# Patient Record
Sex: Male | Born: 1945
Health system: Southern US, Community
[De-identification: ages and names within clinical notes are randomized; demographics above are authoritative.]

## PROBLEM LIST (undated history)

## (undated) DIAGNOSIS — I5023 Acute on chronic systolic (congestive) heart failure: Secondary | ICD-10-CM

## (undated) DIAGNOSIS — F1910 Other psychoactive substance abuse, uncomplicated: Secondary | ICD-10-CM

## (undated) DIAGNOSIS — I779 Disorder of arteries and arterioles, unspecified: Secondary | ICD-10-CM

## (undated) DIAGNOSIS — N289 Disorder of kidney and ureter, unspecified: Secondary | ICD-10-CM

## (undated) DIAGNOSIS — N186 End stage renal disease: Secondary | ICD-10-CM

## (undated) DIAGNOSIS — C181 Malignant neoplasm of appendix: Secondary | ICD-10-CM

## (undated) DIAGNOSIS — E119 Type 2 diabetes mellitus without complications: Secondary | ICD-10-CM

## (undated) DIAGNOSIS — G4733 Obstructive sleep apnea (adult) (pediatric): Secondary | ICD-10-CM

## (undated) DIAGNOSIS — T4145XA Adverse effect of unspecified anesthetic, initial encounter: Secondary | ICD-10-CM

## (undated) DIAGNOSIS — I639 Cerebral infarction, unspecified: Secondary | ICD-10-CM

## (undated) DIAGNOSIS — C4490 Unspecified malignant neoplasm of skin, unspecified: Secondary | ICD-10-CM

## (undated) DIAGNOSIS — I429 Cardiomyopathy, unspecified: Secondary | ICD-10-CM

## (undated) DIAGNOSIS — T8859XA Other complications of anesthesia, initial encounter: Secondary | ICD-10-CM

## (undated) DIAGNOSIS — E785 Hyperlipidemia, unspecified: Secondary | ICD-10-CM

## (undated) DIAGNOSIS — I10 Essential (primary) hypertension: Secondary | ICD-10-CM

## (undated) DIAGNOSIS — G709 Myoneural disorder, unspecified: Secondary | ICD-10-CM

## (undated) DIAGNOSIS — G459 Transient cerebral ischemic attack, unspecified: Secondary | ICD-10-CM

## (undated) DIAGNOSIS — I739 Peripheral vascular disease, unspecified: Secondary | ICD-10-CM

## (undated) HISTORY — DX: Obstructive sleep apnea (adult) (pediatric): G47.33

## (undated) HISTORY — DX: Hyperlipidemia, unspecified: E78.5

## (undated) HISTORY — PX: OTHER SURGICAL HISTORY: SHX169

## (undated) HISTORY — DX: Cerebral infarction, unspecified: I63.9

## (undated) HISTORY — DX: Malignant neoplasm of appendix: C18.1

## (undated) HISTORY — DX: Other psychoactive substance abuse, uncomplicated: F19.10

## (undated) HISTORY — PX: CARDIAC CATHETERIZATION: SHX172

## (undated) HISTORY — DX: Disorder of arteries and arterioles, unspecified: I77.9

## (undated) HISTORY — DX: Peripheral vascular disease, unspecified: I73.9

## (undated) HISTORY — DX: End stage renal disease: N18.6

## (undated) HISTORY — DX: Essential (primary) hypertension: I10

## (undated) HISTORY — DX: Cardiomyopathy, unspecified: I42.9

## (undated) HISTORY — DX: Acute on chronic systolic (congestive) heart failure: I50.23

## (undated) HISTORY — DX: Unspecified malignant neoplasm of skin, unspecified: C44.90

## (undated) HISTORY — DX: Transient cerebral ischemic attack, unspecified: G45.9

---
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
## 2002-05-24 HISTORY — PX: OTHER SURGICAL HISTORY: SHX169

## 2004-05-24 DIAGNOSIS — C181 Malignant neoplasm of appendix: Secondary | ICD-10-CM

## 2004-05-24 HISTORY — DX: Malignant neoplasm of appendix: C18.1

## 2004-05-24 HISTORY — PX: RENAL CRYOABLATION: SHX2322

## 2004-06-09 ENCOUNTER — Inpatient Hospital Stay: Payer: Self-pay | Admitting: Urology

## 2005-03-08 ENCOUNTER — Ambulatory Visit: Payer: Self-pay | Admitting: Urology

## 2006-02-09 ENCOUNTER — Ambulatory Visit: Payer: Self-pay | Admitting: Urology

## 2006-05-24 LAB — HM COLONOSCOPY: HM Colonoscopy: NORMAL

## 2006-08-17 ENCOUNTER — Ambulatory Visit: Payer: Self-pay | Admitting: Gastroenterology

## 2007-03-17 ENCOUNTER — Ambulatory Visit: Payer: Self-pay | Admitting: Urology

## 2007-06-25 DIAGNOSIS — I739 Peripheral vascular disease, unspecified: Secondary | ICD-10-CM

## 2007-06-25 HISTORY — DX: Peripheral vascular disease, unspecified: I73.9

## 2007-07-04 ENCOUNTER — Ambulatory Visit: Payer: Self-pay | Admitting: Cardiovascular Disease

## 2007-07-14 ENCOUNTER — Ambulatory Visit: Payer: Self-pay | Admitting: Cardiovascular Disease

## 2008-03-21 ENCOUNTER — Ambulatory Visit: Payer: Self-pay | Admitting: Urology

## 2008-10-07 ENCOUNTER — Ambulatory Visit: Payer: Self-pay | Admitting: Urology

## 2009-08-29 ENCOUNTER — Ambulatory Visit: Payer: Self-pay | Admitting: Urology

## 2009-10-08 ENCOUNTER — Ambulatory Visit: Payer: Self-pay | Admitting: Pain Medicine

## 2011-04-18 ENCOUNTER — Other Ambulatory Visit: Payer: Self-pay | Admitting: Internal Medicine

## 2011-04-19 NOTE — Telephone Encounter (Signed)
Refills authorized  On amlodipine and lisinopril  #30 with 6 refills each

## 2011-04-22 ENCOUNTER — Other Ambulatory Visit: Payer: Self-pay | Admitting: Internal Medicine

## 2011-04-22 MED ORDER — LISINOPRIL 40 MG PO TABS: 40.0000 mg | ORAL_TABLET | Freq: Every day | ORAL | Status: DC

## 2011-05-06 ENCOUNTER — Encounter: Payer: Self-pay | Admitting: Internal Medicine

## 2011-05-06 ENCOUNTER — Ambulatory Visit (INDEPENDENT_AMBULATORY_CARE_PROVIDER_SITE_OTHER): Payer: Medicare Other | Admitting: Internal Medicine

## 2011-05-06 VITALS — BP 168/72 | HR 83 | Temp 98.2°F | Resp 16 | Ht 67.0 in | Wt 171.5 lb

## 2011-05-06 DIAGNOSIS — Z85528 Personal history of other malignant neoplasm of kidney: Secondary | ICD-10-CM | POA: Insufficient documentation

## 2011-05-06 DIAGNOSIS — Z7189 Other specified counseling: Secondary | ICD-10-CM | POA: Insufficient documentation

## 2011-05-06 DIAGNOSIS — I701 Atherosclerosis of renal artery: Secondary | ICD-10-CM | POA: Insufficient documentation

## 2011-05-06 DIAGNOSIS — Z79899 Other long term (current) drug therapy: Secondary | ICD-10-CM

## 2011-05-06 DIAGNOSIS — I10 Essential (primary) hypertension: Secondary | ICD-10-CM

## 2011-05-06 DIAGNOSIS — C649 Malignant neoplasm of unspecified kidney, except renal pelvis: Secondary | ICD-10-CM

## 2011-05-06 DIAGNOSIS — E785 Hyperlipidemia, unspecified: Secondary | ICD-10-CM

## 2011-05-06 DIAGNOSIS — N182 Chronic kidney disease, stage 2 (mild): Secondary | ICD-10-CM

## 2011-05-06 DIAGNOSIS — Z716 Tobacco abuse counseling: Secondary | ICD-10-CM

## 2011-05-06 DIAGNOSIS — I129 Hypertensive chronic kidney disease with stage 1 through stage 4 chronic kidney disease, or unspecified chronic kidney disease: Secondary | ICD-10-CM | POA: Insufficient documentation

## 2011-05-06 DIAGNOSIS — Z23 Encounter for immunization: Secondary | ICD-10-CM

## 2011-05-06 LAB — BASIC METABOLIC PANEL
BUN: 16 mg/dL (ref 6–23)
CO2: 25 mEq/L (ref 19–32)
Calcium: 9.3 mg/dL (ref 8.4–10.5)
Chloride: 107 mEq/L (ref 96–112)
Creatinine, Ser: 1.4 mg/dL (ref 0.4–1.5)
GFR: 53.1 mL/min — ABNORMAL LOW (ref >60.00)
Glucose, Bld: 93 mg/dL (ref 70–99)
Potassium: 4.2 mEq/L (ref 3.5–5.1)
Sodium: 138 mEq/L (ref 135–145)

## 2011-05-06 LAB — MICROALBUMIN / CREATININE URINE RATIO
Creatinine,U: 43.8 mg/dL
Microalb Creat Ratio: 46.2 mg/g — ABNORMAL HIGH (ref 0.0–30.0)
Microalb, Ur: 20.2 mg/dL — ABNORMAL HIGH (ref 0.0–1.9)

## 2011-05-06 MED ORDER — NEBIVOLOL HCL 5 MG PO TABS: 5.0000 mg | ORAL_TABLET | Freq: Every day | ORAL | Status: DC

## 2011-05-06 MED ORDER — ZOSTER VACCINE LIVE 19400 UNT/0.65ML ~~LOC~~ SOLR: 0.6500 mL | Freq: Once | SUBCUTANEOUS | Status: AC

## 2011-05-06 NOTE — Patient Instructions (Addendum)
We are adding Bystolic 5 mg daily to your current medications .Marland Kitchen  Continue amlodipine and lisinopril  .    Goal is bp < 130/80   Return in January for fasting labs and repeat bp check   You need to quit smoking to decrease your risks of recurrent cancer,  And heart attacks/strokes

## 2011-05-06 NOTE — Assessment & Plan Note (Signed)
His last fasting lipid panel noted low HDL and elevated tirglycerides but he deferred treatment.  Will repeat in one month and recommend fenofibrate if indicated,

## 2011-05-06 NOTE — Progress Notes (Signed)
  Subjective:    Patient ID: [name], male    DOB: [birth year], 65 y.o.   MRN: [id number]  HPI  [name] is a 65 yo white male with uncontrolled htn, surgically treated OSA by [facility] via [place], unclear history of renal artery stenosis and nd ongoing tobacco abuse presents for 6 month follow up on HTN .  He was seen as a new patient in June with bp of 210/102 after running of out amlodipine 3 weeks prior.  Was started on bystolic 10 mg at that time and per patient his blood pressure improved for a while. However he presents today with no recent use of bystolic but has been taking amlodiipne and lisinopril 40 mg daily  And recent systolics are in the [id number]. He denies chest pain, dyspnea, LE pain.     Review of Systems  Constitutional: Negative for fever, chills, diaphoresis, activity change, appetite change, fatigue and unexpected weight change.  HENT: Negative for hearing loss, ear pain, nosebleeds, congestion, sore throat, facial swelling, rhinorrhea, sneezing, drooling, mouth sores, trouble swallowing, neck pain, neck stiffness, dental problem, voice change, postnasal drip, sinus pressure, tinnitus and ear discharge.   Eyes: Negative for photophobia, pain, discharge, redness, itching and visual disturbance.  Respiratory: Negative for apnea, cough, choking, chest tightness, shortness of breath, wheezing and stridor.   Cardiovascular: Negative for chest pain, palpitations and leg swelling.  Gastrointestinal: Negative for nausea, vomiting, abdominal pain, diarrhea, constipation, blood in stool, abdominal distention, anal bleeding and rectal pain.  Genitourinary: Negative for dysuria, urgency, frequency, hematuria, flank pain, decreased urine volume, scrotal swelling, difficulty urinating and testicular pain.  Musculoskeletal: Negative for myalgias, back pain, joint swelling, arthralgias and gait problem.  Skin: Negative for color change, rash and wound.  Neurological: Positive for  light-headedness. Negative for dizziness, tremors, seizures, syncope, speech difficulty, weakness, numbness and headaches.  Psychiatric/Behavioral: Negative for suicidal ideas, hallucinations, behavioral problems, confusion, sleep disturbance, dysphoric mood, decreased concentration and agitation. The patient is not nervous/anxious.        Objective:   Physical Exam  Constitutional: He is oriented to person, place, and time.  HENT:  Head: Normocephalic and atraumatic.  Mouth/Throat: Oropharynx is clear and moist.  Eyes: Conjunctivae and EOM are normal.  Neck: Normal range of motion. Neck supple. No JVD present. No thyromegaly present.  Cardiovascular: Normal rate, regular rhythm and normal heart sounds.   Pulmonary/Chest: Effort normal and breath sounds normal. He has no wheezes. He has no rales.  Abdominal: Soft. Bowel sounds are normal. He exhibits no mass. There is no tenderness. There is no rebound.  Musculoskeletal: Normal range of motion. He exhibits no edema.  Neurological: He is alert and oriented to person, place, and time.  Skin: Skin is warm and dry.  Psychiatric: He has a normal mood and affect.          Assessment & Plan:

## 2011-05-06 NOTE — Assessment & Plan Note (Signed)
His kideny funciton is normal today while on lisinopril suggesting that if he does have RAS it is certainly not bilateral .  Will continue lisinopril given proteinuria since on UA today and and resume bystolic 5 mg daily.

## 2011-05-06 NOTE — Assessment & Plan Note (Addendum)
S/p left heminephrectomy in 2004, recurrence on the right side with cryoablation done in 2006.  His nephrologist is Dr. Elin[facility].  We did not have time to discuss when his last followup was  today but when he returns in one month we will repeat his UA and discuss local followup.

## 2011-05-14 ENCOUNTER — Encounter: Payer: Self-pay | Admitting: *Deleted

## 2011-06-11 ENCOUNTER — Ambulatory Visit: Payer: Medicare Other

## 2011-06-30 ENCOUNTER — Other Ambulatory Visit (INDEPENDENT_AMBULATORY_CARE_PROVIDER_SITE_OTHER): Payer: Medicare Other | Admitting: *Deleted

## 2011-06-30 DIAGNOSIS — E785 Hyperlipidemia, unspecified: Secondary | ICD-10-CM | POA: Diagnosis not present

## 2011-06-30 DIAGNOSIS — C649 Malignant neoplasm of unspecified kidney, except renal pelvis: Secondary | ICD-10-CM

## 2011-06-30 LAB — HEPATIC FUNCTION PANEL
ALT: 15 U/L (ref 0–53)
AST: 15 U/L (ref 0–37)
Albumin: 3.9 g/dL (ref 3.5–5.2)
Alkaline Phosphatase: 139 U/L — ABNORMAL HIGH (ref 39–117)
Bilirubin, Direct: 0 mg/dL (ref 0.0–0.3)
Total Bilirubin: 0.7 mg/dL (ref 0.3–1.2)
Total Protein: 7.4 g/dL (ref 6.0–8.3)

## 2011-06-30 LAB — LIPID PANEL
Cholesterol: 197 mg/dL (ref 0–200)
HDL: 24.8 mg/dL — ABNORMAL LOW (ref >39.00)
Total CHOL/HDL Ratio: 8
Triglycerides: 215 mg/dL — ABNORMAL HIGH (ref 0.0–149.0)
VLDL: 43 mg/dL — ABNORMAL HIGH (ref 0.0–40.0)

## 2011-06-30 LAB — LDL CHOLESTEROL, DIRECT: Direct LDL: 129.8 mg/dL

## 2011-07-01 LAB — URINALYSIS
Bilirubin Urine: NEGATIVE
Glucose, UA: NEGATIVE mg/dL
Hgb urine dipstick: NEGATIVE
Ketones, ur: NEGATIVE mg/dL
Leukocytes, UA: NEGATIVE
Nitrite: NEGATIVE
Protein, ur: 30 mg/dL — AB
Specific Gravity, Urine: 1.01 (ref 1.005–1.030)
Urobilinogen, UA: 0.2 mg/dL (ref 0.0–1.0)
pH: 6 (ref 5.0–8.0)

## 2011-08-12 ENCOUNTER — Encounter: Payer: Self-pay | Admitting: Internal Medicine

## 2011-09-27 ENCOUNTER — Ambulatory Visit: Payer: Medicare Other | Admitting: Internal Medicine

## 2011-09-30 ENCOUNTER — Other Ambulatory Visit (INDEPENDENT_AMBULATORY_CARE_PROVIDER_SITE_OTHER): Payer: Medicare Other | Admitting: *Deleted

## 2011-09-30 DIAGNOSIS — E785 Hyperlipidemia, unspecified: Secondary | ICD-10-CM | POA: Diagnosis not present

## 2011-09-30 LAB — LIPID PANEL
Cholesterol: 176 mg/dL (ref 0–200)
HDL: 28 mg/dL — ABNORMAL LOW (ref >39.00)
LDL Cholesterol: 115 mg/dL — ABNORMAL HIGH (ref 0–99)
Total CHOL/HDL Ratio: 6
Triglycerides: 167 mg/dL — ABNORMAL HIGH (ref 0.0–149.0)
VLDL: 33.4 mg/dL (ref 0.0–40.0)

## 2011-09-30 LAB — HEPATIC FUNCTION PANEL
ALT: 14 U/L (ref 0–53)
AST: 13 U/L (ref 0–37)
Albumin: 3.5 g/dL (ref 3.5–5.2)
Alkaline Phosphatase: 118 U/L — ABNORMAL HIGH (ref 39–117)
Bilirubin, Direct: 0 mg/dL (ref 0.0–0.3)
Total Bilirubin: 0.8 mg/dL (ref 0.3–1.2)
Total Protein: 6.8 g/dL (ref 6.0–8.3)

## 2011-10-30 ENCOUNTER — Other Ambulatory Visit: Payer: Self-pay | Admitting: Internal Medicine

## 2011-12-14 ENCOUNTER — Other Ambulatory Visit: Payer: Self-pay | Admitting: Internal Medicine

## 2011-12-15 ENCOUNTER — Other Ambulatory Visit: Payer: Self-pay | Admitting: Internal Medicine

## 2011-12-15 MED ORDER — LISINOPRIL 40 MG PO TABS: 40.0000 mg | ORAL_TABLET | Freq: Every day | ORAL | Status: DC

## 2011-12-23 DIAGNOSIS — G459 Transient cerebral ischemic attack, unspecified: Secondary | ICD-10-CM

## 2011-12-23 HISTORY — DX: Transient cerebral ischemic attack, unspecified: G45.9

## 2012-01-03 ENCOUNTER — Observation Stay: Payer: Self-pay | Admitting: Internal Medicine

## 2012-01-03 DIAGNOSIS — I6529 Occlusion and stenosis of unspecified carotid artery: Secondary | ICD-10-CM | POA: Diagnosis not present

## 2012-01-03 DIAGNOSIS — G459 Transient cerebral ischemic attack, unspecified: Secondary | ICD-10-CM | POA: Diagnosis not present

## 2012-01-03 DIAGNOSIS — F17200 Nicotine dependence, unspecified, uncomplicated: Secondary | ICD-10-CM | POA: Diagnosis not present

## 2012-01-03 DIAGNOSIS — I498 Other specified cardiac arrhythmias: Secondary | ICD-10-CM | POA: Diagnosis not present

## 2012-01-03 DIAGNOSIS — R5383 Other fatigue: Secondary | ICD-10-CM | POA: Diagnosis not present

## 2012-01-03 DIAGNOSIS — Z823 Family history of stroke: Secondary | ICD-10-CM | POA: Diagnosis not present

## 2012-01-03 DIAGNOSIS — I10 Essential (primary) hypertension: Secondary | ICD-10-CM | POA: Diagnosis not present

## 2012-01-03 DIAGNOSIS — Z7982 Long term (current) use of aspirin: Secondary | ICD-10-CM | POA: Diagnosis not present

## 2012-01-03 DIAGNOSIS — I6350 Cerebral infarction due to unspecified occlusion or stenosis of unspecified cerebral artery: Secondary | ICD-10-CM | POA: Diagnosis not present

## 2012-01-03 DIAGNOSIS — Z23 Encounter for immunization: Secondary | ICD-10-CM | POA: Diagnosis not present

## 2012-01-03 DIAGNOSIS — N179 Acute kidney failure, unspecified: Secondary | ICD-10-CM | POA: Diagnosis not present

## 2012-01-03 DIAGNOSIS — R209 Unspecified disturbances of skin sensation: Secondary | ICD-10-CM | POA: Diagnosis not present

## 2012-01-03 DIAGNOSIS — R5381 Other malaise: Secondary | ICD-10-CM | POA: Diagnosis not present

## 2012-01-03 LAB — COMPREHENSIVE METABOLIC PANEL
Albumin: 3.7 g/dL (ref 3.4–5.0)
Alkaline Phosphatase: 179 U/L — ABNORMAL HIGH (ref 50–136)
Anion Gap: 4 — ABNORMAL LOW (ref 7–16)
BUN: 13 mg/dL (ref 7–18)
Bilirubin,Total: 0.5 mg/dL (ref 0.2–1.0)
Calcium, Total: 8.9 mg/dL (ref 8.5–10.1)
Chloride: 109 mmol/L — ABNORMAL HIGH (ref 98–107)
Co2: 28 mmol/L (ref 21–32)
Creatinine: 1.57 mg/dL — ABNORMAL HIGH (ref 0.60–1.30)
EGFR (African American): 52 — ABNORMAL LOW
EGFR (Non-African Amer.): 45 — ABNORMAL LOW
Glucose: 105 mg/dL — ABNORMAL HIGH (ref 65–99)
Osmolality: 282 (ref 275–301)
Potassium: 4.1 mmol/L (ref 3.5–5.1)
SGOT(AST): 18 U/L (ref 15–37)
SGPT (ALT): 19 U/L (ref 12–78)
Sodium: 141 mmol/L (ref 136–145)
Total Protein: 8 g/dL (ref 6.4–8.2)

## 2012-01-03 LAB — URINALYSIS, COMPLETE
Bacteria: NONE SEEN
Bilirubin,UR: NEGATIVE
Blood: NEGATIVE
Glucose,UR: NEGATIVE mg/dL (ref 0–75)
Ketone: NEGATIVE
Leukocyte Esterase: NEGATIVE
Nitrite: NEGATIVE
Ph: 6 (ref 4.5–8.0)
Protein: NEGATIVE
RBC,UR: NONE SEEN /HPF (ref 0–5)
Specific Gravity: 1.004 (ref 1.003–1.030)
Squamous Epithelial: NONE SEEN
WBC UR: NONE SEEN /HPF (ref 0–5)

## 2012-01-03 LAB — CBC WITH DIFFERENTIAL/PLATELET
Basophil #: 0 10*3/uL (ref 0.0–0.1)
Basophil %: 0.4 %
Eosinophil #: 0.3 10*3/uL (ref 0.0–0.7)
Eosinophil %: 2.3 %
HCT: 39.4 % — ABNORMAL LOW (ref 40.0–52.0)
HGB: 13.8 g/dL (ref 13.0–18.0)
Lymphocyte #: 1.6 10*3/uL (ref 1.0–3.6)
Lymphocyte %: 13.7 %
MCH: 29.6 pg (ref 26.0–34.0)
MCHC: 35.1 g/dL (ref 32.0–36.0)
MCV: 84 fL (ref 80–100)
Monocyte #: 0.5 x10 3/mm (ref 0.2–1.0)
Monocyte %: 4.6 %
Neutrophil #: 9.2 10*3/uL — ABNORMAL HIGH (ref 1.4–6.5)
Neutrophil %: 79 %
Platelet: 220 10*3/uL (ref 150–440)
RBC: 4.67 10*6/uL (ref 4.40–5.90)
RDW: 13.6 % (ref 11.5–14.5)
WBC: 11.7 10*3/uL — ABNORMAL HIGH (ref 3.8–10.6)

## 2012-01-03 LAB — PROTIME-INR
INR: 0.9
Prothrombin Time: 12.4 secs (ref 11.5–14.7)

## 2012-01-03 LAB — TROPONIN I: Troponin-I: <0.02 ng/mL

## 2012-01-04 ENCOUNTER — Telehealth: Payer: Self-pay | Admitting: Internal Medicine

## 2012-01-04 DIAGNOSIS — N179 Acute kidney failure, unspecified: Secondary | ICD-10-CM | POA: Diagnosis not present

## 2012-01-04 DIAGNOSIS — I517 Cardiomegaly: Secondary | ICD-10-CM

## 2012-01-04 DIAGNOSIS — G459 Transient cerebral ischemic attack, unspecified: Secondary | ICD-10-CM | POA: Diagnosis not present

## 2012-01-04 DIAGNOSIS — I10 Essential (primary) hypertension: Secondary | ICD-10-CM | POA: Diagnosis not present

## 2012-01-04 DIAGNOSIS — F17200 Nicotine dependence, unspecified, uncomplicated: Secondary | ICD-10-CM | POA: Diagnosis not present

## 2012-01-04 LAB — BASIC METABOLIC PANEL
Anion Gap: 9 (ref 7–16)
BUN: 11 mg/dL (ref 7–18)
Calcium, Total: 8.6 mg/dL (ref 8.5–10.1)
Chloride: 110 mmol/L — ABNORMAL HIGH (ref 98–107)
Co2: 24 mmol/L (ref 21–32)
Creatinine: 1.34 mg/dL — ABNORMAL HIGH (ref 0.60–1.30)
EGFR (African American): >60
EGFR (Non-African Amer.): 55 — ABNORMAL LOW
Glucose: 101 mg/dL — ABNORMAL HIGH (ref 65–99)
Osmolality: 285 (ref 275–301)
Potassium: 3.8 mmol/L (ref 3.5–5.1)
Sodium: 143 mmol/L (ref 136–145)

## 2012-01-04 LAB — LIPID PANEL
Cholesterol: 159 mg/dL (ref 0–200)
HDL Cholesterol: 17 mg/dL — ABNORMAL LOW (ref 40–60)
Ldl Cholesterol, Calc: 98 mg/dL (ref 0–100)
Triglycerides: 218 mg/dL — ABNORMAL HIGH (ref 0–200)
VLDL Cholesterol, Calc: 44 mg/dL — ABNORMAL HIGH (ref 5–40)

## 2012-01-04 NOTE — Telephone Encounter (Signed)
[facility] hospital discharge 01/04/12  Follow up appointment 01/14/12

## 2012-01-04 NOTE — Telephone Encounter (Signed)
Left message asking patient to return my call.

## 2012-01-05 NOTE — Telephone Encounter (Signed)
Patient called back, he stated he is feeling a little better everyday since being discharged.  He was advised to bring his medications and discharge summary from the hospital to the appt. And will call the office if he needs anything until then.

## 2012-01-14 ENCOUNTER — Ambulatory Visit (INDEPENDENT_AMBULATORY_CARE_PROVIDER_SITE_OTHER): Payer: Medicare Other | Admitting: Internal Medicine

## 2012-01-14 ENCOUNTER — Ambulatory Visit (INDEPENDENT_AMBULATORY_CARE_PROVIDER_SITE_OTHER)
Admission: RE | Admit: 2012-01-14 | Discharge: 2012-01-14 | Disposition: A | Discharge status: Home / Self Care | Payer: Medicare Other | Source: Ambulatory Visit | Attending: Internal Medicine | Admitting: Internal Medicine

## 2012-01-14 ENCOUNTER — Encounter: Payer: Self-pay | Admitting: Internal Medicine

## 2012-01-14 VITALS — BP 140/68 | HR 64 | Temp 97.9°F | Resp 14 | Wt 155.8 lb

## 2012-01-14 DIAGNOSIS — M549 Dorsalgia, unspecified: Secondary | ICD-10-CM

## 2012-01-14 DIAGNOSIS — M25552 Pain in left hip: Secondary | ICD-10-CM

## 2012-01-14 DIAGNOSIS — I10 Essential (primary) hypertension: Secondary | ICD-10-CM

## 2012-01-14 DIAGNOSIS — M545 Low back pain, unspecified: Secondary | ICD-10-CM | POA: Diagnosis not present

## 2012-01-14 DIAGNOSIS — G459 Transient cerebral ischemic attack, unspecified: Secondary | ICD-10-CM

## 2012-01-14 DIAGNOSIS — I739 Peripheral vascular disease, unspecified: Secondary | ICD-10-CM

## 2012-01-14 DIAGNOSIS — E785 Hyperlipidemia, unspecified: Secondary | ICD-10-CM

## 2012-01-14 DIAGNOSIS — Z79899 Other long term (current) drug therapy: Secondary | ICD-10-CM

## 2012-01-14 DIAGNOSIS — M169 Osteoarthritis of hip, unspecified: Secondary | ICD-10-CM | POA: Diagnosis not present

## 2012-01-14 DIAGNOSIS — M25559 Pain in unspecified hip: Secondary | ICD-10-CM | POA: Diagnosis not present

## 2012-01-14 DIAGNOSIS — R29898 Other symptoms and signs involving the musculoskeletal system: Secondary | ICD-10-CM

## 2012-01-14 NOTE — Progress Notes (Signed)
Patient ID: [name], male   DOB: 1945/12/13, 66 y.o.   MRN: [id number]  Patient Active Problem List  Diagnosis  . Hypertension goal BP (blood pressure) < 130/80  . Hyperlipidemia  . Renal cell carcinoma  . Tobacco abuse counseling  . Claudication of gluteal region  . TIA (transient ischemic attack)    Subjective:  CC:   Chief Complaint  Patient presents with  . Follow-up    hospital    HPI:   [name] a 66 y.o. male who presents for hospital followup discharged 8/13 from TIA admission . symptoms were left facial numbness,  No weakness in face and arm.  But left foot and leg became suddenly numb and weak.  MRI not done because of metal in head.  CT head was normal.  carotids and ECHO were noncontributory.  Feels poorly since then,  Had a repeat episodes of left leg weakness occurring the same way 3 days ago while mowing grass. Symptoms improve with rest.     Past Medical History  Diagnosis Date  . Migraine     cluster  . Obstructive sleep apnea   . Hyperlipidemia   . Hypertension   . Renal cell carcinoma 2004    left kidney heminephrectomy  . tobacco abuse     Past Surgical History  Procedure Date  . Heminephrectomy 2004    for renal cell CA         The following portions of the patient's history were reviewed and updated as appropriate: Allergies, current medications, and problem list.    Review of Systems:   12 Pt  review of systems was negative except those addressed in the HPI,     History   Social History  . Marital Status: Married    Spouse Name: N/A    Number of Children: N/A  . Years of Education: N/A   Occupational History  . Not on file.   Social History Main Topics  . Smoking status: Current Everyday Smoker -- 1.5 packs/day    Types: Cigarettes  . Smokeless tobacco: Never Used  . Alcohol Use: Yes  . Drug Use: No  . Sexually Active: Not on file   Other Topics Concern  . Not on file   Social History Narrative  .  No narrative on file    Objective:  BP 140/68  Pulse 64  Temp 97.9 F (36.6 C) (Oral)  Resp 14  Wt 155 lb 12 oz (70.648 kg)  SpO2 98%  General appearance: alert, cooperative and appears stated age Ears: normal TM's and external ear canals both ears Throat: lips, mucosa, and tongue normal; teeth and gums normal Neck: no adenopathy, no carotid bruit, supple, symmetrical, trachea midline and thyroid not enlarged, symmetric, no tenderness/mass/nodules Back: symmetric, no curvature. ROM normal. No CVA tenderness. Lungs: clear to auscultation bilaterally Heart: regular rate and rhythm, S1, S2 normal, no murmur, click, rub or gallop Abdomen: soft, non-tender; bowel sounds normal; no masses,  no organomegaly Pulses: 2+ and symmetric Skin: Skin color, texture, turgor normal. No rashes or lesions Lymph nodes: Cervical, supraclavicular, and axillary nodes normal.  Assessment and Plan:  Hypertension goal BP (blood pressure) < 130/80 Home bps have been 115 to 130 / 60.  NO changes today.   Claudication of gluteal region Suspected, given symptoms of leg weakness with exertion. Referral to [facility] for evaluation of lower extremity claudication symptoms.   Hyperlipidemia He was prescribed a statin during recent admission for TIA symptoms.  TIA (transient ischemic attack) Recent admission for left leg weakness with negative workup thus far. MRI not done due to history of metal in his eye.  Symptoms have been recurrent, brought on with exercise.  Continue aspirin and statin.    Updated Medication List Outpatient Encounter Prescriptions as of 01/14/2012  Medication Sig Dispense Refill  . amLODipine (NORVASC) 10 MG tablet TAKE 1 TABLET BY MOUTH DAILY  30 tablet  5  . aspirin 325 MG tablet Take 325 mg by mouth daily.      Marland Kitchen atorvastatin (LIPITOR) 10 MG tablet Take 10 mg by mouth daily.      . hydrochlorothiazide (HYDRODIURIL) 25 MG tablet Take 25 mg by mouth daily.      Marland Kitchen lisinopril  (PRINIVIL,ZESTRIL) 40 MG tablet Take 1 tablet (40 mg total) by mouth daily.  90 tablet  3  . DISCONTD: nebivolol (BYSTOLIC) 5 MG tablet Take 1 tablet (5 mg total) by mouth daily.  30 tablet  5     Orders Placed This Encounter  Procedures  . DG Hip Complete Left  . DG Lumbar Spine Complete  . Lipid panel  . Comprehensive metabolic panel  . Ambulatory referral to Vascular Surgery    Return in about 6 weeks (around 02/25/2012).

## 2012-01-14 NOTE — Assessment & Plan Note (Addendum)
Home bps have been 115 to 130 / 60.  NO changes today.

## 2012-01-14 NOTE — Patient Instructions (Addendum)
I am referring you for x rays of hip and spine ,  And a vascular evaluation of your  left leg   Contineu aspirn and atorvastaitn.   Return in 6 weeks for fasting labs. (water or black coffee ok)

## 2012-01-15 DIAGNOSIS — I739 Peripheral vascular disease, unspecified: Secondary | ICD-10-CM | POA: Insufficient documentation

## 2012-01-15 DIAGNOSIS — I6782 Cerebral ischemia: Secondary | ICD-10-CM | POA: Insufficient documentation

## 2012-01-15 DIAGNOSIS — G459 Transient cerebral ischemic attack, unspecified: Secondary | ICD-10-CM | POA: Insufficient documentation

## 2012-01-15 NOTE — Assessment & Plan Note (Signed)
Suspected, given symptoms of leg weakness with exertion. Referral to [facility] for evaluation of lower extremity claudication symptoms.

## 2012-01-15 NOTE — Assessment & Plan Note (Signed)
Recent admission for left leg weakness with negative workup thus far. MRI not done due to history of metal in his eye.  Symptoms have been recurrent, brought on with exercise.  Continue aspirin and statin.

## 2012-01-15 NOTE — Assessment & Plan Note (Signed)
He was prescribed a statin during recent admission for TIA symptoms.

## 2012-01-19 ENCOUNTER — Telehealth: Payer: Self-pay | Admitting: *Deleted

## 2012-01-19 NOTE — Telephone Encounter (Signed)
Correction: he has an orthopedic issue as well and may be efit from a steroid injection in the hip, but I would wait until we see about the circulation . And yes we can send the report to Margaretmary Eddy in advance.

## 2012-01-19 NOTE — Telephone Encounter (Signed)
Patients spouse called stating that patient has seen Dr. Margaretmary Eddy with [facility] and he would like to go back to him.  They will call to schedule their own appt if we can just fax a copy of the xray report to them.  Please advise.

## 2012-01-19 NOTE — Telephone Encounter (Signed)
I have referred him to vascular surgery,  Not orthopedic surgery to evaluate his circulation.

## 2012-01-25 DIAGNOSIS — M76899 Other specified enthesopathies of unspecified lower limb, excluding foot: Secondary | ICD-10-CM | POA: Diagnosis not present

## 2012-01-25 NOTE — Telephone Encounter (Signed)
Left message asking patient to return my call.

## 2012-01-26 NOTE — Telephone Encounter (Signed)
Patient was notified by message.

## 2012-02-04 DIAGNOSIS — M549 Dorsalgia, unspecified: Secondary | ICD-10-CM

## 2012-02-04 DIAGNOSIS — E785 Hyperlipidemia, unspecified: Secondary | ICD-10-CM

## 2012-02-04 DIAGNOSIS — R29898 Other symptoms and signs involving the musculoskeletal system: Secondary | ICD-10-CM

## 2012-02-04 DIAGNOSIS — I10 Essential (primary) hypertension: Secondary | ICD-10-CM

## 2012-02-04 DIAGNOSIS — G459 Transient cerebral ischemic attack, unspecified: Secondary | ICD-10-CM

## 2012-02-04 DIAGNOSIS — I739 Peripheral vascular disease, unspecified: Secondary | ICD-10-CM

## 2012-02-04 DIAGNOSIS — M25559 Pain in unspecified hip: Secondary | ICD-10-CM

## 2012-02-04 DIAGNOSIS — Z79899 Other long term (current) drug therapy: Secondary | ICD-10-CM

## 2012-02-10 DIAGNOSIS — I739 Peripheral vascular disease, unspecified: Secondary | ICD-10-CM | POA: Diagnosis not present

## 2012-02-10 DIAGNOSIS — M1990 Unspecified osteoarthritis, unspecified site: Secondary | ICD-10-CM | POA: Diagnosis not present

## 2012-02-10 DIAGNOSIS — G459 Transient cerebral ischemic attack, unspecified: Secondary | ICD-10-CM | POA: Diagnosis not present

## 2012-02-10 DIAGNOSIS — I70219 Atherosclerosis of native arteries of extremities with intermittent claudication, unspecified extremity: Secondary | ICD-10-CM | POA: Diagnosis not present

## 2012-02-10 DIAGNOSIS — M79609 Pain in unspecified limb: Secondary | ICD-10-CM | POA: Diagnosis not present

## 2012-02-16 ENCOUNTER — Ambulatory Visit: Payer: Self-pay | Admitting: Vascular Surgery

## 2012-02-16 DIAGNOSIS — I6529 Occlusion and stenosis of unspecified carotid artery: Secondary | ICD-10-CM | POA: Diagnosis not present

## 2012-02-16 DIAGNOSIS — G459 Transient cerebral ischemic attack, unspecified: Secondary | ICD-10-CM | POA: Diagnosis not present

## 2012-02-28 ENCOUNTER — Other Ambulatory Visit (INDEPENDENT_AMBULATORY_CARE_PROVIDER_SITE_OTHER): Payer: Medicare Other

## 2012-02-28 DIAGNOSIS — E785 Hyperlipidemia, unspecified: Secondary | ICD-10-CM | POA: Diagnosis not present

## 2012-02-28 DIAGNOSIS — Z79899 Other long term (current) drug therapy: Secondary | ICD-10-CM | POA: Diagnosis not present

## 2012-02-28 LAB — LIPID PANEL
Cholesterol: 183 mg/dL (ref 0–200)
HDL: 25 mg/dL — ABNORMAL LOW (ref >39.00)
Total CHOL/HDL Ratio: 7
Triglycerides: 239 mg/dL — ABNORMAL HIGH (ref 0.0–149.0)
VLDL: 47.8 mg/dL — ABNORMAL HIGH (ref 0.0–40.0)

## 2012-02-28 LAB — COMPREHENSIVE METABOLIC PANEL
ALT: 14 U/L (ref 0–53)
AST: 13 U/L (ref 0–37)
Albumin: 3.5 g/dL (ref 3.5–5.2)
Alkaline Phosphatase: 119 U/L — ABNORMAL HIGH (ref 39–117)
BUN: 19 mg/dL (ref 6–23)
CO2: 27 mEq/L (ref 19–32)
Calcium: 9.3 mg/dL (ref 8.4–10.5)
Chloride: 107 mEq/L (ref 96–112)
Creatinine, Ser: 1.7 mg/dL — ABNORMAL HIGH (ref 0.4–1.5)
GFR: 43.62 mL/min — ABNORMAL LOW (ref >60.00)
Glucose, Bld: 95 mg/dL (ref 70–99)
Potassium: 5.2 mEq/L — ABNORMAL HIGH (ref 3.5–5.1)
Sodium: 139 mEq/L (ref 135–145)
Total Bilirubin: 0.4 mg/dL (ref 0.3–1.2)
Total Protein: 7.2 g/dL (ref 6.0–8.3)

## 2012-02-28 LAB — LDL CHOLESTEROL, DIRECT: Direct LDL: 107.5 mg/dL

## 2012-02-29 ENCOUNTER — Other Ambulatory Visit: Payer: Self-pay | Admitting: Internal Medicine

## 2012-02-29 ENCOUNTER — Other Ambulatory Visit: Payer: Self-pay

## 2012-02-29 DIAGNOSIS — I6529 Occlusion and stenosis of unspecified carotid artery: Secondary | ICD-10-CM | POA: Diagnosis not present

## 2012-02-29 DIAGNOSIS — I70219 Atherosclerosis of native arteries of extremities with intermittent claudication, unspecified extremity: Secondary | ICD-10-CM | POA: Diagnosis not present

## 2012-02-29 MED ORDER — HYDROCHLOROTHIAZIDE 25 MG PO TABS: 25.0000 mg | ORAL_TABLET | Freq: Every day | ORAL | Status: DC

## 2012-02-29 MED ORDER — ATORVASTATIN CALCIUM 10 MG PO TABS: 10.0000 mg | ORAL_TABLET | Freq: Every day | ORAL | Status: DC

## 2012-02-29 NOTE — Telephone Encounter (Signed)
Refill request for Lipitor 10 mg and Hydrochlorothiazide 25 mg. Ok to refill?

## 2012-03-02 ENCOUNTER — Other Ambulatory Visit (INDEPENDENT_AMBULATORY_CARE_PROVIDER_SITE_OTHER): Payer: Medicare Other

## 2012-03-02 DIAGNOSIS — E785 Hyperlipidemia, unspecified: Secondary | ICD-10-CM | POA: Diagnosis not present

## 2012-03-02 LAB — BASIC METABOLIC PANEL
BUN: 17 mg/dL (ref 6–23)
CO2: 28 mEq/L (ref 19–32)
Calcium: 9 mg/dL (ref 8.4–10.5)
Chloride: 107 mEq/L (ref 96–112)
Creatinine, Ser: 1.6 mg/dL — ABNORMAL HIGH (ref 0.4–1.5)
GFR: 45.49 mL/min — ABNORMAL LOW (ref >60.00)
Glucose, Bld: 101 mg/dL — ABNORMAL HIGH (ref 70–99)
Potassium: 4.1 mEq/L (ref 3.5–5.1)
Sodium: 139 mEq/L (ref 135–145)

## 2012-03-15 ENCOUNTER — Telehealth: Payer: Self-pay | Admitting: Internal Medicine

## 2012-03-15 NOTE — Telephone Encounter (Signed)
The vascular studies on his legs were notable for atherosclerosis but no signs of significant blockages.  He

## 2012-03-20 ENCOUNTER — Encounter: Payer: Self-pay | Admitting: Internal Medicine

## 2012-04-19 ENCOUNTER — Encounter: Payer: Self-pay | Admitting: Internal Medicine

## 2012-04-19 ENCOUNTER — Ambulatory Visit (INDEPENDENT_AMBULATORY_CARE_PROVIDER_SITE_OTHER): Payer: Medicare Other | Admitting: Internal Medicine

## 2012-04-19 VITALS — BP 200/110 | HR 74 | Temp 97.7°F | Resp 12 | Ht 66.0 in | Wt 164.0 lb

## 2012-04-19 DIAGNOSIS — N289 Disorder of kidney and ureter, unspecified: Secondary | ICD-10-CM

## 2012-04-19 DIAGNOSIS — Z716 Tobacco abuse counseling: Secondary | ICD-10-CM

## 2012-04-19 DIAGNOSIS — N183 Chronic kidney disease, stage 3 unspecified: Secondary | ICD-10-CM

## 2012-04-19 DIAGNOSIS — Z7189 Other specified counseling: Secondary | ICD-10-CM

## 2012-04-19 DIAGNOSIS — M25559 Pain in unspecified hip: Secondary | ICD-10-CM

## 2012-04-19 DIAGNOSIS — F1910 Other psychoactive substance abuse, uncomplicated: Secondary | ICD-10-CM

## 2012-04-19 DIAGNOSIS — E785 Hyperlipidemia, unspecified: Secondary | ICD-10-CM

## 2012-04-19 DIAGNOSIS — I10 Essential (primary) hypertension: Secondary | ICD-10-CM

## 2012-04-19 DIAGNOSIS — F17200 Nicotine dependence, unspecified, uncomplicated: Secondary | ICD-10-CM

## 2012-04-19 DIAGNOSIS — G459 Transient cerebral ischemic attack, unspecified: Secondary | ICD-10-CM

## 2012-04-19 DIAGNOSIS — N529 Male erectile dysfunction, unspecified: Secondary | ICD-10-CM

## 2012-04-19 DIAGNOSIS — M25552 Pain in left hip: Secondary | ICD-10-CM

## 2012-04-19 LAB — BASIC METABOLIC PANEL
BUN: 17 mg/dL (ref 6–23)
CO2: 25 mEq/L (ref 19–32)
Calcium: 9.2 mg/dL (ref 8.4–10.5)
Chloride: 105 mEq/L (ref 96–112)
Creatinine, Ser: 1.5 mg/dL (ref 0.4–1.5)
GFR: 49.7 mL/min — ABNORMAL LOW (ref >60.00)
Glucose, Bld: 119 mg/dL — ABNORMAL HIGH (ref 70–99)
Potassium: 3.9 mEq/L (ref 3.5–5.1)
Sodium: 138 mEq/L (ref 135–145)

## 2012-04-19 MED ORDER — ATORVASTATIN CALCIUM 20 MG PO TABS: 20.0000 mg | ORAL_TABLET | Freq: Every day | ORAL | Status: DC

## 2012-04-19 MED ORDER — LOSARTAN POTASSIUM 50 MG PO TABS: 50.0000 mg | ORAL_TABLET | Freq: Every day | ORAL | Status: DC

## 2012-04-19 NOTE — Assessment & Plan Note (Addendum)
Asymptomatic. Uncontrolled today by my recheck on amlodipine and lisinopril since HCTZ was discontinued. I am  adding losartan 50 mg daily.  He will return in one week for repeat blood pressure check. Given his known peripheral vascular disease, We will consider rule out renal artery stenosis if blood pressure is not improving.

## 2012-04-19 NOTE — Progress Notes (Signed)
Patient ID: [name], male   DOB: [birth year], 66 y.o.   MRN: [id number]  Patient Active Problem List  Diagnosis  . Hypertension goal BP (blood pressure) < 130/80  . Hyperlipidemia  . Renal cell carcinoma  . Tobacco abuse counseling  . TIA (transient ischemic attack)  . tobacco abuse  . Left hip pain  . Kidney disease, chronic, stage III (GFR 30-59 ml/min)  . Erectile dysfunction    Subjective:  CC:   Chief Complaint  Patient presents with  . Follow-up    HPI:   [name] is a 66 y.o. male who presents for follow up on multiple issues including hypertension, PAD, CKD  and tobacco abuse.  His left leg weakness was presumed to be due to TIA vs CVA per recent [facility] admission.  Has had a vascular evaluation of carotids and LE circulation Carotid dopplers showed 50% stenosis,  MRI not done bc of metal in eye.  ABIs were normal per [facility]. But were followed (per [facility] noted) with an aortic duplex , results not available.   He underwent a steroid injection left hip by Margaretmary Eddy which improved his left hip pain by 75%. 2) His HCTZ was stopped several weesk ago due to worsening renal function and he returns today for BP check and renal function assessment.  3) Requesting a penile vacuum pump for management of ED.  He has history of intolerance to viagra due to severe headaches, and has tried cialis with not improvement in ED.  Does not want to see a urologist/.    Past Medical History  Diagnosis Date  . Migraine     cluster  . Obstructive sleep apnea   . Hyperlipidemia   . Hypertension   . Renal cell carcinoma 2004    left kidney heminephrectomy  . TIA (transient ischemic attack) Aug 2013  . tobacco abuse     Past Surgical History  Procedure Date  . Heminephrectomy 2004    for renal cell CA         The following portions of the patient's history were reviewed and updated as appropriate: Allergies, current medications, and problem list.    Review of Systems:   Patient denies headache, fevers, malaise, unintentional weight loss, skin rash, eye pain, sinus congestion and sinus pain, sore throat, dysphagia,  hemoptysis , cough, dyspnea, wheezing, chest pain, palpitations, orthopnea, edema, abdominal pain, nausea, melena, diarrhea, constipation, flank pain, dysuria, hematuria, urinary  Frequency, nocturia, numbness, tingling, seizures,  Focal weakness, Loss of consciousness,  Tremor, insomnia, depression, anxiety, and suicidal ideation.         History   Social History  . Marital Status: Married    Spouse Name: N/A    Number of Children: N/A  . Years of Education: N/A   Occupational History  . Not on file.   Social History Main Topics  . Smoking status: Current Every Day Smoker -- 1.5 packs/day    Types: Cigarettes  . Smokeless tobacco: Never Used  . Alcohol Use: Yes  . Drug Use: No  . Sexually Active: Not on file   Other Topics Concern  . Not on file   Social History Narrative  . No narrative on file    Objective:  BP 200/110  Pulse 74  Temp 97.7 F (36.5 C) (Oral)  Resp 12  Ht 5\' 6"  (1.676 m)  Wt 164 lb (74.39 kg)  BMI 26.47 kg/m2  SpO2 97%  General appearance: alert, cooperative and appears stated  age Ears: normal TM's and external ear canals both ears Throat: lips, mucosa, and tongue normal; teeth and gums normal Neck: no adenopathy, no carotid bruit, supple, symmetrical, trachea midline and thyroid not enlarged, symmetric, no tenderness/mass/nodules Back: symmetric, no curvature. ROM normal. No CVA tenderness. Lungs: clear to auscultation bilaterally Heart: regular rate and rhythm, S1, S2 normal, no murmur, click, rub or gallop Abdomen: soft, non-tender; bowel sounds normal; no masses,  no organomegaly Pulses: 2+ and symmetric Skin: Skin color, texture, turgor normal. No rashes or lesions Lymph nodes: Cervical, supraclavicular, and axillary nodes normal.  Assessment and Plan:  Hypertension goal BP (blood  pressure) < 130/80 Asymptomatic. Uncontrolled today by my recheck on amlodipine and lisinopril since HCTZ was discontinued. I am  adding losartan 50 mg daily.  He will return in one week for repeat blood pressure check. Given his known peripheral vascular disease, We will consider rule out renal artery stenosis if blood pressure is not improving.  Tobacco abuse counseling He has reduced his consumption since last visit but continues to smoke daily.  tobacco abuse Lifelong, with recent reduction due to counseling. However patient continues to smoke on a daily basis. Advice given again to quit.  TIA (transient ischemic attack) With recent admission for left-sided weakness. However MRI was not able to be obtained because he has a history of metal in his eye. CT was negative carotid Dopplers were negative. Repeat vascular valuation does show diffuse peripheral vascular disease but not severe enough to warrant intervention.  Hyperlipidemia With dyslipidemia including low HDL. LDL is not at goal given her known peripheral vascular disease. Will increase his dose to 20 mg daily and continue aspirin.  smoking cessation advised.  Left hip pain Symptoms were presumed due to claudication given his tobacco history and recent TIA. Plain films showed mild degenerative changes. Vascular evaluation showed an nonocclusive disease. Still waiting on the last aortic duplex report. He did have significant relief of pain with steroid injection done by orthopedics recently.  Kidney disease, chronic, stage III (GFR 30-59 ml/min) Slight improvement noted with cessation of hydrochlorothiazide. However his blood pressure is not well controlled. Adding losartan today. When patient returns next week for blood pressure check we will repeat his kidney function. Referral to Nephrology needed/   Erectile dysfunction He is requesting prescription for a penile pump without urologic evaluation. I'm not sure this is possible. I'm  also concerned that his erectile dysfunction since it did not respond to use of Viagra, may be due to arterial insufficiency.   Updated Medication List Outpatient Encounter Prescriptions as of 04/19/2012  Medication Sig Dispense Refill  . amLODipine (NORVASC) 10 MG tablet TAKE 1 TABLET BY MOUTH DAILY  30 tablet  5  . aspirin 325 MG tablet Take 325 mg by mouth daily.      Marland Kitchen atorvastatin (LIPITOR) 20 MG tablet Take 1 tablet (20 mg total) by mouth daily.  90 tablet  0  . lisinopril (PRINIVIL,ZESTRIL) 40 MG tablet Take 1 tablet (40 mg total) by mouth daily.  90 tablet  3  . [DISCONTINUED] atorvastatin (LIPITOR) 10 MG tablet Take 1 tablet (10 mg total) by mouth daily.  90 tablet  0  . losartan (COZAAR) 50 MG tablet Take 1 tablet (50 mg total) by mouth daily.  90 tablet  3  . [DISCONTINUED] hydrochlorothiazide (HYDRODIURIL) 25 MG tablet Take 1 tablet (25 mg total) by mouth daily.  90 tablet  3     Orders Placed This Encounter  Procedures  .  Basic metabolic panel    No Follow-up on file.

## 2012-04-19 NOTE — Patient Instructions (Addendum)
We are adding a blood pressure medication to your lisinopril and your amloidipine called losartanm.  Take it once daily  50 mg dose    Return in one week for repeat BP check  We are doubling the dose of atorvastatin for cholesterol,  To 20 mg at bedtime

## 2012-04-20 ENCOUNTER — Encounter: Payer: Self-pay | Admitting: Internal Medicine

## 2012-04-20 DIAGNOSIS — M25559 Pain in unspecified hip: Secondary | ICD-10-CM | POA: Insufficient documentation

## 2012-04-20 DIAGNOSIS — N529 Male erectile dysfunction, unspecified: Secondary | ICD-10-CM | POA: Insufficient documentation

## 2012-04-20 DIAGNOSIS — N183 Chronic kidney disease, stage 3 unspecified: Secondary | ICD-10-CM | POA: Insufficient documentation

## 2012-04-20 DIAGNOSIS — M25552 Pain in left hip: Secondary | ICD-10-CM | POA: Insufficient documentation

## 2012-04-20 DIAGNOSIS — F1910 Other psychoactive substance abuse, uncomplicated: Secondary | ICD-10-CM | POA: Insufficient documentation

## 2012-04-20 NOTE — Assessment & Plan Note (Signed)
Lifelong, with recent reduction due to counseling. However patient continues to smoke on a daily basis. Advice given again to quit.

## 2012-04-20 NOTE — Assessment & Plan Note (Signed)
Slight improvement noted with cessation of hydrochlorothiazide. However his blood pressure is not well controlled. Adding losartan today. When patient returns next week for blood pressure check we will repeat his kidney function. Referral to Nephrology needed/

## 2012-04-20 NOTE — Assessment & Plan Note (Signed)
He is requesting prescription for a penile pump without urologic evaluation. I'm not sure this is possible. I'm also concerned that his erectile dysfunction since it did not respond to use of Viagra, may be due to arterial insufficiency.

## 2012-04-20 NOTE — Assessment & Plan Note (Addendum)
With dyslipidemia including low HDL. LDL is not at goal given her known peripheral vascular disease. Will increase his dose to 20 mg daily and continue aspirin.  smoking cessation advised.

## 2012-04-20 NOTE — Assessment & Plan Note (Signed)
He has reduced his consumption since last visit but continues to smoke daily.

## 2012-04-20 NOTE — Assessment & Plan Note (Signed)
With recent admission for left-sided weakness. However MRI was not able to be obtained because he has a history of metal in his eye. CT was negative carotid Dopplers were negative. Repeat vascular valuation does show diffuse peripheral vascular disease but not severe enough to warrant intervention.

## 2012-04-20 NOTE — Assessment & Plan Note (Signed)
Symptoms were presumed due to claudication given his tobacco history and recent TIA. Plain films showed mild degenerative changes. Vascular evaluation showed an nonocclusive disease. Still waiting on the last aortic duplex report. He did have significant relief of pain with steroid injection done by orthopedics recently.

## 2012-04-22 ENCOUNTER — Other Ambulatory Visit: Payer: Self-pay | Admitting: Internal Medicine

## 2012-04-23 NOTE — Telephone Encounter (Signed)
Please let [name] know that I checked into the pump that he asked me to rder for him, and I cannot do it; it has to be done by a urologist because there are different kinds and they are not easy to figure out on their own.  There is a new urologist who would be happy to see him; his office is in [place] and he is very nice,  Dr. Juline Patch , [facility] .  thir office number is [phone].Marland KitchenOffer to refer him for Erectile dysfunction.   i will refill the cialis in the meantime

## 2012-04-24 NOTE — Telephone Encounter (Signed)
Spoke to patient gave him directions as instructed.

## 2012-04-28 ENCOUNTER — Ambulatory Visit (INDEPENDENT_AMBULATORY_CARE_PROVIDER_SITE_OTHER): Payer: Medicare Other | Admitting: Internal Medicine

## 2012-04-28 VITALS — BP 150/76 | HR 70

## 2012-04-28 DIAGNOSIS — I10 Essential (primary) hypertension: Secondary | ICD-10-CM

## 2012-04-30 NOTE — Assessment & Plan Note (Addendum)
Improved but not at goal.  He has proteinuria by dipstick UA ,  Is takign lisinopril, losartan and amlodipine.  Will increase the losartan to 100 mg daily at next visit if not < 130/80

## 2012-04-30 NOTE — Progress Notes (Signed)
Patient ID: [name], male   DOB: 29-Oct-1945, 66 y.o.   MRN: [id number] Patient is here for RN visit to recheck bp

## 2012-05-23 ENCOUNTER — Encounter: Payer: Self-pay | Admitting: Internal Medicine

## 2012-05-23 ENCOUNTER — Ambulatory Visit (INDEPENDENT_AMBULATORY_CARE_PROVIDER_SITE_OTHER): Payer: Medicare Other | Admitting: Internal Medicine

## 2012-05-23 VITALS — BP 157/84 | HR 62 | Temp 98.5°F | Resp 12 | Ht 67.0 in | Wt 166.0 lb

## 2012-05-23 DIAGNOSIS — Z79899 Other long term (current) drug therapy: Secondary | ICD-10-CM | POA: Diagnosis not present

## 2012-05-23 DIAGNOSIS — R748 Abnormal levels of other serum enzymes: Secondary | ICD-10-CM

## 2012-05-23 DIAGNOSIS — I10 Essential (primary) hypertension: Secondary | ICD-10-CM | POA: Diagnosis not present

## 2012-05-23 DIAGNOSIS — J069 Acute upper respiratory infection, unspecified: Secondary | ICD-10-CM

## 2012-05-23 DIAGNOSIS — E785 Hyperlipidemia, unspecified: Secondary | ICD-10-CM

## 2012-05-23 DIAGNOSIS — I739 Peripheral vascular disease, unspecified: Secondary | ICD-10-CM

## 2012-05-23 DIAGNOSIS — I129 Hypertensive chronic kidney disease with stage 1 through stage 4 chronic kidney disease, or unspecified chronic kidney disease: Secondary | ICD-10-CM

## 2012-05-23 DIAGNOSIS — N183 Chronic kidney disease, stage 3 unspecified: Secondary | ICD-10-CM

## 2012-05-23 DIAGNOSIS — M25552 Pain in left hip: Secondary | ICD-10-CM

## 2012-05-23 DIAGNOSIS — M25559 Pain in unspecified hip: Secondary | ICD-10-CM

## 2012-05-23 DIAGNOSIS — N181 Chronic kidney disease, stage 1: Secondary | ICD-10-CM

## 2012-05-23 DIAGNOSIS — C649 Malignant neoplasm of unspecified kidney, except renal pelvis: Secondary | ICD-10-CM

## 2012-05-23 LAB — BASIC METABOLIC PANEL
BUN: 14 mg/dL (ref 6–23)
CO2: 27 mEq/L (ref 19–32)
Calcium: 9.3 mg/dL (ref 8.4–10.5)
Chloride: 103 mEq/L (ref 96–112)
Creatinine, Ser: 1.6 mg/dL — ABNORMAL HIGH (ref 0.4–1.5)
GFR: 46.45 mL/min — ABNORMAL LOW (ref >60.00)
Glucose, Bld: 111 mg/dL — ABNORMAL HIGH (ref 70–99)
Potassium: 3.8 mEq/L (ref 3.5–5.1)
Sodium: 138 mEq/L (ref 135–145)

## 2012-05-23 LAB — HEPATIC FUNCTION PANEL
ALT: 16 U/L (ref 0–53)
AST: 19 U/L (ref 0–37)
Albumin: 4 g/dL (ref 3.5–5.2)
Alkaline Phosphatase: 148 U/L — ABNORMAL HIGH (ref 39–117)
Bilirubin, Direct: 0.1 mg/dL (ref 0.0–0.3)
Total Bilirubin: 0.8 mg/dL (ref 0.3–1.2)
Total Protein: 7.8 g/dL (ref 6.0–8.3)

## 2012-05-23 LAB — LDL CHOLESTEROL, DIRECT: Direct LDL: 112.7 mg/dL

## 2012-05-23 MED ORDER — LOSARTAN POTASSIUM 100 MG PO TABS: 100.0000 mg | ORAL_TABLET | Freq: Every day | ORAL | Status: DC

## 2012-05-23 MED ORDER — LEVOFLOXACIN 500 MG PO TABS: 500.0000 mg | ORAL_TABLET | Freq: Every day | ORAL | Status: DC

## 2012-05-23 NOTE — Patient Instructions (Addendum)
I am increasing your losartan to 100 mg daily   And will send new rx to pharmacy.  Just double  up on the old ones  Continue the lisinopril and amlodipine.   If your blood pressure is still  High on 3 medications ,  We will need to evaluate the blood flow to your kidneys (Dr. Lucky Cowboy can do this with an ultrasound)  Please come back for a bp check in one or two weeks  --------------------------------------------------------------------------------------------------- You have a viral  Syndrome .  The post nasal drip is causing your cough  Lavage your sinuses twice daily with Simply saline nasal spray.  Use benadryl 25 mg every 8 hours  But avoid any cold remedy that has pseudoephedrine or phenylephrine bc they will raise blood pressure   Use Afrin nasal spray twice daily for 5 days,  Then stop to avoid nasal addiction    Gargle with salt water often for the sore throat.  I am calling in Cheratussin cough syrup (has codeine) for the cough.    if you develop T > 100.4,  Green nasal discharge,  Or facial pain,  Start the levaquin       U

## 2012-05-23 NOTE — Progress Notes (Signed)
Patient ID: [name], male   DOB: January 06, 1946, 66 y.o.   MRN: [id number]   Patient Active Problem List  Diagnosis  . Benign hypertension with chronic kidney disease, stage I  . Hyperlipidemia  . Renal cell carcinoma  . Tobacco abuse counseling  . TIA (transient ischemic attack)  . tobacco abuse  . Left hip pain  . Kidney disease, chronic, stage III (GFR 30-59 ml/min)  . Erectile dysfunction  . PAD (peripheral artery disease)  . Acute URI    Subjective:  CC:   Chief Complaint  Patient presents with  . Follow-up    HPI:   [name] a 66 y.o. male who presents for follow up on htn. He has been up all night preparing a slaughtered hog for barbecue and took his blood pressure medications about 2 and half hours ago. He denies any headaches chest pain or lower extremity cramping. He is tolerating his medications well. 2) He has been having sore throat accompanied by nasal congestion for several days. His cough is nonproductive except for some mild drainage. He denies wheezing fevers and muscle aches.    Past Medical History  Diagnosis Date  . Migraine     cluster  . Obstructive sleep apnea   . Hyperlipidemia   . Hypertension   . TIA (transient ischemic attack) Aug 2013  . tobacco abuse   . Renal cell carcinoma 2004    left kidney heminephrectomy  . 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 PAD (peripheral artery disease) Feb 2009    nonobstructing, renal angiogram [name]    Past Surgical History  Procedure Date  . Heminephrectomy 2004    for renal cell CA  . Renal cryoablation Jan 2006    right kidney,  [name]         The following portions of the patient's history were reviewed and updated as appropriate: Allergies, current medications, and problem list.    Review of Systems:   Patient denies fevers, malaise, unintentional weight loss, skin rash, eye pain, , dysphagia,  hemoptysis ,  wheezing, chest pain,  palpitations, orthopnea, edema, abdominal pain, nausea, melena, diarrhea, constipation, flank pain, dysuria, hematuria, urinary  Frequency, nocturia, numbness, tingling, seizures,  Focal weakness, Loss of consciousness,  Tremor, insomnia, depression, anxiety, and suicidal ideation.        History   Social History  . Marital Status: Married    Spouse Name: N/A    Number of Children: N/A  . Years of Education: N/A   Occupational History  . Not on file.   Social History Main Topics  . Smoking status: Current Every Day Smoker -- 1.5 packs/day    Types: Cigarettes  . Smokeless tobacco: Never Used  . Alcohol Use: Yes  . Drug Use: No  . Sexually Active: Not on file   Other Topics Concern  . Not on file   Social History Narrative  . No narrative on file    Objective:  BP 157/84  Pulse 62  Temp 98.5 F (36.9 C) (Oral)  Resp 12  Ht 5\' 7"  (1.702 m)  Wt 166 lb (75.297 kg)  BMI 26.00 kg/m2  SpO2 98%  General appearance: alert, cooperative and appears stated age Ears: normal TM's and external ear canals both ears Throat: lips, mucosa, and tongue normal; teeth and gums normal Neck: no adenopathy, no carotid bruit, supple, symmetrical, trachea midline and thyroid not enlarged, symmetric, no tenderness/mass/nodules Back: symmetric, no curvature.  ROM normal. No CVA tenderness. Lungs: clear to auscultation bilaterally Heart: regular rate and rhythm, S1, S2 normal, no murmur, click, rub or gallop Abdomen: soft, non-tender; bowel sounds normal; no masses,  no organomegaly Pulses: 2+ and symmetric Skin: Skin color, texture, turgor normal. No rashes or lesions Lymph nodes: Cervical, supraclavicular, and axillary nodes normal.  Assessment and Plan: Benign hypertension with chronic kidney disease, stage I Blood pressure is still not controlled on current dose of losartan, lisinopril, and amlodipine. Renal function is stable and potassium level is normal since starting the ARB. He  does have a history of partial nephrectomy and possibly renal artery stenosis judging by his account of what Dr. Lovette Cliche found on a cardiac catheterization. I am increasing his losartan to 100 mg daily. If his repeat blood pressure in 2 weeks is not at goal we will send him to Dr. dew for renal artery ultrasound.  Kidney disease, chronic, stage III (GFR 30-59 ml/min) Secondary to hypertension and prior partial nephrectomy.  PAD (peripheral artery disease) Nonobstructive by renal angiogram for her 2009 showing left renal artery occluded with multiple accessories supplying remaining kidney with perfusion. Left renal artery was fine. He has carotid artery stenosis 40-45% on the left by CT angiography done September 2013 after admission for TIA with left-sided weakness which resolved.  Acute URI Symptoms of  URI are caused by viral infection currently given current symptoms.   I have explained that in viral URIS, an antibiotic will not help the symptoms and will increase the risk of developing diarrhea. Advised to use oral and nasal decongestants,  Ibuprofen 400 mg and tylenol 650 mq 8 hrs for aches and pains,  tessalon every 8 hours prn cough  Advised to start levaquin only if symptoms worsen to include fevers, facial pain, purulent sputum./drainage.   Left hip pain Improved with steroid injection done by Margaretmary Eddy after vascular evaluation rule out occlusive iliac atherosclerosis   Updated Medication List Outpatient Encounter Prescriptions as of 05/23/2012  Medication Sig Dispense Refill  . amLODipine (NORVASC) 10 MG tablet TAKE 1 TABLET BY MOUTH DAILY  30 tablet  5  . aspirin 325 MG tablet Take 325 mg by mouth daily.      Marland Kitchen atorvastatin (LIPITOR) 20 MG tablet Take 1 tablet (20 mg total) by mouth daily.  90 tablet  0  . CIALIS 20 MG tablet TAKE 1 TABLET BY MOUTH AS NEEDED  30 tablet  0  . lisinopril (PRINIVIL,ZESTRIL) 40 MG tablet Take 1 tablet (40 mg total) by mouth daily.  90 tablet  3  .  levofloxacin (LEVAQUIN) 500 MG tablet Take 1 tablet (500 mg total) by mouth daily.  7 tablet  0  . losartan (COZAAR) 100 MG tablet Take 1 tablet (100 mg total) by mouth daily.  90 tablet  3  . [DISCONTINUED] losartan (COZAAR) 50 MG tablet Take 1 tablet (50 mg total) by mouth daily.  90 tablet  3

## 2012-05-24 ENCOUNTER — Encounter: Payer: Self-pay | Admitting: Internal Medicine

## 2012-05-24 DIAGNOSIS — I739 Peripheral vascular disease, unspecified: Secondary | ICD-10-CM | POA: Insufficient documentation

## 2012-05-24 DIAGNOSIS — C181 Malignant neoplasm of appendix: Secondary | ICD-10-CM | POA: Insufficient documentation

## 2012-05-24 DIAGNOSIS — J069 Acute upper respiratory infection, unspecified: Secondary | ICD-10-CM | POA: Insufficient documentation

## 2012-05-24 NOTE — Assessment & Plan Note (Signed)
Symptoms of  URI are caused by viral infection currently given current symptoms.   I have explained that in viral URIS, an antibiotic will not help the symptoms and will increase the risk of developing diarrhea. Advised to use oral and nasal decongestants,  Ibuprofen 400 mg and tylenol 650 mq 8 hrs for aches and pains,  tessalon every 8 hours prn cough  Advised to start levaquin only if symptoms worsen to include fevers, facial pain, purulent sputum./drainage.

## 2012-05-24 NOTE — Assessment & Plan Note (Signed)
Improved with steroid injection done by Margaretmary Eddy after vascular evaluation rule out occlusive iliac atherosclerosis

## 2012-05-24 NOTE — Assessment & Plan Note (Signed)
Blood pressure is still not controlled on current dose of losartan, lisinopril, and amlodipine. Renal function is stable and potassium level is normal since starting the ARB. He does have a history of partial nephrectomy and possibly renal artery stenosis judging by his account of what Dr. Lovette Cliche found on a cardiac catheterization. I am increasing his losartan to 100 mg daily. If his repeat blood pressure in 2 weeks is not at goal we will send him to Dr. dew for renal artery ultrasound.

## 2012-05-24 NOTE — Assessment & Plan Note (Addendum)
Stable, Secondary to hypertension and prior partial nephrectomy. Recommended referral to local nephrology for followup.

## 2012-05-24 NOTE — Assessment & Plan Note (Signed)
Nonobstructive by renal angiogram for her 2009 showing left renal artery occluded with multiple accessories supplying remaining kidney with perfusion. Left renal artery was fine. He has carotid artery stenosis 40-45% on the left by CT angiography done September 2013 after admission for TIA with left-sided weakness which resolved.

## 2012-05-25 ENCOUNTER — Other Ambulatory Visit: Payer: Self-pay | Admitting: Internal Medicine

## 2012-05-25 ENCOUNTER — Telehealth: Payer: Self-pay | Admitting: Internal Medicine

## 2012-05-25 MED ORDER — GUAIFENESIN-CODEINE 100-10 MG/5ML PO SYRP: 5.0000 mL | ORAL_SOLUTION | Freq: Three times a day (TID) | ORAL | Status: DC | PRN

## 2012-05-25 NOTE — Telephone Encounter (Signed)
Cheratussin Rx sent now. In

## 2012-05-25 NOTE — Telephone Encounter (Signed)
ON AVS from 05/23/12 Dr. Derrel Nip was going to call in Cheratussin for patient, wife states she called [facility] in [place] and prescription hasn't came in yet.  Please advise.

## 2012-05-25 NOTE — Telephone Encounter (Signed)
Routing to Dr. Derrel Nip

## 2012-05-27 NOTE — Addendum Note (Signed)
Addended by: Crecencio Mc on: 05/27/2012 06:43 PM   Modules accepted: Orders

## 2012-05-29 LAB — ALDOSTERONE + RENIN ACTIVITY W/ RATIO
ALDO / PRA Ratio: 0.4 Ratio — ABNORMAL LOW (ref 0.9–28.9)
Aldosterone: <1 ng/dL
PRA LC/MS/MS: 2.78 ng/mL/h (ref 0.25–5.82)

## 2012-05-31 ENCOUNTER — Ambulatory Visit: Payer: Self-pay | Admitting: Internal Medicine

## 2012-05-31 ENCOUNTER — Telehealth: Payer: Self-pay | Admitting: Internal Medicine

## 2012-05-31 DIAGNOSIS — K824 Cholesterolosis of gallbladder: Secondary | ICD-10-CM | POA: Diagnosis not present

## 2012-05-31 DIAGNOSIS — N281 Cyst of kidney, acquired: Secondary | ICD-10-CM | POA: Diagnosis not present

## 2012-05-31 DIAGNOSIS — C649 Malignant neoplasm of unspecified kidney, except renal pelvis: Secondary | ICD-10-CM | POA: Diagnosis not present

## 2012-05-31 DIAGNOSIS — N183 Chronic kidney disease, stage 3 unspecified: Secondary | ICD-10-CM | POA: Diagnosis not present

## 2012-05-31 DIAGNOSIS — R935 Abnormal findings on diagnostic imaging of other abdominal regions, including retroperitoneum: Secondary | ICD-10-CM | POA: Diagnosis not present

## 2012-05-31 NOTE — Telephone Encounter (Signed)
Abdominal ultrasoudn showed no liver or pancreastic masses, a normal gallbladder, and multiple cystic appearing areas on both kidneys .  He has a nephrology feral in process,  Please send copy with notes to them.

## 2012-05-31 NOTE — Telephone Encounter (Signed)
What Nephrology office is pt scheduled with? Need to send these results. Thanks

## 2012-06-07 ENCOUNTER — Encounter: Payer: Self-pay | Admitting: Internal Medicine

## 2012-06-07 ENCOUNTER — Ambulatory Visit (INDEPENDENT_AMBULATORY_CARE_PROVIDER_SITE_OTHER): Payer: Medicare Other | Admitting: Internal Medicine

## 2012-06-07 VITALS — BP 148/80 | HR 65 | Temp 98.2°F | Resp 16 | Wt 166.0 lb

## 2012-06-07 DIAGNOSIS — I129 Hypertensive chronic kidney disease with stage 1 through stage 4 chronic kidney disease, or unspecified chronic kidney disease: Secondary | ICD-10-CM | POA: Diagnosis not present

## 2012-06-07 DIAGNOSIS — Z79899 Other long term (current) drug therapy: Secondary | ICD-10-CM | POA: Diagnosis not present

## 2012-06-07 DIAGNOSIS — E785 Hyperlipidemia, unspecified: Secondary | ICD-10-CM

## 2012-06-07 DIAGNOSIS — M25559 Pain in unspecified hip: Secondary | ICD-10-CM

## 2012-06-07 DIAGNOSIS — N181 Chronic kidney disease, stage 1: Secondary | ICD-10-CM

## 2012-06-07 DIAGNOSIS — M25552 Pain in left hip: Secondary | ICD-10-CM

## 2012-06-07 DIAGNOSIS — I739 Peripheral vascular disease, unspecified: Secondary | ICD-10-CM

## 2012-06-07 NOTE — Progress Notes (Signed)
Patient ID: [name], male   DOB: 1945/10/21, 67 y.o.   MRN: [id number]  Patient Active Problem List  Diagnosis  . Benign hypertension with chronic kidney disease, stage I  . Hyperlipidemia  . Renal cell carcinoma  . Tobacco abuse counseling  . TIA (transient ischemic attack)  . tobacco abuse  . Left hip pain  . Kidney disease, chronic, stage III (GFR 30-59 ml/min)  . Erectile dysfunction  . PAD (peripheral artery disease)  . Acute URI    Subjective:  CC:   Chief Complaint  Patient presents with  . Follow-up    BP check    HPI:   [name] a 67 y.o. male who presents for follow up on hypertension,  CKD,  PAD.   His blood pressure has improved and he has no symptoms of headache, chest pain or shortness of breath.   He has recurrent episodes of dizziness lasting 2 -3 second episodes brought on by tilting head back .  Had CTA recently 40-50% carotid stenosis noted.      Past Medical History  Diagnosis Date  . Migraine     cluster  . Obstructive sleep apnea   . Hyperlipidemia   . Hypertension   . TIA (transient ischemic attack) Aug 2013  . tobacco abuse   . Renal cell carcinoma 2004    left kidney heminephrectomy  . 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 PAD (peripheral artery disease) Feb 2009    nonobstructing, renal angiogram [name])    Past Surgical History  Procedure Date  . Heminephrectomy 2004    for renal cell CA  . Renal cryoablation Jan 2006    right kidney,  [name]    The following portions of the patient's history were reviewed and updated as appropriate: Allergies, current medications, and problem list.    Review of Systems:   Patient denies headache, fevers, malaise, unintentional weight loss, skin rash, eye pain, sinus congestion and sinus pain, sore throat, dysphagia,  hemoptysis , cough, dyspnea, wheezing, chest pain, palpitations, orthopnea, edema, abdominal pain, nausea, melena, diarrhea,  constipation, flank pain, dysuria, hematuria, urinary  Frequency, nocturia, numbness, tingling, seizures,  Focal weakness, Loss of consciousness,  Tremor, insomnia, depression, anxiety, and suicidal ideation.     History   Social History  . Marital Status: Married    Spouse Name: N/A    Number of Children: N/A  . Years of Education: N/A   Occupational History  . Not on file.   Social History Main Topics  . Smoking status: Current Every Day Smoker -- 1.5 packs/day    Types: Cigarettes  . Smokeless tobacco: Never Used  . Alcohol Use: Yes  . Drug Use: No  . Sexually Active: Not on file   Other Topics Concern  . Not on file   Social History Narrative  . No narrative on file    Objective:  BP 148/80  Pulse 65  Temp 98.2 F (36.8 C) (Oral)  Resp 16  Wt 166 lb (75.297 kg)  SpO2 98%  General appearance: alert, cooperative and appears stated age Ears: normal TM's and external ear canals both ears Throat: lips, mucosa, and tongue normal; teeth and gums normal Neck: no adenopathy, no carotid bruit, supple, symmetrical, trachea midline and thyroid not enlarged, symmetric, no tenderness/mass/nodules Back: symmetric, no curvature. ROM normal. No CVA tenderness. Lungs: clear to auscultation bilaterally Heart: regular rate and rhythm, S1, S2 normal, no murmur, click, rub  or gallop Abdomen: soft, non-tender; bowel sounds normal; no masses,  no organomegaly Pulses: 2+ and symmetric Skin: Skin color, texture, turgor normal. No rashes or lesions Lymph nodes: Cervical, supraclavicular, and axillary nodes normal.  Assessment and Plan:  Benign hypertension with chronic kidney disease, stage I Improved, bt not at goal . howevere his meds were changed last week.  No changes today as he has nephrologist referral next week.   Left hip pain Secondary to degenerative changes and osteoarthritis.  He has been instructed to avoid NSAIDs due t CKD> s.  PAD (peripheral artery disease) With  noncritical carotid stenosis and renal artery stenosis   Updated Medication List Outpatient Encounter Prescriptions as of 06/07/2012  Medication Sig Dispense Refill  . amLODipine (NORVASC) 10 MG tablet TAKE 1 TABLET BY MOUTH DAILY  30 tablet  5  . aspirin 325 MG tablet Take 325 mg by mouth daily.      Marland Kitchen atorvastatin (LIPITOR) 20 MG tablet Take 1 tablet (20 mg total) by mouth daily.  90 tablet  0  . CIALIS 20 MG tablet TAKE 1 TABLET BY MOUTH AS NEEDED  30 tablet  0  . levofloxacin (LEVAQUIN) 500 MG tablet Take 1 tablet (500 mg total) by mouth daily.  7 tablet  0  . lisinopril (PRINIVIL,ZESTRIL) 40 MG tablet Take 1 tablet (40 mg total) by mouth daily.  90 tablet  3  . losartan (COZAAR) 100 MG tablet Take 1 tablet (100 mg total) by mouth daily.  90 tablet  3  . [DISCONTINUED] guaiFENesin-codeine (ROBITUSSIN AC) 100-10 MG/5ML syrup Take 5 mLs by mouth 3 (three) times daily as needed for cough.  120 mL  0     Orders Placed This Encounter  Procedures  . Comprehensive metabolic panel  . Lipid panel    No Follow-up on file.

## 2012-06-07 NOTE — Patient Instructions (Addendum)
Your bp is improved but not quite at goal yet.  i will postpone adding any more medication until the nephrologist sees you.   Return for fasting labs some day soon so we recheck your cholesterol  Goal is LDL less than 70

## 2012-06-09 ENCOUNTER — Encounter: Payer: Self-pay | Admitting: Internal Medicine

## 2012-06-09 NOTE — Assessment & Plan Note (Signed)
Secondary to degenerative changes and osteoarthritis.  He has been instructed to avoid NSAIDs due t CKD> s.

## 2012-06-09 NOTE — Assessment & Plan Note (Addendum)
Improved, bt not at goal . howevere his meds were changed last week.  No changes today as he has nephrologist referral next week.

## 2012-06-09 NOTE — Assessment & Plan Note (Signed)
With noncritical carotid stenosis and renal artery stenosis

## 2012-06-12 DIAGNOSIS — D631 Anemia in chronic kidney disease: Secondary | ICD-10-CM | POA: Diagnosis not present

## 2012-06-12 DIAGNOSIS — N039 Chronic nephritic syndrome with unspecified morphologic changes: Secondary | ICD-10-CM | POA: Diagnosis not present

## 2012-06-12 DIAGNOSIS — R319 Hematuria, unspecified: Secondary | ICD-10-CM | POA: Diagnosis not present

## 2012-06-12 DIAGNOSIS — I10 Essential (primary) hypertension: Secondary | ICD-10-CM | POA: Diagnosis not present

## 2012-06-12 DIAGNOSIS — R3129 Other microscopic hematuria: Secondary | ICD-10-CM | POA: Diagnosis not present

## 2012-06-12 DIAGNOSIS — N183 Chronic kidney disease, stage 3 unspecified: Secondary | ICD-10-CM | POA: Diagnosis not present

## 2012-06-12 DIAGNOSIS — N2581 Secondary hyperparathyroidism of renal origin: Secondary | ICD-10-CM | POA: Diagnosis not present

## 2012-06-16 ENCOUNTER — Other Ambulatory Visit (INDEPENDENT_AMBULATORY_CARE_PROVIDER_SITE_OTHER): Payer: Medicare Other

## 2012-06-16 DIAGNOSIS — Z79899 Other long term (current) drug therapy: Secondary | ICD-10-CM | POA: Diagnosis not present

## 2012-06-16 DIAGNOSIS — E785 Hyperlipidemia, unspecified: Secondary | ICD-10-CM

## 2012-06-16 DIAGNOSIS — R739 Hyperglycemia, unspecified: Secondary | ICD-10-CM

## 2012-06-16 LAB — COMPREHENSIVE METABOLIC PANEL
ALT: 16 U/L (ref 0–53)
AST: 15 U/L (ref 0–37)
Albumin: 3.9 g/dL (ref 3.5–5.2)
Alkaline Phosphatase: 146 U/L — ABNORMAL HIGH (ref 39–117)
BUN: 22 mg/dL (ref 6–23)
CO2: 26 mEq/L (ref 19–32)
Calcium: 9.5 mg/dL (ref 8.4–10.5)
Chloride: 107 mEq/L (ref 96–112)
Creatinine, Ser: 1.6 mg/dL — ABNORMAL HIGH (ref 0.4–1.5)
GFR: 45.78 mL/min — ABNORMAL LOW (ref >60.00)
Glucose, Bld: 128 mg/dL — ABNORMAL HIGH (ref 70–99)
Potassium: 4.6 mEq/L (ref 3.5–5.1)
Sodium: 140 mEq/L (ref 135–145)
Total Bilirubin: 0.6 mg/dL (ref 0.3–1.2)
Total Protein: 7.5 g/dL (ref 6.0–8.3)

## 2012-06-16 LAB — LIPID PANEL
Cholesterol: 160 mg/dL (ref 0–200)
HDL: 27 mg/dL — ABNORMAL LOW (ref >39.00)
LDL Cholesterol: 106 mg/dL — ABNORMAL HIGH (ref 0–99)
Total CHOL/HDL Ratio: 6
Triglycerides: 136 mg/dL (ref 0.0–149.0)
VLDL: 27.2 mg/dL (ref 0.0–40.0)

## 2012-06-18 NOTE — Addendum Note (Signed)
Addended by: Crecencio Mc on: 06/18/2012 06:38 PM   Modules accepted: Orders

## 2012-06-20 ENCOUNTER — Telehealth: Payer: Self-pay | Admitting: *Deleted

## 2012-06-20 ENCOUNTER — Other Ambulatory Visit (INDEPENDENT_AMBULATORY_CARE_PROVIDER_SITE_OTHER): Payer: Medicare Other

## 2012-06-20 DIAGNOSIS — R7309 Other abnormal glucose: Secondary | ICD-10-CM

## 2012-06-20 LAB — HEMOGLOBIN A1C: Hgb A1c MFr Bld: 6.4 % (ref 4.6–6.5)

## 2012-06-20 NOTE — Telephone Encounter (Signed)
Pt is coming in for labs, are they here just for an a1c? Dx code? Thank you

## 2012-06-20 NOTE — Telephone Encounter (Signed)
Yes, just an A1c,  Hyperglycemia. Is the reason

## 2012-06-20 NOTE — Telephone Encounter (Signed)
Just an A1C  bc you told me it couldn't be added to last weeks labs.  Reason  hyperglycemia

## 2012-06-22 DIAGNOSIS — C44621 Squamous cell carcinoma of skin of unspecified upper limb, including shoulder: Secondary | ICD-10-CM | POA: Diagnosis not present

## 2012-06-22 DIAGNOSIS — L578 Other skin changes due to chronic exposure to nonionizing radiation: Secondary | ICD-10-CM | POA: Diagnosis not present

## 2012-06-22 DIAGNOSIS — D485 Neoplasm of uncertain behavior of skin: Secondary | ICD-10-CM | POA: Diagnosis not present

## 2012-07-04 ENCOUNTER — Ambulatory Visit: Payer: Self-pay | Admitting: Hematology and Oncology

## 2012-07-04 DIAGNOSIS — E785 Hyperlipidemia, unspecified: Secondary | ICD-10-CM | POA: Diagnosis not present

## 2012-07-04 DIAGNOSIS — N183 Chronic kidney disease, stage 3 unspecified: Secondary | ICD-10-CM | POA: Diagnosis not present

## 2012-07-04 DIAGNOSIS — I129 Hypertensive chronic kidney disease with stage 1 through stage 4 chronic kidney disease, or unspecified chronic kidney disease: Secondary | ICD-10-CM | POA: Diagnosis not present

## 2012-07-04 DIAGNOSIS — R799 Abnormal finding of blood chemistry, unspecified: Secondary | ICD-10-CM | POA: Diagnosis not present

## 2012-07-04 DIAGNOSIS — Z7982 Long term (current) use of aspirin: Secondary | ICD-10-CM | POA: Diagnosis not present

## 2012-07-04 DIAGNOSIS — Z8553 Personal history of malignant neoplasm of renal pelvis: Secondary | ICD-10-CM | POA: Diagnosis not present

## 2012-07-04 DIAGNOSIS — D472 Monoclonal gammopathy: Secondary | ICD-10-CM | POA: Diagnosis not present

## 2012-07-04 DIAGNOSIS — F17200 Nicotine dependence, unspecified, uncomplicated: Secondary | ICD-10-CM | POA: Diagnosis not present

## 2012-07-04 DIAGNOSIS — Z8673 Personal history of transient ischemic attack (TIA), and cerebral infarction without residual deficits: Secondary | ICD-10-CM | POA: Diagnosis not present

## 2012-07-04 DIAGNOSIS — C44621 Squamous cell carcinoma of skin of unspecified upper limb, including shoulder: Secondary | ICD-10-CM | POA: Diagnosis not present

## 2012-07-04 DIAGNOSIS — Z79899 Other long term (current) drug therapy: Secondary | ICD-10-CM | POA: Diagnosis not present

## 2012-07-04 DIAGNOSIS — Z905 Acquired absence of kidney: Secondary | ICD-10-CM | POA: Diagnosis not present

## 2012-07-04 LAB — CBC CANCER CENTER
Basophil #: 0.1 x10 3/mm (ref 0.0–0.1)
Basophil %: 0.9 %
Eosinophil #: 0.7 x10 3/mm (ref 0.0–0.7)
Eosinophil %: 6.3 %
HCT: 42.9 % (ref 40.0–52.0)
HGB: 14.9 g/dL (ref 13.0–18.0)
Lymphocyte #: 2.1 x10 3/mm (ref 1.0–3.6)
Lymphocyte %: 19.6 %
MCH: 29.4 pg (ref 26.0–34.0)
MCHC: 34.7 g/dL (ref 32.0–36.0)
MCV: 85 fL (ref 80–100)
Monocyte #: 0.6 x10 3/mm (ref 0.2–1.0)
Monocyte %: 5.2 %
Neutrophil #: 7.4 x10 3/mm — ABNORMAL HIGH (ref 1.4–6.5)
Neutrophil %: 68 %
Platelet: 228 x10 3/mm (ref 150–440)
RBC: 5.07 10*6/uL (ref 4.40–5.90)
RDW: 13.2 % (ref 11.5–14.5)
WBC: 10.8 x10 3/mm — ABNORMAL HIGH (ref 3.8–10.6)

## 2012-07-04 LAB — BASIC METABOLIC PANEL
Anion Gap: 8 (ref 7–16)
BUN: 15 mg/dL (ref 7–18)
Calcium, Total: 9.1 mg/dL (ref 8.5–10.1)
Chloride: 102 mmol/L (ref 98–107)
Co2: 29 mmol/L (ref 21–32)
Creatinine: 1.73 mg/dL — ABNORMAL HIGH (ref 0.60–1.30)
EGFR (African American): 47 — ABNORMAL LOW
EGFR (Non-African Amer.): 40 — ABNORMAL LOW
Glucose: 88 mg/dL (ref 65–99)
Osmolality: 278 (ref 275–301)
Potassium: 4.3 mmol/L (ref 3.5–5.1)
Sodium: 139 mmol/L (ref 136–145)

## 2012-07-07 ENCOUNTER — Ambulatory Visit: Payer: Medicare Other | Admitting: Internal Medicine

## 2012-07-08 ENCOUNTER — Other Ambulatory Visit: Payer: Self-pay | Admitting: Internal Medicine

## 2012-07-12 LAB — KAPPA/LAMBDA FREE LIGHT CHAINS (ARMC)

## 2012-07-12 LAB — PROT IMMUNOELECTROPHORES(ARMC)

## 2012-07-13 DIAGNOSIS — N2581 Secondary hyperparathyroidism of renal origin: Secondary | ICD-10-CM | POA: Diagnosis not present

## 2012-07-13 DIAGNOSIS — R809 Proteinuria, unspecified: Secondary | ICD-10-CM | POA: Diagnosis not present

## 2012-07-13 DIAGNOSIS — N183 Chronic kidney disease, stage 3 unspecified: Secondary | ICD-10-CM | POA: Diagnosis not present

## 2012-07-13 DIAGNOSIS — I10 Essential (primary) hypertension: Secondary | ICD-10-CM | POA: Diagnosis not present

## 2012-07-14 ENCOUNTER — Ambulatory Visit (INDEPENDENT_AMBULATORY_CARE_PROVIDER_SITE_OTHER): Payer: Medicare Other | Admitting: Internal Medicine

## 2012-07-14 ENCOUNTER — Encounter: Payer: Self-pay | Admitting: Internal Medicine

## 2012-07-14 VITALS — BP 150/68 | HR 70 | Temp 98.1°F | Resp 16 | Wt 167.0 lb

## 2012-07-14 DIAGNOSIS — E118 Type 2 diabetes mellitus with unspecified complications: Secondary | ICD-10-CM

## 2012-07-14 NOTE — Progress Notes (Signed)
Patient ID: [name], male   DOB: [birth year], 67 y.o.   MRN: [id number]  Patient Active Problem List  Diagnosis  . Hyperlipidemia  . Renal cell carcinoma  . Tobacco abuse counseling  . TIA (transient ischemic attack)  . tobacco abuse  . Left hip pain  . Kidney disease, chronic, stage III (GFR 30-59 ml/min)  . Erectile dysfunction  . PAD (peripheral artery disease)  . Controlled diabetes mellitus type 2 with complications    Subjective:  CC:   Chief Complaint  Patient presents with  . Follow-up    Discuss Diet    HPI:   [name] a 67 y.o. male who presents to dicuss new onset diabetes, diagnosed with fasting glucose of 128 and a hgba1c of 6.4  Father and aunt had type 2 dm.  Reviewed his diet.  He frequently skips breakfast ,  and has a caffeine free pepsi and a candy bar  Mid morning.  He usually has a cup of applesauce for dinner (lunch) .  Dinner with his wife is usually a balanced dinner.   Past Medical History  Diagnosis Date  . Migraine     cluster  . Obstructive sleep apnea   . Hyperlipidemia   . Hypertension   . TIA (transient ischemic attack) Aug 2013  . tobacco abuse   . Renal cell carcinoma 2004    left kidney heminephrectomy  . 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 PAD (peripheral artery disease) Feb 2009    nonobstructing, renal angiogram [name]    Past Surgical History  Procedure Laterality Date  . Heminephrectomy  2004    for renal cell CA  . Renal cryoablation  Jan 2006    right kidney,  [name]       The following portions of the patient's history were reviewed and updated as appropriate: Allergies, current medications, and problem list.    Review of Systems:   Patient denies headache, fevers, malaise, unintentional weight loss, skin rash, eye pain, sinus congestion and sinus pain, sore throat, dysphagia,  hemoptysis , cough, dyspnea, wheezing, chest pain, palpitations, orthopnea, edema,  abdominal pain, nausea, melena, diarrhea, constipation, flank pain, dysuria, hematuria, urinary  Frequency, nocturia, numbness, tingling, seizures,  Focal weakness, Loss of consciousness,  Tremor, insomnia, depression, anxiety, and suicidal ideation.     History   Social History  . Marital Status: Married    Spouse Name: N/A    Number of Children: N/A  . Years of Education: N/A   Occupational History  . Not on file.   Social History Main Topics  . Smoking status: Current Every Day Smoker -- 1.50 packs/day    Types: Cigarettes  . Smokeless tobacco: Never Used  . Alcohol Use: Yes  . Drug Use: No  . Sexually Active: Not on file   Other Topics Concern  . Not on file   Social History Narrative  . No narrative on file    Objective:  BP 150/68  Pulse 70  Temp(Src) 98.1 F (36.7 C) (Oral)  Resp 16  Wt 167 lb (75.751 kg)  BMI 26.15 kg/m2  SpO2 97%  General appearance: alert, cooperative and appears stated age Back: symmetric, no curvature. ROM normal. No CVA tenderness. Lungs: clear to auscultation bilaterally Heart: regular rate and rhythm, S1, S2 normal, no murmur, click, rub or gallop Abdomen: soft, non-tender; bowel sounds normal; no masses,  no organomegaly Pulses: 2+ and symmetric Skin: Skin color, texture,  turgor normal. No rashes or lesions Lymph nodes: Cervical, supraclavicular, and axillary nodes normal.  Assessment and Plan:  Controlled diabetes mellitus type 2 with complications New diagnosis , with renal disease and peripheral vascular disease,  spent 30 minutes discussing low glycemic index diet and effects of certain foods on blood sugars.  Advised to have diabetic eye exam done.    Updated Medication List Outpatient Encounter Prescriptions as of 07/14/2012  Medication Sig Dispense Refill  . amLODipine (NORVASC) 10 MG tablet TAKE 1 TABLET BY MOUTH DAILY  30 tablet  5  . aspirin 325 MG tablet Take 325 mg by mouth daily.      Marland Kitchen atorvastatin (LIPITOR) 20  MG tablet Take 1 tablet (20 mg total) by mouth daily.  90 tablet  0  . CIALIS 20 MG tablet TAKE 1 TABLET BY MOUTH AS NEEDED  30 tablet  0  . losartan (COZAAR) 100 MG tablet Take 1 tablet (100 mg total) by mouth daily.  90 tablet  3  . hydrALAZINE (APRESOLINE) 25 MG tablet Take 1 tablet by mouth 3 (three) times daily.      Marland Kitchen lisinopril (PRINIVIL,ZESTRIL) 40 MG tablet Take 1 tablet (40 mg total) by mouth daily.  90 tablet  3  . [DISCONTINUED] levofloxacin (LEVAQUIN) 500 MG tablet Take 1 tablet (500 mg total) by mouth daily.  7 tablet  0   No facility-administered encounter medications on file as of 07/14/2012.     No orders of the defined types were placed in this encounter.    No Follow-up on file.

## 2012-07-14 NOTE — Patient Instructions (Addendum)
You have new onset type 2 diabetes mellitus.    7 AM Breakfast:  Low carbohydrate Protein  Shakes (I recommend the EAS AdvantEdge "Carb Control" shakes  Or the low carb shakes by Atkins.   Both are available everywhere:  In  cases at [employer]  Or in 4 packs at grocery stores and pharmacies  2.5 carbs  (Alternative is  a toasted Arnold's Sandwhich Thin w/ peanut butter, a "Bagel Thin" with cream cheese and salmon) or  a scrambled egg burrito made with a low carb tortilla .  Avoid cereal and bananas, oatmeal too unless you are cooking the old fashioned kind that takes 30-40 minutes to prepare.  the rest is overly processed, has minimal fiber, and is loaded with carbohydrates!   10 AM: Protein bar by Atkins (the snack size, under 200 cal).  There are many varieties , available widely again or in bulk in limited varieties at [employer]Js)  Other so called "protein bars" tend to be loaded with carbohydrates.  Remember, in food advertising, the word "energy" is synonymous for " carbohydrate."  Lunch: sandwich of [place], (or any lunchmeat, grilled meat or canned tuna), fresh avocado, mayonnaise  and cheese on a lower carbohydrate pita bread, flatbread, or tortilla . Ok to use regular mayonnaise. The bread is the only source or carbohydrate that can be decreased (Joseph's makes a pita bread and a flat bread that are 50 cal and 4 net carbs ; Toufayan makes a low carb flatbread that's 100 cal and 9 net carbs  and  [place] makes a low carb whole wheat tortilla  That is 210 cal and 6 net carbs)  3 PM:  Mid day :  Another protein bar,  Or a  cheese stick (100 cal, 0 carbs),  Or 1 ounce of  almonds, walnuts, pistachios, pecans, peanuts,  Macadamia nuts. Or a Dannon light n Fit greek yogurt, 80 cal 8 net carbs . Avoid "granola"; the dried cranberries and raisins are loaded with carbohydrates. Mixed nuts ok if no raisins or cranberries or dried fruit.      6 PM  Dinner:  "mean and green:"  Meat/chicken/fish or a high protein  legume; , with a green salad, and a low GI  Veggie (broccoli, cauliflower, green beans, spinach, brussel sprouts. Lima beans) : Avoid "Low fat dressings, as well as [place]! They are loaded with sugar! Instead use ranch, vinagrette,  Blue cheese, etc.  There is a low carb pasta by Dreamfield's available at [employer] that is acceptable and tastes great. Try Michel Angel's chicken piccata over low carb pasta. The chicken dish is 0 carbs, and can be found in frozen section at [place]. Also try [employer]" (pulled pork, no sauce,  0 carbs) and his pot roast.   both are in the refrigerated section at [employer]Js   Dreamfield's makes a low carb pasta only 5 g/serving.  Available at all grocery stores,  And tastes like normal pasta  9 PM snack : Breyer's "low carb" fudgsicle or  ice cream bar (Carb Smart line), or  Weight Watcher's ice cream bar , or another "no sugar added" ice cream;a serving of fresh berries/cherries with whipped cream (Avoid bananas, pineapple, grapes  and watermelon on a regular basis because they are high in sugar)   Remember that snack Substitutions should be less than 10 carbs per serving and meals < 20 carbs. Remember to subtract fiber grams and sugar alcohols to get the "net  carbs."

## 2012-07-16 ENCOUNTER — Encounter: Payer: Self-pay | Admitting: Internal Medicine

## 2012-07-16 DIAGNOSIS — E118 Type 2 diabetes mellitus with unspecified complications: Secondary | ICD-10-CM | POA: Insufficient documentation

## 2012-07-16 NOTE — Assessment & Plan Note (Addendum)
New diagnosis , with renal disease and peripheral vascular disease,  spent 30 minutes discussing low glycemic index diet and effects of certain foods on blood sugars.  Advised to have diabetic eye exam done.

## 2012-07-18 DIAGNOSIS — E785 Hyperlipidemia, unspecified: Secondary | ICD-10-CM | POA: Diagnosis not present

## 2012-07-18 DIAGNOSIS — D472 Monoclonal gammopathy: Secondary | ICD-10-CM | POA: Diagnosis not present

## 2012-07-18 DIAGNOSIS — I129 Hypertensive chronic kidney disease with stage 1 through stage 4 chronic kidney disease, or unspecified chronic kidney disease: Secondary | ICD-10-CM | POA: Diagnosis not present

## 2012-07-18 DIAGNOSIS — R799 Abnormal finding of blood chemistry, unspecified: Secondary | ICD-10-CM | POA: Diagnosis not present

## 2012-07-18 DIAGNOSIS — N183 Chronic kidney disease, stage 3 unspecified: Secondary | ICD-10-CM | POA: Diagnosis not present

## 2012-07-18 DIAGNOSIS — C44621 Squamous cell carcinoma of skin of unspecified upper limb, including shoulder: Secondary | ICD-10-CM | POA: Diagnosis not present

## 2012-07-19 DIAGNOSIS — C44621 Squamous cell carcinoma of skin of unspecified upper limb, including shoulder: Secondary | ICD-10-CM | POA: Diagnosis not present

## 2012-07-22 ENCOUNTER — Ambulatory Visit: Payer: Self-pay | Admitting: Hematology and Oncology

## 2012-08-04 DIAGNOSIS — Q809 Congenital ichthyosis, unspecified: Secondary | ICD-10-CM | POA: Diagnosis not present

## 2012-08-04 DIAGNOSIS — L738 Other specified follicular disorders: Secondary | ICD-10-CM | POA: Diagnosis not present

## 2012-08-04 DIAGNOSIS — D239 Other benign neoplasm of skin, unspecified: Secondary | ICD-10-CM | POA: Diagnosis not present

## 2012-08-04 DIAGNOSIS — Z85828 Personal history of other malignant neoplasm of skin: Secondary | ICD-10-CM | POA: Diagnosis not present

## 2012-08-07 DIAGNOSIS — IMO0001 Reserved for inherently not codable concepts without codable children: Secondary | ICD-10-CM | POA: Diagnosis not present

## 2012-08-07 DIAGNOSIS — M5137 Other intervertebral disc degeneration, lumbosacral region: Secondary | ICD-10-CM | POA: Diagnosis not present

## 2012-08-07 DIAGNOSIS — M999 Biomechanical lesion, unspecified: Secondary | ICD-10-CM | POA: Diagnosis not present

## 2012-08-09 DIAGNOSIS — M5137 Other intervertebral disc degeneration, lumbosacral region: Secondary | ICD-10-CM | POA: Diagnosis not present

## 2012-08-09 DIAGNOSIS — IMO0001 Reserved for inherently not codable concepts without codable children: Secondary | ICD-10-CM | POA: Diagnosis not present

## 2012-08-09 DIAGNOSIS — M999 Biomechanical lesion, unspecified: Secondary | ICD-10-CM | POA: Diagnosis not present

## 2012-08-10 ENCOUNTER — Encounter: Payer: Self-pay | Admitting: Internal Medicine

## 2012-08-11 DIAGNOSIS — M5137 Other intervertebral disc degeneration, lumbosacral region: Secondary | ICD-10-CM | POA: Diagnosis not present

## 2012-08-11 DIAGNOSIS — M999 Biomechanical lesion, unspecified: Secondary | ICD-10-CM | POA: Diagnosis not present

## 2012-08-11 DIAGNOSIS — IMO0001 Reserved for inherently not codable concepts without codable children: Secondary | ICD-10-CM | POA: Diagnosis not present

## 2012-09-05 DIAGNOSIS — F17200 Nicotine dependence, unspecified, uncomplicated: Secondary | ICD-10-CM | POA: Diagnosis not present

## 2012-09-05 DIAGNOSIS — I6529 Occlusion and stenosis of unspecified carotid artery: Secondary | ICD-10-CM | POA: Diagnosis not present

## 2012-09-05 DIAGNOSIS — I739 Peripheral vascular disease, unspecified: Secondary | ICD-10-CM | POA: Diagnosis not present

## 2012-09-05 DIAGNOSIS — M79609 Pain in unspecified limb: Secondary | ICD-10-CM | POA: Diagnosis not present

## 2012-10-09 DIAGNOSIS — N289 Disorder of kidney and ureter, unspecified: Secondary | ICD-10-CM | POA: Diagnosis not present

## 2012-10-09 DIAGNOSIS — R319 Hematuria, unspecified: Secondary | ICD-10-CM | POA: Diagnosis not present

## 2012-10-09 DIAGNOSIS — N2581 Secondary hyperparathyroidism of renal origin: Secondary | ICD-10-CM | POA: Diagnosis not present

## 2012-10-09 DIAGNOSIS — R809 Proteinuria, unspecified: Secondary | ICD-10-CM | POA: Diagnosis not present

## 2012-10-09 DIAGNOSIS — I10 Essential (primary) hypertension: Secondary | ICD-10-CM | POA: Diagnosis not present

## 2012-10-09 DIAGNOSIS — N183 Chronic kidney disease, stage 3 unspecified: Secondary | ICD-10-CM | POA: Diagnosis not present

## 2012-10-09 DIAGNOSIS — D631 Anemia in chronic kidney disease: Secondary | ICD-10-CM | POA: Diagnosis not present

## 2012-10-18 ENCOUNTER — Ambulatory Visit (INDEPENDENT_AMBULATORY_CARE_PROVIDER_SITE_OTHER): Payer: Medicare Other | Admitting: Internal Medicine

## 2012-10-18 ENCOUNTER — Encounter: Payer: Self-pay | Admitting: Internal Medicine

## 2012-10-18 VITALS — BP 160/78 | HR 74 | Temp 98.2°F | Resp 16 | Wt 162.5 lb

## 2012-10-18 DIAGNOSIS — E1059 Type 1 diabetes mellitus with other circulatory complications: Secondary | ICD-10-CM

## 2012-10-18 DIAGNOSIS — I129 Hypertensive chronic kidney disease with stage 1 through stage 4 chronic kidney disease, or unspecified chronic kidney disease: Secondary | ICD-10-CM | POA: Insufficient documentation

## 2012-10-18 DIAGNOSIS — I10 Essential (primary) hypertension: Secondary | ICD-10-CM

## 2012-10-18 DIAGNOSIS — E118 Type 2 diabetes mellitus with unspecified complications: Secondary | ICD-10-CM | POA: Diagnosis not present

## 2012-10-18 LAB — HEMOGLOBIN A1C: Hgb A1c MFr Bld: 6 % (ref 4.6–6.5)

## 2012-10-18 LAB — HM DIABETES FOOT EXAM: HM Diabetic Foot Exam: NORMAL

## 2012-10-18 MED ORDER — HYDRALAZINE HCL 50 MG PO TABS: 50.0000 mg | ORAL_TABLET | Freq: Three times a day (TID) | ORAL | Status: DC

## 2012-10-18 NOTE — Assessment & Plan Note (Signed)
Uncontrolled.  Increasing hydralazine to 50 mg tid today.

## 2012-10-18 NOTE — Patient Instructions (Addendum)
You bllod pressure is still too high ,  Our goal is < 130/70  Increase your hydralazine  To 50 mg three times daily ,  I will print out  A new rx for the 50 mg dose   Don't forget to eat often low carb snacks  To stabilize blood sugars (use the atkins bars and shakes)

## 2012-10-18 NOTE — Progress Notes (Signed)
Patient ID: [name], male   DOB: 03/16/1946, 67 y.o.   MRN: [id number]   Patient Active Problem List   Diagnosis Date Noted  . Unspecified essential hypertension 10/18/2012  . Controlled diabetes mellitus type 2 with complications XX123456  . PAD (peripheral artery disease)   . Left hip pain 04/20/2012  . Kidney disease, chronic, stage III (GFR 30-59 ml/min) 04/20/2012  . Erectile dysfunction 04/20/2012  . tobacco abuse   . TIA (transient ischemic attack) 01/15/2012  . Tobacco abuse counseling 05/06/2011  . Hyperlipidemia   . Renal cell carcinoma     Subjective:  CC:   Chief Complaint  Patient presents with  . Follow-up    HPI:   [name] a 67 y.o. male who presents for follow up on Diabetes, hypertension, and  CKD follow up.  His GFR has been stable by recent Nephrology follow up.  Several cysts were noted on his remaining kidney and the diagnosis of PCKD is being ruled out by nephrology.   His home bps hve been averaging  150/50. He has no new issues today. Has lost 5 lbs following a lower glycemic index diet.    Past Medical History  Diagnosis Date  . Migraine     cluster  . Obstructive sleep apnea   . Hyperlipidemia   . Hypertension   . TIA (transient ischemic attack) Aug 2013  . tobacco abuse   . Renal cell carcinoma 2004    left kidney heminephrectomy  . 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 PAD (peripheral artery disease) Feb 2009    nonobstructing, renal angiogram [name]    Past Surgical History  Procedure Laterality Date  . Heminephrectomy  2004    for renal cell CA  . Renal cryoablation  Jan 2006    right kidney,  [name]       The following portions of the patient's history were reviewed and updated as appropriate: Allergies, current medications, and problem list.    Review of Systems:   Patient denies headache, fevers, malaise, unintentional weight loss, skin rash, eye pain, sinus  congestion and sinus pain, sore throat, dysphagia,  hemoptysis , cough, dyspnea, wheezing, chest pain, palpitations, orthopnea, edema, abdominal pain, nausea, melena, diarrhea, constipation, flank pain, dysuria, hematuria, urinary  Frequency, nocturia, numbness, tingling, seizures,  Focal weakness, Loss of consciousness,  Tremor, insomnia, depression, anxiety, and suicidal ideation.     History   Social History  . Marital Status: Married    Spouse Name: N/A    Number of Children: N/A  . Years of Education: N/A   Occupational History  . Not on file.   Social History Main Topics  . Smoking status: Current Every Day Smoker -- 1.50 packs/day    Types: Cigarettes  . Smokeless tobacco: Never Used  . Alcohol Use: Yes  . Drug Use: No  . Sexually Active: Not on file   Other Topics Concern  . Not on file   Social History Narrative  . No narrative on file    Objective:  BP 160/78  Pulse 74  Temp(Src) 98.2 F (36.8 C) (Oral)  Resp 16  Wt 162 lb 8 oz (73.71 kg)  BMI 25.45 kg/m2  SpO2 98%  General appearance: alert, cooperative and appears stated age Ears: normal TM's and external ear canals both ears Throat: lips, mucosa, and tongue normal; teeth and gums normal Neck: no adenopathy, no carotid bruit, supple, symmetrical, trachea midline and thyroid  not enlarged, symmetric, no tenderness/mass/nodules Back: symmetric, no curvature. ROM normal. No CVA tenderness. Lungs: clear to auscultation bilaterally Heart: regular rate and rhythm, S1, S2 normal, no murmur, click, rub or gallop Abdomen: soft, non-tender; bowel sounds normal; no masses,  no organomegaly Pulses: 2+ and symmetric Skin: Skin color, texture, turgor normal. No rashes or lesions Lymph nodes: Cervical, supraclavicular, and axillary nodes normal.  Assessment and Plan:  Controlled diabetes mellitus type 2 with complications With CKD and PAD. A1c now 6.0.  Renal function stable.  No changes today.   Reminders for  diabetic eye exam given   Unspecified essential hypertension Uncontrolled.  Increasing hydralazine to 50 mg tid today.    Updated Medication List Outpatient Encounter Prescriptions as of 10/18/2012  Medication Sig Dispense Refill  . amLODipine (NORVASC) 10 MG tablet TAKE 1 TABLET BY MOUTH DAILY  30 tablet  5  . aspirin 81 MG tablet Take 81 mg by mouth daily.      Marland Kitchen atorvastatin (LIPITOR) 20 MG tablet Take 1 tablet (20 mg total) by mouth daily.  90 tablet  0  . CIALIS 20 MG tablet TAKE 1 TABLET BY MOUTH AS NEEDED  30 tablet  0  . hydrALAZINE (APRESOLINE) 50 MG tablet Take 1 tablet (50 mg total) by mouth 3 (three) times daily.  90 tablet  6  . losartan (COZAAR) 100 MG tablet Take 1 tablet (100 mg total) by mouth daily.  90 tablet  3  . [DISCONTINUED] hydrALAZINE (APRESOLINE) 25 MG tablet Take 1 tablet by mouth 3 (three) times daily.      Marland Kitchen lisinopril (PRINIVIL,ZESTRIL) 40 MG tablet Take 1 tablet (40 mg total) by mouth daily.  90 tablet  3  . [DISCONTINUED] aspirin 325 MG tablet Take 325 mg by mouth daily.       No facility-administered encounter medications on file as of 10/18/2012.     Orders Placed This Encounter  Procedures  . Hemoglobin A1c  . HM DIABETES FOOT EXAM    Return in about 3 months (around 01/18/2013).

## 2012-10-18 NOTE — Assessment & Plan Note (Signed)
With CKD and PAD. A1c now 6.0.  Renal function stable.  No changes today.   Reminders for diabetic eye exam given

## 2012-11-29 ENCOUNTER — Encounter: Payer: Self-pay | Admitting: Internal Medicine

## 2012-12-04 DIAGNOSIS — N183 Chronic kidney disease, stage 3 unspecified: Secondary | ICD-10-CM | POA: Diagnosis not present

## 2012-12-04 DIAGNOSIS — R809 Proteinuria, unspecified: Secondary | ICD-10-CM | POA: Diagnosis not present

## 2012-12-04 DIAGNOSIS — N2581 Secondary hyperparathyroidism of renal origin: Secondary | ICD-10-CM | POA: Diagnosis not present

## 2012-12-04 DIAGNOSIS — I10 Essential (primary) hypertension: Secondary | ICD-10-CM | POA: Diagnosis not present

## 2012-12-04 DIAGNOSIS — D631 Anemia in chronic kidney disease: Secondary | ICD-10-CM | POA: Diagnosis not present

## 2012-12-04 DIAGNOSIS — N039 Chronic nephritic syndrome with unspecified morphologic changes: Secondary | ICD-10-CM | POA: Diagnosis not present

## 2012-12-07 ENCOUNTER — Encounter: Payer: Self-pay | Admitting: Nephrology

## 2012-12-11 ENCOUNTER — Ambulatory Visit: Payer: Self-pay | Admitting: Nephrology

## 2012-12-11 DIAGNOSIS — N289 Disorder of kidney and ureter, unspecified: Secondary | ICD-10-CM | POA: Diagnosis not present

## 2013-01-15 ENCOUNTER — Telehealth: Payer: Self-pay | Admitting: *Deleted

## 2013-01-15 DIAGNOSIS — E1059 Type 1 diabetes mellitus with other circulatory complications: Secondary | ICD-10-CM

## 2013-01-15 NOTE — Telephone Encounter (Signed)
Pt is coming in for labs tomorrow 08.26.2014 what labs and dx?

## 2013-01-15 NOTE — Telephone Encounter (Signed)
Please advise of labs needed.

## 2013-01-16 ENCOUNTER — Other Ambulatory Visit: Payer: Medicare Other

## 2013-01-19 ENCOUNTER — Ambulatory Visit: Payer: Medicare Other | Admitting: Internal Medicine

## 2013-01-23 ENCOUNTER — Encounter: Payer: Self-pay | Admitting: Internal Medicine

## 2013-01-23 ENCOUNTER — Ambulatory Visit (INDEPENDENT_AMBULATORY_CARE_PROVIDER_SITE_OTHER): Payer: Medicare Other | Admitting: Internal Medicine

## 2013-01-23 VITALS — BP 158/70 | HR 79 | Temp 97.8°F | Resp 14 | Ht 67.0 in | Wt 163.0 lb

## 2013-01-23 DIAGNOSIS — Z23 Encounter for immunization: Secondary | ICD-10-CM

## 2013-01-23 DIAGNOSIS — E785 Hyperlipidemia, unspecified: Secondary | ICD-10-CM

## 2013-01-23 DIAGNOSIS — E1059 Type 1 diabetes mellitus with other circulatory complications: Secondary | ICD-10-CM

## 2013-01-23 DIAGNOSIS — I10 Essential (primary) hypertension: Secondary | ICD-10-CM

## 2013-01-23 DIAGNOSIS — N183 Chronic kidney disease, stage 3 unspecified: Secondary | ICD-10-CM | POA: Diagnosis not present

## 2013-01-23 DIAGNOSIS — E118 Type 2 diabetes mellitus with unspecified complications: Secondary | ICD-10-CM

## 2013-01-23 LAB — COMPREHENSIVE METABOLIC PANEL
ALT: 11 U/L (ref 0–53)
AST: 15 U/L (ref 0–37)
Albumin: 3.9 g/dL (ref 3.5–5.2)
Alkaline Phosphatase: 148 U/L — ABNORMAL HIGH (ref 39–117)
BUN: 16 mg/dL (ref 6–23)
CO2: 25 mEq/L (ref 19–32)
Calcium: 9.3 mg/dL (ref 8.4–10.5)
Chloride: 106 mEq/L (ref 96–112)
Creatinine, Ser: 1.6 mg/dL — ABNORMAL HIGH (ref 0.4–1.5)
GFR: 45.37 mL/min — ABNORMAL LOW (ref >60.00)
Glucose, Bld: 117 mg/dL — ABNORMAL HIGH (ref 70–99)
Potassium: 4.6 mEq/L (ref 3.5–5.1)
Sodium: 136 mEq/L (ref 135–145)
Total Bilirubin: 0.9 mg/dL (ref 0.3–1.2)
Total Protein: 7.3 g/dL (ref 6.0–8.3)

## 2013-01-23 LAB — LIPID PANEL
Cholesterol: 199 mg/dL (ref 0–200)
HDL: 29.2 mg/dL — ABNORMAL LOW (ref >39.00)
LDL Cholesterol: 132 mg/dL — ABNORMAL HIGH (ref 0–99)
Total CHOL/HDL Ratio: 7
Triglycerides: 188 mg/dL — ABNORMAL HIGH (ref 0.0–149.0)
VLDL: 37.6 mg/dL (ref 0.0–40.0)

## 2013-01-23 LAB — MICROALBUMIN / CREATININE URINE RATIO
Creatinine,U: 36.7 mg/dL
Microalb Creat Ratio: 18.8 mg/g (ref 0.0–30.0)
Microalb, Ur: 6.9 mg/dL — ABNORMAL HIGH (ref 0.0–1.9)

## 2013-01-23 LAB — HM DIABETES EYE EXAM: HM Diabetic Eye Exam: NORMAL

## 2013-01-23 LAB — HEMOGLOBIN A1C: Hgb A1c MFr Bld: 5.9 % (ref 4.6–6.5)

## 2013-01-23 MED ORDER — ATORVASTATIN CALCIUM 40 MG PO TABS: 40.0000 mg | ORAL_TABLET | Freq: Every day | ORAL | Status: DC

## 2013-01-23 NOTE — Progress Notes (Signed)
Patient ID: [name], male   DOB: Oct 02, 1945, 67 y.o.   MRN: [id number]  Patient Active Problem List   Diagnosis Date Noted  . Unspecified essential hypertension 10/18/2012  . Controlled diabetes mellitus type 2 with complications XX123456  . PAD (peripheral artery disease)   . Left hip pain 04/20/2012  . Kidney disease, chronic, stage III (GFR 30-59 ml/min) 04/20/2012  . Erectile dysfunction 04/20/2012  . tobacco abuse   . TIA (transient ischemic attack) 01/15/2012  . Tobacco abuse counseling 05/06/2011  . Hyperlipidemia   . Renal cell carcinoma     Subjective:  CC:   Chief Complaint  Patient presents with  . Follow-up    3 month    HPI:   [name] a 67 y.o. male who presents 3 month follow up on chronic conditions including newly diagnosed diabetes mellitus Type 2., CKD, and PAF.   He is not checking his sugars regularly but is modifying his diet.    One month ago had an episode of extreme weakness that was so profound he left work early and went home. He measure his blood pressure while  at home and his sysoltic was initially 120, on second check 107.  Since then he has modified his anti hypertensive regimen by alternating between his 3 medications and taking only 2 daily .    Past Medical History  Diagnosis Date  . Migraine     cluster  . Obstructive sleep apnea   . Hyperlipidemia   . Hypertension   . TIA (transient ischemic attack) Aug 2013  . tobacco abuse   . Renal cell carcinoma 2004    left kidney heminephrectomy  . 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 PAD (peripheral artery disease) Feb 2009    nonobstructing, renal angiogram [name]    Past Surgical History  Procedure Laterality Date  . Heminephrectomy  2004    for renal cell CA  . Renal cryoablation  Jan 2006    right kidney,  [name]       The following portions of the patient's history were reviewed and updated as appropriate: Allergies,  current medications, and problem list.    Review of Systems:   12 Pt  review of systems was negative except those addressed in the HPI,     History   Social History  . Marital Status: Married    Spouse Name: N/A    Number of Children: N/A  . Years of Education: N/A   Occupational History  . Not on file.   Social History Main Topics  . Smoking status: Current Every Day Smoker -- 1.50 packs/day    Types: Cigarettes  . Smokeless tobacco: Never Used  . Alcohol Use: Yes  . Drug Use: No  . Sexual Activity: Not on file   Other Topics Concern  . Not on file   Social History Narrative  . No narrative on file    Objective:  Filed Vitals:   01/23/13 0910  BP: 158/70  Pulse: 79  Temp: 97.8 F (36.6 C)  Resp: 14     General appearance: alert, cooperative and appears stated age Ears: normal TM's and external ear canals both ears Throat: lips, mucosa, and tongue normal; teeth and gums normal Neck: no adenopathy, no carotid bruit, supple, symmetrical, trachea midline and thyroid not enlarged, symmetric, no tenderness/mass/nodules Back: symmetric, no curvature. ROM normal. No CVA tenderness. Lungs: clear to auscultation bilaterally Heart: regular rate and  rhythm, S1, S2 normal, no murmur, click, rub or gallop Abdomen: soft, non-tender; bowel sounds normal; no masses,  no organomegaly Pulses: 2+ and symmetric Skin: Skin color, texture, turgor normal. No rashes or lesions Lymph nodes: Cervical, supraclavicular, and axillary nodes normal. Foot exam:  Nails are well trimmed,  No callouses,  Sensation intact to microfilament  Assessment and Plan:  Kidney disease, chronic, stage III (GFR 30-59 ml/min) Stable with nephrology evaluation in progress.  Records not currently available for review regarding neprology's recent evaluation and rule out of PCK   Unspecified essential hypertension Complicated by RA on the left, demonstrate by prior cardiac cath.  He reports he feels  poorly when his bp is below 150.70.  Has been alternaitng and taking only 2 of his 3 meds daily for the past month .  Have recommended that he suspend the hydralazine and continue amlodipine and losartan.   Controlled diabetes mellitus type 2 with complications Well-controlled on current medications.  hemoglobin A1c has been consistently less than 7.0 . He is up-to-date on eye exams and his foot exam is norma. l we'll repeat his urine microalbumin to creatinine ratio at next visit. He is on the appropriate medications.  Hyperlipidemia LDL and triglycerides are above goal on  current medications. He has no side effects and liver enzymes are normal. Increase lipitor to 40 m g daily a    Updated Medication List Outpatient Encounter Prescriptions as of 01/23/2013  Medication Sig Dispense Refill  . amLODipine (NORVASC) 10 MG tablet TAKE 1 TABLET BY MOUTH DAILY  30 tablet  5  . aspirin 81 MG tablet Take 81 mg by mouth daily.      Marland Kitchen atorvastatin (LIPITOR) 40 MG tablet Take 1 tablet (40 mg total) by mouth daily.  90 tablet  0  . CIALIS 20 MG tablet TAKE 1 TABLET BY MOUTH AS NEEDED  30 tablet  0  . hydrALAZINE (APRESOLINE) 50 MG tablet Take 1 tablet (50 mg total) by mouth 3 (three) times daily.  90 tablet  6  . losartan (COZAAR) 100 MG tablet Take 1 tablet (100 mg total) by mouth daily.  90 tablet  3  . [DISCONTINUED] atorvastatin (LIPITOR) 20 MG tablet Take 1 tablet (20 mg total) by mouth daily.  90 tablet  0  . [DISCONTINUED] lisinopril (PRINIVIL,ZESTRIL) 40 MG tablet Take 1 tablet (40 mg total) by mouth daily.  90 tablet  3   No facility-administered encounter medications on file as of 01/23/2013.     Orders Placed This Encounter  Procedures  . Pneumococcal polysaccharide vaccine 23-valent greater than or equal to 2yo subcutaneous/IM    No Follow-up on file.

## 2013-01-23 NOTE — Assessment & Plan Note (Addendum)
LDL and triglycerides are above goal on  current medications. He has no side effects and liver enzymes are normal. Increase lipitor to 40 m g daily a

## 2013-01-23 NOTE — Assessment & Plan Note (Addendum)
Complicated by RA on the left, demonstrate by prior cardiac cath.  He reports he feels poorly when his bp is below 150.70.  Has been alternaitng and taking only 2 of his 3 meds daily for the past month .  Have recommended that he suspend the hydralazine and continue amlodipine and losartan.

## 2013-01-23 NOTE — Assessment & Plan Note (Signed)
Well-controlled on current medications.  hemoglobin A1c has been consistently less than 7.0 . He is up-to-date on eye exams and his foot exam is norma. l we'll repeat his urine microalbumin to creatinine ratio at next visit. He is on the appropriate medications. 

## 2013-01-23 NOTE — Patient Instructions (Addendum)
Stop the hydralazine for now.  Continue the losartan and amlodipine daily     Take the hydalazine only when you get home at night

## 2013-01-23 NOTE — Assessment & Plan Note (Addendum)
Stable with nephrology evaluation in progress.  Records not currently available for review regarding neprology's recent evaluation and rule out of [place]

## 2013-02-07 DIAGNOSIS — D239 Other benign neoplasm of skin, unspecified: Secondary | ICD-10-CM

## 2013-02-07 DIAGNOSIS — L820 Inflamed seborrheic keratosis: Secondary | ICD-10-CM | POA: Diagnosis not present

## 2013-02-07 DIAGNOSIS — Z85828 Personal history of other malignant neoplasm of skin: Secondary | ICD-10-CM | POA: Diagnosis not present

## 2013-02-07 DIAGNOSIS — D485 Neoplasm of uncertain behavior of skin: Secondary | ICD-10-CM | POA: Diagnosis not present

## 2013-02-07 DIAGNOSIS — Q809 Congenital ichthyosis, unspecified: Secondary | ICD-10-CM | POA: Diagnosis not present

## 2013-02-07 HISTORY — DX: Other benign neoplasm of skin, unspecified: D23.9

## 2013-02-12 ENCOUNTER — Encounter: Payer: Self-pay | Admitting: *Deleted

## 2013-02-15 ENCOUNTER — Encounter: Payer: Self-pay | Admitting: *Deleted

## 2013-03-06 ENCOUNTER — Other Ambulatory Visit: Payer: Self-pay | Admitting: Internal Medicine

## 2013-04-02 DIAGNOSIS — N183 Chronic kidney disease, stage 3 unspecified: Secondary | ICD-10-CM | POA: Diagnosis not present

## 2013-04-02 DIAGNOSIS — D631 Anemia in chronic kidney disease: Secondary | ICD-10-CM | POA: Diagnosis not present

## 2013-04-02 DIAGNOSIS — N2581 Secondary hyperparathyroidism of renal origin: Secondary | ICD-10-CM | POA: Diagnosis not present

## 2013-04-02 DIAGNOSIS — N289 Disorder of kidney and ureter, unspecified: Secondary | ICD-10-CM | POA: Diagnosis not present

## 2013-04-02 DIAGNOSIS — I10 Essential (primary) hypertension: Secondary | ICD-10-CM | POA: Diagnosis not present

## 2013-04-02 DIAGNOSIS — R809 Proteinuria, unspecified: Secondary | ICD-10-CM | POA: Diagnosis not present

## 2013-04-25 ENCOUNTER — Encounter (INDEPENDENT_AMBULATORY_CARE_PROVIDER_SITE_OTHER): Payer: Self-pay

## 2013-04-25 ENCOUNTER — Encounter: Payer: Self-pay | Admitting: Emergency Medicine

## 2013-04-25 ENCOUNTER — Ambulatory Visit (INDEPENDENT_AMBULATORY_CARE_PROVIDER_SITE_OTHER): Payer: Medicare Other | Admitting: Internal Medicine

## 2013-04-25 ENCOUNTER — Encounter: Payer: Self-pay | Admitting: Internal Medicine

## 2013-04-25 VITALS — BP 164/80 | HR 72 | Temp 98.2°F | Resp 12 | Ht 67.0 in | Wt 171.2 lb

## 2013-04-25 DIAGNOSIS — E785 Hyperlipidemia, unspecified: Secondary | ICD-10-CM | POA: Diagnosis not present

## 2013-04-25 DIAGNOSIS — E119 Type 2 diabetes mellitus without complications: Secondary | ICD-10-CM

## 2013-04-25 DIAGNOSIS — N183 Chronic kidney disease, stage 3 unspecified: Secondary | ICD-10-CM

## 2013-04-25 DIAGNOSIS — I10 Essential (primary) hypertension: Secondary | ICD-10-CM

## 2013-04-25 DIAGNOSIS — Z888 Allergy status to other drugs, medicaments and biological substances status: Secondary | ICD-10-CM

## 2013-04-25 DIAGNOSIS — E118 Type 2 diabetes mellitus with unspecified complications: Secondary | ICD-10-CM

## 2013-04-25 DIAGNOSIS — Z789 Other specified health status: Secondary | ICD-10-CM

## 2013-04-25 DIAGNOSIS — I739 Peripheral vascular disease, unspecified: Secondary | ICD-10-CM

## 2013-04-25 LAB — HM DIABETES FOOT EXAM: HM Diabetic Foot Exam: NORMAL

## 2013-04-25 MED ORDER — TRAMADOL HCL 50 MG PO TABS: 50.0000 mg | ORAL_TABLET | Freq: Three times a day (TID) | ORAL | Status: DC | PRN

## 2013-04-25 MED ORDER — FUROSEMIDE 20 MG PO TABS: 20.0000 mg | ORAL_TABLET | Freq: Every day | ORAL | Status: DC

## 2013-04-25 MED ORDER — ZOSTER VACCINE LIVE 19400 UNT/0.65ML ~~LOC~~ SOLR: 0.6500 mL | Freq: Once | SUBCUTANEOUS | Status: DC

## 2013-04-25 MED ORDER — ROSUVASTATIN CALCIUM 10 MG PO TABS: ORAL_TABLET | ORAL | Status: DC

## 2013-04-25 NOTE — Progress Notes (Signed)
Patient ID: [name], male   DOB: 04/25/1946, 67 y.o.   MRN: [id number]  Patient Active Problem List   Diagnosis Date Noted  . Unspecified essential hypertension 10/18/2012  . Controlled diabetes mellitus type 2 with complications XX123456  . PAD (peripheral artery disease)   . Left hip pain 04/20/2012  . Kidney disease, chronic, stage III (GFR 30-59 ml/min) 04/20/2012  . Erectile dysfunction 04/20/2012  . tobacco abuse   . TIA (transient ischemic attack) 01/15/2012  . Tobacco abuse counseling 05/06/2011  . Hyperlipidemia   . Renal cell carcinoma     Subjective:  CC:   Chief Complaint  Patient presents with  . Follow-up    3 month    HPI:   [name] a 67 y.o. male who presents for follow up on DM, Type 2  hypertension and CKD.   Has been using advil sparingly for right shoulder pain but not daily. He is taking his medications as directed but continues to smoke,  followig a low glycemic index diet most days except during the holidays,  Does not check sugars but has had no episodes sounding like low blood sugars.  Has declined the flu vaccine bc a friend "died after getting it"  3 month follow up on chronic conditions including newly diagnosed diabetes mellitus Type 2., CKD, and PAF.   He is not checking his sugars regularly but is modifying his diet.    One month ago had an episode of extreme weakness that was so profound he left work early and went home. He measure his blood pressure while  at home and his sysoltic was initially 120, on second check 107.  Since then he has modified his anti hypertensive regimen by alternating between his 3 medications and taking only 2 daily .    Past Medical History  Diagnosis Date  . Migraine     cluster  . Obstructive sleep apnea   . Hyperlipidemia   . Hypertension   . TIA (transient ischemic attack) Aug 2013  . tobacco abuse   . Renal cell carcinoma 2004    left kidney heminephrectomy  . Adenocarcinoma of appendix Jan  2006    right kidney, s/p cryoablation  . PAD (peripheral artery disease) Feb 2009    nonobstructing, renal angiogram [name]    Past Surgical History  Procedure Laterality Date  . Heminephrectomy  2004    for renal cell CA  . Renal cryoablation  Jan 2006    right kidney,  [name]       The following portions of the patient's history were reviewed and updated as appropriate: Allergies, current medications, and problem list.    Review of Systems:   12 Pt  review of systems was negative except those addressed in the HPI,     History   Social History  . Marital Status: Married    Spouse Name: N/A    Number of Children: N/A  . Years of Education: N/A   Occupational History  . Not on file.   Social History Main Topics  . Smoking status: Current Every Day Smoker -- 1.50 packs/day    Types: Cigarettes  . Smokeless tobacco: Never Used  . Alcohol Use: Yes  . Drug Use: No  . Sexual Activity: Not on file   Other Topics Concern  . Not on file   Social History Narrative  . No narrative on file    Objective:  Filed Vitals:   04/25/13 1101  BP: 164/80  Pulse: 72  Temp: 98.2 F (36.8 C)  Resp: 12     General appearance: alert, cooperative and appears stated age Ears: normal TM's and external ear canals both ears Throat: lips, mucosa, and tongue normal; teeth and gums normal Neck: no adenopathy, no carotid bruit, supple, symmetrical, trachea midline and thyroid not enlarged, symmetric, no tenderness/mass/nodules Back: symmetric, no curvature. ROM normal. No CVA tenderness. Lungs: clear to auscultation bilaterally Heart: regular rate and rhythm, S1, S2 normal, no murmur, click, rub or gallop Abdomen: soft, non-tender; bowel sounds normal; no masses,  no organomegaly Pulses: 2+ and symmetric Skin: Skin color, texture, turgor normal. No rashes or lesions Lymph nodes: Cervical, supraclavicular, and axillary nodes normal.  Assessment and  Plan:  Hyperlipidemia Risk of AMI in next 10 years is > 25% .  He is Not tolerating lipitor due to muscle aches.   will try crestor every other day   Unspecified essential hypertension Uncontrolled due to reduction in medications for recent hypotensive episode.  Adding furosemide.   PAD (peripheral artery disease) Followed by [facility].  No resting claudication symptoms. Strongly recommended tobacco cessation ,  ASA and use of crestor.   Kidney disease, chronic, stage III (GFR 30-59 ml/min) Stable,  Followed by nephrology.  urged to avoid Advil and all NSAIDs.   Controlled diabetes mellitus type 2 with complications Well-controlled on diet alone .  hemoglobin A1c has been consistently less than 7.0 . Patient is up-to-date on eye exams and foot exam was done today.  He has CKD and PAD .  Fasting lipids have been reviewed and statin therapy has been advised and updated according to new ACC guidelines based on patient's very elevated 10 year risk of CAD   A total of 40 minutes was spent with patient more than half of which was spent in counseling, reviewing records from other prviders and coordination of care.  Updated Medication List Outpatient Encounter Prescriptions as of 04/25/2013  Medication Sig  . amLODipine (NORVASC) 10 MG tablet TAKE 1 TABLET BY MOUTH EVERY DAY  . ammonium lactate (LAC-HYDRIN) 12 % lotion Apply 1 application topically daily.  Marland Kitchen aspirin 81 MG tablet Take 81 mg by mouth daily.  Marland Kitchen CIALIS 20 MG tablet TAKE 1 TABLET BY MOUTH AS NEEDED  . losartan (COZAAR) 100 MG tablet Take 1 tablet (100 mg total) by mouth daily.  . furosemide (LASIX) 20 MG tablet Take 1 tablet (20 mg total) by mouth daily.  . rosuvastatin (CRESTOR) 10 MG tablet One tablet Every other day at bedtime  . traMADol (ULTRAM) 50 MG tablet Take 1 tablet (50 mg total) by mouth every 8 (eight) hours as needed.  . zoster vaccine live, PF, (ZOSTAVAX) 09811 UNT/0.65ML injection Inject 19,400 Units into the skin once.   . [DISCONTINUED] atorvastatin (LIPITOR) 40 MG tablet Take 1 tablet (40 mg total) by mouth daily.  . [DISCONTINUED] hydrALAZINE (APRESOLINE) 50 MG tablet Take 1 tablet (50 mg total) by mouth 3 (three) times daily.     Orders Placed This Encounter  Procedures  . Ambulatory referral to Ophthalmology    Return in about 3 months (around 07/24/2013).

## 2013-04-25 NOTE — Patient Instructions (Addendum)
I am adding daily furosemide to your current medications for blood pressure  I am adding tramadol for pain control for your shoulder.  You can take up to 3 daily if you need to.\  Your have declined the flu vaccine today.  I do advise getting the shingles vaccine   Try taking the crestor samples every other day for your cholesterol..  We will refill if you tolerate it    Smoking Cessation Quitting smoking is important to your health and has many advantages. However, it is not always easy to quit since nicotine is a very addictive drug. Often times, people try 3 times or more before being able to quit. This document explains the best ways for you to prepare to quit smoking. Quitting takes hard work and a lot of effort, but you can do it. ADVANTAGES OF QUITTING SMOKING  You will live longer, feel better, and live better.  Your body will feel the impact of quitting smoking almost immediately.  Within 20 minutes, blood pressure decreases. Your pulse returns to its normal level.  After 8 hours, carbon monoxide levels in the blood return to normal. Your oxygen level increases.  After 24 hours, the chance of having a heart attack starts to decrease. Your breath, hair, and body stop smelling like smoke.  After 48 hours, damaged nerve endings begin to recover. Your sense of taste and smell improve.  After 72 hours, the body is virtually free of nicotine. Your bronchial tubes relax and breathing becomes easier.  After 2 to 12 weeks, lungs can hold more air. Exercise becomes easier and circulation improves.  The risk of having a heart attack, stroke, cancer, or lung disease is greatly reduced.  After 1 year, the risk of coronary heart disease is cut in half.  After 5 years, the risk of stroke falls to the same as a nonsmoker.  After 10 years, the risk of lung cancer is cut in half and the risk of other cancers decreases significantly.  After 15 years, the risk of coronary heart disease  drops, usually to the level of a nonsmoker.  If you are pregnant, quitting smoking will improve your chances of having a healthy baby.  The people you live with, especially any children, will be healthier.  You will have extra money to spend on things other than cigarettes. QUESTIONS TO THINK ABOUT BEFORE ATTEMPTING TO QUIT You may want to talk about your answers with your caregiver.  Why do you want to quit?  If you tried to quit in the past, what helped and what did not?  What will be the most difficult situations for you after you quit? How will you plan to handle them?  Who can help you through the tough times? Your family? Friends? A caregiver?  What pleasures do you get from smoking? What ways can you still get pleasure if you quit? Here are some questions to ask your caregiver:  How can you help me to be successful at quitting?  What medicine do you think would be best for me and how should I take it?  What should I do if I need more help?  What is smoking withdrawal like? How can I get information on withdrawal? GET READY  Set a quit date.  Change your environment by getting rid of all cigarettes, ashtrays, matches, and lighters in your home, car, or work. Do not let people smoke in your home.  Review your past attempts to quit. Think about what worked  and what did not. GET SUPPORT AND ENCOURAGEMENT You have a better chance of being successful if you have help. You can get support in many ways.  Tell your family, friends, and co-workers that you are going to quit and need their support. Ask them not to smoke around you.  Get individual, group, or telephone counseling and support. Programs are available at [employer] and health centers. Call your local health department for information about programs in your area.  Spiritual beliefs and practices may help some smokers quit.  Download a "quit meter" on your computer to keep track of quit statistics, such as how  long you have gone without smoking, cigarettes not smoked, and money saved.  Get a self-help book about quitting smoking and staying off of tobacco. [place] yourself from urges to smoke. Talk to someone, go for a walk, or occupy your time with a task.  Change your normal routine. Take a different route to work. Drink tea instead of coffee. Eat breakfast in a different place.  Reduce your stress. Take a hot bath, exercise, or read a book.  Plan something enjoyable to do every day. Reward yourself for not smoking.  Explore interactive web-based programs that specialize in helping you quit. GET MEDICINE AND USE IT CORRECTLY Medicines can help you stop smoking and decrease the urge to smoke. Combining medicine with the above behavioral methods and support can greatly increase your chances of successfully quitting smoking.  Nicotine replacement therapy helps deliver nicotine to your body without the negative effects and risks of smoking. Nicotine replacement therapy includes nicotine gum, lozenges, inhalers, nasal sprays, and skin patches. Some may be available over-the-counter and others require a prescription.  Antidepressant medicine helps people abstain from smoking, but how this works is unknown. This medicine is available by prescription.  Nicotinic receptor partial agonist medicine simulates the effect of nicotine in your brain. This medicine is available by prescription. Ask your caregiver for advice about which medicines to use and how to use them based on your health history. Your caregiver will tell you what side effects to look out for if you choose to be on a medicine or therapy. Carefully read the information on the package. Do not use any other product containing nicotine while using a nicotine replacement product.  RELAPSE OR DIFFICULT SITUATIONS Most relapses occur within the first 3 months after quitting. Do not be discouraged if you start smoking  again. Remember, most people try several times before finally quitting. You may have symptoms of withdrawal because your body is used to nicotine. You may crave cigarettes, be irritable, feel very hungry, cough often, get headaches, or have difficulty concentrating. The withdrawal symptoms are only temporary. They are strongest when you first quit, but they will go away within 10 14 days. To reduce the chances of relapse, try to:  Avoid drinking alcohol. Drinking lowers your chances of successfully quitting.  Reduce the amount of caffeine you consume. Once you quit smoking, the amount of caffeine in your body increases and can give you symptoms, such as a rapid heartbeat, sweating, and anxiety.  Avoid smokers because they can make you want to smoke.  Do not let weight gain distract you. Many smokers will gain weight when they quit, usually less than 10 pounds. Eat a healthy diet and stay active. You can always lose the weight gained after you quit.  Find ways to improve your mood other than smoking. FOR MORE INFORMATION  www.smokefree.gov  Document Released: 05/04/2001 Document Revised: 11/09/2011 Document Reviewed: 08/19/2011 [facility] Patient Information 2014 [place], [place].

## 2013-04-25 NOTE — Assessment & Plan Note (Addendum)
Risk of AMI in next 10 years is > 25% .  He is Not tolerating lipitor due to muscle aches.   will try crestor every other day

## 2013-04-26 DIAGNOSIS — Z789 Other specified health status: Secondary | ICD-10-CM | POA: Insufficient documentation

## 2013-04-26 LAB — CBC WITH DIFFERENTIAL/PLATELET
Basophils Absolute: 0 10*3/uL (ref 0.0–0.1)
Basophils Relative: 0.3 % (ref 0.0–3.0)
Eosinophils Absolute: 0.4 10*3/uL (ref 0.0–0.7)
Eosinophils Relative: 4 % (ref 0.0–5.0)
HCT: 40.8 % (ref 39.0–52.0)
Hemoglobin: 13.9 g/dL (ref 13.0–17.0)
Lymphocytes Relative: 28.7 % (ref 12.0–46.0)
Lymphs Abs: 2.6 10*3/uL (ref 0.7–4.0)
MCHC: 34.2 g/dL (ref 30.0–36.0)
MCV: 86 fl (ref 78.0–100.0)
Monocytes Absolute: 0.4 10*3/uL (ref 0.1–1.0)
Monocytes Relative: 4.2 % (ref 3.0–12.0)
Neutro Abs: 5.7 10*3/uL (ref 1.4–7.7)
Neutrophils Relative %: 62.8 % (ref 43.0–77.0)
Platelets: 246 10*3/uL (ref 150.0–400.0)
RBC: 4.75 Mil/uL (ref 4.22–5.81)
RDW: 12.8 % (ref 11.5–14.6)
WBC: 9.1 10*3/uL (ref 4.5–10.5)

## 2013-04-26 LAB — COMPREHENSIVE METABOLIC PANEL
ALT: 13 U/L (ref 0–53)
AST: 13 U/L (ref 0–37)
Albumin: 3.8 g/dL (ref 3.5–5.2)
Alkaline Phosphatase: 129 U/L — ABNORMAL HIGH (ref 39–117)
BUN: 16 mg/dL (ref 6–23)
CO2: 28 mEq/L (ref 19–32)
Calcium: 9.2 mg/dL (ref 8.4–10.5)
Chloride: 103 mEq/L (ref 96–112)
Creatinine, Ser: 1.6 mg/dL — ABNORMAL HIGH (ref 0.4–1.5)
GFR: 44.7 mL/min — ABNORMAL LOW (ref >60.00)
Glucose, Bld: 59 mg/dL — ABNORMAL LOW (ref 70–99)
Potassium: 4.8 mEq/L (ref 3.5–5.1)
Sodium: 137 mEq/L (ref 135–145)
Total Bilirubin: 0.7 mg/dL (ref 0.3–1.2)
Total Protein: 7.1 g/dL (ref 6.0–8.3)

## 2013-04-26 LAB — HEMOGLOBIN A1C: Hgb A1c MFr Bld: 6.3 % (ref 4.6–6.5)

## 2013-04-26 NOTE — Assessment & Plan Note (Signed)
Stable,  Followed by nephrology.  urged to avoid Advil and all NSAIDs.

## 2013-04-26 NOTE — Assessment & Plan Note (Signed)
Uncontrolled due to reduction in medications for recent hypotensive episode.  Adding furosemide.

## 2013-04-26 NOTE — Assessment & Plan Note (Signed)
Strongly recommended tgobacco cessation ,  ASA and use of crestor.

## 2013-04-26 NOTE — Assessment & Plan Note (Signed)
Well-controlled on diet alone .  hemoglobin A1c has been consistently less than 7.0 . Patient is up-to-date on eye exams and foot exam was done today.  He has CKD and PAD .  Fasting lipids have been reviewed and statin therapy has been advised and updated according to new ACC guidelines based on patient's very elevated 10 year risk of CAD

## 2013-04-27 ENCOUNTER — Encounter: Payer: Self-pay | Admitting: *Deleted

## 2013-05-15 DIAGNOSIS — H2510 Age-related nuclear cataract, unspecified eye: Secondary | ICD-10-CM | POA: Diagnosis not present

## 2013-06-20 ENCOUNTER — Other Ambulatory Visit: Payer: Self-pay | Admitting: Internal Medicine

## 2013-07-25 ENCOUNTER — Telehealth: Payer: Self-pay | Admitting: *Deleted

## 2013-07-25 DIAGNOSIS — R5381 Other malaise: Secondary | ICD-10-CM

## 2013-07-25 DIAGNOSIS — R5383 Other fatigue: Secondary | ICD-10-CM

## 2013-07-25 DIAGNOSIS — E1159 Type 2 diabetes mellitus with other circulatory complications: Secondary | ICD-10-CM

## 2013-07-25 DIAGNOSIS — Z125 Encounter for screening for malignant neoplasm of prostate: Secondary | ICD-10-CM

## 2013-07-25 NOTE — Telephone Encounter (Signed)
Pt is coming in tomorrow what labs and dx?  

## 2013-07-26 ENCOUNTER — Other Ambulatory Visit: Payer: Self-pay | Admitting: Internal Medicine

## 2013-07-26 ENCOUNTER — Other Ambulatory Visit (INDEPENDENT_AMBULATORY_CARE_PROVIDER_SITE_OTHER): Payer: Medicare Other

## 2013-07-26 DIAGNOSIS — R5381 Other malaise: Secondary | ICD-10-CM | POA: Diagnosis not present

## 2013-07-26 DIAGNOSIS — R5383 Other fatigue: Secondary | ICD-10-CM | POA: Diagnosis not present

## 2013-07-26 DIAGNOSIS — Z125 Encounter for screening for malignant neoplasm of prostate: Secondary | ICD-10-CM | POA: Diagnosis not present

## 2013-07-26 DIAGNOSIS — E1159 Type 2 diabetes mellitus with other circulatory complications: Secondary | ICD-10-CM | POA: Diagnosis not present

## 2013-07-26 LAB — COMPREHENSIVE METABOLIC PANEL
ALT: 17 U/L (ref 0–53)
AST: 12 U/L (ref 0–37)
Albumin: 3.5 g/dL (ref 3.5–5.2)
Alkaline Phosphatase: 137 U/L — ABNORMAL HIGH (ref 39–117)
BUN: 17 mg/dL (ref 6–23)
CO2: 25 mEq/L (ref 19–32)
Calcium: 9.4 mg/dL (ref 8.4–10.5)
Chloride: 110 mEq/L (ref 96–112)
Creatinine, Ser: 1.6 mg/dL — ABNORMAL HIGH (ref 0.4–1.5)
GFR: 45.95 mL/min — ABNORMAL LOW (ref >60.00)
Glucose, Bld: 120 mg/dL — ABNORMAL HIGH (ref 70–99)
Potassium: 4.3 mEq/L (ref 3.5–5.1)
Sodium: 140 mEq/L (ref 135–145)
Total Bilirubin: 1 mg/dL (ref 0.3–1.2)
Total Protein: 6.6 g/dL (ref 6.0–8.3)

## 2013-07-26 LAB — LIPID PANEL
Cholesterol: 158 mg/dL (ref 0–200)
HDL: 27.5 mg/dL — ABNORMAL LOW (ref >39.00)
LDL Cholesterol: 92 mg/dL (ref 0–99)
Total CHOL/HDL Ratio: 6
Triglycerides: 195 mg/dL — ABNORMAL HIGH (ref 0.0–149.0)
VLDL: 39 mg/dL (ref 0.0–40.0)

## 2013-07-26 LAB — TSH: TSH: 3.82 u[IU]/mL (ref 0.35–5.50)

## 2013-07-26 LAB — HEMOGLOBIN A1C: Hgb A1c MFr Bld: 6.5 % (ref 4.6–6.5)

## 2013-07-26 LAB — PSA, MEDICARE: PSA: 4.34 ng/ml — ABNORMAL HIGH (ref 0.10–4.00)

## 2013-07-27 LAB — VITAMIN D 25 HYDROXY (VIT D DEFICIENCY, FRACTURES): Vit D, 25-Hydroxy: 46 ng/mL (ref 30–89)

## 2013-07-30 ENCOUNTER — Encounter: Payer: Self-pay | Admitting: Internal Medicine

## 2013-07-30 ENCOUNTER — Telehealth: Payer: Self-pay | Admitting: Internal Medicine

## 2013-07-30 ENCOUNTER — Ambulatory Visit (INDEPENDENT_AMBULATORY_CARE_PROVIDER_SITE_OTHER): Payer: Medicare Other | Admitting: Internal Medicine

## 2013-07-30 VITALS — BP 164/90 | HR 69 | Temp 98.2°F | Resp 18 | Wt 170.2 lb

## 2013-07-30 DIAGNOSIS — R972 Elevated prostate specific antigen [PSA]: Secondary | ICD-10-CM

## 2013-07-30 DIAGNOSIS — R748 Abnormal levels of other serum enzymes: Secondary | ICD-10-CM

## 2013-07-30 DIAGNOSIS — I739 Peripheral vascular disease, unspecified: Secondary | ICD-10-CM | POA: Diagnosis not present

## 2013-07-30 DIAGNOSIS — C61 Malignant neoplasm of prostate: Secondary | ICD-10-CM | POA: Insufficient documentation

## 2013-07-30 DIAGNOSIS — N183 Chronic kidney disease, stage 3 unspecified: Secondary | ICD-10-CM

## 2013-07-30 DIAGNOSIS — E119 Type 2 diabetes mellitus without complications: Secondary | ICD-10-CM

## 2013-07-30 DIAGNOSIS — E118 Type 2 diabetes mellitus with unspecified complications: Secondary | ICD-10-CM

## 2013-07-30 LAB — POCT URINALYSIS DIPSTICK
Bilirubin, UA: NEGATIVE
Blood, UA: NEGATIVE
Glucose, UA: NEGATIVE
Ketones, UA: NEGATIVE
Leukocytes, UA: NEGATIVE
Nitrite, UA: NEGATIVE
Protein, UA: 100
Spec Grav, UA: 1.01
Urobilinogen, UA: 0.2
pH, UA: 7

## 2013-07-30 LAB — HM DIABETES FOOT EXAM: HM Diabetic Foot Exam: NORMAL

## 2013-07-30 LAB — ALKALINE PHOSPHATASE: Alkaline Phosphatase: 142 U/L — ABNORMAL HIGH (ref 39–117)

## 2013-07-30 LAB — MICROALBUMIN / CREATININE URINE RATIO
Creatinine,U: 35.5 mg/dL
Microalb Creat Ratio: 86.2 mg/g — ABNORMAL HIGH (ref 0.0–30.0)
Microalb, Ur: 30.6 mg/dL — ABNORMAL HIGH (ref 0.0–1.9)

## 2013-07-30 NOTE — Telephone Encounter (Signed)
Pt states dr Derrel Nip referred [facility] to the [facility].  They take labs every 6 months and he wanted to know if he could get those labs done here. He stated the lab tech there has a hard time getting his blood and it hurt.  He stated carolyn was great taking his labs.

## 2013-07-30 NOTE — Progress Notes (Signed)
Pre-visit discussion using our clinic review tool. No additional management support is needed unless otherwise documented below in the visit note.  

## 2013-07-30 NOTE — Assessment & Plan Note (Signed)
Stable for 1 year.  No changes today to BP med's given patient's history of intolerance

## 2013-07-30 NOTE — Assessment & Plan Note (Signed)
Persistnet, noted prir to use of statin . abd ultrasound 2014 was negative for gallstones. Check fractionated to look for bone enzyme.  If normal,  Will repeat ultrasound.

## 2013-07-30 NOTE — Progress Notes (Signed)
Patient ID: [name], male   DOB: [birth year], 68 y.o.   MRN: [id number]  Patient Active Problem List   Diagnosis Date Noted  . Elevated alkaline phosphatase measurement 07/30/2013  . Elevated PSA, less than 10 ng/ml 07/30/2013  . Statin intolerance 04/26/2013  . Unspecified essential hypertension 10/18/2012  . Controlled diabetes mellitus type 2 with complications [id number]  . PAD (peripheral artery disease)   . Left hip pain 04/20/2012  . Kidney disease, chronic, stage III (GFR 30-59 ml/min) 04/20/2012  . Erectile dysfunction 04/20/2012  . tobacco abuse   . TIA (transient ischemic attack) 01/15/2012  . Tobacco abuse counseling 05/06/2011  . Hyperlipidemia   . Renal cell carcinoma     Subjective:  CC:   Chief Complaint  Patient presents with  . Follow-up    3 month folow up    HPI:   [name] is a 68 y.o. male who presents for   Past Medical History  Diagnosis Date  . Migraine     cluster  . Obstructive sleep apnea   . Hyperlipidemia   . Hypertension   . TIA (transient ischemic attack) Aug 2013  . tobacco abuse   . Renal cell carcinoma 2004    left kidney heminephrectomy  . 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 PAD (peripheral artery disease) Feb 2009    nonobstructing, renal angiogram [name])    Past Surgical History  Procedure Laterality Date  . Heminephrectomy  2004    for renal cell CA  . Renal cryoablation  Jan 2006    right kidney,  [name]       The following portions of the patient's history were reviewed and updated as appropriate: Allergies, current medications, and problem list.    Review of Systems:   Patient denies headache, fevers, malaise, unintentional weight loss, skin rash, eye pain, sinus congestion and sinus pain, sore throat, dysphagia,  hemoptysis , cough, dyspnea, wheezing, chest pain, palpitations, orthopnea, edema, abdominal pain, nausea, melena, diarrhea, constipation, flank  pain, dysuria, hematuria, urinary  Frequency, nocturia, numbness, tingling, seizures,  Focal weakness, Loss of consciousness,  Tremor, insomnia, depression, anxiety, and suicidal ideation.     History   Social History  . Marital Status: Married    Spouse Name: N/A    Number of Children: N/A  . Years of Education: N/A   Occupational History  . Not on file.   Social History Main Topics  . Smoking status: Current Every Day Smoker -- 1.50 packs/day    Types: Cigarettes  . Smokeless tobacco: Never Used  . Alcohol Use: Yes  . Drug Use: No  . Sexual Activity: Not on file   Other Topics Concern  . Not on file   Social History Narrative  . No narrative on file    Objective:  Filed Vitals:   07/30/13 0926  BP: 164/90  Pulse:   Temp:   Resp:      General appearance: alert, cooperative and appears stated age Ears: normal TM's and external ear canals both ears Throat: lips, mucosa, and tongue normal; teeth and gums normal Neck: no adenopathy, no carotid bruit, supple, symmetrical, trachea midline and thyroid not enlarged, symmetric, no tenderness/mass/nodules Back: symmetric, no curvature. ROM normal. No CVA tenderness. Lungs: clear to auscultation bilaterally Heart: regular rate and rhythm, S1, S2 normal, no murmur, click, rub or gallop Abdomen: soft, non-tender; bowel sounds normal; no masses,  no organomegaly Pulses: 2+ and symmetric Skin:  Skin color, texture, turgor normal. No rashes or lesions Lymph nodes: Cervical, supraclavicular, and axillary nodes normal.  Assessment and Plan:  PAD (peripheral artery disease) No claudication symptoms  and DP pulses are strong. Continue aspirin and crestor  Kidney disease, chronic, stage III (GFR 30-59 ml/min) Stable for 1 year.  No changes today to BP med's given patient's history of intolerance  Controlled diabetes mellitus type 2 with complications Well-controlled on diet alone .  hemoglobin A1c has been consistently less  than 7.0 . Patient is up-to-date on eye exams and foot exam was done today.  He has CKD and PAD .  Fasting lipids have been reviewed and statin therapy has been advised and updated according to new ACC guidelines based on patient's very elevated 10 year risk of CAD     Elevated alkaline phosphatase measurement Persistnet, noted prir to use of statin . abd ultrasound 2014 was negative for gallstones. Check fractionated to look for bone enzyme.  If normal,  Will repeat ultrasound.   Elevated PSA, less than 10 ng/ml referring to [name] for evaluation if UA is normal today    A total of 40 minutes was spent with patient more than half of which was spent in counseling, reviewing records from other prviders and coordination of care.  Updated Medication List Outpatient Encounter Prescriptions as of 07/30/2013  Medication Sig  . amLODipine (NORVASC) 10 MG tablet TAKE 1 TABLET BY MOUTH EVERY DAY  . ammonium lactate (LAC-HYDRIN) 12 % lotion Apply 1 application topically daily.  Marland Kitchen aspirin 81 MG tablet Take 81 mg by mouth daily.  . furosemide (LASIX) 20 MG tablet Take 1 tablet (20 mg total) by mouth daily.  Marland Kitchen losartan (COZAAR) 100 MG tablet TAKE 1 TABLET BY MOUTH EVERY DAY  . rosuvastatin (CRESTOR) 10 MG tablet One tablet Every other day at bedtime  . traMADol (ULTRAM) 50 MG tablet Take 1 tablet (50 mg total) by mouth every 8 (eight) hours as needed.  Marland Kitchen CIALIS 20 MG tablet TAKE 1 TABLET BY MOUTH AS NEEDED  . zoster vaccine live, PF, (ZOSTAVAX) 60454 UNT/0.65ML injection Inject 19,400 Units into the skin once.     Orders Placed This Encounter  Procedures  . CULTURE, URINE COMPREHENSIVE  . Microalbumin / creatinine urine ratio  . Alkaline phosphatase  . POCT Urinalysis Dipstick  . HM DIABETES FOOT EXAM    No Follow-up on file.

## 2013-07-30 NOTE — Assessment & Plan Note (Signed)
No claudication symptoms  and DP pulses are strong. Continue aspirin and crestor

## 2013-07-30 NOTE — Patient Instructions (Addendum)
Referral to Dr cope to evaluate your elevated PSA   Diabetes and Exercise Exercising regularly is important. It is not just about losing weight. It has many health benefits, such as:  Improving your overall fitness, flexibility, and endurance.  Increasing your bone density.  Helping with weight control.  Decreasing your body fat.  Increasing your muscle strength.  Reducing stress and tension.  Improving your overall health. People with diabetes who exercise gain additional benefits because exercise:  Reduces appetite.  Improves the body's use of blood sugar (glucose).  Helps lower or control blood glucose.  Decreases blood pressure.  Helps control blood lipids (such as cholesterol and triglycerides).  Improves the body's use of the hormone insulin by:  Increasing the body's insulin sensitivity.  Reducing the body's insulin needs.  Decreases the risk for heart disease because exercising:  Lowers cholesterol and triglycerides levels.  Increases the levels of good cholesterol (such as high-density lipoproteins [HDL]) in the body.  Lowers blood glucose levels. YOUR ACTIVITY PLAN  Choose an activity that you enjoy and set realistic goals. Your health care provider or diabetes educator can help you make an activity plan that works for you. You can break activities into 2 or 3 sessions throughout the day. Doing so is as good as one long session. Exercise ideas include:  Taking the dog for a walk.  Taking the stairs instead of the elevator.  Dancing to your favorite song.  Doing your favorite exercise with a friend. RECOMMENDATIONS FOR EXERCISING WITH TYPE 1 OR TYPE 2 DIABETES   Check your blood glucose before exercising. If blood glucose levels are greater than 240 mg/dL, check for urine ketones. Do not exercise if ketones are present.  Avoid injecting insulin into areas of the body that are going to be exercised. For example, avoid injecting insulin into:  The  arms when playing tennis.  The legs when jogging.  Keep a record of:  Food intake before and after you exercise.  Expected peak times of insulin action.  Blood glucose levels before and after you exercise.  The type and amount of exercise you have done.  Review your records with your health care provider. Your health care provider will help you to develop guidelines for adjusting food intake and insulin amounts before and after exercising.  If you take insulin or oral hypoglycemic agents, watch for signs and symptoms of hypoglycemia. They include:  Dizziness.  Shaking.  Sweating.  Chills.  Confusion.  Drink plenty of water while you exercise to prevent dehydration or heat stroke. Body water is lost during exercise and must be replaced.  Talk to your health care provider before starting an exercise program to make sure it is safe for you. Remember, almost any type of activity is better than none. Document Released: 07/31/2003 Document Revised: 01/10/2013 Document Reviewed: 10/17/2012 [facility] Patient Information 2014 [place].

## 2013-07-30 NOTE — Assessment & Plan Note (Signed)
referring to Dr Jacqlyn Larsen for evaluation if UA is normal today

## 2013-07-30 NOTE — Assessment & Plan Note (Signed)
Well-controlled on diet alone .  hemoglobin A1c has been consistently less than 7.0 . Patient is up-to-date on eye exams and foot exam was done today.  He has CKD and PAD .  Fasting lipids have been reviewed and statin therapy has been advised and updated according to new ACC guidelines based on patient's very elevated 10 year risk of CAD

## 2013-07-31 ENCOUNTER — Telehealth: Payer: Self-pay | Admitting: Internal Medicine

## 2013-07-31 LAB — CULTURE, URINE COMPREHENSIVE
Colony Count: NO GROWTH
Organism ID, Bacteria: NO GROWTH

## 2013-07-31 NOTE — Telephone Encounter (Signed)
Yes, we just need to knw what labs they need drawn

## 2013-07-31 NOTE — Telephone Encounter (Signed)
Relevant patient education mailed to patient.  

## 2013-07-31 NOTE — Telephone Encounter (Signed)
Pt notified and will provide labs needed.

## 2013-07-31 NOTE — Telephone Encounter (Signed)
Please advise 

## 2013-08-01 ENCOUNTER — Other Ambulatory Visit: Payer: Self-pay | Admitting: *Deleted

## 2013-08-01 ENCOUNTER — Other Ambulatory Visit: Payer: Self-pay | Admitting: Internal Medicine

## 2013-08-01 ENCOUNTER — Telehealth: Payer: Self-pay | Admitting: *Deleted

## 2013-08-01 DIAGNOSIS — R748 Abnormal levels of other serum enzymes: Secondary | ICD-10-CM

## 2013-08-01 DIAGNOSIS — R972 Elevated prostate specific antigen [PSA]: Secondary | ICD-10-CM

## 2013-08-01 NOTE — Progress Notes (Signed)
What is the test called that you wanted?

## 2013-08-01 NOTE — Telephone Encounter (Signed)
Sorry about that, called [facility] for them to run the correct order (Alkaline Phosphatase Isoenzyme: order [id number])

## 2013-08-01 NOTE — Telephone Encounter (Signed)
Isoenzymes for alk phosphatase

## 2013-08-01 NOTE — Telephone Encounter (Signed)
What is the test called that you wanted?

## 2013-08-08 ENCOUNTER — Encounter: Payer: Self-pay | Admitting: *Deleted

## 2013-08-08 LAB — ALKALINE PHOSPHATASE ISOENZYMES
Alkaline Phonsphatase: 157 U/L — ABNORMAL HIGH (ref 40–115)
Bone Isoenzymes: 25 % — ABNORMAL LOW (ref 28–66)
Intestinal Isoenzymes: 2 % (ref 1–24)
Liver Isoenzymes: 73 % — ABNORMAL HIGH (ref 25–69)
Macrohepatic isoenzymes: 0 %

## 2013-08-09 DIAGNOSIS — Z85828 Personal history of other malignant neoplasm of skin: Secondary | ICD-10-CM | POA: Diagnosis not present

## 2013-08-09 DIAGNOSIS — D239 Other benign neoplasm of skin, unspecified: Secondary | ICD-10-CM | POA: Diagnosis not present

## 2013-08-09 DIAGNOSIS — L738 Other specified follicular disorders: Secondary | ICD-10-CM | POA: Diagnosis not present

## 2013-08-09 DIAGNOSIS — Q809 Congenital ichthyosis, unspecified: Secondary | ICD-10-CM | POA: Diagnosis not present

## 2013-08-16 ENCOUNTER — Encounter: Payer: Self-pay | Admitting: Emergency Medicine

## 2013-09-07 DIAGNOSIS — M79609 Pain in unspecified limb: Secondary | ICD-10-CM | POA: Diagnosis not present

## 2013-09-07 DIAGNOSIS — I10 Essential (primary) hypertension: Secondary | ICD-10-CM | POA: Diagnosis not present

## 2013-09-07 DIAGNOSIS — I739 Peripheral vascular disease, unspecified: Secondary | ICD-10-CM | POA: Diagnosis not present

## 2013-09-07 DIAGNOSIS — I6529 Occlusion and stenosis of unspecified carotid artery: Secondary | ICD-10-CM | POA: Diagnosis not present

## 2013-09-08 LAB — HM DIABETES EYE EXAM

## 2013-09-10 DIAGNOSIS — R972 Elevated prostate specific antigen [PSA]: Secondary | ICD-10-CM | POA: Diagnosis not present

## 2013-09-10 DIAGNOSIS — N529 Male erectile dysfunction, unspecified: Secondary | ICD-10-CM | POA: Diagnosis not present

## 2013-09-10 DIAGNOSIS — Z85528 Personal history of other malignant neoplasm of kidney: Secondary | ICD-10-CM | POA: Diagnosis not present

## 2013-10-01 DIAGNOSIS — N289 Disorder of kidney and ureter, unspecified: Secondary | ICD-10-CM | POA: Diagnosis not present

## 2013-10-01 DIAGNOSIS — D631 Anemia in chronic kidney disease: Secondary | ICD-10-CM | POA: Diagnosis not present

## 2013-10-01 DIAGNOSIS — N039 Chronic nephritic syndrome with unspecified morphologic changes: Secondary | ICD-10-CM | POA: Diagnosis not present

## 2013-10-01 DIAGNOSIS — N183 Chronic kidney disease, stage 3 unspecified: Secondary | ICD-10-CM | POA: Diagnosis not present

## 2013-10-01 DIAGNOSIS — N2581 Secondary hyperparathyroidism of renal origin: Secondary | ICD-10-CM | POA: Diagnosis not present

## 2013-10-01 DIAGNOSIS — I10 Essential (primary) hypertension: Secondary | ICD-10-CM | POA: Diagnosis not present

## 2013-10-09 ENCOUNTER — Other Ambulatory Visit: Payer: Self-pay | Admitting: Internal Medicine

## 2013-10-09 DIAGNOSIS — R972 Elevated prostate specific antigen [PSA]: Secondary | ICD-10-CM | POA: Diagnosis not present

## 2013-10-29 DIAGNOSIS — R972 Elevated prostate specific antigen [PSA]: Secondary | ICD-10-CM | POA: Diagnosis not present

## 2013-11-08 ENCOUNTER — Ambulatory Visit (INDEPENDENT_AMBULATORY_CARE_PROVIDER_SITE_OTHER): Payer: Medicare Other | Admitting: Internal Medicine

## 2013-11-08 ENCOUNTER — Encounter: Payer: Self-pay | Admitting: Internal Medicine

## 2013-11-08 VITALS — BP 150/78 | HR 62 | Temp 98.2°F | Resp 16 | Ht 67.0 in | Wt 168.2 lb

## 2013-11-08 DIAGNOSIS — N2889 Other specified disorders of kidney and ureter: Secondary | ICD-10-CM

## 2013-11-08 DIAGNOSIS — Z716 Tobacco abuse counseling: Secondary | ICD-10-CM

## 2013-11-08 DIAGNOSIS — E118 Type 2 diabetes mellitus with unspecified complications: Secondary | ICD-10-CM | POA: Diagnosis not present

## 2013-11-08 DIAGNOSIS — N183 Chronic kidney disease, stage 3 unspecified: Secondary | ICD-10-CM | POA: Diagnosis not present

## 2013-11-08 DIAGNOSIS — F17200 Nicotine dependence, unspecified, uncomplicated: Secondary | ICD-10-CM

## 2013-11-08 DIAGNOSIS — Z7189 Other specified counseling: Secondary | ICD-10-CM

## 2013-11-08 DIAGNOSIS — E875 Hyperkalemia: Secondary | ICD-10-CM

## 2013-11-08 DIAGNOSIS — G459 Transient cerebral ischemic attack, unspecified: Secondary | ICD-10-CM

## 2013-11-08 DIAGNOSIS — I10 Essential (primary) hypertension: Secondary | ICD-10-CM

## 2013-11-08 DIAGNOSIS — R5381 Other malaise: Secondary | ICD-10-CM | POA: Diagnosis not present

## 2013-11-08 DIAGNOSIS — I150 Renovascular hypertension: Secondary | ICD-10-CM

## 2013-11-08 DIAGNOSIS — I151 Hypertension secondary to other renal disorders: Secondary | ICD-10-CM

## 2013-11-08 DIAGNOSIS — R972 Elevated prostate specific antigen [PSA]: Secondary | ICD-10-CM

## 2013-11-08 DIAGNOSIS — R5383 Other fatigue: Secondary | ICD-10-CM

## 2013-11-08 LAB — COMPREHENSIVE METABOLIC PANEL
ALT: 12 U/L (ref 0–53)
AST: 14 U/L (ref 0–37)
Albumin: 4.1 g/dL (ref 3.5–5.2)
Alkaline Phosphatase: 131 U/L — ABNORMAL HIGH (ref 39–117)
BUN: 16 mg/dL (ref 6–23)
CO2: 26 mEq/L (ref 19–32)
Calcium: 9.6 mg/dL (ref 8.4–10.5)
Chloride: 107 mEq/L (ref 96–112)
Creatinine, Ser: 1.7 mg/dL — ABNORMAL HIGH (ref 0.4–1.5)
GFR: 42.24 mL/min — ABNORMAL LOW (ref >60.00)
Glucose, Bld: 114 mg/dL — ABNORMAL HIGH (ref 70–99)
Potassium: 5.2 mEq/L — ABNORMAL HIGH (ref 3.5–5.1)
Sodium: 139 mEq/L (ref 135–145)
Total Bilirubin: 0.8 mg/dL (ref 0.2–1.2)
Total Protein: 7.6 g/dL (ref 6.0–8.3)

## 2013-11-08 LAB — LIPID PANEL
Cholesterol: 206 mg/dL — ABNORMAL HIGH (ref 0–200)
Cholesterol: 209 mg/dL — ABNORMAL HIGH (ref 0–200)
HDL: 25.6 mg/dL — ABNORMAL LOW (ref >39.00)
HDL: 26.8 mg/dL — ABNORMAL LOW (ref >39.00)
LDL Cholesterol: 123 mg/dL — ABNORMAL HIGH (ref 0–99)
LDL Cholesterol: 125 mg/dL — ABNORMAL HIGH (ref 0–99)
NonHDL: 180.4
NonHDL: 182.2
Total CHOL/HDL Ratio: 8
Total CHOL/HDL Ratio: 8
Triglycerides: 286 mg/dL — ABNORMAL HIGH (ref 0.0–149.0)
Triglycerides: 284 mg/dL — ABNORMAL HIGH (ref 0.0–149.0)
VLDL: 57.2 mg/dL — ABNORMAL HIGH (ref 0.0–40.0)
VLDL: 56.8 mg/dL — ABNORMAL HIGH (ref 0.0–40.0)

## 2013-11-08 LAB — HEMOGLOBIN A1C: Hgb A1c MFr Bld: 6.3 % (ref 4.6–6.5)

## 2013-11-08 LAB — MAGNESIUM: Magnesium: 2.6 mg/dL — ABNORMAL HIGH (ref 1.5–2.5)

## 2013-11-08 LAB — TSH: TSH: 3.95 u[IU]/mL (ref 0.35–4.50)

## 2013-11-08 MED ORDER — TELMISARTAN-AMLODIPINE 80-10 MG PO TABS: 1.0000 | ORAL_TABLET | Freq: Every day | ORAL | Status: DC

## 2013-11-08 NOTE — Patient Instructions (Signed)
I am changing your bp medication to a combination pill called telmisartan/amlodipine .  It is taken once daily   You can stop the amlodipine and the losartan when you start it.   If it makes you feel bad,  Stop it,  Let me know, and resume your previous medications

## 2013-11-08 NOTE — Assessment & Plan Note (Signed)
He was counselled for the third time and remains contemplative despite acknowledging the risks of recurrent cancer, lung cancer,. Strokes and CAD.

## 2013-11-08 NOTE — Assessment & Plan Note (Signed)
Stoioff is recommend biopsy under general anethesia  He is hesitant due to recurrent blacking out during prior  biopsy

## 2013-11-08 NOTE — Progress Notes (Signed)
Pre-visit discussion using our clinic review tool. No additional management support is needed unless otherwise documented below in the visit note.  

## 2013-11-09 ENCOUNTER — Telehealth: Payer: Self-pay | Admitting: Internal Medicine

## 2013-11-09 NOTE — Telephone Encounter (Signed)
Relevant patient education assigned to patient using Emmi. ° °

## 2013-11-11 NOTE — Progress Notes (Signed)
Patient ID: [name], male   DOB: 1946-04-25, 68 y.o.   MRN: [id number] Patient Active Problem List   Diagnosis Date Noted  . Hyperkalemia 11/08/2013  . Elevated alkaline phosphatase measurement 07/30/2013  . Elevated PSA, less than 10 ng/ml 07/30/2013  . Statin intolerance 04/26/2013  . Unspecified essential hypertension 10/18/2012  . Controlled diabetes mellitus type 2 with complications XX123456  . PAD (peripheral artery disease)   . Left hip pain 04/20/2012  . Kidney disease, chronic, stage III (GFR 30-59 ml/min) 04/20/2012  . Erectile dysfunction 04/20/2012  . tobacco abuse   . TIA (transient ischemic attack) 01/15/2012  . Tobacco abuse counseling 05/06/2011  . Hyperlipidemia   . Renal cell carcinoma     Subjective:  CC:   Chief Complaint  Patient presents with  . Follow-up  . Diabetes    HPI:   [name] is a 68 y.o. male who presents for 3 month follow up on recent diagnosis of DM,  Uncontrolled HTN with CKD stage III.       Has chronic Urinary frequency  Without hesitaton /  PSA 4.34  has seen Cope in the past for kidney problems.  Wants to see him if needed.   bp 150 /80 at home   varies depending on the day. He recalls feeling  very  bad (no energy woozy) when his pressure is down to 130/70.  Doesn't want to change his medicaiton   DM:  Following a low carb diet,  Physically very active for 9 months/year.  No foot numbness or burning. Does not check sugars.    Past Medical History  Diagnosis Date  . Migraine     cluster  . Obstructive sleep apnea   . Hyperlipidemia   . Hypertension   . TIA (transient ischemic attack) Aug 2013  . tobacco abuse   . Renal cell carcinoma 2004    left kidney heminephrectomy  . 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 PAD (peripheral artery disease) Feb 2009    nonobstructing, renal angiogram [name]    Past Surgical History  Procedure Laterality Date  . Heminephrectomy   2004    for renal cell CA  . Renal cryoablation  Jan 2006    right kidney,  [name]       The following portions of the patient's history were reviewed and updated as appropriate: Allergies, current medications, and problem list.    Review of Systems:   Patient denies headache, fevers, malaise, unintentional weight loss, skin rash, eye pain, sinus congestion and sinus pain, sore throat, dysphagia,  hemoptysis , cough, dyspnea, wheezing, chest pain, palpitations, orthopnea, edema, abdominal pain, nausea, melena, diarrhea, constipation, flank pain, dysuria, hematuria, urinary  Frequency, nocturia, numbness, tingling, seizures,  Focal weakness, Loss of consciousness,  Tremor, insomnia, depression, anxiety, and suicidal ideation.     History   Social History  . Marital Status: Married    Spouse Name: N/A    Number of Children: N/A  . Years of Education: N/A   Occupational History  . Not on file.   Social History Main Topics  . Smoking status: Current Every Day Smoker -- 1.50 packs/day    Types: Cigarettes  . Smokeless tobacco: Never Used  . Alcohol Use: Yes  . Drug Use: No  . Sexual Activity: Not on file   Other Topics Concern  . Not on file   Social History Narrative  . No narrative on file  Objective:  Filed Vitals:   11/08/13 0805  BP: 150/78  Pulse: 62  Temp: 98.2 F (36.8 C)  Resp: 16     General appearance: alert, cooperative and appears stated age Ears: normal TM's and external ear canals both ears Throat: lips, mucosa, and tongue normal; teeth and gums normal Neck: no adenopathy, no carotid bruit, supple, symmetrical, trachea midline and thyroid not enlarged, symmetric, no tenderness/mass/nodules Back: symmetric, no curvature. ROM normal. No CVA tenderness. Lungs: clear to auscultation bilaterally Heart: regular rate and rhythm, S1, S2 normal, no murmur, click, rub or gallop Abdomen: soft, non-tender; bowel sounds normal; no masses,  no  organomegaly Pulses: 2+ and symmetric Skin: Skin color, texture, turgor normal. No rashes or lesions Lymph nodes: Cervical, supraclavicular, and axillary nodes normal.  Assessment and Plan:  Tobacco abuse counseling He was counselled for the third time and remains contemplative despite acknowledging the risks of recurrent cancer, lung cancer,. Strokes and CAD.    Elevated PSA, less than 10 ng/ml Stoioff is recommend biopsy under general anethesia  He is hesitant due to recurrent blacking out during prior  biopsy   Controlled diabetes mellitus type 2 with complications Well-controlled on diet alone .  hemoglobin A1c has been consistently less than 7.0 . Patient is up-to-date on eye exams and foot exam was done today.  He has CKD and PAD .  Fasting lipids have been reviewed and statin therapy has been advised and updated according to new ACC guidelines based on patient's very elevated 10 year risk of CAD       Renal artery stenosis Elevated, Complicated by RA on the left, demonstrate by prior cardiac cath.  He reports he feels poorly when his bp is below 150.70.  ontinue amlodipine and changing losartan to telmisartan/hctz.      Updated Medication List Outpatient Encounter Prescriptions as of 11/08/2013  Medication Sig  . amLODipine (NORVASC) 10 MG tablet TAKE 1 TABLET BY MOUTH EVERY DAY  . aspirin 81 MG tablet Take 81 mg by mouth daily.  Marland Kitchen losartan (COZAAR) 100 MG tablet TAKE 1 TABLET BY MOUTH EVERY DAY  . ammonium lactate (LAC-HYDRIN) 12 % lotion Apply 1 application topically daily.  Marland Kitchen CIALIS 20 MG tablet TAKE 1 TABLET BY MOUTH AS NEEDED  . furosemide (LASIX) 20 MG tablet Take 1 tablet (20 mg total) by mouth daily.  . rosuvastatin (CRESTOR) 10 MG tablet One tablet Every other day at bedtime  . Telmisartan-Amlodipine 80-10 MG TABS Take 1 tablet by mouth daily.  . traMADol (ULTRAM) 50 MG tablet Take 1 tablet (50 mg total) by mouth every 8 (eight) hours as needed.  . zoster vaccine  live, PF, (ZOSTAVAX) [id number] UNT/0.65ML injection Inject 19,400 Units into the skin once.     Orders Placed This Encounter  Procedures  . Comprehensive metabolic panel  . TSH  . Lipid panel  . Magnesium  . Hemoglobin A1c  . Lipid panel  . Basic metabolic panel  . HM DIABETES EYE EXAM    Return in about 3 months (around 02/08/2014) for follow up diabetes.

## 2013-11-11 NOTE — Assessment & Plan Note (Signed)
Well-controlled on diet alone .  hemoglobin A1c has been consistently less than 7.0 . Patient is up-to-date on eye exams and foot exam was done today.  He has CKD and PAD .  Fasting lipids have been reviewed and statin therapy has been advised and updated according to new ACC guidelines based on patient's very elevated 10 year risk of CAD

## 2013-11-11 NOTE — Assessment & Plan Note (Signed)
Elevated, Complicated by RA on the left, demonstrate by prior cardiac cath.  He reports he feels poorly when his bp is below 150.70.  ontinue amlodipine and changing losartan to telmisartan/hctz.

## 2013-11-12 ENCOUNTER — Other Ambulatory Visit (INDEPENDENT_AMBULATORY_CARE_PROVIDER_SITE_OTHER): Payer: Medicare Other

## 2013-11-12 DIAGNOSIS — E875 Hyperkalemia: Secondary | ICD-10-CM | POA: Diagnosis not present

## 2013-11-12 LAB — BASIC METABOLIC PANEL
BUN: 16 mg/dL (ref 6–23)
CO2: 25 mEq/L (ref 19–32)
Calcium: 9.1 mg/dL (ref 8.4–10.5)
Chloride: 108 mEq/L (ref 96–112)
Creatinine, Ser: 1.7 mg/dL — ABNORMAL HIGH (ref 0.4–1.5)
GFR: 43.4 mL/min — ABNORMAL LOW (ref >60.00)
Glucose, Bld: 96 mg/dL (ref 70–99)
Potassium: 4.1 mEq/L (ref 3.5–5.1)
Sodium: 140 mEq/L (ref 135–145)

## 2013-11-13 ENCOUNTER — Encounter: Payer: Self-pay | Admitting: *Deleted

## 2014-01-31 DIAGNOSIS — I10 Essential (primary) hypertension: Secondary | ICD-10-CM | POA: Diagnosis not present

## 2014-01-31 DIAGNOSIS — C61 Malignant neoplasm of prostate: Secondary | ICD-10-CM | POA: Diagnosis not present

## 2014-01-31 DIAGNOSIS — Z7982 Long term (current) use of aspirin: Secondary | ICD-10-CM | POA: Diagnosis not present

## 2014-01-31 DIAGNOSIS — F17200 Nicotine dependence, unspecified, uncomplicated: Secondary | ICD-10-CM | POA: Diagnosis not present

## 2014-01-31 DIAGNOSIS — Z9889 Other specified postprocedural states: Secondary | ICD-10-CM | POA: Diagnosis not present

## 2014-01-31 DIAGNOSIS — Z85528 Personal history of other malignant neoplasm of kidney: Secondary | ICD-10-CM | POA: Diagnosis not present

## 2014-01-31 DIAGNOSIS — Z79899 Other long term (current) drug therapy: Secondary | ICD-10-CM | POA: Diagnosis not present

## 2014-01-31 DIAGNOSIS — E785 Hyperlipidemia, unspecified: Secondary | ICD-10-CM | POA: Diagnosis not present

## 2014-02-04 ENCOUNTER — Encounter: Payer: Self-pay | Admitting: Internal Medicine

## 2014-02-13 DIAGNOSIS — L578 Other skin changes due to chronic exposure to nonionizing radiation: Secondary | ICD-10-CM | POA: Diagnosis not present

## 2014-02-13 DIAGNOSIS — L820 Inflamed seborrheic keratosis: Secondary | ICD-10-CM | POA: Diagnosis not present

## 2014-02-13 DIAGNOSIS — B079 Viral wart, unspecified: Secondary | ICD-10-CM | POA: Diagnosis not present

## 2014-02-13 DIAGNOSIS — L708 Other acne: Secondary | ICD-10-CM | POA: Diagnosis not present

## 2014-02-13 DIAGNOSIS — D239 Other benign neoplasm of skin, unspecified: Secondary | ICD-10-CM | POA: Diagnosis not present

## 2014-02-13 DIAGNOSIS — Z1283 Encounter for screening for malignant neoplasm of skin: Secondary | ICD-10-CM | POA: Diagnosis not present

## 2014-02-13 DIAGNOSIS — L738 Other specified follicular disorders: Secondary | ICD-10-CM | POA: Diagnosis not present

## 2014-02-13 DIAGNOSIS — Z85828 Personal history of other malignant neoplasm of skin: Secondary | ICD-10-CM | POA: Diagnosis not present

## 2014-02-15 ENCOUNTER — Ambulatory Visit (INDEPENDENT_AMBULATORY_CARE_PROVIDER_SITE_OTHER): Payer: Medicare Other | Admitting: Internal Medicine

## 2014-02-15 ENCOUNTER — Encounter: Payer: Self-pay | Admitting: Internal Medicine

## 2014-02-15 VITALS — BP 168/82 | HR 77 | Temp 98.4°F | Resp 14 | Ht 67.0 in | Wt 166.8 lb

## 2014-02-15 DIAGNOSIS — I701 Atherosclerosis of renal artery: Secondary | ICD-10-CM | POA: Diagnosis not present

## 2014-02-15 DIAGNOSIS — E118 Type 2 diabetes mellitus with unspecified complications: Secondary | ICD-10-CM

## 2014-02-15 DIAGNOSIS — E1129 Type 2 diabetes mellitus with other diabetic kidney complication: Secondary | ICD-10-CM

## 2014-02-15 DIAGNOSIS — I10 Essential (primary) hypertension: Secondary | ICD-10-CM

## 2014-02-15 DIAGNOSIS — I739 Peripheral vascular disease, unspecified: Secondary | ICD-10-CM | POA: Diagnosis not present

## 2014-02-15 DIAGNOSIS — N183 Chronic kidney disease, stage 3 unspecified: Secondary | ICD-10-CM

## 2014-02-15 DIAGNOSIS — Z23 Encounter for immunization: Secondary | ICD-10-CM | POA: Diagnosis not present

## 2014-02-15 LAB — COMPREHENSIVE METABOLIC PANEL
ALT: 15 U/L (ref 0–53)
AST: 16 U/L (ref 0–37)
Albumin: 3.9 g/dL (ref 3.5–5.2)
Alkaline Phosphatase: 139 U/L — ABNORMAL HIGH (ref 39–117)
BUN: 17 mg/dL (ref 6–23)
CO2: 24 mEq/L (ref 19–32)
Calcium: 9.3 mg/dL (ref 8.4–10.5)
Chloride: 105 mEq/L (ref 96–112)
Creatinine, Ser: 1.8 mg/dL — ABNORMAL HIGH (ref 0.4–1.5)
GFR: 39.04 mL/min — ABNORMAL LOW (ref >60.00)
Glucose, Bld: 120 mg/dL — ABNORMAL HIGH (ref 70–99)
Potassium: 3.9 mEq/L (ref 3.5–5.1)
Sodium: 136 mEq/L (ref 135–145)
Total Bilirubin: 0.9 mg/dL (ref 0.2–1.2)
Total Protein: 7.5 g/dL (ref 6.0–8.3)

## 2014-02-15 LAB — LIPID PANEL
Cholesterol: 216 mg/dL — ABNORMAL HIGH (ref 0–200)
HDL: 25.7 mg/dL — ABNORMAL LOW (ref >39.00)
NonHDL: 190.3
Total CHOL/HDL Ratio: 8
Triglycerides: 288 mg/dL — ABNORMAL HIGH (ref 0.0–149.0)
VLDL: 57.6 mg/dL — ABNORMAL HIGH (ref 0.0–40.0)

## 2014-02-15 LAB — HEMOGLOBIN A1C: Hgb A1c MFr Bld: 6.1 % (ref 4.6–6.5)

## 2014-02-15 LAB — LDL CHOLESTEROL, DIRECT: Direct LDL: 126.8 mg/dL

## 2014-02-15 MED ORDER — TELMISARTAN-AMLODIPINE 80-10 MG PO TABS: 1.0000 | ORAL_TABLET | Freq: Every day | ORAL | Status: DC

## 2014-02-15 NOTE — Progress Notes (Signed)
Pre-visit discussion using our clinic review tool. No additional management support is needed unless otherwise documented below in the visit note.  

## 2014-02-15 NOTE — Patient Instructions (Addendum)
Don't skip breakfast!  It's the most important meal for diabetics.  It will help regulate your sugars and your insulin secretion   Instant oatmeal has WAY TOO MUCH sugar in it ,  Try the Atkins or the EAS "carb control" protein shakes instead  ([facility] carries both in the aisle in front of the pharmacy in the diet food aisle )   Try the cafe caramel flavor and the chocolate flavors.  Just refrigerate and shake!  Try switching from  Pepsi to diet  Dr Malachi Bonds  To cut your sugar intake

## 2014-02-15 NOTE — Progress Notes (Signed)
Patient ID: [name], male   DOB: 1946/03/24, 68 y.o.   MRN: [id number]   Patient Active Problem List   Diagnosis Date Noted  . Hyperkalemia 11/08/2013  . Elevated alkaline phosphatase measurement 07/30/2013  . Prostate ca 07/30/2013  . Statin intolerance 04/26/2013  . Hypertensive renal sclerosis with hypertension 10/18/2012  . Controlled diabetes mellitus type 2 with complications XX123456  . PAD (peripheral artery disease)   . Left hip pain 04/20/2012  . Kidney disease, chronic, stage III (GFR 30-59 ml/min) 04/20/2012  . Erectile dysfunction 04/20/2012  . tobacco abuse   . TIA (transient ischemic attack) 01/15/2012  . Tobacco abuse counseling 05/06/2011  . Hyperlipidemia   . Renal cell carcinoma     Subjective:  CC:   Chief Complaint  Patient presents with  . Follow-up  . Diabetes  . Hypertension    HPI:   [name] is a 68 y.o. male who presents for Follow up on diet controlled DM and uncontrolled hypertension.   Diet reviewed.  He either skips breakfast or eats instant flavored oatmeal or a cinnamon "sticky bun"  Does note that occasionally feels shaky if he skips a meal, but has never checked his blood sugars.   Has not changed BP  Medications yet.  Using losartan and amlodipine until today    Past Medical History  Diagnosis Date  . Migraine     cluster  . Obstructive sleep apnea   . Hyperlipidemia   . Hypertension   . TIA (transient ischemic attack) Aug 2013  . tobacco abuse   . Renal cell carcinoma 2004    left kidney heminephrectomy  . 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 PAD (peripheral artery disease) Feb 2009    nonobstructing, renal angiogram [name]    Past Surgical History  Procedure Laterality Date  . Heminephrectomy  2004    for renal cell CA  . Renal cryoablation  Jan 2006    right kidney,  [name]       The following portions of the patient's history were reviewed and updated as  appropriate: Allergies, current medications, and problem list.    Review of Systems:   Patient denies headache, fevers, malaise, unintentional weight loss, skin rash, eye pain, sinus congestion and sinus pain, sore throat, dysphagia,  hemoptysis , cough, dyspnea, wheezing, chest pain, palpitations, orthopnea, edema, abdominal pain, nausea, melena, diarrhea, constipation, flank pain, dysuria, hematuria, urinary  Frequency, nocturia, numbness, tingling, seizures,  Focal weakness, Loss of consciousness,  Tremor, insomnia, depression, anxiety, and suicidal ideation.     History   Social History  . Marital Status: Married    Spouse Name: N/A    Number of Children: N/A  . Years of Education: N/A   Occupational History  . Not on file.   Social History Main Topics  . Smoking status: Current Every Day Smoker -- 1.50 packs/day    Types: Cigarettes  . Smokeless tobacco: Never Used  . Alcohol Use: Yes  . Drug Use: No  . Sexual Activity: Not on file   Other Topics Concern  . Not on file   Social History Narrative  . No narrative on file    Objective:  Filed Vitals:   02/15/14 0903  BP: 168/82  Pulse: 77  Temp: 98.4 F (36.9 C)  Resp: 14     General appearance: alert, cooperative and appears stated age Ears: normal TM's and external ear canals both ears Throat: lips,  mucosa, and tongue normal; teeth and gums normal Neck: no adenopathy, no carotid bruit, supple, symmetrical, trachea midline and thyroid not enlarged, symmetric, no tenderness/mass/nodules Back: symmetric, no curvature. ROM normal. No CVA tenderness. Lungs: clear to auscultation bilaterally Heart: regular rate and rhythm, S1, S2 normal, no murmur, click, rub or gallop Abdomen: soft, non-tender; bowel sounds normal; no masses,  no organomegaly Pulses: 2+ and symmetric Skin: Skin color, texture, turgor normal. No rashes or lesions Lymph nodes: Cervical, supraclavicular, and axillary nodes normal.  Assessment  and Plan:  Hypertensive renal sclerosis with hypertension Still uncontrolled  .  He is changing to telmisartan/amlodipine and will need renal artery dopplers repeated if not done by [facility] and the last year.  He feels poorly when BP dips below 150/90 which may be due to his diffuse PAD.   Controlled diabetes mellitus type 2 with complications Well-controlled on diet alone .  hemoglobin A1c has been consistently less than 7.0 . Patient is up-to-date on eye exams and foot exam was done today.  He has CKD and PAD .  Fasting lipids have been reviewed and statin therapy has been advised and updated according to new ACC guidelines based on patient's very elevated 10 year risk of CAD .  He has been instructed on glucometer use and advised to check sugars to rule out hypoglycemic events.  Lab Results  Component Value Date   HGBA1C 6.1 02/15/2014   Lab Results  Component Value Date   MICROALBUR 30.6* 07/30/2013   Lab Results  Component Value Date   CHOL 216* 02/15/2014   HDL 25.70* 02/15/2014   LDLCALC 123* 11/08/2013   LDLCALC 125* 11/08/2013   LDLDIRECT 126.8 02/15/2014   TRIG 288.0* 02/15/2014   CHOLHDL 8 02/15/2014           Kidney disease, chronic, stage III (GFR 30-59 ml/min) GFR is stable, but he continues to have uncontrolled hypertension , Complicated by RA on the left, demonstrated by prior cardiac cath.  He reports he feels poorly when his bp is below 150.70.  ontinue amlodipine and changing losartan to telmisartan/hctz.       PAD (peripheral artery disease) [facility] has been monitoring LE vascular disease and carotid artery, but it is unclear if his RAS has been reevaluated recetnly.  In light if his resistant hypertension, will ask [facility] to repeat renal artery dopplers.    Renal artery stenosis Elevated, Complicated by RA on the left, demonstrate by prior cardiac cath.         Updated Medication List Outpatient Encounter Prescriptions as of 02/15/2014  Medication Sig  .  amLODipine (NORVASC) 10 MG tablet TAKE 1 TABLET BY MOUTH EVERY DAY  . ammonium lactate (LAC-HYDRIN) 12 % lotion Apply 1 application topically daily.  Marland Kitchen aspirin 81 MG tablet Take 81 mg by mouth daily.  . rosuvastatin (CRESTOR) 10 MG tablet One tablet Every other day at bedtime  . Telmisartan-Amlodipine 80-10 MG TABS Take 1 tablet by mouth daily.  . traMADol (ULTRAM) 50 MG tablet Take 1 tablet (50 mg total) by mouth every 8 (eight) hours as needed.  . [DISCONTINUED] losartan (COZAAR) 100 MG tablet TAKE 1 TABLET BY MOUTH EVERY DAY  . [DISCONTINUED] Telmisartan-Amlodipine 80-10 MG TABS Take 1 tablet by mouth daily.  Marland Kitchen CIALIS 20 MG tablet TAKE 1 TABLET BY MOUTH AS NEEDED  . furosemide (LASIX) 20 MG tablet Take 1 tablet (20 mg total) by mouth daily.  Marland Kitchen zoster vaccine live, PF, (ZOSTAVAX) [id number] UNT/0.65ML injection Inject 19,400 Units  into the skin once.     Orders Placed This Encounter  Procedures  . US Renal Artery Stenosis  . Pneumococcal conjugate vaccine 13-valent  . Comprehensive metabolic panel  . Hemoglobin A1c  . Lipid panel  . LDL cholesterol, direct    Return in about 3 months (around 05/17/2014) for follow up diabetes.

## 2014-02-15 NOTE — Assessment & Plan Note (Addendum)
Still uncontrolled  .  He is changing to telmisartan/amlodipine and will need renal artery dopplers repeated if not done by [facility] and the last year.  He feels poorly when BP dips below 150/90 which may be due to his diffuse PAD.

## 2014-02-17 ENCOUNTER — Encounter: Payer: Self-pay | Admitting: Internal Medicine

## 2014-02-17 NOTE — Assessment & Plan Note (Addendum)
[facility] has been monitoring LE vascular disease and carotid artery, but it is unclear if his RAS has been reevaluated recetnly.  In light if his resistant hypertension, will ask [facility] to repeat renal artery dopplers.

## 2014-02-17 NOTE — Assessment & Plan Note (Signed)
Elevated, Complicated by RA on the left, demonstrate by prior cardiac cath.

## 2014-02-17 NOTE — Assessment & Plan Note (Signed)
GFR is stable, but he continues to have uncontrolled hypertension , Complicated by RA on the left, demonstrated by prior cardiac cath.  He reports he feels poorly when his bp is below 150.70.  ontinue amlodipine and changing losartan to telmisartan/hctz.

## 2014-02-17 NOTE — Assessment & Plan Note (Signed)
Well-controlled on diet alone .  hemoglobin A1c has been consistently less than 7.0 . Patient is up-to-date on eye exams and foot exam was done today.  He has CKD and PAD .  Fasting lipids have been reviewed and statin therapy has been advised and updated according to new ACC guidelines based on patient's very elevated 10 year risk of CAD .  He has been instructed on glucometer use and advised to check sugars to rule out hypoglycemic events.  Lab Results  Component Value Date   HGBA1C 6.1 02/15/2014   Lab Results  Component Value Date   MICROALBUR 30.6* 07/30/2013   Lab Results  Component Value Date   CHOL 216* 02/15/2014   HDL 25.70* 02/15/2014   LDLCALC 123* 11/08/2013   LDLCALC 125* 11/08/2013   LDLDIRECT 126.8 02/15/2014   TRIG 288.0* 02/15/2014   CHOLHDL 8 02/15/2014

## 2014-02-18 ENCOUNTER — Telehealth: Payer: Self-pay | Admitting: *Deleted

## 2014-02-18 ENCOUNTER — Encounter: Payer: Self-pay | Admitting: *Deleted

## 2014-02-18 NOTE — Telephone Encounter (Signed)
Fax from [place], [place] approved through 05/23/14. Pharmacy notified.

## 2014-02-18 NOTE — Telephone Encounter (Signed)
Fax from pharmacy, needing PA on Telmisartan-Amlodipine. Started online, pending approval

## 2014-02-26 DIAGNOSIS — I779 Disorder of arteries and arterioles, unspecified: Secondary | ICD-10-CM | POA: Insufficient documentation

## 2014-02-26 DIAGNOSIS — C649 Malignant neoplasm of unspecified kidney, except renal pelvis: Secondary | ICD-10-CM | POA: Insufficient documentation

## 2014-02-26 DIAGNOSIS — D291 Benign neoplasm of prostate: Secondary | ICD-10-CM | POA: Diagnosis not present

## 2014-02-26 DIAGNOSIS — C61 Malignant neoplasm of prostate: Secondary | ICD-10-CM | POA: Diagnosis not present

## 2014-05-16 ENCOUNTER — Encounter: Payer: Self-pay | Admitting: Internal Medicine

## 2014-05-16 ENCOUNTER — Ambulatory Visit (INDEPENDENT_AMBULATORY_CARE_PROVIDER_SITE_OTHER): Payer: Medicare Other | Admitting: Internal Medicine

## 2014-05-16 VITALS — BP 158/84 | HR 87 | Temp 98.3°F | Resp 14 | Ht 67.0 in | Wt 172.2 lb

## 2014-05-16 DIAGNOSIS — I701 Atherosclerosis of renal artery: Secondary | ICD-10-CM

## 2014-05-16 DIAGNOSIS — C649 Malignant neoplasm of unspecified kidney, except renal pelvis: Secondary | ICD-10-CM

## 2014-05-16 DIAGNOSIS — E118 Type 2 diabetes mellitus with unspecified complications: Secondary | ICD-10-CM

## 2014-05-16 DIAGNOSIS — I129 Hypertensive chronic kidney disease with stage 1 through stage 4 chronic kidney disease, or unspecified chronic kidney disease: Secondary | ICD-10-CM

## 2014-05-16 DIAGNOSIS — F1910 Other psychoactive substance abuse, uncomplicated: Secondary | ICD-10-CM

## 2014-05-16 DIAGNOSIS — C61 Malignant neoplasm of prostate: Secondary | ICD-10-CM

## 2014-05-16 MED ORDER — IRBESARTAN 300 MG PO TABS: 300.0000 mg | ORAL_TABLET | Freq: Every day | ORAL | Status: DC

## 2014-05-16 MED ORDER — ASPIRIN 81 MG PO TABS: 81.0000 mg | ORAL_TABLET | Freq: Every day | ORAL | Status: DC

## 2014-05-16 MED ORDER — AMLODIPINE BESYLATE 10 MG PO TABS: 10.0000 mg | ORAL_TABLET | Freq: Every day | ORAL | Status: DC

## 2014-05-16 NOTE — Patient Instructions (Addendum)
Return in January for fasting labs   I am changing your  blood pressure medication to individual medicines again: irbesartan and amlodipine

## 2014-05-16 NOTE — Progress Notes (Signed)
Patient ID: [name], male   DOB: Sep 18, 1945, 68 y.o.   MRN: [id number]   Patient Active Problem List   Diagnosis Date Noted  . Adenocarcinoma, renal cell 02/26/2014  . Elevated alkaline phosphatase measurement 07/30/2013  . Prostate ca 07/30/2013  . CA of prostate 07/30/2013  . Statin intolerance 04/26/2013  . Hypertensive renal sclerosis with hypertension 10/18/2012  . Controlled diabetes mellitus type 2 with complications XX123456  . PAD (peripheral artery disease)   . Left hip pain 04/20/2012  . Kidney disease, chronic, stage III (GFR 30-59 ml/min) 04/20/2012  . Erectile dysfunction 04/20/2012  . Chronic kidney disease (CKD), stage III (moderate) 04/20/2012  . tobacco abuse   . TIA (transient ischemic attack) 01/15/2012  . Tobacco abuse counseling 05/06/2011  . Hyperlipidemia   . Renal cell carcinoma     Subjective:  CC:   Chief Complaint  Patient presents with  . Follow-up    diabetes    HPI:   [name] is a 68 y.o. male who presents for  Follow up on DM type 2,well controlled,,  Hyperlipidemia, tobacco abuse  and hypertension.  He has been seeing Dr Laveda Abbe for chiropractic management of sciatica. His pain is episodic and aggravated by physicially demandig tasks,  He denies any falls, numbness or tingling of extremities.  He feels generally well, checking blood sugars rarely , only if he feels bad.    BS have been under 130 fasting and < 150 post prandially.  Denies any recent hypoglyemic events.  Taking his medications as directed. Following a carbohydrate modified diet 6 days per week. Denies numbness, burning and tingling of extremities. Appetite is good.      Past Medical History  Diagnosis Date  . Migraine     cluster  . Obstructive sleep apnea   . Hyperlipidemia   . Hypertension   . TIA (transient ischemic attack) Aug 2013  . tobacco abuse   . Renal cell carcinoma 2004    left kidney heminephrectomy  . 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 PAD (peripheral artery disease) Feb 2009    nonobstructing, renal angiogram [name]    Past Surgical History  Procedure Laterality Date  . Heminephrectomy  2004    for renal cell CA  . Renal cryoablation  Jan 2006    right kidney,  [name]       The following portions of the patient's history were reviewed and updated as appropriate: Allergies, current medications, and problem list.    Review of Systems:   Patient denies headache, fevers, malaise, unintentional weight loss, skin rash, eye pain, sinus congestion and sinus pain, sore throat, dysphagia,  hemoptysis , cough, dyspnea, wheezing, chest pain, palpitations, orthopnea, edema, abdominal pain, nausea, melena, diarrhea, constipation, flank pain, dysuria, hematuria, urinary  Frequency, nocturia, numbness, tingling, seizures,  Focal weakness, Loss of consciousness,  Tremor, insomnia, depression, anxiety, and suicidal ideation.     History   Social History  . Marital Status: Married    Spouse Name: N/A    Number of Children: N/A  . Years of Education: N/A   Occupational History  . Not on file.   Social History Main Topics  . Smoking status: Current Every Day Smoker -- 1.50 packs/day    Types: Cigarettes  . Smokeless tobacco: Never Used  . Alcohol Use: Yes  . Drug Use: No  . Sexual Activity: Not on file   Other Topics Concern  . Not on  file   Social History Narrative    Objective:  Filed Vitals:   05/16/14 0938  BP: 158/84  Pulse: 87  Temp: 98.3 F (36.8 C)  Resp: 14     General appearance: alert, cooperative and appears stated age Ears: normal TM's and external ear canals both ears Throat: lips, mucosa, and tongue normal; teeth and gums normal Neck: no adenopathy, no carotid bruit, supple, symmetrical, trachea midline and thyroid not enlarged, symmetric, no tenderness/mass/nodules Back: symmetric, no curvature. ROM normal. No CVA tenderness. Lungs: clear to  auscultation bilaterally Heart: regular rate and rhythm, S1, S2 normal, no murmur, click, rub or gallop Abdomen: soft, non-tender; bowel sounds normal; no masses,  no organomegaly Pulses: 2+ and symmetric Skin: Skin color, texture, turgor normal. No rashes or lesions Lymph nodes: Cervical, supraclavicular, and axillary nodes normal.  Assessment and Plan:  Hypertensive renal sclerosis with hypertension His blood pressure remains elevated, Complicated by RA on the left, demonstrated by prior cardiac cath.   He has general malaise when BP drops below 150/90, suggesting diffuse PAD. Changing medicaitons for cost savings to amlodipine and irbesartan.  Lab Results  Component Value Date   CREATININE 1.8* 02/15/2014  . Lab Results  Component Value Date   NA 136 02/15/2014   K 3.9 02/15/2014   CL 105 02/15/2014   CO2 24 02/15/2014        Prostate ca Asymptomatic, low risk with repeat biopsy in October done by [facility] with benign samples.   Controlled diabetes mellitus type 2 with complications Well-controlled on diet alone .  hemoglobin A1c has been consistently less than 7.0 . Patient is up-to-date on eye exams and foot exam was done today.  He has CKD and PAD .  Fasting lipids have been reviewed and statin therapy has been advised and updated according to new ACC guidelines based on patient's very elevated 10 year risk of CAD .  He has been instructed on glucometer use and advised to check sugars to rule out hypoglycemic events.  Lab Results  Component Value Date   HGBA1C 6.1 02/15/2014   Lab Results  Component Value Date   MICROALBUR 30.6* 07/30/2013   Lab Results  Component Value Date   CHOL 216* 02/15/2014   HDL 25.70* 02/15/2014   LDLCALC 123* 11/08/2013   LDLCALC 125* 11/08/2013   LDLDIRECT 126.8 02/15/2014   TRIG 288.0* 02/15/2014   CHOLHDL 8 02/15/2014             tobacco abuse The patient was counseled on the dangers of tobacco use, and was advised to  quit.  Reviewed strategies to maximize success, including substitution of other forms of reinforcement and support of family/friends.  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 Additional history per [facility] indicate cryoablation of a right renal mass in 2006 which was presumed to be adenocarcinoma. Both procedures were done by Dr. [name]. Now has annual surveillance managed by Dr Bernardo Heater     Updated Medication List Outpatient Encounter Prescriptions as of 05/16/2014  Medication Sig  . aspirin 81 MG tablet Take 1 tablet (81 mg total) by mouth daily.  . [DISCONTINUED] amLODipine (NORVASC) 10 MG tablet TAKE 1 TABLET BY MOUTH EVERY DAY  . [DISCONTINUED] ammonium lactate (LAC-HYDRIN) 12 % lotion Apply 1 application topically daily.  . [DISCONTINUED] aspirin 81 MG tablet Take 81 mg by mouth daily.  . [DISCONTINUED] CIALIS 20 MG tablet TAKE 1 TABLET  BY MOUTH AS NEEDED  . [DISCONTINUED] furosemide (LASIX) 20 MG tablet Take 1 tablet (20 mg total) by mouth daily.  . [DISCONTINUED] Telmisartan-Amlodipine 80-10 MG TABS Take 1 tablet by mouth daily.  . [DISCONTINUED] traMADol (ULTRAM) 50 MG tablet Take 1 tablet (50 mg total) by mouth every 8 (eight) hours as needed.  Marland Kitchen amLODipine (NORVASC) 10 MG tablet Take 1 tablet (10 mg total) by mouth daily.  . irbesartan (AVAPRO) 300 MG tablet Take 1 tablet (300 mg total) by mouth daily.  Marland Kitchen zoster vaccine live, PF, (ZOSTAVAX) [id number] UNT/0.65ML injection Inject 19,400 Units into the skin once. (Patient not taking: Reported on 05/16/2014)  . [DISCONTINUED] rosuvastatin (CRESTOR) 10 MG tablet One tablet Every other day at bedtime (Patient not taking: Reported on 05/16/2014)     No orders of the defined types were placed in this encounter.    No Follow-up on file.

## 2014-05-18 ENCOUNTER — Encounter: Payer: Self-pay | Admitting: Internal Medicine

## 2014-05-18 NOTE — Assessment & Plan Note (Signed)
Asymptomatic, low risk with repeat biopsy in October done by [facility] with benign samples.

## 2014-05-18 NOTE — Assessment & Plan Note (Signed)
Well-controlled on diet alone .  hemoglobin A1c has been consistently less than 7.0 . Patient is up-to-date on eye exams and foot exam was done today.  He has CKD and PAD .  Fasting lipids have been reviewed and statin therapy has been advised and updated according to new ACC guidelines based on patient's very elevated 10 year risk of CAD .  He has been instructed on glucometer use and advised to check sugars to rule out hypoglycemic events.  Lab Results  Component Value Date   HGBA1C 6.1 02/15/2014   Lab Results  Component Value Date   MICROALBUR 30.6* 07/30/2013   Lab Results  Component Value Date   CHOL 216* 02/15/2014   HDL 25.70* 02/15/2014   LDLCALC 123* 11/08/2013   LDLCALC 125* 11/08/2013   LDLDIRECT 126.8 02/15/2014   TRIG 288.0* 02/15/2014   CHOLHDL 8 02/15/2014

## 2014-05-18 NOTE — Assessment & Plan Note (Signed)
The patient was counseled on the dangers of tobacco use, and was advised to quit.  Reviewed strategies to maximize success, including substitution of other forms of reinforcement and support of family/friends.

## 2014-05-18 NOTE — Assessment & Plan Note (Addendum)
His blood pressure remains elevated, Complicated by RA on the left, demonstrated by prior cardiac cath.   He has general malaise when BP drops below 150/90, suggesting diffuse PAD. Changing medicaitons for cost savings to amlodipine and irbesartan.  Lab Results  Component Value Date   CREATININE 1.8* 02/15/2014  . Lab Results  Component Value Date   NA 136 02/15/2014   K 3.9 02/15/2014   CL 105 02/15/2014   CO2 24 02/15/2014

## 2014-05-18 NOTE — Assessment & Plan Note (Signed)
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 Additional history per review of [facility] indicate cryoablation of a right renal mass in 2006 which was presumed to be adenocarcinoma. Both procedures were done by Dr. [name]. Now has annual surveillance managed by Dr Bernardo Heater

## 2014-05-20 ENCOUNTER — Ambulatory Visit: Payer: Medicare Other | Admitting: Internal Medicine

## 2014-05-28 ENCOUNTER — Other Ambulatory Visit (INDEPENDENT_AMBULATORY_CARE_PROVIDER_SITE_OTHER): Payer: Medicare Other

## 2014-05-28 DIAGNOSIS — E118 Type 2 diabetes mellitus with unspecified complications: Secondary | ICD-10-CM | POA: Diagnosis not present

## 2014-05-28 DIAGNOSIS — I129 Hypertensive chronic kidney disease with stage 1 through stage 4 chronic kidney disease, or unspecified chronic kidney disease: Secondary | ICD-10-CM

## 2014-05-28 DIAGNOSIS — E785 Hyperlipidemia, unspecified: Secondary | ICD-10-CM | POA: Diagnosis not present

## 2014-05-28 DIAGNOSIS — C649 Malignant neoplasm of unspecified kidney, except renal pelvis: Secondary | ICD-10-CM | POA: Diagnosis not present

## 2014-05-28 DIAGNOSIS — N183 Chronic kidney disease, stage 3 (moderate): Secondary | ICD-10-CM

## 2014-05-28 LAB — LIPID PANEL
Cholesterol: 218 mg/dL — ABNORMAL HIGH (ref 0–200)
HDL: 24.9 mg/dL — ABNORMAL LOW (ref >39.00)
NonHDL: 193.1
Total CHOL/HDL Ratio: 9
Triglycerides: 250 mg/dL — ABNORMAL HIGH (ref 0.0–149.0)
VLDL: 50 mg/dL — ABNORMAL HIGH (ref 0.0–40.0)

## 2014-05-28 LAB — CBC WITH DIFFERENTIAL/PLATELET
Basophils Absolute: 0 10*3/uL (ref 0.0–0.1)
Basophils Relative: 0.4 % (ref 0.0–3.0)
Eosinophils Absolute: 0.5 10*3/uL (ref 0.0–0.7)
Eosinophils Relative: 4.3 % (ref 0.0–5.0)
HCT: 44.1 % (ref 39.0–52.0)
Hemoglobin: 14.9 g/dL (ref 13.0–17.0)
Lymphocytes Relative: 23.6 % (ref 12.0–46.0)
Lymphs Abs: 2.5 10*3/uL (ref 0.7–4.0)
MCHC: 33.9 g/dL (ref 30.0–36.0)
MCV: 86.4 fl (ref 78.0–100.0)
Monocytes Absolute: 0.5 10*3/uL (ref 0.1–1.0)
Monocytes Relative: 4.3 % (ref 3.0–12.0)
Neutro Abs: 7.2 10*3/uL (ref 1.4–7.7)
Neutrophils Relative %: 67.4 % (ref 43.0–77.0)
Platelets: 264 10*3/uL (ref 150.0–400.0)
RBC: 5.1 Mil/uL (ref 4.22–5.81)
RDW: 12.9 % (ref 11.5–15.5)
WBC: 10.8 10*3/uL — ABNORMAL HIGH (ref 4.0–10.5)

## 2014-05-28 LAB — COMPREHENSIVE METABOLIC PANEL
ALT: 12 U/L (ref 0–53)
AST: 13 U/L (ref 0–37)
Albumin: 3.7 g/dL (ref 3.5–5.2)
Alkaline Phosphatase: 142 U/L — ABNORMAL HIGH (ref 39–117)
BUN: 24 mg/dL — ABNORMAL HIGH (ref 6–23)
CO2: 26 mEq/L (ref 19–32)
Calcium: 9.3 mg/dL (ref 8.4–10.5)
Chloride: 108 mEq/L (ref 96–112)
Creatinine, Ser: 1.8 mg/dL — ABNORMAL HIGH (ref 0.4–1.5)
GFR: 40.53 mL/min — ABNORMAL LOW (ref >60.00)
Glucose, Bld: 132 mg/dL — ABNORMAL HIGH (ref 70–99)
Potassium: 4.6 mEq/L (ref 3.5–5.1)
Sodium: 139 mEq/L (ref 135–145)
Total Bilirubin: 1 mg/dL (ref 0.2–1.2)
Total Protein: 7.4 g/dL (ref 6.0–8.3)

## 2014-05-28 LAB — LDL CHOLESTEROL, DIRECT: Direct LDL: 135.6 mg/dL

## 2014-05-28 LAB — HEMOGLOBIN A1C: Hgb A1c MFr Bld: 6.6 % — ABNORMAL HIGH (ref 4.6–6.5)

## 2014-05-28 LAB — MICROALBUMIN / CREATININE URINE RATIO
Creatinine,U: 53.1 mg/dL
Microalb Creat Ratio: 114.6 mg/g — ABNORMAL HIGH (ref 0.0–30.0)
Microalb, Ur: 60.9 mg/dL — ABNORMAL HIGH (ref 0.0–1.9)

## 2014-05-29 ENCOUNTER — Encounter: Payer: Self-pay | Admitting: *Deleted

## 2014-08-22 ENCOUNTER — Ambulatory Visit (INDEPENDENT_AMBULATORY_CARE_PROVIDER_SITE_OTHER): Payer: Medicare Other | Admitting: Internal Medicine

## 2014-08-22 ENCOUNTER — Encounter: Payer: Self-pay | Admitting: Internal Medicine

## 2014-08-22 VITALS — BP 158/84 | HR 81 | Temp 97.9°F | Resp 16 | Ht 67.0 in | Wt 171.8 lb

## 2014-08-22 DIAGNOSIS — I129 Hypertensive chronic kidney disease with stage 1 through stage 4 chronic kidney disease, or unspecified chronic kidney disease: Secondary | ICD-10-CM

## 2014-08-22 DIAGNOSIS — E785 Hyperlipidemia, unspecified: Secondary | ICD-10-CM

## 2014-08-22 DIAGNOSIS — H9110 Presbycusis, unspecified ear: Secondary | ICD-10-CM | POA: Insufficient documentation

## 2014-08-22 DIAGNOSIS — N183 Chronic kidney disease, stage 3 unspecified: Secondary | ICD-10-CM

## 2014-08-22 DIAGNOSIS — Z9889 Other specified postprocedural states: Secondary | ICD-10-CM | POA: Diagnosis not present

## 2014-08-22 DIAGNOSIS — E118 Type 2 diabetes mellitus with unspecified complications: Secondary | ICD-10-CM

## 2014-08-22 LAB — COMPREHENSIVE METABOLIC PANEL
ALT: 13 U/L (ref 0–53)
AST: 12 U/L (ref 0–37)
Albumin: 3.8 g/dL (ref 3.5–5.2)
Alkaline Phosphatase: 158 U/L — ABNORMAL HIGH (ref 39–117)
BUN: 16 mg/dL (ref 6–23)
CO2: 27 mEq/L (ref 19–32)
Calcium: 9.7 mg/dL (ref 8.4–10.5)
Chloride: 105 mEq/L (ref 96–112)
Creatinine, Ser: 1.7 mg/dL — ABNORMAL HIGH (ref 0.40–1.50)
GFR: 42.71 mL/min — ABNORMAL LOW (ref >60.00)
Glucose, Bld: 140 mg/dL — ABNORMAL HIGH (ref 70–99)
Potassium: 4.5 mEq/L (ref 3.5–5.1)
Sodium: 137 mEq/L (ref 135–145)
Total Bilirubin: 0.8 mg/dL (ref 0.2–1.2)
Total Protein: 7.4 g/dL (ref 6.0–8.3)

## 2014-08-22 LAB — HM DIABETES FOOT EXAM: HM Diabetic Foot Exam: NORMAL

## 2014-08-22 LAB — LDL CHOLESTEROL, DIRECT: Direct LDL: 108 mg/dL

## 2014-08-22 NOTE — Progress Notes (Signed)
Patient ID: [name], male   DOB: 02-Mar-1946, 69 y.o.   MRN: [id number]  Patient Active Problem List   Diagnosis Date Noted  . S/P UVPP (uvulopalatopharyngoplasty) 08/22/2014  . Presbyacusis 08/22/2014  . Adenocarcinoma, renal cell 02/26/2014  . Elevated alkaline phosphatase measurement 07/30/2013  . Prostate ca 07/30/2013  . CA of prostate 07/30/2013  . Statin intolerance 04/26/2013  . Hypertensive renal sclerosis with hypertension 10/18/2012  . Controlled diabetes mellitus type 2 with complications 50/56/9794  . PAD (peripheral artery disease)   . Left hip pain 04/20/2012  . Kidney disease, chronic, stage III (GFR 30-59 ml/min) 04/20/2012  . Erectile dysfunction 04/20/2012  . Chronic kidney disease (CKD), stage III (moderate) 04/20/2012  . tobacco abuse   . TIA (transient ischemic attack) 01/15/2012  . Tobacco abuse counseling 05/06/2011  . Hyperlipidemia   . Renal cell carcinoma     Subjective:  CC:   Chief Complaint  Patient presents with  . Follow-up  . Hypertension  . Diabetes    HPI:   [name] is a 69 y.o. male who presents for   Follow up on diet controlled DM with renal disease CKD stage 3,   Atherosclerosis and hypertension.   He feels generally well, is not exercising regularly weekly and does not chec blood sugars.  Denies any recent hypoglyemic events.  Taking his medications as directed. Following a carbohydrate modified diet 6 days per week. Denies numbness, burning and tingling of extremities. Appetite is good.  Medication changes last time were tolerated,  Feels bad  If his BP falls below 150/70   Past Medical History  Diagnosis Date  . Migraine     cluster  . Obstructive sleep apnea   . Hyperlipidemia   . Hypertension   . TIA (transient ischemic attack) Aug 2013  . tobacco abuse   . Renal cell carcinoma 2004    left kidney heminephrectomy  . 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 PAD (peripheral  artery disease) Feb 2009    nonobstructing, renal angiogram [name])    Past Surgical History  Procedure Laterality Date  . Heminephrectomy  2004    for renal cell CA  . Renal cryoablation  Jan 2006    right kidney,  [name]       The following portions of the patient's history were reviewed and updated as appropriate: Allergies, current medications, and problem list.    Review of Systems:   Patient denies headache, fevers, malaise, unintentional weight loss, skin rash, eye pain, sinus congestion and sinus pain, sore throat, dysphagia,  hemoptysis , cough, dyspnea, wheezing, chest pain, palpitations, orthopnea, edema, abdominal pain, nausea, melena, diarrhea, constipation, flank pain, dysuria, hematuria, urinary  Frequency, nocturia, numbness, tingling, seizures,  Focal weakness, Loss of consciousness,  Tremor, insomnia, depression, anxiety, and suicidal ideation.     History   Social History  . Marital Status: Married    Spouse Name: N/A  . Number of Children: N/A  . Years of Education: N/A   Occupational History  . Not on file.   Social History Main Topics  . Smoking status: Current Every Day Smoker -- 1.50 packs/day    Types: Cigarettes  . Smokeless tobacco: Never Used  . Alcohol Use: Yes  . Drug Use: No  . Sexual Activity: Not on file   Other Topics Concern  . Not on file   Social History Narrative    Objective:  Filed Vitals:  08/22/14 0804  BP: 158/84  Pulse: 81  Temp: 97.9 F (36.6 C)  Resp: 16     General appearance: alert, cooperative and appears stated age Ears: normal TM's and external ear canals both ears Throat: lips, mucosa, and tongue normal; teeth and gums normal Neck: no adenopathy, no carotid bruit, supple, symmetrical, trachea midline and thyroid not enlarged, symmetric, no tenderness/mass/nodules Back: symmetric, no curvature. ROM normal. No CVA tenderness. Lungs: clear to auscultation bilaterally Heart: regular rate and rhythm,  S1, S2 normal, no murmur, click, rub or gallop Abdomen: soft, non-tender; bowel sounds normal; no masses,  no organomegaly Pulses: 2+ and symmetric Skin: Skin color, texture, turgor normal. No rashes or lesions Lymph nodes: Cervical, supraclavicular, and axillary nodes normal.  Assessment and Plan:  Presbyacusis From nerve damage b prior audiometry,  Not interested in referral,  Did not tolerate prior trial of hearing aids    Hypertensive renal sclerosis with hypertension Not optimally controlled due to patient intolerance of normotension .  No changes today.  Lab Results  Component Value Date   CREATININE 1.70* 08/22/2014   Lab Results  Component Value Date   NA 137 08/22/2014   K 4.5 08/22/2014   CL 105 08/22/2014   CO2 27 08/22/2014      Kidney disease, chronic, stage III (GFR 30-59 ml/min) Stable by repeat Cr. Hr is avoiding nephrotoxins ncluding NSAIDs  Lab Results  Component Value Date   CREATININE 1.70* 08/22/2014      Controlled diabetes mellitus type 2 with complications Well-controlled on diet alone .  hemoglobin A1c has been consistently less than 7.0 . Patient is up-to-date on eye exams and foot exam was done today.  He has CKD and PAD .  Fasting lipids have been reviewed and statin therapy has been advised and updated according to new ACC guidelines based on patient's very elevated 10 year risk of CAD .  He has been instructed on glucometer use and advised to check sugars to rule out hypoglycemic events.  Lab Results  Component Value Date   HGBA1C 6.6* 05/28/2014   Lab Results  Component Value Date   MICROALBUR 60.9* 05/28/2014   Lab Results  Component Value Date   CHOL 218* 05/28/2014   HDL 24.90* 05/28/2014   LDLCALC 123* 11/08/2013   LDLCALC 125* 11/08/2013   LDLDIRECT 108.0 08/22/2014   TRIG 250.0* 05/28/2014   CHOLHDL 9 05/28/2014                 Updated Medication List Outpatient Encounter Prescriptions as of 08/22/2014   Medication Sig  . amLODipine (NORVASC) 10 MG tablet Take 1 tablet (10 mg total) by mouth daily.  Marland Kitchen aspirin 81 MG tablet Take 1 tablet (81 mg total) by mouth daily.  . irbesartan (AVAPRO) 300 MG tablet Take 1 tablet (300 mg total) by mouth daily.  Marland Kitchen zoster vaccine live, PF, (ZOSTAVAX) 62703 UNT/0.65ML injection Inject 19,400 Units into the skin once.     Orders Placed This Encounter  Procedures  . Comp Met (CMET)  . LDL cholesterol, direct  . HM DIABETES FOOT EXAM    Return for follow up diabetes.

## 2014-08-22 NOTE — Patient Instructions (Addendum)
Your diabetes is still under good control with diet alone  but your a1c has risen slightly compared to last time. your other labs are normal. No medication changes are needed at this time, but please try to change to diet soda and iced tea  limit the number of complex carbohydrates (starches, including white potatoes, Cereals, Cookies and cake) in your diet to as few as possible and make sure you are getting some exercise regularly      Return in July for A1c and Diabetes follow up

## 2014-08-22 NOTE — Progress Notes (Signed)
Pre-visit discussion using our clinic review tool. No additional management support is needed unless otherwise documented below in the visit note.  

## 2014-08-22 NOTE — Assessment & Plan Note (Signed)
From nerve damage b prior audiometry,  Not interested in referral,  Did not tolerate prior trial of hearing aids

## 2014-08-24 ENCOUNTER — Encounter: Payer: Self-pay | Admitting: Internal Medicine

## 2014-08-24 NOTE — Assessment & Plan Note (Addendum)
Not optimally controlled due to patient intolerance of normotension .  No changes today.  Lab Results  Component Value Date   CREATININE 1.70* 08/22/2014   Lab Results  Component Value Date   NA 137 08/22/2014   K 4.5 08/22/2014   CL 105 08/22/2014   CO2 27 08/22/2014

## 2014-08-24 NOTE — Assessment & Plan Note (Signed)
Well-controlled on diet alone .  hemoglobin A1c has been consistently less than 7.0 . Patient is up-to-date on eye exams and foot exam was done today.  He has CKD and PAD .  Fasting lipids have been reviewed and statin therapy has been advised and updated according to new ACC guidelines based on patient's very elevated 10 year risk of CAD .  He has been instructed on glucometer use and advised to check sugars to rule out hypoglycemic events.  Lab Results  Component Value Date   HGBA1C 6.6* 05/28/2014   Lab Results  Component Value Date   MICROALBUR 60.9* 05/28/2014   Lab Results  Component Value Date   CHOL 218* 05/28/2014   HDL 24.90* 05/28/2014   LDLCALC 123* 11/08/2013   LDLCALC 125* 11/08/2013   LDLDIRECT 108.0 08/22/2014   TRIG 250.0* 05/28/2014   CHOLHDL 9 05/28/2014

## 2014-08-24 NOTE — Assessment & Plan Note (Signed)
Stable by repeat Cr. Hr is avoiding nephrotoxins ncluding NSAIDs  Lab Results  Component Value Date   CREATININE 1.70* 08/22/2014

## 2014-08-26 ENCOUNTER — Encounter: Payer: Self-pay | Admitting: *Deleted

## 2014-09-10 NOTE — Discharge Summary (Signed)
PATIENT NAME:  [name], [name] MR#:  [id number] DATE OF BIRTH:  1945-06-03  DATE OF ADMISSION:  01/03/2012 DATE OF DISCHARGE:  01/04/2012  PRIMARY CARE PHYSICIAN:  Deborra Medina, MD  FINAL DIAGNOSES:  1. Transient ischemic attack.  2. Hypertension and bradycardia.  3. Low HDL.  4. Acute renal failure, which resolved.  5. Tobacco abuse.   MEDICATIONS ON DISCHARGE:  1. Aspirin 325 mg daily.  2. Amlodipine 10 mg daily.  3. Atorvastatin 10 mg at bedtime.  4. Hydrochlorothiazide 25 mg daily.  5. Nicotine patch 14 mg to chest wall daily.  6. Lisinopril 40 mg daily.   DIET: Low sodium diet, regular consistency.  FOLLOW-UP: Follow-up with Dr. Derrel Nip in one week.   REASON FOR ADMISSION: The patient was admitted 01/03/2012 and discharged 01/04/2012. Came in with left leg numbness and weakness.   HISTORY OF PRESENT ILLNESS: The patient is a 69 year old man who presented to the Emergency Room with left-sided leg weakness and numbness. He has a history of hypertension and tobacco abuse. He was admitted for suspected transient ischemic attack. CT scan of the head was negative. An MRI of the brain was ordered but could not be done secondary to metal in the head. Ultrasound of the carotids was ordered and echocardiogram ordered. For his creatinine being elevated, acute renal failure, he was given IV fluid hydration.   LABORATORY, DIAGNOSTIC AND RADIOLOGICAL DATA: Troponin was negative. Glucose 105, BUN 13, creatinine 1.57, sodium 141, potassium 4.1, chloride 109, CO2 28, calcium 8.9. Liver function tests included alkaline phosphatase slightly elevated at 179. Other liver function tests normal. White blood cell count 11.7, hemoglobin and hematocrit 13.8 and 39.4, platelet count 220. INR normal. Chest x-ray showed no acute cardiopulmonary disease. CT scan of the head without contrast showed no acute intracranial abnormality. Urinalysis negative. Ultrasound of the carotids showed no evidence of stenosis. LDL  98, HDL 17, triglycerides 218. Echocardiogram showed no cardiac source of emboli. Left ventricular systolic function normal. Ejection fraction greater than 55%. Impaired left ventricular relaxation. Mild concentric left ventricular hypertrophy.   HOSPITAL COURSE PER PROBLEM LIST:  1. For the patient's transient ischemic attack, symptoms have totally resolved. A power of 5 out of 5 upper and lower extremities. The patient walked around with normal gait. Since the patient does not take aspirin on a daily basis, he was started on an aspirin. Echocardiogram and carotid ultrasound were negative. Unable to get a MRI of the brain secondary to metal in the head. Will treat as a TIA. A statin was started.  2. Hypertension and bradycardia. His Bystolic was stopped. Blood pressure was elevated. He was started on hydrochlorothiazide along with his lisinopril and Norvasc. Blood pressure varied during the hospital stay, as low as 150/70 and as high as 174/63. Close clinical monitoring and titration of medications more aggressively as outpatient. Again, hydrochlorothiazide was started here in the hospital.  3. For the patient's low HDL, quitting smoking will help, exercise will help, starting a statin will help.  4. Acute renal failure. This resolved with IV fluid hydration.  5. Tobacco abuse. Smoking cessation counseling done, three minutes by me. Nicotine patch prescribed upon discharge.   TIME SPENT:  Time spent on discharge was 35 minutes.   ____________________________ Tana Conch. Leslye Peer, MD [id number] D: 01/04/2012 15:41:30 ET T: 01/06/2012 10:03:50 ET JOB#: [id number]  cc: Tana Conch. Leslye Peer, MD, <Dictator> Deborra Medina, MD Marisue Brooklyn MD ELECTRONICALLY SIGNED 01/07/2012 15:30

## 2014-09-10 NOTE — H&P (Signed)
PATIENT NAME:  [name], [name] MR#:  [id number] DATE OF BIRTH:  [birth year]  DATE OF ADMISSION:  01/03/2012  PRIMARY CARE PHYSICIAN: Deborra Medina, MD   CHIEF COMPLAINT: Left leg numbness and weakness.   HISTORY OF PRESENT ILLNESS: This is a 69 year old male who presents to the Emergency Room due to left-sided leg weakness and numbness. The patient woke up in his usual state of health when this morning around 8:30 when he was working in his yard his left leg started to feel a little heavy and numb and then became fairly weak and he sort of went to the ground. He shortly then started developing some left-sided finger numbness along with some slight left-sided facial droop. He came to the ER for further evaluation. The patient's CT of the head here in the ER was essentially normal and his symptoms have now significantly improved and resolved. Hospitalist services were contacted for further treatment and evaluation.   REVIEW OF SYSTEMS: CONSTITUTIONAL: No documented fever, no weight gain, no weight loss. EYES: No blurry or double vision. ENT: No tinnitus, no postnasal drip, no redness of the oropharynx. RESPIRATORY: No cough, no wheeze, no hemoptysis, no dyspnea. CARDIOVASCULAR: No chest pain, no orthopnea, no palpitations, no syncope. GI: No nausea, no vomiting, no diarrhea, no abdominal pain, no melena, no hematochezia. GU: No dysuria, no hematuria. ENDOCRINE: No polyuria or nocturia. No heat or cold intolerance. HEME: No anemia, no bruising, no bleeding. INTEGUMENTARY: No rashes, no lesions. MUSCULOSKELETAL: No arthritis, no swelling, no gout. NEUROLOGIC: Positive numbness. Positive weakness. Positive TIA. No seizure-type activity. PSYCH: No anxiety, no insomnia, no ADD.   PAST MEDICAL HISTORY:  1. Hypertension.  2. Tobacco abuse.   ALLERGIES: No known drug allergies.   SOCIAL HISTORY: Still smokes about a pack to pack and a half per day. Has been smoking for the past 40+ years. No alcohol abuse. No  illicit drug abuse. Lives at home with his wife.   FAMILY HISTORY: Strong for CVA on both mother and father's side of the family. Mother also died from a brain aneurysm.   CURRENT MEDICATIONS:  1. Amlodipine 10 mg daily.  2. Aspirin 325 mg daily.  3. Bystolic 5 mg daily.  4. Lisinopril 40 mg daily.   PHYSICAL EXAMINATION ON ADMISSION:   VITAL SIGNS: Temperature 97.5, pulse 47, respirations 21, blood pressure 171/79, sats 98% on room air.   GENERAL: He is a pleasant appearing male in no apparent distress.   HEENT: Atraumatic, normocephalic. Extraocular muscles are intact. Pupils equal and reactive to light. Sclerae anicteric. No conjunctival injection. No pharyngeal erythema.   NECK: Supple. No jugular venous distention, no bruits, no lymphadenopathy, no thyromegaly.   HEART: Regular rate and rhythm. No murmurs, no rubs, no clicks.   LUNGS: Clear to auscultation bilaterally. No rales, no rhonchi, no wheezes.   ABDOMEN: Soft, flat, nontender, nondistended. Has good bowel sounds. No hepatosplenomegaly appreciated.   EXTREMITIES: No evidence of any cyanosis, clubbing, or peripheral edema. Has +2 pedal and radial pulses bilaterally.   NEUROLOGIC: The patient is alert, awake, and oriented x3 with no focal motor or sensory deficits appreciated bilaterally.   SKIN: Moist and warm with no rash appreciated.   LYMPHATIC: There is no cervical or axillary lymphadenopathy.   LABORATORY AND RADIOLOGICAL DATA: Serum glucose 105, BUN 13, creatinine 1.57, sodium 141, potassium 4.1, chloride 109, bicarb 28. LFTs are within normal limits. Troponin less than 0.02. White cell count 11.7, hemoglobin 13.8, hematocrit 39.4, platelet count 220. Prothrombin  time 12.4. INR 0.9.   The patient did have a CT of the head done without contrast showing no acute intracranial abnormality. He also had a chest x-ray done which showed no evidence of any acute cardiopulmonary disease.   ASSESSMENT AND PLAN: This is a  69 year old male with a history of tobacco abuse and hypertension who presents to the hospital with left-sided weakness and numbness and likely suspected TIA.  1. Left-sided weakness and numbness and suspected TIA. This is the likely diagnosis given the transient symptoms. He is currently asymptomatic. His initial CT of the head is negative. Will get neuro checks every four hours. Continue him on his aspirin as he has not been taking it regularly. Will check a lipid profile in the morning. Will get an MRI of his brain, a carotid duplex, and a two-dimensional echocardiogram.  2. Acute renal failure. The patient's baseline creatinine a few years ago was around 1.4 so there is a question if he has some underlying chronic kidney disease. For now will gently hydrate him with IV fluids and follow his BUN and creatinine in the morning.  3. Hypertension. Continue Norvasc, lisinopril, and p.r.n. hydralazine for now. Tolerate some element of hypertension given his possible acute CVA. I will hold his Bystolic as the patient is currently bradycardic.  4. Tobacco abuse. Will place him on a nicotine patch. I counseled him on quitting smoking anywhere between 1 to 3 minutes.   CODE STATUS: The patient is a FULL CODE.   TIME SPENT WITH THE ADMISSION: 50 minutes.   ____________________________ Belia Heman. Verdell Carmine, MD [id number] D: 01/03/2012 12:46:31 ET T: 01/03/2012 13:02:18 ET JOB#: [id number]  cc: Belia Heman. Verdell Carmine, MD, <Dictator> Deborra Medina, MD Henreitta Leber MD ELECTRONICALLY SIGNED 01/03/2012 13:52

## 2014-10-24 ENCOUNTER — Other Ambulatory Visit: Payer: Self-pay | Admitting: *Deleted

## 2014-10-24 MED ORDER — IRBESARTAN 300 MG PO TABS: 300.0000 mg | ORAL_TABLET | Freq: Every day | ORAL | Status: DC

## 2014-12-02 ENCOUNTER — Ambulatory Visit (INDEPENDENT_AMBULATORY_CARE_PROVIDER_SITE_OTHER): Payer: Medicare Other | Admitting: Internal Medicine

## 2014-12-02 ENCOUNTER — Encounter: Payer: Self-pay | Admitting: Internal Medicine

## 2014-12-02 VITALS — BP 132/90 | HR 74 | Temp 98.0°F | Resp 16 | Wt 164.8 lb

## 2014-12-02 DIAGNOSIS — F17210 Nicotine dependence, cigarettes, uncomplicated: Secondary | ICD-10-CM

## 2014-12-02 DIAGNOSIS — E785 Hyperlipidemia, unspecified: Secondary | ICD-10-CM | POA: Diagnosis not present

## 2014-12-02 DIAGNOSIS — N189 Chronic kidney disease, unspecified: Secondary | ICD-10-CM | POA: Diagnosis not present

## 2014-12-02 DIAGNOSIS — I129 Hypertensive chronic kidney disease with stage 1 through stage 4 chronic kidney disease, or unspecified chronic kidney disease: Secondary | ICD-10-CM

## 2014-12-02 DIAGNOSIS — Z716 Tobacco abuse counseling: Secondary | ICD-10-CM

## 2014-12-02 DIAGNOSIS — E1122 Type 2 diabetes mellitus with diabetic chronic kidney disease: Secondary | ICD-10-CM | POA: Diagnosis not present

## 2014-12-02 DIAGNOSIS — E1129 Type 2 diabetes mellitus with other diabetic kidney complication: Secondary | ICD-10-CM | POA: Insufficient documentation

## 2014-12-02 DIAGNOSIS — E118 Type 2 diabetes mellitus with unspecified complications: Secondary | ICD-10-CM

## 2014-12-02 DIAGNOSIS — E1169 Type 2 diabetes mellitus with other specified complication: Secondary | ICD-10-CM | POA: Insufficient documentation

## 2014-12-02 DIAGNOSIS — I739 Peripheral vascular disease, unspecified: Secondary | ICD-10-CM

## 2014-12-02 LAB — LIPID PANEL
Cholesterol: 176 mg/dL (ref 0–200)
HDL: 26.2 mg/dL — ABNORMAL LOW (ref >39.00)
NonHDL: 149.8
Total CHOL/HDL Ratio: 7
Triglycerides: 222 mg/dL — ABNORMAL HIGH (ref 0.0–149.0)
VLDL: 44.4 mg/dL — ABNORMAL HIGH (ref 0.0–40.0)

## 2014-12-02 LAB — COMPREHENSIVE METABOLIC PANEL
ALT: 14 U/L (ref 0–53)
AST: 16 U/L (ref 0–37)
Albumin: 3.9 g/dL (ref 3.5–5.2)
Alkaline Phosphatase: 154 U/L — ABNORMAL HIGH (ref 39–117)
BUN: 16 mg/dL (ref 6–23)
CO2: 25 mEq/L (ref 19–32)
Calcium: 9.8 mg/dL (ref 8.4–10.5)
Chloride: 106 mEq/L (ref 96–112)
Creatinine, Ser: 1.7 mg/dL — ABNORMAL HIGH (ref 0.40–1.50)
GFR: 42.68 mL/min — ABNORMAL LOW (ref >60.00)
Glucose, Bld: 113 mg/dL — ABNORMAL HIGH (ref 70–99)
Potassium: 4.6 mEq/L (ref 3.5–5.1)
Sodium: 140 mEq/L (ref 135–145)
Total Bilirubin: 0.6 mg/dL (ref 0.2–1.2)
Total Protein: 7.4 g/dL (ref 6.0–8.3)

## 2014-12-02 LAB — HEMOGLOBIN A1C: Hgb A1c MFr Bld: 6 % (ref 4.6–6.5)

## 2014-12-02 LAB — MICROALBUMIN / CREATININE URINE RATIO
Creatinine,U: 52.5 mg/dL
Microalb Creat Ratio: 65 mg/g — ABNORMAL HIGH (ref 0.0–30.0)
Microalb, Ur: 34.1 mg/dL — ABNORMAL HIGH (ref 0.0–1.9)

## 2014-12-02 LAB — LDL CHOLESTEROL, DIRECT: Direct LDL: 116 mg/dL

## 2014-12-02 NOTE — Progress Notes (Signed)
Subjective:  Patient ID: [name], male    DOB: Nov 20, 1945  Age: 69 y.o. MRN: [id number]  CC: The primary encounter diagnosis was Controlled diabetes mellitus type 2 with complications. Diagnoses of Type 2 diabetes mellitus with diabetic chronic kidney disease, Tobacco abuse counseling, Hypertensive renal sclerosis with hypertension, stage 1-4 or unspecified chronic kidney disease, and PAD (peripheral artery disease) were also pertinent to this visit.  HPI [name] presents for 6 month follow up on diet controlled DM ,  Hypertension,  CKD and hyperlipidemia. Feels generally well except for post nasal drip and some allergic rhinitis symptoms which are mild,  No fevers, facial pain or sinus pain.  Occasional nonproductive cough.  Does not check sugars or exercise regularly but has lost weight unintentionally due to summer heat and decreased appetite.  Over due for eye exam done last April.  Still Smoking 1.5 packs daily,  Foot exam normal    Outpatient Prescriptions Prior to Visit  Medication Sig Dispense Refill  . amLODipine (NORVASC) 10 MG tablet Take 1 tablet (10 mg total) by mouth daily. 90 tablet 3  . aspirin 81 MG tablet Take 1 tablet (81 mg total) by mouth daily. 30 tablet 11  . irbesartan (AVAPRO) 300 MG tablet Take 1 tablet (300 mg total) by mouth daily. 30 tablet 5  . zoster vaccine live, PF, (ZOSTAVAX) 29562 UNT/0.65ML injection Inject 19,400 Units into the skin once. 1 each 0   No facility-administered medications prior to visit.    Review of Systems;  Patient denies headache, fevers, malaise, unintentional weight loss, skin rash, eye pain, sinus congestion and sinus pain, sore throat, dysphagia,  hemoptysis , cough, dyspnea, wheezing, chest pain, palpitations, orthopnea, edema, abdominal pain, nausea, melena, diarrhea, constipation, flank pain, dysuria, hematuria, urinary  Frequency, nocturia, numbness, tingling, seizures,  Focal weakness, Loss of consciousness,   Tremor, insomnia, depression, anxiety, and suicidal ideation.      Objective:  BP 132/90 mmHg  Pulse 74  Temp(Src) 98 F (36.7 C) (Oral)  Resp 16  Wt 164 lb 12.8 oz (74.753 kg)  SpO2 96%  BP Readings from Last 3 Encounters:  12/02/14 132/90  08/22/14 158/84  05/16/14 158/84    Wt Readings from Last 3 Encounters:  12/02/14 164 lb 12.8 oz (74.753 kg)  08/22/14 171 lb 12 oz (77.905 kg)  05/16/14 172 lb 4 oz (78.132 kg)    General appearance: alert, cooperative and appears stated age Ears: normal TM's and external ear canals both ears Throat: lips, mucosa, and tongue normal; teeth and gums normal Neck: no adenopathy, no carotid bruit, supple, symmetrical, trachea midline and thyroid not enlarged, symmetric, no tenderness/mass/nodules Back: symmetric, no curvature. ROM normal. No CVA tenderness. Lungs: clear to auscultation bilaterally Heart: regular rate and rhythm, S1, S2 normal, no murmur, click, rub or gallop Abdomen: soft, non-tender; bowel sounds normal; no masses,  no organomegaly Pulses: 2+ and symmetric Skin: Skin color, texture, turgor normal. No rashes or lesions Lymph nodes: Cervical, supraclavicular, and axillary nodes normal.  Lab Results  Component Value Date   HGBA1C 6.0 12/02/2014   HGBA1C 6.6* 05/28/2014   HGBA1C 6.1 02/15/2014    Lab Results  Component Value Date   CREATININE 1.70* 12/02/2014   CREATININE 1.70* 08/22/2014   CREATININE 1.8* 05/28/2014    Lab Results  Component Value Date   WBC 10.8* 05/28/2014   HGB 14.9 05/28/2014   HCT 44.1 05/28/2014   PLT 264.0 05/28/2014   GLUCOSE 113* 12/02/2014  CHOL 176 12/02/2014   TRIG 222.0* 12/02/2014   HDL 26.20* 12/02/2014   LDLDIRECT 116.0 12/02/2014   LDLCALC 123* 11/08/2013   LDLCALC 125* 11/08/2013   ALT 14 12/02/2014   AST 16 12/02/2014   NA 140 12/02/2014   K 4.6 12/02/2014   CL 106 12/02/2014   CREATININE 1.70* 12/02/2014   BUN 16 12/02/2014   CO2 25 12/02/2014   TSH 3.95  11/08/2013   PSA 4.34* 07/26/2013   INR 0.9 01/03/2012   HGBA1C 6.0 12/02/2014   MICROALBUR 34.1* 12/02/2014    No results found.  Assessment & Plan:   Problem List Items Addressed This Visit      Unprioritized   Tobacco abuse counseling    He was counselled for the  Third time and remains contemplative despite acknowledging the risks of recurrent cancer, lung cancer,. Strokes and CAD.       PAD (peripheral artery disease)    Currently asymptomatic,  Good pedal pulses.       Hypertensive renal sclerosis with hypertension    Not optimally controlled due to patient intolerance of normotension , but improved BP today and renal function is stable.  No changes today.  Lab Results  Component Value Date   CREATININE 1.70* 12/02/2014   Lab Results  Component Value Date   NA 140 12/02/2014   K 4.6 12/02/2014   CL 106 12/02/2014   CO2 25 12/02/2014   Lab Results  Component Value Date   CREATININE 1.70* 12/02/2014          DM (diabetes mellitus), type 2 with renal complications    Well-controlled on diet alone .  hemoglobin A1c has been consistently less than 7.0 . Patient is NOT  up-to-date on eye exams and foot exam was done today.  He has CKD , proteinuria and PAD . He is taking an ARB.  Fasting lipids have been reviewed but he is intolerant of  statin therapy .   Lab Results  Component Value Date   HGBA1C 6.0 12/02/2014   Lab Results  Component Value Date   MICROALBUR 34.1* 12/02/2014   Lab Results  Component Value Date   CHOL 176 12/02/2014   HDL 26.20* 12/02/2014   LDLCALC 123* 11/08/2013   LDLCALC 125* 11/08/2013   LDLDIRECT 116.0 12/02/2014   TRIG 222.0* 12/02/2014   CHOLHDL 7 12/02/2014                     Relevant Orders   Comprehensive metabolic panel (Completed)   Hemoglobin A1c (Completed)   Lipid panel (Completed)   Microalbumin / creatinine urine ratio (Completed)   LDL cholesterol, direct (Completed)   Controlled diabetes  mellitus type 2 with complications - Primary      I am having [name] maintain his zoster vaccine live (PF), aspirin, amLODipine, and irbesartan.  No orders of the defined types were placed in this encounter.    There are no discontinued medications.  Follow-up: No Follow-up on file.   Crecencio Mc, MD

## 2014-12-02 NOTE — Progress Notes (Signed)
Pre visit review using our clinic review tool, if applicable. No additional management support is needed unless otherwise documented below in the visit note. 

## 2014-12-02 NOTE — Patient Instructions (Addendum)
Try taking 25 dipenhydramine (generic benadryl) at bedtime to help with the sinus drainage  Please consider cutting back on your cigarettes gradually    Diabetic Nephropathy Diabetic nephropathy is a complication of diabetes that leads to damaged kidneys. It develops slowly. The function of healthy kidneys is to filter and clean blood. Kidneys also get rid of body waste products and extra fluid. When the kidney filters are damaged, there is protein loss in the urine, a decline in kidney function, a buildup of kidney waste products and fluid, and high blood pressure. The damage progresses until the kidneys fail.  RISK FACTORS  High blood pressure (hypertension).  High blood sugar (hyperglycemia).  Family history.  Aging.  Obstruction problems affecting the kidneys, the tubes that drain the kidneys (ureters), or the bladder.  Taking certain drugs or medicines. SYMPTOMS  Symptoms may not be seen or felt for many years. You may not notice any signs of kidney failure until your kidneys have lost much of their ability to function. An early sign of damage is when small amounts of protein (albumin) leak into the urine. However, this can only be found through a urine test. Without physical symptoms, a urine test is often not performed. When the kidneys fail, you may feel one or more of the following:  Swelling of the hands and feet from the extra fluid in your body.  Constant upset stomach.  Constant fatigue. DIAGNOSIS When someone has diabetes, screening tests are done to look for any early signs of problems before symptoms develop and before damage has already been done. These tests may include:   Annual urine tests to screen for trace amounts of protein in the urine (microalbuminuria).  Urine collectionover 24 hours to measure kidney function.  Blood tests that measure kidney function. Your caregiver is aware that problems other than diabetes can damage kidneys. If screening tests show  early kidney damage, but it is thought that a different problem is causing the damage, other tests may be performed. Examples of these tests include:  An ultrasound of your kidney.  Taking a tissue sample (biopsy) from the kidney. TREATMENT The goal of treatment is to prevent or slow down damage to your kidneys. Controlling hypertension and hyperglycemia is critical. Your goal is to maintain a blood pressure below 120/80. If you have certain other medical problems, this goal may be different. Talk to your caregiver to make sure that your blood pressure goal is right for your needs. Regular testing of your blood glucose at home is important. Your goal is to have a normal blood glucose (110 or less when fasting) as often as possible.  In addition, maintaining your hemoglobin A1c level at less than 7% reduces your risk for complications, including kidney damage. Common treatments include:  Dieting by controlling what you eat as well as the portion sizes.  Exercising to control blood pressure and blood glucose.  Taking medicines.  Giving yourself insulin injections if your caregiver feels that it is necessary.  Getting early treatment for urinary tract infections.  Regularly following up with your caregiver. If your disease progresses to end-stage kidney failure, you will need dialysis or a transplant. Dialysis can be done in 1 of 2 ways:  Hemodialysis. Your blood flows from a tube in your arm through a machine. The machine filters waste and extra fluid. The clean blood flows back into your arm.  Peritoneal dialysis. Your abdomen is filled with a special fluid. The fluid collects waste products and extra fluid  from your blood. The fluid is then drained from your abdomen and discarded. SEEK MEDICAL CARE IF:   You are having problems keeping your blood glucose in the goal range.  You have swelling of the hands or feet.  You have weakness.  You have muscles spasms.  You have a constant  upset stomach.  You feel tired all the time and this is not normal for you. SEEK IMMEDIATE MEDICAL CARE IF:  You have unusual dizziness or weakness.  You have excessive sleepiness.  You have a seizure or convulsion.  You have severe, painful muscle spasms.  You have shortness of breath or trouble breathing.  You pass out or have a fainting episode.  You have chest pains. MAKE SURE YOU:  Understand these instructions.  Will watch your condition.  Will get help right away if you are not doing well or get worse. Document Released: 05/30/2007 Document Revised: 01/10/2013 Document Reviewed: 12/30/2010 [facility] Patient Information 2015 [place], [place]. This information is not intended to replace advice given to you by your health care provider. Make sure you discuss any questions you have with your health care provider.

## 2014-12-03 NOTE — Assessment & Plan Note (Signed)
Currently asymptomatic,  Good pedal pulses.

## 2014-12-03 NOTE — Assessment & Plan Note (Signed)
He was counselled for the  Third time and remains contemplative despite acknowledging the risks of recurrent cancer, lung cancer,. Strokes and CAD.

## 2014-12-03 NOTE — Assessment & Plan Note (Signed)
Not optimally controlled due to patient intolerance of normotension , but improved BP today and renal function is stable.  No changes today.  Lab Results  Component Value Date   CREATININE 1.70* 12/02/2014   Lab Results  Component Value Date   NA 140 12/02/2014   K 4.6 12/02/2014   CL 106 12/02/2014   CO2 25 12/02/2014   Lab Results  Component Value Date   CREATININE 1.70* 12/02/2014

## 2014-12-03 NOTE — Assessment & Plan Note (Signed)
Well-controlled on diet alone .  hemoglobin A1c has been consistently less than 7.0 . Patient is NOT  up-to-date on eye exams and foot exam was done today.  He has CKD , proteinuria and PAD . He is taking an ARB.  Fasting lipids have been reviewed but he is intolerant of  statin therapy .   Lab Results  Component Value Date   HGBA1C 6.0 12/02/2014   Lab Results  Component Value Date   MICROALBUR 34.1* 12/02/2014   Lab Results  Component Value Date   CHOL 176 12/02/2014   HDL 26.20* 12/02/2014   LDLCALC 123* 11/08/2013   LDLCALC 125* 11/08/2013   LDLDIRECT 116.0 12/02/2014   TRIG 222.0* 12/02/2014   CHOLHDL 7 12/02/2014

## 2014-12-05 ENCOUNTER — Encounter: Payer: Self-pay | Admitting: *Deleted

## 2015-01-24 ENCOUNTER — Other Ambulatory Visit: Payer: Self-pay | Admitting: Internal Medicine

## 2015-02-17 DIAGNOSIS — L308 Other specified dermatitis: Secondary | ICD-10-CM | POA: Diagnosis not present

## 2015-02-17 DIAGNOSIS — B360 Pityriasis versicolor: Secondary | ICD-10-CM | POA: Diagnosis not present

## 2015-02-17 DIAGNOSIS — D485 Neoplasm of uncertain behavior of skin: Secondary | ICD-10-CM | POA: Diagnosis not present

## 2015-02-17 DIAGNOSIS — L821 Other seborrheic keratosis: Secondary | ICD-10-CM | POA: Diagnosis not present

## 2015-02-17 DIAGNOSIS — L578 Other skin changes due to chronic exposure to nonionizing radiation: Secondary | ICD-10-CM | POA: Diagnosis not present

## 2015-02-17 DIAGNOSIS — D229 Melanocytic nevi, unspecified: Secondary | ICD-10-CM | POA: Diagnosis not present

## 2015-02-17 DIAGNOSIS — Z1283 Encounter for screening for malignant neoplasm of skin: Secondary | ICD-10-CM | POA: Diagnosis not present

## 2015-02-17 DIAGNOSIS — Z85828 Personal history of other malignant neoplasm of skin: Secondary | ICD-10-CM | POA: Diagnosis not present

## 2015-02-17 DIAGNOSIS — D692 Other nonthrombocytopenic purpura: Secondary | ICD-10-CM | POA: Diagnosis not present

## 2015-04-09 DIAGNOSIS — B360 Pityriasis versicolor: Secondary | ICD-10-CM | POA: Diagnosis not present

## 2015-04-09 DIAGNOSIS — L111 Transient acantholytic dermatosis [Grover]: Secondary | ICD-10-CM | POA: Diagnosis not present

## 2015-04-09 DIAGNOSIS — L821 Other seborrheic keratosis: Secondary | ICD-10-CM | POA: Diagnosis not present

## 2015-04-10 ENCOUNTER — Other Ambulatory Visit: Payer: Self-pay | Admitting: Internal Medicine

## 2015-05-12 DIAGNOSIS — N529 Male erectile dysfunction, unspecified: Secondary | ICD-10-CM | POA: Diagnosis not present

## 2015-05-12 DIAGNOSIS — R972 Elevated prostate specific antigen [PSA]: Secondary | ICD-10-CM | POA: Diagnosis not present

## 2015-05-21 DIAGNOSIS — L308 Other specified dermatitis: Secondary | ICD-10-CM | POA: Diagnosis not present

## 2015-05-21 DIAGNOSIS — D1800 Hemangioma unspecified site: Secondary | ICD-10-CM | POA: Diagnosis not present

## 2015-05-21 DIAGNOSIS — L821 Other seborrheic keratosis: Secondary | ICD-10-CM | POA: Diagnosis not present

## 2015-05-21 DIAGNOSIS — D485 Neoplasm of uncertain behavior of skin: Secondary | ICD-10-CM | POA: Diagnosis not present

## 2015-05-21 DIAGNOSIS — L989 Disorder of the skin and subcutaneous tissue, unspecified: Secondary | ICD-10-CM | POA: Diagnosis not present

## 2015-05-21 DIAGNOSIS — Z85828 Personal history of other malignant neoplasm of skin: Secondary | ICD-10-CM | POA: Diagnosis not present

## 2015-05-21 DIAGNOSIS — B360 Pityriasis versicolor: Secondary | ICD-10-CM | POA: Diagnosis not present

## 2015-05-21 DIAGNOSIS — L853 Xerosis cutis: Secondary | ICD-10-CM | POA: Diagnosis not present

## 2015-06-11 ENCOUNTER — Other Ambulatory Visit: Payer: Self-pay | Admitting: Internal Medicine

## 2015-06-17 DIAGNOSIS — C61 Malignant neoplasm of prostate: Secondary | ICD-10-CM | POA: Diagnosis not present

## 2015-06-17 DIAGNOSIS — R972 Elevated prostate specific antigen [PSA]: Secondary | ICD-10-CM | POA: Diagnosis not present

## 2015-07-09 DIAGNOSIS — M5126 Other intervertebral disc displacement, lumbar region: Secondary | ICD-10-CM | POA: Diagnosis not present

## 2015-07-09 DIAGNOSIS — M545 Low back pain: Secondary | ICD-10-CM | POA: Diagnosis not present

## 2015-07-18 ENCOUNTER — Encounter: Payer: Self-pay | Admitting: Internal Medicine

## 2015-07-18 ENCOUNTER — Ambulatory Visit (INDEPENDENT_AMBULATORY_CARE_PROVIDER_SITE_OTHER): Payer: Medicare Other | Admitting: Internal Medicine

## 2015-07-18 VITALS — BP 170/80 | HR 73 | Temp 98.1°F | Resp 12 | Ht 67.0 in | Wt 166.5 lb

## 2015-07-18 DIAGNOSIS — N183 Chronic kidney disease, stage 3 unspecified: Secondary | ICD-10-CM

## 2015-07-18 DIAGNOSIS — E1121 Type 2 diabetes mellitus with diabetic nephropathy: Secondary | ICD-10-CM | POA: Diagnosis not present

## 2015-07-18 DIAGNOSIS — E785 Hyperlipidemia, unspecified: Secondary | ICD-10-CM

## 2015-07-18 DIAGNOSIS — M5431 Sciatica, right side: Secondary | ICD-10-CM

## 2015-07-18 DIAGNOSIS — E1169 Type 2 diabetes mellitus with other specified complication: Secondary | ICD-10-CM

## 2015-07-18 DIAGNOSIS — M25552 Pain in left hip: Secondary | ICD-10-CM | POA: Diagnosis not present

## 2015-07-18 DIAGNOSIS — E1129 Type 2 diabetes mellitus with other diabetic kidney complication: Secondary | ICD-10-CM | POA: Diagnosis not present

## 2015-07-18 MED ORDER — PREDNISONE 10 MG PO TABS: ORAL_TABLET | ORAL | Status: DC

## 2015-07-18 MED ORDER — HYDROCODONE-ACETAMINOPHEN 5-325 MG PO TABS: 1.0000 | ORAL_TABLET | Freq: Four times a day (QID) | ORAL | Status: DC | PRN

## 2015-07-18 MED ORDER — METHYLPREDNISOLONE ACETATE 40 MG/ML IJ SUSP
40.0000 mg | Freq: Once | INTRAMUSCULAR | Status: AC
  Administered 2015-07-18: 40 mg via INTRAMUSCULAR

## 2015-07-18 MED ORDER — AMLODIPINE BESYLATE 10 MG PO TABS: 10.0000 mg | ORAL_TABLET | Freq: Every day | ORAL | Status: DC

## 2015-07-18 MED ORDER — IRBESARTAN 300 MG PO TABS: 300.0000 mg | ORAL_TABLET | Freq: Every day | ORAL | Status: DC

## 2015-07-18 NOTE — Progress Notes (Signed)
Pre-visit discussion using our clinic review tool. No additional management support is needed unless otherwise documented below in the visit note.  

## 2015-07-18 NOTE — Patient Instructions (Signed)
You were given a steroid injection today, so you can start the prednisone tomorrow  Take 60 mg daily for 3 days,  Then begin the tapering dose (50 mg on Day 4,  40 mg on Day 5,  Etc)   vicodin is constipating ,  So if you use it,  Take a sennakot or dulcolax nightly

## 2015-07-18 NOTE — Progress Notes (Signed)
Subjective:  Patient ID: [name], male    DOB: [birth year]  Age: 70 y.o. MRN: [id number]  CC: The primary encounter diagnosis was Type 2 diabetes mellitus with diabetic nephropathy, without long-term current use of insulin ([place]). Diagnoses of Chronic kidney disease (CKD), stage III (moderate), Hyperlipidemia associated with type 2 diabetes mellitus ([place]), Left hip pain, Kidney disease, chronic, stage III (GFR 30-59 ml/min), and Sciatica of right side were also pertinent to this visit.  HPI [name] presents for follow up on Type 2 DM with renal disease,  diet controlled hypertension, and peripheral artery disease.  He feels generally poor due to back pain,  Is not exercising  Or  checking blood sugars .   Denies numbness, burning and tingling of extremities. Appetite is good.    He has been suffering from sciatica right side. His pain is severe and radiates to his right foot.   Has been present for several months , and initially was seeing chiropractic treatment which helped relieve his pain for a few weeks. but has been getting worse over the last month .   Last week he saw a neurosurgeon  at [facility]  And was  treated with a prednisone dose pack . Te prednisone relieved his pain at the higher doses, but as the dose dropped the pain returned. He has been taking ibuprofen and tylenol and reports severe pain tha tis making it hard to sit and sleep. Not tolerating gabapentin due to side effects   MRI is scheduled for Monday , and has a follow up with Neurosurgeon in 6 weeks    Lab Results  Component Value Date   HGBA1C 6.4* 07/18/2015    Outpatient Prescriptions Prior to Visit  Medication Sig Dispense Refill  . aspirin 81 MG tablet Take 1 tablet (81 mg total) by mouth daily. 30 tablet 11  . amLODipine (NORVASC) 10 MG tablet Take 1 tablet (10 mg total) by mouth daily. 90 tablet 3  . irbesartan (AVAPRO) 300 MG tablet Take 1 tablet (300 mg total) by mouth daily. 30 tablet 5  . zoster  vaccine live, PF, (ZOSTAVAX) 13086 UNT/0.65ML injection Inject 19,400 Units into the skin once. (Patient not taking: Reported on 07/18/2015) 1 each 0  . amLODipine (NORVASC) 10 MG tablet TAKE ONE TABLET BY MOUTH EVERY DAY 30 tablet 5   No facility-administered medications prior to visit.    Review of Systems;  Patient denies headache, fevers, malaise, unintentional weight loss, skin rash, eye pain, sinus congestion and sinus pain, sore throat, dysphagia,  hemoptysis , cough, dyspnea, wheezing, chest pain, palpitations, orthopnea, edema, abdominal pain, nausea, melena, diarrhea, constipation, flank pain, dysuria, hematuria, urinary  Frequency, nocturia, numbness, tingling, seizures,  Focal weakness, Loss of consciousness,  Tremor, insomnia, depression, anxiety, and suicidal ideation.      Objective:  BP 170/80 mmHg  Pulse 73  Temp(Src) 98.1 F (36.7 C) (Oral)  Resp 12  Ht 5\' 7"  (1.702 m)  Wt 166 lb 8 oz (75.524 kg)  BMI 26.07 kg/m2  SpO2 98%  BP Readings from Last 3 Encounters:  07/18/15 170/80  12/02/14 132/90  08/22/14 158/84    Wt Readings from Last 3 Encounters:  07/18/15 166 lb 8 oz (75.524 kg)  12/02/14 164 lb 12.8 oz (74.753 kg)  08/22/14 171 lb 12 oz (77.905 kg)    General appearance: alert, cooperative and appears stated age Ears: normal TM's and external ear canals both ears Throat: lips, mucosa, and tongue normal; teeth and  gums normal Neck: no adenopathy, no carotid bruit, supple, symmetrical, trachea midline and thyroid not enlarged, symmetric, no tenderness/mass/nodules Back: symmetric, no curvature. ROM normal. No CVA tenderness. Lungs: clear to auscultation bilaterally Heart: regular rate and rhythm, S1, S2 normal, no murmur, click, rub or gallop Abdomen: soft, non-tender; bowel sounds normal; no masses,  no organomegaly Pulses: 2+ and symmetric MSK: positive straight leg lift on right , negative on the left.  DTRS normal.   Skin: Skin color, texture, turgor  normal. No rashes or lesions Lymph nodes: Cervical, supraclavicular, and axillary nodes normal.  Lab Results  Component Value Date   HGBA1C 6.4* 07/18/2015   HGBA1C 6.0 12/02/2014   HGBA1C 6.6* 05/28/2014    Lab Results  Component Value Date   CREATININE 1.92* 07/18/2015   CREATININE 1.70* 12/02/2014   CREATININE 1.70* 08/22/2014    Lab Results  Component Value Date   WBC 10.8* 05/28/2014   HGB 14.9 05/28/2014   HCT 44.1 05/28/2014   PLT 264.0 05/28/2014   GLUCOSE 116* 07/18/2015   CHOL 176 12/02/2014   TRIG 222.0* 12/02/2014   HDL 26.20* 12/02/2014   LDLDIRECT 128 07/18/2015   LDLCALC 123* 11/08/2013   LDLCALC 125* 11/08/2013   ALT 8* 07/18/2015   AST 9* 07/18/2015   NA 137 07/18/2015   K 4.4 07/18/2015   CL 104 07/18/2015   CREATININE 1.92* 07/18/2015   BUN 27* 07/18/2015   CO2 23 07/18/2015   TSH 3.95 11/08/2013   PSA 4.34* 07/26/2013   INR 0.9 01/03/2012   HGBA1C 6.4* 07/18/2015   MICROALBUR 27.4 07/18/2015    No results found.  Assessment & Plan:   Problem List Items Addressed This Visit    Kidney disease, chronic, stage III (GFR 30-59 ml/min)    Stable by repeat Cr. He has been using NSAIDs due to severe pain .  advised him to stop  Lab Results  Component Value Date   CREATININE 1.92* 07/18/2015    Lab Results  Component Value Date   NA 137 07/18/2015   K 4.4 07/18/2015   CL 104 07/18/2015   CO2 23 07/18/2015          DM (diabetes mellitus), type 2 with renal complications ([place]) - Primary    Well-controlled on diet alone .  hemoglobin A1c has been consistently less than 7.0 . Patient is NOT  up-to-date on eye exams and foot exam was done today.  He has CKD , proteinuria and PAD . He is taking an ARB.  Fasting lipids have been reviewed but he is intolerant of  statin therapy .   Lab Results  Component Value Date   HGBA1C 6.4* 07/18/2015   Lab Results  Component Value Date   MICROALBUR 27.4 07/18/2015   Lab Results  Component Value  Date   CHOL 176 12/02/2014   HDL 26.20* 12/02/2014   LDLCALC 123* 11/08/2013   LDLCALC 125* 11/08/2013   LDLDIRECT 128 07/18/2015   TRIG 222.0* 12/02/2014   CHOLHDL 7 12/02/2014                       Relevant Medications   irbesartan (AVAPRO) 300 MG tablet   Other Relevant Orders   Hemoglobin A1c (Completed)   Microalbumin / creatinine urine ratio (Completed)   Sciatica of right side    MRI scheduled to determine the cause is : DDD vs metastasis of renal cell or prostate CA .  Vicodin and prednisone taper  Relevant Medications   gabapentin (NEURONTIN) 300 MG capsule   Chronic kidney disease (CKD), stage III (moderate)   Relevant Orders   Comprehensive metabolic panel (Completed)   Left hip pain   Relevant Medications   methylPREDNISolone acetate (DEPO-MEDROL) injection 40 mg (Completed)    Other Visit Diagnoses    Hyperlipidemia associated with type 2 diabetes mellitus ([place])        Relevant Medications    irbesartan (AVAPRO) 300 MG tablet    amLODipine (NORVASC) 10 MG tablet    Other Relevant Orders    LDL cholesterol, direct (Completed)       I am having Mr. Luebbe start on predniSONE. I am also having him maintain his zoster vaccine live (PF), aspirin, gabapentin, irbesartan, amLODipine, and HYDROcodone-acetaminophen. We administered methylPREDNISolone acetate.  Meds ordered this encounter  Medications  . gabapentin (NEURONTIN) 300 MG capsule    Sig: Take 1 capsule by mouth 3 (three) times daily.  . predniSONE (DELTASONE) 10 MG tablet    Sig: 6 daily for 3 days,  then reduce by 1 tablet daily until gone    Dispense:  33 tablet    Refill:  0  . DISCONTD: HYDROcodone-acetaminophen (NORCO/VICODIN) 5-325 MG tablet    Sig: Take 1 tablet by mouth every 6 (six) hours as needed for moderate pain.    Dispense:  30 tablet    Refill:  0  . irbesartan (AVAPRO) 300 MG tablet    Sig: Take 1 tablet (300 mg total) by mouth daily.    Dispense:  90 tablet     Refill:  2  . amLODipine (NORVASC) 10 MG tablet    Sig: Take 1 tablet (10 mg total) by mouth daily.    Dispense:  90 tablet    Refill:  3  . HYDROcodone-acetaminophen (NORCO/VICODIN) 5-325 MG tablet    Sig: Take 1 tablet by mouth every 6 (six) hours as needed for moderate pain.    Dispense:  90 tablet    Refill:  0  . methylPREDNISolone acetate (DEPO-MEDROL) injection 40 mg    Sig:     Medications Discontinued During This Encounter  Medication Reason  . amLODipine (NORVASC) 10 MG tablet Error  . irbesartan (AVAPRO) 300 MG tablet Reorder  . amLODipine (NORVASC) 10 MG tablet Reorder  . HYDROcodone-acetaminophen (NORCO/VICODIN) 5-325 MG tablet Reorder    Follow-up: No Follow-up on file.   Crecencio Mc, MD

## 2015-07-19 LAB — COMPREHENSIVE METABOLIC PANEL
ALT: 8 U/L — ABNORMAL LOW (ref 9–46)
AST: 9 U/L — ABNORMAL LOW (ref 10–35)
Albumin: 3.4 g/dL — ABNORMAL LOW (ref 3.6–5.1)
Alkaline Phosphatase: 88 U/L (ref 40–115)
BUN: 27 mg/dL — ABNORMAL HIGH (ref 7–25)
CO2: 23 mmol/L (ref 20–31)
Calcium: 8.8 mg/dL (ref 8.6–10.3)
Chloride: 104 mmol/L (ref 98–110)
Creat: 1.92 mg/dL — ABNORMAL HIGH (ref 0.70–1.25)
Glucose, Bld: 116 mg/dL — ABNORMAL HIGH (ref 65–99)
Potassium: 4.4 mmol/L (ref 3.5–5.3)
Sodium: 137 mmol/L (ref 135–146)
Total Bilirubin: 0.4 mg/dL (ref 0.2–1.2)
Total Protein: 6.6 g/dL (ref 6.1–8.1)

## 2015-07-19 LAB — HEMOGLOBIN A1C
Hgb A1c MFr Bld: 6.4 % — ABNORMAL HIGH (ref <5.7)
Mean Plasma Glucose: 137 mg/dL — ABNORMAL HIGH (ref <117)

## 2015-07-19 LAB — MICROALBUMIN / CREATININE URINE RATIO
Creatinine, Urine: 54 mg/dL (ref 20–370)
Microalb Creat Ratio: 507 mcg/mg creat — ABNORMAL HIGH (ref <30)
Microalb, Ur: 27.4 mg/dL

## 2015-07-19 LAB — LDL CHOLESTEROL, DIRECT: Direct LDL: 128 mg/dL (ref <130)

## 2015-07-20 DIAGNOSIS — M5431 Sciatica, right side: Secondary | ICD-10-CM | POA: Insufficient documentation

## 2015-07-20 NOTE — Assessment & Plan Note (Signed)
Well-controlled on diet alone .  hemoglobin A1c has been consistently less than 7.0 . Patient is NOT  up-to-date on eye exams and foot exam was done today.  He has CKD , proteinuria and PAD . He is taking an ARB.  Fasting lipids have been reviewed but he is intolerant of  statin therapy .   Lab Results  Component Value Date   HGBA1C 6.4* 07/18/2015   Lab Results  Component Value Date   MICROALBUR 27.4 07/18/2015   Lab Results  Component Value Date   CHOL 176 12/02/2014   HDL 26.20* 12/02/2014   LDLCALC 123* 11/08/2013   LDLCALC 125* 11/08/2013   LDLDIRECT 128 07/18/2015   TRIG 222.0* 12/02/2014   CHOLHDL 7 12/02/2014

## 2015-07-20 NOTE — Assessment & Plan Note (Signed)
Stable by repeat Cr. He has been using NSAIDs due to severe pain .  advised him to stop  Lab Results  Component Value Date   CREATININE 1.92* 07/18/2015    Lab Results  Component Value Date   NA 137 07/18/2015   K 4.4 07/18/2015   CL 104 07/18/2015   CO2 23 07/18/2015

## 2015-07-20 NOTE — Assessment & Plan Note (Addendum)
MRI scheduled to determine the cause is : DDD vs metastasis of renal cell or prostate CA .  Vicodin and prednisone taper

## 2015-07-21 ENCOUNTER — Encounter: Payer: Self-pay | Admitting: *Deleted

## 2015-07-21 DIAGNOSIS — M5126 Other intervertebral disc displacement, lumbar region: Secondary | ICD-10-CM | POA: Diagnosis not present

## 2015-07-21 DIAGNOSIS — Z01818 Encounter for other preprocedural examination: Secondary | ICD-10-CM | POA: Diagnosis not present

## 2015-07-21 DIAGNOSIS — M4806 Spinal stenosis, lumbar region: Secondary | ICD-10-CM | POA: Diagnosis not present

## 2015-07-21 DIAGNOSIS — M5116 Intervertebral disc disorders with radiculopathy, lumbar region: Secondary | ICD-10-CM | POA: Diagnosis not present

## 2015-08-06 DIAGNOSIS — M7138 Other bursal cyst, other site: Secondary | ICD-10-CM | POA: Diagnosis not present

## 2015-08-06 DIAGNOSIS — M5416 Radiculopathy, lumbar region: Secondary | ICD-10-CM | POA: Diagnosis not present

## 2015-08-06 DIAGNOSIS — M7130 Other bursal cyst, unspecified site: Secondary | ICD-10-CM | POA: Diagnosis not present

## 2015-08-06 DIAGNOSIS — Z79899 Other long term (current) drug therapy: Secondary | ICD-10-CM | POA: Diagnosis not present

## 2015-10-24 DIAGNOSIS — N529 Male erectile dysfunction, unspecified: Secondary | ICD-10-CM | POA: Diagnosis not present

## 2015-10-24 DIAGNOSIS — C61 Malignant neoplasm of prostate: Secondary | ICD-10-CM | POA: Diagnosis not present

## 2015-10-24 DIAGNOSIS — R972 Elevated prostate specific antigen [PSA]: Secondary | ICD-10-CM | POA: Diagnosis not present

## 2015-10-24 DIAGNOSIS — Z85528 Personal history of other malignant neoplasm of kidney: Secondary | ICD-10-CM | POA: Diagnosis not present

## 2015-10-29 DIAGNOSIS — Z905 Acquired absence of kidney: Secondary | ICD-10-CM | POA: Diagnosis not present

## 2015-10-29 DIAGNOSIS — R9341 Abnormal radiologic findings on diagnostic imaging of renal pelvis, ureter, or bladder: Secondary | ICD-10-CM | POA: Diagnosis not present

## 2015-10-29 DIAGNOSIS — N289 Disorder of kidney and ureter, unspecified: Secondary | ICD-10-CM | POA: Diagnosis not present

## 2015-10-29 DIAGNOSIS — N400 Enlarged prostate without lower urinary tract symptoms: Secondary | ICD-10-CM | POA: Diagnosis not present

## 2015-10-29 DIAGNOSIS — Z85528 Personal history of other malignant neoplasm of kidney: Secondary | ICD-10-CM | POA: Diagnosis not present

## 2015-10-29 DIAGNOSIS — N281 Cyst of kidney, acquired: Secondary | ICD-10-CM | POA: Diagnosis not present

## 2015-11-05 DIAGNOSIS — C61 Malignant neoplasm of prostate: Secondary | ICD-10-CM | POA: Diagnosis not present

## 2015-11-05 DIAGNOSIS — N400 Enlarged prostate without lower urinary tract symptoms: Secondary | ICD-10-CM | POA: Diagnosis not present

## 2015-11-26 DIAGNOSIS — M5126 Other intervertebral disc displacement, lumbar region: Secondary | ICD-10-CM | POA: Diagnosis not present

## 2015-11-27 DIAGNOSIS — M5126 Other intervertebral disc displacement, lumbar region: Secondary | ICD-10-CM | POA: Diagnosis not present

## 2015-11-27 DIAGNOSIS — R0609 Other forms of dyspnea: Secondary | ICD-10-CM | POA: Diagnosis not present

## 2015-11-27 DIAGNOSIS — N183 Chronic kidney disease, stage 3 (moderate): Secondary | ICD-10-CM | POA: Diagnosis not present

## 2015-11-27 DIAGNOSIS — F17200 Nicotine dependence, unspecified, uncomplicated: Secondary | ICD-10-CM | POA: Diagnosis not present

## 2015-11-27 DIAGNOSIS — R05 Cough: Secondary | ICD-10-CM | POA: Diagnosis not present

## 2015-11-27 DIAGNOSIS — I10 Essential (primary) hypertension: Secondary | ICD-10-CM | POA: Diagnosis not present

## 2015-11-27 DIAGNOSIS — R0602 Shortness of breath: Secondary | ICD-10-CM | POA: Diagnosis not present

## 2015-11-27 DIAGNOSIS — C642 Malignant neoplasm of left kidney, except renal pelvis: Secondary | ICD-10-CM | POA: Diagnosis not present

## 2015-11-28 DIAGNOSIS — Z01818 Encounter for other preprocedural examination: Secondary | ICD-10-CM | POA: Diagnosis not present

## 2015-11-28 DIAGNOSIS — R0609 Other forms of dyspnea: Secondary | ICD-10-CM | POA: Diagnosis not present

## 2015-12-02 DIAGNOSIS — I371 Nonrheumatic pulmonary valve insufficiency: Secondary | ICD-10-CM | POA: Diagnosis not present

## 2015-12-02 DIAGNOSIS — I517 Cardiomegaly: Secondary | ICD-10-CM | POA: Diagnosis not present

## 2015-12-02 DIAGNOSIS — R0609 Other forms of dyspnea: Secondary | ICD-10-CM | POA: Diagnosis not present

## 2015-12-25 DIAGNOSIS — M4807 Spinal stenosis, lumbosacral region: Secondary | ICD-10-CM | POA: Diagnosis not present

## 2015-12-25 DIAGNOSIS — M7138 Other bursal cyst, other site: Secondary | ICD-10-CM | POA: Diagnosis not present

## 2015-12-25 DIAGNOSIS — M5126 Other intervertebral disc displacement, lumbar region: Secondary | ICD-10-CM | POA: Diagnosis not present

## 2015-12-25 DIAGNOSIS — M4806 Spinal stenosis, lumbar region: Secondary | ICD-10-CM | POA: Diagnosis not present

## 2015-12-25 DIAGNOSIS — I129 Hypertensive chronic kidney disease with stage 1 through stage 4 chronic kidney disease, or unspecified chronic kidney disease: Secondary | ICD-10-CM | POA: Diagnosis not present

## 2015-12-25 DIAGNOSIS — Z79899 Other long term (current) drug therapy: Secondary | ICD-10-CM | POA: Diagnosis not present

## 2015-12-25 DIAGNOSIS — Z7982 Long term (current) use of aspirin: Secondary | ICD-10-CM | POA: Diagnosis not present

## 2015-12-25 DIAGNOSIS — G4730 Sleep apnea, unspecified: Secondary | ICD-10-CM | POA: Diagnosis not present

## 2015-12-25 DIAGNOSIS — M5417 Radiculopathy, lumbosacral region: Secondary | ICD-10-CM | POA: Diagnosis not present

## 2015-12-25 DIAGNOSIS — N189 Chronic kidney disease, unspecified: Secondary | ICD-10-CM | POA: Diagnosis not present

## 2015-12-25 DIAGNOSIS — M5416 Radiculopathy, lumbar region: Secondary | ICD-10-CM | POA: Diagnosis not present

## 2015-12-25 HISTORY — PX: OTHER SURGICAL HISTORY: SHX169

## 2015-12-26 ENCOUNTER — Ambulatory Visit: Payer: Medicare Other | Admitting: Cardiovascular Disease

## 2015-12-26 DIAGNOSIS — N189 Chronic kidney disease, unspecified: Secondary | ICD-10-CM | POA: Diagnosis not present

## 2015-12-26 DIAGNOSIS — M4806 Spinal stenosis, lumbar region: Secondary | ICD-10-CM | POA: Diagnosis not present

## 2015-12-26 DIAGNOSIS — M7138 Other bursal cyst, other site: Secondary | ICD-10-CM | POA: Diagnosis not present

## 2015-12-26 DIAGNOSIS — M5416 Radiculopathy, lumbar region: Secondary | ICD-10-CM | POA: Diagnosis not present

## 2015-12-26 DIAGNOSIS — G4730 Sleep apnea, unspecified: Secondary | ICD-10-CM | POA: Diagnosis not present

## 2015-12-26 DIAGNOSIS — I129 Hypertensive chronic kidney disease with stage 1 through stage 4 chronic kidney disease, or unspecified chronic kidney disease: Secondary | ICD-10-CM | POA: Diagnosis not present

## 2016-01-06 DIAGNOSIS — Z4889 Encounter for other specified surgical aftercare: Secondary | ICD-10-CM | POA: Diagnosis not present

## 2016-01-19 ENCOUNTER — Other Ambulatory Visit: Payer: Self-pay

## 2016-01-27 ENCOUNTER — Encounter: Payer: Self-pay | Admitting: Cardiovascular Disease

## 2016-01-27 ENCOUNTER — Ambulatory Visit (INDEPENDENT_AMBULATORY_CARE_PROVIDER_SITE_OTHER): Payer: Medicare Other | Admitting: Cardiovascular Disease

## 2016-01-27 VITALS — BP 184/80 | HR 78 | Ht 66.0 in | Wt 160.5 lb

## 2016-01-27 DIAGNOSIS — I10 Essential (primary) hypertension: Secondary | ICD-10-CM | POA: Diagnosis not present

## 2016-01-27 DIAGNOSIS — M79606 Pain in leg, unspecified: Secondary | ICD-10-CM

## 2016-01-27 DIAGNOSIS — I739 Peripheral vascular disease, unspecified: Secondary | ICD-10-CM

## 2016-01-27 DIAGNOSIS — R0989 Other specified symptoms and signs involving the circulatory and respiratory systems: Secondary | ICD-10-CM | POA: Diagnosis not present

## 2016-01-27 DIAGNOSIS — E785 Hyperlipidemia, unspecified: Secondary | ICD-10-CM

## 2016-01-27 NOTE — Patient Instructions (Signed)
Medication Instructions:  Your physician recommends that you continue on your current medications as directed. Please refer to the Current Medication list given to you today.   Labwork: none  Testing/Procedures: Your physician has requested that you have a lower extremity arterial exercise duplex. During this test, exercise and ultrasound are used to evaluate arterial blood flow in the legs. Allow one hour for this exam. There are no restrictions or special instructions.  Your physician has requested that you have an alorta iliac duplex.  Follow-Up: Your physician recommends that you schedule a follow-up appointment in: one month.    Any Other Special Instructions Will Be Listed Below (If Applicable).     If you need a refill on your cardiac medications before your next appointment, please call your pharmacy.

## 2016-01-27 NOTE — Progress Notes (Signed)
Cardiology Office Note   Date:  01/27/2016   ID:  [name], DOB [birth year], MRN [id number]  PCP:  Crecencio Mc, MD  Cardiologist:   Kathlyn Sacramento, MD   Chief Complaint  Patient presents with  . Other    Ref by Dr. Bjorn Loser office for eval. for PAD/iliac artery.  Meds reviewed by the patient verbally.       History of Present Illness: [name] is a 70 y.o. male who was referred by Dr. Jacqlyn Larsen for evaluation of left iliac artery disease and possible dissection. The patient has no previous cardiac history but has multiple chronic medical conditions that include hypertension, chronic kidney disease, kidney cancer status post partial nephrectomy, borderline diabetes currently diet controlled and prolonged history of tobacco use. He had recent back surgery at [facility] with significant improvement in back and leg pain. He follows with urology in a regular basis and recently he had routine MRI for follow-up. It showed incidental finding of long dissection of the left common iliac artery versus eccentric plaque with penetrating ulcer. The patient had a stress test done in July at [facility] for preoperative cardiovascular evaluation. He underwent a stress echo on it was normal. He currently has no clear claudication although it's difficult to distinguish that given his residual pain from his recent back surgery. He denies any chest pain.     Past Medical History:  Diagnosis Date  . 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 Hyperlipidemia   . Hypertension   . Migraine    cluster  . Obstructive sleep apnea   . PAD (peripheral artery disease) [facility]) Feb 2009   nonobstructing, renal angiogram ([facility]as)  . Renal cell carcinoma 2004   left kidney heminephrectomy  . TIA (transient ischemic attack) Aug 2013  . tobacco abuse     Past Surgical History:  Procedure Laterality Date  . CARDIAC CATHETERIZATION     Dr. [name] [facility])   .  heminephrectomy  2004   for renal cell CA  . RENAL CRYOABLATION  Jan 2006   right kidney,  [name]     Current Outpatient Prescriptions  Medication Sig Dispense Refill  . amLODipine (NORVASC) 10 MG tablet Take 1 tablet (10 mg total) by mouth daily. 90 tablet 3  . aspirin 81 MG tablet Take 1 tablet (81 mg total) by mouth daily. 30 tablet 11  . irbesartan (AVAPRO) 300 MG tablet Take 300 mg by mouth daily.     No current facility-administered medications for this visit.     Allergies:   Atorvastatin    Social History:  The patient  reports that he has been smoking Cigarettes.  He has been smoking about 1.50 packs per day. He has never used smokeless tobacco. He reports that he drinks alcohol. He reports that he does not use drugs.   Family History:  The patient's family history includes Aneurysm in his maternal grandmother and paternal grandmother; Cancer in his mother; Coronary artery disease in his father and paternal grandfather; Hypertension in his father and mother.    ROS:  Please see the history of present illness.   Otherwise, review of systems are positive for none.   All other systems are reviewed and negative.    PHYSICAL EXAM: VS:  BP (!) 184/80 (BP Location: Right Arm, Patient Position: Sitting, Cuff Size: Normal)   Pulse 78   Ht 5\' 6"  (1.676 m)   Wt 160 lb 8  oz (72.8 kg)   BMI 25.91 kg/m  , BMI Body mass index is 25.91 kg/m. GEN: Well nourished, well developed, in no acute distress  HEENT: normal  Neck: no JVD, carotid bruits, or masses Cardiac: RRR; no murmurs, rubs, or gallops,no edema  Respiratory:  clear to auscultation bilaterally, normal work of breathing GI: soft, nontender, nondistended, + BS MS: no deformity or atrophy  Skin: warm and dry, no rash Neuro:  Strength and sensation are intact Psych: euthymic mood, full affect Vascular: Femoral pulses are +2 bilaterally. Posterior tibial is +2 bilaterally. Dorsalis pedis: +2 on the right and +1 on the left.  He has bilateral femoral artery bruits.  EKG:  EKG is ordered today. The ekg ordered today demonstrates normal sinus rhythm with borderline LVH criteria.   Recent Labs: 07/18/2015: ALT 8; BUN 27; Creat 1.92; Potassium 4.4; Sodium 137    Lipid Panel    Component Value Date/Time   CHOL 176 12/02/2014 0839   CHOL 159 01/04/2012 0428   TRIG 222.0 (H) 12/02/2014 0839   TRIG 218 (H) 01/04/2012 0428   HDL 26.20 (L) 12/02/2014 0839   HDL 17 (L) 01/04/2012 0428   CHOLHDL 7 12/02/2014 0839   VLDL 44.4 (H) 12/02/2014 0839   VLDL 44 (H) 01/04/2012 0428   LDLCALC 123 (H) 11/08/2013 0838   LDLCALC 125 (H) 11/08/2013 0838   LDLCALC 98 01/04/2012 0428   LDLDIRECT 128 07/18/2015 1549      Wt Readings from Last 3 Encounters:  01/27/16 160 lb 8 oz (72.8 kg)  07/18/15 166 lb 8 oz (75.5 kg)  12/02/14 164 lb 12.8 oz (74.8 kg)      Other studies Reviewed: Additional studies/ records that were reviewed today include:  MRI done at [facility]. Review of the above records demonstrates:  Eccentric plaque involving the left common iliac artery.  PAD Screen 01/27/2016  Previous PAD dx? Yes  Previous surgical procedure? No  Pain with walking? Yes  Subsides with rest? Yes  Feet/toe relief with dangling? No  Painful, non-healing ulcers? No  Extremities discolored? No      ASSESSMENT AND PLAN:  1.  Peripheral arterial disease: Recent MRI of the pelvis showed incidental finding of what seems to be eccentric left iliac artery plaque with possible penetrating ulcer and mild ectasia. The diameter of the left common iliac artery was 1.7 cm.  I personally don't think there is dissection in that area. The ideal test probably would be CT angiography but the patient has chronic kidney disease. Thus, I'm going to start with an aortoiliac duplex especially that he has bilateral common femoral artery bruits. His leg pain is atypical and I suspect that it's likely due to his back problems and not due to claudication.  We'll obtain lower extremity arterial Doppler.  2. Essential hypertension: Blood pressure is elevated. He is chronically hypertensive and he might need the addition of another medication. Given chronic kidney disease, the options are somewhat limited. We can consider adding a thiazide diuretic with monitoring of renal function.  3. Tobacco use: I had a prolonged discussion with him about the importance of smoking cessation.  4. Hyperlipidemia: We should consider treatment with a statin given #1.    Disposition:   FU with me in 1 month  Signed,  Kathlyn Sacramento, MD  01/27/2016 4:35 PM    [place]

## 2016-02-04 ENCOUNTER — Other Ambulatory Visit: Payer: Self-pay | Admitting: Cardiovascular Disease

## 2016-02-04 DIAGNOSIS — R0989 Other specified symptoms and signs involving the circulatory and respiratory systems: Secondary | ICD-10-CM

## 2016-02-04 DIAGNOSIS — M549 Dorsalgia, unspecified: Secondary | ICD-10-CM | POA: Diagnosis not present

## 2016-02-04 DIAGNOSIS — I739 Peripheral vascular disease, unspecified: Secondary | ICD-10-CM

## 2016-02-04 DIAGNOSIS — Z9889 Other specified postprocedural states: Secondary | ICD-10-CM | POA: Diagnosis not present

## 2016-02-06 ENCOUNTER — Other Ambulatory Visit: Payer: Self-pay | Admitting: Cardiovascular Disease

## 2016-02-06 DIAGNOSIS — R0989 Other specified symptoms and signs involving the circulatory and respiratory systems: Secondary | ICD-10-CM

## 2016-02-06 DIAGNOSIS — I739 Peripheral vascular disease, unspecified: Secondary | ICD-10-CM

## 2016-02-11 ENCOUNTER — Ambulatory Visit: Payer: Medicare Other

## 2016-02-11 DIAGNOSIS — I739 Peripheral vascular disease, unspecified: Secondary | ICD-10-CM

## 2016-02-11 DIAGNOSIS — R0989 Other specified symptoms and signs involving the circulatory and respiratory systems: Secondary | ICD-10-CM

## 2016-04-07 ENCOUNTER — Ambulatory Visit (INDEPENDENT_AMBULATORY_CARE_PROVIDER_SITE_OTHER): Payer: Medicare Other

## 2016-04-07 VITALS — BP 158/80 | HR 68 | Temp 97.7°F | Resp 14 | Ht 66.0 in | Wt 159.0 lb

## 2016-04-07 DIAGNOSIS — Z0000 Encounter for general adult medical examination without abnormal findings: Secondary | ICD-10-CM | POA: Diagnosis not present

## 2016-04-07 DIAGNOSIS — H833X3 Noise effects on inner ear, bilateral: Secondary | ICD-10-CM

## 2016-04-07 NOTE — Patient Instructions (Addendum)
[name] , Thank you for taking time to come for your Medicare Wellness Visit. I appreciate your ongoing commitment to your health goals. Please review the following plan we discussed and let me know if I can assist you in the future.   OVERDUE FOR 6 MONTH FOLLOW UP APPOINTMENT  MAKE A FOLLOW UP APPOINTMENT WITH DR. Derrel Nip  AUDIOLOGY TESTING AS DIRECTED  These are the goals we discussed: Goals    . Increase physical activity          Go to the gym and work on strength building exercises       This is a list of the screening recommended for you and due dates:  Health Maintenance  Topic Date Due  .  Hepatitis C: One time screening is recommended by Center for Disease Control  (CDC) for  adults born from [birth year] through 1965.   1945/07/24  . Shingles Vaccine  11/04/2005  . Eye exam for diabetics  09/09/2014  . Hemoglobin A1C  01/15/2016  . Flu Shot  08/20/2016*  . Colon Cancer Screening  05/24/2016  . Complete foot exam   07/17/2016  . Tetanus Vaccine  02/04/2019  . Pneumonia vaccines  Completed  *Topic was postponed. The date shown is not the original due date.    Hearing Loss Introduction Hearing loss is a partial or total loss of the ability to hear. This can be temporary or permanent, and it can happen in one or both ears. Hearing loss may be referred to as deafness. Medical care is necessary to treat hearing loss properly and to prevent the condition from getting worse. Your hearing may partially or completely come back, depending on what caused your hearing loss and how severe it is. In some cases, hearing loss is permanent. What are the causes? Common causes of hearing loss include:  Too much wax in the ear canal.  Infection of the ear canal or middle ear.  Fluid in the middle ear.  Injury to the ear or surrounding area.  An object stuck in the ear.  Prolonged exposure to loud sounds, such as music. Less common causes of hearing loss include:  Tumors in the  ear.  Viral or bacterial infections, such as meningitis.  A hole in the eardrum (perforated eardrum).  Problems with the hearing nerve that sends signals between the brain and the ear.  Certain medicines. What are the signs or symptoms? Symptoms of this condition may include:  Difficulty telling the difference between sounds.  Difficulty following a conversation when there is background noise.  Lack of response to sounds in your environment. This may be most noticeable when you do not respond to startling sounds.  Needing to turn up the volume on the television, radio, etc.  Ringing in the ears.  Dizziness.  Pain in the ears. How is this diagnosed? This condition is diagnosed based on a physical exam and a hearing test (audiometry). The audiometry test will be performed by a hearing specialist (audiologist). You may also be referred to an ear, nose, and throat (ENT) specialist (otolaryngologist). How is this treated? Treatment for recent onset of hearing loss may include:  Ear wax removal.  Being prescribed medicines to prevent infection (antibiotics).  Being prescribed medicines to reduce inflammation (corticosteroids). Follow these instructions at home:  If you were prescribed an antibiotic medicine, take it as told by your health care provider. Do not stop taking the antibiotic even if you start to feel better.  Take over-the-counter and  prescription medicines only as told by your health care provider.  Avoid loud noises.  Return to your normal activities as told by your health care provider. Ask your health care provider what activities are safe for you.  Keep all follow-up visits as told by your health care provider. This is important. Contact a health care provider if:  You feel dizzy.  You develop new symptoms.  You vomit or feel nauseous.  You have a fever. Get help right away if:  You develop sudden changes in your vision.  You have severe ear  pain.  You have new or increased weakness.  You have a severe headache. This information is not intended to replace advice given to you by your health care provider. Make sure you discuss any questions you have with your health care provider. Document Released: 05/10/2005 Document Revised: 10/16/2015 Document Reviewed: 09/25/2014  2017 Elsevier     Steps to Quit Smoking Smoking tobacco can be bad for your health. It can also affect almost every organ in your body. Smoking puts you and people around you at risk for many serious long-lasting (chronic) diseases. Quitting smoking is hard, but it is one of the best things that you can do for your health. It is never too late to quit. What are the benefits of quitting smoking? When you quit smoking, you lower your risk for getting serious diseases and conditions. They can include:  Lung cancer or lung disease.  Heart disease.  Stroke.  Heart attack.  Not being able to have children (infertility).  Weak bones (osteoporosis) and broken bones (fractures). If you have coughing, wheezing, and shortness of breath, those symptoms may get better when you quit. You may also get sick less often. If you are pregnant, quitting smoking can help to lower your chances of having a baby of low birth weight. What can I do to help me quit smoking? Talk with your doctor about what can help you quit smoking. Some things you can do (strategies) include:  Quitting smoking totally, instead of slowly cutting back how much you smoke over a period of time.  Going to in-person counseling. You are more likely to quit if you go to many counseling sessions.  Using resources and support systems, such as:  Online chats with a Social worker.  Phone quitlines.  Printed Furniture conservator/restorer.  Support groups or group counseling.  Text messaging programs.  Mobile phone apps or applications.  Taking medicines. Some of these medicines may have nicotine in them. If  you are pregnant or breastfeeding, do not take any medicines to quit smoking unless your doctor says it is okay. Talk with your doctor about counseling or other things that can help you. Talk with your doctor about using more than one strategy at the same time, such as taking medicines while you are also going to in-person counseling. This can help make quitting easier. What things can I do to make it easier to quit? Quitting smoking might feel very hard at first, but there is a lot that you can do to make it easier. Take these steps:  Talk to your family and friends. Ask them to support and encourage you.  Call phone quitlines, reach out to support groups, or work with a Social worker.  Ask people who smoke to not smoke around you.  Avoid places that make you want (trigger) to smoke, such as:  Bars.  Parties.  Smoke-break areas at work.  Spend time with people who do not smoke.  Lower the stress in your life. Stress can make you want to smoke. Try these things to help your stress:  Getting regular exercise.  Deep-breathing exercises.  Yoga.  Meditating.  Doing a body scan. To do this, close your eyes, focus on one area of your body at a time from head to toe, and notice which parts of your body are tense. Try to relax the muscles in those areas.  Download or buy apps on your mobile phone or tablet that can help you stick to your quit plan. There are many free apps, such as QuitGuide from the [employer] Office manager for Disease Control and Prevention). You can find more support from smokefree.gov and other websites. This information is not intended to replace advice given to you by your health care provider. Make sure you discuss any questions you have with your health care provider. Document Released: 03/06/2009 Document Revised: 01/06/2016 Document Reviewed: 09/24/2014 Elsevier Interactive Patient Education  2017 [employer].

## 2016-04-07 NOTE — Progress Notes (Signed)
Subjective:   MARCELLE BEBOUT is a 70 y.o. male who presents for an Initial Medicare Annual Wellness Visit.  Review of Systems  No ROS.  Medicare Wellness Visit.  Cardiac Risk Factors include: advanced age (>94men, >21 women);male gender;hypertension;diabetes mellitus    Objective:    Today's Vitals   04/07/16 0805  BP: (!) 158/80  Pulse: 68  Resp: 14  Temp: 97.7 F (36.5 C)  TempSrc: Oral  SpO2: 98%  Weight: 159 lb (72.1 kg)  Height: 5\' 6"  (1.676 m)   Body mass index is 25.66 kg/m.  Current Medications (verified) Outpatient Encounter Prescriptions as of 04/07/2016  Medication Sig  . amLODipine (NORVASC) 10 MG tablet Take 1 tablet (10 mg total) by mouth daily.  Marland Kitchen aspirin 81 MG tablet Take 1 tablet (81 mg total) by mouth daily.  . irbesartan (AVAPRO) 300 MG tablet Take 300 mg by mouth daily.   No facility-administered encounter medications on file as of 04/07/2016.     Allergies (verified) Atorvastatin   History: Past Medical History:  Diagnosis Date  . 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 Hyperlipidemia   . Hypertension   . Migraine    cluster  . Obstructive sleep apnea   . PAD (peripheral artery disease) [facility]) Feb 2009   nonobstructing, renal angiogram ([place])  . Renal cell carcinoma 2004   left kidney heminephrectomy  . TIA (transient ischemic attack) Aug 2013  . tobacco abuse    Past Surgical History:  Procedure Laterality Date  . CARDIAC CATHETERIZATION     Dr. [name] [facility])   . heminephrectomy  2004   for renal cell CA  . RENAL CRYOABLATION  Jan 2006   right kidney,  [name]  . sciatica     Family History  Problem Relation Age of Onset  . Hypertension Mother   . Cancer Mother     breast  . Coronary artery disease Father   . Hypertension Father   . Aneurysm Maternal Grandmother     brain  . Aneurysm Paternal Grandmother     brain  . Coronary artery disease Paternal Grandfather     Social History   Occupational History  . Not on file.   Social History Main Topics  . Smoking status: Current Every Day Smoker    Packs/day: 1.50    Types: Cigarettes  . Smokeless tobacco: Never Used  . Alcohol use Yes  . Drug use: No  . Sexual activity: Yes   Tobacco Counseling Ready to quit: Not Answered Counseling given: Not Answered   Activities of Daily Living In your present state of health, do you have any difficulty performing the following activities: 04/07/2016  Hearing? Y  Vision? N  Difficulty concentrating or making decisions? N  Walking or climbing stairs? N  Dressing or bathing? N  Doing errands, shopping? N  Preparing Food and eating ? N  Using the Toilet? N  In the past six months, have you accidently leaked urine? N  Do you have problems with loss of bowel control? N  Managing your Medications? N  Managing your Finances? N  Housekeeping or managing your Housekeeping? N  Some recent data might be hidden    Immunizations and Health Maintenance Immunization History  Administered Date(s) Administered  . Pneumococcal Conjugate-13 02/15/2014  . Pneumococcal Polysaccharide-23 01/23/2013  . Tdap 02/03/2009   Health Maintenance Due  Topic Date Due  . Hepatitis C Screening  March 30, 1946  . ZOSTAVAX  11/04/2005  . OPHTHALMOLOGY EXAM  09/09/2014  . HEMOGLOBIN A1C  01/15/2016    Patient Care Team: Crecencio Mc, MD as PCP - General (Internal Medicine)  Indicate any recent Medical Services you may have received from other than [facility] providers in the past year (date may be approximate).    Assessment:   This is a routine wellness examination for [place].  The goal of the wellness visit is to assist the patient how to close the gaps in care and create a preventative care plan for the patient.   Osteoporosis risk reviewed.  Medications reviewed; taking without issues or barriers.  Safety issues reviewed; smoke detectors in the home. Firearms  locked in a safe within the home. Wears seatbelts when driving or riding with others. No violence in the home.  No identified risk were noted; The patient was oriented x 3; appropriate in dress and manner and no objective failures at ADL's or IADL's.   BMI; discussed the importance of a healthy diet, water intake and exercise. Educational material provided.  HTN; managed with medication.  Elevated today. Encouraged to take medication as directed.  Follow up scheduled with PCP.  Tobacco; current everyday smoker.  Not ready to quit smoking but will try to smoke less.  Educational material provided.  Hepatitis C screening; discussed.  Screening deferred for follow up with PCP per request. Educational material provided.    ZOSTAVAX vaccine deferred for follow up with insurance.  Follow up with PCP.  Patient Concerns: None at this time. Follow up with PCP as needed.  Hearing/Vision screen Hearing Screening Comments: Difficulty hearing a whisper/tones Audiologic testing referral placed Vision Screening Comments: Followed by [facility] Does not wearing glasses Last OV 2015 Encouraged to make an eye exam Vision screening deferred per patient request No retinopathy reported  Dietary issues and exercise activities discussed: Current Exercise Habits: Home exercise routine (Yard work), Type of exercise: walking, Time (Minutes): 20, Frequency (Times/Week): 4, Weekly Exercise (Minutes/Week): 80, Intensity: Mild  Goals    . Increase physical activity          Go to the gym and work on strength building exercises      Depression Screen PHQ 2/9 Scores 04/07/2016 08/22/2014 05/23/2012  PHQ - 2 Score 0 0 0    Fall Risk Fall Risk  04/07/2016 08/22/2014 05/23/2012  Falls in the past year? No No -  Number falls in past yr: - - 1    Cognitive Function:     6CIT Screen 04/07/2016  What Year? 0 points  What month? 0 points  What time? 0 points  Count back from 20 0 points   Months in reverse 0 points    Screening Tests Health Maintenance  Topic Date Due  . Hepatitis C Screening  1945-11-27  . ZOSTAVAX  11/04/2005  . OPHTHALMOLOGY EXAM  09/09/2014  . HEMOGLOBIN A1C  01/15/2016  . INFLUENZA VACCINE  08/20/2016 (Originally 12/23/2015)  . COLONOSCOPY  05/24/2016  . FOOT EXAM  07/17/2016  . TETANUS/TDAP  02/04/2019  . PNA vac Low Risk Adult  Completed        Plan:    End of life planning; Advance aging; Advanced directives discussed. Copy of current HCPOA/Living Will requested.  FOLLOW UP APPOINTMENT WITH DR. Derrel Nip  AUDIOLOGY TESTING AS DIRECTED  Medicare Attestation I have personally reviewed: The patient's medical and social history Their use of alcohol, tobacco or illicit drugs Their current medications and supplements The patient's functional ability including ADLs,fall  risks, home safety risks, cognitive, and hearing and visual impairment Diet and physical activities Evidence for depression   The patient's weight, height, BMI, and visual acuity have been recorded in the chart.  I have made referrals and provided education to the patient based on review of the above and I have provided the patient with a written personalized care plan for preventive services.    During the course of the visit [place] was educated and counseled about the following appropriate screening and preventive services:   Vaccines to include Pneumoccal, Influenza, Hepatitis B, Td, Zostavax, HCV  Electrocardiogram  Colorectal cancer screening  Cardiovascular disease screening  Diabetes screening  Glaucoma screening  Nutrition counseling  Prostate cancer screening  Smoking cessation counseling  Patient Instructions (the written plan) were given to the patient.   Varney Biles, LPN   [id number]

## 2016-04-11 NOTE — Progress Notes (Signed)
  I have reviewed the above information and agree with above.   Evangela Heffler, MD 

## 2016-04-12 ENCOUNTER — Encounter: Payer: Self-pay | Admitting: Internal Medicine

## 2016-04-23 DIAGNOSIS — H903 Sensorineural hearing loss, bilateral: Secondary | ICD-10-CM | POA: Diagnosis not present

## 2016-04-23 DIAGNOSIS — H6123 Impacted cerumen, bilateral: Secondary | ICD-10-CM | POA: Diagnosis not present

## 2016-05-06 DIAGNOSIS — R972 Elevated prostate specific antigen [PSA]: Secondary | ICD-10-CM | POA: Diagnosis not present

## 2016-05-11 ENCOUNTER — Encounter: Payer: Self-pay | Admitting: Internal Medicine

## 2016-05-11 ENCOUNTER — Ambulatory Visit (INDEPENDENT_AMBULATORY_CARE_PROVIDER_SITE_OTHER): Payer: Medicare Other | Admitting: Internal Medicine

## 2016-05-11 VITALS — BP 182/90 | HR 74 | Temp 98.6°F | Resp 16 | Ht 66.0 in | Wt 161.2 lb

## 2016-05-11 DIAGNOSIS — E7800 Pure hypercholesterolemia, unspecified: Secondary | ICD-10-CM | POA: Diagnosis not present

## 2016-05-11 DIAGNOSIS — N183 Chronic kidney disease, stage 3 unspecified: Secondary | ICD-10-CM

## 2016-05-11 DIAGNOSIS — Z716 Tobacco abuse counseling: Secondary | ICD-10-CM

## 2016-05-11 DIAGNOSIS — J209 Acute bronchitis, unspecified: Secondary | ICD-10-CM

## 2016-05-11 DIAGNOSIS — I129 Hypertensive chronic kidney disease with stage 1 through stage 4 chronic kidney disease, or unspecified chronic kidney disease: Secondary | ICD-10-CM

## 2016-05-11 DIAGNOSIS — J441 Chronic obstructive pulmonary disease with (acute) exacerbation: Secondary | ICD-10-CM | POA: Diagnosis not present

## 2016-05-11 DIAGNOSIS — E1121 Type 2 diabetes mellitus with diabetic nephropathy: Secondary | ICD-10-CM

## 2016-05-11 DIAGNOSIS — E118 Type 2 diabetes mellitus with unspecified complications: Secondary | ICD-10-CM

## 2016-05-11 MED ORDER — LEVOFLOXACIN 500 MG PO TABS: 500.0000 mg | ORAL_TABLET | Freq: Every day | ORAL | 0 refills | Status: DC

## 2016-05-11 MED ORDER — PREDNISONE 10 MG PO TABS: ORAL_TABLET | ORAL | 0 refills | Status: DC

## 2016-05-11 MED ORDER — TRAMADOL HCL 50 MG PO TABS: 50.0000 mg | ORAL_TABLET | Freq: Four times a day (QID) | ORAL | 3 refills | Status: DC | PRN

## 2016-05-11 NOTE — Progress Notes (Signed)
Subjective:  Patient ID: [name], male    DOB: 04-05-1946  Age: 70 y.o. MRN: [id number]  CC: The primary encounter diagnosis was Pure hypercholesterolemia. Diagnoses of Kidney disease, chronic, stage III (GFR 30-59 ml/min), Controlled type 2 diabetes mellitus with complication, without long-term current use of insulin ([place]), COPD exacerbation ([place]), Renal sclerosis with hypertension, stage 1 through stage 4 or unspecified chronic kidney disease, Chronic kidney disease (CKD), stage III (moderate), Type 2 diabetes mellitus with diabetic nephropathy, without long-term current use of insulin ([place]), Tobacco abuse counseling, and Acute bronchitis, unspecified organism were also pertinent to this visit.  HPI [name] presents for annual exam but has not had follow up on type 2 DM and uncontrolled HTN( "Not sure why I'm here ")   Since Feb 2017  Had urology follow up last week for elevated PSA   bp uncontrolled today 180/100  In both arms.  Home bp measurements have been 062 systolic   Sinus congestion with drainage that has recently changed from  Clear to yellow less than a week ago,  no pain just fullness.  Productive sounding cough,  Still smoking    Lunch at 12:30 pork chop creamed potatoes   Still smoking 1.5 packs daily  HAS BEEN USING ibuprofen daily for post operative  sciatica right side , despite known history of CKD   Lab Results  Component Value Date   HGBA1C 5.9 05/11/2016        Outpatient Medications Prior to Visit  Medication Sig Dispense Refill  . amLODipine (NORVASC) 10 MG tablet Take 1 tablet (10 mg total) by mouth daily. 90 tablet 3  . aspirin 81 MG tablet Take 1 tablet (81 mg total) by mouth daily. 30 tablet 11  . irbesartan (AVAPRO) 300 MG tablet Take 300 mg by mouth daily.     No facility-administered medications prior to visit.     Review of Systems;  Patient denies headache, fevers, malaise, unintentional weight loss, skin rash, eye pain, sinus  congestion and sinus pain, sore throat, dysphagia,  hemoptysis , cough, dyspnea, wheezing, chest pain, palpitations, orthopnea, edema, abdominal pain, nausea, melena, diarrhea, constipation, flank pain, dysuria, hematuria, urinary  Frequency, nocturia, numbness, tingling, seizures,  Focal weakness, Loss of consciousness,  Tremor, insomnia, depression, anxiety, and suicidal ideation.      Objective:  BP (!) 182/90   Pulse 74   Temp 98.6 F (37 C) (Oral)   Resp 16   Ht 5\' 6"  (1.676 m)   Wt 161 lb 4 oz (73.1 kg)   SpO2 96%   BMI 26.03 kg/m   BP Readings from Last 3 Encounters:  05/11/16 (!) 182/90  04/07/16 (!) 158/80  01/27/16 (!) 184/80    Wt Readings from Last 3 Encounters:  05/11/16 161 lb 4 oz (73.1 kg)  04/07/16 159 lb (72.1 kg)  01/27/16 160 lb 8 oz (72.8 kg)    General appearance: alert, cooperative and appears stated age Ears: normal TM's and external ear canals both ears Throat: lips, mucosa, and tongue normal; teeth and gums normal Neck: no adenopathy, no carotid bruit, supple, symmetrical, trachea midline and thyroid not enlarged, symmetric, no tenderness/mass/nodules Back: symmetric, no curvature. ROM normal. No CVA tenderness. Lungs: bilateral ronchi with occasional wheezing , no egophony Heart: regular rate and rhythm, S1, S2 normal, no murmur, click, rub or gallop Abdomen: soft, non-tender; bowel sounds normal; no masses,  no organomegaly Pulses: 2+ and symmetric Skin: Skin color, texture, turgor normal. No rashes or  lesions Lymph nodes: Cervical, supraclavicular, and axillary nodes normal.  Lab Results  Component Value Date   HGBA1C 5.9 05/11/2016   HGBA1C 6.4 (H) 07/18/2015   HGBA1C 6.0 12/02/2014    Lab Results  Component Value Date   CREATININE 2.45 (H) 05/11/2016   CREATININE 1.92 (H) 07/18/2015   CREATININE 1.70 (H) 12/02/2014    Lab Results  Component Value Date   WBC 10.9 (H) 05/11/2016   HGB 12.1 (L) 05/11/2016   HCT 35.6 (L) 05/11/2016     PLT 280.0 05/11/2016   GLUCOSE 100 (H) 05/11/2016   CHOL 169 05/11/2016   TRIG 260.0 (H) 05/11/2016   HDL 25.20 (L) 05/11/2016   LDLDIRECT 115.0 05/11/2016   LDLCALC 123 (H) 11/08/2013   LDLCALC 125 (H) 11/08/2013   ALT 9 05/11/2016   AST 10 05/11/2016   NA 140 05/11/2016   K 4.9 05/11/2016   CL 107 05/11/2016   CREATININE 2.45 (H) 05/11/2016   BUN 28 (H) 05/11/2016   CO2 26 05/11/2016   TSH 3.95 11/08/2013   PSA 4.34 (H) 07/26/2013   INR 0.9 01/03/2012   HGBA1C 5.9 05/11/2016   MICROALBUR 63.9 (H) 05/11/2016     Assessment & Plan:   Problem List Items Addressed This Visit    Acute bronchitis    Vs COPD exacerbation given smoking history.  Empiric antibiotis, prednisone taper  Probiotics and cough suppressants.       Chronic kidney disease (CKD), stage III (moderate)    Worsening  Due to persistent  Noncompliance with advice to avoid NSAIDs       Controlled diabetes mellitus type 2 with complications (H[facility])   Relevant Orders   Hemoglobin A1c (Completed)   Microalbumin / creatinine urine ratio (Completed)   DM (diabetes mellitus), type 2 with renal complications ([facility])    Well-controlled on diet alone .  hemoglobin A1c has been consistently less than 7.0 . Patient is NOT  up-to-date on eye exams and foot exam was done today.  He has CKD , proteinuria and PAD . He is taking an ARB.  Fasting lipids have been reviewed but he is intolerant of  statin therapy .   Lab Results  Component Value Date   HGBA1C 5.9 05/11/2016   Lab Results  Component Value Date   MICROALBUR 63.9 (H) 05/11/2016   Lab Results  Component Value Date   CHOL 169 05/11/2016   HDL 25.20 (L) 05/11/2016   LDLCALC 123 (H) 11/08/2013   LDLCALC 125 (H) 11/08/2013   LDLDIRECT 115.0 05/11/2016   TRIG 260.0 (H) 05/11/2016   CHOLHDL 7 05/11/2016                       Hyperlipidemia - Primary    Risk of AMI in next 10 years is > 25% .  He is statin intolerant due to severe myalgias        Relevant Orders   LDL cholesterol, direct (Completed)   Lipid panel (Completed)   Hypertensive renal sclerosis with hypertension    He does not tolerate normotension due t o severe PAD>  No changes today  .lastct Lab Results  Component Value Date   NA 140 05/11/2016   K 4.9 05/11/2016   CL 107 05/11/2016   CO2 26 05/11/2016         Kidney disease, chronic, stage III (GFR 30-59 ml/min)    Stable by repeat Cr. He has been using NSAIDs due to severe pain .  advised him to stop  Lab Results  Component Value Date   CREATININE 2.45 (H) 05/11/2016    Lab Results  Component Value Date   NA 140 05/11/2016   K 4.9 05/11/2016   CL 107 05/11/2016   CO2 26 05/11/2016          Relevant Orders   Comprehensive metabolic panel (Completed)   Tobacco abuse counseling    He was counselled for the  Fourth  time and remains contemplative despite acknowledging the risks of recurrent cancer, lung cancer,. Strokes and CAD.        Other Visit Diagnoses    COPD exacerbation ([place])       Relevant Medications   diphenhydrAMINE (BENADRYL) 25 MG tablet   predniSONE (DELTASONE) 10 MG tablet   Other Relevant Orders   CBC with Differential/Platelet (Completed)      I am having Mr. Balles start on predniSONE, levofloxacin, and traMADol. I am also having him maintain his aspirin, amLODipine, irbesartan, and diphenhydrAMINE.  Meds ordered this encounter  Medications  . diphenhydrAMINE (BENADRYL) 25 MG tablet    Sig: Take 25 mg by mouth every 6 (six) hours as needed.  . predniSONE (DELTASONE) 10 MG tablet    Sig: 6 tablets all at once on Day 1 , then reduce by 1 tablet daily until gone    Dispense:  21 tablet    Refill:  0    PLEASE USE THE FREE TABLETS NOT THE PACK  . levofloxacin (LEVAQUIN) 500 MG tablet    Sig: Take 1 tablet (500 mg total) by mouth daily.    Dispense:  7 tablet    Refill:  0  . traMADol (ULTRAM) 50 MG tablet    Sig: Take 1 tablet (50 mg total) by mouth every 6  (six) hours as needed.    Dispense:  190 tablet    Refill:  3    There are no discontinued medications.  Follow-up: Return in about 6 months (around 11/09/2016).   Crecencio Mc, MD

## 2016-05-11 NOTE — Progress Notes (Signed)
Pre-visit discussion using our clinic review tool. No additional management support is needed unless otherwise documented below in the visit note.  

## 2016-05-11 NOTE — Patient Instructions (Addendum)
I need to see you at least twice a year (every 6 months) to manage your diabetes, every 3 months if your A1c is over 7.0 this time   I am treating you for a COPD exacerbation based on your lung exam today   I am treating you  with the following:  Prednisone;  In a  tapering dose for the next 6 days  (60 mg on Day 1 , .  50 mg on Day, 2 ,  40 mg on Day 3,  Etc)   Levaquin once daily  For   7 days (antibiotic)  Take Delsym for cough Benadryl 25 mg one hour before bedtime (for cough and post nasal drip)   NeilMed's sinus rinse can be used daily to flush sinuses (use sterile or bottled water ,  Not tap)    Please take a probiotic ( Align, Floraque or Culturelle), or  the generic version of one of these  For a minimum of 3 weeks to prevent a serious antibiotic associated diarrhea  Called clostridium dificile colitis  .  The alternative is to eat a serving of yogurt daily with live cultures.

## 2016-05-12 DIAGNOSIS — J209 Acute bronchitis, unspecified: Secondary | ICD-10-CM | POA: Insufficient documentation

## 2016-05-12 LAB — CBC WITH DIFFERENTIAL/PLATELET
Basophils Absolute: 0 10*3/uL (ref 0.0–0.1)
Basophils Relative: 0.2 % (ref 0.0–3.0)
Eosinophils Absolute: 0.8 10*3/uL — ABNORMAL HIGH (ref 0.0–0.7)
Eosinophils Relative: 7.1 % — ABNORMAL HIGH (ref 0.0–5.0)
HCT: 35.6 % — ABNORMAL LOW (ref 39.0–52.0)
Hemoglobin: 12.1 g/dL — ABNORMAL LOW (ref 13.0–17.0)
Lymphocytes Relative: 20.8 % (ref 12.0–46.0)
Lymphs Abs: 2.3 10*3/uL (ref 0.7–4.0)
MCHC: 34 g/dL (ref 30.0–36.0)
MCV: 86.8 fl (ref 78.0–100.0)
Monocytes Absolute: 0.7 10*3/uL (ref 0.1–1.0)
Monocytes Relative: 6.1 % (ref 3.0–12.0)
Neutro Abs: 7.2 10*3/uL (ref 1.4–7.7)
Neutrophils Relative %: 65.8 % (ref 43.0–77.0)
Platelets: 280 10*3/uL (ref 150.0–400.0)
RBC: 4.11 Mil/uL — ABNORMAL LOW (ref 4.22–5.81)
RDW: 13.1 % (ref 11.5–15.5)
WBC: 10.9 10*3/uL — ABNORMAL HIGH (ref 4.0–10.5)

## 2016-05-12 LAB — COMPREHENSIVE METABOLIC PANEL
ALT: 9 U/L (ref 0–53)
AST: 10 U/L (ref 0–37)
Albumin: 4 g/dL (ref 3.5–5.2)
Alkaline Phosphatase: 143 U/L — ABNORMAL HIGH (ref 39–117)
BUN: 28 mg/dL — ABNORMAL HIGH (ref 6–23)
CO2: 26 mEq/L (ref 19–32)
Calcium: 9 mg/dL (ref 8.4–10.5)
Chloride: 107 mEq/L (ref 96–112)
Creatinine, Ser: 2.45 mg/dL — ABNORMAL HIGH (ref 0.40–1.50)
GFR: 27.87 mL/min — ABNORMAL LOW (ref >60.00)
Glucose, Bld: 100 mg/dL — ABNORMAL HIGH (ref 70–99)
Potassium: 4.9 mEq/L (ref 3.5–5.1)
Sodium: 140 mEq/L (ref 135–145)
Total Bilirubin: 0.4 mg/dL (ref 0.2–1.2)
Total Protein: 6.8 g/dL (ref 6.0–8.3)

## 2016-05-12 LAB — LIPID PANEL
Cholesterol: 169 mg/dL (ref 0–200)
HDL: 25.2 mg/dL — ABNORMAL LOW (ref >39.00)
NonHDL: 144.2
Total CHOL/HDL Ratio: 7
Triglycerides: 260 mg/dL — ABNORMAL HIGH (ref 0.0–149.0)
VLDL: 52 mg/dL — ABNORMAL HIGH (ref 0.0–40.0)

## 2016-05-12 LAB — MICROALBUMIN / CREATININE URINE RATIO
Creatinine,U: 59 mg/dL
Microalb Creat Ratio: 108.4 mg/g — ABNORMAL HIGH (ref 0.0–30.0)
Microalb, Ur: 63.9 mg/dL — ABNORMAL HIGH (ref 0.0–1.9)

## 2016-05-12 LAB — HEMOGLOBIN A1C: Hgb A1c MFr Bld: 5.9 % (ref 4.6–6.5)

## 2016-05-12 LAB — LDL CHOLESTEROL, DIRECT: Direct LDL: 115 mg/dL

## 2016-05-12 NOTE — Assessment & Plan Note (Signed)
Worsening  Due to persistent  Noncompliance with advice to avoid NSAIDs

## 2016-05-12 NOTE — Assessment & Plan Note (Signed)
Stable by repeat Cr. He has been using NSAIDs due to severe pain .  advised him to stop  Lab Results  Component Value Date   CREATININE 2.45 (H) 05/11/2016    Lab Results  Component Value Date   NA 140 05/11/2016   K 4.9 05/11/2016   CL 107 05/11/2016   CO2 26 05/11/2016

## 2016-05-12 NOTE — Assessment & Plan Note (Signed)
Vs COPD exacerbation given smoking history.  Empiric antibiotis, prednisone taper  Probiotics and cough suppressants.

## 2016-05-12 NOTE — Assessment & Plan Note (Signed)
He does not tolerate normotension due t o severe PAD>  No changes today  .lastct Lab Results  Component Value Date   NA 140 05/11/2016   K 4.9 05/11/2016   CL 107 05/11/2016   CO2 26 05/11/2016

## 2016-05-12 NOTE — Assessment & Plan Note (Signed)
Risk of AMI in next 10 years is > 25% .  He is statin intolerant due to severe myalgias

## 2016-05-12 NOTE — Assessment & Plan Note (Signed)
Well-controlled on diet alone .  hemoglobin A1c has been consistently less than 7.0 . Patient is NOT  up-to-date on eye exams and foot exam was done today.  He has CKD , proteinuria and PAD . He is taking an ARB.  Fasting lipids have been reviewed but he is intolerant of  statin therapy .   Lab Results  Component Value Date   HGBA1C 5.9 05/11/2016   Lab Results  Component Value Date   MICROALBUR 63.9 (H) 05/11/2016   Lab Results  Component Value Date   CHOL 169 05/11/2016   HDL 25.20 (L) 05/11/2016   LDLCALC 123 (H) 11/08/2013   LDLCALC 125 (H) 11/08/2013   LDLDIRECT 115.0 05/11/2016   TRIG 260.0 (H) 05/11/2016   CHOLHDL 7 05/11/2016

## 2016-05-12 NOTE — Assessment & Plan Note (Signed)
He was counselled for the  Fourth  time and remains contemplative despite acknowledging the risks of recurrent cancer, lung cancer,. Strokes and CAD.

## 2016-05-13 ENCOUNTER — Telehealth: Payer: Self-pay | Admitting: *Deleted

## 2016-05-13 NOTE — Telephone Encounter (Signed)
Patient notified of labs.   

## 2016-05-13 NOTE — Telephone Encounter (Signed)
Patient requested lab results  Pt contact [phone]

## 2016-06-16 ENCOUNTER — Telehealth: Payer: Self-pay

## 2016-06-16 MED ORDER — IRBESARTAN 300 MG PO TABS: 300.0000 mg | ORAL_TABLET | Freq: Every day | ORAL | 5 refills | Status: DC

## 2016-06-16 NOTE — Telephone Encounter (Signed)
avapro sent to [facility]

## 2016-06-16 NOTE — Telephone Encounter (Signed)
lmom 

## 2016-06-16 NOTE — Telephone Encounter (Signed)
Insurance company sent a Rx therapy advisory notification. Spoke with patient, Patient was seen on May 11, 2016 for cold sx, and spoke with you about his medication Avapro 300 mg. Only his benadryl, antibiotic and prednisone, Tramadol was sent. Avapro was not sent during his Ov. Please advise

## 2016-06-23 DIAGNOSIS — D225 Melanocytic nevi of trunk: Secondary | ICD-10-CM | POA: Diagnosis not present

## 2016-06-23 DIAGNOSIS — B354 Tinea corporis: Secondary | ICD-10-CM | POA: Diagnosis not present

## 2016-06-23 DIAGNOSIS — Z1283 Encounter for screening for malignant neoplasm of skin: Secondary | ICD-10-CM | POA: Diagnosis not present

## 2016-06-23 DIAGNOSIS — Q809 Congenital ichthyosis, unspecified: Secondary | ICD-10-CM | POA: Diagnosis not present

## 2016-06-23 DIAGNOSIS — L578 Other skin changes due to chronic exposure to nonionizing radiation: Secondary | ICD-10-CM | POA: Diagnosis not present

## 2016-06-23 DIAGNOSIS — D229 Melanocytic nevi, unspecified: Secondary | ICD-10-CM | POA: Diagnosis not present

## 2016-06-23 DIAGNOSIS — Z85828 Personal history of other malignant neoplasm of skin: Secondary | ICD-10-CM | POA: Diagnosis not present

## 2016-06-23 DIAGNOSIS — D485 Neoplasm of uncertain behavior of skin: Secondary | ICD-10-CM | POA: Diagnosis not present

## 2016-06-23 DIAGNOSIS — L821 Other seborrheic keratosis: Secondary | ICD-10-CM | POA: Diagnosis not present

## 2016-06-23 DIAGNOSIS — D692 Other nonthrombocytopenic purpura: Secondary | ICD-10-CM | POA: Diagnosis not present

## 2016-06-23 DIAGNOSIS — L718 Other rosacea: Secondary | ICD-10-CM | POA: Diagnosis not present

## 2016-09-01 DIAGNOSIS — M79604 Pain in right leg: Secondary | ICD-10-CM | POA: Diagnosis not present

## 2016-09-01 DIAGNOSIS — M48061 Spinal stenosis, lumbar region without neurogenic claudication: Secondary | ICD-10-CM | POA: Diagnosis not present

## 2016-09-01 DIAGNOSIS — Z79899 Other long term (current) drug therapy: Secondary | ICD-10-CM | POA: Diagnosis not present

## 2016-09-01 DIAGNOSIS — Z9889 Other specified postprocedural states: Secondary | ICD-10-CM | POA: Diagnosis not present

## 2016-09-01 DIAGNOSIS — M545 Low back pain: Secondary | ICD-10-CM | POA: Diagnosis not present

## 2016-09-01 DIAGNOSIS — F17210 Nicotine dependence, cigarettes, uncomplicated: Secondary | ICD-10-CM | POA: Diagnosis not present

## 2016-09-10 DIAGNOSIS — M48061 Spinal stenosis, lumbar region without neurogenic claudication: Secondary | ICD-10-CM | POA: Diagnosis not present

## 2016-10-02 ENCOUNTER — Other Ambulatory Visit: Payer: Self-pay | Admitting: Internal Medicine

## 2016-11-08 DIAGNOSIS — C61 Malignant neoplasm of prostate: Secondary | ICD-10-CM | POA: Diagnosis not present

## 2016-11-09 ENCOUNTER — Encounter: Payer: Self-pay | Admitting: Internal Medicine

## 2016-11-09 ENCOUNTER — Ambulatory Visit (INDEPENDENT_AMBULATORY_CARE_PROVIDER_SITE_OTHER): Payer: Medicare Other

## 2016-11-09 ENCOUNTER — Ambulatory Visit (INDEPENDENT_AMBULATORY_CARE_PROVIDER_SITE_OTHER): Payer: Medicare Other | Admitting: Internal Medicine

## 2016-11-09 VITALS — BP 158/76 | HR 78 | Temp 97.9°F | Resp 16 | Ht 66.0 in | Wt 160.0 lb

## 2016-11-09 DIAGNOSIS — E785 Hyperlipidemia, unspecified: Secondary | ICD-10-CM | POA: Diagnosis not present

## 2016-11-09 DIAGNOSIS — R05 Cough: Secondary | ICD-10-CM | POA: Diagnosis not present

## 2016-11-09 DIAGNOSIS — N183 Chronic kidney disease, stage 3 unspecified: Secondary | ICD-10-CM

## 2016-11-09 DIAGNOSIS — J209 Acute bronchitis, unspecified: Secondary | ICD-10-CM | POA: Diagnosis not present

## 2016-11-09 DIAGNOSIS — M5431 Sciatica, right side: Secondary | ICD-10-CM

## 2016-11-09 DIAGNOSIS — R059 Cough, unspecified: Secondary | ICD-10-CM

## 2016-11-09 DIAGNOSIS — E1121 Type 2 diabetes mellitus with diabetic nephropathy: Secondary | ICD-10-CM | POA: Diagnosis not present

## 2016-11-09 DIAGNOSIS — I129 Hypertensive chronic kidney disease with stage 1 through stage 4 chronic kidney disease, or unspecified chronic kidney disease: Secondary | ICD-10-CM

## 2016-11-09 LAB — COMPREHENSIVE METABOLIC PANEL
ALT: 8 U/L (ref 0–53)
AST: 9 U/L (ref 0–37)
Albumin: 4 g/dL (ref 3.5–5.2)
Alkaline Phosphatase: 133 U/L — ABNORMAL HIGH (ref 39–117)
BUN: 29 mg/dL — ABNORMAL HIGH (ref 6–23)
CO2: 24 mEq/L (ref 19–32)
Calcium: 9.5 mg/dL (ref 8.4–10.5)
Chloride: 110 mEq/L (ref 96–112)
Creatinine, Ser: 3.03 mg/dL — ABNORMAL HIGH (ref 0.40–1.50)
GFR: 21.78 mL/min — ABNORMAL LOW (ref >60.00)
Glucose, Bld: 98 mg/dL (ref 70–99)
Potassium: 5.3 mEq/L — ABNORMAL HIGH (ref 3.5–5.1)
Sodium: 139 mEq/L (ref 135–145)
Total Bilirubin: 0.6 mg/dL (ref 0.2–1.2)
Total Protein: 6.8 g/dL (ref 6.0–8.3)

## 2016-11-09 LAB — LIPID PANEL
Cholesterol: 178 mg/dL (ref 0–200)
HDL: 24.5 mg/dL — ABNORMAL LOW (ref >39.00)
NonHDL: 153.73
Total CHOL/HDL Ratio: 7
Triglycerides: 243 mg/dL — ABNORMAL HIGH (ref 0.0–149.0)
VLDL: 48.6 mg/dL — ABNORMAL HIGH (ref 0.0–40.0)

## 2016-11-09 LAB — LDL CHOLESTEROL, DIRECT: Direct LDL: 110 mg/dL

## 2016-11-09 LAB — HEMOGLOBIN A1C: Hgb A1c MFr Bld: 6.1 % (ref 4.6–6.5)

## 2016-11-09 MED ORDER — TRAMADOL HCL 50 MG PO TABS: 50.0000 mg | ORAL_TABLET | Freq: Four times a day (QID) | ORAL | 5 refills | Status: DC | PRN

## 2016-11-09 MED ORDER — IRBESARTAN 300 MG PO TABS: 300.0000 mg | ORAL_TABLET | Freq: Every day | ORAL | 5 refills | Status: DC

## 2016-11-09 MED ORDER — GABAPENTIN 100 MG PO CAPS: 100.0000 mg | ORAL_CAPSULE | Freq: Every day | ORAL | 3 refills | Status: DC

## 2016-11-09 NOTE — Progress Notes (Signed)
Subjective:  Patient ID: [name], male    DOB: [birth year]  Age: 71 y.o. MRN: [id number]  CC: The primary encounter diagnosis was Type 2 diabetes mellitus with diabetic nephropathy, without long-term current use of insulin ([name]). Diagnoses of Cough, Acute bronchitis, unspecified organism, Chronic kidney disease (CKD), stage III (moderate), Sciatica of right side, and Renal sclerosis with hypertension, stage 1 through stage 4 or unspecified chronic kidney disease were also pertinent to this visit.  HPI [name] presents for follow up on hypertension , type 2 dm, right sided sciatica secondary to recurrent facet cyst. 6 months ago had laminectomy and pain resolved for several months. However pain returned    MRI was repeated showing that the cyst had returned  So surgery with steel rods was recommended.  The pain resolved 3 weeks prior to surgery so he cancelled the surgery . Currently having some pain managed with tramadol and goody's powders .  Was given gabapentin last week,  using only at night   Pain is located across his lower sacrum and muscle start tightening up down the back of both legs with walking fast Productive cough for several months,  Sputum is clear. Sneezing daily.  Cough brought on with supine position  Sinuses feel like they are constantly draining.  Takes benadryl  Last chest  Xray July 2017   Had prostate exam by [name] ,  Elevated PSA every 6 month check.    had ABIS last sept 2017 [name] office   Lab Results  Component Value Date   HGBA1C 6.1 11/09/2016   Lab Results  Component Value Date   CHOL 178 11/09/2016   HDL 24.50 (L) 11/09/2016   LDLCALC 123 (H) 11/08/2013   LDLCALC 125 (H) 11/08/2013   LDLDIRECT 110.0 11/09/2016   TRIG 243.0 (H) 11/09/2016   CHOLHDL 7 11/09/2016      Has not had lwer extremity circulation  Outpatient Medications Prior to Visit  Medication Sig Dispense Refill  . amLODipine (NORVASC) 10 MG tablet TAKE 1 TABLET BY MOUTH  DAILY 90 tablet 0  . aspirin 81 MG tablet Take 1 tablet (81 mg total) by mouth daily. 30 tablet 11  . diphenhydrAMINE (BENADRYL) 25 MG tablet Take 25 mg by mouth every 6 (six) hours as needed.    . irbesartan (AVAPRO) 300 MG tablet Take 1 tablet (300 mg total) by mouth daily. 30 tablet 5  . traMADol (ULTRAM) 50 MG tablet Take 1 tablet (50 mg total) by mouth every 6 (six) hours as needed. 190 tablet 3  . levofloxacin (LEVAQUIN) 500 MG tablet Take 1 tablet (500 mg total) by mouth daily. (Patient not taking: Reported on 11/09/2016) 7 tablet 0  . predniSONE (DELTASONE) 10 MG tablet 6 tablets all at once on Day 1 , then reduce by 1 tablet daily until gone (Patient not taking: Reported on 11/09/2016) 21 tablet 0   No facility-administered medications prior to visit.     Review of Systems;  Patient denies headache, fevers, malaise, unintentional weight loss, skin rash, eye pain, sinus congestion and sinus pain, sore throat, dysphagia,  hemoptysis , cough, dyspnea, wheezing, chest pain, palpitations, orthopnea, edema, abdominal pain, nausea, melena, diarrhea, constipation, flank pain, dysuria, hematuria, urinary  Frequency, nocturia, numbness, tingling, seizures,  Focal weakness, Loss of consciousness,  Tremor, insomnia, depression, anxiety, and suicidal ideation.      Objective:  BP (!) 158/76 (BP Location: Left Arm, Patient Position: Sitting, Cuff Size: Normal)   Pulse 78  Temp 97.9 F (36.6 C) (Oral)   Resp 16   Ht 5\' 6"  (1.676 m)   Wt 160 lb (72.6 kg)   SpO2 97%   BMI 25.82 kg/m   BP Readings from Last 3 Encounters:  11/09/16 (!) 158/76  05/11/16 (!) 182/90  04/07/16 (!) 158/80    Wt Readings from Last 3 Encounters:  11/09/16 160 lb (72.6 kg)  05/11/16 161 lb 4 oz (73.1 kg)  04/07/16 159 lb (72.1 kg)    General appearance: alert, cooperative and appears stated age Ears: normal TM's and external ear canals both ears Throat: lips, mucosa, and tongue normal; teeth and gums  normal Neck: no adenopathy, no carotid bruit, supple, symmetrical, trachea midline and thyroid not enlarged, symmetric, no tenderness/mass/nodules Back: symmetric, no curvature. ROM normal. No CVA tenderness. Lungs: clear to auscultation bilaterally Heart: regular rate and rhythm, S1, S2 normal, no murmur, click, rub or gallop Abdomen: soft, non-tender; bowel sounds normal; no masses,  no organomegaly Pulses: 2+ and symmetric Skin: Skin color, texture, turgor normal. No rashes or lesions Lymph nodes: Cervical, supraclavicular, and axillary nodes normal.  Lab Results  Component Value Date   HGBA1C 6.1 11/09/2016   HGBA1C 5.9 05/11/2016   HGBA1C 6.4 (H) 07/18/2015    Lab Results  Component Value Date   CREATININE 3.03 (H) 11/09/2016   CREATININE 2.45 (H) 05/11/2016   CREATININE 1.92 (H) 07/18/2015    Lab Results  Component Value Date   WBC 10.9 (H) 05/11/2016   HGB 12.1 (L) 05/11/2016   HCT 35.6 (L) 05/11/2016   PLT 280.0 05/11/2016   GLUCOSE 98 11/09/2016   CHOL 178 11/09/2016   TRIG 243.0 (H) 11/09/2016   HDL 24.50 (L) 11/09/2016   LDLDIRECT 110.0 11/09/2016   LDLCALC 123 (H) 11/08/2013   LDLCALC 125 (H) 11/08/2013   ALT 8 11/09/2016   AST 9 11/09/2016   NA 139 11/09/2016   K 5.3 (H) 11/09/2016   CL 110 11/09/2016   CREATININE 3.03 (H) 11/09/2016   BUN 29 (H) 11/09/2016   CO2 24 11/09/2016   TSH 3.95 11/08/2013   PSA 4.34 (H) 07/26/2013   INR 0.9 01/03/2012   HGBA1C 6.1 11/09/2016   MICROALBUR 63.9 (H) 05/11/2016      Assessment & Plan:   Problem List Items Addressed This Visit    Sciatica of right side    Aggravated by walking . Caused by recurrent facet cyst.  Continue tramadol       Relevant Medications   gabapentin (NEURONTIN) 100 MG capsule   Hypertensive renal sclerosis with hypertension    He does not tolerate normotension due t o severe PAD.   No changes today  .lastct Lab Results  Component Value Date   NA 139 11/09/2016   K 5.3 (H)  11/09/2016   CL 110 11/09/2016   CO2 24 11/09/2016         Relevant Medications   sildenafil (REVATIO) 20 MG tablet   irbesartan (AVAPRO) 300 MG tablet   DM (diabetes mellitus), type 2 with renal complications ([name]) - Primary    Well-controlled on diet alone .  hemoglobin A1c has been consistently less than 7.0 . Patient is NOT  up-to-date on eye exams and foot exam was done today.  He has CKD , proteinuria and PAD . He is taking an ARB.  Fasting lipids have been reviewed but he is intolerant of  statin therapy .   Lab Results  Component Value Date   HGBA1C 6.1  11/09/2016   Lab Results  Component Value Date   MICROALBUR 63.9 (H) 05/11/2016   Lab Results  Component Value Date   CHOL 178 11/09/2016   HDL 24.50 (L) 11/09/2016   LDLCALC 123 (H) 11/08/2013   LDLCALC 125 (H) 11/08/2013   LDLDIRECT 110.0 11/09/2016   TRIG 243.0 (H) 11/09/2016   CHOLHDL 7 11/09/2016                       Relevant Medications   irbesartan (AVAPRO) 300 MG tablet   Other Relevant Orders   Hemoglobin A1c (Completed)   Lipid panel (Completed)   Comprehensive metabolic panel (Completed)   Chronic kidney disease (CKD), stage III (moderate)    Hyperkalemia and decreased GFR noted on today's fasting labs.  Repeat assessment needed after hydration . Recommend benadryl at bedtime and zyrtec in the daytimes for persistent sinus drainage       RESOLVED: Acute bronchitis    Sputum is clear,  He is not wheezing on exam, and chest x ray is normal        Other Visit Diagnoses    Cough       Relevant Orders   DG Chest 2 View (Completed)    A total of 40 minutes was spent with patient more than half of which was spent in counseling patient on the above mentioned issues , reviewing and explaining recent labs and imaging studies done, and coordination of care.  I have discontinued [name] predniSONE and levofloxacin. I have also changed his gabapentin. Additionally, I am having him  maintain his aspirin, diphenhydrAMINE, amLODipine, sildenafil, traMADol, and irbesartan.  Meds ordered this encounter  Medications  . sildenafil (REVATIO) 20 MG tablet    Sig: 2-5 tablets as needed 1 hour prior to intercourse  . DISCONTD: gabapentin (NEURONTIN) 100 MG capsule    Sig: Take 100 mg by mouth at bedtime.  . traMADol (ULTRAM) 50 MG tablet    Sig: Take 1 tablet (50 mg total) by mouth every 6 (six) hours as needed.    Dispense:  120 tablet    Refill:  5  . irbesartan (AVAPRO) 300 MG tablet    Sig: Take 1 tablet (300 mg total) by mouth daily.    Dispense:  30 tablet    Refill:  5  . gabapentin (NEURONTIN) 100 MG capsule    Sig: Take 1 capsule (100 mg total) by mouth at bedtime.    Dispense:  90 capsule    Refill:  3    Medications Discontinued During This Encounter  Medication Reason  . levofloxacin (LEVAQUIN) 500 MG tablet Therapy completed  . predniSONE (DELTASONE) 10 MG tablet Therapy completed  . traMADol (ULTRAM) 50 MG tablet Reorder  . irbesartan (AVAPRO) 300 MG tablet Reorder  . gabapentin (NEURONTIN) 100 MG capsule Reorder    Follow-up: Return in about 6 months (around 05/11/2017).   Crecencio Mc, MD

## 2016-11-09 NOTE — Patient Instructions (Addendum)
For your allergies ,  You can use Benadryl at night but you should also consider adding one of these newer second generation antihistamines for daytime use  that are longer acting, non sedating and  available OTC:  Generic  Zyrtec, which is cetirizine.    generic Allegra , available generically as fexofenadine ; comes in 60 mg and 180 mg once daily strengths.    Generic Claritin :  also available as loratidine .   Chest x ray today to make sure there is no pneumonia   Your circulation appears to be fine, so your leg pain is coming from the cyst on your spine.  I have refilled your tramadol and gabapentin  . Continue to avoid Aleve and motrin

## 2016-11-11 ENCOUNTER — Encounter: Payer: Self-pay | Admitting: Internal Medicine

## 2016-11-11 ENCOUNTER — Other Ambulatory Visit: Payer: Self-pay | Admitting: Internal Medicine

## 2016-11-11 DIAGNOSIS — E875 Hyperkalemia: Secondary | ICD-10-CM

## 2016-11-11 NOTE — Assessment & Plan Note (Addendum)
Hyperkalemia and decreased GFR noted on today's fasting labs.  Repeat assessment needed after hydration . Recommend benadryl at bedtime and zyrtec in the daytimes for persistent sinus drainage

## 2016-11-11 NOTE — Assessment & Plan Note (Signed)
Aggravated by walking . Caused by recurrent facet cyst.  Continue tramadol

## 2016-11-11 NOTE — Assessment & Plan Note (Signed)
He does not tolerate normotension due t o severe PAD.   No changes today  .lastct Lab Results  Component Value Date   NA 139 11/09/2016   K 5.3 (H) 11/09/2016   CL 110 11/09/2016   CO2 24 11/09/2016

## 2016-11-11 NOTE — Assessment & Plan Note (Signed)
Well-controlled on diet alone .  hemoglobin A1c has been consistently less than 7.0 . Patient is NOT  up-to-date on eye exams and foot exam was done today.  He has CKD , proteinuria and PAD . He is taking an ARB.  Fasting lipids have been reviewed but he is intolerant of  statin therapy .   Lab Results  Component Value Date   HGBA1C 6.1 11/09/2016   Lab Results  Component Value Date   MICROALBUR 63.9 (H) 05/11/2016   Lab Results  Component Value Date   CHOL 178 11/09/2016   HDL 24.50 (L) 11/09/2016   LDLCALC 123 (H) 11/08/2013   LDLCALC 125 (H) 11/08/2013   LDLDIRECT 110.0 11/09/2016   TRIG 243.0 (H) 11/09/2016   CHOLHDL 7 11/09/2016

## 2016-11-11 NOTE — Progress Notes (Signed)
basic 

## 2016-11-11 NOTE — Assessment & Plan Note (Signed)
Sputum is clear,  He is not wheezing on exam, and chest x ray is normal

## 2016-11-17 ENCOUNTER — Other Ambulatory Visit: Payer: Self-pay | Admitting: Internal Medicine

## 2016-11-17 ENCOUNTER — Other Ambulatory Visit (INDEPENDENT_AMBULATORY_CARE_PROVIDER_SITE_OTHER): Payer: Medicare Other

## 2016-11-17 DIAGNOSIS — E875 Hyperkalemia: Secondary | ICD-10-CM | POA: Diagnosis not present

## 2016-11-17 DIAGNOSIS — I150 Renovascular hypertension: Secondary | ICD-10-CM

## 2016-11-17 LAB — BASIC METABOLIC PANEL
BUN: 30 mg/dL — ABNORMAL HIGH (ref 6–23)
CO2: 25 mEq/L (ref 19–32)
Calcium: 9.2 mg/dL (ref 8.4–10.5)
Chloride: 108 mEq/L (ref 96–112)
Creatinine, Ser: 3.27 mg/dL — ABNORMAL HIGH (ref 0.40–1.50)
GFR: 19.95 mL/min — ABNORMAL LOW (ref >60.00)
Glucose, Bld: 98 mg/dL (ref 70–99)
Potassium: 5.1 mEq/L (ref 3.5–5.1)
Sodium: 137 mEq/L (ref 135–145)

## 2016-11-17 MED ORDER — NEBIVOLOL HCL 5 MG PO TABS: 5.0000 mg | ORAL_TABLET | Freq: Every day | ORAL | 0 refills | Status: DC

## 2016-11-17 NOTE — Progress Notes (Signed)
enal ar

## 2016-11-25 ENCOUNTER — Encounter: Payer: Medicare Other | Admitting: *Deleted

## 2016-11-25 ENCOUNTER — Encounter: Payer: Self-pay | Admitting: Internal Medicine

## 2016-11-25 ENCOUNTER — Ambulatory Visit (INDEPENDENT_AMBULATORY_CARE_PROVIDER_SITE_OTHER): Payer: Medicare Other | Admitting: Internal Medicine

## 2016-11-25 ENCOUNTER — Other Ambulatory Visit (INDEPENDENT_AMBULATORY_CARE_PROVIDER_SITE_OTHER): Payer: Medicare Other

## 2016-11-25 VITALS — BP 186/80 | HR 59 | Temp 98.6°F | Resp 18 | Ht 66.0 in | Wt 159.2 lb

## 2016-11-25 DIAGNOSIS — I150 Renovascular hypertension: Secondary | ICD-10-CM

## 2016-11-25 DIAGNOSIS — I129 Hypertensive chronic kidney disease with stage 1 through stage 4 chronic kidney disease, or unspecified chronic kidney disease: Secondary | ICD-10-CM | POA: Diagnosis not present

## 2016-11-25 DIAGNOSIS — N184 Chronic kidney disease, stage 4 (severe): Secondary | ICD-10-CM

## 2016-11-25 DIAGNOSIS — R55 Syncope and collapse: Secondary | ICD-10-CM | POA: Diagnosis not present

## 2016-11-25 DIAGNOSIS — R0600 Dyspnea, unspecified: Secondary | ICD-10-CM | POA: Insufficient documentation

## 2016-11-25 DIAGNOSIS — R0602 Shortness of breath: Secondary | ICD-10-CM | POA: Diagnosis not present

## 2016-11-25 DIAGNOSIS — R0609 Other forms of dyspnea: Secondary | ICD-10-CM | POA: Insufficient documentation

## 2016-11-25 DIAGNOSIS — C649 Malignant neoplasm of unspecified kidney, except renal pelvis: Secondary | ICD-10-CM | POA: Diagnosis not present

## 2016-11-25 NOTE — Assessment & Plan Note (Signed)
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  Renal function recently worsened.  Discussed the need to avoid antiinflammatories.  Is scheduled for renal ultrasound.  Met b pending from today.

## 2016-11-25 NOTE — Assessment & Plan Note (Signed)
Renal function has declined.  avapro stopped recently.  Tolerating amlodipine.  Will avoid diuretics at this time.  Intolerance to bystolic.  Feels better today since stopping.  Discussed with nephrology.  Recommended restarting avapro and f/u with nephrology.  Keep appt for renal ultrasound.  Avoid antiinflammatories.

## 2016-11-25 NOTE — Progress Notes (Signed)
Patient came into office for nurse visit for BP check advised nurse that he had 2 episodes this week of near syncope since starting bystolic 5 mg with headache and feeling foggy. Took patient vitals and consulted with Dr. Nicki Reaper was advised she would see patient and attain ECG.

## 2016-11-25 NOTE — Progress Notes (Signed)
Patient ID: [name], male   DOB: 12/08/1945, 71 y.o.   MRN: [id number]   Subjective:    Patient ID: [name], male    DOB: 03-13-1946, 71 y.o.   MRN: [id number]  HPI  Patient here as a work in appt.  Was seen for a nurse visit today.  Blood pressure elevated.  Has had problems with what he feels is side effects to bystolic.  He reports that after latest labs reviewed, his avapro was stopped and he was started on bystolic (started 4/74/25).  States after being on the medication he began feeling more short of breath.  Also reports some minimal posterior head discomfort.  No significant headache.  No significant dizziness.  Reports that when he stands, he may feel a little light headed.  Head just did not feel the same.  Decreased energy.  States he just did not feel well.  Last dose of medication was yesterday morning.  States he feels better today since stopping.  Head feels better.  No increased sob currently.  No chest pain.  Eating and drinking.  No nausea or vomiting.  Bowels moving.  Occasional loose stool, but no significant diarrhea.  No abdominal pain.  Some cough.  Has had this for a while.  No syncope.     Past Medical History:  Diagnosis Date  . 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 Hyperlipidemia   . Hypertension   . Migraine    cluster  . Obstructive sleep apnea   . PAD (peripheral artery disease) [facility]) Feb 2009   nonobstructing, renal angiogram ([facility])  . Renal cell carcinoma 2004   left kidney heminephrectomy  . TIA (transient ischemic attack) Aug 2013  . tobacco abuse    Past Surgical History:  Procedure Laterality Date  . CARDIAC CATHETERIZATION     Dr. [name] [facility])   . cyst removal  12/25/2015   Spine L4 and L5  . heminephrectomy  2004   for renal cell CA  . RENAL CRYOABLATION  Jan 2006   right kidney,  [name]  . sciatica     Family History  Problem Relation Age of Onset  . Hypertension Mother     . Cancer Mother        breast  . Coronary artery disease Father   . Hypertension Father   . Aneurysm Maternal Grandmother        brain  . Aneurysm Paternal Grandmother        brain  . Coronary artery disease Paternal Grandfather    Social History   Social History  . Marital status: Married    Spouse name: N/A  . Number of children: N/A  . Years of education: N/A   Social History Main Topics  . Smoking status: Current Every Day Smoker    Packs/day: 1.50    Types: Cigarettes  . Smokeless tobacco: Never Used  . Alcohol use 1.2 oz/week    1 Cans of beer, 1 Shots of liquor per week     Comment: social  . Drug use: No  . Sexual activity: Yes   Other Topics Concern  . None   Social History Narrative  . None    Outpatient Encounter Prescriptions as of 11/25/2016  Medication Sig  . amLODipine (NORVASC) 10 MG tablet TAKE 1 TABLET BY MOUTH DAILY  . aspirin 81 MG tablet Take 1 tablet (81 mg total) by mouth daily.  . diphenhydrAMINE (BENADRYL)  25 MG tablet Take 25 mg by mouth every 6 (six) hours as needed.  . gabapentin (NEURONTIN) 100 MG capsule Take 1 capsule (100 mg total) by mouth at bedtime.  . irbesartan (AVAPRO) 300 MG tablet Take 1 tablet (300 mg total) by mouth daily.  . sildenafil (REVATIO) 20 MG tablet 2-5 tablets as needed 1 hour prior to intercourse  . traMADol (ULTRAM) 50 MG tablet Take 1 tablet (50 mg total) by mouth every 6 (six) hours as needed.  . nebivolol (BYSTOLIC) 5 MG tablet Take 1 tablet (5 mg total) by mouth daily. (Patient not taking: Reported on 11/25/2016)   No facility-administered encounter medications on file as of 11/25/2016.     Review of Systems  Constitutional: Negative for appetite change and unexpected weight change.  HENT: Negative for postnasal drip.        Some congestion, but states has improved since stopping the medication.    Respiratory: Positive for cough. Negative for chest tightness.        Some sob reported.  Improved today.     Cardiovascular: Negative for chest pain, palpitations and leg swelling.  Gastrointestinal: Negative for abdominal pain, nausea and vomiting.  Genitourinary: Negative for difficulty urinating and dysuria.  Musculoskeletal: Negative for back pain and joint swelling.  Skin: Negative for color change and rash.  Neurological:       Occasional light headedness as outlined.  No significant headache.    Psychiatric/Behavioral: Negative for agitation and dysphoric mood.       Objective:     Blood pressure rechecked by me:  176/82  Physical Exam  Constitutional: He appears well-developed and well-nourished. No distress.  HENT:  Nose: Nose normal.  Mouth/Throat: Oropharynx is clear and moist.  Neck: Neck supple. No thyromegaly present.  Cardiovascular: Normal rate and regular rhythm.   Pulmonary/Chest: Effort normal and breath sounds normal. No respiratory distress.  Abdominal: Soft. Bowel sounds are normal. There is no tenderness.  Musculoskeletal: He exhibits no edema or tenderness.  Lymphadenopathy:    He has no cervical adenopathy.  Skin: No rash noted. No erythema.  Psychiatric: He has a normal mood and affect. His behavior is normal.    BP (!) 186/80 (BP Location: Left Arm, Patient Position: Sitting, Cuff Size: Normal)   Pulse (!) 59   Temp 98.6 F (37 C) (Oral)   Resp 18   Ht 5' 6"  (1.676 m)   Wt 159 lb 4 oz (72.2 kg)   SpO2 98%   BMI 25.70 kg/m  Wt Readings from Last 3 Encounters:  11/25/16 159 lb 4 oz (72.2 kg)  11/09/16 160 lb (72.6 kg)  05/11/16 161 lb 4 oz (73.1 kg)    Lab Results  Component Value Date   WBC 10.9 (H) 05/11/2016   HGB 12.1 (L) 05/11/2016   HCT 35.6 (L) 05/11/2016   PLT 280.0 05/11/2016   GLUCOSE 98 11/17/2016   CHOL 178 11/09/2016   TRIG 243.0 (H) 11/09/2016   HDL 24.50 (L) 11/09/2016   LDLDIRECT 110.0 11/09/2016   LDLCALC 123 (H) 11/08/2013   LDLCALC 125 (H) 11/08/2013   ALT 8 11/09/2016   AST 9 11/09/2016   NA 137 11/17/2016   K 5.1  11/17/2016   CL 108 11/17/2016   CREATININE 3.27 (H) 11/17/2016   BUN 30 (H) 11/17/2016   CO2 25 11/17/2016   TSH 3.95 11/08/2013   PSA 4.34 (H) 07/26/2013   INR 0.9 01/03/2012   HGBA1C 6.1 11/09/2016   MICROALBUR 63.9 (H)  05/11/2016       Assessment & Plan:   Problem List Items Addressed This Visit    CKD (chronic kidney disease), stage IV ([place])    Renal function has declined.  avapro stopped recently.  Tolerating amlodipine.  Will avoid diuretics at this time.  Intolerance to bystolic.  Feels better today since stopping.  Discussed with nephrology.  Recommended restarting avapro and f/u with nephrology.  Keep appt for renal ultrasound.  Avoid antiinflammatories.        Hypertensive renal sclerosis with hypertension    Blood pressure elevated today with intolerance to bystolic.  He is taking amlodipine.  Stopped bystolic today.  Most recent GFR 19 (creatinine - 3.27).  Was recently taken off avapro.  Will avoid diuretics.  Discussed with nephrology.  Recommended restarting avapro and following pressures.  Discussed need for f/u with nephrology.  Has renal ultrasound scheduled.   Results of repeat metabolic panel pending.  Drawn today.        Renal cell carcinoma [facility])    He has a history of cystic carcinoma and is s/p partial nephrectomy (left).  Renal function recently worsened.  Discussed the need to avoid antiinflammatories.  Is scheduled for renal ultrasound.  Met b pending from today.        SOB (shortness of breath)    Symptoms as outlined since starting bystolic.  He relates his symptoms to starting bystolic.  Reported some increased sob. No chest pain.  Different sensation in his head.  Weakness and decreased energy.  EKG - SR with no acute ischemic changes.  Non specific changes noted.  States he did not feel these symptoms until started bystolic.  Discussed at length with him today.  He feels better today and feels more like his normal self. Worked today.  Will remain off  bystolic.  Discussed further w/up.  Will monitor closely.  If symptoms do not continue to improve/resolve, then further w/up warranted.  Pt in agreement and comfortable with this plan.         Other Visit Diagnoses    Syncope, near    -  Primary   Relevant Orders   EKG 12-Lead (Completed)       Einar Pheasant, MD

## 2016-11-25 NOTE — Assessment & Plan Note (Signed)
Symptoms as outlined since starting bystolic.  He relates his symptoms to starting bystolic.  Reported some increased sob. No chest pain.  Different sensation in his head.  Weakness and decreased energy.  EKG - SR with no acute ischemic changes.  Non specific changes noted.  States he did not feel these symptoms until started bystolic.  Discussed at length with him today.  He feels better today and feels more like his normal self. Worked today.  Will remain off bystolic.  Discussed further w/up.  Will monitor closely.  If symptoms do not continue to improve/resolve, then further w/up warranted.  Pt in agreement and comfortable with this plan.

## 2016-11-25 NOTE — Assessment & Plan Note (Signed)
Blood pressure elevated today with intolerance to bystolic.  He is taking amlodipine.  Stopped bystolic today.  Most recent GFR 19 (creatinine - 3.27).  Was recently taken off avapro.  Will avoid diuretics.  Discussed with nephrology.  Recommended restarting avapro and following pressures.  Discussed need for f/u with nephrology.  Has renal ultrasound scheduled.   Results of repeat metabolic panel pending.  Drawn today.

## 2016-11-26 ENCOUNTER — Telehealth: Payer: Self-pay | Admitting: Internal Medicine

## 2016-11-26 LAB — BASIC METABOLIC PANEL
BUN: 34 mg/dL — ABNORMAL HIGH (ref 6–23)
CO2: 24 mEq/L (ref 19–32)
Calcium: 9 mg/dL (ref 8.4–10.5)
Chloride: 109 mEq/L (ref 96–112)
Creatinine, Ser: 3.25 mg/dL — ABNORMAL HIGH (ref 0.40–1.50)
GFR: 20.09 mL/min — ABNORMAL LOW (ref >60.00)
Glucose, Bld: 88 mg/dL (ref 70–99)
Potassium: 4.7 mEq/L (ref 3.5–5.1)
Sodium: 139 mEq/L (ref 135–145)

## 2016-11-26 NOTE — Telephone Encounter (Signed)
Spoke with [name] and informed him of his lab results and that he needs to schedule an appt with Dr. Rolly Salter ASAP per Dr. Derrel Nip. The [name] stated that he would get an appt scheduled.

## 2016-11-26 NOTE — Telephone Encounter (Signed)
Spoke with pt this am.  He is doing better.  Please see my office note on him for details.  I did not schedule a f/u with nephrology.  He is back on avapro per recommendation.  I told him I would discuss with you.  Let me know if you need me to do anything more.

## 2016-11-26 NOTE — Telephone Encounter (Signed)
Thank you dr Nicki Reaper!  Jessica:  Please inform patient that a renal ultrasound does not take the place of seeing nephrology.  It is just an imaging study.   He needs to schedule an urgent appointment with Dr Rolly Salter because his kidney function has become worse over the last several weeks.   Yesterday's labs are still pending so I do no know if it was the irbesartan causing it or something else.    Did he start feeling bad when he started the bystolic for blood pressure?  What were his symptoms

## 2016-11-26 NOTE — Telephone Encounter (Signed)
Kidney function is unchanged sine stopping the irbesartan  So I agree with resuming it per Dr Hayes Ludwig please have him make appt with [facility] ASAP

## 2016-12-13 ENCOUNTER — Ambulatory Visit (INDEPENDENT_AMBULATORY_CARE_PROVIDER_SITE_OTHER): Payer: Medicare Other

## 2016-12-13 DIAGNOSIS — I150 Renovascular hypertension: Secondary | ICD-10-CM

## 2016-12-19 ENCOUNTER — Telehealth: Payer: Self-pay | Admitting: Internal Medicine

## 2016-12-19 NOTE — Telephone Encounter (Signed)
His renal artery doppler did not show a blockage , but it did show atherosclerosis.  He is not on any medication for atheoslcerosis Since he did not tolerate atorvastatin.  is he willing to try low dose Crestor?  If not, There is an alternative to statin therapy for management of hyperlipidemia.  It is called Repatha,  And it has a very low side effect profile because of its mechanism of action.  It is in a class of drugs called a PCSK9 inhibitor.   Repatha is a monoclonal human antibody that binds to the body's natural inhibitor of the LDL receptors (PCSK9) , preventing the inhibitor from inhibiting!  This effectively increases  the number of LDL receptors available to clear the bad cholesterol (LDL)  from the bloodstream.  It is injected into the skin (at home)  twice per month . The data of its efficacy is very good.  If you would be interested in trying it,  I will try to get it authorized   Let me know what he is willing to do

## 2016-12-20 NOTE — Telephone Encounter (Signed)
LMTCB

## 2016-12-21 NOTE — Telephone Encounter (Signed)
Pt called back returning your call. Please advise, thank you!  Call pt @ [phone]

## 2016-12-22 MED ORDER — ROSUVASTATIN CALCIUM 5 MG PO TABS: 5.0000 mg | ORAL_TABLET | Freq: Every day | ORAL | 5 refills | Status: DC

## 2016-12-22 NOTE — Telephone Encounter (Signed)
Spoke with pt and informed him of the message below. Pt stated that he would like to try the Crestor first and see if he is able to tolerate it.

## 2016-12-22 NOTE — Telephone Encounter (Signed)
Generic crestor 5 mg daily rx sent

## 2016-12-23 NOTE — Telephone Encounter (Signed)
LMTCB

## 2016-12-27 NOTE — Telephone Encounter (Signed)
LMTCB

## 2017-01-04 DIAGNOSIS — M9903 Segmental and somatic dysfunction of lumbar region: Secondary | ICD-10-CM | POA: Diagnosis not present

## 2017-01-04 DIAGNOSIS — M47817 Spondylosis without myelopathy or radiculopathy, lumbosacral region: Secondary | ICD-10-CM | POA: Diagnosis not present

## 2017-01-04 DIAGNOSIS — M791 Myalgia: Secondary | ICD-10-CM | POA: Diagnosis not present

## 2017-01-04 DIAGNOSIS — M9902 Segmental and somatic dysfunction of thoracic region: Secondary | ICD-10-CM | POA: Diagnosis not present

## 2017-01-04 DIAGNOSIS — M9901 Segmental and somatic dysfunction of cervical region: Secondary | ICD-10-CM | POA: Diagnosis not present

## 2017-01-04 DIAGNOSIS — M47812 Spondylosis without myelopathy or radiculopathy, cervical region: Secondary | ICD-10-CM | POA: Diagnosis not present

## 2017-01-07 DIAGNOSIS — M47812 Spondylosis without myelopathy or radiculopathy, cervical region: Secondary | ICD-10-CM | POA: Diagnosis not present

## 2017-01-07 DIAGNOSIS — M9901 Segmental and somatic dysfunction of cervical region: Secondary | ICD-10-CM | POA: Diagnosis not present

## 2017-01-10 DIAGNOSIS — M9901 Segmental and somatic dysfunction of cervical region: Secondary | ICD-10-CM | POA: Diagnosis not present

## 2017-01-10 DIAGNOSIS — M9903 Segmental and somatic dysfunction of lumbar region: Secondary | ICD-10-CM | POA: Diagnosis not present

## 2017-01-10 DIAGNOSIS — M9902 Segmental and somatic dysfunction of thoracic region: Secondary | ICD-10-CM | POA: Diagnosis not present

## 2017-01-10 DIAGNOSIS — M47817 Spondylosis without myelopathy or radiculopathy, lumbosacral region: Secondary | ICD-10-CM | POA: Diagnosis not present

## 2017-01-10 DIAGNOSIS — M791 Myalgia: Secondary | ICD-10-CM | POA: Diagnosis not present

## 2017-01-10 DIAGNOSIS — M47812 Spondylosis without myelopathy or radiculopathy, cervical region: Secondary | ICD-10-CM | POA: Diagnosis not present

## 2017-01-13 DIAGNOSIS — M47812 Spondylosis without myelopathy or radiculopathy, cervical region: Secondary | ICD-10-CM | POA: Diagnosis not present

## 2017-01-13 DIAGNOSIS — M47817 Spondylosis without myelopathy or radiculopathy, lumbosacral region: Secondary | ICD-10-CM | POA: Diagnosis not present

## 2017-01-13 DIAGNOSIS — M9902 Segmental and somatic dysfunction of thoracic region: Secondary | ICD-10-CM | POA: Diagnosis not present

## 2017-01-13 DIAGNOSIS — M9901 Segmental and somatic dysfunction of cervical region: Secondary | ICD-10-CM | POA: Diagnosis not present

## 2017-01-13 DIAGNOSIS — M791 Myalgia: Secondary | ICD-10-CM | POA: Diagnosis not present

## 2017-01-13 DIAGNOSIS — M9903 Segmental and somatic dysfunction of lumbar region: Secondary | ICD-10-CM | POA: Diagnosis not present

## 2017-01-17 DIAGNOSIS — M47817 Spondylosis without myelopathy or radiculopathy, lumbosacral region: Secondary | ICD-10-CM | POA: Diagnosis not present

## 2017-01-17 DIAGNOSIS — M791 Myalgia: Secondary | ICD-10-CM | POA: Diagnosis not present

## 2017-01-17 DIAGNOSIS — M9902 Segmental and somatic dysfunction of thoracic region: Secondary | ICD-10-CM | POA: Diagnosis not present

## 2017-01-17 DIAGNOSIS — M47812 Spondylosis without myelopathy or radiculopathy, cervical region: Secondary | ICD-10-CM | POA: Diagnosis not present

## 2017-01-17 DIAGNOSIS — M9901 Segmental and somatic dysfunction of cervical region: Secondary | ICD-10-CM | POA: Diagnosis not present

## 2017-01-17 DIAGNOSIS — M9903 Segmental and somatic dysfunction of lumbar region: Secondary | ICD-10-CM | POA: Diagnosis not present

## 2017-01-20 ENCOUNTER — Other Ambulatory Visit: Payer: Self-pay | Admitting: Internal Medicine

## 2017-01-20 DIAGNOSIS — M9902 Segmental and somatic dysfunction of thoracic region: Secondary | ICD-10-CM | POA: Diagnosis not present

## 2017-01-20 DIAGNOSIS — M47812 Spondylosis without myelopathy or radiculopathy, cervical region: Secondary | ICD-10-CM | POA: Diagnosis not present

## 2017-01-20 DIAGNOSIS — M9901 Segmental and somatic dysfunction of cervical region: Secondary | ICD-10-CM | POA: Diagnosis not present

## 2017-01-20 DIAGNOSIS — M9903 Segmental and somatic dysfunction of lumbar region: Secondary | ICD-10-CM | POA: Diagnosis not present

## 2017-01-20 DIAGNOSIS — M791 Myalgia: Secondary | ICD-10-CM | POA: Diagnosis not present

## 2017-01-20 DIAGNOSIS — M47817 Spondylosis without myelopathy or radiculopathy, lumbosacral region: Secondary | ICD-10-CM | POA: Diagnosis not present

## 2017-01-25 DIAGNOSIS — M47812 Spondylosis without myelopathy or radiculopathy, cervical region: Secondary | ICD-10-CM | POA: Diagnosis not present

## 2017-01-25 DIAGNOSIS — M9902 Segmental and somatic dysfunction of thoracic region: Secondary | ICD-10-CM | POA: Diagnosis not present

## 2017-01-25 DIAGNOSIS — M791 Myalgia: Secondary | ICD-10-CM | POA: Diagnosis not present

## 2017-01-25 DIAGNOSIS — M9903 Segmental and somatic dysfunction of lumbar region: Secondary | ICD-10-CM | POA: Diagnosis not present

## 2017-01-25 DIAGNOSIS — M47817 Spondylosis without myelopathy or radiculopathy, lumbosacral region: Secondary | ICD-10-CM | POA: Diagnosis not present

## 2017-01-25 DIAGNOSIS — M9901 Segmental and somatic dysfunction of cervical region: Secondary | ICD-10-CM | POA: Diagnosis not present

## 2017-01-27 DIAGNOSIS — M9903 Segmental and somatic dysfunction of lumbar region: Secondary | ICD-10-CM | POA: Diagnosis not present

## 2017-01-27 DIAGNOSIS — M47817 Spondylosis without myelopathy or radiculopathy, lumbosacral region: Secondary | ICD-10-CM | POA: Diagnosis not present

## 2017-01-27 DIAGNOSIS — M9901 Segmental and somatic dysfunction of cervical region: Secondary | ICD-10-CM | POA: Diagnosis not present

## 2017-01-27 DIAGNOSIS — M609 Myositis, unspecified: Secondary | ICD-10-CM | POA: Diagnosis not present

## 2017-01-27 DIAGNOSIS — M9902 Segmental and somatic dysfunction of thoracic region: Secondary | ICD-10-CM | POA: Diagnosis not present

## 2017-01-27 DIAGNOSIS — M791 Myalgia: Secondary | ICD-10-CM | POA: Diagnosis not present

## 2017-01-31 DIAGNOSIS — M9901 Segmental and somatic dysfunction of cervical region: Secondary | ICD-10-CM | POA: Diagnosis not present

## 2017-01-31 DIAGNOSIS — M47817 Spondylosis without myelopathy or radiculopathy, lumbosacral region: Secondary | ICD-10-CM | POA: Diagnosis not present

## 2017-01-31 DIAGNOSIS — M47812 Spondylosis without myelopathy or radiculopathy, cervical region: Secondary | ICD-10-CM | POA: Diagnosis not present

## 2017-01-31 DIAGNOSIS — M609 Myositis, unspecified: Secondary | ICD-10-CM | POA: Diagnosis not present

## 2017-01-31 DIAGNOSIS — M9902 Segmental and somatic dysfunction of thoracic region: Secondary | ICD-10-CM | POA: Diagnosis not present

## 2017-01-31 DIAGNOSIS — M9903 Segmental and somatic dysfunction of lumbar region: Secondary | ICD-10-CM | POA: Diagnosis not present

## 2017-02-03 DIAGNOSIS — M9902 Segmental and somatic dysfunction of thoracic region: Secondary | ICD-10-CM | POA: Diagnosis not present

## 2017-02-03 DIAGNOSIS — M9903 Segmental and somatic dysfunction of lumbar region: Secondary | ICD-10-CM | POA: Diagnosis not present

## 2017-02-03 DIAGNOSIS — M609 Myositis, unspecified: Secondary | ICD-10-CM | POA: Diagnosis not present

## 2017-02-03 DIAGNOSIS — M9901 Segmental and somatic dysfunction of cervical region: Secondary | ICD-10-CM | POA: Diagnosis not present

## 2017-02-03 DIAGNOSIS — M47817 Spondylosis without myelopathy or radiculopathy, lumbosacral region: Secondary | ICD-10-CM | POA: Diagnosis not present

## 2017-02-03 DIAGNOSIS — M47812 Spondylosis without myelopathy or radiculopathy, cervical region: Secondary | ICD-10-CM | POA: Diagnosis not present

## 2017-02-07 DIAGNOSIS — M9902 Segmental and somatic dysfunction of thoracic region: Secondary | ICD-10-CM | POA: Diagnosis not present

## 2017-02-07 DIAGNOSIS — M47812 Spondylosis without myelopathy or radiculopathy, cervical region: Secondary | ICD-10-CM | POA: Diagnosis not present

## 2017-02-07 DIAGNOSIS — M9901 Segmental and somatic dysfunction of cervical region: Secondary | ICD-10-CM | POA: Diagnosis not present

## 2017-02-07 DIAGNOSIS — M9903 Segmental and somatic dysfunction of lumbar region: Secondary | ICD-10-CM | POA: Diagnosis not present

## 2017-02-07 DIAGNOSIS — M609 Myositis, unspecified: Secondary | ICD-10-CM | POA: Diagnosis not present

## 2017-02-07 DIAGNOSIS — M47817 Spondylosis without myelopathy or radiculopathy, lumbosacral region: Secondary | ICD-10-CM | POA: Diagnosis not present

## 2017-03-03 DIAGNOSIS — M9902 Segmental and somatic dysfunction of thoracic region: Secondary | ICD-10-CM | POA: Diagnosis not present

## 2017-03-03 DIAGNOSIS — M461 Sacroiliitis, not elsewhere classified: Secondary | ICD-10-CM | POA: Diagnosis not present

## 2017-03-03 DIAGNOSIS — M609 Myositis, unspecified: Secondary | ICD-10-CM | POA: Diagnosis not present

## 2017-03-03 DIAGNOSIS — M545 Low back pain: Secondary | ICD-10-CM | POA: Diagnosis not present

## 2017-03-03 DIAGNOSIS — M9901 Segmental and somatic dysfunction of cervical region: Secondary | ICD-10-CM | POA: Diagnosis not present

## 2017-03-03 DIAGNOSIS — M9904 Segmental and somatic dysfunction of sacral region: Secondary | ICD-10-CM | POA: Diagnosis not present

## 2017-03-03 DIAGNOSIS — M9903 Segmental and somatic dysfunction of lumbar region: Secondary | ICD-10-CM | POA: Diagnosis not present

## 2017-03-03 DIAGNOSIS — M47812 Spondylosis without myelopathy or radiculopathy, cervical region: Secondary | ICD-10-CM | POA: Diagnosis not present

## 2017-03-14 DIAGNOSIS — M9902 Segmental and somatic dysfunction of thoracic region: Secondary | ICD-10-CM | POA: Diagnosis not present

## 2017-03-14 DIAGNOSIS — M546 Pain in thoracic spine: Secondary | ICD-10-CM | POA: Diagnosis not present

## 2017-03-14 DIAGNOSIS — M545 Low back pain: Secondary | ICD-10-CM | POA: Diagnosis not present

## 2017-03-14 DIAGNOSIS — M609 Myositis, unspecified: Secondary | ICD-10-CM | POA: Diagnosis not present

## 2017-03-14 DIAGNOSIS — M9903 Segmental and somatic dysfunction of lumbar region: Secondary | ICD-10-CM | POA: Diagnosis not present

## 2017-03-14 DIAGNOSIS — M9901 Segmental and somatic dysfunction of cervical region: Secondary | ICD-10-CM | POA: Diagnosis not present

## 2017-03-14 DIAGNOSIS — M47812 Spondylosis without myelopathy or radiculopathy, cervical region: Secondary | ICD-10-CM | POA: Diagnosis not present

## 2017-03-14 DIAGNOSIS — M9905 Segmental and somatic dysfunction of pelvic region: Secondary | ICD-10-CM | POA: Diagnosis not present

## 2017-03-29 DIAGNOSIS — M609 Myositis, unspecified: Secondary | ICD-10-CM | POA: Diagnosis not present

## 2017-03-29 DIAGNOSIS — M545 Low back pain: Secondary | ICD-10-CM | POA: Diagnosis not present

## 2017-03-29 DIAGNOSIS — M9901 Segmental and somatic dysfunction of cervical region: Secondary | ICD-10-CM | POA: Diagnosis not present

## 2017-03-29 DIAGNOSIS — M47812 Spondylosis without myelopathy or radiculopathy, cervical region: Secondary | ICD-10-CM | POA: Diagnosis not present

## 2017-03-29 DIAGNOSIS — M9903 Segmental and somatic dysfunction of lumbar region: Secondary | ICD-10-CM | POA: Diagnosis not present

## 2017-03-29 DIAGNOSIS — M9902 Segmental and somatic dysfunction of thoracic region: Secondary | ICD-10-CM | POA: Diagnosis not present

## 2017-04-08 ENCOUNTER — Ambulatory Visit (INDEPENDENT_AMBULATORY_CARE_PROVIDER_SITE_OTHER): Payer: Medicare Other

## 2017-04-08 ENCOUNTER — Other Ambulatory Visit: Payer: Self-pay

## 2017-04-08 VITALS — BP 170/78 | HR 67 | Temp 98.0°F | Resp 16 | Ht 66.0 in | Wt 162.8 lb

## 2017-04-08 DIAGNOSIS — Z0000 Encounter for general adult medical examination without abnormal findings: Secondary | ICD-10-CM | POA: Diagnosis not present

## 2017-04-08 DIAGNOSIS — Z1331 Encounter for screening for depression: Secondary | ICD-10-CM | POA: Diagnosis not present

## 2017-04-08 MED ORDER — GABAPENTIN 100 MG PO CAPS: 100.0000 mg | ORAL_CAPSULE | Freq: Every day | ORAL | 0 refills | Status: DC

## 2017-04-08 NOTE — Progress Notes (Signed)
Subjective:   [name] is a 71 y.o. male who presents for Medicare Annual/Subsequent preventive examination.  Review of Systems:  No ROS.  Medicare Wellness Visit. Additional risk factors are reflected in the social history.  Cardiac Risk Factors include: advanced age (>30men, >7 women);hypertension;smoking/ tobacco exposure;diabetes mellitus     Objective:    Vitals: BP (!) 170/78 (BP Location: Right Arm, Patient Position: Sitting, Cuff Size: Normal)   Pulse 67   Temp 98 F (36.7 C) (Oral)   Resp 16   Ht 5\' 6"  (1.676 m)   Wt 162 lb 12 oz (73.8 kg)   SpO2 99%   BMI 26.27 kg/m   Body mass index is 26.27 kg/m.  Tobacco Social History   Tobacco Use  Smoking Status Current Every Day Smoker  . Packs/day: 1.50  . Types: Cigarettes  Smokeless Tobacco Never Used     Ready to quit: No Counseling given: Not Answered   Past Medical History:  Diagnosis Date  . 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 Hyperlipidemia   . Hypertension   . Migraine    cluster  . Obstructive sleep apnea   . PAD (peripheral artery disease) [facility]) Feb 2009   nonobstructing, renal angiogram ([facility])  . Renal cell carcinoma 2004   left kidney heminephrectomy  . TIA (transient ischemic attack) Aug 2013  . tobacco abuse    Past Surgical History:  Procedure Laterality Date  . CARDIAC CATHETERIZATION     Dr. [name] [facility])   . cyst removal  12/25/2015   Spine L4 and L5  . heminephrectomy  2004   for renal cell CA  . RENAL CRYOABLATION  Jan 2006   right kidney,  [name]  . sciatica     Family History  Problem Relation Age of Onset  . Hypertension Mother   . Cancer Mother        breast  . Coronary artery disease Father   . Hypertension Father   . Aneurysm Maternal Grandmother        brain  . Aneurysm Paternal Grandmother        brain  . Coronary artery disease Paternal Grandfather   . Heart disease Brother   . COPD Brother   .  Hypertension Brother    Social History   Substance and Sexual Activity  Sexual Activity Yes    Outpatient Encounter Medications as of 04/08/2017  Medication Sig  . amLODipine (NORVASC) 10 MG tablet TAKE 1 TABLET BY MOUTH DAILY  . aspirin 81 MG tablet Take 1 tablet (81 mg total) by mouth daily.  . diphenhydrAMINE (BENADRYL) 25 MG tablet Take 25 mg by mouth every 6 (six) hours as needed.  . irbesartan (AVAPRO) 300 MG tablet Take 1 tablet (300 mg total) by mouth daily.  . rosuvastatin (CRESTOR) 5 MG tablet Take 1 tablet (5 mg total) by mouth daily.  . sildenafil (REVATIO) 20 MG tablet 2-5 tablets as needed 1 hour prior to intercourse  . traMADol (ULTRAM) 50 MG tablet Take 1 tablet (50 mg total) by mouth every 6 (six) hours as needed.  . [DISCONTINUED] gabapentin (NEURONTIN) 100 MG capsule Take 1 capsule (100 mg total) by mouth at bedtime.  . nebivolol (BYSTOLIC) 5 MG tablet Take 1 tablet (5 mg total) by mouth daily. (Patient not taking: Reported on 11/25/2016)   No facility-administered encounter medications on file as of 04/08/2017.     Activities of Daily Living In your  present state of health, do you have any difficulty performing the following activities: 04/08/2017  Hearing? Y  Vision? N  Walking or climbing stairs? N  Dressing or bathing? N  Doing errands, shopping? N  Preparing Food and eating ? N  Using the Toilet? N  In the past six months, have you accidently leaked urine? N  Do you have problems with loss of bowel control? N  Managing your Medications? N  Managing your Finances? N  Housekeeping or managing your Housekeeping? N  Some recent data might be hidden    Patient Care Team: Crecencio Mc, MD as PCP - General (Internal Medicine)   Assessment:    This is a routine wellness examination for [name]. The goal of the wellness visit is to assist the patient how to close the gaps in care and create a preventative care plan for the patient.   The roster of all  physicians providing medical care to patient is listed in the Snapshot section of the chart.  Taking calcium VIT D as appropriate/Osteoporosis risk reviewed.    Safety issues reviewed; Smoke and carbon monoxide detectors in the home. Firearms locked up in the home. Wears seatbelts when driving or riding with others. Patient does wear sunscreen or protective clothing when in direct sunlight. No violence in the home.  Depression- PHQ 2 &9 complete.  No signs/symptoms or verbal communication regarding little pleasure in doing things, feeling down, depressed or hopeless. No changes in sleeping, energy, eating, concentrating.  No thoughts of self harm or harm towards others.  Time spent on this topic is 8 minutes.   Patient is alert, normal appearance, oriented to person/place/and time.  Correctly identified the president of the [place], recall of 2/3 words, and performing simple calculations. Displays appropriate judgement and can read correct time from watch face.   No new identified risk were noted.  No failures at ADL's or IADL's.    BMI- discussed the importance of a healthy diet, water intake and the benefits of aerobic exercise. Educational material provided.   24 hour diet recall:  Dental- every 6 months.  Eye- Visual acuity not assessed per patient preference.  Wears corrective lenses.  Sleep patterns- Sleeps 8 hours at night.  Wakes feeling rested.  Influenza vaccine declined.   Medications; no longer taking Crestor or Bystolic.  Avavapro refilled.  Taking all other scheduled medication as prescribed.  Colonoscopy/Cologuard discussed.  Educational material provided.  Patient Concerns: Blood pressure follow up scheduled.                     Exercise Activities and Dietary recommendations Current Exercise Habits: The patient does not participate in regular exercise at present  Fall Risk Fall Risk  04/08/2017 05/11/2016 04/07/2016 01/19/2016 08/22/2014  Falls in the past year? No  No No Yes No  Comment - - - [facility] Telephone Survey: data to providers prior to load -  Number falls in past yr: - - - 1 -  Comment - - - [facility] Telephone Survey Actual Response = 1 -  Injury with Fall? - - - No -   Depression Screen PHQ 2/9 Scores 04/08/2017 05/11/2016 04/07/2016 08/22/2014  PHQ - 2 Score 0 0 0 0  PHQ- 9 Score 0 - - -    Cognitive Function     6CIT Screen 04/08/2017 04/07/2016  What Year? 0 points 0 points  What month? 0 points 0 points  What time? - 0 points  Count back from  20 - 0 points  Months in reverse - 0 points    Immunization History  Administered Date(s) Administered  . Pneumococcal Conjugate-13 02/15/2014  . Pneumococcal Polysaccharide-23 01/23/2013  . Tdap 02/03/2009   Screening Tests Health Maintenance  Topic Date Due  . Hepatitis C Screening  04/10/1946  . OPHTHALMOLOGY EXAM  09/09/2014  . COLONOSCOPY  05/24/2016  . FOOT EXAM  07/17/2016  . INFLUENZA VACCINE  09/01/2017 (Originally 12/22/2016)  . HEMOGLOBIN A1C  05/11/2017  . TETANUS/TDAP  02/04/2019  . PNA vac Low Risk Adult  Completed      Plan:    End of life planning; Advance aging; Advanced directives discussed. Copy of current HCPOA/Living Will requested.    I have personally reviewed and noted the following in the patient's chart:   . Medical and social history . Use of alcohol, tobacco or illicit drugs  . Current medications and supplements . Functional ability and status . Nutritional status . Physical activity . Advanced directives . List of other physicians . Hospitalizations, surgeries, and ER visits in previous 12 months . Vitals . Screenings to include cognitive, depression, and falls . Referrals and appointments  In addition, I have reviewed and discussed with patient certain preventive protocols, quality metrics, and best practice recommendations. A written personalized care plan for preventive services as well as general preventive health recommendations were  provided to patient.     OBrien-Blaney, Denisa L, LPN  08/05/9456   I have reviewed the above information and agree with above.   Deborra Medina, MD

## 2017-04-08 NOTE — Patient Instructions (Addendum)
[name] , Thank you for taking time to come for your Medicare Wellness Visit. I appreciate your ongoing commitment to your health goals. Please review the following plan we discussed and let me know if I can assist you in the future.   Follow up with Dr. Derrel Nip as needed.    Bring a copy of your [place] and/or Living Will to be scanned into chart.  Have a great day!  These are the goals we discussed: Smoke less    This is a list of the screening recommended for you and due dates:  Health Maintenance  Topic Date Due  .  Hepatitis C: One time screening is recommended by Center for Disease Control  (CDC) for  adults born from [birth year] through 1965.   February 25, 1946  . Eye exam for diabetics  09/09/2014  . Colon Cancer Screening  05/24/2016  . Complete foot exam   07/17/2016  . Flu Shot  09/01/2017*  . Hemoglobin A1C  05/11/2017  . Tetanus Vaccine  02/04/2019  . Pneumonia vaccines  Completed  *Topic was postponed. The date shown is not the original due date.    Colonoscopy, Adult A colonoscopy is an exam to look at the entire large intestine. During the exam, a lubricated, bendable tube is inserted into the anus and then passed into the rectum, colon, and other parts of the large intestine. A colonoscopy is often done as a part of normal colorectal screening or in response to certain symptoms, such as anemia, persistent diarrhea, abdominal pain, and blood in the stool. The exam can help screen for and diagnose medical problems, including:  Tumors.  Polyps.  Inflammation.  Areas of bleeding.  Tell a health care provider about:  Any allergies you have.  All medicines you are taking, including vitamins, herbs, eye drops, creams, and over-the-counter medicines.  Any problems you or family members have had with anesthetic medicines.  Any blood disorders you have.  Any surgeries you have had.  Any medical conditions you have.  Any problems you have had  passing stool. What are the risks? Generally, this is a safe procedure. However, problems may occur, including:  Bleeding.  A tear in the intestine.  A reaction to medicines given during the exam.  Infection (rare).  What happens before the procedure? Eating and drinking restrictions Follow instructions from your health care provider about eating and drinking, which may include:  A few days before the procedure - follow a low-fiber diet. Avoid nuts, seeds, dried fruit, raw fruits, and vegetables.  1-3 days before the procedure - follow a clear liquid diet. Drink only clear liquids, such as clear broth or bouillon, black coffee or tea, clear juice, clear soft drinks or sports drinks, gelatin dessert, and popsicles. Avoid any liquids that contain red or purple dye.  On the day of the procedure - do not eat or drink anything during the 2 hours before the procedure, or within the time period that your health care provider recommends.  Bowel prep If you were prescribed an oral bowel prep to clean out your colon:  Take it as told by your health care provider. Starting the day before your procedure, you will need to drink a large amount of medicated liquid. The liquid will cause you to have multiple loose stools until your stool is almost clear or light green.  If your skin or anus gets irritated from diarrhea, you may use these to relieve the irritation: ? Medicated wipes,  such as adult wet wipes with aloe and vitamin E. ? A skin soothing-product like petroleum jelly.  If you vomit while drinking the bowel prep, take a break for up to 60 minutes and then begin the bowel prep again. If vomiting continues and you cannot take the bowel prep without vomiting, call your health care provider.  General instructions  Ask your health care provider about changing or stopping your regular medicines. This is especially important if you are taking diabetes medicines or blood thinners.  Plan to have  someone take you home from the hospital or clinic. What happens during the procedure?  An IV tube may be inserted into one of your veins.  You will be given medicine to help you relax (sedative).  To reduce your risk of infection: ? Your health care team will wash or sanitize their hands. ? Your anal area will be washed with soap.  You will be asked to lie on your side with your knees bent.  Your health care provider will lubricate a long, thin, flexible tube. The tube will have a camera and a light on the end.  The tube will be inserted into your anus.  The tube will be gently eased through your rectum and colon.  Air will be delivered into your colon to keep it open. You may feel some pressure or cramping.  The camera will be used to take images during the procedure.  A small tissue sample may be removed from your body to be examined under a microscope (biopsy). If any potential problems are found, the tissue will be sent to a lab for testing.  If small polyps are found, your health care provider may remove them and have them checked for cancer cells.  The tube that was inserted into your anus will be slowly removed. The procedure may vary among health care providers and hospitals. What happens after the procedure?  Your blood pressure, heart rate, breathing rate, and blood oxygen level will be monitored until the medicines you were given have worn off.  Do not drive for 24 hours after the exam.  You may have a small amount of blood in your stool.  You may pass gas and have mild abdominal cramping or bloating due to the air that was used to inflate your colon during the exam.  It is up to you to get the results of your procedure. Ask your health care provider, or the department performing the procedure, when your results will be ready. This information is not intended to replace advice given to you by your health care provider. Make sure you discuss any questions you have  with your health care provider. Document Released: 05/07/2000 Document Revised: 03/10/2016 Document Reviewed: 07/22/2015 Elsevier Interactive Patient Education  2018 [employer].

## 2017-04-11 DIAGNOSIS — M545 Low back pain: Secondary | ICD-10-CM | POA: Diagnosis not present

## 2017-04-11 DIAGNOSIS — M47812 Spondylosis without myelopathy or radiculopathy, cervical region: Secondary | ICD-10-CM | POA: Diagnosis not present

## 2017-04-11 DIAGNOSIS — M609 Myositis, unspecified: Secondary | ICD-10-CM | POA: Diagnosis not present

## 2017-04-11 DIAGNOSIS — M9902 Segmental and somatic dysfunction of thoracic region: Secondary | ICD-10-CM | POA: Diagnosis not present

## 2017-04-11 DIAGNOSIS — M9903 Segmental and somatic dysfunction of lumbar region: Secondary | ICD-10-CM | POA: Diagnosis not present

## 2017-04-11 DIAGNOSIS — M9901 Segmental and somatic dysfunction of cervical region: Secondary | ICD-10-CM | POA: Diagnosis not present

## 2017-04-11 DIAGNOSIS — M9904 Segmental and somatic dysfunction of sacral region: Secondary | ICD-10-CM | POA: Diagnosis not present

## 2017-04-11 DIAGNOSIS — M461 Sacroiliitis, not elsewhere classified: Secondary | ICD-10-CM | POA: Diagnosis not present

## 2017-04-26 DIAGNOSIS — M47812 Spondylosis without myelopathy or radiculopathy, cervical region: Secondary | ICD-10-CM | POA: Diagnosis not present

## 2017-04-26 DIAGNOSIS — M9902 Segmental and somatic dysfunction of thoracic region: Secondary | ICD-10-CM | POA: Diagnosis not present

## 2017-04-26 DIAGNOSIS — M47817 Spondylosis without myelopathy or radiculopathy, lumbosacral region: Secondary | ICD-10-CM | POA: Diagnosis not present

## 2017-04-26 DIAGNOSIS — M546 Pain in thoracic spine: Secondary | ICD-10-CM | POA: Diagnosis not present

## 2017-04-26 DIAGNOSIS — M9901 Segmental and somatic dysfunction of cervical region: Secondary | ICD-10-CM | POA: Diagnosis not present

## 2017-04-26 DIAGNOSIS — M9903 Segmental and somatic dysfunction of lumbar region: Secondary | ICD-10-CM | POA: Diagnosis not present

## 2017-04-29 ENCOUNTER — Other Ambulatory Visit: Payer: Self-pay | Admitting: Internal Medicine

## 2017-05-02 ENCOUNTER — Ambulatory Visit: Payer: Medicare Other | Admitting: Internal Medicine

## 2017-05-11 ENCOUNTER — Ambulatory Visit (INDEPENDENT_AMBULATORY_CARE_PROVIDER_SITE_OTHER): Payer: Medicare Other | Admitting: Internal Medicine

## 2017-05-11 ENCOUNTER — Encounter: Payer: Self-pay | Admitting: Internal Medicine

## 2017-05-11 VITALS — BP 190/80 | HR 65 | Temp 97.7°F | Resp 15 | Ht 66.0 in | Wt 160.8 lb

## 2017-05-11 DIAGNOSIS — E1121 Type 2 diabetes mellitus with diabetic nephropathy: Secondary | ICD-10-CM | POA: Diagnosis not present

## 2017-05-11 DIAGNOSIS — E118 Type 2 diabetes mellitus with unspecified complications: Secondary | ICD-10-CM

## 2017-05-11 DIAGNOSIS — I129 Hypertensive chronic kidney disease with stage 1 through stage 4 chronic kidney disease, or unspecified chronic kidney disease: Secondary | ICD-10-CM | POA: Diagnosis not present

## 2017-05-11 DIAGNOSIS — I739 Peripheral vascular disease, unspecified: Secondary | ICD-10-CM

## 2017-05-11 DIAGNOSIS — E7800 Pure hypercholesterolemia, unspecified: Secondary | ICD-10-CM

## 2017-05-11 LAB — LDL CHOLESTEROL, DIRECT: Direct LDL: 134 mg/dL

## 2017-05-11 LAB — COMPREHENSIVE METABOLIC PANEL
ALT: 8 U/L (ref 0–53)
AST: 9 U/L (ref 0–37)
Albumin: 4 g/dL (ref 3.5–5.2)
Alkaline Phosphatase: 136 U/L — ABNORMAL HIGH (ref 39–117)
BUN: 42 mg/dL — ABNORMAL HIGH (ref 6–23)
CO2: 21 mEq/L (ref 19–32)
Calcium: 8.7 mg/dL (ref 8.4–10.5)
Chloride: 108 mEq/L (ref 96–112)
Creatinine, Ser: 4.1 mg/dL — ABNORMAL HIGH (ref 0.40–1.50)
GFR: 15.34 mL/min — ABNORMAL LOW (ref >60.00)
Glucose, Bld: 99 mg/dL (ref 70–99)
Potassium: 3.8 mEq/L (ref 3.5–5.1)
Sodium: 136 mEq/L (ref 135–145)
Total Bilirubin: 0.5 mg/dL (ref 0.2–1.2)
Total Protein: 7.4 g/dL (ref 6.0–8.3)

## 2017-05-11 LAB — MICROALBUMIN / CREATININE URINE RATIO
Creatinine,U: 36.4 mg/dL
Microalb Creat Ratio: 216.3 mg/g — ABNORMAL HIGH (ref 0.0–30.0)
Microalb, Ur: 78.6 mg/dL — ABNORMAL HIGH (ref 0.0–1.9)

## 2017-05-11 LAB — LIPID PANEL
Cholesterol: 174 mg/dL (ref 0–200)
HDL: 27 mg/dL — ABNORMAL LOW (ref >39.00)
NonHDL: 146.66
Total CHOL/HDL Ratio: 6
Triglycerides: 222 mg/dL — ABNORMAL HIGH (ref 0.0–149.0)
VLDL: 44.4 mg/dL — ABNORMAL HIGH (ref 0.0–40.0)

## 2017-05-11 LAB — HEMOGLOBIN A1C: Hgb A1c MFr Bld: 5.9 % (ref 4.6–6.5)

## 2017-05-11 MED ORDER — IRBESARTAN 300 MG PO TABS: 300.0000 mg | ORAL_TABLET | Freq: Every day | ORAL | 5 refills | Status: DC

## 2017-05-11 NOTE — Progress Notes (Signed)
Subjective:  Patient ID: [name], male    DOB: Aug 20, 1945  Age: 71 y.o. MRN: [id number]  CC: The primary encounter diagnosis was Controlled type 2 diabetes mellitus with complication, without long-term current use of insulin ([place]). Diagnoses of Renal sclerosis with hypertension, stage 1 through stage 4 or unspecified chronic kidney disease, Type 2 diabetes mellitus with diabetic nephropathy, without long-term current use of insulin ([place]), PAD (peripheral artery disease) ([place]), and Pure hypercholesterolemia were also pertinent to this visit.  HPI [name] presents for  6 MONTH FOLLOW UP ON TYPE 2 DM, RENAL HYPERTENSION, and PAD/   Patient does not check blood sugars more than once a month,  Last one was 135 a month ago in a fasting state.  Dos not recall any above 200 OR less than 80.  No complaints today.  T  Not exercising on a regular basis or trying to lose weight.  Patient voices awareness  of the foods he/she needs to avoid,  And follows a low GI diet about 50% of the time.  Has not had an annual diabetic eye exam.  Denies numbness and tingling in lower extremities.  Denies hypoglycemic symptoms.   Lab Results  Component Value Date   HGBA1C 5.9 05/11/2017    Hypertension: patient has been checking his  blood pressure at home.  His lowest  Systolic reading was 671,  More recently 170's.  He has not taken irbesartan in 6 months because he states that his pharmacy mixed it up.  Did not call office to notify us.  Chart shows that it was sent electronically 6 months ago.  S Does not tolerate blood pressure below 245 systolic due to severe PAD. Has not seen nephrology  In several years because 'they never did anything different than you did."   Long discussion today about his wife [name]'s occult alcohol abuse.   drinking 1.5 L  wine daily, along with illicit use of his oxycodoen (which he has now hidden upon discovery), as well as alprazolam and gabapentin.  Several family friends  have voiced their concern to him. He has voiced his concern on several occasions.  but she starts crying and  accuses him of being "embarassed by her." .Marland Kitchen   Has been seeing a chiropractor in [place] since August  for neck pain ,  Non radicular,  And right sided sciatica  low back pain.  Really helping .  Started with 2 sessions per /week,  Now every 3 weeks .  Started in August      Outpatient Medications Prior to Visit  Medication Sig Dispense Refill  . amLODipine (NORVASC) 10 MG tablet TAKE 1 TABLET BY MOUTH DAILY 90 tablet 0  . aspirin 81 MG tablet Take 1 tablet (81 mg total) by mouth daily. 30 tablet 11  . diphenhydrAMINE (BENADRYL) 25 MG tablet Take 25 mg by mouth every 6 (six) hours as needed.    . gabapentin (NEURONTIN) 100 MG capsule Take 1 capsule (100 mg total) at bedtime by mouth. 90 capsule 0  . sildenafil (REVATIO) 20 MG tablet 2-5 tablets as needed 1 hour prior to intercourse    . traMADol (ULTRAM) 50 MG tablet Take 1 tablet (50 mg total) by mouth every 6 (six) hours as needed. 120 tablet 5  . irbesartan (AVAPRO) 300 MG tablet Take 1 tablet (300 mg total) by mouth daily. (Patient not taking: Reported on 05/11/2017) 30 tablet 5  . nebivolol (BYSTOLIC) 5 MG tablet Take 1 tablet (  5 mg total) by mouth daily. (Patient not taking: Reported on 05/11/2017) 90 tablet 0  . rosuvastatin (CRESTOR) 5 MG tablet Take 1 tablet (5 mg total) by mouth daily. (Patient not taking: Reported on 05/11/2017) 30 tablet 5   No facility-administered medications prior to visit.     Review of Systems;  Patient denies headache, fevers, malaise, unintentional weight loss, skin rash, eye pain, sinus congestion and sinus pain, sore throat, dysphagia,  hemoptysis , cough, dyspnea, wheezing, chest pain, palpitations, orthopnea, edema, abdominal pain, nausea, melena, diarrhea, constipation, flank pain, dysuria, hematuria, urinary  Frequency, nocturia, numbness, tingling, seizures,  Focal weakness, Loss of  consciousness,  Tremor, insomnia, depression, anxiety, and suicidal ideation.      Objective:  BP (!) 190/80 (BP Location: Left Arm, Patient Position: Sitting, Cuff Size: Normal)   Pulse 65   Temp 97.7 F (36.5 C) (Oral)   Resp 15   Ht 5\' 6"  (1.676 m)   Wt 160 lb 12.8 oz (72.9 kg)   SpO2 98%   BMI 25.95 kg/m   BP Readings from Last 3 Encounters:  05/11/17 (!) 190/80  04/08/17 (!) 170/78  11/25/16 (!) 186/80    Wt Readings from Last 3 Encounters:  05/11/17 160 lb 12.8 oz (72.9 kg)  04/08/17 162 lb 12 oz (73.8 kg)  11/25/16 159 lb 4 oz (72.2 kg)    General appearance: alert, cooperative and appears stated age Ears: normal TM's and external ear canals both ears Throat: lips, mucosa, and tongue normal; teeth and gums normal Neck: no adenopathy, no carotid bruit, supple, symmetrical, trachea midline and thyroid not enlarged, symmetric, no tenderness/mass/nodules Back: symmetric, no curvature. ROM normal. No CVA tenderness. Lungs: clear to auscultation bilaterally Heart: regular rate and rhythm, S1, S2 normal, no murmur, click, rub or gallop Abdomen: soft, non-tender; bowel sounds normal; no masses,  no organomegaly Pulses: 2+ and symmetric Skin: Skin color, texture, turgor normal. No rashes or lesions Lymph nodes: Cervical, supraclavicular, and axillary nodes normal.  Lab Results  Component Value Date   HGBA1C 5.9 05/11/2017   HGBA1C 6.1 11/09/2016   HGBA1C 5.9 05/11/2016    Lab Results  Component Value Date   CREATININE 4.10 (H) 05/11/2017   CREATININE 3.25 (H) 11/25/2016   CREATININE 3.27 (H) 11/17/2016    Lab Results  Component Value Date   WBC 10.9 (H) 05/11/2016   HGB 12.1 (L) 05/11/2016   HCT 35.6 (L) 05/11/2016   PLT 280.0 05/11/2016   GLUCOSE 99 05/11/2017   CHOL 174 05/11/2017   TRIG 222.0 (H) 05/11/2017   HDL 27.00 (L) 05/11/2017   LDLDIRECT 134.0 05/11/2017   LDLCALC 123 (H) 11/08/2013   LDLCALC 125 (H) 11/08/2013   ALT 8 05/11/2017   AST 9  05/11/2017   NA 136 05/11/2017   K 3.8 05/11/2017   CL 108 05/11/2017   CREATININE 4.10 (H) 05/11/2017   BUN 42 (H) 05/11/2017   CO2 21 05/11/2017   TSH 3.95 11/08/2013   PSA 4.34 (H) 07/26/2013   INR 0.9 01/03/2012   HGBA1C 5.9 05/11/2017   MICROALBUR 78.6 (H) 05/11/2017     Assessment & Plan:   Problem List Items Addressed This Visit    Controlled diabetes mellitus type 2 with complications ([place]) - Primary   Relevant Medications   irbesartan (AVAPRO) 300 MG tablet   Other Relevant Orders   Hemoglobin A1c (Completed)   Lipid panel (Completed)   Microalbumin / creatinine urine ratio (Completed)   Hypertensive renal sclerosis with hypertension  Relevant Medications   irbesartan (AVAPRO) 300 MG tablet   Other Relevant Orders   Comprehensive metabolic panel (Completed)   PTH, Intact and Calcium (Completed)   DM (diabetes mellitus), type 2 with renal complications ([facility])     well-controlled on diet alone   hemoglobin A1c has been consistently at or  less than 7.0 . Patient has worsening CKD and uncontrolled hypertension   (GFRis now < 20 ml /min)  due to medication non adherence with ARB.  Patient is intolerant of statin therapy and will resume ARB for reduction in proteinuria.  C  Lab Results  Component Value Date   HGBA1C 5.9 05/11/2017   Lab Results  Component Value Date   MICROALBUR 78.6 (H) 05/11/2017   Lab Results  Component Value Date   NA 136 05/11/2017   K 3.8 05/11/2017   CL 108 05/11/2017   CO2 21 05/11/2017         Relevant Medications   irbesartan (AVAPRO) 300 MG tablet   Hyperlipidemia    Risk of AMI in next 10 years is > 25% .  He is statin intolerant due to severe myalgias .  Will recommend trial of PSL9 inhibitor if covered by insurance  Lab Results  Component Value Date   CHOL 174 05/11/2017   HDL 27.00 (L) 05/11/2017   LDLCALC 123 (H) 11/08/2013   LDLCALC 125 (H) 11/08/2013   LDLDIRECT 134.0 05/11/2017   TRIG 222.0 (H) 05/11/2017    CHOLHDL 6 05/11/2017         Relevant Medications   irbesartan (AVAPRO) 300 MG tablet   PAD (peripheral artery disease) ([place])    His PAP results in intolerance to normotension due to bilateral carotid stenosis       Relevant Medications   irbesartan (AVAPRO) 300 MG tablet      I have discontinued Gillis L. Staton's nebivolol and rosuvastatin. I am also having him maintain his aspirin, diphenhydrAMINE, sildenafil, traMADol, gabapentin, amLODipine, and irbesartan.  Meds ordered this encounter  Medications  . irbesartan (AVAPRO) 300 MG tablet    Sig: Take 1 tablet (300 mg total) by mouth daily.    Dispense:  30 tablet    Refill:  5    Medications Discontinued During This Encounter  Medication Reason  . nebivolol (BYSTOLIC) 5 MG tablet Patient has not taken in last 30 days  . rosuvastatin (CRESTOR) 5 MG tablet Patient has not taken in last 30 days  . irbesartan (AVAPRO) 300 MG tablet Reorder    Follow-up: No Follow-up on file.   Crecencio Mc, MD

## 2017-05-11 NOTE — Patient Instructions (Signed)
We are resuming irbesartan 300 mg daily for blood pressure  If your kidney function has worsened,  I will send you back to nephrology to help manage it

## 2017-05-12 LAB — PTH, INTACT AND CALCIUM
Calcium: 8.9 mg/dL (ref 8.6–10.3)
PTH: 315 pg/mL — ABNORMAL HIGH (ref 14–64)

## 2017-05-12 NOTE — Assessment & Plan Note (Addendum)
well-controlled on diet alone   hemoglobin A1c has been consistently at or  less than 7.0 . Patient has worsening CKD and uncontrolled hypertension   (GFRis now < 20 ml /min)  due to medication non adherence with ARB.  Patient is intolerant of statin therapy and will resume ARB for reduction in proteinuria.  C  Lab Results  Component Value Date   HGBA1C 5.9 05/11/2017   Lab Results  Component Value Date   MICROALBUR 78.6 (H) 05/11/2017   Lab Results  Component Value Date   NA 136 05/11/2017   K 3.8 05/11/2017   CL 108 05/11/2017   CO2 21 05/11/2017

## 2017-05-14 NOTE — Assessment & Plan Note (Signed)
Risk of AMI in next 10 years is > 25% .  He is statin intolerant due to severe myalgias .  Will recommend trial of PSL9 inhibitor if covered by insurance  Lab Results  Component Value Date   CHOL 174 05/11/2017   HDL 27.00 (L) 05/11/2017   LDLCALC 123 (H) 11/08/2013   LDLCALC 125 (H) 11/08/2013   LDLDIRECT 134.0 05/11/2017   TRIG 222.0 (H) 05/11/2017   CHOLHDL 6 05/11/2017

## 2017-05-14 NOTE — Assessment & Plan Note (Signed)
His PAP results in intolerance to normotension due to bilateral carotid stenosis

## 2017-05-18 DIAGNOSIS — M47812 Spondylosis without myelopathy or radiculopathy, cervical region: Secondary | ICD-10-CM | POA: Diagnosis not present

## 2017-05-18 DIAGNOSIS — M9903 Segmental and somatic dysfunction of lumbar region: Secondary | ICD-10-CM | POA: Diagnosis not present

## 2017-05-18 DIAGNOSIS — M9901 Segmental and somatic dysfunction of cervical region: Secondary | ICD-10-CM | POA: Diagnosis not present

## 2017-05-18 DIAGNOSIS — M609 Myositis, unspecified: Secondary | ICD-10-CM | POA: Diagnosis not present

## 2017-05-18 DIAGNOSIS — M545 Low back pain: Secondary | ICD-10-CM | POA: Diagnosis not present

## 2017-05-18 DIAGNOSIS — M9902 Segmental and somatic dysfunction of thoracic region: Secondary | ICD-10-CM | POA: Diagnosis not present

## 2017-06-06 DIAGNOSIS — M609 Myositis, unspecified: Secondary | ICD-10-CM | POA: Diagnosis not present

## 2017-06-06 DIAGNOSIS — M9901 Segmental and somatic dysfunction of cervical region: Secondary | ICD-10-CM | POA: Diagnosis not present

## 2017-06-06 DIAGNOSIS — M546 Pain in thoracic spine: Secondary | ICD-10-CM | POA: Diagnosis not present

## 2017-06-06 DIAGNOSIS — M47812 Spondylosis without myelopathy or radiculopathy, cervical region: Secondary | ICD-10-CM | POA: Diagnosis not present

## 2017-06-06 DIAGNOSIS — M9903 Segmental and somatic dysfunction of lumbar region: Secondary | ICD-10-CM | POA: Diagnosis not present

## 2017-06-06 DIAGNOSIS — M9902 Segmental and somatic dysfunction of thoracic region: Secondary | ICD-10-CM | POA: Diagnosis not present

## 2017-06-08 ENCOUNTER — Telehealth: Payer: Self-pay | Admitting: Internal Medicine

## 2017-06-08 DIAGNOSIS — N184 Chronic kidney disease, stage 4 (severe): Secondary | ICD-10-CM

## 2017-06-08 NOTE — Telephone Encounter (Signed)
Please advie

## 2017-06-08 NOTE — Telephone Encounter (Signed)
Copied from [place] [id number]. Topic: Quick Communication - See Telephone Encounter >> Jun 08, 2017 10:49 AM Oneta Rack wrote: CRM for notification. See Telephone encounter for:   06/08/17.   Relation to pt: spouse / [place],Robin  Call back number:[phone]  Reason for call:  Patient last seen 05/11/17 by Dr.Tullo, spouse checking on the status of kidney referral based on 05/20/17 lab results, please advise

## 2017-06-09 NOTE — Telephone Encounter (Signed)
Was the referral put in for the kidney specialist. I'm not sure if I over looked or just didn't look in the right spot.

## 2017-06-09 NOTE — Telephone Encounter (Signed)
The referral was not placed at the time .  I have now placed it.  My apologies

## 2017-06-09 NOTE — Telephone Encounter (Signed)
Spoke with [name] to let him know that Dr. Derrel Nip was sorry that the referral didn't get placed at that time but that it has been placed now. Told [name] that if he doesn't hear from the kidney specialist by mid next week then he would need to give our office a call back. [name] gave a verbal understanding.

## 2017-06-23 DIAGNOSIS — D1800 Hemangioma unspecified site: Secondary | ICD-10-CM | POA: Diagnosis not present

## 2017-06-23 DIAGNOSIS — L812 Freckles: Secondary | ICD-10-CM | POA: Diagnosis not present

## 2017-06-23 DIAGNOSIS — L570 Actinic keratosis: Secondary | ICD-10-CM | POA: Diagnosis not present

## 2017-06-23 DIAGNOSIS — L578 Other skin changes due to chronic exposure to nonionizing radiation: Secondary | ICD-10-CM | POA: Diagnosis not present

## 2017-06-23 DIAGNOSIS — Z85828 Personal history of other malignant neoplasm of skin: Secondary | ICD-10-CM | POA: Diagnosis not present

## 2017-06-23 DIAGNOSIS — D692 Other nonthrombocytopenic purpura: Secondary | ICD-10-CM | POA: Diagnosis not present

## 2017-06-23 DIAGNOSIS — D485 Neoplasm of uncertain behavior of skin: Secondary | ICD-10-CM | POA: Diagnosis not present

## 2017-06-23 DIAGNOSIS — L719 Rosacea, unspecified: Secondary | ICD-10-CM | POA: Diagnosis not present

## 2017-06-23 DIAGNOSIS — L821 Other seborrheic keratosis: Secondary | ICD-10-CM | POA: Diagnosis not present

## 2017-06-23 DIAGNOSIS — Z1283 Encounter for screening for malignant neoplasm of skin: Secondary | ICD-10-CM | POA: Diagnosis not present

## 2017-06-23 DIAGNOSIS — D229 Melanocytic nevi, unspecified: Secondary | ICD-10-CM | POA: Diagnosis not present

## 2017-06-23 DIAGNOSIS — L853 Xerosis cutis: Secondary | ICD-10-CM | POA: Diagnosis not present

## 2017-07-04 DIAGNOSIS — M9902 Segmental and somatic dysfunction of thoracic region: Secondary | ICD-10-CM | POA: Diagnosis not present

## 2017-07-04 DIAGNOSIS — M47817 Spondylosis without myelopathy or radiculopathy, lumbosacral region: Secondary | ICD-10-CM | POA: Diagnosis not present

## 2017-07-04 DIAGNOSIS — M9901 Segmental and somatic dysfunction of cervical region: Secondary | ICD-10-CM | POA: Diagnosis not present

## 2017-07-04 DIAGNOSIS — M609 Myositis, unspecified: Secondary | ICD-10-CM | POA: Diagnosis not present

## 2017-07-04 DIAGNOSIS — M9903 Segmental and somatic dysfunction of lumbar region: Secondary | ICD-10-CM | POA: Diagnosis not present

## 2017-07-04 DIAGNOSIS — M47812 Spondylosis without myelopathy or radiculopathy, cervical region: Secondary | ICD-10-CM | POA: Diagnosis not present

## 2017-07-07 DIAGNOSIS — N2581 Secondary hyperparathyroidism of renal origin: Secondary | ICD-10-CM | POA: Diagnosis not present

## 2017-07-07 DIAGNOSIS — R809 Proteinuria, unspecified: Secondary | ICD-10-CM | POA: Diagnosis not present

## 2017-07-07 DIAGNOSIS — E1122 Type 2 diabetes mellitus with diabetic chronic kidney disease: Secondary | ICD-10-CM | POA: Diagnosis not present

## 2017-07-07 DIAGNOSIS — N184 Chronic kidney disease, stage 4 (severe): Secondary | ICD-10-CM | POA: Diagnosis not present

## 2017-07-07 DIAGNOSIS — I129 Hypertensive chronic kidney disease with stage 1 through stage 4 chronic kidney disease, or unspecified chronic kidney disease: Secondary | ICD-10-CM | POA: Diagnosis not present

## 2017-07-11 LAB — HM HEPATITIS C SCREENING LAB: HM Hepatitis Screen: NEGATIVE

## 2017-07-11 LAB — CBC AND DIFFERENTIAL
HCT: 31 — AB (ref 41–53)
Hemoglobin: 10.6 — AB (ref 13.5–17.5)
Platelets: 221 (ref 150–399)
WBC: 8.5

## 2017-07-11 LAB — VITAMIN D 25 HYDROXY (VIT D DEFICIENCY, FRACTURES): Vit D, 25-Hydroxy: 34.3

## 2017-07-11 LAB — BASIC METABOLIC PANEL
BUN: 48 — AB (ref 4–21)
Creatinine: 4.3 — AB (ref 0.6–1.3)
Glucose: 87
Potassium: 5.9 — AB (ref 3.4–5.3)
Sodium: 138 (ref 137–147)

## 2017-07-18 ENCOUNTER — Telehealth: Payer: Self-pay | Admitting: *Deleted

## 2017-07-18 NOTE — Telephone Encounter (Signed)
Copied from [place] [id number]. Topic: Appointment Scheduling - Scheduling Inquiry for Clinic >> Jul 18, 2017 12:03 PM [name] B wrote: Reason for CRM: Pts wife called to schedule the pt an apt with Dr. Derrel Nip for depression.  I do not see an open slot available other than same day slots  in the next couple weeks and couldn't reach anyone at the office.  Please call the pts wife back with an apt day and time the pt can be seen for depression. She requests the office call this number [phone]

## 2017-07-18 NOTE — Telephone Encounter (Signed)
Patient scheduled for Monday at 11.30 left message advising patient wife if not ok to call office.

## 2017-07-25 ENCOUNTER — Ambulatory Visit (INDEPENDENT_AMBULATORY_CARE_PROVIDER_SITE_OTHER): Payer: Medicare Other | Admitting: Internal Medicine

## 2017-07-25 ENCOUNTER — Ambulatory Visit (INDEPENDENT_AMBULATORY_CARE_PROVIDER_SITE_OTHER): Payer: Medicare Other

## 2017-07-25 ENCOUNTER — Encounter: Payer: Self-pay | Admitting: Internal Medicine

## 2017-07-25 VITALS — BP 168/80 | HR 68 | Temp 97.4°F | Resp 15 | Ht 66.0 in | Wt 157.6 lb

## 2017-07-25 DIAGNOSIS — J441 Chronic obstructive pulmonary disease with (acute) exacerbation: Secondary | ICD-10-CM

## 2017-07-25 DIAGNOSIS — N2581 Secondary hyperparathyroidism of renal origin: Secondary | ICD-10-CM | POA: Diagnosis not present

## 2017-07-25 DIAGNOSIS — D519 Vitamin B12 deficiency anemia, unspecified: Secondary | ICD-10-CM | POA: Diagnosis not present

## 2017-07-25 DIAGNOSIS — R0989 Other specified symptoms and signs involving the circulatory and respiratory systems: Secondary | ICD-10-CM | POA: Diagnosis not present

## 2017-07-25 DIAGNOSIS — N184 Chronic kidney disease, stage 4 (severe): Secondary | ICD-10-CM

## 2017-07-25 DIAGNOSIS — E1122 Type 2 diabetes mellitus with diabetic chronic kidney disease: Secondary | ICD-10-CM | POA: Diagnosis not present

## 2017-07-25 DIAGNOSIS — D631 Anemia in chronic kidney disease: Secondary | ICD-10-CM | POA: Diagnosis not present

## 2017-07-25 DIAGNOSIS — Z720 Tobacco use: Secondary | ICD-10-CM | POA: Diagnosis not present

## 2017-07-25 DIAGNOSIS — N2889 Other specified disorders of kidney and ureter: Secondary | ICD-10-CM | POA: Diagnosis not present

## 2017-07-25 DIAGNOSIS — I10 Essential (primary) hypertension: Secondary | ICD-10-CM | POA: Diagnosis not present

## 2017-07-25 DIAGNOSIS — R059 Cough, unspecified: Secondary | ICD-10-CM

## 2017-07-25 DIAGNOSIS — R05 Cough: Secondary | ICD-10-CM

## 2017-07-25 DIAGNOSIS — F331 Major depressive disorder, recurrent, moderate: Secondary | ICD-10-CM

## 2017-07-25 DIAGNOSIS — I129 Hypertensive chronic kidney disease with stage 1 through stage 4 chronic kidney disease, or unspecified chronic kidney disease: Secondary | ICD-10-CM | POA: Diagnosis not present

## 2017-07-25 MED ORDER — BUPROPION HCL 75 MG PO TABS: 75.0000 mg | ORAL_TABLET | Freq: Two times a day (BID) | ORAL | 0 refills | Status: DC

## 2017-07-25 MED ORDER — IPRATROPIUM-ALBUTEROL 0.5-2.5 (3) MG/3ML IN SOLN: 3.0000 mL | Freq: Four times a day (QID) | RESPIRATORY_TRACT | Status: DC

## 2017-07-25 MED ORDER — ALBUTEROL SULFATE HFA 108 (90 BASE) MCG/ACT IN AERS: 2.0000 | INHALATION_SPRAY | Freq: Four times a day (QID) | RESPIRATORY_TRACT | 0 refills | Status: DC | PRN

## 2017-07-25 MED ORDER — PREDNISONE 10 MG PO TABS: ORAL_TABLET | ORAL | 0 refills | Status: DC

## 2017-07-25 MED ORDER — LEVOFLOXACIN 500 MG PO TABS: 500.0000 mg | ORAL_TABLET | Freq: Every day | ORAL | 0 refills | Status: DC

## 2017-07-25 NOTE — Patient Instructions (Addendum)
1) Your diarrhea may be from lactose intolerance.  Try taking Lactase  (it's a pill that has the enyzme you may be  lacking that allows you to digest mik products)  Whenever  You drink [employer]ation Instant Breakfast drink   2)  I am treating you for a COPD exacerbation   If you become more short of breath than you are now,  You need to go to the nearest ER immediately or call 911  I am treating you  with the following:  Prednisone tapering dose for the next 6 days   Levaquin once daily  For   7 days (antibiotic) Use your albuterol inhaler every 6 hours as needed for wheezing or tightness in the chest  Take OTC Delsym as needed for cough   Return after 1:00 pPM today for a chest x ray,  At your leisure    Please take a probiotic ( Align, Floraque or Culturelle), or  the generic version of one of these  For a minimum of 3 weeks to prevent a serious antibiotic associated diarrhea  Called clostridium dificile colitis  .  The alternative is to eat a serving of yogurt daily with live cultures.    3)  I am starting you on wellbutrin for your depression.  Start with 1 tablet twice daily: early  morning and late afternoon  We can increase the dose from 75 mg to 150 mg after 2 full weeks IF NEEDED   4) The anemia may require iron or b12 supplements depending on the labs we are drawing today

## 2017-07-25 NOTE — Progress Notes (Signed)
Subjective:  Patient ID: [name], male    DOB: [birth year]  Age: 72 y.o. MRN: [id number]  CC: The primary encounter diagnosis was Cough. Diagnoses of Anemia due to stage 4 chronic kidney disease (HCC), Moderate episode of recurrent major depressive disorder ([place]), CKD (chronic kidney disease), stage IV ([place]), Anemia of chronic kidney failure, stage 4 (severe) ([place]), and COPD exacerbation ([place]) were also pertinent to this visit.  HPI [name] presents for management of worsening depression  (appointment requested by his wife)    CKD stage 4:  Seen in December for follow up on Type 2 DM ,  Hypertension and noted to have progressed to CKD stage 4.  Referred back to nephrology after being lost to follow up for 3 years.  Patient was advised to stop Irbesartan due to elevated potassium and rtc 2 weeks for follow p labs.  Also told he was anemic, and would need to consider starting dialysis in the future.    Still smoking.  Reporting Sinus congestion, productive cough worse at night , PND with drainage for the past 2 weeks, also having dyspnea and ears feel full   No appetite.  No conversation.  Avoiding social settings since nephrology visit. Has been having intermittent diarrhea related to use of carnation instant breakfast drinks made with cow's milk.  Outpatient Medications Prior to Visit  Medication Sig Dispense Refill  . amLODipine (NORVASC) 10 MG tablet TAKE 1 TABLET BY MOUTH DAILY 90 tablet 0  . aspirin 81 MG tablet Take 1 tablet (81 mg total) by mouth daily. 30 tablet 11  . carvedilol (COREG) 6.25 MG tablet TK 1 T PO BID  6  . diphenhydrAMINE (BENADRYL) 25 MG tablet Take 25 mg by mouth every 6 (six) hours as needed.    . gabapentin (NEURONTIN) 100 MG capsule Take 1 capsule (100 mg total) at bedtime by mouth. 90 capsule 0  . sildenafil (REVATIO) 20 MG tablet 2-5 tablets as needed 1 hour prior to intercourse    . traMADol (ULTRAM) 50 MG tablet Take 1 tablet (50 mg total) by  mouth every 6 (six) hours as needed. 120 tablet 5  . irbesartan (AVAPRO) 300 MG tablet Take 1 tablet (300 mg total) by mouth daily. (Patient not taking: Reported on 07/25/2017) 30 tablet 5   No facility-administered medications prior to visit.     Review of Systems;  Patient denies headache, fevers, malaise, unintentional weight loss, skin rash, eye pain,  sinus pain, sore throat, dysphagia,  hemoptysis , chest pain, palpitations, orthopnea, edema, abdominal pain, nausea, melena,  constipation, flank pain, dysuria, hematuria, urinary  Frequency, nocturia, numbness, tingling, seizures,  Focal weakness, Loss of consciousness,  Tremor, insomnia,  anxiety, and suicidal ideation.      Objective:  BP (!) 168/80 (BP Location: Left Arm, Patient Position: Sitting, Cuff Size: Normal)   Pulse 68   Temp (!) 97.4 F (36.3 C) (Oral)   Resp 15   Ht 5' 6"  (1.676 m)   Wt 157 lb 9.6 oz (71.5 kg)   SpO2 98%   BMI 25.44 kg/m   BP Readings from Last 3 Encounters:  07/25/17 (!) 168/80  05/11/17 (!) 190/80  04/08/17 (!) 170/78    Wt Readings from Last 3 Encounters:  07/25/17 157 lb 9.6 oz (71.5 kg)  05/11/17 160 lb 12.8 oz (72.9 kg)  04/08/17 162 lb 12 oz (73.8 kg)    General appearance: alert, cooperative and appears stated age Ears: normal TM's and external  ear canals both ears Throat: lips, mucosa, and tongue normal; teeth and gums normal Neck: no adenopathy, no carotid bruit, supple, symmetrical, trachea midline and thyroid not enlarged, symmetric, no tenderness/mass/nodules Back: symmetric, no curvature. ROM normal. No CVA tenderness. Lungs: ronchi and wheezing  bilaterally Heart: regular rate and rhythm, S1, S2 normal, no murmur, click, rub or gallop Abdomen: soft, non-tender; bowel sounds normal; no masses,  no organomegaly Pulses: 2+ and symmetric Skin: Skin color, texture, turgor normal. No rashes or lesions Lymph nodes: Cervical, supraclavicular, and axillary nodes normal. Psych:  affect sad, makes good eye contact. No fidgeting,  Speech is fluent and articulate,   Denies suicidal thoughts   Lab Results  Component Value Date   HGBA1C 5.9 05/11/2017   HGBA1C 6.1 11/09/2016   HGBA1C 5.9 05/11/2016    Lab Results  Component Value Date   CREATININE 4.3 (A) 07/11/2017   CREATININE 4.10 (H) 05/11/2017   CREATININE 3.25 (H) 11/25/2016    Lab Results  Component Value Date   WBC 8.5 07/11/2017   HGB 10.6 (A) 07/11/2017   HCT 31 (A) 07/11/2017   PLT 221 07/11/2017   GLUCOSE 99 05/11/2017   CHOL 174 05/11/2017   TRIG 222.0 (H) 05/11/2017   HDL 27.00 (L) 05/11/2017   LDLDIRECT 134.0 05/11/2017   LDLCALC 123 (H) 11/08/2013   LDLCALC 125 (H) 11/08/2013   ALT 8 05/11/2017   AST 9 05/11/2017   NA 138 07/11/2017   K 5.9 (A) 07/11/2017   CL 108 05/11/2017   CREATININE 4.3 (A) 07/11/2017   BUN 48 (A) 07/11/2017   CO2 21 05/11/2017   TSH 3.95 11/08/2013   PSA 4.34 (H) 07/26/2013   INR 0.9 01/03/2012   HGBA1C 5.9 05/11/2017   MICROALBUR 78.6 (H) 05/11/2017     Assessment & Plan:   Problem List Items Addressed This Visit    Anemia of chronic kidney failure, stage 4 (severe) ([place])    . b12 and folate stores are normal.  Multiple myeloma screen also negatve per nephrology records.   Ferritin is < 500 and TSAT is 20% so will start supplement (oral)  Once daily       Relevant Medications   ferrous sulfate 325 (65 FE) MG tablet   CKD (chronic kidney disease), stage IV (HCC)    Progressive , with new onset hyperkalemia.  Irbesartan stopped by nephrology just 2 days ago per patient . Repeat bmet two weeks       COPD exacerbation ([place])    Presumed COPD based on  Tobacco abuse longstanding,  Exam and symptoms.  Predniosne,  Levaquin,  Albuterol,  PFTs when back to baseline. Smoking cessation advised       Relevant Medications   predniSONE (DELTASONE) 10 MG tablet   albuterol (PROVENTIL HFA;VENTOLIN HFA) 108 (90 Base) MCG/ACT inhaler   ipratropium-albuterol  (DUONEB) 0.5-2.5 (3) MG/3ML nebulizer solution 3 mL   Major depressive disorder with current active episode    With weight loss due to anorexia, decreased social interaction.  Triggered by progression of CKD and general health status.  Tolerated wellbutrin in the past and may help with tobacco cessation.  Starting dose 75 mg  bid      Relevant Medications   buPROPion (WELLBUTRIN) 75 MG tablet    Other Visit Diagnoses    Cough    -  Primary   Relevant Medications   ipratropium-albuterol (DUONEB) 0.5-2.5 (3) MG/3ML nebulizer solution 3 mL   Other Relevant Orders   DG Chest  2 View (Completed)   Anemia due to stage 4 chronic kidney disease (HCC)       Relevant Orders   Iron, TIBC and Ferritin Panel (Completed)   RBC Folate (Completed)   Vitamin B12 (Completed)      I have discontinued Armstead L. Segreto's irbesartan. I am also having him start on predniSONE, levofloxacin, albuterol, buPROPion, and ferrous sulfate. Additionally, I am having him maintain his aspirin, diphenhydrAMINE, sildenafil, traMADol, gabapentin, amLODipine, and carvedilol. We will continue to administer ipratropium-albuterol.  Meds ordered this encounter  Medications  . predniSONE (DELTASONE) 10 MG tablet    Sig: 6 tablets on Day 1 , then reduce by 1 tablet daily until gone    Dispense:  21 tablet    Refill:  0  . levofloxacin (LEVAQUIN) 500 MG tablet    Sig: Take 1 tablet (500 mg total) by mouth daily.    Dispense:  7 tablet    Refill:  0  . albuterol (PROVENTIL HFA;VENTOLIN HFA) 108 (90 Base) MCG/ACT inhaler    Sig: Inhale 2 puffs into the lungs every 6 (six) hours as needed for wheezing or shortness of breath.    Dispense:  1 Inhaler    Refill:  0  . buPROPion (WELLBUTRIN) 75 MG tablet    Sig: Take 1 tablet (75 mg total) by mouth 2 (two) times daily.    Dispense:  180 tablet    Refill:  0  . ipratropium-albuterol (DUONEB) 0.5-2.5 (3) MG/3ML nebulizer solution 3 mL  . ferrous sulfate 325 (65 FE) MG tablet     Sig: Take 1 tablet (325 mg total) by mouth daily. With orange juice    Dispense:  90 tablet    Refill:  3    Medications Discontinued During This Encounter  Medication Reason  . irbesartan (AVAPRO) 300 MG tablet Discontinued by provider    Follow-up: No Follow-up on file.   Crecencio Mc, MD

## 2017-07-26 ENCOUNTER — Other Ambulatory Visit: Payer: Self-pay

## 2017-07-26 ENCOUNTER — Telehealth: Payer: Self-pay | Admitting: Internal Medicine

## 2017-07-26 DIAGNOSIS — N184 Chronic kidney disease, stage 4 (severe): Secondary | ICD-10-CM

## 2017-07-26 DIAGNOSIS — F329 Major depressive disorder, single episode, unspecified: Secondary | ICD-10-CM | POA: Insufficient documentation

## 2017-07-26 DIAGNOSIS — D631 Anemia in chronic kidney disease: Secondary | ICD-10-CM | POA: Insufficient documentation

## 2017-07-26 DIAGNOSIS — J441 Chronic obstructive pulmonary disease with (acute) exacerbation: Secondary | ICD-10-CM | POA: Insufficient documentation

## 2017-07-26 LAB — VITAMIN B12: Vitamin B-12: 976 pg/mL (ref 200–1100)

## 2017-07-26 LAB — IRON,TIBC AND FERRITIN PANEL
%SAT: 29 % (calc) (ref 15–60)
Ferritin: 200 ng/mL (ref 20–380)
Iron: 69 ug/dL (ref 50–180)
TIBC: 240 mcg/dL (calc) — ABNORMAL LOW (ref 250–425)

## 2017-07-26 LAB — FOLATE RBC: RBC Folate: 613 ng/mL RBC (ref >280)

## 2017-07-26 MED ORDER — FERROUS SULFATE 325 (65 FE) MG PO TABS: 325.0000 mg | ORAL_TABLET | Freq: Every day | ORAL | 3 refills | Status: DC

## 2017-07-26 NOTE — Assessment & Plan Note (Signed)
Presumed COPD based on  Tobacco abuse longstanding,  Exam and symptoms.  Predniosne,  Levaquin,  Albuterol,  PFTs when back to baseline. Smoking cessation advised

## 2017-07-26 NOTE — Telephone Encounter (Signed)
Correction.Marland Kitchen  His levels of iron do meet criteria for iron supplementation,  So I will send an iron supplemen to his pharmacy to take once once daily with 6 ounces of orange juice.

## 2017-07-26 NOTE — Assessment & Plan Note (Addendum)
.   b12 and folate stores are normal.  Multiple myeloma screen also negatve per nephrology records.   Ferritin is < 500 and TSAT is 20% so will start supplement (oral)  Once daily

## 2017-07-26 NOTE — Assessment & Plan Note (Signed)
With weight loss due to anorexia, decreased social interaction.  Triggered by progression of CKD and general health status.  Tolerated wellbutrin in the past and may help with tobacco cessation.  Starting dose 75 mg  bid

## 2017-07-26 NOTE — Assessment & Plan Note (Signed)
Progressive , with new onset hyperkalemia.  Irbesartan stopped by nephrology just 2 days ago per patient . Repeat bmet two weeks

## 2017-07-28 NOTE — Telephone Encounter (Signed)
LMTCB. Please transfer pt to our office.  

## 2017-07-28 NOTE — Telephone Encounter (Signed)
I spoke with patient regarding iron supplement. He verbalized understanding and stated he is ok with PFT once he is feeling better. Notified of chest xray results.

## 2017-08-03 ENCOUNTER — Other Ambulatory Visit: Payer: Self-pay | Admitting: Internal Medicine

## 2017-08-08 DIAGNOSIS — R809 Proteinuria, unspecified: Secondary | ICD-10-CM | POA: Diagnosis not present

## 2017-08-08 DIAGNOSIS — N2581 Secondary hyperparathyroidism of renal origin: Secondary | ICD-10-CM | POA: Diagnosis not present

## 2017-08-08 DIAGNOSIS — D631 Anemia in chronic kidney disease: Secondary | ICD-10-CM | POA: Diagnosis not present

## 2017-08-08 DIAGNOSIS — I120 Hypertensive chronic kidney disease with stage 5 chronic kidney disease or end stage renal disease: Secondary | ICD-10-CM | POA: Diagnosis not present

## 2017-08-08 DIAGNOSIS — N185 Chronic kidney disease, stage 5: Secondary | ICD-10-CM | POA: Diagnosis not present

## 2017-08-08 DIAGNOSIS — E875 Hyperkalemia: Secondary | ICD-10-CM | POA: Diagnosis not present

## 2017-08-16 ENCOUNTER — Telehealth: Payer: Self-pay

## 2017-08-16 ENCOUNTER — Ambulatory Visit: Payer: Medicare Other | Admitting: Internal Medicine

## 2017-08-16 NOTE — Telephone Encounter (Signed)
Copied from [place] [id number]. Topic: General - Call Back - No Documentation >> Aug 16, 2017  9:39 AM Ether Griffins B wrote: Reason for CRM: pt states he received a call from [place]

## 2017-08-18 NOTE — Telephone Encounter (Signed)
Spoke with pt and informed him that after I called him I had realized that someone else within our office had already spoken with him about his lab results and xrays results.

## 2017-08-31 DIAGNOSIS — N2889 Other specified disorders of kidney and ureter: Secondary | ICD-10-CM | POA: Diagnosis not present

## 2017-08-31 DIAGNOSIS — E1122 Type 2 diabetes mellitus with diabetic chronic kidney disease: Secondary | ICD-10-CM | POA: Diagnosis not present

## 2017-08-31 DIAGNOSIS — N2581 Secondary hyperparathyroidism of renal origin: Secondary | ICD-10-CM | POA: Diagnosis not present

## 2017-08-31 DIAGNOSIS — N184 Chronic kidney disease, stage 4 (severe): Secondary | ICD-10-CM | POA: Diagnosis not present

## 2017-08-31 DIAGNOSIS — Z720 Tobacco use: Secondary | ICD-10-CM | POA: Diagnosis not present

## 2017-08-31 DIAGNOSIS — I10 Essential (primary) hypertension: Secondary | ICD-10-CM | POA: Diagnosis not present

## 2017-08-31 DIAGNOSIS — R809 Proteinuria, unspecified: Secondary | ICD-10-CM | POA: Diagnosis not present

## 2017-09-05 DIAGNOSIS — E1122 Type 2 diabetes mellitus with diabetic chronic kidney disease: Secondary | ICD-10-CM | POA: Diagnosis not present

## 2017-09-05 DIAGNOSIS — N185 Chronic kidney disease, stage 5: Secondary | ICD-10-CM | POA: Diagnosis not present

## 2017-09-05 DIAGNOSIS — I120 Hypertensive chronic kidney disease with stage 5 chronic kidney disease or end stage renal disease: Secondary | ICD-10-CM | POA: Diagnosis not present

## 2017-09-05 DIAGNOSIS — R809 Proteinuria, unspecified: Secondary | ICD-10-CM | POA: Diagnosis not present

## 2017-09-05 DIAGNOSIS — N2581 Secondary hyperparathyroidism of renal origin: Secondary | ICD-10-CM | POA: Diagnosis not present

## 2017-09-05 DIAGNOSIS — E875 Hyperkalemia: Secondary | ICD-10-CM | POA: Diagnosis not present

## 2017-10-05 DIAGNOSIS — I10 Essential (primary) hypertension: Secondary | ICD-10-CM | POA: Diagnosis not present

## 2017-10-05 DIAGNOSIS — Z720 Tobacco use: Secondary | ICD-10-CM | POA: Diagnosis not present

## 2017-10-05 DIAGNOSIS — E1122 Type 2 diabetes mellitus with diabetic chronic kidney disease: Secondary | ICD-10-CM | POA: Diagnosis not present

## 2017-10-05 DIAGNOSIS — I129 Hypertensive chronic kidney disease with stage 1 through stage 4 chronic kidney disease, or unspecified chronic kidney disease: Secondary | ICD-10-CM | POA: Diagnosis not present

## 2017-10-05 DIAGNOSIS — N2581 Secondary hyperparathyroidism of renal origin: Secondary | ICD-10-CM | POA: Diagnosis not present

## 2017-10-05 DIAGNOSIS — N2889 Other specified disorders of kidney and ureter: Secondary | ICD-10-CM | POA: Diagnosis not present

## 2017-10-05 DIAGNOSIS — R809 Proteinuria, unspecified: Secondary | ICD-10-CM | POA: Diagnosis not present

## 2017-10-05 DIAGNOSIS — N184 Chronic kidney disease, stage 4 (severe): Secondary | ICD-10-CM | POA: Diagnosis not present

## 2017-10-10 DIAGNOSIS — E875 Hyperkalemia: Secondary | ICD-10-CM | POA: Diagnosis not present

## 2017-10-10 DIAGNOSIS — N2581 Secondary hyperparathyroidism of renal origin: Secondary | ICD-10-CM | POA: Diagnosis not present

## 2017-10-10 DIAGNOSIS — I120 Hypertensive chronic kidney disease with stage 5 chronic kidney disease or end stage renal disease: Secondary | ICD-10-CM | POA: Diagnosis not present

## 2017-10-10 DIAGNOSIS — N185 Chronic kidney disease, stage 5: Secondary | ICD-10-CM | POA: Diagnosis not present

## 2017-10-10 DIAGNOSIS — R809 Proteinuria, unspecified: Secondary | ICD-10-CM | POA: Diagnosis not present

## 2017-10-10 DIAGNOSIS — E1122 Type 2 diabetes mellitus with diabetic chronic kidney disease: Secondary | ICD-10-CM | POA: Diagnosis not present

## 2017-10-10 DIAGNOSIS — D631 Anemia in chronic kidney disease: Secondary | ICD-10-CM | POA: Diagnosis not present

## 2017-10-10 DIAGNOSIS — E872 Acidosis: Secondary | ICD-10-CM | POA: Diagnosis not present

## 2017-11-18 DIAGNOSIS — I10 Essential (primary) hypertension: Secondary | ICD-10-CM | POA: Diagnosis not present

## 2017-11-18 DIAGNOSIS — N185 Chronic kidney disease, stage 5: Secondary | ICD-10-CM | POA: Diagnosis not present

## 2017-11-18 DIAGNOSIS — Z720 Tobacco use: Secondary | ICD-10-CM | POA: Diagnosis not present

## 2017-11-18 DIAGNOSIS — N2581 Secondary hyperparathyroidism of renal origin: Secondary | ICD-10-CM | POA: Diagnosis not present

## 2017-11-18 DIAGNOSIS — E875 Hyperkalemia: Secondary | ICD-10-CM | POA: Diagnosis not present

## 2017-11-18 DIAGNOSIS — I129 Hypertensive chronic kidney disease with stage 1 through stage 4 chronic kidney disease, or unspecified chronic kidney disease: Secondary | ICD-10-CM | POA: Diagnosis not present

## 2017-11-18 DIAGNOSIS — E1122 Type 2 diabetes mellitus with diabetic chronic kidney disease: Secondary | ICD-10-CM | POA: Diagnosis not present

## 2017-11-18 DIAGNOSIS — N2889 Other specified disorders of kidney and ureter: Secondary | ICD-10-CM | POA: Diagnosis not present

## 2017-11-18 DIAGNOSIS — D631 Anemia in chronic kidney disease: Secondary | ICD-10-CM | POA: Diagnosis not present

## 2017-11-18 DIAGNOSIS — R809 Proteinuria, unspecified: Secondary | ICD-10-CM | POA: Diagnosis not present

## 2017-11-18 DIAGNOSIS — I120 Hypertensive chronic kidney disease with stage 5 chronic kidney disease or end stage renal disease: Secondary | ICD-10-CM | POA: Diagnosis not present

## 2017-11-18 DIAGNOSIS — E872 Acidosis: Secondary | ICD-10-CM | POA: Diagnosis not present

## 2017-11-23 DIAGNOSIS — I120 Hypertensive chronic kidney disease with stage 5 chronic kidney disease or end stage renal disease: Secondary | ICD-10-CM | POA: Diagnosis not present

## 2017-11-23 DIAGNOSIS — D631 Anemia in chronic kidney disease: Secondary | ICD-10-CM | POA: Diagnosis not present

## 2017-11-23 DIAGNOSIS — N2581 Secondary hyperparathyroidism of renal origin: Secondary | ICD-10-CM | POA: Diagnosis not present

## 2017-11-23 DIAGNOSIS — E875 Hyperkalemia: Secondary | ICD-10-CM | POA: Diagnosis not present

## 2017-11-23 DIAGNOSIS — E872 Acidosis: Secondary | ICD-10-CM | POA: Diagnosis not present

## 2017-11-23 DIAGNOSIS — N185 Chronic kidney disease, stage 5: Secondary | ICD-10-CM | POA: Diagnosis not present

## 2017-11-23 DIAGNOSIS — R809 Proteinuria, unspecified: Secondary | ICD-10-CM | POA: Diagnosis not present

## 2018-01-09 DIAGNOSIS — I120 Hypertensive chronic kidney disease with stage 5 chronic kidney disease or end stage renal disease: Secondary | ICD-10-CM | POA: Diagnosis not present

## 2018-01-09 DIAGNOSIS — Z720 Tobacco use: Secondary | ICD-10-CM | POA: Diagnosis not present

## 2018-01-09 DIAGNOSIS — N2889 Other specified disorders of kidney and ureter: Secondary | ICD-10-CM | POA: Diagnosis not present

## 2018-01-09 DIAGNOSIS — E1122 Type 2 diabetes mellitus with diabetic chronic kidney disease: Secondary | ICD-10-CM | POA: Diagnosis not present

## 2018-01-09 DIAGNOSIS — N185 Chronic kidney disease, stage 5: Secondary | ICD-10-CM | POA: Diagnosis not present

## 2018-01-09 DIAGNOSIS — E872 Acidosis: Secondary | ICD-10-CM | POA: Diagnosis not present

## 2018-01-09 DIAGNOSIS — D631 Anemia in chronic kidney disease: Secondary | ICD-10-CM | POA: Diagnosis not present

## 2018-01-09 DIAGNOSIS — I10 Essential (primary) hypertension: Secondary | ICD-10-CM | POA: Diagnosis not present

## 2018-01-09 DIAGNOSIS — N2581 Secondary hyperparathyroidism of renal origin: Secondary | ICD-10-CM | POA: Diagnosis not present

## 2018-01-09 DIAGNOSIS — R809 Proteinuria, unspecified: Secondary | ICD-10-CM | POA: Diagnosis not present

## 2018-01-09 DIAGNOSIS — E875 Hyperkalemia: Secondary | ICD-10-CM | POA: Diagnosis not present

## 2018-01-09 DIAGNOSIS — I129 Hypertensive chronic kidney disease with stage 1 through stage 4 chronic kidney disease, or unspecified chronic kidney disease: Secondary | ICD-10-CM | POA: Diagnosis not present

## 2018-01-12 DIAGNOSIS — D631 Anemia in chronic kidney disease: Secondary | ICD-10-CM | POA: Diagnosis not present

## 2018-01-12 DIAGNOSIS — E875 Hyperkalemia: Secondary | ICD-10-CM | POA: Diagnosis not present

## 2018-01-12 DIAGNOSIS — E872 Acidosis: Secondary | ICD-10-CM | POA: Diagnosis not present

## 2018-01-12 DIAGNOSIS — N185 Chronic kidney disease, stage 5: Secondary | ICD-10-CM | POA: Diagnosis not present

## 2018-01-12 DIAGNOSIS — N2581 Secondary hyperparathyroidism of renal origin: Secondary | ICD-10-CM | POA: Diagnosis not present

## 2018-01-12 DIAGNOSIS — R809 Proteinuria, unspecified: Secondary | ICD-10-CM | POA: Diagnosis not present

## 2018-01-12 DIAGNOSIS — I120 Hypertensive chronic kidney disease with stage 5 chronic kidney disease or end stage renal disease: Secondary | ICD-10-CM | POA: Diagnosis not present

## 2018-01-16 DIAGNOSIS — N185 Chronic kidney disease, stage 5: Secondary | ICD-10-CM | POA: Diagnosis not present

## 2018-01-16 DIAGNOSIS — N2581 Secondary hyperparathyroidism of renal origin: Secondary | ICD-10-CM | POA: Diagnosis not present

## 2018-01-16 DIAGNOSIS — R809 Proteinuria, unspecified: Secondary | ICD-10-CM | POA: Diagnosis not present

## 2018-01-16 DIAGNOSIS — D631 Anemia in chronic kidney disease: Secondary | ICD-10-CM | POA: Diagnosis not present

## 2018-01-16 DIAGNOSIS — I120 Hypertensive chronic kidney disease with stage 5 chronic kidney disease or end stage renal disease: Secondary | ICD-10-CM | POA: Diagnosis not present

## 2018-01-16 DIAGNOSIS — E872 Acidosis: Secondary | ICD-10-CM | POA: Diagnosis not present

## 2018-01-16 DIAGNOSIS — E875 Hyperkalemia: Secondary | ICD-10-CM | POA: Diagnosis not present

## 2018-02-08 DIAGNOSIS — I120 Hypertensive chronic kidney disease with stage 5 chronic kidney disease or end stage renal disease: Secondary | ICD-10-CM | POA: Diagnosis not present

## 2018-02-08 DIAGNOSIS — E1122 Type 2 diabetes mellitus with diabetic chronic kidney disease: Secondary | ICD-10-CM | POA: Diagnosis not present

## 2018-02-08 DIAGNOSIS — N2889 Other specified disorders of kidney and ureter: Secondary | ICD-10-CM | POA: Diagnosis not present

## 2018-02-08 DIAGNOSIS — I10 Essential (primary) hypertension: Secondary | ICD-10-CM | POA: Diagnosis not present

## 2018-02-08 DIAGNOSIS — E875 Hyperkalemia: Secondary | ICD-10-CM | POA: Diagnosis not present

## 2018-02-08 DIAGNOSIS — D631 Anemia in chronic kidney disease: Secondary | ICD-10-CM | POA: Diagnosis not present

## 2018-02-08 DIAGNOSIS — N185 Chronic kidney disease, stage 5: Secondary | ICD-10-CM | POA: Diagnosis not present

## 2018-02-08 DIAGNOSIS — Z720 Tobacco use: Secondary | ICD-10-CM | POA: Diagnosis not present

## 2018-02-08 DIAGNOSIS — N2581 Secondary hyperparathyroidism of renal origin: Secondary | ICD-10-CM | POA: Diagnosis not present

## 2018-02-08 DIAGNOSIS — R809 Proteinuria, unspecified: Secondary | ICD-10-CM | POA: Diagnosis not present

## 2018-02-08 DIAGNOSIS — E872 Acidosis: Secondary | ICD-10-CM | POA: Diagnosis not present

## 2018-02-08 DIAGNOSIS — I129 Hypertensive chronic kidney disease with stage 1 through stage 4 chronic kidney disease, or unspecified chronic kidney disease: Secondary | ICD-10-CM | POA: Diagnosis not present

## 2018-02-13 DIAGNOSIS — E872 Acidosis: Secondary | ICD-10-CM | POA: Diagnosis not present

## 2018-02-13 DIAGNOSIS — N2581 Secondary hyperparathyroidism of renal origin: Secondary | ICD-10-CM | POA: Diagnosis not present

## 2018-02-13 DIAGNOSIS — I120 Hypertensive chronic kidney disease with stage 5 chronic kidney disease or end stage renal disease: Secondary | ICD-10-CM | POA: Diagnosis not present

## 2018-02-13 DIAGNOSIS — E875 Hyperkalemia: Secondary | ICD-10-CM | POA: Diagnosis not present

## 2018-02-13 DIAGNOSIS — R809 Proteinuria, unspecified: Secondary | ICD-10-CM | POA: Diagnosis not present

## 2018-02-13 DIAGNOSIS — N185 Chronic kidney disease, stage 5: Secondary | ICD-10-CM | POA: Diagnosis not present

## 2018-03-23 ENCOUNTER — Other Ambulatory Visit: Payer: Self-pay | Admitting: Internal Medicine

## 2018-04-10 ENCOUNTER — Ambulatory Visit (INDEPENDENT_AMBULATORY_CARE_PROVIDER_SITE_OTHER): Payer: Medicare Other

## 2018-04-10 VITALS — BP 180/82 | HR 62 | Temp 97.9°F | Resp 15 | Ht 66.0 in | Wt 145.8 lb

## 2018-04-10 DIAGNOSIS — Z0000 Encounter for general adult medical examination without abnormal findings: Secondary | ICD-10-CM | POA: Diagnosis not present

## 2018-04-10 NOTE — Patient Instructions (Addendum)
[name] , Thank you for taking time to come for your Medicare Wellness Visit. I appreciate your ongoing commitment to your health goals. Please review the following plan we discussed and let me know if I can assist you in the future.   These are the goals we discussed: Goals    . Increase physical activity     Strength exercises       This is a list of the screening recommended for you and due dates:  Health Maintenance  Topic Date Due  . Eye exam for diabetics  09/09/2014  . Colon Cancer Screening  05/24/2016  . Complete foot exam   07/17/2016  . Hemoglobin A1C  11/09/2017  . Tetanus Vaccine  02/04/2019  .  Hepatitis C: One time screening is recommended by Center for Disease Control  (CDC) for  adults born from [birth year] through 1965.   Completed  . Pneumonia vaccines  Completed  . Flu Shot  Discontinued    Colonoscopy, Adult A colonoscopy is an exam to look at the entire large intestine. During the exam, a lubricated, bendable tube is inserted into the anus and then passed into the rectum, colon, and other parts of the large intestine. A colonoscopy is often done as a part of normal colorectal screening or in response to certain symptoms, such as anemia, persistent diarrhea, abdominal pain, and blood in the stool. The exam can help screen for and diagnose medical problems, including:  Tumors.  Polyps.  Inflammation.  Areas of bleeding.  Tell a health care provider about:  Any allergies you have.  All medicines you are taking, including vitamins, herbs, eye drops, creams, and over-the-counter medicines.  Any problems you or family members have had with anesthetic medicines.  Any blood disorders you have.  Any surgeries you have had.  Any medical conditions you have.  Any problems you have had passing stool. What are the risks? Generally, this is a safe procedure. However, problems may occur, including:  Bleeding.  A tear in the intestine.  A reaction to  medicines given during the exam.  Infection (rare).  What happens before the procedure? Eating and drinking restrictions Follow instructions from your health care provider about eating and drinking, which may include:  A few days before the procedure - follow a low-fiber diet. Avoid nuts, seeds, dried fruit, raw fruits, and vegetables.  1-3 days before the procedure - follow a clear liquid diet. Drink only clear liquids, such as clear broth or bouillon, black coffee or tea, clear juice, clear soft drinks or sports drinks, gelatin dessert, and popsicles. Avoid any liquids that contain red or purple dye.  On the day of the procedure - do not eat or drink anything during the 2 hours before the procedure, or within the time period that your health care provider recommends.  Bowel prep If you were prescribed an oral bowel prep to clean out your colon:  Take it as told by your health care provider. Starting the day before your procedure, you will need to drink a large amount of medicated liquid. The liquid will cause you to have multiple loose stools until your stool is almost clear or light green.  If your skin or anus gets irritated from diarrhea, you may use these to relieve the irritation: ? Medicated wipes, such as adult wet wipes with aloe and vitamin E. ? A skin soothing-product like petroleum jelly.  If you vomit while drinking the bowel prep, take a break for up to  60 minutes and then begin the bowel prep again. If vomiting continues and you cannot take the bowel prep without vomiting, call your health care provider.  General instructions  Ask your health care provider about changing or stopping your regular medicines. This is especially important if you are taking diabetes medicines or blood thinners.  Plan to have someone take you home from the hospital or clinic. What happens during the procedure?  An IV tube may be inserted into one of your veins.  You will be given medicine  to help you relax (sedative).  To reduce your risk of infection: ? Your health care team will wash or sanitize their hands. ? Your anal area will be washed with soap.  You will be asked to lie on your side with your knees bent.  Your health care provider will lubricate a long, thin, flexible tube. The tube will have a camera and a light on the end.  The tube will be inserted into your anus.  The tube will be gently eased through your rectum and colon.  Air will be delivered into your colon to keep it open. You may feel some pressure or cramping.  The camera will be used to take images during the procedure.  A small tissue sample may be removed from your body to be examined under a microscope (biopsy). If any potential problems are found, the tissue will be sent to a lab for testing.  If small polyps are found, your health care provider may remove them and have them checked for cancer cells.  The tube that was inserted into your anus will be slowly removed. The procedure may vary among health care providers and hospitals. What happens after the procedure?  Your blood pressure, heart rate, breathing rate, and blood oxygen level will be monitored until the medicines you were given have worn off.  Do not drive for 24 hours after the exam.  You may have a small amount of blood in your stool.  You may pass gas and have mild abdominal cramping or bloating due to the air that was used to inflate your colon during the exam.  It is up to you to get the results of your procedure. Ask your health care provider, or the department performing the procedure, when your results will be ready. This information is not intended to replace advice given to you by your health care provider. Make sure you discuss any questions you have with your health care provider. Document Released: 05/07/2000 Document Revised: 03/10/2016 Document Reviewed: 07/22/2015 Elsevier Interactive Patient Education  2018  [employer].

## 2018-04-10 NOTE — Progress Notes (Signed)
Subjective:   [name] is a 72 y.o. male who presents for Medicare Annual/Subsequent preventive examination.  Review of Systems:  No ROS.  Medicare Wellness Visit. Additional risk factors are reflected in the social history. Cardiac Risk Factors include: advanced age (>19men, >81 women);hypertension;male gender;diabetes mellitus     Objective:    Vitals: BP (!) 180/82 (BP Location: Left Arm, Patient Position: Sitting, Cuff Size: Normal)   Pulse 62   Temp 97.9 F (36.6 C) (Oral)   Resp 15   Ht 5\' 6"  (1.676 m)   Wt 145 lb 12.8 oz (66.1 kg)   SpO2 98%   BMI 23.53 kg/m   Body mass index is 23.53 kg/m.  Advanced Directives 04/10/2018 04/08/2017 04/07/2016  Does Patient Have a Medical Advance Directive? Yes Yes Yes  Type of Advance Directive Living will Living will;Healthcare Power of [place];Living will  Does patient want to make changes to medical advance directive? No - Patient declined No - Patient declined -  Copy of [place] in Chart? - No - copy requested No - copy requested    Tobacco Social History   Tobacco Use  Smoking Status Current Every Day Smoker  . Packs/day: 1.50  . Types: Cigarettes  Smokeless Tobacco Never Used     Ready to quit: Not Answered Counseling given: Not Answered   Clinical Intake:  Pre-visit preparation completed: Yes  Pain : No/denies pain     Nutritional Status: BMI of 19-24  Normal Diabetes: Yes(Followed by pcp)  How often do you need to have someone help you when you read instructions, pamphlets, or other written materials from your doctor or pharmacy?: 1 - Never  Interpreter Needed?: No     Past Medical History:  Diagnosis Date  . 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 Hyperlipidemia   . Hypertension   . Migraine    cluster  . Obstructive sleep apnea   . PAD (peripheral artery disease) [facility]) Feb 2009   nonobstructing,  renal angiogram ([place])  . Renal cell carcinoma 2004   left kidney heminephrectomy  . TIA (transient ischemic attack) Aug 2013  . tobacco abuse    Past Surgical History:  Procedure Laterality Date  . CARDIAC CATHETERIZATION     Dr. [name] [facility])   . cyst removal  12/25/2015   Spine L4 and L5  . heminephrectomy  2004   for renal cell CA  . RENAL CRYOABLATION  Jan 2006   right kidney,  [name]  . sciatica     Family History  Problem Relation Age of Onset  . Hypertension Mother   . Cancer Mother        breast  . Aneurysm Mother   . Coronary artery disease Father   . Hypertension Father   . Stroke Father   . Aneurysm Maternal Grandmother        brain  . Aneurysm Paternal Grandmother        brain  . Coronary artery disease Paternal Grandfather   . Heart disease Brother   . COPD Brother   . Hypertension Brother   . Stroke Paternal Uncle    Social History   Socioeconomic History  . Marital status: Married    Spouse name: Not on file  . Number of children: Not on file  . Years of education: Not on file  . Highest education level: Not on file  Occupational History  .  Not on file  Social Needs  . Financial resource strain: Not hard at all  . Food insecurity:    Worry: Never true    Inability: Never true  . Transportation needs:    Medical: No    Non-medical: No  Tobacco Use  . Smoking status: Current Every Day Smoker    Packs/day: 1.50    Types: Cigarettes  . Smokeless tobacco: Never Used  Substance and Sexual Activity  . Alcohol use: Yes    Alcohol/week: 2.0 standard drinks    Types: 1 Cans of beer, 1 Shots of liquor per week    Comment: social  . Drug use: No  . Sexual activity: Yes  Lifestyle  . Physical activity:    Days per week: 3 days    Minutes per session: 20 min  . Stress: Only a little  Relationships  . Social connections:    Talks on phone: Not on file    Gets together: Not on file    Attends religious service: Not on file     Active member of club or organization: Not on file    Attends meetings of clubs or organizations: Not on file    Relationship status: Married  Other Topics Concern  . Not on file  Social History Narrative  . Not on file    Outpatient Encounter Medications as of 04/10/2018  Medication Sig  . albuterol (PROVENTIL HFA;VENTOLIN HFA) 108 (90 Base) MCG/ACT inhaler Inhale 2 puffs into the lungs every 6 (six) hours as needed for wheezing or shortness of breath.  Marland Kitchen amLODipine (NORVASC) 10 MG tablet TAKE 1 TABLET BY MOUTH DAILY  . aspirin 81 MG tablet Take 1 tablet (81 mg total) by mouth daily.  Marland Kitchen buPROPion (WELLBUTRIN) 75 MG tablet Take 1 tablet (75 mg total) by mouth 2 (two) times daily.  . carvedilol (COREG) 6.25 MG tablet TK 1 T PO BID  . diphenhydrAMINE (BENADRYL) 25 MG tablet Take 25 mg by mouth every 6 (six) hours as needed.  . ferrous sulfate 325 (65 FE) MG tablet Take 1 tablet (325 mg total) by mouth daily. With orange juice  . gabapentin (NEURONTIN) 100 MG capsule TAKE ONE CAPSULE BY MOUTH ONCE DAILY AT BEDTIME  . sildenafil (REVATIO) 20 MG tablet 2-5 tablets as needed 1 hour prior to intercourse  . [DISCONTINUED] gabapentin (NEURONTIN) 100 MG capsule TAKE 1 CAPSULE BY MOUTH AT BEDTIME  . [DISCONTINUED] levofloxacin (LEVAQUIN) 500 MG tablet Take 1 tablet (500 mg total) by mouth daily.  . [DISCONTINUED] predniSONE (DELTASONE) 10 MG tablet 6 tablets on Day 1 , then reduce by 1 tablet daily until gone  . [DISCONTINUED] traMADol (ULTRAM) 50 MG tablet Take 1 tablet (50 mg total) by mouth every 6 (six) hours as needed.  . calcitRIOL (ROCALTROL) 0.25 MCG capsule   . cloNIDine (CATAPRES) 0.1 MG tablet TK 1 T PO TID  . sodium bicarbonate 325 MG tablet TK 1 T PO BID   Facility-Administered Encounter Medications as of 04/10/2018  Medication  . ipratropium-albuterol (DUONEB) 0.5-2.5 (3) MG/3ML nebulizer solution 3 mL    Activities of Daily Living In your present state of health, do you have any  difficulty performing the following activities: 04/10/2018  Hearing? Y  Comment Hearing aid, bilateral  Vision? N  Difficulty concentrating or making decisions? N  Walking or climbing stairs? Y  Comment Low back pain when walking long distances. No medicine taken for pain.   Dressing or bathing? N  Doing errands, shopping? N  Preparing Food and eating ? N  Using the Toilet? N  In the past six months, have you accidently leaked urine? N  Do you have problems with loss of bowel control? N  Managing your Medications? N  Managing your Finances? N  Housekeeping or managing your Housekeeping? N  Some recent data might be hidden    Patient Care Team: Crecencio Mc, MD as PCP - General (Internal Medicine)   Assessment:   This is a routine wellness examination for Sawyer.  The goal of the wellness visit is to assist the patient how to close the gaps in care and create a preventative care plan for the patient.   The roster of all physicians providing medical care to patient is listed in the Snapshot section of the chart.  Osteoporosis risk reviewed.    Safety issues reviewed; Smoke and carbon monoxide detectors in the home. Firearm safety discussed. Wears seatbelts when driving or riding with others. No violence in the home.  They do not have excessive sun exposure.  Discussed the need for sun protection: hats, long sleeves and the use of sunscreen if there is significant sun exposure.  Patient is alert, normal appearance, oriented to person/place/and time.  Correctly identified the president of the [place] and recalls of 2/3 words. Performs simple calculations and can read correct time from watch face.  Displays appropriate judgement.  No new identified risk were noted.  No failures at ADL's or IADL's.    BMI- discussed the importance of a healthy diet, water intake and the benefits of aerobic exercise. He has a healthy diet, active around the home with yard work and has adequate  water intake.   24 hour diet recall: Healthy diet. Monitors his diet closely and notes it has helped stabilize kidney readings.   Dental- every 4 months.  Sleep patterns- Sleeps fair.   Colonoscopy and Cologuard discussed. Educational material provided. Deferred per patient request.   Influenza vaccine discontinued per patient request. Past history of swollen glands and hard nodules in the arm pit area.   Patient Concerns: None at this time. Follow up scheduled with PCP for routine maintenance.   Exercise Activities and Dietary recommendations Current Exercise Habits: Home exercise routine, Time (Minutes): 20, Frequency (Times/Week): 3, Weekly Exercise (Minutes/Week): 60, Intensity: Mild  Goals    . Increase physical activity     Strength exercises       Fall Risk Fall Risk  04/10/2018 04/08/2017 05/11/2016 04/07/2016 01/19/2016  Falls in the past year? 0 No No No Yes  Comment - - - - Emmi Telephone Survey: data to providers prior to load  Number falls in past yr: - - - - 1  Comment - - - - Emmi Telephone Survey Actual Response = 1  Injury with Fall? - - - - No   Depression Screen PHQ 2/9 Scores 04/10/2018 04/08/2017 05/11/2016 04/07/2016  PHQ - 2 Score 0 0 0 0  PHQ- 9 Score - 0 - -    Cognitive Function     6CIT Screen 04/10/2018 04/08/2017 04/07/2016  What Year? 0 points 0 points 0 points  What month? 0 points 0 points 0 points  What time? 0 points - 0 points  Count back from 20 0 points - 0 points  Months in reverse 0 points - 0 points    Immunization History  Administered Date(s) Administered  . Pneumococcal Conjugate-13 02/15/2014  . Pneumococcal Polysaccharide-23 01/23/2013  . Tdap 02/03/2009   Screening Tests Health Maintenance  Topic Date Due  . OPHTHALMOLOGY EXAM  09/09/2014  . COLONOSCOPY  05/24/2016  . FOOT EXAM  07/17/2016  . HEMOGLOBIN A1C  11/09/2017  . TETANUS/TDAP  02/04/2019  . Hepatitis C Screening  Completed  . PNA vac Low Risk  Adult  Completed  . INFLUENZA VACCINE  Discontinued        Plan:    End of life planning; Advance aging; Advanced directives discussed. Copy of current HCPOA/Living Will requested.     I have personally reviewed and noted the following in the patient's chart:   . Medical and social history . Use of alcohol, tobacco or illicit drugs  . Current medications and supplements . Functional ability and status . Nutritional status . Physical activity . Advanced directives . List of other physicians . Hospitalizations, surgeries, and ER visits in previous 12 months . Vitals . Screenings to include cognitive, depression, and falls . Referrals and appointments  In addition, I have reviewed and discussed with patient certain preventive protocols, quality metrics, and best practice recommendations. A written personalized care plan for preventive services as well as general preventive health recommendations were provided to patient.     OBrien-Blaney, Jaggar Benko L, LPN  22/29/7989   I have reviewed the above information and agree with above.   Deborra Medina, MD

## 2018-04-19 DIAGNOSIS — I10 Essential (primary) hypertension: Secondary | ICD-10-CM | POA: Diagnosis not present

## 2018-04-19 DIAGNOSIS — R809 Proteinuria, unspecified: Secondary | ICD-10-CM | POA: Diagnosis not present

## 2018-04-19 DIAGNOSIS — E1122 Type 2 diabetes mellitus with diabetic chronic kidney disease: Secondary | ICD-10-CM | POA: Diagnosis not present

## 2018-04-19 DIAGNOSIS — I120 Hypertensive chronic kidney disease with stage 5 chronic kidney disease or end stage renal disease: Secondary | ICD-10-CM | POA: Diagnosis not present

## 2018-04-19 DIAGNOSIS — E875 Hyperkalemia: Secondary | ICD-10-CM | POA: Diagnosis not present

## 2018-04-19 DIAGNOSIS — N2581 Secondary hyperparathyroidism of renal origin: Secondary | ICD-10-CM | POA: Diagnosis not present

## 2018-04-19 DIAGNOSIS — I129 Hypertensive chronic kidney disease with stage 1 through stage 4 chronic kidney disease, or unspecified chronic kidney disease: Secondary | ICD-10-CM | POA: Diagnosis not present

## 2018-04-19 DIAGNOSIS — Z720 Tobacco use: Secondary | ICD-10-CM | POA: Diagnosis not present

## 2018-04-19 DIAGNOSIS — E872 Acidosis: Secondary | ICD-10-CM | POA: Diagnosis not present

## 2018-04-19 DIAGNOSIS — N2889 Other specified disorders of kidney and ureter: Secondary | ICD-10-CM | POA: Diagnosis not present

## 2018-04-19 DIAGNOSIS — N185 Chronic kidney disease, stage 5: Secondary | ICD-10-CM | POA: Diagnosis not present

## 2018-04-19 DIAGNOSIS — D631 Anemia in chronic kidney disease: Secondary | ICD-10-CM | POA: Diagnosis not present

## 2018-04-19 LAB — CBC AND DIFFERENTIAL
HCT: 27 — AB (ref 39–52)
Hemoglobin: 9.1 — AB (ref 13.0–17.0)
Platelets: 211 (ref 150–399)

## 2018-04-19 LAB — BASIC METABOLIC PANEL
BUN: 44 — AB (ref 4–21)
Creatinine: 4.6
Potassium: 4.5 (ref 3.4–5.3)
Sodium: 136 — AB (ref 137–147)

## 2018-04-24 DIAGNOSIS — E872 Acidosis: Secondary | ICD-10-CM | POA: Diagnosis not present

## 2018-04-24 DIAGNOSIS — D631 Anemia in chronic kidney disease: Secondary | ICD-10-CM | POA: Diagnosis not present

## 2018-04-24 DIAGNOSIS — R809 Proteinuria, unspecified: Secondary | ICD-10-CM | POA: Diagnosis not present

## 2018-04-24 DIAGNOSIS — I120 Hypertensive chronic kidney disease with stage 5 chronic kidney disease or end stage renal disease: Secondary | ICD-10-CM | POA: Diagnosis not present

## 2018-04-24 DIAGNOSIS — N2581 Secondary hyperparathyroidism of renal origin: Secondary | ICD-10-CM | POA: Diagnosis not present

## 2018-04-24 DIAGNOSIS — N185 Chronic kidney disease, stage 5: Secondary | ICD-10-CM | POA: Diagnosis not present

## 2018-05-12 ENCOUNTER — Encounter: Payer: Self-pay | Admitting: Internal Medicine

## 2018-05-29 ENCOUNTER — Telehealth: Payer: Self-pay | Admitting: *Deleted

## 2018-05-29 ENCOUNTER — Encounter: Payer: Self-pay | Admitting: Internal Medicine

## 2018-05-29 ENCOUNTER — Ambulatory Visit (INDEPENDENT_AMBULATORY_CARE_PROVIDER_SITE_OTHER): Payer: Medicare Other | Admitting: Internal Medicine

## 2018-05-29 VITALS — BP 162/66 | HR 56 | Temp 97.6°F | Resp 15 | Ht 66.0 in | Wt 151.8 lb

## 2018-05-29 DIAGNOSIS — E7800 Pure hypercholesterolemia, unspecified: Secondary | ICD-10-CM | POA: Diagnosis not present

## 2018-05-29 DIAGNOSIS — E1121 Type 2 diabetes mellitus with diabetic nephropathy: Secondary | ICD-10-CM

## 2018-05-29 DIAGNOSIS — I129 Hypertensive chronic kidney disease with stage 1 through stage 4 chronic kidney disease, or unspecified chronic kidney disease: Secondary | ICD-10-CM | POA: Diagnosis not present

## 2018-05-29 DIAGNOSIS — N184 Chronic kidney disease, stage 4 (severe): Secondary | ICD-10-CM

## 2018-05-29 DIAGNOSIS — Z23 Encounter for immunization: Secondary | ICD-10-CM

## 2018-05-29 DIAGNOSIS — C642 Malignant neoplasm of left kidney, except renal pelvis: Secondary | ICD-10-CM | POA: Diagnosis not present

## 2018-05-29 LAB — LIPID PANEL
Cholesterol: 160 mg/dL (ref 0–200)
HDL: 24.4 mg/dL — ABNORMAL LOW (ref >39.00)
LDL Cholesterol: 101 mg/dL — ABNORMAL HIGH (ref 0–99)
NonHDL: 136.04
Total CHOL/HDL Ratio: 7
Triglycerides: 177 mg/dL — ABNORMAL HIGH (ref 0.0–149.0)
VLDL: 35.4 mg/dL (ref 0.0–40.0)

## 2018-05-29 LAB — HEMOGLOBIN A1C: Hgb A1c MFr Bld: 5.9 % (ref 4.6–6.5)

## 2018-05-29 LAB — COMPREHENSIVE METABOLIC PANEL
ALT: 7 U/L (ref 0–53)
AST: 9 U/L (ref 0–37)
Albumin: 4 g/dL (ref 3.5–5.2)
Alkaline Phosphatase: 101 U/L (ref 39–117)
BUN: 49 mg/dL — ABNORMAL HIGH (ref 6–23)
CO2: 22 mEq/L (ref 19–32)
Calcium: 9.1 mg/dL (ref 8.4–10.5)
Chloride: 107 mEq/L (ref 96–112)
Creatinine, Ser: 4.96 mg/dL (ref 0.40–1.50)
GFR: 12.28 mL/min — CL (ref >60.00)
Glucose, Bld: 95 mg/dL (ref 70–99)
Potassium: 4.6 mEq/L (ref 3.5–5.1)
Sodium: 135 mEq/L (ref 135–145)
Total Bilirubin: 0.5 mg/dL (ref 0.2–1.2)
Total Protein: 7 g/dL (ref 6.0–8.3)

## 2018-05-29 MED ORDER — HYDRALAZINE HCL 25 MG PO TABS: 25.0000 mg | ORAL_TABLET | Freq: Three times a day (TID) | ORAL | 1 refills | Status: DC

## 2018-05-29 MED ORDER — FLUTICASONE PROPIONATE 50 MCG/ACT NA SUSP: 2.0000 | Freq: Every day | NASAL | 6 refills | Status: DC

## 2018-05-29 NOTE — Patient Instructions (Addendum)
Your increased "gas" may be due to increased  Mouth breaking.  This causes more air intake when you open your mouth to eat .  Trial  of Flonase for chronic sinus congestion   Try taking Gas x two times daily .  (OR PHASYZME)  Both contain simethicone for the gas buildup   You can also add Beano .  Take it at meals containing beans or cruciferous vegetables (broccoli, cauliflower, brussels sprouts)     your blood pressure is still too high,  So I am adding hydralazine  3 TIMES DAILY (BREAKFAST, DINNER AND BEDTIME)    You received your final Pneumovax vaccine today

## 2018-05-29 NOTE — Telephone Encounter (Signed)
CRITICAL VALUE STICKER  CRITICAL VALUE: Creatinine-4.96 & GFR-12.28  RECEIVER (on-site recipient of call):Jari Favre, CMA/XT  DATE & TIME NOTIFIED: 05/29/18 @ 3:15pm  MESSENGER (representative from lab): Hope  MD NOTIFIED: Dr. Derrel Nip  TIME OF NOTIFICATION:3:20pm  RESPONSE: see phone note or lab result

## 2018-05-29 NOTE — Telephone Encounter (Signed)
Received .  CR is stable.

## 2018-05-29 NOTE — Progress Notes (Signed)
Subjective:  Patient ID: [name], adult    DOB: 04-14-1946  Age: 73 y.o. MRN: [id number]  CC: The primary encounter diagnosis was Type 2 diabetes mellitus with diabetic nephropathy, without long-term current use of insulin ([place]). Diagnoses of Need for 23-polyvalent pneumococcal polysaccharide vaccine, Renal cell carcinoma of left kidney (HCC), Pure hypercholesterolemia, Renal sclerosis with hypertension, stage 1 through stage 4 or unspecified chronic kidney disease, and CKD (chronic kidney disease), stage IV ([place]) were also pertinent to this visit.  HPI [name] presents for follow up on Type 2 DM, uncontrolled hypertension,  Hyperlipidemia, and CKD Stage 4 . Last seen one year ago   Cc: "everything gives me gas"  Bothered by increased flatus, but not constipated.  Has 1-2 BMs daily ,  Solid mostly .  mOuth breathes; has had sinus surgery andis chronically congested.    2) CJD Stage 4: Saw [facility] in [place] disease progression,  6 month follow up.  Willing to consider dialysis if recommended.  [place] changed BP medications: .  Stopped irbesartan and started carvedilol and clonidine.  Notes decreased energy and increased sleepiness since medication  change but denies dizziness and hypersomnia/driving issues 3) Has gained 4 lbs since last visit  . Grazes a lot all day long.    3) Tobacco abuse Smoking 1.5 packs daily .      Lab Results  Component Value Date   HGBA1C 5.9 05/29/2018       Outpatient Medications Prior to Visit  Medication Sig Dispense Refill  . albuterol (PROVENTIL HFA;VENTOLIN HFA) 108 (90 Base) MCG/ACT inhaler Inhale 2 puffs into the lungs every 6 (six) hours as needed for wheezing or shortness of breath. 1 Inhaler 0  . amLODipine (NORVASC) 10 MG tablet TAKE 1 TABLET BY MOUTH DAILY 90 tablet 1  . aspirin 81 MG tablet Take 1 tablet (81 mg total) by mouth daily. 30 tablet 11  . buPROPion (WELLBUTRIN) 75 MG tablet Take 1 tablet (75 mg total) by mouth 2 (two)  times daily. 180 tablet 0  . calcitRIOL (ROCALTROL) 0.25 MCG capsule   3  . carvedilol (COREG) 6.25 MG tablet TK 1 T PO BID  6  . cloNIDine (CATAPRES) 0.1 MG tablet TK 1 T PO TID  3  . diphenhydrAMINE (BENADRYL) 25 MG tablet Take 25 mg by mouth every 6 (six) hours as needed.    . ferrous sulfate 325 (65 FE) MG tablet Take 1 tablet (325 mg total) by mouth daily. With orange juice 90 tablet 3  . gabapentin (NEURONTIN) 100 MG capsule TAKE ONE CAPSULE BY MOUTH ONCE DAILY AT BEDTIME 90 capsule 0  . sildenafil (REVATIO) 20 MG tablet 2-5 tablets as needed 1 hour prior to intercourse    . sodium bicarbonate 325 MG tablet TK 1 T PO BID  1   Facility-Administered Medications Prior to Visit  Medication Dose Route Frequency Provider Last Rate Last Dose  . ipratropium-albuterol (DUONEB) 0.5-2.5 (3) MG/3ML nebulizer solution 3 mL  3 mL Nebulization Q6H Crecencio Mc, MD        Review of Systems;  Patient denies headache, fevers, malaise, unintentional weight loss, skin rash, eye pain,  sinus pain, sore throat ,  dysphagia,  hemoptysis , cough, dyspnea, wheezing, chest pain, palpitations, orthopnea, edema, abdominal pain, nausea, melena, diarrhea, constipation, flank pain, dysuria, hematuria, urinary  Frequency, nocturia, numbness, tingling, seizures,  Focal weakness, Loss of consciousness,  Tremor, insomnia, depression, anxiety, and suicidal ideation.  Objective:  BP (!) 162/66 (BP Location: Right Arm, Patient Position: Sitting, Cuff Size: Normal)   Pulse (!) 56   Temp 97.6 F (36.4 C) (Oral)   Resp 15   Ht 5\' 6"  (1.676 m)   Wt 151 lb 12.8 oz (68.9 kg)   SpO2 99%   BMI 24.50 kg/m   BP Readings from Last 3 Encounters:  05/29/18 (!) 162/66  04/10/18 (!) 180/82  07/25/17 (!) 168/80    Wt Readings from Last 3 Encounters:  05/29/18 151 lb 12.8 oz (68.9 kg)  04/10/18 145 lb 12.8 oz (66.1 kg)  07/25/17 157 lb 9.6 oz (71.5 kg)    General appearance: alert, cooperative and appears stated  age Ears: normal TM's and external ear canals both ears Throat: lips, mucosa, and tongue normal; teeth and gums normal Neck: no adenopathy, no carotid bruit, supple, symmetrical, trachea midline and thyroid not enlarged, symmetric, no tenderness/mass/nodules Back: symmetric, no curvature. ROM normal. No CVA tenderness. Lungs: clear to auscultation bilaterally Heart: regular rate and rhythm, S1, S2 normal, no murmur, click, rub or gallop Abdomen: soft, non-tender; bowel sounds normal; no masses,  no organomegaly Pulses: 2+ and symmetric Skin: Skin color, texture, turgor normal. No rashes or lesions Lymph nodes: Cervical, supraclavicular, and axillary nodes normal.  Lab Results  Component Value Date   HGBA1C 5.9 05/29/2018   HGBA1C 5.9 05/11/2017   HGBA1C 6.1 11/09/2016    Lab Results  Component Value Date   CREATININE 4.96 (HH) 05/29/2018   CREATININE 4.6 04/19/2018   CREATININE 4.3 (A) 07/11/2017    Lab Results  Component Value Date   WBC 8.5 07/11/2017   HGB 9.1 (A) 04/19/2018   HCT 27 (A) 04/19/2018   PLT 211 04/19/2018   GLUCOSE 95 05/29/2018   CHOL 160 05/29/2018   TRIG 177.0 (H) 05/29/2018   HDL 24.40 (L) 05/29/2018   LDLDIRECT 134.0 05/11/2017   LDLCALC 101 (H) 05/29/2018   ALT 7 05/29/2018   AST 9 05/29/2018   NA 135 05/29/2018   K 4.6 05/29/2018   CL 107 05/29/2018   CREATININE 4.96 (HH) 05/29/2018   BUN 49 (H) 05/29/2018   CO2 22 05/29/2018   TSH 3.95 11/08/2013   PSA 4.34 (H) 07/26/2013   INR 0.9 01/03/2012   HGBA1C 5.9 05/29/2018   MICROALBUR 78.6 (H) 05/11/2017     Assessment & Plan:   Problem List Items Addressed This Visit    Adenocarcinoma, renal cell ([place])   CKD (chronic kidney disease), stage IV ([place])    Secondary to hypertension and NSAID use.  GFR is stable and very low, but he has normal lytes and not acidotic .  Follow up with Nephrology.  He has every intention of pursuing dialysis when offered , contrary to his wife's report.        DM (diabetes mellitus), type 2 with renal complications ([place]) - Primary     Remain well controlled on diet alone.  CKD is advanced but stable   (GFRis now < 20 ml /min)  due to medication non adherence with ARB.  Patient is intolerant of statin therapy and  Has had his ARB stopped roteinuria.  C  Lab Results  Component Value Date   HGBA1C 5.9 05/29/2018   Lab Results  Component Value Date   MICROALBUR 78.6 (H) 05/11/2017   Lab Results  Component Value Date   NA 135 05/29/2018   K 4.6 05/29/2018   CL 107 05/29/2018   CO2 22 05/29/2018  Relevant Orders   Hemoglobin A1c (Completed)   Lipid panel (Completed)   Comprehensive metabolic panel (Completed)   Hyperlipidemia    Risk of AMI in next 10 years is > 25% .  Goal is LDL < 70.  He is statin intolerant due to severe myalgias .  Will recommend trial of PSK9 inhibitor if covered by insurance  Lab Results  Component Value Date   CHOL 160 05/29/2018   HDL 24.40 (L) 05/29/2018   LDLCALC 101 (H) 05/29/2018   LDLDIRECT 134.0 05/11/2017   TRIG 177.0 (H) 05/29/2018   CHOLHDL 7 05/29/2018         Relevant Medications   hydrALAZINE (APRESOLINE) 25 MG tablet   Hypertensive renal sclerosis with hypertension    Now managed with clonidine and carvedilol.  Adding hydralazine 25 mg tid.  He is intolerant of normotension due to dizziness and headache, presumably from low blood flow state due to severe PAD>       Relevant Medications   hydrALAZINE (APRESOLINE) 25 MG tablet    Other Visit Diagnoses    Need for 23-polyvalent pneumococcal polysaccharide vaccine       Relevant Orders   Pneumococcal polysaccharide vaccine 23-valent greater than or equal to 2yo subcutaneous/IM (Completed)    A total of 40 minutes was spent with patient more than half of which was spent in counseling patient on the above mentioned issues , reviewing and explaining recent labs and imaging studies done, and coordination of care.  I am having [name] start on fluticasone and hydrALAZINE. I am also having her maintain her aspirin, diphenhydrAMINE, sildenafil, carvedilol, albuterol, buPROPion, ferrous sulfate, amLODipine, gabapentin, cloNIDine, sodium bicarbonate, and calcitRIOL. We will continue to administer ipratropium-albuterol.  Meds ordered this encounter  Medications  . fluticasone (FLONASE) 50 MCG/ACT nasal spray    Sig: Place 2 sprays into both nostrils daily.    Dispense:  16 g    Refill:  6  . hydrALAZINE (APRESOLINE) 25 MG tablet    Sig: Take 1 tablet (25 mg total) by mouth 3 (three) times daily.    Dispense:  90 tablet    Refill:  1    There are no discontinued medications.  Follow-up: Return in about 6 months (around 11/27/2018).   Crecencio Mc, MD

## 2018-05-30 NOTE — Assessment & Plan Note (Signed)
Secondary to hypertension and NSAID use.  GFR is stable and very low, but he has normal lytes and not acidotic .  Follow up with Nephrology.  He has every intention of pursuing dialysis when offered , contrary to his wife's report.

## 2018-05-30 NOTE — Assessment & Plan Note (Addendum)
Remain well controlled on diet alone.  CKD is advanced but stable   (GFRis now < 20 ml /min)  due to medication non adherence with ARB.  Patient is intolerant of statin therapy and  Has had his ARB stopped roteinuria.  C  Lab Results  Component Value Date   HGBA1C 5.9 05/29/2018   Lab Results  Component Value Date   MICROALBUR 78.6 (H) 05/11/2017   Lab Results  Component Value Date   NA 135 05/29/2018   K 4.6 05/29/2018   CL 107 05/29/2018   CO2 22 05/29/2018

## 2018-05-30 NOTE — Assessment & Plan Note (Signed)
Now managed with clonidine and carvedilol.  Adding hydralazine 25 mg tid.  He is intolerant of normotension due to dizziness and headache, presumably from low blood flow state due to severe PAD>

## 2018-05-30 NOTE — Assessment & Plan Note (Addendum)
Risk of AMI in next 10 years is > 25% .  Goal is LDL < 70.  He is statin intolerant due to severe myalgias .  Will recommend trial of PSK9 inhibitor if covered by insurance  Lab Results  Component Value Date   CHOL 160 05/29/2018   HDL 24.40 (L) 05/29/2018   LDLCALC 101 (H) 05/29/2018   LDLDIRECT 134.0 05/11/2017   TRIG 177.0 (H) 05/29/2018   CHOLHDL 7 05/29/2018

## 2018-07-11 DIAGNOSIS — H6123 Impacted cerumen, bilateral: Secondary | ICD-10-CM | POA: Diagnosis not present

## 2018-07-11 DIAGNOSIS — H9190 Unspecified hearing loss, unspecified ear: Secondary | ICD-10-CM | POA: Diagnosis not present

## 2018-07-25 ENCOUNTER — Other Ambulatory Visit: Payer: Self-pay | Admitting: Internal Medicine

## 2018-07-25 MED ORDER — BUPROPION HCL 75 MG PO TABS: 75.0000 mg | ORAL_TABLET | Freq: Two times a day (BID) | ORAL | 0 refills | Status: DC

## 2018-07-25 NOTE — Telephone Encounter (Signed)
Copied from [place] [id number]. Topic: Quick Communication - Rx Refill/Question >> Jul 25, 2018  3:18 PM Bea Graff, NT wrote: Medication: buPROPion (WELLBUTRIN) 75 MG tablet  Wife states he is depressed due to being in stage 5 kidney failure   Has the patient contacted their pharmacy? Yes.   (Agent: If no, request that the patient contact the pharmacy for the refill.) (Agent: If yes, when and what did the pharmacy advise?)  Preferred Pharmacy (with phone number or street name): [id number] [address], [place] - [address] [place] [phone] (Phone) [phone] (Fax)    Agent: Please be advised that RX refills may take up to 3 business days. We ask that you follow-up with your pharmacy.

## 2018-07-25 NOTE — Telephone Encounter (Signed)
Requested Prescriptions  Pending Prescriptions Disp Refills  . buPROPion (WELLBUTRIN) 75 MG tablet 180 tablet 0    Sig: Take 1 tablet (75 mg total) by mouth 2 (two) times daily.     Psychiatry: Antidepressants - bupropion Failed - 07/25/2018  3:20 PM      Failed - Last BP in normal range    BP Readings from Last 1 Encounters:  05/29/18 (!) 162/66         Passed - Completed PHQ-2 or PHQ-9 in the last 360 days.      Passed - Valid encounter within last 6 months    Recent Outpatient Visits          1 month ago Type 2 diabetes mellitus with diabetic nephropathy, without long-term current use of insulin ([place])   [place] Crecencio Mc, MD   1 year ago [place]   [facility] [place] Crecencio Mc, MD   1 year ago Controlled type 2 diabetes mellitus with complication, without long-term current use of insulin ([place])   [place] Crecencio Mc, MD   1 year ago [place]   [place], [place]   1 year ago Type 2 diabetes mellitus with diabetic nephropathy, without long-term current use of insulin ([place])   [place], [place], MD      Future Appointments            In 4 months Derrel Nip, Aris Everts, MD [place], [place]   In 8 months O'Brien-Blaney, [place], [place], [place]   In 8 months Derrel Nip, Aris Everts, MD [facility], [facility]

## 2018-07-27 DIAGNOSIS — N2581 Secondary hyperparathyroidism of renal origin: Secondary | ICD-10-CM | POA: Diagnosis not present

## 2018-07-27 DIAGNOSIS — Z720 Tobacco use: Secondary | ICD-10-CM | POA: Diagnosis not present

## 2018-07-27 DIAGNOSIS — I120 Hypertensive chronic kidney disease with stage 5 chronic kidney disease or end stage renal disease: Secondary | ICD-10-CM | POA: Diagnosis not present

## 2018-07-27 DIAGNOSIS — N2889 Other specified disorders of kidney and ureter: Secondary | ICD-10-CM | POA: Diagnosis not present

## 2018-07-27 DIAGNOSIS — E1122 Type 2 diabetes mellitus with diabetic chronic kidney disease: Secondary | ICD-10-CM | POA: Diagnosis not present

## 2018-07-27 DIAGNOSIS — D631 Anemia in chronic kidney disease: Secondary | ICD-10-CM | POA: Diagnosis not present

## 2018-07-27 DIAGNOSIS — E875 Hyperkalemia: Secondary | ICD-10-CM | POA: Diagnosis not present

## 2018-07-27 DIAGNOSIS — E872 Acidosis: Secondary | ICD-10-CM | POA: Diagnosis not present

## 2018-07-27 DIAGNOSIS — R809 Proteinuria, unspecified: Secondary | ICD-10-CM | POA: Diagnosis not present

## 2018-07-27 DIAGNOSIS — N185 Chronic kidney disease, stage 5: Secondary | ICD-10-CM | POA: Diagnosis not present

## 2018-07-27 DIAGNOSIS — I129 Hypertensive chronic kidney disease with stage 1 through stage 4 chronic kidney disease, or unspecified chronic kidney disease: Secondary | ICD-10-CM | POA: Diagnosis not present

## 2018-07-27 DIAGNOSIS — I10 Essential (primary) hypertension: Secondary | ICD-10-CM | POA: Diagnosis not present

## 2018-08-02 DIAGNOSIS — R809 Proteinuria, unspecified: Secondary | ICD-10-CM | POA: Diagnosis not present

## 2018-08-02 DIAGNOSIS — N2581 Secondary hyperparathyroidism of renal origin: Secondary | ICD-10-CM | POA: Diagnosis not present

## 2018-08-02 DIAGNOSIS — N185 Chronic kidney disease, stage 5: Secondary | ICD-10-CM | POA: Diagnosis not present

## 2018-08-02 DIAGNOSIS — E872 Acidosis: Secondary | ICD-10-CM | POA: Diagnosis not present

## 2018-08-02 DIAGNOSIS — I120 Hypertensive chronic kidney disease with stage 5 chronic kidney disease or end stage renal disease: Secondary | ICD-10-CM | POA: Diagnosis not present

## 2018-08-02 DIAGNOSIS — E875 Hyperkalemia: Secondary | ICD-10-CM | POA: Diagnosis not present

## 2018-08-25 ENCOUNTER — Other Ambulatory Visit: Payer: Self-pay | Admitting: Internal Medicine

## 2018-08-31 IMAGING — DX DG CHEST 2V
2 series · 2 of 2 positions shown · non-contrast
Comparison: Chest radiograph November 09, 2016

CLINICAL DATA: Productive cough and chest congestion which
shortness of breath for 3 weeks. Smoker.

EXAM:
CHEST  2 VIEW

[chest pa]
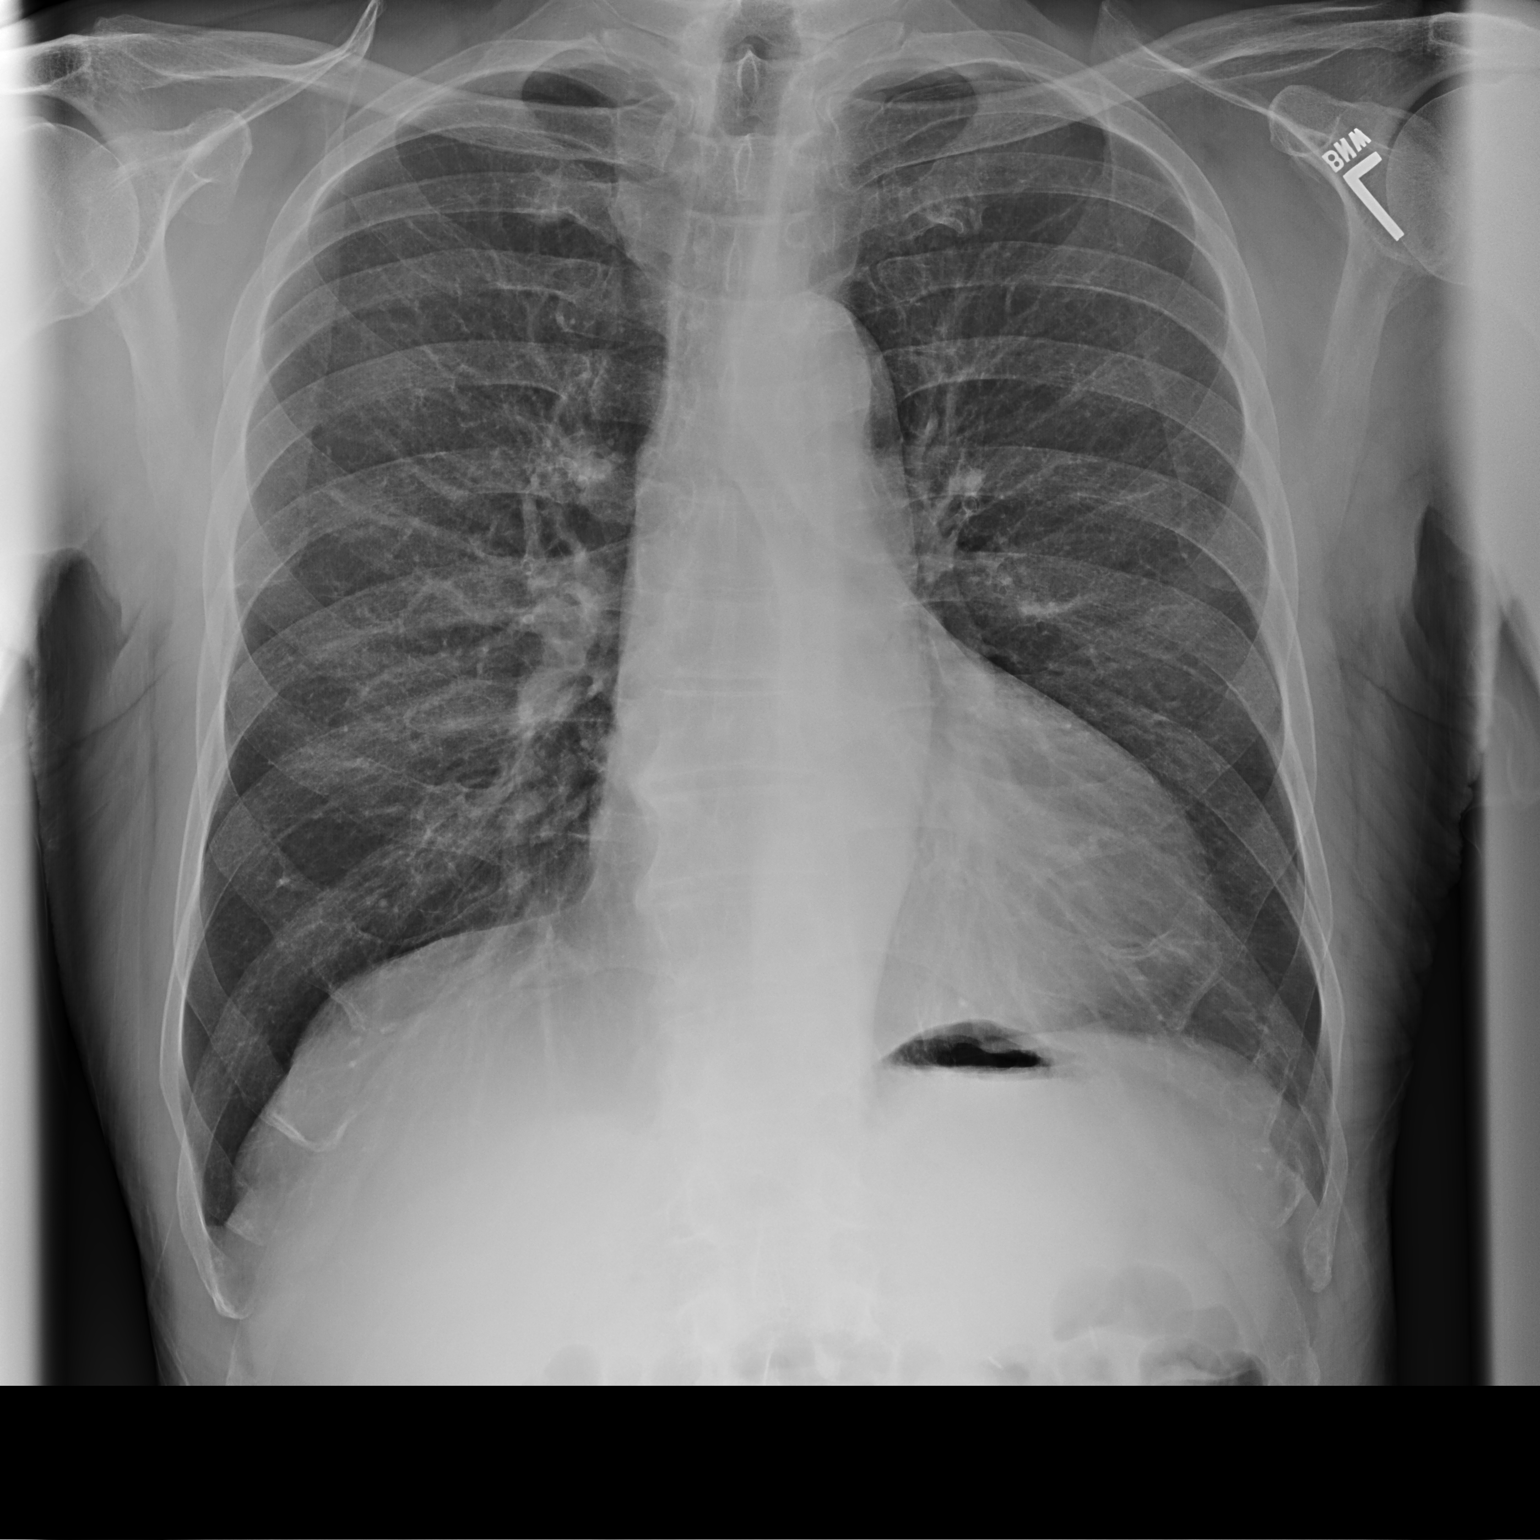

[chest lat]
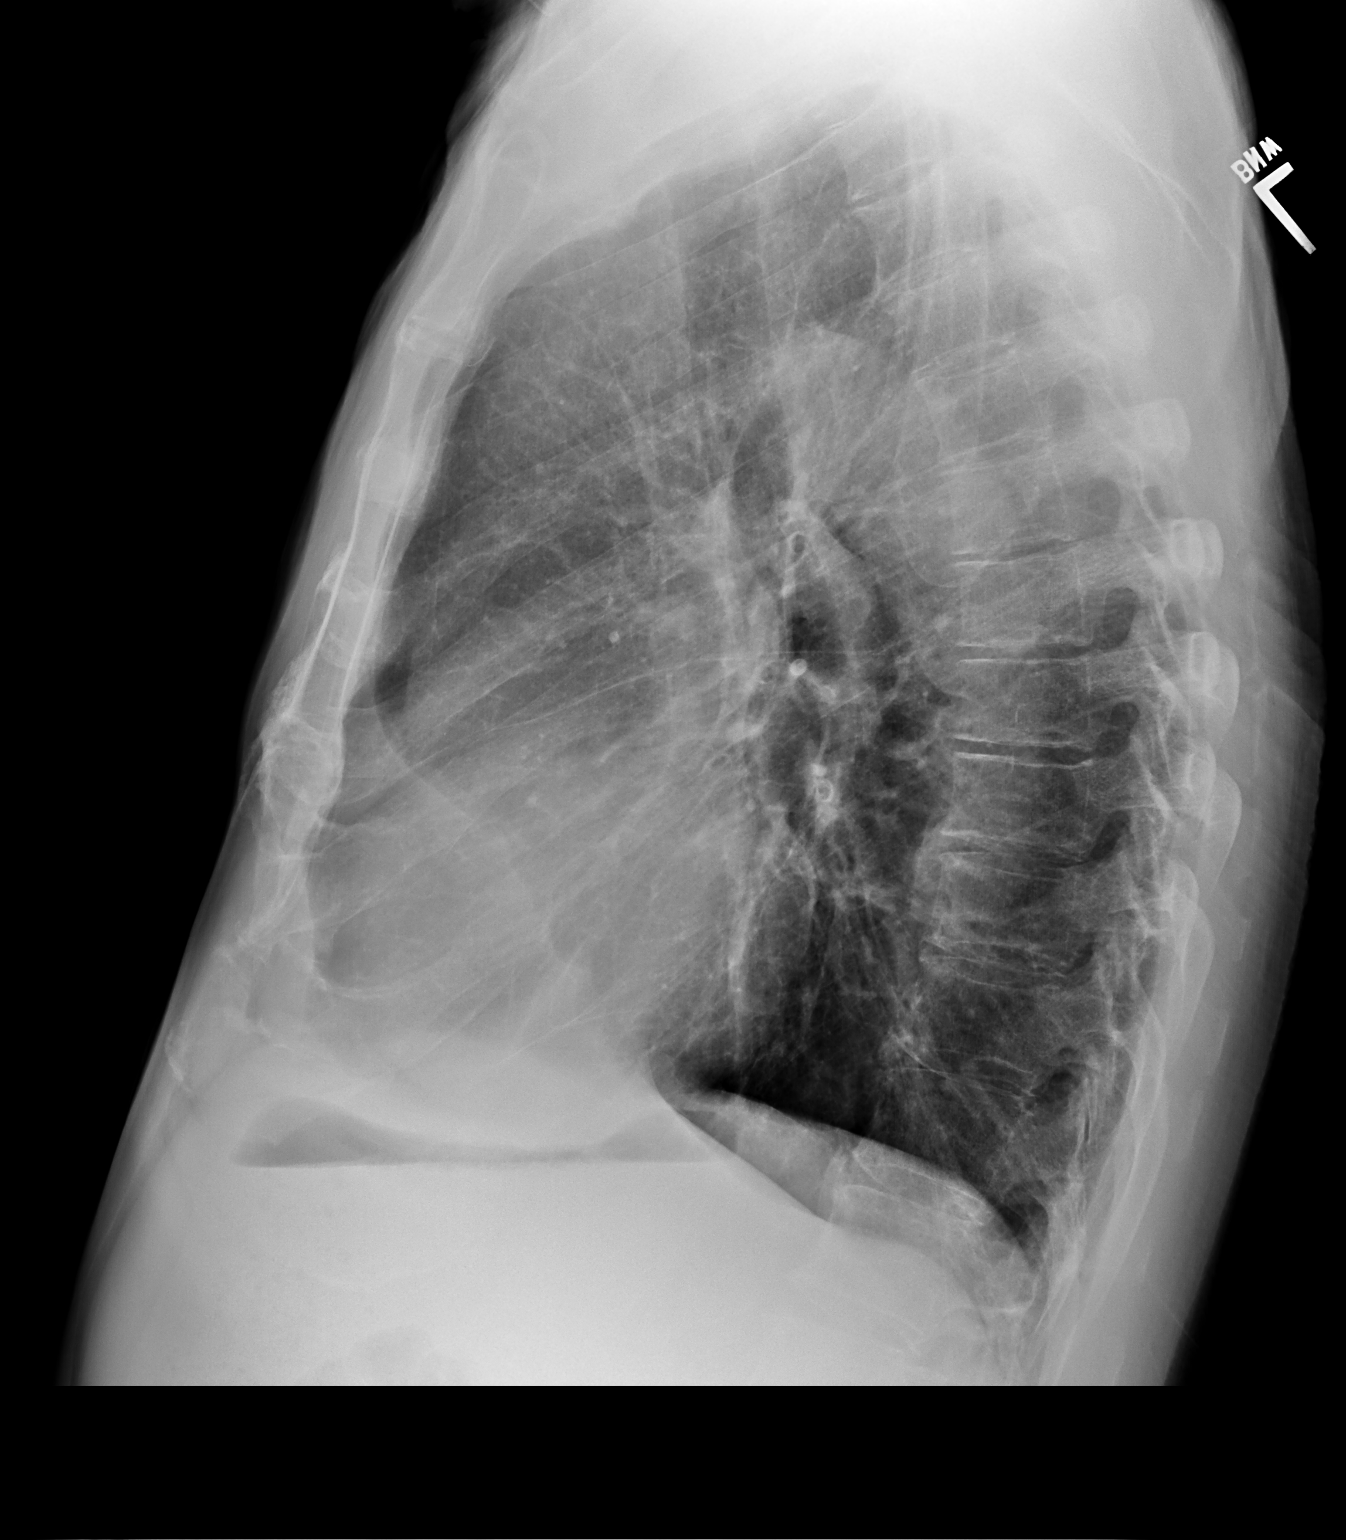

[2 of 2 positions shown; findings below may reference images not displayed]

FINDINGS: Cardiomediastinal silhouette is normal. No pleural effusions or
focal consolidations. Increased lung volumes with flattened
hemidiaphragms. Trachea projects midline and there is no
pneumothorax. Soft tissue planes and included osseous structures are
non-suspicious. Surgical clips in LEFT abdomen. Mild broad
dextroscoliosis lower thoracic spine may be positional.
IMPRESSION: Similar hyperinflation without focal consolidation.

## 2018-10-04 DIAGNOSIS — N2581 Secondary hyperparathyroidism of renal origin: Secondary | ICD-10-CM | POA: Diagnosis not present

## 2018-10-04 DIAGNOSIS — I10 Essential (primary) hypertension: Secondary | ICD-10-CM | POA: Diagnosis not present

## 2018-10-04 DIAGNOSIS — Z720 Tobacco use: Secondary | ICD-10-CM | POA: Diagnosis not present

## 2018-10-04 DIAGNOSIS — D631 Anemia in chronic kidney disease: Secondary | ICD-10-CM | POA: Diagnosis not present

## 2018-10-04 DIAGNOSIS — E875 Hyperkalemia: Secondary | ICD-10-CM | POA: Diagnosis not present

## 2018-10-04 DIAGNOSIS — N2889 Other specified disorders of kidney and ureter: Secondary | ICD-10-CM | POA: Diagnosis not present

## 2018-10-04 DIAGNOSIS — E872 Acidosis: Secondary | ICD-10-CM | POA: Diagnosis not present

## 2018-10-04 DIAGNOSIS — N185 Chronic kidney disease, stage 5: Secondary | ICD-10-CM | POA: Diagnosis not present

## 2018-10-04 DIAGNOSIS — I129 Hypertensive chronic kidney disease with stage 1 through stage 4 chronic kidney disease, or unspecified chronic kidney disease: Secondary | ICD-10-CM | POA: Diagnosis not present

## 2018-10-04 DIAGNOSIS — R809 Proteinuria, unspecified: Secondary | ICD-10-CM | POA: Diagnosis not present

## 2018-10-04 DIAGNOSIS — E1122 Type 2 diabetes mellitus with diabetic chronic kidney disease: Secondary | ICD-10-CM | POA: Diagnosis not present

## 2018-10-04 DIAGNOSIS — I120 Hypertensive chronic kidney disease with stage 5 chronic kidney disease or end stage renal disease: Secondary | ICD-10-CM | POA: Diagnosis not present

## 2018-10-25 DIAGNOSIS — D631 Anemia in chronic kidney disease: Secondary | ICD-10-CM | POA: Diagnosis not present

## 2018-10-25 DIAGNOSIS — I120 Hypertensive chronic kidney disease with stage 5 chronic kidney disease or end stage renal disease: Secondary | ICD-10-CM | POA: Diagnosis not present

## 2018-10-25 DIAGNOSIS — N185 Chronic kidney disease, stage 5: Secondary | ICD-10-CM | POA: Diagnosis not present

## 2018-10-25 DIAGNOSIS — N2581 Secondary hyperparathyroidism of renal origin: Secondary | ICD-10-CM | POA: Diagnosis not present

## 2018-11-14 ENCOUNTER — Encounter (INDEPENDENT_AMBULATORY_CARE_PROVIDER_SITE_OTHER): Payer: Self-pay | Admitting: Vascular Surgery

## 2018-11-14 ENCOUNTER — Other Ambulatory Visit: Payer: Self-pay

## 2018-11-14 ENCOUNTER — Ambulatory Visit (INDEPENDENT_AMBULATORY_CARE_PROVIDER_SITE_OTHER): Payer: Medicare Other | Admitting: Vascular Surgery

## 2018-11-14 VITALS — BP 153/62 | HR 58 | Resp 18 | Ht 66.0 in | Wt 143.0 lb

## 2018-11-14 DIAGNOSIS — F17210 Nicotine dependence, cigarettes, uncomplicated: Secondary | ICD-10-CM

## 2018-11-14 DIAGNOSIS — E1122 Type 2 diabetes mellitus with diabetic chronic kidney disease: Secondary | ICD-10-CM

## 2018-11-14 DIAGNOSIS — I10 Essential (primary) hypertension: Secondary | ICD-10-CM

## 2018-11-14 DIAGNOSIS — I120 Hypertensive chronic kidney disease with stage 5 chronic kidney disease or end stage renal disease: Secondary | ICD-10-CM

## 2018-11-14 DIAGNOSIS — Z79899 Other long term (current) drug therapy: Secondary | ICD-10-CM | POA: Diagnosis not present

## 2018-11-14 DIAGNOSIS — N185 Chronic kidney disease, stage 5: Secondary | ICD-10-CM | POA: Diagnosis not present

## 2018-11-14 DIAGNOSIS — E7800 Pure hypercholesterolemia, unspecified: Secondary | ICD-10-CM | POA: Diagnosis not present

## 2018-11-14 NOTE — Assessment & Plan Note (Signed)
Recommend:  The patient has advanced renal disease but currently does not have dialysis access.  The patient has elected to move forward with peritoneal dialysis. Although there is a history of prior operations, this does not appear at this time to be a prohibitive situation.  This was also discussed.  Risk and benefits were reviewed the patient.  Indications for the procedure were reviewed.  All questions were answered, the patient agrees to proceed with laparoscopic PD catheter insertion.   A total of 50 minutes was spent with this patient and her daughter and greater than 50% was spent in counseling and coordination of care with the patient.  Discussion included the treatment options for dialysis access and perioperative management.

## 2018-11-14 NOTE — Assessment & Plan Note (Signed)
lipid control important in reducing the progression of atherosclerotic disease. Continue statin therapy  

## 2018-11-14 NOTE — Progress Notes (Signed)
Patient ID: [name], adult   DOB: [birth year], 73 y.o.   MRN: [id number]  Chief Complaint  Patient presents with  . Follow-up    discuss PD catheter placement    HPI [name] is a 73 y.o. adult.  I am asked to see the patient by Dr. Juleen China for evaluation of need for permanent dialysis access.  He was apparently seen in our office as a lab only study in 2018 but did not see a provider.  This was for renal artery study.  He has had longstanding renal insufficiency which has gradually worsened over time.  This is likely a combination of hypertension as well as treatment for previous renal cell carcinomas.  The last GFR he remembers is 8.  He reports some lethargy and some swelling but overall continues to be reasonably active and is not immediately on dialysis.  He has had no previous dialysis access placements or treatments.  He is desirous to have peritoneal dialysis performed.  He does have a previous history of surgery on his right kidney with some sort of cryoablation and a partial nephrectomy on his left but no other abdominal surgery.  He currently does not have any abdominal pain   Past Medical History:  Diagnosis Date  . 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 Hyperlipidemia   . Hypertension   . Migraine    cluster  . Obstructive sleep apnea   . PAD (peripheral artery disease) [facility]) Feb 2009   nonobstructing, renal angiogram ([place])  . Renal cell carcinoma 2004   left kidney heminephrectomy  . TIA (transient ischemic attack) Aug 2013  . tobacco abuse     Past Surgical History:  Procedure Laterality Date  . CARDIAC CATHETERIZATION     Dr. [name] [facility])   . cyst removal  12/25/2015   Spine L4 and L5  . heminephrectomy  2004   for renal cell CA  . RENAL CRYOABLATION  Jan 2006   right kidney,  [name]  . sciatica      Family History Family History  Problem Relation Age of Onset  . Hypertension Mother    . Cancer Mother        breast  . Aneurysm Mother   . Coronary artery disease Father   . Hypertension Father   . Stroke Father   . Aneurysm Maternal Grandmother        brain  . Aneurysm Paternal Grandmother        brain  . Coronary artery disease Paternal Grandfather   . Heart disease Brother   . COPD Brother   . Hypertension Brother   . Stroke Paternal Uncle     Social History Social History   Tobacco Use  . Smoking status: Current Every Day Smoker    Packs/day: 1.50    Types: Cigarettes  . Smokeless tobacco: Never Used  Substance Use Topics  . Alcohol use: Yes    Alcohol/week: 2.0 standard drinks    Types: 1 Cans of beer, 1 Shots of liquor per week    Comment: social  . Drug use: No    Allergies  Allergen Reactions  . Irbesartan Other (See Comments)    hyperkalemia  . Atorvastatin Other (See Comments)    Muscle pain  . Bystolic [Nebivolol Hcl]     Extreme fatigue     Current Outpatient Medications  Medication Sig Dispense Refill  . amLODipine (NORVASC) 10 MG tablet  TAKE 1 TABLET BY MOUTH EVERY DAY 90 tablet 1  . aspirin 81 MG tablet Take 1 tablet (81 mg total) by mouth daily. 30 tablet 11  . calcitRIOL (ROCALTROL) 0.25 MCG capsule   3  . carvedilol (COREG) 6.25 MG tablet TK 1 T PO BID  6  . cloNIDine (CATAPRES) 0.1 MG tablet TK 1 T PO TID  3  . sodium bicarbonate 325 MG tablet TK 1 T PO BID  1  . albuterol (PROVENTIL HFA;VENTOLIN HFA) 108 (90 Base) MCG/ACT inhaler Inhale 2 puffs into the lungs every 6 (six) hours as needed for wheezing or shortness of breath. (Patient not taking: Reported on 11/14/2018) 1 Inhaler 0  . buPROPion (WELLBUTRIN) 75 MG tablet Take 1 tablet (75 mg total) by mouth 2 (two) times daily. (Patient not taking: Reported on 11/14/2018) 180 tablet 0  . diphenhydrAMINE (BENADRYL) 25 MG tablet Take 25 mg by mouth every 6 (six) hours as needed.    . ferrous sulfate 325 (65 FE) MG tablet Take 1 tablet (325 mg total) by mouth daily. With orange juice  (Patient not taking: Reported on 11/14/2018) 90 tablet 3  . fluticasone (FLONASE) 50 MCG/ACT nasal spray Place 2 sprays into both nostrils daily. (Patient not taking: Reported on 11/14/2018) 16 g 6  . gabapentin (NEURONTIN) 100 MG capsule TAKE ONE CAPSULE BY MOUTH ONCE DAILY AT BEDTIME (Patient not taking: Reported on 11/14/2018) 90 capsule 0  . hydrALAZINE (APRESOLINE) 25 MG tablet Take 1 tablet (25 mg total) by mouth 3 (three) times daily. (Patient not taking: Reported on 11/14/2018) 90 tablet 1  . sildenafil (REVATIO) 20 MG tablet 2-5 tablets as needed 1 hour prior to intercourse     Current Facility-Administered Medications  Medication Dose Route Frequency Provider Last Rate Last Dose  . ipratropium-albuterol (DUONEB) 0.5-2.5 (3) MG/3ML nebulizer solution 3 mL  3 mL Nebulization Q6H Crecencio Mc, MD          REVIEW OF SYSTEMS (Negative unless checked)  Constitutional: [] Weight loss  [] Fever  [] Chills Cardiac: [] Chest pain   [] Chest pressure   [] Palpitations   [] Shortness of breath when laying flat   [] Shortness of breath at rest   [] Shortness of breath with exertion. Vascular:  [] Pain in legs with walking   [] Pain in legs at rest   [] Pain in legs when laying flat   [] Claudication   [] Pain in feet when walking  [] Pain in feet at rest  [] Pain in feet when laying flat   [] History of DVT   [] Phlebitis   [] Swelling in legs   [] Varicose veins   [] Non-healing ulcers Pulmonary:   [] Uses home oxygen   [] Productive cough   [] Hemoptysis   [] Wheeze  [] COPD   [] Asthma Neurologic:  [] Dizziness  [] Blackouts   [] Seizures   [] History of stroke   [x] History of TIA  [] Aphasia   [] Temporary blindness   [] Dysphagia   [] Weakness or numbness in arms   [] Weakness or numbness in legs Musculoskeletal:  [x] Arthritis   [] Joint swelling   [] Joint pain   [] Low back pain Hematologic:  [] Easy bruising  [] Easy bleeding   [] Hypercoagulable state   [x] Anemic  [] Hepatitis Gastrointestinal:  [] Blood in stool   [] Vomiting blood   [] Gastroesophageal reflux/heartburn   [] Abdominal pain Genitourinary:  [x] Chronic kidney disease   [x] Difficult urination  [] Frequent urination  [] Burning with urination   [] Hematuria Skin:  [] Rashes   [] Ulcers   [] Wounds Psychological:  [] History of anxiety   []  History of major depression.  Physical Exam BP (!) 153/62 (BP Location: Right Arm)   Pulse (!) 58   Resp 18   Ht 5\' 6"  (1.676 m)   Wt 143 lb (64.9 kg)   BMI 23.08 kg/m  Gen:  WD/WN, NAD Head: [place]/AT, No temporalis wasting.  Ear/Nose/Throat: Hearing grossly intact, nares w/o erythema or drainage, oropharynx w/o Erythema/Exudate Eyes: Conjunctiva clear, sclera non-icteric  Neck: trachea midline.  No JVD.  Pulmonary:  Good air movement, respirations not labored, no use of accessory muscles  Cardiac: RRR, no JVD Vascular:  Vessel Right Left  Radial Palpable Palpable                          DP 1+ Trace   PT 1+  1+   Gastrointestinal:. No masses or tenderness, well healed scars Musculoskeletal: M/S 5/5 throughout.  Extremities without ischemic changes.  No deformity or atrophy. Trace LE edema. Neurologic: Sensation grossly intact in extremities.  Symmetrical.  Speech is fluent. Motor exam as listed above. Psychiatric: Judgment intact, Mood & affect appropriate for pt's clinical situation. Dermatologic: No rashes or ulcers noted.  No cellulitis or open wounds.    Radiology No results found.  Labs No results found for this or any previous visit (from the past 2160 hour(s)).  Assessment/Plan:  Essential hypertension blood pressure control important in reducing the progression of atherosclerotic disease. On appropriate oral medications.   DM (diabetes mellitus), type 2 with renal complications This is no underlying cause of his renal insufficiency and blood glucose control important in reducing the progression of atherosclerotic disease. Also, involved in wound healing. On appropriate medications.    Hyperlipidemia lipid control important in reducing the progression of atherosclerotic disease. Continue statin therapy   CKD (chronic kidney disease), stage V (HCC)  Recommend:  The patient has advanced renal disease but currently does not have dialysis access.  The patient has elected to move forward with peritoneal dialysis. Although there is a history of prior operations, this does not appear at this time to be a prohibitive situation.  This was also discussed.  Risk and benefits were reviewed the patient.  Indications for the procedure were reviewed.  All questions were answered, the patient agrees to proceed with laparoscopic PD catheter insertion.   A total of 50 minutes was spent with this patient and her daughter and greater than 50% was spent in counseling and coordination of care with the patient.  Discussion included the treatment options for dialysis access and perioperative management.        Leotis Pain 11/14/2018, 10:38 AM   This note was created with Dragon medical transcription system.  Any errors from dictation are unintentional.

## 2018-11-14 NOTE — Assessment & Plan Note (Signed)
This is no underlying cause of his renal insufficiency and blood glucose control important in reducing the progression of atherosclerotic disease. Also, involved in wound healing. On appropriate medications.

## 2018-11-14 NOTE — Patient Instructions (Signed)
Peritoneal Dialysis Catheter Placement  Peritoneal dialysis catheter placement is a surgery to insert a thin, flexible tube (catheter) into the abdomen. The catheter will be used for peritoneal dialysis, which is a process for filtering the blood. The catheter will be small, soft, and easy to conceal. The catheter placement is usually done at least 2 weeks before peritoneal dialysis is started. During dialysis, wastes, salt, and extra water are removed from the blood. In peritoneal dialysis, these tasks are performed by transferring a fluid (dialysate) to and from the abdomen during each session. The fluid goes through the catheter to enter the abdomen at the start of each dialysis session, and it drains out of the body through the catheter at the end of each session. This procedure will be done using one of the following techniques:  Open technique. This is when the surgery is performed through one incision.  Laparoscopic technique. This is when smaller incisions are made and a tube with a light and camera on the end (laparoscope) is inserted through one of the incisions to help perform the surgery. The camera sends images to a video screen in the operating room. This lets the surgeon see inside the abdomen during the procedure. Tell a health care provider about:  Any allergies you have.  All medicines you are taking, including vitamins, herbs, eye drops, creams, and over-the-counter medicines.  Any problems you or family members have had with anesthetic medicines.  Any blood disorders you have.  Any surgeries you have had.  Any history of smoking.  Any medical conditions you have.  Whether you are pregnant or may be pregnant. What are the risks? Generally, this is a safe procedure. However, problems may occur, including:  Infection.  Too much bleeding.  A collection of blood near the incision (hematoma).  Damage to blood vessels, tissues, or organs in the abdomen area.   Allergic reactions to medicines.  Pain or cramping.  Slow healing.  Catheter problems after the surgery, such as: ? The catheter becoming blocked. ? The catheter moving out of place. ? The catheter poking into or wrapping around intestines. ? Fluid leaking around the catheter.  Scarring.  Skin damage. What happens before the procedure? Medicines Ask your health care provider about:  Changing or stopping your regular medicines. This is especially important if you take diabetes medicines or blood thinners.  Taking medicines such as aspirin and ibuprofen. These medicines can thin your blood. Do not take these medicines before your procedure if your health care provider tells you not to. Staying hydrated Follow instructions from your health care provider about hydration, which may include:  Up to 2 hours before the procedure - you may continue to drink clear liquids, such as water, clear fruit juice, black coffee, and plain tea. Eating and drinking restrictions Follow instructions from your health care provider about eating and drinking, which may include:  8 hours before the procedure - stop eating heavy meals or foods such as meat, fried foods, or fatty foods.  6 hours before the procedure - stop eating light meals or foods, such as toast or cereal.  6 hours before the procedure - stop drinking milk or drinks that contain milk.  2 hours before the procedure - stop drinking clear liquids. General instructions  Ask your health care provider how your catheter site will be marked or identified. Your health care provider will discuss the best site for the catheter to be placed. The site will be chosen to help prevent  the catheter from being flattened or damaged, and to make it as comfortable as possible for you.  You may be asked to shower with a germ-killing soap.  You may have a CT scan of your abdomen.  You may have a blood sample taken.  Plan to have someone take you home  from the hospital or clinic. What happens during the procedure?  To lower your risk of infection: ? Your health care team will wash or sanitize their hands. ? Your skin will be washed with soap. ? Hair may be removed from the surgical area.  An IV will be inserted into one of your veins.  You may be given antibiotic medicine through your IV.  You will be given a medicine to make you fall asleep (general anesthetic).  If you are having an open surgery, one larger incision will be made in the abdomen.  If you are having laparoscopic surgery, a laparoscope and instruments will be put through small incisions in the abdomen.  The catheter will be put in place.  A short tunnel will be made under the skin to a location where the catheter exits the abdomen.  Stitches (sutures) will be placed around the catheter to hold it in place.  Your incisions will be closed with sutures or staples. The procedure may vary among health care providers and hospitals. What happens after the procedure?  Your blood pressure, heart rate, breathing rate, and blood oxygen level will be monitored until the medicines you were given have worn off.  You may have some pain. You will be given pain medicine as needed.  You will be given instructions about how to care for your catheter and how it is used for the dialysis process. Summary  Peritoneal dialysis catheter placement is a surgery to insert a thin, flexible tube (catheter) in your abdomen. This surgery must be done before you begin peritoneal dialysis.  Before the procedure, your health care provider will discuss the best site for the catheter to be placed. The site will be chosen to help prevent the catheter from being flattened or damaged, and to make it as comfortable as possible for you.  After the procedure, you will be given instructions about how to care for your catheter and how it is used for the dialysis process. This information is not intended  to replace advice given to you by your health care provider. Make sure you discuss any questions you have with your health care provider. Document Released: 04/28/2009 Document Revised: 06/18/2016 Document Reviewed: 06/18/2016 Elsevier Interactive Patient Education  2019 [employer].

## 2018-11-14 NOTE — Assessment & Plan Note (Signed)
blood pressure control important in reducing the progression of atherosclerotic disease. On appropriate oral medications.  

## 2018-11-15 ENCOUNTER — Telehealth (INDEPENDENT_AMBULATORY_CARE_PROVIDER_SITE_OTHER): Payer: Self-pay

## 2018-11-15 NOTE — Telephone Encounter (Signed)
Patient has been referred to Dr. Tyrell Antonio office for cardiac clearance today.

## 2018-11-27 ENCOUNTER — Other Ambulatory Visit: Payer: Self-pay

## 2018-11-27 ENCOUNTER — Encounter: Payer: Self-pay | Admitting: Internal Medicine

## 2018-11-27 ENCOUNTER — Ambulatory Visit (INDEPENDENT_AMBULATORY_CARE_PROVIDER_SITE_OTHER): Payer: Medicare Other | Admitting: Internal Medicine

## 2018-11-27 VITALS — BP 162/69

## 2018-11-27 DIAGNOSIS — F17209 Nicotine dependence, unspecified, with unspecified nicotine-induced disorders: Secondary | ICD-10-CM

## 2018-11-27 DIAGNOSIS — E1122 Type 2 diabetes mellitus with diabetic chronic kidney disease: Secondary | ICD-10-CM | POA: Diagnosis not present

## 2018-11-27 DIAGNOSIS — I739 Peripheral vascular disease, unspecified: Secondary | ICD-10-CM

## 2018-11-27 DIAGNOSIS — N185 Chronic kidney disease, stage 5: Secondary | ICD-10-CM | POA: Diagnosis not present

## 2018-11-27 DIAGNOSIS — M5431 Sciatica, right side: Secondary | ICD-10-CM

## 2018-11-27 DIAGNOSIS — Z716 Tobacco abuse counseling: Secondary | ICD-10-CM

## 2018-11-27 DIAGNOSIS — M5441 Lumbago with sciatica, right side: Secondary | ICD-10-CM | POA: Diagnosis not present

## 2018-11-27 DIAGNOSIS — M5442 Lumbago with sciatica, left side: Secondary | ICD-10-CM

## 2018-11-27 DIAGNOSIS — M7138 Other bursal cyst, other site: Secondary | ICD-10-CM | POA: Diagnosis not present

## 2018-11-27 DIAGNOSIS — G8929 Other chronic pain: Secondary | ICD-10-CM

## 2018-11-27 MED ORDER — GABAPENTIN 100 MG PO CAPS: 100.0000 mg | ORAL_CAPSULE | Freq: Three times a day (TID) | ORAL | 0 refills | Status: DC

## 2018-11-27 NOTE — Assessment & Plan Note (Signed)
Secondary to hypertension and NSAID use.  GFR is stable but  very low, but he is anemic and lethargic likely due to increased uremia .  He is awaiting peritoneal dialysis catheter placement by Dr Lucky Cowboy,  .  Follow up with Nephrology.

## 2018-11-27 NOTE — Assessment & Plan Note (Signed)
He has not been checking blood sugars.  a1c ordered.  HIstorically his diabetes has been controlled on diet alone.  CKD has advanced to stage V due to medication non adherence with ARB.  and he is awaiting dialysis     (GFRis now < 20 ml /min)   Patient is intolerant of statin therapy and  Has had his ARB stopped roteinuria.  C  Lab Results  Component Value Date   HGBA1C 5.9 05/29/2018   Lab Results  Component Value Date   MICROALBUR 78.6 (H) 05/11/2017   Lab Results  Component Value Date   NA 135 05/29/2018   K 4.6 05/29/2018   CL 107 05/29/2018   CO2 22 05/29/2018

## 2018-11-27 NOTE — Patient Instructions (Signed)
You do need to come the office for an a1c.  It Was not done by [facility] in march with other labs.     MRI lumbar spine ordered   Resume gabapentin up to 3 times daily if needed for back pain  If stronger medication is needed  Please call the office to arrange a telephone conversation

## 2018-11-27 NOTE — Assessment & Plan Note (Addendum)
Resected with prior back surgery , per review of records from [facility] . Last MRI in 2018 noted a synovial cyst at the L5-S1 level. And no signs of vertebral fracture or vertebral mets  From history of renal cell CA.  Repeat MRI needed given progression of pain to now constant

## 2018-11-27 NOTE — Assessment & Plan Note (Addendum)
Persistent for the past 1.5 years,  Due to recurrent synovial cysts and DJD.  Adding gabapentin for management of pain..  No foot drop

## 2018-11-27 NOTE — Progress Notes (Signed)
Telephone Note  This visit type was conducted due to national recommendations for restrictions regarding the COVID-19 pandemic (e.g. social distancing).  This format is felt to be most appropriate for this patient at this time.  All issues noted in this document were discussed and addressed.  No physical exam was performed (except for noted visual exam findings with Video Visits).   I connected with@ on 11/27/18 at  8:30 AM EDT by  telephone and verified that I am speaking with the correct person using two identifiers. Location patient: home Location provider: work or home office Persons participating in the virtual visit: patient, provider  I discussed the limitations, risks, security and privacy concerns of performing an evaluation and management service by telephone and the availability of in person appointments. I also discussed with the patient that there may be a patient responsible charge related to this service. The patient expressed understanding and agreed to proceed.  Reason for visit: follow up on hypertension, Diabetes    HPI:  HTN:  Patient is taking his medications as prescribed and notes no adverse effects.  Home BP readings have been done less than  once per week and are  Generally > 140/90 on maximal therapy . he is avoiding added salt in her diet and NOT walking regularly due to excessive fatigue and pain.   3 month follow up on diabetes. .  Patient is following a low glycemic index diet about 50% of the time and taking all prescribed medications regularly without side effects.  Fasting sugars have not been checked lately . Patient is NOT exercising about 3 times per week and intentionally trying to lose weight .  Patient has had an eye exam in the last 12 months and checks feet regularly for signs of infection.  Patient does not walk barefoot outside,  And denies an numbness tingling or burning in feet. Patient is up to date on all recommended vaccinations   notes decreased  energy due back pain and advanced kidney disease .  Saw Dr. Lucky Cowboy last month to discuss PD placement  August 12 is the earliest that it can be done locally  .  Still having bilateral buttock and leg pain for the past 1.5 years attributed  to a recurrent synovial cyst by MRI done at [facility] in April  2018 history  of L5 laminotomy in 2017 .  He has known PAD;  Lower extremity circulations was last evaluated with ABIs in 2017 and were 1.0 and 1.1   History of back surgery remotely.  has not seen a surgeon or orthopedist aince 2018.  Not taking any pain meds  ROS: See pertinent positives and negatives per HPI.  Past Medical History:  Diagnosis Date  . 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 Hyperlipidemia   . Hypertension   . Migraine    cluster  . Obstructive sleep apnea   . PAD (peripheral artery disease) [facility]) Feb 2009   nonobstructing, renal angiogram ([place])  . Renal cell carcinoma 2004   left kidney heminephrectomy  . TIA (transient ischemic attack) Aug 2013  . tobacco abuse     Past Surgical History:  Procedure Laterality Date  . CARDIAC CATHETERIZATION     Dr. [name] [facility])   . cyst removal  12/25/2015   Spine L4 and L5  . heminephrectomy  2004   for renal cell CA  . RENAL CRYOABLATION  Jan 2006   right kidney,  [name]  .  sciatica      Family History  Problem Relation Age of Onset  . Hypertension Mother   . Cancer Mother        breast  . Aneurysm Mother   . Coronary artery disease Father   . Hypertension Father   . Stroke Father   . Aneurysm Maternal Grandmother        brain  . Aneurysm Paternal Grandmother        brain  . Coronary artery disease Paternal Grandfather   . Heart disease Brother   . COPD Brother   . Hypertension Brother   . Stroke Paternal Uncle     SOCIAL HX:  reports that she has been smoking cigarettes. She has been smoking about 1.50 packs per day. She has never used smokeless tobacco. She  reports current alcohol use of about 2.0 standard drinks of alcohol per week. She reports that she does not use drugs.   Current Outpatient Medications:  .  amLODipine (NORVASC) 10 MG tablet, TAKE 1 TABLET BY MOUTH EVERY DAY, Disp: 90 tablet, Rfl: 1 .  aspirin 81 MG tablet, Take 1 tablet (81 mg total) by mouth daily., Disp: 30 tablet, Rfl: 11 .  calcitRIOL (ROCALTROL) 0.25 MCG capsule, , Disp: , Rfl: 3 .  carvedilol (COREG) 6.25 MG tablet, TK 1 T PO BID, Disp: , Rfl: 6 .  cloNIDine (CATAPRES) 0.1 MG tablet, TK 1 T PO TID, Disp: , Rfl: 3 .  diphenhydrAMINE (BENADRYL) 25 MG tablet, Take 25 mg by mouth every 6 (six) hours as needed., Disp: , Rfl:  .  ferrous sulfate 325 (65 FE) MG tablet, Take 1 tablet (325 mg total) by mouth daily. With orange juice, Disp: 90 tablet, Rfl: 3 .  fluticasone (FLONASE) 50 MCG/ACT nasal spray, Place 2 sprays into both nostrils daily., Disp: 16 g, Rfl: 6 .  sildenafil (REVATIO) 20 MG tablet, 2-5 tablets as needed 1 hour prior to intercourse, Disp: , Rfl:  .  sodium bicarbonate 325 MG tablet, TK 1 T PO BID, Disp: , Rfl: 1 .  albuterol (PROVENTIL HFA;VENTOLIN HFA) 108 (90 Base) MCG/ACT inhaler, Inhale 2 puffs into the lungs every 6 (six) hours as needed for wheezing or shortness of breath. (Patient not taking: Reported on 11/14/2018), Disp: 1 Inhaler, Rfl: 0 .  buPROPion (WELLBUTRIN) 75 MG tablet, Take 1 tablet (75 mg total) by mouth 2 (two) times daily. (Patient not taking: Reported on 11/14/2018), Disp: 180 tablet, Rfl: 0 .  gabapentin (NEURONTIN) 100 MG capsule, Take 1 capsule (100 mg total) by mouth 3 (three) times daily., Disp: 90 capsule, Rfl: 0  Current Facility-Administered Medications:  .  ipratropium-albuterol (DUONEB) 0.5-2.5 (3) MG/3ML nebulizer solution 3 mL, 3 mL, Nebulization, Q6H, Derrel Nip, Aris Everts, MD  EXAM:  General impression: alert, cooperative and articulate.  No signs of being in distress  Lungs: speech is fluent sentence length suggests that patient  is not short of breath and not punctuated by cough, sneezing or sniffing. Marland Kitchen   Psych: affect normal.  speech is articulate and non pressured .  Denies suicidal thoughts   ASSESSMENT AND PLAN:  Discussed the following assessment and plan: Tobacco abuse counseling counselled that any surgical options for relief of back pain will likely be postponed until he quits smoking as his risk for non healing is greater.   Synovial cyst of lumbar spine Resected with prior back surgery , per review of records from [facility] . Last MRI in 2018 noted a synovial cyst at the  L5-S1 level. And no signs of vertebral fracture or vertebral mets  From history of renal cell CA.  Repeat MRI needed given progression of pain to now constant   Sciatica of right side Persistent for the past 1.5 years,  Due to recurrent synovial cysts and DJD.  Adding gabapentin for management of pain..  No foot drop   CKD (chronic kidney disease), stage V ([place]) Secondary to hypertension and NSAID use.  GFR is stable but  very low, but he is anemic and lethargic likely due to increased uremia .  He is awaiting peritoneal dialysis catheter placement by Dr Lucky Cowboy,  .  Follow up with Nephrology.    DM (diabetes mellitus), type 2 with renal complications He has not been checking blood sugars.  a1c ordered.  HIstorically his diabetes has been controlled on diet alone.  CKD has advanced to stage V due to medication non adherence with ARB.  and he is awaiting dialysis     (GFRis now < 20 ml /min)   Patient is intolerant of statin therapy and  Has had his ARB stopped roteinuria.  C  Lab Results  Component Value Date   HGBA1C 5.9 05/29/2018   Lab Results  Component Value Date   MICROALBUR 78.6 (H) 05/11/2017   Lab Results  Component Value Date   NA 135 05/29/2018   K 4.6 05/29/2018   CL 107 05/29/2018   CO2 22 05/29/2018       I discussed the assessment and treatment plan with the patient. The patient was provided an opportunity to ask  questions and all were answered. The patient agreed with the plan and demonstrated an understanding of the instructions.   The patient was advised to call back or seek an in-person evaluation if the symptoms worsen or if the condition fails to improve as anticipated.  I provided 30 minutes of non-face-to-face time during this encounter.   Crecencio Mc, MD

## 2018-11-27 NOTE — Assessment & Plan Note (Signed)
counselled that any surgical options for relief of back pain will likely be postponed until he quits smoking as his risk for non healing is greater.

## 2018-11-30 ENCOUNTER — Ambulatory Visit (INDEPENDENT_AMBULATORY_CARE_PROVIDER_SITE_OTHER): Payer: Medicare Other

## 2018-11-30 ENCOUNTER — Other Ambulatory Visit: Payer: Self-pay

## 2018-11-30 DIAGNOSIS — E1121 Type 2 diabetes mellitus with diabetic nephropathy: Secondary | ICD-10-CM | POA: Diagnosis not present

## 2018-11-30 LAB — POCT GLYCOSYLATED HEMOGLOBIN (HGB A1C)
HbA1c POC (<> result, manual entry): 5.7 % (ref 4.0–5.6)
Hemoglobin A1C: 5.7 % — AB (ref 4.0–5.6)

## 2018-11-30 NOTE — Progress Notes (Signed)
Patient presented today for POC A1C check.  A1C was 5.7%

## 2018-12-01 ENCOUNTER — Telehealth: Payer: Self-pay | Admitting: Internal Medicine

## 2018-12-01 NOTE — Telephone Encounter (Signed)
Patient is calling back for his lab results from [place]. Please advise Thank you

## 2018-12-01 NOTE — Telephone Encounter (Signed)
See result note message 

## 2018-12-10 ENCOUNTER — Ambulatory Visit
Admission: RE | Admit: 2018-12-10 | Discharge: 2018-12-10 | Disposition: A | Discharge status: Home / Self Care | Payer: Medicare Other | Source: Ambulatory Visit | Attending: Internal Medicine | Admitting: Internal Medicine

## 2018-12-10 ENCOUNTER — Other Ambulatory Visit: Payer: Self-pay

## 2018-12-10 DIAGNOSIS — M5442 Lumbago with sciatica, left side: Secondary | ICD-10-CM | POA: Insufficient documentation

## 2018-12-10 DIAGNOSIS — G8929 Other chronic pain: Secondary | ICD-10-CM

## 2018-12-10 DIAGNOSIS — M7138 Other bursal cyst, other site: Secondary | ICD-10-CM | POA: Insufficient documentation

## 2018-12-10 DIAGNOSIS — M5441 Lumbago with sciatica, right side: Secondary | ICD-10-CM | POA: Insufficient documentation

## 2018-12-10 DIAGNOSIS — M545 Low back pain: Secondary | ICD-10-CM | POA: Diagnosis not present

## 2018-12-11 ENCOUNTER — Telehealth: Payer: Self-pay

## 2018-12-11 DIAGNOSIS — N2889 Other specified disorders of kidney and ureter: Secondary | ICD-10-CM | POA: Diagnosis not present

## 2018-12-11 DIAGNOSIS — N185 Chronic kidney disease, stage 5: Secondary | ICD-10-CM | POA: Diagnosis not present

## 2018-12-11 DIAGNOSIS — E872 Acidosis: Secondary | ICD-10-CM | POA: Diagnosis not present

## 2018-12-11 DIAGNOSIS — I10 Essential (primary) hypertension: Secondary | ICD-10-CM | POA: Diagnosis not present

## 2018-12-11 DIAGNOSIS — R809 Proteinuria, unspecified: Secondary | ICD-10-CM | POA: Diagnosis not present

## 2018-12-11 DIAGNOSIS — E1122 Type 2 diabetes mellitus with diabetic chronic kidney disease: Secondary | ICD-10-CM | POA: Diagnosis not present

## 2018-12-11 DIAGNOSIS — Z720 Tobacco use: Secondary | ICD-10-CM | POA: Diagnosis not present

## 2018-12-11 DIAGNOSIS — I129 Hypertensive chronic kidney disease with stage 1 through stage 4 chronic kidney disease, or unspecified chronic kidney disease: Secondary | ICD-10-CM | POA: Diagnosis not present

## 2018-12-11 DIAGNOSIS — I120 Hypertensive chronic kidney disease with stage 5 chronic kidney disease or end stage renal disease: Secondary | ICD-10-CM | POA: Diagnosis not present

## 2018-12-11 DIAGNOSIS — N2581 Secondary hyperparathyroidism of renal origin: Secondary | ICD-10-CM | POA: Diagnosis not present

## 2018-12-11 DIAGNOSIS — D631 Anemia in chronic kidney disease: Secondary | ICD-10-CM | POA: Diagnosis not present

## 2018-12-11 DIAGNOSIS — E875 Hyperkalemia: Secondary | ICD-10-CM | POA: Diagnosis not present

## 2018-12-11 NOTE — Telephone Encounter (Signed)
Copied from [place] [id number]. Topic: Quick Communication - See Telephone Encounter >> Dec 11, 2018  4:54 PM Loma [place] wrote: CRM for notification. See Telephone encounter for: 12/11/18. PT calling Janett Billow bk on test results pls fu at [phone]

## 2018-12-12 NOTE — Telephone Encounter (Signed)
See result note message 

## 2018-12-15 DIAGNOSIS — E875 Hyperkalemia: Secondary | ICD-10-CM | POA: Diagnosis not present

## 2018-12-15 DIAGNOSIS — N185 Chronic kidney disease, stage 5: Secondary | ICD-10-CM | POA: Diagnosis not present

## 2018-12-15 DIAGNOSIS — I120 Hypertensive chronic kidney disease with stage 5 chronic kidney disease or end stage renal disease: Secondary | ICD-10-CM | POA: Diagnosis not present

## 2018-12-15 DIAGNOSIS — R809 Proteinuria, unspecified: Secondary | ICD-10-CM | POA: Diagnosis not present

## 2018-12-15 DIAGNOSIS — E872 Acidosis: Secondary | ICD-10-CM | POA: Diagnosis not present

## 2018-12-18 ENCOUNTER — Other Ambulatory Visit: Payer: Self-pay | Admitting: Internal Medicine

## 2018-12-18 DIAGNOSIS — M7138 Other bursal cyst, other site: Secondary | ICD-10-CM

## 2018-12-18 DIAGNOSIS — M5431 Sciatica, right side: Secondary | ICD-10-CM

## 2018-12-27 ENCOUNTER — Other Ambulatory Visit: Payer: Self-pay

## 2018-12-27 MED ORDER — GABAPENTIN 100 MG PO CAPS: 100.0000 mg | ORAL_CAPSULE | Freq: Three times a day (TID) | ORAL | 1 refills | Status: DC

## 2019-01-01 NOTE — Progress Notes (Signed)
New Outpatient Visit Date: 01/03/2019  Referring Provider: Crecencio Mc, MD [address] [place],  [place] [address]  Chief Complaint: Preoperative cardiovascular risk assessment  HPI:  [name] is a 73 y.o. male who is being seen today for perioperative cardiac risk assessment in anticipation of peritoneal dialysis catheter placement at the request of Dr. Lucky Cowboy. He has a history of hypertension, hyperlipidemia, obstructive sleep apnea, peripheral vascular disease, renal cell carcinoma s/p left nephrectomy, adenocarcinoma of the appendix, and CKD stage V.   Today, [name] reports that he has been feeling well.  He denies chest pain, though he has noted some fatigue and exertional dyspnea that is more pronounced compared with a year ago.  His biggest limitation is pain in his legs and hips which "tighten up" with even modest activity.  He recently underwent lumbar spine MRI and was told that he has not had any significant disease to explain his pain.  Vascular evaluation is currently pending.  He has experienced progressive lower extremity edema and wishes to undergo peritoneal dialysis catheter placement in anticipation of progressing to ESRD.  He denies significant edema or weight changes.  He has denies palpitations, lightheadedness, orthopnea, and PND.  He is concerned about undergoing pharmacologic myocardial perfusion stress testing, as his mother had a bad experience with regadebnoson in the past.  --------------------------------------------------------------------------------------------------  Cardiovascular History & Procedures: Cardiovascular Problems:  Renal artery stenosis (nonobstructive)  Risk Factors:  Hypertension, hyperlipidemia, male gender, PAD, and age > 22  Cath/PCI:  None available  CV Surgery:  None  EP Procedures and Devices:  None  Non-Invasive Evaluation(s):  None  Recent CV Pertinent Labs: Lab Results  Component Value Date   CHOL 160 05/29/2018   CHOL 159 01/04/2012   HDL 24.40 (L) 05/29/2018   HDL 17 (L) 01/04/2012   LDLCALC 101 (H) 05/29/2018   LDLCALC 98 01/04/2012   LDLDIRECT 134.0 05/11/2017   TRIG 177.0 (H) 05/29/2018   TRIG 218 (H) 01/04/2012   CHOLHDL 7 05/29/2018   INR 0.9 01/03/2012   K 4.6 05/29/2018   K 4.3 07/04/2012   MG 2.6 (H) 11/08/2013   BUN 49 (H) 05/29/2018   BUN 44 (A) 04/19/2018   BUN 15 07/04/2012   CREATININE 4.96 (HH) 05/29/2018   CREATININE 1.92 (H) 07/18/2015    --------------------------------------------------------------------------------------------------  Past Medical History:  Diagnosis Date  . 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 Hyperlipidemia   . Hypertension   . Migraine    cluster  . Obstructive sleep apnea   . PAD (peripheral artery disease) [facility]) Feb 2009   nonobstructing, renal angiogram ([place])  . Renal cell carcinoma 2004   left kidney heminephrectomy  . TIA (transient ischemic attack) Aug 2013  . tobacco abuse     Past Surgical History:  Procedure Laterality Date  . CARDIAC CATHETERIZATION     Dr. [name] [facility])   . cyst removal  12/25/2015   Spine L4 and L5  . heminephrectomy  2004   for renal cell CA  . RENAL CRYOABLATION  Jan 2006   right kidney,  [name]  . sciatica      Current Meds  Medication Sig  . albuterol (PROVENTIL HFA;VENTOLIN HFA) 108 (90 Base) MCG/ACT inhaler Inhale 2 puffs into the lungs every 6 (six) hours as needed for wheezing or shortness of breath.  Marland Kitchen amLODipine (NORVASC) 10 MG tablet TAKE 1 TABLET BY MOUTH EVERY DAY  . aspirin  81 MG tablet Take 1 tablet (81 mg total) by mouth daily.  . calcitRIOL (ROCALTROL) 0.25 MCG capsule Take 0.25 mcg by mouth daily.   . carvedilol (COREG) 6.25 MG tablet TK 1 T PO BID  . cloNIDine (CATAPRES) 0.1 MG tablet TK 1 T PO TID  . diphenhydrAMINE (BENADRYL) 25 MG tablet Take 25 mg by mouth every 6 (six) hours as needed.  . ferrous  sulfate 325 (65 FE) MG tablet Take 1 tablet (325 mg total) by mouth daily. With orange juice  . fluticasone (FLONASE) 50 MCG/ACT nasal spray Place 2 sprays into both nostrils daily. (Patient taking differently: Place 2 sprays into both nostrils as needed. )  . gabapentin (NEURONTIN) 100 MG capsule Take 1 capsule (100 mg total) by mouth 3 (three) times daily. (Patient taking differently: Take 100 mg by mouth 3 (three) times daily as needed. )  . sildenafil (REVATIO) 20 MG tablet 2-5 tablets as needed 1 hour prior to intercourse  . sodium bicarbonate 325 MG tablet TK 1 T PO BID   Current Facility-Administered Medications for the 01/03/19 encounter (Office Visit) with [place], Harrell Gave, MD  Medication  . ipratropium-albuterol (DUONEB) 0.5-2.5 (3) MG/3ML nebulizer solution 3 mL    Allergies: Irbesartan, Atorvastatin, and Bystolic [nebivolol hcl]  Social History   Tobacco Use  . Smoking status: Current Every Day Smoker    Packs/day: 1.00    Years: 50.00    Pack years: 50.00    Types: Cigarettes  . Smokeless tobacco: Never Used  Substance Use Topics  . Alcohol use: Yes    Alcohol/week: 2.0 standard drinks    Types: 1 Cans of beer, 1 Shots of liquor per week    Comment: social  . Drug use: No    Family History  Problem Relation Age of Onset  . Hypertension Mother   . Cancer Mother        breast  . Aneurysm Mother   . Coronary artery disease Father   . Hypertension Father   . Stroke Father   . Heart disease Father   . Heart attack Father   . Aneurysm Maternal Grandmother        brain  . Aneurysm Paternal Grandmother        brain  . Coronary artery disease Paternal Grandfather   . Heart disease Brother   . COPD Brother   . Hypertension Brother   . Stroke Paternal Uncle     Review of Systems: A 12-system review of systems was performed and was negative except as noted in the HPI.   --------------------------------------------------------------------------------------------------  Physical Exam: BP (!) 188/80 (BP Location: Right Arm, Patient Position: Sitting, Cuff Size: Normal)   Pulse (!) 48   Ht 5\' 6"  (1.676 m)   Wt 141 lb 8 oz (64.2 kg)   BMI 22.84 kg/m   General: NAD. HEENT: No conjunctival pallor or scleral icterus.  Facemask in place Neck: Supple without lymphadenopathy, thyromegaly, JVD, or HJR. No carotid bruit. Lungs: Normal work of breathing. Clear to auscultation bilaterally without wheezes or crackles. Heart: Bradycardic but regular without murmurs, rubs, or gallops. Non-displaced PMI. Abd: Bowel sounds present. Soft, NT/ND without hepatosplenomegaly Ext: No lower extremity edema. Radial, PT, and DP pulses are 2+ bilaterally Skin: Warm and dry without rash. Neuro: CNIII-XII intact. Strength and fine-touch sensation intact in upper and lower extremities bilaterally. Psych: Normal mood and affect.  EKG: Sinus bradycardia (heart rate 48 bpm) with left anterior fascicular block and LVH.  There is poor R  wave progression which may reflect lead placement but anterior MI cannot be excluded.  Lab Results  Component Value Date   WBC 8.5 07/11/2017   HGB 9.1 (A) 04/19/2018   HCT 27 (A) 04/19/2018   MCV 86.8 05/11/2016   PLT 211 04/19/2018    Lab Results  Component Value Date   NA 135 05/29/2018   K 4.6 05/29/2018   CL 107 05/29/2018   CO2 22 05/29/2018   BUN 49 (H) 05/29/2018   CREATININE 4.96 (HH) 05/29/2018   GLUCOSE 95 05/29/2018   ALT 7 05/29/2018    Lab Results  Component Value Date   CHOL 160 05/29/2018   HDL 24.40 (L) 05/29/2018   LDLCALC 101 (H) 05/29/2018   LDLDIRECT 134.0 05/11/2017   TRIG 177.0 (H) 05/29/2018   CHOLHDL 7 05/29/2018     --------------------------------------------------------------------------------------------------  ASSESSMENT AND PLAN: Preoperative cardiovascular risk assessment: [name] denies any  unstable cardiac symptoms, though he has experienced some progression of exertional dyspnea over the last year.  He remains fairly active, though hip and leg pain limits him somewhat.  Given multiple cardiac risk factors and abnormal EKG today, I recommended that we obtain an echocardiogram and likely subsequent myocardial perfusion stress test.  [name] is in agreement with the echocardiogram but is very reluctant to proceed with the MPI, given side effects from regadenoson that his mother experience.  If he does not have any significant wall motion abnormality on echo, we could consider deferring stress testing altogether versus pursuing a dobutamine stress echocardiogram (this would need to be performed in [place]).  I would like to avoid IV contrast (CT of the catheterization), if possible, as this would likely hasten renal failure.  Final recommendations regarding perioperative risk to be made after completion of testing.  Dyspnea on exertion: I suspect shortness of breath is most likely this is due to underlying hypertensive heart disease.  [name] appears euvolemic on exam today, though his blood pressure is uncontrolled.  EKG also shows findings of LVH and possible prior anterior MI versus lead placement.  We will therefore obtain a transthoracic echocardiogram for further assessment and then readdress need for ischemia evaluation, as outlined above.  I think is reasonable to continue low-dose aspirin as well has carvedilol and amlodipine.  Hypertension: Blood pressure poorly controlled today, similar or even above half prior readings.  I recommended increasing clonidine to 0.2 mg twice daily.  We will need to monitor his heart rate, given resting bradycardia.  I am somewhat leery to de-escalate carvedilol at this time, however, as the patient is not symptomatic but has poorly controlled hypertension.  Blood pressure remains suboptimally controlled or we need to de-escalate carvedilol or  clonidine due to bradycardia, hydralazine would be my next choice.  I will defer ongoing management of hypertension to the patient's PCP and nephrologist.  Follow-up: Return to clinic as needed based on results of aforementioned cardiac testing.  [name], MD 01/03/2019 11:33 AM

## 2019-01-02 DIAGNOSIS — M79605 Pain in left leg: Secondary | ICD-10-CM | POA: Diagnosis not present

## 2019-01-02 DIAGNOSIS — M79604 Pain in right leg: Secondary | ICD-10-CM | POA: Diagnosis not present

## 2019-01-03 ENCOUNTER — Encounter: Payer: Self-pay | Admitting: Internal Medicine

## 2019-01-03 ENCOUNTER — Other Ambulatory Visit: Payer: Self-pay

## 2019-01-03 ENCOUNTER — Ambulatory Visit (INDEPENDENT_AMBULATORY_CARE_PROVIDER_SITE_OTHER): Payer: Medicare Other | Admitting: Internal Medicine

## 2019-01-03 VITALS — BP 188/80 | HR 48 | Ht 66.0 in | Wt 141.5 lb

## 2019-01-03 DIAGNOSIS — Z01810 Encounter for preprocedural cardiovascular examination: Secondary | ICD-10-CM | POA: Diagnosis not present

## 2019-01-03 DIAGNOSIS — I10 Essential (primary) hypertension: Secondary | ICD-10-CM

## 2019-01-03 DIAGNOSIS — R0600 Dyspnea, unspecified: Secondary | ICD-10-CM

## 2019-01-03 DIAGNOSIS — R0609 Other forms of dyspnea: Secondary | ICD-10-CM | POA: Diagnosis not present

## 2019-01-03 MED ORDER — CLONIDINE HCL 0.2 MG PO TABS: 0.2000 mg | ORAL_TABLET | Freq: Three times a day (TID) | ORAL | 3 refills | Status: DC

## 2019-01-03 NOTE — Patient Instructions (Signed)
Medication Instructions:  Your physician has recommended you make the following change in your medication:  1- INCREASE  Clonidine to 0.2 mg by mouth three times a day.  If you need a refill on your cardiac medications before your next appointment, please call your pharmacy.   Lab work: - None ordered.  If you have labs (blood work) drawn today and your tests are completely normal, you will receive your results only by: Marland Kitchen MyChart Message (if you have MyChart) OR . A paper copy in the mail If you have any lab test that is abnormal or we need to change your treatment, we will call you to review the results.  Testing/Procedures: Your physician has requested that you have an echocardiogram. Echocardiography is a painless test that uses sound waves to create images of your heart. It provides your doctor with information about the size and shape of your heart and how well your heart's chambers and valves are working. This procedure takes approximately one hour. There are no restrictions for this procedure. You may get an IV, if needed, to receive an ultrasound enhancing agent through to better visualize your heart.    Follow-Up: At [facility], you and your health needs are our priority.  As part of our continuing mission to provide you with exceptional heart care, we have created designated Provider Care Teams.  These Care Teams include your primary Cardiologist (physician) and Advanced Practice Providers (APPs -  Physician Assistants and Nurse Practitioners) who all work together to provide you with the care you need, when you need it. You will need a follow up appointment in AS NEEDED BASED ON RESULTS OF ECHO.      Echocardiogram An echocardiogram is a procedure that uses painless sound waves (ultrasound) to produce an image of the heart. Images from an echocardiogram can provide important information about:  Signs of coronary artery disease (CAD).  Aneurysm detection. An aneurysm is a  weak or damaged part of an artery wall that bulges out from the normal force of blood pumping through the body.  Heart size and shape. Changes in the size or shape of the heart can be associated with certain conditions, including heart failure, aneurysm, and CAD.  Heart muscle function.  Heart valve function.  Signs of a past heart attack.  Fluid buildup around the heart.  Thickening of the heart muscle.  A tumor or infectious growth around the heart valves. Tell a health care provider about:  Any allergies you have.  All medicines you are taking, including vitamins, herbs, eye drops, creams, and over-the-counter medicines.  Any blood disorders you have.  Any surgeries you have had.  Any medical conditions you have.  Whether you are pregnant or may be pregnant. What are the risks? Generally, this is a safe procedure. However, problems may occur, including:  Allergic reaction to dye (contrast) that may be used during the procedure. What happens before the procedure? No specific preparation is needed. You may eat and drink normally. What happens during the procedure?   An IV tube may be inserted into one of your veins.  You may receive contrast through this tube. A contrast is an injection that improves the quality of the pictures from your heart.  A gel will be applied to your chest.  A wand-like tool (transducer) will be moved over your chest. The gel will help to transmit the sound waves from the transducer.  The sound waves will harmlessly bounce off of your heart to allow  the heart images to be captured in real-time motion. The images will be recorded on a computer. The procedure may vary among health care providers and hospitals. What happens after the procedure?  You may return to your normal, everyday life, including diet, activities, and medicines, unless your health care provider tells you not to do that. Summary  An echocardiogram is a procedure that uses  painless sound waves (ultrasound) to produce an image of the heart.  Images from an echocardiogram can provide important information about the size and shape of your heart, heart muscle function, heart valve function, and fluid buildup around your heart.  You do not need to do anything to prepare before this procedure. You may eat and drink normally.  After the echocardiogram is completed, you may return to your normal, everyday life, unless your health care provider tells you not to do that. This information is not intended to replace advice given to you by your health care provider. Make sure you discuss any questions you have with your health care provider. Document Released: 05/07/2000 Document Revised: 08/31/2018 Document Reviewed: 06/12/2016 Elsevier Patient Education  2020 [employer].

## 2019-01-04 ENCOUNTER — Encounter: Payer: Self-pay | Admitting: Internal Medicine

## 2019-01-11 DIAGNOSIS — M79605 Pain in left leg: Secondary | ICD-10-CM | POA: Diagnosis not present

## 2019-01-11 DIAGNOSIS — M79604 Pain in right leg: Secondary | ICD-10-CM | POA: Diagnosis not present

## 2019-01-17 ENCOUNTER — Other Ambulatory Visit: Payer: Self-pay

## 2019-01-17 ENCOUNTER — Ambulatory Visit (INDEPENDENT_AMBULATORY_CARE_PROVIDER_SITE_OTHER): Payer: Medicare Other

## 2019-01-17 DIAGNOSIS — R809 Proteinuria, unspecified: Secondary | ICD-10-CM | POA: Diagnosis not present

## 2019-01-17 DIAGNOSIS — Z720 Tobacco use: Secondary | ICD-10-CM | POA: Diagnosis not present

## 2019-01-17 DIAGNOSIS — E1122 Type 2 diabetes mellitus with diabetic chronic kidney disease: Secondary | ICD-10-CM | POA: Diagnosis not present

## 2019-01-17 DIAGNOSIS — I129 Hypertensive chronic kidney disease with stage 1 through stage 4 chronic kidney disease, or unspecified chronic kidney disease: Secondary | ICD-10-CM | POA: Diagnosis not present

## 2019-01-17 DIAGNOSIS — E872 Acidosis: Secondary | ICD-10-CM | POA: Diagnosis not present

## 2019-01-17 DIAGNOSIS — R0609 Other forms of dyspnea: Secondary | ICD-10-CM

## 2019-01-17 DIAGNOSIS — E875 Hyperkalemia: Secondary | ICD-10-CM | POA: Diagnosis not present

## 2019-01-17 DIAGNOSIS — D631 Anemia in chronic kidney disease: Secondary | ICD-10-CM | POA: Diagnosis not present

## 2019-01-17 DIAGNOSIS — R0600 Dyspnea, unspecified: Secondary | ICD-10-CM

## 2019-01-17 DIAGNOSIS — N2581 Secondary hyperparathyroidism of renal origin: Secondary | ICD-10-CM | POA: Diagnosis not present

## 2019-01-17 DIAGNOSIS — I10 Essential (primary) hypertension: Secondary | ICD-10-CM | POA: Diagnosis not present

## 2019-01-17 DIAGNOSIS — I120 Hypertensive chronic kidney disease with stage 5 chronic kidney disease or end stage renal disease: Secondary | ICD-10-CM | POA: Diagnosis not present

## 2019-01-17 DIAGNOSIS — N185 Chronic kidney disease, stage 5: Secondary | ICD-10-CM | POA: Diagnosis not present

## 2019-01-17 DIAGNOSIS — N2889 Other specified disorders of kidney and ureter: Secondary | ICD-10-CM | POA: Diagnosis not present

## 2019-01-19 ENCOUNTER — Telehealth: Payer: Self-pay | Admitting: Internal Medicine

## 2019-01-19 NOTE — Telephone Encounter (Signed)
Left voicemail for patient to call [place] office to discuss results of this weeks echo.  EF noted to be 40-45%.  I recommend proceeding with pharmacologic myocardial perfusion stress test for evaluation of shortness of breath and cardiomyopathy before proceeding with elective surgery.  [name], MD [facility] Pager: [phone]

## 2019-01-22 ENCOUNTER — Telehealth: Payer: Self-pay | Admitting: *Deleted

## 2019-01-22 DIAGNOSIS — N2581 Secondary hyperparathyroidism of renal origin: Secondary | ICD-10-CM | POA: Diagnosis not present

## 2019-01-22 DIAGNOSIS — R0602 Shortness of breath: Secondary | ICD-10-CM

## 2019-01-22 DIAGNOSIS — N185 Chronic kidney disease, stage 5: Secondary | ICD-10-CM | POA: Diagnosis not present

## 2019-01-22 DIAGNOSIS — D631 Anemia in chronic kidney disease: Secondary | ICD-10-CM | POA: Diagnosis not present

## 2019-01-22 DIAGNOSIS — R809 Proteinuria, unspecified: Secondary | ICD-10-CM | POA: Diagnosis not present

## 2019-01-22 DIAGNOSIS — I429 Cardiomyopathy, unspecified: Secondary | ICD-10-CM

## 2019-01-22 DIAGNOSIS — E875 Hyperkalemia: Secondary | ICD-10-CM | POA: Diagnosis not present

## 2019-01-22 DIAGNOSIS — E872 Acidosis: Secondary | ICD-10-CM | POA: Diagnosis not present

## 2019-01-22 NOTE — Telephone Encounter (Signed)
No answer. Left message to call back.   

## 2019-01-22 NOTE — Telephone Encounter (Signed)
-----   Message from [name], MD sent at 01/20/2019 11:59 AM EDT ----- Left message for patient to contact office to discuss results.  LVEF moderately reduced.  Given dyspnea and multiple cardiac risk factors, I recommend pharmacologic myocardial perfusion stress test to exclude high-risk ischemia prior to proceeding with elective surgery.

## 2019-01-23 NOTE — Telephone Encounter (Signed)
I attempted to reach Mr. Popoff by phone but did not reach an answer.  Given his concern about pharmacologic stress testing, it is reasonable to attempt an exercise myocardial perfusion stress test for evaluation of his dyspnea and cardiomyopathy.  He will need to hold carvedilol for 24 hours prior to the stress test as well as clonidine on the morning of the procedure.  If he is unable to achieve target heart rate, we will need to readdress pharmacologic stressing versus CTA/cath versus forgoing ischemia testing (which would place him at intermediate to high risk for elective surgery).  I will have our office attempt to reach out to him again to arrange for the stress test.  [name], MD [place] Pager: [phone]

## 2019-01-23 NOTE — Telephone Encounter (Signed)
Patient calling back. He verbalized understanding of the results. He feels very strongly about not having the lexiscan myoview. States he had an aunt and friend that felt they almost died from during it.  He said he has hip pain but would rather try to walk on a treadmill.  Advised that I would make Dr End aware of this feelings regarding the pharmacological stress test and call him with any further recommendations.

## 2019-01-23 NOTE — Telephone Encounter (Signed)
No answer on either home or mobile numbers. Left message to call back.

## 2019-01-24 ENCOUNTER — Encounter: Payer: Self-pay | Admitting: *Deleted

## 2019-01-24 NOTE — Telephone Encounter (Signed)
Patient calling back. He is agreeable to the exercise myoview. He is aware that he will walk on a treadmill and still have an IV for the radioactive isotope part of the test. He is aware of the other recommendations if he is not able to reach his target heart rate. I gave him the preprocedural instructions as listed on the letter in the letter section of his chart.  I am giving letter to scheduler to complete and mail to patient when done. Routing to scheduling.

## 2019-01-24 NOTE — Telephone Encounter (Signed)
Patient scheduled 8/8 for covid test and 8/11   Mailed reminder

## 2019-01-30 ENCOUNTER — Other Ambulatory Visit
Admission: RE | Admit: 2019-01-30 | Discharge: 2019-01-30 | Disposition: A | Discharge status: Home / Self Care | Payer: Medicare Other | Source: Ambulatory Visit | Attending: Internal Medicine | Admitting: Internal Medicine

## 2019-01-30 ENCOUNTER — Other Ambulatory Visit: Payer: Self-pay

## 2019-01-30 DIAGNOSIS — Z01812 Encounter for preprocedural laboratory examination: Secondary | ICD-10-CM | POA: Diagnosis not present

## 2019-01-30 DIAGNOSIS — Z20828 Contact with and (suspected) exposure to other viral communicable diseases: Secondary | ICD-10-CM | POA: Insufficient documentation

## 2019-01-31 LAB — SARS CORONAVIRUS 2 (TAT 6-24 HRS): SARS Coronavirus 2: NEGATIVE

## 2019-02-02 ENCOUNTER — Telehealth: Payer: Self-pay | Admitting: Internal Medicine

## 2019-02-02 ENCOUNTER — Other Ambulatory Visit: Payer: Self-pay

## 2019-02-02 ENCOUNTER — Encounter
Admission: RE | Admit: 2019-02-02 | Discharge: 2019-02-02 | Disposition: A | Discharge status: Home / Self Care | Payer: Medicare Other | Source: Ambulatory Visit | Attending: Internal Medicine | Admitting: Internal Medicine

## 2019-02-02 DIAGNOSIS — I429 Cardiomyopathy, unspecified: Secondary | ICD-10-CM | POA: Insufficient documentation

## 2019-02-02 DIAGNOSIS — R0602 Shortness of breath: Secondary | ICD-10-CM | POA: Insufficient documentation

## 2019-02-02 NOTE — Telephone Encounter (Signed)
Called [facility] and went over the instructions as listed below. He verbalized understanding of these.  [facility] LEXISCAN MYOVIEW   Please note: these test may take anywhere between 2-4 hours to complete  PLEASE REPORT TO [facility] TO GO  Date of Procedure:___09/15/2020____________  Arrival Time for Procedure:____08:15 am________  Instructions regarding medication:    _XX_:  Hold betablocker(CARVEDILOL) night before procedure and morning of procedure  _XX_:  Hold other medications as follows:_____CLONIDINE  PLEASE NOTIFY THE OFFICE AT LEAST 24 HOURS IN ADVANCE IF YOU ARE UNABLE TO KEEP YOUR APPOINTMENT.  [phone] AND  PLEASE NOTIFY NUCLEAR MEDICINE AT [facility] AT LEAST 24 HOURS IN ADVANCE IF YOU ARE UNABLE TO KEEP YOUR APPOINTMENT. [phone]  How to prepare for your Myoview test:  1. Do not eat or drink after midnight 2. No caffeine for 24 hours prior to test 3. No smoking 24 hours prior to test. 4. Your medication may be taken with water.  If your doctor stopped a medication because of this test, do not take that medication. 5. Pants are appropriate. Please wear a short sleeve shirt. 6. No perfume, cologne or lotion. 7. Wear comfortable walking shoes.

## 2019-02-02 NOTE — Telephone Encounter (Signed)
I spoke with Mr. Lamping regarding today's cancelled stress test as well as his concerns about regadenoson.  He reports that his mother in law had prolonged symptoms after receiving a stress test agent several years ago.  After discussing the risks and benefits of pharmacologic stress testing, exercise stress testing, cardiac catheterization, and forgoing additional preop testint, Mr. Ullman is in agreement with proceeding with pharmacologic myocardial perfusion stress test.  We will attempt to schedule this for Tuesday (9/15), so that I can be present for the study.  I advised him to refrain from consuming caffeine as well as smoking for at least (ideally 24 hours) prior to the study.  [name], MD [facility] Pager: [phone]

## 2019-02-06 ENCOUNTER — Ambulatory Visit
Admission: RE | Admit: 2019-02-06 | Discharge: 2019-02-06 | Disposition: A | Discharge status: Home / Self Care | Payer: Medicare Other | Source: Ambulatory Visit | Attending: Internal Medicine | Admitting: Internal Medicine

## 2019-02-06 ENCOUNTER — Other Ambulatory Visit: Payer: Self-pay

## 2019-02-06 DIAGNOSIS — I429 Cardiomyopathy, unspecified: Secondary | ICD-10-CM

## 2019-02-06 DIAGNOSIS — R0602 Shortness of breath: Secondary | ICD-10-CM

## 2019-02-06 MED ORDER — TECHNETIUM TC 99M TETROFOSMIN IV KIT
10.0000 | PACK | Freq: Once | INTRAVENOUS | Status: AC | PRN
  Administered 2019-02-06: 10.47 via INTRAVENOUS

## 2019-02-06 MED ORDER — REGADENOSON 0.4 MG/5ML IV SOLN
0.4000 mg | Freq: Once | INTRAVENOUS | Status: AC
  Administered 2019-02-06: 0.4 mg via INTRAVENOUS
  Filled 2019-02-06: qty 5

## 2019-02-06 MED ORDER — TECHNETIUM TC 99M TETROFOSMIN IV KIT
30.0000 | PACK | Freq: Once | INTRAVENOUS | Status: AC | PRN
  Administered 2019-02-06: 32.5 via INTRAVENOUS

## 2019-02-07 ENCOUNTER — Telehealth: Payer: Self-pay | Admitting: Internal Medicine

## 2019-02-07 LAB — NM MYOCAR MULTI W/SPECT W/WALL MOTION / EF
LV dias vol: 221 mL (ref 62–150)
LV sys vol: 131 mL
Peak HR: 77 {beats}/min
Rest HR: 57 {beats}/min
TID: 0.97

## 2019-02-07 NOTE — Telephone Encounter (Signed)
Attempted to reach patient to discuss result of stress test.  Left voicemail to contact us to review results and discuss preop clearance.  [name], MD [facility] Pager: [phone]

## 2019-02-08 NOTE — Telephone Encounter (Signed)
Patient calling back to go over results. Please advise when able

## 2019-02-08 NOTE — Telephone Encounter (Signed)
LVM for patient to call in and make 3 month followup

## 2019-02-08 NOTE — Telephone Encounter (Signed)
Results of myocardial perfusion stress test were reviewed with Mr. Siska.  It is notable for small areas of possible prior infarct and question of small region of peri-infarct ischemia versus artifact along the inferior wall.  LVEF was moderately reduced, as noted on recent echo.  This is compatible with an intermediate risk study.  Mr. Quinby has stable exertional dyspnea and no chest pain.  Under the circumstances, I think it would be best for him to proceed with his peritoneal dialysis catheter placement, as he does not have any unstable symptoms and additional cardiac intervention is unlikely to further mitigate his perioperative cardiovascular risk.  We will need to continue to focus on risk factor modification, including blood pressure and lipid control.  Continue current dose of carvedilol for his cardiomyopathy.  Renal function precludes addition of an ACE inhibitor/ARB and aldosterone antagonist.  Once dialysis has commenced, we will need to readdress cardiac catheterization, particularly if dyspnea progresses or chest pain were to develop.  I will forward these findings and recommendations to Drs. Dew and Kolluru so that they can move forward with plans for dialysis catheter placement and initiation of peritoneal dialysis.  Mr. Minerd should follow-up with [name] in the office in about 3 months.  [name], MD [facility] Pager: [phone]

## 2019-02-16 ENCOUNTER — Telehealth (INDEPENDENT_AMBULATORY_CARE_PROVIDER_SITE_OTHER): Payer: Self-pay

## 2019-02-16 NOTE — Telephone Encounter (Signed)
I spoke with the patient earlier today so that he could decide on a day to do his surgery. Patient is now scheduled with Dr. Lucky Cowboy for 03/01/2019 and will do his Covid testing and pre-op on 02/26/2019 at 10:00 am at the [facility]. Pre-surgical information will be mailed to the patient's home.

## 2019-02-20 NOTE — Telephone Encounter (Signed)
Follow up on patient appt. Routing to scheduling.

## 2019-02-21 DIAGNOSIS — I10 Essential (primary) hypertension: Secondary | ICD-10-CM | POA: Diagnosis not present

## 2019-02-21 DIAGNOSIS — R809 Proteinuria, unspecified: Secondary | ICD-10-CM | POA: Diagnosis not present

## 2019-02-21 DIAGNOSIS — N185 Chronic kidney disease, stage 5: Secondary | ICD-10-CM | POA: Diagnosis not present

## 2019-02-21 DIAGNOSIS — E1122 Type 2 diabetes mellitus with diabetic chronic kidney disease: Secondary | ICD-10-CM | POA: Diagnosis not present

## 2019-02-21 DIAGNOSIS — N2581 Secondary hyperparathyroidism of renal origin: Secondary | ICD-10-CM | POA: Diagnosis not present

## 2019-02-21 DIAGNOSIS — I129 Hypertensive chronic kidney disease with stage 1 through stage 4 chronic kidney disease, or unspecified chronic kidney disease: Secondary | ICD-10-CM | POA: Diagnosis not present

## 2019-02-21 DIAGNOSIS — D631 Anemia in chronic kidney disease: Secondary | ICD-10-CM | POA: Diagnosis not present

## 2019-02-21 DIAGNOSIS — E875 Hyperkalemia: Secondary | ICD-10-CM | POA: Diagnosis not present

## 2019-02-21 DIAGNOSIS — Z720 Tobacco use: Secondary | ICD-10-CM | POA: Diagnosis not present

## 2019-02-21 DIAGNOSIS — I120 Hypertensive chronic kidney disease with stage 5 chronic kidney disease or end stage renal disease: Secondary | ICD-10-CM | POA: Diagnosis not present

## 2019-02-21 DIAGNOSIS — N2889 Other specified disorders of kidney and ureter: Secondary | ICD-10-CM | POA: Diagnosis not present

## 2019-02-21 NOTE — Telephone Encounter (Signed)
LVM for patient to callback and schedule 3 month fu

## 2019-02-25 ENCOUNTER — Other Ambulatory Visit (INDEPENDENT_AMBULATORY_CARE_PROVIDER_SITE_OTHER): Payer: Self-pay | Admitting: Nurse Practitioner

## 2019-02-26 ENCOUNTER — Other Ambulatory Visit: Payer: Self-pay

## 2019-02-26 ENCOUNTER — Encounter
Admission: RE | Admit: 2019-02-26 | Discharge: 2019-02-26 | Disposition: A | Discharge status: Home / Self Care | Payer: Medicare Other | Source: Ambulatory Visit | Attending: Vascular Surgery | Admitting: Vascular Surgery

## 2019-02-26 DIAGNOSIS — Z20828 Contact with and (suspected) exposure to other viral communicable diseases: Secondary | ICD-10-CM | POA: Insufficient documentation

## 2019-02-26 DIAGNOSIS — Z01818 Encounter for other preprocedural examination: Secondary | ICD-10-CM | POA: Diagnosis not present

## 2019-02-26 HISTORY — DX: Myoneural disorder, unspecified: G70.9

## 2019-02-26 HISTORY — DX: Other complications of anesthesia, initial encounter: T88.59XA

## 2019-02-26 LAB — BASIC METABOLIC PANEL
Anion gap: 11 (ref 5–15)
BUN: 67 mg/dL — ABNORMAL HIGH (ref 8–23)
CO2: 19 mmol/L — ABNORMAL LOW (ref 22–32)
Calcium: 8.8 mg/dL — ABNORMAL LOW (ref 8.9–10.3)
Chloride: 104 mmol/L (ref 98–111)
Creatinine, Ser: 7.15 mg/dL — ABNORMAL HIGH (ref 0.61–1.24)
GFR calc Af Amer: 8 mL/min — ABNORMAL LOW (ref >60)
GFR calc non Af Amer: 7 mL/min — ABNORMAL LOW (ref >60)
Glucose, Bld: 98 mg/dL (ref 70–99)
Potassium: 5 mmol/L (ref 3.5–5.1)
Sodium: 134 mmol/L — ABNORMAL LOW (ref 135–145)

## 2019-02-26 LAB — CBC WITH DIFFERENTIAL/PLATELET
Abs Immature Granulocytes: 0.03 10*3/uL (ref 0.00–0.07)
Basophils Absolute: 0.1 10*3/uL (ref 0.0–0.1)
Basophils Relative: 1 %
Eosinophils Absolute: 2 10*3/uL — ABNORMAL HIGH (ref 0.0–0.5)
Eosinophils Relative: 18 %
HCT: 25 % — ABNORMAL LOW (ref 39.0–52.0)
Hemoglobin: 8.6 g/dL — ABNORMAL LOW (ref 13.0–17.0)
Immature Granulocytes: 0 %
Lymphocytes Relative: 20 %
Lymphs Abs: 2.2 10*3/uL (ref 0.7–4.0)
MCH: 30.1 pg (ref 26.0–34.0)
MCHC: 34.4 g/dL (ref 30.0–36.0)
MCV: 87.4 fL (ref 80.0–100.0)
Monocytes Absolute: 0.6 10*3/uL (ref 0.1–1.0)
Monocytes Relative: 6 %
Neutro Abs: 6.2 10*3/uL (ref 1.7–7.7)
Neutrophils Relative %: 55 %
Platelets: 203 10*3/uL (ref 150–400)
RBC: 2.86 MIL/uL — ABNORMAL LOW (ref 4.22–5.81)
RDW: 13.6 % (ref 11.5–15.5)
WBC: 11.1 10*3/uL — ABNORMAL HIGH (ref 4.0–10.5)
nRBC: 0 % (ref 0.0–0.2)

## 2019-02-26 LAB — SARS CORONAVIRUS 2 (TAT 6-24 HRS): SARS Coronavirus 2: NEGATIVE

## 2019-02-26 LAB — APTT: aPTT: 28 seconds (ref 24–36)

## 2019-02-26 LAB — TYPE AND SCREEN
ABO/RH(D): O POS
Antibody Screen: NEGATIVE

## 2019-02-26 LAB — PROTIME-INR
INR: 1.1 (ref 0.8–1.2)
Prothrombin Time: 13.6 seconds (ref 11.4–15.2)

## 2019-02-26 NOTE — Patient Instructions (Signed)
INSTRUCTIONS FOR SURGERY     Your surgery is scheduled for:   Thursday, October 8TH     To find out your arrival time for the day of surgery,          please call [phone] between 1 pm and 3 pm on :  Wednesday, October 7TH     When you arrive for surgery, report to the [facility].       Do NOT stop on the first floor to register.    REMEMBER: Instructions that are not followed completely may result in serious medical risk,  up to and including death, or upon the discretion of your surgeon and anesthesiologist,            your surgery may need to be rescheduled.  __X__ 1. Do not eat food after midnight the night before your procedure.                    No gum, candy, lozenger, tic tacs, tums or hard candies.                  ABSOLUTELY NOTHING SOLID IN YOUR MOUTH AFTER MIDNIGHT                    You may drink unlimited clear liquids up to 2 hours before you are scheduled to arrive for surgery.                   Do not drink anything within those 2 hours unless you need to take medicine, then take the                   smallest amount you need.  Clear liquids include:  water, apple juice without pulp,                   any flavor Gatorade, Black coffee, black tea.  Sugar may be added but no dairy/ honey /lemon.                        Broth and jello is not considered a clear liquid.  __x__  2. On the morning of surgery, please brush your teeth with toothpaste and water. You may rinse with                  mouthwash if you wish but DO NOT SWALLOW TOOTHPASTE OR MOUTHWASH  __X___3. NO alcohol for 24 hours before or after surgery.  __x___ 4.  Do NOT smoke or use e-cigarettes for 24 HOURS PRIOR TO SURGERY.                      DO NOT Use any chewable tobacco products for at least 6 hours prior to surgery.  __x___ 5. If you start any new medication after this appointment and prior to surgery, please          Bring it with you on the day of surgery.  ___x__ 6. Notify your doctor if there is any change in your medical condition, such as fever, infection, vomitting,  Diarrhea or any open sores.  __x___ 7.  USE the CHG SOAP as instructed, the night before surgery and the day of surgery.                   Once you have washed with this soap, do NOT use any of the following: Powders, perfumes                    or lotions. Please do not wear make up, hairpins, clips or nail polish. You MAY  wear deodorant.                   Men may shave their face and neck.  Women need to shave 48 hours prior to surgery.                   DO NOT wear ANY jewelry on the day of surgery. If there are rings that are too tight to                    remove easily, please address this prior to the surgery day. Piercings need to be removed.                                                                     NO METAL ON YOUR BODY.                    Do NOT bring any valuables.  If you came to Pre-Admit testing then you will not need license,                     insurance card or credit card.  If you will be staying overnight, please either leave your things in                     the car or have your family be responsible for these items.                     [facility] IS NOT RESPONSIBLE FOR BELONGINGS OR VALUABLES.  ___X__ 8. DO NOT wear contact lenses on surgery day.  You may not have dentures,                     Hearing aides, contacts or glasses in the operating room. These items can be                    Placed in the Recovery Room to receive immediately after surgery.  __x___ 9. IF YOU ARE SCHEDULED TO GO HOME ON THE SAME DAY, YOU MUST                   Have someone to drive you home and to stay with you  for the first 24 hours.                    Have an arrangement prior to arriving on surgery day.  ___x__ 10. Take the following medications on the morning of surgery with a sip of water:  1. CARVEDILOL                     2. CLONIDINE                     3. AMLODIPINE                     4.                     5.                     6.  _____ 11.  Follow any instructions provided to you by your surgeon.                        Such as enema, clear liquid bowel prep  __X__  12. STOP ASPIRIN AS OF: TODAY                       THIS INCLUDES BC POWDERS / GOODIES POWDER  __x___ 13. STOP Anti-inflammatories as of: TODAY                      This includes IBUPROFEN / MOTRIN / ADVIL / ALEVE/ NAPROXYN                    YOU MAY TAKE TYLENOL ANY TIME PRIOR TO SURGERY.  __X___ 14.  Stop supplements until after surgery.                     This includes: CRANBERRY // COQ10                  You may continue taking FERROUS SULFATE //  SODIUM BICARB but do not take on the morning of surgery.  _____ 15. Bring your CPAP machine into preop with you on the morning of surgery.   ______16. If staying overnight, please have appropriate shoes to wear to be able to walk around the unit.                   Wear clean and comfortable clothing to the hospital.  [place].  PLEASE BRING A COPY OF YOUR POA , LIVING WILL WITH YOU TO THE HOSPITAL SO THAT WE CAN    PLACE A COPY IN YOUR CHART.

## 2019-02-27 ENCOUNTER — Other Ambulatory Visit: Payer: Self-pay

## 2019-02-27 MED ORDER — AMLODIPINE BESYLATE 10 MG PO TABS: 10.0000 mg | ORAL_TABLET | Freq: Every day | ORAL | 1 refills | Status: DC

## 2019-02-28 MED ORDER — CEFAZOLIN SODIUM-DEXTROSE 1-4 GM/50ML-% IV SOLN
1.0000 g | INTRAVENOUS | Status: AC
  Administered 2019-03-01: 2 g via INTRAVENOUS

## 2019-03-01 ENCOUNTER — Other Ambulatory Visit: Payer: Self-pay

## 2019-03-01 ENCOUNTER — Encounter: Payer: Self-pay | Admitting: *Deleted

## 2019-03-01 ENCOUNTER — Encounter
Admission: RE | Disposition: A | Discharge status: Home / Self Care | Payer: Self-pay | Source: Home / Self Care | Attending: Vascular Surgery

## 2019-03-01 ENCOUNTER — Ambulatory Visit
Admission: RE | Admit: 2019-03-01 | Discharge: 2019-03-01 | Disposition: A | Discharge status: Home / Self Care | Payer: Medicare Other | Attending: Vascular Surgery | Admitting: Vascular Surgery

## 2019-03-01 ENCOUNTER — Ambulatory Visit: Payer: Medicare Other | Admitting: Certified Registered"

## 2019-03-01 DIAGNOSIS — I120 Hypertensive chronic kidney disease with stage 5 chronic kidney disease or end stage renal disease: Secondary | ICD-10-CM | POA: Diagnosis not present

## 2019-03-01 DIAGNOSIS — E1122 Type 2 diabetes mellitus with diabetic chronic kidney disease: Secondary | ICD-10-CM | POA: Diagnosis not present

## 2019-03-01 DIAGNOSIS — Z823 Family history of stroke: Secondary | ICD-10-CM | POA: Diagnosis not present

## 2019-03-01 DIAGNOSIS — F17210 Nicotine dependence, cigarettes, uncomplicated: Secondary | ICD-10-CM | POA: Insufficient documentation

## 2019-03-01 DIAGNOSIS — Z888 Allergy status to other drugs, medicaments and biological substances status: Secondary | ICD-10-CM | POA: Diagnosis not present

## 2019-03-01 DIAGNOSIS — Z85528 Personal history of other malignant neoplasm of kidney: Secondary | ICD-10-CM | POA: Insufficient documentation

## 2019-03-01 DIAGNOSIS — N185 Chronic kidney disease, stage 5: Secondary | ICD-10-CM | POA: Insufficient documentation

## 2019-03-01 DIAGNOSIS — J449 Chronic obstructive pulmonary disease, unspecified: Secondary | ICD-10-CM | POA: Insufficient documentation

## 2019-03-01 DIAGNOSIS — N186 End stage renal disease: Secondary | ICD-10-CM | POA: Diagnosis not present

## 2019-03-01 DIAGNOSIS — Z8249 Family history of ischemic heart disease and other diseases of the circulatory system: Secondary | ICD-10-CM | POA: Insufficient documentation

## 2019-03-01 DIAGNOSIS — E785 Hyperlipidemia, unspecified: Secondary | ICD-10-CM | POA: Insufficient documentation

## 2019-03-01 DIAGNOSIS — Z8546 Personal history of malignant neoplasm of prostate: Secondary | ICD-10-CM | POA: Insufficient documentation

## 2019-03-01 DIAGNOSIS — E1140 Type 2 diabetes mellitus with diabetic neuropathy, unspecified: Secondary | ICD-10-CM | POA: Insufficient documentation

## 2019-03-01 DIAGNOSIS — Z79899 Other long term (current) drug therapy: Secondary | ICD-10-CM | POA: Insufficient documentation

## 2019-03-01 DIAGNOSIS — Z8673 Personal history of transient ischemic attack (TIA), and cerebral infarction without residual deficits: Secondary | ICD-10-CM | POA: Diagnosis not present

## 2019-03-01 DIAGNOSIS — E1151 Type 2 diabetes mellitus with diabetic peripheral angiopathy without gangrene: Secondary | ICD-10-CM | POA: Insufficient documentation

## 2019-03-01 DIAGNOSIS — J441 Chronic obstructive pulmonary disease with (acute) exacerbation: Secondary | ICD-10-CM | POA: Diagnosis not present

## 2019-03-01 HISTORY — PX: CAPD INSERTION: SHX5233

## 2019-03-01 LAB — GLUCOSE, CAPILLARY
Glucose-Capillary: 93 mg/dL (ref 70–99)
Glucose-Capillary: 110 mg/dL — ABNORMAL HIGH (ref 70–99)

## 2019-03-01 LAB — ABO/RH: ABO/RH(D): O POS

## 2019-03-01 SURGERY — LAPAROSCOPIC INSERTION CONTINUOUS AMBULATORY PERITONEAL DIALYSIS  (CAPD) CATHETER
Anesthesia: General | Site: Abdomen

## 2019-03-01 MED ORDER — HYDROCODONE-ACETAMINOPHEN 5-325 MG PO TABS
1.0000 | ORAL_TABLET | Freq: Once | ORAL | Status: AC
  Administered 2019-03-01: 15:00:00 1 via ORAL

## 2019-03-01 MED ORDER — PROPOFOL 10 MG/ML IV BOLUS
INTRAVENOUS | Status: DC | PRN
  Administered 2019-03-01: 100 mg via INTRAVENOUS
  Administered 2019-03-01: 40 mg via INTRAVENOUS
  Administered 2019-03-01: 20 mg via INTRAVENOUS

## 2019-03-01 MED ORDER — HYDRALAZINE HCL 20 MG/ML IJ SOLN
INTRAMUSCULAR | Status: AC
  Administered 2019-03-01: 10 mg via INTRAVENOUS
  Filled 2019-03-01: qty 1

## 2019-03-01 MED ORDER — CEFAZOLIN SODIUM-DEXTROSE 2-4 GM/100ML-% IV SOLN
INTRAVENOUS | Status: AC
  Filled 2019-03-01: qty 100

## 2019-03-01 MED ORDER — SUCCINYLCHOLINE CHLORIDE 20 MG/ML IJ SOLN
INTRAMUSCULAR | Status: DC | PRN
  Administered 2019-03-01: 60 mg via INTRAVENOUS

## 2019-03-01 MED ORDER — SODIUM CHLORIDE 0.9 % IV SOLN
INTRAVENOUS | Status: DC
  Administered 2019-03-01: 13:00:00 via INTRAVENOUS

## 2019-03-01 MED ORDER — HYDRALAZINE HCL 20 MG/ML IJ SOLN
10.0000 mg | Freq: Once | INTRAMUSCULAR | Status: AC
  Administered 2019-03-01: 15:00:00 10 mg via INTRAVENOUS

## 2019-03-01 MED ORDER — FENTANYL CITRATE (PF) 100 MCG/2ML IJ SOLN
INTRAMUSCULAR | Status: AC
  Filled 2019-03-01: qty 2

## 2019-03-01 MED ORDER — ONDANSETRON HCL 4 MG/2ML IJ SOLN
4.0000 mg | Freq: Four times a day (QID) | INTRAMUSCULAR | Status: DC | PRN
  Administered 2019-03-01: 4 mg via INTRAVENOUS

## 2019-03-01 MED ORDER — HYDROMORPHONE HCL 1 MG/ML IJ SOLN: 1.0000 mg | Freq: Once | INTRAMUSCULAR | Status: DC | PRN

## 2019-03-01 MED ORDER — LIDOCAINE HCL (CARDIAC) PF 100 MG/5ML IV SOSY
PREFILLED_SYRINGE | INTRAVENOUS | Status: DC | PRN
  Administered 2019-03-01: 60 mg via INTRAVENOUS

## 2019-03-01 MED ORDER — PROPOFOL 10 MG/ML IV BOLUS
INTRAVENOUS | Status: AC
  Filled 2019-03-01: qty 20

## 2019-03-01 MED ORDER — DEXAMETHASONE SODIUM PHOSPHATE 10 MG/ML IJ SOLN
INTRAMUSCULAR | Status: DC | PRN
  Administered 2019-03-01: 5 mg via INTRAVENOUS

## 2019-03-01 MED ORDER — EPHEDRINE SULFATE 50 MG/ML IJ SOLN
INTRAMUSCULAR | Status: DC | PRN
  Administered 2019-03-01 (×2): 10 mg via INTRAVENOUS

## 2019-03-01 MED ORDER — FENTANYL CITRATE (PF) 100 MCG/2ML IJ SOLN
INTRAMUSCULAR | Status: DC | PRN
  Administered 2019-03-01 (×2): 50 ug via INTRAVENOUS

## 2019-03-01 MED ORDER — CHLORHEXIDINE GLUCONATE CLOTH 2 % EX PADS: 6.0000 | MEDICATED_PAD | Freq: Once | CUTANEOUS | Status: DC

## 2019-03-01 MED ORDER — NEOSTIGMINE METHYLSULFATE 10 MG/10ML IV SOLN
INTRAVENOUS | Status: DC | PRN
  Administered 2019-03-01: 3 mg via INTRAVENOUS

## 2019-03-01 MED ORDER — HYDROCODONE-ACETAMINOPHEN 5-325 MG PO TABS: 1.0000 | ORAL_TABLET | Freq: Four times a day (QID) | ORAL | 0 refills | Status: DC | PRN

## 2019-03-01 MED ORDER — BACITRACIN ZINC 500 UNIT/GM EX OINT
TOPICAL_OINTMENT | CUTANEOUS | Status: AC
  Filled 2019-03-01: qty 28.35

## 2019-03-01 MED ORDER — FAMOTIDINE 20 MG PO TABS
20.0000 mg | ORAL_TABLET | Freq: Once | ORAL | Status: AC
  Administered 2019-03-01: 11:00:00 20 mg via ORAL

## 2019-03-01 MED ORDER — GLYCOPYRROLATE 0.2 MG/ML IJ SOLN
INTRAMUSCULAR | Status: DC | PRN
  Administered 2019-03-01: 0.1 mg via INTRAVENOUS
  Administered 2019-03-01: 0.4 mg via INTRAVENOUS

## 2019-03-01 MED ORDER — FAMOTIDINE 20 MG PO TABS
ORAL_TABLET | ORAL | Status: AC
  Administered 2019-03-01: 11:00:00 20 mg via ORAL
  Filled 2019-03-01: qty 1

## 2019-03-01 MED ORDER — HYDROCODONE-ACETAMINOPHEN 5-325 MG PO TABS
ORAL_TABLET | ORAL | Status: AC
  Filled 2019-03-01: qty 1

## 2019-03-01 MED ORDER — FENTANYL CITRATE (PF) 100 MCG/2ML IJ SOLN
25.0000 ug | INTRAMUSCULAR | Status: DC | PRN
  Administered 2019-03-01: 15:00:00 25 ug via INTRAVENOUS

## 2019-03-01 MED ORDER — HYDRALAZINE HCL 20 MG/ML IJ SOLN
10.0000 mg | Freq: Once | INTRAMUSCULAR | Status: AC
  Administered 2019-03-01: 14:00:00 10 mg via INTRAVENOUS

## 2019-03-01 MED ORDER — ONDANSETRON HCL 4 MG/2ML IJ SOLN: 4.0000 mg | Freq: Once | INTRAMUSCULAR | Status: DC | PRN

## 2019-03-01 MED ORDER — ROCURONIUM BROMIDE 100 MG/10ML IV SOLN
INTRAVENOUS | Status: DC | PRN
  Administered 2019-03-01 (×3): 10 mg via INTRAVENOUS

## 2019-03-01 SURGICAL SUPPLY — 33 items
ADAPTER BETA CAP QUINTON DIALY (ADAPTER) IMPLANT
ADAPTER CATH DIALYSIS 18.75 (CATHETERS) ×2 IMPLANT
CANISTER SUCT 1200ML W/VALVE (MISCELLANEOUS) ×2 IMPLANT
CATH DLYS SWAN NECK 62.5CM (CATHETERS) ×2 IMPLANT
CHLORAPREP W/TINT 26 (MISCELLANEOUS) ×2 IMPLANT
COVER WAND RF STERILE (DRAPES) ×2 IMPLANT
DERMABOND ADVANCED (GAUZE/BANDAGES/DRESSINGS) ×2
DERMABOND ADVANCED .7 DNX12 (GAUZE/BANDAGES/DRESSINGS) ×1 IMPLANT
ELECT CAUTERY BLADE 6.4 (BLADE) ×2 IMPLANT
ELECT REM PT RETURN 9FT ADLT (ELECTROSURGICAL) ×2
ELECTRODE REM PT RTRN 9FT ADLT (ELECTROSURGICAL) ×1 IMPLANT
GLOVE BIO SURGEON STRL SZ7 (GLOVE) ×4 IMPLANT
GLOVE INDICATOR 7.5 STRL GRN (GLOVE) ×2 IMPLANT
GOWN STRL REUS W/ TWL LRG LVL3 (GOWN DISPOSABLE) ×2 IMPLANT
GOWN STRL REUS W/ TWL XL LVL3 (GOWN DISPOSABLE) ×1 IMPLANT
GOWN STRL REUS W/TWL LRG LVL3 (GOWN DISPOSABLE) ×2
GOWN STRL REUS W/TWL XL LVL3 (GOWN DISPOSABLE) ×1
IV NS 500ML (IV SOLUTION) ×1
IV NS 500ML BAXH (IV SOLUTION) ×1 IMPLANT
KIT TURNOVER KIT A (KITS) ×2 IMPLANT
LABEL OR SOLS (LABEL) ×2 IMPLANT
MINICAP W/POVIDONE IODINE SOL (MISCELLANEOUS) ×2 IMPLANT
PACK LAP CHOLECYSTECTOMY (MISCELLANEOUS) ×2 IMPLANT
PENCIL ELECTRO HAND CTR (MISCELLANEOUS) ×2 IMPLANT
SET CYSTO W/LG BORE CLAMP LF (SET/KITS/TRAYS/PACK) ×2 IMPLANT
SET TRANSFER 6 W/TWIST CLAMP 5 (SET/KITS/TRAYS/PACK) ×2 IMPLANT
SET TUBE SMOKE EVAC HIGH FLOW (TUBING) ×3 IMPLANT
SPONGE DRAIN TRACH 4X4 STRL 2S (GAUZE/BANDAGES/DRESSINGS) ×2 IMPLANT
SUT MNCRL AB 4-0 PS2 18 (SUTURE) ×2 IMPLANT
SUT VIC AB 2-0 UR6 27 (SUTURE) ×2 IMPLANT
SUT VICRYL+ 3-0 36IN CT-1 (SUTURE) ×2 IMPLANT
TROCAR XCEL NON-BLD 11X100MML (ENDOMECHANICALS) ×2 IMPLANT
TROCAR XCEL NON-BLD 5MMX100MML (ENDOMECHANICALS) IMPLANT

## 2019-03-01 NOTE — Anesthesia Postprocedure Evaluation (Signed)
Anesthesia Post Note  Patient: [name]  Procedure(s) Performed: LAPAROSCOPIC INSERTION CONTINUOUS AMBULATORY PERITONEAL DIALYSIS  (CAPD) CATHETER (N/A Abdomen)  Patient location during evaluation: PACU Anesthesia Type: General Level of consciousness: awake and alert Pain management: pain level controlled Vital Signs Assessment: post-procedure vital signs reviewed and stable Respiratory status: spontaneous breathing, nonlabored ventilation, respiratory function stable and patient connected to nasal cannula oxygen Cardiovascular status: blood pressure returned to baseline and stable Postop Assessment: no apparent nausea or vomiting Anesthetic complications: no     Last Vitals:  Vitals:   03/01/19 1507 03/01/19 1508  BP: (!) 187/77 (!) 177/65  Pulse: 62 62  Resp: 18   Temp:    SpO2: 98%     Last Pain:  Vitals:   03/01/19 1457  TempSrc:   PainSc: 2                  Martha Clan

## 2019-03-01 NOTE — Discharge Instructions (Signed)

## 2019-03-01 NOTE — Transfer of Care (Signed)
Immediate Anesthesia Transfer of Care Note  Patient: [name]  Procedure(s) Performed: LAPAROSCOPIC INSERTION CONTINUOUS AMBULATORY PERITONEAL DIALYSIS  (CAPD) CATHETER (N/A Abdomen)  Patient Location: PACU  Anesthesia Type:General  Level of Consciousness: drowsy  Airway & Oxygen Therapy: Patient Spontanous Breathing and Patient connected to face mask oxygen  Post-op Assessment: Report given to RN and Post -op Vital signs reviewed and stable  Post vital signs: Reviewed and stable  Last Vitals:  Vitals Value Taken Time  BP 204/89 03/01/19 1357  Temp 36.4 C 03/01/19 1357  Pulse 82 03/01/19 1359  Resp 20 03/01/19 1359  SpO2 100 % 03/01/19 1359  Vitals shown include unvalidated device data.  Last Pain:  Vitals:   03/01/19 1357  TempSrc:   PainSc: Asleep         Complications: No apparent anesthesia complications

## 2019-03-01 NOTE — Op Note (Signed)
  OPERATIVE NOTE   PROCEDURE: 1. Laparoscopic peritoneal dialysis catheter placement.  PRE-OPERATIVE DIAGNOSIS: 1. CKD stage 5   POST-OPERATIVE DIAGNOSIS: Same  SURGEON: Leotis Pain, MD  ASSISTANT(S): Hezzie Bump, PA-C  ANESTHESIA: general  ESTIMATED BLOOD LOSS: Minimal   FINDING(S): 1. Fairly extensive abdominal wall adhesions and intraperitoneal adhesions.  SPECIMEN(S): None  INDICATIONS:  Patient presents with advanced renal failure nearing dialysis dependence. The patient has decided to do peritoneal dialysis for his long-term dialysis. Risks and benefits of placement were discussed and he is agreeable to proceed.  Differences between peritoneal dialysis and hemodialysis were discussed.  An assistant was present during the procedure to help facilitate the exposure and expedite the procedure.  DESCRIPTION: After obtaining full informed written consent, the patient was brought back to the operating room and placed supine upon the operating table. The patient received IV antibiotics prior to induction. After obtaining adequate anesthesia, the abdomen was prepped and draped in the standard fashion. The assistant provided retraction and mobilization to help facilitate exposure and expedite the procedure throughout the entire procedure.  This included following suture, using retractors, and optimizing lighting. A small transverse incision was created just to the left of the umbilicus and we dissected down to the fascia and placed a pursestring Vicryl suture. I then entered the peritoneum with an 85mm Optiview trocar placed in the right upper quadrant and insufflated the abdomen with carbon dioxide.  With this, there were fairly extensive nominal wall and intraperitoneal adhesions.  We could not visualize the trocar entry site or the pelvis well from this right upper quadrant incision.  We could visualize the left side of the abdomen and under direct visualization a 5 mm trocar was  placed into the peritoneal cavity.  We then switched to a 5 mm camera through this left lower quadrant trocar.  This allowed visualization of our abdominal entry site near the umbilicus.  There were adhesions very near this, but we were free and clear from the intestinal adhesions to the abdominal wall in that area.  We visualized the placement of the catheter the entire time.  I then entered the peritoneum just beside the umbilicus with a trocar and the peritoneal dialysis catheter under direct visualization. The coiled portion of the catheter was parked into the pelvis under direct laparoscopic guidance.  The pelvis also had some significant adhesions and the catheter would not tucked deep into the pelvis.  We were able to visualize this down into the pelvis as best possible.  The deep cuff was secured to the fascial pursestring suture. A small counterincision was made in the left abdomen and the catheter was brought out this site. The appropriate distal connectors were placed, and I then placed 500 cc of saline through the catheter into the pelvis. The abdomen was desufflated. Immediately, 350 cc of effluent returned through the catheter when the bag was placed to gravity. I took one more look with the camera to ensure that the catheter was in the pelvis and it was. The 25mm trocar was then removed. I then closed the incisions with 3-0 Vicryl and 4-0 Monocryl and placed Dermabond as dressing. Dry dressing was placed around the catheter exit site. The patient was then awakened from anesthesia and taken to the recovery room in stable condition having tolerated the procedure well.  COMPLICATIONS: None  CONDITION: None  Leotis Pain, MD 03/01/2019 1:44 PM   This note was created with Dragon Medical transcription system. Any errors in dictation are purely unintentional.

## 2019-03-01 NOTE — Anesthesia Preprocedure Evaluation (Addendum)
Anesthesia Evaluation  Patient identified by MRN, date of birth, ID band Patient awake    Reviewed: Allergy & Precautions, NPO status , Patient's Chart, lab work & pertinent test results  Airway Mallampati: III       Dental   Pulmonary sleep apnea , COPD, Current Smoker and Patient abstained from smoking.,    Pulmonary exam normal        Cardiovascular hypertension, + Peripheral Vascular Disease and + DOE  Normal cardiovascular exam     Neuro/Psych  Headaches, PSYCHIATRIC DISORDERS Depression TIA Neuromuscular disease    GI/Hepatic Neg liver ROS,   Endo/Other  diabetes  Renal/GU CRFRenal disease  negative genitourinary   Musculoskeletal   Abdominal Normal abdominal exam  (+)   Peds negative pediatric ROS (+)  Hematology  (+) anemia ,   Anesthesia Other Findings Past Medical History: 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 Kathyrn Sheriff in 1997 Feb 2009: PAD (peripheral artery disease) ([place])     Comment:  nonobstructing, renal angiogram [name] 2004: Renal cell carcinoma     Comment:  left kidney heminephrectomy No date: TIA (transient ischemic attack)     Comment:  no residual but left leg and foot still feel heavy No date: tobacco abuse  Echo:  EF 45 -50%  Reproductive/Obstetrics                          Anesthesia Physical Anesthesia Plan  ASA: III  Anesthesia Plan: General   Post-op Pain Management:    Induction: Intravenous  PONV Risk Score and  Plan:   Airway Management Planned: Oral ETT  Additional Equipment:   Intra-op Plan:   Post-operative Plan: Extubation in OR  Informed Consent: I have reviewed the patients History and Physical, chart, labs and discussed the procedure including the risks, benefits and alternatives for the proposed anesthesia with the patient or authorized representative who has indicated his/her understanding and acceptance.     Dental advisory given  Plan Discussed with: CRNA and Surgeon  Anesthesia Plan Comments:         Anesthesia Quick Evaluation

## 2019-03-01 NOTE — Anesthesia Post-op Follow-up Note (Signed)
Anesthesia QCDR form completed.        

## 2019-03-01 NOTE — Anesthesia Procedure Notes (Signed)
Procedure Name: Intubation Date/Time: 03/01/2019 12:57 PM Performed by: Cory Munch, RN Pre-anesthesia Checklist: Patient identified, Emergency Drugs available, Suction available and Patient being monitored Patient Re-evaluated:Patient Re-evaluated prior to induction Oxygen Delivery Method: Circle system utilized Preoxygenation: Pre-oxygenation with 100% oxygen Induction Type: IV induction Ventilation: Mask ventilation without difficulty and Oral airway inserted - appropriate to patient size Laryngoscope Size: McGraph and 4 Grade View: Grade II Tube type: Oral Tube size: 7.5 mm Number of attempts: 1 Airway Equipment and Method: Stylet and Oral airway Placement Confirmation: ETT inserted through vocal cords under direct vision,  positive ETCO2 and breath sounds checked- equal and bilateral Secured at: 22 cm Tube secured with: Tape Dental Injury: Teeth and Oropharynx as per pre-operative assessment

## 2019-03-01 NOTE — H&P (Signed)
[facility] Admission History & Physical  MRN : [id number]  DURWIN DAVISSON is a 73 y.o. (20-Jun-1945) male who presents with chief complaint of No chief complaint on file. Marland Kitchen  History of Present Illness: Patient presents today for placement of his peritoneal dialysis catheter placement.  He has advanced chronic kidney disease with a GFR less than 10.  We saw him in the office a few months ago, and he had delays in getting his clearance but is here today after finally getting his clearance for surgery.  No new complaints.  Not on dialysis.  Has a previous history of kidney surgery and understands this could limit peritoneal dialysis function.  No fevers or chills.  Current Facility-Administered Medications  Medication Dose Route Frequency Provider Last Rate Last Dose  . 0.9 %  sodium chloride infusion   Intravenous Continuous [place], Amy, MD      . ceFAZolin (ANCEF) 2-4 GM/100ML-% IVPB           . ceFAZolin (ANCEF) IVPB 1 g/50 mL premix  1 g Intravenous On Call to [place], [place], NP      . Chlorhexidine Gluconate Cloth 2 % PADS 6 each  6 each Topical Once Kris Hartmann, NP       And  . Chlorhexidine Gluconate Cloth 2 % PADS 6 each  6 each Topical Once Eulogio Ditch E, NP      . HYDROmorphone (DILAUDID) injection 1 mg  1 mg Intravenous Once PRN Kris Hartmann, NP      . ondansetron (ZOFRAN) injection 4 mg  4 mg Intravenous Q6H PRN Kris Hartmann, NP        Past Medical History:  Diagnosis Date  . 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 Complication of anesthesia    had to be woken up slowly as his bp was elevated when did this quickly  . Hyperlipidemia   . Hypertension   . Migraine    cluster  . Neuromuscular disorder ([place])    left lower extrem neuropathy  . Obstructive sleep apnea    no OSA since had facial surgery with dr. Kathyrn Sheriff in 1997  . PAD (peripheral artery disease) [facility]) Feb 2009   nonobstructing,  renal angiogram ([place])  . Renal cell carcinoma 2004   left kidney heminephrectomy  . TIA (transient ischemic attack)    no residual but left leg and foot still feel heavy  . tobacco abuse     Past Surgical History:  Procedure Laterality Date  . CARDIAC CATHETERIZATION     Dr. [name] did this to assess his renal artery  . cyst removal  12/25/2015   Spine L4 and L5  . heminephrectomy  2004   for renal cell CA  . RENAL CRYOABLATION  Jan 2006   right kidney,  [name]  . sciatica      Social History Social History   Tobacco Use  . Smoking status: Current Every Day Smoker    Packs/day: 1.00    Years: 50.00    Pack years: 50.00    Types: Cigarettes  . Smokeless tobacco: Never Used  Substance Use Topics  . Alcohol use: Not Currently  . Drug use: No    Family History Family History  Problem Relation Age of Onset  . Hypertension Mother   . Cancer Mother        breast  . Aneurysm Mother   . Coronary artery  disease Father   . Hypertension Father   . Stroke Father 69  . Heart disease Father   . Heart attack Father 70  . Aneurysm Maternal Grandmother        brain  . Aneurysm Paternal Grandmother        brain  . Coronary artery disease Paternal Grandfather   . Heart disease Brother        valvular heart disease  . COPD Brother   . Hypertension Brother   . Stroke Paternal Uncle     Allergies  Allergen Reactions  . Irbesartan Other (See Comments)    hyperkalemia  . Atorvastatin Other (See Comments)    Muscle pain  . Bystolic [Nebivolol Hcl]     Extreme fatigue     REVIEW OF SYSTEMS (Negative unless checked)  Constitutional: [] ?Weight loss  [] ?Fever  [] ?Chills Cardiac: [] ?Chest pain   [] ?Chest pressure   [] ?Palpitations   [] ?Shortness of breath when laying flat   [] ?Shortness of breath at rest   [] ?Shortness of breath with exertion. Vascular:  [] ?Pain in legs with walking   [] ?Pain in legs at rest   [] ?Pain in legs when laying flat   [] ?Claudication   [] ?Pain  in feet when walking  [] ?Pain in feet at rest  [] ?Pain in feet when laying flat   [] ?History of DVT   [] ?Phlebitis   [] ?Swelling in legs   [] ?Varicose veins   [] ?Non-healing ulcers Pulmonary:   [] ?Uses home oxygen   [] ?Productive cough   [] ?Hemoptysis   [] ?Wheeze  [] ?COPD   [] ?Asthma Neurologic:  [] ?Dizziness  [] ?Blackouts   [] ?Seizures   [] ?History of stroke   [x] ?History of TIA  [] ?Aphasia   [] ?Temporary blindness   [] ?Dysphagia   [] ?Weakness or numbness in arms   [] ?Weakness or numbness in legs Musculoskeletal:  [x] ?Arthritis   [] ?Joint swelling   [] ?Joint pain   [] ?Low back pain Hematologic:  [] ?Easy bruising  [] ?Easy bleeding   [] ?Hypercoagulable state   [x] ?Anemic  [] ?Hepatitis Gastrointestinal:  [] ?Blood in stool   [] ?Vomiting blood  [] ?Gastroesophageal reflux/heartburn   [] ?Abdominal pain Genitourinary:  [x] ?Chronic kidney disease   [x] ?Difficult urination  [] ?Frequent urination  [] ?Burning with urination   [] ?Hematuria Skin:  [] ?Rashes   [] ?Ulcers   [] ?Wounds Psychological:  [] ?History of anxiety   [] ? History of major depression.   Physical Examination  Vitals:   03/01/19 1035  BP: (!) 160/74  Pulse: 64  Resp: 16  Temp: 98 F (36.7 C)  TempSrc: Oral  SpO2: 100%  Weight: 65 kg  Height: 5\' 6"  (1.676 m)   Body mass index is 23.13 kg/m. Gen: WD/WN, NAD Head: [place]/AT, No temporalis wasting Ear/Nose/Throat: Hearing grossly intact, nares w/o erythema or drainage, oropharynx w/o Erythema/Exudate,  Eyes: Conjunctiva clear, sclera non-icteric Neck: Trachea midline.  No JVD.  Pulmonary:  Good air movement, respirations not labored, no use of accessory muscles.  Cardiac: RRR, normal S1, S2. Vascular:  Vessel Right Left  Radial Palpable Palpable                                   Gastrointestinal: soft, non-tender/non-distended. No guarding/reflex.  Musculoskeletal: M/S 5/5 throughout.  Extremities without ischemic changes.  No deformity or atrophy.  Neurologic: Sensation  grossly intact in extremities.  Symmetrical.  Speech is fluent. Motor exam as listed above. Psychiatric: Judgment intact, Mood & affect appropriate for pt's clinical situation. Dermatologic: No rashes or ulcers noted.  No cellulitis  or open wounds.      CBC Lab Results  Component Value Date   WBC 11.1 (H) 02/26/2019   HGB 8.6 (L) 02/26/2019   HCT 25.0 (L) 02/26/2019   MCV 87.4 02/26/2019   PLT 203 02/26/2019    BMET    Component Value Date/Time   NA 134 (L) 02/26/2019 1115   NA 136 (A) 04/19/2018   NA 139 07/04/2012 1117   K 5.0 02/26/2019 1115   K 4.3 07/04/2012 1117   CL 104 02/26/2019 1115   CL 102 07/04/2012 1117   CO2 19 (L) 02/26/2019 1115   CO2 29 07/04/2012 1117   GLUCOSE 98 02/26/2019 1115   GLUCOSE 88 07/04/2012 1117   BUN 67 (H) 02/26/2019 1115   BUN 44 (A) 04/19/2018   BUN 15 07/04/2012 1117   CREATININE 7.15 (H) 02/26/2019 1115   CREATININE 1.92 (H) 07/18/2015 1549   CALCIUM 8.8 (L) 02/26/2019 1115   CALCIUM 9.1 07/04/2012 1117   GFRNONAA 7 (L) 02/26/2019 1115   GFRNONAA 40 (L) 07/04/2012 1117   GFRAA 8 (L) 02/26/2019 1115   GFRAA 47 (L) 07/04/2012 1117   Estimated Creatinine Clearance: 8.3 mL/min (A) (by C-G formula based on SCr of 7.15 mg/dL (H)).  COAG Lab Results  Component Value Date   INR 1.1 02/26/2019   INR 0.9 01/03/2012    Radiology Nm Myocar Multi W/spect W/wall Motion / Ef  Result Date: 02/07/2019  Abnormal pharmacologic myocardial perfusion stress test.  There is a small in size, mild in severity, fixed basal and mid anterolateral defect that most likely represents scar and less likely artifact.  There is a small in size, mild in severity, minimally reversible mid and apical inferior defect that most likely represents scar. An element of periinfact ischemia and/or artifact cannot be excluded.  The left ventricular ejection fraction is moderately decreased (30-44%) with global hypokinesis. The left ventricle is dilated.   Attenuation correction CT shows coronary artery and aortic atherosclerocic calcifications.  Multiple bilateral renal hypodensities are noted, incompletely characterized. Hyperdensity along the upper pole of the left kidney is indeterminate.  This is an intermediate risk study.       Assessment/Plan   Essential hypertension blood pressure control important in reducing the progression of atherosclerotic disease. On appropriate oral medications.   DM (diabetes mellitus), type 2 with renal complications This is no underlying cause of his renal insufficiency and blood glucose control important in reducing the progression of atherosclerotic disease. Also, involved in wound healing. On appropriate medications.   Hyperlipidemia lipid control important in reducing the progression of atherosclerotic disease. Continue statin therapy   CKD (chronic kidney disease), stage V (HCC)  Recommend:  The patient has advanced renal disease but currently does not have dialysis access.  The patient has elected to move forward with peritoneal dialysis. Although there is a history of prior operations, this does not appear at this time to be a prohibitive situation.  This was also discussed.  Risk and benefits were reviewed the patient.  Indications for the procedure were reviewed.  All questions were answered, the patient agrees to proceed with laparoscopic PD catheter insertion. Leotis Pain, MD  03/01/2019 11:29 AM

## 2019-03-02 ENCOUNTER — Encounter: Payer: Self-pay | Admitting: Vascular Surgery

## 2019-03-13 DIAGNOSIS — E519 Thiamine deficiency, unspecified: Secondary | ICD-10-CM | POA: Diagnosis not present

## 2019-03-13 DIAGNOSIS — R200 Anesthesia of skin: Secondary | ICD-10-CM | POA: Diagnosis not present

## 2019-03-13 DIAGNOSIS — N183 Chronic kidney disease, stage 3 unspecified: Secondary | ICD-10-CM | POA: Diagnosis not present

## 2019-03-13 DIAGNOSIS — R202 Paresthesia of skin: Secondary | ICD-10-CM | POA: Diagnosis not present

## 2019-03-13 DIAGNOSIS — E531 Pyridoxine deficiency: Secondary | ICD-10-CM | POA: Diagnosis not present

## 2019-03-13 DIAGNOSIS — E538 Deficiency of other specified B group vitamins: Secondary | ICD-10-CM | POA: Diagnosis not present

## 2019-03-13 DIAGNOSIS — E559 Vitamin D deficiency, unspecified: Secondary | ICD-10-CM | POA: Diagnosis not present

## 2019-03-22 ENCOUNTER — Other Ambulatory Visit: Payer: Self-pay

## 2019-03-22 ENCOUNTER — Ambulatory Visit (INDEPENDENT_AMBULATORY_CARE_PROVIDER_SITE_OTHER): Payer: Medicare Other | Admitting: Nurse Practitioner

## 2019-03-22 ENCOUNTER — Encounter (INDEPENDENT_AMBULATORY_CARE_PROVIDER_SITE_OTHER): Payer: Self-pay | Admitting: Nurse Practitioner

## 2019-03-22 VITALS — BP 226/96 | HR 73 | Resp 17 | Ht 66.0 in | Wt 142.0 lb

## 2019-03-22 DIAGNOSIS — E1122 Type 2 diabetes mellitus with diabetic chronic kidney disease: Secondary | ICD-10-CM

## 2019-03-22 DIAGNOSIS — N185 Chronic kidney disease, stage 5: Secondary | ICD-10-CM

## 2019-03-22 DIAGNOSIS — I10 Essential (primary) hypertension: Secondary | ICD-10-CM

## 2019-03-22 NOTE — Progress Notes (Signed)
SUBJECTIVE:  Patient ID: [name], male    DOB: 1946/01/03, 73 y.o.   MRN: [id number] Chief Complaint  Patient presents with  . Follow-up    [facility] 3 wk f/u no studies    HPI  [name] is a 73 y.o. male that presents today for follow-up after a peritoneal dialysis catheter placement on 03/01/2019.  Today the patient's insertion sites are very nearly healed and the insertion site of the catheter looks good.  Patient denies any drainage coming from the site.  He denies any fever, chills, nausea, vomiting or diarrhea.  He denies any chest pain or shortness of breath.  Overall he states that he feels pretty well after having his catheter placement done.  Past Medical History:  Diagnosis Date  . 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 Complication of anesthesia    had to be woken up slowly as his bp was elevated when did this quickly  . Hyperlipidemia   . Hypertension   . Migraine    cluster  . Neuromuscular disorder ([place])    left lower extrem neuropathy  . Obstructive sleep apnea    no OSA since had facial surgery with dr. Kathyrn Sheriff in 1997  . PAD (peripheral artery disease) [facility]) Feb 2009   nonobstructing, renal angiogram ([place])  . Renal cell carcinoma 2004   left kidney heminephrectomy  . TIA (transient ischemic attack)    no residual but left leg and foot still feel heavy  . tobacco abuse     Past Surgical History:  Procedure Laterality Date  . CAPD INSERTION N/A 03/01/2019   Procedure: LAPAROSCOPIC INSERTION CONTINUOUS AMBULATORY PERITONEAL DIALYSIS  (CAPD) CATHETER;  Surgeon: Algernon Huxley, MD;  Location: [facility] ORS;  Service: Vascular;  Laterality: N/A;  . CARDIAC CATHETERIZATION     Dr. [name] did this to assess his renal artery  . cyst removal  12/25/2015   Spine L4 and L5  . heminephrectomy  2004   for renal cell CA  . RENAL CRYOABLATION  Jan 2006   right kidney,  [name]  . sciatica      Social History    Socioeconomic History  . Marital status: Married    Spouse name: Bailey Mech  . Number of children: Not on file  . Years of education: Not on file  . Highest education level: Not on file  Occupational History  . 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 Financial resource strain: Not hard at all  . Food insecurity    Worry: Never true    Inability: Never true  . Transportation needs    Medical: No    Non-medical: No  Tobacco Use  . Smoking status: Current Every Day Smoker    Packs/day: 1.00    Years: 50.00    Pack years: 50.00    Types: Cigarettes  . Smokeless tobacco: Never Used  Substance and Sexual Activity  . Alcohol use: Not Currently  . Drug use: No  . Sexual activity: Yes  Lifestyle  . Physical activity    Days per week: 3 days    Minutes per session: 20 min  . Stress: Only a little  Relationships  . Social Herbalist on phone: Not on file    Gets together: Not on file    Attends religious service: Not on file    Active member of club or organization: Not on file  Attends meetings of clubs or organizations: Not on file    Relationship status: Married  . Intimate partner violence    Fear of current or ex partner: No    Emotionally abused: No    Physically abused: No    Forced sexual activity: No  Other Topics Concern  . Not on file  Social History Narrative  . Not on file    Family History  Problem Relation Age of Onset  . Hypertension Mother   . Cancer Mother        breast  . Aneurysm Mother   . Coronary artery disease Father   . Hypertension Father   . Stroke Father 22  . Heart disease Father   . Heart attack Father 52  . Aneurysm Maternal Grandmother        brain  . Aneurysm Paternal Grandmother        brain  . Coronary artery disease Paternal Grandfather   . Heart disease Brother        valvular heart disease  . COPD Brother   . Hypertension Brother   . Stroke Paternal Uncle     Allergies  Allergen  Reactions  . Irbesartan Other (See Comments)    hyperkalemia  . Atorvastatin Other (See Comments)    Muscle pain  . Bystolic [Nebivolol Hcl]     Extreme fatigue      Review of Systems   Review of Systems: Negative Unless Checked Constitutional: [] Weight loss  [] Fever  [] Chills Cardiac: [] Chest pain   []  Atrial Fibrillation  [] Palpitations   [] Shortness of breath when laying flat   [x] Shortness of breath with exertion. [] Shortness of breath at rest Vascular:  [] Pain in legs with walking   [] Pain in legs with standing [] Pain in legs when laying flat   [] Claudication    [] Pain in feet when laying flat    [] History of DVT   [] Phlebitis   [] Swelling in legs   [] Varicose veins   [] Non-healing ulcers Pulmonary:   [] Uses home oxygen   [] Productive cough   [] Hemoptysis   [] Wheeze  [x] COPD   [] Asthma Neurologic:  [] Dizziness   [] Seizures  [] Blackouts [] History of stroke   [x] History of TIA  [] Aphasia   [] Temporary Blindness   [] Weakness or numbness in arm   [] Weakness or numbness in leg Musculoskeletal:   [] Joint swelling   [] Joint pain   [] Low back pain  []  History of Knee Replacement [] Arthritis [] back Surgeries  []  Spinal Stenosis    Hematologic:  [] Easy bruising  [] Easy bleeding   [] Hypercoagulable state   [x] Anemic Gastrointestinal:  [] Diarrhea   [] Vomiting  [] Gastroesophageal reflux/heartburn   [] Difficulty swallowing. [] Abdominal pain Genitourinary:  [x] Chronic kidney disease   [] Difficult urination  [] Anuric   [] Blood in urine [] Frequent urination  [] Burning with urination   [] Hematuria Skin:  [] Rashes   [] Ulcers [] Wounds Psychological:  [] History of anxiety   []  History of major depression  []  Memory Difficulties      OBJECTIVE:   Physical Exam  BP (!) 226/96 (BP Location: Right Arm)   Pulse 73   Resp 17   Ht 5\' 6"  (1.676 m)   Wt 142 lb (64.4 kg)   BMI 22.92 kg/m   Gen: WD/WN, NAD Head: [place]/AT, No temporalis wasting.  Ear/Nose/Throat: Hearing grossly intact, nares w/o erythema  or drainage Eyes: PER, EOMI, sclera nonicteric.  Neck: Supple, no masses.  No JVD.  Pulmonary:  Good air movement, no use of accessory muscles.  Cardiac: RRR Vascular:  Insertion site looks good.  Puncture sites look good as well. Vessel Right Left  Radial Palpable Palpable   Gastrointestinal: soft, non-distended. No guarding/no peritoneal signs.  Musculoskeletal: M/S 5/5 throughout.  No deformity or atrophy.  Neurologic: Pain and light touch intact in extremities.  Symmetrical.  Speech is fluent. Motor exam as listed above. Psychiatric: Judgment intact, Mood & affect appropriate for pt's clinical situation. Dermatologic: No Venous rashes. No Ulcers Noted.  No changes consistent with cellulitis. Lymph : No Cervical lymphadenopathy, no lichenification or skin changes of chronic lymphedema.       ASSESSMENT AND PLAN:  1. Type 2 diabetes mellitus with stage 5 chronic kidney disease not on chronic dialysis, without long-term current use of insulin (HCC) Continue hypoglycemic medications as already ordered, these medications have been reviewed and there are no changes at this time.  Hgb A1C to be monitored as already arranged by primary service   2. Essential hypertension Patient has been working with elevated blood pressure and changing with his nephrologist.  Patient states that this is higher than normal.  He denies any strokelike symptoms, headache or vision alterations.  I have advised the patient to recheck his blood pressure once he returns home if it continues to be elevated patient should contact either his PCP or seek attention at the urgent care.  3. CKD (chronic kidney disease), stage V ([place]) Patient will follow-up with [name] on an as-needed basis as peritoneal catheters typically do not require routine maintenance of follow-up.    Current Outpatient Medications on File Prior to Visit  Medication Sig Dispense Refill  . amLODipine (NORVASC) 10 MG tablet Take 1 tablet (10 mg  total) by mouth daily. 90 tablet 1  . aspirin 81 MG tablet Take 1 tablet (81 mg total) by mouth daily. 30 tablet 11  . calcitRIOL (ROCALTROL) 0.25 MCG capsule Take 0.25 mcg by mouth daily. Take one capsule only on Monday / Wednesday / FRIDAY  3  . carvedilol (COREG) 6.25 MG tablet TK 1 T PO BID  6  . cloNIDine (CATAPRES) 0.2 MG tablet Take 1 tablet (0.2 mg total) by mouth 3 (three) times daily. 270 tablet 3  . diphenhydrAMINE (BENADRYL) 25 MG tablet Take 25 mg by mouth every 6 (six) hours as needed.    . ferrous sulfate 325 (65 FE) MG tablet Take 1 tablet (325 mg total) by mouth daily. With orange juice 90 tablet 3  . fluticasone (FLONASE) 50 MCG/ACT nasal spray Place 2 sprays into both nostrils daily. 16 g 6  . gabapentin (NEURONTIN) 100 MG capsule Take 1 capsule (100 mg total) by mouth 3 (three) times daily. (Patient taking differently: Take 100 mg by mouth 3 (three) times daily as needed. Usually takes only prior to bedtime to help him sleep) 90 capsule 1  . sodium bicarbonate 325 MG tablet TK 1 T PO BID  1  . albuterol (PROVENTIL HFA;VENTOLIN HFA) 108 (90 Base) MCG/ACT inhaler Inhale 2 puffs into the lungs every 6 (six) hours as needed for wheezing or shortness of breath. (Patient not taking: Reported on 02/26/2019) 1 Inhaler 0  . HYDROcodone-acetaminophen (NORCO) 5-325 MG tablet Take 1 tablet by mouth every 6 (six) hours as needed for moderate pain. (Patient not taking: Reported on 03/22/2019) 30 tablet 0  . sildenafil (REVATIO) 20 MG tablet 2-5 tablets as needed 1 hour prior to intercourse     Current Facility-Administered Medications on File Prior to Visit  Medication Dose Route Frequency Provider Last Rate Last Dose  .  ipratropium-albuterol (DUONEB) 0.5-2.5 (3) MG/3ML nebulizer solution 3 mL  3 mL Nebulization Q6H Crecencio Mc, MD        There are no Patient Instructions on file for this visit. No follow-ups on file.   Kris Hartmann, NP  This note was completed with Production manager.  Any errors are purely unintentional.

## 2019-03-27 ENCOUNTER — Telehealth: Payer: Self-pay | Admitting: Internal Medicine

## 2019-03-27 ENCOUNTER — Other Ambulatory Visit: Payer: Self-pay | Admitting: Internal Medicine

## 2019-03-27 DIAGNOSIS — G629 Polyneuropathy, unspecified: Secondary | ICD-10-CM

## 2019-03-27 NOTE — Telephone Encounter (Signed)
I see that my virnig saw his kidney doctor and was referred to neurology and he saw Dr Manuella Ghazi.   Dr Manuella Ghazi wanted a bunch of new  labs  Not sure if he has had thme done yet or where. I have ordered thme if he needs or wants to get them done at our office.

## 2019-03-29 NOTE — Telephone Encounter (Signed)
LM on both home & cell number for patient to call back to inquire if he had labs done with Dr. Manuella Ghazi or if he needed to come here to have done.

## 2019-03-29 NOTE — Telephone Encounter (Signed)
lmov to schedule  °

## 2019-04-04 ENCOUNTER — Other Ambulatory Visit: Payer: Self-pay | Admitting: Nephrology

## 2019-04-04 ENCOUNTER — Ambulatory Visit
Admission: RE | Admit: 2019-04-04 | Discharge: 2019-04-04 | Disposition: A | Discharge status: Home / Self Care | Payer: Medicare Other | Source: Ambulatory Visit | Attending: Nephrology | Admitting: Nephrology

## 2019-04-04 ENCOUNTER — Other Ambulatory Visit: Payer: Self-pay

## 2019-04-04 DIAGNOSIS — N186 End stage renal disease: Secondary | ICD-10-CM | POA: Diagnosis not present

## 2019-04-04 DIAGNOSIS — R918 Other nonspecific abnormal finding of lung field: Secondary | ICD-10-CM | POA: Diagnosis not present

## 2019-04-04 DIAGNOSIS — J439 Emphysema, unspecified: Secondary | ICD-10-CM

## 2019-04-05 DIAGNOSIS — D631 Anemia in chronic kidney disease: Secondary | ICD-10-CM | POA: Diagnosis not present

## 2019-04-05 DIAGNOSIS — Z992 Dependence on renal dialysis: Secondary | ICD-10-CM | POA: Diagnosis not present

## 2019-04-05 DIAGNOSIS — N186 End stage renal disease: Secondary | ICD-10-CM | POA: Diagnosis not present

## 2019-04-05 DIAGNOSIS — N2581 Secondary hyperparathyroidism of renal origin: Secondary | ICD-10-CM | POA: Diagnosis not present

## 2019-04-06 DIAGNOSIS — N2581 Secondary hyperparathyroidism of renal origin: Secondary | ICD-10-CM | POA: Diagnosis not present

## 2019-04-06 DIAGNOSIS — Z992 Dependence on renal dialysis: Secondary | ICD-10-CM | POA: Diagnosis not present

## 2019-04-06 DIAGNOSIS — N186 End stage renal disease: Secondary | ICD-10-CM | POA: Diagnosis not present

## 2019-04-06 DIAGNOSIS — D631 Anemia in chronic kidney disease: Secondary | ICD-10-CM | POA: Diagnosis not present

## 2019-04-06 DIAGNOSIS — I259 Chronic ischemic heart disease, unspecified: Secondary | ICD-10-CM | POA: Diagnosis not present

## 2019-04-06 NOTE — Telephone Encounter (Signed)
Labs completed 03/27/19

## 2019-04-09 DIAGNOSIS — N186 End stage renal disease: Secondary | ICD-10-CM | POA: Diagnosis not present

## 2019-04-09 DIAGNOSIS — Z992 Dependence on renal dialysis: Secondary | ICD-10-CM | POA: Diagnosis not present

## 2019-04-09 DIAGNOSIS — D631 Anemia in chronic kidney disease: Secondary | ICD-10-CM | POA: Diagnosis not present

## 2019-04-09 DIAGNOSIS — N2581 Secondary hyperparathyroidism of renal origin: Secondary | ICD-10-CM | POA: Diagnosis not present

## 2019-04-10 ENCOUNTER — Other Ambulatory Visit: Payer: Self-pay

## 2019-04-11 DIAGNOSIS — N2581 Secondary hyperparathyroidism of renal origin: Secondary | ICD-10-CM | POA: Diagnosis not present

## 2019-04-11 DIAGNOSIS — N186 End stage renal disease: Secondary | ICD-10-CM | POA: Diagnosis not present

## 2019-04-11 DIAGNOSIS — D631 Anemia in chronic kidney disease: Secondary | ICD-10-CM | POA: Diagnosis not present

## 2019-04-11 DIAGNOSIS — Z992 Dependence on renal dialysis: Secondary | ICD-10-CM | POA: Diagnosis not present

## 2019-04-12 ENCOUNTER — Other Ambulatory Visit: Payer: Self-pay

## 2019-04-12 ENCOUNTER — Encounter: Payer: Self-pay | Admitting: Internal Medicine

## 2019-04-12 ENCOUNTER — Ambulatory Visit (INDEPENDENT_AMBULATORY_CARE_PROVIDER_SITE_OTHER): Payer: Medicare Other | Admitting: Internal Medicine

## 2019-04-12 ENCOUNTER — Telehealth: Payer: Self-pay

## 2019-04-12 ENCOUNTER — Ambulatory Visit: Payer: Medicare Other

## 2019-04-12 DIAGNOSIS — Z992 Dependence on renal dialysis: Secondary | ICD-10-CM | POA: Diagnosis not present

## 2019-04-12 DIAGNOSIS — M5442 Lumbago with sciatica, left side: Secondary | ICD-10-CM

## 2019-04-12 DIAGNOSIS — F331 Major depressive disorder, recurrent, moderate: Secondary | ICD-10-CM

## 2019-04-12 DIAGNOSIS — N185 Chronic kidney disease, stage 5: Secondary | ICD-10-CM | POA: Diagnosis not present

## 2019-04-12 DIAGNOSIS — G8929 Other chronic pain: Secondary | ICD-10-CM

## 2019-04-12 DIAGNOSIS — M5441 Lumbago with sciatica, right side: Secondary | ICD-10-CM | POA: Diagnosis not present

## 2019-04-12 DIAGNOSIS — I120 Hypertensive chronic kidney disease with stage 5 chronic kidney disease or end stage renal disease: Secondary | ICD-10-CM | POA: Diagnosis not present

## 2019-04-12 DIAGNOSIS — E1122 Type 2 diabetes mellitus with diabetic chronic kidney disease: Secondary | ICD-10-CM | POA: Diagnosis not present

## 2019-04-12 DIAGNOSIS — N186 End stage renal disease: Secondary | ICD-10-CM | POA: Diagnosis not present

## 2019-04-12 DIAGNOSIS — D631 Anemia in chronic kidney disease: Secondary | ICD-10-CM | POA: Diagnosis not present

## 2019-04-12 DIAGNOSIS — N2581 Secondary hyperparathyroidism of renal origin: Secondary | ICD-10-CM | POA: Diagnosis not present

## 2019-04-12 MED ORDER — TRAMADOL HCL 50 MG PO TABS: 50.0000 mg | ORAL_TABLET | Freq: Two times a day (BID) | ORAL | 5 refills | Status: DC

## 2019-04-12 MED ORDER — ALBUTEROL SULFATE HFA 108 (90 BASE) MCG/ACT IN AERS: 2.0000 | INHALATION_SPRAY | Freq: Four times a day (QID) | RESPIRATORY_TRACT | 0 refills | Status: DC | PRN

## 2019-04-12 NOTE — Progress Notes (Signed)
Subjective:  Patient ID: [name], male    DOB: [birth year]  Age: 73 y.o. MRN: [id number]  CC: Diagnoses of Chronic pain not due to malignancy, Renal sclerosis with hypertension, stage 5 chronic kidney disease or end stage renal disease ([name]), Type 2 diabetes mellitus with stage 5 chronic kidney disease not on chronic dialysis, without long-term current use of insulin (HCC), Moderate episode of recurrent major depressive disorder ([name]), and Chronic bilateral low back pain with bilateral sciatica were pertinent to this visit.  HPI [name] presents for follow up on hypertension  ,  Type 2 DM with nephropathy, neuropathy   And ESKD  and chronic low back pain    eskd : planning to start peritoneal dialysis  Cath placed oct 20. Has not started yet.  Appetite not very good .  Eats twice daily,  Likes to graze   [name] has changed BP meds,  Stopped losartan, but resumed it yesterday .  Still on amlodipine and carvedilol   Bilateral hip and leg pain constant  >RI Lumbar spine non revealing For neuropathy,  Taking gabapentin max dose 100 mg due ot GFR.  [name] started cymbalta but  Did not tolerate one dose. Tolerated tramadol better,  refilll given Back extension demonstrate  Lab Results  Component Value Date   HGBA1C 5.7 (A) 11/30/2018   HGBA1C 5.7 11/30/2018      AWV done today  Received 30 Vicodin 5/325 on Oct 8 from [name]   Outpatient Medications Prior to Visit  Medication Sig Dispense Refill  . amLODipine (NORVASC) 10 MG tablet Take 1 tablet (10 mg total) by mouth daily. 90 tablet 1  . aspirin 81 MG tablet Take 1 tablet (81 mg total) by mouth daily. 30 tablet 11  . calcitRIOL (ROCALTROL) 0.25 MCG capsule Take 0.25 mcg by mouth daily. Take one capsule only on Monday / Wednesday / FRIDAY  3  . carvedilol (COREG) 6.25 MG tablet TK 1 T PO BID  6  . cephALEXin (KEFLEX) 500 MG capsule     . cloNIDine (CATAPRES) 0.2 MG tablet Take 1 tablet (0.2 mg total) by mouth 3 (three)  times daily. 270 tablet 3  . diphenhydrAMINE (BENADRYL) 25 MG tablet Take 25 mg by mouth every 6 (six) hours as needed.    . ferrous sulfate 325 (65 FE) MG tablet Take 1 tablet (325 mg total) by mouth daily. With orange juice 90 tablet 3  . fluticasone (FLONASE) 50 MCG/ACT nasal spray Place 2 sprays into both nostrils daily. 16 g 6  . gabapentin (NEURONTIN) 100 MG capsule Take 1 capsule (100 mg total) by mouth 3 (three) times daily. (Patient taking differently: Take 100 mg by mouth 3 (three) times daily as needed. Usually takes only prior to bedtime to help him sleep) 90 capsule 1  . losartan (COZAAR) 100 MG tablet Take 100 mg by mouth daily.    . sodium bicarbonate 325 MG tablet TK 1 T PO BID  1  . albuterol (PROVENTIL HFA;VENTOLIN HFA) 108 (90 Base) MCG/ACT inhaler Inhale 2 puffs into the lungs every 6 (six) hours as needed for wheezing or shortness of breath. 1 Inhaler 0  . DULoxetine (CYMBALTA) 20 MG capsule Take 20 mg by mouth daily.    Marland Kitchen HYDROcodone-acetaminophen (NORCO) 5-325 MG tablet Take 1 tablet by mouth every 6 (six) hours as needed for moderate pain. (Patient not taking: Reported on 04/12/2019) 30 tablet 0  . sildenafil (REVATIO) 20 MG tablet 2-5 tablets as  needed 1 hour prior to intercourse     Facility-Administered Medications Prior to Visit  Medication Dose Route Frequency Provider Last Rate Last Dose  . ipratropium-albuterol (DUONEB) 0.5-2.5 (3) MG/3ML nebulizer solution 3 mL  3 mL Nebulization Q6H Brett Mc, MD        Review of Systems;  Patient denies headache, fevers, malaise, unintentional weight loss, skin rash, eye pain, sinus congestion and sinus pain, sore throat, dysphagia,  hemoptysis , cough, dyspnea, wheezing, chest pain, palpitations, orthopnea, edema, abdominal pain, nausea, melena, diarrhea, constipation, flank pain, dysuria, hematuria, urinary  Frequency, nocturia, numbness, tingling, seizures,  Focal weakness, Loss of consciousness,  Tremor, insomnia,  depression, anxiety, and suicidal ideation.      Objective:  BP (!) 150/70 (BP Location: Left Arm, Patient Position: Sitting, Cuff Size: Normal)   Torres 63   Temp (!) 96.5 F (35.8 C) (Temporal)   Resp 16   Ht 5\' 6"  (1.676 m)   Wt 146 lb 9.6 oz (66.5 kg)   SpO2 99%   BMI 23.66 kg/m   BP Readings from Last 3 Encounters:  04/12/19 (!) 150/70  03/22/19 (!) 226/96  03/01/19 (!) (P) 195/73    Wt Readings from Last 3 Encounters:  04/12/19 146 lb 9.6 oz (66.5 kg)  03/22/19 142 lb (64.4 kg)  03/01/19 143 lb 4.8 oz (65 kg)    General appearance: alert, cooperative and appears stated age Ears: normal TM's and external ear canals both ears Throat: lips, mucosa, and tongue normal; teeth and gums normal Neck: no adenopathy, no carotid bruit, supple, symmetrical, trachea midline and thyroid not enlarged, symmetric, no tenderness/mass/nodules Back: symmetric, no curvature. ROM normal. No CVA tenderness. Lungs: clear to auscultation bilaterally Heart: regular rate and rhythm, S1, S2 normal, no murmur, click, rub or gallop Abdomen: soft, non-tender; bowel sounds normal; no masses,  no organomegaly Pulses: 2+ and symmetric Skin: Skin color, texture, turgor normal. No rashes or lesions Lymph nodes: Cervical, supraclavicular, and axillary nodes normal.  Lab Results  Component Value Date   HGBA1C 5.7 (A) 11/30/2018   HGBA1C 5.7 11/30/2018   HGBA1C 5.9 05/29/2018    Lab Results  Component Value Date   CREATININE 7.15 (H) 02/26/2019   CREATININE 4.96 (HH) 05/29/2018   CREATININE 4.6 04/19/2018    Lab Results  Component Value Date   WBC 11.1 (H) 02/26/2019   HGB 8.6 (L) 02/26/2019   HCT 25.0 (L) 02/26/2019   PLT 203 02/26/2019   GLUCOSE 98 02/26/2019   CHOL 160 05/29/2018   TRIG 177.0 (H) 05/29/2018   HDL 24.40 (L) 05/29/2018   LDLDIRECT 134.0 05/11/2017   LDLCALC 101 (H) 05/29/2018   ALT 7 05/29/2018   AST 9 05/29/2018   NA 134 (L) 02/26/2019   K 5.0 02/26/2019   CL 104  02/26/2019   CREATININE 7.15 (H) 02/26/2019   BUN 67 (H) 02/26/2019   CO2 19 (L) 02/26/2019   TSH 3.95 11/08/2013   PSA 4.34 (H) 07/26/2013   INR 1.1 02/26/2019   HGBA1C 5.7 (A) 11/30/2018   HGBA1C 5.7 11/30/2018   MICROALBUR 78.6 (H) 05/11/2017    Dg Chest 2 View  Result Date: 04/04/2019 CLINICAL DATA:  End-stage renal disease EXAM: CHEST - 2 VIEW COMPARISON:  07/25/2017 FINDINGS: Enlargement of cardiac silhouette. Mediastinal contours and pulmonary vascularity normal. Lungs hyperinflated but clear. No pulmonary infiltrate, pleural effusion or pneumothorax. No acute osseous findings. IMPRESSION: Enlargement of cardiac silhouette. Hyperinflation without infiltrate Electronically Signed   By: Lavonia Dana  M.D.   On: 04/04/2019 12:57    Assessment & Plan:   Problem List Items Addressed This Visit      Unprioritized   Hypertensive renal sclerosis with hypertension    Historically uncontrolled despite maximal therapy.  No change today       Relevant Medications   losartan (COZAAR) 100 MG tablet   DM (diabetes mellitus), type 2 with renal complications (HCC)    Controlled without medications secondary to weight loss  Lab Results  Component Value Date   HGBA1C 5.7 (A) 11/30/2018   HGBA1C 5.7 11/30/2018         Relevant Medications   losartan (COZAAR) 100 MG tablet   Major depressive disorder with current active episode    Aggravated by chronic back  Pain and malaise secondary to ESKD  .  Refilling tramadol      Chronic bilateral low back pain with bilateral sciatica    Persistent for the past 1.5 years,  Due to recurrent synovial cysts and DJD.  Dose of gabapentin  Has been maximized for ow GFR.  t tramadol for management of pain..       Relevant Medications   traMADol (ULTRAM) 50 MG tablet   Chronic pain not due to malignancy    Has not tried taking the hydrocodone prescribed post op by [facility] because they were too sedating .  Has at least 10 left.  Encouraged to try using  them not more than once daily and use tramadol as needed.       Relevant Medications   traMADol (ULTRAM) 50 MG tablet      I have discontinued [name] "[facility]man L. Belasco"'s DULoxetine. I have also changed his traMADol and albuterol. Additionally, I am having him maintain his aspirin, diphenhydrAMINE, sildenafil, carvedilol, ferrous sulfate, sodium bicarbonate, calcitRIOL, fluticasone, gabapentin, cloNIDine, amLODipine, HYDROcodone-acetaminophen, losartan, and cephALEXin. We will continue to administer ipratropium-albuterol.  Meds ordered this encounter  Medications  . traMADol (ULTRAM) 50 MG tablet    Sig: Take 1 tablet (50 mg total) by mouth 2 (two) times daily.    Dispense:  60 tablet    Refill:  5  . albuterol (VENTOLIN HFA) 108 (90 Base) MCG/ACT inhaler    Sig: Inhale 2 puffs into the lungs every 6 (six) hours as needed for wheezing or shortness of breath.    Dispense:  18 g    Refill:  0    Medications Discontinued During This Encounter  Medication Reason  . DULoxetine (CYMBALTA) 20 MG capsule   . albuterol (PROVENTIL HFA;VENTOLIN HFA) 108 (90 Base) MCG/ACT inhaler Reorder   A total of 25 minutes of face to face time was spent with patient more than half of which was spent in counselling about the above mentioned conditions  and coordination of care    Follow-up: Return in about 4 weeks (around 05/10/2019).   Brett Mc, MD

## 2019-04-12 NOTE — Telephone Encounter (Signed)
Unable to reach patient for annual wellness visit. Called x2, no answer. Left message to return my call. Patient has appointment with his pcp today.

## 2019-04-12 NOTE — Assessment & Plan Note (Signed)
Has not tried taking the hydrocodone prescribed post op by [facility] because they were too sedating .  Has at least 10 left.  Encouraged to try using them not more than once daily and use tramadol as needed.

## 2019-04-12 NOTE — Patient Instructions (Addendum)
For your back pain:  Practice the "Back Extension" exercise against  A counter  . Your buttocks should rest against the counter and your hands should be supporting /resting on the counter behind you.  10 repetitions   Is one set  You can do 5 sets a day,  This exercise REVERSEs the pressure on the spine caused by sitting.    You need to use  A lumbar support pillow   You can use the tramadol every 12 hours  For your pain

## 2019-04-13 DIAGNOSIS — Z992 Dependence on renal dialysis: Secondary | ICD-10-CM | POA: Diagnosis not present

## 2019-04-13 DIAGNOSIS — D631 Anemia in chronic kidney disease: Secondary | ICD-10-CM | POA: Diagnosis not present

## 2019-04-13 DIAGNOSIS — N2581 Secondary hyperparathyroidism of renal origin: Secondary | ICD-10-CM | POA: Diagnosis not present

## 2019-04-13 DIAGNOSIS — N186 End stage renal disease: Secondary | ICD-10-CM | POA: Diagnosis not present

## 2019-04-14 NOTE — Assessment & Plan Note (Signed)
Controlled without medications secondary to weight loss  Lab Results  Component Value Date   HGBA1C 5.7 (A) 11/30/2018   HGBA1C 5.7 11/30/2018

## 2019-04-14 NOTE — Assessment & Plan Note (Signed)
Historically uncontrolled despite maximal therapy.  No change today

## 2019-04-14 NOTE — Assessment & Plan Note (Addendum)
Aggravated by chronic back  Pain and malaise secondary to ESKD  .  Refilling tramadol

## 2019-04-14 NOTE — Assessment & Plan Note (Signed)
Persistent for the past 1.5 years,  Due to recurrent synovial cysts and DJD.  Dose of gabapentin  Has been maximized for ow GFR.  t tramadol for management of pain.Marland Kitchen

## 2019-04-16 DIAGNOSIS — Z992 Dependence on renal dialysis: Secondary | ICD-10-CM | POA: Diagnosis not present

## 2019-04-16 DIAGNOSIS — D631 Anemia in chronic kidney disease: Secondary | ICD-10-CM | POA: Diagnosis not present

## 2019-04-16 DIAGNOSIS — N186 End stage renal disease: Secondary | ICD-10-CM | POA: Diagnosis not present

## 2019-04-16 DIAGNOSIS — N2581 Secondary hyperparathyroidism of renal origin: Secondary | ICD-10-CM | POA: Diagnosis not present

## 2019-04-23 DIAGNOSIS — D631 Anemia in chronic kidney disease: Secondary | ICD-10-CM | POA: Diagnosis not present

## 2019-04-23 DIAGNOSIS — Z992 Dependence on renal dialysis: Secondary | ICD-10-CM | POA: Diagnosis not present

## 2019-04-23 DIAGNOSIS — N2581 Secondary hyperparathyroidism of renal origin: Secondary | ICD-10-CM | POA: Diagnosis not present

## 2019-04-23 DIAGNOSIS — N186 End stage renal disease: Secondary | ICD-10-CM | POA: Diagnosis not present

## 2019-04-24 ENCOUNTER — Telehealth: Payer: Self-pay | Admitting: Internal Medicine

## 2019-04-24 NOTE — Telephone Encounter (Signed)
-----   Message from [name], MD sent at 04/24/2019  7:03 AM EST ----- Regarding: Visit for management of hypertension Good morning,  Dr. Juleen China reached out to me about Mr. Bogle regarding difficult to control BP.  Would it be possible to schedule Mr. Grasse to see me or an APP sometime this week to reassess his BP and discuss further medication changes?  Thanks.  Gerald Stabs

## 2019-04-24 NOTE — Telephone Encounter (Signed)
lmov to schedule.  12/2 at 1120 .

## 2019-04-25 ENCOUNTER — Encounter: Payer: Self-pay | Admitting: Internal Medicine

## 2019-04-25 ENCOUNTER — Other Ambulatory Visit: Payer: Self-pay

## 2019-04-25 ENCOUNTER — Ambulatory Visit (INDEPENDENT_AMBULATORY_CARE_PROVIDER_SITE_OTHER): Payer: Medicare Other | Admitting: Internal Medicine

## 2019-04-25 VITALS — BP 150/70 | HR 57 | Ht 66.0 in | Wt 147.2 lb

## 2019-04-25 DIAGNOSIS — I10 Essential (primary) hypertension: Secondary | ICD-10-CM

## 2019-04-25 DIAGNOSIS — Z992 Dependence on renal dialysis: Secondary | ICD-10-CM | POA: Diagnosis not present

## 2019-04-25 DIAGNOSIS — I2510 Atherosclerotic heart disease of native coronary artery without angina pectoris: Secondary | ICD-10-CM

## 2019-04-25 DIAGNOSIS — I429 Cardiomyopathy, unspecified: Secondary | ICD-10-CM

## 2019-04-25 DIAGNOSIS — N186 End stage renal disease: Secondary | ICD-10-CM

## 2019-04-25 DIAGNOSIS — D509 Iron deficiency anemia, unspecified: Secondary | ICD-10-CM | POA: Diagnosis not present

## 2019-04-25 DIAGNOSIS — D631 Anemia in chronic kidney disease: Secondary | ICD-10-CM | POA: Diagnosis not present

## 2019-04-25 NOTE — Patient Instructions (Signed)
Dr End recommends you check your blood pressure two times a day.  Medication Instructions:  Your physician recommends that you continue on your current medications as directed. Please refer to the Current Medication list given to you today.  *If you need a refill on your cardiac medications before your next appointment, please call your pharmacy*  Lab Work: none If you have labs (blood work) drawn today and your tests are completely normal, you will receive your results only by: Marland Kitchen MyChart Message (if you have MyChart) OR . A paper copy in the mail If you have any lab test that is abnormal or we need to change your treatment, we will call you to review the results.  Testing/Procedures: none  Follow-Up: At [facility], you and your health needs are our priority.  As part of our continuing mission to provide you with exceptional heart care, we have created designated Provider Care Teams.  These Care Teams include your primary Cardiologist (physician) and Advanced Practice Providers (APPs -  Physician Assistants and Nurse Practitioners) who all work together to provide you with the care you need, when you need it.  Your next appointment:   2 week(s)  The format for your next appointment:   Virtual Visit   Provider:    You may see DR Harrell Gave END or one of the following Advanced Practice Providers on your designated Care Team:    Gouveia Hodgkins, NP  Christell Faith, PA-C  Marrianne Mood, PA-C

## 2019-04-25 NOTE — Progress Notes (Signed)
Follow-up Outpatient Visit Date: 04/25/2019  Primary Care Provider: Crecencio Mc, MD [place] [address]  Chief Complaint: Elevated blood pressure  HPI:  [name] is a 73 y.o. male with history of [name]-stage renal disease with recent initiation of peritoneal dialysis, cardiomyopathy with moderately reduced LVEF, hypertension, hyperlipidemia, obstructive sleep apnea, peripheral vascular disease, renal cell carcinoma status post left nephrectomy, and adenocarcinoma of the appendix, who presents for evaluation management of hypertension at the request of Dr. Juleen China.  I met [name] in August for preoperative risk assessment in anticipation of peritoneal dialysis catheter placement.  He reported chronic exertional dyspnea but otherwise was asymptomatic.  Subsequent echo revealed moderately induced left ventricular systolic function and mild dilation of the ascending aorta.  Mild aortic and mild to moderate mitral regurgitation were also noted.  Subsequent stress test showed small, fixed basal and mid anterolateral defect most likely representing scar and less likely artifact.  Small, mild, minimally reversible mid and apical inferior defect representing scar and possible peri-infarct ischemia was also noted.  Coronary artery and aortic atherosclerosis was also noted.  Given lack of unstable symptoms, [name] was advised to proceed with dialysis catheter placement.  Recently, [name] has continued to struggle with blood pressure control.  Today, [name] reports that he is feeling well.  Chronic exertional dyspnea is unchanged from baseline.  He recently began training for his peritoneal dialysis and was noted to have blood pressure readings up to 222/100.  He reports a single episode of sudden right shoulder pain that shot across his right upper chest.  The right shoulder area subsequently remained sore for a few hours.  This has not recurred.  He was bending  over when the pain occurred; he has not had any exertional chest pain.  [name] began taking minoxidil 2.5 mg daily yesterday under the direction of Dr. Juleen China.  His blood pressure has been modestly lower the last few days, though systolic readings are typically still in the 140-160 mmHg range.  Blood pressure seems to have improved as well when dialysis solution was altered.  He reports feeling transient orthostatic lightheadedness since starting minoxidil.  He denies palpitations, orthopnea, PND, and edema.  He is planning to begin home PD tonight. --------------------------------------------------------------------------------------------------  Cardiovascular History & Procedures: Cardiovascular Problems:  Cardiomyopathy  Renal artery stenosis (nonobstructive)  Risk Factors:  Hypertension, hyperlipidemia, male gender, PAD, and age > 41  Cath/PCI:  None available  CV Surgery:  None  EP Procedures and Devices:  None  Non-Invasive Evaluation(s):  Pharmacologic MPI (02/06/2019): Abnormal, intermediate risk stress test.  Small in size, mild in severity, fixed basal and mid anterolateral defect most likely representing scar and less likely artifact.  There is also a small in size, mild severity, minimally reversible mid and apical inferior defect most likely representing scar with possible element of peri-infarct ischemia and/or artifact.  LVEF early reduced.  Coronary artery and aortic atherosclerosis noted.  TTE (01/17/2019): Normal LV size with mild to moderate LVH.  LVEF 40-45% with global hypokinesis.  Normal RV size and function.  Mild to moderate left atrial enlargement.  Mild aortic and mild to moderate mitral regurgitation.  Mild dilation of the ascending aorta, measuring 3.7 cm.  Recent CV Pertinent Labs: Lab Results  Component Value Date   CHOL 160 05/29/2018   CHOL 159 01/04/2012   HDL 24.40 (L) 05/29/2018   HDL 17 (L) 01/04/2012   LDLCALC 101 (H) 05/29/2018  LDLCALC 98 01/04/2012   LDLDIRECT 134.0 05/11/2017   TRIG 177.0 (H) 05/29/2018   TRIG 218 (H) 01/04/2012   CHOLHDL 7 05/29/2018   INR 1.1 02/26/2019   INR 0.9 01/03/2012   K 5.0 02/26/2019   K 4.3 07/04/2012   MG 2.6 (H) 11/08/2013   BUN 67 (H) 02/26/2019   BUN 44 (A) 04/19/2018   BUN 15 07/04/2012   CREATININE 7.15 (H) 02/26/2019   CREATININE 1.92 (H) 07/18/2015    Past medical and surgical history were reviewed and updated in EPIC.  Current Meds  Medication Sig  . albuterol (VENTOLIN HFA) 108 (90 Base) MCG/ACT inhaler Inhale 2 puffs into the lungs every 6 (six) hours as needed for wheezing or shortness of breath.  Marland Kitchen amLODipine (NORVASC) 10 MG tablet Take 1 tablet (10 mg total) by mouth daily.  Marland Kitchen aspirin 81 MG tablet Take 1 tablet (81 mg total) by mouth daily.  . calcitRIOL (ROCALTROL) 0.25 MCG capsule Take 0.25 mcg by mouth daily. Take one capsule only on Monday / Wednesday / FRIDAY  . carvedilol (COREG) 6.25 MG tablet TK 1 T PO BID  . cephALEXin (KEFLEX) 500 MG capsule Take 500 mg by mouth as needed.   . cloNIDine (CATAPRES) 0.2 MG tablet Take 1 tablet (0.2 mg total) by mouth 3 (three) times daily.  . diphenhydrAMINE (BENADRYL) 25 MG tablet Take 25 mg by mouth every 6 (six) hours as needed.  . ferrous sulfate 325 (65 FE) MG tablet Take 1 tablet (325 mg total) by mouth daily. With orange juice  . fluticasone (FLONASE) 50 MCG/ACT nasal spray Place 2 sprays into both nostrils daily.  Marland Kitchen gabapentin (NEURONTIN) 100 MG capsule Take 1 capsule (100 mg total) by mouth 3 (three) times daily. (Patient taking differently: Take 100 mg by mouth as needed. Usually takes only prior to bedtime to help him sleep)  . losartan (COZAAR) 100 MG tablet Take 100 mg by mouth daily.  . minoxidil (LONITEN) 2.5 MG tablet daily.  . sodium bicarbonate 325 MG tablet TK 1 T PO BID  . traMADol (ULTRAM) 50 MG tablet Take 1 tablet (50 mg total) by mouth 2 (two) times daily. (Patient taking differently: Take 50 mg  by mouth 2 (two) times daily as needed. )   Current Facility-Administered Medications for the 04/25/19 encounter (Office Visit) with Devina Bezold, Harrell Gave, MD  Medication  . ipratropium-albuterol (DUONEB) 0.5-2.5 (3) MG/3ML nebulizer solution 3 mL    Allergies: Irbesartan, Cymbalta [duloxetine hcl], Atorvastatin, and Bystolic [nebivolol hcl]  Social History   Tobacco Use  . Smoking status: Current Every Day Smoker    Packs/day: 1.00    Years: 50.00    Pack years: 50.00    Types: Cigarettes  . Smokeless tobacco: Never Used  Substance Use Topics  . Alcohol use: Not Currently  . Drug use: No    Family History  Problem Relation Age of Onset  . Hypertension Mother   . Cancer Mother        breast  . Aneurysm Mother   . Coronary artery disease Father   . Hypertension Father   . Stroke Father 92  . Heart disease Father   . Heart attack Father 54  . Aneurysm Maternal Grandmother        brain  . Aneurysm Paternal Grandmother        brain  . Coronary artery disease Paternal Grandfather   . Heart disease Brother        valvular heart disease  .  COPD Brother   . Hypertension Brother   . Stroke Paternal Uncle     Review of Systems: A 12-system review of systems was performed and was negative except as noted in the HPI.  --------------------------------------------------------------------------------------------------  Physical Exam: BP (!) 150/70 (BP Location: Left Arm, Patient Position: Sitting, Cuff Size: Normal)   Pulse (!) 57   Ht 5' 6"  (1.676 m)   Wt 147 lb 4 oz (66.8 kg)   SpO2 98%   BMI 23.77 kg/m   General:  NAD. HEENT: No conjunctival pallor or scleral icterus. Facemask in place. Neck: Supple without lymphadenopathy, thyromegaly, JVD, or HJR. Lungs: Normal work of breathing. Clear to auscultation bilaterally without wheezes or crackles. Heart: Regular rate and rhythm without murmurs, rubs, or gallops. Non-displaced PMI. Abd: Bowel sounds present. Soft, NT/ND  without hepatosplenomegaly Ext: 1+ pretibial edema bilaterally. Skin: Warm and dry without rash.  EKG:  Sinus bradycardia with LAFB, LVH, and poor R wave progression.  No significant change since prior tracing on 01/03/2019.  Lab Results  Component Value Date   WBC 11.1 (H) 02/26/2019   HGB 8.6 (L) 02/26/2019   HCT 25.0 (L) 02/26/2019   MCV 87.4 02/26/2019   PLT 203 02/26/2019    Lab Results  Component Value Date   NA 134 (L) 02/26/2019   K 5.0 02/26/2019   CL 104 02/26/2019   CO2 19 (L) 02/26/2019   BUN 67 (H) 02/26/2019   CREATININE 7.15 (H) 02/26/2019   GLUCOSE 98 02/26/2019   ALT 7 05/29/2018    Lab Results  Component Value Date   CHOL 160 05/29/2018   HDL 24.40 (L) 05/29/2018   LDLCALC 101 (H) 05/29/2018   LDLDIRECT 134.0 05/11/2017   TRIG 177.0 (H) 05/29/2018   CHOLHDL 7 05/29/2018    --------------------------------------------------------------------------------------------------  ASSESSMENT AND PLAN: Hypertension: Blood pressure mildly elevated today, improved from recent readings at home and dialysis training.  I think it would be prudent to continue his current regimen (including recently added minoxidil) and evaluate how his blood pressure responds to initiation of peritoneal dialysis.  If blood pressure remains elevated, I would favor trying an alpha blocker or long-acting nitrate in lieu of minoxidil.  Bradycardia precludes escalation of of carvedilol and clonidine.  Cardiomyopathy: LVEF moderately reduced by echo in August.  Subsequent Myoview showed two small areas of potential scar.  [name] is certainly at risk of underlying ischemic heart disease.  However, given stable DOE and lack of angina, I favor optimizing his blood pressure and establishing regular dialysis regiment before contemplating coronary angiography.  Coronary artery disease: Myoview in September was notable for two small areas of possible scar without significant ischemia.  Given  lack of angina, we have agreed to continue with medical therapy, including aspirin and blood pressure control.  He has been intolerant of statins in the past; we will readdress lipid control when we follow-up (most recent LDL was 101 in 05/2018).  ESRD: Mr. Torres is in the process of beginning peritoneal dialysis.  Follow-up: Virtual visit with me or APP to reassess BP in ~2 weeks.  [name], MD 04/26/2019 1:45 PM

## 2019-04-26 ENCOUNTER — Encounter: Payer: Self-pay | Admitting: Internal Medicine

## 2019-04-26 DIAGNOSIS — Z992 Dependence on renal dialysis: Secondary | ICD-10-CM | POA: Diagnosis not present

## 2019-04-26 DIAGNOSIS — D631 Anemia in chronic kidney disease: Secondary | ICD-10-CM | POA: Diagnosis not present

## 2019-04-26 DIAGNOSIS — N186 End stage renal disease: Secondary | ICD-10-CM | POA: Insufficient documentation

## 2019-04-26 DIAGNOSIS — I2510 Atherosclerotic heart disease of native coronary artery without angina pectoris: Secondary | ICD-10-CM | POA: Insufficient documentation

## 2019-04-26 DIAGNOSIS — D509 Iron deficiency anemia, unspecified: Secondary | ICD-10-CM | POA: Diagnosis not present

## 2019-04-26 DIAGNOSIS — I429 Cardiomyopathy, unspecified: Secondary | ICD-10-CM | POA: Insufficient documentation

## 2019-04-27 DIAGNOSIS — Z992 Dependence on renal dialysis: Secondary | ICD-10-CM | POA: Diagnosis not present

## 2019-04-27 DIAGNOSIS — N186 End stage renal disease: Secondary | ICD-10-CM | POA: Diagnosis not present

## 2019-04-27 DIAGNOSIS — D509 Iron deficiency anemia, unspecified: Secondary | ICD-10-CM | POA: Diagnosis not present

## 2019-04-27 DIAGNOSIS — D631 Anemia in chronic kidney disease: Secondary | ICD-10-CM | POA: Diagnosis not present

## 2019-04-28 DIAGNOSIS — D509 Iron deficiency anemia, unspecified: Secondary | ICD-10-CM | POA: Diagnosis not present

## 2019-04-28 DIAGNOSIS — N186 End stage renal disease: Secondary | ICD-10-CM | POA: Diagnosis not present

## 2019-04-28 DIAGNOSIS — Z992 Dependence on renal dialysis: Secondary | ICD-10-CM | POA: Diagnosis not present

## 2019-04-28 DIAGNOSIS — D631 Anemia in chronic kidney disease: Secondary | ICD-10-CM | POA: Diagnosis not present

## 2019-04-29 DIAGNOSIS — D631 Anemia in chronic kidney disease: Secondary | ICD-10-CM | POA: Diagnosis not present

## 2019-04-29 DIAGNOSIS — N186 End stage renal disease: Secondary | ICD-10-CM | POA: Diagnosis not present

## 2019-04-29 DIAGNOSIS — D509 Iron deficiency anemia, unspecified: Secondary | ICD-10-CM | POA: Diagnosis not present

## 2019-04-29 DIAGNOSIS — Z992 Dependence on renal dialysis: Secondary | ICD-10-CM | POA: Diagnosis not present

## 2019-04-30 DIAGNOSIS — Z992 Dependence on renal dialysis: Secondary | ICD-10-CM | POA: Diagnosis not present

## 2019-04-30 DIAGNOSIS — D509 Iron deficiency anemia, unspecified: Secondary | ICD-10-CM | POA: Diagnosis not present

## 2019-04-30 DIAGNOSIS — D631 Anemia in chronic kidney disease: Secondary | ICD-10-CM | POA: Diagnosis not present

## 2019-04-30 DIAGNOSIS — N186 End stage renal disease: Secondary | ICD-10-CM | POA: Diagnosis not present

## 2019-05-01 DIAGNOSIS — N186 End stage renal disease: Secondary | ICD-10-CM | POA: Diagnosis not present

## 2019-05-01 DIAGNOSIS — Z992 Dependence on renal dialysis: Secondary | ICD-10-CM | POA: Diagnosis not present

## 2019-05-01 DIAGNOSIS — D509 Iron deficiency anemia, unspecified: Secondary | ICD-10-CM | POA: Diagnosis not present

## 2019-05-01 DIAGNOSIS — D631 Anemia in chronic kidney disease: Secondary | ICD-10-CM | POA: Diagnosis not present

## 2019-05-02 DIAGNOSIS — Z992 Dependence on renal dialysis: Secondary | ICD-10-CM | POA: Diagnosis not present

## 2019-05-02 DIAGNOSIS — D631 Anemia in chronic kidney disease: Secondary | ICD-10-CM | POA: Diagnosis not present

## 2019-05-02 DIAGNOSIS — N186 End stage renal disease: Secondary | ICD-10-CM | POA: Diagnosis not present

## 2019-05-02 DIAGNOSIS — D509 Iron deficiency anemia, unspecified: Secondary | ICD-10-CM | POA: Diagnosis not present

## 2019-05-03 DIAGNOSIS — D631 Anemia in chronic kidney disease: Secondary | ICD-10-CM | POA: Diagnosis not present

## 2019-05-03 DIAGNOSIS — N186 End stage renal disease: Secondary | ICD-10-CM | POA: Diagnosis not present

## 2019-05-03 DIAGNOSIS — Z992 Dependence on renal dialysis: Secondary | ICD-10-CM | POA: Diagnosis not present

## 2019-05-03 DIAGNOSIS — D509 Iron deficiency anemia, unspecified: Secondary | ICD-10-CM | POA: Diagnosis not present

## 2019-05-04 DIAGNOSIS — D631 Anemia in chronic kidney disease: Secondary | ICD-10-CM | POA: Diagnosis not present

## 2019-05-04 DIAGNOSIS — D509 Iron deficiency anemia, unspecified: Secondary | ICD-10-CM | POA: Diagnosis not present

## 2019-05-04 DIAGNOSIS — N186 End stage renal disease: Secondary | ICD-10-CM | POA: Diagnosis not present

## 2019-05-04 DIAGNOSIS — Z992 Dependence on renal dialysis: Secondary | ICD-10-CM | POA: Diagnosis not present

## 2019-05-05 DIAGNOSIS — N186 End stage renal disease: Secondary | ICD-10-CM | POA: Diagnosis not present

## 2019-05-05 DIAGNOSIS — D509 Iron deficiency anemia, unspecified: Secondary | ICD-10-CM | POA: Diagnosis not present

## 2019-05-05 DIAGNOSIS — Z992 Dependence on renal dialysis: Secondary | ICD-10-CM | POA: Diagnosis not present

## 2019-05-05 DIAGNOSIS — D631 Anemia in chronic kidney disease: Secondary | ICD-10-CM | POA: Diagnosis not present

## 2019-05-06 DIAGNOSIS — Z992 Dependence on renal dialysis: Secondary | ICD-10-CM | POA: Diagnosis not present

## 2019-05-06 DIAGNOSIS — D631 Anemia in chronic kidney disease: Secondary | ICD-10-CM | POA: Diagnosis not present

## 2019-05-06 DIAGNOSIS — N186 End stage renal disease: Secondary | ICD-10-CM | POA: Diagnosis not present

## 2019-05-06 DIAGNOSIS — D509 Iron deficiency anemia, unspecified: Secondary | ICD-10-CM | POA: Diagnosis not present

## 2019-05-07 DIAGNOSIS — D631 Anemia in chronic kidney disease: Secondary | ICD-10-CM | POA: Diagnosis not present

## 2019-05-07 DIAGNOSIS — D509 Iron deficiency anemia, unspecified: Secondary | ICD-10-CM | POA: Diagnosis not present

## 2019-05-07 DIAGNOSIS — N186 End stage renal disease: Secondary | ICD-10-CM | POA: Diagnosis not present

## 2019-05-07 DIAGNOSIS — Z992 Dependence on renal dialysis: Secondary | ICD-10-CM | POA: Diagnosis not present

## 2019-05-08 DIAGNOSIS — N186 End stage renal disease: Secondary | ICD-10-CM | POA: Diagnosis not present

## 2019-05-08 DIAGNOSIS — D509 Iron deficiency anemia, unspecified: Secondary | ICD-10-CM | POA: Diagnosis not present

## 2019-05-08 DIAGNOSIS — Z992 Dependence on renal dialysis: Secondary | ICD-10-CM | POA: Diagnosis not present

## 2019-05-08 DIAGNOSIS — D631 Anemia in chronic kidney disease: Secondary | ICD-10-CM | POA: Diagnosis not present

## 2019-05-09 ENCOUNTER — Telehealth (INDEPENDENT_AMBULATORY_CARE_PROVIDER_SITE_OTHER): Payer: Medicare Other | Admitting: Nurse Practitioner

## 2019-05-09 ENCOUNTER — Encounter: Payer: Self-pay | Admitting: Nurse Practitioner

## 2019-05-09 ENCOUNTER — Other Ambulatory Visit: Payer: Self-pay

## 2019-05-09 VITALS — BP 123/67 | HR 71 | Ht 66.0 in | Wt 138.2 lb

## 2019-05-09 DIAGNOSIS — C785 Secondary malignant neoplasm of large intestine and rectum: Secondary | ICD-10-CM

## 2019-05-09 DIAGNOSIS — I10 Essential (primary) hypertension: Secondary | ICD-10-CM

## 2019-05-09 DIAGNOSIS — Z79899 Other long term (current) drug therapy: Secondary | ICD-10-CM

## 2019-05-09 DIAGNOSIS — I428 Other cardiomyopathies: Secondary | ICD-10-CM | POA: Diagnosis not present

## 2019-05-09 DIAGNOSIS — D509 Iron deficiency anemia, unspecified: Secondary | ICD-10-CM | POA: Diagnosis not present

## 2019-05-09 DIAGNOSIS — I2510 Atherosclerotic heart disease of native coronary artery without angina pectoris: Secondary | ICD-10-CM | POA: Diagnosis not present

## 2019-05-09 DIAGNOSIS — G4730 Sleep apnea, unspecified: Secondary | ICD-10-CM

## 2019-05-09 DIAGNOSIS — Z992 Dependence on renal dialysis: Secondary | ICD-10-CM

## 2019-05-09 DIAGNOSIS — N186 End stage renal disease: Secondary | ICD-10-CM | POA: Diagnosis not present

## 2019-05-09 DIAGNOSIS — E785 Hyperlipidemia, unspecified: Secondary | ICD-10-CM

## 2019-05-09 DIAGNOSIS — Z905 Acquired absence of kidney: Secondary | ICD-10-CM

## 2019-05-09 DIAGNOSIS — Z85520 Personal history of malignant carcinoid tumor of kidney: Secondary | ICD-10-CM

## 2019-05-09 DIAGNOSIS — I739 Peripheral vascular disease, unspecified: Secondary | ICD-10-CM

## 2019-05-09 DIAGNOSIS — I120 Hypertensive chronic kidney disease with stage 5 chronic kidney disease or end stage renal disease: Secondary | ICD-10-CM | POA: Diagnosis not present

## 2019-05-09 DIAGNOSIS — D631 Anemia in chronic kidney disease: Secondary | ICD-10-CM | POA: Diagnosis not present

## 2019-05-09 DIAGNOSIS — I429 Cardiomyopathy, unspecified: Secondary | ICD-10-CM

## 2019-05-09 MED ORDER — EZETIMIBE 10 MG PO TABS: 10.0000 mg | ORAL_TABLET | Freq: Every day | ORAL | 3 refills | Status: DC

## 2019-05-09 MED ORDER — DOXAZOSIN MESYLATE 2 MG PO TABS: 2.0000 mg | ORAL_TABLET | Freq: Every day | ORAL | 3 refills | Status: DC

## 2019-05-09 NOTE — Patient Instructions (Signed)
Medication Instructions:  1- STOP Minoxidil 2- START Cardura Take 1 tablet (2 mg total) by mouth daily. 3- START Zetia Take 1 tablet (10 mg total) by mouth daily. *If you need a refill on your cardiac medications before your next appointment, please call your pharmacy*  Lab Work: None ordered  If you have labs (blood work) drawn today and your tests are completely normal, you will receive your results only by: Marland Kitchen MyChart Message (if you have MyChart) OR . A paper copy in the mail If you have any lab test that is abnormal or we need to change your treatment, we will call you to review the results.  Testing/Procedures: None ordered   Follow-Up: At [facility], you and your health needs are our priority.  As part of our continuing mission to provide you with exceptional heart care, we have created designated Provider Care Teams.  These Care Teams include your primary Cardiologist (physician) and Advanced Practice Providers (APPs -  Physician Assistants and Nurse Practitioners) who all work together to provide you with the care you need, when you need it.  Your next appointment:   1 month(s)  The format for your next appointment:   Virtual  Provider:    You may see [name], MD or Hayashi Hodgkins, NP.

## 2019-05-09 NOTE — Progress Notes (Signed)
Virtual Visit via Telephone Note   This visit type was conducted due to national recommendations for restrictions regarding the COVID-19 Pandemic (e.g. social distancing) in an effort to limit this patient's exposure and mitigate transmission in our community.  Due to his co-morbid illnesses, this patient is at least at moderate risk for complications without adequate follow up.  This format is felt to be most appropriate for this patient at this time.  The patient did not have access to video technology/had technical difficulties with video requiring transitioning to audio format only (telephone).  All issues noted in this document were discussed and addressed.  No physical exam could be performed with this format.  Please refer to the patient's chart for his  consent to telehealth for [facility]. Evaluation Performed:  Follow-up visit  This visit type was conducted due to national recommendations for restrictions regarding the COVID-19 Pandemic (e.g. social distancing).  This format is felt to be most appropriate for this patient at this time.  All issues noted in this document were discussed and addressed.  No physical exam was performed (except for noted visual exam findings with Video Visits).  Please refer to the patient's chart (MyChart message for video visits and phone note for telephone visits) for the patient's consent to telehealth for [facility]. _____________   Date:  05/09/2019   Patient ID:  [name], DOB November 16, 1945, MRN [id number] Patient Location:  home Provider location:   office  Primary Care Provider:  Crecencio Mc, MD Primary Cardiologist:  [name], MD  Chief Complaint    73 year old male with a history of end-stage renal disease on peritoneal dialysis, cardiomyopathy with an EF of 40-45%, abnormal stress test, hypertension, hyperlipidemia, peripheral arterial disease, sleep apnea, renal cell carcinoma status post left nephrectomy, and  adenocarcinoma of the appendix, who presents for follow-up related to hypertension and cardiomyopathy.  Past Medical History    Past Medical History:  Diagnosis Date  . Adenocarcinoma of appendix [facility]) Jan 2006   right kidney, s/p cryoablation  . Cardiomyopathy ([place])    a. 12/2018 Echo: EF 40-45%, global HK. Nl RV fxn. Mild-mod LAE. Mild AI, Mild-mod MR. Asc Ao 3.7cm; b. 01/2019 Lexi MV: small, mild, fixed basal and mid antlat defect - scar vs artifact. Small, mild mid and apical inf minimally reversible defect, likely scar w/ peri-infarct ischemia. Coronary and Ao atherosclerosis.  . Complication of anesthesia    had to be woken up slowly as his bp was elevated when did this quickly  . ESRD (end stage renal disease) ([place])    a. Peritoneal Dialysis pt.  . Hyperlipidemia   . Hypertension   . Migraine    cluster  . Neuromuscular disorder ([place])    left lower extrem neuropathy  . Obstructive sleep apnea    no OSA since had facial surgery with dr. Kathyrn Sheriff in 1997  . PAD (peripheral artery disease) [facility]) Feb 2009   nonobstructing, renal angiogram ([place])  . Renal cell carcinoma 2004   left kidney heminephrectomy  . TIA (transient ischemic attack)    no residual but left leg and foot still feel heavy  . tobacco abuse    Past Surgical History:  Procedure Laterality Date  . CAPD INSERTION N/A 03/01/2019   Procedure: LAPAROSCOPIC INSERTION CONTINUOUS AMBULATORY PERITONEAL DIALYSIS  (CAPD) CATHETER;  Surgeon: Algernon Huxley, MD;  Location: [facility] ORS;  Service: Vascular;  Laterality: N/A;  . CARDIAC CATHETERIZATION     Dr. [name] did this to  assess his renal artery  . cyst removal  12/25/2015   Spine L4 and L5  . heminephrectomy  2004   for renal cell CA  . RENAL CRYOABLATION  Jan 2006   right kidney,  [name]  . sciatica      Allergies  Allergies  Allergen Reactions  . Irbesartan Other (See Comments)    hyperkalemia  . Cymbalta [Duloxetine Hcl]   . Atorvastatin Other (See Comments)      Muscle pain  . Bystolic [Nebivolol Hcl]     Extreme fatigue     History of Present Illness    [name] is a 73 y.o. male who presents via audio/video conferencing for a telehealth visit today.  He does not have access to a video source and therefore, this was carried out over the phone.  As noted above, he has a history of end-stage renal disease on peritoneal dialysis, cardiomyopathy with an EF of 40-45%, hypertension, hyperlipidemia, peripheral arterial disease, sleep apnea, renal cell carcinoma status post left nephrectomy, and adenocarcinoma of the appendix.  He was previously seen in August for preoperative evaluation pending peritoneal dialysis catheter placement.  In the setting of her chronic exertional dyspnea, echocardiogram was performed and showed an EF of 40-45% with global hypokinesis, mild AI, mild to moderate MR, and mild dilation of the ascending aorta at 3.7 cm.  Given LV dysfunction, this was followed by stress testing which showed a small, mild, fixed basal and mid anterolateral defect as well as a small, mild mid and apical inferior minimally reversible defect which was felt to be scar with peri-infarct ischemia.  As the patient has not been having any symptoms and with renal failure with recent initiation of peritoneal dialysis and ongoing hypertension, he has been medically managed.  Since his last visit, he has done reasonably well from a cardiac standpoint.  He has some degree of chronic dyspnea exertion which is unchanged.  He denies chest pain.  He has been doing peritoneal dialysis at night and had significant pain when using the mechanical drain and as a result, he has since been doing this manually.  Since starting peritoneal dialysis, he is also had intermittent gas and constipation.  He notes that his blood pressure is usually high in the evenings-160s to 170s but that in the mornings, after taking his medication, it will drop into the 120s to 130s and he will have  associated weakness and malaise.  He feels that he just does better when his blood pressure runs a little bit higher and thinks the minoxidil is too strong.  He has not had any presyncope or syncope.  His weight has been stable at about 138 pounds and he denies PND, orthopnea, palpitations, edema, or early satiety.  The patient does not have symptoms concerning for COVID-19 infection (fever, chills, cough, or new shortness of breath).   Home Medications    Prior to Admission medications   Medication Sig Start Date End Date Taking? Authorizing Provider  albuterol (VENTOLIN HFA) 108 (90 Base) MCG/ACT inhaler Inhale 2 puffs into the lungs every 6 (six) hours as needed for wheezing or shortness of breath. 04/12/19  Yes Crecencio Mc, MD  amLODipine (NORVASC) 10 MG tablet Take 1 tablet (10 mg total) by mouth daily. 02/27/19  Yes Crecencio Mc, MD  aspirin 81 MG tablet Take 1 tablet (81 mg total) by mouth daily. 05/16/14  Yes Crecencio Mc, MD  calcitRIOL (ROCALTROL) 0.25 MCG capsule Take 0.25 mcg by  mouth daily. Take one capsule only on Monday / Wednesday / FRIDAY 03/04/18  Yes [provider]  carvedilol (COREG) 6.25 MG tablet Take 6.25 mg by mouth 2 (two) times daily with a meal.  07/07/17  Yes [provider]  cephALEXin (KEFLEX) 500 MG capsule Take 500 mg by mouth as needed.  04/11/19  Yes [provider]  cloNIDine (CATAPRES) 0.2 MG tablet Take 1 tablet (0.2 mg total) by mouth 3 (three) times daily. 01/03/19  Yes End, Harrell Gave, MD  diphenhydrAMINE (BENADRYL) 25 MG tablet Take 25 mg by mouth every 6 (six) hours as needed.   Yes [provider]  ferrous sulfate 325 (65 FE) MG tablet Take 1 tablet (325 mg total) by mouth daily. With orange juice 07/26/17  Yes Crecencio Mc, MD  fluticasone (FLONASE) 50 MCG/ACT nasal spray Place 2 sprays into both nostrils daily. 05/29/18  Yes Crecencio Mc, MD  gabapentin (NEURONTIN) 100 MG capsule Take 1 capsule (100 mg  total) by mouth 3 (three) times daily. Patient taking differently: Take 100 mg by mouth as needed. Usually takes only prior to bedtime to help him sleep 12/27/18  Yes Crecencio Mc, MD  HYDROcodone-acetaminophen (NORCO) 5-325 MG tablet Take 1 tablet by mouth every 6 (six) hours as needed for moderate pain. 03/01/19  Yes Dew, Erskine Squibb, MD  losartan (COZAAR) 100 MG tablet Take 100 mg by mouth daily. 04/11/19  Yes [provider]  minoxidil (LONITEN) 2.5 MG tablet daily. 04/23/19  Yes [provider]  sodium bicarbonate 325 MG tablet TK 1 T PO BID 02/14/18  Yes [provider]  traMADol (ULTRAM) 50 MG tablet Take 1 tablet (50 mg total) by mouth 2 (two) times daily. Patient taking differently: Take 50 mg by mouth 2 (two) times daily as needed.  04/12/19  Yes Crecencio Mc, MD    Review of Systems    Malaise and weakness after taking a.m. medications with drops in blood pressure into the 120s and 130s.  He has some degree of chronic dyspnea on exertion which is unchanged.  He denies chest pain, palpitations, PND, orthopnea, dizziness, syncope, edema, or early satiety.  All other systems reviewed and are otherwise negative except as noted above.  Physical Exam    Vital Signs:  BP 123/67 (BP Location: Right Arm, Patient Position: Sitting, Cuff Size: Normal) Comment: took medications 30 minutes prior to taking BP reading  Pulse 71   Ht 5\' 6"  (1.676 m)   Wt 138 lb 4 oz (62.7 kg)   BMI 22.31 kg/m   Blood pressure was initially 166/79 however about 30 minutes after taking his medicine, he repeated it and got 123/67.  He is awake alert and oriented x3.  Normal affect.  Accessory Clinical Findings    None  Assessment & Plan    1.  Cardiomyopathy: Patient with a history of chronic dyspnea on exertion and echocardiogram in August of this year revealing an EF of 40 to 45% with global hypokinesis.  Follow-up stress testing with basal and anterolateral scar versus artifact and  apical scar with peri-infarct ischemia.  He has not had any chest pain and as result, he has been medically managed.  He remains on beta-blocker and ARB therapy.  He still makes urine but as noted, also performs peritoneal dialysis at night.  His weight has been stable at 138 pounds at home.  2.  Essential hypertension: This is been difficult to manage historically.  Since being placed on  minoxidil, he has noted fairly large swings in blood pressure within about an hour of taking his a.m. medications with associated malaise and fatigue.  We discussed our preference to get him off of minoxidil if possible.  He was agreeable and I will switch him to Cardura 2 mg daily.  This may allow for more gradual blood pressure lowering and further titration if necessary.  He otherwise remains on beta-blocker, ARB, calcium channel blocker, and clonidine.  The heart rate is reported at 71 today, bradycardia previously noted, limiting titration of beta-blocker or clonidine.  3.  Coronary artery disease: Stress testing showed 2 small areas of possible scar without significant ischemia.  As above, with lack of angina, he will continue on medical therapy with a focus on blood pressure management.  LDL was 101 in January 2020.  He is previously intolerant to statins.  We discussed the addition of Zetia today and he is agreeable.  We will plan to follow-up lipids and LFTs in about 4 to 6 weeks, and can schedule at next follow-up.  4.  End-stage renal disease: Now on peritoneal dialysis.  He had issues with the mechanical drain causing significant pain, so he is now draining manually.  Overall, he feels as though he is tolerating this.  5.  Disposition: Follow-up via virtual visit with Dr. Saunders Revel in 1 month or sooner if necessary.  COVID-19 Education: The signs and symptoms of COVID-19 were discussed with the patient and how to seek care for testing (follow up with PCP or arrange E-visit).  The importance of social distancing was  discussed today.  Patient Risk:   After full review of this patient's history and clinical status, I feel that he is at least moderate risk for cardiac complications at this time, thus necessitating a telehealth visit sooner than our first available in office visit.  Time:   Today, I have spent 25 minutes with the patient with telehealth technology discussing medical history, symptoms, and management plan.    Plemons Hodgkins, NP 05/09/2019, 11:02 AM

## 2019-05-09 NOTE — Progress Notes (Deleted)
Virtual Visit via Video Note   This visit type was conducted due to national recommendations for restrictions regarding the COVID-19 Pandemic (e.g. social distancing) in an effort to limit this patient's exposure and mitigate transmission in our community.  Due to his co-morbid illnesses, this patient is at least at moderate risk for complications without adequate follow up.  This format is felt to be most appropriate for this patient at this time.  All issues noted in this document were discussed and addressed.  A limited physical exam was performed with this format.  Please refer to the patient's chart for his consent to telehealth for [facility].   Date:  05/09/2019   ID:  [name], DOB 07-26-1945, MRN [id number]  Patient Location: Home Provider Location: Office  PCP:  Crecencio Mc, MD  Cardiologist:  [name], MD  Electrophysiologist:  None   Evaluation Performed:  Follow-Up Visit  Chief Complaint:  1 month Follow-up, Hypertension  History of Present Illness:    [name] is a 73 y.o. male with h/o cardiomyopathy with moderately reduced LVEF, hypertension, hyperlipidemia, OSA, peripheral vascular disease, renal cell carcinoma s/p left nephrectomy, adenocarcinoma of the appendix, current tobacco use, and ESRD with recent initiation of peritoneal dialysis.  He is being seen today for 1 month follow-up of hypertension after his initial visit with Dr. Harrell Gave and on 04/25/2019.  He was seen 12/2018 for preoperative risk assessment and reported chronic exertional dyspnea but otherwise was asymptomatic.  Subsequent echo revealed moderately reduced left ventricular systolic function and mild dilation of the ascending aorta.  It also noted mild aortic and mild to moderate MR.  Subsequent stress test showed small, fixed basal, and mid anterolateral defect, most likely representing scar and less likely artifact.  A small, mild, minimally reversible mid and apical  inferior defect representing scar and possibly peri-infarct ischemia was also noted.  Coronary artery and aortic atherosclerosis were present.  At that time, he had no concerning / unstable symptoms. When seen 04/2019, he reported BP up to 222/100 with a single episode of sudden right shoulder pain that shot across his right upper chest and resulted in R  shoulder soreness for several hours thereafter. The episode occurred while bending over. No report of exertional CP. Since starting minoxidil, his BP had been lower with SBP 140-116mmHg. Tranient orthostatic lightheadedness was reported with start of this medication.  It was noted BP seemed to improve with alteration of dialysis solution. He was starting home PD that same evening.  Recommendations were for optimizing his blood pressure and establishing regular dialysis regimen before catheterization and given the lack of angina and stable DOE. He was noted to have been intolerant to statins in the past with recommendation to readdress lipid control at follow-up.   The patient {does/does not:200015} have symptoms concerning for COVID-19 infection (fever, chills, cough, or new shortness of breath).    Past Medical History:  Diagnosis Date  . Adenocarcinoma of appendix [facility]) Jan 2006   right kidney, s/p cryoablation  . Complication of anesthesia    had to be woken up slowly as his bp was elevated when did this quickly  . Hyperlipidemia   . Hypertension   . Migraine    cluster  . Neuromuscular disorder ([place])    left lower extrem neuropathy  . Obstructive sleep apnea    no OSA since had facial surgery with dr. Kathyrn Sheriff in 1997  . PAD (peripheral artery disease) [facility]) Feb 2009  nonobstructing, renal angiogram ([place])  . Renal cell carcinoma 2004   left kidney heminephrectomy  . TIA (transient ischemic attack)    no residual but left leg and foot still feel heavy  . tobacco abuse    Past Surgical History:  Procedure Laterality Date  . CAPD  INSERTION N/A 03/01/2019   Procedure: LAPAROSCOPIC INSERTION CONTINUOUS AMBULATORY PERITONEAL DIALYSIS  (CAPD) CATHETER;  Surgeon: Algernon Huxley, MD;  Location: [facility] ORS;  Service: Vascular;  Laterality: N/A;  . CARDIAC CATHETERIZATION     Dr. [name] did this to assess his renal artery  . cyst removal  12/25/2015   Spine L4 and L5  . heminephrectomy  2004   for renal cell CA  . RENAL CRYOABLATION  Jan 2006   right kidney,  [name]  . sciatica       No outpatient medications have been marked as taking for the 05/09/19 encounter (Appointment) with Theora Gianotti, NP.   Current Facility-Administered Medications for the 05/09/19 encounter (Appointment) with Theora Gianotti, NP  Medication  . ipratropium-albuterol (DUONEB) 0.5-2.5 (3) MG/3ML nebulizer solution 3 mL     Allergies:   Irbesartan, Cymbalta [duloxetine hcl], Atorvastatin, and Bystolic [nebivolol hcl]   Social History   Tobacco Use  . Smoking status: Current Every Day Smoker    Packs/day: 1.00    Years: 50.00    Pack years: 50.00    Types: Cigarettes  . Smokeless tobacco: Never Used  Substance Use Topics  . Alcohol use: Not Currently  . Drug use: No     Family Hx: The patient's family history includes Aneurysm in his maternal grandmother, mother, and paternal grandmother; COPD in his brother; Cancer in his mother; Coronary artery disease in his father and paternal grandfather; Heart attack (age of onset: 67) in his father; Heart disease in his brother and father; Hypertension in his brother, father, and mother; Stroke in his paternal uncle; Stroke (age of onset: 18) in his father.  ROS:   Please see the history of present illness.    *** All other systems reviewed and are negative.   Prior CV studies:   The following studies were reviewed today:  ***  Labs/Other Tests and Data Reviewed:    EKG:  {EKG/Telemetry Strips Reviewed:[phone]}  Recent Labs: 05/29/2018: ALT 7 02/26/2019: BUN 67;  Creatinine, Ser 7.15; Hemoglobin 8.6; Platelets 203; Potassium 5.0; Sodium 134   Recent Lipid Panel Lab Results  Component Value Date/Time   CHOL 160 05/29/2018 10:29 AM   CHOL 159 01/04/2012 04:28 AM   TRIG 177.0 (H) 05/29/2018 10:29 AM   TRIG 218 (H) 01/04/2012 04:28 AM   HDL 24.40 (L) 05/29/2018 10:29 AM   HDL 17 (L) 01/04/2012 04:28 AM   CHOLHDL 7 05/29/2018 10:29 AM   LDLCALC 101 (H) 05/29/2018 10:29 AM   LDLCALC 98 01/04/2012 04:28 AM   LDLDIRECT 134.0 05/11/2017 02:50 PM    Wt Readings from Last 3 Encounters:  04/25/19 147 lb 4 oz (66.8 kg)  04/12/19 146 lb 9.6 oz (66.5 kg)  03/22/19 142 lb (64.4 kg)     Objective:    Vital Signs:  There were no vitals taken for this visit.   {[facility] Virtual Exam (Optional):[id number]::"VITAL SIGNS:  reviewed"}  ASSESSMENT & PLAN:    1. ***  HTN, CM, ESRD  COVID-19 Education: The signs and symptoms of COVID-19 were discussed with the patient and how to seek care for testing (follow up with PCP or arrange E-visit).  ***The importance of  social distancing was discussed today.  Time:   Today, I have spent *** minutes with the patient with telehealth technology discussing the above problems.     Medication Adjustments/Labs and Tests Ordered: Current medicines are reviewed at length with the patient today.  Concerns regarding medicines are outlined above.   Tests Ordered: No orders of the defined types were placed in this encounter.   Medication Changes: No orders of the defined types were placed in this encounter.   Follow Up:  {F/U Format:[phone]} {follow up:15908}  Signed, Arvil Chaco, PA-C  05/09/2019 8:20 AM    [facility]

## 2019-05-10 ENCOUNTER — Telehealth: Payer: Self-pay | Admitting: Cardiovascular Disease

## 2019-05-10 DIAGNOSIS — N186 End stage renal disease: Secondary | ICD-10-CM | POA: Diagnosis not present

## 2019-05-10 DIAGNOSIS — D509 Iron deficiency anemia, unspecified: Secondary | ICD-10-CM | POA: Diagnosis not present

## 2019-05-10 DIAGNOSIS — D631 Anemia in chronic kidney disease: Secondary | ICD-10-CM | POA: Diagnosis not present

## 2019-05-10 DIAGNOSIS — Z992 Dependence on renal dialysis: Secondary | ICD-10-CM | POA: Diagnosis not present

## 2019-05-10 NOTE — Telephone Encounter (Signed)
L MOM to call and schedule 1 month f/u

## 2019-05-11 ENCOUNTER — Ambulatory Visit (INDEPENDENT_AMBULATORY_CARE_PROVIDER_SITE_OTHER): Payer: Medicare Other | Admitting: Internal Medicine

## 2019-05-11 ENCOUNTER — Encounter: Payer: Self-pay | Admitting: Internal Medicine

## 2019-05-11 DIAGNOSIS — D509 Iron deficiency anemia, unspecified: Secondary | ICD-10-CM | POA: Diagnosis not present

## 2019-05-11 DIAGNOSIS — M7138 Other bursal cyst, other site: Secondary | ICD-10-CM

## 2019-05-11 DIAGNOSIS — M5431 Sciatica, right side: Secondary | ICD-10-CM

## 2019-05-11 DIAGNOSIS — D631 Anemia in chronic kidney disease: Secondary | ICD-10-CM | POA: Diagnosis not present

## 2019-05-11 DIAGNOSIS — Z992 Dependence on renal dialysis: Secondary | ICD-10-CM | POA: Diagnosis not present

## 2019-05-11 DIAGNOSIS — K5900 Constipation, unspecified: Secondary | ICD-10-CM

## 2019-05-11 DIAGNOSIS — N186 End stage renal disease: Secondary | ICD-10-CM | POA: Diagnosis not present

## 2019-05-11 MED ORDER — LACTULOSE 20 GM/30ML PO SOLN: ORAL | 5 refills | Status: DC

## 2019-05-11 NOTE — Progress Notes (Signed)
Telephone Note  This visit type was conducted due to national recommendations for restrictions regarding the COVID-19 pandemic (e.g. social distancing).  This format is felt to be most appropriate for this patient at this time.  All issues noted in this document were discussed and addressed.  No physical exam was performed (except for noted visual exam findings with Video Visits).   I connected with@ on 05/11/19 at  9:30 AM EST by telephone and verified that I am speaking with the correct person using two identifiers. Location patient: home Location provider: work or home office Persons participating in the virtual visit: patient, provider  I discussed the limitations, risks, security and privacy concerns of performing an evaluation and management service by telephone and the availability of in person appointments. I also discussed with the patient that there may be a patient responsible charge related to this service. The patient expressed understanding and agreed to proceed.  Reason for visit: 4 week follow up  On bilateral hip pain   HPI:  73 yr old male with ESKD  And chronic bilateral hip and lower back pain, numbness and tingling in left leg and relieved with sitting.  is brought on with walking   With recent imaging done by MRI  Lumbar spine non revealing for stenosis,  Prior partial right hemilaminectomy and 5 mm synovial cyst at L5-S1 level on the right noted.  He was referred to Dr Lacinda Axon, Neurosurgery at [facility] for consultation and there was no surgical indication .  He was referred for vascular and neurologic evaluation   ABI's are normal Neuropathy diagnosed by neurology   He Was given back extension exercises one month ago and tramadol  for bid use.  He reports that the pain has not resolved, but has improved  Using tramadol prn  Started peritoneal dialysis, had immediate  "drain pain" that was significant and localized to groin,  Pain attributed  to constipation.  Using  miralax which has not helped.  Discussed use of lactulose  Labs reviewed (done by nephrology) and ok.   Taking cardura 2 mg started yesterday by cardiology after preoperative eval, and minoxidil started ("too harsh") . Has not tolerated normotensive readings (feels too weak when 120/70)    ROS: See pertinent positives and negatives per HPI.  Past Medical History:  Diagnosis Date  . 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 Cardiomyopathy ([place])    a. 12/2018 Echo: EF 40-45%, global HK. Nl RV fxn. Mild-mod LAE. Mild AI, Mild-mod MR. Asc Ao 3.7cm; b. 01/2019 Lexi MV: small, mild, fixed basal and mid antlat defect - scar vs artifact. Small, mild mid and apical inf minimally reversible defect, likely scar w/ peri-infarct ischemia. Coronary and Ao atherosclerosis.  . Complication of anesthesia    had to be woken up slowly as his bp was elevated when did this quickly  . ESRD (end stage renal disease) ([place])    a. Peritoneal Dialysis pt.  . Hyperlipidemia   . Hypertension   . Migraine    cluster  . Neuromuscular disorder ([place])    left lower extrem neuropathy  . Obstructive sleep apnea    no OSA since had facial surgery with dr. Kathyrn Sheriff in 1997  . PAD (peripheral artery disease) [facility]) Feb 2009   nonobstructing, renal angiogram ([place])  . Renal cell carcinoma 2004   left kidney heminephrectomy  . TIA (transient ischemic attack)    no residual but left leg and foot still  feel heavy  . tobacco abuse     Past Surgical History:  Procedure Laterality Date  . CAPD INSERTION N/A 03/01/2019   Procedure: LAPAROSCOPIC INSERTION CONTINUOUS AMBULATORY PERITONEAL DIALYSIS  (CAPD) CATHETER;  Surgeon: Algernon Huxley, MD;  Location: [facility] ORS;  Service: Vascular;  Laterality: N/A;  . CARDIAC CATHETERIZATION     Dr. [name] did this to assess his renal artery  . cyst removal  12/25/2015   Spine L4 and L5  . heminephrectomy  2004   for renal cell CA  . RENAL CRYOABLATION   Jan 2006   right kidney,  [name]  . sciatica      Family History  Problem Relation Age of Onset  . Hypertension Mother   . Cancer Mother        breast  . Aneurysm Mother   . Coronary artery disease Father   . Hypertension Father   . Stroke Father 69  . Heart disease Father   . Heart attack Father 20  . Aneurysm Maternal Grandmother        brain  . Aneurysm Paternal Grandmother        brain  . Coronary artery disease Paternal Grandfather   . Heart disease Brother        valvular heart disease  . COPD Brother   . Hypertension Brother   . Stroke Paternal Uncle     SOCIAL HX:  reports that he has been smoking cigarettes. He has a 50.00 pack-year smoking history. He has never used smokeless tobacco. He reports previous alcohol use. He reports that he does not use drugs.   Current Outpatient Medications:  .  albuterol (VENTOLIN HFA) 108 (90 Base) MCG/ACT inhaler, Inhale 2 puffs into the lungs every 6 (six) hours as needed for wheezing or shortness of breath., Disp: 18 g, Rfl: 0 .  amLODipine (NORVASC) 10 MG tablet, Take 1 tablet (10 mg total) by mouth daily., Disp: 90 tablet, Rfl: 1 .  aspirin 81 MG tablet, Take 1 tablet (81 mg total) by mouth daily., Disp: 30 tablet, Rfl: 11 .  calcitRIOL (ROCALTROL) 0.25 MCG capsule, Take 0.25 mcg by mouth daily. Take one capsule only on Monday / Wednesday / FRIDAY, Disp: , Rfl: 3 .  carvedilol (COREG) 6.25 MG tablet, Take 6.25 mg by mouth 2 (two) times daily with a meal. , Disp: , Rfl: 6 .  cephALEXin (KEFLEX) 500 MG capsule, Take 500 mg by mouth as needed. , Disp: , Rfl:  .  cloNIDine (CATAPRES) 0.2 MG tablet, Take 1 tablet (0.2 mg total) by mouth 3 (three) times daily., Disp: 270 tablet, Rfl: 3 .  diphenhydrAMINE (BENADRYL) 25 MG tablet, Take 25 mg by mouth every 6 (six) hours as needed., Disp: , Rfl:  .  doxazosin (CARDURA) 2 MG tablet, Take 1 tablet (2 mg total) by mouth daily., Disp: 90 tablet, Rfl: 3 .  ezetimibe (ZETIA) 10 MG tablet, Take  1 tablet (10 mg total) by mouth daily., Disp: 90 tablet, Rfl: 3 .  ferrous sulfate 325 (65 FE) MG tablet, Take 1 tablet (325 mg total) by mouth daily. With orange juice, Disp: 90 tablet, Rfl: 3 .  fluticasone (FLONASE) 50 MCG/ACT nasal spray, Place 2 sprays into both nostrils daily., Disp: 16 g, Rfl: 6 .  gabapentin (NEURONTIN) 100 MG capsule, Take 1 capsule (100 mg total) by mouth 3 (three) times daily. (Patient taking differently: Take 100 mg by mouth as needed. Usually takes only prior to bedtime to help him sleep),  Disp: 90 capsule, Rfl: 1 .  losartan (COZAAR) 100 MG tablet, Take 100 mg by mouth daily., Disp: , Rfl:  .  sodium bicarbonate 325 MG tablet, TK 1 T PO BID, Disp: , Rfl: 1 .  traMADol (ULTRAM) 50 MG tablet, Take 1 tablet (50 mg total) by mouth 2 (two) times daily. (Patient taking differently: Take 50 mg by mouth 2 (two) times daily as needed. ), Disp: 60 tablet, Rfl: 5 .  Lactulose 20 GM/30ML SOLN, 30 ml daily for constipation, Disp: 946 mL, Rfl: 5  Current Facility-Administered Medications:  .  ipratropium-albuterol (DUONEB) 0.5-2.5 (3) MG/3ML nebulizer solution 3 mL, 3 mL, Nebulization, Q6H, Derrel Nip, Aris Everts, MD  EXAM:   General impression: alert, cooperative and articulate.  No signs of being in distress  Lungs: speech is fluent sentence length suggests that patient is not short of breath and not punctuated by cough, sneezing or sniffing. Marland Kitchen   Psych: affect normal.  speech is articulate and non pressured .  Denies suicidal thoughts   ASSESSMENT AND PLAN:  Discussed the following assessment and plan:  Constipation, unspecified constipation type  Sciatica of right side  Synovial cyst of lumbar spine  Constipation Trial of lactulose twice weekly to mange constipation which is causing drain pain."  Sciatica of right side Improved with daily  use of extension exercises.  Not a candidate for surgery   Synovial cyst of lumbar spine Resected with prior back surgery , per  review of records from [facility] .   Repeat MRI notes a 5 mm cyst on the right at L5-S1 which not responsible for patient's current pain and dysfunction per Neurosurgical consult August 2020     I discussed the assessment and treatment plan with the patient. The patient was provided an opportunity to ask questions and all were answered. The patient agreed with the plan and demonstrated an understanding of the instructions.   The patient was advised to call back or seek an in-person evaluation if the symptoms worsen or if the condition fails to improve as anticipated.  I provided  25 minutes of non-face-to-face time during this encounter reviewing patient's current problems and past procedures/imaging studies, providing counseling on the above mentioned problems , and coordination  of care .  Crecencio Mc, MD

## 2019-05-11 NOTE — Patient Instructions (Signed)
I am prescribing lactulose for your constipation  The usual dose is 30 ml once or twice daily as needed to produce a daily bowel movement   YOU CAN KEEP DOING THE BACK EXERCISES SEVERAL TIMES DAILY AS LONG AS IT HELPS RELIEVE YOUR BACK AND HIP PAIN

## 2019-05-12 DIAGNOSIS — D509 Iron deficiency anemia, unspecified: Secondary | ICD-10-CM | POA: Diagnosis not present

## 2019-05-12 DIAGNOSIS — N186 End stage renal disease: Secondary | ICD-10-CM | POA: Diagnosis not present

## 2019-05-12 DIAGNOSIS — Z992 Dependence on renal dialysis: Secondary | ICD-10-CM | POA: Diagnosis not present

## 2019-05-12 DIAGNOSIS — D631 Anemia in chronic kidney disease: Secondary | ICD-10-CM | POA: Diagnosis not present

## 2019-05-13 DIAGNOSIS — D631 Anemia in chronic kidney disease: Secondary | ICD-10-CM | POA: Diagnosis not present

## 2019-05-13 DIAGNOSIS — D509 Iron deficiency anemia, unspecified: Secondary | ICD-10-CM | POA: Diagnosis not present

## 2019-05-13 DIAGNOSIS — N186 End stage renal disease: Secondary | ICD-10-CM | POA: Diagnosis not present

## 2019-05-13 DIAGNOSIS — Z992 Dependence on renal dialysis: Secondary | ICD-10-CM | POA: Diagnosis not present

## 2019-05-13 DIAGNOSIS — K5900 Constipation, unspecified: Secondary | ICD-10-CM | POA: Insufficient documentation

## 2019-05-13 NOTE — Assessment & Plan Note (Signed)
Improved with daily  use of extension exercises.  Not a candidate for surgery

## 2019-05-13 NOTE — Assessment & Plan Note (Signed)
Resected with prior back surgery , per review of records from [facility] .   Repeat MRI notes a 5 mm cyst on the right at L5-S1 which not responsible for patient's current pain and dysfunction per Neurosurgical consult August 2020

## 2019-05-13 NOTE — Assessment & Plan Note (Signed)
Trial of lactulose twice weekly to mange constipation which is causing drain pain."

## 2019-05-14 DIAGNOSIS — N186 End stage renal disease: Secondary | ICD-10-CM | POA: Diagnosis not present

## 2019-05-14 DIAGNOSIS — D509 Iron deficiency anemia, unspecified: Secondary | ICD-10-CM | POA: Diagnosis not present

## 2019-05-14 DIAGNOSIS — D631 Anemia in chronic kidney disease: Secondary | ICD-10-CM | POA: Diagnosis not present

## 2019-05-14 DIAGNOSIS — Z992 Dependence on renal dialysis: Secondary | ICD-10-CM | POA: Diagnosis not present

## 2019-05-15 DIAGNOSIS — D509 Iron deficiency anemia, unspecified: Secondary | ICD-10-CM | POA: Diagnosis not present

## 2019-05-15 DIAGNOSIS — Z992 Dependence on renal dialysis: Secondary | ICD-10-CM | POA: Diagnosis not present

## 2019-05-15 DIAGNOSIS — D631 Anemia in chronic kidney disease: Secondary | ICD-10-CM | POA: Diagnosis not present

## 2019-05-15 DIAGNOSIS — N186 End stage renal disease: Secondary | ICD-10-CM | POA: Diagnosis not present

## 2019-05-15 NOTE — Telephone Encounter (Signed)
Attempted to schedule. Lmov  

## 2019-05-16 DIAGNOSIS — N186 End stage renal disease: Secondary | ICD-10-CM | POA: Diagnosis not present

## 2019-05-16 DIAGNOSIS — Z992 Dependence on renal dialysis: Secondary | ICD-10-CM | POA: Diagnosis not present

## 2019-05-16 DIAGNOSIS — D631 Anemia in chronic kidney disease: Secondary | ICD-10-CM | POA: Diagnosis not present

## 2019-05-16 DIAGNOSIS — D509 Iron deficiency anemia, unspecified: Secondary | ICD-10-CM | POA: Diagnosis not present

## 2019-05-16 NOTE — Telephone Encounter (Signed)
The patient had an appointment with Dr. Saunders Revel on 12/2 & Ignacia Bayley, NP on 12/16 for BP follow up.

## 2019-05-17 DIAGNOSIS — D509 Iron deficiency anemia, unspecified: Secondary | ICD-10-CM | POA: Diagnosis not present

## 2019-05-17 DIAGNOSIS — Z992 Dependence on renal dialysis: Secondary | ICD-10-CM | POA: Diagnosis not present

## 2019-05-17 DIAGNOSIS — D631 Anemia in chronic kidney disease: Secondary | ICD-10-CM | POA: Diagnosis not present

## 2019-05-17 DIAGNOSIS — N186 End stage renal disease: Secondary | ICD-10-CM | POA: Diagnosis not present

## 2019-05-18 DIAGNOSIS — D631 Anemia in chronic kidney disease: Secondary | ICD-10-CM | POA: Diagnosis not present

## 2019-05-18 DIAGNOSIS — D509 Iron deficiency anemia, unspecified: Secondary | ICD-10-CM | POA: Diagnosis not present

## 2019-05-18 DIAGNOSIS — N186 End stage renal disease: Secondary | ICD-10-CM | POA: Diagnosis not present

## 2019-05-18 DIAGNOSIS — Z992 Dependence on renal dialysis: Secondary | ICD-10-CM | POA: Diagnosis not present

## 2019-05-19 DIAGNOSIS — D509 Iron deficiency anemia, unspecified: Secondary | ICD-10-CM | POA: Diagnosis not present

## 2019-05-19 DIAGNOSIS — D631 Anemia in chronic kidney disease: Secondary | ICD-10-CM | POA: Diagnosis not present

## 2019-05-19 DIAGNOSIS — Z992 Dependence on renal dialysis: Secondary | ICD-10-CM | POA: Diagnosis not present

## 2019-05-19 DIAGNOSIS — N186 End stage renal disease: Secondary | ICD-10-CM | POA: Diagnosis not present

## 2019-05-20 DIAGNOSIS — Z992 Dependence on renal dialysis: Secondary | ICD-10-CM | POA: Diagnosis not present

## 2019-05-20 DIAGNOSIS — N186 End stage renal disease: Secondary | ICD-10-CM | POA: Diagnosis not present

## 2019-05-20 DIAGNOSIS — D509 Iron deficiency anemia, unspecified: Secondary | ICD-10-CM | POA: Diagnosis not present

## 2019-05-20 DIAGNOSIS — D631 Anemia in chronic kidney disease: Secondary | ICD-10-CM | POA: Diagnosis not present

## 2019-05-21 DIAGNOSIS — D631 Anemia in chronic kidney disease: Secondary | ICD-10-CM | POA: Diagnosis not present

## 2019-05-21 DIAGNOSIS — Z992 Dependence on renal dialysis: Secondary | ICD-10-CM | POA: Diagnosis not present

## 2019-05-21 DIAGNOSIS — D509 Iron deficiency anemia, unspecified: Secondary | ICD-10-CM | POA: Diagnosis not present

## 2019-05-21 DIAGNOSIS — N186 End stage renal disease: Secondary | ICD-10-CM | POA: Diagnosis not present

## 2019-05-22 DIAGNOSIS — D631 Anemia in chronic kidney disease: Secondary | ICD-10-CM | POA: Diagnosis not present

## 2019-05-22 DIAGNOSIS — N186 End stage renal disease: Secondary | ICD-10-CM | POA: Diagnosis not present

## 2019-05-22 DIAGNOSIS — D509 Iron deficiency anemia, unspecified: Secondary | ICD-10-CM | POA: Diagnosis not present

## 2019-05-22 DIAGNOSIS — Z992 Dependence on renal dialysis: Secondary | ICD-10-CM | POA: Diagnosis not present

## 2019-05-23 DIAGNOSIS — D631 Anemia in chronic kidney disease: Secondary | ICD-10-CM | POA: Diagnosis not present

## 2019-05-23 DIAGNOSIS — N186 End stage renal disease: Secondary | ICD-10-CM | POA: Diagnosis not present

## 2019-05-23 DIAGNOSIS — D509 Iron deficiency anemia, unspecified: Secondary | ICD-10-CM | POA: Diagnosis not present

## 2019-05-23 DIAGNOSIS — Z992 Dependence on renal dialysis: Secondary | ICD-10-CM | POA: Diagnosis not present

## 2019-05-24 DIAGNOSIS — Z992 Dependence on renal dialysis: Secondary | ICD-10-CM | POA: Diagnosis not present

## 2019-05-24 DIAGNOSIS — D509 Iron deficiency anemia, unspecified: Secondary | ICD-10-CM | POA: Diagnosis not present

## 2019-05-24 DIAGNOSIS — N186 End stage renal disease: Secondary | ICD-10-CM | POA: Diagnosis not present

## 2019-05-24 DIAGNOSIS — D631 Anemia in chronic kidney disease: Secondary | ICD-10-CM | POA: Diagnosis not present

## 2019-05-25 DIAGNOSIS — N186 End stage renal disease: Secondary | ICD-10-CM | POA: Diagnosis not present

## 2019-05-25 DIAGNOSIS — D509 Iron deficiency anemia, unspecified: Secondary | ICD-10-CM | POA: Diagnosis not present

## 2019-05-25 DIAGNOSIS — N2581 Secondary hyperparathyroidism of renal origin: Secondary | ICD-10-CM | POA: Diagnosis not present

## 2019-05-25 DIAGNOSIS — Z992 Dependence on renal dialysis: Secondary | ICD-10-CM | POA: Diagnosis not present

## 2019-05-26 DIAGNOSIS — N186 End stage renal disease: Secondary | ICD-10-CM | POA: Diagnosis not present

## 2019-05-26 DIAGNOSIS — N2581 Secondary hyperparathyroidism of renal origin: Secondary | ICD-10-CM | POA: Diagnosis not present

## 2019-05-26 DIAGNOSIS — D509 Iron deficiency anemia, unspecified: Secondary | ICD-10-CM | POA: Diagnosis not present

## 2019-05-26 DIAGNOSIS — Z992 Dependence on renal dialysis: Secondary | ICD-10-CM | POA: Diagnosis not present

## 2019-05-27 DIAGNOSIS — N2581 Secondary hyperparathyroidism of renal origin: Secondary | ICD-10-CM | POA: Diagnosis not present

## 2019-05-27 DIAGNOSIS — N186 End stage renal disease: Secondary | ICD-10-CM | POA: Diagnosis not present

## 2019-05-27 DIAGNOSIS — D509 Iron deficiency anemia, unspecified: Secondary | ICD-10-CM | POA: Diagnosis not present

## 2019-05-27 DIAGNOSIS — Z992 Dependence on renal dialysis: Secondary | ICD-10-CM | POA: Diagnosis not present

## 2019-05-28 DIAGNOSIS — Z992 Dependence on renal dialysis: Secondary | ICD-10-CM | POA: Diagnosis not present

## 2019-05-28 DIAGNOSIS — N2581 Secondary hyperparathyroidism of renal origin: Secondary | ICD-10-CM | POA: Diagnosis not present

## 2019-05-28 DIAGNOSIS — N186 End stage renal disease: Secondary | ICD-10-CM | POA: Diagnosis not present

## 2019-05-28 DIAGNOSIS — D509 Iron deficiency anemia, unspecified: Secondary | ICD-10-CM | POA: Diagnosis not present

## 2019-05-29 DIAGNOSIS — N186 End stage renal disease: Secondary | ICD-10-CM | POA: Diagnosis not present

## 2019-05-29 DIAGNOSIS — Z992 Dependence on renal dialysis: Secondary | ICD-10-CM | POA: Diagnosis not present

## 2019-05-29 DIAGNOSIS — D509 Iron deficiency anemia, unspecified: Secondary | ICD-10-CM | POA: Diagnosis not present

## 2019-05-29 DIAGNOSIS — N2581 Secondary hyperparathyroidism of renal origin: Secondary | ICD-10-CM | POA: Diagnosis not present

## 2019-05-30 DIAGNOSIS — D509 Iron deficiency anemia, unspecified: Secondary | ICD-10-CM | POA: Diagnosis not present

## 2019-05-30 DIAGNOSIS — Z992 Dependence on renal dialysis: Secondary | ICD-10-CM | POA: Diagnosis not present

## 2019-05-30 DIAGNOSIS — N2581 Secondary hyperparathyroidism of renal origin: Secondary | ICD-10-CM | POA: Diagnosis not present

## 2019-05-30 DIAGNOSIS — N186 End stage renal disease: Secondary | ICD-10-CM | POA: Diagnosis not present

## 2019-05-31 DIAGNOSIS — D509 Iron deficiency anemia, unspecified: Secondary | ICD-10-CM | POA: Diagnosis not present

## 2019-05-31 DIAGNOSIS — N2581 Secondary hyperparathyroidism of renal origin: Secondary | ICD-10-CM | POA: Diagnosis not present

## 2019-05-31 DIAGNOSIS — N186 End stage renal disease: Secondary | ICD-10-CM | POA: Diagnosis not present

## 2019-05-31 DIAGNOSIS — Z992 Dependence on renal dialysis: Secondary | ICD-10-CM | POA: Diagnosis not present

## 2019-06-01 DIAGNOSIS — Z992 Dependence on renal dialysis: Secondary | ICD-10-CM | POA: Diagnosis not present

## 2019-06-01 DIAGNOSIS — N2581 Secondary hyperparathyroidism of renal origin: Secondary | ICD-10-CM | POA: Diagnosis not present

## 2019-06-01 DIAGNOSIS — D509 Iron deficiency anemia, unspecified: Secondary | ICD-10-CM | POA: Diagnosis not present

## 2019-06-01 DIAGNOSIS — N186 End stage renal disease: Secondary | ICD-10-CM | POA: Diagnosis not present

## 2019-06-02 DIAGNOSIS — N186 End stage renal disease: Secondary | ICD-10-CM | POA: Diagnosis not present

## 2019-06-02 DIAGNOSIS — D509 Iron deficiency anemia, unspecified: Secondary | ICD-10-CM | POA: Diagnosis not present

## 2019-06-02 DIAGNOSIS — Z992 Dependence on renal dialysis: Secondary | ICD-10-CM | POA: Diagnosis not present

## 2019-06-02 DIAGNOSIS — N2581 Secondary hyperparathyroidism of renal origin: Secondary | ICD-10-CM | POA: Diagnosis not present

## 2019-06-03 DIAGNOSIS — Z992 Dependence on renal dialysis: Secondary | ICD-10-CM | POA: Diagnosis not present

## 2019-06-03 DIAGNOSIS — D509 Iron deficiency anemia, unspecified: Secondary | ICD-10-CM | POA: Diagnosis not present

## 2019-06-03 DIAGNOSIS — N186 End stage renal disease: Secondary | ICD-10-CM | POA: Diagnosis not present

## 2019-06-03 DIAGNOSIS — N2581 Secondary hyperparathyroidism of renal origin: Secondary | ICD-10-CM | POA: Diagnosis not present

## 2019-06-04 ENCOUNTER — Other Ambulatory Visit: Payer: Self-pay | Admitting: Internal Medicine

## 2019-06-04 DIAGNOSIS — N2581 Secondary hyperparathyroidism of renal origin: Secondary | ICD-10-CM | POA: Diagnosis not present

## 2019-06-04 DIAGNOSIS — D509 Iron deficiency anemia, unspecified: Secondary | ICD-10-CM | POA: Diagnosis not present

## 2019-06-04 DIAGNOSIS — Z992 Dependence on renal dialysis: Secondary | ICD-10-CM | POA: Diagnosis not present

## 2019-06-04 DIAGNOSIS — N186 End stage renal disease: Secondary | ICD-10-CM | POA: Diagnosis not present

## 2019-06-05 DIAGNOSIS — D509 Iron deficiency anemia, unspecified: Secondary | ICD-10-CM | POA: Diagnosis not present

## 2019-06-05 DIAGNOSIS — Z992 Dependence on renal dialysis: Secondary | ICD-10-CM | POA: Diagnosis not present

## 2019-06-05 DIAGNOSIS — N186 End stage renal disease: Secondary | ICD-10-CM | POA: Diagnosis not present

## 2019-06-05 DIAGNOSIS — N2581 Secondary hyperparathyroidism of renal origin: Secondary | ICD-10-CM | POA: Diagnosis not present

## 2019-06-06 DIAGNOSIS — N186 End stage renal disease: Secondary | ICD-10-CM | POA: Diagnosis not present

## 2019-06-06 DIAGNOSIS — Z992 Dependence on renal dialysis: Secondary | ICD-10-CM | POA: Diagnosis not present

## 2019-06-06 DIAGNOSIS — D509 Iron deficiency anemia, unspecified: Secondary | ICD-10-CM | POA: Diagnosis not present

## 2019-06-06 DIAGNOSIS — N2581 Secondary hyperparathyroidism of renal origin: Secondary | ICD-10-CM | POA: Diagnosis not present

## 2019-06-07 DIAGNOSIS — N186 End stage renal disease: Secondary | ICD-10-CM | POA: Diagnosis not present

## 2019-06-07 DIAGNOSIS — N2581 Secondary hyperparathyroidism of renal origin: Secondary | ICD-10-CM | POA: Diagnosis not present

## 2019-06-07 DIAGNOSIS — Z992 Dependence on renal dialysis: Secondary | ICD-10-CM | POA: Diagnosis not present

## 2019-06-07 DIAGNOSIS — D509 Iron deficiency anemia, unspecified: Secondary | ICD-10-CM | POA: Diagnosis not present

## 2019-06-08 DIAGNOSIS — N186 End stage renal disease: Secondary | ICD-10-CM | POA: Diagnosis not present

## 2019-06-08 DIAGNOSIS — D509 Iron deficiency anemia, unspecified: Secondary | ICD-10-CM | POA: Diagnosis not present

## 2019-06-08 DIAGNOSIS — Z992 Dependence on renal dialysis: Secondary | ICD-10-CM | POA: Diagnosis not present

## 2019-06-08 DIAGNOSIS — N2581 Secondary hyperparathyroidism of renal origin: Secondary | ICD-10-CM | POA: Diagnosis not present

## 2019-06-09 DIAGNOSIS — N2581 Secondary hyperparathyroidism of renal origin: Secondary | ICD-10-CM | POA: Diagnosis not present

## 2019-06-09 DIAGNOSIS — N186 End stage renal disease: Secondary | ICD-10-CM | POA: Diagnosis not present

## 2019-06-09 DIAGNOSIS — D509 Iron deficiency anemia, unspecified: Secondary | ICD-10-CM | POA: Diagnosis not present

## 2019-06-09 DIAGNOSIS — Z992 Dependence on renal dialysis: Secondary | ICD-10-CM | POA: Diagnosis not present

## 2019-06-10 DIAGNOSIS — N2581 Secondary hyperparathyroidism of renal origin: Secondary | ICD-10-CM | POA: Diagnosis not present

## 2019-06-10 DIAGNOSIS — Z992 Dependence on renal dialysis: Secondary | ICD-10-CM | POA: Diagnosis not present

## 2019-06-10 DIAGNOSIS — D509 Iron deficiency anemia, unspecified: Secondary | ICD-10-CM | POA: Diagnosis not present

## 2019-06-10 DIAGNOSIS — N186 End stage renal disease: Secondary | ICD-10-CM | POA: Diagnosis not present

## 2019-06-11 DIAGNOSIS — D509 Iron deficiency anemia, unspecified: Secondary | ICD-10-CM | POA: Diagnosis not present

## 2019-06-11 DIAGNOSIS — Z992 Dependence on renal dialysis: Secondary | ICD-10-CM | POA: Diagnosis not present

## 2019-06-11 DIAGNOSIS — N186 End stage renal disease: Secondary | ICD-10-CM | POA: Diagnosis not present

## 2019-06-11 DIAGNOSIS — N2581 Secondary hyperparathyroidism of renal origin: Secondary | ICD-10-CM | POA: Diagnosis not present

## 2019-06-12 DIAGNOSIS — N2581 Secondary hyperparathyroidism of renal origin: Secondary | ICD-10-CM | POA: Diagnosis not present

## 2019-06-12 DIAGNOSIS — D509 Iron deficiency anemia, unspecified: Secondary | ICD-10-CM | POA: Diagnosis not present

## 2019-06-12 DIAGNOSIS — Z992 Dependence on renal dialysis: Secondary | ICD-10-CM | POA: Diagnosis not present

## 2019-06-12 DIAGNOSIS — N186 End stage renal disease: Secondary | ICD-10-CM | POA: Diagnosis not present

## 2019-06-13 DIAGNOSIS — Z992 Dependence on renal dialysis: Secondary | ICD-10-CM | POA: Diagnosis not present

## 2019-06-13 DIAGNOSIS — D509 Iron deficiency anemia, unspecified: Secondary | ICD-10-CM | POA: Diagnosis not present

## 2019-06-13 DIAGNOSIS — N2581 Secondary hyperparathyroidism of renal origin: Secondary | ICD-10-CM | POA: Diagnosis not present

## 2019-06-13 DIAGNOSIS — N186 End stage renal disease: Secondary | ICD-10-CM | POA: Diagnosis not present

## 2019-06-14 DIAGNOSIS — Z992 Dependence on renal dialysis: Secondary | ICD-10-CM | POA: Diagnosis not present

## 2019-06-14 DIAGNOSIS — N186 End stage renal disease: Secondary | ICD-10-CM | POA: Diagnosis not present

## 2019-06-14 DIAGNOSIS — D509 Iron deficiency anemia, unspecified: Secondary | ICD-10-CM | POA: Diagnosis not present

## 2019-06-14 DIAGNOSIS — N2581 Secondary hyperparathyroidism of renal origin: Secondary | ICD-10-CM | POA: Diagnosis not present

## 2019-06-15 DIAGNOSIS — N2581 Secondary hyperparathyroidism of renal origin: Secondary | ICD-10-CM | POA: Diagnosis not present

## 2019-06-15 DIAGNOSIS — Z992 Dependence on renal dialysis: Secondary | ICD-10-CM | POA: Diagnosis not present

## 2019-06-15 DIAGNOSIS — D509 Iron deficiency anemia, unspecified: Secondary | ICD-10-CM | POA: Diagnosis not present

## 2019-06-15 DIAGNOSIS — N186 End stage renal disease: Secondary | ICD-10-CM | POA: Diagnosis not present

## 2019-06-16 DIAGNOSIS — N186 End stage renal disease: Secondary | ICD-10-CM | POA: Diagnosis not present

## 2019-06-16 DIAGNOSIS — D509 Iron deficiency anemia, unspecified: Secondary | ICD-10-CM | POA: Diagnosis not present

## 2019-06-16 DIAGNOSIS — N2581 Secondary hyperparathyroidism of renal origin: Secondary | ICD-10-CM | POA: Diagnosis not present

## 2019-06-16 DIAGNOSIS — Z992 Dependence on renal dialysis: Secondary | ICD-10-CM | POA: Diagnosis not present

## 2019-06-17 DIAGNOSIS — Z992 Dependence on renal dialysis: Secondary | ICD-10-CM | POA: Diagnosis not present

## 2019-06-17 DIAGNOSIS — N186 End stage renal disease: Secondary | ICD-10-CM | POA: Diagnosis not present

## 2019-06-17 DIAGNOSIS — N2581 Secondary hyperparathyroidism of renal origin: Secondary | ICD-10-CM | POA: Diagnosis not present

## 2019-06-17 DIAGNOSIS — D509 Iron deficiency anemia, unspecified: Secondary | ICD-10-CM | POA: Diagnosis not present

## 2019-06-18 DIAGNOSIS — N186 End stage renal disease: Secondary | ICD-10-CM | POA: Diagnosis not present

## 2019-06-18 DIAGNOSIS — D509 Iron deficiency anemia, unspecified: Secondary | ICD-10-CM | POA: Diagnosis not present

## 2019-06-18 DIAGNOSIS — N2581 Secondary hyperparathyroidism of renal origin: Secondary | ICD-10-CM | POA: Diagnosis not present

## 2019-06-18 DIAGNOSIS — Z992 Dependence on renal dialysis: Secondary | ICD-10-CM | POA: Diagnosis not present

## 2019-06-19 DIAGNOSIS — N186 End stage renal disease: Secondary | ICD-10-CM | POA: Diagnosis not present

## 2019-06-19 DIAGNOSIS — Z992 Dependence on renal dialysis: Secondary | ICD-10-CM | POA: Diagnosis not present

## 2019-06-19 DIAGNOSIS — N2581 Secondary hyperparathyroidism of renal origin: Secondary | ICD-10-CM | POA: Diagnosis not present

## 2019-06-19 DIAGNOSIS — D509 Iron deficiency anemia, unspecified: Secondary | ICD-10-CM | POA: Diagnosis not present

## 2019-06-20 ENCOUNTER — Telehealth: Payer: Self-pay | Admitting: Internal Medicine

## 2019-06-20 DIAGNOSIS — D509 Iron deficiency anemia, unspecified: Secondary | ICD-10-CM | POA: Diagnosis not present

## 2019-06-20 DIAGNOSIS — Z992 Dependence on renal dialysis: Secondary | ICD-10-CM | POA: Diagnosis not present

## 2019-06-20 DIAGNOSIS — N2581 Secondary hyperparathyroidism of renal origin: Secondary | ICD-10-CM | POA: Diagnosis not present

## 2019-06-20 DIAGNOSIS — N186 End stage renal disease: Secondary | ICD-10-CM | POA: Diagnosis not present

## 2019-06-20 NOTE — Telephone Encounter (Signed)
Left message for patient to call back and schedule Medicare Annual Wellness Visit (AWV) either virtually/audio only.  Last AWV 11.18.19; please schedule at anytime with Denisa O'Brien-Blaney at [facility] for [facility] to schedule

## 2019-06-21 DIAGNOSIS — D509 Iron deficiency anemia, unspecified: Secondary | ICD-10-CM | POA: Diagnosis not present

## 2019-06-21 DIAGNOSIS — N2581 Secondary hyperparathyroidism of renal origin: Secondary | ICD-10-CM | POA: Diagnosis not present

## 2019-06-21 DIAGNOSIS — Z992 Dependence on renal dialysis: Secondary | ICD-10-CM | POA: Diagnosis not present

## 2019-06-21 DIAGNOSIS — N186 End stage renal disease: Secondary | ICD-10-CM | POA: Diagnosis not present

## 2019-06-22 DIAGNOSIS — N2581 Secondary hyperparathyroidism of renal origin: Secondary | ICD-10-CM | POA: Diagnosis not present

## 2019-06-22 DIAGNOSIS — N186 End stage renal disease: Secondary | ICD-10-CM | POA: Diagnosis not present

## 2019-06-22 DIAGNOSIS — Z992 Dependence on renal dialysis: Secondary | ICD-10-CM | POA: Diagnosis not present

## 2019-06-22 DIAGNOSIS — D509 Iron deficiency anemia, unspecified: Secondary | ICD-10-CM | POA: Diagnosis not present

## 2019-06-23 DIAGNOSIS — D509 Iron deficiency anemia, unspecified: Secondary | ICD-10-CM | POA: Diagnosis not present

## 2019-06-23 DIAGNOSIS — N2581 Secondary hyperparathyroidism of renal origin: Secondary | ICD-10-CM | POA: Diagnosis not present

## 2019-06-23 DIAGNOSIS — Z992 Dependence on renal dialysis: Secondary | ICD-10-CM | POA: Diagnosis not present

## 2019-06-23 DIAGNOSIS — N186 End stage renal disease: Secondary | ICD-10-CM | POA: Diagnosis not present

## 2019-06-24 DIAGNOSIS — N186 End stage renal disease: Secondary | ICD-10-CM | POA: Diagnosis not present

## 2019-06-24 DIAGNOSIS — N2581 Secondary hyperparathyroidism of renal origin: Secondary | ICD-10-CM | POA: Diagnosis not present

## 2019-06-24 DIAGNOSIS — Z992 Dependence on renal dialysis: Secondary | ICD-10-CM | POA: Diagnosis not present

## 2019-06-24 DIAGNOSIS — D509 Iron deficiency anemia, unspecified: Secondary | ICD-10-CM | POA: Diagnosis not present

## 2019-06-25 DIAGNOSIS — N186 End stage renal disease: Secondary | ICD-10-CM | POA: Diagnosis not present

## 2019-06-25 DIAGNOSIS — Z992 Dependence on renal dialysis: Secondary | ICD-10-CM | POA: Diagnosis not present

## 2019-06-26 DIAGNOSIS — N186 End stage renal disease: Secondary | ICD-10-CM | POA: Diagnosis not present

## 2019-06-26 DIAGNOSIS — Z992 Dependence on renal dialysis: Secondary | ICD-10-CM | POA: Diagnosis not present

## 2019-06-27 DIAGNOSIS — Z992 Dependence on renal dialysis: Secondary | ICD-10-CM | POA: Diagnosis not present

## 2019-06-27 DIAGNOSIS — N186 End stage renal disease: Secondary | ICD-10-CM | POA: Diagnosis not present

## 2019-06-28 DIAGNOSIS — N186 End stage renal disease: Secondary | ICD-10-CM | POA: Diagnosis not present

## 2019-06-28 DIAGNOSIS — Z992 Dependence on renal dialysis: Secondary | ICD-10-CM | POA: Diagnosis not present

## 2019-06-29 DIAGNOSIS — N186 End stage renal disease: Secondary | ICD-10-CM | POA: Diagnosis not present

## 2019-06-29 DIAGNOSIS — Z992 Dependence on renal dialysis: Secondary | ICD-10-CM | POA: Diagnosis not present

## 2019-06-30 DIAGNOSIS — Z992 Dependence on renal dialysis: Secondary | ICD-10-CM | POA: Diagnosis not present

## 2019-06-30 DIAGNOSIS — N186 End stage renal disease: Secondary | ICD-10-CM | POA: Diagnosis not present

## 2019-07-01 DIAGNOSIS — N186 End stage renal disease: Secondary | ICD-10-CM | POA: Diagnosis not present

## 2019-07-01 DIAGNOSIS — Z992 Dependence on renal dialysis: Secondary | ICD-10-CM | POA: Diagnosis not present

## 2019-07-02 DIAGNOSIS — Z992 Dependence on renal dialysis: Secondary | ICD-10-CM | POA: Diagnosis not present

## 2019-07-02 DIAGNOSIS — N186 End stage renal disease: Secondary | ICD-10-CM | POA: Diagnosis not present

## 2019-07-03 DIAGNOSIS — D631 Anemia in chronic kidney disease: Secondary | ICD-10-CM | POA: Diagnosis not present

## 2019-07-03 DIAGNOSIS — N186 End stage renal disease: Secondary | ICD-10-CM | POA: Diagnosis not present

## 2019-07-03 DIAGNOSIS — D509 Iron deficiency anemia, unspecified: Secondary | ICD-10-CM | POA: Diagnosis not present

## 2019-07-03 DIAGNOSIS — Z992 Dependence on renal dialysis: Secondary | ICD-10-CM | POA: Diagnosis not present

## 2019-07-04 DIAGNOSIS — Z992 Dependence on renal dialysis: Secondary | ICD-10-CM | POA: Diagnosis not present

## 2019-07-04 DIAGNOSIS — D631 Anemia in chronic kidney disease: Secondary | ICD-10-CM | POA: Diagnosis not present

## 2019-07-04 DIAGNOSIS — D509 Iron deficiency anemia, unspecified: Secondary | ICD-10-CM | POA: Diagnosis not present

## 2019-07-04 DIAGNOSIS — N186 End stage renal disease: Secondary | ICD-10-CM | POA: Diagnosis not present

## 2019-07-05 DIAGNOSIS — D631 Anemia in chronic kidney disease: Secondary | ICD-10-CM | POA: Diagnosis not present

## 2019-07-05 DIAGNOSIS — N186 End stage renal disease: Secondary | ICD-10-CM | POA: Diagnosis not present

## 2019-07-05 DIAGNOSIS — D509 Iron deficiency anemia, unspecified: Secondary | ICD-10-CM | POA: Diagnosis not present

## 2019-07-05 DIAGNOSIS — Z992 Dependence on renal dialysis: Secondary | ICD-10-CM | POA: Diagnosis not present

## 2019-07-06 DIAGNOSIS — N186 End stage renal disease: Secondary | ICD-10-CM | POA: Diagnosis not present

## 2019-07-06 DIAGNOSIS — D631 Anemia in chronic kidney disease: Secondary | ICD-10-CM | POA: Diagnosis not present

## 2019-07-06 DIAGNOSIS — Z992 Dependence on renal dialysis: Secondary | ICD-10-CM | POA: Diagnosis not present

## 2019-07-06 DIAGNOSIS — D509 Iron deficiency anemia, unspecified: Secondary | ICD-10-CM | POA: Diagnosis not present

## 2019-07-07 DIAGNOSIS — D631 Anemia in chronic kidney disease: Secondary | ICD-10-CM | POA: Diagnosis not present

## 2019-07-07 DIAGNOSIS — D509 Iron deficiency anemia, unspecified: Secondary | ICD-10-CM | POA: Diagnosis not present

## 2019-07-07 DIAGNOSIS — Z992 Dependence on renal dialysis: Secondary | ICD-10-CM | POA: Diagnosis not present

## 2019-07-07 DIAGNOSIS — N186 End stage renal disease: Secondary | ICD-10-CM | POA: Diagnosis not present

## 2019-07-08 DIAGNOSIS — D631 Anemia in chronic kidney disease: Secondary | ICD-10-CM | POA: Diagnosis not present

## 2019-07-08 DIAGNOSIS — Z992 Dependence on renal dialysis: Secondary | ICD-10-CM | POA: Diagnosis not present

## 2019-07-08 DIAGNOSIS — D509 Iron deficiency anemia, unspecified: Secondary | ICD-10-CM | POA: Diagnosis not present

## 2019-07-08 DIAGNOSIS — N186 End stage renal disease: Secondary | ICD-10-CM | POA: Diagnosis not present

## 2019-07-09 DIAGNOSIS — N186 End stage renal disease: Secondary | ICD-10-CM | POA: Diagnosis not present

## 2019-07-09 DIAGNOSIS — D631 Anemia in chronic kidney disease: Secondary | ICD-10-CM | POA: Diagnosis not present

## 2019-07-09 DIAGNOSIS — Z992 Dependence on renal dialysis: Secondary | ICD-10-CM | POA: Diagnosis not present

## 2019-07-09 DIAGNOSIS — D509 Iron deficiency anemia, unspecified: Secondary | ICD-10-CM | POA: Diagnosis not present

## 2019-07-10 DIAGNOSIS — N186 End stage renal disease: Secondary | ICD-10-CM | POA: Diagnosis not present

## 2019-07-10 DIAGNOSIS — D631 Anemia in chronic kidney disease: Secondary | ICD-10-CM | POA: Diagnosis not present

## 2019-07-10 DIAGNOSIS — Z992 Dependence on renal dialysis: Secondary | ICD-10-CM | POA: Diagnosis not present

## 2019-07-10 DIAGNOSIS — D509 Iron deficiency anemia, unspecified: Secondary | ICD-10-CM | POA: Diagnosis not present

## 2019-07-11 DIAGNOSIS — D631 Anemia in chronic kidney disease: Secondary | ICD-10-CM | POA: Diagnosis not present

## 2019-07-11 DIAGNOSIS — Z992 Dependence on renal dialysis: Secondary | ICD-10-CM | POA: Diagnosis not present

## 2019-07-11 DIAGNOSIS — N186 End stage renal disease: Secondary | ICD-10-CM | POA: Diagnosis not present

## 2019-07-11 DIAGNOSIS — D509 Iron deficiency anemia, unspecified: Secondary | ICD-10-CM | POA: Diagnosis not present

## 2019-07-12 DIAGNOSIS — Z992 Dependence on renal dialysis: Secondary | ICD-10-CM | POA: Diagnosis not present

## 2019-07-12 DIAGNOSIS — N186 End stage renal disease: Secondary | ICD-10-CM | POA: Diagnosis not present

## 2019-07-12 DIAGNOSIS — D631 Anemia in chronic kidney disease: Secondary | ICD-10-CM | POA: Diagnosis not present

## 2019-07-12 DIAGNOSIS — D509 Iron deficiency anemia, unspecified: Secondary | ICD-10-CM | POA: Diagnosis not present

## 2019-07-13 DIAGNOSIS — D631 Anemia in chronic kidney disease: Secondary | ICD-10-CM | POA: Diagnosis not present

## 2019-07-13 DIAGNOSIS — Z992 Dependence on renal dialysis: Secondary | ICD-10-CM | POA: Diagnosis not present

## 2019-07-13 DIAGNOSIS — N186 End stage renal disease: Secondary | ICD-10-CM | POA: Diagnosis not present

## 2019-07-13 DIAGNOSIS — D509 Iron deficiency anemia, unspecified: Secondary | ICD-10-CM | POA: Diagnosis not present

## 2019-07-14 DIAGNOSIS — D631 Anemia in chronic kidney disease: Secondary | ICD-10-CM | POA: Diagnosis not present

## 2019-07-14 DIAGNOSIS — D509 Iron deficiency anemia, unspecified: Secondary | ICD-10-CM | POA: Diagnosis not present

## 2019-07-14 DIAGNOSIS — Z992 Dependence on renal dialysis: Secondary | ICD-10-CM | POA: Diagnosis not present

## 2019-07-14 DIAGNOSIS — N186 End stage renal disease: Secondary | ICD-10-CM | POA: Diagnosis not present

## 2019-07-15 DIAGNOSIS — Z992 Dependence on renal dialysis: Secondary | ICD-10-CM | POA: Diagnosis not present

## 2019-07-15 DIAGNOSIS — D509 Iron deficiency anemia, unspecified: Secondary | ICD-10-CM | POA: Diagnosis not present

## 2019-07-15 DIAGNOSIS — N186 End stage renal disease: Secondary | ICD-10-CM | POA: Diagnosis not present

## 2019-07-15 DIAGNOSIS — D631 Anemia in chronic kidney disease: Secondary | ICD-10-CM | POA: Diagnosis not present

## 2019-07-16 DIAGNOSIS — Z992 Dependence on renal dialysis: Secondary | ICD-10-CM | POA: Diagnosis not present

## 2019-07-16 DIAGNOSIS — D509 Iron deficiency anemia, unspecified: Secondary | ICD-10-CM | POA: Diagnosis not present

## 2019-07-16 DIAGNOSIS — N186 End stage renal disease: Secondary | ICD-10-CM | POA: Diagnosis not present

## 2019-07-16 DIAGNOSIS — D631 Anemia in chronic kidney disease: Secondary | ICD-10-CM | POA: Diagnosis not present

## 2019-07-17 DIAGNOSIS — N186 End stage renal disease: Secondary | ICD-10-CM | POA: Diagnosis not present

## 2019-07-17 DIAGNOSIS — D509 Iron deficiency anemia, unspecified: Secondary | ICD-10-CM | POA: Diagnosis not present

## 2019-07-17 DIAGNOSIS — D631 Anemia in chronic kidney disease: Secondary | ICD-10-CM | POA: Diagnosis not present

## 2019-07-17 DIAGNOSIS — Z992 Dependence on renal dialysis: Secondary | ICD-10-CM | POA: Diagnosis not present

## 2019-07-18 DIAGNOSIS — Z992 Dependence on renal dialysis: Secondary | ICD-10-CM | POA: Diagnosis not present

## 2019-07-18 DIAGNOSIS — D631 Anemia in chronic kidney disease: Secondary | ICD-10-CM | POA: Diagnosis not present

## 2019-07-18 DIAGNOSIS — D509 Iron deficiency anemia, unspecified: Secondary | ICD-10-CM | POA: Diagnosis not present

## 2019-07-18 DIAGNOSIS — N186 End stage renal disease: Secondary | ICD-10-CM | POA: Diagnosis not present

## 2019-07-19 DIAGNOSIS — Z992 Dependence on renal dialysis: Secondary | ICD-10-CM | POA: Diagnosis not present

## 2019-07-19 DIAGNOSIS — N186 End stage renal disease: Secondary | ICD-10-CM | POA: Diagnosis not present

## 2019-07-19 DIAGNOSIS — D509 Iron deficiency anemia, unspecified: Secondary | ICD-10-CM | POA: Diagnosis not present

## 2019-07-19 DIAGNOSIS — D631 Anemia in chronic kidney disease: Secondary | ICD-10-CM | POA: Diagnosis not present

## 2019-07-20 DIAGNOSIS — Z992 Dependence on renal dialysis: Secondary | ICD-10-CM | POA: Diagnosis not present

## 2019-07-20 DIAGNOSIS — D509 Iron deficiency anemia, unspecified: Secondary | ICD-10-CM | POA: Diagnosis not present

## 2019-07-20 DIAGNOSIS — D631 Anemia in chronic kidney disease: Secondary | ICD-10-CM | POA: Diagnosis not present

## 2019-07-20 DIAGNOSIS — N186 End stage renal disease: Secondary | ICD-10-CM | POA: Diagnosis not present

## 2019-07-21 DIAGNOSIS — Z992 Dependence on renal dialysis: Secondary | ICD-10-CM | POA: Diagnosis not present

## 2019-07-21 DIAGNOSIS — D631 Anemia in chronic kidney disease: Secondary | ICD-10-CM | POA: Diagnosis not present

## 2019-07-21 DIAGNOSIS — D509 Iron deficiency anemia, unspecified: Secondary | ICD-10-CM | POA: Diagnosis not present

## 2019-07-21 DIAGNOSIS — N186 End stage renal disease: Secondary | ICD-10-CM | POA: Diagnosis not present

## 2019-07-22 DIAGNOSIS — D509 Iron deficiency anemia, unspecified: Secondary | ICD-10-CM | POA: Diagnosis not present

## 2019-07-22 DIAGNOSIS — Z992 Dependence on renal dialysis: Secondary | ICD-10-CM | POA: Diagnosis not present

## 2019-07-22 DIAGNOSIS — D631 Anemia in chronic kidney disease: Secondary | ICD-10-CM | POA: Diagnosis not present

## 2019-07-22 DIAGNOSIS — N186 End stage renal disease: Secondary | ICD-10-CM | POA: Diagnosis not present

## 2019-07-23 DIAGNOSIS — Z992 Dependence on renal dialysis: Secondary | ICD-10-CM | POA: Diagnosis not present

## 2019-07-23 DIAGNOSIS — D509 Iron deficiency anemia, unspecified: Secondary | ICD-10-CM | POA: Diagnosis not present

## 2019-07-23 DIAGNOSIS — D631 Anemia in chronic kidney disease: Secondary | ICD-10-CM | POA: Diagnosis not present

## 2019-07-23 DIAGNOSIS — N186 End stage renal disease: Secondary | ICD-10-CM | POA: Diagnosis not present

## 2019-07-24 DIAGNOSIS — Z992 Dependence on renal dialysis: Secondary | ICD-10-CM | POA: Diagnosis not present

## 2019-07-24 DIAGNOSIS — N186 End stage renal disease: Secondary | ICD-10-CM | POA: Diagnosis not present

## 2019-07-24 DIAGNOSIS — D509 Iron deficiency anemia, unspecified: Secondary | ICD-10-CM | POA: Diagnosis not present

## 2019-07-24 DIAGNOSIS — D631 Anemia in chronic kidney disease: Secondary | ICD-10-CM | POA: Diagnosis not present

## 2019-07-25 DIAGNOSIS — N186 End stage renal disease: Secondary | ICD-10-CM | POA: Diagnosis not present

## 2019-07-25 DIAGNOSIS — D631 Anemia in chronic kidney disease: Secondary | ICD-10-CM | POA: Diagnosis not present

## 2019-07-25 DIAGNOSIS — Z79899 Other long term (current) drug therapy: Secondary | ICD-10-CM | POA: Diagnosis not present

## 2019-07-25 DIAGNOSIS — D509 Iron deficiency anemia, unspecified: Secondary | ICD-10-CM | POA: Diagnosis not present

## 2019-07-25 DIAGNOSIS — Z992 Dependence on renal dialysis: Secondary | ICD-10-CM | POA: Diagnosis not present

## 2019-07-26 DIAGNOSIS — D509 Iron deficiency anemia, unspecified: Secondary | ICD-10-CM | POA: Diagnosis not present

## 2019-07-26 DIAGNOSIS — N186 End stage renal disease: Secondary | ICD-10-CM | POA: Diagnosis not present

## 2019-07-26 DIAGNOSIS — D631 Anemia in chronic kidney disease: Secondary | ICD-10-CM | POA: Diagnosis not present

## 2019-07-26 DIAGNOSIS — Z992 Dependence on renal dialysis: Secondary | ICD-10-CM | POA: Diagnosis not present

## 2019-07-27 DIAGNOSIS — D509 Iron deficiency anemia, unspecified: Secondary | ICD-10-CM | POA: Diagnosis not present

## 2019-07-27 DIAGNOSIS — N186 End stage renal disease: Secondary | ICD-10-CM | POA: Diagnosis not present

## 2019-07-27 DIAGNOSIS — D631 Anemia in chronic kidney disease: Secondary | ICD-10-CM | POA: Diagnosis not present

## 2019-07-27 DIAGNOSIS — Z992 Dependence on renal dialysis: Secondary | ICD-10-CM | POA: Diagnosis not present

## 2019-07-28 DIAGNOSIS — D509 Iron deficiency anemia, unspecified: Secondary | ICD-10-CM | POA: Diagnosis not present

## 2019-07-28 DIAGNOSIS — Z992 Dependence on renal dialysis: Secondary | ICD-10-CM | POA: Diagnosis not present

## 2019-07-28 DIAGNOSIS — N186 End stage renal disease: Secondary | ICD-10-CM | POA: Diagnosis not present

## 2019-07-28 DIAGNOSIS — D631 Anemia in chronic kidney disease: Secondary | ICD-10-CM | POA: Diagnosis not present

## 2019-07-29 DIAGNOSIS — D631 Anemia in chronic kidney disease: Secondary | ICD-10-CM | POA: Diagnosis not present

## 2019-07-29 DIAGNOSIS — Z992 Dependence on renal dialysis: Secondary | ICD-10-CM | POA: Diagnosis not present

## 2019-07-29 DIAGNOSIS — D509 Iron deficiency anemia, unspecified: Secondary | ICD-10-CM | POA: Diagnosis not present

## 2019-07-29 DIAGNOSIS — N186 End stage renal disease: Secondary | ICD-10-CM | POA: Diagnosis not present

## 2019-07-30 DIAGNOSIS — D509 Iron deficiency anemia, unspecified: Secondary | ICD-10-CM | POA: Diagnosis not present

## 2019-07-30 DIAGNOSIS — Z992 Dependence on renal dialysis: Secondary | ICD-10-CM | POA: Diagnosis not present

## 2019-07-30 DIAGNOSIS — N186 End stage renal disease: Secondary | ICD-10-CM | POA: Diagnosis not present

## 2019-07-30 DIAGNOSIS — D631 Anemia in chronic kidney disease: Secondary | ICD-10-CM | POA: Diagnosis not present

## 2019-07-31 DIAGNOSIS — N186 End stage renal disease: Secondary | ICD-10-CM | POA: Diagnosis not present

## 2019-07-31 DIAGNOSIS — Z992 Dependence on renal dialysis: Secondary | ICD-10-CM | POA: Diagnosis not present

## 2019-07-31 DIAGNOSIS — D509 Iron deficiency anemia, unspecified: Secondary | ICD-10-CM | POA: Diagnosis not present

## 2019-07-31 DIAGNOSIS — D631 Anemia in chronic kidney disease: Secondary | ICD-10-CM | POA: Diagnosis not present

## 2019-08-01 DIAGNOSIS — D631 Anemia in chronic kidney disease: Secondary | ICD-10-CM | POA: Diagnosis not present

## 2019-08-01 DIAGNOSIS — N186 End stage renal disease: Secondary | ICD-10-CM | POA: Diagnosis not present

## 2019-08-01 DIAGNOSIS — D509 Iron deficiency anemia, unspecified: Secondary | ICD-10-CM | POA: Diagnosis not present

## 2019-08-01 DIAGNOSIS — Z992 Dependence on renal dialysis: Secondary | ICD-10-CM | POA: Diagnosis not present

## 2019-08-02 DIAGNOSIS — D509 Iron deficiency anemia, unspecified: Secondary | ICD-10-CM | POA: Diagnosis not present

## 2019-08-02 DIAGNOSIS — D631 Anemia in chronic kidney disease: Secondary | ICD-10-CM | POA: Diagnosis not present

## 2019-08-02 DIAGNOSIS — N186 End stage renal disease: Secondary | ICD-10-CM | POA: Diagnosis not present

## 2019-08-02 DIAGNOSIS — Z992 Dependence on renal dialysis: Secondary | ICD-10-CM | POA: Diagnosis not present

## 2019-08-03 DIAGNOSIS — D509 Iron deficiency anemia, unspecified: Secondary | ICD-10-CM | POA: Diagnosis not present

## 2019-08-03 DIAGNOSIS — N186 End stage renal disease: Secondary | ICD-10-CM | POA: Diagnosis not present

## 2019-08-03 DIAGNOSIS — Z992 Dependence on renal dialysis: Secondary | ICD-10-CM | POA: Diagnosis not present

## 2019-08-03 DIAGNOSIS — D631 Anemia in chronic kidney disease: Secondary | ICD-10-CM | POA: Diagnosis not present

## 2019-08-04 DIAGNOSIS — Z992 Dependence on renal dialysis: Secondary | ICD-10-CM | POA: Diagnosis not present

## 2019-08-04 DIAGNOSIS — N186 End stage renal disease: Secondary | ICD-10-CM | POA: Diagnosis not present

## 2019-08-04 DIAGNOSIS — D631 Anemia in chronic kidney disease: Secondary | ICD-10-CM | POA: Diagnosis not present

## 2019-08-04 DIAGNOSIS — D509 Iron deficiency anemia, unspecified: Secondary | ICD-10-CM | POA: Diagnosis not present

## 2019-08-05 DIAGNOSIS — Z992 Dependence on renal dialysis: Secondary | ICD-10-CM | POA: Diagnosis not present

## 2019-08-05 DIAGNOSIS — D509 Iron deficiency anemia, unspecified: Secondary | ICD-10-CM | POA: Diagnosis not present

## 2019-08-05 DIAGNOSIS — D631 Anemia in chronic kidney disease: Secondary | ICD-10-CM | POA: Diagnosis not present

## 2019-08-05 DIAGNOSIS — N186 End stage renal disease: Secondary | ICD-10-CM | POA: Diagnosis not present

## 2019-08-06 DIAGNOSIS — D631 Anemia in chronic kidney disease: Secondary | ICD-10-CM | POA: Diagnosis not present

## 2019-08-06 DIAGNOSIS — Z992 Dependence on renal dialysis: Secondary | ICD-10-CM | POA: Diagnosis not present

## 2019-08-06 DIAGNOSIS — N186 End stage renal disease: Secondary | ICD-10-CM | POA: Diagnosis not present

## 2019-08-06 DIAGNOSIS — D509 Iron deficiency anemia, unspecified: Secondary | ICD-10-CM | POA: Diagnosis not present

## 2019-08-07 DIAGNOSIS — N186 End stage renal disease: Secondary | ICD-10-CM | POA: Diagnosis not present

## 2019-08-07 DIAGNOSIS — Z992 Dependence on renal dialysis: Secondary | ICD-10-CM | POA: Diagnosis not present

## 2019-08-07 DIAGNOSIS — D509 Iron deficiency anemia, unspecified: Secondary | ICD-10-CM | POA: Diagnosis not present

## 2019-08-07 DIAGNOSIS — D631 Anemia in chronic kidney disease: Secondary | ICD-10-CM | POA: Diagnosis not present

## 2019-08-08 DIAGNOSIS — D631 Anemia in chronic kidney disease: Secondary | ICD-10-CM | POA: Diagnosis not present

## 2019-08-08 DIAGNOSIS — D509 Iron deficiency anemia, unspecified: Secondary | ICD-10-CM | POA: Diagnosis not present

## 2019-08-08 DIAGNOSIS — Z992 Dependence on renal dialysis: Secondary | ICD-10-CM | POA: Diagnosis not present

## 2019-08-08 DIAGNOSIS — N186 End stage renal disease: Secondary | ICD-10-CM | POA: Diagnosis not present

## 2019-08-09 DIAGNOSIS — N186 End stage renal disease: Secondary | ICD-10-CM | POA: Diagnosis not present

## 2019-08-09 DIAGNOSIS — Z992 Dependence on renal dialysis: Secondary | ICD-10-CM | POA: Diagnosis not present

## 2019-08-09 DIAGNOSIS — D631 Anemia in chronic kidney disease: Secondary | ICD-10-CM | POA: Diagnosis not present

## 2019-08-09 DIAGNOSIS — D509 Iron deficiency anemia, unspecified: Secondary | ICD-10-CM | POA: Diagnosis not present

## 2019-08-10 DIAGNOSIS — N186 End stage renal disease: Secondary | ICD-10-CM | POA: Diagnosis not present

## 2019-08-10 DIAGNOSIS — Z992 Dependence on renal dialysis: Secondary | ICD-10-CM | POA: Diagnosis not present

## 2019-08-10 DIAGNOSIS — D509 Iron deficiency anemia, unspecified: Secondary | ICD-10-CM | POA: Diagnosis not present

## 2019-08-10 DIAGNOSIS — D631 Anemia in chronic kidney disease: Secondary | ICD-10-CM | POA: Diagnosis not present

## 2019-08-11 DIAGNOSIS — N186 End stage renal disease: Secondary | ICD-10-CM | POA: Diagnosis not present

## 2019-08-11 DIAGNOSIS — D631 Anemia in chronic kidney disease: Secondary | ICD-10-CM | POA: Diagnosis not present

## 2019-08-11 DIAGNOSIS — Z992 Dependence on renal dialysis: Secondary | ICD-10-CM | POA: Diagnosis not present

## 2019-08-11 DIAGNOSIS — D509 Iron deficiency anemia, unspecified: Secondary | ICD-10-CM | POA: Diagnosis not present

## 2019-08-12 DIAGNOSIS — D509 Iron deficiency anemia, unspecified: Secondary | ICD-10-CM | POA: Diagnosis not present

## 2019-08-12 DIAGNOSIS — D631 Anemia in chronic kidney disease: Secondary | ICD-10-CM | POA: Diagnosis not present

## 2019-08-12 DIAGNOSIS — Z992 Dependence on renal dialysis: Secondary | ICD-10-CM | POA: Diagnosis not present

## 2019-08-12 DIAGNOSIS — N186 End stage renal disease: Secondary | ICD-10-CM | POA: Diagnosis not present

## 2019-08-13 DIAGNOSIS — D631 Anemia in chronic kidney disease: Secondary | ICD-10-CM | POA: Diagnosis not present

## 2019-08-13 DIAGNOSIS — D509 Iron deficiency anemia, unspecified: Secondary | ICD-10-CM | POA: Diagnosis not present

## 2019-08-13 DIAGNOSIS — Z992 Dependence on renal dialysis: Secondary | ICD-10-CM | POA: Diagnosis not present

## 2019-08-13 DIAGNOSIS — N186 End stage renal disease: Secondary | ICD-10-CM | POA: Diagnosis not present

## 2019-08-14 DIAGNOSIS — D631 Anemia in chronic kidney disease: Secondary | ICD-10-CM | POA: Diagnosis not present

## 2019-08-14 DIAGNOSIS — Z992 Dependence on renal dialysis: Secondary | ICD-10-CM | POA: Diagnosis not present

## 2019-08-14 DIAGNOSIS — N186 End stage renal disease: Secondary | ICD-10-CM | POA: Diagnosis not present

## 2019-08-14 DIAGNOSIS — D509 Iron deficiency anemia, unspecified: Secondary | ICD-10-CM | POA: Diagnosis not present

## 2019-08-15 DIAGNOSIS — Z992 Dependence on renal dialysis: Secondary | ICD-10-CM | POA: Diagnosis not present

## 2019-08-15 DIAGNOSIS — D509 Iron deficiency anemia, unspecified: Secondary | ICD-10-CM | POA: Diagnosis not present

## 2019-08-15 DIAGNOSIS — N186 End stage renal disease: Secondary | ICD-10-CM | POA: Diagnosis not present

## 2019-08-15 DIAGNOSIS — D631 Anemia in chronic kidney disease: Secondary | ICD-10-CM | POA: Diagnosis not present

## 2019-08-16 DIAGNOSIS — N186 End stage renal disease: Secondary | ICD-10-CM | POA: Diagnosis not present

## 2019-08-16 DIAGNOSIS — D631 Anemia in chronic kidney disease: Secondary | ICD-10-CM | POA: Diagnosis not present

## 2019-08-16 DIAGNOSIS — D509 Iron deficiency anemia, unspecified: Secondary | ICD-10-CM | POA: Diagnosis not present

## 2019-08-16 DIAGNOSIS — Z992 Dependence on renal dialysis: Secondary | ICD-10-CM | POA: Diagnosis not present

## 2019-08-17 DIAGNOSIS — N186 End stage renal disease: Secondary | ICD-10-CM | POA: Diagnosis not present

## 2019-08-17 DIAGNOSIS — Z992 Dependence on renal dialysis: Secondary | ICD-10-CM | POA: Diagnosis not present

## 2019-08-17 DIAGNOSIS — D509 Iron deficiency anemia, unspecified: Secondary | ICD-10-CM | POA: Diagnosis not present

## 2019-08-17 DIAGNOSIS — D631 Anemia in chronic kidney disease: Secondary | ICD-10-CM | POA: Diagnosis not present

## 2019-08-18 DIAGNOSIS — D631 Anemia in chronic kidney disease: Secondary | ICD-10-CM | POA: Diagnosis not present

## 2019-08-18 DIAGNOSIS — N186 End stage renal disease: Secondary | ICD-10-CM | POA: Diagnosis not present

## 2019-08-18 DIAGNOSIS — D509 Iron deficiency anemia, unspecified: Secondary | ICD-10-CM | POA: Diagnosis not present

## 2019-08-18 DIAGNOSIS — Z992 Dependence on renal dialysis: Secondary | ICD-10-CM | POA: Diagnosis not present

## 2019-08-19 DIAGNOSIS — Z992 Dependence on renal dialysis: Secondary | ICD-10-CM | POA: Diagnosis not present

## 2019-08-19 DIAGNOSIS — D509 Iron deficiency anemia, unspecified: Secondary | ICD-10-CM | POA: Diagnosis not present

## 2019-08-19 DIAGNOSIS — N186 End stage renal disease: Secondary | ICD-10-CM | POA: Diagnosis not present

## 2019-08-19 DIAGNOSIS — D631 Anemia in chronic kidney disease: Secondary | ICD-10-CM | POA: Diagnosis not present

## 2019-08-20 DIAGNOSIS — Z992 Dependence on renal dialysis: Secondary | ICD-10-CM | POA: Diagnosis not present

## 2019-08-20 DIAGNOSIS — D631 Anemia in chronic kidney disease: Secondary | ICD-10-CM | POA: Diagnosis not present

## 2019-08-20 DIAGNOSIS — N186 End stage renal disease: Secondary | ICD-10-CM | POA: Diagnosis not present

## 2019-08-20 DIAGNOSIS — D509 Iron deficiency anemia, unspecified: Secondary | ICD-10-CM | POA: Diagnosis not present

## 2019-08-21 DIAGNOSIS — D509 Iron deficiency anemia, unspecified: Secondary | ICD-10-CM | POA: Diagnosis not present

## 2019-08-21 DIAGNOSIS — N186 End stage renal disease: Secondary | ICD-10-CM | POA: Diagnosis not present

## 2019-08-21 DIAGNOSIS — Z992 Dependence on renal dialysis: Secondary | ICD-10-CM | POA: Diagnosis not present

## 2019-08-21 DIAGNOSIS — D631 Anemia in chronic kidney disease: Secondary | ICD-10-CM | POA: Diagnosis not present

## 2019-08-22 DIAGNOSIS — N186 End stage renal disease: Secondary | ICD-10-CM | POA: Diagnosis not present

## 2019-08-22 DIAGNOSIS — D509 Iron deficiency anemia, unspecified: Secondary | ICD-10-CM | POA: Diagnosis not present

## 2019-08-22 DIAGNOSIS — Z992 Dependence on renal dialysis: Secondary | ICD-10-CM | POA: Diagnosis not present

## 2019-08-22 DIAGNOSIS — D631 Anemia in chronic kidney disease: Secondary | ICD-10-CM | POA: Diagnosis not present

## 2019-08-23 DIAGNOSIS — N2581 Secondary hyperparathyroidism of renal origin: Secondary | ICD-10-CM | POA: Diagnosis not present

## 2019-08-23 DIAGNOSIS — N186 End stage renal disease: Secondary | ICD-10-CM | POA: Diagnosis not present

## 2019-08-23 DIAGNOSIS — D631 Anemia in chronic kidney disease: Secondary | ICD-10-CM | POA: Diagnosis not present

## 2019-08-23 DIAGNOSIS — Z992 Dependence on renal dialysis: Secondary | ICD-10-CM | POA: Diagnosis not present

## 2019-08-23 DIAGNOSIS — D509 Iron deficiency anemia, unspecified: Secondary | ICD-10-CM | POA: Diagnosis not present

## 2019-08-24 DIAGNOSIS — D509 Iron deficiency anemia, unspecified: Secondary | ICD-10-CM | POA: Diagnosis not present

## 2019-08-24 DIAGNOSIS — D631 Anemia in chronic kidney disease: Secondary | ICD-10-CM | POA: Diagnosis not present

## 2019-08-24 DIAGNOSIS — N2581 Secondary hyperparathyroidism of renal origin: Secondary | ICD-10-CM | POA: Diagnosis not present

## 2019-08-24 DIAGNOSIS — N186 End stage renal disease: Secondary | ICD-10-CM | POA: Diagnosis not present

## 2019-08-24 DIAGNOSIS — Z992 Dependence on renal dialysis: Secondary | ICD-10-CM | POA: Diagnosis not present

## 2019-08-25 DIAGNOSIS — D631 Anemia in chronic kidney disease: Secondary | ICD-10-CM | POA: Diagnosis not present

## 2019-08-25 DIAGNOSIS — N186 End stage renal disease: Secondary | ICD-10-CM | POA: Diagnosis not present

## 2019-08-25 DIAGNOSIS — D509 Iron deficiency anemia, unspecified: Secondary | ICD-10-CM | POA: Diagnosis not present

## 2019-08-25 DIAGNOSIS — N2581 Secondary hyperparathyroidism of renal origin: Secondary | ICD-10-CM | POA: Diagnosis not present

## 2019-08-25 DIAGNOSIS — Z992 Dependence on renal dialysis: Secondary | ICD-10-CM | POA: Diagnosis not present

## 2019-08-26 DIAGNOSIS — N2581 Secondary hyperparathyroidism of renal origin: Secondary | ICD-10-CM | POA: Diagnosis not present

## 2019-08-26 DIAGNOSIS — D509 Iron deficiency anemia, unspecified: Secondary | ICD-10-CM | POA: Diagnosis not present

## 2019-08-26 DIAGNOSIS — N186 End stage renal disease: Secondary | ICD-10-CM | POA: Diagnosis not present

## 2019-08-26 DIAGNOSIS — Z992 Dependence on renal dialysis: Secondary | ICD-10-CM | POA: Diagnosis not present

## 2019-08-26 DIAGNOSIS — D631 Anemia in chronic kidney disease: Secondary | ICD-10-CM | POA: Diagnosis not present

## 2019-08-27 DIAGNOSIS — Z992 Dependence on renal dialysis: Secondary | ICD-10-CM | POA: Diagnosis not present

## 2019-08-27 DIAGNOSIS — D631 Anemia in chronic kidney disease: Secondary | ICD-10-CM | POA: Diagnosis not present

## 2019-08-27 DIAGNOSIS — N2581 Secondary hyperparathyroidism of renal origin: Secondary | ICD-10-CM | POA: Diagnosis not present

## 2019-08-27 DIAGNOSIS — N186 End stage renal disease: Secondary | ICD-10-CM | POA: Diagnosis not present

## 2019-08-27 DIAGNOSIS — D509 Iron deficiency anemia, unspecified: Secondary | ICD-10-CM | POA: Diagnosis not present

## 2019-08-28 DIAGNOSIS — N186 End stage renal disease: Secondary | ICD-10-CM | POA: Diagnosis not present

## 2019-08-28 DIAGNOSIS — D631 Anemia in chronic kidney disease: Secondary | ICD-10-CM | POA: Diagnosis not present

## 2019-08-28 DIAGNOSIS — Z992 Dependence on renal dialysis: Secondary | ICD-10-CM | POA: Diagnosis not present

## 2019-08-28 DIAGNOSIS — E119 Type 2 diabetes mellitus without complications: Secondary | ICD-10-CM | POA: Diagnosis not present

## 2019-08-28 DIAGNOSIS — N2581 Secondary hyperparathyroidism of renal origin: Secondary | ICD-10-CM | POA: Diagnosis not present

## 2019-08-28 DIAGNOSIS — D509 Iron deficiency anemia, unspecified: Secondary | ICD-10-CM | POA: Diagnosis not present

## 2019-08-29 DIAGNOSIS — Z992 Dependence on renal dialysis: Secondary | ICD-10-CM | POA: Diagnosis not present

## 2019-08-29 DIAGNOSIS — D631 Anemia in chronic kidney disease: Secondary | ICD-10-CM | POA: Diagnosis not present

## 2019-08-29 DIAGNOSIS — N186 End stage renal disease: Secondary | ICD-10-CM | POA: Diagnosis not present

## 2019-08-29 DIAGNOSIS — D509 Iron deficiency anemia, unspecified: Secondary | ICD-10-CM | POA: Diagnosis not present

## 2019-08-29 DIAGNOSIS — N2581 Secondary hyperparathyroidism of renal origin: Secondary | ICD-10-CM | POA: Diagnosis not present

## 2019-08-30 DIAGNOSIS — D509 Iron deficiency anemia, unspecified: Secondary | ICD-10-CM | POA: Diagnosis not present

## 2019-08-30 DIAGNOSIS — N2581 Secondary hyperparathyroidism of renal origin: Secondary | ICD-10-CM | POA: Diagnosis not present

## 2019-08-30 DIAGNOSIS — N186 End stage renal disease: Secondary | ICD-10-CM | POA: Diagnosis not present

## 2019-08-30 DIAGNOSIS — D631 Anemia in chronic kidney disease: Secondary | ICD-10-CM | POA: Diagnosis not present

## 2019-08-30 DIAGNOSIS — Z992 Dependence on renal dialysis: Secondary | ICD-10-CM | POA: Diagnosis not present

## 2019-08-31 DIAGNOSIS — Z992 Dependence on renal dialysis: Secondary | ICD-10-CM | POA: Diagnosis not present

## 2019-08-31 DIAGNOSIS — D631 Anemia in chronic kidney disease: Secondary | ICD-10-CM | POA: Diagnosis not present

## 2019-08-31 DIAGNOSIS — N2581 Secondary hyperparathyroidism of renal origin: Secondary | ICD-10-CM | POA: Diagnosis not present

## 2019-08-31 DIAGNOSIS — N186 End stage renal disease: Secondary | ICD-10-CM | POA: Diagnosis not present

## 2019-08-31 DIAGNOSIS — D509 Iron deficiency anemia, unspecified: Secondary | ICD-10-CM | POA: Diagnosis not present

## 2019-09-01 DIAGNOSIS — N186 End stage renal disease: Secondary | ICD-10-CM | POA: Diagnosis not present

## 2019-09-01 DIAGNOSIS — Z992 Dependence on renal dialysis: Secondary | ICD-10-CM | POA: Diagnosis not present

## 2019-09-01 DIAGNOSIS — D509 Iron deficiency anemia, unspecified: Secondary | ICD-10-CM | POA: Diagnosis not present

## 2019-09-01 DIAGNOSIS — D631 Anemia in chronic kidney disease: Secondary | ICD-10-CM | POA: Diagnosis not present

## 2019-09-01 DIAGNOSIS — N2581 Secondary hyperparathyroidism of renal origin: Secondary | ICD-10-CM | POA: Diagnosis not present

## 2019-09-02 DIAGNOSIS — Z992 Dependence on renal dialysis: Secondary | ICD-10-CM | POA: Diagnosis not present

## 2019-09-02 DIAGNOSIS — D509 Iron deficiency anemia, unspecified: Secondary | ICD-10-CM | POA: Diagnosis not present

## 2019-09-02 DIAGNOSIS — D631 Anemia in chronic kidney disease: Secondary | ICD-10-CM | POA: Diagnosis not present

## 2019-09-02 DIAGNOSIS — N186 End stage renal disease: Secondary | ICD-10-CM | POA: Diagnosis not present

## 2019-09-02 DIAGNOSIS — N2581 Secondary hyperparathyroidism of renal origin: Secondary | ICD-10-CM | POA: Diagnosis not present

## 2019-09-03 DIAGNOSIS — D509 Iron deficiency anemia, unspecified: Secondary | ICD-10-CM | POA: Diagnosis not present

## 2019-09-03 DIAGNOSIS — D631 Anemia in chronic kidney disease: Secondary | ICD-10-CM | POA: Diagnosis not present

## 2019-09-03 DIAGNOSIS — N2581 Secondary hyperparathyroidism of renal origin: Secondary | ICD-10-CM | POA: Diagnosis not present

## 2019-09-03 DIAGNOSIS — Z992 Dependence on renal dialysis: Secondary | ICD-10-CM | POA: Diagnosis not present

## 2019-09-03 DIAGNOSIS — N186 End stage renal disease: Secondary | ICD-10-CM | POA: Diagnosis not present

## 2019-09-04 DIAGNOSIS — N2581 Secondary hyperparathyroidism of renal origin: Secondary | ICD-10-CM | POA: Diagnosis not present

## 2019-09-04 DIAGNOSIS — Z992 Dependence on renal dialysis: Secondary | ICD-10-CM | POA: Diagnosis not present

## 2019-09-04 DIAGNOSIS — D631 Anemia in chronic kidney disease: Secondary | ICD-10-CM | POA: Diagnosis not present

## 2019-09-04 DIAGNOSIS — N186 End stage renal disease: Secondary | ICD-10-CM | POA: Diagnosis not present

## 2019-09-04 DIAGNOSIS — D509 Iron deficiency anemia, unspecified: Secondary | ICD-10-CM | POA: Diagnosis not present

## 2019-09-05 DIAGNOSIS — Z992 Dependence on renal dialysis: Secondary | ICD-10-CM | POA: Diagnosis not present

## 2019-09-05 DIAGNOSIS — N186 End stage renal disease: Secondary | ICD-10-CM | POA: Diagnosis not present

## 2019-09-05 DIAGNOSIS — D631 Anemia in chronic kidney disease: Secondary | ICD-10-CM | POA: Diagnosis not present

## 2019-09-05 DIAGNOSIS — D509 Iron deficiency anemia, unspecified: Secondary | ICD-10-CM | POA: Diagnosis not present

## 2019-09-05 DIAGNOSIS — N2581 Secondary hyperparathyroidism of renal origin: Secondary | ICD-10-CM | POA: Diagnosis not present

## 2019-09-06 DIAGNOSIS — Z992 Dependence on renal dialysis: Secondary | ICD-10-CM | POA: Diagnosis not present

## 2019-09-06 DIAGNOSIS — N186 End stage renal disease: Secondary | ICD-10-CM | POA: Diagnosis not present

## 2019-09-06 DIAGNOSIS — D631 Anemia in chronic kidney disease: Secondary | ICD-10-CM | POA: Diagnosis not present

## 2019-09-06 DIAGNOSIS — D509 Iron deficiency anemia, unspecified: Secondary | ICD-10-CM | POA: Diagnosis not present

## 2019-09-06 DIAGNOSIS — N2581 Secondary hyperparathyroidism of renal origin: Secondary | ICD-10-CM | POA: Diagnosis not present

## 2019-09-07 DIAGNOSIS — N186 End stage renal disease: Secondary | ICD-10-CM | POA: Diagnosis not present

## 2019-09-07 DIAGNOSIS — D631 Anemia in chronic kidney disease: Secondary | ICD-10-CM | POA: Diagnosis not present

## 2019-09-07 DIAGNOSIS — D509 Iron deficiency anemia, unspecified: Secondary | ICD-10-CM | POA: Diagnosis not present

## 2019-09-07 DIAGNOSIS — Z992 Dependence on renal dialysis: Secondary | ICD-10-CM | POA: Diagnosis not present

## 2019-09-07 DIAGNOSIS — N2581 Secondary hyperparathyroidism of renal origin: Secondary | ICD-10-CM | POA: Diagnosis not present

## 2019-09-08 DIAGNOSIS — D631 Anemia in chronic kidney disease: Secondary | ICD-10-CM | POA: Diagnosis not present

## 2019-09-08 DIAGNOSIS — Z992 Dependence on renal dialysis: Secondary | ICD-10-CM | POA: Diagnosis not present

## 2019-09-08 DIAGNOSIS — N186 End stage renal disease: Secondary | ICD-10-CM | POA: Diagnosis not present

## 2019-09-08 DIAGNOSIS — D509 Iron deficiency anemia, unspecified: Secondary | ICD-10-CM | POA: Diagnosis not present

## 2019-09-08 DIAGNOSIS — N2581 Secondary hyperparathyroidism of renal origin: Secondary | ICD-10-CM | POA: Diagnosis not present

## 2019-09-09 DIAGNOSIS — D631 Anemia in chronic kidney disease: Secondary | ICD-10-CM | POA: Diagnosis not present

## 2019-09-09 DIAGNOSIS — D509 Iron deficiency anemia, unspecified: Secondary | ICD-10-CM | POA: Diagnosis not present

## 2019-09-09 DIAGNOSIS — N2581 Secondary hyperparathyroidism of renal origin: Secondary | ICD-10-CM | POA: Diagnosis not present

## 2019-09-09 DIAGNOSIS — N186 End stage renal disease: Secondary | ICD-10-CM | POA: Diagnosis not present

## 2019-09-09 DIAGNOSIS — Z992 Dependence on renal dialysis: Secondary | ICD-10-CM | POA: Diagnosis not present

## 2019-09-10 DIAGNOSIS — Z992 Dependence on renal dialysis: Secondary | ICD-10-CM | POA: Diagnosis not present

## 2019-09-10 DIAGNOSIS — N186 End stage renal disease: Secondary | ICD-10-CM | POA: Diagnosis not present

## 2019-09-10 DIAGNOSIS — D631 Anemia in chronic kidney disease: Secondary | ICD-10-CM | POA: Diagnosis not present

## 2019-09-10 DIAGNOSIS — N2581 Secondary hyperparathyroidism of renal origin: Secondary | ICD-10-CM | POA: Diagnosis not present

## 2019-09-10 DIAGNOSIS — D509 Iron deficiency anemia, unspecified: Secondary | ICD-10-CM | POA: Diagnosis not present

## 2019-09-11 DIAGNOSIS — D509 Iron deficiency anemia, unspecified: Secondary | ICD-10-CM | POA: Diagnosis not present

## 2019-09-11 DIAGNOSIS — Z992 Dependence on renal dialysis: Secondary | ICD-10-CM | POA: Diagnosis not present

## 2019-09-11 DIAGNOSIS — D631 Anemia in chronic kidney disease: Secondary | ICD-10-CM | POA: Diagnosis not present

## 2019-09-11 DIAGNOSIS — N186 End stage renal disease: Secondary | ICD-10-CM | POA: Diagnosis not present

## 2019-09-11 DIAGNOSIS — N2581 Secondary hyperparathyroidism of renal origin: Secondary | ICD-10-CM | POA: Diagnosis not present

## 2019-09-12 DIAGNOSIS — D509 Iron deficiency anemia, unspecified: Secondary | ICD-10-CM | POA: Diagnosis not present

## 2019-09-12 DIAGNOSIS — N186 End stage renal disease: Secondary | ICD-10-CM | POA: Diagnosis not present

## 2019-09-12 DIAGNOSIS — N2581 Secondary hyperparathyroidism of renal origin: Secondary | ICD-10-CM | POA: Diagnosis not present

## 2019-09-12 DIAGNOSIS — Z992 Dependence on renal dialysis: Secondary | ICD-10-CM | POA: Diagnosis not present

## 2019-09-12 DIAGNOSIS — D631 Anemia in chronic kidney disease: Secondary | ICD-10-CM | POA: Diagnosis not present

## 2019-09-13 DIAGNOSIS — N2581 Secondary hyperparathyroidism of renal origin: Secondary | ICD-10-CM | POA: Diagnosis not present

## 2019-09-13 DIAGNOSIS — D631 Anemia in chronic kidney disease: Secondary | ICD-10-CM | POA: Diagnosis not present

## 2019-09-13 DIAGNOSIS — D509 Iron deficiency anemia, unspecified: Secondary | ICD-10-CM | POA: Diagnosis not present

## 2019-09-13 DIAGNOSIS — Z992 Dependence on renal dialysis: Secondary | ICD-10-CM | POA: Diagnosis not present

## 2019-09-13 DIAGNOSIS — N186 End stage renal disease: Secondary | ICD-10-CM | POA: Diagnosis not present

## 2019-09-14 DIAGNOSIS — D631 Anemia in chronic kidney disease: Secondary | ICD-10-CM | POA: Diagnosis not present

## 2019-09-14 DIAGNOSIS — D509 Iron deficiency anemia, unspecified: Secondary | ICD-10-CM | POA: Diagnosis not present

## 2019-09-14 DIAGNOSIS — N186 End stage renal disease: Secondary | ICD-10-CM | POA: Diagnosis not present

## 2019-09-14 DIAGNOSIS — N2581 Secondary hyperparathyroidism of renal origin: Secondary | ICD-10-CM | POA: Diagnosis not present

## 2019-09-14 DIAGNOSIS — Z992 Dependence on renal dialysis: Secondary | ICD-10-CM | POA: Diagnosis not present

## 2019-09-15 DIAGNOSIS — N2581 Secondary hyperparathyroidism of renal origin: Secondary | ICD-10-CM | POA: Diagnosis not present

## 2019-09-15 DIAGNOSIS — D631 Anemia in chronic kidney disease: Secondary | ICD-10-CM | POA: Diagnosis not present

## 2019-09-15 DIAGNOSIS — N186 End stage renal disease: Secondary | ICD-10-CM | POA: Diagnosis not present

## 2019-09-15 DIAGNOSIS — D509 Iron deficiency anemia, unspecified: Secondary | ICD-10-CM | POA: Diagnosis not present

## 2019-09-15 DIAGNOSIS — Z992 Dependence on renal dialysis: Secondary | ICD-10-CM | POA: Diagnosis not present

## 2019-09-16 DIAGNOSIS — Z992 Dependence on renal dialysis: Secondary | ICD-10-CM | POA: Diagnosis not present

## 2019-09-16 DIAGNOSIS — D631 Anemia in chronic kidney disease: Secondary | ICD-10-CM | POA: Diagnosis not present

## 2019-09-16 DIAGNOSIS — N2581 Secondary hyperparathyroidism of renal origin: Secondary | ICD-10-CM | POA: Diagnosis not present

## 2019-09-16 DIAGNOSIS — D509 Iron deficiency anemia, unspecified: Secondary | ICD-10-CM | POA: Diagnosis not present

## 2019-09-16 DIAGNOSIS — N186 End stage renal disease: Secondary | ICD-10-CM | POA: Diagnosis not present

## 2019-09-17 DIAGNOSIS — N186 End stage renal disease: Secondary | ICD-10-CM | POA: Diagnosis not present

## 2019-09-17 DIAGNOSIS — Z992 Dependence on renal dialysis: Secondary | ICD-10-CM | POA: Diagnosis not present

## 2019-09-17 DIAGNOSIS — D631 Anemia in chronic kidney disease: Secondary | ICD-10-CM | POA: Diagnosis not present

## 2019-09-17 DIAGNOSIS — D509 Iron deficiency anemia, unspecified: Secondary | ICD-10-CM | POA: Diagnosis not present

## 2019-09-17 DIAGNOSIS — N2581 Secondary hyperparathyroidism of renal origin: Secondary | ICD-10-CM | POA: Diagnosis not present

## 2019-09-18 DIAGNOSIS — N186 End stage renal disease: Secondary | ICD-10-CM | POA: Diagnosis not present

## 2019-09-18 DIAGNOSIS — Z992 Dependence on renal dialysis: Secondary | ICD-10-CM | POA: Diagnosis not present

## 2019-09-18 DIAGNOSIS — D631 Anemia in chronic kidney disease: Secondary | ICD-10-CM | POA: Diagnosis not present

## 2019-09-18 DIAGNOSIS — N2581 Secondary hyperparathyroidism of renal origin: Secondary | ICD-10-CM | POA: Diagnosis not present

## 2019-09-18 DIAGNOSIS — D509 Iron deficiency anemia, unspecified: Secondary | ICD-10-CM | POA: Diagnosis not present

## 2019-09-19 DIAGNOSIS — N186 End stage renal disease: Secondary | ICD-10-CM | POA: Diagnosis not present

## 2019-09-19 DIAGNOSIS — Z992 Dependence on renal dialysis: Secondary | ICD-10-CM | POA: Diagnosis not present

## 2019-09-19 DIAGNOSIS — D509 Iron deficiency anemia, unspecified: Secondary | ICD-10-CM | POA: Diagnosis not present

## 2019-09-19 DIAGNOSIS — N2581 Secondary hyperparathyroidism of renal origin: Secondary | ICD-10-CM | POA: Diagnosis not present

## 2019-09-19 DIAGNOSIS — D631 Anemia in chronic kidney disease: Secondary | ICD-10-CM | POA: Diagnosis not present

## 2019-09-20 DIAGNOSIS — Z992 Dependence on renal dialysis: Secondary | ICD-10-CM | POA: Diagnosis not present

## 2019-09-20 DIAGNOSIS — N2581 Secondary hyperparathyroidism of renal origin: Secondary | ICD-10-CM | POA: Diagnosis not present

## 2019-09-20 DIAGNOSIS — D509 Iron deficiency anemia, unspecified: Secondary | ICD-10-CM | POA: Diagnosis not present

## 2019-09-20 DIAGNOSIS — D631 Anemia in chronic kidney disease: Secondary | ICD-10-CM | POA: Diagnosis not present

## 2019-09-20 DIAGNOSIS — N186 End stage renal disease: Secondary | ICD-10-CM | POA: Diagnosis not present

## 2019-09-21 DIAGNOSIS — D509 Iron deficiency anemia, unspecified: Secondary | ICD-10-CM | POA: Diagnosis not present

## 2019-09-21 DIAGNOSIS — N2581 Secondary hyperparathyroidism of renal origin: Secondary | ICD-10-CM | POA: Diagnosis not present

## 2019-09-21 DIAGNOSIS — Z992 Dependence on renal dialysis: Secondary | ICD-10-CM | POA: Diagnosis not present

## 2019-09-21 DIAGNOSIS — N186 End stage renal disease: Secondary | ICD-10-CM | POA: Diagnosis not present

## 2019-09-21 DIAGNOSIS — D631 Anemia in chronic kidney disease: Secondary | ICD-10-CM | POA: Diagnosis not present

## 2019-09-22 DIAGNOSIS — N186 End stage renal disease: Secondary | ICD-10-CM | POA: Diagnosis not present

## 2019-09-22 DIAGNOSIS — Z992 Dependence on renal dialysis: Secondary | ICD-10-CM | POA: Diagnosis not present

## 2019-09-22 DIAGNOSIS — D631 Anemia in chronic kidney disease: Secondary | ICD-10-CM | POA: Diagnosis not present

## 2019-09-22 DIAGNOSIS — D509 Iron deficiency anemia, unspecified: Secondary | ICD-10-CM | POA: Diagnosis not present

## 2019-09-23 DIAGNOSIS — D509 Iron deficiency anemia, unspecified: Secondary | ICD-10-CM | POA: Diagnosis not present

## 2019-09-23 DIAGNOSIS — D631 Anemia in chronic kidney disease: Secondary | ICD-10-CM | POA: Diagnosis not present

## 2019-09-23 DIAGNOSIS — N186 End stage renal disease: Secondary | ICD-10-CM | POA: Diagnosis not present

## 2019-09-23 DIAGNOSIS — Z992 Dependence on renal dialysis: Secondary | ICD-10-CM | POA: Diagnosis not present

## 2019-09-24 DIAGNOSIS — N186 End stage renal disease: Secondary | ICD-10-CM | POA: Diagnosis not present

## 2019-09-24 DIAGNOSIS — Z992 Dependence on renal dialysis: Secondary | ICD-10-CM | POA: Diagnosis not present

## 2019-09-24 DIAGNOSIS — D509 Iron deficiency anemia, unspecified: Secondary | ICD-10-CM | POA: Diagnosis not present

## 2019-09-24 DIAGNOSIS — D631 Anemia in chronic kidney disease: Secondary | ICD-10-CM | POA: Diagnosis not present

## 2019-09-25 DIAGNOSIS — N186 End stage renal disease: Secondary | ICD-10-CM | POA: Diagnosis not present

## 2019-09-25 DIAGNOSIS — D509 Iron deficiency anemia, unspecified: Secondary | ICD-10-CM | POA: Diagnosis not present

## 2019-09-25 DIAGNOSIS — Z992 Dependence on renal dialysis: Secondary | ICD-10-CM | POA: Diagnosis not present

## 2019-09-25 DIAGNOSIS — D631 Anemia in chronic kidney disease: Secondary | ICD-10-CM | POA: Diagnosis not present

## 2019-09-26 DIAGNOSIS — N186 End stage renal disease: Secondary | ICD-10-CM | POA: Diagnosis not present

## 2019-09-26 DIAGNOSIS — D509 Iron deficiency anemia, unspecified: Secondary | ICD-10-CM | POA: Diagnosis not present

## 2019-09-26 DIAGNOSIS — D631 Anemia in chronic kidney disease: Secondary | ICD-10-CM | POA: Diagnosis not present

## 2019-09-26 DIAGNOSIS — Z992 Dependence on renal dialysis: Secondary | ICD-10-CM | POA: Diagnosis not present

## 2019-09-27 DIAGNOSIS — D509 Iron deficiency anemia, unspecified: Secondary | ICD-10-CM | POA: Diagnosis not present

## 2019-09-27 DIAGNOSIS — D631 Anemia in chronic kidney disease: Secondary | ICD-10-CM | POA: Diagnosis not present

## 2019-09-27 DIAGNOSIS — Z992 Dependence on renal dialysis: Secondary | ICD-10-CM | POA: Diagnosis not present

## 2019-09-27 DIAGNOSIS — N186 End stage renal disease: Secondary | ICD-10-CM | POA: Diagnosis not present

## 2019-09-28 DIAGNOSIS — D509 Iron deficiency anemia, unspecified: Secondary | ICD-10-CM | POA: Diagnosis not present

## 2019-09-28 DIAGNOSIS — Z992 Dependence on renal dialysis: Secondary | ICD-10-CM | POA: Diagnosis not present

## 2019-09-28 DIAGNOSIS — D631 Anemia in chronic kidney disease: Secondary | ICD-10-CM | POA: Diagnosis not present

## 2019-09-28 DIAGNOSIS — N186 End stage renal disease: Secondary | ICD-10-CM | POA: Diagnosis not present

## 2019-09-29 DIAGNOSIS — D509 Iron deficiency anemia, unspecified: Secondary | ICD-10-CM | POA: Diagnosis not present

## 2019-09-29 DIAGNOSIS — D631 Anemia in chronic kidney disease: Secondary | ICD-10-CM | POA: Diagnosis not present

## 2019-09-29 DIAGNOSIS — N186 End stage renal disease: Secondary | ICD-10-CM | POA: Diagnosis not present

## 2019-09-29 DIAGNOSIS — Z992 Dependence on renal dialysis: Secondary | ICD-10-CM | POA: Diagnosis not present

## 2019-09-30 DIAGNOSIS — D631 Anemia in chronic kidney disease: Secondary | ICD-10-CM | POA: Diagnosis not present

## 2019-09-30 DIAGNOSIS — Z992 Dependence on renal dialysis: Secondary | ICD-10-CM | POA: Diagnosis not present

## 2019-09-30 DIAGNOSIS — N186 End stage renal disease: Secondary | ICD-10-CM | POA: Diagnosis not present

## 2019-09-30 DIAGNOSIS — D509 Iron deficiency anemia, unspecified: Secondary | ICD-10-CM | POA: Diagnosis not present

## 2019-10-01 DIAGNOSIS — N186 End stage renal disease: Secondary | ICD-10-CM | POA: Diagnosis not present

## 2019-10-01 DIAGNOSIS — D631 Anemia in chronic kidney disease: Secondary | ICD-10-CM | POA: Diagnosis not present

## 2019-10-01 DIAGNOSIS — Z992 Dependence on renal dialysis: Secondary | ICD-10-CM | POA: Diagnosis not present

## 2019-10-01 DIAGNOSIS — D509 Iron deficiency anemia, unspecified: Secondary | ICD-10-CM | POA: Diagnosis not present

## 2019-10-02 DIAGNOSIS — N186 End stage renal disease: Secondary | ICD-10-CM | POA: Diagnosis not present

## 2019-10-02 DIAGNOSIS — D509 Iron deficiency anemia, unspecified: Secondary | ICD-10-CM | POA: Diagnosis not present

## 2019-10-02 DIAGNOSIS — Z992 Dependence on renal dialysis: Secondary | ICD-10-CM | POA: Diagnosis not present

## 2019-10-02 DIAGNOSIS — D631 Anemia in chronic kidney disease: Secondary | ICD-10-CM | POA: Diagnosis not present

## 2019-10-03 DIAGNOSIS — Z992 Dependence on renal dialysis: Secondary | ICD-10-CM | POA: Diagnosis not present

## 2019-10-03 DIAGNOSIS — D631 Anemia in chronic kidney disease: Secondary | ICD-10-CM | POA: Diagnosis not present

## 2019-10-03 DIAGNOSIS — D509 Iron deficiency anemia, unspecified: Secondary | ICD-10-CM | POA: Diagnosis not present

## 2019-10-03 DIAGNOSIS — N186 End stage renal disease: Secondary | ICD-10-CM | POA: Diagnosis not present

## 2019-10-04 DIAGNOSIS — D631 Anemia in chronic kidney disease: Secondary | ICD-10-CM | POA: Diagnosis not present

## 2019-10-04 DIAGNOSIS — Z992 Dependence on renal dialysis: Secondary | ICD-10-CM | POA: Diagnosis not present

## 2019-10-04 DIAGNOSIS — D509 Iron deficiency anemia, unspecified: Secondary | ICD-10-CM | POA: Diagnosis not present

## 2019-10-04 DIAGNOSIS — N186 End stage renal disease: Secondary | ICD-10-CM | POA: Diagnosis not present

## 2019-10-05 DIAGNOSIS — Z992 Dependence on renal dialysis: Secondary | ICD-10-CM | POA: Diagnosis not present

## 2019-10-05 DIAGNOSIS — D509 Iron deficiency anemia, unspecified: Secondary | ICD-10-CM | POA: Diagnosis not present

## 2019-10-05 DIAGNOSIS — N186 End stage renal disease: Secondary | ICD-10-CM | POA: Diagnosis not present

## 2019-10-05 DIAGNOSIS — D631 Anemia in chronic kidney disease: Secondary | ICD-10-CM | POA: Diagnosis not present

## 2019-10-06 DIAGNOSIS — D631 Anemia in chronic kidney disease: Secondary | ICD-10-CM | POA: Diagnosis not present

## 2019-10-06 DIAGNOSIS — N186 End stage renal disease: Secondary | ICD-10-CM | POA: Diagnosis not present

## 2019-10-06 DIAGNOSIS — D509 Iron deficiency anemia, unspecified: Secondary | ICD-10-CM | POA: Diagnosis not present

## 2019-10-06 DIAGNOSIS — Z992 Dependence on renal dialysis: Secondary | ICD-10-CM | POA: Diagnosis not present

## 2019-10-07 DIAGNOSIS — Z992 Dependence on renal dialysis: Secondary | ICD-10-CM | POA: Diagnosis not present

## 2019-10-07 DIAGNOSIS — D509 Iron deficiency anemia, unspecified: Secondary | ICD-10-CM | POA: Diagnosis not present

## 2019-10-07 DIAGNOSIS — D631 Anemia in chronic kidney disease: Secondary | ICD-10-CM | POA: Diagnosis not present

## 2019-10-07 DIAGNOSIS — N186 End stage renal disease: Secondary | ICD-10-CM | POA: Diagnosis not present

## 2019-10-08 DIAGNOSIS — D631 Anemia in chronic kidney disease: Secondary | ICD-10-CM | POA: Diagnosis not present

## 2019-10-08 DIAGNOSIS — Z992 Dependence on renal dialysis: Secondary | ICD-10-CM | POA: Diagnosis not present

## 2019-10-08 DIAGNOSIS — D509 Iron deficiency anemia, unspecified: Secondary | ICD-10-CM | POA: Diagnosis not present

## 2019-10-08 DIAGNOSIS — N186 End stage renal disease: Secondary | ICD-10-CM | POA: Diagnosis not present

## 2019-10-09 DIAGNOSIS — D509 Iron deficiency anemia, unspecified: Secondary | ICD-10-CM | POA: Diagnosis not present

## 2019-10-09 DIAGNOSIS — N186 End stage renal disease: Secondary | ICD-10-CM | POA: Diagnosis not present

## 2019-10-09 DIAGNOSIS — Z992 Dependence on renal dialysis: Secondary | ICD-10-CM | POA: Diagnosis not present

## 2019-10-09 DIAGNOSIS — D631 Anemia in chronic kidney disease: Secondary | ICD-10-CM | POA: Diagnosis not present

## 2019-10-10 DIAGNOSIS — Z992 Dependence on renal dialysis: Secondary | ICD-10-CM | POA: Diagnosis not present

## 2019-10-10 DIAGNOSIS — D631 Anemia in chronic kidney disease: Secondary | ICD-10-CM | POA: Diagnosis not present

## 2019-10-10 DIAGNOSIS — N186 End stage renal disease: Secondary | ICD-10-CM | POA: Diagnosis not present

## 2019-10-10 DIAGNOSIS — D509 Iron deficiency anemia, unspecified: Secondary | ICD-10-CM | POA: Diagnosis not present

## 2019-10-11 DIAGNOSIS — D631 Anemia in chronic kidney disease: Secondary | ICD-10-CM | POA: Diagnosis not present

## 2019-10-11 DIAGNOSIS — D509 Iron deficiency anemia, unspecified: Secondary | ICD-10-CM | POA: Diagnosis not present

## 2019-10-11 DIAGNOSIS — Z992 Dependence on renal dialysis: Secondary | ICD-10-CM | POA: Diagnosis not present

## 2019-10-11 DIAGNOSIS — N186 End stage renal disease: Secondary | ICD-10-CM | POA: Diagnosis not present

## 2019-10-12 DIAGNOSIS — Z992 Dependence on renal dialysis: Secondary | ICD-10-CM | POA: Diagnosis not present

## 2019-10-12 DIAGNOSIS — D631 Anemia in chronic kidney disease: Secondary | ICD-10-CM | POA: Diagnosis not present

## 2019-10-12 DIAGNOSIS — D509 Iron deficiency anemia, unspecified: Secondary | ICD-10-CM | POA: Diagnosis not present

## 2019-10-12 DIAGNOSIS — N186 End stage renal disease: Secondary | ICD-10-CM | POA: Diagnosis not present

## 2019-10-13 DIAGNOSIS — N186 End stage renal disease: Secondary | ICD-10-CM | POA: Diagnosis not present

## 2019-10-13 DIAGNOSIS — Z992 Dependence on renal dialysis: Secondary | ICD-10-CM | POA: Diagnosis not present

## 2019-10-13 DIAGNOSIS — D631 Anemia in chronic kidney disease: Secondary | ICD-10-CM | POA: Diagnosis not present

## 2019-10-13 DIAGNOSIS — D509 Iron deficiency anemia, unspecified: Secondary | ICD-10-CM | POA: Diagnosis not present

## 2019-10-14 DIAGNOSIS — Z992 Dependence on renal dialysis: Secondary | ICD-10-CM | POA: Diagnosis not present

## 2019-10-14 DIAGNOSIS — D509 Iron deficiency anemia, unspecified: Secondary | ICD-10-CM | POA: Diagnosis not present

## 2019-10-14 DIAGNOSIS — N186 End stage renal disease: Secondary | ICD-10-CM | POA: Diagnosis not present

## 2019-10-14 DIAGNOSIS — D631 Anemia in chronic kidney disease: Secondary | ICD-10-CM | POA: Diagnosis not present

## 2019-10-15 ENCOUNTER — Other Ambulatory Visit: Payer: Self-pay | Admitting: Internal Medicine

## 2019-10-15 DIAGNOSIS — N186 End stage renal disease: Secondary | ICD-10-CM | POA: Diagnosis not present

## 2019-10-15 DIAGNOSIS — D509 Iron deficiency anemia, unspecified: Secondary | ICD-10-CM | POA: Diagnosis not present

## 2019-10-15 DIAGNOSIS — Z992 Dependence on renal dialysis: Secondary | ICD-10-CM | POA: Diagnosis not present

## 2019-10-15 DIAGNOSIS — D631 Anemia in chronic kidney disease: Secondary | ICD-10-CM | POA: Diagnosis not present

## 2019-10-16 DIAGNOSIS — D509 Iron deficiency anemia, unspecified: Secondary | ICD-10-CM | POA: Diagnosis not present

## 2019-10-16 DIAGNOSIS — N186 End stage renal disease: Secondary | ICD-10-CM | POA: Diagnosis not present

## 2019-10-16 DIAGNOSIS — Z992 Dependence on renal dialysis: Secondary | ICD-10-CM | POA: Diagnosis not present

## 2019-10-16 DIAGNOSIS — D631 Anemia in chronic kidney disease: Secondary | ICD-10-CM | POA: Diagnosis not present

## 2019-10-17 DIAGNOSIS — Z992 Dependence on renal dialysis: Secondary | ICD-10-CM | POA: Diagnosis not present

## 2019-10-17 DIAGNOSIS — D631 Anemia in chronic kidney disease: Secondary | ICD-10-CM | POA: Diagnosis not present

## 2019-10-17 DIAGNOSIS — D509 Iron deficiency anemia, unspecified: Secondary | ICD-10-CM | POA: Diagnosis not present

## 2019-10-17 DIAGNOSIS — N186 End stage renal disease: Secondary | ICD-10-CM | POA: Diagnosis not present

## 2019-10-18 DIAGNOSIS — N186 End stage renal disease: Secondary | ICD-10-CM | POA: Diagnosis not present

## 2019-10-18 DIAGNOSIS — D509 Iron deficiency anemia, unspecified: Secondary | ICD-10-CM | POA: Diagnosis not present

## 2019-10-18 DIAGNOSIS — Z992 Dependence on renal dialysis: Secondary | ICD-10-CM | POA: Diagnosis not present

## 2019-10-18 DIAGNOSIS — D631 Anemia in chronic kidney disease: Secondary | ICD-10-CM | POA: Diagnosis not present

## 2019-10-19 DIAGNOSIS — Z992 Dependence on renal dialysis: Secondary | ICD-10-CM | POA: Diagnosis not present

## 2019-10-19 DIAGNOSIS — D509 Iron deficiency anemia, unspecified: Secondary | ICD-10-CM | POA: Diagnosis not present

## 2019-10-19 DIAGNOSIS — D631 Anemia in chronic kidney disease: Secondary | ICD-10-CM | POA: Diagnosis not present

## 2019-10-19 DIAGNOSIS — N186 End stage renal disease: Secondary | ICD-10-CM | POA: Diagnosis not present

## 2019-10-20 DIAGNOSIS — Z992 Dependence on renal dialysis: Secondary | ICD-10-CM | POA: Diagnosis not present

## 2019-10-20 DIAGNOSIS — D509 Iron deficiency anemia, unspecified: Secondary | ICD-10-CM | POA: Diagnosis not present

## 2019-10-20 DIAGNOSIS — N186 End stage renal disease: Secondary | ICD-10-CM | POA: Diagnosis not present

## 2019-10-20 DIAGNOSIS — D631 Anemia in chronic kidney disease: Secondary | ICD-10-CM | POA: Diagnosis not present

## 2019-10-21 DIAGNOSIS — N186 End stage renal disease: Secondary | ICD-10-CM | POA: Diagnosis not present

## 2019-10-21 DIAGNOSIS — D631 Anemia in chronic kidney disease: Secondary | ICD-10-CM | POA: Diagnosis not present

## 2019-10-21 DIAGNOSIS — Z992 Dependence on renal dialysis: Secondary | ICD-10-CM | POA: Diagnosis not present

## 2019-10-21 DIAGNOSIS — D509 Iron deficiency anemia, unspecified: Secondary | ICD-10-CM | POA: Diagnosis not present

## 2019-10-22 DIAGNOSIS — N186 End stage renal disease: Secondary | ICD-10-CM | POA: Diagnosis not present

## 2019-10-22 DIAGNOSIS — Z992 Dependence on renal dialysis: Secondary | ICD-10-CM | POA: Diagnosis not present

## 2019-10-22 DIAGNOSIS — D631 Anemia in chronic kidney disease: Secondary | ICD-10-CM | POA: Diagnosis not present

## 2019-10-22 DIAGNOSIS — D509 Iron deficiency anemia, unspecified: Secondary | ICD-10-CM | POA: Diagnosis not present

## 2019-10-29 DIAGNOSIS — Z992 Dependence on renal dialysis: Secondary | ICD-10-CM | POA: Diagnosis not present

## 2019-11-22 DIAGNOSIS — D509 Iron deficiency anemia, unspecified: Secondary | ICD-10-CM | POA: Diagnosis not present

## 2019-11-22 DIAGNOSIS — Z992 Dependence on renal dialysis: Secondary | ICD-10-CM | POA: Diagnosis not present

## 2019-11-22 DIAGNOSIS — N186 End stage renal disease: Secondary | ICD-10-CM | POA: Diagnosis not present

## 2019-11-22 DIAGNOSIS — N2581 Secondary hyperparathyroidism of renal origin: Secondary | ICD-10-CM | POA: Diagnosis not present

## 2019-11-23 DIAGNOSIS — N2581 Secondary hyperparathyroidism of renal origin: Secondary | ICD-10-CM | POA: Diagnosis not present

## 2019-11-23 DIAGNOSIS — Z992 Dependence on renal dialysis: Secondary | ICD-10-CM | POA: Diagnosis not present

## 2019-11-23 DIAGNOSIS — D509 Iron deficiency anemia, unspecified: Secondary | ICD-10-CM | POA: Diagnosis not present

## 2019-11-23 DIAGNOSIS — N186 End stage renal disease: Secondary | ICD-10-CM | POA: Diagnosis not present

## 2019-11-24 DIAGNOSIS — N186 End stage renal disease: Secondary | ICD-10-CM | POA: Diagnosis not present

## 2019-11-24 DIAGNOSIS — Z992 Dependence on renal dialysis: Secondary | ICD-10-CM | POA: Diagnosis not present

## 2019-11-24 DIAGNOSIS — D509 Iron deficiency anemia, unspecified: Secondary | ICD-10-CM | POA: Diagnosis not present

## 2019-11-24 DIAGNOSIS — N2581 Secondary hyperparathyroidism of renal origin: Secondary | ICD-10-CM | POA: Diagnosis not present

## 2019-11-25 DIAGNOSIS — N2581 Secondary hyperparathyroidism of renal origin: Secondary | ICD-10-CM | POA: Diagnosis not present

## 2019-11-25 DIAGNOSIS — N186 End stage renal disease: Secondary | ICD-10-CM | POA: Diagnosis not present

## 2019-11-25 DIAGNOSIS — D509 Iron deficiency anemia, unspecified: Secondary | ICD-10-CM | POA: Diagnosis not present

## 2019-11-25 DIAGNOSIS — Z992 Dependence on renal dialysis: Secondary | ICD-10-CM | POA: Diagnosis not present

## 2019-11-27 DIAGNOSIS — N2581 Secondary hyperparathyroidism of renal origin: Secondary | ICD-10-CM | POA: Diagnosis not present

## 2019-11-27 DIAGNOSIS — Z992 Dependence on renal dialysis: Secondary | ICD-10-CM | POA: Diagnosis not present

## 2019-11-27 DIAGNOSIS — N186 End stage renal disease: Secondary | ICD-10-CM | POA: Diagnosis not present

## 2019-11-27 DIAGNOSIS — D509 Iron deficiency anemia, unspecified: Secondary | ICD-10-CM | POA: Diagnosis not present

## 2019-11-28 DIAGNOSIS — N2581 Secondary hyperparathyroidism of renal origin: Secondary | ICD-10-CM | POA: Diagnosis not present

## 2019-11-28 DIAGNOSIS — D509 Iron deficiency anemia, unspecified: Secondary | ICD-10-CM | POA: Diagnosis not present

## 2019-11-28 DIAGNOSIS — N186 End stage renal disease: Secondary | ICD-10-CM | POA: Diagnosis not present

## 2019-11-28 DIAGNOSIS — Z992 Dependence on renal dialysis: Secondary | ICD-10-CM | POA: Diagnosis not present

## 2019-11-29 DIAGNOSIS — N2581 Secondary hyperparathyroidism of renal origin: Secondary | ICD-10-CM | POA: Diagnosis not present

## 2019-11-29 DIAGNOSIS — D509 Iron deficiency anemia, unspecified: Secondary | ICD-10-CM | POA: Diagnosis not present

## 2019-11-29 DIAGNOSIS — Z992 Dependence on renal dialysis: Secondary | ICD-10-CM | POA: Diagnosis not present

## 2019-11-29 DIAGNOSIS — N186 End stage renal disease: Secondary | ICD-10-CM | POA: Diagnosis not present

## 2019-11-29 DIAGNOSIS — E119 Type 2 diabetes mellitus without complications: Secondary | ICD-10-CM | POA: Diagnosis not present

## 2019-11-30 DIAGNOSIS — N2581 Secondary hyperparathyroidism of renal origin: Secondary | ICD-10-CM | POA: Diagnosis not present

## 2019-11-30 DIAGNOSIS — D509 Iron deficiency anemia, unspecified: Secondary | ICD-10-CM | POA: Diagnosis not present

## 2019-11-30 DIAGNOSIS — N186 End stage renal disease: Secondary | ICD-10-CM | POA: Diagnosis not present

## 2019-11-30 DIAGNOSIS — Z992 Dependence on renal dialysis: Secondary | ICD-10-CM | POA: Diagnosis not present

## 2019-12-01 DIAGNOSIS — N2581 Secondary hyperparathyroidism of renal origin: Secondary | ICD-10-CM | POA: Diagnosis not present

## 2019-12-01 DIAGNOSIS — N186 End stage renal disease: Secondary | ICD-10-CM | POA: Diagnosis not present

## 2019-12-01 DIAGNOSIS — D509 Iron deficiency anemia, unspecified: Secondary | ICD-10-CM | POA: Diagnosis not present

## 2019-12-01 DIAGNOSIS — Z992 Dependence on renal dialysis: Secondary | ICD-10-CM | POA: Diagnosis not present

## 2019-12-02 DIAGNOSIS — N2581 Secondary hyperparathyroidism of renal origin: Secondary | ICD-10-CM | POA: Diagnosis not present

## 2019-12-02 DIAGNOSIS — Z992 Dependence on renal dialysis: Secondary | ICD-10-CM | POA: Diagnosis not present

## 2019-12-02 DIAGNOSIS — D509 Iron deficiency anemia, unspecified: Secondary | ICD-10-CM | POA: Diagnosis not present

## 2019-12-02 DIAGNOSIS — N186 End stage renal disease: Secondary | ICD-10-CM | POA: Diagnosis not present

## 2019-12-03 DIAGNOSIS — D509 Iron deficiency anemia, unspecified: Secondary | ICD-10-CM | POA: Diagnosis not present

## 2019-12-03 DIAGNOSIS — N186 End stage renal disease: Secondary | ICD-10-CM | POA: Diagnosis not present

## 2019-12-03 DIAGNOSIS — Z992 Dependence on renal dialysis: Secondary | ICD-10-CM | POA: Diagnosis not present

## 2019-12-03 DIAGNOSIS — N2581 Secondary hyperparathyroidism of renal origin: Secondary | ICD-10-CM | POA: Diagnosis not present

## 2019-12-04 DIAGNOSIS — N186 End stage renal disease: Secondary | ICD-10-CM | POA: Diagnosis not present

## 2019-12-04 DIAGNOSIS — N2581 Secondary hyperparathyroidism of renal origin: Secondary | ICD-10-CM | POA: Diagnosis not present

## 2019-12-04 DIAGNOSIS — D509 Iron deficiency anemia, unspecified: Secondary | ICD-10-CM | POA: Diagnosis not present

## 2019-12-04 DIAGNOSIS — Z992 Dependence on renal dialysis: Secondary | ICD-10-CM | POA: Diagnosis not present

## 2019-12-05 DIAGNOSIS — D509 Iron deficiency anemia, unspecified: Secondary | ICD-10-CM | POA: Diagnosis not present

## 2019-12-05 DIAGNOSIS — N2581 Secondary hyperparathyroidism of renal origin: Secondary | ICD-10-CM | POA: Diagnosis not present

## 2019-12-05 DIAGNOSIS — N186 End stage renal disease: Secondary | ICD-10-CM | POA: Diagnosis not present

## 2019-12-05 DIAGNOSIS — Z992 Dependence on renal dialysis: Secondary | ICD-10-CM | POA: Diagnosis not present

## 2019-12-06 DIAGNOSIS — Z992 Dependence on renal dialysis: Secondary | ICD-10-CM | POA: Diagnosis not present

## 2019-12-06 DIAGNOSIS — D509 Iron deficiency anemia, unspecified: Secondary | ICD-10-CM | POA: Diagnosis not present

## 2019-12-06 DIAGNOSIS — N186 End stage renal disease: Secondary | ICD-10-CM | POA: Diagnosis not present

## 2019-12-06 DIAGNOSIS — N2581 Secondary hyperparathyroidism of renal origin: Secondary | ICD-10-CM | POA: Diagnosis not present

## 2019-12-07 DIAGNOSIS — Z992 Dependence on renal dialysis: Secondary | ICD-10-CM | POA: Diagnosis not present

## 2019-12-07 DIAGNOSIS — N2581 Secondary hyperparathyroidism of renal origin: Secondary | ICD-10-CM | POA: Diagnosis not present

## 2019-12-07 DIAGNOSIS — D509 Iron deficiency anemia, unspecified: Secondary | ICD-10-CM | POA: Diagnosis not present

## 2019-12-07 DIAGNOSIS — N186 End stage renal disease: Secondary | ICD-10-CM | POA: Diagnosis not present

## 2019-12-08 DIAGNOSIS — N186 End stage renal disease: Secondary | ICD-10-CM | POA: Diagnosis not present

## 2019-12-08 DIAGNOSIS — D509 Iron deficiency anemia, unspecified: Secondary | ICD-10-CM | POA: Diagnosis not present

## 2019-12-08 DIAGNOSIS — Z992 Dependence on renal dialysis: Secondary | ICD-10-CM | POA: Diagnosis not present

## 2019-12-08 DIAGNOSIS — N2581 Secondary hyperparathyroidism of renal origin: Secondary | ICD-10-CM | POA: Diagnosis not present

## 2019-12-09 DIAGNOSIS — N2581 Secondary hyperparathyroidism of renal origin: Secondary | ICD-10-CM | POA: Diagnosis not present

## 2019-12-09 DIAGNOSIS — N186 End stage renal disease: Secondary | ICD-10-CM | POA: Diagnosis not present

## 2019-12-09 DIAGNOSIS — D509 Iron deficiency anemia, unspecified: Secondary | ICD-10-CM | POA: Diagnosis not present

## 2019-12-09 DIAGNOSIS — Z992 Dependence on renal dialysis: Secondary | ICD-10-CM | POA: Diagnosis not present

## 2019-12-10 DIAGNOSIS — D509 Iron deficiency anemia, unspecified: Secondary | ICD-10-CM | POA: Diagnosis not present

## 2019-12-10 DIAGNOSIS — Z992 Dependence on renal dialysis: Secondary | ICD-10-CM | POA: Diagnosis not present

## 2019-12-10 DIAGNOSIS — N2581 Secondary hyperparathyroidism of renal origin: Secondary | ICD-10-CM | POA: Diagnosis not present

## 2019-12-10 DIAGNOSIS — N186 End stage renal disease: Secondary | ICD-10-CM | POA: Diagnosis not present

## 2019-12-11 DIAGNOSIS — Z992 Dependence on renal dialysis: Secondary | ICD-10-CM | POA: Diagnosis not present

## 2019-12-11 DIAGNOSIS — N2581 Secondary hyperparathyroidism of renal origin: Secondary | ICD-10-CM | POA: Diagnosis not present

## 2019-12-11 DIAGNOSIS — N186 End stage renal disease: Secondary | ICD-10-CM | POA: Diagnosis not present

## 2019-12-11 DIAGNOSIS — D509 Iron deficiency anemia, unspecified: Secondary | ICD-10-CM | POA: Diagnosis not present

## 2019-12-12 DIAGNOSIS — Z992 Dependence on renal dialysis: Secondary | ICD-10-CM | POA: Diagnosis not present

## 2019-12-12 DIAGNOSIS — N186 End stage renal disease: Secondary | ICD-10-CM | POA: Diagnosis not present

## 2019-12-12 DIAGNOSIS — D509 Iron deficiency anemia, unspecified: Secondary | ICD-10-CM | POA: Diagnosis not present

## 2019-12-12 DIAGNOSIS — N2581 Secondary hyperparathyroidism of renal origin: Secondary | ICD-10-CM | POA: Diagnosis not present

## 2019-12-13 DIAGNOSIS — Z992 Dependence on renal dialysis: Secondary | ICD-10-CM | POA: Diagnosis not present

## 2019-12-13 DIAGNOSIS — N2581 Secondary hyperparathyroidism of renal origin: Secondary | ICD-10-CM | POA: Diagnosis not present

## 2019-12-13 DIAGNOSIS — D509 Iron deficiency anemia, unspecified: Secondary | ICD-10-CM | POA: Diagnosis not present

## 2019-12-13 DIAGNOSIS — N186 End stage renal disease: Secondary | ICD-10-CM | POA: Diagnosis not present

## 2019-12-14 DIAGNOSIS — D509 Iron deficiency anemia, unspecified: Secondary | ICD-10-CM | POA: Diagnosis not present

## 2019-12-14 DIAGNOSIS — N2581 Secondary hyperparathyroidism of renal origin: Secondary | ICD-10-CM | POA: Diagnosis not present

## 2019-12-14 DIAGNOSIS — Z992 Dependence on renal dialysis: Secondary | ICD-10-CM | POA: Diagnosis not present

## 2019-12-14 DIAGNOSIS — N186 End stage renal disease: Secondary | ICD-10-CM | POA: Diagnosis not present

## 2019-12-15 DIAGNOSIS — D509 Iron deficiency anemia, unspecified: Secondary | ICD-10-CM | POA: Diagnosis not present

## 2019-12-15 DIAGNOSIS — N2581 Secondary hyperparathyroidism of renal origin: Secondary | ICD-10-CM | POA: Diagnosis not present

## 2019-12-15 DIAGNOSIS — Z992 Dependence on renal dialysis: Secondary | ICD-10-CM | POA: Diagnosis not present

## 2019-12-15 DIAGNOSIS — N186 End stage renal disease: Secondary | ICD-10-CM | POA: Diagnosis not present

## 2019-12-16 DIAGNOSIS — D509 Iron deficiency anemia, unspecified: Secondary | ICD-10-CM | POA: Diagnosis not present

## 2019-12-16 DIAGNOSIS — N2581 Secondary hyperparathyroidism of renal origin: Secondary | ICD-10-CM | POA: Diagnosis not present

## 2019-12-16 DIAGNOSIS — Z992 Dependence on renal dialysis: Secondary | ICD-10-CM | POA: Diagnosis not present

## 2019-12-16 DIAGNOSIS — N186 End stage renal disease: Secondary | ICD-10-CM | POA: Diagnosis not present

## 2019-12-17 DIAGNOSIS — N2581 Secondary hyperparathyroidism of renal origin: Secondary | ICD-10-CM | POA: Diagnosis not present

## 2019-12-17 DIAGNOSIS — Z992 Dependence on renal dialysis: Secondary | ICD-10-CM | POA: Diagnosis not present

## 2019-12-17 DIAGNOSIS — N186 End stage renal disease: Secondary | ICD-10-CM | POA: Diagnosis not present

## 2019-12-17 DIAGNOSIS — D509 Iron deficiency anemia, unspecified: Secondary | ICD-10-CM | POA: Diagnosis not present

## 2019-12-18 DIAGNOSIS — Z992 Dependence on renal dialysis: Secondary | ICD-10-CM | POA: Diagnosis not present

## 2019-12-18 DIAGNOSIS — D509 Iron deficiency anemia, unspecified: Secondary | ICD-10-CM | POA: Diagnosis not present

## 2019-12-18 DIAGNOSIS — N186 End stage renal disease: Secondary | ICD-10-CM | POA: Diagnosis not present

## 2019-12-18 DIAGNOSIS — N2581 Secondary hyperparathyroidism of renal origin: Secondary | ICD-10-CM | POA: Diagnosis not present

## 2019-12-19 DIAGNOSIS — N2581 Secondary hyperparathyroidism of renal origin: Secondary | ICD-10-CM | POA: Diagnosis not present

## 2019-12-19 DIAGNOSIS — N186 End stage renal disease: Secondary | ICD-10-CM | POA: Diagnosis not present

## 2019-12-19 DIAGNOSIS — D509 Iron deficiency anemia, unspecified: Secondary | ICD-10-CM | POA: Diagnosis not present

## 2019-12-19 DIAGNOSIS — Z992 Dependence on renal dialysis: Secondary | ICD-10-CM | POA: Diagnosis not present

## 2019-12-20 DIAGNOSIS — N2581 Secondary hyperparathyroidism of renal origin: Secondary | ICD-10-CM | POA: Diagnosis not present

## 2019-12-20 DIAGNOSIS — N186 End stage renal disease: Secondary | ICD-10-CM | POA: Diagnosis not present

## 2019-12-20 DIAGNOSIS — Z992 Dependence on renal dialysis: Secondary | ICD-10-CM | POA: Diagnosis not present

## 2019-12-20 DIAGNOSIS — D509 Iron deficiency anemia, unspecified: Secondary | ICD-10-CM | POA: Diagnosis not present

## 2019-12-21 DIAGNOSIS — D509 Iron deficiency anemia, unspecified: Secondary | ICD-10-CM | POA: Diagnosis not present

## 2019-12-21 DIAGNOSIS — Z992 Dependence on renal dialysis: Secondary | ICD-10-CM | POA: Diagnosis not present

## 2019-12-21 DIAGNOSIS — N186 End stage renal disease: Secondary | ICD-10-CM | POA: Diagnosis not present

## 2019-12-21 DIAGNOSIS — N2581 Secondary hyperparathyroidism of renal origin: Secondary | ICD-10-CM | POA: Diagnosis not present

## 2019-12-22 DIAGNOSIS — N186 End stage renal disease: Secondary | ICD-10-CM | POA: Diagnosis not present

## 2019-12-22 DIAGNOSIS — Z992 Dependence on renal dialysis: Secondary | ICD-10-CM | POA: Diagnosis not present

## 2019-12-23 DIAGNOSIS — N186 End stage renal disease: Secondary | ICD-10-CM | POA: Diagnosis not present

## 2019-12-23 DIAGNOSIS — Z992 Dependence on renal dialysis: Secondary | ICD-10-CM | POA: Diagnosis not present

## 2019-12-23 DIAGNOSIS — D509 Iron deficiency anemia, unspecified: Secondary | ICD-10-CM | POA: Diagnosis not present

## 2019-12-24 DIAGNOSIS — N186 End stage renal disease: Secondary | ICD-10-CM | POA: Diagnosis not present

## 2019-12-24 DIAGNOSIS — Z992 Dependence on renal dialysis: Secondary | ICD-10-CM | POA: Diagnosis not present

## 2019-12-24 DIAGNOSIS — D509 Iron deficiency anemia, unspecified: Secondary | ICD-10-CM | POA: Diagnosis not present

## 2019-12-25 DIAGNOSIS — D509 Iron deficiency anemia, unspecified: Secondary | ICD-10-CM | POA: Diagnosis not present

## 2019-12-25 DIAGNOSIS — N186 End stage renal disease: Secondary | ICD-10-CM | POA: Diagnosis not present

## 2019-12-25 DIAGNOSIS — Z992 Dependence on renal dialysis: Secondary | ICD-10-CM | POA: Diagnosis not present

## 2019-12-26 DIAGNOSIS — N186 End stage renal disease: Secondary | ICD-10-CM | POA: Diagnosis not present

## 2019-12-26 DIAGNOSIS — Z992 Dependence on renal dialysis: Secondary | ICD-10-CM | POA: Diagnosis not present

## 2019-12-26 DIAGNOSIS — D509 Iron deficiency anemia, unspecified: Secondary | ICD-10-CM | POA: Diagnosis not present

## 2019-12-27 DIAGNOSIS — N186 End stage renal disease: Secondary | ICD-10-CM | POA: Diagnosis not present

## 2019-12-27 DIAGNOSIS — D509 Iron deficiency anemia, unspecified: Secondary | ICD-10-CM | POA: Diagnosis not present

## 2019-12-27 DIAGNOSIS — Z992 Dependence on renal dialysis: Secondary | ICD-10-CM | POA: Diagnosis not present

## 2019-12-28 DIAGNOSIS — Z992 Dependence on renal dialysis: Secondary | ICD-10-CM | POA: Diagnosis not present

## 2019-12-28 DIAGNOSIS — D509 Iron deficiency anemia, unspecified: Secondary | ICD-10-CM | POA: Diagnosis not present

## 2019-12-28 DIAGNOSIS — N186 End stage renal disease: Secondary | ICD-10-CM | POA: Diagnosis not present

## 2019-12-29 DIAGNOSIS — D509 Iron deficiency anemia, unspecified: Secondary | ICD-10-CM | POA: Diagnosis not present

## 2019-12-29 DIAGNOSIS — N186 End stage renal disease: Secondary | ICD-10-CM | POA: Diagnosis not present

## 2019-12-29 DIAGNOSIS — Z992 Dependence on renal dialysis: Secondary | ICD-10-CM | POA: Diagnosis not present

## 2019-12-30 DIAGNOSIS — N186 End stage renal disease: Secondary | ICD-10-CM | POA: Diagnosis not present

## 2019-12-30 DIAGNOSIS — D509 Iron deficiency anemia, unspecified: Secondary | ICD-10-CM | POA: Diagnosis not present

## 2019-12-30 DIAGNOSIS — Z992 Dependence on renal dialysis: Secondary | ICD-10-CM | POA: Diagnosis not present

## 2019-12-31 DIAGNOSIS — D509 Iron deficiency anemia, unspecified: Secondary | ICD-10-CM | POA: Diagnosis not present

## 2019-12-31 DIAGNOSIS — N186 End stage renal disease: Secondary | ICD-10-CM | POA: Diagnosis not present

## 2019-12-31 DIAGNOSIS — Z992 Dependence on renal dialysis: Secondary | ICD-10-CM | POA: Diagnosis not present

## 2020-01-01 DIAGNOSIS — N186 End stage renal disease: Secondary | ICD-10-CM | POA: Diagnosis not present

## 2020-01-01 DIAGNOSIS — D509 Iron deficiency anemia, unspecified: Secondary | ICD-10-CM | POA: Diagnosis not present

## 2020-01-01 DIAGNOSIS — Z992 Dependence on renal dialysis: Secondary | ICD-10-CM | POA: Diagnosis not present

## 2020-01-02 DIAGNOSIS — D509 Iron deficiency anemia, unspecified: Secondary | ICD-10-CM | POA: Diagnosis not present

## 2020-01-02 DIAGNOSIS — N186 End stage renal disease: Secondary | ICD-10-CM | POA: Diagnosis not present

## 2020-01-02 DIAGNOSIS — Z992 Dependence on renal dialysis: Secondary | ICD-10-CM | POA: Diagnosis not present

## 2020-01-03 DIAGNOSIS — N186 End stage renal disease: Secondary | ICD-10-CM | POA: Diagnosis not present

## 2020-01-03 DIAGNOSIS — D509 Iron deficiency anemia, unspecified: Secondary | ICD-10-CM | POA: Diagnosis not present

## 2020-01-03 DIAGNOSIS — Z992 Dependence on renal dialysis: Secondary | ICD-10-CM | POA: Diagnosis not present

## 2020-01-04 DIAGNOSIS — D509 Iron deficiency anemia, unspecified: Secondary | ICD-10-CM | POA: Diagnosis not present

## 2020-01-04 DIAGNOSIS — Z992 Dependence on renal dialysis: Secondary | ICD-10-CM | POA: Diagnosis not present

## 2020-01-04 DIAGNOSIS — N186 End stage renal disease: Secondary | ICD-10-CM | POA: Diagnosis not present

## 2020-01-05 DIAGNOSIS — Z992 Dependence on renal dialysis: Secondary | ICD-10-CM | POA: Diagnosis not present

## 2020-01-05 DIAGNOSIS — D509 Iron deficiency anemia, unspecified: Secondary | ICD-10-CM | POA: Diagnosis not present

## 2020-01-05 DIAGNOSIS — N186 End stage renal disease: Secondary | ICD-10-CM | POA: Diagnosis not present

## 2020-01-06 DIAGNOSIS — N186 End stage renal disease: Secondary | ICD-10-CM | POA: Diagnosis not present

## 2020-01-06 DIAGNOSIS — D509 Iron deficiency anemia, unspecified: Secondary | ICD-10-CM | POA: Diagnosis not present

## 2020-01-06 DIAGNOSIS — Z992 Dependence on renal dialysis: Secondary | ICD-10-CM | POA: Diagnosis not present

## 2020-01-07 DIAGNOSIS — D509 Iron deficiency anemia, unspecified: Secondary | ICD-10-CM | POA: Diagnosis not present

## 2020-01-07 DIAGNOSIS — Z992 Dependence on renal dialysis: Secondary | ICD-10-CM | POA: Diagnosis not present

## 2020-01-07 DIAGNOSIS — N186 End stage renal disease: Secondary | ICD-10-CM | POA: Diagnosis not present

## 2020-01-08 DIAGNOSIS — D509 Iron deficiency anemia, unspecified: Secondary | ICD-10-CM | POA: Diagnosis not present

## 2020-01-08 DIAGNOSIS — N186 End stage renal disease: Secondary | ICD-10-CM | POA: Diagnosis not present

## 2020-01-08 DIAGNOSIS — Z992 Dependence on renal dialysis: Secondary | ICD-10-CM | POA: Diagnosis not present

## 2020-01-09 DIAGNOSIS — Z992 Dependence on renal dialysis: Secondary | ICD-10-CM | POA: Diagnosis not present

## 2020-01-09 DIAGNOSIS — N186 End stage renal disease: Secondary | ICD-10-CM | POA: Diagnosis not present

## 2020-01-09 DIAGNOSIS — D509 Iron deficiency anemia, unspecified: Secondary | ICD-10-CM | POA: Diagnosis not present

## 2020-01-10 DIAGNOSIS — Z992 Dependence on renal dialysis: Secondary | ICD-10-CM | POA: Diagnosis not present

## 2020-01-10 DIAGNOSIS — D509 Iron deficiency anemia, unspecified: Secondary | ICD-10-CM | POA: Diagnosis not present

## 2020-01-10 DIAGNOSIS — N186 End stage renal disease: Secondary | ICD-10-CM | POA: Diagnosis not present

## 2020-01-11 DIAGNOSIS — N186 End stage renal disease: Secondary | ICD-10-CM | POA: Diagnosis not present

## 2020-01-11 DIAGNOSIS — D509 Iron deficiency anemia, unspecified: Secondary | ICD-10-CM | POA: Diagnosis not present

## 2020-01-11 DIAGNOSIS — Z992 Dependence on renal dialysis: Secondary | ICD-10-CM | POA: Diagnosis not present

## 2020-01-12 DIAGNOSIS — Z992 Dependence on renal dialysis: Secondary | ICD-10-CM | POA: Diagnosis not present

## 2020-01-12 DIAGNOSIS — N186 End stage renal disease: Secondary | ICD-10-CM | POA: Diagnosis not present

## 2020-01-12 DIAGNOSIS — D509 Iron deficiency anemia, unspecified: Secondary | ICD-10-CM | POA: Diagnosis not present

## 2020-01-13 DIAGNOSIS — Z992 Dependence on renal dialysis: Secondary | ICD-10-CM | POA: Diagnosis not present

## 2020-01-13 DIAGNOSIS — D509 Iron deficiency anemia, unspecified: Secondary | ICD-10-CM | POA: Diagnosis not present

## 2020-01-13 DIAGNOSIS — N186 End stage renal disease: Secondary | ICD-10-CM | POA: Diagnosis not present

## 2020-01-14 ENCOUNTER — Inpatient Hospital Stay: Payer: Medicare Other

## 2020-01-14 ENCOUNTER — Other Ambulatory Visit: Payer: Self-pay

## 2020-01-14 ENCOUNTER — Observation Stay
Admission: EM | Admit: 2020-01-14 | Discharge: 2020-01-15 | Disposition: A | Discharge status: Home / Self Care | Payer: Medicare Other | Attending: Internal Medicine | Admitting: Internal Medicine

## 2020-01-14 ENCOUNTER — Inpatient Hospital Stay (HOSPITAL_BASED_OUTPATIENT_CLINIC_OR_DEPARTMENT_OTHER)
Admit: 2020-01-14 | Discharge: 2020-01-14 | Disposition: A | Discharge status: Home / Self Care | Payer: Medicare Other | Attending: Neurology | Admitting: Neurology

## 2020-01-14 ENCOUNTER — Emergency Department: Payer: Medicare Other

## 2020-01-14 DIAGNOSIS — D509 Iron deficiency anemia, unspecified: Secondary | ICD-10-CM | POA: Diagnosis not present

## 2020-01-14 DIAGNOSIS — I371 Nonrheumatic pulmonary valve insufficiency: Secondary | ICD-10-CM | POA: Diagnosis not present

## 2020-01-14 DIAGNOSIS — E1122 Type 2 diabetes mellitus with diabetic chronic kidney disease: Secondary | ICD-10-CM

## 2020-01-14 DIAGNOSIS — I2510 Atherosclerotic heart disease of native coronary artery without angina pectoris: Secondary | ICD-10-CM | POA: Insufficient documentation

## 2020-01-14 DIAGNOSIS — I6349 Cerebral infarction due to embolism of other cerebral artery: Secondary | ICD-10-CM

## 2020-01-14 DIAGNOSIS — E785 Hyperlipidemia, unspecified: Secondary | ICD-10-CM | POA: Diagnosis present

## 2020-01-14 DIAGNOSIS — N185 Chronic kidney disease, stage 5: Secondary | ICD-10-CM | POA: Diagnosis not present

## 2020-01-14 DIAGNOSIS — Z7982 Long term (current) use of aspirin: Secondary | ICD-10-CM | POA: Insufficient documentation

## 2020-01-14 DIAGNOSIS — Z79899 Other long term (current) drug therapy: Secondary | ICD-10-CM | POA: Diagnosis not present

## 2020-01-14 DIAGNOSIS — Z85038 Personal history of other malignant neoplasm of large intestine: Secondary | ICD-10-CM | POA: Diagnosis not present

## 2020-01-14 DIAGNOSIS — C61 Malignant neoplasm of prostate: Secondary | ICD-10-CM | POA: Diagnosis present

## 2020-01-14 DIAGNOSIS — F17210 Nicotine dependence, cigarettes, uncomplicated: Secondary | ICD-10-CM | POA: Insufficient documentation

## 2020-01-14 DIAGNOSIS — Z85528 Personal history of other malignant neoplasm of kidney: Secondary | ICD-10-CM | POA: Insufficient documentation

## 2020-01-14 DIAGNOSIS — I340 Nonrheumatic mitral (valve) insufficiency: Secondary | ICD-10-CM

## 2020-01-14 DIAGNOSIS — I351 Nonrheumatic aortic (valve) insufficiency: Secondary | ICD-10-CM

## 2020-01-14 DIAGNOSIS — N186 End stage renal disease: Secondary | ICD-10-CM | POA: Insufficient documentation

## 2020-01-14 DIAGNOSIS — Z8546 Personal history of malignant neoplasm of prostate: Secondary | ICD-10-CM | POA: Diagnosis not present

## 2020-01-14 DIAGNOSIS — I10 Essential (primary) hypertension: Secondary | ICD-10-CM

## 2020-01-14 DIAGNOSIS — C649 Malignant neoplasm of unspecified kidney, except renal pelvis: Secondary | ICD-10-CM | POA: Diagnosis present

## 2020-01-14 DIAGNOSIS — J441 Chronic obstructive pulmonary disease with (acute) exacerbation: Secondary | ICD-10-CM | POA: Insufficient documentation

## 2020-01-14 DIAGNOSIS — I69398 Other sequelae of cerebral infarction: Secondary | ICD-10-CM

## 2020-01-14 DIAGNOSIS — Z20822 Contact with and (suspected) exposure to covid-19: Secondary | ICD-10-CM | POA: Insufficient documentation

## 2020-01-14 DIAGNOSIS — I6623 Occlusion and stenosis of bilateral posterior cerebral arteries: Secondary | ICD-10-CM | POA: Diagnosis not present

## 2020-01-14 DIAGNOSIS — I739 Peripheral vascular disease, unspecified: Secondary | ICD-10-CM | POA: Diagnosis not present

## 2020-01-14 DIAGNOSIS — E7800 Pure hypercholesterolemia, unspecified: Secondary | ICD-10-CM

## 2020-01-14 DIAGNOSIS — I6523 Occlusion and stenosis of bilateral carotid arteries: Secondary | ICD-10-CM | POA: Diagnosis not present

## 2020-01-14 DIAGNOSIS — R070 Pain in throat: Secondary | ICD-10-CM | POA: Diagnosis not present

## 2020-01-14 DIAGNOSIS — R200 Anesthesia of skin: Secondary | ICD-10-CM | POA: Diagnosis not present

## 2020-01-14 DIAGNOSIS — E1169 Type 2 diabetes mellitus with other specified complication: Secondary | ICD-10-CM | POA: Diagnosis present

## 2020-01-14 DIAGNOSIS — I429 Cardiomyopathy, unspecified: Secondary | ICD-10-CM

## 2020-01-14 DIAGNOSIS — I639 Cerebral infarction, unspecified: Principal | ICD-10-CM

## 2020-01-14 DIAGNOSIS — I120 Hypertensive chronic kidney disease with stage 5 chronic kidney disease or end stage renal disease: Secondary | ICD-10-CM | POA: Diagnosis not present

## 2020-01-14 DIAGNOSIS — Z992 Dependence on renal dialysis: Secondary | ICD-10-CM | POA: Diagnosis not present

## 2020-01-14 DIAGNOSIS — I6503 Occlusion and stenosis of bilateral vertebral arteries: Secondary | ICD-10-CM | POA: Diagnosis not present

## 2020-01-14 HISTORY — DX: Disorder of kidney and ureter, unspecified: N28.9

## 2020-01-14 HISTORY — DX: Type 2 diabetes mellitus without complications: E11.9

## 2020-01-14 LAB — SEDIMENTATION RATE: Sed Rate: 49 mm/hr — ABNORMAL HIGH (ref 0–20)

## 2020-01-14 LAB — CBC
HCT: 32.6 % — ABNORMAL LOW (ref 39.0–52.0)
Hemoglobin: 11.6 g/dL — ABNORMAL LOW (ref 13.0–17.0)
MCH: 30.3 pg (ref 26.0–34.0)
MCHC: 35.6 g/dL (ref 30.0–36.0)
MCV: 85.1 fL (ref 80.0–100.0)
Platelets: 215 10*3/uL (ref 150–400)
RBC: 3.83 MIL/uL — ABNORMAL LOW (ref 4.22–5.81)
RDW: 14.6 % (ref 11.5–15.5)
WBC: 12.2 10*3/uL — ABNORMAL HIGH (ref 4.0–10.5)
nRBC: 0 % (ref 0.0–0.2)

## 2020-01-14 LAB — LIPID PANEL
Cholesterol: 173 mg/dL (ref 0–200)
HDL: 22 mg/dL — ABNORMAL LOW (ref >40)
LDL Cholesterol: 111 mg/dL — ABNORMAL HIGH (ref 0–99)
Total CHOL/HDL Ratio: 7.9 RATIO
Triglycerides: 202 mg/dL — ABNORMAL HIGH (ref <150)
VLDL: 40 mg/dL (ref 0–40)

## 2020-01-14 LAB — COMPREHENSIVE METABOLIC PANEL
ALT: 10 U/L (ref 0–44)
AST: 9 U/L — ABNORMAL LOW (ref 15–41)
Albumin: 3.3 g/dL — ABNORMAL LOW (ref 3.5–5.0)
Alkaline Phosphatase: 101 U/L (ref 38–126)
Anion gap: 14 (ref 5–15)
BUN: 62 mg/dL — ABNORMAL HIGH (ref 8–23)
CO2: 23 mmol/L (ref 22–32)
Calcium: 8.8 mg/dL — ABNORMAL LOW (ref 8.9–10.3)
Chloride: 102 mmol/L (ref 98–111)
Creatinine, Ser: 7.6 mg/dL — ABNORMAL HIGH (ref 0.61–1.24)
GFR calc Af Amer: 7 mL/min — ABNORMAL LOW (ref >60)
GFR calc non Af Amer: 6 mL/min — ABNORMAL LOW (ref >60)
Glucose, Bld: 121 mg/dL — ABNORMAL HIGH (ref 70–99)
Potassium: 3.7 mmol/L (ref 3.5–5.1)
Sodium: 139 mmol/L (ref 135–145)
Total Bilirubin: 0.9 mg/dL (ref 0.3–1.2)
Total Protein: 6.6 g/dL (ref 6.5–8.1)

## 2020-01-14 LAB — DIFFERENTIAL
Abs Immature Granulocytes: 0.06 10*3/uL (ref 0.00–0.07)
Basophils Absolute: 0.1 10*3/uL (ref 0.0–0.1)
Basophils Relative: 1 %
Eosinophils Absolute: 1.6 10*3/uL — ABNORMAL HIGH (ref 0.0–0.5)
Eosinophils Relative: 13 %
Immature Granulocytes: 1 %
Lymphocytes Relative: 16 %
Lymphs Abs: 1.9 10*3/uL (ref 0.7–4.0)
Monocytes Absolute: 0.7 10*3/uL (ref 0.1–1.0)
Monocytes Relative: 6 %
Neutro Abs: 7.9 10*3/uL — ABNORMAL HIGH (ref 1.7–7.7)
Neutrophils Relative %: 63 %

## 2020-01-14 LAB — PROTIME-INR
INR: 1 (ref 0.8–1.2)
Prothrombin Time: 12.7 seconds (ref 11.4–15.2)

## 2020-01-14 LAB — APTT: aPTT: 34 seconds (ref 24–36)

## 2020-01-14 LAB — SARS CORONAVIRUS 2 BY RT PCR (HOSPITAL ORDER, PERFORMED IN ~~LOC~~ HOSPITAL LAB): SARS Coronavirus 2: NEGATIVE

## 2020-01-14 LAB — TSH: TSH: 3.479 u[IU]/mL (ref 0.350–4.500)

## 2020-01-14 MED ORDER — STROKE: EARLY STAGES OF RECOVERY BOOK: Freq: Once | Status: DC

## 2020-01-14 MED ORDER — HYDRALAZINE HCL 20 MG/ML IJ SOLN
5.0000 mg | INTRAMUSCULAR | Status: DC | PRN
  Filled 2020-01-14: qty 1

## 2020-01-14 MED ORDER — AMLODIPINE BESYLATE 5 MG PO TABS
10.0000 mg | ORAL_TABLET | Freq: Every day | ORAL | Status: DC
  Administered 2020-01-15: 10 mg via ORAL
  Filled 2020-01-14: qty 2

## 2020-01-14 MED ORDER — ASPIRIN 300 MG RE SUPP
300.0000 mg | Freq: Once | RECTAL | Status: AC
  Administered 2020-01-14: 300 mg via RECTAL
  Filled 2020-01-14: qty 1

## 2020-01-14 MED ORDER — NICOTINE 14 MG/24HR TD PT24: 14.0000 mg | MEDICATED_PATCH | Freq: Every day | TRANSDERMAL | Status: DC

## 2020-01-14 MED ORDER — SODIUM CHLORIDE 0.9 % IV SOLN: INTRAVENOUS | Status: DC

## 2020-01-14 MED ORDER — ASPIRIN 81 MG PO CHEW
CHEWABLE_TABLET | ORAL | Status: AC
  Filled 2020-01-14: qty 4

## 2020-01-14 MED ORDER — ASPIRIN 81 MG PO CHEW: 324.0000 mg | CHEWABLE_TABLET | Freq: Once | ORAL | Status: DC

## 2020-01-14 MED ORDER — ENOXAPARIN SODIUM 40 MG/0.4ML ~~LOC~~ SOLN: 40.0000 mg | SUBCUTANEOUS | Status: DC

## 2020-01-14 MED ORDER — ASPIRIN EC 325 MG PO TBEC
325.0000 mg | DELAYED_RELEASE_TABLET | Freq: Every day | ORAL | Status: DC
  Administered 2020-01-15: 325 mg via ORAL
  Filled 2020-01-14: qty 1

## 2020-01-14 MED ORDER — LOSARTAN POTASSIUM 50 MG PO TABS
100.0000 mg | ORAL_TABLET | Freq: Every day | ORAL | Status: DC
  Administered 2020-01-15: 100 mg via ORAL
  Filled 2020-01-14: qty 2

## 2020-01-14 MED ORDER — ACETAMINOPHEN 160 MG/5ML PO SOLN
650.0000 mg | ORAL | Status: DC | PRN
  Filled 2020-01-14: qty 20.3

## 2020-01-14 MED ORDER — CLONIDINE HCL 0.1 MG PO TABS: 0.2000 mg | ORAL_TABLET | Freq: Three times a day (TID) | ORAL | Status: DC

## 2020-01-14 MED ORDER — SODIUM BICARBONATE 650 MG PO TABS
325.0000 mg | ORAL_TABLET | Freq: Three times a day (TID) | ORAL | Status: DC
  Administered 2020-01-15: 325 mg via ORAL
  Filled 2020-01-14 (×4): qty 0.5

## 2020-01-14 MED ORDER — CLONIDINE HCL 0.3 MG/24HR TD PTWK
0.6000 mg | MEDICATED_PATCH | TRANSDERMAL | Status: DC
  Administered 2020-01-14: 0.6 mg via TRANSDERMAL
  Filled 2020-01-14: qty 2

## 2020-01-14 MED ORDER — ASPIRIN 300 MG RE SUPP: 300.0000 mg | Freq: Every day | RECTAL | Status: DC

## 2020-01-14 MED ORDER — CARVEDILOL 6.25 MG PO TABS: 6.2500 mg | ORAL_TABLET | Freq: Two times a day (BID) | ORAL | Status: DC

## 2020-01-14 MED ORDER — ACETAMINOPHEN 325 MG PO TABS: 650.0000 mg | ORAL_TABLET | ORAL | Status: DC | PRN

## 2020-01-14 MED ORDER — LOSARTAN POTASSIUM 50 MG PO TABS: 100.0000 mg | ORAL_TABLET | Freq: Every day | ORAL | Status: DC

## 2020-01-14 MED ORDER — SODIUM CHLORIDE 0.9% FLUSH: 3.0000 mL | Freq: Once | INTRAVENOUS | Status: DC

## 2020-01-14 MED ORDER — ACETAMINOPHEN 650 MG RE SUPP: 650.0000 mg | RECTAL | Status: DC | PRN

## 2020-01-14 MED ORDER — CALCITRIOL 0.25 MCG PO CAPS: 0.2500 ug | ORAL_CAPSULE | ORAL | Status: DC

## 2020-01-14 MED ORDER — HEPARIN SODIUM (PORCINE) 5000 UNIT/ML IJ SOLN
5000.0000 [IU] | Freq: Three times a day (TID) | INTRAMUSCULAR | Status: DC
  Filled 2020-01-14: qty 1

## 2020-01-14 MED ORDER — ATORVASTATIN CALCIUM 20 MG PO TABS: 40.0000 mg | ORAL_TABLET | Freq: Every evening | ORAL | Status: DC

## 2020-01-14 MED ORDER — IOHEXOL 350 MG/ML SOLN
75.0000 mL | Freq: Once | INTRAVENOUS | Status: AC | PRN
  Administered 2020-01-14: 75 mL via INTRAVENOUS

## 2020-01-14 MED ORDER — CARVEDILOL 6.25 MG PO TABS
6.2500 mg | ORAL_TABLET | Freq: Two times a day (BID) | ORAL | Status: DC
  Administered 2020-01-15: 6.25 mg via ORAL
  Filled 2020-01-14: qty 1

## 2020-01-14 NOTE — ED Triage Notes (Signed)
Pt c/o numbness to the right side of head and arm and into the leg since yesterday evening, states he has generalized weakness but is feeling more weak on the right side, denies any pain. Pt has clear speech with no noted facial droop at present. Grips equal

## 2020-01-14 NOTE — H&P (Signed)
[name] History and Physical  [name] EGB:[id number] DOB: 1945/10/05 DOA: 01/14/2020  Referring physician: Dr. Kerman Passey PCP: Crecencio Mc, MD   Chief Complaint: numbness  HPI: [name] is a 74 y.o. male with history of pretension, hyperlipidemia, cardiomyopathy, renal cell carcinoma status post heminephrectomy, ESRD on peritoneal dialysis, tobacco use disorder, and extensive atherosclerotic disease, who presents with numbness and tingling.  She reports that last night around 6:00 PM he began to notice numbness and tingling extending from his right neck down through his right arm.  He also experienced not quite weakness but a feeling like the fingers in his right hand could not quite work.  He took a handful of baby aspirin last night and went to sleep.  This morning he woke up and felt the same and decided to present to the hospital for evaluation.  He currently reports ongoing numbness in his right neck but his other symptoms have mostly resolved.  Denies any chest pain or shortness of breath.  In the ED initial vital signs notable for mild bradycardia into the 50s.  And hypertension with blood pressures ranging 160s to low 200s over 90s.  His lab work-up was notable for CMP showing creatinine of 7.6, CBC with mild anemia of 11.6, normal coags.  He underwent a CT head which showed no acute intracranial pathology, followed by an MRI brain which showed multiple small acute infarcts of both the left and right lobes of the brain.  EKG did not show ST changes but there were new T wave inversions in V5 and V6.  Neurology was subsequently consulted and obtained CT angio neck and head which showed extensive atherosclerotic disease throughout the aortic arch and diffuse mild stenoses.  Also noted short segment dissection of the proximal left subclavian artery without significant stenosis.  Neurology provided initial recommendations, and also recommended consultation with  cardiology regarding anticoagulation management plan.  Review of Systems:  Pertinent positives and negative per HPI, all others reviewed and negative   Past Medical History:  Diagnosis Date  . 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 Cardiomyopathy ([place])    a. 12/2018 Echo: EF 40-45%, global HK. Nl RV fxn. Mild-mod LAE. Mild AI, Mild-mod MR. Asc Ao 3.7cm; b. 01/2019 [facility]i MV: small, mild, fixed basal and mid antlat defect - scar vs artifact. Small, mild mid and apical inf minimally reversible defect, likely scar w/ peri-infarct ischemia. Coronary and Ao atherosclerosis.  . Complication of anesthesia    had to be woken up slowly as his bp was elevated when did this quickly  . Diabetes mellitus without complication ([place])   . ESRD (end stage renal disease) ([place])    a. Peritoneal Dialysis pt.  . Hyperlipidemia   . Hypertension   . Migraine    cluster  . Neuromuscular disorder ([place])    left lower extrem neuropathy  . Obstructive sleep apnea    no OSA since had facial surgery with dr. Kathyrn Sheriff in 1997  . PAD (peripheral artery disease) [facility]) Feb 2009   nonobstructing, renal angiogram ([place])  . Renal cell carcinoma 2004   left kidney heminephrectomy  . Renal insufficiency   . TIA (transient ischemic attack)    no residual but left leg and foot still feel heavy  . tobacco abuse    Past Surgical History:  Procedure Laterality Date  . CAPD INSERTION N/A 03/01/2019   Procedure: LAPAROSCOPIC INSERTION CONTINUOUS AMBULATORY PERITONEAL DIALYSIS  (CAPD) CATHETER;  Surgeon: Algernon Huxley, MD;  Location: [facility] ORS;  Service: Vascular;  Laterality: N/A;  . CARDIAC CATHETERIZATION     Dr. [name] did this to assess his renal artery  . cyst removal  12/25/2015   Spine L4 and L5  . heminephrectomy  2004   for renal cell CA  . RENAL CRYOABLATION  Jan 2006   right kidney,  [name]  . sciatica     Social History:  reports that he has been smoking cigarettes. He has  a 50.00 pack-year smoking history. He has never used smokeless tobacco. He reports previous alcohol use. He reports that he does not use drugs.  Allergies  Allergen Reactions  . Irbesartan Other (See Comments)    hyperkalemia  . Cymbalta [Duloxetine Hcl]   . Atorvastatin Other (See Comments)    Muscle pain  . Bystolic [Nebivolol Hcl]     Extreme fatigue     Family History  Problem Relation Age of Onset  . Hypertension Mother   . Cancer Mother        breast  . Aneurysm Mother   . Coronary artery disease Father   . Hypertension Father   . Stroke Father 32  . Heart disease Father   . Heart attack Father 79  . Aneurysm Maternal Grandmother        brain  . Aneurysm Paternal Grandmother        brain  . Coronary artery disease Paternal Grandfather   . Heart disease Brother        valvular heart disease  . COPD Brother   . Hypertension Brother   . Stroke Paternal Uncle     Prior to Admission medications   Medication Sig Start Date End Date Taking? Authorizing Provider  amLODipine (NORVASC) 10 MG tablet TAKE 1 TABLET BY MOUTH ONCE DAILY 06/04/19  Yes Crecencio Mc, MD  losartan (COZAAR) 100 MG tablet Take 100 mg by mouth daily. 04/11/19  Yes [provider]  albuterol (VENTOLIN HFA) 108 (90 Base) MCG/ACT inhaler Inhale 2 puffs into the lungs every 6 (six) hours as needed for wheezing or shortness of breath. 04/12/19   Crecencio Mc, MD  aspirin 81 MG tablet Take 1 tablet (81 mg total) by mouth daily. 05/16/14   Crecencio Mc, MD  calcitRIOL (ROCALTROL) 0.25 MCG capsule Take 0.25 mcg by mouth daily. Take one capsule only on Monday / Wednesday / FRIDAY 03/04/18   [provider]  carvedilol (COREG) 6.25 MG tablet Take 6.25 mg by mouth 2 (two) times daily with a meal.  07/07/17   [provider]  cephALEXin (KEFLEX) 500 MG capsule Take 500 mg by mouth as needed.  04/11/19   [provider]  cloNIDine (CATAPRES) 0.2 MG tablet Take 1 tablet (0.2  mg total) by mouth 3 (three) times daily. 01/03/19   End, Harrell Gave, MD  diphenhydrAMINE (BENADRYL) 25 MG tablet Take 25 mg by mouth every 6 (six) hours as needed.    [provider]  doxazosin (CARDURA) 2 MG tablet Take 1 tablet (2 mg total) by mouth daily. 05/09/19   Theora Gianotti, NP  ezetimibe (ZETIA) 10 MG tablet Take 1 tablet (10 mg total) by mouth daily. 05/09/19 08/07/19  Theora Gianotti, NP  ferrous sulfate 325 (65 FE) MG tablet Take 1 tablet (325 mg total) by mouth daily. With orange juice 07/26/17   Crecencio Mc, MD  fluticasone (FLONASE) 50 MCG/ACT nasal spray Place 2 sprays into both nostrils daily.  05/29/18   Crecencio Mc, MD  gabapentin (NEURONTIN) 100 MG capsule TAKE 1 CAPSULE BY MOUTH 3 TIMES DAILY 10/15/19   Crecencio Mc, MD  Lactulose 20 GM/30ML SOLN 30 ml daily for constipation 05/11/19   Crecencio Mc, MD  sodium bicarbonate 325 MG tablet TK 1 T PO BID 02/14/18   [provider]  traMADol (ULTRAM) 50 MG tablet Take 1 tablet (50 mg total) by mouth 2 (two) times daily. Patient taking differently: Take 50 mg by mouth 2 (two) times daily as needed.  04/12/19   Crecencio Mc, MD   Physical Exam: Vitals:   01/14/20 1507 01/14/20 1515 01/14/20 1530 01/14/20 1850  BP: (!) 204/84  (!) 191/83 (!) 200/68  Pulse: (!) 54 (!) 55 (!) 55 (!) 52  Resp: (!) 25 20 18  (!) 25  Temp:      TempSrc:      SpO2: 99% 100% 98% 98%  Weight:      Height:        Wt Readings from Last 3 Encounters:  01/14/20 63.5 kg  05/11/19 62.8 kg  05/09/19 62.7 kg    General:  Appears calm and comfortable Eyes: PERRL, normal lids, irises & conjunctiva ENT: grossly normal hearing, lips & tongue Neck: no LAD, masses or thyromegaly Cardiovascular: RRR, no m/r/g. No LE edema. Respiratory: CTA bilaterally, no w/r/r. Normal respiratory effort. Abdomen: soft, ntnd Skin: no rash or induration seen on limited exam Musculoskeletal: grossly normal tone  BUE/BLE Psychiatric: grossly normal mood and affect, speech fluent and appropriate Neurologic: grossly non-focal. Strength 5/5 in UE/LE bilaterally. Reports defect in sensation in R neck but otherwise intact to light touch throughout.           Labs on Admission:  Basic Metabolic Panel: Recent Labs  Lab 01/14/20 0813  NA 139  K 3.7  CL 102  CO2 23  GLUCOSE 121*  BUN 62*  CREATININE 7.60*  CALCIUM 8.8*   Liver Function Tests: Recent Labs  Lab 01/14/20 0813  AST 9*  ALT 10  ALKPHOS 101  BILITOT 0.9  PROT 6.6  ALBUMIN 3.3*   No results for input(s): LIPASE, AMYLASE in the last 168 hours. No results for input(s): AMMONIA in the last 168 hours. CBC: Recent Labs  Lab 01/14/20 0813  WBC 12.2*  NEUTROABS 7.9*  HGB 11.6*  HCT 32.6*  MCV 85.1  PLT 215   Cardiac Enzymes: No results for input(s): CKTOTAL, CKMB, CKMBINDEX, TROPONINI in the last 168 hours.  BNP (last 3 results) No results for input(s): BNP in the last 8760 hours.  ProBNP (last 3 results) No results for input(s): PROBNP in the last 8760 hours.  CBG: No results for input(s): GLUCAP in the last 168 hours.  Radiological Exams on Admission: CT HEAD WO CONTRAST  Result Date: 01/14/2020 CLINICAL DATA:  Home with numbness on right side to rib cage, neck and shoulder since 6pm last night. Pt did his in home dialysis last pm. EXAM: CT HEAD WITHOUT CONTRAST TECHNIQUE: Contiguous axial images were obtained from the base of the skull through the vertex without intravenous contrast. COMPARISON:  01/03/2012 FINDINGS: Brain: No evidence of acute infarction, hemorrhage, extra-axial collection, ventriculomegaly, or mass effect. Generalized cerebral atrophy. Periventricular white matter low attenuation likely secondary to microangiopathy. Vascular: Cerebrovascular atherosclerotic calcifications are noted. Skull: Negative for fracture or focal lesion. Sinuses/Orbits: Visualized portions of the orbits are unremarkable.  Visualized portions of the paranasal sinuses are unremarkable. Visualized portions of the mastoid air  cells are unremarkable. Other: None. IMPRESSION: 1. No acute intracranial pathology. 2. Chronic microvascular disease and cerebral atrophy. Electronically Signed   By: Kathreen Devoid   On: 01/14/2020 09:07   MR BRAIN WO CONTRAST  Result Date: 01/14/2020 CLINICAL DATA:  Right-sided numbness EXAM: MRI HEAD WITHOUT CONTRAST TECHNIQUE: Multiplanar, multiecho pulse sequences of the brain and surrounding structures were obtained without intravenous contrast. COMPARISON:  None. FINDINGS: Brain: There are scattered small foci of reduced diffusion in the left cerebral hemisphere involving expected MCA and PCA territories as well as the posterior right temporal lobe. There is no intracranial mass, mass effect, or edema. There is no hydrocephalus or extra-axial fluid collection. Prominence of the ventricles and sulci reflects minor generalized parenchymal volume loss. Patchy and confluent areas of T2 hyperintensity in the supratentorial white matter nonspecific but probably reflect mild to moderate chronic microvascular ischemic changes. Chronic infarcts of the right basal ganglia. Chronic left occipital infarct with chronic blood products. Small chronic left cerebellar infarct. Scattered small foci of susceptibility hypointensity in the cerebral white matter most compatible with chronic microhemorrhages. Vascular: Major vessel flow voids at the skull base are preserved. Skull and upper cervical spine: Normal marrow signal is preserved. Sinuses/Orbits: Paranasal sinuses are aerated. Orbits are unremarkable. Other: Sella is unremarkable.  Mastoid air cells are clear. IMPRESSION: Small acute infarcts involving left cerebral hemisphere (MCA and PCA territories) as well as posterior right temporal lobe. Several chronic infarcts as described. Mild to moderate chronic microvascular ischemic changes. Minor burden of chronic  microhemorrhages. Electronically Signed   By: Macy Mis M.D.   On: 01/14/2020 14:47   CT ANGIO HEAD CODE STROKE  Result Date: 01/14/2020 CLINICAL DATA:  Acute neuro deficit. Right-sided numbness. Acute infarct on MRI. EXAM: CT ANGIOGRAPHY HEAD AND NECK TECHNIQUE: Multidetector CT imaging of the head and neck was performed using the standard protocol during bolus administration of intravenous contrast. Multiplanar CT image reconstructions and MIPs were obtained to evaluate the vascular anatomy. Carotid stenosis measurements (when applicable) are obtained utilizing NASCET criteria, using the distal internal carotid diameter as the denominator. CONTRAST:  42mL OMNIPAQUE IOHEXOL 350 MG/ML SOLN COMPARISON:  CT head and MRI head 01/14/2020 FINDINGS: CTA NECK FINDINGS Aortic arch: Extensive atherosclerotic disease in the aortic arch without aneurysm. Atherosclerotic disease in the proximal great vessels without significant stenosis. Short segment dissection of the proximal left subclavian artery without stenosis. Right carotid system: Atherosclerotic disease throughout the right common carotid artery and right carotid bifurcation. No significant stenosis. Left carotid system: Atherosclerotic disease throughout the left common carotid artery and left carotid bifurcation. Calcified and noncalcified plaque in the proximal left internal carotid artery without significant stenosis. Vertebral arteries: Mild stenosis proximal right vertebral artery which is patent to the basilar. Mild stenosis proximal left vertebral artery. Moderate calcific stenosis distal left vertebral artery at the skull base. Skeleton: Prominent degenerative changes in the facet joints on the left. No acute skeletal abnormality. Other neck: No mass or adenopathy in the neck. Upper chest: Apical emphysema without acute abnormality. Review of the MIP images confirms the above findings CTA HEAD FINDINGS Anterior circulation: Mild atherosclerotic  calcification cavernous carotid bilaterally without significant stenosis. Anterior and middle cerebral arteries patent without significant stenosis or large vessel occlusion. Posterior circulation: Moderate stenosis left vertebral artery at the skull base. Both vertebral arteries are patent to the basilar. PICA patent bilaterally. Basilar patent without stenosis. AICA, superior cerebellar, and posterior cerebral arteries are patent. Mild stenosis in the P2 segment bilaterally. Venous  sinuses: Normal venous enhancement Anatomic variants: None Review of the MIP images confirms the above findings IMPRESSION: 1. Extensive atherosclerotic disease. Aortic Atherosclerosis (ICD10-I70.0). 2. Atherosclerotic disease the carotid bifurcation bilaterally without significant stenosis. 3. Mild stenosis in the proximal vertebral artery bilaterally. Moderate calcific stenosis distal left vertebral artery. 4. Negative for intracranial large vessel occlusion or flow limiting stenosis. Mild stenosis in the P1 segment bilaterally. 5. 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 6. Preliminary report texted to Dr. Curly Shores Electronically Signed   By: Franchot Gallo M.D.   On: 01/14/2020 16:42   CT ANGIO NECK CODE STROKE  Result Date: 01/14/2020 CLINICAL DATA:  Acute neuro deficit. Right-sided numbness. Acute infarct on MRI. EXAM: CT ANGIOGRAPHY HEAD AND NECK TECHNIQUE: Multidetector CT imaging of the head and neck was performed using the standard protocol during bolus administration of intravenous contrast. Multiplanar CT image reconstructions and MIPs were obtained to evaluate the vascular anatomy. Carotid stenosis measurements (when applicable) are obtained utilizing NASCET criteria, using the distal internal carotid diameter as the denominator. CONTRAST:  2mL OMNIPAQUE IOHEXOL 350 MG/ML SOLN COMPARISON:  CT head and MRI head 01/14/2020 FINDINGS: CTA NECK FINDINGS Aortic arch: Extensive  atherosclerotic disease in the aortic arch without aneurysm. Atherosclerotic disease in the proximal great vessels without significant stenosis. Short segment dissection of the proximal left subclavian artery without stenosis. Right carotid system: Atherosclerotic disease throughout the right common carotid artery and right carotid bifurcation. No significant stenosis. Left carotid system: Atherosclerotic disease throughout the left common carotid artery and left carotid bifurcation. Calcified and noncalcified plaque in the proximal left internal carotid artery without significant stenosis. Vertebral arteries: Mild stenosis proximal right vertebral artery which is patent to the basilar. Mild stenosis proximal left vertebral artery. Moderate calcific stenosis distal left vertebral artery at the skull base. Skeleton: Prominent degenerative changes in the facet joints on the left. No acute skeletal abnormality. Other neck: No mass or adenopathy in the neck. Upper chest: Apical emphysema without acute abnormality. Review of the MIP images confirms the above findings CTA HEAD FINDINGS Anterior circulation: Mild atherosclerotic calcification cavernous carotid bilaterally without significant stenosis. Anterior and middle cerebral arteries patent without significant stenosis or large vessel occlusion. Posterior circulation: Moderate stenosis left vertebral artery at the skull base. Both vertebral arteries are patent to the basilar. PICA patent bilaterally. Basilar patent without stenosis. AICA, superior cerebellar, and posterior cerebral arteries are patent. Mild stenosis in the P2 segment bilaterally. Venous sinuses: Normal venous enhancement Anatomic variants: None Review of the MIP images confirms the above findings IMPRESSION: 1. Extensive atherosclerotic disease. Aortic Atherosclerosis (ICD10-I70.0). 2. Atherosclerotic disease the carotid bifurcation bilaterally without significant stenosis. 3. Mild stenosis in the  proximal vertebral artery bilaterally. Moderate calcific stenosis distal left vertebral artery. 4. Negative for intracranial large vessel occlusion or flow limiting stenosis. Mild stenosis in the P1 segment bilaterally. 5. 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 6. Preliminary report texted to Dr. Curly Shores Electronically Signed   By: Franchot Gallo M.D.   On: 01/14/2020 16:42    EKG: Independently reviewed.  Sinus bradycardia, poor R wave progression, new T wave inversion in V5 and V6 only new changes compared to prior.  Assessment/Plan Active Problems:   Hyperlipidemia   History of renal cell carcinoma   PAD (peripheral artery disease) ([facility])   Prostate CA ([facility])   Adenocarcinoma, renal cell ([facility])   DM (diabetes mellitus), type 2 with renal complications ([facility])   CKD (chronic kidney disease),  stage V ([place])   Cardiomyopathy ([place])   ESRD (end stage renal disease) ([place])   Stroke ([place])   #Acute bilateral strokes Patient presenting with acute numbness and dysfunction of right upper extremity, found to have acute multifocal strokes on MRI.  Appreciate neurology consultation and recommendations.  Given significant showering of infarcts neurology considering DAPT versus anticoagulation, but given extensive atherosclerotic findings in the aortic arch would like cardiology's input prior to proceeding with full anticoagulation. -Cardiology paged to discuss anticoagulation plan for patient -Follow-up echocardiogram -PT and OT consults -Speech consult -Follow-up labs per neurology -Telemetry -Atorvastatin 40 mg  #Subclavian dissection Short segment dissection of the proximal left subclavian artery seen on CTA of head and neck.  Acute chatted with Dr. Laurence Compton from vascular surgery, he does not believe that it is large enough to impede flow or if in the cause of symptoms.  Vascular surgery will see patient nonurgently. -Follow-up vascular surgery  recommendations  #ESRD Patient on nightly peritoneal dialysis, nephrology aware of patient (Dr Holley Raring secure chatted) and will assist with peritoneal dialysis.  #Hypertension Poorly controlled since arrival but has not taken some of his daily meds.  Continue amlodipine, carvedilol, clonidine, losartan, give dose of IV hydralazine right now as well.    Code Status: Full Code, confirmed DVT Prophylaxis: pending discussion w cardiology Family Communication: Wife Donte Lenzo updated by phone [phone]  Disposition Plan: Inpatient  Time spent: 79 min  Clarnce Flock MD/[name]

## 2020-01-14 NOTE — ED Notes (Signed)
This RN messaged MD in regards to switching PO meds to IV due to pt remaining NPO

## 2020-01-14 NOTE — ED Provider Notes (Signed)
[facility] Emergency Department Provider Note  Time seen: 1:19 PM  I have reviewed the triage vital signs and the nursing notes.   HISTORY  Chief Complaint Numbness and Weakness   HPI [name] is a 74 y.o. male with a past medical history of cardiomyopathy, ESRD on peritoneal dialysis, hypertension, hyperlipidemia, TIA, presents to the emergency department for right-sided numbness.  According to the patient since yesterday he has been experiencing a numbness sensation of his right face arm and flank.  Patient states the arm feels somewhat back to normal but he continues to have subjective numbness in the right face and flank.  Patient denies any headache.  No neck or back pain.  Denies any fever.  Largely negative review of systems.  Patient does state a prior history of a TIA with left-sided deficits but that has since resolved.   Past Medical History:  Diagnosis Date  . 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 Cardiomyopathy ([place])    a. 12/2018 Echo: EF 40-45%, global HK. Nl RV fxn. Mild-mod LAE. Mild AI, Mild-mod MR. Asc Ao 3.7cm; b. 01/2019 Lexi MV: small, mild, fixed basal and mid antlat defect - scar vs artifact. Small, mild mid and apical inf minimally reversible defect, likely scar w/ peri-infarct ischemia. Coronary and Ao atherosclerosis.  . Complication of anesthesia    had to be woken up slowly as his bp was elevated when did this quickly  . ESRD (end stage renal disease) ([place])    a. Peritoneal Dialysis pt.  . Hyperlipidemia   . Hypertension   . Migraine    cluster  . Neuromuscular disorder ([place])    left lower extrem neuropathy  . Obstructive sleep apnea    no OSA since had facial surgery with dr. Kathyrn Sheriff in 1997  . PAD (peripheral artery disease) [facility]) Feb 2009   nonobstructing, renal angiogram ([place])  . Renal cell carcinoma 2004   left kidney heminephrectomy  . TIA (transient ischemic attack)    no  residual but left leg and foot still feel heavy  . tobacco abuse     Patient Active Problem List   Diagnosis Date Noted  . Constipation 05/13/2019  . Cardiomyopathy ([place]) 04/26/2019  . Coronary artery disease involving native coronary artery of native heart without angina pectoris 04/26/2019  . ESRD (end stage renal disease) ([place]) 04/26/2019  . Chronic pain not due to malignancy 04/12/2019  . Chronic bilateral low back pain with bilateral sciatica 11/27/2018  . Synovial cyst of lumbar spine 11/27/2018  . Hypertension 11/14/2018  . CKD (chronic kidney disease), stage V ([place]) 11/14/2018  . Major depressive disorder with current active episode 07/26/2017  . Anemia of chronic kidney failure, stage 4 (severe) ([place]) 07/26/2017  . COPD exacerbation ([place]) 07/26/2017  . CKD (chronic kidney disease), stage IV ([place]) 11/25/2016  . DOE (dyspnea on exertion) 11/25/2016  . Sciatica of right side 07/20/2015  . DM (diabetes mellitus), type 2 with renal complications ([place]) 76/22/6333  . S/P UVPP (uvulopalatopharyngoplasty) 08/22/2014  . Presbyacusis 08/22/2014  . Adenocarcinoma, renal cell ([place]) 02/26/2014  . Elevated alkaline phosphatase measurement 07/30/2013  . Prostate CA ([place]) 07/30/2013  . CA of prostate ([place]) 07/30/2013  . Statin intolerance 04/26/2013  . Hypertensive renal sclerosis with hypertension 10/18/2012  . PAD (peripheral artery disease) ([place])   . Left hip pain 04/20/2012  . Erectile dysfunction 04/20/2012  . tobacco abuse   . TIA (transient ischemic attack) 01/15/2012  .  Tobacco abuse counseling 05/06/2011  . Hyperlipidemia   . History of renal cell carcinoma     Past Surgical History:  Procedure Laterality Date  . CAPD INSERTION N/A 03/01/2019   Procedure: LAPAROSCOPIC INSERTION CONTINUOUS AMBULATORY PERITONEAL DIALYSIS  (CAPD) CATHETER;  Surgeon: Algernon Huxley, MD;  Location: [facility] ORS;  Service: Vascular;  Laterality: N/A;  . CARDIAC CATHETERIZATION     Dr. [name] did this  to assess his renal artery  . cyst removal  12/25/2015   Spine L4 and L5  . heminephrectomy  2004   for renal cell CA  . RENAL CRYOABLATION  Jan 2006   right kidney,  [name]  . sciatica      Prior to Admission medications   Medication Sig Start Date End Date Taking? Authorizing Provider  albuterol (VENTOLIN HFA) 108 (90 Base) MCG/ACT inhaler Inhale 2 puffs into the lungs every 6 (six) hours as needed for wheezing or shortness of breath. 04/12/19   Crecencio Mc, MD  amLODipine (NORVASC) 10 MG tablet TAKE 1 TABLET BY MOUTH ONCE DAILY 06/04/19   Crecencio Mc, MD  aspirin 81 MG tablet Take 1 tablet (81 mg total) by mouth daily. 05/16/14   Crecencio Mc, MD  calcitRIOL (ROCALTROL) 0.25 MCG capsule Take 0.25 mcg by mouth daily. Take one capsule only on Monday / Wednesday / FRIDAY 03/04/18   [provider]  carvedilol (COREG) 6.25 MG tablet Take 6.25 mg by mouth 2 (two) times daily with a meal.  07/07/17   [provider]  cephALEXin (KEFLEX) 500 MG capsule Take 500 mg by mouth as needed.  04/11/19   [provider]  cloNIDine (CATAPRES) 0.2 MG tablet Take 1 tablet (0.2 mg total) by mouth 3 (three) times daily. 01/03/19   End, Harrell Gave, MD  diphenhydrAMINE (BENADRYL) 25 MG tablet Take 25 mg by mouth every 6 (six) hours as needed.    [provider]  doxazosin (CARDURA) 2 MG tablet Take 1 tablet (2 mg total) by mouth daily. 05/09/19   Theora Gianotti, NP  ezetimibe (ZETIA) 10 MG tablet Take 1 tablet (10 mg total) by mouth daily. 05/09/19 08/07/19  Theora Gianotti, NP  ferrous sulfate 325 (65 FE) MG tablet Take 1 tablet (325 mg total) by mouth daily. With orange juice 07/26/17   Crecencio Mc, MD  fluticasone (FLONASE) 50 MCG/ACT nasal spray Place 2 sprays into both nostrils daily. 05/29/18   Crecencio Mc, MD  gabapentin (NEURONTIN) 100 MG capsule TAKE 1 CAPSULE BY MOUTH 3 TIMES DAILY 10/15/19   Crecencio Mc, MD  Lactulose 20 GM/30ML  SOLN 30 ml daily for constipation 05/11/19   Crecencio Mc, MD  losartan (COZAAR) 100 MG tablet Take 100 mg by mouth daily. 04/11/19   [provider]  sodium bicarbonate 325 MG tablet TK 1 T PO BID 02/14/18   [provider]  traMADol (ULTRAM) 50 MG tablet Take 1 tablet (50 mg total) by mouth 2 (two) times daily. Patient taking differently: Take 50 mg by mouth 2 (two) times daily as needed.  04/12/19   Crecencio Mc, MD    Allergies  Allergen Reactions  . Irbesartan Other (See Comments)    hyperkalemia  . Cymbalta [Duloxetine Hcl]   . Atorvastatin Other (See Comments)    Muscle pain  . Bystolic [Nebivolol Hcl]     Extreme fatigue     Family History  Problem Relation Age of Onset  . Hypertension Mother   .  Cancer Mother        breast  . Aneurysm Mother   . Coronary artery disease Father   . Hypertension Father   . Stroke Father 51  . Heart disease Father   . Heart attack Father 67  . Aneurysm Maternal Grandmother        brain  . Aneurysm Paternal Grandmother        brain  . Coronary artery disease Paternal Grandfather   . Heart disease Brother        valvular heart disease  . COPD Brother   . Hypertension Brother   . Stroke Paternal Uncle     Social History Social History   Tobacco Use  . Smoking status: Current Every Day Smoker    Packs/day: 1.00    Years: 50.00    Pack years: 50.00    Types: Cigarettes  . Smokeless tobacco: Never Used  Vaping Use  . Vaping Use: Former  . Devices: tried but did not like  Substance Use Topics  . Alcohol use: Not Currently  . Drug use: No    Review of Systems Constitutional: Negative for fever. Cardiovascular: Negative for chest pain. Respiratory: Negative for shortness of breath. Gastrointestinal: Negative for abdominal pain Musculoskeletal: Negative for musculoskeletal complaints Neurological: Negative for headache.  Right-sided numbness. All other ROS  negative  ____________________________________________   PHYSICAL EXAM:  VITAL SIGNS: ED Triage Vitals  Enc Vitals Group     BP 01/14/20 0810 (!) 161/93     Pulse Rate 01/14/20 0810 (!) 58     Resp 01/14/20 0810 17     Temp 01/14/20 0810 97.7 F (36.5 C)     Temp Source 01/14/20 0810 Oral     SpO2 01/14/20 0810 97 %     Weight 01/14/20 0811 140 lb (63.5 kg)     Height 01/14/20 0811 5\' 6"  (1.676 m)     Head Circumference --      Peak Flow --      Pain Score 01/14/20 0811 0     Pain Loc --      Pain Edu? --      Excl. in [place]? --     Constitutional: Alert and oriented. Well appearing and in no distress. Eyes: Normal exam ENT      Head: Normocephalic and atraumatic.      Mouth/Throat: Mucous membranes are moist. Cardiovascular: Normal rate, regular rhythm. Respiratory: Normal respiratory effort without tachypnea nor retractions. Breath sounds are clear  Gastrointestinal: Soft and nontender. No distention.  Peritoneal dialysis catheter present. Musculoskeletal: Nontender with normal range of motion in all extremities. Neurologic:  Normal speech and language. No gross focal neurologic deficits.  Does state subjective numbness to the right face and right flank.  Equal grip strength bilaterally.  No pronator drift.  5/5 motor in lower extremities with equal sensation. Skin:  Skin is warm, dry and intact.  Psychiatric: Mood and affect are normal. Speech and behavior are normal.   ____________________________________________    EKG  EKG viewed and interpreted by myself shows a normal sinus rhythm at 53 bpm with a narrow QRS, left axis deviation, largely normal intervals with nonspecific ST changes.  ____________________________________________    RADIOLOGY  CT head is negative for acute abnormality.  MRI positive for several small acute infarcts in left cerebral hemisphere.  As well as right posterior temporal  lobe.  ____________________________________________   INITIAL IMPRESSION / ASSESSMENT AND PLAN / ED COURSE  Pertinent labs & imaging results that  were available during my care of the patient were reviewed by me and considered in my medical decision making (see chart for details).   Patient presents to the emergency department for right-sided numbness since yesterday.  Overall the patient appears well does state subjective numbness to the right side of his face and right flank on examination.  Otherwise no focal deficits identified.  Patient's lab work is largely within normal limits/baseline, CT scan is negative.  However given the patient's findings on neurological examination we will proceed with MR imaging of the brain to further evaluate.  Patient agreeable to plan of care.  MRI concerning for possible embolic showering given multiple small acute infarcts.  Given this finding patient will be admitted to the hospital service for further work-up and treatment.  Patient agreeable to plan of care.  NIH Stroke Scale   Interval: Baseline Time: 2:55 PM Person Administering Scale: Harvest Dark  Administer stroke scale items in the order listed. Record performance in each category after each subscale exam. Do not go back and change scores. Follow directions provided for each exam technique. Scores should reflect what the patient does, not what the clinician thinks the patient can do. The clinician should record answers while administering the exam and work quickly. Except where indicated, the patient should not be coached (i.e., repeated requests to patient to make a special effort).   1a  Level of consciousness: 0=alert; keenly responsive  1b. LOC questions:  0=Performs both tasks correctly  1c. LOC commands: 0=Performs both tasks correctly  2.  Best Gaze: 0=normal  3.  Visual: 0=No visual loss  4. Facial Palsy: 0=Normal symmetric movement  5a.  Motor left arm: 0=No drift, limb holds 90  (or 45) degrees for full 10 seconds  5b.  Motor right arm: 0=No drift, limb holds 90 (or 45) degrees for full 10 seconds  6a. motor left leg: 0=No drift, limb holds 90 (or 45) degrees for full 10 seconds  6b  Motor right leg:  0=No drift, limb holds 90 (or 45) degrees for full 10 seconds  7. Limb Ataxia: 0=Absent  8.  Sensory: 1=Mild to moderate sensory loss; patient feels pinprick is less sharp or is dull on the affected side; there is a loss of superficial pain with pinprick but patient is aware He is being touched  9. Best Language:  0=No aphasia, normal  10. Dysarthria: 0=Normal  11. Extinction and Inattention: 0=No abnormality  12. Distal motor function: 0=Normal   Total:   1    Samay L Launer was evaluated in Emergency Department on 01/14/2020 for the symptoms described in the history of present illness. He was evaluated in the context of the global COVID-19 pandemic, which necessitated consideration that the patient might be at risk for infection with the SARS-CoV-2 virus that causes COVID-19. Institutional protocols and algorithms that pertain to the evaluation of patients at risk for COVID-19 are in a state of rapid change based on information released by regulatory bodies including the CDC and federal and state organizations. These policies and algorithms were followed during the patient's care in the ED.  ____________________________________________   FINAL CLINICAL IMPRESSION(S) / ED DIAGNOSES  Acute CVA   Harvest Dark, MD 01/14/20 1512

## 2020-01-14 NOTE — ED Triage Notes (Signed)
Pt in via EMS from home with c/o numbness on right side to rib cage, neck and shoulder since 6pm last night. Pt did his in home dialysis last pm. Pt reports a week ago had a left sided sore throat. #20 g to left forearm, CBG 114, 159/81, HR 64, 98% RA

## 2020-01-14 NOTE — ED Notes (Signed)
Admitting MD at bedside.

## 2020-01-14 NOTE — ED Notes (Signed)
Pt transported to MRI 

## 2020-01-14 NOTE — Consult Note (Addendum)
Neurology Consultation Reason for Consult: Right sided numbness, stroke on MRI Referring Physician: Elton Sin  CC: Numbness on the right side.   History is obtained from: chart review and patient  HPI: [name] is a 74 y.o. male man with past medical history significant for diabetes (A1c 5.7%) hypertension, hyperlipidemia, cardiomyopathy (8/20 echo EF 40 to 45%, with some evidence of likely prior ischemia), renal cell carcinoma of left kidney heminephrectomy complicated by end-stage renal disease on peritoneal dialysis, ongoing tobacco abuse (1 pack/day) for coronary disease, headache.  On 8/22 at about 6 PM he had onset of numbness in the right face arm and trunk, which partially improved when he has ongoing numbness in the right back of his head and neck as well as his right trunk.  He denies any headache, double vision or loss of vision, new trouble with his speech, swallow, difficulty with urination (continues to produce urine while this is ESRD), chest pain, chest pressure, shortness of breath, cough, fevers, chills, any weakness anywhere, or any other sensory loss (does have stable diabetic neuropathy).  He reports he is compliant with his nightly dialysis and does sometimes Friday and/or Saturday.  He does report he had muscle pain with atorvastatin in the past, but is willing to try a lower dose and see if he can push through the pain and see if it resolves over time  LKW: 6 PM 8/23 tPA given?: No, out of the window NIHSS: 1 for sensory symptoms on the right  ROS: A 14 point ROS was performed and is negative except as noted in the HPI.   Past Medical History:  Diagnosis Date  . 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 Cardiomyopathy ([place])    a. 12/2018 Echo: EF 40-45%, global HK. Nl RV fxn. Mild-mod LAE. Mild AI, Mild-mod MR. Asc Ao 3.7cm; b. 01/2019 Lexi MV: small, mild, fixed basal and mid antlat defect - scar vs artifact. Small,  mild mid and apical inf minimally reversible defect, likely scar w/ peri-infarct ischemia. Coronary and Ao atherosclerosis.  . Complication of anesthesia    had to be woken up slowly as his bp was elevated when did this quickly  . Diabetes mellitus without complication ([place])   . ESRD (end stage renal disease) ([place])    a. Peritoneal Dialysis pt.  . Hyperlipidemia   . Hypertension   . Migraine    cluster  . Neuromuscular disorder ([place])    left lower extrem neuropathy  . Obstructive sleep apnea    no OSA since had facial surgery with dr. Kathyrn Sheriff in 1997  . PAD (peripheral artery disease) [facility]) Feb 2009   nonobstructing, renal angiogram ([place])  . Renal cell carcinoma 2004   left kidney heminephrectomy  . Renal insufficiency   . TIA (transient ischemic attack)    no residual but left leg and foot still feel heavy  . tobacco abuse    Home medications, to be fully reviewed with assistance of pharmacy technician albuterol (VENTOLIN HFA) 108 (90 Base) MCG/ACT inhaler Inhale 2 puffs into the lungs every 6 (six) hours as needed for wheezing or shortness of breath. Crecencio Mc, MD Needs Review  amLODipine (NORVASC) 10 MG tablet TAKE 1 TABLET BY MOUTH ONCE DAILY Crecencio Mc, MD Needs Review  aspirin 81 MG tablet Take 1 tablet (81 mg total) by mouth daily. Crecencio Mc, MD Needs Review  calcitRIOL (ROCALTROL) 0.25 MCG capsule Take 0.25 mcg by mouth  daily. Take one capsule only on Monday / Wednesday / FRIDAY [provider] Needs Review  carvedilol (COREG) 6.25 MG tablet Take 6.25 mg by mouth 2 (two) times daily with a meal.  [provider] Needs Review  cephALEXin (KEFLEX) 500 MG capsule Take 500 mg by mouth as needed.  [provider] Needs Review  cloNIDine (CATAPRES) 0.2 MG tablet Take 1 tablet (0.2 mg total) by mouth 3 (three) times daily. End, Harrell Gave, MD Needs Review  diphenhydrAMINE (BENADRYL) 25 MG tablet Take 25 mg by mouth every 6 (six) hours as  needed. [provider] Needs Review  doxazosin (CARDURA) 2 MG tablet Take 1 tablet (2 mg total) by mouth daily. Theora Gianotti, NP Needs Review  ezetimibe (ZETIA) 10 MG tablet Take 1 tablet (10 mg total) by mouth daily. Theora Gianotti, NP Needs Review  ferrous sulfate 325 (65 FE) MG tablet Take 1 tablet (325 mg total) by mouth daily. With orange juice Crecencio Mc, MD Needs Review  fluticasone (FLONASE) 50 MCG/ACT nasal spray Place 2 sprays into both nostrils daily. Crecencio Mc, MD Needs Review  gabapentin (NEURONTIN) 100 MG capsule TAKE 1 CAPSULE BY MOUTH 3 TIMES DAILY Crecencio Mc, MD Needs Review  ipratropium-albuterol (DUONEB) 0.5-2.5 (3) MG/3ML nebulizer solution 3 mL  Crecencio Mc, MD Needs Review  Lactulose 20 GM/30ML SOLN 30 ml daily for constipation Crecencio Mc, MD Needs Review  losartan (COZAAR) 100 MG tablet Take 100 mg by mouth daily. [provider] Needs Review  sodium bicarbonate 325 MG tablet TK 1 T PO BID [provider] Needs Review  traMADol (ULTRAM) 50 MG tablet Take 1 tablet (50 mg total) by mouth 2 (two) times daily. Crecencio Mc, MD Needs Review   Patient taking differently: Take 50 mg by mouth 2 (two) times daily as needed.         Family History  Problem Relation Age of Onset  . Hypertension Mother   . Cancer Mother        breast  . Aneurysm Mother   . Coronary artery disease Father   . Hypertension Father   . Stroke Father 49  . Heart disease Father   . Heart attack Father 69  . Aneurysm Maternal Grandmother        brain  . Aneurysm Paternal Grandmother        brain  . Coronary artery disease Paternal Grandfather   . Heart disease Brother        valvular heart disease  . COPD Brother   . Hypertension Brother   . Stroke Paternal Uncle     Social History:  reports that he has been smoking cigarettes. He has a 50.00 pack-year smoking history. He has never used smokeless tobacco. He  reports previous alcohol use. He reports that he does not use drugs.  Exam: Current vital signs: BP (!) 204/84   Pulse (!) 55   Temp 97.7 F (36.5 C) (Oral)   Resp 20   Ht 5' 6"  (1.676 m)   Wt 63.5 kg   SpO2 100%   BMI 22.60 kg/m  Vital signs in last 24 hours: Temp:  [97.7 F (36.5 C)] 97.7 F (36.5 C) (08/23 0810) Pulse Rate:  [50-58] 55 (08/23 1515) Resp:  [16-25] 20 (08/23 1515) BP: (161-224)/(68-93) 204/84 (08/23 1507) SpO2:  [97 %-100 %] 100 % (08/23 1515) Weight:  [63.5 kg] 63.5 kg (08/23 0811)   Physical Exam  Constitutional: Appears well-developed and  well-nourished.  Psych: Affect appropriate to situation Eyes: No scleral injection HENT: No OP obstrucion MSK: no joint deformities.  Cardiovascular: Normal rate and regular rhythm.  Respiratory: Effort normal, non-labored breathing GI: Soft.  No distension. There is no tenderness.  Skin: WDI  Neuro: Mental Status: Patient is awake, alert, oriented to person, place, month, year, and situation.  Able to name the current and past 2 presidents, able to name all the days of the week backwards.  Able to follow multistep commands.  Intact naming and repetition. Patient is able to give a clear and coherent history. Cranial Nerves: II: Visual Fields are full. Pupils are equal, round, and reactive to light.  3 to 2 mm III,IV, VI: EOMI without ptosis or diploplia, mildly saccadic pursuits especially in vertical gaze V: Facial sensation is symmetric to light touch VII: Facial movement is symmetric.  VIII: hearing is intact to voice X: Uvula elevates symmetrically XI: Shoulder shrug is symmetric. XII: tongue is midline without atrophy or fasciculations.  Motor: Tone is normal. Bulk is normal. 5/5 strength was present in all four extremities.  Sensory: Sensation is symmetric to light touch and temperature in the arms and leg, with loss only in a small posterior portion of the head and right neck as well as the right  trunk. Deep Tendon Reflexes 3+ and symmetric in the biceps, brachioradialis and patellae and 2+ in the Achilles, negative Hoffmann's but positive crossed abductors.  No ankle clonus. Plantars: Toes are downgoing bilaterally.  Cerebellar: FNF and HKS are intact bilaterally Gait: Able to rise on heels and toes, able to stand in tandem stance.  Romberg is negative   I have reviewed labs in epic and the results pertinent to this consultation are: Creatinine 7.6, GFR 6, LDL 111, total cholesterol 173, HDL 22, ESR 49, TSH normal 3.479 Lab Results  Component Value Date   HGBA1C 5.7 (A) 11/30/2018   HGBA1C 5.7 11/30/2018    Lab Results  Component Value Date   CHOL 173 01/14/2020   HDL 22 (L) 01/14/2020   LDLCALC 111 (H) 01/14/2020   LDLDIRECT 134.0 05/11/2017   TRIG 202 (H) 01/14/2020   CHOLHDL 7.9 01/14/2020   I have personally reviewed the images obtained:   Head CT without acute intracranial process MRI brain with punctate lesions in bilateral cerebral hemispheres, left more than right, with some on the left in the appropriate cortical location to explain sensory symptoms experienced CTA with severe level 5 aortic arch atheroma, with some concern for ulcerated plaque with possible thrombus extending into the lumen  Impression: Mr. Juris Gosnell is a 74 year old gentleman with multiple vascular risk factors presenting with multifocal strokes with a central embolic source which can only be the aortic arch or the heart.  He will certainly need admission for monitoring including telemetry, echocardiogram.  Given the ulcerated and very irregular appearance of his significant aortic arch atherosclerosis, appreciate cardiology recommendations regarding management. Patient agrees to trial of statin again.   Recommendations: - HgbA1c, fasting lipid panel, TSH, ESR - MRI brain, completed  - Frequent neuro checks (q4hr) - Echocardiogram, pending - CTA, completed  - Prophylactic  therapy-Antiplatelet med: Aspirin - 337m PR received  - Atorvastatin 40 mg nightly - Risk factor modification, counseled extensively on stopping smoking and better diet  - Nicotine patch ordered for smoking cessation - Telemetry monitoring - PT consult, OT consult,  unnecessary given minimal symptoms and reassuring exam - Speech consult ordered given failed bedside swallow for RN due to  drooling - Appreciate cardiology evaluation of aortic arch atheroma and specifically whether dual antiplatelet therapy will be appropriate/sufficient management anticoagulation should be started  -No neurological contraindication for anticoagulation at this time given the very small size of strokes, but please involve neurology prior to anticoagulation should there be any change in the patient's neurological examination. - If cardiology does not think anticoagulation is indicated, would additionally load with Plavix at least 300 mg or per cardiology recommendations - Neurology will follow  [place] [phone]

## 2020-01-14 NOTE — ED Notes (Signed)
This RN called MRI to verify they would be able to scan pt due to peritoneal catheter. MRI verifying they can scan pt. MD made aware

## 2020-01-14 NOTE — ED Notes (Signed)
This RN messaged admitting MD in regards to pt's BP still remaining 446X systolic. No new orders at this time

## 2020-01-14 NOTE — Progress Notes (Signed)
*  PRELIMINARY RESULTS* Echocardiogram  has been performed.  [name] 01/14/2020, 7:39 PM

## 2020-01-15 ENCOUNTER — Inpatient Hospital Stay: Admit: 2020-01-15 | Payer: Medicare Other

## 2020-01-15 ENCOUNTER — Other Ambulatory Visit: Payer: Self-pay | Admitting: Neurology

## 2020-01-15 ENCOUNTER — Other Ambulatory Visit: Payer: Self-pay | Admitting: Nurse Practitioner

## 2020-01-15 DIAGNOSIS — I428 Other cardiomyopathies: Secondary | ICD-10-CM

## 2020-01-15 DIAGNOSIS — I639 Cerebral infarction, unspecified: Secondary | ICD-10-CM

## 2020-01-15 DIAGNOSIS — I69398 Other sequelae of cerebral infarction: Secondary | ICD-10-CM

## 2020-01-15 DIAGNOSIS — I10 Essential (primary) hypertension: Secondary | ICD-10-CM | POA: Diagnosis not present

## 2020-01-15 DIAGNOSIS — Z992 Dependence on renal dialysis: Secondary | ICD-10-CM | POA: Diagnosis not present

## 2020-01-15 DIAGNOSIS — N2581 Secondary hyperparathyroidism of renal origin: Secondary | ICD-10-CM | POA: Diagnosis not present

## 2020-01-15 DIAGNOSIS — I700 Atherosclerosis of aorta: Secondary | ICD-10-CM | POA: Diagnosis not present

## 2020-01-15 DIAGNOSIS — Z720 Tobacco use: Secondary | ICD-10-CM | POA: Diagnosis not present

## 2020-01-15 DIAGNOSIS — I6389 Other cerebral infarction: Secondary | ICD-10-CM | POA: Diagnosis not present

## 2020-01-15 DIAGNOSIS — I7779 Dissection of other artery: Secondary | ICD-10-CM | POA: Diagnosis not present

## 2020-01-15 DIAGNOSIS — D631 Anemia in chronic kidney disease: Secondary | ICD-10-CM | POA: Diagnosis not present

## 2020-01-15 DIAGNOSIS — E785 Hyperlipidemia, unspecified: Secondary | ICD-10-CM | POA: Diagnosis not present

## 2020-01-15 DIAGNOSIS — I6349 Cerebral infarction due to embolism of other cerebral artery: Secondary | ICD-10-CM

## 2020-01-15 DIAGNOSIS — N186 End stage renal disease: Secondary | ICD-10-CM

## 2020-01-15 DIAGNOSIS — D509 Iron deficiency anemia, unspecified: Secondary | ICD-10-CM | POA: Diagnosis not present

## 2020-01-15 LAB — URINE DRUG SCREEN, QUALITATIVE (ARMC ONLY)
Amphetamines, Ur Screen: NOT DETECTED
Barbiturates, Ur Screen: NOT DETECTED
Benzodiazepine, Ur Scrn: NOT DETECTED
Cannabinoid 50 Ng, Ur ~~LOC~~: NOT DETECTED
Cocaine Metabolite,Ur ~~LOC~~: NOT DETECTED
MDMA (Ecstasy)Ur Screen: NOT DETECTED
Methadone Scn, Ur: NOT DETECTED
Opiate, Ur Screen: NOT DETECTED
Phencyclidine (PCP) Ur S: NOT DETECTED
Tricyclic, Ur Screen: NOT DETECTED

## 2020-01-15 LAB — ECHOCARDIOGRAM COMPLETE BUBBLE STUDY
AR max vel: 2.46 cm2
AV Peak grad: 6 mmHg
Ao pk vel: 1.22 m/s
Area-P 1/2: 6.12 cm2
S' Lateral: 4.38 cm
Single Plane A4C EF: 38.8 %

## 2020-01-15 LAB — LIPID PANEL
Cholesterol: 180 mg/dL (ref 0–200)
HDL: 23 mg/dL — ABNORMAL LOW (ref >40)
LDL Cholesterol: 111 mg/dL — ABNORMAL HIGH (ref 0–99)
Total CHOL/HDL Ratio: 7.8 RATIO
Triglycerides: 232 mg/dL — ABNORMAL HIGH (ref <150)
VLDL: 46 mg/dL — ABNORMAL HIGH (ref 0–40)

## 2020-01-15 LAB — HEMOGLOBIN A1C
Hgb A1c MFr Bld: 6.2 % — ABNORMAL HIGH (ref 4.8–5.6)
Mean Plasma Glucose: 131 mg/dL

## 2020-01-15 MED ORDER — CLOPIDOGREL BISULFATE 75 MG PO TABS
75.0000 mg | ORAL_TABLET | Freq: Every day | ORAL | 0 refills | Status: DC
Start: 2020-01-16 — End: 2020-04-02

## 2020-01-15 MED ORDER — ROSUVASTATIN CALCIUM 5 MG PO TABS: 5.0000 mg | ORAL_TABLET | Freq: Every day | ORAL | Status: DC

## 2020-01-15 MED ORDER — ASPIRIN EC 81 MG PO TBEC: 81.0000 mg | DELAYED_RELEASE_TABLET | Freq: Every day | ORAL | Status: DC

## 2020-01-15 MED ORDER — IOHEXOL 350 MG/ML SOLN: 75.0000 mL | Freq: Once | INTRAVENOUS | Status: DC | PRN

## 2020-01-15 MED ORDER — GENTAMICIN SULFATE 0.1 % EX CREA
1.0000 "application " | TOPICAL_CREAM | Freq: Every day | CUTANEOUS | Status: DC
  Filled 2020-01-15: qty 15

## 2020-01-15 MED ORDER — CLOPIDOGREL BISULFATE 75 MG PO TABS: 75.0000 mg | ORAL_TABLET | Freq: Every day | ORAL | Status: DC

## 2020-01-15 MED ORDER — GENTAMICIN SULFATE 0.1 % EX CREA: 1.0000 "application " | TOPICAL_CREAM | Freq: Every day | CUTANEOUS | 0 refills | Status: DC

## 2020-01-15 MED ORDER — ROSUVASTATIN CALCIUM 20 MG PO TABS: 20.0000 mg | ORAL_TABLET | Freq: Every evening | ORAL | 0 refills | Status: DC

## 2020-01-15 MED ORDER — CLOPIDOGREL BISULFATE 75 MG PO TABS
300.0000 mg | ORAL_TABLET | Freq: Once | ORAL | Status: AC
  Administered 2020-01-15: 300 mg via ORAL
  Filled 2020-01-15: qty 4

## 2020-01-15 MED ORDER — DELFLEX-LC/2.5% DEXTROSE 394 MOSM/L IP SOLN
INTRAPERITONEAL | Status: DC
  Filled 2020-01-15: qty 3000

## 2020-01-15 MED ORDER — CLONIDINE 0.3 MG/24HR TD PTWK
0.6000 mg | MEDICATED_PATCH | TRANSDERMAL | 0 refills | Status: DC
Start: 2020-01-21 — End: 2020-04-02

## 2020-01-15 MED ORDER — CLOPIDOGREL BISULFATE 75 MG PO TABS: 300.0000 mg | ORAL_TABLET | Freq: Once | ORAL | Status: DC

## 2020-01-15 MED ORDER — NICOTINE 14 MG/24HR TD PT24: MEDICATED_PATCH | TRANSDERMAL | 0 refills | Status: DC

## 2020-01-15 NOTE — Progress Notes (Signed)
SLP Cancellation Note  Patient Details Name: [name] MRN: [id number] DOB: 1945-10-22   Cancelled treatment:       Reason Eval/Treat Not Completed: SLP screened, no needs identified, will sign off  Happi B. Rutherford Nail M.S., CCC-SLP, [place] [facility] [phone]  Happi Rutherford Nail 01/15/2020, 10:05 AM

## 2020-01-15 NOTE — ED Notes (Signed)
Report received from [place], RN.

## 2020-01-15 NOTE — Care Management Obs Status (Signed)
[facility] NOTIFICATION   Patient Details  Name: [name] MRN: [id number] Date of Birth: 1945-07-18   Medicare Observation Status Notification Given:  Yes    Anselm Pancoast, RN 01/15/2020, 2:29 PM

## 2020-01-15 NOTE — Discharge Summary (Signed)
[facility] [place] NAME: [name]    MR#:  [id number]  DATE OF BIRTH:  04/29/1946  DATE OF ADMISSION:  01/14/2020 ADMITTING PHYSICIAN: Loletha Grayer, MD  DATE OF DISCHARGE: 01/15/2020  3:16 PM  PRIMARY CARE PHYSICIAN: Crecencio Mc, MD    ADMISSION DIAGNOSIS:  Stroke [facility]) [I63.9] CVA (cerebral vascular accident) ([place]) [I63.9]  DISCHARGE DIAGNOSIS:  Active Problems:   Hyperlipidemia   History of renal cell carcinoma   PAD (peripheral artery disease) (HCC)   Prostate CA ([place])   Adenocarcinoma, renal cell (HCC)   DM (diabetes mellitus), type 2 with renal complications (HCC)   CKD (chronic kidney disease), stage V ([place])   Cardiomyopathy ([place])   ESRD (end stage renal disease) ([place])   Stroke ([place])   CVA (cerebral vascular accident) ([place])   SECONDARY DIAGNOSIS:   Past Medical History:  Diagnosis Date  . 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 Cardiomyopathy ([place])    a. 12/2018 Echo: EF 40-45%, global HK. Nl RV fxn. Mild-mod LAE. Mild AI, Mild-mod MR. Asc Ao 3.7cm; b. 01/2019 [place]i MV: small, mild, fixed basal and mid antlat defect - scar vs artifact. Small, mild mid and apical inf minimally reversible defect, likely scar w/ peri-infarct ischemia. Coronary and Ao atherosclerosis.  . Complication of anesthesia    had to be woken up slowly as his bp was elevated when did this quickly  . Diabetes mellitus without complication ([place])   . ESRD (end stage renal disease) ([place])    a. Peritoneal Dialysis pt.  . Hyperlipidemia   . Hypertension   . Migraine    cluster  . Neuromuscular disorder ([place])    left lower extrem neuropathy  . Obstructive sleep apnea    no OSA since had facial surgery with dr. Kathyrn Sheriff in 1997  . PAD (peripheral artery disease) [facility]) Feb 2009   nonobstructing, renal angiogram ([place])  . Renal cell carcinoma 2004   left kidney heminephrectomy  . Renal insufficiency   .  TIA (transient ischemic attack)    no residual but left leg and foot still feel heavy  . tobacco abuse     HOSPITAL COURSE:   1.  Acute strokes involving the left cerebral hemisphere MCA and PCA territories as well as the right temporal lobe.  Seen by neurology and cardiology.  Aspirin daily and Plavix for 21 days.  Patient started on Crestor since had muscle aches with Lipitor.  Did well with physical therapy and no PT follow-up.  Patient did not complain of any weakness but did have some numbness in his right neck and right flank.  Cardiology set up for a monitor that will be mailed to him.  Echocardiogram did not show any sign of stroke.  CT angio of the carotids did not show any blockage.  LDL 111. 2.  Subclavian dissection.  Patient seen by vascular surgery and aspirin and Plavix was recommended no other surgical recommendations follow-up as outpatient. 3.  End-stage renal disease on peritoneal dialysis follow-up as outpatient. 4.  Accelerated hypertension on presentation was started on clonidine patches.  And restarted on Norvasc and Coreg.  Blood pressure trended better.  I told the patient's wife if they can fill the clonidine patch can go back on the clonidine pills. 5.  Hyperlipidemia with LDL 111.  Goal less than 70.  Continue Crestor 6.  Tobacco abuse.  Nicotine patch prescribed  DISCHARGE CONDITIONS:  Satisfactory  CONSULTS OBTAINED:  Treatment Team:  [name], MD  DRUG ALLERGIES:   Allergies  Allergen Reactions  . Irbesartan Other (See Comments)    hyperkalemia  . Cymbalta [Duloxetine Hcl]   . Atorvastatin Other (See Comments)    Muscle pain  . Bystolic [Nebivolol Hcl]     Extreme fatigue     DISCHARGE MEDICATIONS:   Allergies as of 01/15/2020      Reactions   Irbesartan Other (See Comments)   hyperkalemia   Cymbalta [duloxetine Hcl]    Atorvastatin Other (See Comments)   Muscle pain   Bystolic [nebivolol Hcl]    Extreme fatigue       Medication  List    STOP taking these medications   cloNIDine 0.2 MG tablet Commonly known as: Catapres     TAKE these medications   albuterol 108 (90 Base) MCG/ACT inhaler Commonly known as: VENTOLIN HFA Inhale 2 puffs into the lungs every 6 (six) hours as needed for wheezing or shortness of breath.   amLODipine 10 MG tablet Commonly known as: NORVASC TAKE 1 TABLET BY MOUTH ONCE DAILY   aspirin 81 MG tablet Take 1 tablet (81 mg total) by mouth daily.   Benadryl 25 MG tablet Generic drug: diphenhydrAMINE Take 25 mg by mouth every 6 (six) hours as needed.   calcitRIOL 0.25 MCG capsule Commonly known as: ROCALTROL Take 0.25 mcg by mouth daily. Take one capsule only on Monday / Wednesday / FRIDAY   carvedilol 6.25 MG tablet Commonly known as: COREG Take 6.25 mg by mouth 2 (two) times daily with a meal.   cloNIDine 0.3 mg/24hr patch Commonly known as: CATAPRES - Dosed in mg/24 hr Place 2 patches (0.6 mg total) onto the skin once a week. Start taking on: January 21, 2020   clopidogrel 75 MG tablet Commonly known as: PLAVIX Take 1 tablet (75 mg total) by mouth daily. Start taking on: January 16, 2020   ezetimibe 10 MG tablet Commonly known as: ZETIA Take 1 tablet (10 mg total) by mouth daily.   fluticasone 50 MCG/ACT nasal spray Commonly known as: FLONASE Place 2 sprays into both nostrils daily.   gabapentin 100 MG capsule Commonly known as: NEURONTIN TAKE 1 CAPSULE BY MOUTH 3 TIMES DAILY   gentamicin cream 0.1 % Commonly known as: GARAMYCIN Apply 1 application topically daily.   losartan 100 MG tablet Commonly known as: COZAAR Take 100 mg by mouth daily.   multivitamin Tabs tablet Take 1 tablet by mouth at bedtime.   nicotine 14 mg/24hr patch Commonly known as: NICODERM CQ - dosed in mg/24 hours 14 mg patch daily to chest wall, okay to substitute generic   rosuvastatin 20 MG tablet Commonly known as: Crestor Take 1 tablet (20 mg total) by mouth at bedtime.    sodium bicarbonate 325 MG tablet TK 1 T PO BID        DISCHARGE INSTRUCTIONS:   Follow-up with PMD 5 days Follow-up with cardiology 2 weeks Follow-up with nephrology as outpatient Follow-up with vascular surgery  If you experience worsening of your admission symptoms, develop shortness of breath, life threatening emergency, suicidal or homicidal thoughts you must seek medical attention immediately by calling 911 or calling your MD immediately  if symptoms less severe.  You Must read complete instructions/literature along with all the possible adverse reactions/side effects for all the Medicines you take and that have been prescribed to you. Take any new Medicines after you have completely understood and accept all the  possible adverse reactions/side effects.   Please note  You were cared for by a hospitalist during your hospital stay. If you have any questions about your discharge medications or the care you received while you were in the hospital after you are discharged, you can call the unit and asked to speak with the hospitalist on call if the hospitalist that took care of you is not available. Once you are discharged, your primary care physician will handle any further medical issues. Please note that NO REFILLS for any discharge medications will be authorized once you are discharged, as it is imperative that you return to your primary care physician (or establish a relationship with a primary care physician if you do not have one) for your aftercare needs so that they can reassess your need for medications and monitor your lab values.    Today   CHIEF COMPLAINT:   Chief Complaint  Patient presents with  . Numbness  . Weakness    HISTORY OF PRESENT ILLNESS:  [name]  is a 74 y.o. male came in with numbness right neck and flank.  Also had weakness.   VITAL SIGNS:  Blood pressure (!) 150/65, pulse (!) 57, temperature 98.6 F (37 C), temperature source Oral, resp.  rate 16, height 5\' 6"  (1.676 m), weight 63.5 kg, SpO2 98 %.  I/O:    Intake/Output Summary (Last 24 hours) at 01/15/2020 1819 Last data filed at 01/15/2020 1154 Gross per 24 hour  Intake --  Output 1400 ml  Net -1400 ml    PHYSICAL EXAMINATION:  GENERAL:  74 y.o.-year-old patient lying in the bed with no acute distress.  EYES: Pupils equal, round, reactive to light and accommodation. No scleral icterus. Extraocular muscles intact.  HEENT: Head atraumatic, normocephalic. Oropharynx and nasopharynx clear.  LUNGS: Normal breath sounds bilaterally, no wheezing, rales,rhonchi or crepitation. No use of accessory muscles of respiration.  CARDIOVASCULAR: S1, S2 normal. No murmurs, rubs, or gallops.  ABDOMEN: Soft, non-tender, non-distended. Bowel sounds present. No organomegaly or mass.  EXTREMITIES: No pedal edema.  NEUROLOGIC: Cranial nerves II through XII are intact. Muscle strength 5/5 in all extremities. Sensation intact. Gait not checked.  PSYCHIATRIC: The patient is alert and oriented x 3.  SKIN: No obvious rash, lesion, or ulcer.   DATA REVIEW:   CBC Recent Labs  Lab 01/14/20 0813  WBC 12.2*  HGB 11.6*  HCT 32.6*  PLT 215    Chemistries  Recent Labs  Lab 01/14/20 0813  NA 139  K 3.7  CL 102  CO2 23  GLUCOSE 121*  BUN 62*  CREATININE 7.60*  CALCIUM 8.8*  AST 9*  ALT 10  ALKPHOS 101  BILITOT 0.9     Microbiology Results  Results for orders placed or performed during the hospital encounter of 01/14/20  SARS Coronavirus 2 by RT PCR (hospital order, performed in [facility] hospital lab) Nasopharyngeal Nasopharyngeal Swab     Status: None   Collection Time: 01/14/20  4:54 PM   Specimen: Nasopharyngeal Swab  Result Value Ref Range Status   SARS Coronavirus 2 NEGATIVE NEGATIVE Final    Comment: (NOTE) SARS-CoV-2 target nucleic acids are NOT DETECTED.  The SARS-CoV-2 RNA is generally detectable in upper and lower respiratory specimens during the acute phase of  infection. The lowest concentration of SARS-CoV-2 viral copies this assay can detect is 250 copies / mL. A negative result does not preclude SARS-CoV-2 infection and should not be used as the sole basis for treatment or  other patient management decisions.  A negative result may occur with improper specimen collection / handling, submission of specimen other than nasopharyngeal swab, presence of viral mutation(s) within the areas targeted by this assay, and inadequate number of viral copies (<250 copies / mL). A negative result must be combined with clinical observations, patient history, and epidemiological information.  Fact Sheet for Patients:   StrictlyIdeas.no  Fact Sheet for Healthcare Providers: BankingDealers.co.za  This test is not yet approved or  cleared by the [place] FDA and has been authorized for detection and/or diagnosis of SARS-CoV-2 by FDA under an Emergency Use Authorization (EUA).  This EUA will remain in effect (meaning this test can be used) for the duration of the COVID-19 declaration under Section 564(b)(1) of the Act, 21 U.S.C. section 360bbb-3(b)(1), unless the authorization is terminated or revoked sooner.  Performed at [facility], [address] [place], [place] [address]     RADIOLOGY:  CT HEAD WO CONTRAST  Result Date: 01/14/2020 CLINICAL DATA:  Home with numbness on right side to rib cage, neck and shoulder since 6pm last night. Pt did his in home dialysis last pm. EXAM: CT HEAD WITHOUT CONTRAST TECHNIQUE: Contiguous axial images were obtained from the base of the skull through the vertex without intravenous contrast. COMPARISON:  01/03/2012 FINDINGS: Brain: No evidence of acute infarction, hemorrhage, extra-axial collection, ventriculomegaly, or mass effect. Generalized cerebral atrophy. Periventricular white matter low attenuation likely secondary to microangiopathy. Vascular:  Cerebrovascular atherosclerotic calcifications are noted. Skull: Negative for fracture or focal lesion. Sinuses/Orbits: Visualized portions of the orbits are unremarkable. Visualized portions of the paranasal sinuses are unremarkable. Visualized portions of the mastoid air cells are unremarkable. Other: None. IMPRESSION: 1. No acute intracranial pathology. 2. Chronic microvascular disease and cerebral atrophy. Electronically Signed   By: Kathreen Devoid   On: 01/14/2020 09:07   MR BRAIN WO CONTRAST  Result Date: 01/14/2020 CLINICAL DATA:  Right-sided numbness EXAM: MRI HEAD WITHOUT CONTRAST TECHNIQUE: Multiplanar, multiecho pulse sequences of the brain and surrounding structures were obtained without intravenous contrast. COMPARISON:  None. FINDINGS: Brain: There are scattered small foci of reduced diffusion in the left cerebral hemisphere involving expected MCA and PCA territories as well as the posterior right temporal lobe. There is no intracranial mass, mass effect, or edema. There is no hydrocephalus or extra-axial fluid collection. Prominence of the ventricles and sulci reflects minor generalized parenchymal volume loss. Patchy and confluent areas of T2 hyperintensity in the supratentorial white matter nonspecific but probably reflect mild to moderate chronic microvascular ischemic changes. Chronic infarcts of the right basal ganglia. Chronic left occipital infarct with chronic blood products. Small chronic left cerebellar infarct. Scattered small foci of susceptibility hypointensity in the cerebral white matter most compatible with chronic microhemorrhages. Vascular: Major vessel flow voids at the skull base are preserved. Skull and upper cervical spine: Normal marrow signal is preserved. Sinuses/Orbits: Paranasal sinuses are aerated. Orbits are unremarkable. Other: Sella is unremarkable.  Mastoid air cells are clear. IMPRESSION: Small acute infarcts involving left cerebral hemisphere (MCA and PCA  territories) as well as posterior right temporal lobe. Several chronic infarcts as described. Mild to moderate chronic microvascular ischemic changes. Minor burden of chronic microhemorrhages. Electronically Signed   By: Macy Mis M.D.   On: 01/14/2020 14:47   ECHOCARDIOGRAM COMPLETE BUBBLE STUDY  Result Date: 01/15/2020    ECHOCARDIOGRAM REPORT   Patient Name:   [name] Date of Exam: 01/14/2020 Medical Rec #:  [id number]  Height:       66.0 in Accession #:    [id number]      Weight:       140.0 lb Date of Birth:  06/07/1945       BSA:          1.719 m Patient Age:    8 years        BP:           191/83 mmHg Patient Gender: M               HR:           55 bpm. Exam Location:  [facility] Procedure: 2D Echo, Cardiac Doppler and Color Doppler Indications:     Stroke 434.91 / I63.9  History:         Patient has prior history of Echocardiogram examinations.                  Cardiomyopathy, TIA; Risk Factors:Hypertension.  Sonographer:     Alyse Low Roar Referring Phys:  [id number] Lorenza Chick Diagnosing Phys: [name] MD IMPRESSIONS  1. Left ventricular ejection fraction, by estimation, is 40 to 45%. The left ventricle has mildly decreased function. The left ventricle demonstrates global hypokinesis. There is moderate left ventricular hypertrophy. Left ventricular diastolic parameters are consistent with Grade I diastolic dysfunction (impaired relaxation). Elevated left atrial pressure.  2. Right ventricular systolic function is normal. The right ventricular size is normal. Mildly increased right ventricular wall thickness.  3. Left atrial size was severely dilated.  4. The mitral valve is degenerative. Mild mitral valve regurgitation. No evidence of mitral stenosis.  5. Unable to determine valve morphology due to image quality. Aortic valve regurgitation is mild. Mild to moderate aortic valve sclerosis/calcification is present, without any evidence of aortic stenosis.  6. Aortic dilatation noted.  There is mild dilatation of the ascending aorta measuring 37 mm.  7. The inferior vena cava is normal in size with greater than 50% respiratory variability, suggesting right atrial pressure of 3 mmHg.  8. Agitated saline contrast bubble study was negative, with no evidence of any interatrial shunt. FINDINGS  Left Ventricle: Left ventricular ejection fraction, by estimation, is 40 to 45%. The left ventricle has mildly decreased function. The left ventricle demonstrates global hypokinesis. The left ventricular internal cavity size was normal in size. There is  moderate left ventricular hypertrophy. Left ventricular diastolic parameters are consistent with Grade I diastolic dysfunction (impaired relaxation). Elevated left atrial pressure. Right Ventricle: The right ventricular size is normal. Mildly increased right ventricular wall thickness. Right ventricular systolic function is normal. Left Atrium: Left atrial size was severely dilated. Right Atrium: Right atrial size was normal in size. Pericardium: Trivial pericardial effusion is present. Mitral Valve: The mitral valve is degenerative in appearance. Mild mitral annular calcification. Mild mitral valve regurgitation. No evidence of mitral valve stenosis. Tricuspid Valve: The tricuspid valve is not well visualized. Tricuspid valve regurgitation is not demonstrated. Aortic Valve: Unable to determine valve morphology due to image quality.. There is mild thickening and mild calcification of the aortic valve. Aortic valve regurgitation is mild. Mild to moderate aortic valve sclerosis/calcification is present, without any evidence of aortic stenosis. There is mild thickening of the aortic valve. There is mild calcification of the aortic valve. Aortic valve peak gradient measures 6.0 mmHg. Pulmonic Valve: The pulmonic valve was not well visualized. Pulmonic valve regurgitation is mild. No evidence of pulmonic stenosis. Aorta: Aortic dilatation noted. There is mild  dilatation of the ascending aorta measuring 37 mm. Pulmonary Artery: The pulmonary artery is not well seen. Venous: The inferior vena cava is normal in size with greater than 50% respiratory variability, suggesting right atrial pressure of 3 mmHg. IAS/Shunts: The interatrial septum was not well visualized. Agitated saline contrast bubble study was negative, with no evidence of any interatrial shunt.  LEFT VENTRICLE PLAX 2D LVIDd:         5.48 cm      Diastology LVIDs:         4.38 cm      LV e' lateral:   3.05 cm/s LV PW:         1.32 cm      LV E/e' lateral: 18.3 LV IVS:        1.38 cm      LV e' medial:    3.48 cm/s LVOT diam:     2.20 cm      LV E/e' medial:  16.0 LVOT Area:     3.80 cm  LV Volumes (MOD) LV vol d, MOD A4C: 129.0 ml LV vol s, MOD A4C: 78.9 ml LV SV MOD A4C:     129.0 ml RIGHT VENTRICLE RV Mid diam:    3.16 cm RV S prime:     14.40 cm/s TAPSE (M-mode): 2.6 cm LEFT ATRIUM              Index LA diam:        4.70 cm  2.73 cm/m LA Vol (A2C):   86.7 ml  50.45 ml/m LA Vol (A4C):   111.0 ml 64.59 ml/m LA Biplane Vol: 105.0 ml 61.10 ml/m  AORTIC VALVE                PULMONIC VALVE AV Area (Vmax): 2.46 cm    PV Vmax:       0.71 m/s AV Vmax:        122.00 cm/s PV Peak grad:  2.0 mmHg AV Peak Grad:   6.0 mmHg LVOT Vmax:      79.00 cm/s  AORTA Ao Root diam: 3.70 cm MITRAL VALVE MV Area (PHT): 6.12 cm     SHUNTS MV Decel Time: 124 msec     Systemic Diam: 2.20 cm MV E velocity: 55.70 cm/s MV A velocity: 119.00 cm/s MV E/A ratio:  0.47 MV A Prime:    9.2 cm/s [name] MD Electronically signed by [name] MD Signature Date/Time: 01/15/2020/1:23:28 PM    Final    CT ANGIO HEAD CODE STROKE  Result Date: 01/14/2020 CLINICAL DATA:  Acute neuro deficit. Right-sided numbness. Acute infarct on MRI. EXAM: CT ANGIOGRAPHY HEAD AND NECK TECHNIQUE: Multidetector CT imaging of the head and neck was performed using the standard protocol during bolus administration of intravenous contrast. Multiplanar CT  image reconstructions and MIPs were obtained to evaluate the vascular anatomy. Carotid stenosis measurements (when applicable) are obtained utilizing NASCET criteria, using the distal internal carotid diameter as the denominator. CONTRAST:  56mL OMNIPAQUE IOHEXOL 350 MG/ML SOLN COMPARISON:  CT head and MRI head 01/14/2020 FINDINGS: CTA NECK FINDINGS Aortic arch: Extensive atherosclerotic disease in the aortic arch without aneurysm. Atherosclerotic disease in the proximal great vessels without significant stenosis. Short segment dissection of the proximal left subclavian artery without stenosis. Right carotid system: Atherosclerotic disease throughout the right common carotid artery and right carotid bifurcation. No significant stenosis. Left carotid system: Atherosclerotic disease throughout the left common carotid artery and left carotid bifurcation. Calcified and noncalcified plaque  in the proximal left internal carotid artery without significant stenosis. Vertebral arteries: Mild stenosis proximal right vertebral artery which is patent to the basilar. Mild stenosis proximal left vertebral artery. Moderate calcific stenosis distal left vertebral artery at the skull base. Skeleton: Prominent degenerative changes in the facet joints on the left. No acute skeletal abnormality. Other neck: No mass or adenopathy in the neck. Upper chest: Apical emphysema without acute abnormality. Review of the MIP images confirms the above findings CTA HEAD FINDINGS Anterior circulation: Mild atherosclerotic calcification cavernous carotid bilaterally without significant stenosis. Anterior and middle cerebral arteries patent without significant stenosis or large vessel occlusion. Posterior circulation: Moderate stenosis left vertebral artery at the skull base. Both vertebral arteries are patent to the basilar. PICA patent bilaterally. Basilar patent without stenosis. AICA, superior cerebellar, and posterior cerebral arteries are  patent. Mild stenosis in the P2 segment bilaterally. Venous sinuses: Normal venous enhancement Anatomic variants: None Review of the MIP images confirms the above findings IMPRESSION: 1. Extensive atherosclerotic disease. Aortic Atherosclerosis (ICD10-I70.0). 2. Atherosclerotic disease the carotid bifurcation bilaterally without significant stenosis. 3. Mild stenosis in the proximal vertebral artery bilaterally. Moderate calcific stenosis distal left vertebral artery. 4. Negative for intracranial large vessel occlusion or flow limiting stenosis. Mild stenosis in the P1 segment bilaterally. 5. 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 6. Preliminary report texted to Dr. Curly Sh[place] Electronically Signed   By: Franchot Gallo M.D.   On: 01/14/2020 16:42   CT ANGIO NECK CODE STROKE  Result Date: 01/14/2020 CLINICAL DATA:  Acute neuro deficit. Right-sided numbness. Acute infarct on MRI. EXAM: CT ANGIOGRAPHY HEAD AND NECK TECHNIQUE: Multidetector CT imaging of the head and neck was performed using the standard protocol during bolus administration of intravenous contrast. Multiplanar CT image reconstructions and MIPs were obtained to evaluate the vascular anatomy. Carotid stenosis measurements (when applicable) are obtained utilizing NASCET criteria, using the distal internal carotid diameter as the denominator. CONTRAST:  41mL OMNIPAQUE IOHEXOL 350 MG/ML SOLN COMPARISON:  CT head and MRI head 01/14/2020 FINDINGS: CTA NECK FINDINGS Aortic arch: Extensive atherosclerotic disease in the aortic arch without aneurysm. Atherosclerotic disease in the proximal great vessels without significant stenosis. Short segment dissection of the proximal left subclavian artery without stenosis. Right carotid system: Atherosclerotic disease throughout the right common carotid artery and right carotid bifurcation. No significant stenosis. Left carotid system: Atherosclerotic disease throughout the  left common carotid artery and left carotid bifurcation. Calcified and noncalcified plaque in the proximal left internal carotid artery without significant stenosis. Vertebral arteries: Mild stenosis proximal right vertebral artery which is patent to the basilar. Mild stenosis proximal left vertebral artery. Moderate calcific stenosis distal left vertebral artery at the skull base. Skeleton: Prominent degenerative changes in the facet joints on the left. No acute skeletal abnormality. Other neck: No mass or adenopathy in the neck. Upper chest: Apical emphysema without acute abnormality. Review of the MIP images confirms the above findings CTA HEAD FINDINGS Anterior circulation: Mild atherosclerotic calcification cavernous carotid bilaterally without significant stenosis. Anterior and middle cerebral arteries patent without significant stenosis or large vessel occlusion. Posterior circulation: Moderate stenosis left vertebral artery at the skull base. Both vertebral arteries are patent to the basilar. PICA patent bilaterally. Basilar patent without stenosis. AICA, superior cerebellar, and posterior cerebral arteries are patent. Mild stenosis in the P2 segment bilaterally. Venous sinuses: Normal venous enhancement Anatomic variants: None Review of the MIP images confirms the above findings IMPRESSION: 1. Extensive atherosclerotic disease. Aortic Atherosclerosis (ICD10-I70.0). 2.  Atherosclerotic disease the carotid bifurcation bilaterally without significant stenosis. 3. Mild stenosis in the proximal vertebral artery bilaterally. Moderate calcific stenosis distal left vertebral artery. 4. Negative for intracranial large vessel occlusion or flow limiting stenosis. Mild stenosis in the P1 segment bilaterally. 5. 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 6. Preliminary report texted to Dr. Curly [place] Electronically Signed   By: Franchot Gallo M.D.   On: 01/14/2020 16:42      Management plans discussed with the patient, family and they are in agreement.  CODE STATUS:     Code Status Orders  (From admission, onward)         Start     Ordered   01/14/20 1919  Full code  Continuous        01/14/20 1919        Code Status History    This patient has a current code status but no historical code status.   Advance Care Planning Activity      TOTAL TIME TAKING CARE OF THIS PATIENT: 35 minutes.    Loletha Grayer M.D on 01/15/2020 at 6:19 PM  Between 7am to 6pm - Pager - [phone]  After 6pm go to [email or web address] - password [email or web address]  [facility]  CC: Primary care physician; Crecencio Mc, MD

## 2020-01-15 NOTE — ED Notes (Signed)
Pt still eating. Provider [place] to bedside.

## 2020-01-15 NOTE — ED Notes (Signed)
Consulting staff at bedside.

## 2020-01-15 NOTE — Consult Note (Signed)
[facility] Vascular Consult Note  MRN : [id number]  [name] is a 74 y.o. ([birth year]) male who presents with chief complaint of  Chief Complaint  Patient presents with  . Numbness  . Weakness   History of Present Illness:  [name] is a 74 year old male with a past medical history of cardiomyopathy, ESRD on peritoneal dialysis, hypertension, hyperlipidemia, TIA, who presented to the emergency department for right-sided numbness.  According to the patient since yesterday he has been experiencing a numbness sensation of his right face arm and flank.  Patient states the arm feels somewhat back to normal but he continues to have subjective numbness in the right face and flank. Patient denies any headache.  No neck or back pain.  Denies any fever.  Largely negative review of systems.  Patient does state a prior history of a TIA with left-sided deficits but that has since resolved.  CTA Head / Neck (01/14/20): 1. Extensive atherosclerotic disease. Aortic Atherosclerosis 2. Atherosclerotic disease the carotid bifurcation bilaterally without significant stenosis. 3. Mild stenosis in the proximal vertebral artery bilaterally. Moderate calcific stenosis distal left vertebral artery. 4. Negative for intracranial large vessel occlusion or flow limiting stenosis. Mild stenosis in the P1 segment bilaterally. 5. 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  Vascular surgery was consulted by Dr. Dione Plover for possible recommendations in regard to the patient's carotid artery disease is an subclavian artery dissection.  Current Facility-Administered Medications  Medication Dose Route Frequency Provider Last Rate Last Admin  .  stroke: mapping our early stages of recovery book   Does not apply Once Clarnce Flock, MD      . acetaminophen (TYLENOL) tablet 650 mg  650 mg Oral Q4H PRN Clarnce Flock, MD       Or  .  acetaminophen (TYLENOL) 160 MG/5ML solution 650 mg  650 mg Per Tube Q4H PRN Clarnce Flock, MD       Or  . acetaminophen (TYLENOL) suppository 650 mg  650 mg Rectal Q4H PRN Clarnce Flock, MD      . amLODipine (NORVASC) tablet 10 mg  10 mg Oral Daily Clarnce Flock, MD   10 mg at 01/15/20 1015  . [START ON 01/16/2020] aspirin EC tablet 81 mg  81 mg Oral Daily Bhagat, Srishti L, MD      . atorvastatin (LIPITOR) tablet 40 mg  40 mg Oral Nightly Bhagat, Srishti L, MD      . Derrill Memo ON 01/16/2020] calcitRIOL (ROCALTROL) capsule 0.25 mcg  0.25 mcg Oral Q M,W,F Clarnce Flock, MD      . carvedilol (COREG) tablet 6.25 mg  6.25 mg Oral BID WC Clarnce Flock, MD   6.25 mg at 01/15/20 1015  . cloNIDine (CATAPRES - Dosed in mg/24 hr) patch 0.6 mg  0.6 mg Transdermal Weekly Clarnce Flock, MD   0.6 mg at 01/14/20 2013  . clopidogrel (PLAVIX) tablet 75 mg  75 mg Oral Daily Bhagat, Srishti L, MD      . dialysis solution 2.5% low-MG/low-CA dianeal solution   Intraperitoneal Q24H Lateef, Munsoor, MD      . gentamicin cream (GARAMYCIN) 0.1 % 1 application  1 application Topical Daily Lateef, Munsoor, MD      . heparin injection 5,000 Units  5,000 Units Subcutaneous Q8H Clarnce Flock, MD      . hydrALAZINE (APRESOLINE) injection 5 mg  5 mg Intravenous Q4H PRN Clayton Lefort  M, MD      . iohexol (OMNIPAQUE) 350 MG/ML injection 75 mL  75 mL Intravenous Once PRN Wieting, Richard, MD      . ipratropium-albuterol (DUONEB) 0.5-2.5 (3) MG/3ML nebulizer solution 3 mL  3 mL Nebulization Q6H Deborra Medina L, MD      . losartan (COZAAR) tablet 100 mg  100 mg Oral Daily Clarnce Flock, MD   100 mg at 01/15/20 1015  . nicotine (NICODERM CQ - dosed in mg/24 hours) patch 14 mg  14 mg Transdermal Daily Clarnce Flock, MD      . sodium bicarbonate tablet 325 mg  325 mg Oral TID Clarnce Flock, MD   325 mg at 01/15/20 1015   Current Outpatient Medications  Medication Sig Dispense Refill  .  albuterol (VENTOLIN HFA) 108 (90 Base) MCG/ACT inhaler Inhale 2 puffs into the lungs every 6 (six) hours as needed for wheezing or shortness of breath. 18 g 0  . amLODipine (NORVASC) 10 MG tablet TAKE 1 TABLET BY MOUTH ONCE DAILY 90 tablet 1  . aspirin 81 MG tablet Take 1 tablet (81 mg total) by mouth daily. 30 tablet 11  . calcitRIOL (ROCALTROL) 0.25 MCG capsule Take 0.25 mcg by mouth daily. Take one capsule only on Monday / Wednesday / FRIDAY  3  . carvedilol (COREG) 6.25 MG tablet Take 6.25 mg by mouth 2 (two) times daily with a meal.   6  . cloNIDine (CATAPRES) 0.2 MG tablet Take 1 tablet (0.2 mg total) by mouth 3 (three) times daily. 270 tablet 3  . diphenhydrAMINE (BENADRYL) 25 MG tablet Take 25 mg by mouth every 6 (six) hours as needed.    . ezetimibe (ZETIA) 10 MG tablet Take 1 tablet (10 mg total) by mouth daily. 90 tablet 3  . fluticasone (FLONASE) 50 MCG/ACT nasal spray Place 2 sprays into both nostrils daily. 16 g 6  . gabapentin (NEURONTIN) 100 MG capsule TAKE 1 CAPSULE BY MOUTH 3 TIMES DAILY 90 capsule 1  . losartan (COZAAR) 100 MG tablet Take 100 mg by mouth daily.    . multivitamin (RENA-VIT) TABS tablet Take 1 tablet by mouth at bedtime.    . sodium bicarbonate 325 MG tablet TK 1 T PO BID  1   Past Medical History:  Diagnosis Date  . 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 Cardiomyopathy ([place])    a. 12/2018 Echo: EF 40-45%, global HK. Nl RV fxn. Mild-mod LAE. Mild AI, Mild-mod MR. Asc Ao 3.7cm; b. 01/2019 Lexi MV: small, mild, fixed basal and mid antlat defect - scar vs artifact. Small, mild mid and apical inf minimally reversible defect, likely scar w/ peri-infarct ischemia. Coronary and Ao atherosclerosis.  . Complication of anesthesia    had to be woken up slowly as his bp was elevated when did this quickly  . Diabetes mellitus without complication ([place])   . ESRD (end stage renal disease) ([place])    a. Peritoneal Dialysis pt.  .  Hyperlipidemia   . Hypertension   . Migraine    cluster  . Neuromuscular disorder ([place])    left lower extrem neuropathy  . Obstructive sleep apnea    no OSA since had facial surgery with dr. Kathyrn Sheriff in 1997  . PAD (peripheral artery disease) [facility]) Feb 2009   nonobstructing, renal angiogram ([place])  . Renal cell carcinoma 2004   left kidney heminephrectomy  . Renal insufficiency   . TIA (transient ischemic  attack)    no residual but left leg and foot still feel heavy  . tobacco abuse    Past Surgical History:  Procedure Laterality Date  . CAPD INSERTION N/A 03/01/2019   Procedure: LAPAROSCOPIC INSERTION CONTINUOUS AMBULATORY PERITONEAL DIALYSIS  (CAPD) CATHETER;  Surgeon: Algernon Huxley, MD;  Location: [facility] ORS;  Service: Vascular;  Laterality: N/A;  . CARDIAC CATHETERIZATION     Dr. [name] did this to assess his renal artery  . cyst removal  12/25/2015   Spine L4 and L5  . heminephrectomy  2004   for renal cell CA  . RENAL CRYOABLATION  Jan 2006   right kidney,  [name]  . sciatica     Social History Social History   Tobacco Use  . Smoking status: Current Every Day Smoker    Packs/day: 1.00    Years: 50.00    Pack years: 50.00    Types: Cigarettes  . Smokeless tobacco: Never Used  Vaping Use  . Vaping Use: Former  . Devices: tried but did not like  Substance Use Topics  . Alcohol use: Not Currently  . Drug use: No   Family History Family History  Problem Relation Age of Onset  . Hypertension Mother   . Cancer Mother        breast  . Aneurysm Mother   . Coronary artery disease Father   . Hypertension Father   . Stroke Father 56  . Heart disease Father   . Heart attack Father 71  . Aneurysm Maternal Grandmother        brain  . Aneurysm Paternal Grandmother        brain  . Coronary artery disease Paternal Grandfather   . Heart disease Brother        valvular heart disease  . COPD Brother   . Hypertension Brother   . Stroke Paternal Uncle   Patient denies  any history of peripheral artery disease, venous disease or renal disease.  Allergies  Allergen Reactions  . Irbesartan Other (See Comments)    hyperkalemia  . Cymbalta [Duloxetine Hcl]   . Atorvastatin Other (See Comments)    Muscle pain  . Bystolic [Nebivolol Hcl]     Extreme fatigue    REVIEW OF SYSTEMS (Negative unless checked)  Constitutional: [] Weight loss  [] Fever  [] Chills Cardiac: [] Chest pain   [] Chest pressure   [] Palpitations   [] Shortness of breath when laying flat   [] Shortness of breath at rest   [x] Shortness of breath with exertion. Vascular:  [] Pain in legs with walking   [] Pain in legs at rest   [] Pain in legs when laying flat   [] Claudication   [] Pain in feet when walking  [] Pain in feet at rest  [] Pain in feet when laying flat   [] History of DVT   [] Phlebitis   [] Swelling in legs   [] Varicose veins   [] Non-healing ulcers Pulmonary:   [] Uses home oxygen   [] Productive cough   [] Hemoptysis   [] Wheeze  [x] COPD   [] Asthma Neurologic:  [] Dizziness  [] Blackouts   [] Seizures   [x] History of stroke   [] History of TIA  [] Aphasia   [] Temporary blindness   [] Dysphagia   [x] Weakness or numbness in arms   [] Weakness or numbness in legs Musculoskeletal:  [] Arthritis   [] Joint swelling   [] Joint pain   [] Low back pain Hematologic:  [] Easy bruising  [] Easy bleeding   [] Hypercoagulable state   [] Anemic  [] Hepatitis Gastrointestinal:  [] Blood in stool   [] Vomiting blood  []   Gastroesophageal reflux/heartburn   [] Difficulty swallowing. Genitourinary:  [x] Chronic kidney disease   [] Difficult urination  [] Frequent urination  [] Burning with urination   [] Blood in urine Skin:  [] Rashes   [] Ulcers   [] Wounds Psychological:  [] History of anxiety   []  History of major depression.  Physical Examination  Vitals:   01/15/20 1000 01/15/20 1030 01/15/20 1100 01/15/20 1130  BP: (!) 161/75 (!) 160/69 (!) 148/58 (!) 160/72  Pulse: 65 (!) 57 (!) 59 66  Resp: 13 19 18 16   Temp:      TempSrc:       SpO2: 95% 99% 96% 98%  Weight:      Height:       Body mass index is 22.6 kg/m. Gen:  WD/WN, NAD Head: [place]/AT, No temporalis wasting. Prominent temp pulse not noted. Ear/Nose/Throat: Hearing grossly intact, nares w/o erythema or drainage, oropharynx w/o Erythema/Exudate Eyes: Sclera non-icteric, conjunctiva clear Neck: Trachea midline.  No JVD.  Pulmonary:  Good air movement, respirations not labored, equal bilaterally.  Cardiac: RRR, normal S1, S2. Vascular:  Vessel Right Left  Radial Palpable Palpable  Ulnar Palpable Palpable  Brachial Palpable Palpable  Carotid Palpable, without bruit Palpable, without bruit  Aorta Not palpable N/A  Femoral Palpable Palpable  Popliteal Palpable Palpable  PT Palpable Palpable  DP Palpable Palpable   Gastrointestinal: soft, non-tender/non-distended. No guarding/reflex.  PD catheter: Intact, no signs of infection noted Musculoskeletal: M/S 5/5 throughout.  Extremities without ischemic changes.  No deformity or atrophy. No edema. Neurologic: Sensation grossly intact in extremities.  Symmetrical.  Speech is fluent. Motor exam as listed above. Psychiatric: Judgment intact, Mood & affect appropriate for pt's clinical situation. Dermatologic: No rashes or ulcers noted.  No cellulitis or open wounds. Lymph : No Cervical, Axillary, or Inguinal lymphadenopathy.  CBC Lab Results  Component Value Date   WBC 12.2 (H) 01/14/2020   HGB 11.6 (L) 01/14/2020   HCT 32.6 (L) 01/14/2020   MCV 85.1 01/14/2020   PLT 215 01/14/2020   BMET    Component Value Date/Time   NA 139 01/14/2020 0813   NA 136 (A) 04/19/2018 0000   NA 139 07/04/2012 1117   K 3.7 01/14/2020 0813   K 4.3 07/04/2012 1117   CL 102 01/14/2020 0813   CL 102 07/04/2012 1117   CO2 23 01/14/2020 0813   CO2 29 07/04/2012 1117   GLUCOSE 121 (H) 01/14/2020 0813   GLUCOSE 88 07/04/2012 1117   BUN 62 (H) 01/14/2020 0813   BUN 44 (A) 04/19/2018 0000   BUN 15 07/04/2012 1117   CREATININE  7.60 (H) 01/14/2020 0813   CREATININE 1.92 (H) 07/18/2015 1549   CALCIUM 8.8 (L) 01/14/2020 0813   CALCIUM 9.1 07/04/2012 1117   GFRNONAA 6 (L) 01/14/2020 0813   GFRNONAA 40 (L) 07/04/2012 1117   GFRAA 7 (L) 01/14/2020 0813   GFRAA 47 (L) 07/04/2012 1117   Estimated Creatinine Clearance: 7.7 mL/min (A) (by C-G formula based on SCr of 7.6 mg/dL (H)).  COAG Lab Results  Component Value Date   INR 1.0 01/14/2020   INR 1.1 02/26/2019   INR 0.9 01/03/2012   Radiology CT HEAD WO CONTRAST  Result Date: 01/14/2020 CLINICAL DATA:  Home with numbness on right side to rib cage, neck and shoulder since 6pm last night. Pt did his in home dialysis last pm. EXAM: CT HEAD WITHOUT CONTRAST TECHNIQUE: Contiguous axial images were obtained from the base of the skull through the vertex without intravenous contrast. COMPARISON:  01/03/2012  FINDINGS: Brain: No evidence of acute infarction, hemorrhage, extra-axial collection, ventriculomegaly, or mass effect. Generalized cerebral atrophy. Periventricular white matter low attenuation likely secondary to microangiopathy. Vascular: Cerebrovascular atherosclerotic calcifications are noted. Skull: Negative for fracture or focal lesion. Sinuses/Orbits: Visualized portions of the orbits are unremarkable. Visualized portions of the paranasal sinuses are unremarkable. Visualized portions of the mastoid air cells are unremarkable. Other: None. IMPRESSION: 1. No acute intracranial pathology. 2. Chronic microvascular disease and cerebral atrophy. Electronically Signed   By: Kathreen Devoid   On: 01/14/2020 09:07   MR BRAIN WO CONTRAST  Result Date: 01/14/2020 CLINICAL DATA:  Right-sided numbness EXAM: MRI HEAD WITHOUT CONTRAST TECHNIQUE: Multiplanar, multiecho pulse sequences of the brain and surrounding structures were obtained without intravenous contrast. COMPARISON:  None. FINDINGS: Brain: There are scattered small foci of reduced diffusion in the left cerebral hemisphere  involving expected MCA and PCA territories as well as the posterior right temporal lobe. There is no intracranial mass, mass effect, or edema. There is no hydrocephalus or extra-axial fluid collection. Prominence of the ventricles and sulci reflects minor generalized parenchymal volume loss. Patchy and confluent areas of T2 hyperintensity in the supratentorial white matter nonspecific but probably reflect mild to moderate chronic microvascular ischemic changes. Chronic infarcts of the right basal ganglia. Chronic left occipital infarct with chronic blood products. Small chronic left cerebellar infarct. Scattered small foci of susceptibility hypointensity in the cerebral white matter most compatible with chronic microhemorrhages. Vascular: Major vessel flow voids at the skull base are preserved. Skull and upper cervical spine: Normal marrow signal is preserved. Sinuses/Orbits: Paranasal sinuses are aerated. Orbits are unremarkable. Other: Sella is unremarkable.  Mastoid air cells are clear. IMPRESSION: Small acute infarcts involving left cerebral hemisphere (MCA and PCA territories) as well as posterior right temporal lobe. Several chronic infarcts as described. Mild to moderate chronic microvascular ischemic changes. Minor burden of chronic microhemorrhages. Electronically Signed   By: Macy Mis M.D.   On: 01/14/2020 14:47   CT ANGIO HEAD CODE STROKE  Result Date: 01/14/2020 CLINICAL DATA:  Acute neuro deficit. Right-sided numbness. Acute infarct on MRI. EXAM: CT ANGIOGRAPHY HEAD AND NECK TECHNIQUE: Multidetector CT imaging of the head and neck was performed using the standard protocol during bolus administration of intravenous contrast. Multiplanar CT image reconstructions and MIPs were obtained to evaluate the vascular anatomy. Carotid stenosis measurements (when applicable) are obtained utilizing NASCET criteria, using the distal internal carotid diameter as the denominator. CONTRAST:  48mL OMNIPAQUE  IOHEXOL 350 MG/ML SOLN COMPARISON:  CT head and MRI head 01/14/2020 FINDINGS: CTA NECK FINDINGS Aortic arch: Extensive atherosclerotic disease in the aortic arch without aneurysm. Atherosclerotic disease in the proximal great vessels without significant stenosis. Short segment dissection of the proximal left subclavian artery without stenosis. Right carotid system: Atherosclerotic disease throughout the right common carotid artery and right carotid bifurcation. No significant stenosis. Left carotid system: Atherosclerotic disease throughout the left common carotid artery and left carotid bifurcation. Calcified and noncalcified plaque in the proximal left internal carotid artery without significant stenosis. Vertebral arteries: Mild stenosis proximal right vertebral artery which is patent to the basilar. Mild stenosis proximal left vertebral artery. Moderate calcific stenosis distal left vertebral artery at the skull base. Skeleton: Prominent degenerative changes in the facet joints on the left. No acute skeletal abnormality. Other neck: No mass or adenopathy in the neck. Upper chest: Apical emphysema without acute abnormality. Review of the MIP images confirms the above findings CTA HEAD FINDINGS Anterior circulation: Mild atherosclerotic calcification cavernous  carotid bilaterally without significant stenosis. Anterior and middle cerebral arteries patent without significant stenosis or large vessel occlusion. Posterior circulation: Moderate stenosis left vertebral artery at the skull base. Both vertebral arteries are patent to the basilar. PICA patent bilaterally. Basilar patent without stenosis. AICA, superior cerebellar, and posterior cerebral arteries are patent. Mild stenosis in the P2 segment bilaterally. Venous sinuses: Normal venous enhancement Anatomic variants: None Review of the MIP images confirms the above findings IMPRESSION: 1. Extensive atherosclerotic disease. Aortic Atherosclerosis (ICD10-I70.0).  2. Atherosclerotic disease the carotid bifurcation bilaterally without significant stenosis. 3. Mild stenosis in the proximal vertebral artery bilaterally. Moderate calcific stenosis distal left vertebral artery. 4. Negative for intracranial large vessel occlusion or flow limiting stenosis. Mild stenosis in the P1 segment bilaterally. 5. 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 6. Preliminary report texted to Dr. Curly Shores Electronically Signed   By: Franchot Gallo M.D.   On: 01/14/2020 16:42   CT ANGIO NECK CODE STROKE  Result Date: 01/14/2020 CLINICAL DATA:  Acute neuro deficit. Right-sided numbness. Acute infarct on MRI. EXAM: CT ANGIOGRAPHY HEAD AND NECK TECHNIQUE: Multidetector CT imaging of the head and neck was performed using the standard protocol during bolus administration of intravenous contrast. Multiplanar CT image reconstructions and MIPs were obtained to evaluate the vascular anatomy. Carotid stenosis measurements (when applicable) are obtained utilizing NASCET criteria, using the distal internal carotid diameter as the denominator. CONTRAST:  77mL OMNIPAQUE IOHEXOL 350 MG/ML SOLN COMPARISON:  CT head and MRI head 01/14/2020 FINDINGS: CTA NECK FINDINGS Aortic arch: Extensive atherosclerotic disease in the aortic arch without aneurysm. Atherosclerotic disease in the proximal great vessels without significant stenosis. Short segment dissection of the proximal left subclavian artery without stenosis. Right carotid system: Atherosclerotic disease throughout the right common carotid artery and right carotid bifurcation. No significant stenosis. Left carotid system: Atherosclerotic disease throughout the left common carotid artery and left carotid bifurcation. Calcified and noncalcified plaque in the proximal left internal carotid artery without significant stenosis. Vertebral arteries: Mild stenosis proximal right vertebral artery which is patent to the  basilar. Mild stenosis proximal left vertebral artery. Moderate calcific stenosis distal left vertebral artery at the skull base. Skeleton: Prominent degenerative changes in the facet joints on the left. No acute skeletal abnormality. Other neck: No mass or adenopathy in the neck. Upper chest: Apical emphysema without acute abnormality. Review of the MIP images confirms the above findings CTA HEAD FINDINGS Anterior circulation: Mild atherosclerotic calcification cavernous carotid bilaterally without significant stenosis. Anterior and middle cerebral arteries patent without significant stenosis or large vessel occlusion. Posterior circulation: Moderate stenosis left vertebral artery at the skull base. Both vertebral arteries are patent to the basilar. PICA patent bilaterally. Basilar patent without stenosis. AICA, superior cerebellar, and posterior cerebral arteries are patent. Mild stenosis in the P2 segment bilaterally. Venous sinuses: Normal venous enhancement Anatomic variants: None Review of the MIP images confirms the above findings IMPRESSION: 1. Extensive atherosclerotic disease. Aortic Atherosclerosis (ICD10-I70.0). 2. Atherosclerotic disease the carotid bifurcation bilaterally without significant stenosis. 3. Mild stenosis in the proximal vertebral artery bilaterally. Moderate calcific stenosis distal left vertebral artery. 4. Negative for intracranial large vessel occlusion or flow limiting stenosis. Mild stenosis in the P1 segment bilaterally. 5. 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 6. Preliminary report texted to Dr. Curly Shores Electronically Signed   By: Franchot Gallo M.D.   On: 01/14/2020 16:42   Assessment/Plan The patient is a 74 year old male with multiple medical issues including past  medical history of CVA and TIA who presented to the [facility] emergency department complaining of right-sided weakness found to have acute  CVA  1.  Carotid Artery Disease: Patient with multiple risk factors for atherosclerotic disease as well as past medical history of past CVA and TIA.  Presents with acute CVA.  CTA neck was notable for "atherosclerotic disease the carotid bifurcation bilaterally without significant stenosis".  There is no indication for carotid intervention at this time however have recommended the patient follow-up with [name] in the outpatient setting for continued surveillance.  Recommend medical management including aspirin, Plavix and statin.   2.  Left Subclavian Artery Dissection: CTA was notable for "short segment dissection proximal left subclavian artery without significant stenosis".  Reviewed images with Dr. Lucky Cowboy.  Agree with radiologic interpretation - the segment is small and not impeding vertebral flow.  Highly doubt this is the cause of the patient's CVA.  Patient denies any left upper extremity discomfort.  Physical exam is unremarkable.  There is no indication for open/endovascular intervention at this time.  Would continue treat medically with aspirin and Plavix.  3. Tobacco Abuse: We had a discussion for approximately three minutes regarding the absolute need for smoking cessation due to the deleterious nature of tobacco on the vascular system. We discussed the tobacco use would diminish patency of any intervention, and likely significantly worsen progression of disease. We discussed multiple agents for quitting including replacement therapy or medications to reduce cravings such as Chantix. The patient voices their understanding of the importance of smoking cessation.  Discussed with Dr. Mayme Genta, PA-C  01/15/2020 1:08 PM  This note was created with Dragon medical transcription system.  Any error is purely unintentional.

## 2020-01-15 NOTE — ED Notes (Signed)
Pt given drink. Denies any other needs.

## 2020-01-15 NOTE — Evaluation (Signed)
Occupational Therapy Evaluation Patient Details Name: [name] MRN: [id number] DOB: 11/10/1945 Today's Date: 01/15/2020    History of Present Illness 74 year old male with a past medical history of cardiomyopathy, ESRD on peritoneal dialysis, hypertension, hyperlipidemia, TIA, who presented to the emergency department for right-sided numbness.  According to the patient since yesterday he has been experiencing a numbness sensation of his right face arm and flank.  Patient states the arm feels somewhat back to normal but he continues to have subjective numbness in the right face and flank. Patient denies any headache.  No neck or back pain.  Denies any fever.  Largely negative review of systems.  Patient does state a prior history of a TIA with left-sided deficits but that has since resolved   Clinical Impression   Patient presenting with decreased I in self care tasks, balance, functional mobility, endurance, and strength. Patient reports being independent PTA. Patient currently functioning at supervision overall without use of AD. Pt reports feeling "pretty close" to baseline. He does endorse just feeling "tired and a little weak". Pt reports living at home with wife who will be available 24/7 if needed.  Patient will benefit from acute OT to increase overall independence in the areas of ADLs, functional mobility, and safety awareness in order to safely discharge home with caregiver.    Follow Up Recommendations  No OT follow up;Supervision - Intermittent    Equipment Recommendations  Tub/shower seat    Recommendations for Other Services Other (comment) (none at this time)     Precautions / Restrictions Precautions Precautions: Fall      Mobility Bed Mobility Overal bed mobility: Modified Independent       Transfers Overall transfer level: Needs assistance Equipment used: None Transfers: Sit to/from Stand Sit to Stand: Supervision         General transfer comment:  supervision overall for safety with min cuing for technique    Balance Overall balance assessment: Needs assistance Sitting-balance support: Feet unsupported Sitting balance-Leahy Scale: Good     Standing balance support: During functional activity Standing balance-Leahy Scale: Fair            ADL either performed or assessed with clinical judgement   ADL Overall ADL's : Needs assistance/impaired      General ADL Comments: supervision overall for all self care tasks and transfers secondary to pt reports of feeling "weak and tired"     Vision Baseline Vision/History: Wears glasses Wears Glasses: Reading only Patient Visual Report: No change from baseline              Pertinent Vitals/Pain Pain Assessment: No/denies pain     Hand Dominance Right   Extremity/Trunk Assessment Upper Extremity Assessment Upper Extremity Assessment: Generalized weakness   Lower Extremity Assessment Lower Extremity Assessment: Defer to PT evaluation   Cervical / Trunk Assessment Cervical / Trunk Assessment: Normal   Communication Communication Communication: No difficulties   Cognition Arousal/Alertness: Awake/alert Behavior During Therapy: WFL for tasks assessed/performed Overall Cognitive Status: Within Functional Limits for tasks assessed                     Home Living Family/patient expects to be discharged to:: Private residence Living Arrangements: Spouse/significant other Available Help at Discharge: Family;Available 24 hours/day Type of Home: House Home Access: Stairs to enter [employer] of Steps: 2   Home Layout: One level     Bathroom Shower/Tub: Occupational psychologist: Standard  Home Equipment: None          Prior Functioning/Environment Level of Independence: Independent                 OT Problem List: Decreased strength;Decreased safety awareness;Decreased activity tolerance;Impaired balance (sitting and/or  standing)      OT Treatment/Interventions: Self-care/ADL training;Therapeutic exercise;Therapeutic activities;Energy conservation;Patient/family education;Balance training    OT Goals(Current goals can be found in the care plan section) Acute Rehab OT Goals Patient Stated Goal: to go home OT Goal Formulation: With patient Time For Goal Achievement: 01/29/20 Potential to Achieve Goals: Good ADL Goals Pt Will Perform Lower Body Dressing: with modified independence;sit to/from stand Pt Will Transfer to Toilet: with modified independence;ambulating Pt Will Perform Toileting - Clothing Manipulation and hygiene: with modified independence;sit to/from stand Pt Will Perform Tub/Shower Transfer: with modified independence;shower seat;ambulating  OT Frequency: Min 1X/week   Barriers to D/C: Other (comment)  none known at this time          AM-PAC OT "6 Clicks" Daily Activity     Outcome Measure Help from another person eating meals?: None Help from another person taking care of personal grooming?: None Help from another person toileting, which includes using toliet, bedpan, or urinal?: A Little Help from another person bathing (including washing, rinsing, drying)?: None Help from another person to put on and taking off regular upper body clothing?: None Help from another person to put on and taking off regular lower body clothing?: A Little 6 Click Score: 22   End of Session Equipment Utilized During Treatment: Rolling walker Nurse Communication: Mobility status  Activity Tolerance: Patient tolerated treatment well Patient left: with call bell/phone within reach;in bed  OT Visit Diagnosis: Muscle weakness (generalized) (M62.81)                Time: 9357-0177 OT Time Calculation (min): 20 min Charges:  OT General Charges $OT Visit: 1 Visit OT Evaluation $OT Eval Low Complexity: 1 Low OT Treatments $Self Care/Home Management : 8-22 mins  Darleen Crocker, MS, OTR/L , [facility] ascom  [phone]  01/15/20, 2:10 PM

## 2020-01-15 NOTE — Care Management CC44 (Signed)
Condition Code 44 Documentation Completed  Patient Details  Name: AYSON CHERUBINI MRN: [id number] Date of Birth: [birth year]   Condition Code 44 given:  Yes Patient signature on Condition Code 44 notice:  Yes Documentation of 2 MD's agreement:  Yes Code 44 added to claim:  Yes    Anselm Pancoast, RN 01/15/2020, 2:29 PM

## 2020-01-15 NOTE — Evaluation (Signed)
Physical Therapy Evaluation Patient Details Name: [name] MRN: [id number] DOB: 1946-02-16 Today's Date: 01/15/2020   History of Present Illness  Pt is a 74 y.o. male presenting to hospital 8/23 with numbness sensation R side of face, arm and flank.  MRI showing small acute infarcts involving L cerebral hemisphere (MCA and PCA territories) as well as posterior R temporal lobe.  CT angio head/neck showing short segment dissection proximal L subclavian artery.  PMH includes cardiomyopathy, ESRD on peritoneal dialysis, htn, HLD, TIA with L sided deficits (resolved), L LE neuropathy, and OSA.  Clinical Impression  Prior to hospital admission, pt was independent and active; lives with his wife on main level of home with 2 STE.  Currently pt is modified independent with bed mobility, independent with transfers, and SBA ambulating 160 feet (no AD).  Overall pt steady and safe with functional mobility but demonstrating generalized weakness.  HR 53-66 bpm during sessions activities.  BP 154/69 at rest beginning of session and BP increased to 189/78 post ambulation (nurse notified).  Pt would benefit from skilled PT to address noted impairments and functional limitations during hospital stay (see below for any additional details).  Upon hospital discharge, no further PT needs anticipated.    Follow Up Recommendations No PT follow up    Equipment Recommendations  None recommended by PT    Recommendations for Other Services       Precautions / Restrictions Precautions Precautions: Fall Precaution Comments: Peritoneal dialysis Restrictions Weight Bearing Restrictions: No      Mobility  Bed Mobility Overal bed mobility: Modified Independent             General bed mobility comments: Semi-supine to/from sitting edge of bed without any noted difficulties  Transfers Overall transfer level: Independent Equipment used: None Transfers: Sit to/from Stand Sit to Stand: Independent          General transfer comment: steady safe transfers noted  Ambulation/Gait Ambulation/Gait assistance: Supervision Gait Distance (Feet): 160 Feet (in ED room) Assistive device: None Gait Pattern/deviations: Step-through pattern Gait velocity: mildly decreased   General Gait Details: steady with all ambulation (including turns)  Science writer    Modified Rankin (Stroke Patients Only)       Balance Overall balance assessment: Modified Independent Sitting-balance support: No upper extremity supported;Feet supported Sitting balance-Leahy Scale: Normal Sitting balance - Comments: steady sitting reaching outside BOS   Standing balance support: No upper extremity supported;During functional activity Standing balance-Leahy Scale: Good Standing balance comment: steady with ambulation                             Pertinent Vitals/Pain Pain Assessment: No/denies pain     Home Living Family/patient expects to be discharged to:: Private residence Living Arrangements: Spouse/significant other Available Help at Discharge: Family;Available 24 hours/day Type of Home: House Home Access: Stairs to enter Entrance Stairs-Rails: None Entrance Stairs-Number of Steps: 2 Home Layout: Two level;Able to live on main level with bedroom/bathroom Home Equipment: None      Prior Function Level of Independence: Independent         Comments: Active     Hand Dominance   Dominant Hand: Right    Extremity/Trunk Assessment   Upper Extremity Assessment Upper Extremity Assessment: Defer to OT evaluation;Generalized weakness    Lower Extremity Assessment Lower Extremity Assessment: Generalized weakness (Intact B LE strength, light touch, proprioception,  tone, and heel to shin coordiantion)    Cervical / Trunk Assessment Cervical / Trunk Assessment: Normal  Communication   Communication: No difficulties  Cognition Arousal/Alertness:  Awake/alert Behavior During Therapy: WFL for tasks assessed/performed Overall Cognitive Status: Within Functional Limits for tasks assessed                                        General Comments   Nursing cleared pt for participation in physical therapy.  Pt agreeable to PT session.    Exercises     Assessment/Plan    PT Assessment Patient needs continued PT services  PT Problem List Decreased strength;Decreased activity tolerance;Decreased mobility       PT Treatment Interventions Gait training;Stair training;Functional mobility training;Therapeutic activities;Therapeutic exercise;Balance training;Patient/family education    PT Goals (Current goals can be found in the Care Plan section)  Acute Rehab PT Goals Patient Stated Goal: to go home PT Goal Formulation: With patient Time For Goal Achievement: 01/29/20 Potential to Achieve Goals: Good    Frequency Min 2X/week   Barriers to discharge        Co-evaluation               AM-PAC PT "6 Clicks" Mobility  Outcome Measure Help needed turning from your back to your side while in a flat bed without using bedrails?: None Help needed moving from lying on your back to sitting on the side of a flat bed without using bedrails?: None Help needed moving to and from a bed to a chair (including a wheelchair)?: None Help needed standing up from a chair using your arms (e.g., wheelchair or bedside chair)?: None Help needed to walk in hospital room?: A Little Help needed climbing 3-5 steps with a railing? : A Little 6 Click Score: 22    End of Session Equipment Utilized During Treatment: Gait belt (gait belt up high away from peritoneal dialysis site) Activity Tolerance: Patient tolerated treatment well Patient left: in bed;with call bell/phone within reach;with bed alarm set Nurse Communication: Mobility status;Precautions;Other (comment) (pt's vitals including increased BP to 189/78 post ambulation) PT  Visit Diagnosis: Muscle weakness (generalized) (M62.81)    Time: 2395-3202 PT Time Calculation (min) (ACUTE ONLY): 21 min   Charges:   PT Evaluation $PT Eval Low Complexity: 1 Low         Jood Retana, PT 01/15/20, 3:10 PM

## 2020-01-15 NOTE — ED Notes (Addendum)
Provider End with cardiology at bedside with pt.

## 2020-01-15 NOTE — Progress Notes (Signed)
[facility]  ROUNDING NOTE   Subjective:  74 y.o.malemale with past medical history significant for diabetes (A1c 5.7%) hypertension, hyperlipidemia, cardiomyopathy (8/20 echo EF 40 to 45%, with some evidence of likely prior ischemia), renal cell carcinoma of left kidney heminephrectomy complicated by end-stage renal disease on peritoneal dialysis, ongoing tobacco abuse (1 pack/day) for coronary disease, headache.  He presented with a few hours of right hand clumsiness that resolved as well as right-sided numbness. Patient seen in the ED,found resting in bed, in no acute distress, discussed plans about peritoneal dialysis while hospitalized.   Objective:  Vital signs in last 24 hours:  Temp:  [98.6 F (37 C)] 98.6 F (37 C) (08/24 0122) Pulse Rate:  [52-78] 66 (08/24 1130) Resp:  [13-26] 16 (08/24 1130) BP: (148-212)/(58-84) 160/72 (08/24 1130) SpO2:  [95 %-100 %] 98 % (08/24 1130)  Weight change:  Filed Weights   01/14/20 0811  Weight: 63.5 kg    Intake/Output: No intake/output data recorded.   Intake/Output this shift:  Total I/O In: -  Out: 1400 [Urine:1400]  Physical Exam: General:  No acute distress  Head:  Normocephalic, atraumatic. Moist oral mucosal membranes  Eyes:  Anicteric  Neck:  Supple  Lungs:   Lungs clear, Normal symmetrical respirations  Heart:  S1S2 no rubs  Abdomen:   Soft, nontender, bowel sounds present  Extremities:  No peripheral edema.  Neurologic:  Awake, alert, oriented x 4  Skin:  No lesions  Access:  Peritoneal site with dressing CDI    Basic Metabolic Panel: Recent Labs  Lab 01/14/20 0813  NA 139  K 3.7  CL 102  CO2 23  GLUCOSE 121*  BUN 62*  CREATININE 7.60*  CALCIUM 8.8*    Liver Function Tests: Recent Labs  Lab 01/14/20 0813  AST 9*  ALT 10  ALKPHOS 101  BILITOT 0.9  PROT 6.6  ALBUMIN 3.3*   No results for input(s): LIPASE, AMYLASE in the last 168 hours. No results for input(s): AMMONIA in the last  168 hours.  CBC: Recent Labs  Lab 01/14/20 0813  WBC 12.2*  NEUTROABS 7.9*  HGB 11.6*  HCT 32.6*  MCV 85.1  PLT 215    Cardiac Enzymes: No results for input(s): CKTOTAL, CKMB, CKMBINDEX, TROPONINI in the last 168 hours.  BNP: Invalid input(s): POCBNP  CBG: No results for input(s): GLUCAP in the last 168 hours.  Microbiology: Results for orders placed or performed during the hospital encounter of 01/14/20  SARS Coronavirus 2 by RT PCR (hospital order, performed in [facility] hospital lab) Nasopharyngeal Nasopharyngeal Swab     Status: None   Collection Time: 01/14/20  4:54 PM   Specimen: Nasopharyngeal Swab  Result Value Ref Range Status   SARS Coronavirus 2 NEGATIVE NEGATIVE Final    Comment: (NOTE) SARS-CoV-2 target nucleic acids are NOT DETECTED.  The SARS-CoV-2 RNA is generally detectable in upper and lower respiratory specimens during the acute phase of infection. The lowest concentration of SARS-CoV-2 viral copies this assay can detect is 250 copies / mL. A negative result does not preclude SARS-CoV-2 infection and should not be used as the sole basis for treatment or other patient management decisions.  A negative result may occur with improper specimen collection / handling, submission of specimen other than nasopharyngeal swab, presence of viral mutation(s) within the areas targeted by this assay, and inadequate number of viral copies (<250 copies / mL). A negative result must be combined with clinical observations, patient history, and epidemiological  information.  Fact Sheet for Patients:   StrictlyIdeas.no  Fact Sheet for Healthcare Providers: BankingDealers.co.za  This test is not yet approved or  cleared by the [place] FDA and has been authorized for detection and/or diagnosis of SARS-CoV-2 by FDA under an Emergency Use Authorization (EUA).  This EUA will remain in effect (meaning this test can be  used) for the duration of the COVID-19 declaration under Section 564(b)(1) of the Act, 21 U.S.C. section 360bbb-3(b)(1), unless the authorization is terminated or revoked sooner.  Performed at [facility], [address] [place], [place] [address]     Coagulation Studies: Recent Labs    01/14/20 0813  LABPROT 12.7  INR 1.0    Urinalysis: No results for input(s): COLORURINE, LABSPEC, PHURINE, GLUCOSEU, HGBUR, BILIRUBINUR, KETONESUR, PROTEINUR, UROBILINOGEN, NITRITE, LEUKOCYTESUR in the last 72 hours.  Invalid input(s): APPERANCEUR    Imaging: CT HEAD WO CONTRAST  Result Date: 01/14/2020 CLINICAL DATA:  Home with numbness on right side to rib cage, neck and shoulder since 6pm last night. Pt did his in home dialysis last pm. EXAM: CT HEAD WITHOUT CONTRAST TECHNIQUE: Contiguous axial images were obtained from the base of the skull through the vertex without intravenous contrast. COMPARISON:  01/03/2012 FINDINGS: Brain: No evidence of acute infarction, hemorrhage, extra-axial collection, ventriculomegaly, or mass effect. Generalized cerebral atrophy. Periventricular white matter low attenuation likely secondary to microangiopathy. Vascular: Cerebrovascular atherosclerotic calcifications are noted. Skull: Negative for fracture or focal lesion. Sinuses/Orbits: Visualized portions of the orbits are unremarkable. Visualized portions of the paranasal sinuses are unremarkable. Visualized portions of the mastoid air cells are unremarkable. Other: None. IMPRESSION: 1. No acute intracranial pathology. 2. Chronic microvascular disease and cerebral atrophy. Electronically Signed   By: Kathreen Devoid   On: 01/14/2020 09:07   MR BRAIN WO CONTRAST  Result Date: 01/14/2020 CLINICAL DATA:  Right-sided numbness EXAM: MRI HEAD WITHOUT CONTRAST TECHNIQUE: Multiplanar, multiecho pulse sequences of the brain and surrounding structures were obtained without intravenous contrast. COMPARISON:  None.  FINDINGS: Brain: There are scattered small foci of reduced diffusion in the left cerebral hemisphere involving expected MCA and PCA territories as well as the posterior right temporal lobe. There is no intracranial mass, mass effect, or edema. There is no hydrocephalus or extra-axial fluid collection. Prominence of the ventricles and sulci reflects minor generalized parenchymal volume loss. Patchy and confluent areas of T2 hyperintensity in the supratentorial white matter nonspecific but probably reflect mild to moderate chronic microvascular ischemic changes. Chronic infarcts of the right basal ganglia. Chronic left occipital infarct with chronic blood products. Small chronic left cerebellar infarct. Scattered small foci of susceptibility hypointensity in the cerebral white matter most compatible with chronic microhemorrhages. Vascular: Major vessel flow voids at the skull base are preserved. Skull and upper cervical spine: Normal marrow signal is preserved. Sinuses/Orbits: Paranasal sinuses are aerated. Orbits are unremarkable. Other: Sella is unremarkable.  Mastoid air cells are clear. IMPRESSION: Small acute infarcts involving left cerebral hemisphere (MCA and PCA territories) as well as posterior right temporal lobe. Several chronic infarcts as described. Mild to moderate chronic microvascular ischemic changes. Minor burden of chronic microhemorrhages. Electronically Signed   By: Macy Mis M.D.   On: 01/14/2020 14:47   ECHOCARDIOGRAM COMPLETE BUBBLE STUDY  Result Date: 01/15/2020    ECHOCARDIOGRAM REPORT   Patient Name:   AULDEN CALISE Date of Exam: 01/14/2020 Medical Rec #:  [id number]       Height:       66.0 in Accession #:  [id number]      Weight:       140.0 lb Date of Birth:  [birth year]       BSA:          1.719 m Patient Age:    50 years        BP:           191/83 mmHg Patient Gender: M               HR:           55 bpm. Exam Location:  [facility] Procedure: 2D Echo, Cardiac Doppler and Color  Doppler Indications:     Stroke 434.91 / I63.9  History:         Patient has prior history of Echocardiogram examinations.                  Cardiomyopathy, TIA; Risk Factors:Hypertension.  Sonographer:     Alyse Low Roar Referring Phys:  [id number] Lorenza Chick Diagnosing Phys: [name] MD IMPRESSIONS  1. Left ventricular ejection fraction, by estimation, is 40 to 45%. The left ventricle has mildly decreased function. The left ventricle demonstrates global hypokinesis. There is moderate left ventricular hypertrophy. Left ventricular diastolic parameters are consistent with Grade I diastolic dysfunction (impaired relaxation). Elevated left atrial pressure.  2. Right ventricular systolic function is normal. The right ventricular size is normal. Mildly increased right ventricular wall thickness.  3. Left atrial size was severely dilated.  4. The mitral valve is degenerative. Mild mitral valve regurgitation. No evidence of mitral stenosis.  5. Unable to determine valve morphology due to image quality. Aortic valve regurgitation is mild. Mild to moderate aortic valve sclerosis/calcification is present, without any evidence of aortic stenosis.  6. Aortic dilatation noted. There is mild dilatation of the ascending aorta measuring 37 mm.  7. The inferior vena cava is normal in size with greater than 50% respiratory variability, suggesting right atrial pressure of 3 mmHg.  8. Agitated saline contrast bubble study was negative, with no evidence of any interatrial shunt. FINDINGS  Left Ventricle: Left ventricular ejection fraction, by estimation, is 40 to 45%. The left ventricle has mildly decreased function. The left ventricle demonstrates global hypokinesis. The left ventricular internal cavity size was normal in size. There is  moderate left ventricular hypertrophy. Left ventricular diastolic parameters are consistent with Grade I diastolic dysfunction (impaired relaxation). Elevated left atrial pressure. Right  Ventricle: The right ventricular size is normal. Mildly increased right ventricular wall thickness. Right ventricular systolic function is normal. Left Atrium: Left atrial size was severely dilated. Right Atrium: Right atrial size was normal in size. Pericardium: Trivial pericardial effusion is present. Mitral Valve: The mitral valve is degenerative in appearance. Mild mitral annular calcification. Mild mitral valve regurgitation. No evidence of mitral valve stenosis. Tricuspid Valve: The tricuspid valve is not well visualized. Tricuspid valve regurgitation is not demonstrated. Aortic Valve: Unable to determine valve morphology due to image quality.. There is mild thickening and mild calcification of the aortic valve. Aortic valve regurgitation is mild. Mild to moderate aortic valve sclerosis/calcification is present, without any evidence of aortic stenosis. There is mild thickening of the aortic valve. There is mild calcification of the aortic valve. Aortic valve peak gradient measures 6.0 mmHg. Pulmonic Valve: The pulmonic valve was not well visualized. Pulmonic valve regurgitation is mild. No evidence of pulmonic stenosis. Aorta: Aortic dilatation noted. There is mild dilatation of the ascending aorta measuring 37 mm. Pulmonary Artery: The pulmonary artery is  not well seen. Venous: The inferior vena cava is normal in size with greater than 50% respiratory variability, suggesting right atrial pressure of 3 mmHg. IAS/Shunts: The interatrial septum was not well visualized. Agitated saline contrast bubble study was negative, with no evidence of any interatrial shunt.  LEFT VENTRICLE PLAX 2D LVIDd:         5.48 cm      Diastology LVIDs:         4.38 cm      LV e' lateral:   3.05 cm/s LV PW:         1.32 cm      LV E/e' lateral: 18.3 LV IVS:        1.38 cm      LV e' medial:    3.48 cm/s LVOT diam:     2.20 cm      LV E/e' medial:  16.0 LVOT Area:     3.80 cm  LV Volumes (MOD) LV vol d, MOD A4C: 129.0 ml LV vol s, MOD  A4C: 78.9 ml LV SV MOD A4C:     129.0 ml RIGHT VENTRICLE RV Mid diam:    3.16 cm RV S prime:     14.40 cm/s TAPSE (M-mode): 2.6 cm LEFT ATRIUM              Index LA diam:        4.70 cm  2.73 cm/m LA Vol (A2C):   86.7 ml  50.45 ml/m LA Vol (A4C):   111.0 ml 64.59 ml/m LA Biplane Vol: 105.0 ml 61.10 ml/m  AORTIC VALVE                PULMONIC VALVE AV Area (Vmax): 2.46 cm    PV Vmax:       0.71 m/s AV Vmax:        122.00 cm/s PV Peak grad:  2.0 mmHg AV Peak Grad:   6.0 mmHg LVOT Vmax:      79.00 cm/s  AORTA Ao Root diam: 3.70 cm MITRAL VALVE MV Area (PHT): 6.12 cm     SHUNTS MV Decel Time: 124 msec     Systemic Diam: 2.20 cm MV E velocity: 55.70 cm/s MV A velocity: 119.00 cm/s MV E/A ratio:  0.47 MV A Prime:    9.2 cm/s [name] MD Electronically signed by [name] MD Signature Date/Time: 01/15/2020/1:23:28 PM    Final    CT ANGIO HEAD CODE STROKE  Result Date: 01/14/2020 CLINICAL DATA:  Acute neuro deficit. Right-sided numbness. Acute infarct on MRI. EXAM: CT ANGIOGRAPHY HEAD AND NECK TECHNIQUE: Multidetector CT imaging of the head and neck was performed using the standard protocol during bolus administration of intravenous contrast. Multiplanar CT image reconstructions and MIPs were obtained to evaluate the vascular anatomy. Carotid stenosis measurements (when applicable) are obtained utilizing NASCET criteria, using the distal internal carotid diameter as the denominator. CONTRAST:  19mL OMNIPAQUE IOHEXOL 350 MG/ML SOLN COMPARISON:  CT head and MRI head 01/14/2020 FINDINGS: CTA NECK FINDINGS Aortic arch: Extensive atherosclerotic disease in the aortic arch without aneurysm. Atherosclerotic disease in the proximal great vessels without significant stenosis. Short segment dissection of the proximal left subclavian artery without stenosis. Right carotid system: Atherosclerotic disease throughout the right common carotid artery and right carotid bifurcation. No significant stenosis. Left carotid  system: Atherosclerotic disease throughout the left common carotid artery and left carotid bifurcation. Calcified and noncalcified plaque in the proximal left internal carotid artery without significant stenosis. Vertebral arteries: Mild  stenosis proximal right vertebral artery which is patent to the basilar. Mild stenosis proximal left vertebral artery. Moderate calcific stenosis distal left vertebral artery at the skull base. Skeleton: Prominent degenerative changes in the facet joints on the left. No acute skeletal abnormality. Other neck: No mass or adenopathy in the neck. Upper chest: Apical emphysema without acute abnormality. Review of the MIP images confirms the above findings CTA HEAD FINDINGS Anterior circulation: Mild atherosclerotic calcification cavernous carotid bilaterally without significant stenosis. Anterior and middle cerebral arteries patent without significant stenosis or large vessel occlusion. Posterior circulation: Moderate stenosis left vertebral artery at the skull base. Both vertebral arteries are patent to the basilar. PICA patent bilaterally. Basilar patent without stenosis. AICA, superior cerebellar, and posterior cerebral arteries are patent. Mild stenosis in the P2 segment bilaterally. Venous sinuses: Normal venous enhancement Anatomic variants: None Review of the MIP images confirms the above findings IMPRESSION: 1. Extensive atherosclerotic disease. Aortic Atherosclerosis (ICD10-I70.0). 2. Atherosclerotic disease the carotid bifurcation bilaterally without significant stenosis. 3. Mild stenosis in the proximal vertebral artery bilaterally. Moderate calcific stenosis distal left vertebral artery. 4. Negative for intracranial large vessel occlusion or flow limiting stenosis. Mild stenosis in the P1 segment bilaterally. 5. 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 6. Preliminary report texted to Dr. Curly Shores Electronically Signed   By: Franchot Gallo M.D.   On: 01/14/2020 16:42   CT ANGIO NECK CODE STROKE  Result Date: 01/14/2020 CLINICAL DATA:  Acute neuro deficit. Right-sided numbness. Acute infarct on MRI. EXAM: CT ANGIOGRAPHY HEAD AND NECK TECHNIQUE: Multidetector CT imaging of the head and neck was performed using the standard protocol during bolus administration of intravenous contrast. Multiplanar CT image reconstructions and MIPs were obtained to evaluate the vascular anatomy. Carotid stenosis measurements (when applicable) are obtained utilizing NASCET criteria, using the distal internal carotid diameter as the denominator. CONTRAST:  35mL OMNIPAQUE IOHEXOL 350 MG/ML SOLN COMPARISON:  CT head and MRI head 01/14/2020 FINDINGS: CTA NECK FINDINGS Aortic arch: Extensive atherosclerotic disease in the aortic arch without aneurysm. Atherosclerotic disease in the proximal great vessels without significant stenosis. Short segment dissection of the proximal left subclavian artery without stenosis. Right carotid system: Atherosclerotic disease throughout the right common carotid artery and right carotid bifurcation. No significant stenosis. Left carotid system: Atherosclerotic disease throughout the left common carotid artery and left carotid bifurcation. Calcified and noncalcified plaque in the proximal left internal carotid artery without significant stenosis. Vertebral arteries: Mild stenosis proximal right vertebral artery which is patent to the basilar. Mild stenosis proximal left vertebral artery. Moderate calcific stenosis distal left vertebral artery at the skull base. Skeleton: Prominent degenerative changes in the facet joints on the left. No acute skeletal abnormality. Other neck: No mass or adenopathy in the neck. Upper chest: Apical emphysema without acute abnormality. Review of the MIP images confirms the above findings CTA HEAD FINDINGS Anterior circulation: Mild atherosclerotic calcification cavernous carotid bilaterally without  significant stenosis. Anterior and middle cerebral arteries patent without significant stenosis or large vessel occlusion. Posterior circulation: Moderate stenosis left vertebral artery at the skull base. Both vertebral arteries are patent to the basilar. PICA patent bilaterally. Basilar patent without stenosis. AICA, superior cerebellar, and posterior cerebral arteries are patent. Mild stenosis in the P2 segment bilaterally. Venous sinuses: Normal venous enhancement Anatomic variants: None Review of the MIP images confirms the above findings IMPRESSION: 1. Extensive atherosclerotic disease. Aortic Atherosclerosis (ICD10-I70.0). 2. Atherosclerotic disease the carotid bifurcation bilaterally without significant stenosis. 3. Mild stenosis in  the proximal vertebral artery bilaterally. Moderate calcific stenosis distal left vertebral artery. 4. Negative for intracranial large vessel occlusion or flow limiting stenosis. Mild stenosis in the P1 segment bilaterally. 5. 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 6. Preliminary report texted to Dr. Curly Shores Electronically Signed   By: Franchot Gallo M.D.   On: 01/14/2020 16:42     Medications:   . dialysis solution 2.5% low-MG/low-CA     .  stroke: mapping our early stages of recovery book   Does not apply Once  . amLODipine  10 mg Oral Daily  . [START ON 01/16/2020] aspirin EC  81 mg Oral Daily  . atorvastatin  40 mg Oral Nightly  . [START ON 01/16/2020] calcitRIOL  0.25 mcg Oral Q M,W,F  . carvedilol  6.25 mg Oral BID WC  . cloNIDine  0.6 mg Transdermal Weekly  . clopidogrel  300 mg Oral Once  . [START ON 01/16/2020] clopidogrel  75 mg Oral Daily  . gentamicin cream  1 application Topical Daily  . heparin injection (subcutaneous)  5,000 Units Subcutaneous Q8H  . ipratropium-albuterol  3 mL Nebulization Q6H  . losartan  100 mg Oral Daily  . nicotine  14 mg Transdermal Daily  . sodium bicarbonate  325 mg Oral TID    acetaminophen **OR** acetaminophen (TYLENOL) oral liquid 160 mg/5 mL **OR** acetaminophen, hydrALAZINE, iohexol  Assessment/ Plan:  74 y.o. male with diabetes (A1c 5.7%) hypertension, hyperlipidemia, cardiomyopathy (8/20 echo EF 40 to 45%, with some evidence of likely prior ischemia), renal cell carcinoma of left kidney heminephrectomy complicated by end-stage renal disease on peritoneal dialysis, ongoing tobacco abuse (1 pack/day) for coronary disease, headache.  He presented with right sided numbness  #ESRD #Hypertension #Acute CVA #Anemia of chronic kidney disease. #Secondary hyperparathyroidism. Case discussed with hospitalist.  Overall CVA improved.  Numbness on the right side improving.  Cardiology neurology to decide upon additional treatment regarding blood thinners and secondary prevention.  Maintain the patient on carvedilol, clonidine, and losartan.  He will resume peritoneal dialysis tonight at home.   LOS: 1 Chaz Ronning 8/24/20212:39 PM

## 2020-01-15 NOTE — Care Management CC44 (Signed)
Condition Code 44 Documentation Completed  Patient Details  Name: GRABIEL SCHMUTZ MRN: [id number] Date of Birth: January 09, 1946   Condition Code 44 given:  Yes Patient signature on Condition Code 44 notice:  Yes Documentation of 2 MD's agreement:  Yes Code 44 added to claim:  Yes    Anselm Pancoast, RN 01/15/2020, 2:29 PM

## 2020-01-15 NOTE — Progress Notes (Addendum)
Neurology Progress Note  Patient ID: [name] is a 74 y.o. male man with past medical history significant for diabetes (A1c 5.7%) hypertension, hyperlipidemia, cardiomyopathy (8/20 echo EF 40 to 45%, with some evidence of likely prior ischemia), renal cell carcinoma of left kidney heminephrectomy complicated by end-stage renal disease on peritoneal dialysis, ongoing tobacco abuse (1 pack/day) for coronary disease, headache.  He presented with a few hours of right hand clumsiness that resolved as well as right-sided numbness.  Major interval events/Subjective: -Blood pressures have been ranging up into the 200s.  Patient reports that his home blood pressure typically is in the 160-180s / 80s to 90s and that he does not tolerate lower blood pressures due to extreme fatigue.  He does note that he has blood pressures up into the 200s at least once every week or two, and checks his blood pressure at home at least daily.  He also reports he cannot take all of his blood pressure medications simultaneously but rather spaces them throughout the day to avoid symptomatic relative hypotension. -He reports that the numbness on his head and neck as well as his trunk are improving both in the amount of area affected as well as the intensity of the numbness.  Exam: Vitals:   01/15/20 0300 01/15/20 0500  BP:    Pulse: 61 65  Resp: 14 17  Temp:    SpO2:     Gen: In bed, NAD Psychiatric: Affect appropriate, cooperative Resp: non-labored breathing, no acute distress Abd: soft, nt Extremities: Warm and well perfused  Neuro: MS: Alert, oriented to person, place, situation, and time.  Able to name all the months of the year backwards quickly and correctly.  Able to follow complex commands.  Able to name and repeat. CN: Pupils equal round reactive to light, right very minimally bigger than the left but both reactive.  External ocular movements with mildly saccadic pursuits but intact, some initial torsional  and left beating nystagmus on left gaze which quickly extinguished.  Facial sensation intact in all 3 distributions, face symmetric, tongue midline, uvula elevates symmetrically, and head turn bilaterally 5 out of 5 Motor: No pronator drift in any of his extremities, minimal clumsiness of the right hand with tapping sequential fingers, bilateral lower extremities briskly antigravity, no truncal ataxia when seated. Sensory: Improving as described above in subjective, no extinction to double simultaneous stimuli in the arms or legs, Condition: Intact finger-to-nose and toe to hand bilaterally  Pertinent Labs: Hemoglobin A1c 5.7 Total cholesterol 173, HDL 22, LDL 111, triglycerides 202 (fasting level) ESR 49 TSH 3.479 SARS-CoV-2 negative  Echocardiogram complete, results pending  Impression: [name] is a 74 year old gentleman with multiple vascular risk factors presenting with multifocal punctate strokes with a central embolic source which can only be the aortic arch or the heart.   Given the ulcerated and very irregular appearance of his significant aortic arch atherosclerosis, appreciate cardiology recommendations regarding management today.  Favor at least a 21-day course of DAPT per CHANCE/POINT trial for low NIH stroke scale neurologic event, but only if anticoagulation is not recommended.  If cardiology favors longer course of dual antiplatelet therapy, defer to their recommendations.  Recommendations: #Acute punctate strokes in multiple vascular territories, aortic arch atheroembolic versus cardioembolic -Continue every 4 hours neuro checks -Continue telemetry, 30-day event monitor at discharge if no arrhythmias detected during admission -21-day course of aspirin and Plavix after loading with 300 mg of Plavix DAPT unless cardiology suggests anticoagulation or a longer course  of DAPT -Lifelong aspirin 81 mg -Continue atorvastatin 40 mg nightly -Continue risk factor modification  (smoking cessation counseling, diet counseling) -Appreciate cardiology involvement for severe aortic arch atheroma  #Hypertension -At this time, given no evidence of large vessel occlusion and small strokes, no neurological indication for permissive hypertension.  However, should the patient develop new neurological deficits, will allow permissive hypertension until repeat neurological evaluation has been completed -Blood pressure will likely be challenging to manage given a baseline that he reports, defer goals to cardiology and primary team  [place] [phone]

## 2020-01-15 NOTE — ED Notes (Signed)
Pt switched to hospital bed. Pt denies pain at this time. Pt family at bedside. Pt given warm blankets and coffee.

## 2020-01-15 NOTE — Consult Note (Addendum)
Cardiology Consult    Patient ID: AKASHDEEP CHUBA MRN: [id number], DOB/AGE: July 24, 1945   Admit date: 01/14/2020 Date of Consult: 01/15/2020  Primary Physician: Crecencio Mc, MD Primary Cardiologist: [name], MD Requesting Provider: R. Leslye Peer, MD  Patient Profile    [name] is a 74 y.o. male with a history of end-stage renal disease on peritoneal dialysis, cardiomyopathy with an EF of 40-45%, abnormal stress test, hypertension, hyperlipidemia, peripheral arterial disease, sleep apnea, renal cell carcinoma status post left nephrectomy, ongoing tobacco abuse, and adenocarcinoma of the appendix, who is being seen today for the evaluation of stroke w/ finding of Ao atherosclerosis at the request of Dr. Leslye Peer.  Past Medical History   Past Medical History:  Diagnosis Date  . Adenocarcinoma of appendix [facility]) Jan 2006   right kidney, s/p cryoablation  . Cardiomyopathy ([place])    a. 12/2018 Echo: EF 40-45%, global HK. Nl RV fxn. Mild-mod LAE. Mild AI, Mild-mod MR. Asc Ao 3.7cm; b. 01/2019 Lexi MV: small, mild, fixed basal and mid antlat defect - scar vs artifact. Small, mild mid and apical inf minimally reversible defect, likely scar w/ peri-infarct ischemia. Coronary and Ao atherosclerosis.  . Complication of anesthesia    had to be woken up slowly as his bp was elevated when did this quickly  . Diabetes mellitus without complication ([place])   . ESRD (end stage renal disease) ([place])    a. Peritoneal Dialysis pt.  . Hyperlipidemia   . Hypertension   . Migraine    cluster  . Neuromuscular disorder ([place])    left lower extrem neuropathy  . Obstructive sleep apnea    no OSA since had facial surgery with dr. Kathyrn Sheriff in 1997  . PAD (peripheral artery disease) [facility]) Feb 2009   nonobstructing, renal angiogram ([place])  . Renal cell carcinoma 2004   left kidney heminephrectomy  . Renal insufficiency   . TIA (transient ischemic attack)    no residual but left leg and foot still  feel heavy  . tobacco abuse     Past Surgical History:  Procedure Laterality Date  . CAPD INSERTION N/A 03/01/2019   Procedure: LAPAROSCOPIC INSERTION CONTINUOUS AMBULATORY PERITONEAL DIALYSIS  (CAPD) CATHETER;  Surgeon: Algernon Huxley, MD;  Location: [facility] ORS;  Service: Vascular;  Laterality: N/A;  . CARDIAC CATHETERIZATION     Dr. [name] did this to assess his renal artery  . cyst removal  12/25/2015   Spine L4 and L5  . heminephrectomy  2004   for renal cell CA  . RENAL CRYOABLATION  Jan 2006   right kidney,  [name]  . sciatica       Allergies  Allergies  Allergen Reactions  . Irbesartan Other (See Comments)    hyperkalemia  . Cymbalta [Duloxetine Hcl]   . Atorvastatin Other (See Comments)    Muscle pain  . Bystolic [Nebivolol Hcl]     Extreme fatigue     History of Present Illness    74 year old male with the above complex past medical history including end-stage renal disease on peritoneal dialysis, cardiomyopathy with an EF of 40-45%, hypertension, hyperlipidemia, peripheral arterial disease, sleep apnea, renal cell carcinoma status post left nephrectomy, and carcinoid of the appendix.  He was previously evaluated in August 2020 for preoperative evaluation pending peritoneal dialysis catheter placement.  In the setting of chronic dyspnea, echocardiogram was performed and showed an EF of 40-45% global hypokinesis, mild AI, mild to moderate MR, and mild dilation of the ascending aorta  at 3.7 cm.  This was followed by stress testing which showed a small, mild, fixed basal and mid anterolateral defect as well as a small, mild, mid and apical inferior minimally reversible defect which was felt to be scar with peri-infarct ischemia.  The patient was not having any symptoms and with renal failure with recent initiation of peritoneal dialysis, decision was made to continue medical therapy.  He was last seen in cardiology clinic (telemedicine visit), at which time he was feeling well though  continues have issues with hypertension resulting in a change to his doxazosin dose, which he is no longer taking.  From a cardiac standpoint, patient says he has been relatively stable.  He says his blood pressure continues to run high at home.  His chronic dyspnea exertion which she thinks is progressed some over the past few months.  He denies any chest pain.  He lives locally with his wife.  He continues to smoke between a pack and 2 packs/day depending on whether or not he is busy.  He was in his usual state of health until Sunday, August 22, when he was getting ready to go to a friend's house and noted lack of coordination of his right hand and arm as he was trying to tie his shoe.  This lasted for several hours but subsequently resolved.  He went to his friend's house for cookout and then after returning home noticed numbness along his right neck and up to his right ear.  He also noted right flank numbness.  He has no extremity numbness or weakness and no change in mental status, vision, or hearing.  Due to the symptoms, he presented to the emergency department on the morning of August 23.  Here, head CT showed chronic microvascular disease without acute intracranial findings.  MRI however showed small acute infarcts involving the left cerebral hemisphere (MCA and PCA territories, as well as posterior right temporal lobe.  Several chronic infarcts are also noted.  CT angio of the head and neck showed extensive atherosclerotic disease involving the aorta, carotids, and vertebrals.  Short segment dissection of the proximal left subclavian is also noted without stenosis.  Per neurology note, severe level 5 aortic arch atheroma with concern for ulcerated plaque and possible thrombus extending into the lumen was also reported, though I do not see this in the body of the result note.  Patient currently complains of numbness involving his right ear but is otherwise asymptomatic and hoping to go home.   Echocardiogram has been performed and is similar to prior echo with an EF of 40-45%, moderate LVH, global hypokinesis, grade 1 diastolic dysfunction  Inpatient Medications    .  stroke: mapping our early stages of recovery book   Does not apply Once  . amLODipine  10 mg Oral Daily  . aspirin EC  325 mg Oral Daily  . atorvastatin  40 mg Oral Nightly  . [START ON 01/16/2020] calcitRIOL  0.25 mcg Oral Q M,W,F  . carvedilol  6.25 mg Oral BID WC  . cloNIDine  0.6 mg Transdermal Weekly  . gentamicin cream  1 application Topical Daily  . heparin injection (subcutaneous)  5,000 Units Subcutaneous Q8H  . ipratropium-albuterol  3 mL Nebulization Q6H  . losartan  100 mg Oral Daily  . nicotine  14 mg Transdermal Daily  . sodium bicarbonate  325 mg Oral TID    Family History    Family History  Problem Relation Age of Onset  . Hypertension  Mother   . Cancer Mother        breast  . Aneurysm Mother   . Coronary artery disease Father   . Hypertension Father   . Stroke Father 69  . Heart disease Father   . Heart attack Father 42  . Aneurysm Maternal Grandmother        brain  . Aneurysm Paternal Grandmother        brain  . Coronary artery disease Paternal Grandfather   . Heart disease Brother        valvular heart disease  . COPD Brother   . Hypertension Brother   . Stroke Paternal Uncle    He indicated that his mother is deceased. He indicated that his father is deceased. He indicated that his brother is deceased. He indicated that his maternal grandmother is deceased. He indicated that his maternal grandfather is deceased. He indicated that the status of his paternal grandmother is unknown. He indicated that the status of his paternal grandfather is unknown. He indicated that his paternal uncle is deceased.   Social History    Social History   Socioeconomic History  . Marital status: Married    Spouse name: Bailey Mech  . Number of children: Not on file  . Years of education: Not on  file  . Highest education level: Not on file  Occupational History  . 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 Smoking status: Current Every Day Smoker    Packs/day: 1.00    Years: 50.00    Pack years: 50.00    Types: Cigarettes  . Smokeless tobacco: Never Used  Vaping Use  . Vaping Use: Former  . Devices: tried but did not like  Substance and Sexual Activity  . Alcohol use: Not Currently  . Drug use: No  . Sexual activity: Yes  Other Topics Concern  . Not on file  Social History Narrative  . Not on file   Social Determinants of Health   Financial Resource Strain:   . Difficulty of Paying Living Expenses: Not on file  Food Insecurity:   . Worried About Charity fundraiser in the Last Year: Not on file  . Ran Out of Food in the Last Year: Not on file  Transportation Needs:   . Lack of Transportation (Medical): Not on file  . Lack of Transportation (Non-Medical): Not on file  Physical Activity:   . Days of Exercise per Week: Not on file  . Minutes of Exercise per Session: Not on file  Stress:   . Feeling of Stress : Not on file  Social Connections:   . Frequency of Communication with Friends and Family: Not on file  . Frequency of Social Gatherings with Friends and Family: Not on file  . Attends Religious Services: Not on file  . Active Member of Clubs or Organizations: Not on file  . Attends Archivist Meetings: Not on file  . Marital Status: Not on file  Intimate Partner Violence:   . Fear of Current or Ex-Partner: Not on file  . Emotionally Abused: Not on file  . Physically Abused: Not on file  . Sexually Abused: Not on file     Review of Systems    General:  No chills, fever, night sweats or weight changes.  Cardiovascular:  No chest pain, +++ dyspnea on exertion-this is chronic but worse over the past few months.  No edema, orthopnea, palpitations, paroxysmal nocturnal dyspnea. Dermatological: No rash,  lesions/masses  Respiratory: No cough, he does have chronic dyspnea exertion which is worsened over the past few months. Urologic: No hematuria, dysuria.  On peritoneal dialysis but still makes urine. Abdominal:   No nausea, vomiting, diarrhea, bright red blood per rectum, melena, or hematemesis Neurologic:  No visual changes, wkns, changes in mental status. All other systems reviewed and are otherwise negative except as noted above.  Physical Exam    Blood pressure (!) 160/72, pulse 66, temperature 98.6 F (37 C), temperature source Oral, resp. rate 16, height 5\' 6"  (1.676 m), weight 63.5 kg, SpO2 98 %.  General: Pleasant, NAD Psych: Normal affect. Neuro: Alert and oriented X 3. Moves all extremities spontaneously.  Reports numbness along the right face/ear. HEENT: Normal  Neck: Supple without bruits or JVD. Lungs:  Resp regular and unlabored, diminished breath sounds bilaterally. Heart: RRR, distant, no s3, s4, or murmurs. Abdomen: Soft, non-tender, non-distended, BS + x 4.  Extremities: No clubbing, cyanosis or edema. DP/PT/Radials 1+ and equal bilaterally.  Labs       Lab Results  Component Value Date   WBC 12.2 (H) 01/14/2020   HGB 11.6 (L) 01/14/2020   HCT 32.6 (L) 01/14/2020   MCV 85.1 01/14/2020   PLT 215 01/14/2020    Recent Labs  Lab 01/14/20 0813  NA 139  K 3.7  CL 102  CO2 23  BUN 62*  CREATININE 7.60*  CALCIUM 8.8*  PROT 6.6  BILITOT 0.9  ALKPHOS 101  ALT 10  AST 9*  GLUCOSE 121*   Lab Results  Component Value Date   CHOL 180 01/15/2020   HDL 23 (L) 01/15/2020   LDLCALC 111 (H) 01/15/2020   TRIG 232 (H) 01/15/2020     Radiology Studies    CT HEAD WO CONTRAST  Result Date: 01/14/2020 CLINICAL DATA:  Home with numbness on right side to rib cage, neck and shoulder since 6pm last night. Pt did his in home dialysis last pm. EXAM: CT HEAD WITHOUT CONTRAST TECHNIQUE: Contiguous axial images were obtained from the base of the skull through the vertex  without intravenous contrast. COMPARISON:  01/03/2012 FINDINGS: Brain: No evidence of acute infarction, hemorrhage, extra-axial collection, ventriculomegaly, or mass effect. Generalized cerebral atrophy. Periventricular white matter low attenuation likely secondary to microangiopathy. Vascular: Cerebrovascular atherosclerotic calcifications are noted. Skull: Negative for fracture or focal lesion. Sinuses/Orbits: Visualized portions of the orbits are unremarkable. Visualized portions of the paranasal sinuses are unremarkable. Visualized portions of the mastoid air cells are unremarkable. Other: None. IMPRESSION: 1. No acute intracranial pathology. 2. Chronic microvascular disease and cerebral atrophy. Electronically Signed   By: Kathreen Devoid   On: 01/14/2020 09:07   MR BRAIN WO CONTRAST  Result Date: 01/14/2020 CLINICAL DATA:  Right-sided numbness EXAM: MRI HEAD WITHOUT CONTRAST TECHNIQUE: Multiplanar, multiecho pulse sequences of the brain and surrounding structures were obtained without intravenous contrast. COMPARISON:  None. FINDINGS: Brain: There are scattered small foci of reduced diffusion in the left cerebral hemisphere involving expected MCA and PCA territories as well as the posterior right temporal lobe. There is no intracranial mass, mass effect, or edema. There is no hydrocephalus or extra-axial fluid collection. Prominence of the ventricles and sulci reflects minor generalized parenchymal volume loss. Patchy and confluent areas of T2 hyperintensity in the supratentorial white matter nonspecific but probably reflect mild to moderate chronic microvascular ischemic changes. Chronic infarcts of the right basal ganglia. Chronic left occipital infarct with chronic blood products. Small chronic left cerebellar infarct. Scattered small foci of susceptibility  hypointensity in the cerebral white matter most compatible with chronic microhemorrhages. Vascular: Major vessel flow voids at the skull base are  preserved. Skull and upper cervical spine: Normal marrow signal is preserved. Sinuses/Orbits: Paranasal sinuses are aerated. Orbits are unremarkable. Other: Sella is unremarkable.  Mastoid air cells are clear. IMPRESSION: Small acute infarcts involving left cerebral hemisphere (MCA and PCA territories) as well as posterior right temporal lobe. Several chronic infarcts as described. Mild to moderate chronic microvascular ischemic changes. Minor burden of chronic microhemorrhages. Electronically Signed   By: Macy Mis M.D.   On: 01/14/2020 14:47   CT ANGIO HEAD CODE STROKE  Result Date: 01/14/2020 CLINICAL DATA:  Acute neuro deficit. Right-sided numbness. Acute infarct on MRI. EXAM: CT ANGIOGRAPHY HEAD AND NECK TECHNIQUE: Multidetector CT imaging of the head and neck was performed using the standard protocol during bolus administration of intravenous contrast. Multiplanar CT image reconstructions and MIPs were obtained to evaluate the vascular anatomy. Carotid stenosis measurements (when applicable) are obtained utilizing NASCET criteria, using the distal internal carotid diameter as the denominator. CONTRAST:  14mL OMNIPAQUE IOHEXOL 350 MG/ML SOLN COMPARISON:  CT head and MRI head 01/14/2020 FINDINGS: CTA NECK FINDINGS Aortic arch: Extensive atherosclerotic disease in the aortic arch without aneurysm. Atherosclerotic disease in the proximal great vessels without significant stenosis. Short segment dissection of the proximal left subclavian artery without stenosis. Right carotid system: Atherosclerotic disease throughout the right common carotid artery and right carotid bifurcation. No significant stenosis. Left carotid system: Atherosclerotic disease throughout the left common carotid artery and left carotid bifurcation. Calcified and noncalcified plaque in the proximal left internal carotid artery without significant stenosis. Vertebral arteries: Mild stenosis proximal right vertebral artery which is patent  to the basilar. Mild stenosis proximal left vertebral artery. Moderate calcific stenosis distal left vertebral artery at the skull base. Skeleton: Prominent degenerative changes in the facet joints on the left. No acute skeletal abnormality. Other neck: No mass or adenopathy in the neck. Upper chest: Apical emphysema without acute abnormality. Review of the MIP images confirms the above findings CTA HEAD FINDINGS Anterior circulation: Mild atherosclerotic calcification cavernous carotid bilaterally without significant stenosis. Anterior and middle cerebral arteries patent without significant stenosis or large vessel occlusion. Posterior circulation: Moderate stenosis left vertebral artery at the skull base. Both vertebral arteries are patent to the basilar. PICA patent bilaterally. Basilar patent without stenosis. AICA, superior cerebellar, and posterior cerebral arteries are patent. Mild stenosis in the P2 segment bilaterally. Venous sinuses: Normal venous enhancement Anatomic variants: None Review of the MIP images confirms the above findings IMPRESSION: 1. Extensive atherosclerotic disease. Aortic Atherosclerosis (ICD10-I70.0). 2. Atherosclerotic disease the carotid bifurcation bilaterally without significant stenosis. 3. Mild stenosis in the proximal vertebral artery bilaterally. Moderate calcific stenosis distal left vertebral artery. 4. Negative for intracranial large vessel occlusion or flow limiting stenosis. Mild stenosis in the P1 segment bilaterally. 5. 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 6. Preliminary report texted to Dr. Curly Shores Electronically Signed   By: Franchot Gallo M.D.   On: 01/14/2020 16:42   CT ANGIO NECK CODE STROKE  Result Date: 01/14/2020 CLINICAL DATA:  Acute neuro deficit. Right-sided numbness. Acute infarct on MRI. EXAM: CT ANGIOGRAPHY HEAD AND NECK TECHNIQUE: Multidetector CT imaging of the head and neck was performed using the  standard protocol during bolus administration of intravenous contrast. Multiplanar CT image reconstructions and MIPs were obtained to evaluate the vascular anatomy. Carotid stenosis measurements (when applicable) are obtained utilizing NASCET criteria, using the  distal internal carotid diameter as the denominator. CONTRAST:  54mL OMNIPAQUE IOHEXOL 350 MG/ML SOLN COMPARISON:  CT head and MRI head 01/14/2020 FINDINGS: CTA NECK FINDINGS Aortic arch: Extensive atherosclerotic disease in the aortic arch without aneurysm. Atherosclerotic disease in the proximal great vessels without significant stenosis. Short segment dissection of the proximal left subclavian artery without stenosis. Right carotid system: Atherosclerotic disease throughout the right common carotid artery and right carotid bifurcation. No significant stenosis. Left carotid system: Atherosclerotic disease throughout the left common carotid artery and left carotid bifurcation. Calcified and noncalcified plaque in the proximal left internal carotid artery without significant stenosis. Vertebral arteries: Mild stenosis proximal right vertebral artery which is patent to the basilar. Mild stenosis proximal left vertebral artery. Moderate calcific stenosis distal left vertebral artery at the skull base. Skeleton: Prominent degenerative changes in the facet joints on the left. No acute skeletal abnormality. Other neck: No mass or adenopathy in the neck. Upper chest: Apical emphysema without acute abnormality. Review of the MIP images confirms the above findings CTA HEAD FINDINGS Anterior circulation: Mild atherosclerotic calcification cavernous carotid bilaterally without significant stenosis. Anterior and middle cerebral arteries patent without significant stenosis or large vessel occlusion. Posterior circulation: Moderate stenosis left vertebral artery at the skull base. Both vertebral arteries are patent to the basilar. PICA patent bilaterally. Basilar patent  without stenosis. AICA, superior cerebellar, and posterior cerebral arteries are patent. Mild stenosis in the P2 segment bilaterally. Venous sinuses: Normal venous enhancement Anatomic variants: None Review of the MIP images confirms the above findings IMPRESSION: 1. Extensive atherosclerotic disease. Aortic Atherosclerosis (ICD10-I70.0). 2. Atherosclerotic disease the carotid bifurcation bilaterally without significant stenosis. 3. Mild stenosis in the proximal vertebral artery bilaterally. Moderate calcific stenosis distal left vertebral artery. 4. Negative for intracranial large vessel occlusion or flow limiting stenosis. Mild stenosis in the P1 segment bilaterally. 5. 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 6. Preliminary report texted to Dr. Curly Shores Electronically Signed   By: Franchot Gallo M.D.   On: 01/14/2020 16:42    ECG & Cardiac Imaging    Sinus bradycardia, 56 deviation, left anterior fascicular block incomplete right bundle branch block- personally reviewed.  Assessment & Plan    1.  Acute left cerebral hemispheric stroke/aortic and cerebrovascular atherosclerosis: Patient presented early on August 23 after experiencing lack of coordination of his right hand for several hours on the 22nd followed by persistent right neck and flank numbness.  Head CT without acute findings though MRI showed small acute infarcts involving the left cerebral hemisphere as well as right temporal lobe.  CT angiography of the head and neck showed extensive atherosclerotic disease involving the aorta, carotids, and vertebrals.  Short segment dissection of the proximal left subclavian is also noted without stenosis. Per neurology note, severe level 5 aortic arch atheroma with concern for ulcerated plaque and possible thrombus extending into the lumen was also reported.  With the exception of right ear numbness, pt is currently asymptomatic.  Echo has been performed and  showed stable, mild LV dysfunction with an EF of 40-45%, global hypokinesis, LVH, grade 1 diastolic dysfunction, and without any significant valvular abnormalities.  Vascular surgery to see patient to comment on aortic, carotid, and subclavian disease.  I will arrange for a 30-day event monitor, which we mailed to the patient and we will plan to see back in clinic in late September.  Follow-up with neurology as an outpatient as planned.  2.  Essential HTN:  Poorly controlled @ home.  Pt says he always runs high. He is on amlodipine, carvedilol, clonidine patch, and ARB.  Pressure currently 160/72.  Defer any significant antihypertensive medication changes at this time in the setting of stroke.  Of note, he was previously on doxazosin and we can consider adding this back in the outpatient setting.  3.  Cardiomyopathy/abnormal stress test: EF 40-45% dating back to last year with abnormal stress test showing small, mild, fixed basal and mid anterolateral defect as well as a small, mild, mid and apical inferior minimally reversible defect which was felt to be scar with peri-infarct ischemia.  He has been medically managed in the absence of chest pain and renal disease.  He ha chronic, dyspnea exertion with slight progression over the past year.  He is euvolemic on examination.  He remains on beta-blocker and ARB therapy.  Titration of beta-blocker limited by baseline bradycardia.  No MRA in the setting of end-stage renal disease with peritoneal dialysis.  4.  End-stage renal disease: On peritoneal dialysis at home.  5.  Hyperlipidemia: LDL of 111.  He is previously intolerant to atorvastatin secondary to myalgias, agree w/ rosuvastatin.  Cont home dose of Zetia.  6.  Tobacco abuse: Smoking between 1 and 2 packs/day.  Complete cessation advised.  Signed, Rundle Hodgkins, NP 01/15/2020, 11:52 AM  For questions or updates, please contact   Please consult [email or web address] for contact info under  Cardiology/STEMI.

## 2020-01-15 NOTE — Progress Notes (Signed)
RN reported sbp>200 without other associated symptoms or focal neurological deficit. Patient was previously evaluated by in house Neurology for embolic strokes. I discussed with [facility] Neurologist on call Dr. Rory Percy further recommendation for BP management. Per Dr. Rory Percy,  Allow permissive hypertension and treat if sbp>220 x 48 hours. I also paged the on call Cardiology for further recommendations on anticoagulation but did not receive call back. Consult for cardiology placed by the admitting MD. Will continue with current recommendations.    Rufina Falco, BSN, MSN, DNP, [employer]  [facility] Nurse Practitioner  [facility]

## 2020-01-16 DIAGNOSIS — N186 End stage renal disease: Secondary | ICD-10-CM | POA: Diagnosis not present

## 2020-01-16 DIAGNOSIS — D509 Iron deficiency anemia, unspecified: Secondary | ICD-10-CM | POA: Diagnosis not present

## 2020-01-16 DIAGNOSIS — Z992 Dependence on renal dialysis: Secondary | ICD-10-CM | POA: Diagnosis not present

## 2020-01-17 ENCOUNTER — Telehealth: Payer: Self-pay

## 2020-01-17 DIAGNOSIS — D509 Iron deficiency anemia, unspecified: Secondary | ICD-10-CM | POA: Diagnosis not present

## 2020-01-17 DIAGNOSIS — Z992 Dependence on renal dialysis: Secondary | ICD-10-CM | POA: Diagnosis not present

## 2020-01-17 DIAGNOSIS — N186 End stage renal disease: Secondary | ICD-10-CM | POA: Diagnosis not present

## 2020-01-17 NOTE — Telephone Encounter (Signed)
Transition Care Management Follow-up Telephone Call  Date of discharge and from where: 01/15/20 from [facility] ED-Observation  How have you been since you were released from the hospital?  Still slightly numb on R side of face and flank but much improved. No slurred speech. Input/output appropriate and no change in appetite. Staying hydrated. No longer smoking. Denies headache, back pain, blurred vision fever, dizziness, nausea, R arm numbness, confusion.  Any questions or concerns? None   Items Reviewed:  Did the pt receive and understand the discharge instructions provided? Yes , increase activity as tolerated.   Medications obtained and verified? Yes   Any new allergies since your discharge? No   Dietary orders reviewed? Heart healthy   Do you have support at home? Yes   Functional Questionnaire: (I = Independent and D = Dependent) ADLs: i  Bathing/Dressing- i  Meal Prep- i  Eating- i  Maintaining continence- i  Transferring/Ambulation- i  Managing Meds- i  Follow up appointments reviewed:   PCP Hospital f/u appt confirmed? Yes  Scheduled to see Dr. Derrel Nip on 01/21/20 @ 11:30.  [facility] f/u appt confirmed? Yes  Scheduled to see Vascular Surgery 02/14/20 and Cardiology 02/21/20.   Are transportation arrangements needed? No   If their condition worsens, is the pt aware to call PCP or go to the Emergency Dept.? Yes  Was the patient provided with contact information for the PCP's office or ED? Yes  Was to pt encouraged to call back with questions or concerns? Yes

## 2020-01-18 DIAGNOSIS — N186 End stage renal disease: Secondary | ICD-10-CM | POA: Diagnosis not present

## 2020-01-18 DIAGNOSIS — Z992 Dependence on renal dialysis: Secondary | ICD-10-CM | POA: Diagnosis not present

## 2020-01-18 DIAGNOSIS — D509 Iron deficiency anemia, unspecified: Secondary | ICD-10-CM | POA: Diagnosis not present

## 2020-01-19 DIAGNOSIS — N186 End stage renal disease: Secondary | ICD-10-CM | POA: Diagnosis not present

## 2020-01-19 DIAGNOSIS — Z992 Dependence on renal dialysis: Secondary | ICD-10-CM | POA: Diagnosis not present

## 2020-01-19 DIAGNOSIS — D509 Iron deficiency anemia, unspecified: Secondary | ICD-10-CM | POA: Diagnosis not present

## 2020-01-20 DIAGNOSIS — D509 Iron deficiency anemia, unspecified: Secondary | ICD-10-CM | POA: Diagnosis not present

## 2020-01-20 DIAGNOSIS — Z992 Dependence on renal dialysis: Secondary | ICD-10-CM | POA: Diagnosis not present

## 2020-01-20 DIAGNOSIS — N186 End stage renal disease: Secondary | ICD-10-CM | POA: Diagnosis not present

## 2020-01-21 ENCOUNTER — Ambulatory Visit (INDEPENDENT_AMBULATORY_CARE_PROVIDER_SITE_OTHER): Payer: Medicare Other | Admitting: Internal Medicine

## 2020-01-21 ENCOUNTER — Other Ambulatory Visit: Payer: Self-pay

## 2020-01-21 ENCOUNTER — Encounter: Payer: Self-pay | Admitting: Internal Medicine

## 2020-01-21 VITALS — BP 176/82 | HR 69 | Temp 98.4°F | Resp 15 | Ht 66.0 in | Wt 141.6 lb

## 2020-01-21 DIAGNOSIS — I700 Atherosclerosis of aorta: Secondary | ICD-10-CM | POA: Diagnosis not present

## 2020-01-21 DIAGNOSIS — R3911 Hesitancy of micturition: Secondary | ICD-10-CM

## 2020-01-21 DIAGNOSIS — R300 Dysuria: Secondary | ICD-10-CM | POA: Diagnosis not present

## 2020-01-21 DIAGNOSIS — Z09 Encounter for follow-up examination after completed treatment for conditions other than malignant neoplasm: Secondary | ICD-10-CM

## 2020-01-21 DIAGNOSIS — D509 Iron deficiency anemia, unspecified: Secondary | ICD-10-CM | POA: Diagnosis not present

## 2020-01-21 DIAGNOSIS — R29818 Other symptoms and signs involving the nervous system: Secondary | ICD-10-CM | POA: Diagnosis not present

## 2020-01-21 DIAGNOSIS — I10 Essential (primary) hypertension: Secondary | ICD-10-CM | POA: Diagnosis not present

## 2020-01-21 DIAGNOSIS — I69398 Other sequelae of cerebral infarction: Secondary | ICD-10-CM | POA: Diagnosis not present

## 2020-01-21 DIAGNOSIS — I739 Peripheral vascular disease, unspecified: Secondary | ICD-10-CM | POA: Diagnosis not present

## 2020-01-21 DIAGNOSIS — Z992 Dependence on renal dialysis: Secondary | ICD-10-CM | POA: Diagnosis not present

## 2020-01-21 DIAGNOSIS — N186 End stage renal disease: Secondary | ICD-10-CM | POA: Diagnosis not present

## 2020-01-21 DIAGNOSIS — R339 Retention of urine, unspecified: Secondary | ICD-10-CM | POA: Diagnosis not present

## 2020-01-21 DIAGNOSIS — C61 Malignant neoplasm of prostate: Secondary | ICD-10-CM | POA: Diagnosis not present

## 2020-01-21 DIAGNOSIS — E1122 Type 2 diabetes mellitus with diabetic chronic kidney disease: Secondary | ICD-10-CM | POA: Diagnosis not present

## 2020-01-21 DIAGNOSIS — Z23 Encounter for immunization: Secondary | ICD-10-CM | POA: Diagnosis not present

## 2020-01-21 DIAGNOSIS — Z125 Encounter for screening for malignant neoplasm of prostate: Secondary | ICD-10-CM | POA: Diagnosis not present

## 2020-01-21 DIAGNOSIS — N185 Chronic kidney disease, stage 5: Secondary | ICD-10-CM

## 2020-01-21 LAB — PSA, MEDICARE: PSA: 9.98 ng/ml — ABNORMAL HIGH (ref 0.10–4.00)

## 2020-01-21 MED ORDER — DUTASTERIDE-TAMSULOSIN HCL 0.5-0.4 MG PO CAPS: 1.0000 | ORAL_CAPSULE | Freq: Every day | ORAL | 5 refills | Status: DC

## 2020-01-21 NOTE — Patient Instructions (Signed)
  I have called in a medicine for enlarged prostate.  Take it once daily   The tests today are to rule out a bladder infection  I do recommend that you get the [place] or Moderna vaccine

## 2020-01-21 NOTE — Progress Notes (Signed)
Subjective:  Patient ID: [name], male    DOB: 20-Mar-1946  Age: 74 y.o. MRN: [id number]  CC: The primary encounter diagnosis was Urinary hesitancy. Diagnoses of Dysuria, Prostate cancer screening, Accelerated hypertension, Neurologic deficit as late effect of ischemic cerebrovascular accident (CVA), Aortic arch atherosclerosis ([place]), CA of prostate ([place]), Type 2 diabetes mellitus with stage 5 chronic kidney disease not on chronic dialysis, without long-term current use of insulin ([place]), PAD (peripheral artery disease) [facility]), Hospital discharge follow-up, and Urinary retention were also pertinent to this visit.  HPI [name] presents for [place]  This visit occurred during the SARS-CoV-2 public health emergency.  Safety protocols were in place, including screening questions prior to the visit, additional usage of staff PPE, and extensive cleaning of exam room while observing appropriate contact time as indicated for disinfecting solutions.    Patient is a 74 yr old male with ESKD on peritoneal dialysis , malignant hypertension,  PAD,  Tobacco abuse, Admitted to [facility] on August 23 with diagnosis of CVA involving left brain.  Symptoms began one day PTA  at 6:30 pm while getting dressed right arm felt numb and finger wouldn't work (couldn't tie shoes) . Wife suggested they go to ER  But he deferred and they went on to dinner .  A little later the same night ,  The right side of face went numb.  The next morning symptoms of facial numbness and right torso numbness were still present so he decided to go to  ER . He was admitted briefly for workup which included  CT head,  Followed by MRI brain,  ECHO , then CT angio of Head and neck .  Bedside swallow evaluation  (rapid swallow) noted a slight cough so he was not fed during the 30 hours of hospitalization.  All imaging studies, labs, and consult notes were reviewed with patient and wife. He was discharged home on August 24 with  diagnosis of left brain CVA involving MCA and PCA, and right temporal lobe CVA as well.  Also diagnosed with aortic atherosclerosis , bilateral carotid artery atherosclerosis without significant stenosis,  And short segment proximal left subclavian artery dissection without significant stenosis.  His symptoms resolved except for residual numbness of the scalp behind his right ear .   Seen by Vascular Surgery in house   Medications reviewed: Clonidine patches (0.6 mg total dose)  Were started, along with  Dual platelet therapy of  asa and plavix for 21 days. Reviewed current .HTN regimen:  Currently one one  Clonidine patch (0.3 mg) ,  Amlodipine and carvedilol , with plans to add 2nd patch tomorrow   Appetite has improved and feels he is breathing better .  Wife thinks he is looking better than he did 6 weeks ago. He  notes that whenever his BP is below 527 systolic , he feels poorly.  He is orthostatic with a drop in systolic of > 25 pts.    Peritoneal dialysis started last November.  Sees [facility] monthly for labs .  Since discharge  He has been having urinary hesitancy , mild burning .  He has a history of renal cell CA,  Prostate CA   Accelerated hypertension:   On 7  Meds,  Still high.  Improved with clonidine patch .  Notes symptomatic orthostasis with drop from 170 to 140.  He quit smoking on August 24  Outpatient Medications Prior to Visit  Medication Sig Dispense Refill  . albuterol (  VENTOLIN HFA) 108 (90 Base) MCG/ACT inhaler Inhale 2 puffs into the lungs every 6 (six) hours as needed for wheezing or shortness of breath. 18 g 0  . amLODipine (NORVASC) 10 MG tablet TAKE 1 TABLET BY MOUTH ONCE DAILY 90 tablet 1  . aspirin 81 MG tablet Take 1 tablet (81 mg total) by mouth daily. 30 tablet 11  . calcitRIOL (ROCALTROL) 0.25 MCG capsule Take 0.25 mcg by mouth daily. Take one capsule only on Monday / Wednesday / FRIDAY  3  . carvedilol (COREG) 6.25 MG tablet Take 6.25 mg by mouth 2 (two)  times daily with a meal.   6  . cloNIDine (CATAPRES - DOSED IN MG/24 HR) 0.3 mg/24hr patch Place 2 patches (0.6 mg total) onto the skin once a week. 4 patch 0  . clopidogrel (PLAVIX) 75 MG tablet Take 1 tablet (75 mg total) by mouth daily. 21 tablet 0  . diphenhydrAMINE (BENADRYL) 25 MG tablet Take 25 mg by mouth every 6 (six) hours as needed.    . fluticasone (FLONASE) 50 MCG/ACT nasal spray Place 2 sprays into both nostrils daily. 16 g 6  . gabapentin (NEURONTIN) 100 MG capsule TAKE 1 CAPSULE BY MOUTH 3 TIMES DAILY 90 capsule 1  . gentamicin cream (GARAMYCIN) 0.1 % Apply 1 application topically daily. 15 g 0  . losartan (COZAAR) 100 MG tablet Take 100 mg by mouth daily.    . multivitamin (RENA-VIT) TABS tablet Take 1 tablet by mouth at bedtime.    . nicotine (NICODERM CQ - DOSED IN MG/24 HOURS) 14 mg/24hr patch 14 mg patch daily to chest wall, okay to substitute generic 28 patch 0  . rosuvastatin (CRESTOR) 20 MG tablet Take 1 tablet (20 mg total) by mouth at bedtime. 30 tablet 0  . sodium bicarbonate 325 MG tablet TK 1 T PO BID  1  . ezetimibe (ZETIA) 10 MG tablet Take 1 tablet (10 mg total) by mouth daily. 90 tablet 3   Facility-Administered Medications Prior to Visit  Medication Dose Route Frequency Provider Last Rate Last Admin  . ipratropium-albuterol (DUONEB) 0.5-2.5 (3) MG/3ML nebulizer solution 3 mL  3 mL Nebulization Q6H Crecencio Mc, MD        Review of Systems;  Patient denies headache, fevers, malaise, unintentional weight loss, skin rash, eye pain, sinus congestion and sinus pain, sore throat, dysphagia,  hemoptysis , cough, dyspnea, wheezing, chest pain, palpitations, orthopnea, edema, abdominal pain, nausea, melena, diarrhea, constipation, flank pain, dysuria, hematuria, urinary  Frequency, nocturia, numbness, tingling, seizures,  Focal weakness, Loss of consciousness,  Tremor, insomnia, depression, anxiety, and suicidal ideation.      Objective:  BP (!) 176/82 (BP  Location: Left Arm, Patient Position: Sitting, Cuff Size: Normal)   Pulse 69   Temp 98.4 F (36.9 C) (Oral)   Resp 15   Ht 5\' 6"  (1.676 m)   Wt 141 lb 9.6 oz (64.2 kg)   SpO2 99%   BMI 22.85 kg/m   BP Readings from Last 3 Encounters:  01/21/20 (!) 176/82  01/15/20 (!) 150/65  05/11/19 (!) 162/70    Wt Readings from Last 3 Encounters:  01/21/20 141 lb 9.6 oz (64.2 kg)  01/14/20 140 lb (63.5 kg)  05/11/19 138 lb 8 oz (62.8 kg)    General appearance: alert, cooperative and appears older than stated age Ears: normal TM's and external ear canals both ears Throat: lips, mucosa, and tongue normal; teeth and gums normal Neck: no adenopathy, no carotid bruit, supple,  symmetrical, trachea midline and thyroid not enlarged, symmetric, no tenderness/mass/nodules Back: symmetric, no curvature. ROM normal. No CVA tenderness. Lungs: clear to auscultation bilaterally Heart: regular rate and rhythm, S1, S2 normal, no murmur, click, rub or gallop Abdomen: soft, non-tender; bowel sounds normal; no masses,  no organomegaly Pulses: 2+ and symmetric Skin: Skin color, texture, turgor normal. No rashes or lesions Lymph nodes: Cervical, supraclavicular, and axillary nodes normal.  Lab Results  Component Value Date   HGBA1C 6.2 (H) 01/14/2020   HGBA1C 5.7 (A) 11/30/2018   HGBA1C 5.7 11/30/2018    Lab Results  Component Value Date   CREATININE 7.60 (H) 01/14/2020   CREATININE 7.15 (H) 02/26/2019   CREATININE 4.96 (HH) 05/29/2018    Lab Results  Component Value Date   WBC 12.2 (H) 01/14/2020   HGB 11.6 (L) 01/14/2020   HCT 32.6 (L) 01/14/2020   PLT 215 01/14/2020   GLUCOSE 121 (H) 01/14/2020   CHOL 180 01/15/2020   TRIG 232 (H) 01/15/2020   HDL 23 (L) 01/15/2020   LDLDIRECT 134.0 05/11/2017   LDLCALC 111 (H) 01/15/2020   ALT 10 01/14/2020   AST 9 (L) 01/14/2020   NA 139 01/14/2020   K 3.7 01/14/2020   CL 102 01/14/2020   CREATININE 7.60 (H) 01/14/2020   BUN 62 (H) 01/14/2020    CO2 23 01/14/2020   TSH 3.479 01/14/2020   PSA 9.98 (H) 01/21/2020   INR 1.0 01/14/2020   HGBA1C 6.2 (H) 01/14/2020   MICROALBUR 78.6 (H) 05/11/2017    CT HEAD WO CONTRAST  Result Date: 01/14/2020 CLINICAL DATA:  Home with numbness on right side to rib cage, neck and shoulder since 6pm last night. Pt did his in home dialysis last pm. EXAM: CT HEAD WITHOUT CONTRAST TECHNIQUE: Contiguous axial images were obtained from the base of the skull through the vertex without intravenous contrast. COMPARISON:  01/03/2012 FINDINGS: Brain: No evidence of acute infarction, hemorrhage, extra-axial collection, ventriculomegaly, or mass effect. Generalized cerebral atrophy. Periventricular white matter low attenuation likely secondary to microangiopathy. Vascular: Cerebrovascular atherosclerotic calcifications are noted. Skull: Negative for fracture or focal lesion. Sinuses/Orbits: Visualized portions of the orbits are unremarkable. Visualized portions of the paranasal sinuses are unremarkable. Visualized portions of the mastoid air cells are unremarkable. Other: None. IMPRESSION: 1. No acute intracranial pathology. 2. Chronic microvascular disease and cerebral atrophy. Electronically Signed   By: Kathreen Devoid   On: 01/14/2020 09:07   MR BRAIN WO CONTRAST  Result Date: 01/14/2020 CLINICAL DATA:  Right-sided numbness EXAM: MRI HEAD WITHOUT CONTRAST TECHNIQUE: Multiplanar, multiecho pulse sequences of the brain and surrounding structures were obtained without intravenous contrast. COMPARISON:  None. FINDINGS: Brain: There are scattered small foci of reduced diffusion in the left cerebral hemisphere involving expected MCA and PCA territories as well as the posterior right temporal lobe. There is no intracranial mass, mass effect, or edema. There is no hydrocephalus or extra-axial fluid collection. Prominence of the ventricles and sulci reflects minor generalized parenchymal volume loss. Patchy and confluent areas of T2  hyperintensity in the supratentorial white matter nonspecific but probably reflect mild to moderate chronic microvascular ischemic changes. Chronic infarcts of the right basal ganglia. Chronic left occipital infarct with chronic blood products. Small chronic left cerebellar infarct. Scattered small foci of susceptibility hypointensity in the cerebral white matter most compatible with chronic microhemorrhages. Vascular: Major vessel flow voids at the skull base are preserved. Skull and upper cervical spine: Normal marrow signal is preserved. Sinuses/Orbits: Paranasal sinuses are aerated. Orbits  are unremarkable. Other: Sella is unremarkable.  Mastoid air cells are clear. IMPRESSION: Small acute infarcts involving left cerebral hemisphere (MCA and PCA territories) as well as posterior right temporal lobe. Several chronic infarcts as described. Mild to moderate chronic microvascular ischemic changes. Minor burden of chronic microhemorrhages. Electronically Signed   By: Macy Mis M.D.   On: 01/14/2020 14:47   ECHOCARDIOGRAM COMPLETE BUBBLE STUDY  Result Date: 01/15/2020    ECHOCARDIOGRAM REPORT   Patient Name:   [name] Date of Exam: 01/14/2020 Medical Rec #:  [id number]       Height:       66.0 in Accession #:    [id number]      Weight:       140.0 lb Date of Birth:  07-23-1945       BSA:          1.719 m Patient Age:    68 years        BP:           191/83 mmHg Patient Gender: M               HR:           55 bpm. Exam Location:  [facility] Procedure: 2D Echo, Cardiac Doppler and Color Doppler Indications:     Stroke 434.91 / I63.9  History:         Patient has prior history of Echocardiogram examinations.                  Cardiomyopathy, TIA; Risk Factors:Hypertension.  Sonographer:     Alyse Low Roar Referring Phys:  [id number] Lorenza Chick Diagnosing Phys: [name] MD IMPRESSIONS  1. Left ventricular ejection fraction, by estimation, is 40 to 45%. The left ventricle has mildly decreased function. The  left ventricle demonstrates global hypokinesis. There is moderate left ventricular hypertrophy. Left ventricular diastolic parameters are consistent with Grade I diastolic dysfunction (impaired relaxation). Elevated left atrial pressure.  2. Right ventricular systolic function is normal. The right ventricular size is normal. Mildly increased right ventricular wall thickness.  3. Left atrial size was severely dilated.  4. The mitral valve is degenerative. Mild mitral valve regurgitation. No evidence of mitral stenosis.  5. Unable to determine valve morphology due to image quality. Aortic valve regurgitation is mild. Mild to moderate aortic valve sclerosis/calcification is present, without any evidence of aortic stenosis.  6. Aortic dilatation noted. There is mild dilatation of the ascending aorta measuring 37 mm.  7. The inferior vena cava is normal in size with greater than 50% respiratory variability, suggesting right atrial pressure of 3 mmHg.  8. Agitated saline contrast bubble study was negative, with no evidence of any interatrial shunt. FINDINGS  Left Ventricle: Left ventricular ejection fraction, by estimation, is 40 to 45%. The left ventricle has mildly decreased function. The left ventricle demonstrates global hypokinesis. The left ventricular internal cavity size was normal in size. There is  moderate left ventricular hypertrophy. Left ventricular diastolic parameters are consistent with Grade I diastolic dysfunction (impaired relaxation). Elevated left atrial pressure. Right Ventricle: The right ventricular size is normal. Mildly increased right ventricular wall thickness. Right ventricular systolic function is normal. Left Atrium: Left atrial size was severely dilated. Right Atrium: Right atrial size was normal in size. Pericardium: Trivial pericardial effusion is present. Mitral Valve: The mitral valve is degenerative in appearance. Mild mitral annular calcification. Mild mitral valve regurgitation. No  evidence of mitral valve stenosis. Tricuspid Valve: The tricuspid  valve is not well visualized. Tricuspid valve regurgitation is not demonstrated. Aortic Valve: Unable to determine valve morphology due to image quality.. There is mild thickening and mild calcification of the aortic valve. Aortic valve regurgitation is mild. Mild to moderate aortic valve sclerosis/calcification is present, without any evidence of aortic stenosis. There is mild thickening of the aortic valve. There is mild calcification of the aortic valve. Aortic valve peak gradient measures 6.0 mmHg. Pulmonic Valve: The pulmonic valve was not well visualized. Pulmonic valve regurgitation is mild. No evidence of pulmonic stenosis. Aorta: Aortic dilatation noted. There is mild dilatation of the ascending aorta measuring 37 mm. Pulmonary Artery: The pulmonary artery is not well seen. Venous: The inferior vena cava is normal in size with greater than 50% respiratory variability, suggesting right atrial pressure of 3 mmHg. IAS/Shunts: The interatrial septum was not well visualized. Agitated saline contrast bubble study was negative, with no evidence of any interatrial shunt.  LEFT VENTRICLE PLAX 2D LVIDd:         5.48 cm      Diastology LVIDs:         4.38 cm      LV e' lateral:   3.05 cm/s LV PW:         1.32 cm      LV E/e' lateral: 18.3 LV IVS:        1.38 cm      LV e' medial:    3.48 cm/s LVOT diam:     2.20 cm      LV E/e' medial:  16.0 LVOT Area:     3.80 cm  LV Volumes (MOD) LV vol d, MOD A4C: 129.0 ml LV vol s, MOD A4C: 78.9 ml LV SV MOD A4C:     129.0 ml RIGHT VENTRICLE RV Mid diam:    3.16 cm RV S prime:     14.40 cm/s TAPSE (M-mode): 2.6 cm LEFT ATRIUM              Index LA diam:        4.70 cm  2.73 cm/m LA Vol (A2C):   86.7 ml  50.45 ml/m LA Vol (A4C):   111.0 ml 64.59 ml/m LA Biplane Vol: 105.0 ml 61.10 ml/m  AORTIC VALVE                PULMONIC VALVE AV Area (Vmax): 2.46 cm    PV Vmax:       0.71 m/s AV Vmax:        122.00 cm/s PV  Peak grad:  2.0 mmHg AV Peak Grad:   6.0 mmHg LVOT Vmax:      79.00 cm/s  AORTA Ao Root diam: 3.70 cm MITRAL VALVE MV Area (PHT): 6.12 cm     SHUNTS MV Decel Time: 124 msec     Systemic Diam: 2.20 cm MV E velocity: 55.70 cm/s MV A velocity: 119.00 cm/s MV E/A ratio:  0.47 MV A Prime:    9.2 cm/s [name] MD Electronically signed by [name] MD Signature Date/Time: 01/15/2020/1:23:28 PM    Final    CT ANGIO HEAD CODE STROKE  Result Date: 01/14/2020 CLINICAL DATA:  Acute neuro deficit. Right-sided numbness. Acute infarct on MRI. EXAM: CT ANGIOGRAPHY HEAD AND NECK TECHNIQUE: Multidetector CT imaging of the head and neck was performed using the standard protocol during bolus administration of intravenous contrast. Multiplanar CT image reconstructions and MIPs were obtained to evaluate the vascular anatomy. Carotid stenosis measurements (when applicable) are obtained  utilizing NASCET criteria, using the distal internal carotid diameter as the denominator. CONTRAST:  61mL OMNIPAQUE IOHEXOL 350 MG/ML SOLN COMPARISON:  CT head and MRI head 01/14/2020 FINDINGS: CTA NECK FINDINGS Aortic arch: Extensive atherosclerotic disease in the aortic arch without aneurysm. Atherosclerotic disease in the proximal great vessels without significant stenosis. Short segment dissection of the proximal left subclavian artery without stenosis. Right carotid system: Atherosclerotic disease throughout the right common carotid artery and right carotid bifurcation. No significant stenosis. Left carotid system: Atherosclerotic disease throughout the left common carotid artery and left carotid bifurcation. Calcified and noncalcified plaque in the proximal left internal carotid artery without significant stenosis. Vertebral arteries: Mild stenosis proximal right vertebral artery which is patent to the basilar. Mild stenosis proximal left vertebral artery. Moderate calcific stenosis distal left vertebral artery at the skull base.  Skeleton: Prominent degenerative changes in the facet joints on the left. No acute skeletal abnormality. Other neck: No mass or adenopathy in the neck. Upper chest: Apical emphysema without acute abnormality. Review of the MIP images confirms the above findings CTA HEAD FINDINGS Anterior circulation: Mild atherosclerotic calcification cavernous carotid bilaterally without significant stenosis. Anterior and middle cerebral arteries patent without significant stenosis or large vessel occlusion. Posterior circulation: Moderate stenosis left vertebral artery at the skull base. Both vertebral arteries are patent to the basilar. PICA patent bilaterally. Basilar patent without stenosis. AICA, superior cerebellar, and posterior cerebral arteries are patent. Mild stenosis in the P2 segment bilaterally. Venous sinuses: Normal venous enhancement Anatomic variants: None Review of the MIP images confirms the above findings IMPRESSION: 1. Extensive atherosclerotic disease. Aortic Atherosclerosis (ICD10-I70.0). 2. Atherosclerotic disease the carotid bifurcation bilaterally without significant stenosis. 3. Mild stenosis in the proximal vertebral artery bilaterally. Moderate calcific stenosis distal left vertebral artery. 4. Negative for intracranial large vessel occlusion or flow limiting stenosis. Mild stenosis in the P1 segment bilaterally. 5. 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 6. Preliminary report texted to Dr. Curly Shores Electronically Signed   By: Franchot Gallo M.D.   On: 01/14/2020 16:42   CT ANGIO NECK CODE STROKE  Result Date: 01/14/2020 CLINICAL DATA:  Acute neuro deficit. Right-sided numbness. Acute infarct on MRI. EXAM: CT ANGIOGRAPHY HEAD AND NECK TECHNIQUE: Multidetector CT imaging of the head and neck was performed using the standard protocol during bolus administration of intravenous contrast. Multiplanar CT image reconstructions and MIPs were obtained to evaluate  the vascular anatomy. Carotid stenosis measurements (when applicable) are obtained utilizing NASCET criteria, using the distal internal carotid diameter as the denominator. CONTRAST:  49mL OMNIPAQUE IOHEXOL 350 MG/ML SOLN COMPARISON:  CT head and MRI head 01/14/2020 FINDINGS: CTA NECK FINDINGS Aortic arch: Extensive atherosclerotic disease in the aortic arch without aneurysm. Atherosclerotic disease in the proximal great vessels without significant stenosis. Short segment dissection of the proximal left subclavian artery without stenosis. Right carotid system: Atherosclerotic disease throughout the right common carotid artery and right carotid bifurcation. No significant stenosis. Left carotid system: Atherosclerotic disease throughout the left common carotid artery and left carotid bifurcation. Calcified and noncalcified plaque in the proximal left internal carotid artery without significant stenosis. Vertebral arteries: Mild stenosis proximal right vertebral artery which is patent to the basilar. Mild stenosis proximal left vertebral artery. Moderate calcific stenosis distal left vertebral artery at the skull base. Skeleton: Prominent degenerative changes in the facet joints on the left. No acute skeletal abnormality. Other neck: No mass or adenopathy in the neck. Upper chest: Apical emphysema without acute abnormality. Review of the  MIP images confirms the above findings CTA HEAD FINDINGS Anterior circulation: Mild atherosclerotic calcification cavernous carotid bilaterally without significant stenosis. Anterior and middle cerebral arteries patent without significant stenosis or large vessel occlusion. Posterior circulation: Moderate stenosis left vertebral artery at the skull base. Both vertebral arteries are patent to the basilar. PICA patent bilaterally. Basilar patent without stenosis. AICA, superior cerebellar, and posterior cerebral arteries are patent. Mild stenosis in the P2 segment bilaterally. Venous  sinuses: Normal venous enhancement Anatomic variants: None Review of the MIP images confirms the above findings IMPRESSION: 1. Extensive atherosclerotic disease. Aortic Atherosclerosis (ICD10-I70.0). 2. Atherosclerotic disease the carotid bifurcation bilaterally without significant stenosis. 3. Mild stenosis in the proximal vertebral artery bilaterally. Moderate calcific stenosis distal left vertebral artery. 4. Negative for intracranial large vessel occlusion or flow limiting stenosis. Mild stenosis in the P1 segment bilaterally. 5. 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 6. Preliminary report texted to Dr. Curly Shores Electronically Signed   By: Franchot Gallo M.D.   On: 01/14/2020 16:42    Assessment & Plan:   Problem List Items Addressed This Visit      Unprioritized   Accelerated hypertension    Difficult to manage for years due to resulting malaise and orthostasis with attempts to lower systolic to <416.  I suspect his brain is underperfused due to significant atherosclerosis . Agree with trial of clonidine 0.6 mg  . Continue metoprolol and amlodipine and follow up with dr Rolly Salter for next adjustment (needs to add back losartan 100 mg )       Aortic arch atherosclerosis ([place])    Noted on CT during recent admission.  He is willing to take Crestor.       CA of prostate [facility])    Diagnosed in Sept 2015,  Biopsy deferred by patient due to prior syncopal events .  PSA remains elevated and he is reporting urinary retention since discharge.  REferral to Urology advised.       DM (diabetes mellitus), type 2 with renal complications ([place])    Controlled without medications secondary to weight loss  Lab Results  Component Value Date   HGBA1C 6.2 (H) 01/14/2020         Hospital discharge follow-up    Patient is stable post discharge; all issues and questions about discharge plans were addressed at the visit today for hospital follow up. All labs ,  imaging studies and progress notes from admission were reviewed with patient today        Neurologic deficit as late effect of ischemic cerebrovascular accident (CVA)    Numbness of right side of scalp,  Residual deficit from left brain CVA in the MCA and PCA territories,  Also had right temporal lobe CVA. ECHO was non revealing. Continue asa and plavix for a total of 21 days. Attempts to normalize BP may result in more deficits.       PAD (peripheral artery disease) ([facility])    Now s/p acute CVA with bilateral infarcts noted during recent admission.  Continue asa, plavix and Crestor.        Urinary retention    With mild dysuria.  Likely due to S/e of clonidine aggravating chronic conditions of BPH.  Adding dual agent today (tamsulosin not covered ), ruling out UTI        Other Visit Diagnoses    Urinary hesitancy    -  Primary   Relevant Medications   Dutasteride-Tamsulosin HCl 0.5-0.4 MG CAPS   Other Relevant  Orders   Urinalysis, Routine w reflex microscopic (Completed)   Urine Culture   Dysuria       Relevant Orders   Urinalysis, Routine w reflex microscopic (Completed)   Urine Culture   Prostate cancer screening       Relevant Orders   PSA, Medicare (Completed)      I am having [name] "[name]" start on Dutasteride-Tamsulosin HCl. I am also having him maintain his aspirin, diphenhydrAMINE, carvedilol, sodium bicarbonate, calcitRIOL, fluticasone, losartan, albuterol, ezetimibe, amLODipine, gabapentin, multivitamin, cloNIDine, clopidogrel, nicotine, gentamicin cream, and rosuvastatin. We will continue to administer ipratropium-albuterol.  Meds ordered this encounter  Medications  . Dutasteride-Tamsulosin HCl 0.5-0.4 MG CAPS    Sig: Take 1 capsule by mouth daily.    Dispense:  30 capsule    Refill:  5    There are no discontinued medications.  Follow-up: No follow-ups on file.   Crecencio Mc, MD

## 2020-01-22 DIAGNOSIS — R339 Retention of urine, unspecified: Secondary | ICD-10-CM | POA: Insufficient documentation

## 2020-01-22 DIAGNOSIS — R972 Elevated prostate specific antigen [PSA]: Secondary | ICD-10-CM

## 2020-01-22 DIAGNOSIS — C61 Malignant neoplasm of prostate: Secondary | ICD-10-CM

## 2020-01-22 DIAGNOSIS — Z09 Encounter for follow-up examination after completed treatment for conditions other than malignant neoplasm: Secondary | ICD-10-CM | POA: Insufficient documentation

## 2020-01-22 DIAGNOSIS — N186 End stage renal disease: Secondary | ICD-10-CM | POA: Diagnosis not present

## 2020-01-22 DIAGNOSIS — I700 Atherosclerosis of aorta: Secondary | ICD-10-CM | POA: Insufficient documentation

## 2020-01-22 DIAGNOSIS — D509 Iron deficiency anemia, unspecified: Secondary | ICD-10-CM | POA: Diagnosis not present

## 2020-01-22 DIAGNOSIS — Z992 Dependence on renal dialysis: Secondary | ICD-10-CM | POA: Diagnosis not present

## 2020-01-22 LAB — URINALYSIS, ROUTINE W REFLEX MICROSCOPIC
Bilirubin Urine: NEGATIVE
Ketones, ur: NEGATIVE
Leukocytes,Ua: NEGATIVE
Nitrite: NEGATIVE
Specific Gravity, Urine: 1.01 (ref 1.000–1.030)
Total Protein, Urine: 30 — AB
Urine Glucose: 100 — AB
Urobilinogen, UA: 0.2 (ref 0.0–1.0)
pH: 7 (ref 5.0–8.0)

## 2020-01-22 LAB — URINE CULTURE
MICRO NUMBER:: 10888357
Result:: NO GROWTH
SPECIMEN QUALITY:: ADEQUATE

## 2020-01-22 NOTE — Assessment & Plan Note (Addendum)
Difficult to manage for years due to resulting malaise and orthostasis with attempts to lower systolic to <834.  I suspect his brain is underperfused due to significant atherosclerosis . Agree with trial of clonidine 0.6 mg  . Continue metoprolol and amlodipine and follow up with dr Rolly Salter for next adjustment (needs to add back losartan 100 mg )

## 2020-01-22 NOTE — Assessment & Plan Note (Signed)
Diagnosed in Sept 2015,  Biopsy deferred by patient due to prior syncopal events .  PSA remains elevated and he is reporting urinary retention since discharge.  REferral to Urology advised.

## 2020-01-22 NOTE — Assessment & Plan Note (Signed)
Controlled without medications secondary to weight loss  Lab Results  Component Value Date   HGBA1C 6.2 (H) 01/14/2020

## 2020-01-22 NOTE — Assessment & Plan Note (Signed)
With mild dysuria.  Likely due to S/e of clonidine aggravating chronic conditions of BPH.  Adding dual agent today (tamsulosin not covered ), ruling out UTI

## 2020-01-22 NOTE — Assessment & Plan Note (Addendum)
Now s/p acute CVA with bilateral infarcts noted during recent admission.  Continue asa, plavix and Crestor.

## 2020-01-22 NOTE — Assessment & Plan Note (Signed)
Noted on CT during recent admission.  He is willing to take Crestor.

## 2020-01-22 NOTE — Assessment & Plan Note (Signed)
Patient is stable post discharge; all issues and questions about discharge plans were addressed at the visit today for hospital follow up. All labs , imaging studies and progress notes from admission were reviewed with patient today

## 2020-01-22 NOTE — Assessment & Plan Note (Signed)
Numbness of right side of scalp,  Residual deficit from left brain CVA in the MCA and PCA territories,  Also had right temporal lobe CVA. ECHO was non revealing. Continue asa and plavix for a total of 21 days. Attempts to normalize BP may result in more deficits.

## 2020-01-23 DIAGNOSIS — Z992 Dependence on renal dialysis: Secondary | ICD-10-CM | POA: Diagnosis not present

## 2020-01-23 DIAGNOSIS — N186 End stage renal disease: Secondary | ICD-10-CM | POA: Diagnosis not present

## 2020-01-23 DIAGNOSIS — D509 Iron deficiency anemia, unspecified: Secondary | ICD-10-CM | POA: Diagnosis not present

## 2020-01-24 DIAGNOSIS — N186 End stage renal disease: Secondary | ICD-10-CM | POA: Diagnosis not present

## 2020-01-24 DIAGNOSIS — Z992 Dependence on renal dialysis: Secondary | ICD-10-CM | POA: Diagnosis not present

## 2020-01-24 DIAGNOSIS — D509 Iron deficiency anemia, unspecified: Secondary | ICD-10-CM | POA: Diagnosis not present

## 2020-01-25 DIAGNOSIS — Z992 Dependence on renal dialysis: Secondary | ICD-10-CM | POA: Diagnosis not present

## 2020-01-25 DIAGNOSIS — N186 End stage renal disease: Secondary | ICD-10-CM | POA: Diagnosis not present

## 2020-01-25 DIAGNOSIS — D509 Iron deficiency anemia, unspecified: Secondary | ICD-10-CM | POA: Diagnosis not present

## 2020-01-26 DIAGNOSIS — N186 End stage renal disease: Secondary | ICD-10-CM | POA: Diagnosis not present

## 2020-01-26 DIAGNOSIS — Z992 Dependence on renal dialysis: Secondary | ICD-10-CM | POA: Diagnosis not present

## 2020-01-26 DIAGNOSIS — D509 Iron deficiency anemia, unspecified: Secondary | ICD-10-CM | POA: Diagnosis not present

## 2020-01-27 DIAGNOSIS — N186 End stage renal disease: Secondary | ICD-10-CM | POA: Diagnosis not present

## 2020-01-27 DIAGNOSIS — Z992 Dependence on renal dialysis: Secondary | ICD-10-CM | POA: Diagnosis not present

## 2020-01-27 DIAGNOSIS — D509 Iron deficiency anemia, unspecified: Secondary | ICD-10-CM | POA: Diagnosis not present

## 2020-01-28 DIAGNOSIS — N186 End stage renal disease: Secondary | ICD-10-CM | POA: Diagnosis not present

## 2020-01-28 DIAGNOSIS — D509 Iron deficiency anemia, unspecified: Secondary | ICD-10-CM | POA: Diagnosis not present

## 2020-01-28 DIAGNOSIS — Z992 Dependence on renal dialysis: Secondary | ICD-10-CM | POA: Diagnosis not present

## 2020-01-29 DIAGNOSIS — Z992 Dependence on renal dialysis: Secondary | ICD-10-CM | POA: Diagnosis not present

## 2020-01-29 DIAGNOSIS — D509 Iron deficiency anemia, unspecified: Secondary | ICD-10-CM | POA: Diagnosis not present

## 2020-01-29 DIAGNOSIS — N186 End stage renal disease: Secondary | ICD-10-CM | POA: Diagnosis not present

## 2020-01-30 DIAGNOSIS — Z992 Dependence on renal dialysis: Secondary | ICD-10-CM | POA: Diagnosis not present

## 2020-01-30 DIAGNOSIS — N186 End stage renal disease: Secondary | ICD-10-CM | POA: Diagnosis not present

## 2020-01-30 DIAGNOSIS — D509 Iron deficiency anemia, unspecified: Secondary | ICD-10-CM | POA: Diagnosis not present

## 2020-01-31 ENCOUNTER — Other Ambulatory Visit: Payer: Self-pay | Admitting: Family Medicine

## 2020-01-31 ENCOUNTER — Other Ambulatory Visit: Payer: Self-pay

## 2020-01-31 ENCOUNTER — Ambulatory Visit (INDEPENDENT_AMBULATORY_CARE_PROVIDER_SITE_OTHER): Payer: Medicare Other | Admitting: Urology

## 2020-01-31 ENCOUNTER — Encounter: Payer: Self-pay | Admitting: Urology

## 2020-01-31 VITALS — BP 146/78 | HR 78 | Ht 67.0 in | Wt 141.0 lb

## 2020-01-31 DIAGNOSIS — D509 Iron deficiency anemia, unspecified: Secondary | ICD-10-CM | POA: Diagnosis not present

## 2020-01-31 DIAGNOSIS — Z992 Dependence on renal dialysis: Secondary | ICD-10-CM | POA: Diagnosis not present

## 2020-01-31 DIAGNOSIS — C61 Malignant neoplasm of prostate: Secondary | ICD-10-CM | POA: Diagnosis not present

## 2020-01-31 DIAGNOSIS — N186 End stage renal disease: Secondary | ICD-10-CM | POA: Diagnosis not present

## 2020-01-31 DIAGNOSIS — Z85528 Personal history of other malignant neoplasm of kidney: Secondary | ICD-10-CM

## 2020-02-01 DIAGNOSIS — D509 Iron deficiency anemia, unspecified: Secondary | ICD-10-CM | POA: Diagnosis not present

## 2020-02-01 DIAGNOSIS — Z992 Dependence on renal dialysis: Secondary | ICD-10-CM | POA: Diagnosis not present

## 2020-02-01 DIAGNOSIS — N186 End stage renal disease: Secondary | ICD-10-CM | POA: Diagnosis not present

## 2020-02-02 ENCOUNTER — Encounter: Payer: Self-pay | Admitting: Urology

## 2020-02-02 DIAGNOSIS — D509 Iron deficiency anemia, unspecified: Secondary | ICD-10-CM | POA: Diagnosis not present

## 2020-02-02 DIAGNOSIS — Z992 Dependence on renal dialysis: Secondary | ICD-10-CM | POA: Diagnosis not present

## 2020-02-02 DIAGNOSIS — N186 End stage renal disease: Secondary | ICD-10-CM | POA: Diagnosis not present

## 2020-02-02 NOTE — Progress Notes (Signed)
01/31/2020 11:11 AM   [name] 1945-12-15 [id number]  Referring provider: Crecencio Mc, MD [place],  [place] [address]  Chief Complaint  Patient presents with  . Other    HPI: [name] is a 74 y.o. male seen at the request of Dr. Derrel Nip for evaluation of an elevated PSA with history prostate cancer.   Prostate biopsy 2010 by Dr. [name] with 1 core showing 20% Gleason 3+3 adenocarcinoma  He elected surveillance however was lost to follow-up until I saw him at [facility] in 2015 with a PSA of 4.34  Confirmatory biopsy performed 02/26/2014; PSA 4.7; prostate volume 41 cc and 12 core biopsy showed no cancer, ASAP or HGPIN  Continued active surveillance was elected and I last saw him June 2018 and PSA 5.82  No urologic follow-up since 2018; PSA 01/21/2020 9.98  Was having bothersome lower urinary tract symptoms including frequency, urgency, hesitancy and weak urinary stream and started tamsulosin 01/21/2020 with significant improvement in his voiding symptoms  Denies dysuria, gross hematuria  Denies flank, abdominal or pelvic pain   History renal cell carcinoma status post partial left nephrectomy Dr. [name] 2004 and cryoablation of a right renal lesion 2006  On peritoneal dialysis for CKD  No recent upper tract imaging      PMH: Past Medical History:  Diagnosis Date  . 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 Cardiomyopathy ([place])    a. 12/2018 Echo: EF 40-45%, global HK. Nl RV fxn. Mild-mod LAE. Mild AI, Mild-mod MR. Asc Ao 3.7cm; b. 01/2019 Lexi MV: small, mild, fixed basal and mid antlat defect - scar vs artifact. Small, mild mid and apical inf minimally reversible defect, likely scar w/ peri-infarct ischemia. Coronary and Ao atherosclerosis.  . Complication of anesthesia    had to be woken up slowly as his bp was elevated when did this quickly  . Diabetes mellitus without complication ([place])   .  ESRD (end stage renal disease) ([place])    a. Peritoneal Dialysis pt.  . Hyperlipidemia   . Hypertension   . Migraine    cluster  . Neuromuscular disorder ([place])    left lower extrem neuropathy  . Obstructive sleep apnea    no OSA since had facial surgery with dr. Kathyrn Sheriff in 1997  . PAD (peripheral artery disease) [facility]) Feb 2009   nonobstructing, renal angiogram ([place])  . Renal cell carcinoma 2004   left kidney heminephrectomy  . Renal insufficiency   . TIA (transient ischemic attack)    no residual but left leg and foot still feel heavy  . tobacco abuse     Surgical History: Past Surgical History:  Procedure Laterality Date  . CAPD INSERTION N/A 03/01/2019   Procedure: LAPAROSCOPIC INSERTION CONTINUOUS AMBULATORY PERITONEAL DIALYSIS  (CAPD) CATHETER;  Surgeon: Algernon Huxley, MD;  Location: [facility] ORS;  Service: Vascular;  Laterality: N/A;  . CARDIAC CATHETERIZATION     Dr. [name] did this to assess his renal artery  . cyst removal  12/25/2015   Spine L4 and L5  . heminephrectomy  2004   for renal cell CA  . RENAL CRYOABLATION  Jan 2006   right kidney,  [name]  . sciatica      Home Medications:  Allergies as of 01/31/2020      Reactions   Irbesartan Other (See Comments)   hyperkalemia   Cymbalta [duloxetine Hcl]    Atorvastatin Other (See Comments)   Muscle  pain   Bystolic [nebivolol Hcl]    Extreme fatigue       Medication List       Accurate as of January 31, 2020 11:59 PM. If you have any questions, ask your nurse or doctor.        albuterol 108 (90 Base) MCG/ACT inhaler Commonly known as: VENTOLIN HFA Inhale 2 puffs into the lungs every 6 (six) hours as needed for wheezing or shortness of breath.   amLODipine 10 MG tablet Commonly known as: NORVASC TAKE 1 TABLET BY MOUTH ONCE DAILY   aspirin 81 MG tablet Take 1 tablet (81 mg total) by mouth daily.   Benadryl 25 MG tablet Generic drug: diphenhydrAMINE Take 25 mg by mouth every 6 (six) hours as needed.    calcitRIOL 0.25 MCG capsule Commonly known as: ROCALTROL Take 0.25 mcg by mouth daily. Take one capsule only on Monday / Wednesday / FRIDAY   carvedilol 6.25 MG tablet Commonly known as: COREG Take 6.25 mg by mouth 2 (two) times daily with a meal.   cloNIDine 0.3 mg/24hr patch Commonly known as: CATAPRES - Dosed in mg/24 hr Place 2 patches (0.6 mg total) onto the skin once a week.   clopidogrel 75 MG tablet Commonly known as: PLAVIX Take 1 tablet (75 mg total) by mouth daily.   Dutasteride-Tamsulosin HCl 0.5-0.4 MG Caps Take 1 capsule by mouth daily.   ezetimibe 10 MG tablet Commonly known as: ZETIA Take 1 tablet (10 mg total) by mouth daily.   fluticasone 50 MCG/ACT nasal spray Commonly known as: FLONASE Place 2 sprays into both nostrils daily.   gabapentin 100 MG capsule Commonly known as: NEURONTIN TAKE 1 CAPSULE BY MOUTH 3 TIMES DAILY   gentamicin cream 0.1 % Commonly known as: GARAMYCIN Apply 1 application topically daily.   losartan 100 MG tablet Commonly known as: COZAAR Take 100 mg by mouth daily.   multivitamin Tabs tablet Take 1 tablet by mouth at bedtime.   nicotine 14 mg/24hr patch Commonly known as: NICODERM CQ - dosed in mg/24 hours 14 mg patch daily to chest wall, okay to substitute generic   rosuvastatin 20 MG tablet Commonly known as: Crestor Take 1 tablet (20 mg total) by mouth at bedtime.   sodium bicarbonate 325 MG tablet TK 1 T PO BID       Allergies:  Allergies  Allergen Reactions  . Irbesartan Other (See Comments)    hyperkalemia  . Cymbalta [Duloxetine Hcl]   . Atorvastatin Other (See Comments)    Muscle pain  . Bystolic [Nebivolol Hcl]     Extreme fatigue     Family History: Family History  Problem Relation Age of Onset  . Hypertension Mother   . Cancer Mother        breast  . Aneurysm Mother   . Coronary artery disease Father   . Hypertension Father   . Stroke Father 95  . Heart disease Father   . Heart attack  Father 2  . Aneurysm Maternal Grandmother        brain  . Aneurysm Paternal Grandmother        brain  . Coronary artery disease Paternal Grandfather   . Heart disease Brother        valvular heart disease  . COPD Brother   . Hypertension Brother   . Stroke Paternal Uncle     Social History:  reports that he has been smoking cigarettes. He has a 50.00 pack-year smoking history. He has never  used smokeless tobacco. He reports previous alcohol use. He reports that he does not use drugs.   Physical Exam: BP (!) 146/78   Pulse 78   Ht 5\' 7"  (1.702 m)   Wt 141 lb (64 kg)   BMI 22.08 kg/m   Constitutional:  Alert and oriented, No acute distress. HEENT: [place] AT, moist mucus membranes.  Trachea midline, no masses. Cardiovascular: No clubbing, cyanosis, or edema. Respiratory: Normal respiratory effort, no increased work of breathing. GU: Prostate 40 g, smooth without nodules Skin: No rashes, bruises or suspicious lesions. Neurologic: Grossly intact, no focal deficits, moving all 4 extremities. Psychiatric: Normal mood and affect.   Assessment & Plan:    1.  T1c low risk prostate cancer  3 year PSA hiatus with increase 5.82-9.98  Was having significant LUTS and just started tamsulosin 2 weeks ago with significant improvement  Recommend repeat PSA 1 month and if remains elevated above baseline we will schedule prostate MRI  2.  History renal cell carcinoma  Last imaging 2014  If PSA remains elevated and prostate MRI ordered will also include abdominal MRI  Will otherwise schedule renal ultrasound   Abbie Sons, MD  [place] [address], [place], [place] [address] [phone]

## 2020-02-03 DIAGNOSIS — D509 Iron deficiency anemia, unspecified: Secondary | ICD-10-CM | POA: Diagnosis not present

## 2020-02-03 DIAGNOSIS — N186 End stage renal disease: Secondary | ICD-10-CM | POA: Diagnosis not present

## 2020-02-03 DIAGNOSIS — Z992 Dependence on renal dialysis: Secondary | ICD-10-CM | POA: Diagnosis not present

## 2020-02-04 DIAGNOSIS — Z992 Dependence on renal dialysis: Secondary | ICD-10-CM | POA: Diagnosis not present

## 2020-02-04 DIAGNOSIS — N186 End stage renal disease: Secondary | ICD-10-CM | POA: Diagnosis not present

## 2020-02-04 DIAGNOSIS — D509 Iron deficiency anemia, unspecified: Secondary | ICD-10-CM | POA: Diagnosis not present

## 2020-02-05 DIAGNOSIS — N186 End stage renal disease: Secondary | ICD-10-CM | POA: Diagnosis not present

## 2020-02-05 DIAGNOSIS — D509 Iron deficiency anemia, unspecified: Secondary | ICD-10-CM | POA: Diagnosis not present

## 2020-02-05 DIAGNOSIS — Z992 Dependence on renal dialysis: Secondary | ICD-10-CM | POA: Diagnosis not present

## 2020-02-06 DIAGNOSIS — Z992 Dependence on renal dialysis: Secondary | ICD-10-CM | POA: Diagnosis not present

## 2020-02-06 DIAGNOSIS — D509 Iron deficiency anemia, unspecified: Secondary | ICD-10-CM | POA: Diagnosis not present

## 2020-02-06 DIAGNOSIS — N186 End stage renal disease: Secondary | ICD-10-CM | POA: Diagnosis not present

## 2020-02-07 DIAGNOSIS — N186 End stage renal disease: Secondary | ICD-10-CM | POA: Diagnosis not present

## 2020-02-07 DIAGNOSIS — Z992 Dependence on renal dialysis: Secondary | ICD-10-CM | POA: Diagnosis not present

## 2020-02-07 DIAGNOSIS — D509 Iron deficiency anemia, unspecified: Secondary | ICD-10-CM | POA: Diagnosis not present

## 2020-02-08 DIAGNOSIS — D509 Iron deficiency anemia, unspecified: Secondary | ICD-10-CM | POA: Diagnosis not present

## 2020-02-08 DIAGNOSIS — Z992 Dependence on renal dialysis: Secondary | ICD-10-CM | POA: Diagnosis not present

## 2020-02-08 DIAGNOSIS — N186 End stage renal disease: Secondary | ICD-10-CM | POA: Diagnosis not present

## 2020-02-09 DIAGNOSIS — Z992 Dependence on renal dialysis: Secondary | ICD-10-CM | POA: Diagnosis not present

## 2020-02-09 DIAGNOSIS — N186 End stage renal disease: Secondary | ICD-10-CM | POA: Diagnosis not present

## 2020-02-09 DIAGNOSIS — D509 Iron deficiency anemia, unspecified: Secondary | ICD-10-CM | POA: Diagnosis not present

## 2020-02-10 DIAGNOSIS — Z992 Dependence on renal dialysis: Secondary | ICD-10-CM | POA: Diagnosis not present

## 2020-02-10 DIAGNOSIS — D509 Iron deficiency anemia, unspecified: Secondary | ICD-10-CM | POA: Diagnosis not present

## 2020-02-10 DIAGNOSIS — N186 End stage renal disease: Secondary | ICD-10-CM | POA: Diagnosis not present

## 2020-02-11 DIAGNOSIS — N186 End stage renal disease: Secondary | ICD-10-CM | POA: Diagnosis not present

## 2020-02-11 DIAGNOSIS — D509 Iron deficiency anemia, unspecified: Secondary | ICD-10-CM | POA: Diagnosis not present

## 2020-02-11 DIAGNOSIS — Z992 Dependence on renal dialysis: Secondary | ICD-10-CM | POA: Diagnosis not present

## 2020-02-12 DIAGNOSIS — D509 Iron deficiency anemia, unspecified: Secondary | ICD-10-CM | POA: Diagnosis not present

## 2020-02-12 DIAGNOSIS — Z992 Dependence on renal dialysis: Secondary | ICD-10-CM | POA: Diagnosis not present

## 2020-02-12 DIAGNOSIS — N186 End stage renal disease: Secondary | ICD-10-CM | POA: Diagnosis not present

## 2020-02-13 ENCOUNTER — Telehealth: Payer: Self-pay

## 2020-02-13 ENCOUNTER — Ambulatory Visit (INDEPENDENT_AMBULATORY_CARE_PROVIDER_SITE_OTHER): Payer: Medicare Other

## 2020-02-13 ENCOUNTER — Other Ambulatory Visit: Payer: Self-pay

## 2020-02-13 DIAGNOSIS — N186 End stage renal disease: Secondary | ICD-10-CM | POA: Diagnosis not present

## 2020-02-13 DIAGNOSIS — Z992 Dependence on renal dialysis: Secondary | ICD-10-CM | POA: Diagnosis not present

## 2020-02-13 DIAGNOSIS — D509 Iron deficiency anemia, unspecified: Secondary | ICD-10-CM | POA: Diagnosis not present

## 2020-02-13 DIAGNOSIS — I639 Cerebral infarction, unspecified: Secondary | ICD-10-CM | POA: Diagnosis not present

## 2020-02-13 NOTE — Telephone Encounter (Signed)
Patient notified needs to have a Zio AT monitor placed today or tomorrow if he can. Ignacia Bayley, NP said for the patient  to r/s his follow up appointment on Sept. 30, 2021 and will need to be seen around Nov. 8 in order to have the patients results back from the monitor.  The patient had the Zio At monitor placed today and he will receive another 14 day in the mail.

## 2020-02-13 NOTE — Telephone Encounter (Signed)
Left a message on the patients answering machine to contact our office regarding a Zio monitor. It appears the patient was to wear a Zio monitor post his hospitalization.

## 2020-02-13 NOTE — Telephone Encounter (Addendum)
Spoke with the patient regarding his Zio monitor. The patient was told he would receive a Zio monitor in the mail but has not received it yet. He was told by someone in our office the Zio monitors are on back order. The patient also has a follow up appointment with Ignacia Bayley, NP on Sept. 30, 2021. I have checked in [employer] and the patient doesn't appear in the system. I also checked with Markus Daft from the [place] office too and she has not received any information on having this monitor placed either. Please contact the patient with instructions on what to do.

## 2020-02-14 DIAGNOSIS — N186 End stage renal disease: Secondary | ICD-10-CM | POA: Diagnosis not present

## 2020-02-14 DIAGNOSIS — I639 Cerebral infarction, unspecified: Secondary | ICD-10-CM | POA: Diagnosis not present

## 2020-02-14 DIAGNOSIS — Z992 Dependence on renal dialysis: Secondary | ICD-10-CM | POA: Diagnosis not present

## 2020-02-14 DIAGNOSIS — D509 Iron deficiency anemia, unspecified: Secondary | ICD-10-CM | POA: Diagnosis not present

## 2020-02-15 ENCOUNTER — Other Ambulatory Visit (INDEPENDENT_AMBULATORY_CARE_PROVIDER_SITE_OTHER): Payer: Self-pay | Admitting: Vascular Surgery

## 2020-02-15 DIAGNOSIS — N186 End stage renal disease: Secondary | ICD-10-CM | POA: Diagnosis not present

## 2020-02-15 DIAGNOSIS — I6523 Occlusion and stenosis of bilateral carotid arteries: Secondary | ICD-10-CM

## 2020-02-15 DIAGNOSIS — Z992 Dependence on renal dialysis: Secondary | ICD-10-CM | POA: Diagnosis not present

## 2020-02-15 DIAGNOSIS — D509 Iron deficiency anemia, unspecified: Secondary | ICD-10-CM | POA: Diagnosis not present

## 2020-02-15 DIAGNOSIS — I7779 Dissection of other artery: Secondary | ICD-10-CM

## 2020-02-16 DIAGNOSIS — D509 Iron deficiency anemia, unspecified: Secondary | ICD-10-CM | POA: Diagnosis not present

## 2020-02-16 DIAGNOSIS — Z992 Dependence on renal dialysis: Secondary | ICD-10-CM | POA: Diagnosis not present

## 2020-02-16 DIAGNOSIS — N186 End stage renal disease: Secondary | ICD-10-CM | POA: Diagnosis not present

## 2020-02-17 DIAGNOSIS — N186 End stage renal disease: Secondary | ICD-10-CM | POA: Diagnosis not present

## 2020-02-17 DIAGNOSIS — D509 Iron deficiency anemia, unspecified: Secondary | ICD-10-CM | POA: Diagnosis not present

## 2020-02-17 DIAGNOSIS — Z992 Dependence on renal dialysis: Secondary | ICD-10-CM | POA: Diagnosis not present

## 2020-02-18 DIAGNOSIS — Z992 Dependence on renal dialysis: Secondary | ICD-10-CM | POA: Diagnosis not present

## 2020-02-18 DIAGNOSIS — D509 Iron deficiency anemia, unspecified: Secondary | ICD-10-CM | POA: Diagnosis not present

## 2020-02-18 DIAGNOSIS — N186 End stage renal disease: Secondary | ICD-10-CM | POA: Diagnosis not present

## 2020-02-19 ENCOUNTER — Other Ambulatory Visit (INDEPENDENT_AMBULATORY_CARE_PROVIDER_SITE_OTHER): Payer: Medicare Other

## 2020-02-19 ENCOUNTER — Ambulatory Visit (INDEPENDENT_AMBULATORY_CARE_PROVIDER_SITE_OTHER): Payer: Medicare Other | Admitting: Vascular Surgery

## 2020-02-19 ENCOUNTER — Encounter (INDEPENDENT_AMBULATORY_CARE_PROVIDER_SITE_OTHER): Payer: Self-pay | Admitting: Vascular Surgery

## 2020-02-19 ENCOUNTER — Other Ambulatory Visit: Payer: Self-pay

## 2020-02-19 ENCOUNTER — Ambulatory Visit (INDEPENDENT_AMBULATORY_CARE_PROVIDER_SITE_OTHER): Payer: Medicare Other

## 2020-02-19 VITALS — BP 172/78 | HR 72 | Ht 66.0 in | Wt 144.0 lb

## 2020-02-19 DIAGNOSIS — I739 Peripheral vascular disease, unspecified: Secondary | ICD-10-CM

## 2020-02-19 DIAGNOSIS — D509 Iron deficiency anemia, unspecified: Secondary | ICD-10-CM | POA: Diagnosis not present

## 2020-02-19 DIAGNOSIS — E1122 Type 2 diabetes mellitus with diabetic chronic kidney disease: Secondary | ICD-10-CM

## 2020-02-19 DIAGNOSIS — I6523 Occlusion and stenosis of bilateral carotid arteries: Secondary | ICD-10-CM | POA: Diagnosis not present

## 2020-02-19 DIAGNOSIS — E7800 Pure hypercholesterolemia, unspecified: Secondary | ICD-10-CM

## 2020-02-19 DIAGNOSIS — I7779 Dissection of other artery: Secondary | ICD-10-CM

## 2020-02-19 DIAGNOSIS — N186 End stage renal disease: Secondary | ICD-10-CM | POA: Diagnosis not present

## 2020-02-19 DIAGNOSIS — Z992 Dependence on renal dialysis: Secondary | ICD-10-CM | POA: Diagnosis not present

## 2020-02-19 DIAGNOSIS — I639 Cerebral infarction, unspecified: Secondary | ICD-10-CM

## 2020-02-19 NOTE — Assessment & Plan Note (Signed)
blood glucose control important in reducing the progression of atherosclerotic disease. Also, involved in wound healing. On appropriate medications.  

## 2020-02-19 NOTE — Assessment & Plan Note (Signed)
A carotid duplex showed 1 to 39% bilateral carotid artery stenosis with strong multiphasic waveforms in the left subclavian artery consistent with a nonhemodynamically significant, small left subclavian artery dissection. No role for intervention with this.  This can be checked on an annual basis with duplex or sooner if problems develop in the interim.  Continue current medical regimen.

## 2020-02-19 NOTE — Assessment & Plan Note (Signed)
The patient describes leg pain with activity with multiple atherosclerotic risk factors and history of atherosclerotic disease elsewhere.  It has been quite sometime since he has had his arterial system checked in the lower extremities I would recommend ABIs at his convenience.

## 2020-02-19 NOTE — Assessment & Plan Note (Signed)
lipid control important in reducing the progression of atherosclerotic disease. Continue statin therapy  

## 2020-02-19 NOTE — Progress Notes (Signed)
MRN : [id number]  [name] is a 74 y.o. (1946-04-11) male who presents with chief complaint of  Chief Complaint  Patient presents with  . Follow-up    1 mo [facility] fu  .  History of Present Illness: Patient returns in follow-up.  He was seen about a month ago in the hospital where a CT scan had shown a small left subclavian artery dissection.  He has no left arm claudication, rest pain, or tissue loss.  No current significant vertebrobasilar system symptoms.  A carotid duplex showed 1 to 39% bilateral carotid artery stenosis with strong multiphasic waveforms in the left subclavian artery consistent with a nonhemodynamically significant, small left subclavian artery dissection.  The patient's biggest complaint today is of leg pain with activity.  He says he can only walk about 50 to 70 feet before having to stop because of radiating pain down his thighs and calves.  Both legs are affected.  He has some chronic numbness in his left foot after previous TIA/stroke some years ago.  This pain with activity is a different sensation.  This is more of a cramping and burning that is only relieved with rest.  Current Outpatient Medications  Medication Sig Dispense Refill  . albuterol (VENTOLIN HFA) 108 (90 Base) MCG/ACT inhaler Inhale 2 puffs into the lungs every 6 (six) hours as needed for wheezing or shortness of breath. 18 g 0  . amLODipine (NORVASC) 10 MG tablet TAKE 1 TABLET BY MOUTH ONCE DAILY 90 tablet 1  . aspirin 81 MG tablet Take 1 tablet (81 mg total) by mouth daily. 30 tablet 11  . calcitRIOL (ROCALTROL) 0.25 MCG capsule Take 0.25 mcg by mouth daily. Take one capsule only on Monday / Wednesday / FRIDAY  3  . cloNIDine (CATAPRES - DOSED IN MG/24 HR) 0.3 mg/24hr patch Place 2 patches (0.6 mg total) onto the skin once a week. 4 patch 0  . clopidogrel (PLAVIX) 75 MG tablet Take 1 tablet (75 mg total) by mouth daily. 21 tablet 0  . Dutasteride-Tamsulosin HCl 0.5-0.4 MG CAPS Take 1 capsule by  mouth daily. 30 capsule 5  . fluticasone (FLONASE) 50 MCG/ACT nasal spray Place 2 sprays into both nostrils daily. 16 g 6  . gabapentin (NEURONTIN) 100 MG capsule TAKE 1 CAPSULE BY MOUTH 3 TIMES DAILY 90 capsule 1  . gentamicin cream (GARAMYCIN) 0.1 % Apply 1 application topically daily. 15 g 0  . losartan (COZAAR) 100 MG tablet Take 100 mg by mouth daily.    . multivitamin (RENA-VIT) TABS tablet Take 1 tablet by mouth at bedtime.    . nicotine (NICODERM CQ - DOSED IN MG/24 HOURS) 14 mg/24hr patch 14 mg patch daily to chest wall, okay to substitute generic 28 patch 0  . rosuvastatin (CRESTOR) 20 MG tablet Take 1 tablet (20 mg total) by mouth at bedtime. 30 tablet 0  . sodium bicarbonate 325 MG tablet TK 1 T PO BID  1  . carvedilol (COREG) 6.25 MG tablet Take 6.25 mg by mouth 2 (two) times daily with a meal.   6  . diphenhydrAMINE (BENADRYL) 25 MG tablet Take 25 mg by mouth every 6 (six) hours as needed. (Patient not taking: Reported on 02/19/2020)    . ezetimibe (ZETIA) 10 MG tablet Take 1 tablet (10 mg total) by mouth daily. 90 tablet 3   Current Facility-Administered Medications  Medication Dose Route Frequency Provider Last Rate Last Admin  . ipratropium-albuterol (DUONEB) 0.5-2.5 (3) MG/3ML nebulizer solution  3 mL  3 mL Nebulization Q6H Crecencio Mc, MD        Past Medical History:  Diagnosis Date  . 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 Cardiomyopathy ([place])    a. 12/2018 Echo: EF 40-45%, global HK. Nl RV fxn. Mild-mod LAE. Mild AI, Mild-mod MR. Asc Ao 3.7cm; b. 01/2019 Lexi MV: small, mild, fixed basal and mid antlat defect - scar vs artifact. Small, mild mid and apical inf minimally reversible defect, likely scar w/ peri-infarct ischemia. Coronary and Ao atherosclerosis.  . Complication of anesthesia    had to be woken up slowly as his bp was elevated when did this quickly  . Diabetes mellitus without complication ([place])   . ESRD (end stage  renal disease) ([place])    a. Peritoneal Dialysis pt.  . Hyperlipidemia   . Hypertension   . Migraine    cluster  . Neuromuscular disorder ([place])    left lower extrem neuropathy  . Obstructive sleep apnea    no OSA since had facial surgery with dr. Kathyrn Sheriff in 1997  . PAD (peripheral artery disease) [facility]) Feb 2009   nonobstructing, renal angiogram ([place])  . Renal cell carcinoma 2004   left kidney heminephrectomy  . Renal insufficiency   . TIA (transient ischemic attack)    no residual but left leg and foot still feel heavy  . tobacco abuse     Past Surgical History:  Procedure Laterality Date  . CAPD INSERTION N/A 03/01/2019   Procedure: LAPAROSCOPIC INSERTION CONTINUOUS AMBULATORY PERITONEAL DIALYSIS  (CAPD) CATHETER;  Surgeon: Algernon Huxley, MD;  Location: [facility] ORS;  Service: Vascular;  Laterality: N/A;  . CARDIAC CATHETERIZATION     Dr. [name] did this to assess his renal artery  . cyst removal  12/25/2015   Spine L4 and L5  . heminephrectomy  2004   for renal cell CA  . RENAL CRYOABLATION  Jan 2006   right kidney,  [name]  . sciatica       Social History   Tobacco Use  . Smoking status: Current Every Day Smoker    Packs/day: 1.00    Years: 50.00    Pack years: 50.00    Types: Cigarettes  . Smokeless tobacco: Never Used  Vaping Use  . Vaping Use: Former  . Devices: tried but did not like  Substance Use Topics  . Alcohol use: Not Currently  . Drug use: No     Family History  Problem Relation Age of Onset  . Hypertension Mother   . Cancer Mother        breast  . Aneurysm Mother   . Coronary artery disease Father   . Hypertension Father   . Stroke Father 3  . Heart disease Father   . Heart attack Father 31  . Aneurysm Maternal Grandmother        brain  . Aneurysm Paternal Grandmother        brain  . Coronary artery disease Paternal Grandfather   . Heart disease Brother        valvular heart disease  . COPD Brother   . Hypertension Brother   . Stroke  Paternal Uncle      Allergies  Allergen Reactions  . Irbesartan Other (See Comments)    hyperkalemia  . Cymbalta [Duloxetine Hcl]   . Atorvastatin Other (See Comments)    Muscle pain  . Bystolic [Nebivolol Hcl]     Extreme fatigue  REVIEW OF SYSTEMS (Negative unless checked)  Constitutional: [] Weight loss  [] Fever  [] Chills Cardiac: [] Chest pain   [] Chest pressure   [] Palpitations   [] Shortness of breath when laying flat   [] Shortness of breath at rest   [x] Shortness of breath with exertion. Vascular:  [x] Pain in legs with walking   [] Pain in legs at rest   [] Pain in legs when laying flat   [] Claudication   [] Pain in feet when walking  [] Pain in feet at rest  [] Pain in feet when laying flat   [] History of DVT   [] Phlebitis   [] Swelling in legs   [] Varicose veins   [] Non-healing ulcers Pulmonary:   [] Uses home oxygen   [] Productive cough   [] Hemoptysis   [] Wheeze  [] COPD   [] Asthma Neurologic:  [] Dizziness  [] Blackouts   [] Seizures   [x] History of stroke   [x] History of TIA  [] Aphasia   [] Temporary blindness   [] Dysphagia   [] Weakness or numbness in arms   [x] Weakness or numbness in legs Musculoskeletal:  [] Arthritis   [] Joint swelling   [] Joint pain   [] Low back pain Hematologic:  [] Easy bruising  [] Easy bleeding   [] Hypercoagulable state   [x] Anemic  [] Hepatitis Gastrointestinal:  [] Blood in stool   [] Vomiting blood  [] Gastroesophageal reflux/heartburn   [] Difficulty swallowing. Genitourinary:  [x] Chronic kidney disease   [] Difficult urination  [] Frequent urination  [] Burning with urination   [] Blood in urine Skin:  [] Rashes   [] Ulcers   [] Wounds Psychological:  [] History of anxiety   []  History of major depression.  Physical Examination  Vitals:   02/19/20 1341  BP: (!) 172/78  Pulse: 72  Weight: 144 lb (65.3 kg)  Height: 5\' 6"  (1.676 m)   Body mass index is 23.24 kg/m. Gen:  WD/WN, NAD Head: [place]/AT, No temporalis wasting. Ear/Nose/Throat: Hearing grossly intact, nares  w/o erythema or drainage, trachea midline Eyes: Conjunctiva clear. Sclera non-icteric Neck: Supple.  Trachea midline Pulmonary:  Good air movement, no use of accessory muscles Cardiac: RRR, No JVD Vascular:  Vessel Right Left  Radial Palpable Palpable                          PT  1+ palpable  1+ palpable  DP  2+ palpable  1+ palpable    Musculoskeletal: M/S 5/5 throughout.  No deformity or atrophy.  No edema. Neurologic: CN 2-12 intact. Sensation grossly intact in extremities.  Symmetrical.  Speech is fluent. Motor exam as listed above. Psychiatric: Judgment intact, Mood & affect appropriate for pt's clinical situation. Dermatologic: No rashes or ulcers noted.  No cellulitis or open wounds.      CBC Lab Results  Component Value Date   WBC 12.2 (H) 01/14/2020   HGB 11.6 (L) 01/14/2020   HCT 32.6 (L) 01/14/2020   MCV 85.1 01/14/2020   PLT 215 01/14/2020    BMET    Component Value Date/Time   NA 139 01/14/2020 0813   NA 136 (A) 04/19/2018 0000   NA 139 07/04/2012 1117   K 3.7 01/14/2020 0813   K 4.3 07/04/2012 1117   CL 102 01/14/2020 0813   CL 102 07/04/2012 1117   CO2 23 01/14/2020 0813   CO2 29 07/04/2012 1117   GLUCOSE 121 (H) 01/14/2020 0813   GLUCOSE 88 07/04/2012 1117   BUN 62 (H) 01/14/2020 0813   BUN 44 (A) 04/19/2018 0000   BUN 15 07/04/2012 1117   CREATININE 7.60 (H) 01/14/2020 0813   CREATININE  1.92 (H) 07/18/2015 1549   CALCIUM 8.8 (L) 01/14/2020 0813   CALCIUM 9.1 07/04/2012 1117   GFRNONAA 6 (L) 01/14/2020 0813   GFRNONAA 40 (L) 07/04/2012 1117   GFRAA 7 (L) 01/14/2020 0813   GFRAA 47 (L) 07/04/2012 1117   CrCl cannot be calculated (Patient's most recent lab result is older than the maximum 21 days allowed.).  COAG Lab Results  Component Value Date   INR 1.0 01/14/2020   INR 1.1 02/26/2019   INR 0.9 01/03/2012    Radiology No results found.   Assessment/Plan DM (diabetes mellitus), type 2 with renal complications (HCC) blood  glucose control important in reducing the progression of atherosclerotic disease. Also, involved in wound healing. On appropriate medications.   ESRD (end stage renal disease) ([place]) PD catheter placed about a year ago.  Hyperlipidemia lipid control important in reducing the progression of atherosclerotic disease. Continue statin therapy   PAD (peripheral artery disease) ([place]) The patient describes leg pain with activity with multiple atherosclerotic risk factors and history of atherosclerotic disease elsewhere.  It has been quite sometime since he has had his arterial system checked in the lower extremities I would recommend ABIs at his convenience.  Dissection of left subclavian artery (HCC) A carotid duplex showed 1 to 39% bilateral carotid artery stenosis with strong multiphasic waveforms in the left subclavian artery consistent with a nonhemodynamically significant, small left subclavian artery dissection. No role for intervention with this.  This can be checked on an annual basis with duplex or sooner if problems develop in the interim.  Continue current medical regimen.    Leotis Pain, MD  02/19/2020 2:24 PM    This note was created with Dragon medical transcription system.  Any errors from dictation are purely unintentional

## 2020-02-19 NOTE — Assessment & Plan Note (Signed)
PD catheter placed about a year ago.

## 2020-02-20 DIAGNOSIS — Z992 Dependence on renal dialysis: Secondary | ICD-10-CM | POA: Diagnosis not present

## 2020-02-20 DIAGNOSIS — N186 End stage renal disease: Secondary | ICD-10-CM | POA: Diagnosis not present

## 2020-02-20 DIAGNOSIS — D509 Iron deficiency anemia, unspecified: Secondary | ICD-10-CM | POA: Diagnosis not present

## 2020-02-21 ENCOUNTER — Ambulatory Visit: Payer: Medicare Other | Admitting: Nurse Practitioner

## 2020-02-21 DIAGNOSIS — D509 Iron deficiency anemia, unspecified: Secondary | ICD-10-CM | POA: Diagnosis not present

## 2020-02-21 DIAGNOSIS — Z992 Dependence on renal dialysis: Secondary | ICD-10-CM | POA: Diagnosis not present

## 2020-02-21 DIAGNOSIS — N186 End stage renal disease: Secondary | ICD-10-CM | POA: Diagnosis not present

## 2020-02-22 DIAGNOSIS — N186 End stage renal disease: Secondary | ICD-10-CM | POA: Diagnosis not present

## 2020-02-22 DIAGNOSIS — D509 Iron deficiency anemia, unspecified: Secondary | ICD-10-CM | POA: Diagnosis not present

## 2020-02-22 DIAGNOSIS — Z992 Dependence on renal dialysis: Secondary | ICD-10-CM | POA: Diagnosis not present

## 2020-02-22 DIAGNOSIS — D631 Anemia in chronic kidney disease: Secondary | ICD-10-CM | POA: Diagnosis not present

## 2020-02-22 DIAGNOSIS — N2581 Secondary hyperparathyroidism of renal origin: Secondary | ICD-10-CM | POA: Diagnosis not present

## 2020-02-23 DIAGNOSIS — N186 End stage renal disease: Secondary | ICD-10-CM | POA: Diagnosis not present

## 2020-02-23 DIAGNOSIS — N2581 Secondary hyperparathyroidism of renal origin: Secondary | ICD-10-CM | POA: Diagnosis not present

## 2020-02-23 DIAGNOSIS — D631 Anemia in chronic kidney disease: Secondary | ICD-10-CM | POA: Diagnosis not present

## 2020-02-23 DIAGNOSIS — D509 Iron deficiency anemia, unspecified: Secondary | ICD-10-CM | POA: Diagnosis not present

## 2020-02-23 DIAGNOSIS — Z992 Dependence on renal dialysis: Secondary | ICD-10-CM | POA: Diagnosis not present

## 2020-02-24 DIAGNOSIS — N186 End stage renal disease: Secondary | ICD-10-CM | POA: Diagnosis not present

## 2020-02-24 DIAGNOSIS — Z992 Dependence on renal dialysis: Secondary | ICD-10-CM | POA: Diagnosis not present

## 2020-02-24 DIAGNOSIS — D509 Iron deficiency anemia, unspecified: Secondary | ICD-10-CM | POA: Diagnosis not present

## 2020-02-24 DIAGNOSIS — D631 Anemia in chronic kidney disease: Secondary | ICD-10-CM | POA: Diagnosis not present

## 2020-02-24 DIAGNOSIS — N2581 Secondary hyperparathyroidism of renal origin: Secondary | ICD-10-CM | POA: Diagnosis not present

## 2020-02-25 DIAGNOSIS — N2581 Secondary hyperparathyroidism of renal origin: Secondary | ICD-10-CM | POA: Diagnosis not present

## 2020-02-25 DIAGNOSIS — Z992 Dependence on renal dialysis: Secondary | ICD-10-CM | POA: Diagnosis not present

## 2020-02-25 DIAGNOSIS — D509 Iron deficiency anemia, unspecified: Secondary | ICD-10-CM | POA: Diagnosis not present

## 2020-02-25 DIAGNOSIS — D631 Anemia in chronic kidney disease: Secondary | ICD-10-CM | POA: Diagnosis not present

## 2020-02-25 DIAGNOSIS — N186 End stage renal disease: Secondary | ICD-10-CM | POA: Diagnosis not present

## 2020-02-26 DIAGNOSIS — D631 Anemia in chronic kidney disease: Secondary | ICD-10-CM | POA: Diagnosis not present

## 2020-02-26 DIAGNOSIS — N186 End stage renal disease: Secondary | ICD-10-CM | POA: Diagnosis not present

## 2020-02-26 DIAGNOSIS — D509 Iron deficiency anemia, unspecified: Secondary | ICD-10-CM | POA: Diagnosis not present

## 2020-02-26 DIAGNOSIS — Z992 Dependence on renal dialysis: Secondary | ICD-10-CM | POA: Diagnosis not present

## 2020-02-26 DIAGNOSIS — N2581 Secondary hyperparathyroidism of renal origin: Secondary | ICD-10-CM | POA: Diagnosis not present

## 2020-02-27 DIAGNOSIS — N2581 Secondary hyperparathyroidism of renal origin: Secondary | ICD-10-CM | POA: Diagnosis not present

## 2020-02-27 DIAGNOSIS — D631 Anemia in chronic kidney disease: Secondary | ICD-10-CM | POA: Diagnosis not present

## 2020-02-27 DIAGNOSIS — E119 Type 2 diabetes mellitus without complications: Secondary | ICD-10-CM | POA: Diagnosis not present

## 2020-02-27 DIAGNOSIS — D509 Iron deficiency anemia, unspecified: Secondary | ICD-10-CM | POA: Diagnosis not present

## 2020-02-27 DIAGNOSIS — Z992 Dependence on renal dialysis: Secondary | ICD-10-CM | POA: Diagnosis not present

## 2020-02-27 DIAGNOSIS — N186 End stage renal disease: Secondary | ICD-10-CM | POA: Diagnosis not present

## 2020-02-28 ENCOUNTER — Other Ambulatory Visit: Payer: Self-pay

## 2020-02-28 ENCOUNTER — Other Ambulatory Visit: Payer: Medicare Other

## 2020-02-28 DIAGNOSIS — D509 Iron deficiency anemia, unspecified: Secondary | ICD-10-CM | POA: Diagnosis not present

## 2020-02-28 DIAGNOSIS — N186 End stage renal disease: Secondary | ICD-10-CM | POA: Diagnosis not present

## 2020-02-28 DIAGNOSIS — D631 Anemia in chronic kidney disease: Secondary | ICD-10-CM | POA: Diagnosis not present

## 2020-02-28 DIAGNOSIS — C61 Malignant neoplasm of prostate: Secondary | ICD-10-CM | POA: Diagnosis not present

## 2020-02-28 DIAGNOSIS — N2581 Secondary hyperparathyroidism of renal origin: Secondary | ICD-10-CM | POA: Diagnosis not present

## 2020-02-28 DIAGNOSIS — Z992 Dependence on renal dialysis: Secondary | ICD-10-CM | POA: Diagnosis not present

## 2020-02-29 DIAGNOSIS — N186 End stage renal disease: Secondary | ICD-10-CM | POA: Diagnosis not present

## 2020-02-29 DIAGNOSIS — Z992 Dependence on renal dialysis: Secondary | ICD-10-CM | POA: Diagnosis not present

## 2020-02-29 DIAGNOSIS — D631 Anemia in chronic kidney disease: Secondary | ICD-10-CM | POA: Diagnosis not present

## 2020-02-29 DIAGNOSIS — N2581 Secondary hyperparathyroidism of renal origin: Secondary | ICD-10-CM | POA: Diagnosis not present

## 2020-02-29 DIAGNOSIS — D509 Iron deficiency anemia, unspecified: Secondary | ICD-10-CM | POA: Diagnosis not present

## 2020-02-29 LAB — PSA: Prostate Specific Ag, Serum: 5.1 ng/mL — ABNORMAL HIGH (ref 0.0–4.0)

## 2020-03-01 DIAGNOSIS — Z992 Dependence on renal dialysis: Secondary | ICD-10-CM | POA: Diagnosis not present

## 2020-03-01 DIAGNOSIS — D631 Anemia in chronic kidney disease: Secondary | ICD-10-CM | POA: Diagnosis not present

## 2020-03-01 DIAGNOSIS — D509 Iron deficiency anemia, unspecified: Secondary | ICD-10-CM | POA: Diagnosis not present

## 2020-03-01 DIAGNOSIS — N186 End stage renal disease: Secondary | ICD-10-CM | POA: Diagnosis not present

## 2020-03-01 DIAGNOSIS — N2581 Secondary hyperparathyroidism of renal origin: Secondary | ICD-10-CM | POA: Diagnosis not present

## 2020-03-02 DIAGNOSIS — Z992 Dependence on renal dialysis: Secondary | ICD-10-CM | POA: Diagnosis not present

## 2020-03-02 DIAGNOSIS — D631 Anemia in chronic kidney disease: Secondary | ICD-10-CM | POA: Diagnosis not present

## 2020-03-02 DIAGNOSIS — N2581 Secondary hyperparathyroidism of renal origin: Secondary | ICD-10-CM | POA: Diagnosis not present

## 2020-03-02 DIAGNOSIS — D509 Iron deficiency anemia, unspecified: Secondary | ICD-10-CM | POA: Diagnosis not present

## 2020-03-02 DIAGNOSIS — N186 End stage renal disease: Secondary | ICD-10-CM | POA: Diagnosis not present

## 2020-03-03 DIAGNOSIS — N186 End stage renal disease: Secondary | ICD-10-CM | POA: Diagnosis not present

## 2020-03-03 DIAGNOSIS — N2581 Secondary hyperparathyroidism of renal origin: Secondary | ICD-10-CM | POA: Diagnosis not present

## 2020-03-03 DIAGNOSIS — D631 Anemia in chronic kidney disease: Secondary | ICD-10-CM | POA: Diagnosis not present

## 2020-03-03 DIAGNOSIS — Z992 Dependence on renal dialysis: Secondary | ICD-10-CM | POA: Diagnosis not present

## 2020-03-03 DIAGNOSIS — D509 Iron deficiency anemia, unspecified: Secondary | ICD-10-CM | POA: Diagnosis not present

## 2020-03-04 DIAGNOSIS — D509 Iron deficiency anemia, unspecified: Secondary | ICD-10-CM | POA: Diagnosis not present

## 2020-03-04 DIAGNOSIS — N186 End stage renal disease: Secondary | ICD-10-CM | POA: Diagnosis not present

## 2020-03-04 DIAGNOSIS — D631 Anemia in chronic kidney disease: Secondary | ICD-10-CM | POA: Diagnosis not present

## 2020-03-04 DIAGNOSIS — Z992 Dependence on renal dialysis: Secondary | ICD-10-CM | POA: Diagnosis not present

## 2020-03-04 DIAGNOSIS — N2581 Secondary hyperparathyroidism of renal origin: Secondary | ICD-10-CM | POA: Diagnosis not present

## 2020-03-05 DIAGNOSIS — Z992 Dependence on renal dialysis: Secondary | ICD-10-CM | POA: Diagnosis not present

## 2020-03-05 DIAGNOSIS — D509 Iron deficiency anemia, unspecified: Secondary | ICD-10-CM | POA: Diagnosis not present

## 2020-03-05 DIAGNOSIS — N186 End stage renal disease: Secondary | ICD-10-CM | POA: Diagnosis not present

## 2020-03-05 DIAGNOSIS — N2581 Secondary hyperparathyroidism of renal origin: Secondary | ICD-10-CM | POA: Diagnosis not present

## 2020-03-05 DIAGNOSIS — D631 Anemia in chronic kidney disease: Secondary | ICD-10-CM | POA: Diagnosis not present

## 2020-03-06 DIAGNOSIS — D509 Iron deficiency anemia, unspecified: Secondary | ICD-10-CM | POA: Diagnosis not present

## 2020-03-06 DIAGNOSIS — N2581 Secondary hyperparathyroidism of renal origin: Secondary | ICD-10-CM | POA: Diagnosis not present

## 2020-03-06 DIAGNOSIS — N186 End stage renal disease: Secondary | ICD-10-CM | POA: Diagnosis not present

## 2020-03-06 DIAGNOSIS — D631 Anemia in chronic kidney disease: Secondary | ICD-10-CM | POA: Diagnosis not present

## 2020-03-06 DIAGNOSIS — Z992 Dependence on renal dialysis: Secondary | ICD-10-CM | POA: Diagnosis not present

## 2020-03-07 ENCOUNTER — Ambulatory Visit (INDEPENDENT_AMBULATORY_CARE_PROVIDER_SITE_OTHER): Payer: Medicare Other

## 2020-03-07 ENCOUNTER — Ambulatory Visit (INDEPENDENT_AMBULATORY_CARE_PROVIDER_SITE_OTHER): Payer: Medicare Other | Admitting: Nurse Practitioner

## 2020-03-07 ENCOUNTER — Other Ambulatory Visit: Payer: Self-pay

## 2020-03-07 ENCOUNTER — Encounter (INDEPENDENT_AMBULATORY_CARE_PROVIDER_SITE_OTHER): Payer: Self-pay | Admitting: Nurse Practitioner

## 2020-03-07 VITALS — BP 149/65 | HR 68 | Ht 66.0 in | Wt 142.0 lb

## 2020-03-07 DIAGNOSIS — N186 End stage renal disease: Secondary | ICD-10-CM | POA: Diagnosis not present

## 2020-03-07 DIAGNOSIS — M79605 Pain in left leg: Secondary | ICD-10-CM

## 2020-03-07 DIAGNOSIS — M79604 Pain in right leg: Secondary | ICD-10-CM | POA: Diagnosis not present

## 2020-03-07 DIAGNOSIS — D631 Anemia in chronic kidney disease: Secondary | ICD-10-CM | POA: Diagnosis not present

## 2020-03-07 DIAGNOSIS — I7779 Dissection of other artery: Secondary | ICD-10-CM

## 2020-03-07 DIAGNOSIS — E1122 Type 2 diabetes mellitus with diabetic chronic kidney disease: Secondary | ICD-10-CM

## 2020-03-07 DIAGNOSIS — D509 Iron deficiency anemia, unspecified: Secondary | ICD-10-CM | POA: Diagnosis not present

## 2020-03-07 DIAGNOSIS — I739 Peripheral vascular disease, unspecified: Secondary | ICD-10-CM | POA: Diagnosis not present

## 2020-03-07 DIAGNOSIS — N2581 Secondary hyperparathyroidism of renal origin: Secondary | ICD-10-CM | POA: Diagnosis not present

## 2020-03-07 DIAGNOSIS — Z992 Dependence on renal dialysis: Secondary | ICD-10-CM | POA: Diagnosis not present

## 2020-03-08 DIAGNOSIS — D631 Anemia in chronic kidney disease: Secondary | ICD-10-CM | POA: Diagnosis not present

## 2020-03-08 DIAGNOSIS — N186 End stage renal disease: Secondary | ICD-10-CM | POA: Diagnosis not present

## 2020-03-08 DIAGNOSIS — N2581 Secondary hyperparathyroidism of renal origin: Secondary | ICD-10-CM | POA: Diagnosis not present

## 2020-03-08 DIAGNOSIS — Z992 Dependence on renal dialysis: Secondary | ICD-10-CM | POA: Diagnosis not present

## 2020-03-08 DIAGNOSIS — D509 Iron deficiency anemia, unspecified: Secondary | ICD-10-CM | POA: Diagnosis not present

## 2020-03-09 DIAGNOSIS — Z992 Dependence on renal dialysis: Secondary | ICD-10-CM | POA: Diagnosis not present

## 2020-03-09 DIAGNOSIS — D509 Iron deficiency anemia, unspecified: Secondary | ICD-10-CM | POA: Diagnosis not present

## 2020-03-09 DIAGNOSIS — D631 Anemia in chronic kidney disease: Secondary | ICD-10-CM | POA: Diagnosis not present

## 2020-03-09 DIAGNOSIS — N2581 Secondary hyperparathyroidism of renal origin: Secondary | ICD-10-CM | POA: Diagnosis not present

## 2020-03-09 DIAGNOSIS — N186 End stage renal disease: Secondary | ICD-10-CM | POA: Diagnosis not present

## 2020-03-10 ENCOUNTER — Telehealth: Payer: Self-pay

## 2020-03-10 ENCOUNTER — Ambulatory Visit (INDEPENDENT_AMBULATORY_CARE_PROVIDER_SITE_OTHER): Payer: Medicare Other

## 2020-03-10 VITALS — Ht 66.0 in | Wt 142.0 lb

## 2020-03-10 DIAGNOSIS — Z0000 Encounter for general adult medical examination without abnormal findings: Secondary | ICD-10-CM

## 2020-03-10 DIAGNOSIS — C61 Malignant neoplasm of prostate: Secondary | ICD-10-CM

## 2020-03-10 DIAGNOSIS — Z992 Dependence on renal dialysis: Secondary | ICD-10-CM | POA: Diagnosis not present

## 2020-03-10 DIAGNOSIS — D631 Anemia in chronic kidney disease: Secondary | ICD-10-CM | POA: Diagnosis not present

## 2020-03-10 DIAGNOSIS — N2581 Secondary hyperparathyroidism of renal origin: Secondary | ICD-10-CM | POA: Diagnosis not present

## 2020-03-10 DIAGNOSIS — D509 Iron deficiency anemia, unspecified: Secondary | ICD-10-CM | POA: Diagnosis not present

## 2020-03-10 DIAGNOSIS — N186 End stage renal disease: Secondary | ICD-10-CM | POA: Diagnosis not present

## 2020-03-10 NOTE — Telephone Encounter (Signed)
-----   Message from Abbie Sons, MD sent at 03/07/2020  5:21 PM EDT ----- Repeat PSA looks much better at 5.1 and is back to within baseline.  Recommend lab visit for repeat PSA 6 months and 1 year office visit with PSA

## 2020-03-10 NOTE — Telephone Encounter (Signed)
Called pt wife answers. Informed her of the information below. Per DPR. Verbal understanding was given. Orders placed, appts scheduled.

## 2020-03-10 NOTE — Progress Notes (Signed)
Subjective:   [name] is a 74 y.o. male who presents for Medicare Annual/Subsequent preventive examination.  Review of Systems    No ROS.  Medicare Wellness Virtual Visit.   Cardiac Risk Factors include: advanced age (>40men, >47 women);male gender;hypertension;diabetes mellitus     Objective:    Today's Vitals   03/10/20 1234  Weight: 142 lb (64.4 kg)  Height: 5\' 6"  (1.676 m)   Body mass index is 22.92 kg/m.  Advanced Directives 01/14/2020 02/26/2019 04/10/2018 04/08/2017 04/07/2016  Does Patient Have a Medical Advance Directive? No Yes Yes Yes Yes  Type of Advance Directive - [place];Living will Living will Living will;Healthcare Power of [place];Living will  Does patient want to make changes to medical advance directive? - No - Patient declined No - Patient declined No - Patient declined -  Copy of [place] in Chart? - No - copy requested - No - copy requested No - copy requested  Would patient like information on creating a medical advance directive? No - Patient declined - - - -    Current Medications (verified) Outpatient Encounter Medications as of 03/10/2020  Medication Sig  . albuterol (VENTOLIN HFA) 108 (90 Base) MCG/ACT inhaler Inhale 2 puffs into the lungs every 6 (six) hours as needed for wheezing or shortness of breath.  Marland Kitchen amLODipine (NORVASC) 10 MG tablet TAKE 1 TABLET BY MOUTH ONCE DAILY  . aspirin 81 MG tablet Take 1 tablet (81 mg total) by mouth daily.  . calcitRIOL (ROCALTROL) 0.25 MCG capsule Take 0.25 mcg by mouth daily. Take one capsule only on Monday / Wednesday / FRIDAY  . carvedilol (COREG) 6.25 MG tablet Take 6.25 mg by mouth 2 (two) times daily with a meal.   . cloNIDine (CATAPRES - DOSED IN MG/24 HR) 0.3 mg/24hr patch Place 2 patches (0.6 mg total) onto the skin once a week.  . cloNIDine (CATAPRES) 0.2 MG tablet Take 0.2 mg by mouth 3 (three) times daily.  . clopidogrel  (PLAVIX) 75 MG tablet Take 1 tablet (75 mg total) by mouth daily.  . diphenhydrAMINE (BENADRYL) 25 MG tablet Take 25 mg by mouth every 6 (six) hours as needed.   . Dutasteride-Tamsulosin HCl 0.5-0.4 MG CAPS Take 1 capsule by mouth daily.  Marland Kitchen ezetimibe (ZETIA) 10 MG tablet Take 1 tablet (10 mg total) by mouth daily.  . fluticasone (FLONASE) 50 MCG/ACT nasal spray Place 2 sprays into both nostrils daily.  Marland Kitchen gabapentin (NEURONTIN) 100 MG capsule TAKE 1 CAPSULE BY MOUTH 3 TIMES DAILY  . gentamicin cream (GARAMYCIN) 0.1 % Apply 1 application topically daily.  Marland Kitchen losartan (COZAAR) 100 MG tablet Take 100 mg by mouth daily.  . multivitamin (RENA-VIT) TABS tablet Take 1 tablet by mouth at bedtime.  . nicotine (NICODERM CQ - DOSED IN MG/24 HOURS) 14 mg/24hr patch 14 mg patch daily to chest wall, okay to substitute generic  . rosuvastatin (CRESTOR) 20 MG tablet Take 1 tablet (20 mg total) by mouth at bedtime.  . sodium bicarbonate 325 MG tablet TK 1 T PO BID   Facility-Administered Encounter Medications as of 03/10/2020  Medication  . ipratropium-albuterol (DUONEB) 0.5-2.5 (3) MG/3ML nebulizer solution 3 mL    Allergies (verified) Irbesartan, Cymbalta [duloxetine hcl], Atorvastatin, and Bystolic [nebivolol hcl]   History: Past Medical History:  Diagnosis Date  . 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 Cardiomyopathy ([place])    a. 12/2018 Echo:  EF 40-45%, global HK. Nl RV fxn. Mild-mod LAE. Mild AI, Mild-mod MR. Asc Ao 3.7cm; b. 01/2019 Lexi MV: small, mild, fixed basal and mid antlat defect - scar vs artifact. Small, mild mid and apical inf minimally reversible defect, likely scar w/ peri-infarct ischemia. Coronary and Ao atherosclerosis.  . Complication of anesthesia    had to be woken up slowly as his bp was elevated when did this quickly  . Diabetes mellitus without complication ([place])   . ESRD (end stage renal disease) ([place])    a. Peritoneal Dialysis pt.  .  Hyperlipidemia   . Hypertension   . Migraine    cluster  . Neuromuscular disorder ([place])    left lower extrem neuropathy  . Obstructive sleep apnea    no OSA since had facial surgery with dr. Kathyrn Sheriff in 1997  . PAD (peripheral artery disease) [facility]) Feb 2009   nonobstructing, renal angiogram ([place])  . Renal cell carcinoma 2004   left kidney heminephrectomy  . Renal insufficiency   . TIA (transient ischemic attack)    no residual but left leg and foot still feel heavy  . tobacco abuse    Past Surgical History:  Procedure Laterality Date  . CAPD INSERTION N/A 03/01/2019   Procedure: LAPAROSCOPIC INSERTION CONTINUOUS AMBULATORY PERITONEAL DIALYSIS  (CAPD) CATHETER;  Surgeon: Algernon Huxley, MD;  Location: [facility] ORS;  Service: Vascular;  Laterality: N/A;  . CARDIAC CATHETERIZATION     Dr. [name] did this to assess his renal artery  . cyst removal  12/25/2015   Spine L4 and L5  . heminephrectomy  2004   for renal cell CA  . RENAL CRYOABLATION  Jan 2006   right kidney,  [name]  . sciatica     Family History  Problem Relation Age of Onset  . Hypertension Mother   . Cancer Mother        breast  . Aneurysm Mother   . Coronary artery disease Father   . Hypertension Father   . Stroke Father 17  . Heart disease Father   . Heart attack Father 34  . Aneurysm Maternal Grandmother        brain  . Aneurysm Paternal Grandmother        brain  . Coronary artery disease Paternal Grandfather   . Heart disease Brother        valvular heart disease  . COPD Brother   . Hypertension Brother   . Stroke Paternal Uncle    Social History   Socioeconomic History  . Marital status: Married    Spouse name: Bailey Mech  . Number of children: Not on file  . Years of education: Not on file  . Highest education level: Not on file  Occupational History  . 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 Smoking status: Current Every Day Smoker    Packs/day: 1.00    Years: 50.00    Pack  years: 50.00    Types: Cigarettes  . Smokeless tobacco: Never Used  Vaping Use  . Vaping Use: Former  . Devices: tried but did not like  Substance and Sexual Activity  . Alcohol use: Not Currently  . Drug use: No  . Sexual activity: Yes  Other Topics Concern  . Not on file  Social History Narrative  . Not on file   Social Determinants of Health   Financial Resource Strain:   . Difficulty of Paying Living Expenses: Not on file  Food Insecurity: No Food  Insecurity  . Worried About Charity fundraiser in the Last Year: Never true  . Ran Out of Food in the Last Year: Never true  Transportation Needs: No Transportation Needs  . Lack of Transportation (Medical): No  . Lack of Transportation (Non-Medical): No  Physical Activity:   . Days of Exercise per Week: Not on file  . Minutes of Exercise per Session: Not on file  Stress: No Stress Concern Present  . Feeling of Stress : Only a little  Social Connections:   . Frequency of Communication with Friends and Family: Not on file  . Frequency of Social Gatherings with Friends and Family: Not on file  . Attends Religious Services: Not on file  . Active Member of Clubs or Organizations: Not on file  . Attends Archivist Meetings: Not on file  . Marital Status: Not on file    Tobacco Counseling Ready to quit: Not Answered Counseling given: Not Answered   Clinical Intake:  Pre-visit preparation completed: Yes        Diabetes: Yes (Followed by pcp)  How often do you need to have someone help you when you read instructions, pamphlets, or other written materials from your doctor or pharmacy?: 1 - Never Interpreter Needed?: No      Activities of Daily Living In your present state of health, do you have any difficulty performing the following activities: 03/10/2020  Hearing? Y  Comment Hearing aids  Vision? N  Difficulty concentrating or making decisions? N  Walking or climbing stairs? Y  Comment Difficulty  walking long distances  Dressing or bathing? N  Doing errands, shopping? N  Preparing Food and eating ? N  Using the Toilet? N  In the past six months, have you accidently leaked urine? N  Comment Followed by Urology  Do you have problems with loss of bowel control? N  Managing your Medications? N  Managing your Finances? N  Housekeeping or managing your Housekeeping? Y  Comment Maid assist  Some recent data might be hidden    Patient Care Team: Crecencio Mc, MD as PCP - General (Internal Medicine) End, Harrell Gave, MD as PCP - Cardiology (Cardiology)  Indicate any recent Medical Services you may have received from other than [facility] providers in the past year (date may be approximate).     Assessment:   This is a routine wellness examination for [place].  I connected with Alfons today by telephone and verified that I am speaking with the correct person using two identifiers. Location patient: home Location provider: work Persons participating in the virtual visit: patient, Marine scientist.    I discussed the limitations, risks, security and privacy concerns of performing an evaluation and management service by telephone and the availability of in person appointments. The patient expressed understanding and verbally consented to this telephonic visit.    Interactive audio and video telecommunications were attempted between this provider and patient, however failed, due to patient having technical difficulties OR patient did not have access to video capability.  We continued and completed visit with audio only.  Some vital signs may be absent or patient reported.   Hearing/Vision screen  Hearing Screening   125Hz  250Hz  500Hz  1000Hz  2000Hz  3000Hz  4000Hz  6000Hz  8000Hz   Right ear:           Left ear:           Comments: Hearing aids  Vision Screening Comments: Visual acuity not assessed, virtual visit.  Plans to schedule annual appointment  Dietary issues and exercise activities  discussed: Current Exercise Habits: The patient does not participate in regular exercise at present  Goals    . Healthy Lifestyle     Healthy diet Stay active       Depression Screen PHQ 2/9 Scores 03/10/2020 04/10/2018 04/08/2017 05/11/2016 04/07/2016 08/22/2014 05/23/2012  PHQ - 2 Score 0 0 0 0 0 0 0  PHQ- 9 Score - - 0 - - - -    Fall Risk Fall Risk  03/10/2020 01/21/2020 05/11/2019 04/12/2019 04/10/2018  Falls in the past year? (No Data) 1 0 0 0  Comment None since last reported one month ago - - - -  Number falls in past yr: - 1 - - -  Comment - - - - -  Injury with Fall? - 0 - - -  Risk for fall due to : - History of fall(s) - - -  Follow up Falls evaluation completed Falls evaluation completed Falls evaluation completed Falls evaluation completed -   Handrails in use when climbing stairs? Yes Home free of loose throw rugs in walkways, pet beds, electrical cords, etc? Yes  Adequate lighting in your home to reduce risk of falls? Yes   ASSISTIVE DEVICES UTILIZED TO PREVENT FALLS: Use of a cane, walker or w/c? No   TIMED UP AND GO: Was the test performed? No . Virtual visit.   Cognitive Function: Patient is alert and oriented x3.  Denies difficulty focusing, making decisions, memory loss.     6CIT Screen 04/10/2018 04/08/2017 04/07/2016  What Year? 0 points 0 points 0 points  What month? 0 points 0 points 0 points  What time? 0 points - 0 points  Count back from 20 0 points - 0 points  Months in reverse 0 points - 0 points    Immunizations Immunization History  Administered Date(s) Administered  . Hepatitis B, adult 04/05/2019  . Influenza-Unspecified 04/05/2019  . Moderna SARS-COVID-2 Vaccination 01/21/2020  . Pneumococcal Conjugate-13 02/15/2014  . Pneumococcal Polysaccharide-23 01/23/2013, 05/29/2018  . Pneumococcal-Unspecified 06/18/2017  . Tdap 02/03/2009, 05/29/2018   Health Maintenance Health Maintenance  Topic Date Due  . OPHTHALMOLOGY EXAM   09/09/2014  . COLONOSCOPY  05/24/2016  . FOOT EXAM  07/17/2016  . COVID-19 Vaccine (2 - Moderna 2-dose series) 02/18/2020  . HEMOGLOBIN A1C  07/16/2020  . TETANUS/TDAP  05/29/2028  . Hepatitis C Screening  Completed  . PNA vac Low Risk Adult  Completed  . INFLUENZA VACCINE  Discontinued   Dental Screening: Recommended annual dental exams for proper oral hygiene.  Community Resource Referral / Chronic Care Management: CRR required this visit?  No   CCM required this visit?  No      Plan:   Keep all routine maintenance appointments.   I have personally reviewed and noted the following in the patient's chart:   . Medical and social history . Use of alcohol, tobacco or illicit drugs  . Current medications and supplements . Functional ability and status . Nutritional status . Physical activity . Advanced directives . List of other physicians . Hospitalizations, surgeries, and ER visits in previous 12 months . Vitals . Screenings to include cognitive, depression, and falls . Referrals and appointments  In addition, I have reviewed and discussed with patient certain preventive protocols, quality metrics, and best practice recommendations. A written personalized care plan for preventive services as well as general preventive health recommendations were provided to patient via mychart.      OBrien-Blaney, Naira Standiford L, LPN  03/10/2020      

## 2020-03-10 NOTE — Patient Instructions (Addendum)
[name] , Thank you for taking time to come for your Medicare Wellness Visit. I appreciate your ongoing commitment to your health goals. Please review the following plan we discussed and let me know if I can assist you in the future.   These are the goals we discussed: Goals    . Healthy Lifestyle     Healthy diet Stay active        This is a list of the screening recommended for you and due dates:  Health Maintenance  Topic Date Due  . Eye exam for diabetics  09/09/2014  . Colon Cancer Screening  05/24/2016  . Complete foot exam   07/17/2016  . COVID-19 Vaccine (2 - Moderna 2-dose series) 02/18/2020  . Hemoglobin A1C  07/16/2020  . Tetanus Vaccine  05/29/2028  .  Hepatitis C: One time screening is recommended by Center for Disease Control  (CDC) for  adults born from [birth year] through 1965.   Completed  . Pneumonia vaccines  Completed  . Flu Shot  Discontinued    Immunizations Immunization History  Administered Date(s) Administered  . Hepatitis B, adult 04/05/2019  . Influenza-Unspecified 04/05/2019  . Pneumococcal Conjugate-13 02/15/2014  . Pneumococcal Polysaccharide-23 01/23/2013, 05/29/2018  . Pneumococcal-Unspecified 06/18/2017  . Tdap 02/03/2009, 05/29/2018   Advanced directives: End of life planning; Advance aging; Advanced directives discussed.  Copy of current HCPOA/Living Will requested.    Conditions/risks identified: none new.  Follow up in one year for your annual wellness visit.   Preventive Care 36 Years and Older, Male Preventive care refers to lifestyle choices and visits with your health care provider that can promote health and wellness. What does preventive care include?  A yearly physical exam. This is also called an annual well check.  Dental exams once or twice a year.  Routine eye exams. Ask your health care provider how often you should have your eyes checked.  Personal lifestyle choices, including:  Daily care of your teeth and  gums.  Regular physical activity.  Eating a healthy diet.  Avoiding tobacco and drug use.  Limiting alcohol use.  Practicing safe sex.  Taking low doses of aspirin every day.  Taking vitamin and mineral supplements as recommended by your health care provider. What happens during an annual well check? The services and screenings done by your health care provider during your annual well check will depend on your age, overall health, lifestyle risk factors, and family history of disease. Counseling  Your health care provider may ask you questions about your:  Alcohol use.  Tobacco use.  Drug use.  Emotional well-being.  Home and relationship well-being.  Sexual activity.  Eating habits.  History of falls.  Memory and ability to understand (cognition).  Work and work Statistician. Screening  You may have the following tests or measurements:  Height, weight, and BMI.  Blood pressure.  Lipid and cholesterol levels. These may be checked every 5 years, or more frequently if you are over 82 years old.  Skin check.  Lung cancer screening. You may have this screening every year starting at age 48 if you have a 30-pack-year history of smoking and currently smoke or have quit within the past 15 years.  Fecal occult blood test (FOBT) of the stool. You may have this test every year starting at age 34.  Flexible sigmoidoscopy or colonoscopy. You may have a sigmoidoscopy every 5 years or a colonoscopy every 10 years starting at age 38.  Prostate cancer screening. Recommendations will vary  depending on your family history and other risks.  Hepatitis C blood test.  Hepatitis B blood test.  Sexually transmitted disease (STD) testing.  Diabetes screening. This is done by checking your blood sugar (glucose) after you have not eaten for a while (fasting). You may have this done every 1-3 years.  Abdominal aortic aneurysm (AAA) screening. You may need this if you are a  current or former smoker.  Osteoporosis. You may be screened starting at age 70 if you are at high risk. Talk with your health care provider about your test results, treatment options, and if necessary, the need for more tests. Vaccines  Your health care provider may recommend certain vaccines, such as:  Influenza vaccine. This is recommended every year.  Tetanus, diphtheria, and acellular pertussis (Tdap, Td) vaccine. You may need a Td booster every 10 years.  Zoster vaccine. You may need this after age 18.  Pneumococcal 13-valent conjugate (PCV13) vaccine. One dose is recommended after age 61.  Pneumococcal polysaccharide (PPSV23) vaccine. One dose is recommended after age 42. Talk to your health care provider about which screenings and vaccines you need and how often you need them. This information is not intended to replace advice given to you by your health care provider. Make sure you discuss any questions you have with your health care provider. Document Released: 06/06/2015 Document Revised: 01/28/2016 Document Reviewed: 03/11/2015 Elsevier Interactive Patient Education  2017 [employer] Prevention in the Home Falls can cause injuries. They can happen to people of all ages. There are many things you can do to make your home safe and to help prevent falls. What can I do on the outside of my home?  Regularly fix the edges of walkways and driveways and fix any cracks.  Remove anything that might make you trip as you walk through a door, such as a raised step or threshold.  Trim any bushes or trees on the path to your home.  Use bright outdoor lighting.  Clear any walking paths of anything that might make someone trip, such as rocks or tools.  Regularly check to see if handrails are loose or broken. Make sure that both sides of any steps have handrails.  Any raised decks and porches should have guardrails on the edges.  Have any leaves, snow, or ice cleared  regularly.  Use sand or salt on walking paths during winter.  Clean up any spills in your garage right away. This includes oil or grease spills. What can I do in the bathroom?  Use night lights.  Install grab bars by the toilet and in the tub and shower. Do not use towel bars as grab bars.  Use non-skid mats or decals in the tub or shower.  If you need to sit down in the shower, use a plastic, non-slip stool.  Keep the floor dry. Clean up any water that spills on the floor as soon as it happens.  Remove soap buildup in the tub or shower regularly.  Attach bath mats securely with double-sided non-slip rug tape.  Do not have throw rugs and other things on the floor that can make you trip. What can I do in the bedroom?  Use night lights.  Make sure that you have a light by your bed that is easy to reach.  Do not use any sheets or blankets that are too big for your bed. They should not hang down onto the floor.  Have a firm chair that has side  arms. You can use this for support while you get dressed.  Do not have throw rugs and other things on the floor that can make you trip. What can I do in the kitchen?  Clean up any spills right away.  Avoid walking on wet floors.  Keep items that you use a lot in easy-to-reach places.  If you need to reach something above you, use a strong step stool that has a grab bar.  Keep electrical cords out of the way.  Do not use floor polish or wax that makes floors slippery. If you must use wax, use non-skid floor wax.  Do not have throw rugs and other things on the floor that can make you trip. What can I do with my stairs?  Do not leave any items on the stairs.  Make sure that there are handrails on both sides of the stairs and use them. Fix handrails that are broken or loose. Make sure that handrails are as long as the stairways.  Check any carpeting to make sure that it is firmly attached to the stairs. Fix any carpet that is loose  or worn.  Avoid having throw rugs at the top or bottom of the stairs. If you do have throw rugs, attach them to the floor with carpet tape.  Make sure that you have a light switch at the top of the stairs and the bottom of the stairs. If you do not have them, ask someone to add them for you. What else can I do to help prevent falls?  Wear shoes that:  Do not have high heels.  Have rubber bottoms.  Are comfortable and fit you well.  Are closed at the toe. Do not wear sandals.  If you use a stepladder:  Make sure that it is fully opened. Do not climb a closed stepladder.  Make sure that both sides of the stepladder are locked into place.  Ask someone to hold it for you, if possible.  Clearly mark and make sure that you can see:  Any grab bars or handrails.  First and last steps.  Where the edge of each step is.  Use tools that help you move around (mobility aids) if they are needed. These include:  Canes.  Walkers.  Scooters.  Crutches.  Turn on the lights when you go into a dark area. Replace any light bulbs as soon as they burn out.  Set up your furniture so you have a clear path. Avoid moving your furniture around.  If any of your floors are uneven, fix them.  If there are any pets around you, be aware of where they are.  Review your medicines with your doctor. Some medicines can make you feel dizzy. This can increase your chance of falling. Ask your doctor what other things that you can do to help prevent falls. This information is not intended to replace advice given to you by your health care provider. Make sure you discuss any questions you have with your health care provider. Document Released: 03/06/2009 Document Revised: 10/16/2015 Document Reviewed: 06/14/2014 Elsevier Interactive Patient Education  2017 [employer].

## 2020-03-11 DIAGNOSIS — N2581 Secondary hyperparathyroidism of renal origin: Secondary | ICD-10-CM | POA: Diagnosis not present

## 2020-03-11 DIAGNOSIS — D631 Anemia in chronic kidney disease: Secondary | ICD-10-CM | POA: Diagnosis not present

## 2020-03-11 DIAGNOSIS — Z992 Dependence on renal dialysis: Secondary | ICD-10-CM | POA: Diagnosis not present

## 2020-03-11 DIAGNOSIS — D509 Iron deficiency anemia, unspecified: Secondary | ICD-10-CM | POA: Diagnosis not present

## 2020-03-11 DIAGNOSIS — N186 End stage renal disease: Secondary | ICD-10-CM | POA: Diagnosis not present

## 2020-03-12 DIAGNOSIS — N186 End stage renal disease: Secondary | ICD-10-CM | POA: Diagnosis not present

## 2020-03-12 DIAGNOSIS — N2581 Secondary hyperparathyroidism of renal origin: Secondary | ICD-10-CM | POA: Diagnosis not present

## 2020-03-12 DIAGNOSIS — D631 Anemia in chronic kidney disease: Secondary | ICD-10-CM | POA: Diagnosis not present

## 2020-03-12 DIAGNOSIS — Z992 Dependence on renal dialysis: Secondary | ICD-10-CM | POA: Diagnosis not present

## 2020-03-12 DIAGNOSIS — D509 Iron deficiency anemia, unspecified: Secondary | ICD-10-CM | POA: Diagnosis not present

## 2020-03-13 ENCOUNTER — Telehealth: Payer: Self-pay | Admitting: Nurse Practitioner

## 2020-03-13 DIAGNOSIS — D631 Anemia in chronic kidney disease: Secondary | ICD-10-CM | POA: Diagnosis not present

## 2020-03-13 DIAGNOSIS — N186 End stage renal disease: Secondary | ICD-10-CM | POA: Diagnosis not present

## 2020-03-13 DIAGNOSIS — N2581 Secondary hyperparathyroidism of renal origin: Secondary | ICD-10-CM | POA: Diagnosis not present

## 2020-03-13 DIAGNOSIS — Z992 Dependence on renal dialysis: Secondary | ICD-10-CM | POA: Diagnosis not present

## 2020-03-13 DIAGNOSIS — D509 Iron deficiency anemia, unspecified: Secondary | ICD-10-CM | POA: Diagnosis not present

## 2020-03-13 NOTE — Telephone Encounter (Signed)
Attempted to call the patient. No answer- I left a message to please call back.  

## 2020-03-13 NOTE — Telephone Encounter (Signed)
Theora Gianotti, NP  03/13/2020 12:40 PM EDT     Predominantly a normal rhythm with occas extra beats from the top and bottom chambers. Monitor was placed to assess for afib in the setting of recent stroke. No afib found. Cont current dose of carevedilol and we can reassess potential need for titration at f/u visit in Nov.

## 2020-03-13 NOTE — Progress Notes (Signed)
Subjective:    Patient ID: [name], male    DOB: [birth year], 74 y.o.   MRN: [id number] Chief Complaint  Patient presents with  . Follow-up    pt conv abi    [name] is a 74 year old male that returns today for noninvasive studies regarding lower extremity leg pain.  The patient notes that he can only walk approximately 50 to 74 feet without having to rest.  He begins to have radiating pain down his thighs and calves.  Both legs are affected.  Initially he began to have only pain in the right leg but now it is in the left as well.  He also has some chronic numbness due to previous TIA and stroke.  The patient notes that he had an MRI of his spine approximately a year ago that did not have any significant issues.  Today noninvasive studies show an ABI of 1.08 on the right and 1.00 on the left.  The patient has a TBI of 0.95 on the right and 0.70 on the left.  The patient has triphasic tibial artery waveforms bilaterally with good toe waveforms bilaterally.    Review of Systems  Musculoskeletal: Positive for gait problem and myalgias.  All other systems reviewed and are negative.      Objective:   Physical Exam Vitals reviewed.  HENT:     Head: Normocephalic.  Cardiovascular:     Rate and Rhythm: Normal rate.     Pulses: Normal pulses.  Pulmonary:     Effort: Pulmonary effort is normal.  Skin:    General: Skin is warm and dry.  Neurological:     Mental Status: He is alert and oriented to person, place, and time.     Gait: Gait abnormal.  Psychiatric:        Mood and Affect: Mood normal.        Behavior: Behavior normal.        Thought Content: Thought content normal.        Judgment: Judgment normal.     BP (!) 149/65   Pulse 68   Ht 5\' 6"  (1.676 m)   Wt 142 lb (64.4 kg)   BMI 22.92 kg/m   Past Medical History:  Diagnosis Date  . 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 Cardiomyopathy ([place])    a. 12/2018 Echo: EF  40-45%, global HK. Nl RV fxn. Mild-mod LAE. Mild AI, Mild-mod MR. Asc Ao 3.7cm; b. 01/2019 Lexi MV: small, mild, fixed basal and mid antlat defect - scar vs artifact. Small, mild mid and apical inf minimally reversible defect, likely scar w/ peri-infarct ischemia. Coronary and Ao atherosclerosis.  . Complication of anesthesia    had to be woken up slowly as his bp was elevated when did this quickly  . Diabetes mellitus without complication ([place])   . ESRD (end stage renal disease) ([place])    a. Peritoneal Dialysis pt.  . Hyperlipidemia   . Hypertension   . Migraine    cluster  . Neuromuscular disorder ([place])    left lower extrem neuropathy  . Obstructive sleep apnea    no OSA since had facial surgery with dr. Kathyrn Sheriff in 1997  . PAD (peripheral artery disease) [facility]) Feb 2009   nonobstructing, renal angiogram ([place])  . Renal cell carcinoma 2004   left kidney heminephrectomy  . Renal insufficiency   . TIA (transient ischemic attack)    no residual but left leg and  foot still feel heavy  . tobacco abuse     Social History   Socioeconomic History  . Marital status: Married    Spouse name: [name]  . Number of children: Not on file  . Years of education: Not on file  . Highest education level: Not on file  Occupational History  . 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 Smoking status: Current Every Day Smoker    Packs/day: 1.00    Years: 50.00    Pack years: 50.00    Types: Cigarettes  . Smokeless tobacco: Never Used  Vaping Use  . Vaping Use: Former  . Devices: tried but did not like  Substance and Sexual Activity  . Alcohol use: Not Currently  . Drug use: No  . Sexual activity: Yes  Other Topics Concern  . Not on file  Social History Narrative  . Not on file   Social Determinants of Health   Financial Resource Strain:   . Difficulty of Paying Living Expenses: Not on file  Food Insecurity: No Food Insecurity  . Worried About Charity fundraiser in  the Last Year: Never true  . Ran Out of Food in the Last Year: Never true  Transportation Needs: No Transportation Needs  . Lack of Transportation (Medical): No  . Lack of Transportation (Non-Medical): No  Physical Activity:   . Days of Exercise per Week: Not on file  . Minutes of Exercise per Session: Not on file  Stress: No Stress Concern Present  . Feeling of Stress : Only a little  Social Connections:   . Frequency of Communication with Friends and Family: Not on file  . Frequency of Social Gatherings with Friends and Family: Not on file  . Attends Religious Services: Not on file  . Active Member of Clubs or Organizations: Not on file  . Attends Archivist Meetings: Not on file  . Marital Status: Not on file  Intimate Partner Violence:   . Fear of Current or Ex-Partner: Not on file  . Emotionally Abused: Not on file  . Physically Abused: Not on file  . Sexually Abused: Not on file    Past Surgical History:  Procedure Laterality Date  . CAPD INSERTION N/A 03/01/2019   Procedure: LAPAROSCOPIC INSERTION CONTINUOUS AMBULATORY PERITONEAL DIALYSIS  (CAPD) CATHETER;  Surgeon: Algernon Huxley, MD;  Location: [facility] ORS;  Service: Vascular;  Laterality: N/A;  . CARDIAC CATHETERIZATION     Dr. [name] did this to assess his renal artery  . cyst removal  12/25/2015   Spine L4 and L5  . heminephrectomy  2004   for renal cell CA  . RENAL CRYOABLATION  Jan 2006   right kidney,  [name]  . sciatica      Family History  Problem Relation Age of Onset  . Hypertension Mother   . Cancer Mother        breast  . Aneurysm Mother   . Coronary artery disease Father   . Hypertension Father   . Stroke Father 80  . Heart disease Father   . Heart attack Father 55  . Aneurysm Maternal Grandmother        brain  . Aneurysm Paternal Grandmother        brain  . Coronary artery disease Paternal Grandfather   . Heart disease Brother        valvular heart disease  . COPD Brother   .  Hypertension Brother   . Stroke Paternal  Uncle     Allergies  Allergen Reactions  . Irbesartan Other (See Comments)    hyperkalemia  . Cymbalta [Duloxetine Hcl]   . Atorvastatin Other (See Comments)    Muscle pain  . Bystolic [Nebivolol Hcl]     Extreme fatigue        Assessment & Plan:   1. Dissection of left subclavian artery (HCC) Continues to be asymptomatic.  Recently had studies for evaluation.  We will continue with annual follow-up.  We will see the patient in office after the studies.  Patient will continue with current medical regimen  2. Type 2 diabetes mellitus with chronic kidney disease on chronic dialysis, without long-term current use of insulin (HCC) Continue hypoglycemic medications as already ordered, these medications have been reviewed and there are no changes at this time.  Hgb A1C to be monitored as already arranged by primary service   3. Pain in both lower extremities Recommend:  I do not find evidence of Vascular pathology that would explain the patient's symptoms  The patient has atypical pain symptoms for vascular disease  I do not find evidence of Vascular pathology that would explain the patient's symptoms and I suspect the patient is c/o pseudoclaudication.  Patient should have an evaluation of his LS spine which I defer to the primary service.  Noninvasive studies of the legs do not identify vascular problems  The patient should continue walking and begin a more formal exercise program. The patient should continue his antiplatelet therapy and aggressive treatment of the lipid abnormalities. The patient should begin wearing graduated compression socks 15-20 mmHg strength to control her mild edema.  Patient will follow-up with me on a PRN basis  Further work-up of her lower extremity pain is deferred to the primary service      Current Outpatient Medications on File Prior to Visit  Medication Sig Dispense Refill  . albuterol (VENTOLIN  HFA) 108 (90 Base) MCG/ACT inhaler Inhale 2 puffs into the lungs every 6 (six) hours as needed for wheezing or shortness of breath. 18 g 0  . amLODipine (NORVASC) 10 MG tablet TAKE 1 TABLET BY MOUTH ONCE DAILY 90 tablet 1  . aspirin 81 MG tablet Take 1 tablet (81 mg total) by mouth daily. 30 tablet 11  . calcitRIOL (ROCALTROL) 0.25 MCG capsule Take 0.25 mcg by mouth daily. Take one capsule only on Monday / Wednesday / FRIDAY  3  . carvedilol (COREG) 6.25 MG tablet Take 6.25 mg by mouth 2 (two) times daily with a meal.   6  . cloNIDine (CATAPRES - DOSED IN MG/24 HR) 0.3 mg/24hr patch Place 2 patches (0.6 mg total) onto the skin once a week. 4 patch 0  . cloNIDine (CATAPRES) 0.2 MG tablet Take 0.2 mg by mouth 3 (three) times daily.    . clopidogrel (PLAVIX) 75 MG tablet Take 1 tablet (75 mg total) by mouth daily. 21 tablet 0  . diphenhydrAMINE (BENADRYL) 25 MG tablet Take 25 mg by mouth every 6 (six) hours as needed.     . Dutasteride-Tamsulosin HCl 0.5-0.4 MG CAPS Take 1 capsule by mouth daily. 30 capsule 5  . fluticasone (FLONASE) 50 MCG/ACT nasal spray Place 2 sprays into both nostrils daily. 16 g 6  . gabapentin (NEURONTIN) 100 MG capsule TAKE 1 CAPSULE BY MOUTH 3 TIMES DAILY 90 capsule 1  . gentamicin cream (GARAMYCIN) 0.1 % Apply 1 application topically daily. 15 g 0  . losartan (COZAAR) 100 MG tablet Take 100 mg by  mouth daily.    . multivitamin (RENA-VIT) TABS tablet Take 1 tablet by mouth at bedtime.    . nicotine (NICODERM CQ - DOSED IN MG/24 HOURS) 14 mg/24hr patch 14 mg patch daily to chest wall, okay to substitute generic 28 patch 0  . rosuvastatin (CRESTOR) 20 MG tablet Take 1 tablet (20 mg total) by mouth at bedtime. 30 tablet 0  . sodium bicarbonate 325 MG tablet TK 1 T PO BID  1  . ezetimibe (ZETIA) 10 MG tablet Take 1 tablet (10 mg total) by mouth daily. 90 tablet 3   Current Facility-Administered Medications on File Prior to Visit  Medication Dose Route Frequency Provider Last  Rate Last Admin  . ipratropium-albuterol (DUONEB) 0.5-2.5 (3) MG/3ML nebulizer solution 3 mL  3 mL Nebulization Q6H Crecencio Mc, MD        There are no Patient Instructions on file for this visit. No follow-ups on file.   Kris Hartmann, NP

## 2020-03-14 DIAGNOSIS — N2581 Secondary hyperparathyroidism of renal origin: Secondary | ICD-10-CM | POA: Diagnosis not present

## 2020-03-14 DIAGNOSIS — N186 End stage renal disease: Secondary | ICD-10-CM | POA: Diagnosis not present

## 2020-03-14 DIAGNOSIS — D509 Iron deficiency anemia, unspecified: Secondary | ICD-10-CM | POA: Diagnosis not present

## 2020-03-14 DIAGNOSIS — Z992 Dependence on renal dialysis: Secondary | ICD-10-CM | POA: Diagnosis not present

## 2020-03-14 DIAGNOSIS — D631 Anemia in chronic kidney disease: Secondary | ICD-10-CM | POA: Diagnosis not present

## 2020-03-15 DIAGNOSIS — D509 Iron deficiency anemia, unspecified: Secondary | ICD-10-CM | POA: Diagnosis not present

## 2020-03-15 DIAGNOSIS — D631 Anemia in chronic kidney disease: Secondary | ICD-10-CM | POA: Diagnosis not present

## 2020-03-15 DIAGNOSIS — N2581 Secondary hyperparathyroidism of renal origin: Secondary | ICD-10-CM | POA: Diagnosis not present

## 2020-03-15 DIAGNOSIS — Z992 Dependence on renal dialysis: Secondary | ICD-10-CM | POA: Diagnosis not present

## 2020-03-15 DIAGNOSIS — N186 End stage renal disease: Secondary | ICD-10-CM | POA: Diagnosis not present

## 2020-03-16 DIAGNOSIS — D509 Iron deficiency anemia, unspecified: Secondary | ICD-10-CM | POA: Diagnosis not present

## 2020-03-16 DIAGNOSIS — Z992 Dependence on renal dialysis: Secondary | ICD-10-CM | POA: Diagnosis not present

## 2020-03-16 DIAGNOSIS — D631 Anemia in chronic kidney disease: Secondary | ICD-10-CM | POA: Diagnosis not present

## 2020-03-16 DIAGNOSIS — N186 End stage renal disease: Secondary | ICD-10-CM | POA: Diagnosis not present

## 2020-03-16 DIAGNOSIS — N2581 Secondary hyperparathyroidism of renal origin: Secondary | ICD-10-CM | POA: Diagnosis not present

## 2020-03-17 DIAGNOSIS — N2581 Secondary hyperparathyroidism of renal origin: Secondary | ICD-10-CM | POA: Diagnosis not present

## 2020-03-17 DIAGNOSIS — D509 Iron deficiency anemia, unspecified: Secondary | ICD-10-CM | POA: Diagnosis not present

## 2020-03-17 DIAGNOSIS — Z992 Dependence on renal dialysis: Secondary | ICD-10-CM | POA: Diagnosis not present

## 2020-03-17 DIAGNOSIS — D631 Anemia in chronic kidney disease: Secondary | ICD-10-CM | POA: Diagnosis not present

## 2020-03-17 DIAGNOSIS — N186 End stage renal disease: Secondary | ICD-10-CM | POA: Diagnosis not present

## 2020-03-18 DIAGNOSIS — N186 End stage renal disease: Secondary | ICD-10-CM | POA: Diagnosis not present

## 2020-03-18 DIAGNOSIS — Z992 Dependence on renal dialysis: Secondary | ICD-10-CM | POA: Diagnosis not present

## 2020-03-18 DIAGNOSIS — N2581 Secondary hyperparathyroidism of renal origin: Secondary | ICD-10-CM | POA: Diagnosis not present

## 2020-03-18 DIAGNOSIS — D509 Iron deficiency anemia, unspecified: Secondary | ICD-10-CM | POA: Diagnosis not present

## 2020-03-18 DIAGNOSIS — D631 Anemia in chronic kidney disease: Secondary | ICD-10-CM | POA: Diagnosis not present

## 2020-03-19 DIAGNOSIS — N2581 Secondary hyperparathyroidism of renal origin: Secondary | ICD-10-CM | POA: Diagnosis not present

## 2020-03-19 DIAGNOSIS — D509 Iron deficiency anemia, unspecified: Secondary | ICD-10-CM | POA: Diagnosis not present

## 2020-03-19 DIAGNOSIS — Z992 Dependence on renal dialysis: Secondary | ICD-10-CM | POA: Diagnosis not present

## 2020-03-19 DIAGNOSIS — D631 Anemia in chronic kidney disease: Secondary | ICD-10-CM | POA: Diagnosis not present

## 2020-03-19 DIAGNOSIS — N186 End stage renal disease: Secondary | ICD-10-CM | POA: Diagnosis not present

## 2020-03-20 DIAGNOSIS — D631 Anemia in chronic kidney disease: Secondary | ICD-10-CM | POA: Diagnosis not present

## 2020-03-20 DIAGNOSIS — N186 End stage renal disease: Secondary | ICD-10-CM | POA: Diagnosis not present

## 2020-03-20 DIAGNOSIS — Z992 Dependence on renal dialysis: Secondary | ICD-10-CM | POA: Diagnosis not present

## 2020-03-20 DIAGNOSIS — D509 Iron deficiency anemia, unspecified: Secondary | ICD-10-CM | POA: Diagnosis not present

## 2020-03-20 DIAGNOSIS — N2581 Secondary hyperparathyroidism of renal origin: Secondary | ICD-10-CM | POA: Diagnosis not present

## 2020-03-20 NOTE — Telephone Encounter (Signed)
Spoke with patient. Reviewed results and recommendations with patient. Also confirmed upcoming appointment with provider and he verbalized understanding with no further questions at this time.

## 2020-03-21 DIAGNOSIS — N186 End stage renal disease: Secondary | ICD-10-CM | POA: Diagnosis not present

## 2020-03-21 DIAGNOSIS — Z992 Dependence on renal dialysis: Secondary | ICD-10-CM | POA: Diagnosis not present

## 2020-03-22 DIAGNOSIS — N186 End stage renal disease: Secondary | ICD-10-CM | POA: Diagnosis not present

## 2020-03-22 DIAGNOSIS — Z992 Dependence on renal dialysis: Secondary | ICD-10-CM | POA: Diagnosis not present

## 2020-03-23 DIAGNOSIS — Z992 Dependence on renal dialysis: Secondary | ICD-10-CM | POA: Diagnosis not present

## 2020-03-23 DIAGNOSIS — N186 End stage renal disease: Secondary | ICD-10-CM | POA: Diagnosis not present

## 2020-03-24 DIAGNOSIS — Z992 Dependence on renal dialysis: Secondary | ICD-10-CM | POA: Diagnosis not present

## 2020-03-24 DIAGNOSIS — N186 End stage renal disease: Secondary | ICD-10-CM | POA: Diagnosis not present

## 2020-03-24 DIAGNOSIS — N2581 Secondary hyperparathyroidism of renal origin: Secondary | ICD-10-CM | POA: Diagnosis not present

## 2020-03-24 DIAGNOSIS — D509 Iron deficiency anemia, unspecified: Secondary | ICD-10-CM | POA: Diagnosis not present

## 2020-03-25 DIAGNOSIS — D509 Iron deficiency anemia, unspecified: Secondary | ICD-10-CM | POA: Diagnosis not present

## 2020-03-25 DIAGNOSIS — N2581 Secondary hyperparathyroidism of renal origin: Secondary | ICD-10-CM | POA: Diagnosis not present

## 2020-03-25 DIAGNOSIS — N186 End stage renal disease: Secondary | ICD-10-CM | POA: Diagnosis not present

## 2020-03-25 DIAGNOSIS — Z992 Dependence on renal dialysis: Secondary | ICD-10-CM | POA: Diagnosis not present

## 2020-03-26 DIAGNOSIS — D509 Iron deficiency anemia, unspecified: Secondary | ICD-10-CM | POA: Diagnosis not present

## 2020-03-26 DIAGNOSIS — N2581 Secondary hyperparathyroidism of renal origin: Secondary | ICD-10-CM | POA: Diagnosis not present

## 2020-03-26 DIAGNOSIS — N186 End stage renal disease: Secondary | ICD-10-CM | POA: Diagnosis not present

## 2020-03-26 DIAGNOSIS — Z992 Dependence on renal dialysis: Secondary | ICD-10-CM | POA: Diagnosis not present

## 2020-03-27 DIAGNOSIS — N186 End stage renal disease: Secondary | ICD-10-CM | POA: Diagnosis not present

## 2020-03-27 DIAGNOSIS — D509 Iron deficiency anemia, unspecified: Secondary | ICD-10-CM | POA: Diagnosis not present

## 2020-03-27 DIAGNOSIS — Z992 Dependence on renal dialysis: Secondary | ICD-10-CM | POA: Diagnosis not present

## 2020-03-27 DIAGNOSIS — N2581 Secondary hyperparathyroidism of renal origin: Secondary | ICD-10-CM | POA: Diagnosis not present

## 2020-03-28 DIAGNOSIS — D509 Iron deficiency anemia, unspecified: Secondary | ICD-10-CM | POA: Diagnosis not present

## 2020-03-28 DIAGNOSIS — Z992 Dependence on renal dialysis: Secondary | ICD-10-CM | POA: Diagnosis not present

## 2020-03-28 DIAGNOSIS — N2581 Secondary hyperparathyroidism of renal origin: Secondary | ICD-10-CM | POA: Diagnosis not present

## 2020-03-28 DIAGNOSIS — N186 End stage renal disease: Secondary | ICD-10-CM | POA: Diagnosis not present

## 2020-03-29 DIAGNOSIS — N2581 Secondary hyperparathyroidism of renal origin: Secondary | ICD-10-CM | POA: Diagnosis not present

## 2020-03-29 DIAGNOSIS — Z992 Dependence on renal dialysis: Secondary | ICD-10-CM | POA: Diagnosis not present

## 2020-03-29 DIAGNOSIS — D509 Iron deficiency anemia, unspecified: Secondary | ICD-10-CM | POA: Diagnosis not present

## 2020-03-29 DIAGNOSIS — N186 End stage renal disease: Secondary | ICD-10-CM | POA: Diagnosis not present

## 2020-03-30 DIAGNOSIS — Z992 Dependence on renal dialysis: Secondary | ICD-10-CM | POA: Diagnosis not present

## 2020-03-30 DIAGNOSIS — N2581 Secondary hyperparathyroidism of renal origin: Secondary | ICD-10-CM | POA: Diagnosis not present

## 2020-03-30 DIAGNOSIS — N186 End stage renal disease: Secondary | ICD-10-CM | POA: Diagnosis not present

## 2020-03-30 DIAGNOSIS — D509 Iron deficiency anemia, unspecified: Secondary | ICD-10-CM | POA: Diagnosis not present

## 2020-03-31 DIAGNOSIS — D509 Iron deficiency anemia, unspecified: Secondary | ICD-10-CM | POA: Diagnosis not present

## 2020-03-31 DIAGNOSIS — N2581 Secondary hyperparathyroidism of renal origin: Secondary | ICD-10-CM | POA: Diagnosis not present

## 2020-03-31 DIAGNOSIS — Z992 Dependence on renal dialysis: Secondary | ICD-10-CM | POA: Diagnosis not present

## 2020-03-31 DIAGNOSIS — N186 End stage renal disease: Secondary | ICD-10-CM | POA: Diagnosis not present

## 2020-04-01 DIAGNOSIS — N2581 Secondary hyperparathyroidism of renal origin: Secondary | ICD-10-CM | POA: Diagnosis not present

## 2020-04-01 DIAGNOSIS — Z992 Dependence on renal dialysis: Secondary | ICD-10-CM | POA: Diagnosis not present

## 2020-04-01 DIAGNOSIS — D509 Iron deficiency anemia, unspecified: Secondary | ICD-10-CM | POA: Diagnosis not present

## 2020-04-01 DIAGNOSIS — N186 End stage renal disease: Secondary | ICD-10-CM | POA: Diagnosis not present

## 2020-04-02 ENCOUNTER — Ambulatory Visit (INDEPENDENT_AMBULATORY_CARE_PROVIDER_SITE_OTHER): Payer: Medicare Other | Admitting: Nurse Practitioner

## 2020-04-02 ENCOUNTER — Other Ambulatory Visit: Payer: Self-pay

## 2020-04-02 ENCOUNTER — Encounter: Payer: Self-pay | Admitting: Nurse Practitioner

## 2020-04-02 VITALS — BP 160/90 | HR 74 | Ht 66.0 in | Wt 148.0 lb

## 2020-04-02 DIAGNOSIS — D509 Iron deficiency anemia, unspecified: Secondary | ICD-10-CM | POA: Diagnosis not present

## 2020-04-02 DIAGNOSIS — I6310 Cerebral infarction due to embolism of unspecified precerebral artery: Secondary | ICD-10-CM | POA: Diagnosis not present

## 2020-04-02 DIAGNOSIS — I429 Cardiomyopathy, unspecified: Secondary | ICD-10-CM | POA: Diagnosis not present

## 2020-04-02 DIAGNOSIS — I739 Peripheral vascular disease, unspecified: Secondary | ICD-10-CM | POA: Diagnosis not present

## 2020-04-02 DIAGNOSIS — I639 Cerebral infarction, unspecified: Secondary | ICD-10-CM

## 2020-04-02 DIAGNOSIS — Z992 Dependence on renal dialysis: Secondary | ICD-10-CM | POA: Diagnosis not present

## 2020-04-02 DIAGNOSIS — I10 Essential (primary) hypertension: Secondary | ICD-10-CM | POA: Diagnosis not present

## 2020-04-02 DIAGNOSIS — N186 End stage renal disease: Secondary | ICD-10-CM | POA: Diagnosis not present

## 2020-04-02 DIAGNOSIS — N2581 Secondary hyperparathyroidism of renal origin: Secondary | ICD-10-CM | POA: Diagnosis not present

## 2020-04-02 DIAGNOSIS — E782 Mixed hyperlipidemia: Secondary | ICD-10-CM | POA: Diagnosis not present

## 2020-04-02 MED ORDER — CLOPIDOGREL BISULFATE 75 MG PO TABS
75.0000 mg | ORAL_TABLET | Freq: Every day | ORAL | 1 refills | Status: DC
Start: 2020-04-02 — End: 2020-11-07

## 2020-04-02 MED ORDER — ISOSORBIDE MONONITRATE ER 30 MG PO TB24: 30.0000 mg | ORAL_TABLET | Freq: Every day | ORAL | 1 refills | Status: DC

## 2020-04-02 MED ORDER — ROSUVASTATIN CALCIUM 20 MG PO TABS: 20.0000 mg | ORAL_TABLET | Freq: Every evening | ORAL | 1 refills | Status: DC

## 2020-04-02 NOTE — Patient Instructions (Signed)
Medication Instructions:  Your physician has recommended you make the following change in your medication:   START Imdur 30 mg daily. An Rx has been sent to your pharmacy.  Plavix and Crestor have been refilled.  *If you need a refill on your cardiac medications before your next appointment, please call your pharmacy*   Lab Work: None ordered  If you have labs (blood work) drawn today and your tests are completely normal, you will receive your results only by: Marland Kitchen MyChart Message (if you have MyChart) OR . A paper copy in the mail If you have any lab test that is abnormal or we need to change your treatment, we will call you to review the results.   Testing/Procedures: None ordered   Follow-Up: At [facility], you and your health needs are our priority.  As part of our continuing mission to provide you with exceptional heart care, we have created designated Provider Care Teams.  These Care Teams include your primary Cardiologist (physician) and Advanced Practice Providers (APPs -  Physician Assistants and Nurse Practitioners) who all work together to provide you with the care you need, when you need it.  We recommend signing up for the patient portal called "MyChart".  Sign up information is provided on this After Visit Summary.  MyChart is used to connect with patients for Virtual Visits (Telemedicine).  Patients are able to view lab/test results, encounter notes, upcoming appointments, etc.  Non-urgent messages can be sent to your provider as well.   To learn more about what you can do with MyChart, go to [email or web address].    Your next appointment:   3 month(s)  The format for your next appointment:   In Person  Provider:   [name], MD   Other Instructions N/A

## 2020-04-02 NOTE — Progress Notes (Signed)
Office Visit    Patient Name: [name] Date of Encounter: 04/02/2020  Primary Care Provider:  Crecencio Mc, MD Primary Cardiologist:  [name], MD  Chief Complaint    74 year old male with a history of end-stage renal disease on peritoneal dialysis, cardiomyopathy with an EF of 40-45%, prior abnormal stress test, hypertension, hyperlipidemia, peripheral arterial disease, sleep apnea, renal cell carcinoma status post left heminephrectomy, tobacco abuse, stroke, and adenocarcinoma of the appendix, who presents for follow-up related to DOE and fatigue.  Past Medical History    Past Medical History:  Diagnosis Date  . 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 Cardiomyopathy ([place])    a. 12/2018 Echo: EF 40-45%, global HK. Asc Ao 3.7cm; b. 01/2019 Lexi MV: small, mild, fixed basal and mid antlat defect - scar vs artifact. Small, mild mid and apical inf minimally reversible defect, likely scar w/ peri-infarct ischemia. Coronary and Ao atherosclerosis; c. 12/2019 Echo: EF 40-45%, glob HK, Gr1 DD. Nl RV fxn. Sev dil LA. Mild MR. Mild-mod Ao sclerosis w/o stenosis. Asc Ao 55mm.  . Carotid arterial disease ([place])    a. 01/2020 RICA 1-39%, RCCA/RECA < 65%, LICA 7-84%, LCCA/LECA <50%.  . Claudication ([place])    a. 02/2020 ABI/TBI: R 1.08/0.95, L 1.00/0.70.  Marland Kitchen Complication of anesthesia    had to be woken up slowly as his bp was elevated when did this quickly  . Diabetes mellitus without complication ([place])   . ESRD (end stage renal disease) ([place])    a. Peritoneal Dialysis pt.  . Hyperlipidemia   . Hypertension   . Migraine    cluster  . Neuromuscular disorder ([place])    left lower extrem neuropathy  . Obstructive sleep apnea    no OSA since had facial surgery with dr. Kathyrn Sheriff in 1997  . PAD (peripheral artery disease) [facility]) Feb 2009   nonobstructing, renal angiogram ([place])  . Renal cell carcinoma 2004   left kidney heminephrectomy  . Renal  insufficiency   . Stroke [facility])    a. 12/2019 MRI: Small acute infarcts involving the left cerebral hemisphere and several chronic infarcts.  Marland Kitchen TIA (transient ischemic attack)    no residual but left leg and foot still feel heavy  . tobacco abuse    Past Surgical History:  Procedure Laterality Date  . CAPD INSERTION N/A 03/01/2019   Procedure: LAPAROSCOPIC INSERTION CONTINUOUS AMBULATORY PERITONEAL DIALYSIS  (CAPD) CATHETER;  Surgeon: Algernon Huxley, MD;  Location: [facility] ORS;  Service: Vascular;  Laterality: N/A;  . CARDIAC CATHETERIZATION     Dr. [name] did this to assess his renal artery  . cyst removal  12/25/2015   Spine L4 and L5  . heminephrectomy  2004   for renal cell CA  . RENAL CRYOABLATION  Jan 2006   right kidney,  [name]  . sciatica      Allergies  Allergies  Allergen Reactions  . Irbesartan Other (See Comments)    hyperkalemia  . Cymbalta [Duloxetine Hcl]   . Atorvastatin Other (See Comments)    Muscle pain  . Bystolic [Nebivolol Hcl]     Extreme fatigue     History of Present Illness    74 year old male with the above complex past medical history including end-stage renal disease on peritoneal dialysis, cardiomyopathy with an EF of 40-45%, hypertension, hyperlipidemia, peripheral arterial disease, sleep apnea, renal cell carcinoma status post left heminephrectomy, and carcinoid of the appendix.  He was  previous evaluated in August 2020 for preoperative evaluation pending peritoneal abscess catheter placement.  In the setting of chronic dyspnea, echocardiogram was performed and showed an EF of 40-45% with global hypokinesis, mild AI, mild to moderate MR, and mild dilation of the ascending aorta at 3.7 cm.  This was followed by stress testing which showed a small, mild, fixed basal and mid anterolateral defect as well as a small, mild, mid and apical inferior minimally reversible defect, which was felt to be scar with peri-infarct ischemia.  The patient was not having any  symptoms and with renal failure with recent initiation of peritoneal dialysis, decision was made to continue medical therapy.  He was seen in the emergency department on August 24 with complaints of a 2-day history of lack of coordination of his right hand and arm followed by numbness along his right neck to his right ear and also right flank numbness.  Head CT showed chronic microvascular disease without acute intracranial findings.  MRI however, showed small acute infarcts involving the left cerebral hemisphere (MCA and PCA territories, as well as posterior right temporal lobe).  Several chronic infarcts were also noted.  CT angio of the head and neck showed extensive atherosclerotic disease involving the aorta, carotids, and vertebrals.  Short segment dissection of the proximal left subclavian was also noted without stenosis.  Echocardiogram was performed and was similar to prior echo with an EF of 40-45%, moderate LVH, global hypokinesis, grade 1 diastolic dysfunction.  Bubble study was negative.  It was felt that his neurologic event was secondary to an atheroembolic source in the setting of extensive aortic and carotid atherosclerotic disease.  He was seen by our team and continuation of dual antiplatelet therapy and statin was recommended.  He was also seen by vascular surgery who did not feel that intervention was required for carotid disease or left subclavian artery dissection.  He has since followed with vascular surgery outpatient setting with recommendation for annual follow-up.  He did wear a 14-day ZIO monitor following ED discharge this showed occasional PACs and PVCs but no evidence of atrial fibrillation.  Since his hospitalization he has noted an increase in DOE and fatigue.  He does not experience chest pain.  He also has bilateral lower extremity claudication that he describes as tightening of his bilateral calves and thighs after walking about 50 to 75 yards.  Symptoms improved with rest.   This was recently evaluated with ABIs by vascular surgery, which were normal.  He denies palpitations, PND, orthopnea, dizziness, syncope, edema, or early satiety.  Home Medications    Prior to Admission medications   Medication Sig Start Date End Date Taking? Authorizing Provider  albuterol (VENTOLIN HFA) 108 (90 Base) MCG/ACT inhaler Inhale 2 puffs into the lungs every 6 (six) hours as needed for wheezing or shortness of breath. 04/12/19   Crecencio Mc, MD  amLODipine (NORVASC) 10 MG tablet TAKE 1 TABLET BY MOUTH ONCE DAILY 06/04/19   Crecencio Mc, MD  aspirin 81 MG tablet Take 1 tablet (81 mg total) by mouth daily. 05/16/14   Crecencio Mc, MD  calcitRIOL (ROCALTROL) 0.25 MCG capsule Take 0.25 mcg by mouth daily. Take one capsule only on Monday / Wednesday / FRIDAY 03/04/18   [provider]  carvedilol (COREG) 6.25 MG tablet Take 6.25 mg by mouth 2 (two) times daily with a meal.  07/07/17   [provider]  cloNIDine (CATAPRES - DOSED IN MG/24 HR) 0.3 mg/24hr patch Place 2  patches (0.6 mg total) onto the skin once a week. 01/21/20   Loletha Grayer, MD  cloNIDine (CATAPRES) 0.2 MG tablet Take 0.2 mg by mouth 3 (three) times daily. 03/06/20   [provider]  clopidogrel (PLAVIX) 75 MG tablet Take 1 tablet (75 mg total) by mouth daily. 01/16/20   Loletha Grayer, MD  diphenhydrAMINE (BENADRYL) 25 MG tablet Take 25 mg by mouth every 6 (six) hours as needed.     [provider]  Dutasteride-Tamsulosin HCl 0.5-0.4 MG CAPS Take 1 capsule by mouth daily. 01/21/20   Crecencio Mc, MD  ezetimibe (ZETIA) 10 MG tablet Take 1 tablet (10 mg total) by mouth daily. 05/09/19 01/14/20  Theora Gianotti, NP  fluticasone (FLONASE) 50 MCG/ACT nasal spray Place 2 sprays into both nostrils daily. 05/29/18   Crecencio Mc, MD  gabapentin (NEURONTIN) 100 MG capsule TAKE 1 CAPSULE BY MOUTH 3 TIMES DAILY 10/15/19   Crecencio Mc, MD  gentamicin cream (GARAMYCIN) 0.1  % Apply 1 application topically daily. 01/15/20   Loletha Grayer, MD  losartan (COZAAR) 100 MG tablet Take 100 mg by mouth daily. 04/11/19   [provider]  multivitamin (RENA-VIT) TABS tablet Take 1 tablet by mouth at bedtime. 12/21/19   [provider]  nicotine (NICODERM CQ - DOSED IN MG/24 HOURS) 14 mg/24hr patch 14 mg patch daily to chest wall, okay to substitute generic 01/15/20   Loletha Grayer, MD  rosuvastatin (CRESTOR) 20 MG tablet Take 1 tablet (20 mg total) by mouth at bedtime. 01/15/20 01/14/21  Loletha Grayer, MD  sodium bicarbonate 325 MG tablet TK 1 T PO BID 02/14/18   [provider]    Review of Systems    He denies chest pain but does experience dyspnea on exertion and easy fatigability.  Bilateral lower extremity claudication as well.  He denies palpitations, PND, orthopnea, dizziness, syncope, edema, or early satiety.  All other systems reviewed and are otherwise negative except as noted above.  Physical Exam    VS:  BP (!) 160/90 (BP Location: Left Arm, Patient Position: Sitting, Cuff Size: Normal)   Pulse 74   Ht 5\' 6"  (1.676 m)   Wt 148 lb (67.1 kg)   SpO2 98%   BMI 23.89 kg/m  , BMI Body mass index is 23.89 kg/m. GEN: Well nourished, well developed, in no acute distress. HEENT: normal. Neck: Supple, no JVD, carotid bruits, or masses. Cardiac: RRR, no murmurs, rubs, or gallops. No clubbing, cyanosis, edema.  Radials/PT 1+ and equal bilaterally.  Respiratory:  Respirations regular and unlabored, diminished breath sounds throughout bilaterally. GI: Soft, nontender, nondistended, BS + x 4. MS: no deformity or atrophy. Skin: warm and dry, no rash. Neuro:  Strength and sensation are intact. Psych: Normal affect.  Accessory Clinical Findings    Refused ECG  Lab Results  Component Value Date   WBC 12.2 (H) 01/14/2020   HGB 11.6 (L) 01/14/2020   HCT 32.6 (L) 01/14/2020   MCV 85.1 01/14/2020   PLT 215 01/14/2020   Lab Results    Component Value Date   CREATININE 7.60 (H) 01/14/2020   BUN 62 (H) 01/14/2020   NA 139 01/14/2020   K 3.7 01/14/2020   CL 102 01/14/2020   CO2 23 01/14/2020   Lab Results  Component Value Date   ALT 10 01/14/2020   AST 9 (L) 01/14/2020   ALKPHOS 101 01/14/2020   BILITOT 0.9 01/14/2020   Lab Results  Component Value Date  CHOL 180 01/15/2020   HDL 23 (L) 01/15/2020   LDLCALC 111 (H) 01/15/2020   LDLDIRECT 134.0 05/11/2017   TRIG 232 (H) 01/15/2020   CHOLHDL 7.8 01/15/2020    Lab Results  Component Value Date   HGBA1C 6.2 (H) 01/14/2020    Assessment & Plan    1.  Cardiomyopathy: EF 40-45% by echo in September 2020 and again by echo in August 2021.  Stress testing in September 2020 showed a small, mild, fixed basal and mid anterolateral defect-scar versus artifact with peri-infarct ischemia.  He has been medically managed in the setting of end-stage renal disease on peritoneal dialysis.  He has noted some progressive fatigue and dyspnea on exertion though denies chest pain.  He prefers conservative therapy at this time.  He is euvolemic on examination.  Continue beta-blocker, ARB, aspirin.  Resume Plavix and statin, which he had recently run out of.  Given presumption of coronary disease, I am going to add long-acting nitrate therapy-Imdur 30 mg daily.  2.  Essential hypertension: Blood pressure elevated today at 160/90.  Adding long-acting nitrate as above.  Likely room to titrate beta-blocker further if necessary.  He otherwise remains on ARB and clonidine therapy.  3.  Hyperlipidemia: He is currently on Zetia and previously on rosuvastatin but ran out of this.  I will resume.  LDL previously 111 in August though it is not clear what he was taking at that time.  4.  Tobacco abuse: He continues to smoke.  Cessation advised.  He is not currently contemplating quitting.  5.  Lower extremity claudication/peripheral arterial disease: Recent normal ABIs.  Now followed by vascular  surgery.  6.  Stroke: Left cerebral hemisphere infarcts noted in the setting of aortic atherosclerosis.  Still notes some numbness on the right side of his face but otherwise is fully functional.  Resume statin therapy.  Continue aspirin and resume Plavix.  7.  ESRD: Still makes urine and does peritoneal dialysis at night.  Followed closely by nephrology.    8.  Disposition: Follow-up in clinic in 3 months or sooner if necessary.  Nylen Hodgkins, NP 04/02/2020, 2:23 PM

## 2020-04-03 DIAGNOSIS — D509 Iron deficiency anemia, unspecified: Secondary | ICD-10-CM | POA: Diagnosis not present

## 2020-04-03 DIAGNOSIS — N186 End stage renal disease: Secondary | ICD-10-CM | POA: Diagnosis not present

## 2020-04-03 DIAGNOSIS — Z992 Dependence on renal dialysis: Secondary | ICD-10-CM | POA: Diagnosis not present

## 2020-04-03 DIAGNOSIS — N2581 Secondary hyperparathyroidism of renal origin: Secondary | ICD-10-CM | POA: Diagnosis not present

## 2020-04-04 DIAGNOSIS — N2581 Secondary hyperparathyroidism of renal origin: Secondary | ICD-10-CM | POA: Diagnosis not present

## 2020-04-04 DIAGNOSIS — Z992 Dependence on renal dialysis: Secondary | ICD-10-CM | POA: Diagnosis not present

## 2020-04-04 DIAGNOSIS — N186 End stage renal disease: Secondary | ICD-10-CM | POA: Diagnosis not present

## 2020-04-04 DIAGNOSIS — D509 Iron deficiency anemia, unspecified: Secondary | ICD-10-CM | POA: Diagnosis not present

## 2020-04-05 DIAGNOSIS — N186 End stage renal disease: Secondary | ICD-10-CM | POA: Diagnosis not present

## 2020-04-05 DIAGNOSIS — N2581 Secondary hyperparathyroidism of renal origin: Secondary | ICD-10-CM | POA: Diagnosis not present

## 2020-04-05 DIAGNOSIS — Z992 Dependence on renal dialysis: Secondary | ICD-10-CM | POA: Diagnosis not present

## 2020-04-05 DIAGNOSIS — D509 Iron deficiency anemia, unspecified: Secondary | ICD-10-CM | POA: Diagnosis not present

## 2020-04-06 DIAGNOSIS — N2581 Secondary hyperparathyroidism of renal origin: Secondary | ICD-10-CM | POA: Diagnosis not present

## 2020-04-06 DIAGNOSIS — Z992 Dependence on renal dialysis: Secondary | ICD-10-CM | POA: Diagnosis not present

## 2020-04-06 DIAGNOSIS — N186 End stage renal disease: Secondary | ICD-10-CM | POA: Diagnosis not present

## 2020-04-06 DIAGNOSIS — D509 Iron deficiency anemia, unspecified: Secondary | ICD-10-CM | POA: Diagnosis not present

## 2020-04-07 DIAGNOSIS — N2581 Secondary hyperparathyroidism of renal origin: Secondary | ICD-10-CM | POA: Diagnosis not present

## 2020-04-07 DIAGNOSIS — N186 End stage renal disease: Secondary | ICD-10-CM | POA: Diagnosis not present

## 2020-04-07 DIAGNOSIS — Z992 Dependence on renal dialysis: Secondary | ICD-10-CM | POA: Diagnosis not present

## 2020-04-07 DIAGNOSIS — D509 Iron deficiency anemia, unspecified: Secondary | ICD-10-CM | POA: Diagnosis not present

## 2020-04-08 DIAGNOSIS — D509 Iron deficiency anemia, unspecified: Secondary | ICD-10-CM | POA: Diagnosis not present

## 2020-04-08 DIAGNOSIS — Z992 Dependence on renal dialysis: Secondary | ICD-10-CM | POA: Diagnosis not present

## 2020-04-08 DIAGNOSIS — N186 End stage renal disease: Secondary | ICD-10-CM | POA: Diagnosis not present

## 2020-04-08 DIAGNOSIS — N2581 Secondary hyperparathyroidism of renal origin: Secondary | ICD-10-CM | POA: Diagnosis not present

## 2020-04-09 DIAGNOSIS — N2581 Secondary hyperparathyroidism of renal origin: Secondary | ICD-10-CM | POA: Diagnosis not present

## 2020-04-09 DIAGNOSIS — D509 Iron deficiency anemia, unspecified: Secondary | ICD-10-CM | POA: Diagnosis not present

## 2020-04-09 DIAGNOSIS — Z992 Dependence on renal dialysis: Secondary | ICD-10-CM | POA: Diagnosis not present

## 2020-04-09 DIAGNOSIS — N186 End stage renal disease: Secondary | ICD-10-CM | POA: Diagnosis not present

## 2020-04-10 DIAGNOSIS — Z992 Dependence on renal dialysis: Secondary | ICD-10-CM | POA: Diagnosis not present

## 2020-04-10 DIAGNOSIS — N2581 Secondary hyperparathyroidism of renal origin: Secondary | ICD-10-CM | POA: Diagnosis not present

## 2020-04-10 DIAGNOSIS — N186 End stage renal disease: Secondary | ICD-10-CM | POA: Diagnosis not present

## 2020-04-10 DIAGNOSIS — D509 Iron deficiency anemia, unspecified: Secondary | ICD-10-CM | POA: Diagnosis not present

## 2020-04-11 DIAGNOSIS — N186 End stage renal disease: Secondary | ICD-10-CM | POA: Diagnosis not present

## 2020-04-11 DIAGNOSIS — D509 Iron deficiency anemia, unspecified: Secondary | ICD-10-CM | POA: Diagnosis not present

## 2020-04-11 DIAGNOSIS — N2581 Secondary hyperparathyroidism of renal origin: Secondary | ICD-10-CM | POA: Diagnosis not present

## 2020-04-11 DIAGNOSIS — Z992 Dependence on renal dialysis: Secondary | ICD-10-CM | POA: Diagnosis not present

## 2020-04-12 DIAGNOSIS — N2581 Secondary hyperparathyroidism of renal origin: Secondary | ICD-10-CM | POA: Diagnosis not present

## 2020-04-12 DIAGNOSIS — Z992 Dependence on renal dialysis: Secondary | ICD-10-CM | POA: Diagnosis not present

## 2020-04-12 DIAGNOSIS — N186 End stage renal disease: Secondary | ICD-10-CM | POA: Diagnosis not present

## 2020-04-12 DIAGNOSIS — D509 Iron deficiency anemia, unspecified: Secondary | ICD-10-CM | POA: Diagnosis not present

## 2020-04-13 DIAGNOSIS — Z992 Dependence on renal dialysis: Secondary | ICD-10-CM | POA: Diagnosis not present

## 2020-04-13 DIAGNOSIS — D509 Iron deficiency anemia, unspecified: Secondary | ICD-10-CM | POA: Diagnosis not present

## 2020-04-13 DIAGNOSIS — N186 End stage renal disease: Secondary | ICD-10-CM | POA: Diagnosis not present

## 2020-04-13 DIAGNOSIS — N2581 Secondary hyperparathyroidism of renal origin: Secondary | ICD-10-CM | POA: Diagnosis not present

## 2020-04-14 DIAGNOSIS — D509 Iron deficiency anemia, unspecified: Secondary | ICD-10-CM | POA: Diagnosis not present

## 2020-04-14 DIAGNOSIS — N2581 Secondary hyperparathyroidism of renal origin: Secondary | ICD-10-CM | POA: Diagnosis not present

## 2020-04-14 DIAGNOSIS — Z992 Dependence on renal dialysis: Secondary | ICD-10-CM | POA: Diagnosis not present

## 2020-04-14 DIAGNOSIS — N186 End stage renal disease: Secondary | ICD-10-CM | POA: Diagnosis not present

## 2020-04-15 DIAGNOSIS — Z992 Dependence on renal dialysis: Secondary | ICD-10-CM | POA: Diagnosis not present

## 2020-04-15 DIAGNOSIS — N186 End stage renal disease: Secondary | ICD-10-CM | POA: Diagnosis not present

## 2020-04-15 DIAGNOSIS — D509 Iron deficiency anemia, unspecified: Secondary | ICD-10-CM | POA: Diagnosis not present

## 2020-04-15 DIAGNOSIS — N2581 Secondary hyperparathyroidism of renal origin: Secondary | ICD-10-CM | POA: Diagnosis not present

## 2020-04-16 DIAGNOSIS — N186 End stage renal disease: Secondary | ICD-10-CM | POA: Diagnosis not present

## 2020-04-16 DIAGNOSIS — Z992 Dependence on renal dialysis: Secondary | ICD-10-CM | POA: Diagnosis not present

## 2020-04-16 DIAGNOSIS — D509 Iron deficiency anemia, unspecified: Secondary | ICD-10-CM | POA: Diagnosis not present

## 2020-04-16 DIAGNOSIS — N2581 Secondary hyperparathyroidism of renal origin: Secondary | ICD-10-CM | POA: Diagnosis not present

## 2020-04-17 DIAGNOSIS — D509 Iron deficiency anemia, unspecified: Secondary | ICD-10-CM | POA: Diagnosis not present

## 2020-04-17 DIAGNOSIS — N186 End stage renal disease: Secondary | ICD-10-CM | POA: Diagnosis not present

## 2020-04-17 DIAGNOSIS — N2581 Secondary hyperparathyroidism of renal origin: Secondary | ICD-10-CM | POA: Diagnosis not present

## 2020-04-17 DIAGNOSIS — Z992 Dependence on renal dialysis: Secondary | ICD-10-CM | POA: Diagnosis not present

## 2020-04-18 DIAGNOSIS — N2581 Secondary hyperparathyroidism of renal origin: Secondary | ICD-10-CM | POA: Diagnosis not present

## 2020-04-18 DIAGNOSIS — N186 End stage renal disease: Secondary | ICD-10-CM | POA: Diagnosis not present

## 2020-04-18 DIAGNOSIS — Z992 Dependence on renal dialysis: Secondary | ICD-10-CM | POA: Diagnosis not present

## 2020-04-18 DIAGNOSIS — D509 Iron deficiency anemia, unspecified: Secondary | ICD-10-CM | POA: Diagnosis not present

## 2020-04-19 DIAGNOSIS — Z992 Dependence on renal dialysis: Secondary | ICD-10-CM | POA: Diagnosis not present

## 2020-04-19 DIAGNOSIS — N2581 Secondary hyperparathyroidism of renal origin: Secondary | ICD-10-CM | POA: Diagnosis not present

## 2020-04-19 DIAGNOSIS — N186 End stage renal disease: Secondary | ICD-10-CM | POA: Diagnosis not present

## 2020-04-19 DIAGNOSIS — D509 Iron deficiency anemia, unspecified: Secondary | ICD-10-CM | POA: Diagnosis not present

## 2020-04-20 DIAGNOSIS — D509 Iron deficiency anemia, unspecified: Secondary | ICD-10-CM | POA: Diagnosis not present

## 2020-04-20 DIAGNOSIS — N2581 Secondary hyperparathyroidism of renal origin: Secondary | ICD-10-CM | POA: Diagnosis not present

## 2020-04-20 DIAGNOSIS — N186 End stage renal disease: Secondary | ICD-10-CM | POA: Diagnosis not present

## 2020-04-20 DIAGNOSIS — Z992 Dependence on renal dialysis: Secondary | ICD-10-CM | POA: Diagnosis not present

## 2020-04-21 DIAGNOSIS — N186 End stage renal disease: Secondary | ICD-10-CM | POA: Diagnosis not present

## 2020-04-21 DIAGNOSIS — Z992 Dependence on renal dialysis: Secondary | ICD-10-CM | POA: Diagnosis not present

## 2020-04-21 DIAGNOSIS — N2581 Secondary hyperparathyroidism of renal origin: Secondary | ICD-10-CM | POA: Diagnosis not present

## 2020-04-21 DIAGNOSIS — D509 Iron deficiency anemia, unspecified: Secondary | ICD-10-CM | POA: Diagnosis not present

## 2020-04-22 DIAGNOSIS — Z992 Dependence on renal dialysis: Secondary | ICD-10-CM | POA: Diagnosis not present

## 2020-04-22 DIAGNOSIS — N2581 Secondary hyperparathyroidism of renal origin: Secondary | ICD-10-CM | POA: Diagnosis not present

## 2020-04-22 DIAGNOSIS — N186 End stage renal disease: Secondary | ICD-10-CM | POA: Diagnosis not present

## 2020-04-22 DIAGNOSIS — D509 Iron deficiency anemia, unspecified: Secondary | ICD-10-CM | POA: Diagnosis not present

## 2020-04-23 DIAGNOSIS — Z992 Dependence on renal dialysis: Secondary | ICD-10-CM | POA: Diagnosis not present

## 2020-04-23 DIAGNOSIS — D509 Iron deficiency anemia, unspecified: Secondary | ICD-10-CM | POA: Diagnosis not present

## 2020-04-23 DIAGNOSIS — N186 End stage renal disease: Secondary | ICD-10-CM | POA: Diagnosis not present

## 2020-04-23 DIAGNOSIS — D631 Anemia in chronic kidney disease: Secondary | ICD-10-CM | POA: Diagnosis not present

## 2020-04-24 DIAGNOSIS — Z992 Dependence on renal dialysis: Secondary | ICD-10-CM | POA: Diagnosis not present

## 2020-04-24 DIAGNOSIS — N186 End stage renal disease: Secondary | ICD-10-CM | POA: Diagnosis not present

## 2020-04-24 DIAGNOSIS — D631 Anemia in chronic kidney disease: Secondary | ICD-10-CM | POA: Diagnosis not present

## 2020-04-24 DIAGNOSIS — D509 Iron deficiency anemia, unspecified: Secondary | ICD-10-CM | POA: Diagnosis not present

## 2020-04-25 DIAGNOSIS — D509 Iron deficiency anemia, unspecified: Secondary | ICD-10-CM | POA: Diagnosis not present

## 2020-04-25 DIAGNOSIS — Z992 Dependence on renal dialysis: Secondary | ICD-10-CM | POA: Diagnosis not present

## 2020-04-25 DIAGNOSIS — N186 End stage renal disease: Secondary | ICD-10-CM | POA: Diagnosis not present

## 2020-04-25 DIAGNOSIS — D631 Anemia in chronic kidney disease: Secondary | ICD-10-CM | POA: Diagnosis not present

## 2020-04-26 DIAGNOSIS — D631 Anemia in chronic kidney disease: Secondary | ICD-10-CM | POA: Diagnosis not present

## 2020-04-26 DIAGNOSIS — D509 Iron deficiency anemia, unspecified: Secondary | ICD-10-CM | POA: Diagnosis not present

## 2020-04-26 DIAGNOSIS — Z992 Dependence on renal dialysis: Secondary | ICD-10-CM | POA: Diagnosis not present

## 2020-04-26 DIAGNOSIS — N186 End stage renal disease: Secondary | ICD-10-CM | POA: Diagnosis not present

## 2020-04-27 DIAGNOSIS — D631 Anemia in chronic kidney disease: Secondary | ICD-10-CM | POA: Diagnosis not present

## 2020-04-27 DIAGNOSIS — N186 End stage renal disease: Secondary | ICD-10-CM | POA: Diagnosis not present

## 2020-04-27 DIAGNOSIS — D509 Iron deficiency anemia, unspecified: Secondary | ICD-10-CM | POA: Diagnosis not present

## 2020-04-27 DIAGNOSIS — Z992 Dependence on renal dialysis: Secondary | ICD-10-CM | POA: Diagnosis not present

## 2020-04-28 DIAGNOSIS — D631 Anemia in chronic kidney disease: Secondary | ICD-10-CM | POA: Diagnosis not present

## 2020-04-28 DIAGNOSIS — N186 End stage renal disease: Secondary | ICD-10-CM | POA: Diagnosis not present

## 2020-04-28 DIAGNOSIS — D509 Iron deficiency anemia, unspecified: Secondary | ICD-10-CM | POA: Diagnosis not present

## 2020-04-28 DIAGNOSIS — Z992 Dependence on renal dialysis: Secondary | ICD-10-CM | POA: Diagnosis not present

## 2020-04-29 DIAGNOSIS — D509 Iron deficiency anemia, unspecified: Secondary | ICD-10-CM | POA: Diagnosis not present

## 2020-04-29 DIAGNOSIS — D631 Anemia in chronic kidney disease: Secondary | ICD-10-CM | POA: Diagnosis not present

## 2020-04-29 DIAGNOSIS — N186 End stage renal disease: Secondary | ICD-10-CM | POA: Diagnosis not present

## 2020-04-29 DIAGNOSIS — Z992 Dependence on renal dialysis: Secondary | ICD-10-CM | POA: Diagnosis not present

## 2020-04-30 DIAGNOSIS — D509 Iron deficiency anemia, unspecified: Secondary | ICD-10-CM | POA: Diagnosis not present

## 2020-04-30 DIAGNOSIS — Z992 Dependence on renal dialysis: Secondary | ICD-10-CM | POA: Diagnosis not present

## 2020-04-30 DIAGNOSIS — N186 End stage renal disease: Secondary | ICD-10-CM | POA: Diagnosis not present

## 2020-04-30 DIAGNOSIS — D631 Anemia in chronic kidney disease: Secondary | ICD-10-CM | POA: Diagnosis not present

## 2020-05-01 DIAGNOSIS — N186 End stage renal disease: Secondary | ICD-10-CM | POA: Diagnosis not present

## 2020-05-01 DIAGNOSIS — Z992 Dependence on renal dialysis: Secondary | ICD-10-CM | POA: Diagnosis not present

## 2020-05-01 DIAGNOSIS — D509 Iron deficiency anemia, unspecified: Secondary | ICD-10-CM | POA: Diagnosis not present

## 2020-05-01 DIAGNOSIS — D631 Anemia in chronic kidney disease: Secondary | ICD-10-CM | POA: Diagnosis not present

## 2020-05-02 DIAGNOSIS — D509 Iron deficiency anemia, unspecified: Secondary | ICD-10-CM | POA: Diagnosis not present

## 2020-05-02 DIAGNOSIS — N186 End stage renal disease: Secondary | ICD-10-CM | POA: Diagnosis not present

## 2020-05-02 DIAGNOSIS — D631 Anemia in chronic kidney disease: Secondary | ICD-10-CM | POA: Diagnosis not present

## 2020-05-02 DIAGNOSIS — Z992 Dependence on renal dialysis: Secondary | ICD-10-CM | POA: Diagnosis not present

## 2020-05-03 DIAGNOSIS — D509 Iron deficiency anemia, unspecified: Secondary | ICD-10-CM | POA: Diagnosis not present

## 2020-05-03 DIAGNOSIS — Z992 Dependence on renal dialysis: Secondary | ICD-10-CM | POA: Diagnosis not present

## 2020-05-03 DIAGNOSIS — N186 End stage renal disease: Secondary | ICD-10-CM | POA: Diagnosis not present

## 2020-05-03 DIAGNOSIS — D631 Anemia in chronic kidney disease: Secondary | ICD-10-CM | POA: Diagnosis not present

## 2020-05-04 DIAGNOSIS — Z992 Dependence on renal dialysis: Secondary | ICD-10-CM | POA: Diagnosis not present

## 2020-05-04 DIAGNOSIS — D509 Iron deficiency anemia, unspecified: Secondary | ICD-10-CM | POA: Diagnosis not present

## 2020-05-04 DIAGNOSIS — N186 End stage renal disease: Secondary | ICD-10-CM | POA: Diagnosis not present

## 2020-05-04 DIAGNOSIS — D631 Anemia in chronic kidney disease: Secondary | ICD-10-CM | POA: Diagnosis not present

## 2020-05-05 DIAGNOSIS — D631 Anemia in chronic kidney disease: Secondary | ICD-10-CM | POA: Diagnosis not present

## 2020-05-05 DIAGNOSIS — N186 End stage renal disease: Secondary | ICD-10-CM | POA: Diagnosis not present

## 2020-05-05 DIAGNOSIS — D509 Iron deficiency anemia, unspecified: Secondary | ICD-10-CM | POA: Diagnosis not present

## 2020-05-05 DIAGNOSIS — Z992 Dependence on renal dialysis: Secondary | ICD-10-CM | POA: Diagnosis not present

## 2020-05-06 DIAGNOSIS — D509 Iron deficiency anemia, unspecified: Secondary | ICD-10-CM | POA: Diagnosis not present

## 2020-05-06 DIAGNOSIS — N186 End stage renal disease: Secondary | ICD-10-CM | POA: Diagnosis not present

## 2020-05-06 DIAGNOSIS — D631 Anemia in chronic kidney disease: Secondary | ICD-10-CM | POA: Diagnosis not present

## 2020-05-06 DIAGNOSIS — Z992 Dependence on renal dialysis: Secondary | ICD-10-CM | POA: Diagnosis not present

## 2020-05-07 DIAGNOSIS — D631 Anemia in chronic kidney disease: Secondary | ICD-10-CM | POA: Diagnosis not present

## 2020-05-07 DIAGNOSIS — D509 Iron deficiency anemia, unspecified: Secondary | ICD-10-CM | POA: Diagnosis not present

## 2020-05-07 DIAGNOSIS — Z992 Dependence on renal dialysis: Secondary | ICD-10-CM | POA: Diagnosis not present

## 2020-05-07 DIAGNOSIS — N186 End stage renal disease: Secondary | ICD-10-CM | POA: Diagnosis not present

## 2020-05-08 DIAGNOSIS — N186 End stage renal disease: Secondary | ICD-10-CM | POA: Diagnosis not present

## 2020-05-08 DIAGNOSIS — Z992 Dependence on renal dialysis: Secondary | ICD-10-CM | POA: Diagnosis not present

## 2020-05-08 DIAGNOSIS — D631 Anemia in chronic kidney disease: Secondary | ICD-10-CM | POA: Diagnosis not present

## 2020-05-08 DIAGNOSIS — D509 Iron deficiency anemia, unspecified: Secondary | ICD-10-CM | POA: Diagnosis not present

## 2020-05-09 DIAGNOSIS — D509 Iron deficiency anemia, unspecified: Secondary | ICD-10-CM | POA: Diagnosis not present

## 2020-05-09 DIAGNOSIS — D631 Anemia in chronic kidney disease: Secondary | ICD-10-CM | POA: Diagnosis not present

## 2020-05-09 DIAGNOSIS — Z992 Dependence on renal dialysis: Secondary | ICD-10-CM | POA: Diagnosis not present

## 2020-05-09 DIAGNOSIS — N186 End stage renal disease: Secondary | ICD-10-CM | POA: Diagnosis not present

## 2020-05-10 DIAGNOSIS — D509 Iron deficiency anemia, unspecified: Secondary | ICD-10-CM | POA: Diagnosis not present

## 2020-05-10 DIAGNOSIS — D631 Anemia in chronic kidney disease: Secondary | ICD-10-CM | POA: Diagnosis not present

## 2020-05-10 DIAGNOSIS — Z992 Dependence on renal dialysis: Secondary | ICD-10-CM | POA: Diagnosis not present

## 2020-05-10 DIAGNOSIS — N186 End stage renal disease: Secondary | ICD-10-CM | POA: Diagnosis not present

## 2020-05-11 ENCOUNTER — Encounter: Payer: Self-pay | Admitting: Emergency Medicine

## 2020-05-11 ENCOUNTER — Emergency Department: Payer: Medicare Other

## 2020-05-11 ENCOUNTER — Inpatient Hospital Stay
Admission: EM | Admit: 2020-05-11 | Discharge: 2020-05-14 | DRG: 304 | Disposition: A | Discharge status: Home / Self Care | Payer: Medicare Other | Attending: Internal Medicine | Admitting: Internal Medicine

## 2020-05-11 ENCOUNTER — Other Ambulatory Visit: Payer: Self-pay

## 2020-05-11 DIAGNOSIS — Z85528 Personal history of other malignant neoplasm of kidney: Secondary | ICD-10-CM

## 2020-05-11 DIAGNOSIS — I639 Cerebral infarction, unspecified: Secondary | ICD-10-CM | POA: Diagnosis not present

## 2020-05-11 DIAGNOSIS — Z905 Acquired absence of kidney: Secondary | ICD-10-CM

## 2020-05-11 DIAGNOSIS — E1151 Type 2 diabetes mellitus with diabetic peripheral angiopathy without gangrene: Secondary | ICD-10-CM | POA: Diagnosis present

## 2020-05-11 DIAGNOSIS — E1122 Type 2 diabetes mellitus with diabetic chronic kidney disease: Secondary | ICD-10-CM

## 2020-05-11 DIAGNOSIS — D509 Iron deficiency anemia, unspecified: Secondary | ICD-10-CM | POA: Diagnosis not present

## 2020-05-11 DIAGNOSIS — Z809 Family history of malignant neoplasm, unspecified: Secondary | ICD-10-CM

## 2020-05-11 DIAGNOSIS — Z825 Family history of asthma and other chronic lower respiratory diseases: Secondary | ICD-10-CM

## 2020-05-11 DIAGNOSIS — I5022 Chronic systolic (congestive) heart failure: Secondary | ICD-10-CM | POA: Insufficient documentation

## 2020-05-11 DIAGNOSIS — F17210 Nicotine dependence, cigarettes, uncomplicated: Secondary | ICD-10-CM | POA: Diagnosis present

## 2020-05-11 DIAGNOSIS — Z8249 Family history of ischemic heart disease and other diseases of the circulatory system: Secondary | ICD-10-CM

## 2020-05-11 DIAGNOSIS — R0789 Other chest pain: Secondary | ICD-10-CM | POA: Diagnosis not present

## 2020-05-11 DIAGNOSIS — D638 Anemia in other chronic diseases classified elsewhere: Secondary | ICD-10-CM

## 2020-05-11 DIAGNOSIS — I4891 Unspecified atrial fibrillation: Secondary | ICD-10-CM | POA: Diagnosis not present

## 2020-05-11 DIAGNOSIS — I132 Hypertensive heart and chronic kidney disease with heart failure and with stage 5 chronic kidney disease, or end stage renal disease: Secondary | ICD-10-CM | POA: Diagnosis present

## 2020-05-11 DIAGNOSIS — Z8509 Personal history of malignant neoplasm of other digestive organs: Secondary | ICD-10-CM

## 2020-05-11 DIAGNOSIS — R0601 Orthopnea: Secondary | ICD-10-CM

## 2020-05-11 DIAGNOSIS — I160 Hypertensive urgency: Secondary | ICD-10-CM | POA: Diagnosis present

## 2020-05-11 DIAGNOSIS — N2581 Secondary hyperparathyroidism of renal origin: Secondary | ICD-10-CM | POA: Diagnosis present

## 2020-05-11 DIAGNOSIS — I509 Heart failure, unspecified: Secondary | ICD-10-CM | POA: Diagnosis not present

## 2020-05-11 DIAGNOSIS — I77810 Thoracic aortic ectasia: Secondary | ICD-10-CM | POA: Diagnosis present

## 2020-05-11 DIAGNOSIS — R778 Other specified abnormalities of plasma proteins: Secondary | ICD-10-CM | POA: Diagnosis not present

## 2020-05-11 DIAGNOSIS — Z79899 Other long term (current) drug therapy: Secondary | ICD-10-CM

## 2020-05-11 DIAGNOSIS — J90 Pleural effusion, not elsewhere classified: Secondary | ICD-10-CM | POA: Diagnosis not present

## 2020-05-11 DIAGNOSIS — Z20822 Contact with and (suspected) exposure to covid-19: Secondary | ICD-10-CM | POA: Diagnosis not present

## 2020-05-11 DIAGNOSIS — I5043 Acute on chronic combined systolic (congestive) and diastolic (congestive) heart failure: Secondary | ICD-10-CM | POA: Diagnosis not present

## 2020-05-11 DIAGNOSIS — R0602 Shortness of breath: Secondary | ICD-10-CM

## 2020-05-11 DIAGNOSIS — I517 Cardiomegaly: Secondary | ICD-10-CM | POA: Diagnosis not present

## 2020-05-11 DIAGNOSIS — E876 Hypokalemia: Secondary | ICD-10-CM | POA: Diagnosis not present

## 2020-05-11 DIAGNOSIS — E785 Hyperlipidemia, unspecified: Secondary | ICD-10-CM | POA: Diagnosis not present

## 2020-05-11 DIAGNOSIS — I5023 Acute on chronic systolic (congestive) heart failure: Secondary | ICD-10-CM | POA: Diagnosis not present

## 2020-05-11 DIAGNOSIS — Z8673 Personal history of transient ischemic attack (TIA), and cerebral infarction without residual deficits: Secondary | ICD-10-CM

## 2020-05-11 DIAGNOSIS — R059 Cough, unspecified: Secondary | ICD-10-CM

## 2020-05-11 DIAGNOSIS — D631 Anemia in chronic kidney disease: Secondary | ICD-10-CM | POA: Diagnosis not present

## 2020-05-11 DIAGNOSIS — I499 Cardiac arrhythmia, unspecified: Secondary | ICD-10-CM | POA: Diagnosis not present

## 2020-05-11 DIAGNOSIS — G4733 Obstructive sleep apnea (adult) (pediatric): Secondary | ICD-10-CM | POA: Diagnosis present

## 2020-05-11 DIAGNOSIS — C61 Malignant neoplasm of prostate: Secondary | ICD-10-CM | POA: Diagnosis present

## 2020-05-11 DIAGNOSIS — Z9119 Patient's noncompliance with other medical treatment and regimen: Secondary | ICD-10-CM

## 2020-05-11 DIAGNOSIS — F1910 Other psychoactive substance abuse, uncomplicated: Secondary | ICD-10-CM | POA: Diagnosis present

## 2020-05-11 DIAGNOSIS — I480 Paroxysmal atrial fibrillation: Secondary | ICD-10-CM | POA: Diagnosis present

## 2020-05-11 DIAGNOSIS — I213 ST elevation (STEMI) myocardial infarction of unspecified site: Secondary | ICD-10-CM | POA: Diagnosis not present

## 2020-05-11 DIAGNOSIS — J9811 Atelectasis: Secondary | ICD-10-CM | POA: Diagnosis not present

## 2020-05-11 DIAGNOSIS — Z8546 Personal history of malignant neoplasm of prostate: Secondary | ICD-10-CM

## 2020-05-11 DIAGNOSIS — N186 End stage renal disease: Secondary | ICD-10-CM | POA: Diagnosis not present

## 2020-05-11 DIAGNOSIS — Z7982 Long term (current) use of aspirin: Secondary | ICD-10-CM

## 2020-05-11 DIAGNOSIS — E875 Hyperkalemia: Secondary | ICD-10-CM | POA: Diagnosis not present

## 2020-05-11 DIAGNOSIS — I25110 Atherosclerotic heart disease of native coronary artery with unstable angina pectoris: Secondary | ICD-10-CM | POA: Diagnosis present

## 2020-05-11 DIAGNOSIS — Z992 Dependence on renal dialysis: Secondary | ICD-10-CM | POA: Diagnosis not present

## 2020-05-11 DIAGNOSIS — I10 Essential (primary) hypertension: Secondary | ICD-10-CM | POA: Diagnosis not present

## 2020-05-11 DIAGNOSIS — R079 Chest pain, unspecified: Secondary | ICD-10-CM | POA: Diagnosis not present

## 2020-05-11 DIAGNOSIS — J449 Chronic obstructive pulmonary disease, unspecified: Secondary | ICD-10-CM | POA: Diagnosis present

## 2020-05-11 DIAGNOSIS — I471 Supraventricular tachycardia: Secondary | ICD-10-CM | POA: Diagnosis not present

## 2020-05-11 DIAGNOSIS — I493 Ventricular premature depolarization: Secondary | ICD-10-CM | POA: Diagnosis present

## 2020-05-11 DIAGNOSIS — I5020 Unspecified systolic (congestive) heart failure: Secondary | ICD-10-CM

## 2020-05-11 DIAGNOSIS — E1169 Type 2 diabetes mellitus with other specified complication: Secondary | ICD-10-CM | POA: Diagnosis present

## 2020-05-11 DIAGNOSIS — I700 Atherosclerosis of aorta: Secondary | ICD-10-CM | POA: Diagnosis present

## 2020-05-11 DIAGNOSIS — Z7902 Long term (current) use of antithrombotics/antiplatelets: Secondary | ICD-10-CM

## 2020-05-11 DIAGNOSIS — E8770 Fluid overload, unspecified: Secondary | ICD-10-CM | POA: Diagnosis present

## 2020-05-11 DIAGNOSIS — I248 Other forms of acute ischemic heart disease: Secondary | ICD-10-CM | POA: Diagnosis present

## 2020-05-11 DIAGNOSIS — I110 Hypertensive heart disease with heart failure: Secondary | ICD-10-CM | POA: Diagnosis not present

## 2020-05-11 DIAGNOSIS — Z823 Family history of stroke: Secondary | ICD-10-CM

## 2020-05-11 LAB — TSH: TSH: 1.555 u[IU]/mL (ref 0.350–4.500)

## 2020-05-11 LAB — RESP PANEL BY RT-PCR (FLU A&B, COVID) ARPGX2
Influenza A by PCR: NEGATIVE
Influenza B by PCR: NEGATIVE
SARS Coronavirus 2 by RT PCR: NEGATIVE

## 2020-05-11 LAB — BASIC METABOLIC PANEL
Anion gap: 14 (ref 5–15)
BUN: 59 mg/dL — ABNORMAL HIGH (ref 8–23)
CO2: 22 mmol/L (ref 22–32)
Calcium: 8.6 mg/dL — ABNORMAL LOW (ref 8.9–10.3)
Chloride: 105 mmol/L (ref 98–111)
Creatinine, Ser: 7.19 mg/dL — ABNORMAL HIGH (ref 0.61–1.24)
GFR, Estimated: 7 mL/min — ABNORMAL LOW (ref >60)
Glucose, Bld: 139 mg/dL — ABNORMAL HIGH (ref 70–99)
Potassium: 4.4 mmol/L (ref 3.5–5.1)
Sodium: 141 mmol/L (ref 135–145)

## 2020-05-11 LAB — URINE DRUG SCREEN, QUALITATIVE (ARMC ONLY)
Amphetamines, Ur Screen: NOT DETECTED
Barbiturates, Ur Screen: NOT DETECTED
Benzodiazepine, Ur Scrn: NOT DETECTED
Cannabinoid 50 Ng, Ur ~~LOC~~: NOT DETECTED
Cocaine Metabolite,Ur ~~LOC~~: NOT DETECTED
MDMA (Ecstasy)Ur Screen: NOT DETECTED
Methadone Scn, Ur: NOT DETECTED
Opiate, Ur Screen: NOT DETECTED
Phencyclidine (PCP) Ur S: NOT DETECTED
Tricyclic, Ur Screen: NOT DETECTED

## 2020-05-11 LAB — CBC
HCT: 26.3 % — ABNORMAL LOW (ref 39.0–52.0)
Hemoglobin: 9.2 g/dL — ABNORMAL LOW (ref 13.0–17.0)
MCH: 31.7 pg (ref 26.0–34.0)
MCHC: 35 g/dL (ref 30.0–36.0)
MCV: 90.7 fL (ref 80.0–100.0)
Platelets: 166 10*3/uL (ref 150–400)
RBC: 2.9 MIL/uL — ABNORMAL LOW (ref 4.22–5.81)
RDW: 13.7 % (ref 11.5–15.5)
WBC: 10.2 10*3/uL (ref 4.0–10.5)
nRBC: 0 % (ref 0.0–0.2)

## 2020-05-11 LAB — BRAIN NATRIURETIC PEPTIDE: B Natriuretic Peptide: 3057.7 pg/mL — ABNORMAL HIGH (ref 0.0–100.0)

## 2020-05-11 LAB — TROPONIN I (HIGH SENSITIVITY)
Troponin I (High Sensitivity): 21 ng/L — ABNORMAL HIGH (ref <18)
Troponin I (High Sensitivity): 153 ng/L (ref <18)
Troponin I (High Sensitivity): 105 ng/L (ref <18)
Troponin I (High Sensitivity): 177 ng/L (ref <18)

## 2020-05-11 LAB — MAGNESIUM: Magnesium: 2.2 mg/dL (ref 1.7–2.4)

## 2020-05-11 MED ORDER — AMLODIPINE BESYLATE 10 MG PO TABS
10.0000 mg | ORAL_TABLET | Freq: Every day | ORAL | Status: DC
  Administered 2020-05-11 – 2020-05-14 (×4): 10 mg via ORAL
  Filled 2020-05-11: qty 2
  Filled 2020-05-11: qty 1
  Filled 2020-05-11: qty 2
  Filled 2020-05-11: qty 1

## 2020-05-11 MED ORDER — LEVALBUTEROL HCL 0.63 MG/3ML IN NEBU
0.6300 mg | INHALATION_SOLUTION | Freq: Four times a day (QID) | RESPIRATORY_TRACT | Status: DC | PRN
  Administered 2020-05-11: 0.63 mg via RESPIRATORY_TRACT
  Filled 2020-05-11 (×2): qty 3

## 2020-05-11 MED ORDER — DM-GUAIFENESIN ER 30-600 MG PO TB12: 1.0000 | ORAL_TABLET | Freq: Two times a day (BID) | ORAL | Status: DC | PRN

## 2020-05-11 MED ORDER — FUROSEMIDE 10 MG/ML IJ SOLN
40.0000 mg | Freq: Once | INTRAMUSCULAR | Status: AC
  Administered 2020-05-11: 40 mg via INTRAVENOUS
  Filled 2020-05-11: qty 4

## 2020-05-11 MED ORDER — DOXAZOSIN MESYLATE 8 MG PO TABS
8.0000 mg | ORAL_TABLET | Freq: Every day | ORAL | Status: DC
Start: 2020-05-11 — End: 2020-05-14
  Administered 2020-05-11 – 2020-05-14 (×4): 8 mg via ORAL
  Filled 2020-05-11 (×5): qty 1

## 2020-05-11 MED ORDER — NITROGLYCERIN 2 % TD OINT
0.5000 [in_us] | TOPICAL_OINTMENT | Freq: Once | TRANSDERMAL | Status: AC
  Administered 2020-05-11: 0.5 [in_us] via TOPICAL
  Filled 2020-05-11: qty 1

## 2020-05-11 MED ORDER — CLONIDINE HCL 0.1 MG PO TABS
0.2000 mg | ORAL_TABLET | Freq: Three times a day (TID) | ORAL | Status: DC
  Administered 2020-05-11 – 2020-05-14 (×8): 0.2 mg via ORAL
  Filled 2020-05-11 (×8): qty 2

## 2020-05-11 MED ORDER — DILTIAZEM HCL 25 MG/5ML IV SOLN
10.0000 mg | Freq: Once | INTRAVENOUS | Status: AC
  Administered 2020-05-11: 10 mg via INTRAVENOUS
  Filled 2020-05-11: qty 5

## 2020-05-11 MED ORDER — HYDRALAZINE HCL 20 MG/ML IJ SOLN
5.0000 mg | INTRAMUSCULAR | Status: DC | PRN
  Administered 2020-05-12 (×3): 5 mg via INTRAVENOUS
  Filled 2020-05-11 (×4): qty 1

## 2020-05-11 MED ORDER — HYDRALAZINE HCL 20 MG/ML IJ SOLN
10.0000 mg | Freq: Once | INTRAMUSCULAR | Status: AC
  Administered 2020-05-11: 10 mg via INTRAVENOUS
  Filled 2020-05-11: qty 1

## 2020-05-11 MED ORDER — CARVEDILOL 25 MG PO TABS
25.0000 mg | ORAL_TABLET | Freq: Two times a day (BID) | ORAL | Status: DC
  Administered 2020-05-11 – 2020-05-14 (×6): 25 mg via ORAL
  Filled 2020-05-11 (×6): qty 1

## 2020-05-11 MED ORDER — ACETAMINOPHEN 325 MG PO TABS: 650.0000 mg | ORAL_TABLET | Freq: Four times a day (QID) | ORAL | Status: DC | PRN

## 2020-05-11 MED ORDER — NICOTINE 21 MG/24HR TD PT24
21.0000 mg | MEDICATED_PATCH | Freq: Every day | TRANSDERMAL | Status: DC
  Administered 2020-05-11 – 2020-05-14 (×4): 21 mg via TRANSDERMAL
  Filled 2020-05-11 (×4): qty 1

## 2020-05-11 MED ORDER — ALBUTEROL SULFATE (2.5 MG/3ML) 0.083% IN NEBU
2.5000 mg | INHALATION_SOLUTION | RESPIRATORY_TRACT | Status: DC | PRN
  Filled 2020-05-11: qty 3

## 2020-05-11 NOTE — ED Notes (Signed)
Patient  Is showing A-fib with occasional PVC at a rate in the 60's

## 2020-05-11 NOTE — Progress Notes (Signed)
[name]  MRN: [id number]  DOB/AGE: [id number] y.o.  Primary Care Physician:Tullo, Aris Everts, MD  Admit date: 05/11/2020  Chief Complaint:  Chief Complaint  Patient presents with  . Shortness of Breath    S-Pt presented on  05/11/2020 with  Chief Complaint  Patient presents with  . Shortness of Breath  . Patient is a 74 year old Caucasian male with a past medical history of ESRD on peritoneal dialysis, hypertension, hyperlipidemia, diabetes mellitus, COPD, CVA, renal cell carcinoma, prostate cancer, CHF with ejection fraction of 40--45% who came to the ER with chief complaint of shortness of breath.  History of present illness dates back to past few days ago when patient started feeling short of breath, it was progressively worsening, it is exacerbated by exertion.  Patient also complained of palpitations and feeling that his heartbeat is irregular. Upon presentation to the ER patient was found to have atrial fibrillation with RVR.  Patient heart rate was up to 170s.  Patient was given IV beta-blockers.  Patient converted into sinus rhythm.  Nephrology was consulted for comanagement of dialysis patient. Patient was seen today in the ER. Patient gives a history that from past few weeks he has been not using the cycler but has been dialyzing himself manually. Patient then added the detail that and start off 3 cycles he only does 1 or 2 and sometimes he misses his treatment. Patient main concern was shortness of breath and palpitations Patient also gives a history of orthopnea Patient also complains of lower extremity swelling I discussed the patient about how he should not miss the treatments.  Educated patient to the best of my ability    Medications . amLODipine  10 mg Oral Daily  . carvedilol  25 mg Oral BID WC  . cloNIDine  0.2 mg Oral TID  . doxazosin  8 mg Oral Daily  . ipratropium-albuterol  3 mL Nebulization Q6H  . nicotine  21 mg Transdermal Daily          AGT:XMIWO from the symptoms mentioned above,there are no other symptoms referable to all systems reviewed.  Physical Exam: Vital signs in last 24 hours: Temp:  [97.7 F (36.5 C)-97.9 F (36.6 C)] 97.9 F (36.6 C) (12/19 1727) Pulse Rate:  [56-80] 80 (12/19 1727) Resp:  [18-27] 18 (12/19 1727) BP: (189-217)/(71-141) 196/87 (12/19 1727) SpO2:  [95 %-99 %] 98 % (12/19 1727) Weight:  [64.4 kg] 64.4 kg (12/19 1039) Weight change:     Intake/Output from previous day: No intake/output data recorded. Total I/O In: -  Out: 800 [Urine:800]   Physical Exam:  General- pt is awake,alert, oriented to time place and person  Resp- No acute REsp distress, decreased at bases  CVS- S1S2 irregular in rate and rhythm  GIT- BS+, soft, Non tender , Non distended  EXT- 1+ LE Edema,  No Cyanosis  Access- PD cath in situ   Lab Results:  CBC  Recent Labs    05/11/20 1042  WBC 10.2  HGB 9.2*  HCT 26.3*  PLT 166    BMET  Recent Labs    05/11/20 1042  NA 141  K 4.4  CL 105  CO2 22  GLUCOSE 139*  BUN 59*  CREATININE 7.19*  CALCIUM 8.6*      Most recent Creatinine trend  Lab Results  Component Value Date   CREATININE 7.19 (H) 05/11/2020   CREATININE 7.60 (H) 01/14/2020   CREATININE 7.15 (H) 02/26/2019      MICRO  Recent Results (from the past 240 hour(s))  Resp Panel by RT-PCR (Flu A&B, Covid) Nasopharyngeal Swab     Status: None   Collection Time: 05/11/20 11:39 AM   Specimen: Nasopharyngeal Swab; Nasopharyngeal(NP) swabs in vial transport medium  Result Value Ref Range Status   SARS Coronavirus 2 by RT PCR NEGATIVE NEGATIVE Final    Comment: (NOTE) SARS-CoV-2 target nucleic acids are NOT DETECTED.  The SARS-CoV-2 RNA is generally detectable in upper respiratory specimens during the acute phase of infection. The lowest concentration of SARS-CoV-2 viral copies this assay can detect is 138 copies/mL. A negative result does not preclude  SARS-Cov-2 infection and should not be used as the sole basis for treatment or other patient management decisions. A negative result may occur with  improper specimen collection/handling, submission of specimen other than nasopharyngeal swab, presence of viral mutation(s) within the areas targeted by this assay, and inadequate number of viral copies(<138 copies/mL). A negative result must be combined with clinical observations, patient history, and epidemiological information. The expected result is Negative.  Fact Sheet for Patients:  EntrepreneurPulse.com.au  Fact Sheet for Healthcare Providers:  IncredibleEmployment.be  This test is no t yet approved or cleared by the [place] FDA and  has been authorized for detection and/or diagnosis of SARS-CoV-2 by FDA under an Emergency Use Authorization (EUA). This EUA will remain  in effect (meaning this test can be used) for the duration of the COVID-19 declaration under Section 564(b)(1) of the Act, 21 U.S.C.section 360bbb-3(b)(1), unless the authorization is terminated  or revoked sooner.       Influenza A by PCR NEGATIVE NEGATIVE Final   Influenza B by PCR NEGATIVE NEGATIVE Final    Comment: (NOTE) The Xpert Xpress SARS-CoV-2/FLU/RSV plus assay is intended as an aid in the diagnosis of influenza from Nasopharyngeal swab specimens and should not be used as a sole basis for treatment. Nasal washings and aspirates are unacceptable for Xpert Xpress SARS-CoV-2/FLU/RSV testing.  Fact Sheet for Patients: EntrepreneurPulse.com.au  Fact Sheet for Healthcare Providers: IncredibleEmployment.be  This test is not yet approved or cleared by the [place] FDA and has been authorized for detection and/or diagnosis of SARS-CoV-2 by FDA under an Emergency Use Authorization (EUA). This EUA will remain in effect (meaning this test can be used) for the duration of  the COVID-19 declaration under Section 564(b)(1) of the Act, 21 U.S.C. section 360bbb-3(b)(1), unless the authorization is terminated or revoked.  Performed at [facility], [address], [place], [place] [address]          Impression:   1)Renal    ESRD. Patient is on peritoneal dialysis Patient is on CCPD Patient data in care everywhere was reviewed Here below is the data from care everywhere      2)HTN/hypertensive urgency Blood pressure is uncontrolled This is most likely secondary to nonadherence to his treatment  Patient is being started on his outpatient regimen  3)Anemia of chronic disease  CBC Latest Ref Rng & Units 05/11/2020 01/14/2020 02/26/2019  WBC 4.0 - 10.5 K/uL 10.2 12.2(H) 11.1(H)  Hemoglobin 13.0 - 17.0 g/dL 9.2(L) 11.6(L) 8.6(L)  Hematocrit 39.0 - 52.0 % 26.3(L) 32.6(L) 25.0(L)  Platelets 150 - 400 K/uL 166 215 203       HGb at goal (9--11) Patient is on Epogen as an outpatient  4) Secondary hyperparathyroidism -CKD Mineral-Bone Disorder    Lab Results  Component Value Date   PTH 315 (H) 05/11/2017   CALCIUM 8.6 (L) 05/11/2020  Secondary Hyperparathyroidism present Phosphorus is not at goal.   5) atrial fibrillation with RVR Patient is now clinically better   6) Electrolytes   BMP Latest Ref Rng & Units 05/11/2020 01/14/2020 02/26/2019  Glucose 70 - 99 mg/dL 139(H) 121(H) 98  BUN 8 - 23 mg/dL 59(H) 62(H) 67(H)  Creatinine 0.61 - 1.24 mg/dL 7.19(H) 7.60(H) 7.15(H)  Sodium 135 - 145 mmol/L 141 139 134(L)  Potassium 3.5 - 5.1 mmol/L 4.4 3.7 5.0  Chloride 98 - 111 mmol/L 105 102 104  CO2 22 - 32 mmol/L 22 23 19(L)  Calcium 8.9 - 10.3 mg/dL 8.6(L) 8.8(L) 8.8(L)     Sodium Normonatremic   Potassium Normokalemic    7)Acid base    Co2 at goal   8) Acute on chronic combined systolic and diastolic CHF  Patient last 2D echo done on 2020 showed  Left Ventricle: The left ventricle has mildly reduced  systolic function,  with an ejection fraction of 45-50%. The cavity size was normal. There is  mildly increased left ventricular wall thickness. Left ventricular  diastolic Doppler parameters are  consistent with impaired relaxation. Left ventricular diffuse hypokinesis.    Plan:  For ESRD We will keep patient on peritoneal dialysis  For acute on chronic combined systolic and diastolic CHF/fluid overload We will start patient on his actual PD regimen as patient has not been following this I reviewed patient to please do the treatment as per his prescription  For  anemia of chronic disease We will keep patient on Epogen   For hypertension We will suggest to start patient on his outpatient antihypertensive regimen  We will continue to follow    Micco Bourbeau s [facility] 05/11/2020, 6:02 PM

## 2020-05-11 NOTE — H&P (Signed)
History and Physical    [name] PXT:[id number] DOB: June 03, 1945 DOA: 05/11/2020  Referring MD/NP/PA:   PCP: Brett Mc, MD   Patient coming from:  The patient is coming from home.  At baseline, pt is independent for most of ADL.        Chief Complaint: SOB and palpitation  HPI: [name] is a 74 y.o. male with medical history significant of ESRD on peritoneal dialysis, HTN, HLD, DM, COPD, stroke, tobacco abuse, PAD, OSA, renal cell carcinoma, prostate cancer, CHF with EF of 40-45%, who presents with shortness of breath and palpitation.  Patient states that he has been having shortness breath in the past several days, which has been progressively worsening, particularly on exertion. Patient has chest pressure, denies chest pain. He has cough with clear mucus production. No fever or chills. He also reports palpitation and irregular heartbeat. Per EMS, patient had new A. fib with RVR, with heart rate 150-170s. Patient was given 5 mg of metoprolol, converted to sinus rhythm at arrival to ED. Currently heart rate 50s-70s. He reports performing his typical peritoneal dialysis this morning.  Patient denies nausea, vomiting, diarrhea, abdominal pain, symptoms of UTI. Patient is still making urine.   ED Course: pt was found to have troponin 21 -->105, WBC 10.2, BNP 3057, negative Covid PCR, potassium 4.4, bicarbonate 22, creatinine 7.19, BUN 59, temperature normal, blood pressure 217/86, heart rate 50s-70s, RR 20, oxygen saturation 95% on room air. Chest x-ray showed tiny left pleural effusion. Patient is placed on progressive benefit observation, Dr. Theador Torres of renal and Dr. Garen Torres of card are consulted.  Review of Systems:   General: no fevers, chills, no body weight gain, has fatigue HEENT: no blurry vision, hearing changes or sore throat Respiratory: has dyspnea, coughing, no wheezing CV: no chest pain, no palpitations GI: no nausea, vomiting, abdominal pain, diarrhea,  constipation GU: no dysuria, burning on urination, increased urinary frequency, hematuria  Ext: has leg edema Neuro: no unilateral weakness, numbness, or tingling, no vision change or hearing loss Skin: no rash, no skin tear. MSK: No muscle spasm, no deformity, no limitation of range of movement in spin Heme: No easy bruising.  Travel history: No recent long distant travel.  Allergy:  Allergies  Allergen Reactions  . Irbesartan Other (See Comments)    hyperkalemia  . Cymbalta [Duloxetine Hcl]   . Atorvastatin Other (See Comments)    Muscle pain  . Bystolic [Nebivolol Hcl]     Extreme fatigue     Past Medical History:  Diagnosis Date  . 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 Cardiomyopathy ([place])    a. 12/2018 Echo: EF 40-45%, global HK. Asc Ao 3.7cm; b. 01/2019 [place] MV: small, mild, fixed basal and mid antlat defect - scar vs artifact. Small, mild mid and apical inf minimally reversible defect, likely scar w/ peri-infarct ischemia. Coronary and Ao atherosclerosis; c. 12/2019 Echo: EF 40-45%, glob HK, Gr1 DD. Nl RV fxn. Sev dil LA. Mild MR. Mild-mod Ao sclerosis w/o stenosis. Asc Ao 35mm.  . Carotid arterial disease ([place])    a. 01/2020 RICA 1-39%, RCCA/RECA < 62%, LICA 7-03%, LCCA/LECA <50%.  . Claudication ([place])    a. 02/2020 ABI/TBI: R 1.08/0.95, L 1.00/0.70.  Marland Kitchen Complication of anesthesia    had to be woken up slowly as his bp was elevated when did this quickly  . Diabetes mellitus without complication ([place])   . ESRD (end stage renal  disease) ([place])    a. Peritoneal Dialysis pt.  . Hyperlipidemia   . Hypertension   . Migraine    cluster  . Neuromuscular disorder ([place])    left lower extrem neuropathy  . Obstructive sleep apnea    no OSA since had facial surgery with dr. Kathyrn Torres in 1997  . PAD (peripheral artery disease) [facility]) Feb 2009   nonobstructing, renal angiogram ([name])  . Renal cell carcinoma 2004   left kidney heminephrectomy  .  Renal insufficiency   . Stroke [facility])    a. 12/2019 MRI: Small acute infarcts involving the left cerebral hemisphere and several chronic infarcts.  Marland Kitchen TIA (transient ischemic attack)    no residual but left leg and foot still feel heavy  . tobacco abuse     Past Surgical History:  Procedure Laterality Date  . CAPD INSERTION N/A 03/01/2019   Procedure: LAPAROSCOPIC INSERTION CONTINUOUS AMBULATORY PERITONEAL DIALYSIS  (CAPD) CATHETER;  Surgeon: Brett Huxley, MD;  Location: [facility] ORS;  Service: Vascular;  Laterality: N/A;  . CARDIAC CATHETERIZATION     [name] did this to assess his renal artery  . cyst removal  12/25/2015   Spine L4 and L5  . heminephrectomy  2004   for renal cell CA  . RENAL CRYOABLATION  Jan 2006   right kidney,  [name]  . sciatica      Social History:  reports that he has been smoking cigarettes. He has a 50.00 pack-year smoking history. He has never used smokeless tobacco. He reports previous alcohol use. He reports that he does not use drugs.  Family History:  Family History  Problem Relation Age of Onset  . Hypertension Mother   . Cancer Mother        breast  . Aneurysm Mother   . Coronary artery disease Father   . Hypertension Father   . Stroke Father 71  . Heart disease Father   . Heart attack Father 13  . Aneurysm Maternal Grandmother        brain  . Aneurysm Paternal Grandmother        brain  . Coronary artery disease Paternal Grandfather   . Heart disease Brother        valvular heart disease  . COPD Brother   . Hypertension Brother   . Stroke Paternal Uncle      Prior to Admission medications   Medication Sig Start Date End Date Taking? Authorizing Provider  albuterol (VENTOLIN HFA) 108 (90 Base) MCG/ACT inhaler Inhale 2 puffs into the lungs every 6 (six) hours as needed for wheezing or shortness of breath. 04/12/19   Brett Mc, MD  amLODipine (NORVASC) 10 MG tablet TAKE 1 TABLET BY MOUTH ONCE DAILY 06/04/19   Brett Mc, MD   aspirin 81 MG tablet Take 1 tablet (81 mg total) by mouth daily. 05/16/14   Brett Mc, MD  calcitRIOL (ROCALTROL) 0.25 MCG capsule Take 0.25 mcg by mouth daily. Take one capsule only on Monday / Wednesday / FRIDAY 03/04/18   [provider]  carvedilol (COREG) 6.25 MG tablet Take 6.25 mg by mouth 2 (two) times daily with a meal.  07/07/17   [provider]  cloNIDine (CATAPRES) 0.2 MG tablet Take 0.2 mg by mouth 3 (three) times daily. 03/06/20   [provider]  clopidogrel (PLAVIX) 75 MG tablet Take 1 tablet (75 mg total) by mouth daily. 04/02/20   Theora Gianotti, NP  diphenhydrAMINE (BENADRYL) 25 MG tablet Take  25 mg by mouth every 6 (six) hours as needed.     [provider]  doxazosin (CARDURA) 8 MG tablet Take 8 mg by mouth daily. 03/29/20   [provider]  Dutasteride-Tamsulosin HCl 0.5-0.4 MG CAPS Take 1 capsule by mouth daily. 01/21/20   Brett Mc, MD  ezetimibe (ZETIA) 10 MG tablet Take 1 tablet (10 mg total) by mouth daily. 05/09/19 04/02/29  Theora Gianotti, NP  fluticasone (FLONASE) 50 MCG/ACT nasal spray Place 2 sprays into both nostrils daily. 05/29/18   Brett Mc, MD  gabapentin (NEURONTIN) 100 MG capsule TAKE 1 CAPSULE BY MOUTH 3 TIMES DAILY 10/15/19   Brett Mc, MD  gentamicin cream (GARAMYCIN) 0.1 % Apply 1 application topically daily. 01/15/20   Loletha Grayer, MD  isosorbide mononitrate (IMDUR) 30 MG 24 hr tablet Take 1 tablet (30 mg total) by mouth daily. 04/02/20 07/01/20  Theora Gianotti, NP  losartan (COZAAR) 100 MG tablet Take 100 mg by mouth daily. 04/11/19   [provider]  multivitamin (RENA-VIT) TABS tablet Take 1 tablet by mouth at bedtime. 12/21/19   [provider]  nicotine (NICODERM CQ - DOSED IN MG/24 HOURS) 14 mg/24hr patch 14 mg patch daily to chest wall, okay to substitute generic 01/15/20   Loletha Grayer, MD  rosuvastatin (CRESTOR) 20 MG tablet  Take 1 tablet (20 mg total) by mouth at bedtime. 04/02/20 04/02/21  Theora Gianotti, NP  sodium bicarbonate 325 MG tablet TK 1 T PO BID 02/14/18   [provider]    Physical Exam: Vitals:   05/11/20 1300 05/11/20 1347 05/11/20 1622 05/11/20 1727  BP: (!) 200/86 (!) 189/71 (!) 201/86 (!) 196/87  Pulse: 67 72 74 80  Resp: (!) 27 18 18 18   Temp:  97.9 F (36.6 C) 97.9 F (36.6 C) 97.9 F (36.6 C)  TempSrc:  Oral Oral Oral  SpO2: 97% 99% 99% 98%  Weight:      Height:       General: Not in acute distress HEENT:       Eyes: PERRL, EOMI, no scleral icterus.       ENT: No discharge from the ears and nose, no pharynx injection, no tonsillar enlargement.        Neck: No JVD, no bruit, no mass felt. Heme: No neck lymph node enlargement. Cardiac: S1/S2, RRR, No murmurs, No gallops or rubs. Respiratory: Has fine crackles bilaterally. GI: Soft, nondistended, nontender, no rebound pain, no organomegaly, BS present. GU: No hematuria Ext: 1+ leg edema bilaterally. 1+DP/PT pulse bilaterally. Musculoskeletal: No joint deformities, No joint redness or warmth, no limitation of ROM in spin. Skin: No rashes.  Neuro: Alert, oriented X3, cranial nerves II-XII grossly intact, moves all extremities normally.  Psych: Patient is not psychotic, no suicidal or hemocidal ideation.  Labs on Admission: I have personally reviewed following labs and imaging studies  CBC: Recent Labs  Lab 05/11/20 1042  WBC 10.2  HGB 9.2*  HCT 26.3*  MCV 90.7  PLT 858   Basic Metabolic Panel: Recent Labs  Lab 05/11/20 1042  NA 141  K 4.4  CL 105  CO2 22  GLUCOSE 139*  BUN 59*  CREATININE 7.19*  CALCIUM 8.6*  MG 2.2   GFR: Estimated Creatinine Clearance: 8.1 mL/min (A) (by C-G formula based on SCr of 7.19 mg/dL (H)). Liver Function Tests: No results for input(s): AST, ALT, ALKPHOS, BILITOT, PROT, ALBUMIN in the last 168 hours. No results for input(s): LIPASE,  AMYLASE in the last 168  hours. No results for input(s): AMMONIA in the last 168 hours. Coagulation Profile: No results for input(s): INR, PROTIME in the last 168 hours. Cardiac Enzymes: No results for input(s): CKTOTAL, CKMB, CKMBINDEX, TROPONINI in the last 168 hours. BNP (last 3 results) No results for input(s): PROBNP in the last 8760 hours. HbA1C: No results for input(s): HGBA1C in the last 72 hours. CBG: No results for input(s): GLUCAP in the last 168 hours. Lipid Profile: No results for input(s): CHOL, HDL, LDLCALC, TRIG, CHOLHDL, LDLDIRECT in the last 72 hours. Thyroid Function Tests: Recent Labs    05/11/20 1352  TSH 1.555   Anemia Panel: No results for input(s): VITAMINB12, FOLATE, FERRITIN, TIBC, IRON, RETICCTPCT in the last 72 hours. Urine analysis:    Component Value Date/Time   COLORURINE YELLOW 01/21/2020 [place] 01/21/2020 1223   APPEARANCEUR Clear 01/03/2012 1117   LABSPEC 1.010 01/21/2020 1223   LABSPEC 1.004 01/03/2012 1117   PHURINE 7.0 01/21/2020 1223   GLUCOSEU 100 (A) 01/21/2020 1223   HGBUR TRACE-INTACT (A) 01/21/2020 1223   BILIRUBINUR NEGATIVE 01/21/2020 1223   BILIRUBINUR neg 07/30/2013 1127   BILIRUBINUR Negative 01/03/2012 [place] 01/21/2020 1223   PROTEINUR 100 07/30/2013 1127   PROTEINUR Negative 01/03/2012 1117   PROTEINUR 30 (A) 06/30/2011 0822   UROBILINOGEN 0.2 01/21/2020 1223   NITRITE NEGATIVE 01/21/2020 1223   LEUKOCYTESUR NEGATIVE 01/21/2020 1223   LEUKOCYTESUR Negative 01/03/2012 1117   Sepsis Labs: @LABRCNTIP (procalcitonin:4,lacticidven:4) ) Recent Results (from the past 240 hour(s))  Resp Panel by RT-PCR (Flu A&B, Covid) Nasopharyngeal Swab     Status: None   Collection Time: 05/11/20 11:39 AM   Specimen: Nasopharyngeal Swab; Nasopharyngeal(NP) swabs in vial transport medium  Result Value Ref Range Status   SARS Coronavirus 2 by RT PCR NEGATIVE NEGATIVE Final    Comment: (NOTE) SARS-CoV-2 target nucleic acids  are NOT DETECTED.  The SARS-CoV-2 RNA is generally detectable in upper respiratory specimens during the acute phase of infection. The lowest concentration of SARS-CoV-2 viral copies this assay can detect is 138 copies/mL. A negative result does not preclude SARS-Cov-2 infection and should not be used as the sole basis for treatment or other patient management decisions. A negative result may occur with  improper specimen collection/handling, submission of specimen other than nasopharyngeal swab, presence of viral mutation(s) within the areas targeted by this assay, and inadequate number of viral copies(<138 copies/mL). A negative result must be combined with clinical observations, patient history, and epidemiological information. The expected result is Negative.  Fact Sheet for Patients:  EntrepreneurPulse.com.au  Fact Sheet for Healthcare Providers:  IncredibleEmployment.be  This test is no t yet approved or cleared by the [place] FDA and  has been authorized for detection and/or diagnosis of SARS-CoV-2 by FDA under an Emergency Use Authorization (EUA). This EUA will remain  in effect (meaning this test can be used) for the duration of the COVID-19 declaration under Section 564(b)(1) of the Act, 21 U.S.C.section 360bbb-3(b)(1), unless the authorization is terminated  or revoked sooner.       Influenza A by PCR NEGATIVE NEGATIVE Final   Influenza B by PCR NEGATIVE NEGATIVE Final    Comment: (NOTE) The Xpert Xpress SARS-CoV-2/FLU/RSV plus assay is intended as an aid in the diagnosis of influenza from Nasopharyngeal swab specimens and should not be used as a sole basis for treatment. Nasal washings and aspirates are unacceptable for Xpert Xpress SARS-CoV-2/FLU/RSV testing.  Fact Sheet  for Patients: EntrepreneurPulse.com.au  Fact Sheet for Healthcare Providers: IncredibleEmployment.be  This test is  not yet approved or cleared by the [place] FDA and has been authorized for detection and/or diagnosis of SARS-CoV-2 by FDA under an Emergency Use Authorization (EUA). This EUA will remain in effect (meaning this test can be used) for the duration of the COVID-19 declaration under Section 564(b)(1) of the Act, 21 U.S.C. section 360bbb-3(b)(1), unless the authorization is terminated or revoked.  Performed at [facility], [address], [place], [place] [address]      Radiological Exams on Admission: DG Chest Portable 1 View  Result Date: 05/11/2020 CLINICAL DATA:  Shortness of breath. History of CHF. Evaluate for infiltrates. EXAM: PORTABLE CHEST 1 VIEW COMPARISON:  April 04, 2019 FINDINGS: No pneumothorax. Stable cardiomegaly. The hila and mediastinum are unremarkable. No overt edema. No suspicious infiltrate. Possible tiny left effusion with atelectasis. IMPRESSION: Possible tiny left effusion with mild atelectasis.  No overt edema. Electronically Signed   By: Dorise Bullion III M.D   On: 05/11/2020 12:18     EKG: I have personally reviewed. Sinus rhythm, QTC 503, LAD, ST depression in lateral leads, poor R wave progression, anteroseptal infarction pattern.  Assessment/Plan Principal Problem:   Hypertensive urgency Active Problems:   Hyperlipidemia   tobacco abuse   Prostate cancer (HCC)   DM (diabetes mellitus), type 2 with renal complications (HCC)   Accelerated hypertension   Stroke (HCC)   End stage renal disease (HCC)   Elevated troponin   Acute on chronic systolic congestive heart failure (HCC)   Fluid overload   Hypertensive urgency, fluid overload and acute on chronic systolic congestive heart failure: Patient has 1+ leg edema, crackles on auscultation, consistent with fluid overload and CHF exacerbation.  2D echo on 01/17/2019 showed EF of 40-45%.  BNP 3057.  Dr. Theador Torres of nephrology is consulted for dialysis.  -Admit to progressive bed for  observation -Blood pressure control: Coreg, clonidine, Cardura, Imdur and IV hydralazine as needed -Volume management by dialysis per renal -Bronchodilators -Repeat 2D echo  Accelerated hypertension: Blood pressure 217/86.  -see above  Hyperlipidemia -zetia and Crestor  Tobacco abuse -Nicotine patch  Hx of Prostate cancer ([place]) -on Cardura  Diet controled DM (diabetes mellitus), type 2 with renal complications [facility]): Recent A1c 6.2, well controlled.  Patient is not taking medications.  Blood sugar 139 on admission. -No med needed  Stroke ([place]) -Aspirin, Plavix, Crestor, Zetia  End stage renal disease on peritoneal dialysis -Dr. Theador Torres of nephrology is consulted  Elevated troponin: trop 21 -->105.  Patient has a chest pressure, but no chest pain.  EKG showed ST depression in lateral leads.  Possibly due to demand ischemia. Dr. Garen Torres of card is consulted. -Aspirin, Crestor, Zetia, Coreg, Imdur -Trend troponin -Check UDS, A1c, FLP -Follow-up 2D echo  Possible A fib with RVR:  Per EMS, patient had new A. fib with RVR, with heart rate 150-170s. Patient was given 5 mg of metoprolol, converted to sinus rhythm at arrival to ED. Currently heart rate 50s-70s. -f/u cardiologist recommendation -Patient is on Coreg    DVT ppx: SQ Heparin       Code Status: Full code (discussed with pt) Family Communication:   Yes, patient's wife by phone Disposition Plan:  Anticipate discharge back to previous environment Consults called:  Dr. Theador Torres of renal Admission status:  progressive unit for obs   Status is: Observation  The patient remains OBS appropriate and will d/c before 2 midnights.  Dispo: The patient is from: Home              Anticipated d/c is to: Home              Anticipated d/c date is: 1 day              Patient currently is not medically stable to d/c.          Date of Service 05/11/2020    [facility]   If 7PM-7AM, please  contact night-coverage [email or web address] 05/11/2020, 5:29 PM

## 2020-05-11 NOTE — ED Triage Notes (Signed)
Pt reports he woke up this am and has some SOB and it felt like his heart was beating irregular. Pt reports some tightness in his chest as well.

## 2020-05-11 NOTE — ED Triage Notes (Signed)
Pt in via EMS from home with c/o sob and irregular HR  EMS reports new onset of a-fib. EMS reports pt has been SOB over the last few weeks that has continued to get worse. Pt on oxygen at this time. HR 150-170, #18 left FA, MS administered 5mg  of lopressor. Pt in and out of a-fib.

## 2020-05-11 NOTE — ED Notes (Signed)
Patient reports, "I feel like I did before I came here.  CO shortness of breath.  Secure message sent to Admitting MD.  Concerned  About giving Albuterol with a HR of 130-152. Assured patient I was working on some relief for him

## 2020-05-11 NOTE — ED Provider Notes (Signed)
[facility] Emergency Department Provider Note ____________________________________________   Event Date/Time   First MD Initiated Contact with Patient 05/11/20 1119     (approximate)  I have reviewed the triage vital signs and the nursing notes.  HISTORY  Chief Complaint Shortness of Breath   HPI [name] is a 74 y.o. malewho presents to the ED for evaluation of shortness of breath.  Chart review indicates patient follows with [facility] cardiology.  History of ESRD on peritoneal dialysis.  CHF with EF of 40%.  HTN, HLD, PAD, sleep apnea and renal cell carcinoma s/p left nephrectomy.  Tobacco abuse, CVA on DAPT.  Adenocarcinoma of the appendix.  Dissection of left subclavian artery.  Patient presents from home.  He reports performing his typical peritoneal dialysis this morning.  Denies taking any Lasix or other fluid pills, but thinks he may have been on it previously.  He reports he still makes urine, "and a lot of it."  Patient presents to the ED with a few days of progressively worsening orthopnea, dyspnea on exertion, chest pressure on exertion and intermittent sensation of heart palpitations.  He denies chest pain at rest, syncope, fevers, productive cough, abdominal pain, dysuria or diarrhea.  Patient reports walking to the store this morning to get his typical morning coffee, experiencing exertional chest pain or shortness of breath, feel extremely weak and tired when he got home and so called 911 for evaluation.  EMS reports find the patient in A. fib with RVR with rates in the 150s, for which they provided 5 mg of IV metoprolol with improvement of his rates.  He presents to the ED in a sinus rhythm and reports feeling better.  Past Medical History:  Diagnosis Date  . Adenocarcinoma of appendix [facility]) Jan 2006   right kidney, s/p cryoablation  . Cardiomyopathy ([place])    a. 12/2018 Echo: EF 40-45%, global HK. Asc Ao 3.7cm; b. 01/2019 Lexi MV: small,  mild, fixed basal and mid antlat defect - scar vs artifact. Small, mild mid and apical inf minimally reversible defect, likely scar w/ peri-infarct ischemia. Coronary and Ao atherosclerosis; c. 12/2019 Echo: EF 40-45%, glob HK, Gr1 DD. Nl RV fxn. Sev dil LA. Mild MR. Mild-mod Ao sclerosis w/o stenosis. Asc Ao 71mm.  . Carotid arterial disease ([place])    a. 01/2020 RICA 1-39%, RCCA/RECA < 71%, LICA 0-62%, LCCA/LECA <50%.  . Claudication ([place])    a. 02/2020 ABI/TBI: R 1.08/0.95, L 1.00/0.70.  Marland Kitchen Complication of anesthesia    had to be woken up slowly as his bp was elevated when did this quickly  . Diabetes mellitus without complication ([place])   . ESRD (end stage renal disease) ([place])    a. Peritoneal Dialysis pt.  . Hyperlipidemia   . Hypertension   . Migraine    cluster  . Neuromuscular disorder ([place])    left lower extrem neuropathy  . Obstructive sleep apnea    no OSA since had facial surgery with dr. Kathyrn Sheriff in 1997  . PAD (peripheral artery disease) [facility]) Feb 2009   nonobstructing, renal angiogram ([place])  . Renal cell carcinoma 2004   left kidney heminephrectomy  . Renal insufficiency   . Stroke [facility])    a. 12/2019 MRI: Small acute infarcts involving the left cerebral hemisphere and several chronic infarcts.  Marland Kitchen TIA (transient ischemic attack)    no residual but left leg and foot still feel heavy  . tobacco abuse     Patient Active Problem List  Diagnosis Date Noted  . End stage renal disease ([place]) 02/19/2020  . Aortic arch atherosclerosis ([place]) 01/22/2020  . Hospital discharge follow-up 01/22/2020  . Urinary retention 01/22/2020  . Neurologic deficit as late effect of ischemic cerebrovascular accident (CVA) 01/15/2020  . Dissection of left subclavian artery ([place])   . Stroke ([place]) 01/14/2020  . Constipation 05/13/2019  . Cardiomyopathy ([place]) 04/26/2019  . Coronary artery disease involving native coronary artery of native heart without angina pectoris 04/26/2019  . ESRD (end stage  renal disease) ([place]) 04/26/2019  . Chronic pain not due to malignancy 04/12/2019  . Chronic bilateral low back pain with bilateral sciatica 11/27/2018  . Synovial cyst of lumbar spine 11/27/2018  . Accelerated hypertension 11/14/2018  . CKD (chronic kidney disease), stage V ([place]) 11/14/2018  . Major depressive disorder with current active episode 07/26/2017  . Anemia of chronic kidney failure, stage 4 (severe) ([place]) 07/26/2017  . COPD exacerbation ([place]) 07/26/2017  . CKD (chronic kidney disease), stage IV ([place]) 11/25/2016  . DOE (dyspnea on exertion) 11/25/2016  . Sciatica of right side 07/20/2015  . DM (diabetes mellitus), type 2 with renal complications ([place]) 43/15/4008  . S/P UVPP (uvulopalatopharyngoplasty) 08/22/2014  . Presbyacusis 08/22/2014  . Adenocarcinoma, renal cell ([place]) 02/26/2014  . Renal cell carcinoma ([place]) 02/26/2014  . Malignant neoplasm of kidney excluding renal pelvis ([place]) 02/26/2014  . Peripheral arterial occlusive disease ([place]) 02/26/2014  . Elevated alkaline phosphatase measurement 07/30/2013  . Prostate CA ([place]) 07/30/2013  . Prostate cancer ([place]) 07/30/2013  . Elevated serum alkaline phosphatase level 07/30/2013  . Malignant neoplasm of prostate ([place]) 07/30/2013  . Statin intolerance 04/26/2013  . Hypertensive renal sclerosis with hypertension 10/18/2012  . Renal sclerosis with hypertension 10/18/2012  . Complication of diabetes mellitus ([place]) 07/16/2012  . PAD (peripheral artery disease) ([place])   . Left hip pain 04/20/2012  . Erectile dysfunction 04/20/2012  . Chronic kidney disease, stage III (moderate) ([place]) 04/20/2012  . Hip pain 04/20/2012  . tobacco abuse   . TIA (transient ischemic attack) 01/15/2012  . Temporary cerebral vascular dysfunction 01/15/2012  . Tobacco abuse counseling 05/06/2011  . Counseling on substance use and abuse 05/06/2011  . Hyperlipidemia   . History of renal cell carcinoma     Past Surgical History:  Procedure  Laterality Date  . CAPD INSERTION N/A 03/01/2019   Procedure: LAPAROSCOPIC INSERTION CONTINUOUS AMBULATORY PERITONEAL DIALYSIS  (CAPD) CATHETER;  Surgeon: Algernon Huxley, MD;  Location: [facility] ORS;  Service: Vascular;  Laterality: N/A;  . CARDIAC CATHETERIZATION     Dr. [name] did this to assess his renal artery  . cyst removal  12/25/2015   Spine L4 and L5  . heminephrectomy  2004   for renal cell CA  . RENAL CRYOABLATION  Jan 2006   right kidney,  [name]  . sciatica      Prior to Admission medications   Medication Sig Start Date End Date Taking? Authorizing Provider  albuterol (VENTOLIN HFA) 108 (90 Base) MCG/ACT inhaler Inhale 2 puffs into the lungs every 6 (six) hours as needed for wheezing or shortness of breath. 04/12/19   Crecencio Mc, MD  amLODipine (NORVASC) 10 MG tablet TAKE 1 TABLET BY MOUTH ONCE DAILY 06/04/19   Crecencio Mc, MD  aspirin 81 MG tablet Take 1 tablet (81 mg total) by mouth daily. 05/16/14   Crecencio Mc, MD  calcitRIOL (ROCALTROL) 0.25 MCG capsule Take 0.25 mcg by mouth daily. Take one capsule only on Monday / Wednesday /  FRIDAY 03/04/18   [provider]  carvedilol (COREG) 6.25 MG tablet Take 6.25 mg by mouth 2 (two) times daily with a meal.  07/07/17   [provider]  cloNIDine (CATAPRES) 0.2 MG tablet Take 0.2 mg by mouth 3 (three) times daily. 03/06/20   [provider]  clopidogrel (PLAVIX) 75 MG tablet Take 1 tablet (75 mg total) by mouth daily. 04/02/20   Theora Gianotti, NP  diphenhydrAMINE (BENADRYL) 25 MG tablet Take 25 mg by mouth every 6 (six) hours as needed.     [provider]  doxazosin (CARDURA) 8 MG tablet Take 8 mg by mouth daily. 03/29/20   [provider]  Dutasteride-Tamsulosin HCl 0.5-0.4 MG CAPS Take 1 capsule by mouth daily. 01/21/20   Crecencio Mc, MD  ezetimibe (ZETIA) 10 MG tablet Take 1 tablet (10 mg total) by mouth daily. 05/09/19 04/02/29  Theora Gianotti, NP   fluticasone (FLONASE) 50 MCG/ACT nasal spray Place 2 sprays into both nostrils daily. 05/29/18   Crecencio Mc, MD  gabapentin (NEURONTIN) 100 MG capsule TAKE 1 CAPSULE BY MOUTH 3 TIMES DAILY 10/15/19   Crecencio Mc, MD  gentamicin cream (GARAMYCIN) 0.1 % Apply 1 application topically daily. 01/15/20   Loletha Grayer, MD  isosorbide mononitrate (IMDUR) 30 MG 24 hr tablet Take 1 tablet (30 mg total) by mouth daily. 04/02/20 07/01/20  Theora Gianotti, NP  losartan (COZAAR) 100 MG tablet Take 100 mg by mouth daily. 04/11/19   [provider]  multivitamin (RENA-VIT) TABS tablet Take 1 tablet by mouth at bedtime. 12/21/19   [provider]  nicotine (NICODERM CQ - DOSED IN MG/24 HOURS) 14 mg/24hr patch 14 mg patch daily to chest wall, okay to substitute generic 01/15/20   Loletha Grayer, MD  rosuvastatin (CRESTOR) 20 MG tablet Take 1 tablet (20 mg total) by mouth at bedtime. 04/02/20 04/02/21  Theora Gianotti, NP  sodium bicarbonate 325 MG tablet TK 1 T PO BID 02/14/18   [provider]    Allergies Irbesartan, Cymbalta [duloxetine hcl], Atorvastatin, and Bystolic [nebivolol hcl]  Family History  Problem Relation Age of Onset  . Hypertension Mother   . Cancer Mother        breast  . Aneurysm Mother   . Coronary artery disease Father   . Hypertension Father   . Stroke Father 59  . Heart disease Father   . Heart attack Father 13  . Aneurysm Maternal Grandmother        brain  . Aneurysm Paternal Grandmother        brain  . Coronary artery disease Paternal Grandfather   . Heart disease Brother        valvular heart disease  . COPD Brother   . Hypertension Brother   . Stroke Paternal Uncle     Social History Social History   Tobacco Use  . Smoking status: Current Every Day Smoker    Packs/day: 1.00    Years: 50.00    Pack years: 50.00    Types: Cigarettes  . Smokeless tobacco: Never Used  Vaping Use  . Vaping Use: Former  .  Devices: tried but did not like  Substance Use Topics  . Alcohol use: Not Currently  . Drug use: No    Review of Systems  Constitutional: No fever/chills.  Positive for generalized weakness Eyes: No visual changes. ENT: No sore throat. Cardiovascular: Positive for palpitations and chest pain. Respiratory: Positive for shortness of breath  and orthopnea. Gastrointestinal: No abdominal pain.  No nausea, no vomiting.  No diarrhea.  No constipation. Genitourinary: Negative for dysuria. Musculoskeletal: Negative for back pain. Skin: Negative for rash. Neurological: Negative for headaches, focal weakness or numbness.  ____________________________________________   PHYSICAL EXAM:  VITAL SIGNS: Vitals:   05/11/20 1140 05/11/20 1230  BP:  (!) 217/86  Pulse:  71  Resp:  18  Temp:    SpO2: 98% 95%     Constitutional: Alert and oriented. Well appearing and in no acute distress.  Sitting upright in bed, pleasant and conversational. Eyes: Conjunctivae are normal. PERRL. EOMI. Head: Atraumatic. Nose: No congestion/rhinnorhea. Mouth/Throat: Mucous membranes are moist.  Oropharynx non-erythematous. Neck: No stridor. No cervical spine tenderness to palpation. Cardiovascular: Normal rate, regular rhythm. Grossly normal heart sounds.  Good peripheral circulation. Respiratory: Mild tachypnea to the low 20s.  No further evidence of distress.  Faint bibasilar crackles Gastrointestinal: Soft , nondistended, nontender to palpation. No CVA tenderness. PD catheter in place, no surrounding erythema or purulence..  Benign abdomen throughout. Musculoskeletal: No lower extremity tenderness.  No joint effusions. No signs of acute trauma. Pitting edema to bilateral lower extremities symmetrically without overlying skin changes or signs of trauma. Neurologic:  Normal speech and language. No gross focal neurologic deficits are appreciated. No gait instability noted. Skin:  Skin is warm, dry and intact. No  rash noted. Psychiatric: Mood and affect are normal. Speech and behavior are normal.  ____________________________________________   LABS (all labs ordered are listed, but only abnormal results are displayed)  Labs Reviewed  BASIC METABOLIC PANEL - Abnormal; Notable for the following components:      Result Value   Glucose, Bld 139 (*)    BUN 59 (*)    Creatinine, Ser 7.19 (*)    Calcium 8.6 (*)    GFR, Estimated 7 (*)    All other components within normal limits  CBC - Abnormal; Notable for the following components:   RBC 2.90 (*)    Hemoglobin 9.2 (*)    HCT 26.3 (*)    All other components within normal limits  BRAIN NATRIURETIC PEPTIDE - Abnormal; Notable for the following components:   B Natriuretic Peptide 3,057.7 (*)    All other components within normal limits  TROPONIN I (HIGH SENSITIVITY) - Abnormal; Notable for the following components:   Troponin I (High Sensitivity) 21 (*)    All other components within normal limits  RESP PANEL BY RT-PCR (FLU A&B, COVID) ARPGX2  MAGNESIUM  TROPONIN I (HIGH SENSITIVITY)   ____________________________________________  12 Lead EKG  Sinus rhythm, Rate of 63 bpm.  Leftward axis.  Incomplete right bundle branch block.  Submillimeter inferior ST depressions without STEMI criteria, these were not present on EKG from August 2021. ____________________________________________  RADIOLOGY  ED MD interpretation: 1 view CXR reviewed by me with pulmonary vascular congestion.  Official radiology report(s): DG Chest Portable 1 View  Result Date: 05/11/2020 CLINICAL DATA:  Shortness of breath. History of CHF. Evaluate for infiltrates. EXAM: PORTABLE CHEST 1 VIEW COMPARISON:  April 04, 2019 FINDINGS: No pneumothorax. Stable cardiomegaly. The hila and mediastinum are unremarkable. No overt edema. No suspicious infiltrate. Possible tiny left effusion with atelectasis. IMPRESSION: Possible tiny left effusion with mild atelectasis.  No overt  edema. Electronically Signed   By: Dorise Bullion III M.D   On: 05/11/2020 12:18    ____________________________________________   PROCEDURES and INTERVENTIONS  Procedure(s) performed (including Critical Care):  .1-3 Lead EKG Interpretation Performed by: Vladimir Crofts,  MD Authorized by: Vladimir Crofts, MD     Interpretation: normal     ECG rate:  64   ECG rate assessment: normal     Rhythm: sinus rhythm     Ectopy: PVCs     Conduction: normal      Medications  nitroGLYCERIN (NITROGLYN) 2 % ointment 0.5 inch (has no administration in time range)  furosemide (LASIX) injection 40 mg (40 mg Intravenous Given 05/11/20 1201)    ____________________________________________   MDM / ED COURSE   74 year old male with history of reduced ejection fraction but not on diuretics, ESRD on home PD, presents to the ED with evidence of a CHF exacerbation requiring medical admission for diuresis and BP control.  Patient presented hypertensive, increasing while he is here to systolics go to the 842 necessitating nitroglycerin paste, otherwise normal vitals on room air.  He was noted be tachycardic with EMS and they describe A. fib with RVR, but do not provide rhythm strips, and report resolution of this after 5 mg of IV metoprolol.  Patient blood work demonstrates elevated BNP suggestive of acute CHF exacerbation.  CXR appears congested to me without discrete lobar filtration to suggest pneumonia.  His EKG demonstrates a sinus rhythm, with new ischemic changes with ST depressions, but no STEMI criteria.  There is likely strain in the setting of his respiratory complaints.  We will initiate diuresis with 40 mg of IV Lasix and provide nitroglycerin paste to facilitate BP lowering and vasodilation.  Will admit to hospitalist medicine for further work-up and management.   Clinical Course as of 05/11/20 1322  Sun May 11, 2020  1300 RN informs me of hypertension with systolics greater than 103.  We will  start Nitropaste [DS]    Clinical Course User Index [DS] Vladimir Crofts, MD    ____________________________________________   FINAL CLINICAL IMPRESSION(S) / ED DIAGNOSES  Final diagnoses:  Acute on chronic systolic congestive heart failure (HCC)  SOB (shortness of breath)  Orthopnea  Hypertensive urgency     ED Discharge Orders    None       Harlee Eckroth   Note:  This document was prepared using Dragon voice recognition software and may include unintentional dictation errors.   Vladimir Crofts, MD 05/11/20 1324

## 2020-05-12 ENCOUNTER — Encounter: Payer: Self-pay | Admitting: Hospitalist

## 2020-05-12 ENCOUNTER — Observation Stay (HOSPITAL_COMMUNITY)
Admit: 2020-05-12 | Discharge: 2020-05-12 | Disposition: A | Discharge status: Home / Self Care | Payer: Medicare Other | Attending: Physician Assistant | Admitting: Physician Assistant

## 2020-05-12 DIAGNOSIS — I5022 Chronic systolic (congestive) heart failure: Secondary | ICD-10-CM | POA: Diagnosis not present

## 2020-05-12 DIAGNOSIS — I110 Hypertensive heart disease with heart failure: Secondary | ICD-10-CM | POA: Diagnosis not present

## 2020-05-12 DIAGNOSIS — N186 End stage renal disease: Secondary | ICD-10-CM | POA: Diagnosis present

## 2020-05-12 DIAGNOSIS — F17210 Nicotine dependence, cigarettes, uncomplicated: Secondary | ICD-10-CM | POA: Diagnosis present

## 2020-05-12 DIAGNOSIS — Z720 Tobacco use: Secondary | ICD-10-CM

## 2020-05-12 DIAGNOSIS — I160 Hypertensive urgency: Secondary | ICD-10-CM | POA: Diagnosis present

## 2020-05-12 DIAGNOSIS — D638 Anemia in other chronic diseases classified elsewhere: Secondary | ICD-10-CM | POA: Diagnosis not present

## 2020-05-12 DIAGNOSIS — Z8673 Personal history of transient ischemic attack (TIA), and cerebral infarction without residual deficits: Secondary | ICD-10-CM

## 2020-05-12 DIAGNOSIS — Z79899 Other long term (current) drug therapy: Secondary | ICD-10-CM | POA: Diagnosis not present

## 2020-05-12 DIAGNOSIS — Z85528 Personal history of other malignant neoplasm of kidney: Secondary | ICD-10-CM | POA: Diagnosis not present

## 2020-05-12 DIAGNOSIS — N2581 Secondary hyperparathyroidism of renal origin: Secondary | ICD-10-CM | POA: Diagnosis present

## 2020-05-12 DIAGNOSIS — I351 Nonrheumatic aortic (valve) insufficiency: Secondary | ICD-10-CM

## 2020-05-12 DIAGNOSIS — I471 Supraventricular tachycardia: Secondary | ICD-10-CM | POA: Diagnosis present

## 2020-05-12 DIAGNOSIS — I25110 Atherosclerotic heart disease of native coronary artery with unstable angina pectoris: Secondary | ICD-10-CM | POA: Diagnosis present

## 2020-05-12 DIAGNOSIS — I10 Essential (primary) hypertension: Secondary | ICD-10-CM | POA: Diagnosis not present

## 2020-05-12 DIAGNOSIS — E785 Hyperlipidemia, unspecified: Secondary | ICD-10-CM | POA: Diagnosis not present

## 2020-05-12 DIAGNOSIS — R0602 Shortness of breath: Secondary | ICD-10-CM

## 2020-05-12 DIAGNOSIS — Z7982 Long term (current) use of aspirin: Secondary | ICD-10-CM | POA: Diagnosis not present

## 2020-05-12 DIAGNOSIS — I132 Hypertensive heart and chronic kidney disease with heart failure and with stage 5 chronic kidney disease, or end stage renal disease: Secondary | ICD-10-CM | POA: Diagnosis present

## 2020-05-12 DIAGNOSIS — I700 Atherosclerosis of aorta: Secondary | ICD-10-CM | POA: Diagnosis present

## 2020-05-12 DIAGNOSIS — D509 Iron deficiency anemia, unspecified: Secondary | ICD-10-CM | POA: Diagnosis not present

## 2020-05-12 DIAGNOSIS — I5020 Unspecified systolic (congestive) heart failure: Secondary | ICD-10-CM | POA: Diagnosis not present

## 2020-05-12 DIAGNOSIS — R778 Other specified abnormalities of plasma proteins: Secondary | ICD-10-CM | POA: Diagnosis not present

## 2020-05-12 DIAGNOSIS — I340 Nonrheumatic mitral (valve) insufficiency: Secondary | ICD-10-CM | POA: Diagnosis not present

## 2020-05-12 DIAGNOSIS — J449 Chronic obstructive pulmonary disease, unspecified: Secondary | ICD-10-CM | POA: Diagnosis present

## 2020-05-12 DIAGNOSIS — Z20822 Contact with and (suspected) exposure to covid-19: Secondary | ICD-10-CM | POA: Diagnosis present

## 2020-05-12 DIAGNOSIS — I493 Ventricular premature depolarization: Secondary | ICD-10-CM | POA: Diagnosis present

## 2020-05-12 DIAGNOSIS — I5043 Acute on chronic combined systolic (congestive) and diastolic (congestive) heart failure: Secondary | ICD-10-CM | POA: Diagnosis present

## 2020-05-12 DIAGNOSIS — G4733 Obstructive sleep apnea (adult) (pediatric): Secondary | ICD-10-CM | POA: Diagnosis present

## 2020-05-12 DIAGNOSIS — R0601 Orthopnea: Secondary | ICD-10-CM | POA: Diagnosis not present

## 2020-05-12 DIAGNOSIS — E1151 Type 2 diabetes mellitus with diabetic peripheral angiopathy without gangrene: Secondary | ICD-10-CM | POA: Diagnosis present

## 2020-05-12 DIAGNOSIS — Z7902 Long term (current) use of antithrombotics/antiplatelets: Secondary | ICD-10-CM | POA: Diagnosis not present

## 2020-05-12 DIAGNOSIS — E1122 Type 2 diabetes mellitus with diabetic chronic kidney disease: Secondary | ICD-10-CM | POA: Diagnosis present

## 2020-05-12 DIAGNOSIS — I4891 Unspecified atrial fibrillation: Secondary | ICD-10-CM | POA: Diagnosis present

## 2020-05-12 DIAGNOSIS — Z992 Dependence on renal dialysis: Secondary | ICD-10-CM | POA: Diagnosis not present

## 2020-05-12 DIAGNOSIS — I248 Other forms of acute ischemic heart disease: Secondary | ICD-10-CM | POA: Diagnosis present

## 2020-05-12 DIAGNOSIS — I5023 Acute on chronic systolic (congestive) heart failure: Secondary | ICD-10-CM | POA: Diagnosis not present

## 2020-05-12 DIAGNOSIS — E1169 Type 2 diabetes mellitus with other specified complication: Secondary | ICD-10-CM | POA: Diagnosis present

## 2020-05-12 DIAGNOSIS — Z825 Family history of asthma and other chronic lower respiratory diseases: Secondary | ICD-10-CM | POA: Diagnosis not present

## 2020-05-12 DIAGNOSIS — D631 Anemia in chronic kidney disease: Secondary | ICD-10-CM | POA: Diagnosis not present

## 2020-05-12 DIAGNOSIS — I480 Paroxysmal atrial fibrillation: Secondary | ICD-10-CM | POA: Diagnosis not present

## 2020-05-12 HISTORY — DX: Unspecified atrial fibrillation: I48.91

## 2020-05-12 LAB — CBC
HCT: 25.7 % — ABNORMAL LOW (ref 39.0–52.0)
HCT: 25.4 % — ABNORMAL LOW (ref 39.0–52.0)
Hemoglobin: 9 g/dL — ABNORMAL LOW (ref 13.0–17.0)
Hemoglobin: 8.7 g/dL — ABNORMAL LOW (ref 13.0–17.0)
MCH: 31.5 pg (ref 26.0–34.0)
MCH: 31 pg (ref 26.0–34.0)
MCHC: 35 g/dL (ref 30.0–36.0)
MCHC: 34.3 g/dL (ref 30.0–36.0)
MCV: 89.9 fL (ref 80.0–100.0)
MCV: 90.4 fL (ref 80.0–100.0)
Platelets: 147 10*3/uL — ABNORMAL LOW (ref 150–400)
Platelets: 143 10*3/uL — ABNORMAL LOW (ref 150–400)
RBC: 2.86 MIL/uL — ABNORMAL LOW (ref 4.22–5.81)
RBC: 2.81 MIL/uL — ABNORMAL LOW (ref 4.22–5.81)
RDW: 13.7 % (ref 11.5–15.5)
RDW: 13.9 % (ref 11.5–15.5)
WBC: 11 10*3/uL — ABNORMAL HIGH (ref 4.0–10.5)
WBC: 8.9 10*3/uL (ref 4.0–10.5)
nRBC: 0 % (ref 0.0–0.2)
nRBC: 0 % (ref 0.0–0.2)

## 2020-05-12 LAB — ECHOCARDIOGRAM COMPLETE
AR max vel: 3.77 cm2
AV Area VTI: 4.25 cm2
AV Area mean vel: 3.9 cm2
AV Mean grad: 5 mmHg
AV Peak grad: 9.6 mmHg
Ao pk vel: 1.55 m/s
Area-P 1/2: 3.39 cm2
Height: 66 in
P 1/2 time: 573 msec
S' Lateral: 4.54 cm
Weight: 2272 oz

## 2020-05-12 LAB — BASIC METABOLIC PANEL
Anion gap: 12 (ref 5–15)
Anion gap: 11 (ref 5–15)
BUN: 60 mg/dL — ABNORMAL HIGH (ref 8–23)
BUN: 58 mg/dL — ABNORMAL HIGH (ref 8–23)
CO2: 20 mmol/L — ABNORMAL LOW (ref 22–32)
CO2: 21 mmol/L — ABNORMAL LOW (ref 22–32)
Calcium: 8.7 mg/dL — ABNORMAL LOW (ref 8.9–10.3)
Calcium: 8.5 mg/dL — ABNORMAL LOW (ref 8.9–10.3)
Chloride: 109 mmol/L (ref 98–111)
Chloride: 108 mmol/L (ref 98–111)
Creatinine, Ser: 7.5 mg/dL — ABNORMAL HIGH (ref 0.61–1.24)
Creatinine, Ser: 7.41 mg/dL — ABNORMAL HIGH (ref 0.61–1.24)
GFR, Estimated: 7 mL/min — ABNORMAL LOW (ref >60)
GFR, Estimated: 7 mL/min — ABNORMAL LOW (ref >60)
Glucose, Bld: 145 mg/dL — ABNORMAL HIGH (ref 70–99)
Glucose, Bld: 154 mg/dL — ABNORMAL HIGH (ref 70–99)
Potassium: 3.8 mmol/L (ref 3.5–5.1)
Potassium: 4.1 mmol/L (ref 3.5–5.1)
Sodium: 141 mmol/L (ref 135–145)
Sodium: 140 mmol/L (ref 135–145)

## 2020-05-12 LAB — TROPONIN I (HIGH SENSITIVITY)
Troponin I (High Sensitivity): 247 ng/L (ref <18)
Troponin I (High Sensitivity): 319 ng/L (ref <18)

## 2020-05-12 LAB — MAGNESIUM: Magnesium: 2.2 mg/dL (ref 1.7–2.4)

## 2020-05-12 LAB — LIPID PANEL
Cholesterol: 122 mg/dL (ref 0–200)
HDL: 25 mg/dL — ABNORMAL LOW (ref >40)
LDL Cholesterol: 61 mg/dL (ref 0–99)
Total CHOL/HDL Ratio: 4.9 RATIO
Triglycerides: 180 mg/dL — ABNORMAL HIGH (ref <150)
VLDL: 36 mg/dL (ref 0–40)

## 2020-05-12 LAB — HEMOGLOBIN A1C
Hgb A1c MFr Bld: 5.9 % — ABNORMAL HIGH (ref 4.8–5.6)
Mean Plasma Glucose: 122.63 mg/dL

## 2020-05-12 MED ORDER — GENTAMICIN SULFATE 0.1 % EX CREA
1.0000 "application " | TOPICAL_CREAM | Freq: Every day | CUTANEOUS | Status: DC
  Administered 2020-05-12 – 2020-05-13 (×2): 1 via TOPICAL
  Filled 2020-05-12 (×2): qty 15

## 2020-05-12 MED ORDER — ROSUVASTATIN CALCIUM 20 MG PO TABS
20.0000 mg | ORAL_TABLET | Freq: Every evening | ORAL | Status: DC
  Administered 2020-05-12 – 2020-05-13 (×2): 20 mg via ORAL
  Filled 2020-05-12: qty 2
  Filled 2020-05-12 (×2): qty 1

## 2020-05-12 MED ORDER — EZETIMIBE 10 MG PO TABS
10.0000 mg | ORAL_TABLET | Freq: Every day | ORAL | Status: DC
  Administered 2020-05-13 – 2020-05-14 (×2): 10 mg via ORAL
  Filled 2020-05-12 (×2): qty 1

## 2020-05-12 MED ORDER — DIPHENHYDRAMINE HCL 25 MG PO TABS
25.0000 mg | ORAL_TABLET | Freq: Four times a day (QID) | ORAL | Status: DC | PRN
  Filled 2020-05-12: qty 1

## 2020-05-12 MED ORDER — SODIUM BICARBONATE 650 MG PO TABS: 650.0000 mg | ORAL_TABLET | Freq: Every day | ORAL | Status: DC

## 2020-05-12 MED ORDER — CALCITRIOL 0.25 MCG PO CAPS
0.2500 ug | ORAL_CAPSULE | ORAL | Status: DC
  Administered 2020-05-14: 09:00:00 0.25 ug via ORAL
  Filled 2020-05-12: qty 1

## 2020-05-12 MED ORDER — DILTIAZEM HCL 30 MG PO TABS
60.0000 mg | ORAL_TABLET | Freq: Three times a day (TID) | ORAL | Status: DC
  Administered 2020-05-12: 60 mg via ORAL
  Filled 2020-05-12: qty 1

## 2020-05-12 MED ORDER — RENA-VITE PO TABS
1.0000 | ORAL_TABLET | Freq: Every day | ORAL | Status: DC
  Administered 2020-05-12 – 2020-05-13 (×2): 1 via ORAL
  Filled 2020-05-12 (×3): qty 1

## 2020-05-12 MED ORDER — SODIUM BICARBONATE 650 MG PO TABS
325.0000 mg | ORAL_TABLET | Freq: Two times a day (BID) | ORAL | Status: DC
  Administered 2020-05-12 – 2020-05-14 (×4): 325 mg via ORAL
  Filled 2020-05-12 (×5): qty 0.5

## 2020-05-12 MED ORDER — GABAPENTIN 100 MG PO CAPS
100.0000 mg | ORAL_CAPSULE | Freq: Three times a day (TID) | ORAL | Status: DC
  Administered 2020-05-12: 100 mg via ORAL
  Filled 2020-05-12: qty 1

## 2020-05-12 MED ORDER — IPRATROPIUM-ALBUTEROL 0.5-2.5 (3) MG/3ML IN SOLN
3.0000 mL | Freq: Four times a day (QID) | RESPIRATORY_TRACT | Status: DC
  Administered 2020-05-12 (×2): 3 mL via RESPIRATORY_TRACT
  Filled 2020-05-12 (×2): qty 3

## 2020-05-12 MED ORDER — GENTAMICIN SULFATE 0.1 % EX CREA: 1.0000 "application " | TOPICAL_CREAM | Freq: Every day | CUTANEOUS | Status: DC

## 2020-05-12 MED ORDER — CLOPIDOGREL BISULFATE 75 MG PO TABS
75.0000 mg | ORAL_TABLET | Freq: Every day | ORAL | Status: DC
  Administered 2020-05-12 – 2020-05-14 (×3): 75 mg via ORAL
  Filled 2020-05-12 (×3): qty 1

## 2020-05-12 MED ORDER — DELFLEX-LC/2.5% DEXTROSE 394 MOSM/L IP SOLN
INTRAPERITONEAL | Status: DC
  Filled 2020-05-12 (×3): qty 3000

## 2020-05-12 MED ORDER — FLUTICASONE PROPIONATE 50 MCG/ACT NA SUSP
2.0000 | Freq: Every day | NASAL | Status: DC
  Filled 2020-05-12 (×2): qty 16

## 2020-05-12 MED ORDER — ASPIRIN EC 81 MG PO TBEC
81.0000 mg | DELAYED_RELEASE_TABLET | Freq: Every day | ORAL | Status: DC
  Administered 2020-05-12 – 2020-05-14 (×3): 81 mg via ORAL
  Filled 2020-05-12 (×4): qty 1

## 2020-05-12 MED ORDER — CALCITRIOL 0.25 MCG PO CAPS: 0.2500 ug | ORAL_CAPSULE | Freq: Every day | ORAL | Status: DC

## 2020-05-12 MED ORDER — HEPARIN 1000 UNIT/ML FOR PERITONEAL DIALYSIS
500.0000 [IU] | INTRAMUSCULAR | Status: DC | PRN
  Filled 2020-05-12: qty 0.5

## 2020-05-12 MED ORDER — HEPARIN SODIUM (PORCINE) 5000 UNIT/ML IJ SOLN
5000.0000 [IU] | Freq: Three times a day (TID) | INTRAMUSCULAR | Status: DC
  Administered 2020-05-12 – 2020-05-13 (×4): 5000 [IU] via SUBCUTANEOUS
  Filled 2020-05-12 (×3): qty 1

## 2020-05-12 MED ORDER — ISOSORBIDE MONONITRATE ER 30 MG PO TB24
30.0000 mg | ORAL_TABLET | Freq: Every day | ORAL | Status: DC
  Administered 2020-05-12 – 2020-05-13 (×2): 30 mg via ORAL
  Filled 2020-05-12 (×2): qty 1

## 2020-05-12 NOTE — Progress Notes (Signed)
*  PRELIMINARY RESULTS* Echocardiogram 2D Echocardiogram has been performed.  [name] 05/12/2020, 10:49 AM

## 2020-05-12 NOTE — Progress Notes (Signed)
[facility], [place] 05/12/20  Subjective:   LOS: 0  Was not able to undergo Pd because no room available on the floor. Undergoing bronchodilator treatment when seen. Reports that his shortness of breath is overall improved Heart rate is not irregular anymore.  Objective:  Vital signs in last 24 hours:  Temp:  [97.7 F (36.5 C)] 97.7 F (36.5 C) (12/20 2003) Pulse Rate:  [50-152] 55 (12/20 2003) Resp:  [10-60] 18 (12/20 2003) BP: (105-217)/(65-117) 105/84 (12/20 2003) SpO2:  [91 %-100 %] 98 % (12/20 2003) Weight:  [66.5 kg] 66.5 kg (12/20 1801)  Weight change:  Filed Weights   05/11/20 1039 05/12/20 1801  Weight: 64.4 kg 66.5 kg    Intake/Output:   No intake or output data in the 24 hours ending 05/12/20 2056  Physical Exam: General:  No acute distress, laying in the bed  HEENT  anicteric, moist oral mucous membrane  Pulm/lungs  normal breathing effort, lungs are clear to auscultation  CVS/Heart  regular rhythm, no rub or gallop  Abdomen:   Soft, nontender  Extremities:  No peripheral edema  Neurologic:  Alert, oriented, able to follow commands  Skin:  No acute rashes  PD catheter in place    Basic Metabolic Panel:  Recent Labs  Lab 05/11/20 1042 05/12/20 1238 05/12/20 1807  NA 141 141 140  K 4.4 3.8 4.1  CL 105 109 108  CO2 22 20* 21*  GLUCOSE 139* 145* 154*  BUN 59* 60* 58*  CREATININE 7.19* 7.50* 7.41*  CALCIUM 8.6* 8.7* 8.5*  MG 2.2 2.2  --      CBC: Recent Labs  Lab 05/11/20 1042 05/12/20 1238 05/12/20 1807  WBC 10.2 11.0* 8.9  HGB 9.2* 9.0* 8.7*  HCT 26.3* 25.7* 25.4*  MCV 90.7 89.9 90.4  PLT 166 147* 143*     No results found for: HEPBSAG, HEPBSAB, HEPBIGM    Microbiology:  Recent Results (from the past 240 hour(s))  Resp Panel by RT-PCR (Flu A&B, Covid) Nasopharyngeal Swab     Status: None   Collection Time: 05/11/20 11:39 AM   Specimen: Nasopharyngeal Swab; Nasopharyngeal(NP) swabs in vial  transport medium  Result Value Ref Range Status   SARS Coronavirus 2 by RT PCR NEGATIVE NEGATIVE Final    Comment: (NOTE) SARS-CoV-2 target nucleic acids are NOT DETECTED.  The SARS-CoV-2 RNA is generally detectable in upper respiratory specimens during the acute phase of infection. The lowest concentration of SARS-CoV-2 viral copies this assay can detect is 138 copies/mL. A negative result does not preclude SARS-Cov-2 infection and should not be used as the sole basis for treatment or other patient management decisions. A negative result may occur with  improper specimen collection/handling, submission of specimen other than nasopharyngeal swab, presence of viral mutation(s) within the areas targeted by this assay, and inadequate number of viral copies(<138 copies/mL). A negative result must be combined with clinical observations, patient history, and epidemiological information. The expected result is Negative.  Fact Sheet for Patients:  EntrepreneurPulse.com.au  Fact Sheet for Healthcare Providers:  IncredibleEmployment.be  This test is no t yet approved or cleared by the [place] FDA and  has been authorized for detection and/or diagnosis of SARS-CoV-2 by FDA under an Emergency Use Authorization (EUA). This EUA will remain  in effect (meaning this test can be used) for the duration of the COVID-19 declaration under Section 564(b)(1) of the Act, 21 U.S.C.section 360bbb-3(b)(1), unless the authorization is terminated  or revoked sooner.  Influenza A by PCR NEGATIVE NEGATIVE Final   Influenza B by PCR NEGATIVE NEGATIVE Final    Comment: (NOTE) The Xpert Xpress SARS-CoV-2/FLU/RSV plus assay is intended as an aid in the diagnosis of influenza from Nasopharyngeal swab specimens and should not be used as a sole basis for treatment. Nasal washings and aspirates are unacceptable for Xpert Xpress SARS-CoV-2/FLU/RSV testing.  Fact  Sheet for Patients: EntrepreneurPulse.com.au  Fact Sheet for Healthcare Providers: IncredibleEmployment.be  This test is not yet approved or cleared by the [place] FDA and has been authorized for detection and/or diagnosis of SARS-CoV-2 by FDA under an Emergency Use Authorization (EUA). This EUA will remain in effect (meaning this test can be used) for the duration of the COVID-19 declaration under Section 564(b)(1) of the Act, 21 U.S.C. section 360bbb-3(b)(1), unless the authorization is terminated or revoked.  Performed at [facility], [address] [place], [place] [address]     Coagulation Studies: No results for input(s): LABPROT, INR in the last 72 hours.  Urinalysis: No results for input(s): COLORURINE, LABSPEC, PHURINE, GLUCOSEU, HGBUR, BILIRUBINUR, KETONESUR, PROTEINUR, UROBILINOGEN, NITRITE, LEUKOCYTESUR in the last 72 hours.  Invalid input(s): APPERANCEUR    Imaging: DG Chest Portable 1 View  Result Date: 05/11/2020 CLINICAL DATA:  Shortness of breath. History of CHF. Evaluate for infiltrates. EXAM: PORTABLE CHEST 1 VIEW COMPARISON:  April 04, 2019 FINDINGS: No pneumothorax. Stable cardiomegaly. The hila and mediastinum are unremarkable. No overt edema. No suspicious infiltrate. Possible tiny left effusion with atelectasis. IMPRESSION: Possible tiny left effusion with mild atelectasis.  No overt edema. Electronically Signed   By: Dorise Bullion III M.D   On: 05/11/2020 12:18   ECHOCARDIOGRAM COMPLETE  Result Date: 05/12/2020    ECHOCARDIOGRAM REPORT   Patient Name:   [name] Date of Exam: 05/12/2020 Medical Rec #:  [id number]       Height:       66.0 in Accession #:    [id number]      Weight:       142.0 lb Date of Birth:  December 02, 1945       BSA:          1.729 m Patient Age:    74 years        BP:           150/65 mmHg Patient Gender: M               HR:           68 bpm. Exam Location:  [facility] Procedure: 2D  Echo, Color Doppler, Cardiac Doppler and Strain Analysis Indications:     Elevated troponin  History:         Patient has prior history of Echocardiogram examinations, most                  recent 01/14/2020. TIA and PAD; Risk Factors:Current Smoker,                  Hypertension, Dyslipidemia and Diabetes.  Sonographer:     Charmayne Sheer RDCS (AE) Referring Phys:  [id number] Arvil Chaco Diagnosing Phys: Kathlyn Sacramento MD  Sonographer Comments: Global longitudinal strain was attempted. IMPRESSIONS  1. Left ventricular ejection fraction, by estimation, is 40 to 45%. The left ventricle has mildly decreased function. The left ventricle has no regional wall motion abnormalities. The left ventricular internal cavity size was mildly dilated. There is moderate left ventricular hypertrophy. Left ventricular diastolic parameters are consistent with Grade  I diastolic dysfunction (impaired relaxation). The average left ventricular global longitudinal strain is -13.7 %. The global longitudinal strain is abnormal.  2. Right ventricular systolic function is normal. The right ventricular size is normal. Tricuspid regurgitation signal is inadequate for assessing PA pressure.  3. Left atrial size was moderately dilated.  4. The mitral valve is normal in structure. Mild mitral valve regurgitation. No evidence of mitral stenosis.  5. The aortic valve is normal in structure. Aortic valve regurgitation is mild. Mild to moderate aortic valve sclerosis/calcification is present, without any evidence of aortic stenosis.  6. Aortic dilatation noted. There is mild dilatation of the aortic root, measuring 39 mm.  7. The inferior vena cava is dilated in size with >50% respiratory variability, suggesting right atrial pressure of 8 mmHg. FINDINGS  Left Ventricle: Left ventricular ejection fraction, by estimation, is 40 to 45%. The left ventricle has mildly decreased function. The left ventricle has no regional wall motion abnormalities. The  average left ventricular global longitudinal strain is -13.7 %. The global longitudinal strain is abnormal. The left ventricular internal cavity size was mildly dilated. There is moderate left ventricular hypertrophy. Left ventricular diastolic parameters are consistent with Grade I diastolic dysfunction (impaired relaxation). Right Ventricle: The right ventricular size is normal. No increase in right ventricular wall thickness. Right ventricular systolic function is normal. Tricuspid regurgitation signal is inadequate for assessing PA pressure. Left Atrium: Left atrial size was moderately dilated. Right Atrium: Right atrial size was normal in size. Pericardium: There is no evidence of pericardial effusion. Mitral Valve: The mitral valve is normal in structure. Mild mitral valve regurgitation. No evidence of mitral valve stenosis. MV peak gradient, 6.5 mmHg. The mean mitral valve gradient is 2.0 mmHg. Tricuspid Valve: The tricuspid valve is normal in structure. Tricuspid valve regurgitation is not demonstrated. No evidence of tricuspid stenosis. Aortic Valve: The aortic valve is normal in structure. Aortic valve regurgitation is mild. Aortic regurgitation PHT measures 573 msec. Mild to moderate aortic valve sclerosis/calcification is present, without any evidence of aortic stenosis. Aortic valve  mean gradient measures 5.0 mmHg. Aortic valve peak gradient measures 9.6 mmHg. Aortic valve area, by VTI measures 4.25 cm. Pulmonic Valve: The pulmonic valve was normal in structure. Pulmonic valve regurgitation is trivial. No evidence of pulmonic stenosis. Aorta: Aortic dilatation noted. There is mild dilatation of the aortic root, measuring 39 mm. Venous: The inferior vena cava is dilated in size with greater than 50% respiratory variability, suggesting right atrial pressure of 8 mmHg. IAS/Shunts: No atrial level shunt detected by color flow Doppler.  LEFT VENTRICLE PLAX 2D LVIDd:         5.79 cm  Diastology LVIDs:          4.54 cm  LV e' medial:    3.59 cm/s LV PW:         1.38 cm  LV E/e' medial:  20.3 LV IVS:        0.87 cm  LV e' lateral:   7.07 cm/s LVOT diam:     2.70 cm  LV E/e' lateral: 10.3 LV SV:         129 LV SV Index:   75       2D Longitudinal Strain LVOT Area:     5.73 cm 2D Strain GLS Avg:     -13.7 %  RIGHT VENTRICLE RV Basal diam:  3.24 cm LEFT ATRIUM             Index  RIGHT ATRIUM           Index LA diam:        3.90 cm 2.26 cm/m  RA Area:     15.80 cm LA Vol (A2C):   56.9 ml 32.91 ml/m RA Volume:   37.50 ml  21.69 ml/m LA Vol (A4C):   80.9 ml 46.79 ml/m LA Biplane Vol: 69.2 ml 40.03 ml/m  AORTIC VALVE                    PULMONIC VALVE AV Area (Vmax):    3.77 cm     PV Vmax:       0.93 m/s AV Area (Vmean):   3.90 cm     PV Vmean:      62.500 cm/s AV Area (VTI):     4.25 cm     PV VTI:        0.211 m AV Vmax:           155.00 cm/s  PV Peak grad:  3.4 mmHg AV Vmean:          101.000 cm/s PV Mean grad:  2.0 mmHg AV VTI:            0.303 m AV Peak Grad:      9.6 mmHg AV Mean Grad:      5.0 mmHg LVOT Vmax:         102.00 cm/s LVOT Vmean:        68.800 cm/s LVOT VTI:          0.225 m LVOT/AV VTI ratio: 0.74 AI PHT:            573 msec  AORTA Ao Root diam: 3.90 cm MITRAL VALVE MV Area (PHT): 3.39 cm     SHUNTS MV Peak grad:  6.5 mmHg     Systemic VTI:  0.22 m MV Mean grad:  2.0 mmHg     Systemic Diam: 2.70 cm MV Vmax:       1.27 m/s MV Vmean:      66.5 cm/s MV Decel Time: 224 msec MV E velocity: 72.80 cm/s MV A velocity: 112.00 cm/s MV E/A ratio:  0.65 Kathlyn Sacramento MD Electronically signed by Kathlyn Sacramento MD Signature Date/Time: 05/12/2020/12:34:03 PM    Final      Medications:   . dialysis solution 2.5% low-MG/low-CA    . dialysis solution 2.5% low-MG/low-CA     . amLODipine  10 mg Oral Daily  . aspirin EC  81 mg Oral Daily  . [START ON 05/14/2020] calcitRIOL  0.25 mcg Oral Q M,W,F  . carvedilol  25 mg Oral BID WC  . cloNIDine  0.2 mg Oral TID  . clopidogrel  75 mg Oral Daily  .  doxazosin  8 mg Oral Daily  . ezetimibe  10 mg Oral Daily  . fluticasone  2 spray Each Nare Daily  . gentamicin cream  1 application Topical Daily  . heparin  5,000 Units Subcutaneous Q8H  . ipratropium-albuterol  3 mL Nebulization Q6H  . isosorbide mononitrate  30 mg Oral Daily  . multivitamin  1 tablet Oral QHS  . nicotine  21 mg Transdermal Daily  . rosuvastatin  20 mg Oral QPM  . sodium bicarbonate  325 mg Oral BID   acetaminophen, dextromethorphan-guaiFENesin, diphenhydrAMINE, heparin, hydrALAZINE, levalbuterol  Assessment/ Plan:  74 y.o. male with end-stage renal disease on peritoneal dialysis, hypertension, hyperlipidemia, diabetes, COPD, stroke, history of renal cell carcinoma, status post left nephrectomy  history of prostate cancer, CHF with EF 40 to 45% was admitted on 05/11/2020 for  Principal Problem:   Hypertensive urgency Active Problems:   Hyperlipidemia   tobacco abuse   Prostate cancer ([place])   DM (diabetes mellitus), type 2 with renal complications (HCC)   Accelerated hypertension   Stroke (HCC)   End stage renal disease (HCC)   Elevated troponin   Acute on chronic systolic congestive heart failure (HCC)   Fluid overload   Atrial fibrillation with rapid ventricular response (HCC)  Orthopnea [R06.01] Cough [R05.9] SOB (shortness of breath) [R06.02] Acute on chronic systolic congestive heart failure (HCC) [I50.23] Atrial fibrillation with rapid ventricular response ([place]) [I48.91] Hypertensive urgency [I16.0]  #. ESRD CCPD/67 kg/3 cycles 2000 cc/Dr kolluru We will resume PD when room available   #. Anemia of CKD  Lab Results  Component Value Date   HGB 8.7 (L) 05/12/2020   Was getting EPO as outpatient Will consider SQ   #. Secondary hyperparathyroidism of renal origin N 25.81      Component Value Date/Time   PTH 315 (H) 05/11/2017 1450   No results found for: PHOS Monitor calcium and phos level during this admission   #.  Shortness of  breath in setting of chronic systolic CHF.  Uncontrolled hypertension.  EF 40 to 45%.  Cardiology evaluation in progress.   LOS: 0 Baillie Mohammad 12/20/20218:56 PM  [facility] [place], [place]

## 2020-05-12 NOTE — Consult Note (Signed)
Cardiology Consultation:   Patient ID: [name] MRN: [id number]; DOB: 02-Sep-1945  Admit date: 05/11/2020 Date of Consult: 05/12/2020  Primary Care Provider: Crecencio Mc, MD [facility] Cardiologist: Brett Bush, MD  [facility] Electrophysiologist:  None    Patient Profile:   [name] is a 74 y.o. male with a hx of cardiomyopathy with moderately reduced LVEF, stroke, PAD, hypertension, hyperlipidemia, OSA, renal cell carcinoma s/p left nephrectomy, adenocarcinoma of the appendix, current tobacco use, and ESRD on PD, and who is being seen today for the evaluation of HTN at the request of [name].  History of Present Illness:   [name] is a 74 yo male with h/o cardiomyopathy with moderately reduced LVEF, hypertension, hyperlipidemia, OSA, peripheral vascular disease, renal cell carcinoma s/p left nephrectomy, adenocarcinoma of the appendix, current tobacco use, and ESRD with recent initiation of peritoneal dialysis and followed by [name].    He has history of chronic dyspnea and poorly controlled BP.    Stress test 01/2019 showed small, fixed basal, and mid anterolateral defect, most likely representing scar and less likely artifact.  A small, mild, minimally reversible mid and apical inferior defect representing scar and possibly peri-infarct ischemia was also noted.  Coronary artery and aortic atherosclerosis were present.  At that time, he had no concerning / unstable symptoms.   When seen 04/2019, he reported BP up to 222/100 with atypical CP. After starting minoxidil, his BP had been lower with SBP 140-165mmHg and transient orthostatic hypotension.  It was noted BP seemed to improve with alteration of dialysis solution.   Seen in the hospital 12/2019 for stroke, suspected as secondary to atheroembolic event from extensive aortic and carotid atherosclerotic disease.    12/2019 Echo did not show obvious etiology of stroke.  He did not have any atrial  arrhythmias identified.    Recommendation was for DAPT and ambulatory cardiac monitoring.  Risk factor modification, including LDL less than 70 was recommended.  Consultation with vascular surgery also recommended given short segment dissection of the proximal left subclavian artery.  Improved BP control recommended.  Over the last 2 weeks, he reports progressive SOB that limited his every day activity. In addition, he noted orthopnea and PND that eventually resulted in his sleeping in a recliner. He reported cough with clear sputum. He reports chest tightness, rather than CP, and associated with SOB. He has ongoing fatigue. No presyncope, syncope. No tachypalpitations, though he reports he was told he was in Afib by EMS. He reports his BP has not been well controlled. He was uncertain of the amount of fluid he was drinking but did note adding salt to his food.   As above, he was informed that he was in Afib by EMS. Telemetry shows NSR, ST, SVT, and PAC/PVCs with sinus pauses up to 2.57 seconds but no clear evidence of Atrial fibrillation. It is documented the EMS gave him IV metoprolol with subsequent HR 50-70s.  In the ED, initial vitals significant for HTN with BP 195/141.  HS Tn 21  177 and still cycling. BNP elevated at 3,057.7. TSH wnl. Cr 7.19 and BUN 59 on PD. K 4.4 and Mg 2.2. Hgb 9.2 with HCT 26.3. CXR with tiny L effusion and mild atelectasis.   Past Medical History:  Diagnosis Date  . Adenocarcinoma of appendix [name]) Jan 2006   right kidney, s/p cryoablation  . Cardiomyopathy ([name])    a. 12/2018 Echo: EF 40-45%, global HK. Asc Ao 3.7cm; b. 01/2019  Lexi MV: small, mild, fixed basal and mid antlat defect - scar vs artifact. Small, mild mid and apical inf minimally reversible defect, likely scar w/ peri-infarct ischemia. Coronary and Ao atherosclerosis; c. 12/2019 Echo: EF 40-45%, glob HK, Gr1 DD. Nl RV fxn. Sev dil LA. Mild MR. Mild-mod Ao sclerosis w/o stenosis. Asc Ao 70mm.  . Carotid  arterial disease ([name])    a. 01/2020 RICA 1-39%, RCCA/RECA < 93%, LICA 7-16%, LCCA/LECA <50%.  . Claudication ([name])    a. 02/2020 ABI/TBI: R 1.08/0.95, L 1.00/0.70.  Marland Kitchen Complication of anesthesia    had to be woken up slowly as his bp was elevated when did this quickly  . Diabetes mellitus without complication ([place])   . ESRD (end stage renal disease) ([name])    a. Peritoneal Dialysis pt.  . Hyperlipidemia   . Hypertension   . Migraine    cluster  . Neuromuscular disorder ([name])    left lower extrem neuropathy  . Obstructive sleep apnea    no OSA since had facial surgery with dr. Kathyrn Torres in 1997  . PAD (peripheral artery disease) [name]) Feb 2009   nonobstructing, renal angiogram ([name])  . Renal cell carcinoma 2004   left kidney heminephrectomy  . Renal insufficiency   . Stroke [name])    a. 12/2019 MRI: Small acute infarcts involving the left cerebral hemisphere and several chronic infarcts.  Marland Kitchen TIA (transient ischemic attack)    no residual but left leg and foot still feel heavy  . tobacco abuse     Past Surgical History:  Procedure Laterality Date  . CAPD INSERTION N/A 03/01/2019   Procedure: LAPAROSCOPIC INSERTION CONTINUOUS AMBULATORY PERITONEAL DIALYSIS  (CAPD) CATHETER;  Surgeon: Brett Huxley, MD;  Location: [name];  Service: Vascular;  Laterality: N/A;  . CARDIAC CATHETERIZATION     Dr. Fletcher Torres did this to assess his renal artery  . cyst removal  12/25/2015   Spine L4 and L5  . heminephrectomy  2004   for renal cell CA  . RENAL CRYOABLATION  Jan 2006   right kidney,  [name]  . sciatica       Home Medications:  Prior to Admission medications   Medication Sig Start Date End Date Taking? Authorizing Provider  amLODipine (NORVASC) 10 MG tablet TAKE 1 TABLET BY MOUTH ONCE DAILY 06/04/19  Yes Brett Mc, MD  carvedilol (COREG) 25 MG tablet Take 25 mg by mouth 2 (two) times daily with a meal. 07/07/17  Yes [provider]  cloNIDine (CATAPRES) 0.2 MG tablet Take 0.2  mg by mouth 3 (three) times daily. 03/06/20  Yes [provider]  clopidogrel (PLAVIX) 75 MG tablet Take 1 tablet (75 mg total) by mouth daily. 04/02/20  Yes Theora Gianotti, NP  doxazosin (CARDURA) 8 MG tablet Take 8 mg by mouth daily. 03/29/20  Yes [provider]  isosorbide mononitrate (IMDUR) 30 MG 24 hr tablet Take 1 tablet (30 mg total) by mouth daily. 04/02/20 07/01/20 Yes Theora Gianotti, NP  rosuvastatin (CRESTOR) 20 MG tablet Take 1 tablet (20 mg total) by mouth at bedtime. 04/02/20 04/02/21 Yes Theora Gianotti, NP  albuterol (VENTOLIN HFA) 108 (90 Base) MCG/ACT inhaler Inhale 2 puffs into the lungs every 6 (six) hours as needed for wheezing or shortness of breath. 04/12/19   Brett Mc, MD  aspirin 81 MG tablet Take 1 tablet (81 mg total) by mouth daily. 05/16/14   Brett Mc, MD  calcitRIOL (ROCALTROL) 0.25 MCG capsule  Take 0.25 mcg by mouth daily. Take one capsule only on Monday / Wednesday / FRIDAY 03/04/18   [provider]  diphenhydrAMINE (BENADRYL) 25 MG tablet Take 25 mg by mouth every 6 (six) hours as needed.     [provider]  ezetimibe (ZETIA) 10 MG tablet Take 1 tablet (10 mg total) by mouth daily. 05/09/19 04/02/29  Theora Gianotti, NP  fluticasone (FLONASE) 50 MCG/ACT nasal spray Place 2 sprays into both nostrils daily. 05/29/18   Brett Mc, MD  gabapentin (NEURONTIN) 100 MG capsule TAKE 1 CAPSULE BY MOUTH 3 TIMES DAILY 10/15/19   Brett Mc, MD  gentamicin cream (GARAMYCIN) 0.1 % Apply 1 application topically daily. 01/15/20   Loletha Grayer, MD  multivitamin (RENA-VIT) TABS tablet Take 1 tablet by mouth at bedtime. 12/21/19   [provider]  nicotine (NICODERM CQ - DOSED IN MG/24 HOURS) 14 mg/24hr patch 14 mg patch daily to chest wall, okay to substitute generic 01/15/20   Loletha Grayer, MD  sodium bicarbonate 325 MG tablet TK 1 T PO BID 02/14/18   [provider]    Inpatient Medications: Scheduled Meds: . amLODipine  10 mg Oral Daily  . aspirin EC  81 mg Oral Daily  . calcitRIOL  0.25 mcg Oral Daily  . carvedilol  25 mg Oral BID WC  . cloNIDine  0.2 mg Oral TID  . clopidogrel  75 mg Oral Daily  . diltiazem  60 mg Oral Q8H  . doxazosin  8 mg Oral Daily  . ezetimibe  10 mg Oral Daily  . fluticasone  2 spray Each Nare Daily  . gabapentin  100 mg Oral TID  . gentamicin cream  1 application Topical Daily  . heparin  5,000 Units Subcutaneous Q8H  . ipratropium-albuterol  3 mL Nebulization Q6H  . ipratropium-albuterol  3 mL Nebulization Q6H  . isosorbide mononitrate  30 mg Oral Daily  . multivitamin  1 tablet Oral QHS  . nicotine  21 mg Transdermal Daily  . rosuvastatin  20 mg Oral QPM  . sodium bicarbonate  650 mg Oral Daily   Continuous Infusions:  PRN Meds: acetaminophen, dextromethorphan-guaiFENesin, diphenhydrAMINE, hydrALAZINE, levalbuterol  Allergies:    Allergies  Allergen Reactions  . Irbesartan Other (See Comments)    hyperkalemia  . Cymbalta [Duloxetine Hcl]   . Atorvastatin Other (See Comments)    Muscle pain  . Bystolic [Nebivolol Hcl]     Extreme fatigue     Social History:   Social History   Socioeconomic History  . Marital status: Married    Spouse name: Bailey Mech  . Number of children: Not on file  . Years of education: Not on file  . Highest education level: Not on file  Occupational History  . 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 Smoking status: Current Every Day Smoker    Packs/day: 1.00    Years: 50.00    Pack years: 50.00    Types: Cigarettes  . Smokeless tobacco: Never Used  Vaping Use  . Vaping Use: Former  . Devices: tried but did not like  Substance and Sexual Activity  . Alcohol use: Not Currently  . Drug use: No  . Sexual activity: Yes  Other Topics Concern  . Not on file  Social History Narrative  . Not on file   Social Determinants of Health   Financial  Resource Strain: Not on file  Food Insecurity: No Food Insecurity  . Worried  About Running Out of Food in the Last Year: Never true  . Ran Out of Food in the Last Year: Never true  Transportation Needs: No Transportation Needs  . Lack of Transportation (Medical): No  . Lack of Transportation (Non-Medical): No  Physical Activity: Not on file  Stress: No Stress Concern Present  . Feeling of Stress : Only a little  Social Connections: Not on file  Intimate Partner Violence: Not on file    Family History:     Family History  Problem Relation Age of Onset  . Hypertension Mother   . Cancer Mother        breast  . Aneurysm Mother   . Coronary artery disease Father   . Hypertension Father   . Stroke Father 11  . Heart disease Father   . Heart attack Father 34  . Aneurysm Maternal Grandmother        brain  . Aneurysm Paternal Grandmother        brain  . Coronary artery disease Paternal Grandfather   . Heart disease Brother        valvular heart disease  . COPD Brother   . Hypertension Brother   . Stroke Paternal Uncle      ROS:  Please see the history of present illness.  Review of Systems  Respiratory: Positive for cough, sputum production, shortness of breath and wheezing.   Cardiovascular: Positive for orthopnea and PND. Negative for chest pain, palpitations and leg swelling.  All other systems reviewed and are negative.   All other ROS reviewed and negative.     Physical Exam/Data:   Vitals:   05/12/20 0700 05/12/20 0830 05/12/20 0900 05/12/20 0915  BP: (!) 189/74 (!) 217/89 (!) 202/85 (!) 189/79  Pulse: 77 79 86 77  Resp: (!) 25 (!) 21 (!) 60 19  Temp:      TempSrc:      SpO2: 96% 98% 97% 96%  Weight:      Height:        Intake/Output Summary (Last 24 hours) at 05/12/2020 1047 Last data filed at 05/11/2020 1734 Gross per 24 hour  Intake --  Output 800 ml  Net -800 ml   Last 3 Weights 05/11/2020 04/02/2020 03/10/2020  Weight (lbs) 142 lb 148 lb 142 lb   Weight (kg) 64.411 kg 67.132 kg 64.411 kg     Body mass index is 22.92 kg/m.  General:  Well nourished, well developed, in no acute distress and eating breakfast HEENT: normal Lymph: no adenopathy Neck: no JVD Endocrine:  No thryomegaly Vascular: No carotid bruits; FA pulses 2+ bilaterally without bruits  Cardiac:  normal S1, S2; RRR; 1/6 systolic murmur Lungs:  Bibasilar crackles, coarse breath sounds bilaterally Abd: soft, nontender, no hepatomegaly  Ext: no edema Musculoskeletal:  No deformities, BUE and BLE strength normal and equal Skin: warm and dry  Neuro:  CNs 2-12 intact, no focal abnormalities noted Psych:  Normal affect   EKG:  The EKG was personally reviewed and demonstrates:   Telemetry:  Telemetry was personally reviewed and demonstrates:  NSR, ST, SVT, PACs, PVCs, pauses up to 2.57 seconds  Relevant CV Studies: Echo 01/14/20 1. Left ventricular ejection fraction, by estimation, is 40 to 45%. The  left ventricle has mildly decreased function. The left ventricle  demonstrates global hypokinesis. There is moderate left ventricular  hypertrophy. Left ventricular diastolic  parameters are consistent with Grade I diastolic dysfunction (impaired  relaxation). Elevated left atrial pressure.  2. Right ventricular  systolic function is normal. The right ventricular  size is normal. Mildly increased right ventricular wall thickness.  3. Left atrial size was severely dilated.  4. The mitral valve is degenerative. Mild mitral valve regurgitation. No  evidence of mitral stenosis.  5. Unable to determine valve morphology due to image quality. Aortic  valve regurgitation is mild. Mild to moderate aortic valve  sclerosis/calcification is present, without any evidence of aortic  stenosis.  6. Aortic dilatation noted. There is mild dilatation of the ascending  aorta measuring 37 mm.  7. The inferior vena cava is normal in size with greater than 50%  respiratory  variability, suggesting right atrial pressure of 3 mmHg.  8. Agitated saline contrast bubble study was negative, with no evidence  of any interatrial shunt.   Laboratory Data:  High Sensitivity Troponin:   Recent Labs  Lab 05/11/20 1042 05/11/20 1140 05/11/20 1352 05/11/20 2237  TROPONINIHS 21* 153* 105* 177*     Chemistry Recent Labs  Lab 05/11/20 1042  NA 141  K 4.4  CL 105  CO2 22  GLUCOSE 139*  BUN 59*  CREATININE 7.19*  CALCIUM 8.6*  GFRNONAA 7*  ANIONGAP 14    No results for input(s): PROT, ALBUMIN, AST, ALT, ALKPHOS, BILITOT in the last 168 hours. Hematology Recent Labs  Lab 05/11/20 1042  WBC 10.2  RBC 2.90*  HGB 9.2*  HCT 26.3*  MCV 90.7  MCH 31.7  MCHC 35.0  RDW 13.7  PLT 166   BNP Recent Labs  Lab 05/11/20 1042  BNP 3,057.7*    DDimer No results for input(s): DDIMER in the last 168 hours.   Radiology/Studies:  DG Chest Portable 1 View  Result Date: 05/11/2020 CLINICAL DATA:  Shortness of breath. History of CHF. Evaluate for infiltrates. EXAM: PORTABLE CHEST 1 VIEW COMPARISON:  April 04, 2019 FINDINGS: No pneumothorax. Stable cardiomegaly. The hila and mediastinum are unremarkable. No overt edema. No suspicious infiltrate. Possible tiny left effusion with atelectasis. IMPRESSION: Possible tiny left effusion with mild atelectasis.  No overt edema. Electronically Signed   By: Dorise Bullion III M.D   On: 05/11/2020 12:18     Assessment and Plan:   Poorly controlled Hypertension --Poorly controled, likely exacerbated in the setting of volume overload. Continue to titrate antihypertensives as HR allows. Currently on amlodipine 10mg , coreg 25mg , clonidine 0.2mg , cardizem 60mg , cardura 8mg , Imdur 30mg . Recommend BP control 130/80 or lower and HR control. Reviewed keeping salt under 2g daily.  AOC HFrEF --Reports s/sx of worsening volume status over the last 2 weeks, including orthopnea, PND, SOB, and DOE. Volume up on exam.  He reports  adding salt to his food and poorly controlled BP at home and recent tachycardia, likely contributing to his current presentation. EF 40-45% 12/2019. Updated echo pending. MPI as below with recommendation for medical management in the setting of ESRD on PD. Continue lasix and recommendations per nephrology. Monitor I/O, daily weights. Further recommendations pending echo.  ReDS vest. CHF education recommended.  Sinus pauses --Telemetry significant for NSR, ST, SVT, PACs, and PVCs. Sinus pauses 2.57 seconds but not exceeding 3 seconds. He denies any associated presyncope or syncope. Recommend cardiac monitoring at discharge and close monitoring of telemetry. No clear evidence of Afib and thus will not start [place] at this time.   Elevated HS Tn --He reports chest tightness described as SOB/DOE. MPI 01/2019 with small, mild, fixed basal and mid anterolateral defect-scar versus artifact with peri-infarc ischemia. HS Tn still cycling. Continue to cycle  HS Tn. EKG without acute ST/T changes. Consider supply demand ischemia in the setting of rapid HR, poorly controlled BP, ESRD, and volume overload. He does have CAD with risk factors for ischemia etiology to include smoker and poorly controlled BP and HTN. Continue IV heparin. Pt preference has been for conservative management and thus medically managed in the setting of ESRD on PD. Once volume status optimized, reassess candidacy for further ischemic workup. Continue DAPT, BB, ARB, Imdur, and Crestor.  HLD, goal LDL <70, poorly controlled --Continue Crestor and Zetia. Last LDL 111 and not at goal. Consider PCSK9i.  Tobacco use --Smoking cessation recommended.  LE claudiction, PAD --Continue DAPT and statin, as well as to follow with vascular surgery  H/o CVA --Continue DAPT and statin.       TIMI Risk Score for Unstable Angina or Non-ST Elevation MI:   The patient's TIMI risk score is 5, which indicates a 26% risk of all cause mortality, new or recurrent  myocardial infarction or need for urgent revascularization in the next 14 days.  New York Heart Association (NYHA) Functional Class NYHA Class III        For questions or updates, please contact [facility] Please consult [email or web address] for contact info under    Signed, Arvil Chaco, PA-C  05/12/2020 10:47 AM

## 2020-05-12 NOTE — ED Notes (Signed)
Pt provided with sandwich tray.  

## 2020-05-12 NOTE — Progress Notes (Signed)
PROGRESS NOTE    [name]  LFY:[id number] DOB: [birth year] DOA: 05/11/2020 PCP: Brett Mc, MD    Assessment & Plan:   Principal Problem:   Hypertensive urgency Active Problems:   Hyperlipidemia   tobacco abuse   Prostate cancer ([place])   DM (diabetes mellitus), type 2 with renal complications (HCC)   Accelerated hypertension   Stroke ([place])   End stage renal disease (HCC)   Elevated troponin   Acute on chronic systolic congestive heart failure (HCC)   Fluid overload   Atrial fibrillation with rapid ventricular response ([place])    [name] is a 74 y.o. male with medical history significant of ESRD on peritoneal dialysis, HTN, HLD, DM, COPD, stroke, tobacco abuse, PAD, OSA, renal cell carcinoma, prostate cancer, CHF with EF of 40-45%, who presents with shortness of breath and palpitation.   Hypertensive urgency, fluid overload and acute on chronic systolic congestive heart failure:  Patient has 1+ leg edema, crackles on auscultation, consistent with fluid overload and CHF exacerbation.  2D echo on 01/17/2019 showed EF of 40-45%.  BNP 3057.  Dr. Theador Torres of nephrology is consulted for dialysis. --Blood pressure 217/86.  Although wife reported at baseline pt's BP is often that high --cont home amlodipine, coreg, clonidine, Imdur --PD for volume removal  Hyperlipidemia -zetia and Crestor  Tobacco abuse -Nicotine patch  Hx of Prostate cancer ([place]) -on Cardura  Diet controled DM (diabetes mellitus), type 2 with renal complications [address]): Recent A1c 6.2, well controlled.  Patient is not taking medications.  Blood sugar 139 on admission. --no need for BG checks  Stroke (HCC) -Aspirin, Plavix, Crestor, Zetia  End stage renal disease on peritoneal dialysis --Pt has not been following his PD regimen as prescribed --Dr. Theador Torres of nephrology is consulted --PD per nephrology  Elevated troponin:  trop 21 -->105-->247.  Patient has a chest pressure, but no  chest pain.  EKG showed ST depression in lateral leads.  Possibly due to demand ischemia. Dr. Garen Torres of card is consulted. Plan: --trend trop until peak -Aspirin, Crestor, Zetia, Coreg, Imdur  Possible A fib with RVR:   Per EMS, patient had new A. fib with RVR, with heart rate 150-170s. Patient was given 5 mg of metoprolol, converted to sinus rhythm at arrival to ED.  Overnight, pt received IV dilt 10 mg x1. Plan: --cont coreg  Anemia of chronic disease --Hgb 8-9 --cont Epo per nephrology   DVT prophylaxis: Heparin SQ Code Status: Full code  Family Communication: wife updated at bedside today Status is: ob to inpatient Dispo:   The patient is from: home Anticipated d/c is to: home Anticipated d/c date is: 1-2 days Patient currently is not medically stable to d/c due to: volume overloaded, being evaluated by nephrology and cardiology   Subjective and Interval History:  Pt reported feeling a lot more comfortable since presentation.  Had not had PD, but heart rate now controlled and in sinus rhythm.  No chest pain, no palpitation.  No N/V/D.   Objective: Vitals:   05/12/20 1445 05/12/20 1500 05/12/20 1530 05/12/20 1801  BP: (!) 177/75 (!) 169/71 (!) 160/66 (!) 151/65  Pulse: 70 71 67 (!) 56  Resp: 14 16 10 18   Temp:    97.7 F (36.5 C)  TempSrc:    Oral  SpO2: 96% 93% 93% 96%  Weight:    66.5 kg  Height:    5\' 6"  (1.676 m)   No intake or output data in the 24 hours  ending 05/12/20 1955 Filed Weights   05/11/20 1039 05/12/20 1801  Weight: 64.4 kg 66.5 kg    Examination:   Constitutional: NAD, AAOx3 HEENT: conjunctivae and lids normal, EOMI CV: RRR.  Normal rate.  No cyanosis.   RESP: normal respiratory effort, on RA Extremities: No effusions, edema in BLE SKIN: warm, dry and intact Neuro: II - XII grossly intact.   Psych: Normal mood and affect.  Appropriate judgement and reason   Data Reviewed: I have personally reviewed following labs and imaging  studies  CBC: Recent Labs  Lab 05/11/20 1042 05/12/20 1238 05/12/20 1807  WBC 10.2 11.0* 8.9  HGB 9.2* 9.0* 8.7*  HCT 26.3* 25.7* 25.4*  MCV 90.7 89.9 90.4  PLT 166 147* 371*   Basic Metabolic Panel: Recent Labs  Lab 05/11/20 1042 05/12/20 1238 05/12/20 1807  NA 141 141 140  K 4.4 3.8 4.1  CL 105 109 108  CO2 22 20* 21*  GLUCOSE 139* 145* 154*  BUN 59* 60* 58*  CREATININE 7.19* 7.50* 7.41*  CALCIUM 8.6* 8.7* 8.5*  MG 2.2 2.2  --    GFR: Estimated Creatinine Clearance: 7.9 mL/min (A) (by C-G formula based on SCr of 7.41 mg/dL (H)). Liver Function Tests: No results for input(s): AST, ALT, ALKPHOS, BILITOT, PROT, ALBUMIN in the last 168 hours. No results for input(s): LIPASE, AMYLASE in the last 168 hours. No results for input(s): AMMONIA in the last 168 hours. Coagulation Profile: No results for input(s): INR, PROTIME in the last 168 hours. Cardiac Enzymes: No results for input(s): CKTOTAL, CKMB, CKMBINDEX, TROPONINI in the last 168 hours. BNP (last 3 results) No results for input(s): PROBNP in the last 8760 hours. HbA1C: Recent Labs    05/12/20 1238  HGBA1C 5.9*   CBG: No results for input(s): GLUCAP in the last 168 hours. Lipid Profile: Recent Labs    05/12/20 1238  CHOL 122  HDL 25*  LDLCALC 61  TRIG 180*  CHOLHDL 4.9   Thyroid Function Tests: Recent Labs    05/11/20 1352  TSH 1.555   Anemia Panel: No results for input(s): VITAMINB12, FOLATE, FERRITIN, TIBC, IRON, RETICCTPCT in the last 72 hours. Sepsis Labs: No results for input(s): PROCALCITON, LATICACIDVEN in the last 168 hours.  Recent Results (from the past 240 hour(s))  Resp Panel by RT-PCR (Flu A&B, Covid) Nasopharyngeal Swab     Status: None   Collection Time: 05/11/20 11:39 AM   Specimen: Nasopharyngeal Swab; Nasopharyngeal(NP) swabs in vial transport medium  Result Value Ref Range Status   SARS Coronavirus 2 by RT PCR NEGATIVE NEGATIVE Final    Comment: (NOTE) SARS-CoV-2 target  nucleic acids are NOT DETECTED.  The SARS-CoV-2 RNA is generally detectable in upper respiratory specimens during the acute phase of infection. The lowest concentration of SARS-CoV-2 viral copies this assay can detect is 138 copies/mL. A negative result does not preclude SARS-Cov-2 infection and should not be used as the sole basis for treatment or other patient management decisions. A negative result may occur with  improper specimen collection/handling, submission of specimen other than nasopharyngeal swab, presence of viral mutation(s) within the areas targeted by this assay, and inadequate number of viral copies(<138 copies/mL). A negative result must be combined with clinical observations, patient history, and epidemiological information. The expected result is Negative.  Fact Sheet for Patients:  EntrepreneurPulse.com.au  Fact Sheet for Healthcare Providers:  IncredibleEmployment.be  This test is no t yet approved or cleared by the [place] FDA and  has been  authorized for detection and/or diagnosis of SARS-CoV-2 by FDA under an Emergency Use Authorization (EUA). This EUA will remain  in effect (meaning this test can be used) for the duration of the COVID-19 declaration under Section 564(b)(1) of the Act, 21 U.S.C.section 360bbb-3(b)(1), unless the authorization is terminated  or revoked sooner.       Influenza A by PCR NEGATIVE NEGATIVE Final   Influenza B by PCR NEGATIVE NEGATIVE Final    Comment: (NOTE) The Xpert Xpress SARS-CoV-2/FLU/RSV plus assay is intended as an aid in the diagnosis of influenza from Nasopharyngeal swab specimens and should not be used as a sole basis for treatment. Nasal washings and aspirates are unacceptable for Xpert Xpress SARS-CoV-2/FLU/RSV testing.  Fact Sheet for Patients: EntrepreneurPulse.com.au  Fact Sheet for Healthcare  Providers: IncredibleEmployment.be  This test is not yet approved or cleared by the [place] FDA and has been authorized for detection and/or diagnosis of SARS-CoV-2 by FDA under an Emergency Use Authorization (EUA). This EUA will remain in effect (meaning this test can be used) for the duration of the COVID-19 declaration under Section 564(b)(1) of the Act, 21 U.S.C. section 360bbb-3(b)(1), unless the authorization is terminated or revoked.  Performed at [facility], [address] [place], [place] [address]       Radiology Studies: DG Chest Portable 1 View  Result Date: 05/11/2020 CLINICAL DATA:  Shortness of breath. History of CHF. Evaluate for infiltrates. EXAM: PORTABLE CHEST 1 VIEW COMPARISON:  April 04, 2019 FINDINGS: No pneumothorax. Stable cardiomegaly. The hila and mediastinum are unremarkable. No overt edema. No suspicious infiltrate. Possible tiny left effusion with atelectasis. IMPRESSION: Possible tiny left effusion with mild atelectasis.  No overt edema. Electronically Signed   By: Dorise Bullion III M.D   On: 05/11/2020 12:18   ECHOCARDIOGRAM COMPLETE  Result Date: 05/12/2020    ECHOCARDIOGRAM REPORT   Patient Name:   [name] Date of Exam: 05/12/2020 Medical Rec #:  [id number]       Height:       66.0 in Accession #:    [id number]      Weight:       142.0 lb Date of Birth:  07-26-1945       BSA:          1.729 m Patient Age:    15 years        BP:           150/65 mmHg Patient Gender: M               HR:           68 bpm. Exam Location:  [facility] Procedure: 2D Echo, Color Doppler, Cardiac Doppler and Strain Analysis Indications:     Elevated troponin  History:         Patient has prior history of Echocardiogram examinations, most                  recent 01/14/2020. TIA and PAD; Risk Factors:Current Smoker,                  Hypertension, Dyslipidemia and Diabetes.  Sonographer:     Charmayne Sheer RDCS (AE) Referring Phys:  [id number]  Arvil Chaco Diagnosing Phys: Kathlyn Sacramento MD  Sonographer Comments: Global longitudinal strain was attempted. IMPRESSIONS  1. Left ventricular ejection fraction, by estimation, is 40 to 45%. The left ventricle has mildly decreased function. The left ventricle has no regional wall motion abnormalities. The left ventricular  internal cavity size was mildly dilated. There is moderate left ventricular hypertrophy. Left ventricular diastolic parameters are consistent with Grade I diastolic dysfunction (impaired relaxation). The average left ventricular global longitudinal strain is -13.7 %. The global longitudinal strain is abnormal.  2. Right ventricular systolic function is normal. The right ventricular size is normal. Tricuspid regurgitation signal is inadequate for assessing PA pressure.  3. Left atrial size was moderately dilated.  4. The mitral valve is normal in structure. Mild mitral valve regurgitation. No evidence of mitral stenosis.  5. The aortic valve is normal in structure. Aortic valve regurgitation is mild. Mild to moderate aortic valve sclerosis/calcification is present, without any evidence of aortic stenosis.  6. Aortic dilatation noted. There is mild dilatation of the aortic root, measuring 39 mm.  7. The inferior vena cava is dilated in size with >50% respiratory variability, suggesting right atrial pressure of 8 mmHg. FINDINGS  Left Ventricle: Left ventricular ejection fraction, by estimation, is 40 to 45%. The left ventricle has mildly decreased function. The left ventricle has no regional wall motion abnormalities. The average left ventricular global longitudinal strain is -13.7 %. The global longitudinal strain is abnormal. The left ventricular internal cavity size was mildly dilated. There is moderate left ventricular hypertrophy. Left ventricular diastolic parameters are consistent with Grade I diastolic dysfunction (impaired relaxation). Right Ventricle: The right ventricular size is  normal. No increase in right ventricular wall thickness. Right ventricular systolic function is normal. Tricuspid regurgitation signal is inadequate for assessing PA pressure. Left Atrium: Left atrial size was moderately dilated. Right Atrium: Right atrial size was normal in size. Pericardium: There is no evidence of pericardial effusion. Mitral Valve: The mitral valve is normal in structure. Mild mitral valve regurgitation. No evidence of mitral valve stenosis. MV peak gradient, 6.5 mmHg. The mean mitral valve gradient is 2.0 mmHg. Tricuspid Valve: The tricuspid valve is normal in structure. Tricuspid valve regurgitation is not demonstrated. No evidence of tricuspid stenosis. Aortic Valve: The aortic valve is normal in structure. Aortic valve regurgitation is mild. Aortic regurgitation PHT measures 573 msec. Mild to moderate aortic valve sclerosis/calcification is present, without any evidence of aortic stenosis. Aortic valve  mean gradient measures 5.0 mmHg. Aortic valve peak gradient measures 9.6 mmHg. Aortic valve area, by VTI measures 4.25 cm. Pulmonic Valve: The pulmonic valve was normal in structure. Pulmonic valve regurgitation is trivial. No evidence of pulmonic stenosis. Aorta: Aortic dilatation noted. There is mild dilatation of the aortic root, measuring 39 mm. Venous: The inferior vena cava is dilated in size with greater than 50% respiratory variability, suggesting right atrial pressure of 8 mmHg. IAS/Shunts: No atrial level shunt detected by color flow Doppler.  LEFT VENTRICLE PLAX 2D LVIDd:         5.79 cm  Diastology LVIDs:         4.54 cm  LV e' medial:    3.59 cm/s LV PW:         1.38 cm  LV E/e' medial:  20.3 LV IVS:        0.87 cm  LV e' lateral:   7.07 cm/s LVOT diam:     2.70 cm  LV E/e' lateral: 10.3 LV SV:         129 LV SV Index:   75       2D Longitudinal Strain LVOT Area:     5.73 cm 2D Strain GLS Avg:     -13.7 %  RIGHT VENTRICLE RV Basal diam:  3.24  cm LEFT ATRIUM             Index        RIGHT ATRIUM           Index LA diam:        3.90 cm 2.26 cm/m  RA Area:     15.80 cm LA Vol (A2C):   56.9 ml 32.91 ml/m RA Volume:   37.50 ml  21.69 ml/m LA Vol (A4C):   80.9 ml 46.79 ml/m LA Biplane Vol: 69.2 ml 40.03 ml/m  AORTIC VALVE                    PULMONIC VALVE AV Area (Vmax):    3.77 cm     PV Vmax:       0.93 m/s AV Area (Vmean):   3.90 cm     PV Vmean:      62.500 cm/s AV Area (VTI):     4.25 cm     PV VTI:        0.211 m AV Vmax:           155.00 cm/s  PV Peak grad:  3.4 mmHg AV Vmean:          101.000 cm/s PV Mean grad:  2.0 mmHg AV VTI:            0.303 m AV Peak Grad:      9.6 mmHg AV Mean Grad:      5.0 mmHg LVOT Vmax:         102.00 cm/s LVOT Vmean:        68.800 cm/s LVOT VTI:          0.225 m LVOT/AV VTI ratio: 0.74 AI PHT:            573 msec  AORTA Ao Root diam: 3.90 cm MITRAL VALVE MV Area (PHT): 3.39 cm     SHUNTS MV Peak grad:  6.5 mmHg     Systemic VTI:  0.22 m MV Mean grad:  2.0 mmHg     Systemic Diam: 2.70 cm MV Vmax:       1.27 m/s MV Vmean:      66.5 cm/s MV Decel Time: 224 msec MV E velocity: 72.80 cm/s MV A velocity: 112.00 cm/s MV E/A ratio:  0.65 Kathlyn Sacramento MD Electronically signed by Kathlyn Sacramento MD Signature Date/Time: 05/12/2020/12:34:03 PM    Final      Scheduled Meds: . amLODipine  10 mg Oral Daily  . aspirin EC  81 mg Oral Daily  . [START ON 05/14/2020] calcitRIOL  0.25 mcg Oral Q M,W,F  . carvedilol  25 mg Oral BID WC  . cloNIDine  0.2 mg Oral TID  . clopidogrel  75 mg Oral Daily  . diltiazem  60 mg Oral Q8H  . doxazosin  8 mg Oral Daily  . ezetimibe  10 mg Oral Daily  . fluticasone  2 spray Each Nare Daily  . gentamicin cream  1 application Topical Daily  . heparin  5,000 Units Subcutaneous Q8H  . ipratropium-albuterol  3 mL Nebulization Q6H  . isosorbide mononitrate  30 mg Oral Daily  . multivitamin  1 tablet Oral QHS  . nicotine  21 mg Transdermal Daily  . rosuvastatin  20 mg Oral QPM  . sodium bicarbonate  325 mg Oral BID    Continuous Infusions: . dialysis solution 2.5% low-MG/low-CA    . dialysis solution 2.5% low-MG/low-CA  LOS: 0 days     Enzo Bi, MD [name] If 7PM-7AM, please contact night-coverage 05/12/2020, 7:55 PM

## 2020-05-13 LAB — GLUCOSE, CAPILLARY
Glucose-Capillary: 114 mg/dL — ABNORMAL HIGH (ref 70–99)
Glucose-Capillary: 135 mg/dL — ABNORMAL HIGH (ref 70–99)
Glucose-Capillary: 190 mg/dL — ABNORMAL HIGH (ref 70–99)

## 2020-05-13 LAB — CBC
HCT: 25.7 % — ABNORMAL LOW (ref 39.0–52.0)
Hemoglobin: 9.2 g/dL — ABNORMAL LOW (ref 13.0–17.0)
MCH: 31.6 pg (ref 26.0–34.0)
MCHC: 35.8 g/dL (ref 30.0–36.0)
MCV: 88.3 fL (ref 80.0–100.0)
Platelets: 158 10*3/uL (ref 150–400)
RBC: 2.91 MIL/uL — ABNORMAL LOW (ref 4.22–5.81)
RDW: 13.8 % (ref 11.5–15.5)
WBC: 9.8 10*3/uL (ref 4.0–10.5)
nRBC: 0 % (ref 0.0–0.2)

## 2020-05-13 LAB — BASIC METABOLIC PANEL
Anion gap: 14 (ref 5–15)
BUN: 53 mg/dL — ABNORMAL HIGH (ref 8–23)
CO2: 20 mmol/L — ABNORMAL LOW (ref 22–32)
Calcium: 8.6 mg/dL — ABNORMAL LOW (ref 8.9–10.3)
Chloride: 109 mmol/L (ref 98–111)
Creatinine, Ser: 7.34 mg/dL — ABNORMAL HIGH (ref 0.61–1.24)
GFR, Estimated: 7 mL/min — ABNORMAL LOW (ref >60)
Glucose, Bld: 102 mg/dL — ABNORMAL HIGH (ref 70–99)
Potassium: 3.5 mmol/L (ref 3.5–5.1)
Sodium: 143 mmol/L (ref 135–145)

## 2020-05-13 LAB — TROPONIN I (HIGH SENSITIVITY): Troponin I (High Sensitivity): 310 ng/L (ref <18)

## 2020-05-13 LAB — MAGNESIUM: Magnesium: 2.1 mg/dL (ref 1.7–2.4)

## 2020-05-13 NOTE — Progress Notes (Signed)
[facility], [place] 05/13/20  Subjective:   LOS: 1  Tolerated peritoneal dialysis well last night Currently on room air No acute complaints Able to tolerate diet  Objective:  Vital signs in last 24 hours:  Temp:  [97.7 F (36.5 C)-98 F (36.7 C)] 97.8 F (36.6 C) (12/21 1117) Pulse Rate:  [55-71] 63 (12/21 1117) Resp:  [10-18] 16 (12/21 1117) BP: (105-177)/(65-92) 173/72 (12/21 1117) SpO2:  [93 %-98 %] 96 % (12/21 1117) Weight:  [66.5 kg] 66.5 kg (12/20 1801)  Weight change: 2.132 kg Filed Weights   05/11/20 1039 05/12/20 1801  Weight: 64.4 kg 66.5 kg    Intake/Output:    Intake/Output Summary (Last 24 hours) at 05/13/2020 1348 Last data filed at 05/13/2020 0900 Gross per 24 hour  Intake 240 ml  Output --  Net 240 ml    Physical Exam: General:  No acute distress, laying in the bed  HEENT  anicteric, moist oral mucous membrane  Pulm/lungs  normal breathing effort, lungs are clear to auscultation  CVS/Heart  regular rhythm, no rub or gallop  Abdomen:   Soft, nontender  Extremities:  No peripheral edema  Neurologic:  Alert, oriented, able to follow commands  Skin:  No acute rashes  PD catheter in place    Basic Metabolic Panel:  Recent Labs  Lab 05/11/20 1042 05/12/20 1238 05/12/20 1807 05/13/20 0615  NA 141 141 140 143  K 4.4 3.8 4.1 3.5  CL 105 109 108 109  CO2 22 20* 21* 20*  GLUCOSE 139* 145* 154* 102*  BUN 59* 60* 58* 53*  CREATININE 7.19* 7.50* 7.41* 7.34*  CALCIUM 8.6* 8.7* 8.5* 8.6*  MG 2.2 2.2  --  2.1     CBC: Recent Labs  Lab 05/11/20 1042 05/12/20 1238 05/12/20 1807 05/13/20 0615  WBC 10.2 11.0* 8.9 9.8  HGB 9.2* 9.0* 8.7* 9.2*  HCT 26.3* 25.7* 25.4* 25.7*  MCV 90.7 89.9 90.4 88.3  PLT 166 147* 143* 158     No results found for: HEPBSAG, HEPBSAB, HEPBIGM    Microbiology:  Recent Results (from the past 240 hour(s))  Resp Panel by RT-PCR (Flu A&B, Covid) Nasopharyngeal Swab     Status:  None   Collection Time: 05/11/20 11:39 AM   Specimen: Nasopharyngeal Swab; Nasopharyngeal(NP) swabs in vial transport medium  Result Value Ref Range Status   SARS Coronavirus 2 by RT PCR NEGATIVE NEGATIVE Final    Comment: (NOTE) SARS-CoV-2 target nucleic acids are NOT DETECTED.  The SARS-CoV-2 RNA is generally detectable in upper respiratory specimens during the acute phase of infection. The lowest concentration of SARS-CoV-2 viral copies this assay can detect is 138 copies/mL. A negative result does not preclude SARS-Cov-2 infection and should not be used as the sole basis for treatment or other patient management decisions. A negative result may occur with  improper specimen collection/handling, submission of specimen other than nasopharyngeal swab, presence of viral mutation(s) within the areas targeted by this assay, and inadequate number of viral copies(<138 copies/mL). A negative result must be combined with clinical observations, patient history, and epidemiological information. The expected result is Negative.  Fact Sheet for Patients:  EntrepreneurPulse.com.au  Fact Sheet for Healthcare Providers:  IncredibleEmployment.be  This test is no t yet approved or cleared by the [place] FDA and  has been authorized for detection and/or diagnosis of SARS-CoV-2 by FDA under an Emergency Use Authorization (EUA). This EUA will remain  in effect (meaning this test can be used)  for the duration of the COVID-19 declaration under Section 564(b)(1) of the Act, 21 U.S.C.section 360bbb-3(b)(1), unless the authorization is terminated  or revoked sooner.       Influenza A by PCR NEGATIVE NEGATIVE Final   Influenza B by PCR NEGATIVE NEGATIVE Final    Comment: (NOTE) The Xpert Xpress SARS-CoV-2/FLU/RSV plus assay is intended as an aid in the diagnosis of influenza from Nasopharyngeal swab specimens and should not be used as a sole basis for  treatment. Nasal washings and aspirates are unacceptable for Xpert Xpress SARS-CoV-2/FLU/RSV testing.  Fact Sheet for Patients: EntrepreneurPulse.com.au  Fact Sheet for Healthcare Providers: IncredibleEmployment.be  This test is not yet approved or cleared by the [place] FDA and has been authorized for detection and/or diagnosis of SARS-CoV-2 by FDA under an Emergency Use Authorization (EUA). This EUA will remain in effect (meaning this test can be used) for the duration of the COVID-19 declaration under Section 564(b)(1) of the Act, 21 U.S.C. section 360bbb-3(b)(1), unless the authorization is terminated or revoked.  Performed at [facility], [address] [place],  [id number]     Coagulation Studies: No results for input(s): LABPROT, INR in the last 72 hours.  Urinalysis: No results for input(s): COLORURINE, LABSPEC, PHURINE, GLUCOSEU, HGBUR, BILIRUBINUR, KETONESUR, PROTEINUR, UROBILINOGEN, NITRITE, LEUKOCYTESUR in the last 72 hours.  Invalid input(s): APPERANCEUR    Imaging: ECHOCARDIOGRAM COMPLETE  Result Date: 05/12/2020    ECHOCARDIOGRAM REPORT   Patient Name:   [name] Date of Exam: 05/12/2020 Medical Rec #:  [id number]       Height:       66.0 in Accession #:    [id number]      Weight:       142.0 lb Date of Birth:  05/15/1946       BSA:          1.729 m Patient Age:    74 years        BP:           150/65 mmHg Patient Gender: M               HR:           68 bpm. Exam Location:  [facility] Procedure: 2D Echo, Color Doppler, Cardiac Doppler and Strain Analysis Indications:     Elevated troponin  History:         Patient has prior history of Echocardiogram examinations, most                  recent 01/14/2020. TIA and PAD; Risk Factors:Current Smoker,                  Hypertension, Dyslipidemia and Diabetes.  Sonographer:     Charmayne Sheer RDCS (AE) Referring Phys:  [id number] Arvil Chaco Diagnosing Phys: Kathlyn Sacramento MD  Sonographer Comments: Global longitudinal strain was attempted. IMPRESSIONS  1. Left ventricular ejection fraction, by estimation, is 40 to 45%. The left ventricle has mildly decreased function. The left ventricle has no regional wall motion abnormalities. The left ventricular internal cavity size was mildly dilated. There is moderate left ventricular hypertrophy. Left ventricular diastolic parameters are consistent with Grade I diastolic dysfunction (impaired relaxation). The average left ventricular global longitudinal strain is -13.7 %. The global longitudinal strain is abnormal.  2. Right ventricular systolic function is normal. The right ventricular size is normal. Tricuspid regurgitation signal is inadequate for assessing PA pressure.  3. Left atrial size was moderately dilated.  4. The mitral valve is normal in structure. Mild mitral valve regurgitation. No evidence of mitral stenosis.  5. The aortic valve is normal in structure. Aortic valve regurgitation is mild. Mild to moderate aortic valve sclerosis/calcification is present, without any evidence of aortic stenosis.  6. Aortic dilatation noted. There is mild dilatation of the aortic root, measuring 39 mm.  7. The inferior vena cava is dilated in size with >50% respiratory variability, suggesting right atrial pressure of 8 mmHg. FINDINGS  Left Ventricle: Left ventricular ejection fraction, by estimation, is 40 to 45%. The left ventricle has mildly decreased function. The left ventricle has no regional wall motion abnormalities. The average left ventricular global longitudinal strain is -13.7 %. The global longitudinal strain is abnormal. The left ventricular internal cavity size was mildly dilated. There is moderate left ventricular hypertrophy. Left ventricular diastolic parameters are consistent with Grade I diastolic dysfunction (impaired relaxation). Right Ventricle: The right ventricular size is normal. No increase in right ventricular wall  thickness. Right ventricular systolic function is normal. Tricuspid regurgitation signal is inadequate for assessing PA pressure. Left Atrium: Left atrial size was moderately dilated. Right Atrium: Right atrial size was normal in size. Pericardium: There is no evidence of pericardial effusion. Mitral Valve: The mitral valve is normal in structure. Mild mitral valve regurgitation. No evidence of mitral valve stenosis. MV peak gradient, 6.5 mmHg. The mean mitral valve gradient is 2.0 mmHg. Tricuspid Valve: The tricuspid valve is normal in structure. Tricuspid valve regurgitation is not demonstrated. No evidence of tricuspid stenosis. Aortic Valve: The aortic valve is normal in structure. Aortic valve regurgitation is mild. Aortic regurgitation PHT measures 573 msec. Mild to moderate aortic valve sclerosis/calcification is present, without any evidence of aortic stenosis. Aortic valve  mean gradient measures 5.0 mmHg. Aortic valve peak gradient measures 9.6 mmHg. Aortic valve area, by VTI measures 4.25 cm. Pulmonic Valve: The pulmonic valve was normal in structure. Pulmonic valve regurgitation is trivial. No evidence of pulmonic stenosis. Aorta: Aortic dilatation noted. There is mild dilatation of the aortic root, measuring 39 mm. Venous: The inferior vena cava is dilated in size with greater than 50% respiratory variability, suggesting right atrial pressure of 8 mmHg. IAS/Shunts: No atrial level shunt detected by color flow Doppler.  LEFT VENTRICLE PLAX 2D LVIDd:         5.79 cm  Diastology LVIDs:         4.54 cm  LV e' medial:    3.59 cm/s LV PW:         1.38 cm  LV E/e' medial:  20.3 LV IVS:        0.87 cm  LV e' lateral:   7.07 cm/s LVOT diam:     2.70 cm  LV E/e' lateral: 10.3 LV SV:         129 LV SV Index:   75       2D Longitudinal Strain LVOT Area:     5.73 cm 2D Strain GLS Avg:     -13.7 %  RIGHT VENTRICLE RV Basal diam:  3.24 cm LEFT ATRIUM             Index       RIGHT ATRIUM           Index LA diam:         3.90 cm 2.26 cm/m  RA Area:     15.80 cm LA Vol (A2C):   56.9 ml 32.91 ml/m RA Volume:   37.50 ml  21.69 ml/m LA Vol (A4C):   80.9 ml 46.79 ml/m LA Biplane Vol: 69.2 ml 40.03 ml/m  AORTIC VALVE                    PULMONIC VALVE AV Area (Vmax):    3.77 cm     PV Vmax:       0.93 m/s AV Area (Vmean):   3.90 cm     PV Vmean:      62.500 cm/s AV Area (VTI):     4.25 cm     PV VTI:        0.211 m AV Vmax:           155.00 cm/s  PV Peak grad:  3.4 mmHg AV Vmean:          101.000 cm/s PV Mean grad:  2.0 mmHg AV VTI:            0.303 m AV Peak Grad:      9.6 mmHg AV Mean Grad:      5.0 mmHg LVOT Vmax:         102.00 cm/s LVOT Vmean:        68.800 cm/s LVOT VTI:          0.225 m LVOT/AV VTI ratio: 0.74 AI PHT:            573 msec  AORTA Ao Root diam: 3.90 cm MITRAL VALVE MV Area (PHT): 3.39 cm     SHUNTS MV Peak grad:  6.5 mmHg     Systemic VTI:  0.22 m MV Mean grad:  2.0 mmHg     Systemic Diam: 2.70 cm MV Vmax:       1.27 m/s MV Vmean:      66.5 cm/s MV Decel Time: 224 msec MV E velocity: 72.80 cm/s MV A velocity: 112.00 cm/s MV E/A ratio:  0.65 Kathlyn Sacramento MD Electronically signed by Kathlyn Sacramento MD Signature Date/Time: 05/12/2020/12:34:03 PM    Final      Medications:   . dialysis solution 2.5% low-MG/low-CA Stopped (05/13/20 8938)  . dialysis solution 2.5% low-MG/low-CA Stopped (05/13/20 0606)   . amLODipine  10 mg Oral Daily  . aspirin EC  81 mg Oral Daily  . [START ON 05/14/2020] calcitRIOL  0.25 mcg Oral Q M,W,F  . carvedilol  25 mg Oral BID WC  . cloNIDine  0.2 mg Oral TID  . clopidogrel  75 mg Oral Daily  . doxazosin  8 mg Oral Daily  . ezetimibe  10 mg Oral Daily  . fluticasone  2 spray Each Nare Daily  . gentamicin cream  1 application Topical Daily  . heparin  5,000 Units Subcutaneous Q8H  . ipratropium-albuterol  3 mL Nebulization Q6H  . isosorbide mononitrate  30 mg Oral Daily  . multivitamin  1 tablet Oral QHS  . nicotine  21 mg Transdermal Daily  . rosuvastatin  20 mg  Oral QPM  . sodium bicarbonate  325 mg Oral BID   acetaminophen, dextromethorphan-guaiFENesin, diphenhydrAMINE, heparin, hydrALAZINE, levalbuterol  Assessment/ Plan:  74 y.o. male with end-stage renal disease on peritoneal dialysis, hypertension, hyperlipidemia, diabetes, COPD, stroke, history of renal cell carcinoma, status post left nephrectomy history of prostate cancer, CHF with EF 40 to 45% was admitted on 05/11/2020 for  Principal Problem:   Hypertensive urgency Active Problems:   Hyperlipidemia   tobacco abuse   Prostate cancer ([place])   DM (diabetes mellitus), type 2 with renal complications ([place])  Accelerated hypertension   Stroke ([place])   End stage renal disease (H[facility])   Elevated troponin   Acute on chronic systolic congestive heart failure (H[facility])   Fluid overload   Atrial fibrillation with rapid ventricular response ([facility])  Orthopnea [R06.01] Cough [R05.9] SOB (shortness of breath) [R06.02] Acute on chronic systolic congestive heart failure (HCC) [I50.23] Atrial fibrillation with rapid ventricular response ([place]) [I48.91] Hypertensive urgency [I16.0]  #. ESRD CCPD/67 kg/3 cycles 2000 cc/Dr kolluru Tolerated PD last night. Okay to discharge from renal standpoint and resume outpatient PD   #. Anemia of CKD  Lab Results  Component Value Date   HGB 9.2 (L) 05/13/2020   Continue Epogen regimen as outpatient  #. Secondary hyperparathyroidism of renal origin N 25.81      Component Value Date/Time   PTH 315 (H) 05/11/2017 1450   No results found for: PHOS Monitor calcium and phos level during this admission   #.  Shortness of breath in setting of chronic systolic CHF.  Uncontrolled hypertension.   EF 40 to 45%.  Cardiology evaluation in progress. Home regimen of amlodipine, carvedilol, clonidine, isosorbide, doxazosin    LOS: 1 Ciro Tashiro 12/21/20211:48 PM  [place], [place]

## 2020-05-13 NOTE — Progress Notes (Addendum)
Progress Note  Patient Name: [name] Date of Encounter: 05/13/2020  Primary Cardiologist: [name], MD   Subjective   No chest pain or shortness of breath at rest.   Reports some dizziness with lower pressures.  Able to lay flat today for several minutes during the exam.  No orthopnea, which is an improvement.  Ambulated earlier today with some dizziness/DOE but no chest pain.  Joined today by family. Most recent blood pressures reviewed with patient and family, as well as current medications.  Inpatient Medications    Scheduled Meds: . amLODipine  10 mg Oral Daily  . aspirin EC  81 mg Oral Daily  . [START ON 05/14/2020] calcitRIOL  0.25 mcg Oral Q M,W,F  . carvedilol  25 mg Oral BID WC  . cloNIDine  0.2 mg Oral TID  . clopidogrel  75 mg Oral Daily  . doxazosin  8 mg Oral Daily  . ezetimibe  10 mg Oral Daily  . fluticasone  2 spray Each Nare Daily  . gentamicin cream  1 application Topical Daily  . heparin  5,000 Units Subcutaneous Q8H  . isosorbide mononitrate  30 mg Oral Daily  . multivitamin  1 tablet Oral QHS  . nicotine  21 mg Transdermal Daily  . rosuvastatin  20 mg Oral QPM  . sodium bicarbonate  325 mg Oral BID   Continuous Infusions: . dialysis solution 2.5% low-MG/low-CA Stopped (05/13/20 6644)  . dialysis solution 2.5% low-MG/low-CA Stopped (05/13/20 0606)   PRN Meds: acetaminophen, dextromethorphan-guaiFENesin, diphenhydrAMINE, heparin, hydrALAZINE, levalbuterol   Vital Signs    Vitals:   05/12/20 1801 05/12/20 2003 05/13/20 0752 05/13/20 1117  BP: (!) 151/65 105/84 (!) 122/92 (!) 173/72  Pulse: (!) 56 (!) 55 68 63  Resp: 18 18 16 16   Temp: 97.7 F (36.5 C) 97.7 F (36.5 C) 98 F (36.7 C) 97.8 F (36.6 C)  TempSrc: Oral Oral Oral Oral  SpO2: 96% 98% 97% 96%  Weight: 66.5 kg     Height: 5\' 6"  (1.676 m)       Intake/Output Summary (Last 24 hours) at 05/13/2020 1456 Last data filed at 05/13/2020 1300 Gross per 24 hour   Intake 480 ml  Output --  Net 480 ml   Last 3 Weights 05/12/2020 05/11/2020 04/02/2020  Weight (lbs) 146 lb 11.2 oz 142 lb 148 lb  Weight (kg) 66.543 kg 64.411 kg 67.132 kg      Telemetry    Not on telemetry- Personally Reviewed  ECG    No new tracings- Personally Reviewed  Physical Exam   GEN: No acute distress.  Joined today by family. Neck: No JVD Cardiac: RRR, 2/6 systolic murmur best appreciated RUSB and early peaking.  No rubs or gallops.  Respiratory:  Coarse breath sounds bilaterally, expiratory wheeze GI: Soft, nontender, non-distended  MS: No edema; No deformity. Neuro:  Nonfocal  Psych: Normal affect   Labs    High Sensitivity Troponin:   Recent Labs  Lab 05/11/20 1352 05/11/20 2237 05/12/20 1238 05/12/20 2016 05/13/20 0615  TROPONINIHS 105* 177* 247* 319* 310*      Chemistry Recent Labs  Lab 05/12/20 1238 05/12/20 1807 05/13/20 0615  NA 141 140 143  K 3.8 4.1 3.5  CL 109 108 109  CO2 20* 21* 20*  GLUCOSE 145* 154* 102*  BUN 60* 58* 53*  CREATININE 7.50* 7.41* 7.34*  CALCIUM 8.7* 8.5* 8.6*  GFRNONAA 7* 7* 7*  ANIONGAP 12 11 14      Hematology  Recent Labs  Lab 05/12/20 1238 05/12/20 1807 05/13/20 0615  WBC 11.0* 8.9 9.8  RBC 2.86* 2.81* 2.91*  HGB 9.0* 8.7* 9.2*  HCT 25.7* 25.4* 25.7*  MCV 89.9 90.4 88.3  MCH 31.5 31.0 31.6  MCHC 35.0 34.3 35.8  RDW 13.7 13.9 13.8  PLT 147* 143* 158    BNP Recent Labs  Lab 05/11/20 1042  BNP 3,057.7*     DDimer No results for input(s): DDIMER in the last 168 hours.   Radiology    ECHOCARDIOGRAM COMPLETE  Result Date: 05/12/2020    ECHOCARDIOGRAM REPORT   Patient Name:   [name] Date of Exam: 05/12/2020 Medical Rec #:  [id number]       Height:       66.0 in Accession #:    [id number]      Weight:       142.0 lb Date of Birth:  [birth year]       BSA:          1.729 m Patient Age:    41 years        BP:           150/65 mmHg Patient Gender: M               HR:           68 bpm.  Exam Location:  [facility] Procedure: 2D Echo, Color Doppler, Cardiac Doppler and Strain Analysis Indications:     Elevated troponin  History:         Patient has prior history of Echocardiogram examinations, most                  recent 01/14/2020. TIA and PAD; Risk Factors:Current Smoker,                  Hypertension, Dyslipidemia and Diabetes.  Sonographer:     Charmayne Sheer RDCS (AE) Referring Phys:  [id number] Arvil Chaco Diagnosing Phys: Kathlyn Sacramento MD  Sonographer Comments: Global longitudinal strain was attempted. IMPRESSIONS  1. Left ventricular ejection fraction, by estimation, is 40 to 45%. The left ventricle has mildly decreased function. The left ventricle has no regional wall motion abnormalities. The left ventricular internal cavity size was mildly dilated. There is moderate left ventricular hypertrophy. Left ventricular diastolic parameters are consistent with Grade I diastolic dysfunction (impaired relaxation). The average left ventricular global longitudinal strain is -13.7 %. The global longitudinal strain is abnormal.  2. Right ventricular systolic function is normal. The right ventricular size is normal. Tricuspid regurgitation signal is inadequate for assessing PA pressure.  3. Left atrial size was moderately dilated.  4. The mitral valve is normal in structure. Mild mitral valve regurgitation. No evidence of mitral stenosis.  5. The aortic valve is normal in structure. Aortic valve regurgitation is mild. Mild to moderate aortic valve sclerosis/calcification is present, without any evidence of aortic stenosis.  6. Aortic dilatation noted. There is mild dilatation of the aortic root, measuring 39 mm.  7. The inferior vena cava is dilated in size with >50% respiratory variability, suggesting right atrial pressure of 8 mmHg. FINDINGS  Left Ventricle: Left ventricular ejection fraction, by estimation, is 40 to 45%. The left ventricle has mildly decreased function. The left ventricle has no regional  wall motion abnormalities. The average left ventricular global longitudinal strain is -13.7 %. The global longitudinal strain is abnormal. The left ventricular internal cavity size was mildly dilated. There is moderate left ventricular hypertrophy. Left ventricular  diastolic parameters are consistent with Grade I diastolic dysfunction (impaired relaxation). Right Ventricle: The right ventricular size is normal. No increase in right ventricular wall thickness. Right ventricular systolic function is normal. Tricuspid regurgitation signal is inadequate for assessing PA pressure. Left Atrium: Left atrial size was moderately dilated. Right Atrium: Right atrial size was normal in size. Pericardium: There is no evidence of pericardial effusion. Mitral Valve: The mitral valve is normal in structure. Mild mitral valve regurgitation. No evidence of mitral valve stenosis. MV peak gradient, 6.5 mmHg. The mean mitral valve gradient is 2.0 mmHg. Tricuspid Valve: The tricuspid valve is normal in structure. Tricuspid valve regurgitation is not demonstrated. No evidence of tricuspid stenosis. Aortic Valve: The aortic valve is normal in structure. Aortic valve regurgitation is mild. Aortic regurgitation PHT measures 573 msec. Mild to moderate aortic valve sclerosis/calcification is present, without any evidence of aortic stenosis. Aortic valve  mean gradient measures 5.0 mmHg. Aortic valve peak gradient measures 9.6 mmHg. Aortic valve area, by VTI measures 4.25 cm. Pulmonic Valve: The pulmonic valve was normal in structure. Pulmonic valve regurgitation is trivial. No evidence of pulmonic stenosis. Aorta: Aortic dilatation noted. There is mild dilatation of the aortic root, measuring 39 mm. Venous: The inferior vena cava is dilated in size with greater than 50% respiratory variability, suggesting right atrial pressure of 8 mmHg. IAS/Shunts: No atrial level shunt detected by color flow Doppler.  LEFT VENTRICLE PLAX 2D LVIDd:          5.79 cm  Diastology LVIDs:         4.54 cm  LV e' medial:    3.59 cm/s LV PW:         1.38 cm  LV E/e' medial:  20.3 LV IVS:        0.87 cm  LV e' lateral:   7.07 cm/s LVOT diam:     2.70 cm  LV E/e' lateral: 10.3 LV SV:         129 LV SV Index:   75       2D Longitudinal Strain LVOT Area:     5.73 cm 2D Strain GLS Avg:     -13.7 %  RIGHT VENTRICLE RV Basal diam:  3.24 cm LEFT ATRIUM             Index       RIGHT ATRIUM           Index LA diam:        3.90 cm 2.26 cm/m  RA Area:     15.80 cm LA Vol (A2C):   56.9 ml 32.91 ml/m RA Volume:   37.50 ml  21.69 ml/m LA Vol (A4C):   80.9 ml 46.79 ml/m LA Biplane Vol: 69.2 ml 40.03 ml/m  AORTIC VALVE                    PULMONIC VALVE AV Area (Vmax):    3.77 cm     PV Vmax:       0.93 m/s AV Area (Vmean):   3.90 cm     PV Vmean:      62.500 cm/s AV Area (VTI):     4.25 cm     PV VTI:        0.211 m AV Vmax:           155.00 cm/s  PV Peak grad:  3.4 mmHg AV Vmean:          101.000 cm/s PV Mean grad:  2.0 mmHg AV VTI:            0.303 m AV Peak Grad:      9.6 mmHg AV Mean Grad:      5.0 mmHg LVOT Vmax:         102.00 cm/s LVOT Vmean:        68.800 cm/s LVOT VTI:          0.225 m LVOT/AV VTI ratio: 0.74 AI PHT:            573 msec  AORTA Ao Root diam: 3.90 cm MITRAL VALVE MV Area (PHT): 3.39 cm     SHUNTS MV Peak grad:  6.5 mmHg     Systemic VTI:  0.22 m MV Mean grad:  2.0 mmHg     Systemic Diam: 2.70 cm MV Vmax:       1.27 m/s MV Vmean:      66.5 cm/s MV Decel Time: 224 msec MV E velocity: 72.80 cm/s MV A velocity: 112.00 cm/s MV E/A ratio:  0.65 Kathlyn Sacramento MD Electronically signed by Kathlyn Sacramento MD Signature Date/Time: 05/12/2020/12:34:03 PM    Final     Cardiac Studies   Echo 05/12/20 1. Left ventricular ejection fraction, by estimation, is 40 to 45%. The  left ventricle has mildly decreased function. The left ventricle has no  regional wall motion abnormalities. The left ventricular internal cavity  size was mildly dilated. There is  moderate  left ventricular hypertrophy. Left ventricular diastolic  parameters are consistent with Grade I diastolic dysfunction (impaired  relaxation). The average left ventricular global longitudinal strain is  -13.7 %. The global longitudinal strain is  abnormal.  2. Right ventricular systolic function is normal. The right ventricular  size is normal. Tricuspid regurgitation signal is inadequate for assessing  PA pressure.  3. Left atrial size was moderately dilated.  4. The mitral valve is normal in structure. Mild mitral valve  regurgitation. No evidence of mitral stenosis.  5. The aortic valve is normal in structure. Aortic valve regurgitation is  mild. Mild to moderate aortic valve sclerosis/calcification is present,  without any evidence of aortic stenosis.  6. Aortic dilatation noted. There is mild dilatation of the aortic root,  measuring 39 mm.  7. The inferior vena cava is dilated in size with >50% respiratory  variability, suggesting right atrial pressure of 8 mmHg.   Patient Profile     74 y.o. male with a hx of cardiomyopathy with moderately reduced LVEF, stroke, PAD, hypertension, hyperlipidemia, OSA, renal cell carcinoma s/p left nephrectomy, adenocarcinoma of the appendix, current tobacco use,and ESRD on PD, and who is being seen today for the evaluation of poorly controlled hypertension.  Assessment & Plan    Poorly controlled Hypertension --Poorly controled, likely exacerbated in the setting of volume overload.  It also was reported that he was not sticking to his PD sessions very well.  Currently on amlodipine 10mg , coreg 25mg , clonidine 0.2mg , cardizem 60mg , cardura 8mg  daily, PRN hydralazine, and Imdur 30mg .   As previously noted, avoid dropping blood pressure too quickly. Consider escalation of Imdur for further BP control. Hydralazine could also be added for further BP control. He does report dizziness with lower BP with slow position changes recommended.  Recommend  titrate antihypertensives as tolerated for increasing optimization of BP.  Most recent SBP 170s with rare SBP into the 110s to 120s.  CHF education reviewed.  Smoking cessation advised. Close OP follow-up.  Elevated HS Tn /supply demand ischemia --No  further chest tightess. MPI 01/2019 with small, mild, fixed basal and mid anterolateral defect-scar versus artifact with peri-infarc ischemia. HS Tn peaked 319. EKG without acute ST/T changes. Likely supply demand ischemia in the setting of rapid HR, poorly controlled BP, ESRD, and volume overload. Risk factors for cardiac ischemia include smoker and poorly controlled BP and HTN. Pt preference, however, has been for conservative management and in the setting of ESRD on PD.  Continue medical management. If further CP, this can be addressed in the OP setting. Smoking cessation advised.  Continue DAPT, BB, ARB, Imdur, and Crestor.  AOC HFrEF --Reports significant improvement in symptoms present at presentation 12/20.  Appears relatively euvolemic on exam today. Net -800cc 12/19 with output/daily weights poorly documented.  Replete electrolytes with current potassium 3.5.  Mg at goal.  Updated echo with EF 40 to 45% (unchanged), NRWMA, mild LVE, moderate LVH, G1 DD, GLS -13.7%. Continue PD per nephrology.  ReDS vest. CHF education recommended. Close OP follow-up.   Hypokalemia --K3.5.  Replete potassium with goal 4.0 as above.  Mild mitral regurgitation Mild aortic regurgitation /aortic valve sclerosis --Continue to monitor with periodic echo.  Heart rate and BP control recommended.  Mild dilation of the aortic root --Continue to monitor with periodic imaging.  Heart rate and BP control recommended, as well as ongoing LDL control.  Avoid FQ and heavy lifting.  Smoking cessation advised.  Sinus pauses --Telemetry significant for NSR, ST, SVT, PACs, and PVCs yesterday and not on telemetry today. No clear evidence of Afib as reported by EMS and thus no  indication for [facility].   HLD, goal LDL <70, poorly controlled --Continue Crestor and Zetia. Last LDL 111 and not at goal. Consider PCSK9i in the future for optimization of LDL.  Tobacco use --Smoking cessation recommended.  LE claudiction, PAD --Continue DAPT and statin, as well as to follow with vascular surgery.  Consider PCSK9 inhibitors in the future as above.  H/o CVA --Continue DAPT and statin. Could consider PCSK9i in the future.   For questions or updates, please contact [place] Please consult [email or web address] for contact info under        Signed, Arvil Chaco, PA-C  05/13/2020, 2:56 PM

## 2020-05-13 NOTE — Progress Notes (Signed)
PROGRESS NOTE    [name]  VOH:[id number] DOB: [birth year] DOA: 05/11/2020 PCP: Brett Mc, MD    Assessment & Plan:   Principal Problem:   Hypertensive urgency Active Problems:   Hyperlipidemia   tobacco abuse   Prostate cancer ([name])   DM (diabetes mellitus), type 2 with renal complications (HCC)   Accelerated hypertension   Stroke ([name])   End stage renal disease (HCC)   Elevated troponin   Acute on chronic systolic congestive heart failure (HCC)   Fluid overload   Atrial fibrillation with rapid ventricular response ([name])    [name] is a 74 y.o. male with medical history significant of ESRD on peritoneal dialysis, HTN, HLD, DM, COPD, stroke, tobacco abuse, PAD, OSA, renal cell carcinoma, prostate cancer, CHF with EF of 40-45%, who presents with shortness of breath and palpitation.   Hypertensive urgency, fluid overload and acute on chronic systolic congestive heart failure:  Patient has 1+ leg edema, crackles on auscultation, consistent with fluid overload and CHF exacerbation.  2D echo on 01/17/2019 showed EF of 40-45%.  BNP 3057.  Dr. Theador Hawthorne of nephrology consulted for dialysis. --Blood pressure 217/86.  Although wife reported at baseline pt's BP is often that high.   Plan: --cont home amlodipine, coreg, clonidine, Imdur --PD for volume removal  Hyperlipidemia -zetia and Crestor  Tobacco abuse -Nicotine patch  Hx of Prostate cancer ([name]) -on Cardura  Diet controled DM (diabetes mellitus), type 2 with renal complications [name]): Recent A1c 6.2, well controlled.  Patient is not taking medications.  Blood sugar 139 on admission. --no need for BG checks  Stroke (HCC) -Aspirin, Plavix, Crestor, Zetia  End stage renal disease on peritoneal dialysis --Pt has not been following his PD regimen as prescribed --Dr. Theador Hawthorne of nephrology is consulted --PD per nephrology  Elevated troponin due to demand ischemia trop peaked at 319.  Cardiology  consulted, ruled out ACS.  Possible A fib with RVR Per EMS, patient had new A. fib with RVR, with heart rate 150-170s. Patient was given 5 mg of metoprolol, converted to sinus rhythm at arrival to ED.  Overnight, pt received IV dilt 10 mg x1. Plan: --cont coreg --per cardiology, No clear evidence of Afib as reported by EMS and thus no indication for [facility].  Anemia of chronic disease --Hgb 8-9 --cont Epo per nephrology   DVT prophylaxis: Heparin SQ Code Status: Full code  Family Communication: wife updated at bedside today Status is: inpatient Dispo:   The patient is from: home Anticipated d/c is to: home Anticipated d/c date is: tomorrow   Subjective and Interval History:  Dyspnea improved.  No chest pain.  HR controlled.  BP varied widely on home BP meds.   Objective: Vitals:   05/12/20 2003 05/13/20 0752 05/13/20 1117 05/13/20 1545  BP: 105/84 (!) 122/92 (!) 173/72 (!) 177/62  Pulse: (!) 55 68 63 63  Resp: 18 16 16 16   Temp: 97.7 F (36.5 C) 98 F (36.7 C) 97.8 F (36.6 C) 98 F (36.7 C)  TempSrc: Oral Oral Oral Oral  SpO2: 98% 97% 96% 96%  Weight:      Height:        Intake/Output Summary (Last 24 hours) at 05/13/2020 1804 Last data filed at 05/13/2020 1300 Gross per 24 hour  Intake 480 ml  Output --  Net 480 ml   Filed Weights   05/11/20 1039 05/12/20 1801  Weight: 64.4 kg 66.5 kg    Examination:   Constitutional: NAD,  AAOx3 HEENT: conjunctivae and lids normal, EOMI CV: No cyanosis.   RESP: normal respiratory effort, on RA Extremities: No effusions, edema in BLE SKIN: warm, dry and intact Neuro: II - XII grossly intact.   Psych: Normal mood and affect.  Appropriate judgement and reason   Data Reviewed: I have personally reviewed following labs and imaging studies  CBC: Recent Labs  Lab 05/11/20 1042 05/12/20 1238 05/12/20 1807 05/13/20 0615  WBC 10.2 11.0* 8.9 9.8  HGB 9.2* 9.0* 8.7* 9.2*  HCT 26.3* 25.7* 25.4* 25.7*  MCV 90.7 89.9  90.4 88.3  PLT 166 147* 143* 440   Basic Metabolic Panel: Recent Labs  Lab 05/11/20 1042 05/12/20 1238 05/12/20 1807 05/13/20 0615  NA 141 141 140 143  K 4.4 3.8 4.1 3.5  CL 105 109 108 109  CO2 22 20* 21* 20*  GLUCOSE 139* 145* 154* 102*  BUN 59* 60* 58* 53*  CREATININE 7.19* 7.50* 7.41* 7.34*  CALCIUM 8.6* 8.7* 8.5* 8.6*  MG 2.2 2.2  --  2.1   GFR: Estimated Creatinine Clearance: 8 mL/min (A) (by C-G formula based on SCr of 7.34 mg/dL (H)). Liver Function Tests: No results for input(s): AST, ALT, ALKPHOS, BILITOT, PROT, ALBUMIN in the last 168 hours. No results for input(s): LIPASE, AMYLASE in the last 168 hours. No results for input(s): AMMONIA in the last 168 hours. Coagulation Profile: No results for input(s): INR, PROTIME in the last 168 hours. Cardiac Enzymes: No results for input(s): CKTOTAL, CKMB, CKMBINDEX, TROPONINI in the last 168 hours. BNP (last 3 results) No results for input(s): PROBNP in the last 8760 hours. HbA1C: Recent Labs    05/12/20 1238  HGBA1C 5.9*   CBG: Recent Labs  Lab 05/13/20 0753 05/13/20 1118 05/13/20 1551  GLUCAP 114* 135* 190*   Lipid Profile: Recent Labs    05/12/20 1238  CHOL 122  HDL 25*  LDLCALC 61  TRIG 180*  CHOLHDL 4.9   Thyroid Function Tests: Recent Labs    05/11/20 1352  TSH 1.555   Anemia Panel: No results for input(s): VITAMINB12, FOLATE, FERRITIN, TIBC, IRON, RETICCTPCT in the last 72 hours. Sepsis Labs: No results for input(s): PROCALCITON, LATICACIDVEN in the last 168 hours.  Recent Results (from the past 240 hour(s))  Resp Panel by RT-PCR (Flu A&B, Covid) Nasopharyngeal Swab     Status: None   Collection Time: 05/11/20 11:39 AM   Specimen: Nasopharyngeal Swab; Nasopharyngeal(NP) swabs in vial transport medium  Result Value Ref Range Status   SARS Coronavirus 2 by RT PCR NEGATIVE NEGATIVE Final    Comment: (NOTE) SARS-CoV-2 target nucleic acids are NOT DETECTED.  The SARS-CoV-2 RNA is  generally detectable in upper respiratory specimens during the acute phase of infection. The lowest concentration of SARS-CoV-2 viral copies this assay can detect is 138 copies/mL. A negative result does not preclude SARS-Cov-2 infection and should not be used as the sole basis for treatment or other patient management decisions. A negative result may occur with  improper specimen collection/handling, submission of specimen other than nasopharyngeal swab, presence of viral mutation(s) within the areas targeted by this assay, and inadequate number of viral copies(<138 copies/mL). A negative result must be combined with clinical observations, patient history, and epidemiological information. The expected result is Negative.  Fact Sheet for Patients:  EntrepreneurPulse.com.au  Fact Sheet for Healthcare Providers:  IncredibleEmployment.be  This test is no t yet approved or cleared by the [place] FDA and  has been authorized for detection and/or diagnosis  of SARS-CoV-2 by FDA under an Emergency Use Authorization (EUA). This EUA will remain  in effect (meaning this test can be used) for the duration of the COVID-19 declaration under Section 564(b)(1) of the Act, 21 U.S.C.section 360bbb-3(b)(1), unless the authorization is terminated  or revoked sooner.       Influenza A by PCR NEGATIVE NEGATIVE Final   Influenza B by PCR NEGATIVE NEGATIVE Final    Comment: (NOTE) The Xpert Xpress SARS-CoV-2/FLU/RSV plus assay is intended as an aid in the diagnosis of influenza from Nasopharyngeal swab specimens and should not be used as a sole basis for treatment. Nasal washings and aspirates are unacceptable for Xpert Xpress SARS-CoV-2/FLU/RSV testing.  Fact Sheet for Patients: EntrepreneurPulse.com.au  Fact Sheet for Healthcare Providers: IncredibleEmployment.be  This test is not yet approved or cleared by the [place] FDA and has been authorized for detection and/or diagnosis of SARS-CoV-2 by FDA under an Emergency Use Authorization (EUA). This EUA will remain in effect (meaning this test can be used) for the duration of the COVID-19 declaration under Section 564(b)(1) of the Act, 21 U.S.C. section 360bbb-3(b)(1), unless the authorization is terminated or revoked.  Performed at [facility], [address], [place], [place] [address]       Radiology Studies: ECHOCARDIOGRAM COMPLETE  Result Date: 05/12/2020    ECHOCARDIOGRAM REPORT   Patient Name:   VIRGEL HARO Date of Exam: 05/12/2020 Medical Rec #:  [id number]       Height:       66.0 in Accession #:    [id number]      Weight:       142.0 lb Date of Birth:  09-Dec-1945       BSA:          1.729 m Patient Age:    74 years        BP:           150/65 mmHg Patient Gender: M               HR:           68 bpm. Exam Location:  [facility] Procedure: 2D Echo, Color Doppler, Cardiac Doppler and Strain Analysis Indications:     Elevated troponin  History:         Patient has prior history of Echocardiogram examinations, most                  recent 01/14/2020. TIA and PAD; Risk Factors:Current Smoker,                  Hypertension, Dyslipidemia and Diabetes.  Sonographer:     Charmayne Sheer RDCS (AE) Referring Phys:  [id number] Arvil Chaco Diagnosing Phys: Kathlyn Sacramento MD  Sonographer Comments: Global longitudinal strain was attempted. IMPRESSIONS  1. Left ventricular ejection fraction, by estimation, is 40 to 45%. The left ventricle has mildly decreased function. The left ventricle has no regional wall motion abnormalities. The left ventricular internal cavity size was mildly dilated. There is moderate left ventricular hypertrophy. Left ventricular diastolic parameters are consistent with Grade I diastolic dysfunction (impaired relaxation). The average left ventricular global longitudinal strain is -13.7 %. The global longitudinal strain is abnormal.  2.  Right ventricular systolic function is normal. The right ventricular size is normal. Tricuspid regurgitation signal is inadequate for assessing PA pressure.  3. Left atrial size was moderately dilated.  4. The mitral valve is normal in structure. Mild mitral valve regurgitation. No evidence of  mitral stenosis.  5. The aortic valve is normal in structure. Aortic valve regurgitation is mild. Mild to moderate aortic valve sclerosis/calcification is present, without any evidence of aortic stenosis.  6. Aortic dilatation noted. There is mild dilatation of the aortic root, measuring 39 mm.  7. The inferior vena cava is dilated in size with >50% respiratory variability, suggesting right atrial pressure of 8 mmHg. FINDINGS  Left Ventricle: Left ventricular ejection fraction, by estimation, is 40 to 45%. The left ventricle has mildly decreased function. The left ventricle has no regional wall motion abnormalities. The average left ventricular global longitudinal strain is -13.7 %. The global longitudinal strain is abnormal. The left ventricular internal cavity size was mildly dilated. There is moderate left ventricular hypertrophy. Left ventricular diastolic parameters are consistent with Grade I diastolic dysfunction (impaired relaxation). Right Ventricle: The right ventricular size is normal. No increase in right ventricular wall thickness. Right ventricular systolic function is normal. Tricuspid regurgitation signal is inadequate for assessing PA pressure. Left Atrium: Left atrial size was moderately dilated. Right Atrium: Right atrial size was normal in size. Pericardium: There is no evidence of pericardial effusion. Mitral Valve: The mitral valve is normal in structure. Mild mitral valve regurgitation. No evidence of mitral valve stenosis. MV peak gradient, 6.5 mmHg. The mean mitral valve gradient is 2.0 mmHg. Tricuspid Valve: The tricuspid valve is normal in structure. Tricuspid valve regurgitation is not demonstrated.  No evidence of tricuspid stenosis. Aortic Valve: The aortic valve is normal in structure. Aortic valve regurgitation is mild. Aortic regurgitation PHT measures 573 msec. Mild to moderate aortic valve sclerosis/calcification is present, without any evidence of aortic stenosis. Aortic valve  mean gradient measures 5.0 mmHg. Aortic valve peak gradient measures 9.6 mmHg. Aortic valve area, by VTI measures 4.25 cm. Pulmonic Valve: The pulmonic valve was normal in structure. Pulmonic valve regurgitation is trivial. No evidence of pulmonic stenosis. Aorta: Aortic dilatation noted. There is mild dilatation of the aortic root, measuring 39 mm. Venous: The inferior vena cava is dilated in size with greater than 50% respiratory variability, suggesting right atrial pressure of 8 mmHg. IAS/Shunts: No atrial level shunt detected by color flow Doppler.  LEFT VENTRICLE PLAX 2D LVIDd:         5.79 cm  Diastology LVIDs:         4.54 cm  LV e' medial:    3.59 cm/s LV PW:         1.38 cm  LV E/e' medial:  20.3 LV IVS:        0.87 cm  LV e' lateral:   7.07 cm/s LVOT diam:     2.70 cm  LV E/e' lateral: 10.3 LV SV:         129 LV SV Index:   75       2D Longitudinal Strain LVOT Area:     5.73 cm 2D Strain GLS Avg:     -13.7 %  RIGHT VENTRICLE RV Basal diam:  3.24 cm LEFT ATRIUM             Index       RIGHT ATRIUM           Index LA diam:        3.90 cm 2.26 cm/m  RA Area:     15.80 cm LA Vol (A2C):   56.9 ml 32.91 ml/m RA Volume:   37.50 ml  21.69 ml/m LA Vol (A4C):   80.9 ml 46.79 ml/m LA Biplane Vol: 69.2  ml 40.03 ml/m  AORTIC VALVE                    PULMONIC VALVE AV Area (Vmax):    3.77 cm     PV Vmax:       0.93 m/s AV Area (Vmean):   3.90 cm     PV Vmean:      62.500 cm/s AV Area (VTI):     4.25 cm     PV VTI:        0.211 m AV Vmax:           155.00 cm/s  PV Peak grad:  3.4 mmHg AV Vmean:          101.000 cm/s PV Mean grad:  2.0 mmHg AV VTI:            0.303 m AV Peak Grad:      9.6 mmHg AV Mean Grad:      5.0 mmHg  LVOT Vmax:         102.00 cm/s LVOT Vmean:        68.800 cm/s LVOT VTI:          0.225 m LVOT/AV VTI ratio: 0.74 AI PHT:            573 msec  AORTA Ao Root diam: 3.90 cm MITRAL VALVE MV Area (PHT): 3.39 cm     SHUNTS MV Peak grad:  6.5 mmHg     Systemic VTI:  0.22 m MV Mean grad:  2.0 mmHg     Systemic Diam: 2.70 cm MV Vmax:       1.27 m/s MV Vmean:      66.5 cm/s MV Decel Time: 224 msec MV E velocity: 72.80 cm/s MV A velocity: 112.00 cm/s MV E/A ratio:  0.65 Kathlyn Sacramento MD Electronically signed by Kathlyn Sacramento MD Signature Date/Time: 05/12/2020/12:34:03 PM    Final      Scheduled Meds: . amLODipine  10 mg Oral Daily  . aspirin EC  81 mg Oral Daily  . [START ON 05/14/2020] calcitRIOL  0.25 mcg Oral Q M,W,F  . carvedilol  25 mg Oral BID WC  . cloNIDine  0.2 mg Oral TID  . clopidogrel  75 mg Oral Daily  . doxazosin  8 mg Oral Daily  . ezetimibe  10 mg Oral Daily  . fluticasone  2 spray Each Nare Daily  . gentamicin cream  1 application Topical Daily  . heparin  5,000 Units Subcutaneous Q8H  . isosorbide mononitrate  30 mg Oral Daily  . multivitamin  1 tablet Oral QHS  . nicotine  21 mg Transdermal Daily  . rosuvastatin  20 mg Oral QPM  . sodium bicarbonate  325 mg Oral BID   Continuous Infusions: . dialysis solution 2.5% low-MG/low-CA Stopped (05/13/20 8657)  . dialysis solution 2.5% low-MG/low-CA Stopped (05/13/20 0606)     LOS: 1 day     Enzo Bi, MD [name] If 7PM-7AM, please contact night-coverage 05/13/2020, 6:04 PM

## 2020-05-13 NOTE — TOC Initial Note (Signed)
Transition of Care [facility]) - Initial/Assessment Note    Patient Details  Name: [name] MRN: [id number] Date of Birth: 04-01-1946  Transition of [facility]) CM/SW Contact:    Magnus Ivan, LCSW Phone Number: 05/13/2020, 11:39 AM  Clinical Narrative:             This CSW met with patient at bedside for Readmission Screen. Patient lives with his wife and drives himself to appointments. PCP is [facility]. Pharmacy is [facility] in [place]. Patient does home dialysis. Denied HH, DME, or SNF history. Patient denied needs at this time.      Expected Discharge Plan: Home/Self Care Barriers to Discharge: Continued Medical Work up   Patient Goals and CMS Choice Patient states their goals for this hospitalization and ongoing recovery are:: home with self care CMS Medicare.gov Compare Post Acute Care list provided to:: Patient Choice offered to / list presented to : Patient  Expected Discharge Plan and Services Expected Discharge Plan: Home/Self Care       Living arrangements for the past 2 months: Single Family Home                                      Prior Living Arrangements/Services Living arrangements for the past 2 months: Single Family Home Lives with:: Spouse Patient language and need for interpreter reviewed:: Yes Do you feel safe going back to the place where you live?: Yes      Need for Family Participation in Patient Care: Yes (Comment) Care giver support system in place?: Yes (comment)   Criminal Activity/Legal Involvement Pertinent to Current Situation/Hospitalization: No - Comment as needed  Activities of Daily Living Home Assistive Devices/Equipment: None ADL Screening (condition at time of admission) Patient's cognitive ability adequate to safely complete daily activities?: Yes Is the patient deaf or have difficulty hearing?: No Does the patient have difficulty seeing, even when wearing glasses/contacts?: No Does the patient have difficulty  concentrating, remembering, or making decisions?: No Patient able to express need for assistance with ADLs?: Yes Does the patient have difficulty dressing or bathing?: No Independently performs ADLs?: Yes (appropriate for developmental age) Does the patient have difficulty walking or climbing stairs?: No Weakness of Legs: None Weakness of Arms/Hands: None  Permission Sought/Granted Permission sought to share information with : Chartered certified accountant granted to share information with : Yes, Verbal Permission Granted     Permission granted to share info w AGENCY: as needed        Emotional Assessment       Orientation: : Oriented to Self,Oriented to Place,Oriented to  Time,Oriented to Situation Alcohol / Substance Use: Not Applicable Psych Involvement: No (comment)  Admission diagnosis:  Orthopnea [R06.01] Cough [R05.9] SOB (shortness of breath) [R06.02] Acute on chronic systolic congestive heart failure (HCC) [I50.23] Atrial fibrillation with rapid ventricular response (HCC) [I48.91] Hypertensive urgency [I16.0] Patient Active Problem List   Diagnosis Date Noted  . Atrial fibrillation with rapid ventricular response ([place]) 05/12/2020  . Hypertensive urgency 05/11/2020  . Chronic systolic CHF (congestive heart failure) ([place]) 05/11/2020  . Elevated troponin 05/11/2020  . Fluid overload 05/11/2020  . Acute on chronic systolic congestive heart failure ([place])   . End stage renal disease ([place]) 02/19/2020  . Aortic arch atherosclerosis ([place]) 01/22/2020  . Hospital discharge follow-up 01/22/2020  . Urinary retention 01/22/2020  . Neurologic deficit as late effect of ischemic cerebrovascular accident (CVA) 01/15/2020  .  Dissection of left subclavian artery ([place])   . Stroke ([place]) 01/14/2020  . Constipation 05/13/2019  . Cardiomyopathy ([place]) 04/26/2019  . Coronary artery disease involving native coronary artery of native heart without angina pectoris 04/26/2019   . ESRD (end stage renal disease) ([place]) 04/26/2019  . Chronic pain not due to malignancy 04/12/2019  . Chronic bilateral low back pain with bilateral sciatica 11/27/2018  . Synovial cyst of lumbar spine 11/27/2018  . Accelerated hypertension 11/14/2018  . CKD (chronic kidney disease), stage V ([place]) 11/14/2018  . Major depressive disorder with current active episode 07/26/2017  . Anemia of chronic kidney failure, stage 4 (severe) ([place]) 07/26/2017  . COPD exacerbation ([place]) 07/26/2017  . CKD (chronic kidney disease), stage IV ([place]) 11/25/2016  . DOE (dyspnea on exertion) 11/25/2016  . Sciatica of right side 07/20/2015  . DM (diabetes mellitus), type 2 with renal complications ([place]) 41/28/7867  . S/P UVPP (uvulopalatopharyngoplasty) 08/22/2014  . Presbyacusis 08/22/2014  . Adenocarcinoma, renal cell ([place]) 02/26/2014  . Renal cell carcinoma ([place]) 02/26/2014  . Malignant neoplasm of kidney excluding renal pelvis ([place]) 02/26/2014  . Peripheral arterial occlusive disease ([place]) 02/26/2014  . Elevated alkaline phosphatase measurement 07/30/2013  . Prostate CA ([place]) 07/30/2013  . Prostate cancer ([place]) 07/30/2013  . Elevated serum alkaline phosphatase level 07/30/2013  . Malignant neoplasm of prostate ([place]) 07/30/2013  . Statin intolerance 04/26/2013  . Hypertensive renal sclerosis with hypertension 10/18/2012  . Renal sclerosis with hypertension 10/18/2012  . Complication of diabetes mellitus ([place]) 07/16/2012  . PAD (peripheral artery disease) ([place])   . Left hip pain 04/20/2012  . Erectile dysfunction 04/20/2012  . Chronic kidney disease, stage III (moderate) ([place]) 04/20/2012  . Hip pain 04/20/2012  . tobacco abuse   . TIA (transient ischemic attack) 01/15/2012  . Temporary cerebral vascular dysfunction 01/15/2012  . Tobacco abuse counseling 05/06/2011  . Counseling on substance use and abuse 05/06/2011  . Hyperlipidemia   . History of renal cell carcinoma    PCP:  Crecencio Mc,  MD Pharmacy:   [facility], [place] [address] Phone: [phone] Fax: [phone]     Social Determinants of Health (SDOH) Interventions    Readmission Risk Interventions No flowsheet data found.

## 2020-05-14 ENCOUNTER — Telehealth: Payer: Self-pay | Admitting: Internal Medicine

## 2020-05-14 DIAGNOSIS — E785 Hyperlipidemia, unspecified: Secondary | ICD-10-CM

## 2020-05-14 DIAGNOSIS — E1169 Type 2 diabetes mellitus with other specified complication: Secondary | ICD-10-CM

## 2020-05-14 DIAGNOSIS — D638 Anemia in other chronic diseases classified elsewhere: Secondary | ICD-10-CM

## 2020-05-14 DIAGNOSIS — I160 Hypertensive urgency: Principal | ICD-10-CM

## 2020-05-14 DIAGNOSIS — N186 End stage renal disease: Secondary | ICD-10-CM

## 2020-05-14 DIAGNOSIS — D631 Anemia in chronic kidney disease: Secondary | ICD-10-CM | POA: Diagnosis not present

## 2020-05-14 DIAGNOSIS — D509 Iron deficiency anemia, unspecified: Secondary | ICD-10-CM | POA: Diagnosis not present

## 2020-05-14 DIAGNOSIS — Z992 Dependence on renal dialysis: Secondary | ICD-10-CM | POA: Diagnosis not present

## 2020-05-14 DIAGNOSIS — I5020 Unspecified systolic (congestive) heart failure: Secondary | ICD-10-CM

## 2020-05-14 DIAGNOSIS — I480 Paroxysmal atrial fibrillation: Secondary | ICD-10-CM

## 2020-05-14 DIAGNOSIS — I5023 Acute on chronic systolic (congestive) heart failure: Secondary | ICD-10-CM

## 2020-05-14 LAB — CBC
HCT: 24.9 % — ABNORMAL LOW (ref 39.0–52.0)
Hemoglobin: 8.6 g/dL — ABNORMAL LOW (ref 13.0–17.0)
MCH: 31.3 pg (ref 26.0–34.0)
MCHC: 34.5 g/dL (ref 30.0–36.0)
MCV: 90.5 fL (ref 80.0–100.0)
Platelets: 147 10*3/uL — ABNORMAL LOW (ref 150–400)
RBC: 2.75 MIL/uL — ABNORMAL LOW (ref 4.22–5.81)
RDW: 13.7 % (ref 11.5–15.5)
WBC: 9 10*3/uL (ref 4.0–10.5)
nRBC: 0 % (ref 0.0–0.2)

## 2020-05-14 LAB — BASIC METABOLIC PANEL
Anion gap: 10 (ref 5–15)
BUN: 49 mg/dL — ABNORMAL HIGH (ref 8–23)
CO2: 25 mmol/L (ref 22–32)
Calcium: 8.2 mg/dL — ABNORMAL LOW (ref 8.9–10.3)
Chloride: 109 mmol/L (ref 98–111)
Creatinine, Ser: 7.28 mg/dL — ABNORMAL HIGH (ref 0.61–1.24)
GFR, Estimated: 7 mL/min — ABNORMAL LOW (ref >60)
Glucose, Bld: 109 mg/dL — ABNORMAL HIGH (ref 70–99)
Potassium: 3.9 mmol/L (ref 3.5–5.1)
Sodium: 144 mmol/L (ref 135–145)

## 2020-05-14 LAB — MAGNESIUM: Magnesium: 2.1 mg/dL (ref 1.7–2.4)

## 2020-05-14 MED ORDER — HYDRALAZINE HCL 50 MG PO TABS
25.0000 mg | ORAL_TABLET | Freq: Three times a day (TID) | ORAL | Status: DC
  Administered 2020-05-14: 25 mg via ORAL
  Filled 2020-05-14: qty 1

## 2020-05-14 MED ORDER — ISOSORBIDE MONONITRATE ER 30 MG PO TB24: 30.0000 mg | ORAL_TABLET | Freq: Every day | ORAL | Status: DC

## 2020-05-14 MED ORDER — SALINE SPRAY 0.65 % NA SOLN: 1.0000 | NASAL | 0 refills | Status: DC | PRN

## 2020-05-14 MED ORDER — ISOSORBIDE MONONITRATE ER 30 MG PO TB24
60.0000 mg | ORAL_TABLET | Freq: Every day | ORAL | Status: DC
  Administered 2020-05-14: 09:00:00 60 mg via ORAL
  Filled 2020-05-14: qty 2

## 2020-05-14 MED ORDER — GENTAMICIN SULFATE 0.1 % EX CREA: 1.0000 "application " | TOPICAL_CREAM | Freq: Every day | CUTANEOUS | 0 refills | Status: DC

## 2020-05-14 MED ORDER — SALINE SPRAY 0.65 % NA SOLN
1.0000 | NASAL | Status: DC | PRN
  Filled 2020-05-14 (×2): qty 44

## 2020-05-14 MED ORDER — HYDRALAZINE HCL 25 MG PO TABS: 25.0000 mg | ORAL_TABLET | Freq: Three times a day (TID) | ORAL | 0 refills | Status: DC

## 2020-05-14 NOTE — Progress Notes (Signed)
Patient discharged, received paperwork

## 2020-05-14 NOTE — Progress Notes (Signed)
[facility], [place] 05/14/20  Subjective:   LOS: 2  Tolerated peritoneal dialysis well last night Currently on room air No acute complaints Able to tolerate diet  Objective:  Vital signs in last 24 hours:  Temp:  [97.4 F (36.3 C)-98.4 F (36.9 C)] 97.4 F (36.3 C) (12/22 1147) Pulse Rate:  [57-68] 57 (12/22 1147) Resp:  [16-18] 16 (12/22 1147) BP: (155-195)/(62-78) 155/69 (12/22 1147) SpO2:  [96 %-98 %] 98 % (12/22 1147)  Weight change:  Filed Weights   05/11/20 1039 05/12/20 1801  Weight: 64.4 kg 66.5 kg    Intake/Output:    Intake/Output Summary (Last 24 hours) at 05/14/2020 1516 Last data filed at 05/13/2020 1855 Gross per 24 hour  Intake 0 ml  Output --  Net 0 ml    Physical Exam: General:  No acute distress, laying in the bed  HEENT  anicteric, moist oral mucous membrane  Pulm/lungs  normal breathing effort, lungs are clear to auscultation  CVS/Heart  regular rhythm, no rub or gallop  Abdomen:   Soft, nontender  Extremities:  No peripheral edema  Neurologic:  Alert, oriented, able to follow commands  Skin:  No acute rashes  PD catheter in place    Basic Metabolic Panel:  Recent Labs  Lab 05/11/20 1042 05/12/20 1238 05/12/20 1807 05/13/20 0615 05/14/20 0338  NA 141 141 140 143 144  K 4.4 3.8 4.1 3.5 3.9  CL 105 109 108 109 109  CO2 22 20* 21* 20* 25  GLUCOSE 139* 145* 154* 102* 109*  BUN 59* 60* 58* 53* 49*  CREATININE 7.19* 7.50* 7.41* 7.34* 7.28*  CALCIUM 8.6* 8.7* 8.5* 8.6* 8.2*  MG 2.2 2.2  --  2.1 2.1     CBC: Recent Labs  Lab 05/11/20 1042 05/12/20 1238 05/12/20 1807 05/13/20 0615 05/14/20 0338  WBC 10.2 11.0* 8.9 9.8 9.0  HGB 9.2* 9.0* 8.7* 9.2* 8.6*  HCT 26.3* 25.7* 25.4* 25.7* 24.9*  MCV 90.7 89.9 90.4 88.3 90.5  PLT 166 147* 143* 158 147*     No results found for: HEPBSAG, HEPBSAB, HEPBIGM    Microbiology:  Recent Results (from the past 240 hour(s))  Resp Panel by RT-PCR (Flu  A&B, Covid) Nasopharyngeal Swab     Status: None   Collection Time: 05/11/20 11:39 AM   Specimen: Nasopharyngeal Swab; Nasopharyngeal(NP) swabs in vial transport medium  Result Value Ref Range Status   SARS Coronavirus 2 by RT PCR NEGATIVE NEGATIVE Final    Comment: (NOTE) SARS-CoV-2 target nucleic acids are NOT DETECTED.  The SARS-CoV-2 RNA is generally detectable in upper respiratory specimens during the acute phase of infection. The lowest concentration of SARS-CoV-2 viral copies this assay can detect is 138 copies/mL. A negative result does not preclude SARS-Cov-2 infection and should not be used as the sole basis for treatment or other patient management decisions. A negative result may occur with  improper specimen collection/handling, submission of specimen other than nasopharyngeal swab, presence of viral mutation(s) within the areas targeted by this assay, and inadequate number of viral copies(<138 copies/mL). A negative result must be combined with clinical observations, patient history, and epidemiological information. The expected result is Negative.  Fact Sheet for Patients:  EntrepreneurPulse.com.au  Fact Sheet for Healthcare Providers:  IncredibleEmployment.be  This test is no t yet approved or cleared by the [place] FDA and  has been authorized for detection and/or diagnosis of SARS-CoV-2 by FDA under an Emergency Use Authorization (EUA). This EUA will remain  in effect (meaning this test can be used) for the duration of the COVID-19 declaration under Section 564(b)(1) of the Act, 21 U.S.C.section 360bbb-3(b)(1), unless the authorization is terminated  or revoked sooner.       Influenza A by PCR NEGATIVE NEGATIVE Final   Influenza B by PCR NEGATIVE NEGATIVE Final    Comment: (NOTE) The Xpert Xpress SARS-CoV-2/FLU/RSV plus assay is intended as an aid in the diagnosis of influenza from Nasopharyngeal swab specimens  and should not be used as a sole basis for treatment. Nasal washings and aspirates are unacceptable for Xpert Xpress SARS-CoV-2/FLU/RSV testing.  Fact Sheet for Patients: EntrepreneurPulse.com.au  Fact Sheet for Healthcare Providers: IncredibleEmployment.be  This test is not yet approved or cleared by the [place] FDA and has been authorized for detection and/or diagnosis of SARS-CoV-2 by FDA under an Emergency Use Authorization (EUA). This EUA will remain in effect (meaning this test can be used) for the duration of the COVID-19 declaration under Section 564(b)(1) of the Act, 21 U.S.C. section 360bbb-3(b)(1), unless the authorization is terminated or revoked.  Performed at [facility], [address] [place], [place] [address]     Coagulation Studies: No results for input(s): LABPROT, INR in the last 72 hours.  Urinalysis: No results for input(s): COLORURINE, LABSPEC, PHURINE, GLUCOSEU, HGBUR, BILIRUBINUR, KETONESUR, PROTEINUR, UROBILINOGEN, NITRITE, LEUKOCYTESUR in the last 72 hours.  Invalid input(s): APPERANCEUR    Imaging: No results found.   Medications:   . dialysis solution 2.5% low-MG/low-CA Stopped (05/14/20 0300)  . dialysis solution 2.5% low-MG/low-CA Stopped (05/14/20 0300)   . amLODipine  10 mg Oral Daily  . aspirin EC  81 mg Oral Daily  . calcitRIOL  0.25 mcg Oral Q M,W,F  . carvedilol  25 mg Oral BID WC  . cloNIDine  0.2 mg Oral TID  . clopidogrel  75 mg Oral Daily  . doxazosin  8 mg Oral Daily  . ezetimibe  10 mg Oral Daily  . fluticasone  2 spray Each Nare Daily  . gentamicin cream  1 application Topical Daily  . heparin  5,000 Units Subcutaneous Q8H  . hydrALAZINE  25 mg Oral TID  . ipratropium-albuterol  3 mL Nebulization Q6H  . [START ON 05/15/2020] isosorbide mononitrate  30 mg Oral Daily  . multivitamin  1 tablet Oral QHS  . nicotine  21 mg Transdermal Daily  . rosuvastatin  20 mg Oral  QPM  . sodium bicarbonate  325 mg Oral BID   acetaminophen, dextromethorphan-guaiFENesin, diphenhydrAMINE, heparin, hydrALAZINE, levalbuterol, sodium chloride  Assessment/ Plan:  74 y.o. male with end-stage renal disease on peritoneal dialysis, hypertension, hyperlipidemia, diabetes, COPD, stroke, history of renal cell carcinoma, status post left nephrectomy history of prostate cancer, CHF with EF 40 to 45% was admitted on 05/11/2020 for  Principal Problem:   Hypertensive urgency Active Problems:   Hyperlipidemia   tobacco abuse   Prostate cancer ([place])   DM (diabetes mellitus), type 2 with renal complications (HCC)   Accelerated hypertension   Stroke (HCC)   End stage renal disease (HCC)   Elevated troponin   Acute on chronic systolic congestive heart failure (HCC)   Fluid overload   Atrial fibrillation with rapid ventricular response (HCC)   HFrEF (heart failure with reduced ejection fraction) (HCC)  Orthopnea [R06.01] Cough [R05.9] SOB (shortness of breath) [R06.02] Acute on chronic systolic congestive heart failure (HCC) [I50.23] Atrial fibrillation with rapid ventricular response ([place]) [I48.91] Hypertensive urgency [I16.0]  #. ESRD CCPD/67 kg/3 cycles 2000 cc/Dr  kolluru Tolerated PD last night. Okay to discharge from renal standpoint and resume outpatient PD   #. Anemia of CKD  Lab Results  Component Value Date   HGB 8.6 (L) 05/14/2020   Continue Epogen regimen as outpatient  #. Secondary hyperparathyroidism of renal origin N 25.81      Component Value Date/Time   PTH 315 (H) 05/11/2017 1450   No results found for: PHOS Monitor calcium and phos level during this admission   #.  Shortness of breath in setting of chronic systolic CHF.  Uncontrolled hypertension.   EF 40 to 45%.  Cardiology evaluation in progress. Home regimen of amlodipine, carvedilol, clonidine, isosorbide, doxazosin Added hydralazine   LOS: [place] 12/22/20213:16 PM  [place], [place]

## 2020-05-14 NOTE — Telephone Encounter (Signed)
There is no appts. Is there anywhere that you would like to work him in next week?

## 2020-05-14 NOTE — Telephone Encounter (Signed)
12:00 on Tuesday dec 28 he will need the entire hour

## 2020-05-14 NOTE — Discharge Summary (Signed)
[facility] [place] NAME: [name]    MR#:  [id number]  DATE OF BIRTH:  1945-09-19  DATE OF ADMISSION:  05/11/2020 ADMITTING PHYSICIAN: Enzo Bi, MD  DATE OF DISCHARGE: 05/14/2020  3:16 PM  PRIMARY CARE PHYSICIAN: Crecencio Mc, MD    ADMISSION DIAGNOSIS:  Orthopnea [R06.01] Cough [R05.9] SOB (shortness of breath) [R06.02] Acute on chronic systolic congestive heart failure (HCC) [I50.23] Atrial fibrillation with rapid ventricular response ([place]) [I48.91] Hypertensive urgency [I16.0]  DISCHARGE DIAGNOSIS:  Principal Problem:   Hypertensive urgency Active Problems:   Hyperlipidemia   tobacco abuse   Prostate cancer ([place])   DM (diabetes mellitus), type 2 with renal complications (HCC)   Accelerated hypertension   Stroke ([place])   End stage renal disease (HCC)   Elevated troponin   Acute on chronic systolic congestive heart failure (HCC)   Fluid overload   Atrial fibrillation with rapid ventricular response (HCC)   HFrEF (heart failure with reduced ejection fraction) ([place])   SECONDARY DIAGNOSIS:   Past Medical History:  Diagnosis Date  . 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 Cardiomyopathy ([place])    a. 12/2018 Echo: EF 40-45%, global HK. Asc Ao 3.7cm; b. 01/2019 Lexi MV: small, mild, fixed basal and mid antlat defect - scar vs artifact. Small, mild mid and apical inf minimally reversible defect, likely scar w/ peri-infarct ischemia. Coronary and Ao atherosclerosis; c. 12/2019 Echo: EF 40-45%, glob HK, Gr1 DD. Nl RV fxn. Sev dil LA. Mild MR. Mild-mod Ao sclerosis w/o stenosis. Asc Ao 50mm.  . Carotid arterial disease ([place])    a. 01/2020 RICA 1-39%, RCCA/RECA < 70%, LICA 1-77%, LCCA/LECA <50%.  . Claudication ([place])    a. 02/2020 ABI/TBI: R 1.08/0.95, L 1.00/0.70.  Marland Kitchen Complication of anesthesia    had to be woken up slowly as his bp was elevated when did this quickly  . Diabetes mellitus  without complication ([place])   . ESRD (end stage renal disease) ([place])    a. Peritoneal Dialysis pt.  . Hyperlipidemia   . Hypertension   . Migraine    cluster  . Neuromuscular disorder ([place])    left lower extrem neuropathy  . Obstructive sleep apnea    no OSA since had facial surgery with dr. Kathyrn Sheriff in 1997  . PAD (peripheral artery disease) [facility]) Feb 2009   nonobstructing, renal angiogram ([place])  . Renal cell carcinoma 2004   left kidney heminephrectomy  . Renal insufficiency   . Stroke [facility])    a. 12/2019 MRI: Small acute infarcts involving the left cerebral hemisphere and several chronic infarcts.  Marland Kitchen TIA (transient ischemic attack)    no residual but left leg and foot still feel heavy  . tobacco abuse     HOSPITAL COURSE:   1.  Hypertensive urgency on presentation.  Blood pressure very variable during the hospital course.  Patient states that they had to take his blood pressure about 6 times this morning and had to use a smaller cuff down at a wrist get a blood pressure reading.  Blood pressure upon disposition 155/69.  Hydralazine 25 mg 3 times daily added to his usual regimen.  Continue amlodipine Coreg clonidine and Imdur. 2.  Acute on chronic systolic congestive heart failure.  The patient is on peritoneal dialysis.  Patient on Coreg.  Has allergy listed to ARB with hyperkalemia.  Spironolactone not a good medication and dialysis patients. 3.  End-stage  renal disease on peritoneal dialysis 4.  Brief paroxysmal atrial fibrillation.  Seen by cardiology and there was no evidence on EMS report.  Holding off on anticoagulation. 5.  Anemia of chronic disease 6.  History of stroke on aspirin Plavix Crestor and Zetia 7.  Type 2 diabetes mellitus with hyperlipidemia on Crestor and Zetia diet controlled.  Hemoglobin A1c actually low at 5.9  DISCHARGE CONDITIONS:   Satisfactory  CONSULTS OBTAINED:  Treatment Team:  Kate Sable, MD  DRUG ALLERGIES:   Allergies  Allergen  Reactions  . Irbesartan Other (See Comments)    hyperkalemia  . Cymbalta [Duloxetine Hcl]   . Atorvastatin Other (See Comments)    Muscle pain  . Bystolic [Nebivolol Hcl]     Extreme fatigue     DISCHARGE MEDICATIONS:   Allergies as of 05/14/2020      Reactions   Irbesartan Other (See Comments)   hyperkalemia   Cymbalta [duloxetine Hcl]    Atorvastatin Other (See Comments)   Muscle pain   Bystolic [nebivolol Hcl]    Extreme fatigue       Medication List    STOP taking these medications   gabapentin 100 MG capsule Commonly known as: NEURONTIN     TAKE these medications   albuterol 108 (90 Base) MCG/ACT inhaler Commonly known as: VENTOLIN HFA Inhale 2 puffs into the lungs every 6 (six) hours as needed for wheezing or shortness of breath.   amLODipine 10 MG tablet Commonly known as: NORVASC TAKE 1 TABLET BY MOUTH ONCE DAILY   aspirin 81 MG tablet Take 1 tablet (81 mg total) by mouth daily.   calcitRIOL 0.25 MCG capsule Commonly known as: ROCALTROL Take 0.25 mcg by mouth every Monday, Wednesday, and Friday.   carvedilol 25 MG tablet Commonly known as: COREG Take 25 mg by mouth 2 (two) times daily with a meal.   cloNIDine 0.2 MG tablet Commonly known as: CATAPRES Take 0.2 mg by mouth 3 (three) times daily.   clopidogrel 75 MG tablet Commonly known as: PLAVIX Take 1 tablet (75 mg total) by mouth daily.   diphenhydrAMINE 25 MG tablet Commonly known as: BENADRYL Take 25 mg by mouth every 6 (six) hours as needed for allergies.   doxazosin 8 MG tablet Commonly known as: CARDURA Take 8 mg by mouth daily.   ezetimibe 10 MG tablet Commonly known as: ZETIA Take 1 tablet (10 mg total) by mouth daily.   fluticasone 50 MCG/ACT nasal spray Commonly known as: FLONASE Place 2 sprays into both nostrils daily.   gentamicin cream 0.1 % Commonly known as: GARAMYCIN Apply 1 application topically daily.   hydrALAZINE 25 MG tablet Commonly known as:  APRESOLINE Take 1 tablet (25 mg total) by mouth 3 (three) times daily.   isosorbide mononitrate 30 MG 24 hr tablet Commonly known as: IMDUR Take 1 tablet (30 mg total) by mouth daily.   multivitamin Tabs tablet Take 1 tablet by mouth at bedtime.   nicotine 14 mg/24hr patch Commonly known as: NICODERM CQ - dosed in mg/24 hours 14 mg patch daily to chest wall, okay to substitute generic   rosuvastatin 20 MG tablet Commonly known as: Crestor Take 1 tablet (20 mg total) by mouth at bedtime.   sodium bicarbonate 325 MG tablet Take 325 mg by mouth 2 (two) times daily.   sodium chloride 0.65 % Soln nasal spray Commonly known as: OCEAN Place 1 spray into both nostrils as needed for congestion.        DISCHARGE  INSTRUCTIONS:   Follow-up PMD 5 days Follow-up nephrology 1 to 2 weeks  If you experience worsening of your admission symptoms, develop shortness of breath, life threatening emergency, suicidal or homicidal thoughts you must seek medical attention immediately by calling 911 or calling your MD immediately  if symptoms less severe.  You Must read complete instructions/literature along with all the possible adverse reactions/side effects for all the Medicines you take and that have been prescribed to you. Take any new Medicines after you have completely understood and accept all the possible adverse reactions/side effects.   Please note  You were cared for by a hospitalist during your hospital stay. If you have any questions about your discharge medications or the care you received while you were in the hospital after you are discharged, you can call the unit and asked to speak with the hospitalist on call if the hospitalist that took care of you is not available. Once you are discharged, your primary care physician will handle any further medical issues. Please note that NO REFILLS for any discharge medications will be authorized once you are discharged, as it is imperative that  you return to your primary care physician (or establish a relationship with a primary care physician if you do not have one) for your aftercare needs so that they can reassess your need for medications and monitor your lab values.    Today   CHIEF COMPLAINT:   Chief Complaint  Patient presents with  . Shortness of Breath    HISTORY OF PRESENT ILLNESS:  [name]  is a 74 y.o. male admitted with shortness of breath   VITAL SIGNS:  Blood pressure (!) 155/69, pulse (!) 57, temperature (!) 97.4 F (36.3 C), temperature source Oral, resp. rate 16, height 5\' 6"  (1.676 m), weight 66.5 kg, SpO2 98 %.    PHYSICAL EXAMINATION:  GENERAL:  74 y.o.-year-old patient lying in the bed with no acute distress.  EYES: Pupils equal, round, reactive to light and accommodation. No scleral icterus. HEENT: Head atraumatic, normocephalic. Oropharynx and nasopharynx clear.  LUNGS: Normal breath sounds bilaterally, no wheezing, rales,rhonchi or crepitation. No use of accessory muscles of respiration.  CARDIOVASCULAR: S1, S2 normal. No murmurs, rubs, or gallops.  ABDOMEN: Soft, non-tender, non-distended. EXTREMITIES: No pedal edema, cyanosis, or clubbing.  NEUROLOGIC: Cranial nerves II through XII are intact. Muscle strength 5/5 in all extremities. Sensation intact. Gait not checked.  PSYCHIATRIC: The patient is alert and oriented x 3.  SKIN: No obvious rash, lesion, or ulcer.   DATA REVIEW:   CBC Recent Labs  Lab 05/14/20 0338  WBC 9.0  HGB 8.6*  HCT 24.9*  PLT 147*    Chemistries  Recent Labs  Lab 05/14/20 0338  NA 144  K 3.9  CL 109  CO2 25  GLUCOSE 109*  BUN 49*  CREATININE 7.28*  CALCIUM 8.2*  MG 2.1     Microbiology Results  Results for orders placed or performed during the hospital encounter of 05/11/20  Resp Panel by RT-PCR (Flu A&B, Covid) Nasopharyngeal Swab     Status: None   Collection Time: 05/11/20 11:39 AM   Specimen: Nasopharyngeal Swab; Nasopharyngeal(NP)  swabs in vial transport medium  Result Value Ref Range Status   SARS Coronavirus 2 by RT PCR NEGATIVE NEGATIVE Final    Comment: (NOTE) SARS-CoV-2 target nucleic acids are NOT DETECTED.  The SARS-CoV-2 RNA is generally detectable in upper respiratory specimens during the acute phase of infection. The lowest concentration of SARS-CoV-2  viral copies this assay can detect is 138 copies/mL. A negative result does not preclude SARS-Cov-2 infection and should not be used as the sole basis for treatment or other patient management decisions. A negative result may occur with  improper specimen collection/handling, submission of specimen other than nasopharyngeal swab, presence of viral mutation(s) within the areas targeted by this assay, and inadequate number of viral copies(<138 copies/mL). A negative result must be combined with clinical observations, patient history, and epidemiological information. The expected result is Negative.  Fact Sheet for Patients:  EntrepreneurPulse.com.au  Fact Sheet for Healthcare Providers:  IncredibleEmployment.be  This test is no t yet approved or cleared by the [place] FDA and  has been authorized for detection and/or diagnosis of SARS-CoV-2 by FDA under an Emergency Use Authorization (EUA). This EUA will remain  in effect (meaning this test can be used) for the duration of the COVID-19 declaration under Section 564(b)(1) of the Act, 21 U.S.C.section 360bbb-3(b)(1), unless the authorization is terminated  or revoked sooner.       Influenza A by PCR NEGATIVE NEGATIVE Final   Influenza B by PCR NEGATIVE NEGATIVE Final    Comment: (NOTE) The Xpert Xpress SARS-CoV-2/FLU/RSV plus assay is intended as an aid in the diagnosis of influenza from Nasopharyngeal swab specimens and should not be used as a sole basis for treatment. Nasal washings and aspirates are unacceptable for Xpert Xpress  SARS-CoV-2/FLU/RSV testing.  Fact Sheet for Patients: EntrepreneurPulse.com.au  Fact Sheet for Healthcare Providers: IncredibleEmployment.be  This test is not yet approved or cleared by the [place] FDA and has been authorized for detection and/or diagnosis of SARS-CoV-2 by FDA under an Emergency Use Authorization (EUA). This EUA will remain in effect (meaning this test can be used) for the duration of the COVID-19 declaration under Section 564(b)(1) of the Act, 21 U.S.C. section 360bbb-3(b)(1), unless the authorization is terminated or revoked.  Performed at [facility], [address], [place], [place] [address]       Management plans discussed with the patient and he is in agreement.  Tried to speak with the patient's wife but left a message.  CODE STATUS:     Code Status Orders  (From admission, onward)         Start     Ordered   05/12/20 0919  Full code  Continuous        05/12/20 0918        Code Status History    Date Active Date Inactive Code Status Order ID Comments User Context   01/14/2020 1919 01/15/2020 2022 Full Code [id number]  Clarnce Flock, MD ED   Advance Care Planning Activity    Advance Directive Documentation   Flowsheet Row Most Recent Value  Type of Advance Directive Healthcare Power of [place]  Pre-existing out of facility DNR order (yellow form or pink MOST form) --  "MOST" Form in Place? --      TOTAL TIME TAKING CARE OF THIS PATIENT: 35 minutes.    Loletha Grayer M.D on 05/14/2020 at 4:22 PM  Between 7am to 6pm - Pager - [phone]  After 6pm go to [email or web address] - password [email or web address]  [facility]  CC: Primary care physician; Crecencio Mc, MD

## 2020-05-14 NOTE — Telephone Encounter (Signed)
[facility] called and scheduled a HFU for pt he is being discharged today  I scheduled pt for 05/29/19 they said that he needed to see Dr. Derrel Nip in the next 5 days

## 2020-05-14 NOTE — Progress Notes (Signed)
Progress Note  Patient Name: [name] Date of Encounter: 05/14/2020  [facility] Cardiologist: [name], MD   Subjective   Feels much better compared to admission.  Blood pressure slightly improved.  Has a history of headaches with Imdur.  He experiences dizziness with "normal" blood pressures around 177 systolic.  Inpatient Medications    Scheduled Meds: . amLODipine  10 mg Oral Daily  . aspirin EC  81 mg Oral Daily  . calcitRIOL  0.25 mcg Oral Q M,W,F  . carvedilol  25 mg Oral BID WC  . cloNIDine  0.2 mg Oral TID  . clopidogrel  75 mg Oral Daily  . doxazosin  8 mg Oral Daily  . ezetimibe  10 mg Oral Daily  . fluticasone  2 spray Each Nare Daily  . gentamicin cream  1 application Topical Daily  . heparin  5,000 Units Subcutaneous Q8H  . hydrALAZINE  25 mg Oral TID  . [START ON 05/15/2020] isosorbide mononitrate  30 mg Oral Daily  . multivitamin  1 tablet Oral QHS  . nicotine  21 mg Transdermal Daily  . rosuvastatin  20 mg Oral QPM  . sodium bicarbonate  325 mg Oral BID   Continuous Infusions: . dialysis solution 2.5% low-MG/low-CA Stopped (05/14/20 0300)  . dialysis solution 2.5% low-MG/low-CA Stopped (05/14/20 0300)   PRN Meds: acetaminophen, dextromethorphan-guaiFENesin, diphenhydrAMINE, heparin, hydrALAZINE, levalbuterol, sodium chloride   Vital Signs    Vitals:   05/13/20 2034 05/14/20 0601 05/14/20 0910 05/14/20 1147  BP: (!) 182/74 (!) 186/78 (!) 195/74 (!) 155/69  Pulse: 60 68 61 (!) 57  Resp: 17 18 16 16   Temp: 98.2 F (36.8 C) 98.4 F (36.9 C) 98.3 F (36.8 C) (!) 97.4 F (36.3 C)  TempSrc:    Oral  SpO2: 97% 97% 96% 98%  Weight:      Height:        Intake/Output Summary (Last 24 hours) at 05/14/2020 1231 Last data filed at 05/13/2020 1855 Gross per 24 hour  Intake 240 ml  Output --  Net 240 ml   Last 3 Weights 05/12/2020 05/11/2020 04/02/2020  Weight (lbs) 146 lb 11.2 oz 142 lb 148 lb  Weight (kg) 66.543 kg 64.411 kg  67.132 kg      Telemetry    Currently off telemetry- Personally Reviewed  ECG    No new ECG- Personally Reviewed  Physical Exam   GEN: No acute distress.   Neck: No JVD Cardiac: RRR, faint systolic murmur Respiratory:  Decreased breath sounds at bases GI: Soft, nontender, non-distended, peritoneal dialysis port noted MS: No edema; No deformity. Neuro:  Nonfocal  Psych: Normal affect   Labs    High Sensitivity Troponin:   Recent Labs  Lab 05/11/20 1352 05/11/20 2237 05/12/20 1238 05/12/20 2016 05/13/20 0615  TROPONINIHS 105* 177* 247* 319* 310*      Chemistry Recent Labs  Lab 05/12/20 1807 05/13/20 0615 05/14/20 0338  NA 140 143 144  K 4.1 3.5 3.9  CL 108 109 109  CO2 21* 20* 25  GLUCOSE 154* 102* 109*  BUN 58* 53* 49*  CREATININE 7.41* 7.34* 7.28*  CALCIUM 8.5* 8.6* 8.2*  GFRNONAA 7* 7* 7*  ANIONGAP 11 14 10      Hematology Recent Labs  Lab 05/12/20 1807 05/13/20 0615 05/14/20 0338  WBC 8.9 9.8 9.0  RBC 2.81* 2.91* 2.75*  HGB 8.7* 9.2* 8.6*  HCT 25.4* 25.7* 24.9*  MCV 90.4 88.3 90.5  MCH 31.0 31.6 31.3  MCHC 34.3 35.8 34.5  RDW 13.9 13.8 13.7  PLT 143* 158 147*    BNP Recent Labs  Lab 05/11/20 1042  BNP 3,057.7*     DDimer No results for input(s): DDIMER in the last 168 hours.   Radiology    No results found.  Cardiac Studies   Echo 05/12/20 1. Left ventricular ejection fraction, by estimation, is 40 to 45%. The  left ventricle has mildly decreased function. The left ventricle has no  regional wall motion abnormalities. The left ventricular internal cavity  size was mildly dilated. There is  moderate left ventricular hypertrophy. Left ventricular diastolic  parameters are consistent with Grade I diastolic dysfunction (impaired  relaxation). The average left ventricular global longitudinal strain is  -13.7 %. The global longitudinal strain is  abnormal.  2. Right ventricular systolic function is normal. The right  ventricular  size is normal. Tricuspid regurgitation signal is inadequate for assessing  PA pressure.  3. Left atrial size was moderately dilated.  4. The mitral valve is normal in structure. Mild mitral valve  regurgitation. No evidence of mitral stenosis.  5. The aortic valve is normal in structure. Aortic valve regurgitation is  mild. Mild to moderate aortic valve sclerosis/calcification is present,  without any evidence of aortic stenosis.  6. Aortic dilatation noted. There is mild dilatation of the aortic root,  measuring 39 mm.  7. The inferior vena cava is dilated in size with >50% respiratory  variability, suggesting right atrial pressure of 8 mmHg.   Patient Profile     74 y.o. male history of HFrEF, EF 40 to 45%, PAD, hypertension, end-stage renal disease on peritoneal dialysis, current smoker presenting with shortness of breath and uncontrolled hypertension  Assessment & Plan    1.  Uncontrolled hypertension -BP improved but still elevated -Agree with starting hydralazine 25 mg 3 times daily -Continue Norvasc, Coreg, clonidine, Imdur. -Goal BP around 140s to 150s should be sufficient  2.  HFrEF, EF 40 to 45% -Coreg -Avoiding ACE ARB due to renal dysfunction.  Patient still makes some urine. -Hydralazine, Imdur as above. -Outpatient ischemic work-up can be considered by primary cardiologist.  3.  End-stage renal disease on PD -Nephrology following -Appreciate input   Total encounter time 35 minutes  Greater than 50% was spent in counseling and coordination of care with the patient     Signed, Kate Sable, MD  05/14/2020, 12:31 PM

## 2020-05-15 ENCOUNTER — Telehealth: Payer: Self-pay

## 2020-05-15 DIAGNOSIS — D631 Anemia in chronic kidney disease: Secondary | ICD-10-CM | POA: Diagnosis not present

## 2020-05-15 DIAGNOSIS — D509 Iron deficiency anemia, unspecified: Secondary | ICD-10-CM | POA: Diagnosis not present

## 2020-05-15 DIAGNOSIS — N186 End stage renal disease: Secondary | ICD-10-CM | POA: Diagnosis not present

## 2020-05-15 DIAGNOSIS — Z992 Dependence on renal dialysis: Secondary | ICD-10-CM | POA: Diagnosis not present

## 2020-05-15 NOTE — Telephone Encounter (Signed)
LMTCB

## 2020-05-15 NOTE — Telephone Encounter (Signed)
Transition Care Management Follow-up Telephone Call  Date of discharge and from where: 05/14/20 from [facility].  How have you been since you were released from the hospital? Patient states,"I am adjusting to taking the new medication. I feel a little dizzy when I take the BP medications but I was told my body would adjust to it and today is better than yesterday. Minimal cough with phlegm in the morning from nasal drainage. My breathing is good and my heart is not jumping." Spot checking BP; taken during this call as 160/62, P59. Denies shortness of breath, N/V/D, headache, blurred vision, falls. No BM since returning home; plans to take stool softner today.  Eating/drinking without issue.    Any questions or concerns? No  Items Reviewed:  Did the pt receive and understand the discharge instructions provided? Yes   Medications obtained and verified? Yes   Other? No   Any new allergies since your discharge? No   Dietary orders reviewed? Yes  Do you have support at home? Yes   Home Care and Equipment/Supplies: Were home health services ordered? No Were any new equipment or medical supplies ordered? No  Functional Questionnaire: (I = Independent and D = Dependent) ADLs: i  Bathing/Dressing- i  Meal Prep- i  Eating- i  Maintaining continence- i  Transferring/Ambulation- i  Managing Meds- i  Follow up appointments reviewed:   PCP Hospital f/u appt confirmed? Yes  Scheduled to see Dr. Derrel Nip on 05/20/20 @ 12:00.  [facility] f/u appt confirmed? Yes  Scheduled to see Nephrology on 05/21/20 @ 11:20.  Are transportation arrangements needed? No   If their condition worsens, is the pt aware to call PCP or go to the Emergency Dept.? Yes  Was the patient provided with contact information for the PCP's office or ED? Yes  Was to pt encouraged to call back with questions or concerns? Yes

## 2020-05-15 NOTE — Telephone Encounter (Signed)
Spoke with patient in completing TCM. Patient agrees to schedule appointment 05/20/20 @ 12:00.

## 2020-05-15 NOTE — Telephone Encounter (Signed)
Pt has been scheduled.  °

## 2020-05-16 DIAGNOSIS — N186 End stage renal disease: Secondary | ICD-10-CM | POA: Diagnosis not present

## 2020-05-16 DIAGNOSIS — D631 Anemia in chronic kidney disease: Secondary | ICD-10-CM | POA: Diagnosis not present

## 2020-05-16 DIAGNOSIS — D509 Iron deficiency anemia, unspecified: Secondary | ICD-10-CM | POA: Diagnosis not present

## 2020-05-16 DIAGNOSIS — Z992 Dependence on renal dialysis: Secondary | ICD-10-CM | POA: Diagnosis not present

## 2020-05-17 DIAGNOSIS — Z992 Dependence on renal dialysis: Secondary | ICD-10-CM | POA: Diagnosis not present

## 2020-05-17 DIAGNOSIS — D509 Iron deficiency anemia, unspecified: Secondary | ICD-10-CM | POA: Diagnosis not present

## 2020-05-17 DIAGNOSIS — D631 Anemia in chronic kidney disease: Secondary | ICD-10-CM | POA: Diagnosis not present

## 2020-05-17 DIAGNOSIS — N186 End stage renal disease: Secondary | ICD-10-CM | POA: Diagnosis not present

## 2020-05-18 DIAGNOSIS — Z992 Dependence on renal dialysis: Secondary | ICD-10-CM | POA: Diagnosis not present

## 2020-05-18 DIAGNOSIS — D631 Anemia in chronic kidney disease: Secondary | ICD-10-CM | POA: Diagnosis not present

## 2020-05-18 DIAGNOSIS — D509 Iron deficiency anemia, unspecified: Secondary | ICD-10-CM | POA: Diagnosis not present

## 2020-05-18 DIAGNOSIS — N186 End stage renal disease: Secondary | ICD-10-CM | POA: Diagnosis not present

## 2020-05-19 DIAGNOSIS — D631 Anemia in chronic kidney disease: Secondary | ICD-10-CM | POA: Diagnosis not present

## 2020-05-19 DIAGNOSIS — D509 Iron deficiency anemia, unspecified: Secondary | ICD-10-CM | POA: Diagnosis not present

## 2020-05-19 DIAGNOSIS — N186 End stage renal disease: Secondary | ICD-10-CM | POA: Diagnosis not present

## 2020-05-19 DIAGNOSIS — Z992 Dependence on renal dialysis: Secondary | ICD-10-CM | POA: Diagnosis not present

## 2020-05-20 ENCOUNTER — Ambulatory Visit (INDEPENDENT_AMBULATORY_CARE_PROVIDER_SITE_OTHER): Payer: Medicare Other | Admitting: Internal Medicine

## 2020-05-20 ENCOUNTER — Other Ambulatory Visit: Payer: Self-pay

## 2020-05-20 ENCOUNTER — Encounter: Payer: Self-pay | Admitting: Internal Medicine

## 2020-05-20 DIAGNOSIS — G8929 Other chronic pain: Secondary | ICD-10-CM | POA: Diagnosis not present

## 2020-05-20 DIAGNOSIS — I480 Paroxysmal atrial fibrillation: Secondary | ICD-10-CM

## 2020-05-20 DIAGNOSIS — M5442 Lumbago with sciatica, left side: Secondary | ICD-10-CM | POA: Diagnosis not present

## 2020-05-20 DIAGNOSIS — D631 Anemia in chronic kidney disease: Secondary | ICD-10-CM

## 2020-05-20 DIAGNOSIS — N184 Chronic kidney disease, stage 4 (severe): Secondary | ICD-10-CM | POA: Diagnosis not present

## 2020-05-20 DIAGNOSIS — N186 End stage renal disease: Secondary | ICD-10-CM | POA: Diagnosis not present

## 2020-05-20 DIAGNOSIS — Z716 Tobacco abuse counseling: Secondary | ICD-10-CM | POA: Diagnosis not present

## 2020-05-20 DIAGNOSIS — Z992 Dependence on renal dialysis: Secondary | ICD-10-CM | POA: Diagnosis not present

## 2020-05-20 DIAGNOSIS — D509 Iron deficiency anemia, unspecified: Secondary | ICD-10-CM | POA: Diagnosis not present

## 2020-05-20 DIAGNOSIS — Z09 Encounter for follow-up examination after completed treatment for conditions other than malignant neoplasm: Secondary | ICD-10-CM

## 2020-05-20 DIAGNOSIS — I10 Essential (primary) hypertension: Secondary | ICD-10-CM | POA: Diagnosis not present

## 2020-05-20 DIAGNOSIS — I5023 Acute on chronic systolic (congestive) heart failure: Secondary | ICD-10-CM

## 2020-05-20 DIAGNOSIS — M5441 Lumbago with sciatica, right side: Secondary | ICD-10-CM

## 2020-05-20 NOTE — Patient Instructions (Addendum)
Talk to your kidney doctor about the dialysis problems  We need to keep your blood pressure under 802 systolic:   I want you try resuming the hydralazine and start taking 1000 mg tylenol every 12 hours ( 2 times daily) to manage the headache.  If that doesn't relieve the pain ,  LET ME KNOW and I will prescribe something stronger    Try getting a wedge for the bed to elevate your torso so your lungs don't fill up with fluid  For the nasal congestion : use the Afrin!!    Flonase twice daily,   followed by Afrin just at night

## 2020-05-20 NOTE — Progress Notes (Signed)
Subjective:  Patient ID: [name], male    DOB: 1946/03/31  Age: 74 y.o. MRN: [id number]  CC: Diagnoses of Accelerated hypertension, Acute on chronic systolic congestive heart failure (HCC), AF (paroxysmal atrial fibrillation) ([place]), Anemia of chronic kidney failure, stage 4 (severe) (HCC), Chronic bilateral low back pain with bilateral sciatica, Tobacco abuse counseling, and Hospital discharge follow-up were pertinent to this visit.  HPI [name] presents for hospital follow up.  Accompanied by his wife.   This visit occurred during the SARS-CoV-2 public health emergency.  Safety protocols were in place, including screening questions prior to the visit, additional usage of staff PPE, and extensive cleaning of exam room while observing appropriate contact time as indicated for disinfecting solutions.    Patient has received one dose of the Moderna COVID 19 vaccine that was complicated by 3-4 days of extreme flu like symptoms,  So he has opted out of any additional doses.  He also continues to decline the annual flu vaccine, against medical advice .    Patient was admitted to [facility] on Dec 19 th with hypertensive urgency and associated symptoms of chest pressure, dyspnea, and irregular heart beat. Had not been feeling good for several days,  But became acutely unsteady on feet, short of breath while out getting coffee.  Drove home and wife noted extreme pallor .(see below) and called 911 . Wife reports that he had reduced his peritoneal dialysis from four times daily to once daily  For the preceeding 5 to 6 weeks and had not notified his nephrologist.  He did this because he was not tolerating the increased abdominal pressure that was making him orthopneic.   1.  Hypertensive urgency on presentation.  Blood pressure very variable during the hospital course. Recorded as  109 systolic upon ER arrival.   .  Blood pressure upon disposition  To floor ws 155/69.  Hydralazine 25 mg 3 times daily  added to his usual regimen. He was advised to continue amlodipine Coreg clonidine and Imdur. 2.  Acute on chronic systolic congestive heart failure.  The patient  not been compliant with schedule for 5-6 weeks prior to presentation. Options for management limited   Patient on Coreg.  Has allergy listed to ARB with hyperkalemia.  Spironolactone not a good medication and dialysis patients. 3.  End-stage renal disease on peritoneal dialysis 4.  Brief paroxysmal atrial fibrillation.  noted on arrival to ER.  He was seen by cardiology and there was no evidence on EMS report.  Holding off on anticoagulation. 5.  Anemia of chronic disease: no transfusion was indicated.  6.  History of stroke : he was continued on  Aspirin,  Plavix Crestor and Zetia 7.  Type 2 diabetes mellitus with hyperlipidemia:  diet controlled.  Hemoglobin A1c actually low at 5.9  He was discharged to home on Dec 22 with the following medication changes:  Gabapentin was  stopped .  Hydralazine was  added 25 mg tid, and Imdur was also started (per patient). However, Patient has not tolerated imdur due to recurrent severe headache. He has also stopped the hydralazine due to headache. Has not had either in 24 hours and today's home BP prior to meds was 205/78.  Following administration of his other medications .  168/64  Since discharge he has Not been sleeping well .  Several factors contributing  Wakes up at 2-3 am and can't go back to sleep.  His home peritoneal dialysis machine  Creates pressure on  abdomen that increases steadily so that by the second cycle, the pressure  is so intense  he has to sit up to breathe,  It also beeps frequently during the night.  He  Also suffers from nasal congestion chronic. .  Started taking tylenol PM 2 nights ago.  He has a good urine output;  Has nocturia x 3 , moderate volume.  Also voids frequently during the day   He continues to smoke.  Discussed the limitations of modern medicine given his  advanced conditions  Pursed lip breathing demonstrated.  Wedge for bed recommended.  Need to communicate dialysis issues AS THEY OCCUR with nephrology stressed.  He has  follow up with nephrology tomorrow.     Outpatient Medications Prior to Visit  Medication Sig Dispense Refill  . albuterol (VENTOLIN HFA) 108 (90 Base) MCG/ACT inhaler Inhale 2 puffs into the lungs every 6 (six) hours as needed for wheezing or shortness of breath. 18 g 0  . amLODipine (NORVASC) 10 MG tablet TAKE 1 TABLET BY MOUTH ONCE DAILY (Patient taking differently: Take 10 mg by mouth daily.) 90 tablet 1  . aspirin 81 MG tablet Take 1 tablet (81 mg total) by mouth daily. 30 tablet 11  . calcitRIOL (ROCALTROL) 0.25 MCG capsule Take 0.25 mcg by mouth every Monday, Wednesday, and Friday.  3  . carvedilol (COREG) 25 MG tablet Take 25 mg by mouth 2 (two) times daily with a meal.  6  . cloNIDine (CATAPRES) 0.2 MG tablet Take 0.2 mg by mouth 3 (three) times daily.    . clopidogrel (PLAVIX) 75 MG tablet Take 1 tablet (75 mg total) by mouth daily. 90 tablet 1  . diphenhydrAMINE (BENADRYL) 25 MG tablet Take 25 mg by mouth every 6 (six) hours as needed for allergies.    Marland Kitchen doxazosin (CARDURA) 8 MG tablet Take 8 mg by mouth daily.    Marland Kitchen ezetimibe (ZETIA) 10 MG tablet Take 1 tablet (10 mg total) by mouth daily. 90 tablet 3  . fluticasone (FLONASE) 50 MCG/ACT nasal spray Place 2 sprays into both nostrils daily. 16 g 6  . gentamicin cream (GARAMYCIN) 0.1 % Apply 1 application topically daily. 15 g 0  . hydrALAZINE (APRESOLINE) 25 MG tablet Take 1 tablet (25 mg total) by mouth 3 (three) times daily. 90 tablet 0  . isosorbide mononitrate (IMDUR) 30 MG 24 hr tablet Take 1 tablet (30 mg total) by mouth daily. 90 tablet 1  . multivitamin (RENA-VIT) TABS tablet Take 1 tablet by mouth at bedtime.    . nicotine (NICODERM CQ - DOSED IN MG/24 HOURS) 14 mg/24hr patch 14 mg patch daily to chest wall, okay to substitute generic 28 patch 0  .  rosuvastatin (CRESTOR) 20 MG tablet Take 1 tablet (20 mg total) by mouth at bedtime. 90 tablet 1  . sodium bicarbonate 325 MG tablet Take 325 mg by mouth 2 (two) times daily.  1  . sodium chloride (OCEAN) 0.65 % SOLN nasal spray Place 1 spray into both nostrils as needed for congestion. 15 mL 0   Facility-Administered Medications Prior to Visit  Medication Dose Route Frequency Provider Last Rate Last Admin  . ipratropium-albuterol (DUONEB) 0.5-2.5 (3) MG/3ML nebulizer solution 3 mL  3 mL Nebulization Q6H Crecencio Mc, MD        Review of Systems;  Patient denies headache, fevers, malaise, unintentional weight loss, skin rash, eye pain, sinus congestion and sinus pain, sore throat, dysphagia,  hemoptysis , cough, dyspnea, wheezing, chest pain,  palpitations, orthopnea, edema, abdominal pain, nausea, melena, diarrhea, constipation, flank pain, dysuria, hematuria, urinary  Frequency, nocturia, numbness, tingling, seizures,  Focal weakness, Loss of consciousness,  Tremor, insomnia, depression, anxiety, and suicidal ideation.      Objective:  BP (!) 160/58   Pulse 62   Temp 98.7 F (37.1 C)   Wt 148 lb 6.4 oz (67.3 kg)   SpO2 98%   BMI 23.95 kg/m   BP Readings from Last 3 Encounters:  05/20/20 (!) 160/58  05/14/20 (!) 155/69  04/02/20 (!) 160/90    Wt Readings from Last 3 Encounters:  05/20/20 148 lb 6.4 oz (67.3 kg)  05/12/20 146 lb 11.2 oz (66.5 kg)  04/02/20 148 lb (67.1 kg)    General appearance: alert, cooperative and appears stated age Ears: normal TM's and external ear canals both ears Throat: lips, mucosa, and tongue normal; teeth and gums normal Neck: no adenopathy, no carotid bruit, supple, symmetrical, trachea midline and thyroid not enlarged, symmetric, no tenderness/mass/nodules Back: symmetric, no curvature. ROM normal. No CVA tenderness. Lungs: clear to auscultation bilaterally Heart: regular rate and rhythm, S1, S2 normal, no murmur, click, rub or  gallop Abdomen: soft, non-tender; bowel sounds normal; no masses,  no organomegaly Pulses: 2+ and symmetric Skin: Skin color, texture, turgor normal. No rashes or lesions Lymph nodes: Cervical, supraclavicular, and axillary nodes normal.  Lab Results  Component Value Date   HGBA1C 5.9 (H) 05/12/2020   HGBA1C 6.2 (H) 01/14/2020   HGBA1C 5.7 (A) 11/30/2018   HGBA1C 5.7 11/30/2018    Lab Results  Component Value Date   CREATININE 7.28 (H) 05/14/2020   CREATININE 7.34 (H) 05/13/2020   CREATININE 7.41 (H) 05/12/2020    Lab Results  Component Value Date   WBC 9.0 05/14/2020   HGB 8.6 (L) 05/14/2020   HCT 24.9 (L) 05/14/2020   PLT 147 (L) 05/14/2020   GLUCOSE 109 (H) 05/14/2020   CHOL 122 05/12/2020   TRIG 180 (H) 05/12/2020   HDL 25 (L) 05/12/2020   LDLDIRECT 134.0 05/11/2017   LDLCALC 61 05/12/2020   ALT 10 01/14/2020   AST 9 (L) 01/14/2020   NA 144 05/14/2020   K 3.9 05/14/2020   CL 109 05/14/2020   CREATININE 7.28 (H) 05/14/2020   BUN 49 (H) 05/14/2020   CO2 25 05/14/2020   TSH 1.555 05/11/2020   PSA 9.98 (H) 01/21/2020   INR 1.0 01/14/2020   HGBA1C 5.9 (H) 05/12/2020   MICROALBUR 78.6 (H) 05/11/2017    ECHOCARDIOGRAM COMPLETE  Result Date: 05/12/2020    ECHOCARDIOGRAM REPORT   Patient Name:   AVERY KLINGBEIL Date of Exam: 05/12/2020 Medical Rec #:  [id number]       Height:       66.0 in Accession #:    [id number]      Weight:       142.0 lb Date of Birth:  01/03/1946       BSA:          1.729 m Patient Age:    12 years        BP:           150/65 mmHg Patient Gender: M               HR:           68 bpm. Exam Location:  [facility] Procedure: 2D Echo, Color Doppler, Cardiac Doppler and Strain Analysis Indications:     Elevated troponin  History:  Patient has prior history of Echocardiogram examinations, most                  recent 01/14/2020. TIA and PAD; Risk Factors:Current Smoker,                  Hypertension, Dyslipidemia and Diabetes.  Sonographer:     Charmayne Sheer  RDCS (AE) Referring Phys:  [id number] Arvil Chaco Diagnosing Phys: Kathlyn Sacramento MD  Sonographer Comments: Global longitudinal strain was attempted. IMPRESSIONS  1. Left ventricular ejection fraction, by estimation, is 40 to 45%. The left ventricle has mildly decreased function. The left ventricle has no regional wall motion abnormalities. The left ventricular internal cavity size was mildly dilated. There is moderate left ventricular hypertrophy. Left ventricular diastolic parameters are consistent with Grade I diastolic dysfunction (impaired relaxation). The average left ventricular global longitudinal strain is -13.7 %. The global longitudinal strain is abnormal.  2. Right ventricular systolic function is normal. The right ventricular size is normal. Tricuspid regurgitation signal is inadequate for assessing PA pressure.  3. Left atrial size was moderately dilated.  4. The mitral valve is normal in structure. Mild mitral valve regurgitation. No evidence of mitral stenosis.  5. The aortic valve is normal in structure. Aortic valve regurgitation is mild. Mild to moderate aortic valve sclerosis/calcification is present, without any evidence of aortic stenosis.  6. Aortic dilatation noted. There is mild dilatation of the aortic root, measuring 39 mm.  7. The inferior vena cava is dilated in size with >50% respiratory variability, suggesting right atrial pressure of 8 mmHg. FINDINGS  Left Ventricle: Left ventricular ejection fraction, by estimation, is 40 to 45%. The left ventricle has mildly decreased function. The left ventricle has no regional wall motion abnormalities. The average left ventricular global longitudinal strain is -13.7 %. The global longitudinal strain is abnormal. The left ventricular internal cavity size was mildly dilated. There is moderate left ventricular hypertrophy. Left ventricular diastolic parameters are consistent with Grade I diastolic dysfunction (impaired relaxation). Right  Ventricle: The right ventricular size is normal. No increase in right ventricular wall thickness. Right ventricular systolic function is normal. Tricuspid regurgitation signal is inadequate for assessing PA pressure. Left Atrium: Left atrial size was moderately dilated. Right Atrium: Right atrial size was normal in size. Pericardium: There is no evidence of pericardial effusion. Mitral Valve: The mitral valve is normal in structure. Mild mitral valve regurgitation. No evidence of mitral valve stenosis. MV peak gradient, 6.5 mmHg. The mean mitral valve gradient is 2.0 mmHg. Tricuspid Valve: The tricuspid valve is normal in structure. Tricuspid valve regurgitation is not demonstrated. No evidence of tricuspid stenosis. Aortic Valve: The aortic valve is normal in structure. Aortic valve regurgitation is mild. Aortic regurgitation PHT measures 573 msec. Mild to moderate aortic valve sclerosis/calcification is present, without any evidence of aortic stenosis. Aortic valve  mean gradient measures 5.0 mmHg. Aortic valve peak gradient measures 9.6 mmHg. Aortic valve area, by VTI measures 4.25 cm. Pulmonic Valve: The pulmonic valve was normal in structure. Pulmonic valve regurgitation is trivial. No evidence of pulmonic stenosis. Aorta: Aortic dilatation noted. There is mild dilatation of the aortic root, measuring 39 mm. Venous: The inferior vena cava is dilated in size with greater than 50% respiratory variability, suggesting right atrial pressure of 8 mmHg. IAS/Shunts: No atrial level shunt detected by color flow Doppler.  LEFT VENTRICLE PLAX 2D LVIDd:         5.79 cm  Diastology LVIDs:  4.54 cm  LV e' medial:    3.59 cm/s LV PW:         1.38 cm  LV E/e' medial:  20.3 LV IVS:        0.87 cm  LV e' lateral:   7.07 cm/s LVOT diam:     2.70 cm  LV E/e' lateral: 10.3 LV SV:         129 LV SV Index:   75       2D Longitudinal Strain LVOT Area:     5.73 cm 2D Strain GLS Avg:     -13.7 %  RIGHT VENTRICLE RV Basal diam:   3.24 cm LEFT ATRIUM             Index       RIGHT ATRIUM           Index LA diam:        3.90 cm 2.26 cm/m  RA Area:     15.80 cm LA Vol (A2C):   56.9 ml 32.91 ml/m RA Volume:   37.50 ml  21.69 ml/m LA Vol (A4C):   80.9 ml 46.79 ml/m LA Biplane Vol: 69.2 ml 40.03 ml/m  AORTIC VALVE                    PULMONIC VALVE AV Area (Vmax):    3.77 cm     PV Vmax:       0.93 m/s AV Area (Vmean):   3.90 cm     PV Vmean:      62.500 cm/s AV Area (VTI):     4.25 cm     PV VTI:        0.211 m AV Vmax:           155.00 cm/s  PV Peak grad:  3.4 mmHg AV Vmean:          101.000 cm/s PV Mean grad:  2.0 mmHg AV VTI:            0.303 m AV Peak Grad:      9.6 mmHg AV Mean Grad:      5.0 mmHg LVOT Vmax:         102.00 cm/s LVOT Vmean:        68.800 cm/s LVOT VTI:          0.225 m LVOT/AV VTI ratio: 0.74 AI PHT:            573 msec  AORTA Ao Root diam: 3.90 cm MITRAL VALVE MV Area (PHT): 3.39 cm     SHUNTS MV Peak grad:  6.5 mmHg     Systemic VTI:  0.22 m MV Mean grad:  2.0 mmHg     Systemic Diam: 2.70 cm MV Vmax:       1.27 m/s MV Vmean:      66.5 cm/s MV Decel Time: 224 msec MV E velocity: 72.80 cm/s MV A velocity: 112.00 cm/s MV E/A ratio:  0.65 Kathlyn Sacramento MD Electronically signed by Kathlyn Sacramento MD Signature Date/Time: 05/12/2020/12:34:03 PM    Final     Assessment & Plan:   Problem List Items Addressed This Visit      Unprioritized   Accelerated hypertension    Uncontrolled due to medication intolerance.  He was Advised to resume hydralazine before the Imdur and schedule 1000 mg bid of tylenol  For management of headache if it occurs.       Acute on chronic systolic congestive heart failure (  [place])    Secondary to uncontrolled hypertension and volume overload.  Lungs are clear today      AF (paroxysmal atrial fibrillation) (HCC)    Brief,  Likely due to transient hypoxia. No recurrence during hospitalization       Anemia of chronic kidney failure, stage 4 (severe) ([place])    Advised strongly to discuss  his schedule of PD and the associated symptoms that are making it difficult to comply with his nephrologist.      Chronic bilateral low back pain with bilateral sciatica    His gabapentin was stopped ,, presumably due to effects on fluid retention .  Will mange his pain without gabapentin.  Starting tylenol 1000 mg bid       Hospital discharge follow-up    Patient is stable post discharge and has no new issues or questions about discharge plans at the visit today for hospital follow up. All labs , imaging studies and progress notes from admission were reviewed with patient today  And all medications were reconciled      Tobacco abuse counseling    He remains a lifelong smoker and has no intention of quitting          I am having [name] maintain his aspirin, diphenhydrAMINE, carvedilol, sodium bicarbonate, calcitRIOL, fluticasone, albuterol, ezetimibe, amLODipine, multivitamin, nicotine, cloNIDine, doxazosin, clopidogrel, rosuvastatin, isosorbide mononitrate, gentamicin cream, hydrALAZINE, and sodium chloride. We will continue to administer ipratropium-albuterol.  No orders of the defined types were placed in this encounter.   There are no discontinued medications.  Follow-up: Return in about 3 months (around 08/18/2020) for follow up diabetes.   Crecencio Mc, MD

## 2020-05-21 DIAGNOSIS — Z992 Dependence on renal dialysis: Secondary | ICD-10-CM | POA: Diagnosis not present

## 2020-05-21 DIAGNOSIS — N186 End stage renal disease: Secondary | ICD-10-CM | POA: Diagnosis not present

## 2020-05-21 DIAGNOSIS — D631 Anemia in chronic kidney disease: Secondary | ICD-10-CM | POA: Diagnosis not present

## 2020-05-21 DIAGNOSIS — D509 Iron deficiency anemia, unspecified: Secondary | ICD-10-CM | POA: Diagnosis not present

## 2020-05-21 NOTE — Assessment & Plan Note (Signed)
Advised strongly to discuss his schedule of PD and the associated symptoms that are making it difficult to comply with his nephrologist.

## 2020-05-21 NOTE — Assessment & Plan Note (Signed)
Patient is stable post discharge and has no new issues or questions about discharge plans at the visit today for hospital follow up. All labs , imaging studies and progress notes from admission were reviewed with patient today  And all medications were reconciled

## 2020-05-21 NOTE — Assessment & Plan Note (Signed)
Brief,  Likely due to transient hypoxia. No recurrence during hospitalization

## 2020-05-21 NOTE — Assessment & Plan Note (Signed)
He remains a lifelong smoker and has no intention of quitting

## 2020-05-21 NOTE — Assessment & Plan Note (Signed)
Secondary to uncontrolled hypertension and volume overload.  Lungs are clear today

## 2020-05-21 NOTE — Assessment & Plan Note (Signed)
Uncontrolled due to medication intolerance.  He was Advised to resume hydralazine before the Imdur and schedule 1000 mg bid of tylenol  For management of headache if it occurs.

## 2020-05-21 NOTE — Assessment & Plan Note (Signed)
His gabapentin was stopped ,, presumably due to effects on fluid retention .  Will mange his pain without gabapentin.  Starting tylenol 1000 mg bid

## 2020-05-22 DIAGNOSIS — D631 Anemia in chronic kidney disease: Secondary | ICD-10-CM | POA: Diagnosis not present

## 2020-05-22 DIAGNOSIS — D509 Iron deficiency anemia, unspecified: Secondary | ICD-10-CM | POA: Diagnosis not present

## 2020-05-22 DIAGNOSIS — N186 End stage renal disease: Secondary | ICD-10-CM | POA: Diagnosis not present

## 2020-05-22 DIAGNOSIS — Z992 Dependence on renal dialysis: Secondary | ICD-10-CM | POA: Diagnosis not present

## 2020-05-23 DIAGNOSIS — N186 End stage renal disease: Secondary | ICD-10-CM | POA: Diagnosis not present

## 2020-05-23 DIAGNOSIS — D631 Anemia in chronic kidney disease: Secondary | ICD-10-CM | POA: Diagnosis not present

## 2020-05-23 DIAGNOSIS — D509 Iron deficiency anemia, unspecified: Secondary | ICD-10-CM | POA: Diagnosis not present

## 2020-05-23 DIAGNOSIS — Z992 Dependence on renal dialysis: Secondary | ICD-10-CM | POA: Diagnosis not present

## 2020-05-28 ENCOUNTER — Inpatient Hospital Stay: Payer: Medicare Other | Admitting: Internal Medicine

## 2020-06-02 DIAGNOSIS — N186 End stage renal disease: Secondary | ICD-10-CM | POA: Diagnosis not present

## 2020-06-02 DIAGNOSIS — Z992 Dependence on renal dialysis: Secondary | ICD-10-CM | POA: Diagnosis not present

## 2020-06-02 DIAGNOSIS — I259 Chronic ischemic heart disease, unspecified: Secondary | ICD-10-CM | POA: Diagnosis not present

## 2020-06-02 DIAGNOSIS — E119 Type 2 diabetes mellitus without complications: Secondary | ICD-10-CM | POA: Diagnosis not present

## 2020-06-02 DIAGNOSIS — D509 Iron deficiency anemia, unspecified: Secondary | ICD-10-CM | POA: Diagnosis not present

## 2020-06-02 DIAGNOSIS — D631 Anemia in chronic kidney disease: Secondary | ICD-10-CM | POA: Diagnosis not present

## 2020-06-02 DIAGNOSIS — N2581 Secondary hyperparathyroidism of renal origin: Secondary | ICD-10-CM | POA: Diagnosis not present

## 2020-06-03 DIAGNOSIS — D631 Anemia in chronic kidney disease: Secondary | ICD-10-CM | POA: Diagnosis not present

## 2020-06-03 DIAGNOSIS — N186 End stage renal disease: Secondary | ICD-10-CM | POA: Diagnosis not present

## 2020-06-03 DIAGNOSIS — N2581 Secondary hyperparathyroidism of renal origin: Secondary | ICD-10-CM | POA: Diagnosis not present

## 2020-06-03 DIAGNOSIS — Z992 Dependence on renal dialysis: Secondary | ICD-10-CM | POA: Diagnosis not present

## 2020-06-03 DIAGNOSIS — D509 Iron deficiency anemia, unspecified: Secondary | ICD-10-CM | POA: Diagnosis not present

## 2020-06-04 DIAGNOSIS — N186 End stage renal disease: Secondary | ICD-10-CM | POA: Diagnosis not present

## 2020-06-04 DIAGNOSIS — N2581 Secondary hyperparathyroidism of renal origin: Secondary | ICD-10-CM | POA: Diagnosis not present

## 2020-06-04 DIAGNOSIS — Z992 Dependence on renal dialysis: Secondary | ICD-10-CM | POA: Diagnosis not present

## 2020-06-04 DIAGNOSIS — D509 Iron deficiency anemia, unspecified: Secondary | ICD-10-CM | POA: Diagnosis not present

## 2020-06-04 DIAGNOSIS — D631 Anemia in chronic kidney disease: Secondary | ICD-10-CM | POA: Diagnosis not present

## 2020-06-05 DIAGNOSIS — N2581 Secondary hyperparathyroidism of renal origin: Secondary | ICD-10-CM | POA: Diagnosis not present

## 2020-06-05 DIAGNOSIS — D509 Iron deficiency anemia, unspecified: Secondary | ICD-10-CM | POA: Diagnosis not present

## 2020-06-05 DIAGNOSIS — D631 Anemia in chronic kidney disease: Secondary | ICD-10-CM | POA: Diagnosis not present

## 2020-06-05 DIAGNOSIS — Z992 Dependence on renal dialysis: Secondary | ICD-10-CM | POA: Diagnosis not present

## 2020-06-05 DIAGNOSIS — N186 End stage renal disease: Secondary | ICD-10-CM | POA: Diagnosis not present

## 2020-06-06 DIAGNOSIS — N2581 Secondary hyperparathyroidism of renal origin: Secondary | ICD-10-CM | POA: Diagnosis not present

## 2020-06-06 DIAGNOSIS — N186 End stage renal disease: Secondary | ICD-10-CM | POA: Diagnosis not present

## 2020-06-06 DIAGNOSIS — D631 Anemia in chronic kidney disease: Secondary | ICD-10-CM | POA: Diagnosis not present

## 2020-06-06 DIAGNOSIS — D509 Iron deficiency anemia, unspecified: Secondary | ICD-10-CM | POA: Diagnosis not present

## 2020-06-06 DIAGNOSIS — Z992 Dependence on renal dialysis: Secondary | ICD-10-CM | POA: Diagnosis not present

## 2020-06-07 ENCOUNTER — Other Ambulatory Visit: Payer: Self-pay | Admitting: Internal Medicine

## 2020-06-07 DIAGNOSIS — D509 Iron deficiency anemia, unspecified: Secondary | ICD-10-CM | POA: Diagnosis not present

## 2020-06-07 DIAGNOSIS — D631 Anemia in chronic kidney disease: Secondary | ICD-10-CM | POA: Diagnosis not present

## 2020-06-07 DIAGNOSIS — N2581 Secondary hyperparathyroidism of renal origin: Secondary | ICD-10-CM | POA: Diagnosis not present

## 2020-06-07 DIAGNOSIS — Z992 Dependence on renal dialysis: Secondary | ICD-10-CM | POA: Diagnosis not present

## 2020-06-07 DIAGNOSIS — N186 End stage renal disease: Secondary | ICD-10-CM | POA: Diagnosis not present

## 2020-06-08 DIAGNOSIS — D631 Anemia in chronic kidney disease: Secondary | ICD-10-CM | POA: Diagnosis not present

## 2020-06-08 DIAGNOSIS — Z992 Dependence on renal dialysis: Secondary | ICD-10-CM | POA: Diagnosis not present

## 2020-06-08 DIAGNOSIS — N186 End stage renal disease: Secondary | ICD-10-CM | POA: Diagnosis not present

## 2020-06-08 DIAGNOSIS — N2581 Secondary hyperparathyroidism of renal origin: Secondary | ICD-10-CM | POA: Diagnosis not present

## 2020-06-08 DIAGNOSIS — D509 Iron deficiency anemia, unspecified: Secondary | ICD-10-CM | POA: Diagnosis not present

## 2020-06-09 DIAGNOSIS — D631 Anemia in chronic kidney disease: Secondary | ICD-10-CM | POA: Diagnosis not present

## 2020-06-09 DIAGNOSIS — N186 End stage renal disease: Secondary | ICD-10-CM | POA: Diagnosis not present

## 2020-06-09 DIAGNOSIS — Z992 Dependence on renal dialysis: Secondary | ICD-10-CM | POA: Diagnosis not present

## 2020-06-09 DIAGNOSIS — N2581 Secondary hyperparathyroidism of renal origin: Secondary | ICD-10-CM | POA: Diagnosis not present

## 2020-06-09 DIAGNOSIS — D509 Iron deficiency anemia, unspecified: Secondary | ICD-10-CM | POA: Diagnosis not present

## 2020-06-10 DIAGNOSIS — D509 Iron deficiency anemia, unspecified: Secondary | ICD-10-CM | POA: Diagnosis not present

## 2020-06-10 DIAGNOSIS — N186 End stage renal disease: Secondary | ICD-10-CM | POA: Diagnosis not present

## 2020-06-10 DIAGNOSIS — N2581 Secondary hyperparathyroidism of renal origin: Secondary | ICD-10-CM | POA: Diagnosis not present

## 2020-06-10 DIAGNOSIS — Z992 Dependence on renal dialysis: Secondary | ICD-10-CM | POA: Diagnosis not present

## 2020-06-10 DIAGNOSIS — D631 Anemia in chronic kidney disease: Secondary | ICD-10-CM | POA: Diagnosis not present

## 2020-06-11 DIAGNOSIS — Z992 Dependence on renal dialysis: Secondary | ICD-10-CM | POA: Diagnosis not present

## 2020-06-11 DIAGNOSIS — D631 Anemia in chronic kidney disease: Secondary | ICD-10-CM | POA: Diagnosis not present

## 2020-06-11 DIAGNOSIS — N186 End stage renal disease: Secondary | ICD-10-CM | POA: Diagnosis not present

## 2020-06-11 DIAGNOSIS — N2581 Secondary hyperparathyroidism of renal origin: Secondary | ICD-10-CM | POA: Diagnosis not present

## 2020-06-11 DIAGNOSIS — D509 Iron deficiency anemia, unspecified: Secondary | ICD-10-CM | POA: Diagnosis not present

## 2020-06-12 DIAGNOSIS — N186 End stage renal disease: Secondary | ICD-10-CM | POA: Diagnosis not present

## 2020-06-12 DIAGNOSIS — N2581 Secondary hyperparathyroidism of renal origin: Secondary | ICD-10-CM | POA: Diagnosis not present

## 2020-06-12 DIAGNOSIS — D631 Anemia in chronic kidney disease: Secondary | ICD-10-CM | POA: Diagnosis not present

## 2020-06-12 DIAGNOSIS — D509 Iron deficiency anemia, unspecified: Secondary | ICD-10-CM | POA: Diagnosis not present

## 2020-06-12 DIAGNOSIS — Z992 Dependence on renal dialysis: Secondary | ICD-10-CM | POA: Diagnosis not present

## 2020-06-13 DIAGNOSIS — D631 Anemia in chronic kidney disease: Secondary | ICD-10-CM | POA: Diagnosis not present

## 2020-06-13 DIAGNOSIS — Z992 Dependence on renal dialysis: Secondary | ICD-10-CM | POA: Diagnosis not present

## 2020-06-13 DIAGNOSIS — D509 Iron deficiency anemia, unspecified: Secondary | ICD-10-CM | POA: Diagnosis not present

## 2020-06-13 DIAGNOSIS — N186 End stage renal disease: Secondary | ICD-10-CM | POA: Diagnosis not present

## 2020-06-13 DIAGNOSIS — N2581 Secondary hyperparathyroidism of renal origin: Secondary | ICD-10-CM | POA: Diagnosis not present

## 2020-06-14 DIAGNOSIS — N2581 Secondary hyperparathyroidism of renal origin: Secondary | ICD-10-CM | POA: Diagnosis not present

## 2020-06-14 DIAGNOSIS — D631 Anemia in chronic kidney disease: Secondary | ICD-10-CM | POA: Diagnosis not present

## 2020-06-14 DIAGNOSIS — Z992 Dependence on renal dialysis: Secondary | ICD-10-CM | POA: Diagnosis not present

## 2020-06-14 DIAGNOSIS — N186 End stage renal disease: Secondary | ICD-10-CM | POA: Diagnosis not present

## 2020-06-14 DIAGNOSIS — D509 Iron deficiency anemia, unspecified: Secondary | ICD-10-CM | POA: Diagnosis not present

## 2020-06-15 DIAGNOSIS — N186 End stage renal disease: Secondary | ICD-10-CM | POA: Diagnosis not present

## 2020-06-15 DIAGNOSIS — D509 Iron deficiency anemia, unspecified: Secondary | ICD-10-CM | POA: Diagnosis not present

## 2020-06-15 DIAGNOSIS — N2581 Secondary hyperparathyroidism of renal origin: Secondary | ICD-10-CM | POA: Diagnosis not present

## 2020-06-15 DIAGNOSIS — Z992 Dependence on renal dialysis: Secondary | ICD-10-CM | POA: Diagnosis not present

## 2020-06-15 DIAGNOSIS — D631 Anemia in chronic kidney disease: Secondary | ICD-10-CM | POA: Diagnosis not present

## 2020-06-16 DIAGNOSIS — D631 Anemia in chronic kidney disease: Secondary | ICD-10-CM | POA: Diagnosis not present

## 2020-06-16 DIAGNOSIS — D509 Iron deficiency anemia, unspecified: Secondary | ICD-10-CM | POA: Diagnosis not present

## 2020-06-16 DIAGNOSIS — Z992 Dependence on renal dialysis: Secondary | ICD-10-CM | POA: Diagnosis not present

## 2020-06-16 DIAGNOSIS — N2581 Secondary hyperparathyroidism of renal origin: Secondary | ICD-10-CM | POA: Diagnosis not present

## 2020-06-16 DIAGNOSIS — N186 End stage renal disease: Secondary | ICD-10-CM | POA: Diagnosis not present

## 2020-06-17 DIAGNOSIS — N186 End stage renal disease: Secondary | ICD-10-CM | POA: Diagnosis not present

## 2020-06-17 DIAGNOSIS — N2581 Secondary hyperparathyroidism of renal origin: Secondary | ICD-10-CM | POA: Diagnosis not present

## 2020-06-17 DIAGNOSIS — D509 Iron deficiency anemia, unspecified: Secondary | ICD-10-CM | POA: Diagnosis not present

## 2020-06-17 DIAGNOSIS — Z992 Dependence on renal dialysis: Secondary | ICD-10-CM | POA: Diagnosis not present

## 2020-06-17 DIAGNOSIS — D631 Anemia in chronic kidney disease: Secondary | ICD-10-CM | POA: Diagnosis not present

## 2020-06-18 DIAGNOSIS — Z992 Dependence on renal dialysis: Secondary | ICD-10-CM | POA: Diagnosis not present

## 2020-06-18 DIAGNOSIS — D509 Iron deficiency anemia, unspecified: Secondary | ICD-10-CM | POA: Diagnosis not present

## 2020-06-18 DIAGNOSIS — N186 End stage renal disease: Secondary | ICD-10-CM | POA: Diagnosis not present

## 2020-06-18 DIAGNOSIS — N2581 Secondary hyperparathyroidism of renal origin: Secondary | ICD-10-CM | POA: Diagnosis not present

## 2020-06-18 DIAGNOSIS — D631 Anemia in chronic kidney disease: Secondary | ICD-10-CM | POA: Diagnosis not present

## 2020-06-19 DIAGNOSIS — N2581 Secondary hyperparathyroidism of renal origin: Secondary | ICD-10-CM | POA: Diagnosis not present

## 2020-06-19 DIAGNOSIS — D631 Anemia in chronic kidney disease: Secondary | ICD-10-CM | POA: Diagnosis not present

## 2020-06-19 DIAGNOSIS — N186 End stage renal disease: Secondary | ICD-10-CM | POA: Diagnosis not present

## 2020-06-19 DIAGNOSIS — D509 Iron deficiency anemia, unspecified: Secondary | ICD-10-CM | POA: Diagnosis not present

## 2020-06-19 DIAGNOSIS — Z992 Dependence on renal dialysis: Secondary | ICD-10-CM | POA: Diagnosis not present

## 2020-06-20 DIAGNOSIS — Z992 Dependence on renal dialysis: Secondary | ICD-10-CM | POA: Diagnosis not present

## 2020-06-20 DIAGNOSIS — D509 Iron deficiency anemia, unspecified: Secondary | ICD-10-CM | POA: Diagnosis not present

## 2020-06-20 DIAGNOSIS — D631 Anemia in chronic kidney disease: Secondary | ICD-10-CM | POA: Diagnosis not present

## 2020-06-20 DIAGNOSIS — N186 End stage renal disease: Secondary | ICD-10-CM | POA: Diagnosis not present

## 2020-06-20 DIAGNOSIS — N2581 Secondary hyperparathyroidism of renal origin: Secondary | ICD-10-CM | POA: Diagnosis not present

## 2020-06-21 DIAGNOSIS — D631 Anemia in chronic kidney disease: Secondary | ICD-10-CM | POA: Diagnosis not present

## 2020-06-21 DIAGNOSIS — N2581 Secondary hyperparathyroidism of renal origin: Secondary | ICD-10-CM | POA: Diagnosis not present

## 2020-06-21 DIAGNOSIS — Z992 Dependence on renal dialysis: Secondary | ICD-10-CM | POA: Diagnosis not present

## 2020-06-21 DIAGNOSIS — D509 Iron deficiency anemia, unspecified: Secondary | ICD-10-CM | POA: Diagnosis not present

## 2020-06-21 DIAGNOSIS — N186 End stage renal disease: Secondary | ICD-10-CM | POA: Diagnosis not present

## 2020-06-22 DIAGNOSIS — D631 Anemia in chronic kidney disease: Secondary | ICD-10-CM | POA: Diagnosis not present

## 2020-06-22 DIAGNOSIS — N186 End stage renal disease: Secondary | ICD-10-CM | POA: Diagnosis not present

## 2020-06-22 DIAGNOSIS — N2581 Secondary hyperparathyroidism of renal origin: Secondary | ICD-10-CM | POA: Diagnosis not present

## 2020-06-22 DIAGNOSIS — Z992 Dependence on renal dialysis: Secondary | ICD-10-CM | POA: Diagnosis not present

## 2020-06-22 DIAGNOSIS — D509 Iron deficiency anemia, unspecified: Secondary | ICD-10-CM | POA: Diagnosis not present

## 2020-06-23 DIAGNOSIS — N2581 Secondary hyperparathyroidism of renal origin: Secondary | ICD-10-CM | POA: Diagnosis not present

## 2020-06-23 DIAGNOSIS — D509 Iron deficiency anemia, unspecified: Secondary | ICD-10-CM | POA: Diagnosis not present

## 2020-06-23 DIAGNOSIS — D631 Anemia in chronic kidney disease: Secondary | ICD-10-CM | POA: Diagnosis not present

## 2020-06-23 DIAGNOSIS — N186 End stage renal disease: Secondary | ICD-10-CM | POA: Diagnosis not present

## 2020-06-23 DIAGNOSIS — Z992 Dependence on renal dialysis: Secondary | ICD-10-CM | POA: Diagnosis not present

## 2020-06-24 DIAGNOSIS — N186 End stage renal disease: Secondary | ICD-10-CM | POA: Diagnosis not present

## 2020-06-24 DIAGNOSIS — D509 Iron deficiency anemia, unspecified: Secondary | ICD-10-CM | POA: Diagnosis not present

## 2020-06-24 DIAGNOSIS — Z992 Dependence on renal dialysis: Secondary | ICD-10-CM | POA: Diagnosis not present

## 2020-06-24 DIAGNOSIS — N2581 Secondary hyperparathyroidism of renal origin: Secondary | ICD-10-CM | POA: Diagnosis not present

## 2020-06-25 DIAGNOSIS — N2581 Secondary hyperparathyroidism of renal origin: Secondary | ICD-10-CM | POA: Diagnosis not present

## 2020-06-25 DIAGNOSIS — Z992 Dependence on renal dialysis: Secondary | ICD-10-CM | POA: Diagnosis not present

## 2020-06-25 DIAGNOSIS — N186 End stage renal disease: Secondary | ICD-10-CM | POA: Diagnosis not present

## 2020-06-25 DIAGNOSIS — D509 Iron deficiency anemia, unspecified: Secondary | ICD-10-CM | POA: Diagnosis not present

## 2020-06-26 DIAGNOSIS — N186 End stage renal disease: Secondary | ICD-10-CM | POA: Diagnosis not present

## 2020-06-26 DIAGNOSIS — N2581 Secondary hyperparathyroidism of renal origin: Secondary | ICD-10-CM | POA: Diagnosis not present

## 2020-06-26 DIAGNOSIS — Z992 Dependence on renal dialysis: Secondary | ICD-10-CM | POA: Diagnosis not present

## 2020-06-26 DIAGNOSIS — D509 Iron deficiency anemia, unspecified: Secondary | ICD-10-CM | POA: Diagnosis not present

## 2020-06-27 DIAGNOSIS — N2581 Secondary hyperparathyroidism of renal origin: Secondary | ICD-10-CM | POA: Diagnosis not present

## 2020-06-27 DIAGNOSIS — D509 Iron deficiency anemia, unspecified: Secondary | ICD-10-CM | POA: Diagnosis not present

## 2020-06-27 DIAGNOSIS — Z992 Dependence on renal dialysis: Secondary | ICD-10-CM | POA: Diagnosis not present

## 2020-06-27 DIAGNOSIS — N186 End stage renal disease: Secondary | ICD-10-CM | POA: Diagnosis not present

## 2020-06-28 DIAGNOSIS — D509 Iron deficiency anemia, unspecified: Secondary | ICD-10-CM | POA: Diagnosis not present

## 2020-06-28 DIAGNOSIS — N2581 Secondary hyperparathyroidism of renal origin: Secondary | ICD-10-CM | POA: Diagnosis not present

## 2020-06-28 DIAGNOSIS — Z992 Dependence on renal dialysis: Secondary | ICD-10-CM | POA: Diagnosis not present

## 2020-06-28 DIAGNOSIS — N186 End stage renal disease: Secondary | ICD-10-CM | POA: Diagnosis not present

## 2020-06-29 DIAGNOSIS — Z992 Dependence on renal dialysis: Secondary | ICD-10-CM | POA: Diagnosis not present

## 2020-06-29 DIAGNOSIS — N186 End stage renal disease: Secondary | ICD-10-CM | POA: Diagnosis not present

## 2020-06-29 DIAGNOSIS — D509 Iron deficiency anemia, unspecified: Secondary | ICD-10-CM | POA: Diagnosis not present

## 2020-06-29 DIAGNOSIS — N2581 Secondary hyperparathyroidism of renal origin: Secondary | ICD-10-CM | POA: Diagnosis not present

## 2020-06-30 DIAGNOSIS — N2581 Secondary hyperparathyroidism of renal origin: Secondary | ICD-10-CM | POA: Diagnosis not present

## 2020-06-30 DIAGNOSIS — Z992 Dependence on renal dialysis: Secondary | ICD-10-CM | POA: Diagnosis not present

## 2020-06-30 DIAGNOSIS — D509 Iron deficiency anemia, unspecified: Secondary | ICD-10-CM | POA: Diagnosis not present

## 2020-06-30 DIAGNOSIS — N186 End stage renal disease: Secondary | ICD-10-CM | POA: Diagnosis not present

## 2020-07-01 DIAGNOSIS — Z992 Dependence on renal dialysis: Secondary | ICD-10-CM | POA: Diagnosis not present

## 2020-07-01 DIAGNOSIS — N186 End stage renal disease: Secondary | ICD-10-CM | POA: Diagnosis not present

## 2020-07-01 DIAGNOSIS — N2581 Secondary hyperparathyroidism of renal origin: Secondary | ICD-10-CM | POA: Diagnosis not present

## 2020-07-01 DIAGNOSIS — D509 Iron deficiency anemia, unspecified: Secondary | ICD-10-CM | POA: Diagnosis not present

## 2020-07-02 DIAGNOSIS — N2581 Secondary hyperparathyroidism of renal origin: Secondary | ICD-10-CM | POA: Diagnosis not present

## 2020-07-02 DIAGNOSIS — N186 End stage renal disease: Secondary | ICD-10-CM | POA: Diagnosis not present

## 2020-07-02 DIAGNOSIS — Z992 Dependence on renal dialysis: Secondary | ICD-10-CM | POA: Diagnosis not present

## 2020-07-02 DIAGNOSIS — D509 Iron deficiency anemia, unspecified: Secondary | ICD-10-CM | POA: Diagnosis not present

## 2020-07-03 DIAGNOSIS — N186 End stage renal disease: Secondary | ICD-10-CM | POA: Diagnosis not present

## 2020-07-03 DIAGNOSIS — D509 Iron deficiency anemia, unspecified: Secondary | ICD-10-CM | POA: Diagnosis not present

## 2020-07-03 DIAGNOSIS — N2581 Secondary hyperparathyroidism of renal origin: Secondary | ICD-10-CM | POA: Diagnosis not present

## 2020-07-03 DIAGNOSIS — Z992 Dependence on renal dialysis: Secondary | ICD-10-CM | POA: Diagnosis not present

## 2020-07-04 DIAGNOSIS — N186 End stage renal disease: Secondary | ICD-10-CM | POA: Diagnosis not present

## 2020-07-04 DIAGNOSIS — N2581 Secondary hyperparathyroidism of renal origin: Secondary | ICD-10-CM | POA: Diagnosis not present

## 2020-07-04 DIAGNOSIS — Z992 Dependence on renal dialysis: Secondary | ICD-10-CM | POA: Diagnosis not present

## 2020-07-04 DIAGNOSIS — D509 Iron deficiency anemia, unspecified: Secondary | ICD-10-CM | POA: Diagnosis not present

## 2020-07-05 DIAGNOSIS — Z992 Dependence on renal dialysis: Secondary | ICD-10-CM | POA: Diagnosis not present

## 2020-07-05 DIAGNOSIS — D509 Iron deficiency anemia, unspecified: Secondary | ICD-10-CM | POA: Diagnosis not present

## 2020-07-05 DIAGNOSIS — N186 End stage renal disease: Secondary | ICD-10-CM | POA: Diagnosis not present

## 2020-07-05 DIAGNOSIS — N2581 Secondary hyperparathyroidism of renal origin: Secondary | ICD-10-CM | POA: Diagnosis not present

## 2020-07-06 DIAGNOSIS — D509 Iron deficiency anemia, unspecified: Secondary | ICD-10-CM | POA: Diagnosis not present

## 2020-07-06 DIAGNOSIS — N2581 Secondary hyperparathyroidism of renal origin: Secondary | ICD-10-CM | POA: Diagnosis not present

## 2020-07-06 DIAGNOSIS — N186 End stage renal disease: Secondary | ICD-10-CM | POA: Diagnosis not present

## 2020-07-06 DIAGNOSIS — Z992 Dependence on renal dialysis: Secondary | ICD-10-CM | POA: Diagnosis not present

## 2020-07-07 DIAGNOSIS — D509 Iron deficiency anemia, unspecified: Secondary | ICD-10-CM | POA: Diagnosis not present

## 2020-07-07 DIAGNOSIS — N186 End stage renal disease: Secondary | ICD-10-CM | POA: Diagnosis not present

## 2020-07-07 DIAGNOSIS — Z992 Dependence on renal dialysis: Secondary | ICD-10-CM | POA: Diagnosis not present

## 2020-07-07 DIAGNOSIS — N2581 Secondary hyperparathyroidism of renal origin: Secondary | ICD-10-CM | POA: Diagnosis not present

## 2020-07-08 DIAGNOSIS — Z992 Dependence on renal dialysis: Secondary | ICD-10-CM | POA: Diagnosis not present

## 2020-07-08 DIAGNOSIS — N2581 Secondary hyperparathyroidism of renal origin: Secondary | ICD-10-CM | POA: Diagnosis not present

## 2020-07-08 DIAGNOSIS — N186 End stage renal disease: Secondary | ICD-10-CM | POA: Diagnosis not present

## 2020-07-08 DIAGNOSIS — D509 Iron deficiency anemia, unspecified: Secondary | ICD-10-CM | POA: Diagnosis not present

## 2020-07-09 DIAGNOSIS — Z992 Dependence on renal dialysis: Secondary | ICD-10-CM | POA: Diagnosis not present

## 2020-07-09 DIAGNOSIS — D509 Iron deficiency anemia, unspecified: Secondary | ICD-10-CM | POA: Diagnosis not present

## 2020-07-09 DIAGNOSIS — N186 End stage renal disease: Secondary | ICD-10-CM | POA: Diagnosis not present

## 2020-07-09 DIAGNOSIS — N2581 Secondary hyperparathyroidism of renal origin: Secondary | ICD-10-CM | POA: Diagnosis not present

## 2020-07-10 DIAGNOSIS — D509 Iron deficiency anemia, unspecified: Secondary | ICD-10-CM | POA: Diagnosis not present

## 2020-07-10 DIAGNOSIS — Z992 Dependence on renal dialysis: Secondary | ICD-10-CM | POA: Diagnosis not present

## 2020-07-10 DIAGNOSIS — N186 End stage renal disease: Secondary | ICD-10-CM | POA: Diagnosis not present

## 2020-07-10 DIAGNOSIS — N2581 Secondary hyperparathyroidism of renal origin: Secondary | ICD-10-CM | POA: Diagnosis not present

## 2020-07-11 DIAGNOSIS — Z992 Dependence on renal dialysis: Secondary | ICD-10-CM | POA: Diagnosis not present

## 2020-07-11 DIAGNOSIS — N2581 Secondary hyperparathyroidism of renal origin: Secondary | ICD-10-CM | POA: Diagnosis not present

## 2020-07-11 DIAGNOSIS — D509 Iron deficiency anemia, unspecified: Secondary | ICD-10-CM | POA: Diagnosis not present

## 2020-07-11 DIAGNOSIS — N186 End stage renal disease: Secondary | ICD-10-CM | POA: Diagnosis not present

## 2020-07-12 DIAGNOSIS — Z992 Dependence on renal dialysis: Secondary | ICD-10-CM | POA: Diagnosis not present

## 2020-07-12 DIAGNOSIS — N186 End stage renal disease: Secondary | ICD-10-CM | POA: Diagnosis not present

## 2020-07-12 DIAGNOSIS — D509 Iron deficiency anemia, unspecified: Secondary | ICD-10-CM | POA: Diagnosis not present

## 2020-07-12 DIAGNOSIS — N2581 Secondary hyperparathyroidism of renal origin: Secondary | ICD-10-CM | POA: Diagnosis not present

## 2020-07-13 DIAGNOSIS — N186 End stage renal disease: Secondary | ICD-10-CM | POA: Diagnosis not present

## 2020-07-13 DIAGNOSIS — D509 Iron deficiency anemia, unspecified: Secondary | ICD-10-CM | POA: Diagnosis not present

## 2020-07-13 DIAGNOSIS — Z992 Dependence on renal dialysis: Secondary | ICD-10-CM | POA: Diagnosis not present

## 2020-07-13 DIAGNOSIS — N2581 Secondary hyperparathyroidism of renal origin: Secondary | ICD-10-CM | POA: Diagnosis not present

## 2020-07-14 DIAGNOSIS — D509 Iron deficiency anemia, unspecified: Secondary | ICD-10-CM | POA: Diagnosis not present

## 2020-07-14 DIAGNOSIS — Z992 Dependence on renal dialysis: Secondary | ICD-10-CM | POA: Diagnosis not present

## 2020-07-14 DIAGNOSIS — N2581 Secondary hyperparathyroidism of renal origin: Secondary | ICD-10-CM | POA: Diagnosis not present

## 2020-07-14 DIAGNOSIS — N186 End stage renal disease: Secondary | ICD-10-CM | POA: Diagnosis not present

## 2020-07-15 DIAGNOSIS — D509 Iron deficiency anemia, unspecified: Secondary | ICD-10-CM | POA: Diagnosis not present

## 2020-07-15 DIAGNOSIS — N2581 Secondary hyperparathyroidism of renal origin: Secondary | ICD-10-CM | POA: Diagnosis not present

## 2020-07-15 DIAGNOSIS — Z992 Dependence on renal dialysis: Secondary | ICD-10-CM | POA: Diagnosis not present

## 2020-07-15 DIAGNOSIS — N186 End stage renal disease: Secondary | ICD-10-CM | POA: Diagnosis not present

## 2020-07-16 ENCOUNTER — Encounter: Payer: Self-pay | Admitting: Internal Medicine

## 2020-07-16 ENCOUNTER — Ambulatory Visit (INDEPENDENT_AMBULATORY_CARE_PROVIDER_SITE_OTHER): Payer: Medicare Other | Admitting: Internal Medicine

## 2020-07-16 ENCOUNTER — Other Ambulatory Visit: Payer: Self-pay

## 2020-07-16 VITALS — BP 176/66 | HR 65 | Ht 66.0 in | Wt 150.0 lb

## 2020-07-16 DIAGNOSIS — R0609 Other forms of dyspnea: Secondary | ICD-10-CM

## 2020-07-16 DIAGNOSIS — N2581 Secondary hyperparathyroidism of renal origin: Secondary | ICD-10-CM | POA: Diagnosis not present

## 2020-07-16 DIAGNOSIS — N186 End stage renal disease: Secondary | ICD-10-CM | POA: Diagnosis not present

## 2020-07-16 DIAGNOSIS — D509 Iron deficiency anemia, unspecified: Secondary | ICD-10-CM | POA: Diagnosis not present

## 2020-07-16 DIAGNOSIS — I10 Essential (primary) hypertension: Secondary | ICD-10-CM | POA: Diagnosis not present

## 2020-07-16 DIAGNOSIS — R0600 Dyspnea, unspecified: Secondary | ICD-10-CM | POA: Diagnosis not present

## 2020-07-16 DIAGNOSIS — I639 Cerebral infarction, unspecified: Secondary | ICD-10-CM | POA: Diagnosis not present

## 2020-07-16 DIAGNOSIS — I5022 Chronic systolic (congestive) heart failure: Secondary | ICD-10-CM | POA: Diagnosis not present

## 2020-07-16 DIAGNOSIS — Z992 Dependence on renal dialysis: Secondary | ICD-10-CM | POA: Diagnosis not present

## 2020-07-16 NOTE — Patient Instructions (Signed)
Medication Instructions:  Your physician recommends that you continue on your current medications as directed. Please refer to the Current Medication list given to you today.  *If you need a refill on your cardiac medications before your next appointment, please call your pharmacy*  Follow-Up: At [facility], you and your health needs are our priority.  As part of our continuing mission to provide you with exceptional heart care, we have created designated Provider Care Teams.  These Care Teams include your primary Cardiologist (physician) and Advanced Practice Providers (APPs -  Physician Assistants and Nurse Practitioners) who all work together to provide you with the care you need, when you need it.  We recommend signing up for the patient portal called "MyChart".  Sign up information is provided on this After Visit Summary.  MyChart is used to connect with patients for Virtual Visits (Telemedicine).  Patients are able to view lab/test results, encounter notes, upcoming appointments, etc.  Non-urgent messages can be sent to your provider as well.   To learn more about what you can do with MyChart, go to [email or web address].    Your next appointment:   2 week(s)  The format for your next appointment:   In Person  Provider:   You may see [name], MD or one of the following Advanced Practice Providers on your designated Care Team:    Willhoite Hodgkins, NP  Christell Faith, PA-C  Marrianne Mood, PA-C  Cadence [place], [place]  Laurann [place], NP

## 2020-07-16 NOTE — Progress Notes (Signed)
Follow-up Outpatient Visit Date: 07/16/2020  Primary Care Provider: Crecencio Mc, MD [address] [place] [address]  Chief Complaint: Shortness of breath and fatigue  HPI:  [name] is a 75 y.o. male with history of [name]-stage renal disease on peritoneal dialysis, cardiomyopathy with an EF of 40-45%, prior abnormal stress test, hypertension, hyperlipidemia, peripheral arterial disease, sleep apnea, renal cell carcinoma status post left heminephrectomy, tobacco abuse, stroke, and adenocarcinoma of the appendix, who presents for follow-up of fatigue and exertional dyspnea.  He was last seen in our office by Ignacia Bayley, NP, in 03/2020.  He was hospitalized in 12/2019 with lack of coordination and paresthesias along his right side and was found to have multiple small infarcts involving the left cerebral hemisphere.  He was noted to have a short dissection involving the proximal left subclavian artery.  No intervention was recommended by vascular surgery.  Subsequent event monitor showed occasional PACs and PVCs but no atrial fibrillation.  At the time of his follow-up visit in November, [name] complained of increasing exertional dyspnea and fatigue.  He was started on isosorbide mononitrate for blood pressure control and antianginal therapy in case his worsening DOE was an anginal equivalent.  He was hospitalized in 04/2020 with acute on chronic HFrEF and hypertensive urgency.  There was question of paroxysmal atrial fibrillation when EMS first arrived on scene, though this could not be confirmed by any tracings.  Decision was made to defer anticoagulation.  Today, [name] reports that he feels about the same with exertional dyspnea that has been present for at least a year.  He even gets worn out in the morning when he is dressing himself.  He denies chest pain and palpitations though he occasionally has transient lightheadedness after he completes his dialysis.  Blood  pressures have been labile, ranging from 140-190/60-80.  He is compliant with his medications, though he is no longer on hydralazine or isosorbide mononitrate due to significant headaches.  [name] reports that he was not able to receive his last EPO injection, due to hypertension.  He is scheduled for follow-up with Dr. Juleen China tomorrow.  --------------------------------------------------------------------------------------------------  Past Medical History:  Diagnosis Date  . Adenocarcinoma of appendix [facility]) Jan 2006   right kidney, s/p cryoablation  . Cardiomyopathy ([place])    a. 12/2018 Echo: EF 40-45%, global HK. Asc Ao 3.7cm; b. 01/2019 [facility]i MV: small, mild, fixed basal and mid antlat defect - scar vs artifact. Small, mild mid and apical inf minimally reversible defect, likely scar w/ peri-infarct ischemia. Coronary and Ao atherosclerosis; c. 12/2019 Echo: EF 40-45%, glob HK, Gr1 DD. Nl RV fxn. Sev dil LA. Mild MR. Mild-mod Ao sclerosis w/o stenosis. Asc Ao 68mm.  . Carotid arterial disease ([place])    a. 01/2020 RICA 1-39%, RCCA/RECA < 01%, LICA 7-49%, LCCA/LECA <50%.  . Claudication ([place])    a. 02/2020 ABI/TBI: R 1.08/0.95, L 1.00/0.70.  Marland Kitchen Complication of anesthesia    had to be woken up slowly as his bp was elevated when did this quickly  . Diabetes mellitus without complication ([place])   . ESRD (Chauntay Paszkiewicz stage renal disease) ([place])    a. Peritoneal Dialysis pt.  . Hyperlipidemia   . Hypertension   . Migraine    cluster  . Neuromuscular disorder ([place])    left lower extrem neuropathy  . Obstructive sleep apnea    no OSA since had facial surgery with dr. Kathyrn Sheriff in 1997  . PAD (peripheral artery disease) (  [facility]) Feb 2009   nonobstructing, renal angiogram [name]  . Renal cell carcinoma 2004   left kidney heminephrectomy  . Renal insufficiency   . Stroke [facility])    a. 12/2019 MRI: Small acute infarcts involving the left cerebral hemisphere and several chronic infarcts.  Marland Kitchen TIA (transient  ischemic attack)    no residual but left leg and foot still feel heavy  . tobacco abuse    Past Surgical History:  Procedure Laterality Date  . CAPD INSERTION N/A 03/01/2019   Procedure: LAPAROSCOPIC INSERTION CONTINUOUS AMBULATORY PERITONEAL DIALYSIS  (CAPD) CATHETER;  Surgeon: Algernon Huxley, MD;  Location: [facility] ORS;  Service: Vascular;  Laterality: N/A;  . CARDIAC CATHETERIZATION     Dr. [name] did this to assess his renal artery  . cyst removal  12/25/2015   Spine L4 and L5  . heminephrectomy  2004   for renal cell CA  . RENAL CRYOABLATION  Jan 2006   right kidney,  [name]  . sciatica       Current Meds  Medication Sig  . albuterol (VENTOLIN HFA) 108 (90 Base) MCG/ACT inhaler Inhale 2 puffs into the lungs every 6 (six) hours as needed for wheezing or shortness of breath.  Marland Kitchen amLODipine (NORVASC) 10 MG tablet TAKE 1 TABLET BY MOUTH ONCE DAILY  . aspirin 81 MG tablet Take 1 tablet (81 mg total) by mouth daily.  . calcitRIOL (ROCALTROL) 0.25 MCG capsule Take 0.25 mcg by mouth every Monday, Wednesday, and Friday.  . carvedilol (COREG) 25 MG tablet Take 25 mg by mouth 2 (two) times daily with a meal.  . cloNIDine (CATAPRES) 0.2 MG tablet Take 0.2 mg by mouth 3 (three) times daily.  . clopidogrel (PLAVIX) 75 MG tablet Take 1 tablet (75 mg total) by mouth daily.  . diphenhydrAMINE (BENADRYL) 25 MG tablet Take 25 mg by mouth every 6 (six) hours as needed for allergies.  Marland Kitchen doxazosin (CARDURA) 8 MG tablet Take 8 mg by mouth daily.  Marland Kitchen ezetimibe (ZETIA) 10 MG tablet Take 1 tablet (10 mg total) by mouth daily.  . fluticasone (FLONASE) 50 MCG/ACT nasal spray Place 2 sprays into both nostrils daily.  Marland Kitchen gentamicin cream (GARAMYCIN) 0.1 % Apply 1 application topically daily.  . multivitamin (RENA-VIT) TABS tablet Take 1 tablet by mouth at bedtime.  . nicotine (NICODERM CQ - DOSED IN MG/24 HOURS) 14 mg/24hr patch 14 mg patch daily to chest wall, okay to substitute generic  . rosuvastatin (CRESTOR)  20 MG tablet Take 1 tablet (20 mg total) by mouth at bedtime.  . sodium bicarbonate 325 MG tablet Take 325 mg by mouth 2 (two) times daily.  . sodium chloride (OCEAN) 0.65 % SOLN nasal spray Place 1 spray into both nostrils as needed for congestion.  . [DISCONTINUED] hydrALAZINE (APRESOLINE) 25 MG tablet Take 1 tablet (25 mg total) by mouth 3 (three) times daily.   Current Facility-Administered Medications for the 07/16/20 encounter (Office Visit) with Bradly Sangiovanni, [place] Gave, MD  Medication  . ipratropium-albuterol (DUONEB) 0.5-2.5 (3) MG/3ML nebulizer solution 3 mL    Allergies: Irbesartan, Cymbalta [duloxetine hcl], Atorvastatin, and Bystolic [nebivolol hcl]  Social History   Tobacco Use  . Smoking status: Current Every Day Smoker    Packs/day: 1.00    Years: 50.00    Pack years: 50.00    Types: Cigarettes  . Smokeless tobacco: Never Used  Vaping Use  . Vaping Use: Former  . Devices: tried but did not like  Substance Use Topics  . Alcohol  use: Not Currently  . Drug use: No    Family History  Problem Relation Age of Onset  . Hypertension Mother   . Cancer Mother        breast  . Aneurysm Mother   . Coronary artery disease Father   . Hypertension Father   . Stroke Father 27  . Heart disease Father   . Heart attack Father 76  . Aneurysm Maternal Grandmother        brain  . Aneurysm Paternal Grandmother        brain  . Coronary artery disease Paternal Grandfather   . Heart disease Brother        valvular heart disease  . COPD Brother   . Hypertension Brother   . Stroke Paternal Uncle     Review of Systems: A 12-system review of systems was performed and was negative except as noted in the HPI.  --------------------------------------------------------------------------------------------------  Physical Exam: BP (!) 176/66 (BP Location: Left Arm, Patient Position: Sitting, Cuff Size: Normal)   Pulse 65   Ht 5\' 6"  (1.676 m)   Wt 150 lb (68 kg)   SpO2 98%   BMI 24.21  kg/m   General:  NAD. Neck: No JVD or HJR. Lungs: Clear to auscultation bilaterally without wheezes or crackles. Heart: Regular rate and rhythm without murmurs, rubs, or gallops. Abdomen: Soft, nontender, nondistended. Extremities: No lower extremity edema.  EKG: Normal sinus rhythm with LAFB, LVH with abnormal repolarization, and mild QT prolongation (QTc 495 ms).  No significant change since 05/11/2020.  Lab Results  Component Value Date   WBC 9.0 05/14/2020   HGB 8.6 (L) 05/14/2020   HCT 24.9 (L) 05/14/2020   MCV 90.5 05/14/2020   PLT 147 (L) 05/14/2020    Lab Results  Component Value Date   NA 144 05/14/2020   K 3.9 05/14/2020   CL 109 05/14/2020   CO2 25 05/14/2020   BUN 49 (H) 05/14/2020   CREATININE 7.28 (H) 05/14/2020   GLUCOSE 109 (H) 05/14/2020   ALT 10 01/14/2020    Lab Results  Component Value Date   CHOL 122 05/12/2020   HDL 25 (L) 05/12/2020   LDLCALC 61 05/12/2020   LDLDIRECT 134.0 05/11/2017   TRIG 180 (H) 05/12/2020   CHOLHDL 4.9 05/12/2020    --------------------------------------------------------------------------------------------------  ASSESSMENT AND PLAN: Dyspnea on exertion and chronic HFrEF: [name] continues to have significant dyspnea with modest activity consistent with NYHA class III heart failure likely due to his uncontrolled hypertension and underlying cardiomyopathy with moderately reduced LVEF.  He appears euvolemic on examination today.  There is certainly concern that he has underlying ischemic heart disease that may be contributing to his cardiomyopathy and dyspnea.  We discussed proceeding with cardiac catheterization and have agreed to work on improved blood pressure control and to have [name] follow-up with Dr. Juleen China tomorrow, including labs to reassess his chronic anemia.  If hemoglobin is stable/improved and dyspnea/fatigue do not improve with improved blood pressure control, we will discuss proceeding with right and  left heart catheterization at his next visit with [name].  Uncontrolled hypertension: Blood pressure still quite elevated.  [name] has been intolerant of hydralazine and isosorbide mononitrate due to headaches, though he notes that his dyspnea and fatigue seem to be a little bit better on isosorbide mononitrate.  This would suggest an element of ischemic heart disease.  I think he would benefit from addition of an ACE inhibitor or ARB, which he previously did not  tolerate due to hyperkalemia.  However, as he is now on peritoneal dialysis, hopefully hyperkalemia will be less of an issue.  I have reached out to Dr. Juleen China, who will discuss this further with [name] at their visit tomorrow.  I will defer medication changes today.  Stroke: Multiple potential etiologies including atheroembolic disease and potential cardioembolic phenomenon.  There was concern for PAF during hospitalization in 04/2020, though this could never be confirmed.  Preceding monitor in 02/2020 was without evidence of atrial fibrillation.  We will defer antithrombotic therapy in favor of aspirin and clopidogrel.  Continue statin therapy for target LDL less than 70.  Lashae Wollenberg-stage renal disease: Continue peritoneal dialysis per Dr. Juleen China.  Follow-up: Return to clinic in 2 weeks.  [name], MD 07/16/2020 5:10 PM

## 2020-07-17 DIAGNOSIS — Z992 Dependence on renal dialysis: Secondary | ICD-10-CM | POA: Diagnosis not present

## 2020-07-17 DIAGNOSIS — N186 End stage renal disease: Secondary | ICD-10-CM | POA: Diagnosis not present

## 2020-07-17 DIAGNOSIS — D509 Iron deficiency anemia, unspecified: Secondary | ICD-10-CM | POA: Diagnosis not present

## 2020-07-17 DIAGNOSIS — N2581 Secondary hyperparathyroidism of renal origin: Secondary | ICD-10-CM | POA: Diagnosis not present

## 2020-07-18 DIAGNOSIS — N2581 Secondary hyperparathyroidism of renal origin: Secondary | ICD-10-CM | POA: Diagnosis not present

## 2020-07-18 DIAGNOSIS — N186 End stage renal disease: Secondary | ICD-10-CM | POA: Diagnosis not present

## 2020-07-18 DIAGNOSIS — D509 Iron deficiency anemia, unspecified: Secondary | ICD-10-CM | POA: Diagnosis not present

## 2020-07-18 DIAGNOSIS — Z992 Dependence on renal dialysis: Secondary | ICD-10-CM | POA: Diagnosis not present

## 2020-07-19 DIAGNOSIS — D509 Iron deficiency anemia, unspecified: Secondary | ICD-10-CM | POA: Diagnosis not present

## 2020-07-19 DIAGNOSIS — N186 End stage renal disease: Secondary | ICD-10-CM | POA: Diagnosis not present

## 2020-07-19 DIAGNOSIS — N2581 Secondary hyperparathyroidism of renal origin: Secondary | ICD-10-CM | POA: Diagnosis not present

## 2020-07-19 DIAGNOSIS — Z992 Dependence on renal dialysis: Secondary | ICD-10-CM | POA: Diagnosis not present

## 2020-07-20 DIAGNOSIS — N2581 Secondary hyperparathyroidism of renal origin: Secondary | ICD-10-CM | POA: Diagnosis not present

## 2020-07-20 DIAGNOSIS — N186 End stage renal disease: Secondary | ICD-10-CM | POA: Diagnosis not present

## 2020-07-20 DIAGNOSIS — Z992 Dependence on renal dialysis: Secondary | ICD-10-CM | POA: Diagnosis not present

## 2020-07-20 DIAGNOSIS — D509 Iron deficiency anemia, unspecified: Secondary | ICD-10-CM | POA: Diagnosis not present

## 2020-07-21 DIAGNOSIS — D509 Iron deficiency anemia, unspecified: Secondary | ICD-10-CM | POA: Diagnosis not present

## 2020-07-21 DIAGNOSIS — N2581 Secondary hyperparathyroidism of renal origin: Secondary | ICD-10-CM | POA: Diagnosis not present

## 2020-07-21 DIAGNOSIS — N186 End stage renal disease: Secondary | ICD-10-CM | POA: Diagnosis not present

## 2020-07-21 DIAGNOSIS — Z992 Dependence on renal dialysis: Secondary | ICD-10-CM | POA: Diagnosis not present

## 2020-07-22 DIAGNOSIS — N2581 Secondary hyperparathyroidism of renal origin: Secondary | ICD-10-CM | POA: Diagnosis not present

## 2020-07-22 DIAGNOSIS — D631 Anemia in chronic kidney disease: Secondary | ICD-10-CM | POA: Diagnosis not present

## 2020-07-22 DIAGNOSIS — Z992 Dependence on renal dialysis: Secondary | ICD-10-CM | POA: Diagnosis not present

## 2020-07-22 DIAGNOSIS — D509 Iron deficiency anemia, unspecified: Secondary | ICD-10-CM | POA: Diagnosis not present

## 2020-07-22 DIAGNOSIS — N186 End stage renal disease: Secondary | ICD-10-CM | POA: Diagnosis not present

## 2020-07-23 DIAGNOSIS — D631 Anemia in chronic kidney disease: Secondary | ICD-10-CM | POA: Diagnosis not present

## 2020-07-23 DIAGNOSIS — Z992 Dependence on renal dialysis: Secondary | ICD-10-CM | POA: Diagnosis not present

## 2020-07-23 DIAGNOSIS — D509 Iron deficiency anemia, unspecified: Secondary | ICD-10-CM | POA: Diagnosis not present

## 2020-07-23 DIAGNOSIS — N186 End stage renal disease: Secondary | ICD-10-CM | POA: Diagnosis not present

## 2020-07-23 DIAGNOSIS — N2581 Secondary hyperparathyroidism of renal origin: Secondary | ICD-10-CM | POA: Diagnosis not present

## 2020-07-24 DIAGNOSIS — D631 Anemia in chronic kidney disease: Secondary | ICD-10-CM | POA: Diagnosis not present

## 2020-07-24 DIAGNOSIS — D509 Iron deficiency anemia, unspecified: Secondary | ICD-10-CM | POA: Diagnosis not present

## 2020-07-24 DIAGNOSIS — Z992 Dependence on renal dialysis: Secondary | ICD-10-CM | POA: Diagnosis not present

## 2020-07-24 DIAGNOSIS — N186 End stage renal disease: Secondary | ICD-10-CM | POA: Diagnosis not present

## 2020-07-24 DIAGNOSIS — N2581 Secondary hyperparathyroidism of renal origin: Secondary | ICD-10-CM | POA: Diagnosis not present

## 2020-07-25 DIAGNOSIS — D631 Anemia in chronic kidney disease: Secondary | ICD-10-CM | POA: Diagnosis not present

## 2020-07-25 DIAGNOSIS — N186 End stage renal disease: Secondary | ICD-10-CM | POA: Diagnosis not present

## 2020-07-25 DIAGNOSIS — N2581 Secondary hyperparathyroidism of renal origin: Secondary | ICD-10-CM | POA: Diagnosis not present

## 2020-07-25 DIAGNOSIS — Z992 Dependence on renal dialysis: Secondary | ICD-10-CM | POA: Diagnosis not present

## 2020-07-25 DIAGNOSIS — D509 Iron deficiency anemia, unspecified: Secondary | ICD-10-CM | POA: Diagnosis not present

## 2020-07-26 DIAGNOSIS — N186 End stage renal disease: Secondary | ICD-10-CM | POA: Diagnosis not present

## 2020-07-26 DIAGNOSIS — N2581 Secondary hyperparathyroidism of renal origin: Secondary | ICD-10-CM | POA: Diagnosis not present

## 2020-07-26 DIAGNOSIS — D631 Anemia in chronic kidney disease: Secondary | ICD-10-CM | POA: Diagnosis not present

## 2020-07-26 DIAGNOSIS — D509 Iron deficiency anemia, unspecified: Secondary | ICD-10-CM | POA: Diagnosis not present

## 2020-07-26 DIAGNOSIS — Z992 Dependence on renal dialysis: Secondary | ICD-10-CM | POA: Diagnosis not present

## 2020-07-27 DIAGNOSIS — N2581 Secondary hyperparathyroidism of renal origin: Secondary | ICD-10-CM | POA: Diagnosis not present

## 2020-07-27 DIAGNOSIS — D631 Anemia in chronic kidney disease: Secondary | ICD-10-CM | POA: Diagnosis not present

## 2020-07-27 DIAGNOSIS — D509 Iron deficiency anemia, unspecified: Secondary | ICD-10-CM | POA: Diagnosis not present

## 2020-07-27 DIAGNOSIS — Z992 Dependence on renal dialysis: Secondary | ICD-10-CM | POA: Diagnosis not present

## 2020-07-27 DIAGNOSIS — N186 End stage renal disease: Secondary | ICD-10-CM | POA: Diagnosis not present

## 2020-07-28 DIAGNOSIS — D631 Anemia in chronic kidney disease: Secondary | ICD-10-CM | POA: Diagnosis not present

## 2020-07-28 DIAGNOSIS — D509 Iron deficiency anemia, unspecified: Secondary | ICD-10-CM | POA: Diagnosis not present

## 2020-07-28 DIAGNOSIS — N2581 Secondary hyperparathyroidism of renal origin: Secondary | ICD-10-CM | POA: Diagnosis not present

## 2020-07-28 DIAGNOSIS — Z992 Dependence on renal dialysis: Secondary | ICD-10-CM | POA: Diagnosis not present

## 2020-07-28 DIAGNOSIS — N186 End stage renal disease: Secondary | ICD-10-CM | POA: Diagnosis not present

## 2020-07-29 DIAGNOSIS — N2581 Secondary hyperparathyroidism of renal origin: Secondary | ICD-10-CM | POA: Diagnosis not present

## 2020-07-29 DIAGNOSIS — D509 Iron deficiency anemia, unspecified: Secondary | ICD-10-CM | POA: Diagnosis not present

## 2020-07-29 DIAGNOSIS — D631 Anemia in chronic kidney disease: Secondary | ICD-10-CM | POA: Diagnosis not present

## 2020-07-29 DIAGNOSIS — Z992 Dependence on renal dialysis: Secondary | ICD-10-CM | POA: Diagnosis not present

## 2020-07-29 DIAGNOSIS — N186 End stage renal disease: Secondary | ICD-10-CM | POA: Diagnosis not present

## 2020-07-30 DIAGNOSIS — D509 Iron deficiency anemia, unspecified: Secondary | ICD-10-CM | POA: Diagnosis not present

## 2020-07-30 DIAGNOSIS — D631 Anemia in chronic kidney disease: Secondary | ICD-10-CM | POA: Diagnosis not present

## 2020-07-30 DIAGNOSIS — N2581 Secondary hyperparathyroidism of renal origin: Secondary | ICD-10-CM | POA: Diagnosis not present

## 2020-07-30 DIAGNOSIS — N186 End stage renal disease: Secondary | ICD-10-CM | POA: Diagnosis not present

## 2020-07-30 DIAGNOSIS — Z992 Dependence on renal dialysis: Secondary | ICD-10-CM | POA: Diagnosis not present

## 2020-07-31 ENCOUNTER — Other Ambulatory Visit: Payer: Self-pay

## 2020-07-31 ENCOUNTER — Encounter: Payer: Self-pay | Admitting: Nurse Practitioner

## 2020-07-31 ENCOUNTER — Ambulatory Visit (INDEPENDENT_AMBULATORY_CARE_PROVIDER_SITE_OTHER): Payer: Medicare Other | Admitting: Nurse Practitioner

## 2020-07-31 VITALS — BP 180/70 | HR 59 | Ht 66.0 in | Wt 154.0 lb

## 2020-07-31 DIAGNOSIS — D509 Iron deficiency anemia, unspecified: Secondary | ICD-10-CM | POA: Diagnosis not present

## 2020-07-31 DIAGNOSIS — R0609 Other forms of dyspnea: Secondary | ICD-10-CM

## 2020-07-31 DIAGNOSIS — I5022 Chronic systolic (congestive) heart failure: Secondary | ICD-10-CM

## 2020-07-31 DIAGNOSIS — N186 End stage renal disease: Secondary | ICD-10-CM

## 2020-07-31 DIAGNOSIS — I639 Cerebral infarction, unspecified: Secondary | ICD-10-CM

## 2020-07-31 DIAGNOSIS — I429 Cardiomyopathy, unspecified: Secondary | ICD-10-CM

## 2020-07-31 DIAGNOSIS — R0600 Dyspnea, unspecified: Secondary | ICD-10-CM | POA: Diagnosis not present

## 2020-07-31 DIAGNOSIS — D631 Anemia in chronic kidney disease: Secondary | ICD-10-CM | POA: Diagnosis not present

## 2020-07-31 DIAGNOSIS — I739 Peripheral vascular disease, unspecified: Secondary | ICD-10-CM | POA: Diagnosis not present

## 2020-07-31 DIAGNOSIS — I10 Essential (primary) hypertension: Secondary | ICD-10-CM

## 2020-07-31 DIAGNOSIS — N2581 Secondary hyperparathyroidism of renal origin: Secondary | ICD-10-CM | POA: Diagnosis not present

## 2020-07-31 DIAGNOSIS — Z992 Dependence on renal dialysis: Secondary | ICD-10-CM | POA: Diagnosis not present

## 2020-07-31 DIAGNOSIS — E782 Mixed hyperlipidemia: Secondary | ICD-10-CM

## 2020-07-31 NOTE — Patient Instructions (Signed)
Medication Instructions:  Your physician has recommended you make the following change in your medication:   1. Change Clonidine 0.2 mg to Three Times per day.   *If you need a refill on your cardiac medications before your next appointment, please call your pharmacy*   Lab Work: None  If you have labs (blood work) drawn today and your tests are completely normal, you will receive your results only by: Marland Kitchen MyChart Message (if you have MyChart) OR . A paper copy in the mail If you have any lab test that is abnormal or we need to change your treatment, we will call you to review the results.   Testing/Procedures: None   Follow-Up: At [facility], you and your health needs are our priority.  As part of our continuing mission to provide you with exceptional heart care, we have created designated Provider Care Teams.  These Care Teams include your primary Cardiologist (physician) and Advanced Practice Providers (APPs -  Physician Assistants and Nurse Practitioners) who all work together to provide you with the care you need, when you need it.   Your next appointment:   3 month(s)  The format for your next appointment:   In Person  Provider:   [name], MD

## 2020-07-31 NOTE — Progress Notes (Signed)
Office Visit    Patient Name: [name] Date of Encounter: 07/31/2020  Primary Care Provider:  Crecencio Mc, MD Primary Cardiologist:  [name], MD  Chief Complaint    [name] is a 75 year-old male with history of cardiomyopathy (? ICM vs NICM), chronic HFrEF (EF 40-45%), HTN, hyperlipidemia, LVH, mild dilatation of aortic root @ 39 mm, ESRD on PD, carotid artery disease, stroke, diabetes mellitus, anemia, ESRD on peritoneal dialysis, PAD, tobacco abuse, renal cell carcinoma s/p left heminephrectomy, and adenocarcinoma of appendix, who presents for follow-up of his hypertension and dyspnea/fatigue.   Past Medical History    Past Medical History:  Diagnosis Date  . Adenocarcinoma of appendix [facility]) Jan 2006   right kidney, s/p cryoablation  . Cardiomyopathy ([place])    a. 12/2018 Echo: EF 40-45%, global HK. Asc Ao 3.7cm; b. 01/2019 Lexi MV: small, mild, fixed basal and mid antlat defect - scar vs artifact. Small, mild mid and apical inf minimally reversible defect, likely scar w/ peri-infarct ischemia. Coronary and Ao atherosclerosis; c. 12/2019 Echo: EF 40-45%, glob HK, Gr1 DD. Nl RV fxn. Sev dil LA. Mild MR. Mild-mod Ao sclerosis w/o stenosis. Asc Ao 54mm.  . Carotid arterial disease ([place])    a. 01/2020 RICA 1-39%, RCCA/RECA < 93%, LICA 8-10%, LCCA/LECA <50%.  . Claudication ([place])    a. 02/2020 ABI/TBI: R 1.08/0.95, L 1.00/0.70.  Marland Kitchen Complication of anesthesia    had to be woken up slowly as his bp was elevated when did this quickly  . Diabetes mellitus without complication ([place])   . ESRD (end stage renal disease) ([place])    a. Peritoneal Dialysis pt.  . Hyperlipidemia   . Hypertension   . Migraine    cluster  . Neuromuscular disorder ([place])    left lower extrem neuropathy  . Obstructive sleep apnea    no OSA since had facial surgery with dr. Kathyrn Sheriff in 1997  . PAD (peripheral artery disease) [facility]) Feb 2009   nonobstructing, renal angiogram ([place])  . Renal cell  carcinoma 2004   left kidney heminephrectomy  . Renal insufficiency   . Stroke [facility])    a. 12/2019 MRI: Small acute infarcts involving the left cerebral hemisphere and several chronic infarcts.  Marland Kitchen TIA (transient ischemic attack)    no residual but left leg and foot still feel heavy  . tobacco abuse    Past Surgical History:  Procedure Laterality Date  . CAPD INSERTION N/A 03/01/2019   Procedure: LAPAROSCOPIC INSERTION CONTINUOUS AMBULATORY PERITONEAL DIALYSIS  (CAPD) CATHETER;  Surgeon: Algernon Huxley, MD;  Location: [facility] ORS;  Service: Vascular;  Laterality: N/A;  . CARDIAC CATHETERIZATION     Dr. [name] did this to assess his renal artery  . cyst removal  12/25/2015   Spine L4 and L5  . heminephrectomy  2004   for renal cell CA  . RENAL CRYOABLATION  Jan 2006   right kidney,  [name]  . sciatica      Allergies  Allergies  Allergen Reactions  . Irbesartan Other (See Comments)    hyperkalemia  . Cymbalta [Duloxetine Hcl]   . Atorvastatin Other (See Comments)    Muscle pain  . Bystolic [Nebivolol Hcl]     Extreme fatigue     History of Present Illness    Pleasant 75 year-old male with history of cardiomyopathy, chronic HFrEF, HTN, hyperlipidemia, LVH, mild dilatation of aortic root @ 39 mm, ESRD on peritoneal dialysis, carotid artery disease, stroke, anemia,  diabetes mellitus, PAD, tobacco abuse, renal cell carcinoma s/p left heminephrectomy, and adenocarcinoma of appendix, who presents for follow-up of his hypertension and dyspnea/fatigue. 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 Subsequent event monitoring did not show atrial fib. In follow-up, he was started on isosorbide mononitrate for BP control and antianginal for worsening DOE as anginal  equivalent but it was stopped due to significant headaches. He was also intolerant of hydralazine due to headache. In Dec. 2021, he was hospitalized for acute on chronic HFrEF and hypertensive urgency. There was question of PAF when EMS first arrived on scene, though this could not be confirmed by any tracings.  Decision was made to defer anticoagulation. He was placed on hydralazine, however, this, along with imdur, were subsequently discontinued b/c he felt that they were causing headaches.  He was last seen 2 weeks ago on 2/23 for evaluation of his dyspnea, chronic HFrEF, and uncontrolled hypertension. At that visit, he described exertional dyspnea present for at least 1 year describing as "getting worn out when dressing himself" in the mornings. He reported transient lightheadedness after dialysis. BP was 176/66 in the office with home readings 140-190/60-80. No medication changes were made as he was to have follow-up the next day with his nephrologist, Dr. Juleen China. At last visit, Dr. Saunders Revel suggested if no improvement in dyspnea/fatigue w/tighter BP control, and if hgb was stable, we would discuss right and left heart catheterization at next visit.   Today, he feels somewhat less dyspnea/fatigue than 2 weeks ago. He saw Dr. Juleen China 2 weeks ago and no medication changes were made. He had lab work and iron and epo infusion yesterday. He was able to walk in from the parking lot to our office without stopping, and felt better doing so today than last visit. He describes a component of muscle fatigue in his hips and legs that cause him to feel fatigued, though this is stable overtime and his ABIs were nl in October 2021 . He also does not tolerate cold weather as well as warm and has not been as active recently. He recently began to perform peritoneal dialysis 3 x manually daily at home because he feels better than when he uses the machine. He reports that he felt too full when he was using the machine twice daily.   He has also recently been seeing a wellness specialist who has prescribed supplements.  He thinks that since seeing her, his energy levels have improved, hip pain reduced, and fatigue/dyspnea seems to getting better.  He continues to smoke 1 ppd and admits to smoking more when the weather is cold because he is less active and sits inside and watches tv.   Home Medications    Prior to Admission medications   Medication Sig Start Date End Date Taking? Authorizing Provider  albuterol (VENTOLIN HFA) 108 (90 Base) MCG/ACT inhaler Inhale 2 puffs into the lungs every 6 (six) hours as needed for wheezing or shortness of breath. 04/12/19   Crecencio Mc, MD  amLODipine (NORVASC) 10 MG tablet TAKE 1 TABLET BY MOUTH ONCE DAILY 06/09/20   Crecencio Mc, MD  aspirin 81 MG tablet Take 1 tablet (81 mg total) by mouth daily. 05/16/14   Crecencio Mc, MD  calcitRIOL (ROCALTROL) 0.25 MCG capsule Take 0.25 mcg by  mouth every Monday, Wednesday, and Friday.    [provider]  carvedilol (COREG) 25 MG tablet Take 25 mg by mouth 2 (two) times daily with a meal. 07/07/17   [provider]  cloNIDine (CATAPRES) 0.2 MG tablet Take 0.2 mg by mouth 3 (three) times daily. 03/06/20   [provider]  clopidogrel (PLAVIX) 75 MG tablet Take 1 tablet (75 mg total) by mouth daily. 04/02/20   Theora Gianotti, NP  diphenhydrAMINE (BENADRYL) 25 MG tablet Take 25 mg by mouth every 6 (six) hours as needed for allergies.    [provider]  doxazosin (CARDURA) 8 MG tablet Take 8 mg by mouth daily. 03/29/20   [provider]  ezetimibe (ZETIA) 10 MG tablet Take 1 tablet (10 mg total) by mouth daily. 05/09/19 04/02/29  Theora Gianotti, NP  fluticasone (FLONASE) 50 MCG/ACT nasal spray Place 2 sprays into both nostrils daily. 05/29/18   Crecencio Mc, MD  gentamicin cream (GARAMYCIN) 0.1 % Apply 1 application topically daily. 05/14/20   Loletha Grayer, MD  multivitamin  (RENA-VIT) TABS tablet Take 1 tablet by mouth at bedtime. 12/21/19   [provider]  nicotine (NICODERM CQ - DOSED IN MG/24 HOURS) 14 mg/24hr patch 14 mg patch daily to chest wall, okay to substitute generic 01/15/20   Loletha Grayer, MD  rosuvastatin (CRESTOR) 20 MG tablet Take 1 tablet (20 mg total) by mouth at bedtime. 04/02/20 04/02/21  Theora Gianotti, NP  sodium bicarbonate 325 MG tablet Take 325 mg by mouth 2 (two) times daily. 02/14/18   [provider]  sodium chloride (OCEAN) 0.65 % SOLN nasal spray Place 1 spray into both nostrils as needed for congestion. 05/14/20   Loletha Grayer, MD    Review of Systems    Has dyspnea/fatigue somewhat improved from last visit. Continues to "give out" quickly when walking 50-100 yards, though better over the past 2 wks.  Productive cough w/"off-white" phlegm "all the time."  He denies chest discomfort, palpitations, syncope, early satiety, orthopnea, weight gain, lightheadedness or dizziness.  All other systems reviewed and are otherwise negative except as noted above.  Physical Exam    VS:  Vitals:   07/31/20 0907 07/31/20 0910  BP: (!) 190/70 (!) 180/70  Pulse: (!) 59   SpO2: 96%   Body mass index is 24.86 kg/m. GEN: Well nourished, well developed, in no acute distress. HEENT: normal. Neck: Supple, no JVD, carotid bruits, or masses. Cardiac: RRR, 2/6 systolic murmur @ rusb. No rubs or gallops. No clubbing, cyanosis. 1-2+ pitting ankle edema bilaterally. Wearing boots - skin warm, pink underneath socks Respiratory:  Respirations regular and unlabored, coarse breath sounds that clear with deep breathing.  GI: Soft, nontender, nondistended, BS + x 4. MS: no deformity or atrophy. Skin: warm and dry, no rash. Neuro:  Strength and sensation are intact. Psych: Normal affect.  Accessory Clinical Findings     Lab Results  Component Value Date   WBC 9.0 05/14/2020   HGB 8.6 (L) 05/14/2020   HCT 24.9 (L)  05/14/2020   MCV 90.5 05/14/2020   PLT 147 (L) 05/14/2020   Lab Results  Component Value Date   CREATININE 7.28 (H) 05/14/2020   BUN 49 (H) 05/14/2020   NA 144 05/14/2020   K 3.9 05/14/2020   CL 109 05/14/2020   CO2 25 05/14/2020   Lab Results  Component Value Date   ALT 10 01/14/2020   AST 9 (L) 01/14/2020   ALKPHOS 101  01/14/2020   BILITOT 0.9 01/14/2020   Lab Results  Component Value Date   CHOL 122 05/12/2020   HDL 25 (L) 05/12/2020   LDLCALC 61 05/12/2020   LDLDIRECT 134.0 05/11/2017   TRIG 180 (H) 05/12/2020   CHOLHDL 4.9 05/12/2020    Lab Results  Component Value Date   HGBA1C 5.9 (H) 05/12/2020    Assessment & Plan    1.  Chronic HFrEF/Cardiomyopathy (Ischemic vs nonischemic): LVEF 40-45% by echo in 04/2020. He continues to have dyspnea/fatigue that may be angina equivalent, though this has improved over the past two wks in the setting of seeing a holistic provider who is giving him oral supplements that he believes will increase nitric oxide production. He is feeling benefit from this treatment in that he has less fatigue and hip pain, and notes more stamina/less DOE. He denies chest discomfort, palpitations, syncope, orthopnea, weight gain, lightheadedness. He would like to continue to monitor symptoms over the next few months, and not proceed with ischemic work up at this time.  He does have 2+ pitting edema in both lower extremities which he states is normal for him and resolves overnight. He admits to eating high sodium foods frequently.  BP remains poorly controlled.  He's only been taking his clonidine 0.2mg  BID instead of TID as rx.  I have encouraged him to take clonidine as rx.  We will have him see Dr. Saunders Revel in 3 months and to call back if symptoms worsen prior to that time.   2. Hypertension: BP remains poorly controlled.  Repeat BP by me 170/70.  He has not been taking clonidine as rx - taking bid instead of tid.  Prior h/o intolerance to hydralazine/nitrate  (headaches), acei/arb/mra (hyperK+).  With reduced EF may benefit from retrial of acei/arb/arni, however will continue to defer to nephrology re: safety in setting of ESRD/PD.  I have advised him to take clonidine as rx (TID). Encouraged decreased sodium intake and smoking cessation.   3. Tobacco abuse: He continues to smoke 1 ppd and admits to smoking more during cold weather because he is not as active. He occasionally wears nicotine patches. Complete cessation advised. He nods his head in agreement to work on reducing his smoking frequency and states he knows that he should quit altogether.  Suspect that ongoing tob abuse/undiagnosed COPD is likely playing a role in his DOE.  4. Hyperlipidemia: Last LDL 61 Dec. 2021. He states he may have recently stopped his Zetia although it was refilled in December. He denies having problems tolerating it and agrees to resume. Continue Crestor.   5.  ESRD:  On PD.  Seems to do better w/ volume mgmt since he's increased to 3 sessions/day @ home.  F/u nephrology.  6. PAD:  Stable symptoms and ABIs in Oct.  Cont med rx.  F/u w/ vasc surgery.    7.  Disposition: He does not wish to proceed with scheduling cardiac catheterization today. He will continue to monitor symptoms and return for follow-up with Dr. Saunders Revel in 3 months. He agrees to call if symptoms progress.    Pafford Hodgkins, NP 07/31/2020, 9:47 AM

## 2020-08-01 DIAGNOSIS — N2581 Secondary hyperparathyroidism of renal origin: Secondary | ICD-10-CM | POA: Diagnosis not present

## 2020-08-01 DIAGNOSIS — N186 End stage renal disease: Secondary | ICD-10-CM | POA: Diagnosis not present

## 2020-08-01 DIAGNOSIS — D509 Iron deficiency anemia, unspecified: Secondary | ICD-10-CM | POA: Diagnosis not present

## 2020-08-01 DIAGNOSIS — Z992 Dependence on renal dialysis: Secondary | ICD-10-CM | POA: Diagnosis not present

## 2020-08-01 DIAGNOSIS — D631 Anemia in chronic kidney disease: Secondary | ICD-10-CM | POA: Diagnosis not present

## 2020-08-02 DIAGNOSIS — D631 Anemia in chronic kidney disease: Secondary | ICD-10-CM | POA: Diagnosis not present

## 2020-08-02 DIAGNOSIS — D509 Iron deficiency anemia, unspecified: Secondary | ICD-10-CM | POA: Diagnosis not present

## 2020-08-02 DIAGNOSIS — Z992 Dependence on renal dialysis: Secondary | ICD-10-CM | POA: Diagnosis not present

## 2020-08-02 DIAGNOSIS — N2581 Secondary hyperparathyroidism of renal origin: Secondary | ICD-10-CM | POA: Diagnosis not present

## 2020-08-02 DIAGNOSIS — N186 End stage renal disease: Secondary | ICD-10-CM | POA: Diagnosis not present

## 2020-08-03 DIAGNOSIS — D509 Iron deficiency anemia, unspecified: Secondary | ICD-10-CM | POA: Diagnosis not present

## 2020-08-03 DIAGNOSIS — D631 Anemia in chronic kidney disease: Secondary | ICD-10-CM | POA: Diagnosis not present

## 2020-08-03 DIAGNOSIS — N186 End stage renal disease: Secondary | ICD-10-CM | POA: Diagnosis not present

## 2020-08-03 DIAGNOSIS — Z992 Dependence on renal dialysis: Secondary | ICD-10-CM | POA: Diagnosis not present

## 2020-08-03 DIAGNOSIS — N2581 Secondary hyperparathyroidism of renal origin: Secondary | ICD-10-CM | POA: Diagnosis not present

## 2020-08-04 DIAGNOSIS — N2581 Secondary hyperparathyroidism of renal origin: Secondary | ICD-10-CM | POA: Diagnosis not present

## 2020-08-04 DIAGNOSIS — Z992 Dependence on renal dialysis: Secondary | ICD-10-CM | POA: Diagnosis not present

## 2020-08-04 DIAGNOSIS — D631 Anemia in chronic kidney disease: Secondary | ICD-10-CM | POA: Diagnosis not present

## 2020-08-04 DIAGNOSIS — D509 Iron deficiency anemia, unspecified: Secondary | ICD-10-CM | POA: Diagnosis not present

## 2020-08-04 DIAGNOSIS — N186 End stage renal disease: Secondary | ICD-10-CM | POA: Diagnosis not present

## 2020-08-05 DIAGNOSIS — D631 Anemia in chronic kidney disease: Secondary | ICD-10-CM | POA: Diagnosis not present

## 2020-08-05 DIAGNOSIS — N186 End stage renal disease: Secondary | ICD-10-CM | POA: Diagnosis not present

## 2020-08-05 DIAGNOSIS — N2581 Secondary hyperparathyroidism of renal origin: Secondary | ICD-10-CM | POA: Diagnosis not present

## 2020-08-05 DIAGNOSIS — D509 Iron deficiency anemia, unspecified: Secondary | ICD-10-CM | POA: Diagnosis not present

## 2020-08-05 DIAGNOSIS — Z992 Dependence on renal dialysis: Secondary | ICD-10-CM | POA: Diagnosis not present

## 2020-08-06 ENCOUNTER — Other Ambulatory Visit: Payer: Self-pay

## 2020-08-06 ENCOUNTER — Telehealth: Payer: Self-pay | Admitting: Internal Medicine

## 2020-08-06 ENCOUNTER — Encounter: Payer: Self-pay | Admitting: Internal Medicine

## 2020-08-06 ENCOUNTER — Emergency Department: Payer: Medicare Other

## 2020-08-06 ENCOUNTER — Inpatient Hospital Stay
Admission: EM | Admit: 2020-08-06 | Discharge: 2020-08-09 | DRG: 291 | Disposition: A | Discharge status: Home / Self Care | Payer: Medicare Other | Attending: Internal Medicine | Admitting: Internal Medicine

## 2020-08-06 DIAGNOSIS — I120 Hypertensive chronic kidney disease with stage 5 chronic kidney disease or end stage renal disease: Secondary | ICD-10-CM | POA: Diagnosis not present

## 2020-08-06 DIAGNOSIS — I10 Essential (primary) hypertension: Secondary | ICD-10-CM | POA: Diagnosis not present

## 2020-08-06 DIAGNOSIS — Z905 Acquired absence of kidney: Secondary | ICD-10-CM

## 2020-08-06 DIAGNOSIS — C61 Malignant neoplasm of prostate: Secondary | ICD-10-CM | POA: Diagnosis not present

## 2020-08-06 DIAGNOSIS — Z85038 Personal history of other malignant neoplasm of large intestine: Secondary | ICD-10-CM | POA: Diagnosis not present

## 2020-08-06 DIAGNOSIS — N186 End stage renal disease: Principal | ICD-10-CM

## 2020-08-06 DIAGNOSIS — Z85528 Personal history of other malignant neoplasm of kidney: Secondary | ICD-10-CM | POA: Diagnosis not present

## 2020-08-06 DIAGNOSIS — Z7902 Long term (current) use of antithrombotics/antiplatelets: Secondary | ICD-10-CM | POA: Diagnosis not present

## 2020-08-06 DIAGNOSIS — Z8546 Personal history of malignant neoplasm of prostate: Secondary | ICD-10-CM | POA: Diagnosis not present

## 2020-08-06 DIAGNOSIS — I132 Hypertensive heart and chronic kidney disease with heart failure and with stage 5 chronic kidney disease, or end stage renal disease: Secondary | ICD-10-CM | POA: Diagnosis not present

## 2020-08-06 DIAGNOSIS — Z825 Family history of asthma and other chronic lower respiratory diseases: Secondary | ICD-10-CM

## 2020-08-06 DIAGNOSIS — G43909 Migraine, unspecified, not intractable, without status migrainosus: Secondary | ICD-10-CM | POA: Diagnosis present

## 2020-08-06 DIAGNOSIS — Z20822 Contact with and (suspected) exposure to covid-19: Secondary | ICD-10-CM | POA: Diagnosis present

## 2020-08-06 DIAGNOSIS — R0602 Shortness of breath: Secondary | ICD-10-CM

## 2020-08-06 DIAGNOSIS — E1122 Type 2 diabetes mellitus with diabetic chronic kidney disease: Secondary | ICD-10-CM | POA: Diagnosis present

## 2020-08-06 DIAGNOSIS — I517 Cardiomegaly: Secondary | ICD-10-CM | POA: Diagnosis not present

## 2020-08-06 DIAGNOSIS — N2581 Secondary hyperparathyroidism of renal origin: Secondary | ICD-10-CM | POA: Diagnosis present

## 2020-08-06 DIAGNOSIS — E8770 Fluid overload, unspecified: Secondary | ICD-10-CM | POA: Diagnosis not present

## 2020-08-06 DIAGNOSIS — Z79899 Other long term (current) drug therapy: Secondary | ICD-10-CM

## 2020-08-06 DIAGNOSIS — D638 Anemia in other chronic diseases classified elsewhere: Secondary | ICD-10-CM | POA: Diagnosis present

## 2020-08-06 DIAGNOSIS — Z7982 Long term (current) use of aspirin: Secondary | ICD-10-CM | POA: Diagnosis not present

## 2020-08-06 DIAGNOSIS — E8779 Other fluid overload: Secondary | ICD-10-CM | POA: Diagnosis not present

## 2020-08-06 DIAGNOSIS — Z8249 Family history of ischemic heart disease and other diseases of the circulatory system: Secondary | ICD-10-CM

## 2020-08-06 DIAGNOSIS — J9621 Acute and chronic respiratory failure with hypoxia: Secondary | ICD-10-CM | POA: Diagnosis present

## 2020-08-06 DIAGNOSIS — Z823 Family history of stroke: Secondary | ICD-10-CM | POA: Diagnosis not present

## 2020-08-06 DIAGNOSIS — E875 Hyperkalemia: Secondary | ICD-10-CM | POA: Diagnosis present

## 2020-08-06 DIAGNOSIS — I5033 Acute on chronic diastolic (congestive) heart failure: Secondary | ICD-10-CM | POA: Diagnosis not present

## 2020-08-06 DIAGNOSIS — Z716 Tobacco abuse counseling: Secondary | ICD-10-CM

## 2020-08-06 DIAGNOSIS — I429 Cardiomyopathy, unspecified: Secondary | ICD-10-CM | POA: Diagnosis present

## 2020-08-06 DIAGNOSIS — Z992 Dependence on renal dialysis: Secondary | ICD-10-CM

## 2020-08-06 DIAGNOSIS — I5023 Acute on chronic systolic (congestive) heart failure: Secondary | ICD-10-CM | POA: Diagnosis present

## 2020-08-06 DIAGNOSIS — I700 Atherosclerosis of aorta: Secondary | ICD-10-CM | POA: Diagnosis not present

## 2020-08-06 DIAGNOSIS — I160 Hypertensive urgency: Secondary | ICD-10-CM | POA: Diagnosis present

## 2020-08-06 DIAGNOSIS — G4733 Obstructive sleep apnea (adult) (pediatric): Secondary | ICD-10-CM | POA: Diagnosis present

## 2020-08-06 DIAGNOSIS — D631 Anemia in chronic kidney disease: Secondary | ICD-10-CM | POA: Diagnosis present

## 2020-08-06 DIAGNOSIS — R001 Bradycardia, unspecified: Secondary | ICD-10-CM | POA: Diagnosis present

## 2020-08-06 DIAGNOSIS — Z4902 Encounter for fitting and adjustment of peritoneal dialysis catheter: Secondary | ICD-10-CM | POA: Diagnosis not present

## 2020-08-06 DIAGNOSIS — Z8673 Personal history of transient ischemic attack (TIA), and cerebral infarction without residual deficits: Secondary | ICD-10-CM | POA: Diagnosis not present

## 2020-08-06 DIAGNOSIS — I2510 Atherosclerotic heart disease of native coronary artery without angina pectoris: Secondary | ICD-10-CM | POA: Diagnosis present

## 2020-08-06 DIAGNOSIS — E1151 Type 2 diabetes mellitus with diabetic peripheral angiopathy without gangrene: Secondary | ICD-10-CM | POA: Diagnosis present

## 2020-08-06 DIAGNOSIS — J449 Chronic obstructive pulmonary disease, unspecified: Secondary | ICD-10-CM | POA: Diagnosis present

## 2020-08-06 DIAGNOSIS — F17210 Nicotine dependence, cigarettes, uncomplicated: Secondary | ICD-10-CM | POA: Diagnosis present

## 2020-08-06 DIAGNOSIS — E785 Hyperlipidemia, unspecified: Secondary | ICD-10-CM | POA: Diagnosis present

## 2020-08-06 LAB — BASIC METABOLIC PANEL
Anion gap: 9 (ref 5–15)
BUN: 41 mg/dL — ABNORMAL HIGH (ref 8–23)
CO2: 25 mmol/L (ref 22–32)
Calcium: 8.3 mg/dL — ABNORMAL LOW (ref 8.9–10.3)
Chloride: 106 mmol/L (ref 98–111)
Creatinine, Ser: 6.51 mg/dL — ABNORMAL HIGH (ref 0.61–1.24)
GFR, Estimated: 8 mL/min — ABNORMAL LOW (ref >60)
Glucose, Bld: 132 mg/dL — ABNORMAL HIGH (ref 70–99)
Potassium: 4.5 mmol/L (ref 3.5–5.1)
Sodium: 140 mmol/L (ref 135–145)

## 2020-08-06 LAB — CBC
HCT: 31.6 % — ABNORMAL LOW (ref 39.0–52.0)
Hemoglobin: 10.7 g/dL — ABNORMAL LOW (ref 13.0–17.0)
MCH: 31.7 pg (ref 26.0–34.0)
MCHC: 33.9 g/dL (ref 30.0–36.0)
MCV: 93.5 fL (ref 80.0–100.0)
Platelets: 188 10*3/uL (ref 150–400)
RBC: 3.38 MIL/uL — ABNORMAL LOW (ref 4.22–5.81)
RDW: 15.5 % (ref 11.5–15.5)
WBC: 10.3 10*3/uL (ref 4.0–10.5)
nRBC: 0.3 % — ABNORMAL HIGH (ref 0.0–0.2)

## 2020-08-06 LAB — TROPONIN I (HIGH SENSITIVITY)
Troponin I (High Sensitivity): 14 ng/L (ref <18)
Troponin I (High Sensitivity): 16 ng/L (ref <18)

## 2020-08-06 LAB — RESP PANEL BY RT-PCR (FLU A&B, COVID) ARPGX2
Influenza A by PCR: NEGATIVE
Influenza B by PCR: NEGATIVE
SARS Coronavirus 2 by RT PCR: NEGATIVE

## 2020-08-06 LAB — GLUCOSE, CAPILLARY: Glucose-Capillary: 92 mg/dL (ref 70–99)

## 2020-08-06 LAB — BRAIN NATRIURETIC PEPTIDE: B Natriuretic Peptide: 1785.3 pg/mL — ABNORMAL HIGH (ref 0.0–100.0)

## 2020-08-06 LAB — MAGNESIUM: Magnesium: 2.2 mg/dL (ref 1.7–2.4)

## 2020-08-06 MED ORDER — DELFLEX-LC/2.5% DEXTROSE 394 MOSM/L IP SOLN
INTRAPERITONEAL | Status: DC
  Filled 2020-08-06: qty 3000

## 2020-08-06 MED ORDER — SODIUM CHLORIDE 0.9% FLUSH: 3.0000 mL | INTRAVENOUS | Status: DC | PRN

## 2020-08-06 MED ORDER — INSULIN ASPART 100 UNIT/ML ~~LOC~~ SOLN: 0.0000 [IU] | Freq: Every day | SUBCUTANEOUS | Status: DC

## 2020-08-06 MED ORDER — SODIUM CHLORIDE 0.9% FLUSH
3.0000 mL | Freq: Two times a day (BID) | INTRAVENOUS | Status: DC
  Administered 2020-08-06 – 2020-08-08 (×5): 3 mL via INTRAVENOUS

## 2020-08-06 MED ORDER — FUROSEMIDE 10 MG/ML IJ SOLN
80.0000 mg | Freq: Two times a day (BID) | INTRAMUSCULAR | Status: DC
  Administered 2020-08-06: 80 mg via INTRAVENOUS
  Filled 2020-08-06: qty 8

## 2020-08-06 MED ORDER — SODIUM CHLORIDE 0.9 % IV SOLN: 250.0000 mL | INTRAVENOUS | Status: DC | PRN

## 2020-08-06 MED ORDER — ACETAMINOPHEN 325 MG PO TABS: 650.0000 mg | ORAL_TABLET | ORAL | Status: DC | PRN

## 2020-08-06 MED ORDER — GENTAMICIN SULFATE 0.1 % EX CREA
1.0000 "application " | TOPICAL_CREAM | Freq: Every day | CUTANEOUS | Status: DC
  Administered 2020-08-08 – 2020-08-09 (×2): 1 via TOPICAL
  Filled 2020-08-06: qty 15

## 2020-08-06 MED ORDER — ONDANSETRON HCL 4 MG/2ML IJ SOLN: 4.0000 mg | Freq: Four times a day (QID) | INTRAMUSCULAR | Status: DC | PRN

## 2020-08-06 MED ORDER — HEPARIN 1000 UNIT/ML FOR PERITONEAL DIALYSIS
3000.0000 [IU] | INTRAMUSCULAR | Status: DC | PRN
  Filled 2020-08-06: qty 3

## 2020-08-06 MED ORDER — CARVEDILOL 25 MG PO TABS
25.0000 mg | ORAL_TABLET | Freq: Two times a day (BID) | ORAL | Status: DC
  Administered 2020-08-07 – 2020-08-09 (×5): 25 mg via ORAL
  Filled 2020-08-06 (×5): qty 1

## 2020-08-06 MED ORDER — HEPARIN SODIUM (PORCINE) 5000 UNIT/ML IJ SOLN
5000.0000 [IU] | Freq: Three times a day (TID) | INTRAMUSCULAR | Status: DC
  Administered 2020-08-06 – 2020-08-09 (×9): 5000 [IU] via SUBCUTANEOUS
  Filled 2020-08-06 (×9): qty 1

## 2020-08-06 MED ORDER — INSULIN ASPART 100 UNIT/ML ~~LOC~~ SOLN
0.0000 [IU] | Freq: Three times a day (TID) | SUBCUTANEOUS | Status: DC
  Administered 2020-08-07: 1 [IU] via SUBCUTANEOUS
  Administered 2020-08-08: 2 [IU] via SUBCUTANEOUS
  Administered 2020-08-08: 1 [IU] via SUBCUTANEOUS
  Filled 2020-08-06 (×4): qty 1

## 2020-08-06 MED ORDER — FUROSEMIDE 10 MG/ML IJ SOLN
60.0000 mg | Freq: Two times a day (BID) | INTRAMUSCULAR | Status: DC
  Administered 2020-08-07: 60 mg via INTRAVENOUS
  Filled 2020-08-06: qty 6

## 2020-08-06 NOTE — ED Provider Notes (Signed)
[facility] Emergency Department Provider Note  ____________________________________________   Event Date/Time   First MD Initiated Contact with Patient 08/06/20 1835     (approximate)  I have reviewed the triage vital signs and the nursing notes.   HISTORY  Chief Complaint Shortness of Breath    HPI [name] is a 75 y.o. male with past medical history as below including end-stage renal disease on peritoneal dialysis here with shortness of breath.  The patient states that for the last 2 weeks or so, he has had progressively worsening shortness of breath.  He felt like it was his peritoneal dialysis, so he has been mainly pulling off more fluid because he felt like it was causing his abdomen to become distended.  He has gained several pounds over the last several days.  He has also noticed increasing lower extremity swelling and cough, with orthopnea.  Denies any other medication changes.  He is adamant that he has not missed any dialysis sessions.  He does continue to make urine.  Denies any fevers or chills.  No other complaints.  No chest pain        Past Medical History:  Diagnosis Date  . 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 Cardiomyopathy ([place])    a. 12/2018 Echo: EF 40-45%, global HK. Asc Ao 3.7cm; b. 01/2019 Lexi MV: small, mild, fixed basal and mid antlat defect - scar vs artifact. Small, mild mid and apical inf minimally reversible defect, likely scar w/ peri-infarct ischemia. Coronary and Ao atherosclerosis; c. 12/2019 Echo: EF 40-45%, glob HK, Gr1 DD. Nl RV fxn. Sev dil LA. Mild MR. Mild-mod Ao sclerosis w/o stenosis. Asc Ao 85mm.  . Carotid arterial disease ([place])    a. 01/2020 RICA 1-39%, RCCA/RECA < 36%, LICA 6-44%, LCCA/LECA <50%.  . Claudication ([place])    a. 02/2020 ABI/TBI: R 1.08/0.95, L 1.00/0.70.  Marland Kitchen Complication of anesthesia    had to be woken up slowly as his bp was elevated when did this  quickly  . Diabetes mellitus without complication ([place])   . ESRD (end stage renal disease) ([place])    a. Peritoneal Dialysis pt.  . Hyperlipidemia   . Hypertension   . Migraine    cluster  . Neuromuscular disorder ([place])    left lower extrem neuropathy  . Obstructive sleep apnea    no OSA since had facial surgery with dr. Kathyrn Sheriff in 1997  . PAD (peripheral artery disease) [facility]) Feb 2009   nonobstructing, renal angiogram ([place])  . Renal cell carcinoma 2004   left kidney heminephrectomy  . Renal insufficiency   . Stroke [facility])    a. 12/2019 MRI: Small acute infarcts involving the left cerebral hemisphere and several chronic infarcts.  Marland Kitchen TIA (transient ischemic attack)    no residual but left leg and foot still feel heavy  . tobacco abuse     Patient Active Problem List   Diagnosis Date Noted  . ESRD on peritoneal dialysis ([place]) 08/06/2020  . HFrEF (heart failure with reduced ejection fraction) ([place])   . AF (paroxysmal atrial fibrillation) ([place])   . Anemia of chronic disease   . Atrial fibrillation with rapid ventricular response ([place]) 05/12/2020  . Hypertensive urgency 05/11/2020  . Chronic HFrEF (heart failure with reduced ejection fraction) ([place]) 05/11/2020  . Fluid overload 05/11/2020  . Acute on chronic systolic congestive heart failure ([place])   . End stage renal disease ([place]) 02/19/2020  .  Aortic arch atherosclerosis ([place]) 01/22/2020  . Hospital discharge follow-up 01/22/2020  . Urinary retention 01/22/2020  . Neurologic deficit as late effect of ischemic cerebrovascular accident (CVA) 01/15/2020  . Dissection of left subclavian artery ([place])   . Cerebrovascular accident (CVA) ([place]) 01/14/2020  . Constipation 05/13/2019  . Cardiomyopathy ([place]) 04/26/2019  . Coronary artery disease involving native coronary artery of native heart without angina pectoris 04/26/2019  . Chronic pain not due to malignancy 04/12/2019  . Chronic bilateral low back pain with bilateral sciatica  11/27/2018  . Synovial cyst of lumbar spine 11/27/2018  . Uncontrolled hypertension 11/14/2018  . CKD (chronic kidney disease), stage V ([place]) 11/14/2018  . Major depressive disorder with current active episode 07/26/2017  . Anemia of chronic kidney failure, stage 4 (severe) ([place]) 07/26/2017  . CKD (chronic kidney disease), stage IV ([place]) 11/25/2016  . DOE (dyspnea on exertion) 11/25/2016  . Type 2 diabetes mellitus with hyperlipidemia ([place]) 12/02/2014  . S/P UVPP (uvulopalatopharyngoplasty) 08/22/2014  . Presbyacusis 08/22/2014  . Adenocarcinoma, renal cell ([place]) 02/26/2014  . Renal cell carcinoma ([place]) 02/26/2014  . Malignant neoplasm of kidney excluding renal pelvis ([place]) 02/26/2014  . Peripheral arterial occlusive disease ([place]) 02/26/2014  . Elevated alkaline phosphatase measurement 07/30/2013  . Prostate CA ([place]) 07/30/2013  . Prostate cancer ([place]) 07/30/2013  . Elevated serum alkaline phosphatase level 07/30/2013  . Malignant neoplasm of prostate ([place]) 07/30/2013  . Hypertensive renal sclerosis with hypertension 10/18/2012  . Renal sclerosis with hypertension 10/18/2012  . Complication of diabetes mellitus ([place]) 07/16/2012  . PAD (peripheral artery disease) ([place])   . Left hip pain 04/20/2012  . Erectile dysfunction 04/20/2012  . Chronic kidney disease, stage III (moderate) ([place]) 04/20/2012  . Hip pain 04/20/2012  . TIA (transient ischemic attack) 01/15/2012  . Temporary cerebral vascular dysfunction 01/15/2012  . Tobacco abuse counseling 05/06/2011  . Counseling on substance use and abuse 05/06/2011  . Hyperlipidemia   . History of renal cell carcinoma     Past Surgical History:  Procedure Laterality Date  . CAPD INSERTION N/A 03/01/2019   Procedure: LAPAROSCOPIC INSERTION CONTINUOUS AMBULATORY PERITONEAL DIALYSIS  (CAPD) CATHETER;  Surgeon: Algernon Huxley, MD;  Location: [facility] ORS;  Service: Vascular;  Laterality: N/A;  . CARDIAC CATHETERIZATION     Dr. [name] did this to  assess his renal artery  . cyst removal  12/25/2015   Spine L4 and L5  . heminephrectomy  2004   for renal cell CA  . RENAL CRYOABLATION  Jan 2006   right kidney,  [name]  . sciatica      Prior to Admission medications   Medication Sig Start Date End Date Taking? Authorizing Provider  amLODipine (NORVASC) 10 MG tablet TAKE 1 TABLET BY MOUTH ONCE DAILY 06/09/20  Yes Crecencio Mc, MD  aspirin 81 MG tablet Take 1 tablet (81 mg total) by mouth daily. 05/16/14  Yes Crecencio Mc, MD  carvedilol (COREG) 25 MG tablet Take 25 mg by mouth 2 (two) times daily with a meal. 07/07/17  Yes [provider]  cloNIDine (CATAPRES) 0.2 MG tablet Take 0.2 mg by mouth 3 (three) times daily. 03/06/20  Yes [provider]  clopidogrel (PLAVIX) 75 MG tablet Take 1 tablet (75 mg total) by mouth daily. 04/02/20  Yes Theora Gianotti, NP  doxazosin (CARDURA) 8 MG tablet Take 8 mg by mouth daily. 03/29/20  Yes [provider]  ezetimibe (ZETIA) 10 MG tablet Take 1 tablet (10 mg total) by mouth  daily. 05/09/19 04/02/29 Yes Theora Gianotti, NP  fluticasone [facility]) 50 MCG/ACT nasal spray Place 2 sprays into both nostrils daily. 05/29/18  Yes Crecencio Mc, MD  multivitamin (RENA-VIT) TABS tablet Take 1 tablet by mouth at bedtime. 12/21/19  Yes [provider]  nicotine (NICODERM CQ - DOSED IN MG/24 HOURS) 14 mg/24hr patch Uses occassionally   Yes [provider]  rosuvastatin (CRESTOR) 20 MG tablet Take 1 tablet (20 mg total) by mouth at bedtime. 04/02/20 04/02/21 Yes Theora Gianotti, NP  albuterol (VENTOLIN HFA) 108 (90 Base) MCG/ACT inhaler Inhale 2 puffs into the lungs every 6 (six) hours as needed for wheezing or shortness of breath. 04/12/19   Crecencio Mc, MD  calcitRIOL (ROCALTROL) 0.25 MCG capsule Take 0.25 mcg by mouth every Monday, Wednesday, and Friday.    [provider]  diphenhydrAMINE (BENADRYL) 25 MG tablet Take 25 mg  by mouth every 6 (six) hours as needed for allergies.    [provider]  sodium bicarbonate 325 MG tablet Take 325 mg by mouth 2 (two) times daily. Patient not taking: Reported on 08/06/2020 02/14/18   [provider]    Allergies Irbesartan, Cymbalta [duloxetine hcl], Hydralazine, Imdur [isosorbide nitrate], Atorvastatin, and Bystolic [nebivolol hcl]  Family History  Problem Relation Age of Onset  . Hypertension Mother   . Cancer Mother        breast  . Aneurysm Mother   . Coronary artery disease Father   . Hypertension Father   . Stroke Father 51  . Heart disease Father   . Heart attack Father 75  . Aneurysm Maternal Grandmother        brain  . Aneurysm Paternal Grandmother        brain  . Coronary artery disease Paternal Grandfather   . Heart disease Brother        valvular heart disease  . COPD Brother   . Hypertension Brother   . Stroke Paternal Uncle     Social History Social History   Tobacco Use  . Smoking status: Current Every Day Smoker    Packs/day: 1.00    Years: 50.00    Pack years: 50.00    Types: Cigarettes  . Smokeless tobacco: Never Used  Vaping Use  . Vaping Use: Former  . Devices: tried but did not like  Substance Use Topics  . Alcohol use: Not Currently  . Drug use: No    Review of Systems  Review of Systems  Constitutional: Negative for chills, fatigue and fever.  HENT: Negative for sore throat.   Respiratory: Positive for cough and shortness of breath.   Cardiovascular: Positive for leg swelling. Negative for chest pain.  Gastrointestinal: Negative for abdominal pain.  Genitourinary: Negative for flank pain.  Musculoskeletal: Negative for neck pain.  Skin: Negative for rash and wound.  Allergic/Immunologic: Negative for immunocompromised state.  Neurological: Positive for weakness. Negative for numbness.  Hematological: Does not bruise/bleed easily.  All other systems reviewed and are negative.     ____________________________________________  PHYSICAL EXAM:      VITAL SIGNS: ED Triage Vitals  Enc Vitals Group     BP 08/06/20 1759 (!) 213/83     Pulse Rate 08/06/20 1759 (!) 57     Resp 08/06/20 1759 20     Temp 08/06/20 1759 97.7 F (36.5 C)     Temp Source 08/06/20 1759 Oral     SpO2 08/06/20 1759 100 %     Weight 08/06/20  1800 148 lb (67.1 kg)     Height 08/06/20 1800 5\' 6"  (1.676 m)     Head Circumference --      Peak Flow --      Pain Score 08/06/20 1759 0     Pain Loc --      Pain Edu? --      Excl. in [place]? --      Physical Exam Vitals and nursing note reviewed.  Constitutional:      General: He is not in acute distress.    Appearance: He is well-developed.  HENT:     Head: Normocephalic and atraumatic.  Eyes:     Conjunctiva/sclera: Conjunctivae normal.  Cardiovascular:     Rate and Rhythm: Normal rate and regular rhythm.     Heart sounds: Normal heart sounds. No murmur heard. No friction rub.  Pulmonary:     Effort: Pulmonary effort is normal. No respiratory distress.     Breath sounds: Examination of the right-lower field reveals rales. Examination of the left-lower field reveals rales. Rales present. No wheezing.  Abdominal:     General: There is no distension.     Palpations: Abdomen is soft.     Tenderness: There is no abdominal tenderness.  Musculoskeletal:     Cervical back: Neck supple.     Right lower leg: Edema (2+ ) present.     Left lower leg: Edema (2+) present.  Skin:    General: Skin is warm.     Capillary Refill: Capillary refill takes less than 2 seconds.  Neurological:     Mental Status: He is alert and oriented to person, place, and time.     Motor: No abnormal muscle tone.       ____________________________________________   LABS (all labs ordered are listed, but only abnormal results are displayed)  Labs Reviewed  BASIC METABOLIC PANEL - Abnormal; Notable for the following components:      Result Value   Glucose,  Bld 132 (*)    BUN 41 (*)    Creatinine, Ser 6.51 (*)    Calcium 8.3 (*)    GFR, Estimated 8 (*)    All other components within normal limits  CBC - Abnormal; Notable for the following components:   RBC 3.38 (*)    Hemoglobin 10.7 (*)    HCT 31.6 (*)    nRBC 0.3 (*)    All other components within normal limits  BRAIN NATRIURETIC PEPTIDE - Abnormal; Notable for the following components:   B Natriuretic Peptide 1,785.3 (*)    All other components within normal limits  RESP PANEL BY RT-PCR (FLU A&B, COVID) ARPGX2  MAGNESIUM  BASIC METABOLIC PANEL  HEMOGLOBIN A1C  TROPONIN I (HIGH SENSITIVITY)  TROPONIN I (HIGH SENSITIVITY)    ____________________________________________  EKG: Sinus bradycardia, VR 54. PR 168, QRS 102, QTc 479. Pulm disease pattern. Incomplete LBBB. LVH. TWI noted in inferolateral leads - seen previously though slightly worsened. ________________________________________  RADIOLOGY All imaging, including plain films, CT scans, and ultrasounds, independently reviewed by me, and interpretations confirmed via formal radiology reads.  ED MD interpretation:   CXR: Small L pleural effusion  Official radiology report(s): DG Chest 2 View  Result Date: 08/06/2020 CLINICAL DATA:  Shortness of breath. EXAM: CHEST - 2 VIEW COMPARISON:  May 11, 2020 FINDINGS: The lungs are hyperinflated. Chronic appearing increased lung markings are seen. Mild blunting of the left costophrenic angle is noted. There is no evidence of a pneumothorax. The cardiac silhouette is  mildly enlarged. Moderate severity calcification of the aortic arch is seen. The visualized skeletal structures are unremarkable. IMPRESSION: Stable cardiomegaly with a very small left pleural effusion versus pleural thickening. Electronically Signed   By: Virgina Norfolk M.D.   On: 08/06/2020 18:35    ____________________________________________  PROCEDURES   Procedure(s) performed (including Critical  Care):  .1-3 Lead EKG Interpretation Performed by: Duffy Bruce, MD Authorized by: Duffy Bruce, MD     Interpretation: normal     ECG rate:  50-70   ECG rate assessment: bradycardic     Rhythm: sinus bradycardia     Ectopy: none     Conduction: normal   Comments:     Indication: SOB    ____________________________________________  INITIAL IMPRESSION / MDM / ASSESSMENT AND PLAN / ED COURSE  As part of my medical decision making, I reviewed the following data within the [facility] notes reviewed and incorporated, Old chart reviewed, Notes from prior ED visits, and [place] Controlled Substance Database       *JAZIR NEWEY was evaluated in Emergency Department on 08/06/2020 for the symptoms described in the history of present illness. He was evaluated in the context of the global COVID-19 pandemic, which necessitated consideration that the patient might be at risk for infection with the SARS-CoV-2 virus that causes COVID-19. Institutional protocols and algorithms that pertain to the evaluation of patients at risk for COVID-19 are in a state of rapid change based on information released by regulatory bodies including the CDC and federal and state organizations. These policies and algorithms were followed during the patient's care in the ED.  Some ED evaluations and interventions may be delayed as a result of limited staffing during the pandemic.*     Medical Decision Making:  75 yo M here with SOB, leg swelling. Suspect hypervolemia in setting of possible nonadherence/misuse of his PD. He has b/l rales, edema on exam and is significantly hypertensive. CXR reviewed and is clear, however. Lytes acceptable for his ESRD status. BNP markedly elevated c/w hypervolemia. EKG shows some TWI in lateral precordial leads, slightly worse than prior though trop neg, no CP to suggest ACS. Discussed with Dr. Candiss Norse. Will start on IV Lasix 80 mg IV BID, admit for PD and work-up.  Hydral for BP.  ____________________________________________  FINAL CLINICAL IMPRESSION(S) / ED DIAGNOSES  Final diagnoses:  ESRD (end stage renal disease) ([place])  Other hypervolemia     MEDICATIONS GIVEN DURING THIS VISIT:  Medications  carvedilol (COREG) tablet 25 mg (has no administration in time range)  sodium chloride flush (NS) 0.9 % injection 3 mL (has no administration in time range)  sodium chloride flush (NS) 0.9 % injection 3 mL (has no administration in time range)  0.9 %  sodium chloride infusion (has no administration in time range)  acetaminophen (TYLENOL) tablet 650 mg (has no administration in time range)  ondansetron (ZOFRAN) injection 4 mg (has no administration in time range)  heparin injection 5,000 Units (has no administration in time range)  furosemide (LASIX) injection 60 mg (has no administration in time range)  insulin aspart (novoLOG) injection 0-9 Units (has no administration in time range)  insulin aspart (novoLOG) injection 0-5 Units (has no administration in time range)  heparin 1000 unit/ml injection 500 Units (has no administration in time range)  gentamicin cream (GARAMYCIN) 0.1 % 1 application (has no administration in time range)  dialysis solution 2.5% low-MG/low-CA dianeal solution (has no administration in time range)  ED Discharge Orders    None       Note:  This document was prepared using Dragon voice recognition software and may include unintentional dictation errors.   Duffy Bruce, MD 08/06/20 2110

## 2020-08-06 NOTE — Plan of Care (Signed)

## 2020-08-06 NOTE — ED Triage Notes (Signed)
Pt states that he does peritoneal dialysis and reports for the past week having increase in sob, states that he spoke with his kidney dr who said it could be that the peritoneal fluid could be getting into his lungs. Pt states that he stopped used the machine and started doing it manually but reports he cont to have sob.

## 2020-08-06 NOTE — Telephone Encounter (Addendum)
Spoke with the patients wife. Patients wife sts that the patient has had SBP in the 200's for the last 2 days. Yesterdays BP 205/86 BP this morning 208/78 58bpm He denies stroke like symptoms, no headache, numbness, or change in vision. Patient reports increased DOE, orthopnea ,and LE edema.. He has not been able to sleep through the night. He is suppose to sleep with a wedge, wife sts that he does not.  Patient has CKD and is suppose to do 3 peritoneal dialysis treatments daily at home. Patient wife sts that the patient has been non-compliant a usually does it just one a day.   Adv the pt wife that management of the patients hypertension has been deferred to his nephrologist Dr. Juleen China due to the patients kidney disease. We are limited on what anti-hypertensive medications can be described. Recommend that she contact Dr. Assunta Gambles office asap to provide an update on the patients status. Adv I will also update Dr. Saunders Revel and call back with his recommendation.  Per Dr. Saunders Revel. The pt wife needs to contact Dr. Assunta Gambles office for management of the patients BP and dialysis. There is a low threshold for the patient to be seen in the ED given his prolonged elevated BP and increased DOE. Patients wife made aware of Dr. Marisue Humble' recommendation.  Pt wife sts that she will contact Dr. Assunta Gambles office and then take the [facility] to [facility] ED for evaluation. Pt wife voiced appreciation for the assistance.

## 2020-08-06 NOTE — H&P (Signed)
History and Physical    [name]:[id number] DOB: 10/30/1945 DOA: 08/06/2020  PCP: Crecencio Mc, MD   Patient coming from: Home  I have personally briefly reviewed patient's old medical records in [place]  Chief Complaint: Shortness of breath on exertion  HPI: [name] is a 75 y.o. male with medical history significant for CAD, systolic heart failure, last EF 40 to 45% December 2021, ESRD on PD, hypertension, peripheral arterial disease, sleep apnea, renal cell carcinoma status post left heminephrectomy,history of CVA who presents to the emergency room with a 2-week history of progressively worsening shortness of breath, now associated with orthopnea, lower extremity edema and weight gain of several pounds in the last few days.  He denies chest pain, denies cough fever or chills.  Patient states he is compliant with dialysis.  His systolic blood pressure at home has been consistently in the 200s.  Of note, patient was evaluated by his cardiologist, Dr. Saunders Revel on 07/16/2020 with a complaint of exertional dyspnea and possibility of card cath was discussed.   ED Course: On arrival, BP 213/83, pulse 57, temp 97.7, O2 sat 100% on room air.  Blood work significant mainly for BNP of 1785.  Troponin of 14.  Hemoglobin 10.7 was at baseline.  BMP without hyperkalemia or acidosis. EKG as reviewed by me : Sinus bradycardia at 54 with no acute ST-T wave changes Imaging: Stable cardiomegaly with a very small left pleural effusion versus pleural thickening  Review of Systems: As per HPI otherwise all other systems on review of systems negative.    Past Medical History:  Diagnosis Date  . 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 Cardiomyopathy ([place])    a. 12/2018 Echo: EF 40-45%, global HK. Asc Ao 3.7cm; b. 01/2019 [place]i MV: small, mild, fixed basal and mid antlat defect - scar vs artifact. Small, mild mid and apical inf minimally reversible  defect, likely scar w/ peri-infarct ischemia. Coronary and Ao atherosclerosis; c. 12/2019 Echo: EF 40-45%, glob HK, Gr1 DD. Nl RV fxn. Sev dil LA. Mild MR. Mild-mod Ao sclerosis w/o stenosis. Asc Ao 25mm.  . Carotid arterial disease ([place])    a. 01/2020 RICA 1-39%, RCCA/RECA < 37%, LICA 6-28%, LCCA/LECA <50%.  . Claudication ([place])    a. 02/2020 ABI/TBI: R 1.08/0.95, L 1.00/0.70.  Marland Kitchen Complication of anesthesia    had to be woken up slowly as his bp was elevated when did this quickly  . Diabetes mellitus without complication ([place])   . ESRD (end stage renal disease) ([place])    a. Peritoneal Dialysis pt.  . Hyperlipidemia   . Hypertension   . Migraine    cluster  . Neuromuscular disorder ([place])    left lower extrem neuropathy  . Obstructive sleep apnea    no OSA since had facial surgery with dr. Kathyrn Sheriff in 1997  . PAD (peripheral artery disease) [facility]) Feb 2009   nonobstructing, renal angiogram ([place])  . Renal cell carcinoma 2004   left kidney heminephrectomy  . Renal insufficiency   . Stroke [facility])    a. 12/2019 MRI: Small acute infarcts involving the left cerebral hemisphere and several chronic infarcts.  Marland Kitchen TIA (transient ischemic attack)    no residual but left leg and foot still feel heavy  . tobacco abuse     Past Surgical History:  Procedure Laterality Date  . CAPD INSERTION N/A 03/01/2019   Procedure: LAPAROSCOPIC INSERTION CONTINUOUS AMBULATORY PERITONEAL DIALYSIS  (  CAPD) CATHETER;  Surgeon: Algernon Huxley, MD;  Location: [facility] ORS;  Service: Vascular;  Laterality: N/A;  . CARDIAC CATHETERIZATION     Dr. [name] did this to assess his renal artery  . cyst removal  12/25/2015   Spine L4 and L5  . heminephrectomy  2004   for renal cell CA  . RENAL CRYOABLATION  Jan 2006   right kidney,  [name]  . sciatica       reports that he has been smoking cigarettes. He has a 50.00 pack-year smoking history. He has never used smokeless tobacco. He reports previous alcohol use. He reports that he  does not use drugs.  Allergies  Allergen Reactions  . Irbesartan Other (See Comments)    hyperkalemia  . Cymbalta [Duloxetine Hcl]   . Hydralazine Other (See Comments)    headache  . Imdur [Isosorbide Nitrate] Other (See Comments)    headache  . Atorvastatin Other (See Comments)    Muscle pain  . Bystolic [Nebivolol Hcl]     Extreme fatigue     Family History  Problem Relation Age of Onset  . Hypertension Mother   . Cancer Mother        breast  . Aneurysm Mother   . Coronary artery disease Father   . Hypertension Father   . Stroke Father 25  . Heart disease Father   . Heart attack Father 37  . Aneurysm Maternal Grandmother        brain  . Aneurysm Paternal Grandmother        brain  . Coronary artery disease Paternal Grandfather   . Heart disease Brother        valvular heart disease  . COPD Brother   . Hypertension Brother   . Stroke Paternal Uncle       Prior to Admission medications   Medication Sig Start Date End Date Taking? Authorizing Provider  albuterol (VENTOLIN HFA) 108 (90 Base) MCG/ACT inhaler Inhale 2 puffs into the lungs every 6 (six) hours as needed for wheezing or shortness of breath. 04/12/19   Crecencio Mc, MD  amLODipine (NORVASC) 10 MG tablet TAKE 1 TABLET BY MOUTH ONCE DAILY 06/09/20   Crecencio Mc, MD  aspirin 81 MG tablet Take 1 tablet (81 mg total) by mouth daily. 05/16/14   Crecencio Mc, MD  calcitRIOL (ROCALTROL) 0.25 MCG capsule Take 0.25 mcg by mouth every Monday, Wednesday, and Friday.    [provider]  carvedilol (COREG) 25 MG tablet Take 25 mg by mouth 2 (two) times daily with a meal. 07/07/17   [provider]  cloNIDine (CATAPRES) 0.2 MG tablet Take 0.2 mg by mouth 3 (three) times daily. 03/06/20   [provider]  clopidogrel (PLAVIX) 75 MG tablet Take 1 tablet (75 mg total) by mouth daily. 04/02/20   Theora Gianotti, NP  diphenhydrAMINE (BENADRYL) 25 MG tablet Take 25 mg by mouth every 6  (six) hours as needed for allergies.    [provider]  doxazosin (CARDURA) 8 MG tablet Take 8 mg by mouth daily. 03/29/20   [provider]  ezetimibe (ZETIA) 10 MG tablet Take 1 tablet (10 mg total) by mouth daily. 05/09/19 04/02/29  Theora Gianotti, NP  fluticasone (FLONASE) 50 MCG/ACT nasal spray Place 2 sprays into both nostrils daily. 05/29/18   Crecencio Mc, MD  gentamicin cream (GARAMYCIN) 0.1 % Apply 1 application topically daily. 05/14/20   Loletha Grayer, MD  multivitamin (RENA-VIT) TABS tablet  Take 1 tablet by mouth at bedtime. 12/21/19   [provider]  nicotine (NICODERM CQ - DOSED IN MG/24 HOURS) 14 mg/24hr patch Uses occassionally    [provider]  rosuvastatin (CRESTOR) 20 MG tablet Take 1 tablet (20 mg total) by mouth at bedtime. 04/02/20 04/02/21  Theora Gianotti, NP  sodium bicarbonate 325 MG tablet Take 325 mg by mouth 2 (two) times daily. 02/14/18   [provider]  sodium chloride (OCEAN) 0.65 % SOLN nasal spray Place 1 spray into both nostrils as needed for congestion. 05/14/20   Loletha Grayer, MD    Physical Exam: Vitals:   08/06/20 1800 08/06/20 1900 08/06/20 1936 08/06/20 2000  BP:  (!) 201/83 (!) 214/73 (!) 201/78  Pulse:   (!) 51 (!) 51  Resp:   20 17  Temp:      TempSrc:      SpO2:   100% 98%  Weight: 67.1 kg     Height: 5\' 6"  (1.676 m)        Vitals:   08/06/20 1800 08/06/20 1900 08/06/20 1936 08/06/20 2000  BP:  (!) 201/83 (!) 214/73 (!) 201/78  Pulse:   (!) 51 (!) 51  Resp:   20 17  Temp:      TempSrc:      SpO2:   100% 98%  Weight: 67.1 kg     Height: 5\' 6"  (1.676 m)         Constitutional: Alert and oriented x 3 . Not in any apparent distress HEENT:      Head: Normocephalic and atraumatic.         Eyes: PERLA, EOMI, Conjunctivae are normal. Sclera is non-icteric.       Mouth/Throat: Mucous membranes are moist.       Neck: Supple with no signs of  meningismus. Cardiovascular: Regular rate and rhythm. No murmurs, gallops, or rubs. 2+ symmetrical distal pulses are present . No JVD. No 2+LE edema Respiratory: Respiratory effort mildly increased .Lungs sounds with bibasilar rales  gastrointestinal: Soft, non tender, and non distended with positive bowel sounds.  Genitourinary: No CVA tenderness. Musculoskeletal: Nontender with normal range of motion in all extremities. No cyanosis, or erythema of extremities. Neurologic:  Face is symmetric. Moving all extremities. No gross focal neurologic deficits . Skin: Skin is warm, dry.  No rash or ulcers Psychiatric: Mood and affect are normal    Labs on Admission: I have personally reviewed following labs and imaging studies  CBC: Recent Labs  Lab 08/06/20 1804  WBC 10.3  HGB 10.7*  HCT 31.6*  MCV 93.5  PLT 546   Basic Metabolic Panel: Recent Labs  Lab 08/06/20 1804 08/06/20 1929  NA 140  --   K 4.5  --   CL 106  --   CO2 25  --   GLUCOSE 132*  --   BUN 41*  --   CREATININE 6.51*  --   CALCIUM 8.3*  --   MG  --  2.2   GFR: Estimated Creatinine Clearance: 9 mL/min (A) (by C-G formula based on SCr of 6.51 mg/dL (H)). Liver Function Tests: No results for input(s): AST, ALT, ALKPHOS, BILITOT, PROT, ALBUMIN in the last 168 hours. No results for input(s): LIPASE, AMYLASE in the last 168 hours. No results for input(s): AMMONIA in the last 168 hours. Coagulation Profile: No results for input(s): INR, PROTIME in the last 168 hours. Cardiac Enzymes: No results for input(s): CKTOTAL, CKMB, CKMBINDEX, TROPONINI in the last  168 hours. BNP (last 3 results) No results for input(s): PROBNP in the last 8760 hours. HbA1C: No results for input(s): HGBA1C in the last 72 hours. CBG: No results for input(s): GLUCAP in the last 168 hours. Lipid Profile: No results for input(s): CHOL, HDL, LDLCALC, TRIG, CHOLHDL, LDLDIRECT in the last 72 hours. Thyroid Function Tests: No results for  input(s): TSH, T4TOTAL, FREET4, T3FREE, THYROIDAB in the last 72 hours. Anemia Panel: No results for input(s): VITAMINB12, FOLATE, FERRITIN, TIBC, IRON, RETICCTPCT in the last 72 hours. Urine analysis:    Component Value Date/Time   COLORURINE YELLOW 01/21/2020 [place] 01/21/2020 1223   APPEARANCEUR Clear 01/03/2012 1117   LABSPEC 1.010 01/21/2020 1223   LABSPEC 1.004 01/03/2012 1117   PHURINE 7.0 01/21/2020 1223   GLUCOSEU 100 (A) 01/21/2020 1223   HGBUR TRACE-INTACT (A) 01/21/2020 1223   BILIRUBINUR NEGATIVE 01/21/2020 1223   BILIRUBINUR neg 07/30/2013 1127   BILIRUBINUR Negative 01/03/2012 [place] 01/21/2020 1223   PROTEINUR 100 07/30/2013 1127   PROTEINUR Negative 01/03/2012 1117   PROTEINUR 30 (A) 06/30/2011 0822   UROBILINOGEN 0.2 01/21/2020 1223   NITRITE NEGATIVE 01/21/2020 1223   LEUKOCYTESUR NEGATIVE 01/21/2020 1223   LEUKOCYTESUR Negative 01/03/2012 1117    Radiological Exams on Admission: DG Chest 2 View  Result Date: 08/06/2020 CLINICAL DATA:  Shortness of breath. EXAM: CHEST - 2 VIEW COMPARISON:  May 11, 2020 FINDINGS: The lungs are hyperinflated. Chronic appearing increased lung markings are seen. Mild blunting of the left costophrenic angle is noted. There is no evidence of a pneumothorax. The cardiac silhouette is mildly enlarged. Moderate severity calcification of the aortic arch is seen. The visualized skeletal structures are unremarkable. IMPRESSION: Stable cardiomegaly with a very small left pleural effusion versus pleural thickening. Electronically Signed   By: Virgina Norfolk M.D.   On: 08/06/2020 18:35     Assessment/Plan 75 year old male with history of CAD, systolic heart failure, last EF 40 to 45% December 2021, ESRD on PD presenting with a 2-week history of progressively worsening shortness of breath, orthopnea, lower extremity edema and weight gain of several pounds in the last few days.        Acute on  chronic systolic congestive heart failure (HCC)   Fluid overload   ESRD on peritoneal dialysis [facility])   Hypertensive urgency -Patient with progressive dyspnea, orthopnea and lower extremity edema, possibly related to insufficient fluid removal at dialysis versus cardiac etiology related to uncontrolled hypertension -BP 213/83, BNP 1785 with chest x-ray showing small left pleural effusion -Lasix 60 mg IV twice daily -BP control -Daily weights with intake and output monitoring -Patient had echocardiogram 04/2020 with EF 40 to 45% and grade 1 diastolic dysfunction -Nephrology consult for management of dialysis -Cardiology consult.  Patient last saw Dr. Saunders Revel on 2/23 with similar symptoms and possibility of    Coronary artery disease involving native coronary artery of native heart without angina pectoris -Troponin normal at 14 and patient denies chest pain, EKG nonacute -Continue aspirin, clopidogrel and rosuvastatin as well as carvedilol -Cardiology consult given recent cardiology visit with discussion of possible ischemic heart disease as etiology of his chronic dyspnea    Anemia of chronic disease -Hemoglobin at baseline  History of CVA without residual deficit History of PAD -Continue antiplatelets and statins  DVT prophylaxis: Heparin Code Status: full code  Family Communication:  none  Disposition Plan: Back to previous home environment Consults called: Nephrology, cardiology Status:At the time of admission, it  appears that the appropriate admission status for this patient is INPATIENT. This is judged to be reasonable and necessary in order to provide the required intensity of service to ensure the patient's safety given the presenting symptoms, physical exam findings, and initial radiographic and laboratory data in the context of their  Comorbid conditions.   Patient requires inpatient status due to high intensity of service, high risk for further deterioration and high frequency of  surveillance required.   I certify that at the point of admission it is my clinical judgment that the patient will require inpatient hospital care spanning beyond [place] MD [name]     08/06/2020, 8:31 PM

## 2020-08-06 NOTE — Telephone Encounter (Signed)
Pt c/o BP issue: STAT if pt c/o blurred vision, one-sided weakness or slurred speech  1. What are your last 5 BP readings?  Monday 205/86 This morning 208/78-after medication HR 58   2. Are you having any other symptoms (ex. Dizziness, headache, blurred vision, passed out)? States since Monday is having difficulty breathing, unable to sleep, this morning "looks horrible", ankles swollen  3. What is your BP issue? elevated

## 2020-08-07 ENCOUNTER — Inpatient Hospital Stay: Payer: Medicare Other

## 2020-08-07 DIAGNOSIS — C61 Malignant neoplasm of prostate: Secondary | ICD-10-CM

## 2020-08-07 DIAGNOSIS — D638 Anemia in other chronic diseases classified elsewhere: Secondary | ICD-10-CM

## 2020-08-07 DIAGNOSIS — Z992 Dependence on renal dialysis: Secondary | ICD-10-CM

## 2020-08-07 DIAGNOSIS — N186 End stage renal disease: Secondary | ICD-10-CM

## 2020-08-07 DIAGNOSIS — I5023 Acute on chronic systolic (congestive) heart failure: Secondary | ICD-10-CM

## 2020-08-07 DIAGNOSIS — I2510 Atherosclerotic heart disease of native coronary artery without angina pectoris: Secondary | ICD-10-CM

## 2020-08-07 DIAGNOSIS — I160 Hypertensive urgency: Secondary | ICD-10-CM

## 2020-08-07 DIAGNOSIS — R0602 Shortness of breath: Secondary | ICD-10-CM

## 2020-08-07 LAB — BASIC METABOLIC PANEL
Anion gap: 9 (ref 5–15)
BUN: 43 mg/dL — ABNORMAL HIGH (ref 8–23)
CO2: 23 mmol/L (ref 22–32)
Calcium: 8.2 mg/dL — ABNORMAL LOW (ref 8.9–10.3)
Chloride: 108 mmol/L (ref 98–111)
Creatinine, Ser: 6.7 mg/dL — ABNORMAL HIGH (ref 0.61–1.24)
GFR, Estimated: 8 mL/min — ABNORMAL LOW (ref >60)
Glucose, Bld: 92 mg/dL (ref 70–99)
Potassium: 3.7 mmol/L (ref 3.5–5.1)
Sodium: 140 mmol/L (ref 135–145)

## 2020-08-07 LAB — GLUCOSE, CAPILLARY
Glucose-Capillary: 98 mg/dL (ref 70–99)
Glucose-Capillary: 146 mg/dL — ABNORMAL HIGH (ref 70–99)
Glucose-Capillary: 55 mg/dL — ABNORMAL LOW (ref 70–99)
Glucose-Capillary: 100 mg/dL — ABNORMAL HIGH (ref 70–99)
Glucose-Capillary: 191 mg/dL — ABNORMAL HIGH (ref 70–99)

## 2020-08-07 LAB — HEMOGLOBIN A1C
Hgb A1c MFr Bld: 6.3 % — ABNORMAL HIGH (ref 4.8–5.6)
Mean Plasma Glucose: 134.11 mg/dL

## 2020-08-07 MED ORDER — EZETIMIBE 10 MG PO TABS
10.0000 mg | ORAL_TABLET | Freq: Every day | ORAL | Status: DC
  Administered 2020-08-07 – 2020-08-09 (×3): 10 mg via ORAL
  Filled 2020-08-07 (×3): qty 1

## 2020-08-07 MED ORDER — DIPHENHYDRAMINE HCL 25 MG PO CAPS: 25.0000 mg | ORAL_CAPSULE | Freq: Four times a day (QID) | ORAL | Status: DC | PRN

## 2020-08-07 MED ORDER — AMLODIPINE BESYLATE 10 MG PO TABS: 10.0000 mg | ORAL_TABLET | Freq: Every evening | ORAL | Status: DC

## 2020-08-07 MED ORDER — LABETALOL HCL 5 MG/ML IV SOLN
10.0000 mg | Freq: Once | INTRAVENOUS | Status: AC
  Administered 2020-08-07: 10 mg via INTRAVENOUS
  Filled 2020-08-07: qty 4

## 2020-08-07 MED ORDER — CLOPIDOGREL BISULFATE 75 MG PO TABS
75.0000 mg | ORAL_TABLET | Freq: Every day | ORAL | Status: DC
  Administered 2020-08-07 – 2020-08-09 (×3): 75 mg via ORAL
  Filled 2020-08-07 (×3): qty 1

## 2020-08-07 MED ORDER — AMLODIPINE BESYLATE 10 MG PO TABS
10.0000 mg | ORAL_TABLET | Freq: Every evening | ORAL | Status: DC
  Administered 2020-08-07 – 2020-08-08 (×2): 10 mg via ORAL
  Filled 2020-08-07 (×2): qty 1

## 2020-08-07 MED ORDER — IRBESARTAN 150 MG PO TABS
150.0000 mg | ORAL_TABLET | Freq: Every day | ORAL | Status: DC
  Administered 2020-08-07 – 2020-08-08 (×2): 150 mg via ORAL
  Filled 2020-08-07 (×2): qty 1

## 2020-08-07 MED ORDER — ASPIRIN EC 81 MG PO TBEC
81.0000 mg | DELAYED_RELEASE_TABLET | Freq: Every day | ORAL | Status: DC
  Administered 2020-08-07 – 2020-08-09 (×3): 81 mg via ORAL
  Filled 2020-08-07 (×3): qty 1

## 2020-08-07 MED ORDER — HYDRALAZINE HCL 20 MG/ML IJ SOLN
10.0000 mg | Freq: Once | INTRAMUSCULAR | Status: DC
  Filled 2020-08-07: qty 1

## 2020-08-07 MED ORDER — FUROSEMIDE 10 MG/ML IJ SOLN
120.0000 mg | INTRAVENOUS | Status: AC
  Administered 2020-08-07: 120 mg via INTRAVENOUS
  Filled 2020-08-07: qty 12

## 2020-08-07 MED ORDER — ROSUVASTATIN CALCIUM 10 MG PO TABS
20.0000 mg | ORAL_TABLET | Freq: Every day | ORAL | Status: DC
  Administered 2020-08-07 – 2020-08-08 (×2): 20 mg via ORAL
  Filled 2020-08-07 (×2): qty 2

## 2020-08-07 MED ORDER — CLONIDINE HCL 0.1 MG PO TABS
0.2000 mg | ORAL_TABLET | Freq: Three times a day (TID) | ORAL | Status: DC
  Administered 2020-08-07 – 2020-08-09 (×7): 0.2 mg via ORAL
  Filled 2020-08-07 (×7): qty 2

## 2020-08-07 MED ORDER — DOXAZOSIN MESYLATE 4 MG PO TABS
4.0000 mg | ORAL_TABLET | Freq: Two times a day (BID) | ORAL | Status: DC
  Administered 2020-08-07 – 2020-08-09 (×5): 4 mg via ORAL
  Filled 2020-08-07 (×6): qty 1

## 2020-08-07 NOTE — Progress Notes (Signed)
   08/07/20 0358  Assess: MEWS Score  Temp 97.7 F (36.5 C)  BP (!) 210/82  Pulse Rate 66  Resp 18  SpO2 98 %  O2 Device Nasal Cannula  O2 Flow Rate (L/min) 2 L/min  Assess: MEWS Score  MEWS Temp 0  MEWS Systolic 2  MEWS Pulse 0  MEWS RR 0  MEWS LOC 0  MEWS Score 2  MEWS Score Color Yellow  Assess: if the MEWS score is Yellow or Red  Were vital signs taken at a resting state? Yes  Focused Assessment No change from prior assessment  Early Detection of Sepsis Score *See Row Information* Low  MEWS guidelines implemented *See Row Information* Yes  Treat  MEWS Interventions Escalated (See documentation below)  Take Vital Signs  Increase Vital Sign Frequency  Yellow: Q 2hr X 2 then Q 4hr X 2, if remains yellow, continue Q 4hrs  Escalate  MEWS: Escalate Yellow: discuss with charge nurse/RN and consider discussing with provider and RRT  Notify: Charge Nurse/RN  Name of Charge Nurse/RN Notified Hinton Lovely RN  Date Charge Nurse/RN Notified 08/07/20  Time Charge Nurse/RN Notified 0400  Notify: Provider  Provider Name/Title Rufina Falco NP  Date Provider Notified 08/07/20  Time Provider Notified [phone]  Notification Type Page  Notification Reason Change in status (BP 210/82 Manual)  Provider response See new orders (1x dose 10mg  IVP Hydralazine)  Date of Provider Response 08/07/20  Time of Provider Response 0435  Document  Progress note created (see row info) Yes

## 2020-08-07 NOTE — Plan of Care (Signed)
  Problem: Education: Goal: Knowledge of General Education information will improve Description: Including pain rating scale, medication(s)/side effects and non-pharmacologic comfort measures 08/07/2020 1717 by Cristela Blue, RN Outcome: Progressing 08/07/2020 1717 by Cristela Blue, RN Outcome: Progressing   Problem: Health Behavior/Discharge Planning: Goal: Ability to manage health-related needs will improve 08/07/2020 1717 by Cristela Blue, RN Outcome: Progressing 08/07/2020 1717 by Cristela Blue, RN Outcome: Progressing   Problem: Clinical Measurements: Goal: Ability to maintain clinical measurements within normal limits will improve 08/07/2020 1717 by Cristela Blue, RN Outcome: Progressing 08/07/2020 1717 by Cristela Blue, RN Outcome: Progressing Goal: Will remain free from infection 08/07/2020 1717 by Cristela Blue, RN Outcome: Progressing 08/07/2020 1717 by Cristela Blue, RN Outcome: Progressing Goal: Diagnostic test results will improve 08/07/2020 1717 by Cristela Blue, RN Outcome: Progressing 08/07/2020 1717 by Cristela Blue, RN Outcome: Progressing Goal: Respiratory complications will improve 08/07/2020 1717 by Cristela Blue, RN Outcome: Progressing 08/07/2020 1717 by Cristela Blue, RN Outcome: Progressing Goal: Cardiovascular complication will be avoided 08/07/2020 1717 by Cristela Blue, RN Outcome: Progressing 08/07/2020 1717 by Cristela Blue, RN Outcome: Progressing   Problem: Activity: Goal: Risk for activity intolerance will decrease 08/07/2020 1717 by Cristela Blue, RN Outcome: Progressing 08/07/2020 1717 by Cristela Blue, RN Outcome: Progressing   Problem: Nutrition: Goal: Adequate nutrition will be maintained 08/07/2020 1717 by Cristela Blue, RN Outcome: Progressing 08/07/2020 1717 by Cristela Blue, RN Outcome: Progressing   Problem: Coping: Goal: Level of anxiety will decrease 08/07/2020 1717 by Cristela Blue, RN Outcome:  Progressing 08/07/2020 1717 by Cristela Blue, RN Outcome: Progressing   Problem: Elimination: Goal: Will not experience complications related to bowel motility 08/07/2020 1717 by Cristela Blue, RN Outcome: Progressing 08/07/2020 1717 by Cristela Blue, RN Outcome: Progressing Goal: Will not experience complications related to urinary retention 08/07/2020 1717 by Cristela Blue, RN Outcome: Progressing 08/07/2020 1717 by Cristela Blue, RN Outcome: Progressing   Problem: Pain Managment: Goal: General experience of comfort will improve 08/07/2020 1717 by Cristela Blue, RN Outcome: Progressing 08/07/2020 1717 by Cristela Blue, RN Outcome: Progressing   Problem: Safety: Goal: Ability to remain free from injury will improve 08/07/2020 1717 by Cristela Blue, RN Outcome: Progressing 08/07/2020 1717 by Cristela Blue, RN Outcome: Progressing   Problem: Skin Integrity: Goal: Risk for impaired skin integrity will decrease 08/07/2020 1717 by Cristela Blue, RN Outcome: Progressing 08/07/2020 1717 by Cristela Blue, RN Outcome: Progressing   Problem: Education: Goal: Ability to demonstrate management of disease process will improve 08/07/2020 1717 by Cristela Blue, RN Outcome: Progressing 08/07/2020 1717 by Cristela Blue, RN Outcome: Progressing Goal: Ability to verbalize understanding of medication therapies will improve 08/07/2020 1717 by Cristela Blue, RN Outcome: Progressing 08/07/2020 1717 by Cristela Blue, RN Outcome: Progressing Goal: Individualized Educational Video(s) 08/07/2020 1717 by Cristela Blue, RN Outcome: Progressing 08/07/2020 1717 by Cristela Blue, RN Outcome: Progressing   Problem: Activity: Goal: Capacity to carry out activities will improve 08/07/2020 1717 by Cristela Blue, RN Outcome: Progressing 08/07/2020 1717 by Cristela Blue, RN Outcome: Progressing   Problem: Cardiac: Goal: Ability to achieve and maintain adequate cardiopulmonary perfusion will  improve 08/07/2020 1717 by Cristela Blue, RN Outcome: Progressing 08/07/2020 1717 by Cristela Blue, RN Outcome: Progressing

## 2020-08-07 NOTE — Consult Note (Signed)
Cardiology Consultation:   Patient ID: [name]; [id number]; Jun 20, 1945   Admit date: 08/06/2020 Date of Consult: 08/07/2020  Primary Care Provider: Crecencio Mc, MD Primary Cardiologist: End Primary Electrophysiologist:  None   Patient Profile:   [name] is a 75 y.o. male with a hx of HFrEF uncertain etiology to date, ESRD on PD, CVA, DM2, HTN, HLD, LVH, mildly dilated aortic root, PAD, tobacco use, RCC status post left heminephrectomy, and adenocarcinoma of the appendix who is being seen today for the evaluation of cardiomyopathy at the request of Dr. Damita Dunnings.  History of Present Illness:   [name] underwent echo in 12/2018 which showed an EF of 40 to 45%, mild to moderately increased LV wall thickness, diffuse LV hypokinesis, normal RV systolic function and ventricular cavity size, mildly to moderately dilated left atrium, mild aortic insufficiency, mild to moderate mitral regurgitation, dilated aortic root measuring 3.7 cm, and dilated ascending aorta measuring 3.7 cm.  He was admitted to the hospital 12/2019 with a short dissection of the proximal left subclavian artery, which was determined to be nonsurgical by vascular surgery, when he presented with lack of coordination and multiple small infarcts involving the left cerebral hemisphere.  Echo at that time showed an EF of 40 to 45%, global hypokinesis, grade 1 diastolic dysfunction, normal RV systolic function and ventricular cavity size, severely dilated left atrium, and mild mitral regurgitation.  Subsequent event monitoring did not show evidence of A. fib.  In 04/2020, he was admitted for acute on chronic HFrEF with hypertensive urgency.  There was question of PAF when EMS first arrived on scene though this was unable to be confirmed by tracings.  In this setting the decision was made to defer anticoagulation.  Echo during that admission showed a stable cardiomyopathy with an EF of 40 to 45%, no regional wall  motion abnormalities, mildly dilated LV cavity size, moderate LVH, grade 1 diastolic dysfunction, normal RV systolic function and ventricular cavity size, moderately dilated left atrium, mild mitral regurgitation, mild aortic insufficiency, mild to moderate aortic valve sclerosis without evidence of stenosis, dilated aortic root measuring 39 mm.  He has been followed in our office with ongoing dyspnea and uncontrolled hypertension.  It is noted he has been intolerant to both hydralazine and Imdur secondary to headaches.  With ongoing exertional dyspnea there has been some discussion regarding potential diagnostic R/LHC.  He was most recently seen in our office on 07/31/2020 and reported feeling less dyspneic and fatigued.  It was noted he had recently began to perform peritoneal dialysis at home as he felt better than when he used the machine.  BP remained elevated at 474 systolic with a repeat BP of 170/70.  It was noted he had not been taking his clonidine as prescribed (taking twice daily instead of 3 times daily).  He has not been maintained on ACE inhibitor/ARB/MRA in the setting of ESRD with history of hyperkalemia.  He was advised to take clonidine 3 times daily.  It was noted he was partaking in foods high in sodium.  With regards to his cardiomyopathy and exertional dyspnea, he preferred to assess his symptoms over the next several months.  He was admitted to [facility] on 3/16 with increased dyspnea, lower extremity swelling, orthopnea, and weight gain.  BP at home has been consistently in the 259D systolic.  Never with chest pain.  Upon his arrival to [facility] ED he was hypertensive with a BP of 213/83, heart rate  57 bpm, afebrile, O2 saturation 100% on room air.  Labs were notable for negative high-sensitivity troponin x2, BNP 1785, BUN/SCR 41/6.51, potassium 4.5, Hgb 10.7.  Abdominal plain film showed nonobstructive bowel gas pattern.  Chest x-ray showed no active cardiopulmonary disease.  EKG showed sinus  bradycardia, 57 bpm, significant baseline artifact/wandering, incomplete RBBB, nonspecific ST-T changes.  Given he still makes good urine he was given IV Lasix in the ED which was continued upon admission.  At time of cardiology consult he continues to deny chest pain though does report continued chronic exertional dyspnea.   Past Medical History:  Diagnosis Date  . Adenocarcinoma of appendix [facility]) Jan 2006   right kidney, s/p cryoablation  . Cardiomyopathy ([place])    a. 12/2018 Echo: EF 40-45%, global HK. Asc Ao 3.7cm; b. 01/2019 Lexi MV: small, mild, fixed basal and mid antlat defect - scar vs artifact. Small, mild mid and apical inf minimally reversible defect, likely scar w/ peri-infarct ischemia. Coronary and Ao atherosclerosis; c. 12/2019 Echo: EF 40-45%, glob HK, Gr1 DD. Nl RV fxn. Sev dil LA. Mild MR. Mild-mod Ao sclerosis w/o stenosis. Asc Ao 87mm.  . Carotid arterial disease ([place])    a. 01/2020 RICA 1-39%, RCCA/RECA < 16%, LICA 1-09%, LCCA/LECA <50%.  . Claudication ([place])    a. 02/2020 ABI/TBI: R 1.08/0.95, L 1.00/0.70.  Marland Kitchen Complication of anesthesia    had to be woken up slowly as his bp was elevated when did this quickly  . Diabetes mellitus without complication ([place])   . ESRD (end stage renal disease) ([place])    a. Peritoneal Dialysis pt.  . Hyperlipidemia   . Hypertension   . Migraine    cluster  . Neuromuscular disorder ([place])    left lower extrem neuropathy  . Obstructive sleep apnea    no OSA since had facial surgery with dr. Kathyrn Sheriff in 1997  . PAD (peripheral artery disease) [facility]) Feb 2009   nonobstructing, renal angiogram ([place])  . Renal cell carcinoma 2004   left kidney heminephrectomy  . Renal insufficiency   . Stroke [facility])    a. 12/2019 MRI: Small acute infarcts involving the left cerebral hemisphere and several chronic infarcts.  Marland Kitchen TIA (transient ischemic attack)    no residual but left leg and foot still feel heavy  . tobacco abuse     Past Surgical History:   Procedure Laterality Date  . CAPD INSERTION N/A 03/01/2019   Procedure: LAPAROSCOPIC INSERTION CONTINUOUS AMBULATORY PERITONEAL DIALYSIS  (CAPD) CATHETER;  Surgeon: Algernon Huxley, MD;  Location: [facility] ORS;  Service: Vascular;  Laterality: N/A;  . CARDIAC CATHETERIZATION     Dr. [name] did this to assess his renal artery  . cyst removal  12/25/2015   Spine L4 and L5  . heminephrectomy  2004   for renal cell CA  . RENAL CRYOABLATION  Jan 2006   right kidney,  [name]  . sciatica       Home Meds: Prior to Admission medications   Medication Sig Start Date End Date Taking? Authorizing Provider  amLODipine (NORVASC) 10 MG tablet TAKE 1 TABLET BY MOUTH ONCE DAILY 06/09/20  Yes Crecencio Mc, MD  aspirin 81 MG tablet Take 1 tablet (81 mg total) by mouth daily. 05/16/14  Yes Crecencio Mc, MD  carvedilol (COREG) 25 MG tablet Take 25 mg by mouth 2 (two) times daily with a meal. 07/07/17  Yes [provider]  cloNIDine (CATAPRES) 0.2 MG tablet Take 0.2 mg by mouth 3 (  three) times daily. 03/06/20  Yes [provider]  clopidogrel (PLAVIX) 75 MG tablet Take 1 tablet (75 mg total) by mouth daily. 04/02/20  Yes Theora Gianotti, NP  doxazosin (CARDURA) 8 MG tablet Take 8 mg by mouth daily. 03/29/20  Yes [provider]  ezetimibe (ZETIA) 10 MG tablet Take 1 tablet (10 mg total) by mouth daily. 05/09/19 04/02/29 Yes Theora Gianotti, NP  fluticasone (FLONASE) 50 MCG/ACT nasal spray Place 2 sprays into both nostrils daily. 05/29/18  Yes Crecencio Mc, MD  multivitamin (RENA-VIT) TABS tablet Take 1 tablet by mouth at bedtime. 12/21/19  Yes [provider]  nicotine (NICODERM CQ - DOSED IN MG/24 HOURS) 14 mg/24hr patch Uses occassionally   Yes [provider]  rosuvastatin (CRESTOR) 20 MG tablet Take 1 tablet (20 mg total) by mouth at bedtime. 04/02/20 04/02/21 Yes Theora Gianotti, NP  albuterol (VENTOLIN HFA) 108 (90 Base) MCG/ACT  inhaler Inhale 2 puffs into the lungs every 6 (six) hours as needed for wheezing or shortness of breath. 04/12/19   Crecencio Mc, MD  calcitRIOL (ROCALTROL) 0.25 MCG capsule Take 0.25 mcg by mouth every Monday, Wednesday, and Friday.    [provider]  diphenhydrAMINE (BENADRYL) 25 MG tablet Take 25 mg by mouth every 6 (six) hours as needed for allergies.    [provider]  sodium bicarbonate 325 MG tablet Take 325 mg by mouth 2 (two) times daily. Patient not taking: Reported on 08/06/2020 02/14/18   [provider]    Inpatient Medications: Scheduled Meds: . amLODipine  10 mg Oral QPM  . carvedilol  25 mg Oral BID WC  . gentamicin cream  1 application Topical Daily  . heparin  5,000 Units Subcutaneous Q8H  . insulin aspart  0-5 Units Subcutaneous QHS  . insulin aspart  0-9 Units Subcutaneous TID WC  . irbesartan  150 mg Oral Daily  . sodium chloride flush  3 mL Intravenous Q12H   Continuous Infusions: . sodium chloride    . dialysis solution 2.5% low-MG/low-CA     PRN Meds: sodium chloride, acetaminophen, heparin, ondansetron (ZOFRAN) IV, sodium chloride flush  Allergies:   Allergies  Allergen Reactions  . Irbesartan Other (See Comments)    hyperkalemia  . Cymbalta [Duloxetine Hcl]   . Hydralazine Other (See Comments)    headache  . Imdur [Isosorbide Nitrate] Other (See Comments)    headache  . Atorvastatin Other (See Comments)    Muscle pain  . Bystolic [Nebivolol Hcl]     Extreme fatigue     Social History:   Social History   Socioeconomic History  . Marital status: Married    Spouse name: Bailey Mech  . Number of children: Not on file  . Years of education: Not on file  . Highest education level: Not on file  Occupational History  . 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 Smoking status: Current Every Day Smoker    Packs/day: 1.00    Years: 50.00    Pack years: 50.00    Types: Cigarettes  . Smokeless tobacco:  Never Used  Vaping Use  . Vaping Use: Former  . Devices: tried but did not like  Substance and Sexual Activity  . Alcohol use: Not Currently  . Drug use: No  . Sexual activity: Yes  Other Topics Concern  . Not on file  Social History Narrative  . Not on file   Social Determinants of Health  Financial Resource Strain: Not on file  Food Insecurity: No Food Insecurity  . Worried About Charity fundraiser in the Last Year: Never true  . Ran Out of Food in the Last Year: Never true  Transportation Needs: No Transportation Needs  . Lack of Transportation (Medical): No  . Lack of Transportation (Non-Medical): No  Physical Activity: Not on file  Stress: No Stress Concern Present  . Feeling of Stress : Only a little  Social Connections: Not on file  Intimate Partner Violence: Not on file     Family History:   Family History  Problem Relation Age of Onset  . Hypertension Mother   . Cancer Mother        breast  . Aneurysm Mother   . Coronary artery disease Father   . Hypertension Father   . Stroke Father 39  . Heart disease Father   . Heart attack Father 58  . Aneurysm Maternal Grandmother        brain  . Aneurysm Paternal Grandmother        brain  . Coronary artery disease Paternal Grandfather   . Heart disease Brother        valvular heart disease  . COPD Brother   . Hypertension Brother   . Stroke Paternal Uncle     ROS:  Review of Systems  Constitutional: Positive for malaise/fatigue. Negative for chills, diaphoresis, fever and weight loss.  HENT: Negative for congestion.   Eyes: Negative for discharge and redness.  Respiratory: Positive for shortness of breath. Negative for cough, sputum production and wheezing.   Cardiovascular: Positive for orthopnea and leg swelling. Negative for chest pain, palpitations, claudication and PND.  Gastrointestinal: Negative for abdominal pain, heartburn, nausea and vomiting.  Musculoskeletal: Negative for falls and myalgias.   Skin: Negative for rash.  Neurological: Positive for weakness. Negative for dizziness, tingling, tremors, sensory change, speech change, focal weakness and loss of consciousness.  Endo/Heme/Allergies: Does not bruise/bleed easily.  Psychiatric/Behavioral: Negative for substance abuse. The patient is not nervous/anxious.   All other systems reviewed and are negative.     Physical Exam/Data:   Vitals:   08/07/20 0427 08/07/20 0558 08/07/20 0728 08/07/20 0959  BP:  (!) 218/88 (!) 186/67 (!) 190/70  Pulse:  68 61   Resp:  20 16   Temp:  97.8 F (36.6 C) (!) 97.3 F (36.3 C)   TempSrc:  Oral Oral   SpO2:  97% 99%   Weight: 69 kg     Height:        Intake/Output Summary (Last 24 hours) at 08/07/2020 1049 Last data filed at 08/07/2020 0945 Gross per 24 hour  Intake 723 ml  Output 200 ml  Net 523 ml   Filed Weights   08/06/20 1800 08/06/20 2127 08/07/20 0427  Weight: 67.1 kg 68.9 kg 69 kg   Body mass index is 24.57 kg/m.   Physical Exam: General: Well developed, well nourished, in no acute distress. Head: Normocephalic, atraumatic, sclera non-icteric, no xanthomas, nares without discharge.  Neck: Negative for carotid bruits. JVD not elevated. Lungs: Diminished breath sounds bilaterally. Breathing is unlabored. Heart: RRR with S1 S2. I/Vi systolic murmur, no rubs, or gallops appreciated. Abdomen: Soft, non-tender, non-distended with normoactive bowel sounds. No hepatomegaly. No rebound/guarding. No obvious abdominal masses. Msk:  Strength and tone appear normal for age. Extremities: No clubbing or cyanosis. 1-2+ bilateral lower extremity edema. Distal pedal pulses are 2+ and equal bilaterally. Neuro: Alert and oriented X 3. No  facial asymmetry. No focal deficit. Moves all extremities spontaneously. Psych:  Responds to questions appropriately with a normal affect.   EKG:  The EKG was personally reviewed and demonstrates: sinus bradycardia, 57 bpm, significant baseline  artifact/wandering, incomplete RBBB, nonspecific ST-T changes Telemetry:  Telemetry was personally reviewed and demonstrates: SR  Weights: Filed Weights   08/06/20 1800 08/06/20 2127 08/07/20 0427  Weight: 67.1 kg 68.9 kg 69 kg    Relevant CV Studies:  2D echo 04/2020: 1. Left ventricular ejection fraction, by estimation, is 40 to 45%. The  left ventricle has mildly decreased function. The left ventricle has no  regional wall motion abnormalities. The left ventricular internal cavity  size was mildly dilated. There is  moderate left ventricular hypertrophy. Left ventricular diastolic  parameters are consistent with Grade I diastolic dysfunction (impaired  relaxation). The average left ventricular global longitudinal strain is  -13.7 %. The global longitudinal strain is  abnormal.  2. Right ventricular systolic function is normal. The right ventricular  size is normal. Tricuspid regurgitation signal is inadequate for assessing  PA pressure.  3. Left atrial size was moderately dilated.  4. The mitral valve is normal in structure. Mild mitral valve  regurgitation. No evidence of mitral stenosis.  5. The aortic valve is normal in structure. Aortic valve regurgitation is  mild. Mild to moderate aortic valve sclerosis/calcification is present,  without any evidence of aortic stenosis.  6. Aortic dilatation noted. There is mild dilatation of the aortic root,  measuring 39 mm.  7. The inferior vena cava is dilated in size with >50% respiratory  variability, suggesting right atrial pressure of 8 mmHg. __________  Elwyn Reach patch 01/2020:  The patient was monitored for 14 days.  The predominant rhythm was sinus with an average rate of 74 bpm (range 47-115 bpm in sinus).  There were rare PACs and occasional PVCs.  Two episodes of nonsustained ventricular tachycardia lasting up to 6 beats with a maximum rate of 182 bpm occurred.  There were 28 atrial runs lasting up to 13 beats with  a maximum rate of 169 bpm.  No sustained arrhythmia or prolonged pause was identified.  There were no patient triggered events.   Predominately sinus rhythm with rare PACs and occasional PVCs.  Brief NSVT and PSVT also noted. __________  2D echo 12/2019: 1. Left ventricular ejection fraction, by estimation, is 40 to 45%. The  left ventricle has mildly decreased function. The left ventricle  demonstrates global hypokinesis. There is moderate left ventricular  hypertrophy. Left ventricular diastolic  parameters are consistent with Grade I diastolic dysfunction (impaired  relaxation). Elevated left atrial pressure.  2. Right ventricular systolic function is normal. The right ventricular  size is normal. Mildly increased right ventricular wall thickness.  3. Left atrial size was severely dilated.  4. The mitral valve is degenerative. Mild mitral valve regurgitation. No  evidence of mitral stenosis.  5. Unable to determine valve morphology due to image quality. Aortic  valve regurgitation is mild. Mild to moderate aortic valve  sclerosis/calcification is present, without any evidence of aortic  stenosis.  6. Aortic dilatation noted. There is mild dilatation of the ascending  aorta measuring 37 mm.  7. The inferior vena cava is normal in size with greater than 50%  respiratory variability, suggesting right atrial pressure of 3 mmHg.  8. Agitated saline contrast bubble study was negative, with no evidence  of any interatrial shunt. __________  2D echo 12/2018:1. The left ventricle has mild-moderately reduced systolic  function, with  an ejection fraction of 40-45%. The cavity size was normal. There is mild  to moderately increased left ventricular wall thickness. Left ventricular  diffuse hypokinesis.  2. The right ventricle has normal systolic function. The cavity was  normal. There is no increase in right ventricular wall thickness. Unable  to estimate RSVP  3. Left atrial  size was mild-moderately dilated.  4. Aortic valve regurgitation is mild  5. Mitral valve regurgitation is mild to moderate  6. There is dilatation of the aortic root 3.7 cm and of the ascending  aorta 3.7 cm.  7. Sinus bradycardia noted , rate down to 51 bpm   Laboratory Data:  Chemistry Recent Labs  Lab 08/06/20 1804 08/07/20 0423  NA 140 140  K 4.5 3.7  CL 106 108  CO2 25 23  GLUCOSE 132* 92  BUN 41* 43*  CREATININE 6.51* 6.70*  CALCIUM 8.3* 8.2*  GFRNONAA 8* 8*  ANIONGAP 9 9    No results for input(s): PROT, ALBUMIN, AST, ALT, ALKPHOS, BILITOT in the last 168 hours. Hematology Recent Labs  Lab 08/06/20 1804  WBC 10.3  RBC 3.38*  HGB 10.7*  HCT 31.6*  MCV 93.5  MCH 31.7  MCHC 33.9  RDW 15.5  PLT 188   Cardiac EnzymesNo results for input(s): TROPONINI in the last 168 hours. No results for input(s): TROPIPOC in the last 168 hours.  BNP Recent Labs  Lab 08/06/20 1804  BNP 1,785.3*    DDimer No results for input(s): DDIMER in the last 168 hours.  Radiology/Studies:  DG Chest 2 View  Result Date: 08/06/2020 IMPRESSION: Stable cardiomegaly with a very small left pleural effusion versus pleural thickening. Electronically Signed   By: Virgina Norfolk M.D.   On: 08/06/2020 18:35    Assessment and Plan:   1. Chronic dyspnea/HFrEF/hypertensive urgency: -In the past, as recently as 07/31/2020, he has deferred proceeding with diagnostic R/LHC, this can be readdressed in the outpatient setting  -High-sensitivity troponin negative x2 this admission -Suspect his symptoms of chronic dyspnea are multifactorial including constellation of hypertensive urgency in the setting of ESRD with known HFrEF leading to hypertensive heart disease and volume overload -This is somewhat of a difficult situation to manage moving forward given his intolerance to Imdur/hydralazine with headaches making BiDil unavailable  -BP remains elevated though he has not received multiple  antihypertensives today yet -He does consume a diet high in sodium and this needs to be addressed -Resume PTA clonidine and Cardura -Amlodipine  -Irbesartan, watch potassium (previously did not tolerate secondary to hyperkalemia) -Coreg  2. ESRD on PD: -Per nephrology   3. Anemia of chronic disease: -Stable   4.  History of CVA: -Cardiac monitoring has demonstrated no objective evidence of A. Fib -Remains on aspirin and Plavix  5.  HLD: -PTA Crestor -Target LDL less than 70   For questions or updates, please contact [place] Please consult [email or web address] for contact info under Cardiology/STEMI.   Signed, Christell Faith, PA-C [facility] Pager: [phone] 08/07/2020, 10:49 AM

## 2020-08-07 NOTE — Plan of Care (Signed)

## 2020-08-07 NOTE — Progress Notes (Signed)
   08/07/20 1958  Assess: MEWS Score  Temp 97.9 F (36.6 C)  BP (!) 210/74  Pulse Rate 60  ECG Heart Rate (!) 57  Resp 20  Level of Consciousness Alert  SpO2 99 %  O2 Device Nasal Cannula  O2 Flow Rate (L/min) 1 L/min  Assess: MEWS Score  MEWS Temp 0  MEWS Systolic 2  MEWS Pulse 0  MEWS RR 0  MEWS LOC 0  MEWS Score 2  MEWS Score Color Yellow  Assess: if the MEWS score is Yellow or Red  Were vital signs taken at a resting state? Yes  Focused Assessment No change from prior assessment  Early Detection of Sepsis Score *See Row Information* Low  MEWS guidelines implemented *See Row Information* No, previously yellow, continue vital signs every 4 hours  Treat  Pain Scale 0-10  Pain Score 0

## 2020-08-07 NOTE — Progress Notes (Signed)
Hypoglycemic Event  CBG: 55  Treatment: 8 oz juice/soda  Symptoms: None  Follow-up CBG: Time:1645 CBG Result: 100  Possible Reasons for Event: Inadequate meal intake  Comments/MD notified: Dr. Wynelle Cleveland made aware.

## 2020-08-07 NOTE — Progress Notes (Signed)
   08/07/20 1535  Assess: MEWS Score  Temp 97.9 F (36.6 C)  BP (!) 201/73  Pulse Rate (!) 58  Resp 15  Level of Consciousness Alert  SpO2 98 %  O2 Device Room Air  Assess: MEWS Score  MEWS Temp 0  MEWS Systolic 2  MEWS Pulse 0  MEWS RR 0  MEWS LOC 0  MEWS Score 2  MEWS Score Color Yellow  Assess: if the MEWS score is Yellow or Red  Were vital signs taken at a resting state? Yes  Focused Assessment No change from prior assessment  Early Detection of Sepsis Score *See Row Information* Low  MEWS guidelines implemented *See Row Information* Yes  Treat  Pain Scale 0-10  Pain Score 0  Take Vital Signs  Increase Vital Sign Frequency  Yellow: Q 2hr X 2 then Q 4hr X 2, if remains yellow, continue Q 4hrs  Escalate  MEWS: Escalate Yellow: discuss with charge nurse/RN and consider discussing with provider and RRT  Notify: Charge Nurse/RN  Name of Charge Nurse/RN Notified Mendel Ryder  Date Charge Nurse/RN Notified 08/07/20  Time Charge Nurse/RN Notified 1540  Document  Patient Outcome Stabilized after interventions (see MAR for BP meds)

## 2020-08-07 NOTE — Progress Notes (Addendum)
As per dialysis RN unable to complete PD treatment this evening 2/2 pts PD catheter not working properly. NP Rufina Falco and MD Judd Gaudier notified via secure chat. No further actions at this time, will continue to monitor.

## 2020-08-07 NOTE — Progress Notes (Signed)
[facility], [place] 08/07/20  Subjective:   LOS: 1  Patient known to our practice from outpatient dialysis.  Yesterday he presented from home for increasing shortness of breath and worsening lower extremity edema.  Patient is a current smoker.  He has some wet sounding cough. Chest x-ray from yesterday showed cardiomegaly, small pleural effusions and hyperinflated lungs consistent with COPD.  Moderate.  Severe calcification of the aortic arch. Today, he feels better.  He has been able to eat without nausea or vomiting.  Lower extremity edema has improved with IV Lasix.  He is currently on room air.  Still has wet sounding cough.  PD could not be completed last night because of Technical difficulties initiating all catheter    Objective:  Vital signs in last 24 hours:  Temp:  [97.3 F (36.3 C)-97.8 F (36.6 C)] 97.3 F (36.3 C) (03/17 0728) Pulse Rate:  [51-68] 61 (03/17 0728) Resp:  [16-22] 16 (03/17 0728) BP: (109-218)/(67-88) 186/67 (03/17 0728) SpO2:  [96 %-100 %] 99 % (03/17 0728) Weight:  [67.1 kg-69 kg] 69 kg (03/17 0427)  Weight change:  Filed Weights   08/06/20 1800 08/06/20 2127 08/07/20 0427  Weight: 67.1 kg 68.9 kg 69 kg    Intake/Output:    Intake/Output Summary (Last 24 hours) at 08/07/2020 0950 Last data filed at 08/07/2020 0400 Gross per 24 hour  Intake 243 ml  Output 200 ml  Net 43 ml   Physical Exam: General:  No acute distress, laying in the bed  HEENT  anicteric, moist oral mucous membrane  Pulm/lungs  normal breathing effort, room air, mild diffuse rhonchi  CVS/Heart  regular rhythm, no rub or gallop  Abdomen:   Soft, nontender  Extremities:  + peripheral edema  Neurologic:  Alert, oriented, able to follow commands  Skin:  No acute rashes  PD catheter in place     Basic Metabolic Panel:  Recent Labs  Lab 08/06/20 1804 08/06/20 1929 08/07/20 0423  NA 140  --  140  K 4.5  --  3.7  CL 106  --  108  CO2 25  --   23  GLUCOSE 132*  --  92  BUN 41*  --  43*  CREATININE 6.51*  --  6.70*  CALCIUM 8.3*  --  8.2*  MG  --  2.2  --      CBC: Recent Labs  Lab 08/06/20 1804  WBC 10.3  HGB 10.7*  HCT 31.6*  MCV 93.5  PLT 188     No results found for: HEPBSAG, HEPBSAB, HEPBIGM    Microbiology:  Recent Results (from the past 240 hour(s))  Resp Panel by RT-PCR (Flu A&B, Covid) Nasopharyngeal Swab     Status: None   Collection Time: 08/06/20  7:29 PM   Specimen: Nasopharyngeal Swab; Nasopharyngeal(NP) swabs in vial transport medium  Result Value Ref Range Status   SARS Coronavirus 2 by RT PCR NEGATIVE NEGATIVE Final    Comment: (NOTE) SARS-CoV-2 target nucleic acids are NOT DETECTED.  The SARS-CoV-2 RNA is generally detectable in upper respiratory specimens during the acute phase of infection. The lowest concentration of SARS-CoV-2 viral copies this assay can detect is 138 copies/mL. A negative result does not preclude SARS-Cov-2 infection and should not be used as the sole basis for treatment or other patient management decisions. A negative result may occur with  improper specimen collection/handling, submission of specimen other than nasopharyngeal swab, presence of viral mutation(s) within the areas targeted by  this assay, and inadequate number of viral copies(<138 copies/mL). A negative result must be combined with clinical observations, patient history, and epidemiological information. The expected result is Negative.  Fact Sheet for Patients:  EntrepreneurPulse.com.au  Fact Sheet for Healthcare Providers:  IncredibleEmployment.be  This test is no t yet approved or cleared by the [place] FDA and  has been authorized for detection and/or diagnosis of SARS-CoV-2 by FDA under an Emergency Use Authorization (EUA). This EUA will remain  in effect (meaning this test can be used) for the duration of the COVID-19 declaration under Section  564(b)(1) of the Act, 21 U.S.C.section 360bbb-3(b)(1), unless the authorization is terminated  or revoked sooner.       Influenza A by PCR NEGATIVE NEGATIVE Final   Influenza B by PCR NEGATIVE NEGATIVE Final    Comment: (NOTE) The Xpert Xpress SARS-CoV-2/FLU/RSV plus assay is intended as an aid in the diagnosis of influenza from Nasopharyngeal swab specimens and should not be used as a sole basis for treatment. Nasal washings and aspirates are unacceptable for Xpert Xpress SARS-CoV-2/FLU/RSV testing.  Fact Sheet for Patients: EntrepreneurPulse.com.au  Fact Sheet for Healthcare Providers: IncredibleEmployment.be  This test is not yet approved or cleared by the [place] FDA and has been authorized for detection and/or diagnosis of SARS-CoV-2 by FDA under an Emergency Use Authorization (EUA). This EUA will remain in effect (meaning this test can be used) for the duration of the COVID-19 declaration under Section 564(b)(1) of the Act, 21 U.S.C. section 360bbb-3(b)(1), unless the authorization is terminated or revoked.  Performed at [facility], [address] [place], [place] [address]     Coagulation Studies: No results for input(s): LABPROT, INR in the last 72 hours.  Urinalysis: No results for input(s): COLORURINE, LABSPEC, PHURINE, GLUCOSEU, HGBUR, BILIRUBINUR, KETONESUR, PROTEINUR, UROBILINOGEN, NITRITE, LEUKOCYTESUR in the last 72 hours.  Invalid input(s): APPERANCEUR    Imaging: DG Chest 2 View  Result Date: 08/06/2020 CLINICAL DATA:  Shortness of breath. EXAM: CHEST - 2 VIEW COMPARISON:  May 11, 2020 FINDINGS: The lungs are hyperinflated. Chronic appearing increased lung markings are seen. Mild blunting of the left costophrenic angle is noted. There is no evidence of a pneumothorax. The cardiac silhouette is mildly enlarged. Moderate severity calcification of the aortic arch is seen. The visualized skeletal  structures are unremarkable. IMPRESSION: Stable cardiomegaly with a very small left pleural effusion versus pleural thickening. Electronically Signed   By: Virgina Norfolk M.D.   On: 08/06/2020 18:35     Medications:   . sodium chloride    . dialysis solution 2.5% low-MG/low-CA    . furosemide 120 mg (08/07/20 0858)   . carvedilol  25 mg Oral BID WC  . gentamicin cream  1 application Topical Daily  . heparin  5,000 Units Subcutaneous Q8H  . hydrALAZINE  10 mg Intravenous Once  . insulin aspart  0-5 Units Subcutaneous QHS  . insulin aspart  0-9 Units Subcutaneous TID WC  . irbesartan  150 mg Oral Daily  . sodium chloride flush  3 mL Intravenous Q12H   sodium chloride, acetaminophen, heparin, ondansetron (ZOFRAN) IV, sodium chloride flush  Assessment/ Plan:  75 y.o. male with coronary artery disease, systolic CHF with an EF of 45% from December 2021, end-stage renal disease, hypertension, cardiovascular disease, sleep apnea, history of renal cell carcinoma status post left partial nephrectomy, history of stroke was admitted on 08/06/2020 for  Principal Problem:   ESRD on peritoneal dialysis [facility]) Active Problems:   Prostate  cancer [facility])   Coronary artery disease involving native coronary artery of native heart without angina pectoris   Hypertensive urgency   Acute on chronic systolic congestive heart failure (HCC)   Fluid overload   Anemia of chronic disease  ESRD (end stage renal disease) ([place]) [N18.6] Fluid overload [E87.70] Other hypervolemia [E87.79]  Home PD regimen of fill volume 2000 cc x 3 cycles  #. ESRD Patient reports fullness with 2000 cc volume therefore will reduce to 1520.  Number of cycles to 4. Continue IV furosemide for volume control. Consider switching to oral torsemide tomorrow -X-ray abdomen for PD catheter placement -Repeat chest x-ray  #Hypertension, severe, shortness of breath   BP Readings from Last 2 Encounters:  08/07/20 (!) 186/67   07/31/20 (!) 180/70   Severe hypertension and volume overload of likely causing patient's shortness of breath. -Volume control has improved with IV Lasix -We will add irbesartan to patient's regimen.  Previously he has tried it but may have caused hyperkalemia.  Currently potassium is in the low normal range.  Patient and his wife confirm that he does not have anaphylactic type of allergy. -Previously hydralazine and Imdur has caused headache therefore patient is not willing to try it again this morning -Dose amlodipine in the evening   #. Anemia of CKD  Lab Results  Component Value Date   HGB 10.7 (L) 08/06/2020   Monitor during hospitalization  #. Secondary hyperparathyroidism of renal origin N 25.81      Component Value Date/Time   PTH 315 (H) 05/11/2017 1450   No results found for: PHOS Monitor calcium and phos level during this admission     LOS: [place] 3/17/20229:50 [place], [place]

## 2020-08-07 NOTE — Progress Notes (Signed)
Pt refused 1x dose 10mg  Hydralazine IVP. Pt states ti gives him a headache. Pt educated on importance of taking BP medicine for BP of 210/82. Pt still adamant about refusal. NP Rufina Falco notified of pt refusal. No further actions at this time, will continue to monitor.

## 2020-08-07 NOTE — Progress Notes (Addendum)
PROGRESS NOTE    [name]   ALP:[id number]  DOB: 1946-05-10  DOA: 08/06/2020 PCP: Crecencio Mc, MD   Brief Narrative:  [name]  is a 75 y.o. male with medical history significant for CAD, systolic heart failure, last EF 40 to 45% December 2021, ESRD on PD, hypertension, peripheral arterial disease, sleep apnea, renal cell carcinoma status post left heminephrectomy,history of CVA who presents to the emergency room with a 2-week history of progressively worsening shortness of breath, now associated with orthopnea, lower extremity edema and weight gain of several pounds in the last few days. He has also has very uncontrolled BP despite many medication changes as outpatient.    Subjective: Although he has urinated well with Lasix and his ankles are less swollen, he is still having trouble breathing today. When he lays flat and runs the peritoneal dialysis machine, he becomes short of breath and starts to cough.     Assessment & Plan:   Principal Problem:   ESRD on peritoneal dialysis /  Acute on chronic systolic congestive heart failure ([place]) - has improved somewhat with Lasix but not enough - dialysis catheter became clotted overnight last night- have notified renal - renal has planned to increase the exchanges today and decrease the volume- follow  Active Problems:  h/o CAD - no c/o chest pain - cont, statin Plavix and ASA  Nicotine abuse - counseled to quit    Hypertensive urgency - resume home meds- remove excess fluid- PRN Labetalol given    Anemia of chronic disease - Hb 10-11 range- follow  DM2 - A1c is 6.3- apparently is diet controlled as outpt  Time spent in minutes: 35 DVT prophylaxis: heparin injection 5,000 Units Start: 08/06/20 2200  Code Status: Full code Family Communication: daughter at bedside Level of Care: Level of care: Progressive Cardiac Disposition Plan:  Status is: Inpatient  Remains inpatient appropriate because:IV treatments  appropriate due to intensity of illness or inability to take PO   Dispo: The patient is from: Home              Anticipated d/c is to: TBD              Patient currently is not medically stable to d/c.   Difficult to place patient No      Consultants:   Nephrology  Cardiology Procedures:   none Antimicrobials:  Anti-infectives (From admission, onward)   None       Objective: Vitals:   08/07/20 0558 08/07/20 0728 08/07/20 0959 08/07/20 1132  BP: (!) 218/88 (!) 186/67 (!) 190/70 (!) 195/77  Pulse: 68 61  60  Resp: 20 16  15   Temp: 97.8 F (36.6 C) (!) 97.3 F (36.3 C)  98.2 F (36.8 C)  TempSrc: Oral Oral    SpO2: 97% 99%  100%  Weight:      Height:        Intake/Output Summary (Last 24 hours) at 08/07/2020 1354 Last data filed at 08/07/2020 0945 Gross per 24 hour  Intake 723 ml  Output 200 ml  Net 523 ml   Filed Weights   08/06/20 1800 08/06/20 2127 08/07/20 0427  Weight: 67.1 kg 68.9 kg 69 kg    Examination: General exam: Appears comfortable  HEENT: PERRLA, oral mucosa moist, no sclera icterus or thrush Respiratory system: crackles at bases Respiratory effort normal. Cardiovascular system: S1 & S2 heard, RRR.   Gastrointestinal system: Abdomen soft, non-tender, nondistended. Normal bowel sounds. Central nervous system:  Alert and oriented. No focal neurological deficits. Extremities: No cyanosis, clubbing or edema Skin: No rashes or ulcers Psychiatry:  Mood & affect appropriate.     Data Reviewed: I have personally reviewed following labs and imaging studies  CBC: Recent Labs  Lab 08/06/20 1804  WBC 10.3  HGB 10.7*  HCT 31.6*  MCV 93.5  PLT 630   Basic Metabolic Panel: Recent Labs  Lab 08/06/20 1804 08/06/20 1929 08/07/20 0423  NA 140  --  140  K 4.5  --  3.7  CL 106  --  108  CO2 25  --  23  GLUCOSE 132*  --  92  BUN 41*  --  43*  CREATININE 6.51*  --  6.70*  CALCIUM 8.3*  --  8.2*  MG  --  2.2  --    GFR: Estimated  Creatinine Clearance: 8.7 mL/min (A) (by C-G formula based on SCr of 6.7 mg/dL (H)). Liver Function Tests: No results for input(s): AST, ALT, ALKPHOS, BILITOT, PROT, ALBUMIN in the last 168 hours. No results for input(s): LIPASE, AMYLASE in the last 168 hours. No results for input(s): AMMONIA in the last 168 hours. Coagulation Profile: No results for input(s): INR, PROTIME in the last 168 hours. Cardiac Enzymes: No results for input(s): CKTOTAL, CKMB, CKMBINDEX, TROPONINI in the last 168 hours. BNP (last 3 results) No results for input(s): PROBNP in the last 8760 hours. HbA1C: Recent Labs    08/06/20 2126  HGBA1C 6.3*   CBG: Recent Labs  Lab 08/06/20 2128 08/07/20 0727 08/07/20 1129  GLUCAP 92 98 146*   Lipid Profile: No results for input(s): CHOL, HDL, LDLCALC, TRIG, CHOLHDL, LDLDIRECT in the last 72 hours. Thyroid Function Tests: No results for input(s): TSH, T4TOTAL, FREET4, T3FREE, THYROIDAB in the last 72 hours. Anemia Panel: No results for input(s): VITAMINB12, FOLATE, FERRITIN, TIBC, IRON, RETICCTPCT in the last 72 hours. Urine analysis:    Component Value Date/Time   COLORURINE YELLOW 01/21/2020 [place] 01/21/2020 1223   APPEARANCEUR Clear 01/03/2012 1117   LABSPEC 1.010 01/21/2020 1223   LABSPEC 1.004 01/03/2012 1117   PHURINE 7.0 01/21/2020 1223   GLUCOSEU 100 (A) 01/21/2020 1223   HGBUR TRACE-INTACT (A) 01/21/2020 1223   BILIRUBINUR NEGATIVE 01/21/2020 1223   BILIRUBINUR neg 07/30/2013 1127   BILIRUBINUR Negative 01/03/2012 [place] 01/21/2020 1223   PROTEINUR 100 07/30/2013 1127   PROTEINUR Negative 01/03/2012 1117   PROTEINUR 30 (A) 06/30/2011 0822   UROBILINOGEN 0.2 01/21/2020 1223   NITRITE NEGATIVE 01/21/2020 1223   LEUKOCYTESUR NEGATIVE 01/21/2020 1223   LEUKOCYTESUR Negative 01/03/2012 1117   Sepsis Labs: @LABRCNTIP (procalcitonin:4,lacticidven:4) ) Recent Results (from the past 240 hour(s))  Resp Panel by  RT-PCR (Flu A&B, Covid) Nasopharyngeal Swab     Status: None   Collection Time: 08/06/20  7:29 PM   Specimen: Nasopharyngeal Swab; Nasopharyngeal(NP) swabs in vial transport medium  Result Value Ref Range Status   SARS Coronavirus 2 by RT PCR NEGATIVE NEGATIVE Final    Comment: (NOTE) SARS-CoV-2 target nucleic acids are NOT DETECTED.  The SARS-CoV-2 RNA is generally detectable in upper respiratory specimens during the acute phase of infection. The lowest concentration of SARS-CoV-2 viral copies this assay can detect is 138 copies/mL. A negative result does not preclude SARS-Cov-2 infection and should not be used as the sole basis for treatment or other patient management decisions. A negative result may occur with  improper specimen collection/handling, submission of specimen other than nasopharyngeal swab,  presence of viral mutation(s) within the areas targeted by this assay, and inadequate number of viral copies(<138 copies/mL). A negative result must be combined with clinical observations, patient history, and epidemiological information. The expected result is Negative.  Fact Sheet for Patients:  EntrepreneurPulse.com.au  Fact Sheet for Healthcare Providers:  IncredibleEmployment.be  This test is no t yet approved or cleared by the [place] FDA and  has been authorized for detection and/or diagnosis of SARS-CoV-2 by FDA under an Emergency Use Authorization (EUA). This EUA will remain  in effect (meaning this test can be used) for the duration of the COVID-19 declaration under Section 564(b)(1) of the Act, 21 U.S.C.section 360bbb-3(b)(1), unless the authorization is terminated  or revoked sooner.       Influenza A by PCR NEGATIVE NEGATIVE Final   Influenza B by PCR NEGATIVE NEGATIVE Final    Comment: (NOTE) The Xpert Xpress SARS-CoV-2/FLU/RSV plus assay is intended as an aid in the diagnosis of influenza from Nasopharyngeal swab  specimens and should not be used as a sole basis for treatment. Nasal washings and aspirates are unacceptable for Xpert Xpress SARS-CoV-2/FLU/RSV testing.  Fact Sheet for Patients: EntrepreneurPulse.com.au  Fact Sheet for Healthcare Providers: IncredibleEmployment.be  This test is not yet approved or cleared by the [place] FDA and has been authorized for detection and/or diagnosis of SARS-CoV-2 by FDA under an Emergency Use Authorization (EUA). This EUA will remain in effect (meaning this test can be used) for the duration of the COVID-19 declaration under Section 564(b)(1) of the Act, 21 U.S.C. section 360bbb-3(b)(1), unless the authorization is terminated or revoked.  Performed at [facility], [address] [place], [place] [address]          Radiology Studies: DG Chest 2 View  Result Date: 08/07/2020 CLINICAL DATA:  Shortness of breath for 1 week EXAM: CHEST - 2 VIEW COMPARISON:  08/06/2020 FINDINGS: Stable mild cardiomegaly. Atherosclerotic calcification of the aortic knob. Chronic coarsened interstitial markings. No new focal airspace consolidation. No pleural effusion or pneumothorax. IMPRESSION: No active cardiopulmonary disease. Electronically Signed   By: Davina Poke D.O.   On: 08/07/2020 12:00   DG Chest 2 View  Result Date: 08/06/2020 CLINICAL DATA:  Shortness of breath. EXAM: CHEST - 2 VIEW COMPARISON:  May 11, 2020 FINDINGS: The lungs are hyperinflated. Chronic appearing increased lung markings are seen. Mild blunting of the left costophrenic angle is noted. There is no evidence of a pneumothorax. The cardiac silhouette is mildly enlarged. Moderate severity calcification of the aortic arch is seen. The visualized skeletal structures are unremarkable. IMPRESSION: Stable cardiomegaly with a very small left pleural effusion versus pleural thickening. Electronically Signed   By: Virgina Norfolk M.D.   On:  08/06/2020 18:35   DG Abd 1 View  Result Date: 08/07/2020 CLINICAL DATA:  Evaluate peritoneal dialysis catheter EXAM: ABDOMEN - 1 VIEW COMPARISON:  12/11/2012 FINDINGS: Peritoneal dialysis catheter is coiled within the pelvis, to the right of midline. The bowel gas pattern is normal. No radio-opaque calculi or other significant radiographic abnormality are seen. Surgical clips in the left hemiabdomen. IMPRESSION: 1. Nonobstructive bowel gas pattern. 2. Peritoneal dialysis catheter is coiled within the pelvis, to the right of midline. Electronically Signed   By: Davina Poke D.O.   On: 08/07/2020 12:13      Scheduled Meds: . amLODipine  10 mg Oral QPM  . carvedilol  25 mg Oral BID WC  . cloNIDine  0.2 mg Oral TID  . doxazosin  4 mg Oral BID  . gentamicin cream  1 application Topical Daily  . heparin  5,000 Units Subcutaneous Q8H  . insulin aspart  0-5 Units Subcutaneous QHS  . insulin aspart  0-9 Units Subcutaneous TID WC  . irbesartan  150 mg Oral Daily  . sodium chloride flush  3 mL Intravenous Q12H   Continuous Infusions: . sodium chloride    . dialysis solution 2.5% low-MG/low-CA       LOS: 1 day      Debbe Odea, MD [name] Pager: [email or web address] 08/07/2020, 1:54 PM

## 2020-08-08 DIAGNOSIS — I5033 Acute on chronic diastolic (congestive) heart failure: Secondary | ICD-10-CM

## 2020-08-08 LAB — GLUCOSE, CAPILLARY
Glucose-Capillary: 102 mg/dL — ABNORMAL HIGH (ref 70–99)
Glucose-Capillary: 146 mg/dL — ABNORMAL HIGH (ref 70–99)
Glucose-Capillary: 165 mg/dL — ABNORMAL HIGH (ref 70–99)
Glucose-Capillary: 132 mg/dL — ABNORMAL HIGH (ref 70–99)

## 2020-08-08 LAB — BASIC METABOLIC PANEL
Anion gap: 9 (ref 5–15)
BUN: 42 mg/dL — ABNORMAL HIGH (ref 8–23)
CO2: 24 mmol/L (ref 22–32)
Calcium: 8.3 mg/dL — ABNORMAL LOW (ref 8.9–10.3)
Chloride: 107 mmol/L (ref 98–111)
Creatinine, Ser: 6.62 mg/dL — ABNORMAL HIGH (ref 0.61–1.24)
GFR, Estimated: 8 mL/min — ABNORMAL LOW (ref >60)
Glucose, Bld: 159 mg/dL — ABNORMAL HIGH (ref 70–99)
Potassium: 3.7 mmol/L (ref 3.5–5.1)
Sodium: 140 mmol/L (ref 135–145)

## 2020-08-08 MED ORDER — FUROSEMIDE 10 MG/ML IJ SOLN
80.0000 mg | Freq: Every day | INTRAMUSCULAR | Status: DC
  Administered 2020-08-08: 80 mg via INTRAVENOUS
  Filled 2020-08-08: qty 8

## 2020-08-08 MED ORDER — IRBESARTAN 150 MG PO TABS: 300.0000 mg | ORAL_TABLET | Freq: Every day | ORAL | Status: DC

## 2020-08-08 MED ORDER — IRBESARTAN 150 MG PO TABS
300.0000 mg | ORAL_TABLET | Freq: Every day | ORAL | Status: DC
  Administered 2020-08-09: 300 mg via ORAL
  Filled 2020-08-08: qty 2

## 2020-08-08 MED ORDER — TORSEMIDE 20 MG PO TABS
40.0000 mg | ORAL_TABLET | Freq: Every day | ORAL | Status: DC
  Administered 2020-08-09: 40 mg via ORAL
  Filled 2020-08-08: qty 2

## 2020-08-08 NOTE — Progress Notes (Signed)
[facility], [place] 08/08/20  Subjective:   LOS: 2  Patient known to our practice from outpatient dialysis.  Yesterday he presented from home for increasing shortness of breath and worsening lower extremity edema.  Patient is a current smoker.  He has some wet sounding cough. Chest x-ray from yesterday showed cardiomegaly, small pleural effusions and hyperinflated lungs consistent with COPD.  Moderate.  Severe calcification of the aortic arch. Today, he feels better.  He has been able to eat without nausea or vomiting.  Lower extremity edema has improved with IV Lasix.  He is currently on room air.   Cough is better today PD treatment was uneventful last night    Objective:  Vital signs in last 24 hours:  Temp:  [97.5 F (36.4 C)-98.6 F (37 C)] 97.5 F (36.4 C) (03/18 1159) Pulse Rate:  [53-60] 56 (03/18 1159) Resp:  [15-20] 16 (03/18 1159) BP: (171-210)/(62-87) 171/81 (03/18 1159) SpO2:  [95 %-99 %] 95 % (03/18 1159) Weight:  [66.6 kg] 66.6 kg (03/18 0338)  Weight change: -0.532 kg Filed Weights   08/06/20 2127 08/07/20 0427 08/08/20 0338  Weight: 68.9 kg 69 kg 66.6 kg    Intake/Output:    Intake/Output Summary (Last 24 hours) at 08/08/2020 1256 Last data filed at 08/08/2020 0945 Gross per 24 hour  Intake 800 ml  Output 600 ml  Net 200 ml   Physical Exam: General:  No acute distress, laying in the bed  HEENT  anicteric, moist oral mucous membrane  Pulm/lungs  normal breathing effort, room air,   CVS/Heart  regular rhythm, no rub or gallop  Abdomen:   Soft, nontender  Extremities:  Trace peripheral edema  Neurologic:  Alert, oriented, able to follow commands  Skin:  No acute rashes  PD catheter in place     Basic Metabolic Panel:  Recent Labs  Lab 08/06/20 1804 08/06/20 1929 08/07/20 0423 08/08/20 0414  NA 140  --  140 140  K 4.5  --  3.7 3.7  CL 106  --  108 107  CO2 25  --  23 24  GLUCOSE 132*  --  92 159*  BUN 41*  --   43* 42*  CREATININE 6.51*  --  6.70* 6.62*  CALCIUM 8.3*  --  8.2* 8.3*  MG  --  2.2  --   --      CBC: Recent Labs  Lab 08/06/20 1804  WBC 10.3  HGB 10.7*  HCT 31.6*  MCV 93.5  PLT 188     No results found for: HEPBSAG, HEPBSAB, HEPBIGM    Microbiology:  Recent Results (from the past 240 hour(s))  Resp Panel by RT-PCR (Flu A&B, Covid) Nasopharyngeal Swab     Status: None   Collection Time: 08/06/20  7:29 PM   Specimen: Nasopharyngeal Swab; Nasopharyngeal(NP) swabs in vial transport medium  Result Value Ref Range Status   SARS Coronavirus 2 by RT PCR NEGATIVE NEGATIVE Final    Comment: (NOTE) SARS-CoV-2 target nucleic acids are NOT DETECTED.  The SARS-CoV-2 RNA is generally detectable in upper respiratory specimens during the acute phase of infection. The lowest concentration of SARS-CoV-2 viral copies this assay can detect is 138 copies/mL. A negative result does not preclude SARS-Cov-2 infection and should not be used as the sole basis for treatment or other patient management decisions. A negative result may occur with  improper specimen collection/handling, submission of specimen other than nasopharyngeal swab, presence of viral mutation(s) within the areas  targeted by this assay, and inadequate number of viral copies(<138 copies/mL). A negative result must be combined with clinical observations, patient history, and epidemiological information. The expected result is Negative.  Fact Sheet for Patients:  EntrepreneurPulse.com.au  Fact Sheet for Healthcare Providers:  IncredibleEmployment.be  This test is no t yet approved or cleared by the [place] FDA and  has been authorized for detection and/or diagnosis of SARS-CoV-2 by FDA under an Emergency Use Authorization (EUA). This EUA will remain  in effect (meaning this test can be used) for the duration of the COVID-19 declaration under Section 564(b)(1) of the Act,  21 U.S.C.section 360bbb-3(b)(1), unless the authorization is terminated  or revoked sooner.       Influenza A by PCR NEGATIVE NEGATIVE Final   Influenza B by PCR NEGATIVE NEGATIVE Final    Comment: (NOTE) The Xpert Xpress SARS-CoV-2/FLU/RSV plus assay is intended as an aid in the diagnosis of influenza from Nasopharyngeal swab specimens and should not be used as a sole basis for treatment. Nasal washings and aspirates are unacceptable for Xpert Xpress SARS-CoV-2/FLU/RSV testing.  Fact Sheet for Patients: EntrepreneurPulse.com.au  Fact Sheet for Healthcare Providers: IncredibleEmployment.be  This test is not yet approved or cleared by the [place] FDA and has been authorized for detection and/or diagnosis of SARS-CoV-2 by FDA under an Emergency Use Authorization (EUA). This EUA will remain in effect (meaning this test can be used) for the duration of the COVID-19 declaration under Section 564(b)(1) of the Act, 21 U.S.C. section 360bbb-3(b)(1), unless the authorization is terminated or revoked.  Performed at [facility], [address] [place], [place] [address]     Coagulation Studies: No results for input(s): LABPROT, INR in the last 72 hours.  Urinalysis: No results for input(s): COLORURINE, LABSPEC, PHURINE, GLUCOSEU, HGBUR, BILIRUBINUR, KETONESUR, PROTEINUR, UROBILINOGEN, NITRITE, LEUKOCYTESUR in the last 72 hours.  Invalid input(s): APPERANCEUR    Imaging: DG Chest 2 View  Result Date: 08/07/2020 CLINICAL DATA:  Shortness of breath for 1 week EXAM: CHEST - 2 VIEW COMPARISON:  08/06/2020 FINDINGS: Stable mild cardiomegaly. Atherosclerotic calcification of the aortic knob. Chronic coarsened interstitial markings. No new focal airspace consolidation. No pleural effusion or pneumothorax. IMPRESSION: No active cardiopulmonary disease. Electronically Signed   By: Davina Poke D.O.   On: 08/07/2020 12:00   DG Chest 2  View  Result Date: 08/06/2020 CLINICAL DATA:  Shortness of breath. EXAM: CHEST - 2 VIEW COMPARISON:  May 11, 2020 FINDINGS: The lungs are hyperinflated. Chronic appearing increased lung markings are seen. Mild blunting of the left costophrenic angle is noted. There is no evidence of a pneumothorax. The cardiac silhouette is mildly enlarged. Moderate severity calcification of the aortic arch is seen. The visualized skeletal structures are unremarkable. IMPRESSION: Stable cardiomegaly with a very small left pleural effusion versus pleural thickening. Electronically Signed   By: Virgina Norfolk M.D.   On: 08/06/2020 18:35   DG Abd 1 View  Result Date: 08/07/2020 CLINICAL DATA:  Evaluate peritoneal dialysis catheter EXAM: ABDOMEN - 1 VIEW COMPARISON:  12/11/2012 FINDINGS: Peritoneal dialysis catheter is coiled within the pelvis, to the right of midline. The bowel gas pattern is normal. No radio-opaque calculi or other significant radiographic abnormality are seen. Surgical clips in the left hemiabdomen. IMPRESSION: 1. Nonobstructive bowel gas pattern. 2. Peritoneal dialysis catheter is coiled within the pelvis, to the right of midline. Electronically Signed   By: Davina Poke D.O.   On: 08/07/2020 12:13     Medications:   .  sodium chloride    . dialysis solution 2.5% low-MG/low-CA     . amLODipine  10 mg Oral QPM  . aspirin EC  81 mg Oral Daily  . carvedilol  25 mg Oral BID WC  . cloNIDine  0.2 mg Oral TID  . clopidogrel  75 mg Oral Daily  . doxazosin  4 mg Oral BID  . ezetimibe  10 mg Oral Daily  . furosemide  80 mg Intravenous Daily  . gentamicin cream  1 application Topical Daily  . heparin  5,000 Units Subcutaneous Q8H  . insulin aspart  0-5 Units Subcutaneous QHS  . insulin aspart  0-9 Units Subcutaneous TID WC  . irbesartan  150 mg Oral Daily  . rosuvastatin  20 mg Oral QHS  . sodium chloride flush  3 mL Intravenous Q12H   sodium chloride, acetaminophen, diphenhydrAMINE,  heparin, ondansetron (ZOFRAN) IV, sodium chloride flush  Assessment/ Plan:  75 y.o. male with coronary artery disease, systolic CHF with an EF of 45% from December 2021, end-stage renal disease, hypertension, cardiovascular disease, sleep apnea, history of renal cell carcinoma status post left partial nephrectomy, history of stroke was admitted on 08/06/2020 for  Principal Problem:   ESRD on peritoneal dialysis [facility]) Active Problems:   Prostate cancer ([place])   Coronary artery disease involving native coronary artery of native heart without angina pectoris   Hypertensive urgency   Acute on chronic systolic congestive heart failure (HCC)   Fluid overload   Anemia of chronic disease  ESRD (end stage renal disease) ([place]) [N18.6] Fluid overload [E87.70] Other hypervolemia [E87.79]  Home PD regimen of fill volume 2000 cc x 3 cycles  #. ESRD Patient reports fullness with 2000 cc volume therefore will reduce to 1500.  -Increase number of cycles to 4. -Change diuretic to torsemide -X-ray abdomen for PD catheter placement -Repeat chest x-ray  #Hypertension, severe, shortness of breath   BP Readings from Last 2 Encounters:  08/08/20 (!) 171/81  07/31/20 (!) 180/70   Severe hypertension and volume overload of likely causing patient's shortness of breath. -Volume control has improved with IV Lasix -Patient tolerated irbesartan well.  Will increase the dose to 300 mg her blood pressure is still elevated. -Previously hydralazine and Imdur has caused headache therefore patient is not willing to try it again  -Dose amlodipine in the evening   #. Anemia of CKD  Lab Results  Component Value Date   HGB 10.7 (L) 08/06/2020   Monitor during hospitalization  #. Secondary hyperparathyroidism of renal origin N 25.81      Component Value Date/Time   PTH 315 (H) 05/11/2017 1450   No results found for: PHOS Monitor calcium and phos level during this admission     LOS: [place] 3/18/202212:56 PM  [place], [place]

## 2020-08-08 NOTE — Progress Notes (Signed)
PROGRESS NOTE    [name]   WVP:[id number]  DOB: August 13, 1945  DOA: 08/06/2020 PCP: Crecencio Mc, MD   Brief Narrative:  [name]  is a 75 y.o. male with medical history significant for CAD, systolic heart failure, last EF 40 to 45% December 2021, ESRD on PD, hypertension, peripheral arterial disease, sleep apnea, renal cell carcinoma status post left heminephrectomy,history of CVA who presents to the emergency room with a 2-week history of progressively worsening shortness of breath, now associated with orthopnea, lower extremity edema and weight gain of several pounds in the last few days. He has also has very uncontrolled BP despite many medication changes as outpatient.    Subjective: No new complaints.     Assessment & Plan:   Principal Problem:   ESRD on peritoneal dialysis /  Acute on chronic systolic congestive heart failure ([place]) - has improved somewhat with Lasix but not enough - dialysis catheter became clotted overnight last night- have notified renal - renal has increases the exchanges and decrease the volume- - weight has improved today showing progress- Cr still elevated at 6.62 -  cont to follow 1 more day  Active Problems:  h/o CAD - no c/o chest pain - cont, statin Plavix and ASA  Nicotine abuse - counseled to quit    Hypertensive urgency - resume home meds- remove excess fluid- PRN Labetalol given - Ibesartan and Demedex have been ordered    Anemia of chronic disease - Hb 10-11 range- follow  DM2 - A1c is 6.3- apparently is diet controlled as outpt  Time spent in minutes: 35 DVT prophylaxis: heparin injection 5,000 Units Start: 08/06/20 2200  Code Status: Full code Family Communication: daughter at bedside Level of Care: Level of care: Progressive Cardiac Disposition Plan:  Status is: Inpatient  Remains inpatient appropriate because:IV treatments appropriate due to intensity of illness or inability to take PO   Dispo: The  patient is from: Home              Anticipated d/c is to: TBD              Patient currently is not medically stable to d/c.   Difficult to place patient No      Consultants:   Nephrology  Cardiology Procedures:   none Antimicrobials:  Anti-infectives (From admission, onward)   None       Objective: Vitals:   08/08/20 0001 08/08/20 0338 08/08/20 0753 08/08/20 1159  BP: (!) 192/69 (!) 190/87 (!) 194/75 (!) 171/81  Pulse: 60 (!) 57 60 (!) 56  Resp: 20 18 16 16   Temp: 98.3 F (36.8 C) 98.3 F (36.8 C) 98.6 F (37 C) (!) 97.5 F (36.4 C)  TempSrc: Oral Oral Oral Oral  SpO2: 95% 98% 96% 95%  Weight:  66.6 kg    Height:        Intake/Output Summary (Last 24 hours) at 08/08/2020 1313 Last data filed at 08/08/2020 0945 Gross per 24 hour  Intake 800 ml  Output 600 ml  Net 200 ml   Filed Weights   08/06/20 2127 08/07/20 0427 08/08/20 0338  Weight: 68.9 kg 69 kg 66.6 kg    Examination: General exam: Appears comfortable  HEENT: PERRLA, oral mucosa moist, no sclera icterus or thrush Respiratory system: crackles at bases Respiratory effort normal. Cardiovascular system: S1 & S2 heard, RRR.   Gastrointestinal system: Abdomen soft, non-tender, nondistended. Normal bowel sounds. Central nervous system: Alert and oriented. No focal neurological deficits.  Extremities: No cyanosis, clubbing or edema Skin: No rashes or ulcers Psychiatry:  Mood & affect appropriate.     Data Reviewed: I have personally reviewed following labs and imaging studies  CBC: Recent Labs  Lab 08/06/20 1804  WBC 10.3  HGB 10.7*  HCT 31.6*  MCV 93.5  PLT 616   Basic Metabolic Panel: Recent Labs  Lab 08/06/20 1804 08/06/20 1929 08/07/20 0423 08/08/20 0414  NA 140  --  140 140  K 4.5  --  3.7 3.7  CL 106  --  108 107  CO2 25  --  23 24  GLUCOSE 132*  --  92 159*  BUN 41*  --  43* 42*  CREATININE 6.51*  --  6.70* 6.62*  CALCIUM 8.3*  --  8.2* 8.3*  MG  --  2.2  --   --     GFR: Estimated Creatinine Clearance: 8.8 mL/min (A) (by C-G formula based on SCr of 6.62 mg/dL (H)). Liver Function Tests: No results for input(s): AST, ALT, ALKPHOS, BILITOT, PROT, ALBUMIN in the last 168 hours. No results for input(s): LIPASE, AMYLASE in the last 168 hours. No results for input(s): AMMONIA in the last 168 hours. Coagulation Profile: No results for input(s): INR, PROTIME in the last 168 hours. Cardiac Enzymes: No results for input(s): CKTOTAL, CKMB, CKMBINDEX, TROPONINI in the last 168 hours. BNP (last 3 results) No results for input(s): PROBNP in the last 8760 hours. HbA1C: Recent Labs    08/06/20 2126  HGBA1C 6.3*   CBG: Recent Labs  Lab 08/07/20 1625 08/07/20 1642 08/07/20 2041 08/08/20 0754 08/08/20 1204  GLUCAP 55* 100* 191* 102* 146*   Lipid Profile: No results for input(s): CHOL, HDL, LDLCALC, TRIG, CHOLHDL, LDLDIRECT in the last 72 hours. Thyroid Function Tests: No results for input(s): TSH, T4TOTAL, FREET4, T3FREE, THYROIDAB in the last 72 hours. Anemia Panel: No results for input(s): VITAMINB12, FOLATE, FERRITIN, TIBC, IRON, RETICCTPCT in the last 72 hours. Urine analysis:    Component Value Date/Time   COLORURINE YELLOW 01/21/2020 [place] 01/21/2020 1223   APPEARANCEUR Clear 01/03/2012 1117   LABSPEC 1.010 01/21/2020 1223   LABSPEC 1.004 01/03/2012 1117   PHURINE 7.0 01/21/2020 1223   GLUCOSEU 100 (A) 01/21/2020 1223   HGBUR TRACE-INTACT (A) 01/21/2020 1223   BILIRUBINUR NEGATIVE 01/21/2020 1223   BILIRUBINUR neg 07/30/2013 1127   BILIRUBINUR Negative 01/03/2012 [place] 01/21/2020 1223   PROTEINUR 100 07/30/2013 1127   PROTEINUR Negative 01/03/2012 1117   PROTEINUR 30 (A) 06/30/2011 0822   UROBILINOGEN 0.2 01/21/2020 1223   NITRITE NEGATIVE 01/21/2020 1223   LEUKOCYTESUR NEGATIVE 01/21/2020 1223   LEUKOCYTESUR Negative 01/03/2012 1117   Sepsis  Labs: @LABRCNTIP (procalcitonin:4,lacticidven:4) ) Recent Results (from the past 240 hour(s))  Resp Panel by RT-PCR (Flu A&B, Covid) Nasopharyngeal Swab     Status: None   Collection Time: 08/06/20  7:29 PM   Specimen: Nasopharyngeal Swab; Nasopharyngeal(NP) swabs in vial transport medium  Result Value Ref Range Status   SARS Coronavirus 2 by RT PCR NEGATIVE NEGATIVE Final    Comment: (NOTE) SARS-CoV-2 target nucleic acids are NOT DETECTED.  The SARS-CoV-2 RNA is generally detectable in upper respiratory specimens during the acute phase of infection. The lowest concentration of SARS-CoV-2 viral copies this assay can detect is 138 copies/mL. A negative result does not preclude SARS-Cov-2 infection and should not be used as the sole basis for treatment or other patient management decisions. A negative result may occur  with  improper specimen collection/handling, submission of specimen other than nasopharyngeal swab, presence of viral mutation(s) within the areas targeted by this assay, and inadequate number of viral copies(<138 copies/mL). A negative result must be combined with clinical observations, patient history, and epidemiological information. The expected result is Negative.  Fact Sheet for Patients:  EntrepreneurPulse.com.au  Fact Sheet for Healthcare Providers:  IncredibleEmployment.be  This test is no t yet approved or cleared by the [place] FDA and  has been authorized for detection and/or diagnosis of SARS-CoV-2 by FDA under an Emergency Use Authorization (EUA). This EUA will remain  in effect (meaning this test can be used) for the duration of the COVID-19 declaration under Section 564(b)(1) of the Act, 21 U.S.C.section 360bbb-3(b)(1), unless the authorization is terminated  or revoked sooner.       Influenza A by PCR NEGATIVE NEGATIVE Final   Influenza B by PCR NEGATIVE NEGATIVE Final    Comment: (NOTE) The Xpert Xpress  SARS-CoV-2/FLU/RSV plus assay is intended as an aid in the diagnosis of influenza from Nasopharyngeal swab specimens and should not be used as a sole basis for treatment. Nasal washings and aspirates are unacceptable for Xpert Xpress SARS-CoV-2/FLU/RSV testing.  Fact Sheet for Patients: EntrepreneurPulse.com.au  Fact Sheet for Healthcare Providers: IncredibleEmployment.be  This test is not yet approved or cleared by the [place] FDA and has been authorized for detection and/or diagnosis of SARS-CoV-2 by FDA under an Emergency Use Authorization (EUA). This EUA will remain in effect (meaning this test can be used) for the duration of the COVID-19 declaration under Section 564(b)(1) of the Act, 21 U.S.C. section 360bbb-3(b)(1), unless the authorization is terminated or revoked.  Performed at [facility], [address], [place], [place] [address]          Radiology Studies: DG Chest 2 View  Result Date: 08/07/2020 CLINICAL DATA:  Shortness of breath for 1 week EXAM: CHEST - 2 VIEW COMPARISON:  08/06/2020 FINDINGS: Stable mild cardiomegaly. Atherosclerotic calcification of the aortic knob. Chronic coarsened interstitial markings. No new focal airspace consolidation. No pleural effusion or pneumothorax. IMPRESSION: No active cardiopulmonary disease. Electronically Signed   By: Davina Poke D.O.   On: 08/07/2020 12:00   DG Chest 2 View  Result Date: 08/06/2020 CLINICAL DATA:  Shortness of breath. EXAM: CHEST - 2 VIEW COMPARISON:  May 11, 2020 FINDINGS: The lungs are hyperinflated. Chronic appearing increased lung markings are seen. Mild blunting of the left costophrenic angle is noted. There is no evidence of a pneumothorax. The cardiac silhouette is mildly enlarged. Moderate severity calcification of the aortic arch is seen. The visualized skeletal structures are unremarkable. IMPRESSION: Stable cardiomegaly with a very small  left pleural effusion versus pleural thickening. Electronically Signed   By: Virgina Norfolk M.D.   On: 08/06/2020 18:35   DG Abd 1 View  Result Date: 08/07/2020 CLINICAL DATA:  Evaluate peritoneal dialysis catheter EXAM: ABDOMEN - 1 VIEW COMPARISON:  12/11/2012 FINDINGS: Peritoneal dialysis catheter is coiled within the pelvis, to the right of midline. The bowel gas pattern is normal. No radio-opaque calculi or other significant radiographic abnormality are seen. Surgical clips in the left hemiabdomen. IMPRESSION: 1. Nonobstructive bowel gas pattern. 2. Peritoneal dialysis catheter is coiled within the pelvis, to the right of midline. Electronically Signed   By: Davina Poke D.O.   On: 08/07/2020 12:13      Scheduled Meds: . amLODipine  10 mg Oral QPM  . aspirin EC  81 mg Oral Daily  .  carvedilol  25 mg Oral BID WC  . cloNIDine  0.2 mg Oral TID  . clopidogrel  75 mg Oral Daily  . doxazosin  4 mg Oral BID  . ezetimibe  10 mg Oral Daily  . gentamicin cream  1 application Topical Daily  . heparin  5,000 Units Subcutaneous Q8H  . insulin aspart  0-5 Units Subcutaneous QHS  . insulin aspart  0-9 Units Subcutaneous TID WC  . [START ON 08/09/2020] irbesartan  300 mg Oral Daily  . rosuvastatin  20 mg Oral QHS  . sodium chloride flush  3 mL Intravenous Q12H  . [START ON 08/09/2020] torsemide  40 mg Oral Daily   Continuous Infusions: . sodium chloride    . dialysis solution 2.5% low-MG/low-CA       LOS: 2 days      Debbe Odea, MD [name] Pager: [email or web address] 08/08/2020, 1:13 PM

## 2020-08-08 NOTE — Progress Notes (Signed)
Progress Note  Patient Name: [name] Date of Encounter: 08/08/2020  Primary Cardiologist: End  Subjective   BP remains poorly controlled in the 180s to low 578I mmHg systolic. No chest pain or dyspnea.   Inpatient Medications    Scheduled Meds: . amLODipine  10 mg Oral QPM  . aspirin EC  81 mg Oral Daily  . carvedilol  25 mg Oral BID WC  . cloNIDine  0.2 mg Oral TID  . clopidogrel  75 mg Oral Daily  . doxazosin  4 mg Oral BID  . ezetimibe  10 mg Oral Daily  . gentamicin cream  1 application Topical Daily  . heparin  5,000 Units Subcutaneous Q8H  . insulin aspart  0-5 Units Subcutaneous QHS  . insulin aspart  0-9 Units Subcutaneous TID WC  . irbesartan  150 mg Oral Daily  . rosuvastatin  20 mg Oral QHS  . sodium chloride flush  3 mL Intravenous Q12H   Continuous Infusions: . sodium chloride    . dialysis solution 2.5% low-MG/low-CA     PRN Meds: sodium chloride, acetaminophen, diphenhydrAMINE, heparin, ondansetron (ZOFRAN) IV, sodium chloride flush   Vital Signs    Vitals:   08/07/20 1958 08/08/20 0001 08/08/20 0338 08/08/20 0753  BP: (!) 210/74 (!) 192/69 (!) 190/87 (!) 194/75  Pulse: 60 60 (!) 57 60  Resp: 20 20 18 16   Temp: 97.9 F (36.6 C) 98.3 F (36.8 C) 98.3 F (36.8 C) 98.6 F (37 C)  TempSrc: Oral Oral Oral Oral  SpO2: 99% 95% 98% 96%  Weight:   66.6 kg   Height:        Intake/Output Summary (Last 24 hours) at 08/08/2020 0846 Last data filed at 08/08/2020 0338 Gross per 24 hour  Intake 1040 ml  Output 600 ml  Net 440 ml   Filed Weights   08/06/20 2127 08/07/20 0427 08/08/20 0338  Weight: 68.9 kg 69 kg 66.6 kg    Telemetry    SR with PACs/PVCs - Personally Reviewed  ECG    No new tracings - Personally Reviewed  Physical Exam   GEN: No acute distress.   Neck: JVD elevated ~ 10 cm. Cardiac: RRR, no murmurs, rubs, or gallops.  Respiratory: Clear to auscultation bilaterally.  GI: Soft, nontender, non-distended.   MS: Trace  pretibial edema bilaterally; No deformity. Neuro:  Alert and oriented x 3; Nonfocal.  Psych: Normal affect.  Labs    Chemistry Recent Labs  Lab 08/06/20 1804 08/07/20 0423 08/08/20 0414  NA 140 140 140  K 4.5 3.7 3.7  CL 106 108 107  CO2 25 23 24   GLUCOSE 132* 92 159*  BUN 41* 43* 42*  CREATININE 6.51* 6.70* 6.62*  CALCIUM 8.3* 8.2* 8.3*  GFRNONAA 8* 8* 8*  ANIONGAP 9 9 9      Hematology Recent Labs  Lab 08/06/20 1804  WBC 10.3  RBC 3.38*  HGB 10.7*  HCT 31.6*  MCV 93.5  MCH 31.7  MCHC 33.9  RDW 15.5  PLT 188    Cardiac EnzymesNo results for input(s): TROPONINI in the last 168 hours. No results for input(s): TROPIPOC in the last 168 hours.   BNP Recent Labs  Lab 08/06/20 1804  BNP 1,785.3*     DDimer No results for input(s): DDIMER in the last 168 hours.   Radiology    DG Chest 2 View  Result Date: 08/07/2020 IMPRESSION: No active cardiopulmonary disease. Electronically Signed   By: Davina Poke D.O.  On: 08/07/2020 12:00   DG Chest 2 View  Result Date: 08/06/2020 IMPRESSION: Stable cardiomegaly with a very small left pleural effusion versus pleural thickening. Electronically Signed   By: Virgina Norfolk M.D.   On: 08/06/2020 18:35   DG Abd 1 View  Result Date: 08/07/2020 IMPRESSION: 1. Nonobstructive bowel gas pattern. 2. Peritoneal dialysis catheter is coiled within the pelvis, to the right of midline. Electronically Signed   By: Davina Poke D.O.   On: 08/07/2020 12:13    Cardiac Studies   2D echo 04/2020: 1. Left ventricular ejection fraction, by estimation, is 40 to 45%. The  left ventricle has mildly decreased function. The left ventricle has no  regional wall motion abnormalities. The left ventricular internal cavity  size was mildly dilated. There is  moderate left ventricular hypertrophy. Left ventricular diastolic  parameters are consistent with Grade I diastolic dysfunction (impaired  relaxation). The average left  ventricular global longitudinal strain is  -13.7 %. The global longitudinal strain is  abnormal.  2. Right ventricular systolic function is normal. The right ventricular  size is normal. Tricuspid regurgitation signal is inadequate for assessing  PA pressure.  3. Left atrial size was moderately dilated.  4. The mitral valve is normal in structure. Mild mitral valve  regurgitation. No evidence of mitral stenosis.  5. The aortic valve is normal in structure. Aortic valve regurgitation is  mild. Mild to moderate aortic valve sclerosis/calcification is present,  without any evidence of aortic stenosis.  6. Aortic dilatation noted. There is mild dilatation of the aortic root,  measuring 39 mm.  7. The inferior vena cava is dilated in size with >50% respiratory  variability, suggesting right atrial pressure of 8 mmHg. __________  Elwyn Reach patch 01/2020:  The patient was monitored for 14 days.  The predominant rhythm was sinus with an average rate of 74 bpm (range 47-115 bpm in sinus).  There were rare PACs and occasional PVCs.  Two episodes of nonsustained ventricular tachycardia lasting up to 6 beats with a maximum rate of 182 bpm occurred.  There were 28 atrial runs lasting up to 13 beats with a maximum rate of 169 bpm.  No sustained arrhythmia or prolonged pause was identified.  There were no patient triggered events.  Predominately sinus rhythm with rare PACs and occasional PVCs. Brief NSVT and PSVT also noted. __________  2D echo 12/2019: 1. Left ventricular ejection fraction, by estimation, is 40 to 45%. The  left ventricle has mildly decreased function. The left ventricle  demonstrates global hypokinesis. There is moderate left ventricular  hypertrophy. Left ventricular diastolic  parameters are consistent with Grade I diastolic dysfunction (impaired  relaxation). Elevated left atrial pressure.  2. Right ventricular systolic function is normal. The right ventricular   size is normal. Mildly increased right ventricular wall thickness.  3. Left atrial size was severely dilated.  4. The mitral valve is degenerative. Mild mitral valve regurgitation. No  evidence of mitral stenosis.  5. Unable to determine valve morphology due to image quality. Aortic  valve regurgitation is mild. Mild to moderate aortic valve  sclerosis/calcification is present, without any evidence of aortic  stenosis.  6. Aortic dilatation noted. There is mild dilatation of the ascending  aorta measuring 37 mm.  7. The inferior vena cava is normal in size with greater than 50%  respiratory variability, suggesting right atrial pressure of 3 mmHg.  8. Agitated saline contrast bubble study was negative, with no evidence  of any interatrial shunt. __________  2D echo 12/2018:1. The left ventricle has mild-moderately reduced systolic function, with  an ejection fraction of 40-45%. The cavity size was normal. There is mild  to moderately increased left ventricular wall thickness. Left ventricular  diffuse hypokinesis.  2. The right ventricle has normal systolic function. The cavity was  normal. There is no increase in right ventricular wall thickness. Unable  to estimate RSVP  3. Left atrial size was mild-moderately dilated.  4. Aortic valve regurgitation is mild  5. Mitral valve regurgitation is mild to moderate  6. There is dilatation of the aortic root 3.7 cm and of the ascending  aorta 3.7 cm.  7. Sinus bradycardia noted , rate down to 51 bpm  Patient Profile     75 y.o. male with history of HFrEF uncertain etiology to date, ESRD on PD, CVA, DM2, HTN, HLD, LVH, mildly dilated aortic root, PAD, tobacco use, RCC status post left heminephrectomy, and adenocarcinoma of the appendix who is being seen today for the evaluation of cardiomyopathy at the request of Dr. Damita Dunnings.  Assessment & Plan    1. Chronic dyspnea/HFrEF/hypertensive urgency: -In the past, as recently as  07/31/2020, he has deferred proceeding with diagnostic R/LHC, this can be readdressed in the outpatient setting  -High-sensitivity troponin negative x2 this admission -Suspect his symptoms of chronic dyspnea are multifactorial including constellation of hypertensive urgency in the setting of ESRD with known HFrEF leading to hypertensive heart disease and volume overload -This is somewhat of a difficult situation to manage moving forward given his intolerance to Imdur/hydralazine with headaches making BiDil unavailable  -BP remains elevated significantly elevated in the 180s to 165 mmHg systolic -He does consume a diet high in sodium at home, though this has been improved while admitted and BP remains elevated  -Current regimen includes amlodipine 10 mg Coreg 25 mg bid, clonidine 0.2 mg tid, Cardura 4 mg bid, irbesartan 150 mg -Resume IV Lasix 80 mg daily (makes a good amount of urine), may need to escalate therapy further  -With continued fluid removal, hopefully BP will improve   2. ESRD on PD: -Per nephrology   3. Anemia of chronic disease: -Stable   4.  History of CVA: -Cardiac monitoring has demonstrated no objective evidence of A. Fib -Remains on aspirin and Plavix  5.  HLD: -PTA Crestor -Target LDL less than 70   For questions or updates, please contact [place] Please consult [email or web address] for contact info under Cardiology/STEMI.    Signed, Christell Faith, PA-C [place] Pager: [phone] 08/08/2020, 8:46 AM

## 2020-08-08 NOTE — Care Management Important Message (Signed)
Important Message  Patient Details  Name: [name] MRN: [id number] Date of Birth: 1946-04-07   Medicare Important Message Given:  Yes     Dannette Barbara 08/08/2020, 11:03 AM

## 2020-08-09 DIAGNOSIS — E8779 Other fluid overload: Secondary | ICD-10-CM

## 2020-08-09 DIAGNOSIS — Z716 Tobacco abuse counseling: Secondary | ICD-10-CM

## 2020-08-09 LAB — BASIC METABOLIC PANEL
Anion gap: 7 (ref 5–15)
BUN: 38 mg/dL — ABNORMAL HIGH (ref 8–23)
CO2: 25 mmol/L (ref 22–32)
Calcium: 8 mg/dL — ABNORMAL LOW (ref 8.9–10.3)
Chloride: 106 mmol/L (ref 98–111)
Creatinine, Ser: 6.44 mg/dL — ABNORMAL HIGH (ref 0.61–1.24)
GFR, Estimated: 8 mL/min — ABNORMAL LOW (ref >60)
Glucose, Bld: 153 mg/dL — ABNORMAL HIGH (ref 70–99)
Potassium: 3.5 mmol/L (ref 3.5–5.1)
Sodium: 138 mmol/L (ref 135–145)

## 2020-08-09 LAB — CBC
HCT: 31.6 % — ABNORMAL LOW (ref 39.0–52.0)
Hemoglobin: 10.9 g/dL — ABNORMAL LOW (ref 13.0–17.0)
MCH: 31.5 pg (ref 26.0–34.0)
MCHC: 34.5 g/dL (ref 30.0–36.0)
MCV: 91.3 fL (ref 80.0–100.0)
Platelets: 163 10*3/uL (ref 150–400)
RBC: 3.46 MIL/uL — ABNORMAL LOW (ref 4.22–5.81)
RDW: 15.5 % (ref 11.5–15.5)
WBC: 7.8 10*3/uL (ref 4.0–10.5)
nRBC: 0 % (ref 0.0–0.2)

## 2020-08-09 LAB — GLUCOSE, CAPILLARY
Glucose-Capillary: 139 mg/dL — ABNORMAL HIGH (ref 70–99)
Glucose-Capillary: 158 mg/dL — ABNORMAL HIGH (ref 70–99)

## 2020-08-09 LAB — PHOSPHORUS: Phosphorus: 5.5 mg/dL — ABNORMAL HIGH (ref 2.5–4.6)

## 2020-08-09 MED ORDER — TORSEMIDE 40 MG PO TABS: 40.0000 mg | ORAL_TABLET | Freq: Every day | ORAL | 0 refills | Status: DC

## 2020-08-09 MED ORDER — CLONIDINE HCL 0.1 MG PO TABS
0.3000 mg | ORAL_TABLET | Freq: Three times a day (TID) | ORAL | Status: DC
  Administered 2020-08-09: 0.3 mg via ORAL
  Filled 2020-08-09: qty 3

## 2020-08-09 MED ORDER — IRBESARTAN 300 MG PO TABS: 300.0000 mg | ORAL_TABLET | Freq: Every day | ORAL | 0 refills | Status: DC

## 2020-08-09 NOTE — Progress Notes (Signed)
Patient discharged to home. Tele and IV d/c'd. Patient verbalizes understanding of discharge instructions. 

## 2020-08-09 NOTE — Plan of Care (Signed)
  Problem: Clinical Measurements: Goal: Cardiovascular complication will be avoided Outcome: Not Progressing Note: Blood pressure remains elevated. PD completed without any complications. Weight documented. Patient has disconnected PD tubing.

## 2020-08-09 NOTE — Progress Notes (Signed)
[facility], [place] 08/09/20  Subjective:   LOS: 3  Patient sitting up in bed, reports SOB is much better now. He is on PO Torsemide now. No nausea, vomiting, abdominal pain. Patient still has wet cough, states it's improving.  Objective:  Vital signs in last 24 hours:  Temp:  [97.5 F (36.4 C)-98.1 F (36.7 C)] 98.1 F (36.7 C) (03/19 1139) Pulse Rate:  [52-66] 52 (03/19 1139) Resp:  [16-18] 16 (03/19 1139) BP: (158-186)/(60-76) 158/60 (03/19 1139) SpO2:  [95 %-98 %] 98 % (03/19 1139) Weight:  [63.6 kg] 63.6 kg (03/19 0500)  Weight change: -3.006 kg Filed Weights   08/07/20 0427 08/08/20 0338 08/09/20 0500  Weight: 69 kg 66.6 kg 63.6 kg    Intake/Output:    Intake/Output Summary (Last 24 hours) at 08/09/2020 1648 Last data filed at 08/09/2020 1103 Gross per 24 hour  Intake 240 ml  Output 750 ml  Net -510 ml   Physical Exam: General:  Awake,alert, in no acute distress  HEENT   moist oral mucous membrane  Pulm/lungs  lungs clear bilaterally,normal respiratory  effort  CVS/Heart  regular rate and rhythm, no rub or gallop  Abdomen:   Soft, non tender,non distended  Extremities:  No  peripheral edema  Neurologic:  Oriented x 3  Skin:  No acute rashes or lesions  PD catheter in place     Basic Metabolic Panel:  Recent Labs  Lab 08/06/20 1804 08/06/20 1929 08/07/20 0423 08/08/20 0414 08/09/20 0441  NA 140  --  140 140 138  K 4.5  --  3.7 3.7 3.5  CL 106  --  108 107 106  CO2 25  --  23 24 25   GLUCOSE 132*  --  92 159* 153*  BUN 41*  --  43* 42* 38*  CREATININE 6.51*  --  6.70* 6.62* 6.44*  CALCIUM 8.3*  --  8.2* 8.3* 8.0*  MG  --  2.2  --   --   --   PHOS  --   --   --   --  5.5*     CBC: Recent Labs  Lab 08/06/20 1804 08/09/20 0441  WBC 10.3 7.8  HGB 10.7* 10.9*  HCT 31.6* 31.6*  MCV 93.5 91.3  PLT 188 163     No results found for: HEPBSAG, HEPBSAB, HEPBIGM    Microbiology:  Recent Results (from the past  240 hour(s))  Resp Panel by RT-PCR (Flu A&B, Covid) Nasopharyngeal Swab     Status: None   Collection Time: 08/06/20  7:29 PM   Specimen: Nasopharyngeal Swab; Nasopharyngeal(NP) swabs in vial transport medium  Result Value Ref Range Status   SARS Coronavirus 2 by RT PCR NEGATIVE NEGATIVE Final    Comment: (NOTE) SARS-CoV-2 target nucleic acids are NOT DETECTED.  The SARS-CoV-2 RNA is generally detectable in upper respiratory specimens during the acute phase of infection. The lowest concentration of SARS-CoV-2 viral copies this assay can detect is 138 copies/mL. A negative result does not preclude SARS-Cov-2 infection and should not be used as the sole basis for treatment or other patient management decisions. A negative result may occur with  improper specimen collection/handling, submission of specimen other than nasopharyngeal swab, presence of viral mutation(s) within the areas targeted by this assay, and inadequate number of viral copies(<138 copies/mL). A negative result must be combined with clinical observations, patient history, and epidemiological information. The expected result is Negative.  Fact Sheet for Patients:  EntrepreneurPulse.com.au  Fact  Sheet for Healthcare Providers:  IncredibleEmployment.be  This test is no t yet approved or cleared by the [place] FDA and  has been authorized for detection and/or diagnosis of SARS-CoV-2 by FDA under an Emergency Use Authorization (EUA). This EUA will remain  in effect (meaning this test can be used) for the duration of the COVID-19 declaration under Section 564(b)(1) of the Act, 21 U.S.C.section 360bbb-3(b)(1), unless the authorization is terminated  or revoked sooner.       Influenza A by PCR NEGATIVE NEGATIVE Final   Influenza B by PCR NEGATIVE NEGATIVE Final    Comment: (NOTE) The Xpert Xpress SARS-CoV-2/FLU/RSV plus assay is intended as an aid in the diagnosis of influenza  from Nasopharyngeal swab specimens and should not be used as a sole basis for treatment. Nasal washings and aspirates are unacceptable for Xpert Xpress SARS-CoV-2/FLU/RSV testing.  Fact Sheet for Patients: EntrepreneurPulse.com.au  Fact Sheet for Healthcare Providers: IncredibleEmployment.be  This test is not yet approved or cleared by the [place] FDA and has been authorized for detection and/or diagnosis of SARS-CoV-2 by FDA under an Emergency Use Authorization (EUA). This EUA will remain in effect (meaning this test can be used) for the duration of the COVID-19 declaration under Section 564(b)(1) of the Act, 21 U.S.C. section 360bbb-3(b)(1), unless the authorization is terminated or revoked.  Performed at [facility], [address] [place], [place] [address]     Coagulation Studies: No results for input(s): LABPROT, INR in the last 72 hours.  Urinalysis: No results for input(s): COLORURINE, LABSPEC, PHURINE, GLUCOSEU, HGBUR, BILIRUBINUR, KETONESUR, PROTEINUR, UROBILINOGEN, NITRITE, LEUKOCYTESUR in the last 72 hours.  Invalid input(s): APPERANCEUR    Imaging: No results found.   Medications:   . sodium chloride    . dialysis solution 2.5% low-MG/low-CA     . amLODipine  10 mg Oral QPM  . aspirin EC  81 mg Oral Daily  . carvedilol  25 mg Oral BID WC  . cloNIDine  0.3 mg Oral TID  . clopidogrel  75 mg Oral Daily  . doxazosin  4 mg Oral BID  . ezetimibe  10 mg Oral Daily  . gentamicin cream  1 application Topical Daily  . heparin  5,000 Units Subcutaneous Q8H  . insulin aspart  0-5 Units Subcutaneous QHS  . insulin aspart  0-9 Units Subcutaneous TID WC  . ipratropium-albuterol  3 mL Nebulization Q6H  . irbesartan  300 mg Oral Daily  . rosuvastatin  20 mg Oral QHS  . sodium chloride flush  3 mL Intravenous Q12H  . torsemide  40 mg Oral Daily   sodium chloride, acetaminophen, diphenhydrAMINE, heparin,  ondansetron (ZOFRAN) IV, sodium chloride flush  Assessment/ Plan:  75 y.o. male with coronary artery disease, systolic CHF with an EF of 45% from December 2021, end-stage renal disease, hypertension, cardiovascular disease, sleep apnea, history of renal cell carcinoma status post left partial nephrectomy, history of stroke was admitted on 08/06/2020 for  Principal Problem:   Fluid overload Active Problems:   Tobacco abuse counseling   Prostate cancer ([place])   Coronary artery disease involving native coronary artery of native heart without angina pectoris   Hypertensive urgency   Acute on chronic systolic congestive heart failure (HCC)   Anemia of chronic disease   ESRD on peritoneal dialysis ([place])  ESRD (end stage renal disease) ([place]) [N18.6] Fluid overload [E87.70] Other hypervolemia [E87.79]  Home PD regimen of fill volume 2000 cc x 3 cycles  #. ESRD on PD  SOB and Bilateral lower extremity edema improved Continue Torsemide Patient can be followed up as outpatient for PD management   #Hypertension, severe, shortness of breath   BP Readings from Last 2 Encounters:  08/09/20 (!) 158/60  07/31/20 (!) 180/70   IV lasix discontinued. Now on torsemide PO Patient is on Amlodipine,carvedilol,Clonidine and Irbesartan   #. Anemia of CKD  Lab Results  Component Value Date   HGB 10.9 (L) 08/09/2020   Within acceptable range, will continue monitoring CBCs  #. Secondary hyperparathyroidism of renal origin N 25.81      Component Value Date/Time   PTH 315 (H) 05/11/2017 1450   Lab Results  Component Value Date   PHOS 5.5 (H) 08/09/2020  Will continue monitoring bone mineral metabolism parameters as outpatient     LOS: 3 [place] 3/19/20224:48 [facility] [place], [place]

## 2020-08-09 NOTE — Discharge Summary (Signed)
Physician Discharge Summary  [name] [id number] DOB: 1945/12/16 DOA: 08/06/2020  PCP: Crecencio Mc, MD  Admit date: 08/06/2020 Discharge date: 08/09/2020  Admitted From: home  Disposition:  home   Recommendations for Outpatient Follow-up:  1. F/u BP  Home Health:  none  Discharge Condition:  stable   CODE STATUS:  Full code   Diet recommendation: renal and heart healthy Consultations:  Nephrology  cardiology  Procedures/Studies: . PD   Discharge Diagnoses:  Principal Problem:   Fluid overload Active Problems:   Hypertensive urgency   Tobacco abuse counseling   Prostate cancer ([place])   Coronary artery disease involving native coronary artery of native heart without angina pectoris   Acute on chronic systolic congestive heart failure ([place])   Anemia of chronic disease   ESRD on peritoneal dialysis ([place])     Brief Summary: [name] is a 75 y.o.malewith medical history significant forCAD, systolic heart failure, last EF 40 to 45% December 2021, ESRD on PD,hypertension, peripheral arterial disease, sleep apnea, renal cell carcinoma status post left heminephrectomy,history of CVAwho presents to the emergency room with a 2-week history of progressively worsening shortness of breath, now associated with orthopnea, lower extremity edema and weight gain of several pounds in the last few days. He has also has very uncontrolled BP despite many medication changes as outpatient.    Hospital Course:  Principal Problem:   ESRD on peritoneal dialysis /  Acute on chronic systolic congestive heart failure ([place]) - has improved somewhat with Lasix but not enough - dialysis catheter became clotted overnight last night- have notified renal - renal has increases the exchanges and decrease the volume- - weight has improved from 152 lb to 140 lb today  Active Problems:  h/o CAD - no c/o chest pain - cont, statin Plavix and ASA  Nicotine abuse - counseled to  quit    Hypertensive urgency - resumed home meds- removed excess fluid- PRN Labetalol given - Ibesartan and Demedex have been added during this admission    Anemia of chronic disease - Hb 10-11 range- follow  DM2 - A1c is 6.3- apparently is diet controlled as outpt    Discharge Exam: Vitals:   08/09/20 0814 08/09/20 1139  BP: (!) 182/71 (!) 158/60  Pulse: 61 (!) 52  Resp: 16 16  Temp: (!) 97.5 F (36.4 C) 98.1 F (36.7 C)  SpO2: 95% 98%   Vitals:   08/09/20 0500 08/09/20 0614 08/09/20 0814 08/09/20 1139  BP:  (!) 186/73 (!) 182/71 (!) 158/60  Pulse:  (!) 55 61 (!) 52  Resp:  17 16 16   Temp:  97.8 F (36.6 C) (!) 97.5 F (36.4 C) 98.1 F (36.7 C)  TempSrc:  Oral Oral Oral  SpO2:  96% 95% 98%  Weight: 63.6 kg     Height:        General: Pt is alert, awake, not in acute distress Cardiovascular: RRR, S1/S2 +, no rubs, no gallops Respiratory: CTA bilaterally, no wheezing, no rhonchi Abdominal: Soft, NT, ND, bowel sounds + Extremities: no edema, no cyanosis   Discharge Instructions  Discharge Instructions    (HEART FAILURE PATIENTS) Call MD:  Anytime you have any of the following symptoms: 1) 3 pound weight gain in 24 hours or 5 pounds in 1 week 2) shortness of breath, with or without a dry hacking cough 3) swelling in the hands, feet or stomach 4) if you have to sleep on extra pillows at night in order to  breathe.   Complete by: As directed    Diet - low sodium heart healthy   Complete by: As directed    Renal diet   Increase activity slowly   Complete by: As directed    No wound care   Complete by: As directed      Allergies as of 08/09/2020      Reactions   Irbesartan Other (See Comments)   hyperkalemia   Cymbalta [duloxetine Hcl]    Hydralazine Other (See Comments)   headache   Imdur [isosorbide Nitrate] Other (See Comments)   headache   Atorvastatin Other (See Comments)   Muscle pain   Bystolic [nebivolol Hcl]    Extreme fatigue        Medication List    STOP taking these medications   sodium bicarbonate 325 MG tablet     TAKE these medications   albuterol 108 (90 Base) MCG/ACT inhaler Commonly known as: VENTOLIN HFA Inhale 2 puffs into the lungs every 6 (six) hours as needed for wheezing or shortness of breath.   amLODipine 10 MG tablet Commonly known as: NORVASC TAKE 1 TABLET BY MOUTH ONCE DAILY   aspirin 81 MG tablet Take 1 tablet (81 mg total) by mouth daily.   calcitRIOL 0.25 MCG capsule Commonly known as: ROCALTROL Take 0.25 mcg by mouth every Monday, Wednesday, and Friday.   carvedilol 25 MG tablet Commonly known as: COREG Take 25 mg by mouth 2 (two) times daily with a meal.   cloNIDine 0.2 MG tablet Commonly known as: CATAPRES Take 0.2 mg by mouth 3 (three) times daily.   clopidogrel 75 MG tablet Commonly known as: PLAVIX Take 1 tablet (75 mg total) by mouth daily.   diphenhydrAMINE 25 MG tablet Commonly known as: BENADRYL Take 25 mg by mouth every 6 (six) hours as needed for allergies.   doxazosin 8 MG tablet Commonly known as: CARDURA Take 8 mg by mouth daily.   ezetimibe 10 MG tablet Commonly known as: ZETIA Take 1 tablet (10 mg total) by mouth daily.   fluticasone 50 MCG/ACT nasal spray Commonly known as: FLONASE Place 2 sprays into both nostrils daily.   irbesartan 300 MG tablet Commonly known as: AVAPRO Take 1 tablet (300 mg total) by mouth daily. Start taking on: August 10, 2020   multivitamin Tabs tablet Take 1 tablet by mouth at bedtime.   nicotine 14 mg/24hr patch Commonly known as: NICODERM CQ - dosed in mg/24 hours Uses occassionally   rosuvastatin 20 MG tablet Commonly known as: Crestor Take 1 tablet (20 mg total) by mouth at bedtime.   Torsemide 40 MG Tabs Take 40 mg by mouth daily. Start taking on: August 10, 2020       Allergies  Allergen Reactions  . Irbesartan Other (See Comments)    hyperkalemia  . Cymbalta [Duloxetine Hcl]   . Hydralazine  Other (See Comments)    headache  . Imdur [Isosorbide Nitrate] Other (See Comments)    headache  . Atorvastatin Other (See Comments)    Muscle pain  . Bystolic [Nebivolol Hcl]     Extreme fatigue       DG Chest 2 View  Result Date: 08/07/2020 CLINICAL DATA:  Shortness of breath for 1 week EXAM: CHEST - 2 VIEW COMPARISON:  08/06/2020 FINDINGS: Stable mild cardiomegaly. Atherosclerotic calcification of the aortic knob. Chronic coarsened interstitial markings. No new focal airspace consolidation. No pleural effusion or pneumothorax. IMPRESSION: No active cardiopulmonary disease. Electronically Signed   By: Hart Carwin  Plundo D.O.   On: 08/07/2020 12:00   DG Chest 2 View  Result Date: 08/06/2020 CLINICAL DATA:  Shortness of breath. EXAM: CHEST - 2 VIEW COMPARISON:  May 11, 2020 FINDINGS: The lungs are hyperinflated. Chronic appearing increased lung markings are seen. Mild blunting of the left costophrenic angle is noted. There is no evidence of a pneumothorax. The cardiac silhouette is mildly enlarged. Moderate severity calcification of the aortic arch is seen. The visualized skeletal structures are unremarkable. IMPRESSION: Stable cardiomegaly with a very small left pleural effusion versus pleural thickening. Electronically Signed   By: Virgina Norfolk M.D.   On: 08/06/2020 18:35   DG Abd 1 View  Result Date: 08/07/2020 CLINICAL DATA:  Evaluate peritoneal dialysis catheter EXAM: ABDOMEN - 1 VIEW COMPARISON:  12/11/2012 FINDINGS: Peritoneal dialysis catheter is coiled within the pelvis, to the right of midline. The bowel gas pattern is normal. No radio-opaque calculi or other significant radiographic abnormality are seen. Surgical clips in the left hemiabdomen. IMPRESSION: 1. Nonobstructive bowel gas pattern. 2. Peritoneal dialysis catheter is coiled within the pelvis, to the right of midline. Electronically Signed   By: Davina Poke D.O.   On: 08/07/2020 12:13     The results of  significant diagnostics from this hospitalization (including imaging, microbiology, ancillary and laboratory) are listed below for reference.     Microbiology: Recent Results (from the past 240 hour(s))  Resp Panel by RT-PCR (Flu A&B, Covid) Nasopharyngeal Swab     Status: None   Collection Time: 08/06/20  7:29 PM   Specimen: Nasopharyngeal Swab; Nasopharyngeal(NP) swabs in vial transport medium  Result Value Ref Range Status   SARS Coronavirus 2 by RT PCR NEGATIVE NEGATIVE Final    Comment: (NOTE) SARS-CoV-2 target nucleic acids are NOT DETECTED.  The SARS-CoV-2 RNA is generally detectable in upper respiratory specimens during the acute phase of infection. The lowest concentration of SARS-CoV-2 viral copies this assay can detect is 138 copies/mL. A negative result does not preclude SARS-Cov-2 infection and should not be used as the sole basis for treatment or other patient management decisions. A negative result may occur with  improper specimen collection/handling, submission of specimen other than nasopharyngeal swab, presence of viral mutation(s) within the areas targeted by this assay, and inadequate number of viral copies(<138 copies/mL). A negative result must be combined with clinical observations, patient history, and epidemiological information. The expected result is Negative.  Fact Sheet for Patients:  EntrepreneurPulse.com.au  Fact Sheet for Healthcare Providers:  IncredibleEmployment.be  This test is no t yet approved or cleared by the [place] FDA and  has been authorized for detection and/or diagnosis of SARS-CoV-2 by FDA under an Emergency Use Authorization (EUA). This EUA will remain  in effect (meaning this test can be used) for the duration of the COVID-19 declaration under Section 564(b)(1) of the Act, 21 U.S.C.section 360bbb-3(b)(1), unless the authorization is terminated  or revoked sooner.       Influenza A by  PCR NEGATIVE NEGATIVE Final   Influenza B by PCR NEGATIVE NEGATIVE Final    Comment: (NOTE) The Xpert Xpress SARS-CoV-2/FLU/RSV plus assay is intended as an aid in the diagnosis of influenza from Nasopharyngeal swab specimens and should not be used as a sole basis for treatment. Nasal washings and aspirates are unacceptable for Xpert Xpress SARS-CoV-2/FLU/RSV testing.  Fact Sheet for Patients: EntrepreneurPulse.com.au  Fact Sheet for Healthcare Providers: IncredibleEmployment.be  This test is not yet approved or cleared by the [place] FDA and has been  authorized for detection and/or diagnosis of SARS-CoV-2 by FDA under an Emergency Use Authorization (EUA). This EUA will remain in effect (meaning this test can be used) for the duration of the COVID-19 declaration under Section 564(b)(1) of the Act, 21 U.S.C. section 360bbb-3(b)(1), unless the authorization is terminated or revoked.  Performed at [facility], [address] [place], [place] [address]      Labs: BNP (last 3 results) Recent Labs    05/11/20 1042 08/06/20 1804  BNP 3,057.7* 4,627.0*   Basic Metabolic Panel: Recent Labs  Lab 08/06/20 1804 08/06/20 1929 08/07/20 0423 08/08/20 0414 08/09/20 0441  NA 140  --  140 140 138  K 4.5  --  3.7 3.7 3.5  CL 106  --  108 107 106  CO2 25  --  23 24 25   GLUCOSE 132*  --  92 159* 153*  BUN 41*  --  43* 42* 38*  CREATININE 6.51*  --  6.70* 6.62* 6.44*  CALCIUM 8.3*  --  8.2* 8.3* 8.0*  MG  --  2.2  --   --   --   PHOS  --   --   --   --  5.5*   Liver Function Tests: No results for input(s): AST, ALT, ALKPHOS, BILITOT, PROT, ALBUMIN in the last 168 hours. No results for input(s): LIPASE, AMYLASE in the last 168 hours. No results for input(s): AMMONIA in the last 168 hours. CBC: Recent Labs  Lab 08/06/20 1804 08/09/20 0441  WBC 10.3 7.8  HGB 10.7* 10.9*  HCT 31.6* 31.6*  MCV 93.5 91.3  PLT 188 163    Cardiac Enzymes: No results for input(s): CKTOTAL, CKMB, CKMBINDEX, TROPONINI in the last 168 hours. BNP: Invalid input(s): POCBNP CBG: Recent Labs  Lab 08/08/20 1204 08/08/20 1642 08/08/20 2113 08/09/20 0813 08/09/20 1139  GLUCAP 146* 165* 132* 139* 158*   D-Dimer No results for input(s): DDIMER in the last 72 hours. Hgb A1c Recent Labs    08/06/20 2126  HGBA1C 6.3*   Lipid Profile No results for input(s): CHOL, HDL, LDLCALC, TRIG, CHOLHDL, LDLDIRECT in the last 72 hours. Thyroid function studies No results for input(s): TSH, T4TOTAL, T3FREE, THYROIDAB in the last 72 hours.  Invalid input(s): FREET3 Anemia work up No results for input(s): VITAMINB12, FOLATE, FERRITIN, TIBC, IRON, RETICCTPCT in the last 72 hours. Urinalysis    Component Value Date/Time   COLORURINE YELLOW 01/21/2020 [place] 01/21/2020 1223   APPEARANCEUR Clear 01/03/2012 1117   LABSPEC 1.010 01/21/2020 1223   LABSPEC 1.004 01/03/2012 1117   PHURINE 7.0 01/21/2020 1223   GLUCOSEU 100 (A) 01/21/2020 1223   HGBUR TRACE-INTACT (A) 01/21/2020 1223   BILIRUBINUR NEGATIVE 01/21/2020 1223   BILIRUBINUR neg 07/30/2013 1127   BILIRUBINUR Negative 01/03/2012 [place] 01/21/2020 1223   PROTEINUR 100 07/30/2013 1127   PROTEINUR Negative 01/03/2012 1117   PROTEINUR 30 (A) 06/30/2011 0822   UROBILINOGEN 0.2 01/21/2020 1223   NITRITE NEGATIVE 01/21/2020 1223   LEUKOCYTESUR NEGATIVE 01/21/2020 1223   LEUKOCYTESUR Negative 01/03/2012 1117   Sepsis Labs Invalid input(s): PROCALCITONIN,  WBC,  LACTICIDVEN Microbiology Recent Results (from the past 240 hour(s))  Resp Panel by RT-PCR (Flu A&B, Covid) Nasopharyngeal Swab     Status: None   Collection Time: 08/06/20  7:29 PM   Specimen: Nasopharyngeal Swab; Nasopharyngeal(NP) swabs in vial transport medium  Result Value Ref Range Status   SARS Coronavirus 2 by RT PCR NEGATIVE NEGATIVE Final  Comment: (NOTE) SARS-CoV-2  target nucleic acids are NOT DETECTED.  The SARS-CoV-2 RNA is generally detectable in upper respiratory specimens during the acute phase of infection. The lowest concentration of SARS-CoV-2 viral copies this assay can detect is 138 copies/mL. A negative result does not preclude SARS-Cov-2 infection and should not be used as the sole basis for treatment or other patient management decisions. A negative result may occur with  improper specimen collection/handling, submission of specimen other than nasopharyngeal swab, presence of viral mutation(s) within the areas targeted by this assay, and inadequate number of viral copies(<138 copies/mL). A negative result must be combined with clinical observations, patient history, and epidemiological information. The expected result is Negative.  Fact Sheet for Patients:  EntrepreneurPulse.com.au  Fact Sheet for Healthcare Providers:  IncredibleEmployment.be  This test is no t yet approved or cleared by the [place] FDA and  has been authorized for detection and/or diagnosis of SARS-CoV-2 by FDA under an Emergency Use Authorization (EUA). This EUA will remain  in effect (meaning this test can be used) for the duration of the COVID-19 declaration under Section 564(b)(1) of the Act, 21 U.S.C.section 360bbb-3(b)(1), unless the authorization is terminated  or revoked sooner.       Influenza A by PCR NEGATIVE NEGATIVE Final   Influenza B by PCR NEGATIVE NEGATIVE Final    Comment: (NOTE) The Xpert Xpress SARS-CoV-2/FLU/RSV plus assay is intended as an aid in the diagnosis of influenza from Nasopharyngeal swab specimens and should not be used as a sole basis for treatment. Nasal washings and aspirates are unacceptable for Xpert Xpress SARS-CoV-2/FLU/RSV testing.  Fact Sheet for Patients: EntrepreneurPulse.com.au  Fact Sheet for Healthcare  Providers: IncredibleEmployment.be  This test is not yet approved or cleared by the [place] FDA and has been authorized for detection and/or diagnosis of SARS-CoV-2 by FDA under an Emergency Use Authorization (EUA). This EUA will remain in effect (meaning this test can be used) for the duration of the COVID-19 declaration under Section 564(b)(1) of the Act, 21 U.S.C. section 360bbb-3(b)(1), unless the authorization is terminated or revoked.  Performed at [facility], [address] [place], [place] [address]      Time coordinating discharge in minutes: 65  SIGNED:   Debbe Odea, MD  [name] 08/09/2020, 2:37 PM

## 2020-08-09 NOTE — Progress Notes (Signed)
Progress Note  Patient Name: [name] Date of Encounter: 08/09/2020  [facility] Cardiologist: [name], MD   Subjective   Shortness of breath much improved, BP still elevated.  He states his systolics at home is usually high in the range of 200s.  Inpatient Medications    Scheduled Meds: . amLODipine  10 mg Oral QPM  . aspirin EC  81 mg Oral Daily  . carvedilol  25 mg Oral BID WC  . cloNIDine  0.3 mg Oral TID  . clopidogrel  75 mg Oral Daily  . doxazosin  4 mg Oral BID  . ezetimibe  10 mg Oral Daily  . gentamicin cream  1 application Topical Daily  . heparin  5,000 Units Subcutaneous Q8H  . insulin aspart  0-5 Units Subcutaneous QHS  . insulin aspart  0-9 Units Subcutaneous TID WC  . irbesartan  300 mg Oral Daily  . rosuvastatin  20 mg Oral QHS  . sodium chloride flush  3 mL Intravenous Q12H  . torsemide  40 mg Oral Daily   Continuous Infusions: . sodium chloride    . dialysis solution 2.5% low-MG/low-CA     PRN Meds: sodium chloride, acetaminophen, diphenhydrAMINE, heparin, ondansetron (ZOFRAN) IV, sodium chloride flush   Vital Signs    Vitals:   08/09/20 0500 08/09/20 0614 08/09/20 0814 08/09/20 1139  BP:  (!) 186/73 (!) 182/71 (!) 158/60  Pulse:  (!) 55 61 (!) 52  Resp:  17 16 16   Temp:  97.8 F (36.6 C) (!) 97.5 F (36.4 C) 98.1 F (36.7 C)  TempSrc:  Oral Oral Oral  SpO2:  96% 95% 98%  Weight: 63.6 kg     Height:        Intake/Output Summary (Last 24 hours) at 08/09/2020 1257 Last data filed at 08/09/2020 1103 Gross per 24 hour  Intake 480 ml  Output 750 ml  Net -270 ml   Last 3 Weights 08/09/2020 08/08/2020 08/07/2020  Weight (lbs) 140 lb 3.2 oz 146 lb 13.2 oz 152 lb 3.2 oz  Weight (kg) 63.594 kg 66.6 kg 69.037 kg      Telemetry    Sinus rhythm/sinus bradycardia heart rate 53- Personally Reviewed  ECG     - Personally Reviewed  Physical Exam   GEN: No acute distress.   Neck: No JVD Cardiac: RRR,  Respiratory: Clear  to auscultation bilaterally. GI: Soft, nontender, non-distended, PD port in abdomen noted MS: No edema; No deformity. Neuro:  Nonfocal  Psych: Normal affect   Labs    High Sensitivity Troponin:   Recent Labs  Lab 08/06/20 1804 08/06/20 2126  TROPONINIHS 14 16      Chemistry Recent Labs  Lab 08/07/20 0423 08/08/20 0414 08/09/20 0441  NA 140 140 138  K 3.7 3.7 3.5  CL 108 107 106  CO2 23 24 25   GLUCOSE 92 159* 153*  BUN 43* 42* 38*  CREATININE 6.70* 6.62* 6.44*  CALCIUM 8.2* 8.3* 8.0*  GFRNONAA 8* 8* 8*  ANIONGAP 9 9 7      Hematology Recent Labs  Lab 08/06/20 1804 08/09/20 0441  WBC 10.3 7.8  RBC 3.38* 3.46*  HGB 10.7* 10.9*  HCT 31.6* 31.6*  MCV 93.5 91.3  MCH 31.7 31.5  MCHC 33.9 34.5  RDW 15.5 15.5  PLT 188 163    BNP Recent Labs  Lab 08/06/20 1804  BNP 1,785.3*     DDimer No results for input(s): DDIMER in the last 168 hours.  Radiology    No results found.  Cardiac Studies   Echo 04/2020 1. Left ventricular ejection fraction, by estimation, is 40 to 45%. The  left ventricle has mildly decreased function. The left ventricle has no  regional wall motion abnormalities. The left ventricular internal cavity  size was mildly dilated. There is  moderate left ventricular hypertrophy. Left ventricular diastolic  parameters are consistent with Grade I diastolic dysfunction (impaired  relaxation). The average left ventricular global longitudinal strain is  -13.7 %. The global longitudinal strain is  abnormal.  2. Right ventricular systolic function is normal. The right ventricular  size is normal. Tricuspid regurgitation signal is inadequate for assessing  PA pressure.  3. Left atrial size was moderately dilated.  4. The mitral valve is normal in structure. Mild mitral valve  regurgitation. No evidence of mitral stenosis.  5. The aortic valve is normal in structure. Aortic valve regurgitation is  mild. Mild to moderate aortic valve  sclerosis/calcification is present,  without any evidence of aortic stenosis.  6. Aortic dilatation noted. There is mild dilatation of the aortic root,  measuring 39 mm.  7. The inferior vena cava is dilated in size with >50% respiratory  variability, suggesting right atrial pressure of 8 mmHg.   Patient Profile     75 y.o. male with history of cardiomyopathy, EF 40 to 45%, decline ischemic work-up, end-stage renal disease on dialysis, CVA, diabetes, PAD presenting with shortness of breath, being seen for hypertension and CHF  Assessment & Plan    1.  Mild to moderately reduced EF, EF 40 to 45% -Decline ischemic work-up in the past -Continue Coreg, irbesartan -Dialysis for volume control.  Also on torsemide 40  2.  Hypertension -BP still elevated, increase clonidine to 0.3 mg 3 times daily -Irbesartan increased to 300 mg daily.  Continue Coreg, Norvasc  3.  History of CVA, PAD -Aspirin, Plavix, Crestor  No additional cardiac testing planned this admission.  Continue titration of antihypertensive for adequate BP control.  Difficult to control blood pressures likely secondary hypertension from end-stage renal disease.  Total encounter time 35 minutes  Greater than 50% was spent in counseling and coordination of care with the patient     Signed, Kate Sable, MD  08/09/2020, 12:57 PM

## 2020-08-11 ENCOUNTER — Telehealth: Payer: Self-pay | Admitting: *Deleted

## 2020-08-11 ENCOUNTER — Other Ambulatory Visit: Payer: Self-pay

## 2020-08-11 DIAGNOSIS — I5023 Acute on chronic systolic (congestive) heart failure: Secondary | ICD-10-CM

## 2020-08-11 NOTE — Chronic Care Management (AMB) (Signed)
  Chronic Care Management   Note  08/11/2020 Name: [name] MRN: [id number] DOB: 05/13/1946  [name] is a 75 y.o. year old male who is a primary care patient of Derrel Nip, Aris Everts, MD. I reached out to [name] by phone today in response to a referral sent by [name]'s PCP, Crecencio Mc, MD     [name] was given information about Chronic Care Management services today including:  1. CCM service includes personalized support from designated clinical staff supervised by his physician, including individualized plan of care and coordination with other care providers 2. 24/7 contact phone numbers for assistance for urgent and routine care needs. 3. Service will only be billed when office clinical staff spend 20 minutes or more in a month to coordinate care. 4. Only one practitioner may furnish and bill the service in a calendar month. 5. The patient may stop CCM services at any time (effective at the end of the month) by phone call to the office staff. 6. The patient will be responsible for cost sharing (co-pay) of up to 20% of the service fee (after annual deductible is met).  Patient agreed to services and verbal consent obtained.   Follow up plan: Telephone appointment with care management team member scheduled for:08/15/2020    [facility]

## 2020-08-11 NOTE — Chronic Care Management (AMB) (Deleted)
  Chronic Care Management   Note  08/11/2020 Name: [name] MRN: [id number] DOB: [birth year]  [name] is a 75 y.o. year old male who is a primary care patient of Derrel Nip, Aris Everts, MD. I reached out to [name] by phone today in response to a referral sent by Mr. [name]'s {CCM REFERRED BY CHOICES:22240}     Mr. [name] was given information about Chronic Care Management services today including:  1. CCM service includes personalized support from designated clinical staff supervised by his physician, including individualized plan of care and coordination with other care providers 2. 24/7 contact phone numbers for assistance for urgent and routine care needs. 3. Service will only be billed when office clinical staff spend 20 minutes or more in a month to coordinate care. 4. Only one practitioner may furnish and bill the service in a calendar month. 5. The patient may stop CCM services at any time (effective at the end of the month) by phone call to the office staff. 6. The patient will be responsible for cost sharing (co-pay) of up to 20% of the service fee (after annual deductible is met).  {CCM CONSENT STATUS:22234}  Follow up plan: {CCM CG FOLLOW UP BRAX:09407}  SIGNATURE

## 2020-08-12 ENCOUNTER — Telehealth: Payer: Self-pay

## 2020-08-12 NOTE — Telephone Encounter (Signed)
Transition Care Management Follow-up Telephone Call  Date of discharge and from where: 08/09/20 from [facility]  How have you been since you were released from the hospital? Patient states, "I am getting better. I mowed the lawn today." Denies shortness of breath, dizziness, fluctuating weight, fluctuating BP. Taking all medication as directed.   Any questions or concerns? No  Items Reviewed:  Did the pt receive and understand the discharge instructions provided? Yes   Medications obtained and verified? Yes   Other? No  Any new allergies since your discharge? No   Dietary orders reviewed? Renal and heart healthy  Do you have support at home? Yes   Home Care and Equipment/Supplies: Were home health services ordered? No  Functional Questionnaire: (I = Independent and D = Dependent) ADLs: I  Bathing/Dressing- I  Meal Prep- I  Eating- I  Maintaining continence- I  Transferring/Ambulation- I  Managing Meds- I  Follow up appointments reviewed:   PCP Hospital f/u appt confirmed? Yes  Scheduled to see Dr. Derrel Nip on 08/20/20 @ 10:30.  Are transportation arrangements needed? No   If their condition worsens, is the pt aware to call PCP or go to the Emergency Dept.? Yes  Was the patient provided with contact information for the PCP's office or ED? Yes  Was to pt encouraged to call back with questions or concerns? Yes

## 2020-08-15 ENCOUNTER — Ambulatory Visit (INDEPENDENT_AMBULATORY_CARE_PROVIDER_SITE_OTHER): Payer: Medicare Other | Admitting: *Deleted

## 2020-08-15 DIAGNOSIS — N184 Chronic kidney disease, stage 4 (severe): Secondary | ICD-10-CM

## 2020-08-15 DIAGNOSIS — I10 Essential (primary) hypertension: Secondary | ICD-10-CM

## 2020-08-15 DIAGNOSIS — I429 Cardiomyopathy, unspecified: Secondary | ICD-10-CM

## 2020-08-15 DIAGNOSIS — I5023 Acute on chronic systolic (congestive) heart failure: Secondary | ICD-10-CM | POA: Diagnosis not present

## 2020-08-15 NOTE — Patient Instructions (Signed)
Visit Information  Nice meeting and speaking with you today.  Please call with any questions or concerns.  Remember to speak with Nephrologist about changing dialysis solution. Sheppard Evens [phone]  PATIENT GOALS:  Goals Addressed            This Visit's Progress   . (RNCM) Track and Manage Fluids and Swelling-Heart Failure       Timeframe:  Long-Range Goal Priority:  High Start Date:   08/15/20                          Expected End Date:  02/20/21                     Follow Up Date 08/29/20    . Call office if I gain more than 2 pounds in one day or 5 pounds in one week . Keep legs up while sitting and watch for swelling in feet, ankles and legs every day . Weigh myself daily and write in log, taking log to medical appointments for provider review . Discuss productive cough with primary care provider . Continue with home peritoneal dialysis and discuss with nephrologist abdominal bloating during treatments and possibility to change solutions   Why is this important?    It is important to check your weight daily and watch how much salt and liquids you have.   It will help you to manage your heart failure.    Notes:    Marland Kitchen [facility]) Track and Manage My Blood Pressure-Hypertension       Timeframe:  Long-Range Goal Priority:  Medium Start Date: 08/15/20                            Expected End Date:  02/20/21                     Follow Up Date 08/29/20    . Check blood pressure daily . Write blood pressure results in a log and take log to appointments for provider review . Low salt heart healthy renal diet . If blood pressure elevated prior to taking medication, recheck blood pressure about 2 hours after taking medicine and write in log . Take medications as prescribed . Speak with provider about goal blood pressure  Why is this important?    You won't feel high blood pressure, but it can still hurt your blood vessels.   High blood pressure can cause heart or kidney problems. It can  also cause a stroke.   Making lifestyle changes like losing a little weight or eating less salt will help.   Checking your blood pressure at home and at different times of the day can help to control blood pressure.   If the doctor prescribes medicine remember to take it the way the doctor ordered.   Call the office if you cannot afford the medicine or if there are questions about it.     Notes:     Consent to CCM Services: Mr. Hillman was given information about Chronic Care Management services today including:  1. CCM service includes personalized support from designated clinical staff supervised by his physician, including individualized plan of care and coordination with other care providers 2. 24/7 contact phone numbers for assistance for urgent and routine care needs. 3. Service will only be billed when office clinical staff spend 20 minutes or more in a month to coordinate  care. 4. Only one practitioner may furnish and bill the service in a calendar month. 5. The patient may stop CCM services at any time (effective at the end of the month) by phone call to the office staff. 6. The patient will be responsible for cost sharing (co-pay) of up to 20% of the service fee (after annual deductible is met).  Patient agreed to services and verbal consent obtained.   Patient verbalizes understanding of instructions provided today and agrees to view in [place].   The care management team will reach out to the patient again over the next 20 business days.   Hubert Azure RN, MSN RN Care Management Coordinator [place] [phone] [email or web address]   CLINICAL CARE PLAN: Patient Care Plan: Heart Failure (Adult)  Problem Identified: Symptom Exacerbation (Heart Failure)   Priority: High  Long-Range Goal: Patient will report no rehospitalization within the next 90 days   Start Date: 08/15/2020  Expected End Date: 02/20/2021  Priority: High   Current Barriers:  Marland Kitchen Knowledge deficit related to basic heart failure pathophysiology and self care management as evidenced by recent hospitalization for heart failure exacerbation.  Patient reporting weighing daily.  Weight this morning 140.3 pounds and has been steady since discharge.  Denies any lower extremity edema currently and denies any shortness of breath.  Does report one episode of shortness of breath since discharge requiring use of rescue inhaler once with resolution.  Patient does report a productive cough of fairly clear sputum over the last few months.  States he would like to speak with provider about this concern.  Does continue to smoke. . Chronic Kidney Disease/Failure requiring peritoneal dialysis -patient reporting since discharge he continues with nightly peritoneal dialysis on the machine.  Does state he feels the solution being used bloats him up and adds weight to his abdomen.  States he is speaking the with dialysis center and nephrologist about possible changing the solution Case Manager Clinical Goal(s):  . patient will weigh self daily and record . patient will verbalize understanding of Heart Failure Action Plan and when to call doctor . patient will take all Heart Failure mediations as prescribed . patient will weigh daily and record (notifying MD of 3 lb weight gain over night or 5 lb in a week) Interventions:  . Collaboration with Crecencio Mc, MD regarding development and update of comprehensive plan of care as evidenced by provider attestation and co-signature . Inter-disciplinary care team collaboration (see longitudinal plan of care) . Basic overview and discussion of pathophysiology of Heart Failure reviewed  . Provided verbal education on low sodium diet . Reviewed Heart Failure Action Plan in depth and provided written copy . Advised patient to weigh each morning after emptying bladder . Discussed importance of daily weight and advised patient to weigh and  record daily and to notify provider for 3 lb weight gain over night or 5 lb in a week . Reviewed role of diuretics in prevention of fluid overload and management of heart failure  Assessed understanding of adherence and barriers to treatment plan, as well as lifestyle changes; develop strategies to address barriers.   Established a mutually-agreed-upon early intervention process to communicate with primary care provider/cardiologist/nephrologist when signs/symptoms worsen.  Discussed productive cough and encouraged patient to discuss with provider  Verified and discussed continued peritoneal dialysis and encouraged patient to have conversation with Nephrologist concerning questions related to dialysis solution  Discussed and encouraged daily monitoring of lower extremity edema and notify provider if symptoms arise  Reviewed medications and encouraged medication compliance  Self-awareness of signs/symptoms of worsening disease encouraged Patient Goals/Self-Care Activities:: . Call office if I gain more than 2 pounds in one day or 5 pounds in one week . Keep legs up while sitting and watch for swelling in feet, ankles and legs every day . Weigh myself daily and write in log, taking log to medical appointments for provider review . Discuss productive cough with primary care provider . Continue with home peritoneal dialysis and discuss with nephrologist abdominal bloating during treatments and possibility to change solutions Follow Up Plan: The care management team will reach out to the patient again over the next 20 business days.    Patient Care Plan: Hypertension (Adult)  Problem Identified: Resistant Hypertension (Hypertension)   Priority: Medium  Long-Range Goal: Patient will report no hypertensive episodes of SBP >200   Start Date: 08/15/2020  Expected End Date: 02/20/2021  Priority: Medium  Objective:  . Last practice recorded BP readings:  BP Readings from Last 3 Encounters:   08/09/20 (!) 158/60  07/31/20 (!) 180/70  07/16/20 (!) 176/66  Current Barriers:  Marland Kitchen Knowledge Deficits related to basic understanding of hypertension pathophysiology and self care management Difficulties managing hypertension as evidenced by patient reporting systolic blood pressures of 200's on a daily basis.  Patient does report blood pressure ranges have been better since hospitalization and initiation of new medications. Blood pressure this morning was 184/85 prior to medication and then 160/65 after medication.  States SBP has not been up to 200 since discharge. Case Manager Clinical Goal(s):  . patient will verbalize understanding of plan for hypertension management . patient will demonstrate improved adherence to prescribed treatment plan for hypertension as evidenced by taking all medications as prescribed, monitoring and recording blood pressure as directed, adhering to low sodium/DASH diet Interventions:  . Collaboration with Crecencio Mc, MD regarding development and update of comprehensive plan of care as evidenced by provider attestation and co-signature . Inter-disciplinary care team collaboration (see longitudinal plan of care) . Evaluation of current treatment plan related to hypertension self management and patient's adherence to plan as established by provider. . Provided education to patient re: stroke prevention, s/s of heart attack and stroke, DASH diet, complications of uncontrolled blood pressure . Reviewed medications with patient and discussed importance of compliance . Discussed plans with patient for ongoing care management follow up and provided patient with direct contact information for care management team . Advised patient, providing education and rationale, to monitor blood pressure daily and record, calling PCP for findings outside established parameters.  . Reviewed scheduled/upcoming provider appointments including:  Post hospital follow up on 08/20/20 with  primary care provider  Screen for signs and symptoms of depression; if present, refer for or complete a comprehensive assessment.   Evaluate social and economic barriers that may affect adherence to treatment plan   Reviewed current blood pressures and encouraged patient to contact provider for elevations SBP>200 after taking medications  Discussed low salt diet and food options  Encouraged to increase activity as tolerated  Sending EMMI on hypertension Patient Goals/Self-Care Activities: . Check blood pressure daily . Write blood pressure results in a log and take log to appointments for provider review . Low salt heart healthy renal diet . If blood pressure elevated prior to taking medication, recheck blood pressure about 2 hours after taking medicine and write in log . Take medications as prescribed . Speak with provider about goal blood pressure Follow Up Plan: The care  management team will reach out to the patient again over the next 20 business days.       Heart Failure Action Plan A heart failure action plan helps you understand what to do when you have symptoms of heart failure. Your action plan is a color-coded plan that lists the symptoms to watch for and indicates what actions to take.  If you have symptoms in the red zone, you need medical care right away.  If you have symptoms in the yellow zone, you are having problems.  If you have symptoms in the green zone, you are doing well. Follow the plan that was created by you and your health care provider. Review your plan each time you visit your health care provider. Red zone These signs and symptoms mean you should get medical help right away:  You have trouble breathing when resting.  You have a dry cough that is getting worse.  You have swelling or pain in your legs or abdomen that is getting worse.  You suddenly gain more than 2-3 lb (0.9-1.4 kg) in 24 hours, or more than 5 lb (2.3 kg) in a week. This amount may  be more or less depending on your condition.  You have trouble staying awake or you feel confused.  You have chest pain.  You do not have an appetite.  You pass out.  You have worsening sadness or depression. If you have any of these symptoms, call your local emergency services (911 in the [place] right away. Do not drive yourself to the hospital.   Yellow zone These signs and symptoms mean your condition may be getting worse and you should make some changes:  You have trouble breathing when you are active, or you need to sleep with your head raised on extra pillows to help you breathe.  You have swelling in your legs or abdomen.  You gain 2-3 lb (0.9-1.4 kg) in 24 hours, or 5 lb (2.3 kg) in a week. This amount may be more or less depending on your condition.  You get tired easily.  You have trouble sleeping.  You have a dry cough. If you have any of these symptoms:  Contact your health care provider within the next day.  Your health care provider may adjust your medicines.   Green zone These signs mean you are doing well and can continue what you are doing:  You do not have shortness of breath.  You have very little swelling or no new swelling.  Your weight is stable (no gain or loss).  You have a normal activity level.  You do not have chest pain or any other new symptoms.   Follow these instructions at home:  Take over-the-counter and prescription medicines only as told by your health care provider.  Weigh yourself daily. Your target weight is __________ lb (__________ kg). ? Call your health care provider if you gain more than __________ lb (__________ kg) in 24 hours, or more than __________ lb (__________ kg) in a week. ? Health care provider name: _____________________________________________________ ? Health care provider phone number: _____________________________________________________  Eat a heart-healthy diet. Work with a diet and nutrition specialist  (dietitian) to create an eating plan that is best for you.  Keep all follow-up visits. This is important. Where to find more information  American Heart Association: www.heart.org Summary  A heart failure action plan helps you understand what to do when you have symptoms of heart failure.  Follow the action plan that was  created by you and your health care provider.  Get help right away if you have any symptoms in the red zone. This information is not intended to replace advice given to you by your health care provider. Make sure you discuss any questions you have with your health care provider. Document Revised: 12/24/2019 Document Reviewed: 12/24/2019 Elsevier Patient Education  2021 [employer].

## 2020-08-15 NOTE — Chronic Care Management (AMB) (Signed)
Chronic Care Management   CCM RN Visit Note  08/15/2020 Name: [name] MRN: [id number] DOB: July 24, 1945  Subjective: [name] is a 75 y.o. year old male who is a primary care patient of Tullo, Aris Everts, MD. The care management team was consulted for assistance with disease management and care coordination needs.    Engaged with patient by telephone for initial visit in response to provider referral for case management and/or care coordination services.   Consent to Services:  The patient was given the following information about Chronic Care Management services today, agreed to services, and gave verbal consent: 1. CCM service includes personalized support from designated clinical staff supervised by the primary care provider, including individualized plan of care and coordination with other care providers 2. 24/7 contact phone numbers for assistance for urgent and routine care needs. 3. Service will only be billed when office clinical staff spend 20 minutes or more in a month to coordinate care. 4. Only one practitioner may furnish and bill the service in a calendar month. 5.The patient may stop CCM services at any time (effective at the end of the month) by phone call to the office staff. 6. The patient will be responsible for cost sharing (co-pay) of up to 20% of the service fee (after annual deductible is met). Patient agreed to services and consent obtained.  Patient agreed to services and verbal consent obtained.   Assessment: Review of patient past medical history, allergies, medications, health status, including review of consultants reports, laboratory and other test data, was performed as part of comprehensive evaluation and provision of chronic care management services.   SDOH (Social Determinants of Health) assessments and interventions performed:  SDOH Interventions   Flowsheet Row Most Recent Value  SDOH Interventions   Food Insecurity Interventions Intervention Not  Indicated  Financial Strain Interventions Intervention Not Indicated  Housing Interventions Intervention Not Indicated  Intimate Partner Violence Interventions Intervention Not Indicated  Physical Activity Interventions Intervention Not Indicated  Stress Interventions Intervention Not Indicated  Transportation Interventions Intervention Not Indicated       CCM Care Plan  Allergies  Allergen Reactions  . Irbesartan Other (See Comments)    hyperkalemia  . Cymbalta [Duloxetine Hcl]   . Hydralazine Other (See Comments)    headache  . Imdur [Isosorbide Nitrate] Other (See Comments)    headache  . Atorvastatin Other (See Comments)    Muscle pain  . Bystolic [Nebivolol Hcl]     Extreme fatigue     Outpatient Encounter Medications as of 08/15/2020  Medication Sig Note  . albuterol (VENTOLIN HFA) 108 (90 Base) MCG/ACT inhaler Inhale 2 puffs into the lungs every 6 (six) hours as needed for wheezing or shortness of breath.   Marland Kitchen amLODipine (NORVASC) 10 MG tablet TAKE 1 TABLET BY MOUTH ONCE DAILY   . aspirin 81 MG tablet Take 1 tablet (81 mg total) by mouth daily.   . carvedilol (COREG) 25 MG tablet Take 25 mg by mouth 2 (two) times daily with a meal.   . cloNIDine (CATAPRES) 0.2 MG tablet Take 0.2 mg by mouth 3 (three) times daily.   . clopidogrel (PLAVIX) 75 MG tablet Take 1 tablet (75 mg total) by mouth daily.   . diphenhydrAMINE (BENADRYL) 25 MG tablet Take 25 mg by mouth every 6 (six) hours as needed for allergies.   Marland Kitchen doxazosin (CARDURA) 8 MG tablet Take 8 mg by mouth daily.   Marland Kitchen ezetimibe (ZETIA) 10 MG tablet Take 1 tablet (  10 mg total) by mouth daily.   . fluticasone (FLONASE) 50 MCG/ACT nasal spray Place 2 sprays into both nostrils daily.   . irbesartan (AVAPRO) 300 MG tablet Take 1 tablet (300 mg total) by mouth daily.   . multivitamin (RENA-VIT) TABS tablet Take 1 tablet by mouth at bedtime.   . rosuvastatin (CRESTOR) 20 MG tablet Take 1 tablet (20 mg total) by mouth at bedtime.    . torsemide 40 MG TABS Take 40 mg by mouth daily.   . calcitRIOL (ROCALTROL) 0.25 MCG capsule Take 0.25 mcg by mouth every Monday, Wednesday, and Friday. (Patient not taking: Reported on 08/15/2020) 08/15/2020: Patient reports not taking  . nicotine (NICODERM CQ - DOSED IN MG/24 HOURS) 14 mg/24hr patch Uses occassionally (Patient not taking: Reported on 08/15/2020) 08/15/2020: Reports not taking   Facility-Administered Encounter Medications as of 08/15/2020  Medication  . ipratropium-albuterol (DUONEB) 0.5-2.5 (3) MG/3ML nebulizer solution 3 mL    Patient Active Problem List   Diagnosis Date Noted  . ESRD on peritoneal dialysis ([place]) 08/06/2020  . HFrEF (heart failure with reduced ejection fraction) ([place])   . AF (paroxysmal atrial fibrillation) ([place])   . Anemia of chronic disease   . Atrial fibrillation with rapid ventricular response ([place]) 05/12/2020  . Hypertensive urgency 05/11/2020  . Chronic HFrEF (heart failure with reduced ejection fraction) ([place]) 05/11/2020  . Fluid overload 05/11/2020  . Acute on chronic systolic congestive heart failure ([place])   . End stage renal disease ([place]) 02/19/2020  . Aortic arch atherosclerosis ([place]) 01/22/2020  . Hospital discharge follow-up 01/22/2020  . Urinary retention 01/22/2020  . Neurologic deficit as late effect of ischemic cerebrovascular accident (CVA) 01/15/2020  . Dissection of left subclavian artery ([place])   . Cerebrovascular accident (CVA) ([place]) 01/14/2020  . Constipation 05/13/2019  . Cardiomyopathy ([place]) 04/26/2019  . Coronary artery disease involving native coronary artery of native heart without angina pectoris 04/26/2019  . Chronic pain not due to malignancy 04/12/2019  . Chronic bilateral low back pain with bilateral sciatica 11/27/2018  . Synovial cyst of lumbar spine 11/27/2018  . Uncontrolled hypertension 11/14/2018  . CKD (chronic kidney disease), stage V ([place]) 11/14/2018  . Major depressive disorder with current active episode  07/26/2017  . Anemia of chronic kidney failure, stage 4 (severe) ([place]) 07/26/2017  . CKD (chronic kidney disease), stage IV ([place]) 11/25/2016  . DOE (dyspnea on exertion) 11/25/2016  . Type 2 diabetes mellitus with hyperlipidemia ([place]) 12/02/2014  . S/P UVPP (uvulopalatopharyngoplasty) 08/22/2014  . Presbyacusis 08/22/2014  . Adenocarcinoma, renal cell ([place]) 02/26/2014  . Renal cell carcinoma ([place]) 02/26/2014  . Malignant neoplasm of kidney excluding renal pelvis ([place]) 02/26/2014  . Peripheral arterial occlusive disease ([place]) 02/26/2014  . Elevated alkaline phosphatase measurement 07/30/2013  . Prostate CA ([place]) 07/30/2013  . Prostate cancer ([place]) 07/30/2013  . Elevated serum alkaline phosphatase level 07/30/2013  . Malignant neoplasm of prostate ([place]) 07/30/2013  . Hypertensive renal sclerosis with hypertension 10/18/2012  . Renal sclerosis with hypertension 10/18/2012  . Complication of diabetes mellitus ([place]) 07/16/2012  . PAD (peripheral artery disease) ([place])   . Left hip pain 04/20/2012  . Erectile dysfunction 04/20/2012  . Chronic kidney disease, stage III (moderate) ([place]) 04/20/2012  . Hip pain 04/20/2012  . TIA (transient ischemic attack) 01/15/2012  . Temporary cerebral vascular dysfunction 01/15/2012  . Tobacco abuse counseling 05/06/2011  . Counseling on substance use and abuse 05/06/2011  . Hyperlipidemia   . History of renal cell carcinoma  Conditions to be addressed/monitored:CHF and HTN  Care Plan : Heart Failure (Adult)  Updates made by Leona Singleton, RN since 08/15/2020 12:00 AM  Problem: Symptom Exacerbation (Heart Failure)   Priority: High  Long-Range Goal: Patient will report no rehospitalization within the next 90 days   Start Date: 08/15/2020  Expected End Date: 02/20/2021  Priority: High  Current Barriers:  Marland Kitchen Knowledge deficit related to basic heart failure pathophysiology and self care management as evidenced by recent hospitalization for heart  failure exacerbation.  Patient reporting weighing daily.  Weight this morning 140.3 pounds and has been steady since discharge.  Denies any lower extremity edema currently and denies any shortness of breath.  Does report one episode of shortness of breath since discharge requiring use of rescue inhaler once with resolution.  Patient does report a productive cough of fairly clear sputum over the last few months.  States he would like to speak with provider about this concern.  Does continue to smoke. . Chronic Kidney Disease/Failure requiring peritoneal dialysis -patient reporting since discharge he continues with nightly peritoneal dialysis on the machine.  Does state he feels the solution being used bloats him up and adds weight to his abdomen.  States he is speaking the with dialysis center and nephrologist about possible changing the solution Case Manager Clinical Goal(s):  . patient will weigh self daily and record . patient will verbalize understanding of Heart Failure Action Plan and when to call doctor . patient will take all Heart Failure mediations as prescribed . patient will weigh daily and record (notifying MD of 3 lb weight gain over night or 5 lb in a week) Interventions:  . Collaboration with Crecencio Mc, MD regarding development and update of comprehensive plan of care as evidenced by provider attestation and co-signature . Inter-disciplinary care team collaboration (see longitudinal plan of care) . Basic overview and discussion of pathophysiology of Heart Failure reviewed  . Provided verbal education on low sodium diet . Reviewed Heart Failure Action Plan in depth and provided written copy . Advised patient to weigh each morning after emptying bladder . Discussed importance of daily weight and advised patient to weigh and record daily and to notify provider for 3 lb weight gain over night or 5 lb in a week . Reviewed role of diuretics in prevention of fluid overload and management  of heart failure  Assessed understanding of adherence and barriers to treatment plan, as well as lifestyle changes; develop strategies to address barriers.   Established a mutually-agreed-upon early intervention process to communicate with primary care provider/cardiologist/nephrologist when signs/symptoms worsen.  Discussed productive cough and encouraged patient to discuss with provider  Verified and discussed continued peritoneal dialysis and encouraged patient to have conversation with Nephrologist concerning questions related to dialysis solution  Discussed and encouraged daily monitoring of lower extremity edema and notify provider if symptoms arise  Reviewed medications and encouraged medication compliance  Self-awareness of signs/symptoms of worsening disease encouraged Patient Goals/Self-Care Activities:: . Call office if I gain more than 2 pounds in one day or 5 pounds in one week . Keep legs up while sitting and watch for swelling in feet, ankles and legs every day . Weigh myself daily and write in log, taking log to medical appointments for provider review . Discuss productive cough with primary care provider . Continue with home peritoneal dialysis and discuss with nephrologist abdominal bloating during treatments and possibility to change solutions Follow Up Plan: The care management team will reach out  to the patient again over the next 20 business days.     Care Plan : Hypertension (Adult)  Updates made by Leona Singleton, RN since 08/15/2020 12:00 AM  Problem: Resistant Hypertension (Hypertension)   Priority: Medium  Long-Range Goal: Patient will report no hypertensive episodes of SBP >200   Start Date: 08/15/2020  Expected End Date: 02/20/2021  Priority: Medium  Objective:  . Last practice recorded BP readings:  BP Readings from Last 3 Encounters:  08/09/20 (!) 158/60  07/31/20 (!) 180/70  07/16/20 (!) 176/66  Current Barriers:  Marland Kitchen Knowledge Deficits related to  basic understanding of hypertension pathophysiology and self care management Difficulties managing hypertension as evidenced by patient reporting systolic blood pressures of 200's on a daily basis.  Patient does report blood pressure ranges have been better since hospitalization and initiation of new medications. Blood pressure this morning was 184/85 prior to medication and then 160/65 after medication.  States SBP has not been up to 200 since discharge. Case Manager Clinical Goal(s):  . patient will verbalize understanding of plan for hypertension management . patient will demonstrate improved adherence to prescribed treatment plan for hypertension as evidenced by taking all medications as prescribed, monitoring and recording blood pressure as directed, adhering to low sodium/DASH diet Interventions:  . Collaboration with Crecencio Mc, MD regarding development and update of comprehensive plan of care as evidenced by provider attestation and co-signature . Inter-disciplinary care team collaboration (see longitudinal plan of care) . Evaluation of current treatment plan related to hypertension self management and patient's adherence to plan as established by provider. . Provided education to patient re: stroke prevention, s/s of heart attack and stroke, DASH diet, complications of uncontrolled blood pressure . Reviewed medications with patient and discussed importance of compliance . Discussed plans with patient for ongoing care management follow up and provided patient with direct contact information for care management team . Advised patient, providing education and rationale, to monitor blood pressure daily and record, calling PCP for findings outside established parameters.  . Reviewed scheduled/upcoming provider appointments including:  Post hospital follow up on 08/20/20 with primary care provider  Screen for signs and symptoms of depression; if present, refer for or complete a comprehensive  assessment.   Evaluate social and economic barriers that may affect adherence to treatment plan   Reviewed current blood pressures and encouraged patient to contact provider for elevations SBP>200 after taking medications  Discussed low salt diet and food options  Encouraged to increase activity as tolerated  Sending EMMI on hypertension Patient Goals/Self-Care Activities: . Check blood pressure daily . Write blood pressure results in a log and take log to appointments for provider review . Low salt heart healthy renal diet . If blood pressure elevated prior to taking medication, recheck blood pressure about 2 hours after taking medicine and write in log . Take medications as prescribed . Speak with provider about goal blood pressure Follow Up Plan: The care management team will reach out to the patient again over the next 20 business days.       Plan:The care management team will reach out to the patient again over the next 20 business days.  Hubert Azure RN, MSN RN Care Management Coordinator [place] [phone] [email or web address]

## 2020-08-19 DIAGNOSIS — D509 Iron deficiency anemia, unspecified: Secondary | ICD-10-CM | POA: Diagnosis not present

## 2020-08-19 DIAGNOSIS — N2581 Secondary hyperparathyroidism of renal origin: Secondary | ICD-10-CM | POA: Diagnosis not present

## 2020-08-19 DIAGNOSIS — D631 Anemia in chronic kidney disease: Secondary | ICD-10-CM | POA: Diagnosis not present

## 2020-08-19 DIAGNOSIS — Z992 Dependence on renal dialysis: Secondary | ICD-10-CM | POA: Diagnosis not present

## 2020-08-19 DIAGNOSIS — N186 End stage renal disease: Secondary | ICD-10-CM | POA: Diagnosis not present

## 2020-08-20 ENCOUNTER — Encounter: Payer: Self-pay | Admitting: Internal Medicine

## 2020-08-20 ENCOUNTER — Other Ambulatory Visit: Payer: Self-pay

## 2020-08-20 ENCOUNTER — Ambulatory Visit (INDEPENDENT_AMBULATORY_CARE_PROVIDER_SITE_OTHER): Payer: Medicare Other | Admitting: Internal Medicine

## 2020-08-20 DIAGNOSIS — Z992 Dependence on renal dialysis: Secondary | ICD-10-CM | POA: Diagnosis not present

## 2020-08-20 DIAGNOSIS — I120 Hypertensive chronic kidney disease with stage 5 chronic kidney disease or end stage renal disease: Secondary | ICD-10-CM

## 2020-08-20 DIAGNOSIS — Z09 Encounter for follow-up examination after completed treatment for conditions other than malignant neoplasm: Secondary | ICD-10-CM | POA: Diagnosis not present

## 2020-08-20 DIAGNOSIS — D631 Anemia in chronic kidney disease: Secondary | ICD-10-CM | POA: Diagnosis not present

## 2020-08-20 DIAGNOSIS — E1169 Type 2 diabetes mellitus with other specified complication: Secondary | ICD-10-CM

## 2020-08-20 DIAGNOSIS — N2581 Secondary hyperparathyroidism of renal origin: Secondary | ICD-10-CM | POA: Diagnosis not present

## 2020-08-20 DIAGNOSIS — E785 Hyperlipidemia, unspecified: Secondary | ICD-10-CM

## 2020-08-20 DIAGNOSIS — N185 Chronic kidney disease, stage 5: Secondary | ICD-10-CM | POA: Diagnosis not present

## 2020-08-20 DIAGNOSIS — I639 Cerebral infarction, unspecified: Secondary | ICD-10-CM

## 2020-08-20 DIAGNOSIS — N186 End stage renal disease: Secondary | ICD-10-CM | POA: Diagnosis not present

## 2020-08-20 DIAGNOSIS — I9589 Other hypotension: Secondary | ICD-10-CM

## 2020-08-20 DIAGNOSIS — D509 Iron deficiency anemia, unspecified: Secondary | ICD-10-CM | POA: Diagnosis not present

## 2020-08-20 NOTE — Assessment & Plan Note (Signed)
Symptomatic,  Since adding irbesartan.  Weaning clonidine to off over the next 3 days.  He has CHRONIC intolerance to normotension due to diffuse cerebrovascular disease

## 2020-08-20 NOTE — Assessment & Plan Note (Signed)
Patient is hypotension post discharge due to change in medication regimen;  This was  Addressed today with a structured clonidine wean and a 2 week follow up   > he has no new issues or questions about his discharge plans

## 2020-08-20 NOTE — Progress Notes (Signed)
Subjective:  Patient ID: [name], male    DOB: 07-09-1945  Age: 75 y.o. MRN: [id number]  CC: Diagnoses of Hypotension, iatrogenic, Hospital discharge follow-up, Type 2 diabetes mellitus with hyperlipidemia ([place]), Renal sclerosis with hypertension, stage 5 chronic kidney disease or end stage renal disease ([place]), and CKD (chronic kidney disease), stage V ([place]) were pertinent to this visit.  HPI [name] presents for hospital follow up    This visit occurred during the SARS-CoV-2 public health emergency.  Safety protocols were in place, including screening questions prior to the visit, additional usage of staff PPE, and extensive cleaning of exam room while observing appropriate contact time as indicated for disinfecting solutions.   [name] was admitted to [facility] on March 16 with hypertensive urgency and fluid overload.  home BP readings  Had become quite elevated over his usual SBP of 150-160.  He recalls readings of 607 systolic and wife called nephrologist who directed him to the ER.     He was diuresed aggressively and weight  was reduced to 142 lbs (12 lbs  ) by day of discharge on March 19.  Prn labetalol and scheduled irbesartan were added to his 5 drug BP regimen .  His dialysis catheter clotted during hospitalization and was resolved.  All labs , imaging studies and progress notes from admission were reviewed with patient today   He has not felt well since discharge. He has not been tolerating the medication regimen since discharge due to feeling very lethargic,  And  today in THE OFFICE HIS BP IS  108/66.  He is feeling mildly orthostatic with walking.   His BP at home this morning before his medications, after completing overnight peritoneal dialysis,  was 371 systolic,  Which is the goal given his extensive cerebrovascular disease.  He notes that his morning readings are the lowest and his after noon readings are somewhat higher.  Usually 170 to 180  /70 before he takes  the medications   BP regime reviewed:  Am :  Clonidine carvedilol cardura amlodipine, Imdur and irbesartan.  Clonidine dose is 0.2 mg tid.   evening regimen:  Carvedilol and clonidine    Not sleeping well  Has been having an episodic Productive cough with thick tenacious phlegm that bothers him when he lies supine.  "It's like I keep a cold all the time"  Sometimes expectorates without even coughing.  Still smoking.  Has not had a chest CT or pulmonology eval in a couple of years, by choice.    2 chest rays were done in house,  No pneumonia , ,  But extensive COPD changes   ESKD:  Using peritoneal dialysis nightly   HTN: see above. He has a long history of INTOLERANCE to normotensive readings due to extensive cerebrovascular disease resulting in a low flow state      Outpatient Medications Prior to Visit  Medication Sig Dispense Refill  . albuterol (VENTOLIN HFA) 108 (90 Base) MCG/ACT inhaler Inhale 2 puffs into the lungs every 6 (six) hours as needed for wheezing or shortness of breath. 18 g 0  . amLODipine (NORVASC) 10 MG tablet TAKE 1 TABLET BY MOUTH ONCE DAILY 90 tablet 1  . aspirin 81 MG tablet Take 1 tablet (81 mg total) by mouth daily. 30 tablet 11  . calcitRIOL (ROCALTROL) 0.25 MCG capsule Take 0.25 mcg by mouth every Monday, Wednesday, and Friday.  3  . carvedilol (COREG) 25 MG tablet Take 25 mg by mouth  2 (two) times daily with a meal.  6  . clopidogrel (PLAVIX) 75 MG tablet Take 1 tablet (75 mg total) by mouth daily. 90 tablet 1  . diphenhydrAMINE (BENADRYL) 25 MG tablet Take 25 mg by mouth every 6 (six) hours as needed for allergies.    Marland Kitchen doxazosin (CARDURA) 8 MG tablet Take 8 mg by mouth daily.    Marland Kitchen ezetimibe (ZETIA) 10 MG tablet Take 1 tablet (10 mg total) by mouth daily. 90 tablet 3  . fluticasone (FLONASE) 50 MCG/ACT nasal spray Place 2 sprays into both nostrils daily. 16 g 6  . irbesartan (AVAPRO) 300 MG tablet Take 1 tablet (300 mg total) by mouth daily. 30 tablet 0  .  multivitamin (RENA-VIT) TABS tablet Take 1 tablet by mouth at bedtime.    . nicotine (NICODERM CQ - DOSED IN MG/24 HOURS) 14 mg/24hr patch Uses occassionally    . rosuvastatin (CRESTOR) 20 MG tablet Take 1 tablet (20 mg total) by mouth at bedtime. 90 tablet 1  . torsemide (DEMADEX) 20 MG tablet Take 20 mg by mouth 2 (two) times daily.    . cloNIDine (CATAPRES) 0.2 MG tablet Take 0.2 mg by mouth 3 (three) times daily.    Marland Kitchen torsemide 40 MG TABS Take 40 mg by mouth daily. (Patient not taking: Reported on 08/20/2020) 30 tablet 0   Facility-Administered Medications Prior to Visit  Medication Dose Route Frequency Provider Last Rate Last Admin  . ipratropium-albuterol (DUONEB) 0.5-2.5 (3) MG/3ML nebulizer solution 3 mL  3 mL Nebulization Q6H Crecencio Mc, MD        Review of Systems;  Patient denies headache, fevers, malaise, unintentional weight loss, skin rash, eye pain, sinus congestion and sinus pain, sore throat, dysphagia,  hemoptysis , cough, dyspnea, wheezing, chest pain, palpitations, orthopnea, edema, abdominal pain, nausea, melena, diarrhea, constipation, flank pain, dysuria, hematuria, urinary  Frequency, nocturia, numbness, tingling, seizures,  Focal weakness, Loss of consciousness,  Tremor, insomnia, depression, anxiety, and suicidal ideation.      Objective:  BP 108/66 (BP Location: Right Arm, Patient Position: Sitting, Cuff Size: Normal)   Pulse 65   Temp 97.6 F (36.4 C) (Oral)   Resp 15   Ht 5\' 6"  (1.676 m)   Wt 144 lb 1.9 oz (65.4 kg)   SpO2 97%   BMI 23.26 kg/m   BP Readings from Last 3 Encounters:  08/20/20 108/66  08/09/20 (!) 158/60  07/31/20 (!) 180/70    Wt Readings from Last 3 Encounters:  08/20/20 144 lb 1.9 oz (65.4 kg)  08/09/20 140 lb 3.2 oz (63.6 kg)  07/31/20 154 lb (69.9 kg)    General appearance: lethargic , cooperative and appears stated age Ears: normal TM's and external ear canals both ears Throat: lips, mucosa, and tongue normal; teeth and  gums normal Neck: no adenopathy, no carotid bruit, supple, symmetrical, trachea midline and thyroid not enlarged, symmetric, no tenderness/mass/nodules Back: symmetric, no curvature. ROM normal. No CVA tenderness. Lungs: clear to auscultation bilaterally Heart: regular rate and rhythm, S1, S2 normal, no murmur, click, rub or gallop Abdomen: soft, non-tender; bowel sounds normal; no masses,  no organomegaly Pulses: 2+ and symmetric Skin: Skin color, texture, turgor normal. No rashes or lesions Lymph nodes: Cervical, supraclavicular, and axillary nodes normal.  Lab Results  Component Value Date   HGBA1C 6.3 (H) 08/06/2020   HGBA1C 5.9 (H) 05/12/2020   HGBA1C 6.2 (H) 01/14/2020    Lab Results  Component Value Date   CREATININE 6.44 (H)  08/09/2020   CREATININE 6.62 (H) 08/08/2020   CREATININE 6.70 (H) 08/07/2020    Lab Results  Component Value Date   WBC 7.8 08/09/2020   HGB 10.9 (L) 08/09/2020   HCT 31.6 (L) 08/09/2020   PLT 163 08/09/2020   GLUCOSE 153 (H) 08/09/2020   CHOL 122 05/12/2020   TRIG 180 (H) 05/12/2020   HDL 25 (L) 05/12/2020   LDLDIRECT 134.0 05/11/2017   LDLCALC 61 05/12/2020   ALT 10 01/14/2020   AST 9 (L) 01/14/2020   NA 138 08/09/2020   K 3.5 08/09/2020   CL 106 08/09/2020   CREATININE 6.44 (H) 08/09/2020   BUN 38 (H) 08/09/2020   CO2 25 08/09/2020   TSH 1.555 05/11/2020   PSA 9.98 (H) 01/21/2020   INR 1.0 01/14/2020   HGBA1C 6.3 (H) 08/06/2020   MICROALBUR 78.6 (H) 05/11/2017    DG Chest 2 View  Result Date: 08/07/2020 CLINICAL DATA:  Shortness of breath for 1 week EXAM: CHEST - 2 VIEW COMPARISON:  08/06/2020 FINDINGS: Stable mild cardiomegaly. Atherosclerotic calcification of the aortic knob. Chronic coarsened interstitial markings. No new focal airspace consolidation. No pleural effusion or pneumothorax. IMPRESSION: No active cardiopulmonary disease. Electronically Signed   By: Davina Poke D.O.   On: 08/07/2020 12:00   DG Chest 2  View  Result Date: 08/06/2020 CLINICAL DATA:  Shortness of breath. EXAM: CHEST - 2 VIEW COMPARISON:  May 11, 2020 FINDINGS: The lungs are hyperinflated. Chronic appearing increased lung markings are seen. Mild blunting of the left costophrenic angle is noted. There is no evidence of a pneumothorax. The cardiac silhouette is mildly enlarged. Moderate severity calcification of the aortic arch is seen. The visualized skeletal structures are unremarkable. IMPRESSION: Stable cardiomegaly with a very small left pleural effusion versus pleural thickening. Electronically Signed   By: Virgina Norfolk M.D.   On: 08/06/2020 18:35   DG Abd 1 View  Result Date: 08/07/2020 CLINICAL DATA:  Evaluate peritoneal dialysis catheter EXAM: ABDOMEN - 1 VIEW COMPARISON:  12/11/2012 FINDINGS: Peritoneal dialysis catheter is coiled within the pelvis, to the right of midline. The bowel gas pattern is normal. No radio-opaque calculi or other significant radiographic abnormality are seen. Surgical clips in the left hemiabdomen. IMPRESSION: 1. Nonobstructive bowel gas pattern. 2. Peritoneal dialysis catheter is coiled within the pelvis, to the right of midline. Electronically Signed   By: Davina Poke D.O.   On: 08/07/2020 12:13    Assessment & Plan:   Problem List Items Addressed This Visit      Unprioritized   Type 2 diabetes mellitus with hyperlipidemia ([place])    His diabetes remains Controlled without medications secondary to weight loss .He has had hyperkalemia secondary to an ARB prior to use of dialysis  And is tolerating statin Chauncey Cruel for primary prevention of events but has extensive atherosclerosis involving coronaries and cerebrovascular arteries    Lab Results  Component Value Date   HGBA1C 6.3 (H) 08/06/2020          Relevant Medications   torsemide (DEMADEX) 20 MG tablet   Hypertensive renal sclerosis with hypertension    He does not tolerate normotension due to low flow state induced by  advanced atherosclerosis. Reducing clonidine dose gradually with planned wean.  Continue other meds      Relevant Medications   torsemide (DEMADEX) 20 MG tablet   Hypotension, iatrogenic    Symptomatic,  Since adding irbesartan.  Weaning clonidine to off over the next 3 days.  He has  CHRONIC intolerance to normotension due to diffuse cerebrovascular disease       CKD (chronic kidney disease), stage V ([place])    Secondary to uncontrolled hypertension.  Continue peritoneal dialysis  Lab Results  Component Value Date   NA 138 08/09/2020   K 3.5 08/09/2020   CL 106 08/09/2020   CO2 25 08/09/2020   Lab Results  Component Value Date   CREATININE 6.44 (H) 08/09/2020         Hospital discharge follow-up    Patient is hypotension post discharge due to change in medication regimen;  This was  Addressed today with a structured clonidine wean and a 2 week follow up   > he has no new issues or questions about his discharge plans         I have discontinued Nagee L. Emig's cloNIDine. I am also having him maintain his aspirin, diphenhydrAMINE, carvedilol, calcitRIOL, fluticasone, albuterol, ezetimibe, multivitamin, doxazosin, clopidogrel, rosuvastatin, amLODipine, nicotine, irbesartan, and torsemide. We will continue to administer ipratropium-albuterol.  No orders of the defined types were placed in this encounter.   Medications Discontinued During This Encounter  Medication Reason  . torsemide 40 MG TABS   . cloNIDine (CATAPRES) 0.2 MG tablet     Follow-up: Return in about 2 weeks (around 09/03/2020).   Crecencio Mc, MD

## 2020-08-20 NOTE — Patient Instructions (Addendum)
  Your blood pressure is too low!   We are going to wean you off of clonidine first.  :    Starting with this afternoon , skip the afternoon dose of clonidine  And reduce your evening dose  to 1/2 tablet    Starting tomorrow,  DO NOT TAKE ANY  morning clonidine dose .  Take 1/2 tablet in the afternoon  And 1/2 tablet in the evening    Starting Friday,  No morning or afternoon.  Only a nighttime dose of 1/t tablet .  ( stop clonidine after that )  Saturday:  NO MORE CLONIDINE UNLESS BP IS 170 OR HIGHER  .

## 2020-08-21 DIAGNOSIS — N2581 Secondary hyperparathyroidism of renal origin: Secondary | ICD-10-CM | POA: Diagnosis not present

## 2020-08-21 DIAGNOSIS — D509 Iron deficiency anemia, unspecified: Secondary | ICD-10-CM | POA: Diagnosis not present

## 2020-08-21 DIAGNOSIS — D631 Anemia in chronic kidney disease: Secondary | ICD-10-CM | POA: Diagnosis not present

## 2020-08-21 DIAGNOSIS — N186 End stage renal disease: Secondary | ICD-10-CM | POA: Diagnosis not present

## 2020-08-21 DIAGNOSIS — Z992 Dependence on renal dialysis: Secondary | ICD-10-CM | POA: Diagnosis not present

## 2020-08-22 DIAGNOSIS — N186 End stage renal disease: Secondary | ICD-10-CM | POA: Diagnosis not present

## 2020-08-22 DIAGNOSIS — D509 Iron deficiency anemia, unspecified: Secondary | ICD-10-CM | POA: Diagnosis not present

## 2020-08-22 DIAGNOSIS — Z992 Dependence on renal dialysis: Secondary | ICD-10-CM | POA: Diagnosis not present

## 2020-08-22 DIAGNOSIS — D631 Anemia in chronic kidney disease: Secondary | ICD-10-CM | POA: Diagnosis not present

## 2020-08-22 DIAGNOSIS — N2581 Secondary hyperparathyroidism of renal origin: Secondary | ICD-10-CM | POA: Diagnosis not present

## 2020-08-23 ENCOUNTER — Encounter: Payer: Self-pay | Admitting: Internal Medicine

## 2020-08-23 DIAGNOSIS — Z992 Dependence on renal dialysis: Secondary | ICD-10-CM | POA: Diagnosis not present

## 2020-08-23 DIAGNOSIS — D509 Iron deficiency anemia, unspecified: Secondary | ICD-10-CM | POA: Diagnosis not present

## 2020-08-23 DIAGNOSIS — N2581 Secondary hyperparathyroidism of renal origin: Secondary | ICD-10-CM | POA: Diagnosis not present

## 2020-08-23 DIAGNOSIS — N186 End stage renal disease: Secondary | ICD-10-CM | POA: Diagnosis not present

## 2020-08-23 DIAGNOSIS — D631 Anemia in chronic kidney disease: Secondary | ICD-10-CM | POA: Diagnosis not present

## 2020-08-23 NOTE — Assessment & Plan Note (Signed)
Secondary to uncontrolled hypertension.  Continue peritoneal dialysis  Lab Results  Component Value Date   NA 138 08/09/2020   K 3.5 08/09/2020   CL 106 08/09/2020   CO2 25 08/09/2020   Lab Results  Component Value Date   CREATININE 6.44 (H) 08/09/2020

## 2020-08-23 NOTE — Assessment & Plan Note (Addendum)
His diabetes remains Controlled without medications secondary to weight loss .He has had hyperkalemia secondary to an ARB prior to use of dialysis  And is tolerating statin [name] for primary prevention of events but has extensive atherosclerosis involving coronaries and cerebrovascular arteries    Lab Results  Component Value Date   HGBA1C 6.3 (H) 08/06/2020

## 2020-08-23 NOTE — Assessment & Plan Note (Signed)
He does not tolerate normotension due to low flow state induced by advanced atherosclerosis. Reducing clonidine dose gradually with planned wean.  Continue other meds

## 2020-08-24 DIAGNOSIS — D509 Iron deficiency anemia, unspecified: Secondary | ICD-10-CM | POA: Diagnosis not present

## 2020-08-24 DIAGNOSIS — N2581 Secondary hyperparathyroidism of renal origin: Secondary | ICD-10-CM | POA: Diagnosis not present

## 2020-08-24 DIAGNOSIS — D631 Anemia in chronic kidney disease: Secondary | ICD-10-CM | POA: Diagnosis not present

## 2020-08-24 DIAGNOSIS — N186 End stage renal disease: Secondary | ICD-10-CM | POA: Diagnosis not present

## 2020-08-24 DIAGNOSIS — Z992 Dependence on renal dialysis: Secondary | ICD-10-CM | POA: Diagnosis not present

## 2020-08-25 DIAGNOSIS — D631 Anemia in chronic kidney disease: Secondary | ICD-10-CM | POA: Diagnosis not present

## 2020-08-25 DIAGNOSIS — E119 Type 2 diabetes mellitus without complications: Secondary | ICD-10-CM | POA: Diagnosis not present

## 2020-08-25 DIAGNOSIS — D509 Iron deficiency anemia, unspecified: Secondary | ICD-10-CM | POA: Diagnosis not present

## 2020-08-25 DIAGNOSIS — N186 End stage renal disease: Secondary | ICD-10-CM | POA: Diagnosis not present

## 2020-08-25 DIAGNOSIS — N2581 Secondary hyperparathyroidism of renal origin: Secondary | ICD-10-CM | POA: Diagnosis not present

## 2020-08-25 DIAGNOSIS — Z992 Dependence on renal dialysis: Secondary | ICD-10-CM | POA: Diagnosis not present

## 2020-08-26 DIAGNOSIS — N2581 Secondary hyperparathyroidism of renal origin: Secondary | ICD-10-CM | POA: Diagnosis not present

## 2020-08-26 DIAGNOSIS — Z992 Dependence on renal dialysis: Secondary | ICD-10-CM | POA: Diagnosis not present

## 2020-08-26 DIAGNOSIS — N186 End stage renal disease: Secondary | ICD-10-CM | POA: Diagnosis not present

## 2020-08-26 DIAGNOSIS — D509 Iron deficiency anemia, unspecified: Secondary | ICD-10-CM | POA: Diagnosis not present

## 2020-08-26 DIAGNOSIS — D631 Anemia in chronic kidney disease: Secondary | ICD-10-CM | POA: Diagnosis not present

## 2020-08-27 DIAGNOSIS — N2581 Secondary hyperparathyroidism of renal origin: Secondary | ICD-10-CM | POA: Diagnosis not present

## 2020-08-27 DIAGNOSIS — D509 Iron deficiency anemia, unspecified: Secondary | ICD-10-CM | POA: Diagnosis not present

## 2020-08-27 DIAGNOSIS — N186 End stage renal disease: Secondary | ICD-10-CM | POA: Diagnosis not present

## 2020-08-27 DIAGNOSIS — Z992 Dependence on renal dialysis: Secondary | ICD-10-CM | POA: Diagnosis not present

## 2020-08-27 DIAGNOSIS — D631 Anemia in chronic kidney disease: Secondary | ICD-10-CM | POA: Diagnosis not present

## 2020-08-28 DIAGNOSIS — N186 End stage renal disease: Secondary | ICD-10-CM | POA: Diagnosis not present

## 2020-08-28 DIAGNOSIS — N2581 Secondary hyperparathyroidism of renal origin: Secondary | ICD-10-CM | POA: Diagnosis not present

## 2020-08-28 DIAGNOSIS — D631 Anemia in chronic kidney disease: Secondary | ICD-10-CM | POA: Diagnosis not present

## 2020-08-28 DIAGNOSIS — Z992 Dependence on renal dialysis: Secondary | ICD-10-CM | POA: Diagnosis not present

## 2020-08-28 DIAGNOSIS — D509 Iron deficiency anemia, unspecified: Secondary | ICD-10-CM | POA: Diagnosis not present

## 2020-08-29 ENCOUNTER — Telehealth: Payer: Medicare Other

## 2020-08-29 ENCOUNTER — Telehealth: Payer: Self-pay | Admitting: *Deleted

## 2020-08-29 DIAGNOSIS — D631 Anemia in chronic kidney disease: Secondary | ICD-10-CM | POA: Diagnosis not present

## 2020-08-29 DIAGNOSIS — D509 Iron deficiency anemia, unspecified: Secondary | ICD-10-CM | POA: Diagnosis not present

## 2020-08-29 DIAGNOSIS — N186 End stage renal disease: Secondary | ICD-10-CM | POA: Diagnosis not present

## 2020-08-29 DIAGNOSIS — N2581 Secondary hyperparathyroidism of renal origin: Secondary | ICD-10-CM | POA: Diagnosis not present

## 2020-08-29 DIAGNOSIS — Z992 Dependence on renal dialysis: Secondary | ICD-10-CM | POA: Diagnosis not present

## 2020-08-29 NOTE — Telephone Encounter (Signed)
  Chronic Care Management   Outreach Note  08/29/2020 Name: [name] MRN: [id number] DOB: 21-Sep-1945  Referred by: Crecencio Mc, MD Reason for referral : Chronic Care Management (HTN, CHF, ESRD)   An unsuccessful telephone outreach was attempted today. The patient was referred to the case management team for assistance with care management and care coordination.   Follow Up Plan: RNCM will seek assistance from Care Gu[facility] to help reschedule this appointment within the next 2 weeks if possible.  Hubert Azure RN, MSN RN Care Management Coordinator [place] [phone] [email or web address]

## 2020-08-30 DIAGNOSIS — D631 Anemia in chronic kidney disease: Secondary | ICD-10-CM | POA: Diagnosis not present

## 2020-08-30 DIAGNOSIS — N2581 Secondary hyperparathyroidism of renal origin: Secondary | ICD-10-CM | POA: Diagnosis not present

## 2020-08-30 DIAGNOSIS — D509 Iron deficiency anemia, unspecified: Secondary | ICD-10-CM | POA: Diagnosis not present

## 2020-08-30 DIAGNOSIS — N186 End stage renal disease: Secondary | ICD-10-CM | POA: Diagnosis not present

## 2020-08-30 DIAGNOSIS — Z992 Dependence on renal dialysis: Secondary | ICD-10-CM | POA: Diagnosis not present

## 2020-08-31 DIAGNOSIS — Z992 Dependence on renal dialysis: Secondary | ICD-10-CM | POA: Diagnosis not present

## 2020-08-31 DIAGNOSIS — N2581 Secondary hyperparathyroidism of renal origin: Secondary | ICD-10-CM | POA: Diagnosis not present

## 2020-08-31 DIAGNOSIS — D509 Iron deficiency anemia, unspecified: Secondary | ICD-10-CM | POA: Diagnosis not present

## 2020-08-31 DIAGNOSIS — D631 Anemia in chronic kidney disease: Secondary | ICD-10-CM | POA: Diagnosis not present

## 2020-08-31 DIAGNOSIS — N186 End stage renal disease: Secondary | ICD-10-CM | POA: Diagnosis not present

## 2020-09-01 DIAGNOSIS — D509 Iron deficiency anemia, unspecified: Secondary | ICD-10-CM | POA: Diagnosis not present

## 2020-09-01 DIAGNOSIS — Z992 Dependence on renal dialysis: Secondary | ICD-10-CM | POA: Diagnosis not present

## 2020-09-01 DIAGNOSIS — N186 End stage renal disease: Secondary | ICD-10-CM | POA: Diagnosis not present

## 2020-09-01 DIAGNOSIS — D631 Anemia in chronic kidney disease: Secondary | ICD-10-CM | POA: Diagnosis not present

## 2020-09-01 DIAGNOSIS — N2581 Secondary hyperparathyroidism of renal origin: Secondary | ICD-10-CM | POA: Diagnosis not present

## 2020-09-02 DIAGNOSIS — N186 End stage renal disease: Secondary | ICD-10-CM | POA: Diagnosis not present

## 2020-09-02 DIAGNOSIS — D509 Iron deficiency anemia, unspecified: Secondary | ICD-10-CM | POA: Diagnosis not present

## 2020-09-02 DIAGNOSIS — N2581 Secondary hyperparathyroidism of renal origin: Secondary | ICD-10-CM | POA: Diagnosis not present

## 2020-09-02 DIAGNOSIS — Z992 Dependence on renal dialysis: Secondary | ICD-10-CM | POA: Diagnosis not present

## 2020-09-02 DIAGNOSIS — D631 Anemia in chronic kidney disease: Secondary | ICD-10-CM | POA: Diagnosis not present

## 2020-09-02 NOTE — Telephone Encounter (Signed)
Please reschedule RN CM at [facility]

## 2020-09-03 DIAGNOSIS — D631 Anemia in chronic kidney disease: Secondary | ICD-10-CM | POA: Diagnosis not present

## 2020-09-03 DIAGNOSIS — D509 Iron deficiency anemia, unspecified: Secondary | ICD-10-CM | POA: Diagnosis not present

## 2020-09-03 DIAGNOSIS — Z992 Dependence on renal dialysis: Secondary | ICD-10-CM | POA: Diagnosis not present

## 2020-09-03 DIAGNOSIS — N2581 Secondary hyperparathyroidism of renal origin: Secondary | ICD-10-CM | POA: Diagnosis not present

## 2020-09-03 DIAGNOSIS — N186 End stage renal disease: Secondary | ICD-10-CM | POA: Diagnosis not present

## 2020-09-03 NOTE — Telephone Encounter (Signed)
Patient rescheduled for 09/17/2020

## 2020-09-04 ENCOUNTER — Ambulatory Visit: Payer: Medicare Other | Admitting: Internal Medicine

## 2020-09-04 DIAGNOSIS — Z992 Dependence on renal dialysis: Secondary | ICD-10-CM | POA: Diagnosis not present

## 2020-09-04 DIAGNOSIS — Z0289 Encounter for other administrative examinations: Secondary | ICD-10-CM

## 2020-09-04 DIAGNOSIS — N2581 Secondary hyperparathyroidism of renal origin: Secondary | ICD-10-CM | POA: Diagnosis not present

## 2020-09-04 DIAGNOSIS — D509 Iron deficiency anemia, unspecified: Secondary | ICD-10-CM | POA: Diagnosis not present

## 2020-09-04 DIAGNOSIS — N186 End stage renal disease: Secondary | ICD-10-CM | POA: Diagnosis not present

## 2020-09-04 DIAGNOSIS — D631 Anemia in chronic kidney disease: Secondary | ICD-10-CM | POA: Diagnosis not present

## 2020-09-05 DIAGNOSIS — N186 End stage renal disease: Secondary | ICD-10-CM | POA: Diagnosis not present

## 2020-09-05 DIAGNOSIS — N2581 Secondary hyperparathyroidism of renal origin: Secondary | ICD-10-CM | POA: Diagnosis not present

## 2020-09-05 DIAGNOSIS — D509 Iron deficiency anemia, unspecified: Secondary | ICD-10-CM | POA: Diagnosis not present

## 2020-09-05 DIAGNOSIS — Z992 Dependence on renal dialysis: Secondary | ICD-10-CM | POA: Diagnosis not present

## 2020-09-05 DIAGNOSIS — D631 Anemia in chronic kidney disease: Secondary | ICD-10-CM | POA: Diagnosis not present

## 2020-09-06 DIAGNOSIS — D509 Iron deficiency anemia, unspecified: Secondary | ICD-10-CM | POA: Diagnosis not present

## 2020-09-06 DIAGNOSIS — N186 End stage renal disease: Secondary | ICD-10-CM | POA: Diagnosis not present

## 2020-09-06 DIAGNOSIS — N2581 Secondary hyperparathyroidism of renal origin: Secondary | ICD-10-CM | POA: Diagnosis not present

## 2020-09-06 DIAGNOSIS — Z992 Dependence on renal dialysis: Secondary | ICD-10-CM | POA: Diagnosis not present

## 2020-09-06 DIAGNOSIS — D631 Anemia in chronic kidney disease: Secondary | ICD-10-CM | POA: Diagnosis not present

## 2020-09-07 DIAGNOSIS — Z992 Dependence on renal dialysis: Secondary | ICD-10-CM | POA: Diagnosis not present

## 2020-09-07 DIAGNOSIS — D509 Iron deficiency anemia, unspecified: Secondary | ICD-10-CM | POA: Diagnosis not present

## 2020-09-07 DIAGNOSIS — N2581 Secondary hyperparathyroidism of renal origin: Secondary | ICD-10-CM | POA: Diagnosis not present

## 2020-09-07 DIAGNOSIS — D631 Anemia in chronic kidney disease: Secondary | ICD-10-CM | POA: Diagnosis not present

## 2020-09-07 DIAGNOSIS — N186 End stage renal disease: Secondary | ICD-10-CM | POA: Diagnosis not present

## 2020-09-08 ENCOUNTER — Other Ambulatory Visit: Payer: Medicare Other

## 2020-09-08 ENCOUNTER — Other Ambulatory Visit: Payer: Self-pay

## 2020-09-08 DIAGNOSIS — D509 Iron deficiency anemia, unspecified: Secondary | ICD-10-CM | POA: Diagnosis not present

## 2020-09-08 DIAGNOSIS — Z992 Dependence on renal dialysis: Secondary | ICD-10-CM | POA: Diagnosis not present

## 2020-09-08 DIAGNOSIS — N186 End stage renal disease: Secondary | ICD-10-CM | POA: Diagnosis not present

## 2020-09-08 DIAGNOSIS — C61 Malignant neoplasm of prostate: Secondary | ICD-10-CM

## 2020-09-08 DIAGNOSIS — D631 Anemia in chronic kidney disease: Secondary | ICD-10-CM | POA: Diagnosis not present

## 2020-09-08 DIAGNOSIS — N2581 Secondary hyperparathyroidism of renal origin: Secondary | ICD-10-CM | POA: Diagnosis not present

## 2020-09-09 DIAGNOSIS — N2581 Secondary hyperparathyroidism of renal origin: Secondary | ICD-10-CM | POA: Diagnosis not present

## 2020-09-09 DIAGNOSIS — Z992 Dependence on renal dialysis: Secondary | ICD-10-CM | POA: Diagnosis not present

## 2020-09-09 DIAGNOSIS — D509 Iron deficiency anemia, unspecified: Secondary | ICD-10-CM | POA: Diagnosis not present

## 2020-09-09 DIAGNOSIS — N186 End stage renal disease: Secondary | ICD-10-CM | POA: Diagnosis not present

## 2020-09-09 DIAGNOSIS — D631 Anemia in chronic kidney disease: Secondary | ICD-10-CM | POA: Diagnosis not present

## 2020-09-09 LAB — PSA: Prostate Specific Ag, Serum: 8 ng/mL — ABNORMAL HIGH (ref 0.0–4.0)

## 2020-09-10 DIAGNOSIS — Z992 Dependence on renal dialysis: Secondary | ICD-10-CM | POA: Diagnosis not present

## 2020-09-10 DIAGNOSIS — D631 Anemia in chronic kidney disease: Secondary | ICD-10-CM | POA: Diagnosis not present

## 2020-09-10 DIAGNOSIS — N186 End stage renal disease: Secondary | ICD-10-CM | POA: Diagnosis not present

## 2020-09-10 DIAGNOSIS — D509 Iron deficiency anemia, unspecified: Secondary | ICD-10-CM | POA: Diagnosis not present

## 2020-09-10 DIAGNOSIS — N2581 Secondary hyperparathyroidism of renal origin: Secondary | ICD-10-CM | POA: Diagnosis not present

## 2020-09-11 DIAGNOSIS — N186 End stage renal disease: Secondary | ICD-10-CM | POA: Diagnosis not present

## 2020-09-11 DIAGNOSIS — N2581 Secondary hyperparathyroidism of renal origin: Secondary | ICD-10-CM | POA: Diagnosis not present

## 2020-09-11 DIAGNOSIS — Z992 Dependence on renal dialysis: Secondary | ICD-10-CM | POA: Diagnosis not present

## 2020-09-11 DIAGNOSIS — D509 Iron deficiency anemia, unspecified: Secondary | ICD-10-CM | POA: Diagnosis not present

## 2020-09-11 DIAGNOSIS — D631 Anemia in chronic kidney disease: Secondary | ICD-10-CM | POA: Diagnosis not present

## 2020-09-12 DIAGNOSIS — N186 End stage renal disease: Secondary | ICD-10-CM | POA: Diagnosis not present

## 2020-09-12 DIAGNOSIS — D509 Iron deficiency anemia, unspecified: Secondary | ICD-10-CM | POA: Diagnosis not present

## 2020-09-12 DIAGNOSIS — N2581 Secondary hyperparathyroidism of renal origin: Secondary | ICD-10-CM | POA: Diagnosis not present

## 2020-09-12 DIAGNOSIS — D631 Anemia in chronic kidney disease: Secondary | ICD-10-CM | POA: Diagnosis not present

## 2020-09-12 DIAGNOSIS — Z992 Dependence on renal dialysis: Secondary | ICD-10-CM | POA: Diagnosis not present

## 2020-09-13 DIAGNOSIS — D631 Anemia in chronic kidney disease: Secondary | ICD-10-CM | POA: Diagnosis not present

## 2020-09-13 DIAGNOSIS — N2581 Secondary hyperparathyroidism of renal origin: Secondary | ICD-10-CM | POA: Diagnosis not present

## 2020-09-13 DIAGNOSIS — Z992 Dependence on renal dialysis: Secondary | ICD-10-CM | POA: Diagnosis not present

## 2020-09-13 DIAGNOSIS — D509 Iron deficiency anemia, unspecified: Secondary | ICD-10-CM | POA: Diagnosis not present

## 2020-09-13 DIAGNOSIS — N186 End stage renal disease: Secondary | ICD-10-CM | POA: Diagnosis not present

## 2020-09-14 ENCOUNTER — Telehealth: Payer: Self-pay | Admitting: Urology

## 2020-09-14 DIAGNOSIS — D631 Anemia in chronic kidney disease: Secondary | ICD-10-CM | POA: Diagnosis not present

## 2020-09-14 DIAGNOSIS — N2581 Secondary hyperparathyroidism of renal origin: Secondary | ICD-10-CM | POA: Diagnosis not present

## 2020-09-14 DIAGNOSIS — C61 Malignant neoplasm of prostate: Secondary | ICD-10-CM

## 2020-09-14 DIAGNOSIS — N186 End stage renal disease: Secondary | ICD-10-CM | POA: Diagnosis not present

## 2020-09-14 DIAGNOSIS — D509 Iron deficiency anemia, unspecified: Secondary | ICD-10-CM | POA: Diagnosis not present

## 2020-09-14 DIAGNOSIS — Z992 Dependence on renal dialysis: Secondary | ICD-10-CM | POA: Diagnosis not present

## 2020-09-14 NOTE — Telephone Encounter (Signed)
PSA lower but remains elevated above baseline.  Recommend scheduling a prostate MRI.  Order was entered and will call with results.

## 2020-09-15 ENCOUNTER — Encounter: Payer: Self-pay | Admitting: *Deleted

## 2020-09-15 DIAGNOSIS — Z992 Dependence on renal dialysis: Secondary | ICD-10-CM | POA: Diagnosis not present

## 2020-09-15 DIAGNOSIS — N186 End stage renal disease: Secondary | ICD-10-CM | POA: Diagnosis not present

## 2020-09-15 DIAGNOSIS — D509 Iron deficiency anemia, unspecified: Secondary | ICD-10-CM | POA: Diagnosis not present

## 2020-09-15 DIAGNOSIS — D631 Anemia in chronic kidney disease: Secondary | ICD-10-CM | POA: Diagnosis not present

## 2020-09-15 DIAGNOSIS — N2581 Secondary hyperparathyroidism of renal origin: Secondary | ICD-10-CM | POA: Diagnosis not present

## 2020-09-15 NOTE — Telephone Encounter (Signed)
Notified patient as instructed, patient pleased. Discussed follow-up appointments, patient agrees  

## 2020-09-16 DIAGNOSIS — Z992 Dependence on renal dialysis: Secondary | ICD-10-CM | POA: Diagnosis not present

## 2020-09-16 DIAGNOSIS — D631 Anemia in chronic kidney disease: Secondary | ICD-10-CM | POA: Diagnosis not present

## 2020-09-16 DIAGNOSIS — N186 End stage renal disease: Secondary | ICD-10-CM | POA: Diagnosis not present

## 2020-09-16 DIAGNOSIS — N2581 Secondary hyperparathyroidism of renal origin: Secondary | ICD-10-CM | POA: Diagnosis not present

## 2020-09-16 DIAGNOSIS — D509 Iron deficiency anemia, unspecified: Secondary | ICD-10-CM | POA: Diagnosis not present

## 2020-09-17 ENCOUNTER — Ambulatory Visit (INDEPENDENT_AMBULATORY_CARE_PROVIDER_SITE_OTHER): Payer: Medicare Other | Admitting: *Deleted

## 2020-09-17 DIAGNOSIS — D631 Anemia in chronic kidney disease: Secondary | ICD-10-CM | POA: Diagnosis not present

## 2020-09-17 DIAGNOSIS — I10 Essential (primary) hypertension: Secondary | ICD-10-CM

## 2020-09-17 DIAGNOSIS — Z992 Dependence on renal dialysis: Secondary | ICD-10-CM | POA: Diagnosis not present

## 2020-09-17 DIAGNOSIS — I429 Cardiomyopathy, unspecified: Secondary | ICD-10-CM

## 2020-09-17 DIAGNOSIS — D509 Iron deficiency anemia, unspecified: Secondary | ICD-10-CM | POA: Diagnosis not present

## 2020-09-17 DIAGNOSIS — N186 End stage renal disease: Secondary | ICD-10-CM | POA: Diagnosis not present

## 2020-09-17 DIAGNOSIS — I5023 Acute on chronic systolic (congestive) heart failure: Secondary | ICD-10-CM

## 2020-09-17 DIAGNOSIS — N2581 Secondary hyperparathyroidism of renal origin: Secondary | ICD-10-CM | POA: Diagnosis not present

## 2020-09-17 NOTE — Chronic Care Management (AMB) (Signed)
Chronic Care Management   CCM RN Visit Note  09/17/2020 Name: [name] MRN: [id number] DOB: [birth year]  Subjective: [name] is a 75 y.o. year old male who is a primary care patient of Tullo, Aris Everts, MD. The care management team was consulted for assistance with disease management and care coordination needs.    Engaged with patient by telephone for follow up visit in response to provider referral for case management and/or care coordination services.   Consent to Services:  The patient was given information about Chronic Care Management services, agreed to services, and gave verbal consent prior to initiation of services.  Please see initial visit note for detailed documentation.   Patient agreed to services and verbal consent obtained.   Assessment: Review of patient past medical history, allergies, medications, health status, including review of consultants reports, laboratory and other test data, was performed as part of comprehensive evaluation and provision of chronic care management services.   SDOH (Social Determinants of Health) assessments and interventions performed:    CCM Care Plan  Allergies  Allergen Reactions  . Irbesartan Other (See Comments)    hyperkalemia  . Cymbalta [Duloxetine Hcl]   . Hydralazine Other (See Comments)    headache  . Imdur [Isosorbide Nitrate] Other (See Comments)    headache  . Atorvastatin Other (See Comments)    Muscle pain  . Bystolic [Nebivolol Hcl]     Extreme fatigue     Outpatient Encounter Medications as of 09/17/2020  Medication Sig Note  . amLODipine (NORVASC) 10 MG tablet TAKE 1 TABLET BY MOUTH ONCE DAILY   . carvedilol (COREG) 25 MG tablet Take 25 mg by mouth 2 (two) times daily with a meal.   . cloNIDine (CATAPRES) 0.3 MG tablet Take 0.3 mg by mouth at bedtime. 09/17/2020: Patient reports restarting medication once a day at bedtime for hypertension  . doxazosin (CARDURA) 8 MG tablet Take 8 mg by mouth daily.    . irbesartan (AVAPRO) 300 MG tablet Take 1 tablet (300 mg total) by mouth daily.   Marland Kitchen torsemide (DEMADEX) 20 MG tablet Take 20 mg by mouth 2 (two) times daily.   Marland Kitchen albuterol (VENTOLIN HFA) 108 (90 Base) MCG/ACT inhaler Inhale 2 puffs into the lungs every 6 (six) hours as needed for wheezing or shortness of breath.   Marland Kitchen aspirin 81 MG tablet Take 1 tablet (81 mg total) by mouth daily.   . calcitRIOL (ROCALTROL) 0.25 MCG capsule Take 0.25 mcg by mouth every Monday, Wednesday, and Friday. 08/15/2020: Patient reports not taking  . clopidogrel (PLAVIX) 75 MG tablet Take 1 tablet (75 mg total) by mouth daily.   . diphenhydrAMINE (BENADRYL) 25 MG tablet Take 25 mg by mouth every 6 (six) hours as needed for allergies.   Marland Kitchen ezetimibe (ZETIA) 10 MG tablet Take 1 tablet (10 mg total) by mouth daily.   . fluticasone (FLONASE) 50 MCG/ACT nasal spray Place 2 sprays into both nostrils daily.   . multivitamin (RENA-VIT) TABS tablet Take 1 tablet by mouth at bedtime.   . nicotine (NICODERM CQ - DOSED IN MG/24 HOURS) 14 mg/24hr patch Uses occassionally 08/15/2020: Reports not taking  . rosuvastatin (CRESTOR) 20 MG tablet Take 1 tablet (20 mg total) by mouth at bedtime.    Facility-Administered Encounter Medications as of 09/17/2020  Medication  . ipratropium-albuterol (DUONEB) 0.5-2.5 (3) MG/3ML nebulizer solution 3 mL    Patient Active Problem List   Diagnosis Date Noted  . Hypotension, iatrogenic 08/20/2020  .  ESRD on peritoneal dialysis ([place]) 08/06/2020  . HFrEF (heart failure with reduced ejection fraction) ([place])   . AF (paroxysmal atrial fibrillation) ([place])   . Anemia of chronic disease   . Chronic HFrEF (heart failure with reduced ejection fraction) ([place]) 05/11/2020  . Fluid overload 05/11/2020  . End stage renal disease ([place]) 02/19/2020  . Aortic arch atherosclerosis ([place]) 01/22/2020  . Hospital discharge follow-up 01/22/2020  . Urinary retention 01/22/2020  . Neurologic deficit as late effect of  ischemic cerebrovascular accident (CVA) 01/15/2020  . Dissection of left subclavian artery ([place])   . Cerebrovascular accident (CVA) ([place]) 01/14/2020  . Constipation 05/13/2019  . Cardiomyopathy ([place]) 04/26/2019  . Coronary artery disease involving native coronary artery of native heart without angina pectoris 04/26/2019  . Chronic pain not due to malignancy 04/12/2019  . Chronic bilateral low back pain with bilateral sciatica 11/27/2018  . Synovial cyst of lumbar spine 11/27/2018  . Uncontrolled hypertension 11/14/2018  . CKD (chronic kidney disease), stage V ([place]) 11/14/2018  . Major depressive disorder with current active episode 07/26/2017  . Anemia of chronic kidney failure, stage 4 (severe) ([place]) 07/26/2017  . CKD (chronic kidney disease), stage IV ([place]) 11/25/2016  . DOE (dyspnea on exertion) 11/25/2016  . Type 2 diabetes mellitus with hyperlipidemia ([place]) 12/02/2014  . S/P UVPP (uvulopalatopharyngoplasty) 08/22/2014  . Presbyacusis 08/22/2014  . Adenocarcinoma, renal cell ([place]) 02/26/2014  . Renal cell carcinoma ([place]) 02/26/2014  . Malignant neoplasm of kidney excluding renal pelvis ([place]) 02/26/2014  . Peripheral arterial occlusive disease ([place]) 02/26/2014  . Elevated alkaline phosphatase measurement 07/30/2013  . Prostate CA ([place]) 07/30/2013  . Prostate cancer ([place]) 07/30/2013  . Elevated serum alkaline phosphatase level 07/30/2013  . Malignant neoplasm of prostate ([place]) 07/30/2013  . Hypertensive renal sclerosis with hypertension 10/18/2012  . Renal sclerosis with hypertension 10/18/2012  . Complication of diabetes mellitus ([place]) 07/16/2012  . PAD (peripheral artery disease) ([place])   . Left hip pain 04/20/2012  . Erectile dysfunction 04/20/2012  . Chronic kidney disease, stage III (moderate) ([place]) 04/20/2012  . Hip pain 04/20/2012  . TIA (transient ischemic attack) 01/15/2012  . Temporary cerebral vascular dysfunction 01/15/2012  . Tobacco abuse counseling 05/06/2011  .  Counseling on substance use and abuse 05/06/2011  . Hyperlipidemia   . History of renal cell carcinoma     Conditions to be addressed/monitored:CHF and HTN  Care Plan : Heart Failure (Adult)  Updates made by Leona Singleton, RN since 09/17/2020 12:00 AM  Problem: Symptom Exacerbation (Heart Failure)   Priority: High  Long-Range Goal: Patient will report no rehospitalization within the next 90 days   Start Date: 08/15/2020  Expected End Date: 02/20/2021  This Visit's Progress: On track  Priority: High  Current Barriers:  Marland Kitchen Knowledge deficit related to basic heart failure pathophysiology and self care management as evidenced by recent hospitalization for heart failure exacerbation.  Patient reporting weighing daily.  Weight this morning 142.5 pounds and has ranged 142-144 pounds.  Denies any lower extremity edema currently and denies any shortness of breath.   . Chronic Kidney Disease/Failure requiring peritoneal dialysis -patient reporting since discharge he continues with nightly peritoneal dialysis on the machine.  Does state he feels the solution being used bloats him up and adds weight to his abdomen.  States he is hoping to speak with nephrologist about possible changing to 4 cycles of peritoneal dialysis versus the 3 cycles at night to help with feelings of shortness of breath/bloating. Case Manager Clinical Goal(s):  .  patient will weigh self daily and record . patient will verbalize understanding of Heart Failure Action Plan and when to call doctor . patient will take all Heart Failure mediations as prescribed . patient will weigh daily and record (notifying MD of 3 lb weight gain over night or 5 lb in a week) Interventions:  . Collaboration with Crecencio Mc, MD regarding development and update of comprehensive plan of care as evidenced by provider attestation and co-signature . Inter-disciplinary care team collaboration (see longitudinal plan of care) . Basic overview and  discussion of pathophysiology of Heart Failure reviewed  . Provided verbal education on low sodium diet . Reviewed Heart Failure Action Plan  . Advised patient to weigh each morning after emptying bladder . Discussed importance of daily weight and advised patient to weigh and record daily and to notify provider for 3 lb weight gain over night or 5 lb in a week . Reviewed role of diuretics in prevention of fluid overload and management of heart failure  Assessed understanding of adherence and barriers to treatment plan, as well as lifestyle changes; develop strategies to address barriers.   Established a mutually-agreed-upon early intervention process to communicate with primary care provider/cardiologist/nephrologist when signs/symptoms worsen.  Verified and discussed continued peritoneal dialysis and encouraged patient to have conversation with Nephrologist concerning questions related to dialysis cycles  Discussed and encouraged daily monitoring of lower extremity edema and notify provider if symptoms arise  Reviewed medications and encouraged medication compliance  Self-awareness of signs/symptoms of worsening disease encouraged Patient Goals/Self-Care Activities:: . Call office if I gain more than 2 pounds in one day or 5 pounds in one week . Keep legs up while sitting and watch for swelling in feet, ankles and legs every day . Weigh myself daily and write in log, taking log to medical appointments for provider review . Continue with home peritoneal dialysis and discuss with nephrologist possibility of increasing to 4 cycles per night Follow Up Plan: The care management team will reach out to the patient again over the next 30 business days.     Care Plan : Hypertension (Adult)  Updates made by Leona Singleton, RN since 09/17/2020 12:00 AM  Problem: Resistant Hypertension (Hypertension)   Priority: Medium  Long-Range Goal: Patient will report no hypertensive episodes of SBP >200    Start Date: 08/15/2020  Expected End Date: 02/20/2021  This Visit's Progress: Not on track  Priority: Medium  Objective:  . Last practice recorded BP readings:  BP Readings from Last 3 Encounters:  08/20/20 108/66  08/09/20 (!) 158/60  07/31/20 (!) 180/70  Current Barriers:  Marland Kitchen Knowledge Deficits related to basic understanding of hypertension pathophysiology and self care management Difficulties managing hypertension; at post hospital follow up patient's blood pressure down to 108/66 and medications were adjusted.  Since stopping Clonidine, patient reporting his blood pressure went back up to 200/80's.  He states he resumed taking Clonidine and now blood pressures are ranging 160-200/80's.  Does report some light headiness or dizziness when standing that resolves in a few seconds.  Fall precautions and preventions reviewed with patient.  Missed his 2 week follow up appointment with primary care; patient encouraged to reschedule as soon as possible. Case Manager Clinical Goal(s):  . patient will verbalize understanding of plan for hypertension management . patient will demonstrate improved adherence to prescribed treatment plan for hypertension as evidenced by taking all medications as prescribed, monitoring and recording blood pressure as directed, adhering to low sodium/DASH diet Interventions:  .  Collaboration with Crecencio Mc, MD regarding development and update of comprehensive plan of care as evidenced by provider attestation and co-signature . Inter-disciplinary care team collaboration (see longitudinal plan of care) . Evaluation of current treatment plan related to hypertension self management and patient's adherence to plan as established by provider. . Provided education to patient re: stroke prevention, s/s of heart attack and stroke, DASH diet, complications of uncontrolled blood pressure . Reviewed medications with patient and discussed importance of compliance . Discussed plans  with patient for ongoing care management follow up and provided patient with direct contact information for care management team . Advised patient, providing education and rationale, to monitor blood pressure daily and record, calling PCP for findings outside established parameters.  . Reviewed scheduled/upcoming provider appointments including:  Cardiology in June  Evaluate social and economic barriers that may affect adherence to treatment plan   Reviewed current blood pressures and encouraged patient to contact provider for elevations SBP>200 after taking medications  Discussed low salt diet and food options  Encouraged to increase activity as tolerated  Fall precautions and preventions reviewed and discussed, encouraged to change positions slowly and to dangle prior to standing  Encouraged to reschedule missed appointment with primary care provider as soon as possible to discuss medications Patient Goals/Self-Care Activities: . Check blood pressure daily . Write blood pressure results in a log and take log to appointments for provider review . Low salt heart healthy renal diet . If blood pressure elevated prior to taking medication, recheck blood pressure about 2 hours after taking medicine and write in log . Take medications as prescribed . Reschedule missed appointment with PCP . Fall precautions and preventions, move/change positions slowly, dangle before standing Follow Up Plan: The care management team will reach out to the patient again over the next 30 business days.       Plan:The care management team will reach out to the patient again over the next 30 business days.  Hubert Azure RN, MSN RN Care Management Coordinator [place] [phone] [email or web address]

## 2020-09-17 NOTE — Patient Instructions (Signed)
Visit Information  PATIENT GOALS: Goals Addressed            This Visit's Progress   . (RNCM) Track and Manage Fluids and Swelling-Heart Failure   On track    Timeframe:  Long-Range Goal Priority:  High Start Date:   08/15/20                          Expected End Date:  02/20/21                     Follow Up Date 10/08/20    . Call office if I gain more than 2 pounds in one day or 5 pounds in one week . Keep legs up while sitting and watch for swelling in feet, ankles and legs every day . Weigh myself daily and write in log, taking log to medical appointments for provider review . Continue with home peritoneal dialysis and discuss with nephrologist possibility of increasing to 4 cycles per night   Why is this important?    It is important to check your weight daily and watch how much salt and liquids you have.   It will help you to manage your heart failure.    Notes:     Marland Kitchen [facility]) Track and Manage My Blood Pressure-Hypertension   On track    Timeframe:  Long-Range Goal Priority:  Medium Start Date: 08/15/20                            Expected End Date:  02/20/21                     Follow Up Date 10/08/20    . Check blood pressure daily . Write blood pressure results in a log and take log to appointments for provider review . Low salt heart healthy renal diet . If blood pressure elevated prior to taking medication, recheck blood pressure about 2 hours after taking medicine and write in log . Take medications as prescribed . Reschedule missed appointment with PCP . Fall precautions and preventions, move/change positions slowly, dangle before standing  Why is this important?    You won't feel high blood pressure, but it can still hurt your blood vessels.   High blood pressure can cause heart or kidney problems. It can also cause a stroke.   Making lifestyle changes like losing a little weight or eating less salt will help.   Checking your blood pressure at home and at  different times of the day can help to control blood pressure.   If the doctor prescribes medicine remember to take it the way the doctor ordered.   Call the office if you cannot afford the medicine or if there are questions about it.     Notes:        Patient verbalizes understanding of instructions provided today and agrees to view in [place].   The care management team will reach out to the patient again over the next 30 business days.   Hubert Azure RN, MSN RN Care Management Coordinator [place] [phone] [email or web address]   Orthostatic Hypotension Blood pressure is a measurement of how strongly, or weakly, your blood is pressing against the walls of your arteries. Orthostatic hypotension is a sudden drop in blood pressure that happens when you quickly change positions, such as when you get up from sitting or lying down.  Arteries are blood vessels that carry blood from your heart throughout your body. When blood pressure is too low, you may not get enough blood to your brain or to the rest of your organs. This can cause weakness, light-headedness, rapid heartbeat, and fainting. This can last for just a few seconds or for up to a few minutes. Orthostatic hypotension is usually not a serious problem. However, if it happens frequently or gets worse, it may be a sign of something more serious. What are the causes? This condition may be caused by:  Sudden changes in posture, such as standing up quickly after you have been sitting or lying down.  Blood loss.  Loss of body fluids (dehydration).  Heart problems.  Hormone (endocrine) problems.  Pregnancy.  Severe infection.  Lack of certain nutrients.  Severe allergic reactions (anaphylaxis).  Certain medicines, such as blood pressure medicine or medicines that make the body lose excess fluids (diuretics). Sometimes, this condition can be caused by not taking medicine as  directed, such as taking too much of a certain medicine. What increases the risk? The following factors may make you more likely to develop this condition:  Age. Risk increases as you get older.  Conditions that affect the heart or the central nervous system.  Taking certain medicines, such as blood pressure medicine or diuretics.  Being pregnant. What are the signs or symptoms? Symptoms of this condition may include:  Weakness.  Light-headedness.  Dizziness.  Blurred vision.  Fatigue.  Rapid heartbeat.  Fainting, in severe cases. How is this diagnosed? This condition is diagnosed based on:  Your medical history.  Your symptoms.  Your blood pressure measurement. Your health care provider will check your blood pressure when you are: ? Lying down. ? Sitting. ? Standing. A blood pressure reading is recorded as two numbers, such as "120 over 80" (or 120/80). The first ("top") number is called the systolic pressure. It is a measure of the pressure in your arteries as your heart beats. The second ("bottom") number is called the diastolic pressure. It is a measure of the pressure in your arteries when your heart relaxes between beats. Blood pressure is measured in a unit called mm Hg. Healthy blood pressure for most adults is 120/80. If your blood pressure is below 90/60, you may be diagnosed with hypotension. Other information or tests that may be used to diagnose orthostatic hypotension include:  Your other vital signs, such as your heart rate and temperature.  Blood tests.  Tilt table test. For this test, you will be safely secured to a table that moves you from a lying position to an upright position. Your heart rhythm and blood pressure will be monitored during the test. How is this treated? This condition may be treated by:  Changing your diet. This may involve eating more salt (sodium) or drinking more water.  Taking medicines to raise your blood  pressure.  Changing the dosage of certain medicines you are taking that might be lowering your blood pressure.  Wearing compression stockings. These stockings help to prevent blood clots and reduce swelling in your legs. In some cases, you may need to go to the hospital for:  Fluid replacement. This means you will receive fluids through an IV.  Blood replacement. This means you will receive donated blood through an IV (transfusion).  Treating an infection or heart problems, if this applies.  Monitoring. You may need to be monitored while medicines that you are taking wear off. Follow these instructions  at home: Eating and drinking  Drink enough fluid to keep your urine pale yellow.  Eat a healthy diet, and follow instructions from your health care provider about eating or drinking restrictions. A healthy diet includes: ? Fresh fruits and vegetables. ? Whole grains. ? Lean meats. ? Low-fat dairy products.  Eat extra salt only as directed. Do not add extra salt to your diet unless your health care provider told you to do that.  Eat frequent, small meals.  Avoid standing up suddenly after eating.   Medicines  Take over-the-counter and prescription medicines only as told by your health care provider. ? Follow instructions from your health care provider about changing the dosage of your current medicines, if this applies. ? Do not stop or adjust any of your medicines on your own. General instructions  Wear compression stockings as told by your health care provider.  Get up slowly from lying down or sitting positions. This gives your blood pressure a chance to adjust.  Avoid hot showers and excessive heat as directed by your health care provider.  Return to your normal activities as told by your health care provider. Ask your health care provider what activities are safe for you.  Do not use any products that contain nicotine or tobacco, such as cigarettes, e-cigarettes, and  chewing tobacco. If you need help quitting, ask your health care provider.  Keep all follow-up visits as told by your health care provider. This is important.   Contact a health care provider if you:  Vomit.  Have diarrhea.  Have a fever for more than 2-3 days.  Feel more thirsty than usual.  Feel weak and tired. Get help right away if you:  Have chest pain.  Have a fast or irregular heartbeat.  Develop numbness in any part of your body.  Cannot move your arms or your legs.  Have trouble speaking.  Become sweaty or feel light-headed.  Faint.  Feel short of breath.  Have trouble staying awake.  Feel confused. Summary  Orthostatic hypotension is a sudden drop in blood pressure that happens when you quickly change positions.  Orthostatic hypotension is usually not a serious problem.  It is diagnosed by having your blood pressure taken lying down, sitting, and then standing.  It may be treated by changing your diet or adjusting your medicines. This information is not intended to replace advice given to you by your health care provider. Make sure you discuss any questions you have with your health care provider. Document Revised: 11/03/2017 Document Reviewed: 11/03/2017 Elsevier Patient Education  [place].

## 2020-09-18 DIAGNOSIS — D509 Iron deficiency anemia, unspecified: Secondary | ICD-10-CM | POA: Diagnosis not present

## 2020-09-18 DIAGNOSIS — Z992 Dependence on renal dialysis: Secondary | ICD-10-CM | POA: Diagnosis not present

## 2020-09-18 DIAGNOSIS — N2581 Secondary hyperparathyroidism of renal origin: Secondary | ICD-10-CM | POA: Diagnosis not present

## 2020-09-18 DIAGNOSIS — D631 Anemia in chronic kidney disease: Secondary | ICD-10-CM | POA: Diagnosis not present

## 2020-09-18 DIAGNOSIS — N186 End stage renal disease: Secondary | ICD-10-CM | POA: Diagnosis not present

## 2020-09-19 DIAGNOSIS — Z992 Dependence on renal dialysis: Secondary | ICD-10-CM | POA: Diagnosis not present

## 2020-09-19 DIAGNOSIS — D509 Iron deficiency anemia, unspecified: Secondary | ICD-10-CM | POA: Diagnosis not present

## 2020-09-19 DIAGNOSIS — N2581 Secondary hyperparathyroidism of renal origin: Secondary | ICD-10-CM | POA: Diagnosis not present

## 2020-09-19 DIAGNOSIS — N186 End stage renal disease: Secondary | ICD-10-CM | POA: Diagnosis not present

## 2020-09-19 DIAGNOSIS — D631 Anemia in chronic kidney disease: Secondary | ICD-10-CM | POA: Diagnosis not present

## 2020-09-20 DIAGNOSIS — D509 Iron deficiency anemia, unspecified: Secondary | ICD-10-CM | POA: Diagnosis not present

## 2020-09-20 DIAGNOSIS — Z992 Dependence on renal dialysis: Secondary | ICD-10-CM | POA: Diagnosis not present

## 2020-09-20 DIAGNOSIS — N2581 Secondary hyperparathyroidism of renal origin: Secondary | ICD-10-CM | POA: Diagnosis not present

## 2020-09-20 DIAGNOSIS — N186 End stage renal disease: Secondary | ICD-10-CM | POA: Diagnosis not present

## 2020-09-20 DIAGNOSIS — D631 Anemia in chronic kidney disease: Secondary | ICD-10-CM | POA: Diagnosis not present

## 2020-09-21 DIAGNOSIS — N186 End stage renal disease: Secondary | ICD-10-CM | POA: Diagnosis not present

## 2020-09-21 DIAGNOSIS — D509 Iron deficiency anemia, unspecified: Secondary | ICD-10-CM | POA: Diagnosis not present

## 2020-09-21 DIAGNOSIS — Z992 Dependence on renal dialysis: Secondary | ICD-10-CM | POA: Diagnosis not present

## 2020-09-21 DIAGNOSIS — N2581 Secondary hyperparathyroidism of renal origin: Secondary | ICD-10-CM | POA: Diagnosis not present

## 2020-09-22 DIAGNOSIS — N186 End stage renal disease: Secondary | ICD-10-CM | POA: Diagnosis not present

## 2020-09-22 DIAGNOSIS — D509 Iron deficiency anemia, unspecified: Secondary | ICD-10-CM | POA: Diagnosis not present

## 2020-09-22 DIAGNOSIS — N2581 Secondary hyperparathyroidism of renal origin: Secondary | ICD-10-CM | POA: Diagnosis not present

## 2020-09-22 DIAGNOSIS — Z992 Dependence on renal dialysis: Secondary | ICD-10-CM | POA: Diagnosis not present

## 2020-09-23 DIAGNOSIS — Z992 Dependence on renal dialysis: Secondary | ICD-10-CM | POA: Diagnosis not present

## 2020-09-23 DIAGNOSIS — N186 End stage renal disease: Secondary | ICD-10-CM | POA: Diagnosis not present

## 2020-09-23 DIAGNOSIS — N2581 Secondary hyperparathyroidism of renal origin: Secondary | ICD-10-CM | POA: Diagnosis not present

## 2020-09-23 DIAGNOSIS — D509 Iron deficiency anemia, unspecified: Secondary | ICD-10-CM | POA: Diagnosis not present

## 2020-09-24 DIAGNOSIS — N186 End stage renal disease: Secondary | ICD-10-CM | POA: Diagnosis not present

## 2020-09-24 DIAGNOSIS — N2581 Secondary hyperparathyroidism of renal origin: Secondary | ICD-10-CM | POA: Diagnosis not present

## 2020-09-24 DIAGNOSIS — Z992 Dependence on renal dialysis: Secondary | ICD-10-CM | POA: Diagnosis not present

## 2020-09-24 DIAGNOSIS — D509 Iron deficiency anemia, unspecified: Secondary | ICD-10-CM | POA: Diagnosis not present

## 2020-09-25 DIAGNOSIS — Z992 Dependence on renal dialysis: Secondary | ICD-10-CM | POA: Diagnosis not present

## 2020-09-25 DIAGNOSIS — D509 Iron deficiency anemia, unspecified: Secondary | ICD-10-CM | POA: Diagnosis not present

## 2020-09-25 DIAGNOSIS — N2581 Secondary hyperparathyroidism of renal origin: Secondary | ICD-10-CM | POA: Diagnosis not present

## 2020-09-25 DIAGNOSIS — N186 End stage renal disease: Secondary | ICD-10-CM | POA: Diagnosis not present

## 2020-09-26 DIAGNOSIS — D509 Iron deficiency anemia, unspecified: Secondary | ICD-10-CM | POA: Diagnosis not present

## 2020-09-26 DIAGNOSIS — N186 End stage renal disease: Secondary | ICD-10-CM | POA: Diagnosis not present

## 2020-09-26 DIAGNOSIS — Z992 Dependence on renal dialysis: Secondary | ICD-10-CM | POA: Diagnosis not present

## 2020-09-26 DIAGNOSIS — N2581 Secondary hyperparathyroidism of renal origin: Secondary | ICD-10-CM | POA: Diagnosis not present

## 2020-09-27 DIAGNOSIS — N186 End stage renal disease: Secondary | ICD-10-CM | POA: Diagnosis not present

## 2020-09-27 DIAGNOSIS — N2581 Secondary hyperparathyroidism of renal origin: Secondary | ICD-10-CM | POA: Diagnosis not present

## 2020-09-27 DIAGNOSIS — D509 Iron deficiency anemia, unspecified: Secondary | ICD-10-CM | POA: Diagnosis not present

## 2020-09-27 DIAGNOSIS — Z992 Dependence on renal dialysis: Secondary | ICD-10-CM | POA: Diagnosis not present

## 2020-09-28 DIAGNOSIS — N186 End stage renal disease: Secondary | ICD-10-CM | POA: Diagnosis not present

## 2020-09-28 DIAGNOSIS — N2581 Secondary hyperparathyroidism of renal origin: Secondary | ICD-10-CM | POA: Diagnosis not present

## 2020-09-28 DIAGNOSIS — D509 Iron deficiency anemia, unspecified: Secondary | ICD-10-CM | POA: Diagnosis not present

## 2020-09-28 DIAGNOSIS — Z992 Dependence on renal dialysis: Secondary | ICD-10-CM | POA: Diagnosis not present

## 2020-09-29 ENCOUNTER — Encounter: Payer: Self-pay | Admitting: *Deleted

## 2020-09-29 DIAGNOSIS — N2581 Secondary hyperparathyroidism of renal origin: Secondary | ICD-10-CM | POA: Diagnosis not present

## 2020-09-29 DIAGNOSIS — Z992 Dependence on renal dialysis: Secondary | ICD-10-CM | POA: Diagnosis not present

## 2020-09-29 DIAGNOSIS — D509 Iron deficiency anemia, unspecified: Secondary | ICD-10-CM | POA: Diagnosis not present

## 2020-09-29 DIAGNOSIS — N186 End stage renal disease: Secondary | ICD-10-CM | POA: Diagnosis not present

## 2020-09-30 DIAGNOSIS — N186 End stage renal disease: Secondary | ICD-10-CM | POA: Diagnosis not present

## 2020-09-30 DIAGNOSIS — N2581 Secondary hyperparathyroidism of renal origin: Secondary | ICD-10-CM | POA: Diagnosis not present

## 2020-09-30 DIAGNOSIS — D509 Iron deficiency anemia, unspecified: Secondary | ICD-10-CM | POA: Diagnosis not present

## 2020-09-30 DIAGNOSIS — Z992 Dependence on renal dialysis: Secondary | ICD-10-CM | POA: Diagnosis not present

## 2020-10-01 DIAGNOSIS — Z992 Dependence on renal dialysis: Secondary | ICD-10-CM | POA: Diagnosis not present

## 2020-10-01 DIAGNOSIS — N2581 Secondary hyperparathyroidism of renal origin: Secondary | ICD-10-CM | POA: Diagnosis not present

## 2020-10-01 DIAGNOSIS — N186 End stage renal disease: Secondary | ICD-10-CM | POA: Diagnosis not present

## 2020-10-01 DIAGNOSIS — D509 Iron deficiency anemia, unspecified: Secondary | ICD-10-CM | POA: Diagnosis not present

## 2020-10-02 DIAGNOSIS — D509 Iron deficiency anemia, unspecified: Secondary | ICD-10-CM | POA: Diagnosis not present

## 2020-10-02 DIAGNOSIS — Z992 Dependence on renal dialysis: Secondary | ICD-10-CM | POA: Diagnosis not present

## 2020-10-02 DIAGNOSIS — N2581 Secondary hyperparathyroidism of renal origin: Secondary | ICD-10-CM | POA: Diagnosis not present

## 2020-10-02 DIAGNOSIS — N186 End stage renal disease: Secondary | ICD-10-CM | POA: Diagnosis not present

## 2020-10-03 DIAGNOSIS — N2581 Secondary hyperparathyroidism of renal origin: Secondary | ICD-10-CM | POA: Diagnosis not present

## 2020-10-03 DIAGNOSIS — D509 Iron deficiency anemia, unspecified: Secondary | ICD-10-CM | POA: Diagnosis not present

## 2020-10-03 DIAGNOSIS — N186 End stage renal disease: Secondary | ICD-10-CM | POA: Diagnosis not present

## 2020-10-03 DIAGNOSIS — Z992 Dependence on renal dialysis: Secondary | ICD-10-CM | POA: Diagnosis not present

## 2020-10-04 DIAGNOSIS — N186 End stage renal disease: Secondary | ICD-10-CM | POA: Diagnosis not present

## 2020-10-04 DIAGNOSIS — D509 Iron deficiency anemia, unspecified: Secondary | ICD-10-CM | POA: Diagnosis not present

## 2020-10-04 DIAGNOSIS — N2581 Secondary hyperparathyroidism of renal origin: Secondary | ICD-10-CM | POA: Diagnosis not present

## 2020-10-04 DIAGNOSIS — Z992 Dependence on renal dialysis: Secondary | ICD-10-CM | POA: Diagnosis not present

## 2020-10-05 DIAGNOSIS — N186 End stage renal disease: Secondary | ICD-10-CM | POA: Diagnosis not present

## 2020-10-05 DIAGNOSIS — D509 Iron deficiency anemia, unspecified: Secondary | ICD-10-CM | POA: Diagnosis not present

## 2020-10-05 DIAGNOSIS — N2581 Secondary hyperparathyroidism of renal origin: Secondary | ICD-10-CM | POA: Diagnosis not present

## 2020-10-05 DIAGNOSIS — Z992 Dependence on renal dialysis: Secondary | ICD-10-CM | POA: Diagnosis not present

## 2020-10-06 DIAGNOSIS — D509 Iron deficiency anemia, unspecified: Secondary | ICD-10-CM | POA: Diagnosis not present

## 2020-10-06 DIAGNOSIS — Z992 Dependence on renal dialysis: Secondary | ICD-10-CM | POA: Diagnosis not present

## 2020-10-06 DIAGNOSIS — N2581 Secondary hyperparathyroidism of renal origin: Secondary | ICD-10-CM | POA: Diagnosis not present

## 2020-10-06 DIAGNOSIS — N186 End stage renal disease: Secondary | ICD-10-CM | POA: Diagnosis not present

## 2020-10-07 ENCOUNTER — Ambulatory Visit
Admission: RE | Admit: 2020-10-07 | Discharge: 2020-10-07 | Disposition: A | Discharge status: Home / Self Care | Payer: Medicare Other | Source: Ambulatory Visit | Attending: Urology | Admitting: Urology

## 2020-10-07 ENCOUNTER — Other Ambulatory Visit: Payer: Self-pay

## 2020-10-07 DIAGNOSIS — D509 Iron deficiency anemia, unspecified: Secondary | ICD-10-CM | POA: Diagnosis not present

## 2020-10-07 DIAGNOSIS — Z992 Dependence on renal dialysis: Secondary | ICD-10-CM | POA: Diagnosis not present

## 2020-10-07 DIAGNOSIS — N19 Unspecified kidney failure: Secondary | ICD-10-CM | POA: Diagnosis not present

## 2020-10-07 DIAGNOSIS — R972 Elevated prostate specific antigen [PSA]: Secondary | ICD-10-CM | POA: Diagnosis not present

## 2020-10-07 DIAGNOSIS — N186 End stage renal disease: Secondary | ICD-10-CM | POA: Diagnosis not present

## 2020-10-07 DIAGNOSIS — N400 Enlarged prostate without lower urinary tract symptoms: Secondary | ICD-10-CM | POA: Diagnosis not present

## 2020-10-07 DIAGNOSIS — C61 Malignant neoplasm of prostate: Secondary | ICD-10-CM | POA: Diagnosis not present

## 2020-10-07 DIAGNOSIS — N2581 Secondary hyperparathyroidism of renal origin: Secondary | ICD-10-CM | POA: Diagnosis not present

## 2020-10-08 ENCOUNTER — Ambulatory Visit (INDEPENDENT_AMBULATORY_CARE_PROVIDER_SITE_OTHER): Payer: Medicare Other | Admitting: *Deleted

## 2020-10-08 DIAGNOSIS — N184 Chronic kidney disease, stage 4 (severe): Secondary | ICD-10-CM

## 2020-10-08 DIAGNOSIS — I5023 Acute on chronic systolic (congestive) heart failure: Secondary | ICD-10-CM

## 2020-10-08 DIAGNOSIS — I10 Essential (primary) hypertension: Secondary | ICD-10-CM

## 2020-10-08 DIAGNOSIS — D509 Iron deficiency anemia, unspecified: Secondary | ICD-10-CM | POA: Diagnosis not present

## 2020-10-08 DIAGNOSIS — Z992 Dependence on renal dialysis: Secondary | ICD-10-CM | POA: Diagnosis not present

## 2020-10-08 DIAGNOSIS — I429 Cardiomyopathy, unspecified: Secondary | ICD-10-CM

## 2020-10-08 DIAGNOSIS — N186 End stage renal disease: Secondary | ICD-10-CM | POA: Diagnosis not present

## 2020-10-08 DIAGNOSIS — N2581 Secondary hyperparathyroidism of renal origin: Secondary | ICD-10-CM | POA: Diagnosis not present

## 2020-10-09 DIAGNOSIS — D509 Iron deficiency anemia, unspecified: Secondary | ICD-10-CM | POA: Diagnosis not present

## 2020-10-09 DIAGNOSIS — N186 End stage renal disease: Secondary | ICD-10-CM | POA: Diagnosis not present

## 2020-10-09 DIAGNOSIS — Z992 Dependence on renal dialysis: Secondary | ICD-10-CM | POA: Diagnosis not present

## 2020-10-09 DIAGNOSIS — N2581 Secondary hyperparathyroidism of renal origin: Secondary | ICD-10-CM | POA: Diagnosis not present

## 2020-10-09 NOTE — Chronic Care Management (AMB) (Signed)
Chronic Care Management   CCM RN Visit Note  10/09/2020 Name: [name] MRN: [id number] DOB: [birth year]  Subjective: [name] is a 75 y.o. year old male who is a primary care patient of Tullo, Aris Everts, MD. The care management team was consulted for assistance with disease management and care coordination needs.    Engaged with patient by telephone for follow up visit in response to provider referral for case management and/or care coordination services.   Consent to Services:  The patient was given information about Chronic Care Management services, agreed to services, and gave verbal consent prior to initiation of services.  Please see initial visit note for detailed documentation.   Patient agreed to services and verbal consent obtained.   Assessment: Review of patient past medical history, allergies, medications, health status, including review of consultants reports, laboratory and other test data, was performed as part of comprehensive evaluation and provision of chronic care management services.   SDOH (Social Determinants of Health) assessments and interventions performed:    CCM Care Plan  Allergies  Allergen Reactions  . Irbesartan Other (See Comments)    hyperkalemia  . Cymbalta [Duloxetine Hcl]   . Hydralazine Other (See Comments)    headache  . Imdur [Isosorbide Nitrate] Other (See Comments)    headache  . Atorvastatin Other (See Comments)    Muscle pain  . Bystolic [Nebivolol Hcl]     Extreme fatigue     Outpatient Encounter Medications as of 10/08/2020  Medication Sig Note  . cloNIDine (CATAPRES) 0.3 MG tablet Take 0.3 mg by mouth at bedtime. 09/17/2020: Patient reports restarting medication once a day at bedtime for hypertension  . ergocalciferol (VITAMIN D2) 1.25 MG (50000 UT) capsule Take by mouth.   . torsemide (DEMADEX) 20 MG tablet Take 20 mg by mouth 2 (two) times daily.   Marland Kitchen albuterol (VENTOLIN HFA) 108 (90 Base) MCG/ACT inhaler Inhale 2  puffs into the lungs every 6 (six) hours as needed for wheezing or shortness of breath.   Marland Kitchen amLODipine (NORVASC) 10 MG tablet TAKE 1 TABLET BY MOUTH ONCE DAILY   . aspirin 81 MG tablet Take 1 tablet (81 mg total) by mouth daily.   . calcitRIOL (ROCALTROL) 0.25 MCG capsule Take 0.25 mcg by mouth every Monday, Wednesday, and Friday. 08/15/2020: Patient reports not taking  . carvedilol (COREG) 25 MG tablet Take 25 mg by mouth 2 (two) times daily with a meal.   . clopidogrel (PLAVIX) 75 MG tablet Take 1 tablet (75 mg total) by mouth daily.   . diphenhydrAMINE (BENADRYL) 25 MG tablet Take 25 mg by mouth every 6 (six) hours as needed for allergies.   Marland Kitchen doxazosin (CARDURA) 8 MG tablet Take 8 mg by mouth daily.   Marland Kitchen ezetimibe (ZETIA) 10 MG tablet Take 1 tablet (10 mg total) by mouth daily.   . fluticasone (FLONASE) 50 MCG/ACT nasal spray Place 2 sprays into both nostrils daily.   . irbesartan (AVAPRO) 300 MG tablet Take 1 tablet (300 mg total) by mouth daily.   . multivitamin (RENA-VIT) TABS tablet Take 1 tablet by mouth at bedtime.   . nicotine (NICODERM CQ - DOSED IN MG/24 HOURS) 14 mg/24hr patch Uses occassionally 08/15/2020: Reports not taking  . rosuvastatin (CRESTOR) 20 MG tablet Take 1 tablet (20 mg total) by mouth at bedtime.    Facility-Administered Encounter Medications as of 10/08/2020  Medication  . ipratropium-albuterol (DUONEB) 0.5-2.5 (3) MG/3ML nebulizer solution 3 mL    Patient Active  Problem List   Diagnosis Date Noted  . Hypotension, iatrogenic 08/20/2020  . ESRD on peritoneal dialysis ([place]) 08/06/2020  . HFrEF (heart failure with reduced ejection fraction) ([place])   . AF (paroxysmal atrial fibrillation) ([place])   . Anemia of chronic disease   . Chronic HFrEF (heart failure with reduced ejection fraction) ([place]) 05/11/2020  . Fluid overload 05/11/2020  . End stage renal disease ([place]) 02/19/2020  . Aortic arch atherosclerosis ([place]) 01/22/2020  . Hospital discharge follow-up 01/22/2020   . Urinary retention 01/22/2020  . Neurologic deficit as late effect of ischemic cerebrovascular accident (CVA) 01/15/2020  . Dissection of left subclavian artery ([place])   . Cerebrovascular accident (CVA) ([place]) 01/14/2020  . Constipation 05/13/2019  . Cardiomyopathy ([place]) 04/26/2019  . Coronary artery disease involving native coronary artery of native heart without angina pectoris 04/26/2019  . Chronic pain not due to malignancy 04/12/2019  . Chronic bilateral low back pain with bilateral sciatica 11/27/2018  . Synovial cyst of lumbar spine 11/27/2018  . Uncontrolled hypertension 11/14/2018  . CKD (chronic kidney disease), stage V ([place]) 11/14/2018  . Major depressive disorder with current active episode 07/26/2017  . Anemia of chronic kidney failure, stage 4 (severe) ([place]) 07/26/2017  . CKD (chronic kidney disease), stage IV ([place]) 11/25/2016  . DOE (dyspnea on exertion) 11/25/2016  . Type 2 diabetes mellitus with hyperlipidemia ([place]) 12/02/2014  . S/P UVPP (uvulopalatopharyngoplasty) 08/22/2014  . Presbyacusis 08/22/2014  . Adenocarcinoma, renal cell ([place]) 02/26/2014  . Renal cell carcinoma ([place]) 02/26/2014  . Malignant neoplasm of kidney excluding renal pelvis ([place]) 02/26/2014  . Peripheral arterial occlusive disease ([place]) 02/26/2014  . Elevated alkaline phosphatase measurement 07/30/2013  . Prostate CA ([place]) 07/30/2013  . Prostate cancer ([place]) 07/30/2013  . Elevated serum alkaline phosphatase level 07/30/2013  . Malignant neoplasm of prostate ([place]) 07/30/2013  . Hypertensive renal sclerosis with hypertension 10/18/2012  . Renal sclerosis with hypertension 10/18/2012  . Complication of diabetes mellitus ([place]) 07/16/2012  . PAD (peripheral artery disease) ([place])   . Left hip pain 04/20/2012  . Erectile dysfunction 04/20/2012  . Chronic kidney disease, stage III (moderate) ([place]) 04/20/2012  . Hip pain 04/20/2012  . TIA (transient ischemic attack) 01/15/2012  . Temporary cerebral  vascular dysfunction 01/15/2012  . Tobacco abuse counseling 05/06/2011  . Counseling on substance use and abuse 05/06/2011  . Hyperlipidemia   . History of renal cell carcinoma     Conditions to be addressed/monitored:CHF and HTN  Care Plan : Heart Failure (Adult)  Updates made by Leona Singleton, RN since 10/09/2020 12:00 AM  Problem: Symptom Exacerbation (Heart Failure)   Priority: High  Long-Range Goal: Patient will report no rehospitalization within the next 90 days   Start Date: 08/15/2020  Expected End Date: 02/20/2021  This Visit's Progress: On track  Recent Progress: On track  Priority: High  Current Barriers:  Marland Kitchen Knowledge deficit related to basic heart failure pathophysiology and self care management as evidenced by recent hospitalization for heart failure exacerbation.  Patient reporting weighing daily.  Weight this morning 139.9 pounds.  Denies any lower extremity edema currently and denies any shortness of breath.  Does report feeling weak and tired, little energy. . Chronic Kidney Disease/Failure requiring peritoneal dialysis -patient reporting since discharge he continues with nightly peritoneal dialysis on the machine.  Does state he feels the solution being used bloats him up and adds weight to his abdomen.  States he is still hoping to speak with nephrologist about possible changing to 4  cycles of peritoneal dialysis versus the 3 cycles at night to help with feelings of shortness of breath/bloating. Case Manager Clinical Goal(s):  . patient will weigh self daily and record . patient will verbalize understanding of Heart Failure Action Plan and when to call doctor . patient will take all Heart Failure mediations as prescribed . patient will weigh daily and record (notifying MD of 3 lb weight gain over night or 5 lb in a week) Interventions:  . Collaboration with Crecencio Mc, MD regarding development and update of comprehensive plan of care as evidenced by provider  attestation and co-signature . Inter-disciplinary care team collaboration (see longitudinal plan of care) . Basic overview and discussion of pathophysiology of Heart Failure reviewed  . Provided verbal education on low sodium diet . Reviewed Heart Failure Action Plan  . Advised patient to weigh each morning after emptying bladder . Discussed importance of daily weight and advised patient to weigh and record daily and to notify provider for 3 lb weight gain over night or 5 lb in a week . Reviewed role of diuretics in prevention of fluid overload and management of heart failure  Assessed understanding of adherence and barriers to treatment plan, as well as lifestyle changes; develop strategies to address barriers.   Established a mutually-agreed-upon early intervention process to communicate with primary care provider/cardiologist/nephrologist when signs/symptoms worsen.  Verified and discussed continued peritoneal dialysis and continued to encourage patient to have conversation with Nephrologist concerning questions related to dialysis cycles  Discussed and encouraged daily monitoring of lower extremity edema and notify provider if symptoms arise  Reviewed medications and encouraged medication compliance  Self-awareness of signs/symptoms of worsening disease encouraged Patient Goals/Self-Care Activities:: . Call office if I gain more than 2 pounds in one day or 5 pounds in one week . Keep legs up while sitting and watch for swelling in feet, ankles and legs every day . Weigh myself daily and write in log, taking log to medical appointments for provider review . Continue with home peritoneal dialysis and discuss with nephrologist possibility of increasing to 4 cycles per night Follow Up Plan: The care management team will reach out to the patient again over the next 30 business days.     Care Plan : Hypertension (Adult)  Updates made by Leona Singleton, RN since 10/09/2020 12:00 AM   Problem: Resistant Hypertension (Hypertension)   Priority: Medium  Long-Range Goal: Patient will report no hypertensive episodes of SBP >200   Start Date: 08/15/2020  Expected End Date: 02/20/2021  This Visit's Progress: Not on track  Recent Progress: Not on track  Priority: Medium  Objective:  . Last practice recorded BP readings:  BP Readings from Last 3 Encounters:  08/20/20 108/66  08/09/20 (!) 158/60  07/31/20 (!) 180/70  Current Barriers:  Marland Kitchen Knowledge Deficits related to basic understanding of hypertension pathophysiology and self care management Difficulties managing hypertension; continues to report hypertensive episodes.  BP this morning 175/80.  Does state he is taking only one dose of Clonidine at night and sometimes if blood pressure really elevated in the mornings, he will take a half tablet (states the Clonidine makes him sleepy).  Has not rescheduled missed 2 week follow up appointment with primary care; patient encouraged to reschedule as soon as possible. Case Manager Clinical Goal(s):  . patient will verbalize understanding of plan for hypertension management . patient will demonstrate improved adherence to prescribed treatment plan for hypertension as evidenced by taking all medications as prescribed, monitoring  and recording blood pressure as directed, adhering to low sodium/DASH diet Interventions:  . Collaboration with Crecencio Mc, MD regarding development and update of comprehensive plan of care as evidenced by provider attestation and co-signature . Inter-disciplinary care team collaboration (see longitudinal plan of care) . Evaluation of current treatment plan related to hypertension self management and patient's adherence to plan as established by provider. . Provided education to patient re: stroke prevention, s/s of heart attack and stroke, DASH diet, complications of uncontrolled blood pressure . Reviewed medications with patient and discussed importance of  compliance . Discussed plans with patient for ongoing care management follow up and provided patient with direct contact information for care management team . Advised patient, providing education and rationale, to monitor blood pressure daily and record, calling PCP for findings outside established parameters.  . Reviewed scheduled/upcoming provider appointments including:  Cardiology in June  Evaluate social and economic barriers that may affect adherence to treatment plan   Reviewed current blood pressures and encouraged patient to contact provider for elevations SBP>200 after taking medications  Discussed low salt diet and food options  Encouraged to increase activity as tolerated  Fall precautions and preventions reviewed and discussed, encouraged to change positions slowly and to dangle prior to standing  Encouraged to reschedule missed appointment with primary care provider as soon as possible to discuss medications Patient Goals/Self-Care Activities: . Check blood pressure daily . Write blood pressure results in a log and take log to appointments for provider review . Low salt heart healthy renal diet . If blood pressure elevated prior to taking medication, recheck blood pressure about 2 hours after taking medicine and write in log . Take medications as prescribed . Reschedule missed appointment with PCP . Fall precautions and preventions, move/change positions slowly, dangle before standing Follow Up Plan: The care management team will reach out to the patient again over the next 30 business days.       Plan:The care management team will reach out to the patient again over the next 30 business days.  Hubert Azure RN, MSN RN Care Management Coordinator [place] [phone] [email or web address]

## 2020-10-09 NOTE — Patient Instructions (Signed)
Visit Information  PATIENT GOALS: Goals Addressed            This Visit's Progress   . (RNCM) Track and Manage Fluids and Swelling-Heart Failure   On track    Timeframe:  Long-Range Goal Priority:  High Start Date:   08/15/20                          Expected End Date:  02/20/21                     Follow Up Date 10/31/20    . Call office if I gain more than 2 pounds in one day or 5 pounds in one week . Keep legs up while sitting and watch for swelling in feet, ankles and legs every day . Weigh myself daily and write in log, taking log to medical appointments for provider review . Continue with home peritoneal dialysis and discuss with nephrologist possibility of increasing to 4 cycles per night   Why is this important?    It is important to check your weight daily and watch how much salt and liquids you have.   It will help you to manage your heart failure.    Notes:     Marland Kitchen [facility]) Track and Manage My Blood Pressure-Hypertension   Not on track    Timeframe:  Long-Range Goal Priority:  Medium Start Date: 08/15/20                            Expected End Date:  02/20/21                     Follow Up Date 10/31/20    . Check blood pressure daily . Write blood pressure results in a log and take log to appointments for provider review . Low salt heart healthy renal diet . If blood pressure elevated prior to taking medication, recheck blood pressure about 2 hours after taking medicine and write in log . Take medications as prescribed . Reschedule missed appointment with PCP . Fall precautions and preventions, move/change positions slowly, dangle before standing  Why is this important?    You won't feel high blood pressure, but it can still hurt your blood vessels.   High blood pressure can cause heart or kidney problems. It can also cause a stroke.   Making lifestyle changes like losing a little weight or eating less salt will help.   Checking your blood pressure at home and  at different times of the day can help to control blood pressure.   If the doctor prescribes medicine remember to take it the way the doctor ordered.   Call the office if you cannot afford the medicine or if there are questions about it.     Notes:        Patient verbalizes understanding of instructions provided today and agrees to view in [place].   The care management team will reach out to the patient again over the next 30 business days.   Hubert Azure RN, MSN RN Care Management Coordinator [place] [phone] [email or web address]

## 2020-10-10 ENCOUNTER — Encounter: Payer: Self-pay | Admitting: Urology

## 2020-10-10 DIAGNOSIS — Z992 Dependence on renal dialysis: Secondary | ICD-10-CM | POA: Diagnosis not present

## 2020-10-10 DIAGNOSIS — N186 End stage renal disease: Secondary | ICD-10-CM | POA: Diagnosis not present

## 2020-10-10 DIAGNOSIS — D509 Iron deficiency anemia, unspecified: Secondary | ICD-10-CM | POA: Diagnosis not present

## 2020-10-10 DIAGNOSIS — N2581 Secondary hyperparathyroidism of renal origin: Secondary | ICD-10-CM | POA: Diagnosis not present

## 2020-10-11 DIAGNOSIS — D509 Iron deficiency anemia, unspecified: Secondary | ICD-10-CM | POA: Diagnosis not present

## 2020-10-11 DIAGNOSIS — Z992 Dependence on renal dialysis: Secondary | ICD-10-CM | POA: Diagnosis not present

## 2020-10-11 DIAGNOSIS — N186 End stage renal disease: Secondary | ICD-10-CM | POA: Diagnosis not present

## 2020-10-11 DIAGNOSIS — N2581 Secondary hyperparathyroidism of renal origin: Secondary | ICD-10-CM | POA: Diagnosis not present

## 2020-10-12 DIAGNOSIS — Z992 Dependence on renal dialysis: Secondary | ICD-10-CM | POA: Diagnosis not present

## 2020-10-12 DIAGNOSIS — D509 Iron deficiency anemia, unspecified: Secondary | ICD-10-CM | POA: Diagnosis not present

## 2020-10-12 DIAGNOSIS — N186 End stage renal disease: Secondary | ICD-10-CM | POA: Diagnosis not present

## 2020-10-12 DIAGNOSIS — N2581 Secondary hyperparathyroidism of renal origin: Secondary | ICD-10-CM | POA: Diagnosis not present

## 2020-10-13 DIAGNOSIS — D509 Iron deficiency anemia, unspecified: Secondary | ICD-10-CM | POA: Diagnosis not present

## 2020-10-13 DIAGNOSIS — N186 End stage renal disease: Secondary | ICD-10-CM | POA: Diagnosis not present

## 2020-10-13 DIAGNOSIS — Z992 Dependence on renal dialysis: Secondary | ICD-10-CM | POA: Diagnosis not present

## 2020-10-13 DIAGNOSIS — N2581 Secondary hyperparathyroidism of renal origin: Secondary | ICD-10-CM | POA: Diagnosis not present

## 2020-10-14 DIAGNOSIS — N186 End stage renal disease: Secondary | ICD-10-CM | POA: Diagnosis not present

## 2020-10-14 DIAGNOSIS — Z992 Dependence on renal dialysis: Secondary | ICD-10-CM | POA: Diagnosis not present

## 2020-10-14 DIAGNOSIS — N2581 Secondary hyperparathyroidism of renal origin: Secondary | ICD-10-CM | POA: Diagnosis not present

## 2020-10-14 DIAGNOSIS — D509 Iron deficiency anemia, unspecified: Secondary | ICD-10-CM | POA: Diagnosis not present

## 2020-10-15 DIAGNOSIS — D509 Iron deficiency anemia, unspecified: Secondary | ICD-10-CM | POA: Diagnosis not present

## 2020-10-15 DIAGNOSIS — N2581 Secondary hyperparathyroidism of renal origin: Secondary | ICD-10-CM | POA: Diagnosis not present

## 2020-10-15 DIAGNOSIS — Z992 Dependence on renal dialysis: Secondary | ICD-10-CM | POA: Diagnosis not present

## 2020-10-15 DIAGNOSIS — N186 End stage renal disease: Secondary | ICD-10-CM | POA: Diagnosis not present

## 2020-10-16 DIAGNOSIS — N2581 Secondary hyperparathyroidism of renal origin: Secondary | ICD-10-CM | POA: Diagnosis not present

## 2020-10-16 DIAGNOSIS — D509 Iron deficiency anemia, unspecified: Secondary | ICD-10-CM | POA: Diagnosis not present

## 2020-10-16 DIAGNOSIS — Z992 Dependence on renal dialysis: Secondary | ICD-10-CM | POA: Diagnosis not present

## 2020-10-16 DIAGNOSIS — N186 End stage renal disease: Secondary | ICD-10-CM | POA: Diagnosis not present

## 2020-10-17 DIAGNOSIS — N2581 Secondary hyperparathyroidism of renal origin: Secondary | ICD-10-CM | POA: Diagnosis not present

## 2020-10-17 DIAGNOSIS — Z992 Dependence on renal dialysis: Secondary | ICD-10-CM | POA: Diagnosis not present

## 2020-10-17 DIAGNOSIS — D509 Iron deficiency anemia, unspecified: Secondary | ICD-10-CM | POA: Diagnosis not present

## 2020-10-17 DIAGNOSIS — N186 End stage renal disease: Secondary | ICD-10-CM | POA: Diagnosis not present

## 2020-10-18 DIAGNOSIS — D509 Iron deficiency anemia, unspecified: Secondary | ICD-10-CM | POA: Diagnosis not present

## 2020-10-18 DIAGNOSIS — N186 End stage renal disease: Secondary | ICD-10-CM | POA: Diagnosis not present

## 2020-10-18 DIAGNOSIS — Z992 Dependence on renal dialysis: Secondary | ICD-10-CM | POA: Diagnosis not present

## 2020-10-18 DIAGNOSIS — N2581 Secondary hyperparathyroidism of renal origin: Secondary | ICD-10-CM | POA: Diagnosis not present

## 2020-10-19 DIAGNOSIS — N2581 Secondary hyperparathyroidism of renal origin: Secondary | ICD-10-CM | POA: Diagnosis not present

## 2020-10-19 DIAGNOSIS — N186 End stage renal disease: Secondary | ICD-10-CM | POA: Diagnosis not present

## 2020-10-19 DIAGNOSIS — Z992 Dependence on renal dialysis: Secondary | ICD-10-CM | POA: Diagnosis not present

## 2020-10-19 DIAGNOSIS — D509 Iron deficiency anemia, unspecified: Secondary | ICD-10-CM | POA: Diagnosis not present

## 2020-10-20 DIAGNOSIS — N2581 Secondary hyperparathyroidism of renal origin: Secondary | ICD-10-CM | POA: Diagnosis not present

## 2020-10-20 DIAGNOSIS — Z992 Dependence on renal dialysis: Secondary | ICD-10-CM | POA: Diagnosis not present

## 2020-10-20 DIAGNOSIS — N186 End stage renal disease: Secondary | ICD-10-CM | POA: Diagnosis not present

## 2020-10-20 DIAGNOSIS — D509 Iron deficiency anemia, unspecified: Secondary | ICD-10-CM | POA: Diagnosis not present

## 2020-10-21 DIAGNOSIS — Z992 Dependence on renal dialysis: Secondary | ICD-10-CM | POA: Diagnosis not present

## 2020-10-21 DIAGNOSIS — D509 Iron deficiency anemia, unspecified: Secondary | ICD-10-CM | POA: Diagnosis not present

## 2020-10-21 DIAGNOSIS — N2581 Secondary hyperparathyroidism of renal origin: Secondary | ICD-10-CM | POA: Diagnosis not present

## 2020-10-21 DIAGNOSIS — N186 End stage renal disease: Secondary | ICD-10-CM | POA: Diagnosis not present

## 2020-10-22 DIAGNOSIS — Z992 Dependence on renal dialysis: Secondary | ICD-10-CM | POA: Diagnosis not present

## 2020-10-22 DIAGNOSIS — D509 Iron deficiency anemia, unspecified: Secondary | ICD-10-CM | POA: Diagnosis not present

## 2020-10-22 DIAGNOSIS — N186 End stage renal disease: Secondary | ICD-10-CM | POA: Diagnosis not present

## 2020-10-22 DIAGNOSIS — N2581 Secondary hyperparathyroidism of renal origin: Secondary | ICD-10-CM | POA: Diagnosis not present

## 2020-10-23 DIAGNOSIS — N2581 Secondary hyperparathyroidism of renal origin: Secondary | ICD-10-CM | POA: Diagnosis not present

## 2020-10-23 DIAGNOSIS — N186 End stage renal disease: Secondary | ICD-10-CM | POA: Diagnosis not present

## 2020-10-23 DIAGNOSIS — Z992 Dependence on renal dialysis: Secondary | ICD-10-CM | POA: Diagnosis not present

## 2020-10-23 DIAGNOSIS — D509 Iron deficiency anemia, unspecified: Secondary | ICD-10-CM | POA: Diagnosis not present

## 2020-10-24 DIAGNOSIS — N2581 Secondary hyperparathyroidism of renal origin: Secondary | ICD-10-CM | POA: Diagnosis not present

## 2020-10-24 DIAGNOSIS — Z992 Dependence on renal dialysis: Secondary | ICD-10-CM | POA: Diagnosis not present

## 2020-10-24 DIAGNOSIS — D509 Iron deficiency anemia, unspecified: Secondary | ICD-10-CM | POA: Diagnosis not present

## 2020-10-24 DIAGNOSIS — N186 End stage renal disease: Secondary | ICD-10-CM | POA: Diagnosis not present

## 2020-10-25 DIAGNOSIS — D509 Iron deficiency anemia, unspecified: Secondary | ICD-10-CM | POA: Diagnosis not present

## 2020-10-25 DIAGNOSIS — N2581 Secondary hyperparathyroidism of renal origin: Secondary | ICD-10-CM | POA: Diagnosis not present

## 2020-10-25 DIAGNOSIS — N186 End stage renal disease: Secondary | ICD-10-CM | POA: Diagnosis not present

## 2020-10-25 DIAGNOSIS — Z992 Dependence on renal dialysis: Secondary | ICD-10-CM | POA: Diagnosis not present

## 2020-10-26 DIAGNOSIS — N186 End stage renal disease: Secondary | ICD-10-CM | POA: Diagnosis not present

## 2020-10-26 DIAGNOSIS — Z992 Dependence on renal dialysis: Secondary | ICD-10-CM | POA: Diagnosis not present

## 2020-10-26 DIAGNOSIS — D509 Iron deficiency anemia, unspecified: Secondary | ICD-10-CM | POA: Diagnosis not present

## 2020-10-26 DIAGNOSIS — N2581 Secondary hyperparathyroidism of renal origin: Secondary | ICD-10-CM | POA: Diagnosis not present

## 2020-10-27 DIAGNOSIS — N186 End stage renal disease: Secondary | ICD-10-CM | POA: Diagnosis not present

## 2020-10-27 DIAGNOSIS — N2581 Secondary hyperparathyroidism of renal origin: Secondary | ICD-10-CM | POA: Diagnosis not present

## 2020-10-27 DIAGNOSIS — D509 Iron deficiency anemia, unspecified: Secondary | ICD-10-CM | POA: Diagnosis not present

## 2020-10-27 DIAGNOSIS — Z992 Dependence on renal dialysis: Secondary | ICD-10-CM | POA: Diagnosis not present

## 2020-10-28 DIAGNOSIS — Z992 Dependence on renal dialysis: Secondary | ICD-10-CM | POA: Diagnosis not present

## 2020-10-28 DIAGNOSIS — N2581 Secondary hyperparathyroidism of renal origin: Secondary | ICD-10-CM | POA: Diagnosis not present

## 2020-10-28 DIAGNOSIS — N186 End stage renal disease: Secondary | ICD-10-CM | POA: Diagnosis not present

## 2020-10-28 DIAGNOSIS — D509 Iron deficiency anemia, unspecified: Secondary | ICD-10-CM | POA: Diagnosis not present

## 2020-10-29 ENCOUNTER — Ambulatory Visit (INDEPENDENT_AMBULATORY_CARE_PROVIDER_SITE_OTHER): Payer: Medicare Other | Admitting: Internal Medicine

## 2020-10-29 ENCOUNTER — Other Ambulatory Visit: Payer: Self-pay

## 2020-10-29 ENCOUNTER — Encounter: Payer: Self-pay | Admitting: Internal Medicine

## 2020-10-29 VITALS — BP 180/90 | HR 73 | Ht 66.0 in | Wt 144.0 lb

## 2020-10-29 DIAGNOSIS — I2510 Atherosclerotic heart disease of native coronary artery without angina pectoris: Secondary | ICD-10-CM | POA: Diagnosis not present

## 2020-10-29 DIAGNOSIS — I10 Essential (primary) hypertension: Secondary | ICD-10-CM | POA: Diagnosis not present

## 2020-10-29 DIAGNOSIS — N2581 Secondary hyperparathyroidism of renal origin: Secondary | ICD-10-CM | POA: Diagnosis not present

## 2020-10-29 DIAGNOSIS — D509 Iron deficiency anemia, unspecified: Secondary | ICD-10-CM | POA: Diagnosis not present

## 2020-10-29 DIAGNOSIS — I700 Atherosclerosis of aorta: Secondary | ICD-10-CM | POA: Diagnosis not present

## 2020-10-29 DIAGNOSIS — I5022 Chronic systolic (congestive) heart failure: Secondary | ICD-10-CM

## 2020-10-29 DIAGNOSIS — N186 End stage renal disease: Secondary | ICD-10-CM

## 2020-10-29 DIAGNOSIS — Z992 Dependence on renal dialysis: Secondary | ICD-10-CM | POA: Diagnosis not present

## 2020-10-29 MED ORDER — BISOPROLOL FUMARATE 5 MG PO TABS: 5.0000 mg | ORAL_TABLET | Freq: Every day | ORAL | 5 refills | Status: DC

## 2020-10-29 NOTE — Progress Notes (Signed)
Follow-up Outpatient Visit Date: 10/29/2020  Primary Care Provider: Crecencio Mc, MD [place] [address]  Chief Complaint: Follow-up hypertension and cardiomyopathy  HPI:  Mr. Cruzan is a 75 y.o. male with history of Cornesha Radziewicz-stage renal disease on peritoneal dialysis, cardiomyopathy with an EF of 40-45%, prior abnormal stress test, hypertension, hyperlipidemia, peripheral arterial disease, sleep apnea, renal cell carcinoma status post left heminephrectomy, tobacco abuse, stroke, and adenocarcinoma of the appendix, who presents for follow-up of fatigue and exertional dyspnea.  At our last visit in February, Mr. Friedt reported stable exertional dyspnea with minimal activity such as getting dressed in the morning.  We discussed performing cardiac catheterization given NYHA class III symptoms but agreed to defer this in favor of working on blood pressure control and further management of his chronic anemia.  He was hospitalized at [facility] in March with worsening shortness of breath and edema secondary to uncontrolled hypertension in the setting of ESRD.  His volume was managed with a combination of dialysis and diuresis.  Blood pressure improved with excess fluid removal  Today, Mr. Enberg reports feeling about the same as at our last visit.  He is able to walk but gets tired easily.  Mostly, it is his hips and thighs that bother him.  He also has some exertional dyspnea.  He recently also experienced more shortness of breath, particularly at night, which he thought was due to dwell fluid associated with his peritoneal dialysis.  He happened to miss 1 dose of carvedilol by accident and actually felt like his breathing was better without the medicine.  He has therefore been holding it for the last few days.  His breathing has improved though he notes that his resting heart rate has increased from the 60s to the 80s.  Blood pressure remains quite high, similar to today's reading in  the office.  However, he notes that when he stands his systolic blood pressure sometimes drops by as much is 50 mmHg.  He has some associated orthostatic lightheadedness but has not passed out or fallen.  Mr. Strine denies chest pain and palpitations.  He is in the process of trying to quit smoking.  --------------------------------------------------------------------------------------------------  Past Medical History:  Diagnosis Date  . Acute on chronic systolic congestive heart failure ([place])   . Adenocarcinoma of appendix [facility]) Jan 2006   right kidney, s/p cryoablation  . Atrial fibrillation with rapid ventricular response ([place]) 05/12/2020  . Cardiomyopathy ([place])    a. 12/2018 Echo: EF 40-45%, global HK. Asc Ao 3.7cm; b. 01/2019 Lexi MV: small, mild, fixed basal and mid antlat defect - scar vs artifact. Small, mild mid and apical inf minimally reversible defect, likely scar w/ peri-infarct ischemia. Coronary and Ao atherosclerosis; c. 12/2019 Echo: EF 40-45%, glob HK, Gr1 DD. Nl RV fxn. Sev dil LA. Mild MR. Mild-mod Ao sclerosis w/o stenosis. Asc Ao 74mm.  . Carotid arterial disease ([place])    a. 01/2020 RICA 1-39%, RCCA/RECA < 94%, LICA 7-65%, LCCA/LECA <50%.  . Claudication ([place])    a. 02/2020 ABI/TBI: R 1.08/0.95, L 1.00/0.70.  Marland Kitchen Complication of anesthesia    had to be woken up slowly as his bp was elevated when did this quickly  . Diabetes mellitus without complication ([place])   . ESRD (Joshuwa Vecchio stage renal disease) ([place])    a. Peritoneal Dialysis pt.  . Hyperlipidemia   . Hypertension   . Migraine    cluster  . Neuromuscular disorder ([place])    left  lower extrem neuropathy  . Obstructive sleep apnea    no OSA since had facial surgery with dr. Kathyrn Sheriff in 1997  . PAD (peripheral artery disease) [facility]) Feb 2009   nonobstructing, renal angiogram ([place])  . Renal cell carcinoma 2004   left kidney heminephrectomy  . Renal insufficiency   . Stroke [facility])    a. 12/2019 MRI: Small acute infarcts  involving the left cerebral hemisphere and several chronic infarcts.  Marland Kitchen TIA (transient ischemic attack)    no residual but left leg and foot still feel heavy  . tobacco abuse    Past Surgical History:  Procedure Laterality Date  . CAPD INSERTION N/A 03/01/2019   Procedure: LAPAROSCOPIC INSERTION CONTINUOUS AMBULATORY PERITONEAL DIALYSIS  (CAPD) CATHETER;  Surgeon: Algernon Huxley, MD;  Location: [facility] ORS;  Service: Vascular;  Laterality: N/A;  . CARDIAC CATHETERIZATION     Dr. [name] did this to assess his renal artery  . cyst removal  12/25/2015   Spine L4 and L5  . heminephrectomy  2004   for renal cell CA  . RENAL CRYOABLATION  Jan 2006   right kidney,  [name]  . sciatica      Current Meds  Medication Sig  . albuterol (VENTOLIN HFA) 108 (90 Base) MCG/ACT inhaler Inhale 2 puffs into the lungs every 6 (six) hours as needed for wheezing or shortness of breath.  Marland Kitchen amLODipine (NORVASC) 10 MG tablet TAKE 1 TABLET BY MOUTH ONCE DAILY  . aspirin 81 MG tablet Take 1 tablet (81 mg total) by mouth daily.  . bisoprolol (ZEBETA) 5 MG tablet Take 1 tablet (5 mg total) by mouth daily.  . calcitRIOL (ROCALTROL) 0.25 MCG capsule Take 0.25 mcg by mouth every Monday, Wednesday, and Friday.  . cloNIDine (CATAPRES - DOSED IN MG/24 HR) 0.1 mg/24hr patch Place 0.1 mg onto the skin once a week.  . cloNIDine (CATAPRES) 0.3 MG tablet Take 0.3 mg by mouth at bedtime.  . clopidogrel (PLAVIX) 75 MG tablet Take 1 tablet (75 mg total) by mouth daily.  . diphenhydrAMINE (BENADRYL) 25 MG tablet Take 25 mg by mouth every 6 (six) hours as needed for allergies.  Marland Kitchen doxazosin (CARDURA) 8 MG tablet Take 8 mg by mouth daily.  . ergocalciferol (VITAMIN D2) 1.25 MG (50000 UT) capsule Take 50,000 Units by mouth once a week.  . ezetimibe (ZETIA) 10 MG tablet Take 1 tablet (10 mg total) by mouth daily.  . fluticasone (FLONASE) 50 MCG/ACT nasal spray Place 2 sprays into both nostrils daily as needed for allergies or rhinitis.  Marland Kitchen  irbesartan (AVAPRO) 300 MG tablet Take 1 tablet (300 mg total) by mouth daily.  . multivitamin (RENA-VIT) TABS tablet Take 1 tablet by mouth at bedtime.  . rosuvastatin (CRESTOR) 20 MG tablet Take 1 tablet (20 mg total) by mouth at bedtime.  . torsemide (DEMADEX) 20 MG tablet Take 20 mg by mouth 2 (two) times daily.  . [DISCONTINUED] carvedilol (COREG) 25 MG tablet Take 25 mg by mouth 2 (two) times daily with a meal.   Current Facility-Administered Medications for the 10/29/20 encounter (Office Visit) with Daemion Mcniel, Harrell Gave, MD  Medication  . ipratropium-albuterol (DUONEB) 0.5-2.5 (3) MG/3ML nebulizer solution 3 mL    Allergies: Irbesartan, Cymbalta [duloxetine hcl], Hydralazine, Imdur [isosorbide nitrate], Atorvastatin, and Bystolic [nebivolol hcl]  Social History   Tobacco Use  . Smoking status: Current Every Day Smoker    Packs/day: 1.00    Years: 50.00    Pack years: 50.00  Types: Cigarettes  . Smokeless tobacco: Never Used  Vaping Use  . Vaping Use: Former  . Devices: tried but did not like  Substance Use Topics  . Alcohol use: Not Currently  . Drug use: No    Family History  Problem Relation Age of Onset  . Hypertension Mother   . Cancer Mother        breast  . Aneurysm Mother   . Coronary artery disease Father   . Hypertension Father   . Stroke Father 75  . Heart disease Father   . Heart attack Father 64  . Aneurysm Maternal Grandmother        brain  . Aneurysm Paternal Grandmother        brain  . Coronary artery disease Paternal Grandfather   . Heart disease Brother        valvular heart disease  . COPD Brother   . Hypertension Brother   . Stroke Paternal Uncle     Review of Systems: A 12-system review of systems was performed and was negative except as noted in the HPI.  --------------------------------------------------------------------------------------------------  Physical Exam: BP (!) 180/90 (BP Location: Left Arm, Patient Position: Sitting,  Cuff Size: Normal)   Pulse 73   Ht 5\' 6"  (1.676 m)   Wt 144 lb (65.3 kg)   SpO2 97%   BMI 23.24 kg/m   General:  NAD. Neck: No JVD or HJR. Lungs: Clear to auscultation bilaterally without wheezes or crackles. Heart: Regular rate and rhythm without murmurs, rubs, or gallops. Abdomen: Soft, nontender, nondistended. Extremities: No lower extremity edema.  EKG: Normal sinus rhythm with left anterior fascicular block, LVH, and lateral ST/T changes most likely representing abnormal repolarization.  QTc also mildly prolonged.  Lab Results  Component Value Date   WBC 7.8 08/09/2020   HGB 10.9 (L) 08/09/2020   HCT 31.6 (L) 08/09/2020   MCV 91.3 08/09/2020   PLT 163 08/09/2020    Lab Results  Component Value Date   NA 138 08/09/2020   K 3.5 08/09/2020   CL 106 08/09/2020   CO2 25 08/09/2020   BUN 38 (H) 08/09/2020   CREATININE 6.44 (H) 08/09/2020   GLUCOSE 153 (H) 08/09/2020   ALT 10 01/14/2020    Lab Results  Component Value Date   CHOL 122 05/12/2020   HDL 25 (L) 05/12/2020   LDLCALC 61 05/12/2020   LDLDIRECT 134.0 05/11/2017   TRIG 180 (H) 05/12/2020   CHOLHDL 4.9 05/12/2020    --------------------------------------------------------------------------------------------------  ASSESSMENT AND PLAN: Chronic HFrEF: Mr. Victorio continues to have significant fatigue concerning for NYHA class III heart failure.  We again discussed pros and cons of proceeding with right and left heart catheterization, which Mr. Simmers wishes to think about before proceeding.  In the meantime, we will stop his carvedilol (he feels more short of breath when taking this) and switch to bisoprolol 5 mg daily.  Continue irbesartan 300 mg daily.  Uncontrolled hypertension: Longstanding and likely multifactorial but driven largely by underlying renal disease.  We will add bisoprolol, as above.  Continue amlodipine, clonidine, and irbesartan.  Coronary artery disease and aortic atherosclerosis: No  angina reported though chronic fatigue is concerning for ischemic heart disease in the setting of moderately reduced LVEF and abnormal (intermediate risk) stress test in 2020.  Mr. Saxer remains hesitant to proceed with catheterization.  Ultimately, I think we will need to move forward with this at some point to exclude ischemic substrate as he has known significant aortic atherosclerosis  seen on prior CTA of the neck.  We will continue with aspirin and statin therapy for now.  Modene Andy-stage renal disease: Continue torsemide and peritoneal dialysis per nephrology.  Follow-up: Return to clinic in 6 weeks.  [name], MD 10/29/2020 1:40 PM

## 2020-10-29 NOTE — Patient Instructions (Signed)
Medication Instructions:  Your physician has recommended you make the following change in your medication:   STOP Carvedilol  START Bisoprolol 5 mg daily. An Rx has been sent to your pharmacy.  *If you need a refill on your cardiac medications before your next appointment, please call your pharmacy*   Lab Work: None ordered If you have labs (blood work) drawn today and your tests are completely normal, you will receive your results only by: Marland Kitchen MyChart Message (if you have MyChart) OR . A paper copy in the mail If you have any lab test that is abnormal or we need to change your treatment, we will call you to review the results.   Testing/Procedures: None ordered   Follow-Up: At [facility], you and your health needs are our priority.  As part of our continuing mission to provide you with exceptional heart care, we have created designated Provider Care Teams.  These Care Teams include your primary Cardiologist (physician) and Advanced Practice Providers (APPs -  Physician Assistants and Nurse Practitioners) who all work together to provide you with the care you need, when you need it.  We recommend signing up for the patient portal called "MyChart".  Sign up information is provided on this After Visit Summary.  MyChart is used to connect with patients for Virtual Visits (Telemedicine).  Patients are able to view lab/test results, encounter notes, upcoming appointments, etc.  Non-urgent messages can be sent to your provider as well.   To learn more about what you can do with MyChart, go to [email or web address].    Your next appointment:   6 week(s)  The format for your next appointment:   In Person  Provider:   You may see [name], MD or one of the following Advanced Practice Providers on your designated Care Team:    Andaya Hodgkins, NP  Christell Faith, PA-C  Marrianne Mood, PA-C  Cadence Keo, [place]  Laurann [place], NP    Other Instructions N/A

## 2020-10-30 DIAGNOSIS — D509 Iron deficiency anemia, unspecified: Secondary | ICD-10-CM | POA: Diagnosis not present

## 2020-10-30 DIAGNOSIS — Z992 Dependence on renal dialysis: Secondary | ICD-10-CM | POA: Diagnosis not present

## 2020-10-30 DIAGNOSIS — N2581 Secondary hyperparathyroidism of renal origin: Secondary | ICD-10-CM | POA: Diagnosis not present

## 2020-10-30 DIAGNOSIS — N186 End stage renal disease: Secondary | ICD-10-CM | POA: Diagnosis not present

## 2020-10-31 ENCOUNTER — Ambulatory Visit (INDEPENDENT_AMBULATORY_CARE_PROVIDER_SITE_OTHER): Payer: Medicare Other | Admitting: *Deleted

## 2020-10-31 DIAGNOSIS — N2581 Secondary hyperparathyroidism of renal origin: Secondary | ICD-10-CM | POA: Diagnosis not present

## 2020-10-31 DIAGNOSIS — D509 Iron deficiency anemia, unspecified: Secondary | ICD-10-CM | POA: Diagnosis not present

## 2020-10-31 DIAGNOSIS — I10 Essential (primary) hypertension: Secondary | ICD-10-CM | POA: Diagnosis not present

## 2020-10-31 DIAGNOSIS — N186 End stage renal disease: Secondary | ICD-10-CM

## 2020-10-31 DIAGNOSIS — I5023 Acute on chronic systolic (congestive) heart failure: Secondary | ICD-10-CM | POA: Diagnosis not present

## 2020-10-31 DIAGNOSIS — Z992 Dependence on renal dialysis: Secondary | ICD-10-CM | POA: Diagnosis not present

## 2020-10-31 DIAGNOSIS — N184 Chronic kidney disease, stage 4 (severe): Secondary | ICD-10-CM | POA: Diagnosis not present

## 2020-10-31 NOTE — Patient Instructions (Signed)
Visit Information  PATIENT GOALS:  Goals Addressed             This Visit's Progress    (RNCM) Track and Manage Fluids and Swelling-Heart Failure   On track    Timeframe:  Long-Range Goal Priority:  Medium Start Date:   08/15/20                          Expected End Date:  05/22/21                     Follow Up Date 12/01/20    Call office if I gain more than 2 pounds in one day or 5 pounds in one week Keep legs up while sitting and watch for swelling in feet, ankles and legs every day Weigh myself daily and write in log, taking log to medical appointments for provider review Continue with home peritoneal dialysis  Take medications as prescribed Continue with smoking cessation   Why is this important?   It is important to check your weight daily and watch how much salt and liquids you have.  It will help you to manage your heart failure.    Notes:       (RNCM) Track and Manage My Blood Pressure-Hypertension   On track    Timeframe:  Long-Range Goal Priority:  Medium Start Date: 08/15/20                            Expected End Date:  02/20/21                     Follow Up Date 12/01/20    Check blood pressure daily Write blood pressure results in a log and take log to appointments for provider review Low salt heart healthy renal diet If blood pressure elevated prior to taking medication, recheck blood pressure about 2 hours after taking medicine and write in log Take medications as prescribed Reschedule missed appointment with PCP Call provider for blood pressures consistently low and increase in dizziness  Why is this important?   You won't feel high blood pressure, but it can still hurt your blood vessels.  High blood pressure can cause heart or kidney problems. It can also cause a stroke.  Making lifestyle changes like losing a little weight or eating less salt will help.  Checking your blood pressure at home and at different times of the day can help to control blood  pressure.  If the doctor prescribes medicine remember to take it the way the doctor ordered.  Call the office if you cannot afford the medicine or if there are questions about it.     Notes:          Patient verbalizes understanding of instructions provided today and agrees to view in [place].   The care management team will reach out to the patient again over the next 45 business days.   Hubert Azure RN, MSN RN Care Management Coordinator [place] [phone] [email or web address]

## 2020-10-31 NOTE — Chronic Care Management (AMB) (Addendum)
Chronic Care Management   CCM RN Visit Note  10/31/2020 Name: [name] MRN: [id number] DOB: Nov 24, 1945  Subjective: [name] is a 75 y.o. year old male who is a primary care patient of Tullo, Aris Everts, MD. The care management team was consulted for assistance with disease management and care coordination needs.    Engaged with patient by telephone for follow up visit in response to provider referral for case management and/or care coordination services.   Consent to Services:  The patient was given information about Chronic Care Management services, agreed to services, and gave verbal consent prior to initiation of services.  Please see initial visit note for detailed documentation.   Patient agreed to services and verbal consent obtained.   Assessment: Review of patient past medical history, allergies, medications, health status, including review of consultants reports, laboratory and other test data, was performed as part of comprehensive evaluation and provision of chronic care management services.   SDOH (Social Determinants of Health) assessments and interventions performed:    CCM Care Plan  Allergies  Allergen Reactions   Irbesartan Other (See Comments)    hyperkalemia   Cymbalta [Duloxetine Hcl]    Hydralazine Other (See Comments)    headache   Imdur [Isosorbide Nitrate] Other (See Comments)    headache   Atorvastatin Other (See Comments)    Muscle pain   Bystolic [Nebivolol Hcl]     Extreme fatigue     Outpatient Encounter Medications as of 10/31/2020  Medication Sig   bisoprolol (ZEBETA) 5 MG tablet Take 1 tablet (5 mg total) by mouth daily.   calcitRIOL (ROCALTROL) 0.25 MCG capsule Take 0.25 mcg by mouth every Monday, Wednesday, and Friday.   cloNIDine (CATAPRES - DOSED IN MG/24 HR) 0.1 mg/24hr patch Place 0.1 mg onto the skin once a week.   cloNIDine (CATAPRES) 0.3 MG tablet Take 0.3 mg by mouth at bedtime.   torsemide (DEMADEX) 20 MG tablet Take 20  mg by mouth 2 (two) times daily.   albuterol (VENTOLIN HFA) 108 (90 Base) MCG/ACT inhaler Inhale 2 puffs into the lungs every 6 (six) hours as needed for wheezing or shortness of breath.   amLODipine (NORVASC) 10 MG tablet TAKE 1 TABLET BY MOUTH ONCE DAILY   aspirin 81 MG tablet Take 1 tablet (81 mg total) by mouth daily.   clopidogrel (PLAVIX) 75 MG tablet Take 1 tablet (75 mg total) by mouth daily.   diphenhydrAMINE (BENADRYL) 25 MG tablet Take 25 mg by mouth every 6 (six) hours as needed for allergies.   doxazosin (CARDURA) 8 MG tablet Take 8 mg by mouth daily.   ergocalciferol (VITAMIN D2) 1.25 MG (50000 UT) capsule Take 50,000 Units by mouth once a week.   ezetimibe (ZETIA) 10 MG tablet Take 1 tablet (10 mg total) by mouth daily.   fluticasone (FLONASE) 50 MCG/ACT nasal spray Place 2 sprays into both nostrils daily as needed for allergies or rhinitis.   irbesartan (AVAPRO) 300 MG tablet Take 1 tablet (300 mg total) by mouth daily.   multivitamin (RENA-VIT) TABS tablet Take 1 tablet by mouth at bedtime.   rosuvastatin (CRESTOR) 20 MG tablet Take 1 tablet (20 mg total) by mouth at bedtime.   Facility-Administered Encounter Medications as of 10/31/2020  Medication   ipratropium-albuterol (DUONEB) 0.5-2.5 (3) MG/3ML nebulizer solution 3 mL    Patient Active Problem List   Diagnosis Date Noted   Hypotension, iatrogenic 08/20/2020   ESRD on peritoneal dialysis ([place]) 08/06/2020  HFrEF (heart failure with reduced ejection fraction) (HCC)    AF (paroxysmal atrial fibrillation) (HCC)    Anemia of chronic disease    Chronic HFrEF (heart failure with reduced ejection fraction) ([place]) 05/11/2020   Fluid overload 05/11/2020   End stage renal disease ([place]) 02/19/2020   Aortic atherosclerosis ([place]) 01/22/2020   Hospital discharge follow-up 01/22/2020   Urinary retention 01/22/2020   Neurologic deficit as late effect of ischemic cerebrovascular accident (CVA) 01/15/2020   Dissection of left  subclavian artery (HCC)    Cerebrovascular accident (CVA) ([place]) 01/14/2020   Constipation 05/13/2019   Cardiomyopathy ([place]) 04/26/2019   Coronary artery disease involving native coronary artery of native heart without angina pectoris 04/26/2019   ESRD (end stage renal disease) ([place]) 04/26/2019   Chronic pain not due to malignancy 04/12/2019   Chronic bilateral low back pain with bilateral sciatica 11/27/2018   Synovial cyst of lumbar spine 11/27/2018   Uncontrolled hypertension 11/14/2018   CKD (chronic kidney disease), stage V ([place]) 11/14/2018   Major depressive disorder with current active episode 07/26/2017   Anemia of chronic kidney failure, stage 4 (severe) ([place]) 07/26/2017   CKD (chronic kidney disease), stage IV ([place]) 11/25/2016   DOE (dyspnea on exertion) 11/25/2016   Type 2 diabetes mellitus with hyperlipidemia ([place]) 12/02/2014   S/P UVPP (uvulopalatopharyngoplasty) 08/22/2014   Presbyacusis 08/22/2014   Adenocarcinoma, renal cell ([place]) 02/26/2014   Renal cell carcinoma ([place]) 02/26/2014   Malignant neoplasm of kidney excluding renal pelvis ([place]) 02/26/2014   Peripheral arterial occlusive disease ([place]) 02/26/2014   Elevated alkaline phosphatase measurement 07/30/2013   Prostate CA ([place]) 07/30/2013   Prostate cancer ([place]) 07/30/2013   Elevated serum alkaline phosphatase level 07/30/2013   Malignant neoplasm of prostate ([place]) 07/30/2013   Hypertensive renal sclerosis with hypertension 10/18/2012   Renal sclerosis with hypertension 89/38/1017   Complication of diabetes mellitus ([place]) 07/16/2012   PAD (peripheral artery disease) ([place])    Left hip pain 04/20/2012   Erectile dysfunction 04/20/2012   Chronic kidney disease, stage III (moderate) ([place]) 04/20/2012   Hip pain 04/20/2012   TIA (transient ischemic attack) 01/15/2012   Temporary cerebral vascular dysfunction 01/15/2012   Tobacco abuse counseling 05/06/2011   Counseling on substance use and abuse 05/06/2011    Hyperlipidemia    History of renal cell carcinoma     Conditions to be addressed/monitored:CHF and HTN  Care Plan : Heart Failure (Adult)  Updates made by Leona Singleton, RN since 10/31/2020 12:00 AM     Problem: Symptom Exacerbation (Heart Failure)   Priority: Medium     Long-Range Goal: Patient will report no rehospitalization within the next 90 days   Start Date: 08/15/2020  Expected End Date: 02/20/2021  This Visit's Progress: On track  Recent Progress: On track  Priority: Medium  Note:   Current Barriers:  Knowledge deficit related to basic heart failure pathophysiology and self care management as evidenced by recent hospitalization for heart failure exacerbation.  Patient reporting weighing daily.  Weight this morning 139 pounds.  Denies any lower extremity edema currently and denies any shortness of breath.  Patient felt like Coreg was causing shortness of breath and since change in this medication, states shortness of breath is resolved.  Continues to report feeling weak and tired, little energy. Chronic Kidney Disease/Failure requiring peritoneal dialysis -patient reporting he was able to speak with his nephrologist and has changed up his peritoneal dialysis and is feeling better with the change. Case Manager Clinical Goal(s):  patient  will weigh self daily and record patient will verbalize understanding of Heart Failure Action Plan and when to call doctor patient will take all Heart Failure mediations as prescribed patient will weigh daily and record (notifying MD of 3 lb weight gain over night or 5 lb in a week) Interventions:  Collaboration with Crecencio Mc, MD regarding development and update of comprehensive plan of care as evidenced by provider attestation and co-signature Inter-disciplinary care team collaboration (see longitudinal plan of care) Basic overview and discussion of pathophysiology of Heart Failure reviewed  Provided verbal education on low sodium  diet Reviewed Heart Failure Action Plan  Advised patient to weigh each morning after emptying bladder and dialysis Discussed importance of daily weight and advised patient to weigh and record daily and to notify provider for 3 lb weight gain over night or 5 lb in a week Reviewed role of diuretics in prevention of fluid overload and management of heart failure Assessed understanding of adherence and barriers to treatment plan, as well as lifestyle changes; develop strategies to address barriers.  Established a mutually-agreed-upon early intervention process to communicate with primary care provider/cardiologist/nephrologist when signs/symptoms worsen. Discussed and encouraged daily monitoring of lower extremity edema and notify provider if symptoms arise Reviewed medications and encouraged medication compliance Self-awareness of signs/symptoms of worsening disease encouraged Patient Goals/Self-Care Activities:: Call office if I gain more than 2 pounds in one day or 5 pounds in one week Keep legs up while sitting and watch for swelling in feet, ankles and legs every day Weigh myself daily and write in log, taking log to medical appointments for provider review Continue with home peritoneal dialysis  Take medications as prescribed Continue with smoking cessation Follow Up Plan: The care management team will reach out to the patient again over the next 45 business days.       Care Plan : Hypertension (Adult)  Updates made by Leona Singleton, RN since 10/31/2020 12:00 AM     Problem: Resistant Hypertension (Hypertension)   Priority: Medium     Long-Range Goal: Patient will report no hypertensive episodes of SBP >200   Start Date: 08/15/2020  Expected End Date: 02/20/2021  This Visit's Progress: On track  Recent Progress: Not on track  Priority: Medium  Note:   Objective:  Last practice recorded BP readings:  BP Readings from Last 3 Encounters:  10/29/20 (!) 180/90  08/20/20 108/66   08/09/20 (!) 158/60  Current Barriers:  Knowledge Deficits related to basic understanding of hypertension pathophysiology and self care management Difficulties managing hypertension; Patient felt Coreg was increasing/causing shortness of breath; after discussion with Cardiology medication was changed to Bisoprolol on 10/29/20.  States since change in medication, shortness of breath is much better/resolved.  Also feels blood pressure may be getting better.  Today's blood pressure was 145/67 with previous ranges of 170-180/70-80's.  Heart rate this morning was 63 (was up to 80's when off Coreg).  Continues to report light headiness and dizziness; encouraged to contact provider if symptoms get worse.  Has not rescheduled missed 2 week follow up appointment with primary care; patient encouraged to reschedule as soon as possible. Case Manager Clinical Goal(s):  patient will verbalize understanding of plan for hypertension management patient will demonstrate improved adherence to prescribed treatment plan for hypertension as evidenced by taking all medications as prescribed, monitoring and recording blood pressure as directed, adhering to low sodium/DASH diet Interventions:  Collaboration with Crecencio Mc, MD regarding development and update of comprehensive plan of  care as evidenced by provider attestation and co-signature Inter-disciplinary care team collaboration (see longitudinal plan of care) Evaluation of current treatment plan related to hypertension self management and patient's adherence to plan as established by provider. Provided education to patient re: stroke prevention, s/s of heart attack and stroke, DASH diet, complications of uncontrolled blood pressure Reviewed medications with patient and discussed importance of compliance Discussed plans with patient for ongoing care management follow up and provided patient with direct contact information for care management team Advised patient,  providing education and rationale, to monitor blood pressure daily and record, calling PCP for findings outside established parameters.  Reviewed scheduled/upcoming provider appointments including:  Cardiology 12/12/20 Evaluate social and economic barriers that may affect adherence to treatment plan  Reviewed current blood pressures and encouraged patient to contact provider for elevations SBP>200 after taking medications; also encouraged to contact provider for low blood pressures sustaining and increase in dizziness Discussed low salt diet and food options Encouraged to increase activity as tolerated Fall precautions and preventions reviewed and discussed, encouraged to change positions slowly and to dangle prior to standing Encouraged to reschedule missed appointment with primary care provider as soon as possible to discuss medications Patient Goals/Self-Care Activities: Check blood pressure daily Write blood pressure results in a log and take log to appointments for provider review Low salt heart healthy renal diet If blood pressure elevated prior to taking medication, recheck blood pressure about 2 hours after taking medicine and write in log Take medications as prescribed Reschedule missed appointment with PCP Call provider for blood pressures consistently low and increase in dizziness Follow Up Plan: The care management team will reach out to the patient again over the next 45 business days.        Plan:The care management team will reach out to the patient again over the next 45 business days.  Hubert Azure RN, MSN RN Care Management Coordinator [place] [phone] [email or web address]    Encounter details: CCM Time Spent       Value Time User   Time spent with patient (minutes)  45 10/31/2020 10:35 AM Leona Singleton, RN   Time spent performing Chart review  5 10/31/2020 10:35 AM Leona Singleton, RN   Total time (minutes)  50  10/31/2020 10:35 AM Shelby Mattocks, Illene Regulus, RN      Moderate to High Complex Decision Making       Value Time User   Moderate to High complex decision making  Yes 10/31/2020 10:35 AM Fallon Haecker, Illene Regulus, RN      CCM Services: This encounter meets complex CCM services and moderate to high decision making.  Prior to outreach and patient consent for Chronic Care Management, I referred this patient for services after reviewing the nominated patient list or from a personal encounter with the patient.  I have personally reviewed this encounter including the documentation in this note and have collaborated with the care management provider regarding care management and care coordination activities to include development and update of the comprehensive care plan. I am certifying that I agree with the content of this note and encounter as supervising physician.

## 2020-11-01 DIAGNOSIS — Z992 Dependence on renal dialysis: Secondary | ICD-10-CM | POA: Diagnosis not present

## 2020-11-01 DIAGNOSIS — N2581 Secondary hyperparathyroidism of renal origin: Secondary | ICD-10-CM | POA: Diagnosis not present

## 2020-11-01 DIAGNOSIS — D509 Iron deficiency anemia, unspecified: Secondary | ICD-10-CM | POA: Diagnosis not present

## 2020-11-01 DIAGNOSIS — N186 End stage renal disease: Secondary | ICD-10-CM | POA: Diagnosis not present

## 2020-11-02 DIAGNOSIS — N2581 Secondary hyperparathyroidism of renal origin: Secondary | ICD-10-CM | POA: Diagnosis not present

## 2020-11-02 DIAGNOSIS — N186 End stage renal disease: Secondary | ICD-10-CM | POA: Diagnosis not present

## 2020-11-02 DIAGNOSIS — D509 Iron deficiency anemia, unspecified: Secondary | ICD-10-CM | POA: Diagnosis not present

## 2020-11-02 DIAGNOSIS — Z992 Dependence on renal dialysis: Secondary | ICD-10-CM | POA: Diagnosis not present

## 2020-11-03 DIAGNOSIS — N2581 Secondary hyperparathyroidism of renal origin: Secondary | ICD-10-CM | POA: Diagnosis not present

## 2020-11-03 DIAGNOSIS — Z992 Dependence on renal dialysis: Secondary | ICD-10-CM | POA: Diagnosis not present

## 2020-11-03 DIAGNOSIS — N186 End stage renal disease: Secondary | ICD-10-CM | POA: Diagnosis not present

## 2020-11-03 DIAGNOSIS — D509 Iron deficiency anemia, unspecified: Secondary | ICD-10-CM | POA: Diagnosis not present

## 2020-11-04 ENCOUNTER — Ambulatory Visit: Payer: Medicare Other | Admitting: Nurse Practitioner

## 2020-11-04 DIAGNOSIS — Z992 Dependence on renal dialysis: Secondary | ICD-10-CM | POA: Diagnosis not present

## 2020-11-04 DIAGNOSIS — N2581 Secondary hyperparathyroidism of renal origin: Secondary | ICD-10-CM | POA: Diagnosis not present

## 2020-11-04 DIAGNOSIS — D509 Iron deficiency anemia, unspecified: Secondary | ICD-10-CM | POA: Diagnosis not present

## 2020-11-04 DIAGNOSIS — N186 End stage renal disease: Secondary | ICD-10-CM | POA: Diagnosis not present

## 2020-11-05 DIAGNOSIS — N186 End stage renal disease: Secondary | ICD-10-CM | POA: Diagnosis not present

## 2020-11-05 DIAGNOSIS — D509 Iron deficiency anemia, unspecified: Secondary | ICD-10-CM | POA: Diagnosis not present

## 2020-11-05 DIAGNOSIS — Z992 Dependence on renal dialysis: Secondary | ICD-10-CM | POA: Diagnosis not present

## 2020-11-05 DIAGNOSIS — N2581 Secondary hyperparathyroidism of renal origin: Secondary | ICD-10-CM | POA: Diagnosis not present

## 2020-11-06 ENCOUNTER — Inpatient Hospital Stay: Payer: Medicare Other

## 2020-11-06 ENCOUNTER — Encounter: Payer: Self-pay | Admitting: Internal Medicine

## 2020-11-06 ENCOUNTER — Emergency Department: Payer: Medicare Other

## 2020-11-06 ENCOUNTER — Other Ambulatory Visit: Payer: Self-pay

## 2020-11-06 ENCOUNTER — Observation Stay
Admission: EM | Admit: 2020-11-06 | Discharge: 2020-11-07 | Disposition: A | Discharge status: Home / Self Care | Payer: Medicare Other | Attending: Internal Medicine | Admitting: Internal Medicine

## 2020-11-06 DIAGNOSIS — A419 Sepsis, unspecified organism: Secondary | ICD-10-CM | POA: Diagnosis not present

## 2020-11-06 DIAGNOSIS — Z79899 Other long term (current) drug therapy: Secondary | ICD-10-CM | POA: Insufficient documentation

## 2020-11-06 DIAGNOSIS — K668 Other specified disorders of peritoneum: Secondary | ICD-10-CM | POA: Diagnosis not present

## 2020-11-06 DIAGNOSIS — D631 Anemia in chronic kidney disease: Secondary | ICD-10-CM | POA: Diagnosis not present

## 2020-11-06 DIAGNOSIS — Z20822 Contact with and (suspected) exposure to covid-19: Secondary | ICD-10-CM | POA: Insufficient documentation

## 2020-11-06 DIAGNOSIS — N186 End stage renal disease: Secondary | ICD-10-CM | POA: Insufficient documentation

## 2020-11-06 DIAGNOSIS — D649 Anemia, unspecified: Secondary | ICD-10-CM | POA: Diagnosis present

## 2020-11-06 DIAGNOSIS — K922 Gastrointestinal hemorrhage, unspecified: Secondary | ICD-10-CM | POA: Diagnosis not present

## 2020-11-06 DIAGNOSIS — Z7982 Long term (current) use of aspirin: Secondary | ICD-10-CM | POA: Insufficient documentation

## 2020-11-06 DIAGNOSIS — I2510 Atherosclerotic heart disease of native coronary artery without angina pectoris: Secondary | ICD-10-CM | POA: Diagnosis present

## 2020-11-06 DIAGNOSIS — D72829 Elevated white blood cell count, unspecified: Secondary | ICD-10-CM | POA: Diagnosis not present

## 2020-11-06 DIAGNOSIS — E785 Hyperlipidemia, unspecified: Secondary | ICD-10-CM | POA: Diagnosis present

## 2020-11-06 DIAGNOSIS — I10 Essential (primary) hypertension: Secondary | ICD-10-CM | POA: Diagnosis present

## 2020-11-06 DIAGNOSIS — I480 Paroxysmal atrial fibrillation: Secondary | ICD-10-CM | POA: Diagnosis not present

## 2020-11-06 DIAGNOSIS — Z7901 Long term (current) use of anticoagulants: Secondary | ICD-10-CM | POA: Diagnosis not present

## 2020-11-06 DIAGNOSIS — I5022 Chronic systolic (congestive) heart failure: Secondary | ICD-10-CM | POA: Diagnosis not present

## 2020-11-06 DIAGNOSIS — R198 Other specified symptoms and signs involving the digestive system and abdomen: Secondary | ICD-10-CM

## 2020-11-06 DIAGNOSIS — I120 Hypertensive chronic kidney disease with stage 5 chronic kidney disease or end stage renal disease: Secondary | ICD-10-CM | POA: Insufficient documentation

## 2020-11-06 DIAGNOSIS — Z992 Dependence on renal dialysis: Secondary | ICD-10-CM | POA: Insufficient documentation

## 2020-11-06 DIAGNOSIS — D62 Acute posthemorrhagic anemia: Secondary | ICD-10-CM | POA: Diagnosis not present

## 2020-11-06 DIAGNOSIS — I639 Cerebral infarction, unspecified: Secondary | ICD-10-CM | POA: Diagnosis not present

## 2020-11-06 DIAGNOSIS — R059 Cough, unspecified: Secondary | ICD-10-CM

## 2020-11-06 DIAGNOSIS — K631 Perforation of intestine (nontraumatic): Secondary | ICD-10-CM | POA: Diagnosis not present

## 2020-11-06 DIAGNOSIS — F17210 Nicotine dependence, cigarettes, uncomplicated: Secondary | ICD-10-CM | POA: Insufficient documentation

## 2020-11-06 DIAGNOSIS — I739 Peripheral vascular disease, unspecified: Secondary | ICD-10-CM

## 2020-11-06 DIAGNOSIS — K921 Melena: Secondary | ICD-10-CM | POA: Diagnosis not present

## 2020-11-06 DIAGNOSIS — K625 Hemorrhage of anus and rectum: Secondary | ICD-10-CM | POA: Diagnosis present

## 2020-11-06 DIAGNOSIS — N2581 Secondary hyperparathyroidism of renal origin: Secondary | ICD-10-CM | POA: Diagnosis not present

## 2020-11-06 DIAGNOSIS — E1122 Type 2 diabetes mellitus with diabetic chronic kidney disease: Secondary | ICD-10-CM | POA: Diagnosis not present

## 2020-11-06 DIAGNOSIS — D500 Iron deficiency anemia secondary to blood loss (chronic): Secondary | ICD-10-CM | POA: Diagnosis present

## 2020-11-06 HISTORY — DX: Sepsis, unspecified organism: A41.9

## 2020-11-06 LAB — CBC
HCT: 26.1 % — ABNORMAL LOW (ref 39.0–52.0)
HCT: 25.1 % — ABNORMAL LOW (ref 39.0–52.0)
HCT: 25.2 % — ABNORMAL LOW (ref 39.0–52.0)
Hemoglobin: 9.3 g/dL — ABNORMAL LOW (ref 13.0–17.0)
Hemoglobin: 8.8 g/dL — ABNORMAL LOW (ref 13.0–17.0)
Hemoglobin: 8.8 g/dL — ABNORMAL LOW (ref 13.0–17.0)
MCH: 30.9 pg (ref 26.0–34.0)
MCH: 31 pg (ref 26.0–34.0)
MCH: 31.2 pg (ref 26.0–34.0)
MCHC: 35.6 g/dL (ref 30.0–36.0)
MCHC: 35.1 g/dL (ref 30.0–36.0)
MCHC: 34.9 g/dL (ref 30.0–36.0)
MCV: 86.7 fL (ref 80.0–100.0)
MCV: 88.4 fL (ref 80.0–100.0)
MCV: 89.4 fL (ref 80.0–100.0)
Platelets: 204 10*3/uL (ref 150–400)
Platelets: 192 10*3/uL (ref 150–400)
Platelets: 191 10*3/uL (ref 150–400)
RBC: 3.01 MIL/uL — ABNORMAL LOW (ref 4.22–5.81)
RBC: 2.84 MIL/uL — ABNORMAL LOW (ref 4.22–5.81)
RBC: 2.82 MIL/uL — ABNORMAL LOW (ref 4.22–5.81)
RDW: 14.3 % (ref 11.5–15.5)
RDW: 14.3 % (ref 11.5–15.5)
RDW: 14.2 % (ref 11.5–15.5)
WBC: 12.9 10*3/uL — ABNORMAL HIGH (ref 4.0–10.5)
WBC: 12.1 10*3/uL — ABNORMAL HIGH (ref 4.0–10.5)
WBC: 13.6 10*3/uL — ABNORMAL HIGH (ref 4.0–10.5)
nRBC: 0 % (ref 0.0–0.2)
nRBC: 0 % (ref 0.0–0.2)
nRBC: 0 % (ref 0.0–0.2)

## 2020-11-06 LAB — URINALYSIS, COMPLETE (UACMP) WITH MICROSCOPIC
Bacteria, UA: NONE SEEN
Bilirubin Urine: NEGATIVE
Glucose, UA: 50 mg/dL — AB
Hgb urine dipstick: NEGATIVE
Ketones, ur: NEGATIVE mg/dL
Leukocytes,Ua: NEGATIVE
Nitrite: NEGATIVE
Protein, ur: 100 mg/dL — AB
Specific Gravity, Urine: 1.009 (ref 1.005–1.030)
WBC, UA: NONE SEEN WBC/hpf (ref 0–5)
pH: 7 (ref 5.0–8.0)

## 2020-11-06 LAB — CBC WITH DIFFERENTIAL/PLATELET
Abs Immature Granulocytes: 0.1 10*3/uL — ABNORMAL HIGH (ref 0.00–0.07)
Basophils Absolute: 0.1 10*3/uL (ref 0.0–0.1)
Basophils Relative: 1 %
Eosinophils Absolute: 1.3 10*3/uL — ABNORMAL HIGH (ref 0.0–0.5)
Eosinophils Relative: 8 %
HCT: 28.2 % — ABNORMAL LOW (ref 39.0–52.0)
Hemoglobin: 9.9 g/dL — ABNORMAL LOW (ref 13.0–17.0)
Immature Granulocytes: 1 %
Lymphocytes Relative: 21 %
Lymphs Abs: 3.2 10*3/uL (ref 0.7–4.0)
MCH: 31 pg (ref 26.0–34.0)
MCHC: 35.1 g/dL (ref 30.0–36.0)
MCV: 88.4 fL (ref 80.0–100.0)
Monocytes Absolute: 0.8 10*3/uL (ref 0.1–1.0)
Monocytes Relative: 6 %
Neutro Abs: 9.5 10*3/uL — ABNORMAL HIGH (ref 1.7–7.7)
Neutrophils Relative %: 63 %
Platelets: 203 10*3/uL (ref 150–400)
RBC: 3.19 MIL/uL — ABNORMAL LOW (ref 4.22–5.81)
RDW: 14.3 % (ref 11.5–15.5)
WBC: 14.9 10*3/uL — ABNORMAL HIGH (ref 4.0–10.5)
nRBC: 0 % (ref 0.0–0.2)

## 2020-11-06 LAB — COMPREHENSIVE METABOLIC PANEL
ALT: 11 U/L (ref 0–44)
AST: 12 U/L — ABNORMAL LOW (ref 15–41)
Albumin: 2.9 g/dL — ABNORMAL LOW (ref 3.5–5.0)
Alkaline Phosphatase: 93 U/L (ref 38–126)
Anion gap: 11 (ref 5–15)
BUN: 61 mg/dL — ABNORMAL HIGH (ref 8–23)
CO2: 23 mmol/L (ref 22–32)
Calcium: 8.6 mg/dL — ABNORMAL LOW (ref 8.9–10.3)
Chloride: 103 mmol/L (ref 98–111)
Creatinine, Ser: 7.51 mg/dL — ABNORMAL HIGH (ref 0.61–1.24)
GFR, Estimated: 7 mL/min — ABNORMAL LOW (ref >60)
Glucose, Bld: 170 mg/dL — ABNORMAL HIGH (ref 70–99)
Potassium: 3.7 mmol/L (ref 3.5–5.1)
Sodium: 137 mmol/L (ref 135–145)
Total Bilirubin: 0.8 mg/dL (ref 0.3–1.2)
Total Protein: 5.8 g/dL — ABNORMAL LOW (ref 6.5–8.1)

## 2020-11-06 LAB — RESP PANEL BY RT-PCR (FLU A&B, COVID) ARPGX2
Influenza A by PCR: NEGATIVE
Influenza B by PCR: NEGATIVE
SARS Coronavirus 2 by RT PCR: NEGATIVE

## 2020-11-06 LAB — LACTIC ACID, PLASMA: Lactic Acid, Venous: 1 mmol/L (ref 0.5–1.9)

## 2020-11-06 LAB — LIPASE, BLOOD: Lipase: 28 U/L (ref 11–51)

## 2020-11-06 LAB — PROTIME-INR
INR: 1.1 (ref 0.8–1.2)
Prothrombin Time: 14.1 seconds (ref 11.4–15.2)

## 2020-11-06 LAB — TYPE AND SCREEN
ABO/RH(D): O POS
Antibody Screen: NEGATIVE

## 2020-11-06 LAB — APTT: aPTT: 33 seconds (ref 24–36)

## 2020-11-06 LAB — PROCALCITONIN: Procalcitonin: 0.12 ng/mL

## 2020-11-06 MED ORDER — TORSEMIDE 20 MG PO TABS
100.0000 mg | ORAL_TABLET | Freq: Every day | ORAL | Status: DC
  Administered 2020-11-07: 100 mg via ORAL
  Filled 2020-11-06: qty 5

## 2020-11-06 MED ORDER — PIPERACILLIN-TAZOBACTAM 3.375 G IVPB: 3.3750 g | Freq: Once | INTRAVENOUS | Status: DC

## 2020-11-06 MED ORDER — IRBESARTAN 150 MG PO TABS
300.0000 mg | ORAL_TABLET | Freq: Every day | ORAL | Status: DC
  Administered 2020-11-07: 300 mg via ORAL
  Filled 2020-11-06: qty 2

## 2020-11-06 MED ORDER — DM-GUAIFENESIN ER 30-600 MG PO TB12: 1.0000 | ORAL_TABLET | Freq: Two times a day (BID) | ORAL | Status: DC | PRN

## 2020-11-06 MED ORDER — DIPHENHYDRAMINE HCL 25 MG PO CAPS: 25.0000 mg | ORAL_CAPSULE | Freq: Four times a day (QID) | ORAL | Status: DC | PRN

## 2020-11-06 MED ORDER — PIPERACILLIN-TAZOBACTAM IN DEX 2-0.25 GM/50ML IV SOLN
2.2500 g | Freq: Three times a day (TID) | INTRAVENOUS | Status: DC
  Administered 2020-11-06 – 2020-11-07 (×2): 2.25 g via INTRAVENOUS
  Filled 2020-11-06 (×6): qty 50

## 2020-11-06 MED ORDER — PIPERACILLIN-TAZOBACTAM 3.375 G IVPB 30 MIN
3.3750 g | Freq: Once | INTRAVENOUS | Status: AC
  Administered 2020-11-06: 3.375 g via INTRAVENOUS
  Filled 2020-11-06: qty 50

## 2020-11-06 MED ORDER — ACETAMINOPHEN 325 MG PO TABS: 650.0000 mg | ORAL_TABLET | Freq: Four times a day (QID) | ORAL | Status: DC | PRN

## 2020-11-06 MED ORDER — AMLODIPINE BESYLATE 10 MG PO TABS
10.0000 mg | ORAL_TABLET | Freq: Every day | ORAL | Status: DC
  Administered 2020-11-07: 10 mg via ORAL
  Filled 2020-11-06: qty 1

## 2020-11-06 MED ORDER — FLUTICASONE PROPIONATE 50 MCG/ACT NA SUSP
2.0000 | Freq: Every day | NASAL | Status: DC | PRN
  Filled 2020-11-06: qty 16

## 2020-11-06 MED ORDER — ENALAPRILAT 1.25 MG/ML IV SOLN
0.6250 mg | INTRAVENOUS | Status: DC | PRN
  Administered 2020-11-06: 0.625 mg via INTRAVENOUS
  Filled 2020-11-06: qty 2
  Filled 2020-11-06: qty 0.5

## 2020-11-06 MED ORDER — RENA-VITE PO TABS
1.0000 | ORAL_TABLET | Freq: Every day | ORAL | Status: DC
  Filled 2020-11-06 (×2): qty 1

## 2020-11-06 MED ORDER — SEVELAMER CARBONATE 800 MG PO TABS: 800.0000 mg | ORAL_TABLET | Freq: Three times a day (TID) | ORAL | Status: DC

## 2020-11-06 MED ORDER — NICOTINE 21 MG/24HR TD PT24
21.0000 mg | MEDICATED_PATCH | Freq: Every day | TRANSDERMAL | Status: DC
  Administered 2020-11-06 – 2020-11-07 (×2): 21 mg via TRANSDERMAL
  Filled 2020-11-06 (×2): qty 1

## 2020-11-06 MED ORDER — BISOPROLOL FUMARATE 5 MG PO TABS
5.0000 mg | ORAL_TABLET | Freq: Every day | ORAL | Status: DC
  Administered 2020-11-07: 5 mg via ORAL
  Filled 2020-11-06 (×2): qty 1

## 2020-11-06 MED ORDER — CLONIDINE HCL 0.1 MG PO TABS
0.2000 mg | ORAL_TABLET | Freq: Every day | ORAL | Status: DC
  Administered 2020-11-06: 0.2 mg via ORAL
  Filled 2020-11-06: qty 2

## 2020-11-06 MED ORDER — ROSUVASTATIN CALCIUM 10 MG PO TABS
20.0000 mg | ORAL_TABLET | Freq: Every day | ORAL | Status: DC
  Administered 2020-11-06: 20 mg via ORAL
  Filled 2020-11-06: qty 2

## 2020-11-06 MED ORDER — CALCITRIOL 0.25 MCG PO CAPS
0.2500 ug | ORAL_CAPSULE | ORAL | Status: DC
  Administered 2020-11-07: 0.25 ug via ORAL
  Filled 2020-11-06: qty 1

## 2020-11-06 MED ORDER — GENTAMICIN SULFATE 0.1 % EX CREA
1.0000 "application " | TOPICAL_CREAM | Freq: Every day | CUTANEOUS | Status: DC
  Filled 2020-11-06: qty 15

## 2020-11-06 MED ORDER — ASCORBIC ACID 500 MG PO TABS
500.0000 mg | ORAL_TABLET | Freq: Every day | ORAL | Status: DC
  Administered 2020-11-07: 500 mg via ORAL
  Filled 2020-11-06: qty 1

## 2020-11-06 MED ORDER — MORPHINE SULFATE (PF) 2 MG/ML IV SOLN: 1.0000 mg | INTRAVENOUS | Status: DC | PRN

## 2020-11-06 MED ORDER — PANTOPRAZOLE SODIUM 40 MG IV SOLR
40.0000 mg | Freq: Two times a day (BID) | INTRAVENOUS | Status: DC
  Administered 2020-11-06 – 2020-11-07 (×3): 40 mg via INTRAVENOUS
  Filled 2020-11-06 (×3): qty 40

## 2020-11-06 MED ORDER — ALBUTEROL SULFATE (2.5 MG/3ML) 0.083% IN NEBU: 3.0000 mL | INHALATION_SOLUTION | RESPIRATORY_TRACT | Status: DC | PRN

## 2020-11-06 MED ORDER — TECHNETIUM TC 99M-LABELED RED BLOOD CELLS IV KIT
20.0000 | PACK | Freq: Once | INTRAVENOUS | Status: AC | PRN
  Administered 2020-11-06: 20.95 via INTRAVENOUS

## 2020-11-06 MED ORDER — CLONIDINE HCL 0.1 MG/24HR TD PTWK
0.1000 mg | MEDICATED_PATCH | TRANSDERMAL | Status: DC
  Administered 2020-11-07: 0.1 mg via TRANSDERMAL
  Filled 2020-11-06 (×2): qty 1

## 2020-11-06 MED ORDER — B COMPLEX VITAMINS PO CAPS: 1.0000 | ORAL_CAPSULE | Freq: Every day | ORAL | Status: DC

## 2020-11-06 MED ORDER — DOXAZOSIN MESYLATE 8 MG PO TABS
8.0000 mg | ORAL_TABLET | Freq: Every day | ORAL | Status: DC
  Filled 2020-11-06: qty 1

## 2020-11-06 MED ORDER — HEPARIN 1000 UNIT/ML FOR PERITONEAL DIALYSIS
500.0000 [IU] | INTRAMUSCULAR | Status: DC | PRN
  Filled 2020-11-06: qty 0.5

## 2020-11-06 MED ORDER — IPRATROPIUM-ALBUTEROL 0.5-2.5 (3) MG/3ML IN SOLN: 3.0000 mL | Freq: Four times a day (QID) | RESPIRATORY_TRACT | Status: DC

## 2020-11-06 MED ORDER — SODIUM CHLORIDE 0.9 % IV SOLN: INTRAVENOUS | Status: DC

## 2020-11-06 MED ORDER — ONDANSETRON HCL 4 MG/2ML IJ SOLN: 4.0000 mg | Freq: Three times a day (TID) | INTRAMUSCULAR | Status: DC | PRN

## 2020-11-06 MED ORDER — DELFLEX-LC/1.5% DEXTROSE 344 MOSM/L IP SOLN
INTRAPERITONEAL | Status: DC
  Filled 2020-11-06 (×2): qty 3000

## 2020-11-06 NOTE — ED Triage Notes (Addendum)
Pt to triage via w/c; Pt reports rectal bleeding since yesterday; denies pain; pt appears very weak in triage, slow to answer questions, unable to hold self uprite; pt taken immed to room 12 and care nurse Ally RN called to room

## 2020-11-06 NOTE — ED Notes (Addendum)
Medium sized BM in brief, maroon colored. EDP aware. pericare provided.

## 2020-11-06 NOTE — Consult Note (Signed)
Pharmacy Antibiotic Note  [name] is a 75 y.o. male w/ h/o CAD/PVD (on plavix), stroke, DM with ESRD-PD, HLD, HTN, CMP (EF40-45%) now presenting for evaluation of rectal bleeding admitted on 11/06/2020 with c/f  IAI c/b bowel perforation .  Pharmacy has been consulted for Zosyn dosing.  Plan: Received a first dose of Zosyn 3.375g in the ED (6/16 0944);  Will initiate Zosyn 2.25g q8h ISO ESRD-PD starting at 6/16 1700.  Height: 5\' 6"  (167.6 cm) Weight: 62.6 kg (138 lb) IBW/kg (Calculated) : 63.8  Temp (24hrs), Avg:97.7 F (36.5 C), Min:97.7 F (36.5 C), Max:97.7 F (36.5 C)  Recent Labs  Lab 11/06/20 0651 11/06/20 0810  WBC 14.9*  --   CREATININE 7.51*  --   LATICACIDVEN  --  1.0    Estimated Creatinine Clearance: 7.5 mL/min (A) (by C-G formula based on SCr of 7.51 mg/dL (H)).    Allergies  Allergen Reactions   Irbesartan Other (See Comments)    hyperkalemia   Cymbalta [Duloxetine Hcl]    Hydralazine Other (See Comments)    headache   Imdur [Isosorbide Nitrate] Other (See Comments)    headache   Atorvastatin Other (See Comments)    Muscle pain   Bystolic [Nebivolol Hcl]     Extreme fatigue     Antimicrobials this admission: Zosyn (6/16 >>  Dose adjustments this admission: ESRD-PD dependent  Microbiology results: 6/16 BCx: sent/pending 6/16 Covid/Flu: negative  Thank you for allowing pharmacy to be a part of this patient's care.  Lorna Dibble 11/06/2020 9:48 AM

## 2020-11-06 NOTE — ED Notes (Signed)
Pt via POV from home. Pt c/o rectal bleeding since yesterday afternoon. Pt states that he is on Plavix for a previous stroke. Pt describes the blood as dark. Pt is A&Ox4 and NAD. Pt is pale at this time. Denies pain. Denies NVD. Denies fever.

## 2020-11-06 NOTE — ED Provider Notes (Signed)
Emergency Medicine Provider Triage Evaluation Note  [name] , a 75 y.o. male  was evaluated in triage.  Pt complains of rectal bleeding that started yesterday afternoon.  Patient is on Plavix.  Complains of feeling weak and dizzy.  Has chronic shortness of breath which is unchanged.  No chest pain.  No abdominal pain..  Review of Systems  Positive: Rectal bleeding Negative: Chest pain, abdominal pain, vomiting  Physical Exam  BP (!) 128/53 (BP Location: Right Arm)   Pulse 72   Temp 97.7 F (36.5 C) (Oral)   Resp 18   Ht 5\' 6"  (1.676 m)   Wt 62.6 kg   SpO2 97%   BMI 22.27 kg/m  Gen:   Awake, no distress   Resp:  Normal effort  MSK:   Moves extremities without difficulty  Other:  Pale conjunctive, benign abdominal exam, PD catheter noted to the left lower quadrant  Medical Decision Making  Medically screening exam initiated at 6:55 AM.  Appropriate orders placed.  [name] was informed that the remainder of the evaluation will be completed by another provider, this initial triage assessment does not replace that evaluation, and the importance of remaining in the ED until their evaluation is complete.  Patient here with rectal bleeding.  Does have a large amount of maroon-colored stool on rectal exam.  Pale conjunctive a but hemodynamically stable.  2 large-bore peripheral IVs placed, patient placed on cardiac monitoring and labs sent immediately upon arrival to the ED.  Abdominal exam is benign.  Patient will need admission.  He reports he has had an endoscopy, colonoscopy years ago.  No previous history of GI bleed.   [name], Brett Bison, DO 11/06/20 [phone]

## 2020-11-06 NOTE — H&P (Signed)
History and Physical    [name] QMG:[id number] DOB: [birth year] DOA: 11/06/2020  Referring MD/NP/PA:   PCP: Crecencio Mc, MD   Patient coming from:  The patient is coming from home.  At baseline, pt is independent for most of ADL.        Chief Complaint: rectal bleeding  HPI: [name] is a 75 y.o. male with medical history significant of ESRD on peritoneal dialysis, hypertension, hyperlipidemia, diabetes mellitus, stroke, TIA, depression, tobacco abuse, OSA, migraine headache, carotid artery stenosis, PVD, atrial fibrillation not on anticoagulants, sCHF with EF 40-45%, renal cell carcinoma (s/p of left nephrectomy 2004), prostate cancer, adenocarcinoma of appendix, anemia, who presents with rectal bleeding.  Patient states that he started having rectal bleeding since yesterday.  He has had at least 12 episodes of rectal bleeding.  Patient also has dizziness, lightheadedness and generalized weakness.  Denies nausea, vomiting, abdominal pain.  No fever or chills.  Patient states that he has chronic mild cough and mild shortness breath which has not changed.  No chest pain.  No symptoms of UTI.   ED Course: pt was found to have hemoglobin 10.9 on 08/09/2020 -->9.9 -->9.3.  WBC 14.9, negative COVID PCR, potassium 3.7, bicarbonate of 23, creatinine 7.51, BUN 61, temperature normal, blood pressure 183/66, heart rate 63, RR 23, oxygen saturation 99% on room air. Nuclear medicine bleeding scan is negative. Patient is admitted to progressive bed as inpatient.  Dr. Holley Raring of renal, general surgeon, Dr. Lucila Maine and Dr. Marius Ditch of GI are consulted.   CT-abd/pelvis without contrast: Large amount of free intraperitoneal air, much more than usually seen in the setting of peritoneal dialysis. Suspicion of bowel perforation. No definable gastric or small bowel pathology, therefore perforated diverticulum is felt most likely.   Multiple low-density lesions of the kidneys, along with  some hyperdense lesions. Findings probably relate to renal cysts of varying density, enlarged since the comparison exam. However, mass lesion is not excluded and in this patient with a history of previous renal cell carcinoma on the left, MRI of the kidney should be considered electively to further evaluate.   Aortic atherosclerosis.   Review of Systems:   General: no fevers, chills, no body weight gain, as fatigue HEENT: no blurry vision, hearing changes or sore throat Respiratory: has mild dyspnea, coughing, no wheezing CV: no chest pain, no palpitations GI: no nausea, vomiting, abdominal pain, diarrhea, constipation. S/p of peritoneal dialysis catheter in left lower quadrant GU: no dysuria, burning on urination, increased urinary frequency, hematuria  Ext: no leg edema Neuro: no unilateral weakness, numbness, or tingling, no vision change or hearing loss Skin: no rash, no skin tear. MSK: No muscle spasm, no deformity, no limitation of range of movement in spin Heme: No easy bruising.  Travel history: No recent long distant travel.  Allergy:  Allergies  Allergen Reactions   Irbesartan Other (See Comments)    hyperkalemia   Cymbalta [Duloxetine Hcl]    Hydralazine Other (See Comments)    headache   Imdur [Isosorbide Nitrate] Other (See Comments)    headache   Atorvastatin Other (See Comments)    Muscle pain   Bystolic [Nebivolol Hcl]     Extreme fatigue     Past Medical History:  Diagnosis Date   Acute on chronic systolic congestive heart failure (HCC)    Adenocarcinoma of appendix [facility]) Jan 2006   right kidney, s/p cryoablation   Atrial fibrillation with rapid ventricular response ([place]) 05/12/2020   Cardiomyopathy (  [place])    a. 12/2018 Echo: EF 40-45%, global HK. Asc Ao 3.7cm; b. 01/2019 [place] MV: small, mild, fixed basal and mid antlat defect - scar vs artifact. Small, mild mid and apical inf minimally reversible defect, likely scar w/ peri-infarct ischemia. Coronary and  Ao atherosclerosis; c. 12/2019 Echo: EF 40-45%, glob HK, Gr1 DD. Nl RV fxn. Sev dil LA. Mild MR. Mild-mod Ao sclerosis w/o stenosis. Asc Ao 41mm.   Carotid arterial disease ([place])    a. 01/2020 RICA 1-39%, RCCA/RECA < 61%, LICA 4-43%, LCCA/LECA <50%.   Claudication ([place])    a. 02/2020 ABI/TBI: R 1.08/0.95, L 1.00/0.70.   Complication of anesthesia    had to be woken up slowly as his bp was elevated when did this quickly   Diabetes mellitus without complication (HCC)    ESRD (end stage renal disease) ([place])    a. Peritoneal Dialysis pt.   Hyperlipidemia    Hypertension    Migraine    cluster   Neuromuscular disorder ([place])    left lower extrem neuropathy   Obstructive sleep apnea    no OSA since had facial surgery with dr. Kathyrn Sheriff in 1997   PAD (peripheral artery disease) [facility]) Feb 2009   nonobstructing, renal angiogram [name]   Renal cell carcinoma 2004   left kidney heminephrectomy   Renal insufficiency    Stroke [facility])    a. 12/2019 MRI: Small acute infarcts involving the left cerebral hemisphere and several chronic infarcts.   TIA (transient ischemic attack)    no residual but left leg and foot still feel heavy   tobacco abuse     Past Surgical History:  Procedure Laterality Date   CAPD INSERTION N/A 03/01/2019   Procedure: LAPAROSCOPIC INSERTION CONTINUOUS AMBULATORY PERITONEAL DIALYSIS  (CAPD) CATHETER;  Surgeon: Algernon Huxley, MD;  Location: [facility] ORS;  Service: Vascular;  Laterality: N/A;   CARDIAC CATHETERIZATION     Dr. [name] did this to assess his renal artery   cyst removal  12/25/2015   Spine L4 and L5   heminephrectomy  2004   for renal cell CA   RENAL CRYOABLATION  Jan 2006   right kidney,  [name]   sciatica      Social History:  reports that he has been smoking cigarettes. He has a 50.00 pack-year smoking history. He has never used smokeless tobacco. He reports previous alcohol use. He reports that he does not use drugs.  Family History:  Family History  Problem  Relation Age of Onset   Hypertension Mother    Cancer Mother        breast   Aneurysm Mother    Coronary artery disease Father    Hypertension Father    Stroke Father 46   Heart disease Father    Heart attack Father 63   Aneurysm Maternal Grandmother        brain   Aneurysm Paternal Grandmother        brain   Coronary artery disease Paternal Grandfather    Heart disease Brother        valvular heart disease   COPD Brother    Hypertension Brother    Stroke Paternal Uncle      Prior to Admission medications   Medication Sig Start Date End Date Taking? Authorizing Provider  albuterol (VENTOLIN HFA) 108 (90 Base) MCG/ACT inhaler Inhale 2 puffs into the lungs every 6 (six) hours as needed for wheezing or shortness of breath. 04/12/19   Crecencio Mc, MD  amLODipine (NORVASC) 10 MG tablet TAKE 1 TABLET BY MOUTH ONCE DAILY 06/09/20   Crecencio Mc, MD  aspirin 81 MG tablet Take 1 tablet (81 mg total) by mouth daily. 05/16/14   Crecencio Mc, MD  bisoprolol (ZEBETA) 5 MG tablet Take 1 tablet (5 mg total) by mouth daily. 10/29/20   End, Harrell Gave, MD  calcitRIOL (ROCALTROL) 0.25 MCG capsule Take 0.25 mcg by mouth every Monday, Wednesday, and Friday.    [provider]  cloNIDine (CATAPRES - DOSED IN MG/24 HR) 0.1 mg/24hr patch Place 0.1 mg onto the skin once a week.    [provider]  cloNIDine (CATAPRES) 0.3 MG tablet Take 0.3 mg by mouth at bedtime.    [provider]  clopidogrel (PLAVIX) 75 MG tablet Take 1 tablet (75 mg total) by mouth daily. 04/02/20   Theora Gianotti, NP  diphenhydrAMINE (BENADRYL) 25 MG tablet Take 25 mg by mouth every 6 (six) hours as needed for allergies.    [provider]  doxazosin (CARDURA) 8 MG tablet Take 8 mg by mouth daily. 03/29/20   [provider]  ergocalciferol (VITAMIN D2) 1.25 MG (50000 UT) capsule Take 50,000 Units by mouth once a week. 09/18/20 09/18/21  [provider]  ezetimibe  (ZETIA) 10 MG tablet Take 1 tablet (10 mg total) by mouth daily. 05/09/19 04/02/29  Theora Gianotti, NP  fluticasone (FLONASE) 50 MCG/ACT nasal spray Place 2 sprays into both nostrils daily as needed for allergies or rhinitis.    [provider]  irbesartan (AVAPRO) 300 MG tablet Take 1 tablet (300 mg total) by mouth daily. 08/10/20   Debbe Odea, MD  multivitamin (RENA-VIT) TABS tablet Take 1 tablet by mouth at bedtime. 12/21/19   [provider]  rosuvastatin (CRESTOR) 20 MG tablet Take 1 tablet (20 mg total) by mouth at bedtime. 04/02/20 04/02/21  Theora Gianotti, NP  torsemide (DEMADEX) 20 MG tablet Take 20 mg by mouth 2 (two) times daily. 08/10/20   [provider]    Physical Exam: Vitals:   11/06/20 0636 11/06/20 0730 11/06/20 0745  BP: (!) 128/53 (!) 183/68   Pulse: 72 60 63  Resp: 18 (!) 23 18  Temp: 97.7 F (36.5 C)    TempSrc: Oral    SpO2: 97% 100% 99%  Weight: 62.6 kg    Height: 5\' 6"  (1.676 m)     General: Not in acute distress HEENT:       Eyes: PERRL, EOMI, no scleral icterus.       ENT: No discharge from the ears and nose, no pharynx injection, no tonsillar enlargement.        Neck: No JVD, no bruit, no mass felt. Heme: No neck lymph node enlargement. Cardiac: S1/S2, RRR, No murmurs, No gallops or rubs. Respiratory: No rales, wheezing, rhonchi or rubs. GI: Soft, nondistended, nontender, no rebound pain, no organomegaly, BS present. S/p of peritoneal dialysis catheter in left lower quadrant GU: No hematuria Ext: No pitting leg edema bilaterally. 1+DP/PT pulse bilaterally. Musculoskeletal: No joint deformities, No joint redness or warmth, no limitation of ROM in spin. Skin: No rashes.  Neuro: Alert, oriented X3, cranial nerves II-XII grossly intact, moves all extremities normally.  Psych: Patient is not psychotic, no suicidal or hemocidal ideation.  Labs on Admission: I have personally reviewed following labs and  imaging studies  CBC: Recent Labs  Lab 11/06/20 0651 11/06/20 1039  WBC 14.9* 12.9*  NEUTROABS 9.5*  --   HGB  9.9* 9.3*  HCT 28.2* 26.1*  MCV 88.4 86.7  PLT 203 734   Basic Metabolic Panel: Recent Labs  Lab 11/06/20 0651  NA 137  K 3.7  CL 103  CO2 23  GLUCOSE 170*  BUN 61*  CREATININE 7.51*  CALCIUM 8.6*   GFR: Estimated Creatinine Clearance: 7.5 mL/min (A) (by C-G formula based on SCr of 7.51 mg/dL (H)). Liver Function Tests: Recent Labs  Lab 11/06/20 0651  AST 12*  ALT 11  ALKPHOS 93  BILITOT 0.8  PROT 5.8*  ALBUMIN 2.9*   Recent Labs  Lab 11/06/20 0651  LIPASE 28   No results for input(s): AMMONIA in the last 168 hours. Coagulation Profile: Recent Labs  Lab 11/06/20 0651  INR 1.1   Cardiac Enzymes: No results for input(s): CKTOTAL, CKMB, CKMBINDEX, TROPONINI in the last 168 hours. BNP (last 3 results) No results for input(s): PROBNP in the last 8760 hours. HbA1C: No results for input(s): HGBA1C in the last 72 hours. CBG: No results for input(s): GLUCAP in the last 168 hours. Lipid Profile: No results for input(s): CHOL, HDL, LDLCALC, TRIG, CHOLHDL, LDLDIRECT in the last 72 hours. Thyroid Function Tests: No results for input(s): TSH, T4TOTAL, FREET4, T3FREE, THYROIDAB in the last 72 hours. Anemia Panel: No results for input(s): VITAMINB12, FOLATE, FERRITIN, TIBC, IRON, RETICCTPCT in the last 72 hours. Urine analysis:    Component Value Date/Time   COLORURINE YELLOW 01/21/2020 [place] 01/21/2020 1223   APPEARANCEUR Clear 01/03/2012 1117   LABSPEC 1.010 01/21/2020 1223   LABSPEC 1.004 01/03/2012 1117   PHURINE 7.0 01/21/2020 1223   GLUCOSEU 100 (A) 01/21/2020 1223   HGBUR TRACE-INTACT (A) 01/21/2020 1223   BILIRUBINUR NEGATIVE 01/21/2020 1223   BILIRUBINUR neg 07/30/2013 1127   BILIRUBINUR Negative 01/03/2012 [place] 01/21/2020 1223   PROTEINUR 100 07/30/2013 1127   PROTEINUR Negative 01/03/2012  1117   PROTEINUR 30 (A) 06/30/2011 0822   UROBILINOGEN 0.2 01/21/2020 1223   NITRITE NEGATIVE 01/21/2020 1223   LEUKOCYTESUR NEGATIVE 01/21/2020 1223   LEUKOCYTESUR Negative 01/03/2012 1117   Sepsis Labs: @LABRCNTIP (procalcitonin:4,lacticidven:4) ) Recent Results (from the past 240 hour(s))  Resp Panel by RT-PCR (Flu A&B, Covid) Nasopharyngeal Swab     Status: None   Collection Time: 11/06/20  8:10 AM   Specimen: Nasopharyngeal Swab; Nasopharyngeal(NP) swabs in vial transport medium  Result Value Ref Range Status   SARS Coronavirus 2 by RT PCR NEGATIVE NEGATIVE Final    Comment: (NOTE) SARS-CoV-2 target nucleic acids are NOT DETECTED.  The SARS-CoV-2 RNA is generally detectable in upper respiratory specimens during the acute phase of infection. The lowest concentration of SARS-CoV-2 viral copies this assay can detect is 138 copies/mL. A negative result does not preclude SARS-Cov-2 infection and should not be used as the sole basis for treatment or other patient management decisions. A negative result may occur with  improper specimen collection/handling, submission of specimen other than nasopharyngeal swab, presence of viral mutation(s) within the areas targeted by this assay, and inadequate number of viral copies(<138 copies/mL). A negative result must be combined with clinical observations, patient history, and epidemiological information. The expected result is Negative.  Fact Sheet for Patients:  EntrepreneurPulse.com.au  Fact Sheet for Healthcare Providers:  IncredibleEmployment.be  This test is no t yet approved or cleared by the [place] FDA and  has been authorized for detection and/or diagnosis of SARS-CoV-2 by FDA under an Emergency Use Authorization (EUA). This EUA will remain  in  effect (meaning this test can be used) for the duration of the COVID-19 declaration under Section 564(b)(1) of the Act, 21 U.S.C.section  360bbb-3(b)(1), unless the authorization is terminated  or revoked sooner.       Influenza A by PCR NEGATIVE NEGATIVE Final   Influenza B by PCR NEGATIVE NEGATIVE Final    Comment: (NOTE) The Xpert Xpress SARS-CoV-2/FLU/RSV plus assay is intended as an aid in the diagnosis of influenza from Nasopharyngeal swab specimens and should not be used as a sole basis for treatment. Nasal washings and aspirates are unacceptable for Xpert Xpress SARS-CoV-2/FLU/RSV testing.  Fact Sheet for Patients: EntrepreneurPulse.com.au  Fact Sheet for Healthcare Providers: IncredibleEmployment.be  This test is not yet approved or cleared by the [place] FDA and has been authorized for detection and/or diagnosis of SARS-CoV-2 by FDA under an Emergency Use Authorization (EUA). This EUA will remain in effect (meaning this test can be used) for the duration of the COVID-19 declaration under Section 564(b)(1) of the Act, 21 U.S.C. section 360bbb-3(b)(1), unless the authorization is terminated or revoked.  Performed at [facility], [address], [place], [place] [address]      Radiological Exams on Admission: CT ABDOMEN PELVIS WO CONTRAST  Result Date: 11/06/2020 CLINICAL DATA:  [place]. Abdominal pain. GI bleeding. Bleeding began yesterday. EXAM: CT ABDOMEN AND PELVIS WITHOUT CONTRAST TECHNIQUE: Multidetector CT imaging of the abdomen and pelvis was performed following the standard protocol without IV contrast. COMPARISON:  12/11/2012 FINDINGS: Lower chest: Lung bases are clear. Hepatobiliary: The liver parenchyma appears normal. There is free intraperitoneal air, including some insinuating into the peritoneal spaces of the porta hepatis. I do not think that this is in the biliary tree or portal vein. Gallbladder appears unremarkable. Pancreas: Normal Spleen: Normal Adrenals/Urinary Tract: Adrenal glands are normal. Considerable enlargement of bilateral  renal cysts, some of which are hyperdense. Mass lesion is not suspected, but not completely excluded by this noncontrast CT study. Consider MRI of the abdomen for better evaluation of the renal lesions. There is renal vascular calcification. I cannot rule out the possibility small nonobstructing stones. There is no hydronephrosis or passing stone. No stone in the bladder. Stomach/Bowel: Stomach appears normal. No evident acute small bowel pathology. The appendix appears normal. Large amount of fecal matter within the right colon. Diverticulosis of the left colon but without definable diverticulitis. No evidence of a colon mass or obstruction. Vascular/Lymphatic: Aortic atherosclerosis. Maximal diameter of the infrarenal abdominal aorta measures 2.6 cm. IVC is normal. There is iliac artery ectasia. No retroperitoneal adenopathy. Reproductive: Normal Other: Peritoneal dialysis catheter coiled in the pelvis. No evidence of kink. Intraperitoneal air is present, but a much larger volume than usually associated with peritoneal dialysis. This strongly suggestive bowel perforation. I think most likely thing would be a perforated diverticulum. Consider repeat scan with oral contrast. Musculoskeletal: No significant bone finding. IMPRESSION: Large amount of free intraperitoneal air, much more than usually seen in the setting of peritoneal dialysis. Suspicion of bowel perforation. No definable gastric or small bowel pathology, therefore perforated diverticulum is felt most likely. Multiple low-density lesions of the kidneys, along with some hyperdense lesions. Findings probably relate to renal cysts of varying density, enlarged since the comparison exam. However, mass lesion is not excluded and in this patient with a history of previous renal cell carcinoma on the left, MRI of the kidney should be considered electively to further evaluate. Aortic atherosclerosis. These results were called by telephone at the time of  interpretation on  11/06/2020 at 8:25 am to provider Duffy Bruce , who verbally acknowledged these results. Electronically Signed   By: Nelson Chimes M.D.   On: 11/06/2020 08:28     EKG: I have personally reviewed.  Not done in ED, will get one.   Assessment/Plan Principal Problem:   Rectal bleeding Active Problems:   Hyperlipidemia   Cerebrovascular accident (CVA) ([place])   Chronic HFrEF (heart failure with reduced ejection fraction) (HCC)   AF (paroxysmal atrial fibrillation) (HCC)   ESRD on peritoneal dialysis (HCC)   Pneumoperitoneum   HTN (hypertension)   Type II diabetes mellitus with renal manifestations (HCC)   CAD (coronary artery disease)   Sepsis (HCC)   Rectal bleeding and anemia due to blood loss and ESRD: Hgb 10.9 --> 9.9 -->9.3 --> 8.8. Nuclear medicine bleeding scan is negative. Consulted Dr. Marius Ditch of GI.   - will admitted to progressive bed as inpatient - Start IV pantoprazole 40 mg bid - Zofran IV for nausea - Avoid NSAIDs and SQ heparin - Maintain IV access (2 large bore IVs if possible). - Monitor closely and follow q6h cbc, transfuse as necessary, if Hgb<7.0 - LaB: INR, PTT and type screen - hold ASA and plavix  Sepsis and pneumoperitoneum: CT scan showed a large amount of free air in the peritoneum, suspecting perforated diverticulum.  General surgeon is consulted --> recommend conservative management due to less likely due to viscus perforation, maybe related to peritoneal dialysis catheter.  Patient meets criteria for sepsis with leukocytosis with WBC 14.9, tachypnea with RR 23, indicating possible interval abdominal infection.  Lactic acid normal.  Currently hemodynamically stable -Started Zosyn IV -Follow-up of blood culture  -will get Procalcitonin -IVF: 50 cc/h of NS while pt is on NPO  Hyperlipidemia -Crestor  Cerebrovascular accident (CVA) ([place]) -Crestor -Hold aspirin and Plavix  Chronic HFrEF (heart failure with reduced ejection fraction)  ([place]): 2D echo on 05/12/2020 showed EF of 40 to 45% with grade 1 diastolic dysfunction.  No acute exacerbation. -Volume management per renal by dialysis  AF (paroxysmal atrial fibrillation) [facility]): Patient is not taking anticoagulants -Bisoprolol  ESRD on peritoneal dialysis on peritoneal dialysis -Dr. Holley Raring of renal is consulted  HTN (hypertension) -Bisoprolol, amlodipine, clonidine patch and clonidine, torsemide, irbesartan -IV hydralazine.  Diet controlled Type II diabetes mellitus with renal manifestations [facility]): Recent A1c 6.3, well controlled, patient not taking medications currently -Check CBG every morning  CAD (coronary artery disease): No chest pain -Hold aspirin -Crestor          DVT ppx: SCD Code Status: Full code Family Communication: not done, no family member is at bed side.          Disposition Plan:  Anticipate discharge back to previous environment Consults called:  Dr. Holley Raring of renal, general surgeon, Dr. Lucila Maine and Dr. Marius Ditch of GI are consulted. Admission status and Level of care: Progressive Cardiac:    as inpt      Status is: Inpatient  Not inpatient appropriate, will call UM team and downgrade to OBS.   Dispo: The patient is from: Home              Anticipated d/c is to: Home              Patient currently is not medically stable to d/c.   Difficult to place patient No           Date of Service 11/06/2020    Ivor Costa [name]   If 7PM-7AM, please  contact night-coverage [email or web address] 11/06/2020, 11:59 AM

## 2020-11-06 NOTE — ED Notes (Signed)
Dialysis nurse in with pt and family now.

## 2020-11-06 NOTE — Consult Note (Signed)
Cephas Darby, MD [address]  [place]  [place], [place] [address]  Main: [phone]  Fax: [phone] Pager: [phone]   Consultation  Referring Provider:     No ref. provider found Primary Care Physician:  Crecencio Mc, MD Primary Gastroenterologist: Althia Forts         Reason for Consultation:     Rectal bleeding  Date of Admission:  11/06/2020 Date of Consultation:  11/06/2020         HPI:   [name] is a 75 y.o. male with history of ESRD on peritoneal dialysis, hypertension, hyperlipidemia, diabetes, stroke, history of tobacco use, history of A. fib on aspirin and Plavix presented with several episodes of painless rectal bleeding.  Patient reports that he noticed bright red blood per rectum that started since yesterday afternoon, had at least 12 episodes, last episode was today.  He does report generalized weakness.  Patient's labs revealed drop in hemoglobin from 10.9 since 08/09/2020 to 8.8 today.  Normal platelets, he does have mild leukocytosis, normal PT/INR, elevated BUN/creatinine compared to baseline.  Lactic acid was normal.  Patient denied any abdominal pain, nausea or vomiting.  When I interviewed the patient, he was undergoing peritoneal dialysis.  Patient underwent CT abdomen pelvis without contrast which revealed large amount of free intraperitoneal air, more than usual in setting of peritoneal dialysis, with suspicion for perforation and no definite gastric or small bowel pathology identified, perforated diverticulum was suspected.  Patient was evaluated by general surgery who did not recommend urgent surgical evaluation at this time and was not concerned about perforated diverticulosis.  They have attributed to peritoneal dialysis catheter and recommended conservative management.  Patient is currently n.p.o. Patient also underwent nuclear medicine bleeding scan with no evidence of active GI bleed.  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 12/2018 Echo: EF 40-45%, global HK. Asc Ao 3.7cm; b. 01/2019 Lexi MV: small, mild, fixed basal and mid antlat defect - scar vs artifact. Small, mild mid and apical inf minimally reversible defect, likely scar w/ peri-infarct ischemia. Coronary and Ao atherosclerosis; c. 12/2019 Echo: EF 40-45%, glob HK, Gr1 DD. Nl RV fxn. Sev dil LA. Mild MR. Mild-mod Ao sclerosis w/o stenosis. Asc Ao 56m.   Carotid arterial disease ([facility]    a. 01/2020 RICA 1-39%, RCCA/RECA < 514% LICA 12-39% LCCA/LECA <50%.   Claudication ([facility]    a. 02/2020 ABI/TBI: R 1.08/0.95, L 1.00/0.70.   Complication of anesthesia    had to be woken up slowly as his bp was elevated when did this quickly   Diabetes mellitus without complication (H[facility])    ESRD (end stage renal disease) ([facility]    a. Peritoneal Dialysis pt.   Hyperlipidemia    Hypertension    Migraine    cluster   Neuromuscular disorder ([facility]    left lower extrem neuropathy   Obstructive sleep apnea    no OSA since had facial surgery with dr. jKathyrn Sheriffin 1997   PAD (peripheral artery disease) ([facility] Feb 2009   nonobstructing, renal angiogram (  [place])   Renal cell carcinoma 2004   left kidney heminephrectomy   Renal insufficiency    Stroke [facility])    a. 12/2019 MRI: Small acute infarcts involving the left cerebral hemisphere and several chronic infarcts.   TIA (transient ischemic attack)    no residual but left leg and foot still feel heavy   tobacco abuse     Past Surgical History:  Procedure Laterality Date   CAPD INSERTION N/A  03/01/2019   Procedure: LAPAROSCOPIC INSERTION CONTINUOUS AMBULATORY PERITONEAL DIALYSIS  (CAPD) CATHETER;  Surgeon: Algernon Huxley, MD;  Location: [facility] ORS;  Service: Vascular;  Laterality: N/A;   CARDIAC CATHETERIZATION     Dr. [name] did this to assess his renal artery   cyst removal  12/25/2015   Spine L4 and L5   heminephrectomy  2004   for renal cell CA   RENAL CRYOABLATION  Jan 2006   right kidney,  [name]   sciatica      Prior to Admission medications   Medication Sig Start Date End Date Taking? Authorizing Provider  amLODipine (NORVASC) 10 MG tablet TAKE 1 TABLET BY MOUTH ONCE DAILY Patient taking differently: Take 10 mg by mouth daily. 06/09/20  Yes Crecencio Mc, MD  Ascorbic Acid 500 MG CHEW Chew 1 tablet by mouth daily.   Yes [provider]  aspirin 81 MG tablet Take 1 tablet (81 mg total) by mouth daily. 05/16/14  Yes Crecencio Mc, MD  b complex vitamins capsule Take 1 capsule by mouth daily.   Yes [provider]  bisoprolol (ZEBETA) 5 MG tablet Take 1 tablet (5 mg total) by mouth daily. 10/29/20  Yes End, Harrell Gave, MD  calcitRIOL (ROCALTROL) 0.25 MCG capsule Take 0.25 mcg by mouth every Monday, Wednesday, and Friday.   Yes [provider]  cloNIDine (CATAPRES - DOSED IN MG/24 HR) 0.1 mg/24hr patch Place 0.1 mg onto the skin every Friday.   Yes [provider]  cloNIDine (CATAPRES) 0.2 MG tablet Take 0.2 mg by mouth at bedtime.   Yes [provider]  clopidogrel (PLAVIX) 75 MG tablet Take 1 tablet (75 mg total) by mouth daily. 04/02/20  Yes Theora Gianotti, NP  doxazosin (CARDURA) 8 MG tablet Take 8 mg by mouth daily. 03/29/20  Yes [provider]  fluticasone (FLONASE) 50 MCG/ACT nasal spray Place 2 sprays into both nostrils daily as needed for allergies or rhinitis.   Yes [provider]  multivitamin (RENA-VIT) TABS tablet Take 1 tablet by mouth at bedtime. 12/21/19  Yes [provider]   rosuvastatin (CRESTOR) 20 MG tablet Take 1 tablet (20 mg total) by mouth at bedtime. 04/02/20 04/02/21 Yes Theora Gianotti, NP  sevelamer (RENAGEL) 800 MG tablet Take 800 mg by mouth 3 (three) times daily with meals.   Yes [provider]  torsemide (DEMADEX) 100 MG tablet Take 100 mg by mouth daily.   Yes [provider]  albuterol (VENTOLIN HFA) 108 (90 Base) MCG/ACT inhaler Inhale 2 puffs into the lungs every 6 (six) hours as needed for wheezing or shortness of breath. Patient not taking: Reported on 11/06/2020 04/12/19   Crecencio Mc, MD  diphenhydrAMINE (BENADRYL) 25 MG tablet Take 25 mg by mouth every 6 (six) hours as needed for allergies.    [provider]  ezetimibe (ZETIA) 10 MG tablet Take 1 tablet (10 mg total) by mouth daily. Patient not taking: No sig reported 05/09/19 04/02/29  Theora Gianotti, NP  irbesartan (  AVAPRO) 300 MG tablet Take 1 tablet (300 mg total) by mouth daily. 08/10/20   Debbe Odea, MD   Current Facility-Administered Medications:    acetaminophen (TYLENOL) tablet 650 mg, 650 mg, Oral, Q6H PRN, Ivor Costa, MD   albuterol (PROVENTIL) (2.5 MG/3ML) 0.083% nebulizer solution 3 mL, 3 mL, Inhalation, Q4H PRN, Ivor Costa, MD   dextromethorphan-guaiFENesin (MUCINEX DM) 30-600 MG per 12 hr tablet 1 tablet, 1 tablet, Oral, BID PRN, Ivor Costa, MD   dialysis solution 1.5% low-MG/low-CA dianeal solution, , Intraperitoneal, Q24H, Breeze, Shantelle, NP   enalaprilat (VASOTEC) injection 0.625 mg, 0.625 mg, Intravenous, Q3H PRN, Ivor Costa, MD   gentamicin cream (GARAMYCIN) 0.1 % 1 application, 1 application, Topical, Daily, Breeze, Shantelle, NP   heparin 1000 unit/ml injection 500 Units, 500 Units, Intraperitoneal, PRN, Breeze, Shantelle, NP   ipratropium-albuterol (DUONEB) 0.5-2.5 (3) MG/3ML nebulizer solution 3 mL, 3 mL, Nebulization, Q6H, Tullo, Teresa L, MD   morphine 2 MG/ML injection 1 mg, 1 mg, Intravenous, Q4H PRN, Ivor Costa, MD   nicotine (NICODERM CQ - dosed in mg/24 hours) patch 21 mg, 21 mg, Transdermal, Daily, Ivor Costa, MD, 21 mg at 11/06/20 0947   ondansetron (ZOFRAN) injection 4 mg, 4 mg, Intravenous, Q8H PRN, Ivor Costa, MD   pantoprazole (PROTONIX) injection 40 mg, 40 mg, Intravenous, Q12H, Ivor Costa, MD, 40 mg at 11/06/20 1037   piperacillin-tazobactam (ZOSYN) IVPB 2.25 g, 2.25 g, Intravenous, Q8H, Beers, Shanon Brow, RPH  Current Outpatient Medications:    amLODipine (NORVASC) 10 MG tablet, TAKE 1 TABLET BY MOUTH ONCE DAILY (Patient taking differently: Take 10 mg by mouth daily.), Disp: 90 tablet, Rfl: 1   Ascorbic Acid 500 MG CHEW, Chew 1 tablet by mouth daily., Disp: , Rfl:    aspirin 81 MG tablet, Take 1 tablet (81 mg total) by mouth daily., Disp: 30 tablet, Rfl: 11   b complex vitamins capsule, Take 1 capsule by mouth daily., Disp: , Rfl:    bisoprolol (ZEBETA) 5 MG tablet, Take 1 tablet (5 mg total) by mouth daily., Disp: 30 tablet, Rfl: 5   calcitRIOL (ROCALTROL) 0.25 MCG capsule, Take 0.25 mcg by mouth every Monday, Wednesday, and Friday., Disp: , Rfl: 3   cloNIDine (CATAPRES - DOSED IN MG/24 HR) 0.1 mg/24hr patch, Place 0.1 mg onto the skin every Friday., Disp: , Rfl:    cloNIDine (CATAPRES) 0.2 MG tablet, Take 0.2 mg by mouth at bedtime., Disp: , Rfl:    clopidogrel (PLAVIX) 75 MG tablet, Take 1 tablet (75 mg total) by mouth daily., Disp: 90 tablet, Rfl: 1   doxazosin (CARDURA) 8 MG tablet, Take 8 mg by mouth daily., Disp: , Rfl:    fluticasone (FLONASE) 50 MCG/ACT nasal spray, Place 2 sprays into both nostrils daily as needed for allergies or rhinitis., Disp: , Rfl:    multivitamin (RENA-VIT) TABS tablet, Take 1 tablet by mouth at bedtime., Disp: , Rfl:    rosuvastatin (CRESTOR) 20 MG tablet, Take 1 tablet (20 mg total) by mouth at bedtime., Disp: 90 tablet, Rfl: 1   sevelamer (RENAGEL) 800 MG tablet, Take 800 mg by mouth 3 (three) times daily with meals., Disp: , Rfl:    torsemide  (DEMADEX) 100 MG tablet, Take 100 mg by mouth daily., Disp: , Rfl:    albuterol (VENTOLIN HFA) 108 (90 Base) MCG/ACT inhaler, Inhale 2 puffs into the lungs every 6 (six) hours as needed for wheezing or shortness of breath. (Patient not taking: Reported on 11/06/2020), Disp: 18 g, Rfl:  0   diphenhydrAMINE (BENADRYL) 25 MG tablet, Take 25 mg by mouth every 6 (six) hours as needed for allergies., Disp: , Rfl:    ezetimibe (ZETIA) 10 MG tablet, Take 1 tablet (10 mg total) by mouth daily. (Patient not taking: No sig reported), Disp: 90 tablet, Rfl: 3   irbesartan (AVAPRO) 300 MG tablet, Take 1 tablet (300 mg total) by mouth daily., Disp: 30 tablet, Rfl: 0   Family History  Problem Relation Age of Onset   Hypertension Mother    Cancer Mother        breast   Aneurysm Mother    Coronary artery disease Father    Hypertension Father    Stroke Father 70   Heart disease Father    Heart attack Father 74   Aneurysm Maternal Grandmother        brain   Aneurysm Paternal Grandmother        brain   Coronary artery disease Paternal Grandfather    Heart disease Brother        valvular heart disease   COPD Brother    Hypertension Brother    Stroke Paternal Uncle      Social History   Tobacco Use   Smoking status: Every Day    Packs/day: 1.00    Years: 50.00    Pack years: 50.00    Types: Cigarettes   Smokeless tobacco: Never  Vaping Use   Vaping Use: Former   Devices: tried but did not like  Substance Use Topics   Alcohol use: Not Currently   Drug use: No    Allergies as of 11/06/2020 - Review Complete 11/06/2020  Allergen Reaction Noted   Irbesartan Other (See Comments) 07/26/2017   Cymbalta [duloxetine hcl]  04/12/2019   Hydralazine Other (See Comments) 08/06/2020   Imdur [isosorbide nitrate] Other (See Comments) 08/06/2020   Atorvastatin Other (See Comments) 50/93/2671   Bystolic [nebivolol hcl]  05/11/2017    Review of Systems:    All systems reviewed and negative except where  noted in HPI.   Physical Exam:  Vital signs in last 24 hours: Temp:  [97.7 F (36.5 C)] 97.7 F (36.5 C) (06/16 0636) Pulse Rate:  [60-75] 72 (06/16 1600) Resp:  [17-27] 20 (06/16 1519) BP: (128-196)/(53-84) 181/63 (06/16 1530) SpO2:  [97 %-100 %] 99 % (06/16 1600) Weight:  [62.6 kg] 62.6 kg (06/16 0636)   General:   Pleasant, cooperative in NAD Head:  Normocephalic and atraumatic. Eyes:   No icterus.   Conjunctiva pink. PERRLA. Ears:  Normal auditory acuity. Neck:  Supple; no masses or thyroidomegaly Lungs: Respirations even and unlabored. Lungs clear to auscultation bilaterally.   No wheezes, crackles, or rhonchi.  Heart:  Regular rate and rhythm;  Without murmur, clicks, rubs or gallops Abdomen:  Soft, nondistended, nontender.  PD catheter in place, undergoing dialysis, normal bowel sounds. No appreciable masses or hepatomegaly.  No rebound or guarding.  Rectal:  Not performed. Msk:  Symmetrical without gross deformities.  Strength generalized weakness Extremities:  Without edema, cyanosis or clubbing. Neurologic:  Alert and oriented x3;  grossly normal neurologically. Skin:  Intact without significant lesions or rashes. Psych:  Alert and cooperative. Normal affect.  LAB RESULTS: CBC Latest Ref Rng & Units 11/06/2020 11/06/2020 11/06/2020  WBC 4.0 - 10.5 K/uL 12.1(H) 12.9(H) 14.9(H)  Hemoglobin 13.0 - 17.0 g/dL 8.8(L) 9.3(L) 9.9(L)  Hematocrit 39.0 - 52.0 % 25.1(L) 26.1(L) 28.2(L)  Platelets 150 - 400 K/uL 192 204 203    BMET  BMP Latest Ref Rng & Units 11/06/2020 08/09/2020 08/08/2020  Glucose 70 - 99 mg/dL 170(H) 153(H) 159(H)  BUN 8 - 23 mg/dL 61(H) 38(H) 42(H)  Creatinine 0.61 - 1.24 mg/dL 7.51(H) 6.44(H) 6.62(H)  Sodium 135 - 145 mmol/L 137 138 140  Potassium 3.5 - 5.1 mmol/L 3.7 3.5 3.7  Chloride 98 - 111 mmol/L 103 106 107  CO2 22 - 32 mmol/L _0 Calcium 8.9 - 10.3 mg/dL 8.6(L) 8.0(L) 8.3(L)    LFT Hepatic Function Latest Ref Rng & Units 11/06/2020 01/14/2020  05/29/2018  Total Protein 6.5 - 8.1 g/dL 5.8(L) 6.6 7.0  Albumin 3.5 - 5.0 g/dL 2.9(L) 3.3(L) 4.0  AST 15 - 41 U/L 12(L) 9(L) 9  ALT 0 - 44 U/L _1 Alk Phosphatase 38 - 126 U/L 93 101 101  Total Bilirubin 0.3 - 1.2 mg/dL 0.8 0.9 0.5  Bilirubin, Direct 0.0 - 0.3 mg/dL - - -     STUDIES: CT ABDOMEN PELVIS WO CONTRAST  Result Date: 11/06/2020 CLINICAL DATA:  Melena. Abdominal pain. GI bleeding. Bleeding began yesterday. EXAM: CT ABDOMEN AND PELVIS WITHOUT CONTRAST TECHNIQUE: Multidetector CT imaging of the abdomen and pelvis was performed following the standard protocol without IV contrast. COMPARISON:  12/11/2012 FINDINGS: Lower chest: Lung bases are clear. Hepatobiliary: The liver parenchyma appears normal. There is free intraperitoneal air, including some insinuating into the peritoneal spaces of the porta hepatis. I do not think that this is in the biliary tree or portal vein. Gallbladder appears unremarkable. Pancreas: Normal Spleen: Normal Adrenals/Urinary Tract: Adrenal glands are normal. Considerable enlargement of bilateral renal cysts, some of which are hyperdense. Mass lesion is not suspected, but not completely excluded by this noncontrast CT study. Consider MRI of the abdomen for better evaluation of the renal lesions. There is renal vascular calcification. I cannot rule out the possibility small nonobstructing stones. There is no hydronephrosis or passing stone. No stone in the bladder. Stomach/Bowel: Stomach appears normal. No evident acute small bowel pathology. The appendix appears normal. Large amount of fecal matter within the right colon. Diverticulosis of the left colon but without definable diverticulitis. No evidence of a colon mass or obstruction. Vascular/Lymphatic: Aortic atherosclerosis. Maximal diameter of the infrarenal abdominal aorta measures 2.6 cm. IVC is normal. There is iliac artery ectasia. No retroperitoneal adenopathy. Reproductive: Normal Other: Peritoneal  dialysis catheter coiled in the pelvis. No evidence of kink. Intraperitoneal air is present, but a much larger volume than usually associated with peritoneal dialysis. This strongly suggestive bowel perforation. I think most likely thing would be a perforated diverticulum. Consider repeat scan with oral contrast. Musculoskeletal: No significant bone finding. IMPRESSION: Large amount of free intraperitoneal air, much more than usually seen in the setting of peritoneal dialysis. Suspicion of bowel perforation. No definable gastric or small bowel pathology, therefore perforated diverticulum is felt most likely. Multiple low-density lesions of the kidneys, along with some hyperdense lesions. Findings probably relate to renal cysts of varying density, enlarged since the comparison exam. However, mass lesion is not excluded and in this patient with a history of previous renal cell carcinoma on the left, MRI of the kidney should be considered electively to further evaluate. Aortic atherosclerosis. These results were called by telephone at the time of interpretation on 11/06/2020 at 8:25 am to provider [facility] , who verbally acknowledged these results. Electronically Signed   By: Nelson Chimes M.D.   On: 11/06/2020 08:28   NM GI Blood Loss  Result Date: 11/06/2020  CLINICAL DATA:  Bright red blood in rectum 1 day prior EXAM: NUCLEAR MEDICINE GASTROINTESTINAL BLEEDING SCAN TECHNIQUE: Sequential abdominal images were obtained following intravenous administration of Tc-43mlabeled red blood cells. RADIOPHARMACEUTICALS:  20.95 mCi [id number] in-vitro labeled red cells. COMPARISON:  CT abdomen and pelvis November 06, 2020 FINDINGS: There is no abnormal focus of radiotracer uptake to suggest focal site of active gastrointestinal bleeding. Note penile uptake of radiotracer which may be seen normally. IMPRESSION: No abnormal focus of radiotracer uptake to suggest focus of active gastrointestinal bleeding.  Electronically Signed   By: WiLowella GripII M.D.   On: 11/06/2020 15:29      Impression / Plan:   [name] a [age].o. male with history of CHF, coronary disease, A. fib, peripheral artery disease on aspirin and aspirin and Plavix, ESRD on peritoneal dialysis presented to ER with several episodes of painless bright red blood per rectum with acute blood loss anemia  Rectal bleeding with acute blood loss anemia: Patient is hemodynamically stable, most likely diverticular bleed Nuclear medicine bleeding scan is negative CT of the abdomen revealed large amount of free air in the peritoneum with suspicion for perforated diverticulum.  Patient is evaluated by general surgery, recommend conservative management due to less likely possibility of viscus perforation Given that patient's last dose of Plavix was yesterday morning and remote possibility of perforated diverticulum, I recommend to defer colonoscopy at this time, reassess tomorrow regarding the timing of the procedure N.p.o. except ice chips and meds Continue empiric antibiotics for now Monitor CBC closely to maintain hemoglobin above 8 I have explained my recommendations with both patient and his wife who are agreeable  Thank you for involving me in the care of this patient.  GI will continue to follow along with you    LOS: 0 days   RoSherri SearMD  11/06/2020, 5:19 PM    Note: This dictation was prepared with Dragon dictation along with smaller phrase technology. Any transcriptional errors that result from this process are unintentional.

## 2020-11-06 NOTE — Consult Note (Addendum)
[facility] SURGICAL CONSULTATION NOTE (initial) - cpt: [id number]   HISTORY OF PRESENT ILLNESS (HPI):  75 y.o. male presented to [facility] ED today for evaluation of rectal bleeding. Patient reports he initially noticed an increase in gas yesterday afternoon which was followed by a grossly bloody bowel movement. He reports these bloody bowel movements persisted throughout the evening and into the morning. He reports >10 bloody bowel movements. Blood was initially mixed into the stool but turned into only passing gas and bright reb blood. No reports of melena. Endorses associated lightheadedness. No fever, chills, CP, SOB, nausea, emesis, nor abdominal pain. He is on Plavix secondary to PVD and history of stroke. Also with ESRD with peritoneal dialysis cathter. His runs this at home each night. History of heminephrectomy in 2004 for renal cell CA. Last colonoscopy >25 years ago. Work up in the ED revealed a leukocytosis to 14.9K, Hgb 9.9 (from 10.9 two months ago), CMP consistent with ESRD, lactic acid level normal at 1.0. He did have CT Abdomen/Pelvis which was concerning for marked pneumoperitoneum without obvious source of perforation.   Since coming to the ED, patient reports he overall feels much better and hematochezia has slows/subsided.   Surgery is consulted by emergency medicine physician Dr. Duffy Bruce, MD in this context for evaluation and management of pneumoperitoneum in setting of GI bleed and PD catheter.   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 12/2018 Echo: EF 40-45%, global HK. Asc Ao 3.7cm; b. 01/2019 [place] MV: small, mild, fixed basal and mid antlat defect - scar vs artifact. Small, mild mid and apical inf  minimally reversible defect, likely scar w/ peri-infarct ischemia. Coronary and Ao atherosclerosis; c. 12/2019 Echo: EF 40-45%, glob HK, Gr1 DD. Nl RV fxn. Sev dil LA. Mild MR. Mild-mod Ao sclerosis w/o stenosis. Asc Ao 17mm.   Carotid arterial disease ([place])    a. 01/2020 RICA 1-39%, RCCA/RECA < 62%, LICA 9-52%, LCCA/LECA <50%.   Claudication ([place])    a. 02/2020 ABI/TBI: R 1.08/0.95, L 1.00/0.70.   Complication of anesthesia    had to be woken up slowly as his bp was elevated when did this quickly   Diabetes mellitus without complication (HCC)    ESRD (end stage renal disease) ([place])    a. Peritoneal Dialysis pt.   Hyperlipidemia    Hypertension    Migraine    cluster   Neuromuscular disorder ([place])    left lower extrem neuropathy   Obstructive sleep apnea    no OSA since had facial surgery with dr. Kathyrn Sheriff in 1997   PAD (peripheral artery disease) [facility]) Feb 2009   nonobstructing, renal angiogram [name]   Renal cell carcinoma 2004   left kidney heminephrectomy   Renal insufficiency    Stroke [facility])    a. 12/2019 MRI: Small acute infarcts involving the left cerebral hemisphere and several chronic infarcts.   TIA (transient ischemic attack)    no residual but left leg and foot still feel heavy   tobacco abuse      PAST SURGICAL HISTORY ([place]):  Past Surgical History:  Procedure Laterality Date   CAPD INSERTION N/A 03/01/2019   Procedure: LAPAROSCOPIC INSERTION CONTINUOUS AMBULATORY PERITONEAL DIALYSIS  (CAPD) CATHETER;  Surgeon: Lucky Cowboy,  Erskine Squibb, MD;  Location: [facility] ORS;  Service: Vascular;  Laterality: N/A;   CARDIAC CATHETERIZATION     Dr. [name] did this to assess his renal artery   cyst removal  12/25/2015   Spine L4 and L5   heminephrectomy  2004   for renal cell CA   RENAL CRYOABLATION  Jan 2006   right kidney,  [name]   sciatica       MEDICATIONS:  Prior to Admission medications   Medication Sig Start Date End Date Taking? Authorizing Provider  albuterol (VENTOLIN HFA) 108  (90 Base) MCG/ACT inhaler Inhale 2 puffs into the lungs every 6 (six) hours as needed for wheezing or shortness of breath. 04/12/19   Crecencio Mc, MD  amLODipine (NORVASC) 10 MG tablet TAKE 1 TABLET BY MOUTH ONCE DAILY 06/09/20   Crecencio Mc, MD  aspirin 81 MG tablet Take 1 tablet (81 mg total) by mouth daily. 05/16/14   Crecencio Mc, MD  bisoprolol (ZEBETA) 5 MG tablet Take 1 tablet (5 mg total) by mouth daily. 10/29/20   End, Harrell Gave, MD  calcitRIOL (ROCALTROL) 0.25 MCG capsule Take 0.25 mcg by mouth every Monday, Wednesday, and Friday.    [provider]  cloNIDine (CATAPRES - DOSED IN MG/24 HR) 0.1 mg/24hr patch Place 0.1 mg onto the skin once a week.    [provider]  cloNIDine (CATAPRES) 0.3 MG tablet Take 0.3 mg by mouth at bedtime.    [provider]  clopidogrel (PLAVIX) 75 MG tablet Take 1 tablet (75 mg total) by mouth daily. 04/02/20   Theora Gianotti, NP  diphenhydrAMINE (BENADRYL) 25 MG tablet Take 25 mg by mouth every 6 (six) hours as needed for allergies.    [provider]  doxazosin (CARDURA) 8 MG tablet Take 8 mg by mouth daily. 03/29/20   [provider]  ergocalciferol (VITAMIN D2) 1.25 MG (50000 UT) capsule Take 50,000 Units by mouth once a week. 09/18/20 09/18/21  [provider]  ezetimibe (ZETIA) 10 MG tablet Take 1 tablet (10 mg total) by mouth daily. 05/09/19 04/02/29  Theora Gianotti, NP  fluticasone (FLONASE) 50 MCG/ACT nasal spray Place 2 sprays into both nostrils daily as needed for allergies or rhinitis.    [provider]  irbesartan (AVAPRO) 300 MG tablet Take 1 tablet (300 mg total) by mouth daily. 08/10/20   Debbe Odea, MD  multivitamin (RENA-VIT) TABS tablet Take 1 tablet by mouth at bedtime. 12/21/19   [provider]  rosuvastatin (CRESTOR) 20 MG tablet Take 1 tablet (20 mg total) by mouth at bedtime. 04/02/20 04/02/21  Theora Gianotti, NP  torsemide  (DEMADEX) 20 MG tablet Take 20 mg by mouth 2 (two) times daily. 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, advertising  Tobacco Use   Smoking status: Every Day    Packs/day: 1.00    Years: 50.00    Pack years: 50.00    Types: Cigarettes   Smokeless tobacco: Never  Vaping Use   Vaping Use: Former   Devices: tried but did not like  Substance and Sexual Activity   Alcohol use: Not Currently   Drug use: No   Sexual activity: Yes  Other Topics Concern   Not on file  Social History Narrative   Not on file   Social Determinants of Health   Financial Resource Strain: Low Risk    Difficulty of Paying Living Expenses: Not hard at all  Food Insecurity: No Food Insecurity   Worried About Charity fundraiser in the Last Year: Never true   [place] in the Last Year: Never true  Transportation Needs: No Transportation Needs   Lack of Transportation (Medical): No   Lack of Transportation (Non-Medical): No  Physical Activity: Insufficiently Active   Days of Exercise per Week: 3 days   Minutes of Exercise per Session: 20 min  Stress: No Stress Concern Present   Feeling of Stress : Not at all  Social Connections: Not on file  Intimate Partner Violence: Not At Risk   Fear of Current or Ex-Partner: No   Emotionally Abused: No   Physically Abused: No   Sexually Abused: No      FAMILY HISTORY:  Family History  Problem Relation Age of Onset   Hypertension Mother    Cancer Mother        breast   Aneurysm Mother    Coronary artery disease Father    Hypertension Father    Stroke Father 58   Heart disease Father    Heart attack Father 44   Aneurysm Maternal Grandmother        brain   Aneurysm Paternal Grandmother        brain   Coronary artery disease Paternal Grandfather    Heart disease Brother        valvular heart disease   COPD Brother    Hypertension Brother    Stroke Paternal Uncle       REVIEW OF SYSTEMS:  Review of Systems  Constitutional:  Negative for chills and fever.  HENT:  Negative for congestion and sore throat.   Respiratory:  Negative for cough and shortness of breath.   Cardiovascular:  Negative for chest pain and palpitations.  Gastrointestinal:  Positive for blood in stool. Negative for abdominal pain, nausea and vomiting.  Neurological:  Positive for dizziness (Lightheaded). Negative for weakness.  All other systems reviewed and are negative.  VITAL SIGNS:  Temp:  [97.7 F (36.5 C)] 97.7 F (36.5 C) (06/16 0636) Pulse Rate:  [60-72] 63 (06/16 0745) Resp:  [18-23] 18 (06/16 0745) BP: (128-183)/(53-68) 183/68 (06/16 0730) SpO2:  [97 %-100 %] 99 % (06/16 0745) Weight:  [62.6 kg] 62.6 kg (06/16 0636)     Height: 5\' 6"  (167.6 cm) Weight: 62.6 kg BMI (Calculated): 22.28   INTAKE/OUTPUT:  No intake/output data recorded.  PHYSICAL EXAM:  Physical Exam Vitals and nursing note reviewed. Exam conducted with a chaperone present.  Constitutional:      General: He is not in acute distress.    Appearance: Normal appearance. He is not ill-appearing.  HENT:     Head: Normocephalic and atraumatic.  Eyes:     General: No scleral icterus.  Conjunctiva/sclera: Conjunctivae normal.  Cardiovascular:     Rate and Rhythm: Normal rate.     Pulses: Normal pulses.     Heart sounds: No murmur heard. Pulmonary:     Effort: Pulmonary  effort is normal. No respiratory distress.  Abdominal:     General: Abdomen is flat. There is no distension.     Palpations: Abdomen is soft.     Tenderness: There is no abdominal tenderness. There is no guarding or rebound.     Comments: Abdomen is soft, non-tender, non-distended, no rebound/guarding. Certainly without any evidence of peritonitis. Peritoneal dialysis catheter present, site CDI  Genitourinary:    Comments: External rectal examination reveals gross hematochezia, no active bleeding, no obvious external hemorrhoids  Musculoskeletal:     Right lower leg: No edema.     Left lower leg: No edema.  Skin:    General: Skin is warm and dry.     Coloration: Skin is not pale.     Findings: No erythema.  Neurological:     General: No focal deficit present.     Mental Status: He is alert and oriented to person, place, and time.  Psychiatric:        Mood and Affect: Mood normal.        Behavior: Behavior normal.     Labs:  CBC Latest Ref Rng & Units 11/06/2020 08/09/2020 08/06/2020  WBC 4.0 - 10.5 K/uL 14.9(H) 7.8 10.3  Hemoglobin 13.0 - 17.0 g/dL 9.9(L) 10.9(L) 10.7(L)  Hematocrit 39.0 - 52.0 % 28.2(L) 31.6(L) 31.6(L)  Platelets 150 - 400 K/uL 203 163 188   CMP Latest Ref Rng & Units 11/06/2020 08/09/2020 08/08/2020  Glucose 70 - 99 mg/dL 170(H) 153(H) 159(H)  BUN 8 - 23 mg/dL 61(H) 38(H) 42(H)  Creatinine 0.61 - 1.24 mg/dL 7.51(H) 6.44(H) 6.62(H)  Sodium 135 - 145 mmol/L 137 138 140  Potassium 3.5 - 5.1 mmol/L 3.7 3.5 3.7  Chloride 98 - 111 mmol/L 103 106 107  CO2 22 - 32 mmol/L 23 25 24   Calcium 8.9 - 10.3 mg/dL 8.6(L) 8.0(L) 8.3(L)  Total Protein 6.5 - 8.1 g/dL 5.8(L) - -  Total Bilirubin 0.3 - 1.2 mg/dL 0.8 - -  Alkaline Phos 38 - 126 U/L 93 - -  AST 15 - 41 U/L 12(L) - -  ALT 0 - 44 U/L 11 - -     Imaging studies:   CT Abdomen/Pelvis (11/06/2020) personally reviewed which does show a relatively marked amount of pneumoperitoneum, no obvious source, does have  significant diverticulosis, peritoneal dialysis catheter in place, and radiologist report reviewed:  IMPRESSION: Large amount of free intraperitoneal air, much more than usually seen in the setting of peritoneal dialysis. Suspicion of bowel perforation. No definable gastric or small bowel pathology, therefore perforated diverticulum is felt most likely.   Multiple low-density lesions of the kidneys, along with some hyperdense lesions. Findings probably relate to renal cysts of varying density, enlarged since the comparison exam. However, mass lesion is not excluded and in this patient with a history of previous renal cell carcinoma on the left, MRI of the kidney should be considered electively to further evaluate.   Aortic atherosclerosis.   Assessment/Plan: (ICD-10's: K66.8, K45.2) 75 y.o. male with bright red blood per rectum incidentally found to have pneumoperitoneum without evidence of sepsis nor peritonitis on examination, likely attributable to PD catheter, complicated by pertinent comorbidities including CVA/PVD on Plavix, ESRD on PD.   - Fortunately, patient denied any abdominal pain, his examination is  benign and without peritonitis. He does has a leukocytosis to 14K but lactic acid level is normal at 1.0. Suspect pneumoperitoneum is secondary to PD catheter rather than hollow viscus perforation. No need for emergent surgical intervention; we will follow closely.    - Monitor H&H; transfuse as needed - Would recommend GI vs Vascular surgery consultation should bleeding persist in attempt to localize this. Suspect there will be some hesitation to perform colonoscopy in setting of pneumoperitoneum, but again, low clinical suspicion for perforation at this time and pneumoperitoneum thought to be related to PD catheter.   - Monitor abdominal examination - Agree with prophylactic IV Abx (Zosyn) for intra-abdominal coverage - Pain control prn; antiemetics prn - Monitor leukocytosis  -  DVT prophylaxis; hold in setting of GI bleeding   - Further management per primary service; we will follow along   All of the above findings and recommendations were discussed with the patient, and all of patient's questions were answered to his expressed satisfaction.  Thank you for the opportunity to participate in this patient's care.   -- Edison Simon, PA-C [facility] 11/06/2020, 9:04 AM [phone] M-F: 7am - 4pm

## 2020-11-06 NOTE — Progress Notes (Signed)
[facility]  ROUNDING NOTE   Subjective:   [name] is a 75 y.o. male with past medical history of sCHF, CAD, A-fib, PAD, sleep apnea, diabetes, renal cell carcinoma, and ESRD on PD. He presents to the ED with bloody stools.   Patient is a current patient of this practice with Dr Juleen China as his nephrologist. He states stools began yesterday afternoon. They were liquid and bloody. He remained at home to complete nightly PD. He states he had numerous episodes during the night and decided to be evaluated once PD was complete. He states he had nausea with no vomiting. Denies shortness of breath. Arrival Hgb 9.3.    Objective:  Vital signs in last 24 hours:  Temp:  [97.7 F (36.5 C)] 97.7 F (36.5 C) (06/16 0636) Pulse Rate:  [60-75] 72 (06/16 1600) Resp:  [17-27] 20 (06/16 1519) BP: (128-196)/(53-84) 181/63 (06/16 1530) SpO2:  [97 %-100 %] 99 % (06/16 1600) Weight:  [62.6 kg] 62.6 kg (06/16 0636)  Weight change:  Filed Weights   11/06/20 0636  Weight: 62.6 kg    Intake/Output: No intake/output data recorded.   Intake/Output this shift:  Total I/O In: 50 [IV Piggyback:50] Out: -   Physical Exam: General: NAD, resting on stretcher  Head: Normocephalic, atraumatic. Moist oral mucosal membranes  Eyes: Anicteric  Lungs:  Clear to auscultation  Heart: Regular rate and rhythm  Abdomen:  Soft, nontender, PD catheter  Extremities:  no peripheral edema.  Neurologic: Nonfocal, moving all four extremities  Skin: No lesions  Access: PD catheter    Basic Metabolic Panel: Recent Labs  Lab 11/06/20 0651  NA 137  K 3.7  CL 103  CO2 23  GLUCOSE 170*  BUN 61*  CREATININE 7.51*  CALCIUM 8.6*    Liver Function Tests: Recent Labs  Lab 11/06/20 0651  AST 12*  ALT 11  ALKPHOS 93  BILITOT 0.8  PROT 5.8*  ALBUMIN 2.9*   Recent Labs  Lab 11/06/20 0651  LIPASE 28   No results for input(s): AMMONIA in the last 168 hours.  CBC: Recent Labs  Lab  11/06/20 0651 11/06/20 1039  WBC 14.9* 12.9*  NEUTROABS 9.5*  --   HGB 9.9* 9.3*  HCT 28.2* 26.1*  MCV 88.4 86.7  PLT 203 204    Cardiac Enzymes: No results for input(s): CKTOTAL, CKMB, CKMBINDEX, TROPONINI in the last 168 hours.  BNP: Invalid input(s): POCBNP  CBG: No results for input(s): GLUCAP in the last 168 hours.  Microbiology: Results for orders placed or performed during the hospital encounter of 11/06/20  Resp Panel by RT-PCR (Flu A&B, Covid) Nasopharyngeal Swab     Status: None   Collection Time: 11/06/20  8:10 AM   Specimen: Nasopharyngeal Swab; Nasopharyngeal(NP) swabs in vial transport medium  Result Value Ref Range Status   SARS Coronavirus 2 by RT PCR NEGATIVE NEGATIVE Final    Comment: (NOTE) SARS-CoV-2 target nucleic acids are NOT DETECTED.  The SARS-CoV-2 RNA is generally detectable in upper respiratory specimens during the acute phase of infection. The lowest concentration of SARS-CoV-2 viral copies this assay can detect is 138 copies/mL. A negative result does not preclude SARS-Cov-2 infection and should not be used as the sole basis for treatment or other patient management decisions. A negative result may occur with  improper specimen collection/handling, submission of specimen other than nasopharyngeal swab, presence of viral mutation(s) within the areas targeted by this assay, and inadequate number of viral copies(<138 copies/mL). A negative  result must be combined with clinical observations, patient history, and epidemiological information. The expected result is Negative.  Fact Sheet for Patients:  EntrepreneurPulse.com.au  Fact Sheet for Healthcare Providers:  IncredibleEmployment.be  This test is no t yet approved or cleared by the [place] FDA and  has been authorized for detection and/or diagnosis of SARS-CoV-2 by FDA under an Emergency Use Authorization (EUA). This EUA will remain  in effect  (meaning this test can be used) for the duration of the COVID-19 declaration under Section 564(b)(1) of the Act, 21 U.S.C.section 360bbb-3(b)(1), unless the authorization is terminated  or revoked sooner.       Influenza A by PCR NEGATIVE NEGATIVE Final   Influenza B by PCR NEGATIVE NEGATIVE Final    Comment: (NOTE) The Xpert Xpress SARS-CoV-2/FLU/RSV plus assay is intended as an aid in the diagnosis of influenza from Nasopharyngeal swab specimens and should not be used as a sole basis for treatment. Nasal washings and aspirates are unacceptable for Xpert Xpress SARS-CoV-2/FLU/RSV testing.  Fact Sheet for Patients: EntrepreneurPulse.com.au  Fact Sheet for Healthcare Providers: IncredibleEmployment.be  This test is not yet approved or cleared by the [place] FDA and has been authorized for detection and/or diagnosis of SARS-CoV-2 by FDA under an Emergency Use Authorization (EUA). This EUA will remain in effect (meaning this test can be used) for the duration of the COVID-19 declaration under Section 564(b)(1) of the Act, 21 U.S.C. section 360bbb-3(b)(1), unless the authorization is terminated or revoked.  Performed at [facility], [address] [place], [place] [address]     Coagulation Studies: Recent Labs    11/06/20 0651  LABPROT 14.1  INR 1.1    Urinalysis: Recent Labs    11/06/20 1159  COLORURINE YELLOW*  LABSPEC 1.009  PHURINE 7.0  GLUCOSEU 50*  HGBUR NEGATIVE  BILIRUBINUR NEGATIVE  KETONESUR NEGATIVE  PROTEINUR 100*  NITRITE NEGATIVE  LEUKOCYTESUR NEGATIVE      Imaging: CT ABDOMEN PELVIS WO CONTRAST  Result Date: 11/06/2020 CLINICAL DATA:  Melena. Abdominal pain. GI bleeding. Bleeding began yesterday. EXAM: CT ABDOMEN AND PELVIS WITHOUT CONTRAST TECHNIQUE: Multidetector CT imaging of the abdomen and pelvis was performed following the standard protocol without IV contrast. COMPARISON:  12/11/2012  FINDINGS: Lower chest: Lung bases are clear. Hepatobiliary: The liver parenchyma appears normal. There is free intraperitoneal air, including some insinuating into the peritoneal spaces of the porta hepatis. I do not think that this is in the biliary tree or portal vein. Gallbladder appears unremarkable. Pancreas: Normal Spleen: Normal Adrenals/Urinary Tract: Adrenal glands are normal. Considerable enlargement of bilateral renal cysts, some of which are hyperdense. Mass lesion is not suspected, but not completely excluded by this noncontrast CT study. Consider MRI of the abdomen for better evaluation of the renal lesions. There is renal vascular calcification. I cannot rule out the possibility small nonobstructing stones. There is no hydronephrosis or passing stone. No stone in the bladder. Stomach/Bowel: Stomach appears normal. No evident acute small bowel pathology. The appendix appears normal. Large amount of fecal matter within the right colon. Diverticulosis of the left colon but without definable diverticulitis. No evidence of a colon mass or obstruction. Vascular/Lymphatic: Aortic atherosclerosis. Maximal diameter of the infrarenal abdominal aorta measures 2.6 cm. IVC is normal. There is iliac artery ectasia. No retroperitoneal adenopathy. Reproductive: Normal Other: Peritoneal dialysis catheter coiled in the pelvis. No evidence of kink. Intraperitoneal air is present, but a much larger volume than usually associated with peritoneal dialysis. This strongly suggestive bowel perforation.  I think most likely thing would be a perforated diverticulum. Consider repeat scan with oral contrast. Musculoskeletal: No significant bone finding. IMPRESSION: Large amount of free intraperitoneal air, much more than usually seen in the setting of peritoneal dialysis. Suspicion of bowel perforation. No definable gastric or small bowel pathology, therefore perforated diverticulum is felt most likely. Multiple low-density  lesions of the kidneys, along with some hyperdense lesions. Findings probably relate to renal cysts of varying density, enlarged since the comparison exam. However, mass lesion is not excluded and in this patient with a history of previous renal cell carcinoma on the left, MRI of the kidney should be considered electively to further evaluate. Aortic atherosclerosis. These results were called by telephone at the time of interpretation on 11/06/2020 at 8:25 am to provider [facility] , who verbally acknowledged these results. Electronically Signed   By: Nelson Chimes M.D.   On: 11/06/2020 08:28   NM GI Blood Loss  Result Date: 11/06/2020 CLINICAL DATA:  Bright red blood in rectum 1 day prior EXAM: NUCLEAR MEDICINE GASTROINTESTINAL BLEEDING SCAN TECHNIQUE: Sequential abdominal images were obtained following intravenous administration of Tc-20m labeled red blood cells. RADIOPHARMACEUTICALS:  20.95 mCi Tc-16m pertechnetate in-vitro labeled red cells. COMPARISON:  CT abdomen and pelvis November 06, 2020 FINDINGS: There is no abnormal focus of radiotracer uptake to suggest focal site of active gastrointestinal bleeding. Note penile uptake of radiotracer which may be seen normally. IMPRESSION: No abnormal focus of radiotracer uptake to suggest focus of active gastrointestinal bleeding. Electronically Signed   By: Lowella Grip III M.D.   On: 11/06/2020 15:29     Medications:    dialysis solution 1.5% low-MG/low-CA     piperacillin-tazobactam (ZOSYN)  IV      gentamicin cream  1 application Topical Daily   ipratropium-albuterol  3 mL Nebulization Q6H   nicotine  21 mg Transdermal Daily   pantoprazole (PROTONIX) IV  40 mg Intravenous Q12H   acetaminophen, albuterol, dextromethorphan-guaiFENesin, enalaprilat, heparin, morphine injection, ondansetron (ZOFRAN) IV  Assessment/ Plan:  [name] is a 75 y.o.  male past medical history of sCHF, CAD, A-fib, PAD, sleep apnea, diabetes, renal cell  carcinoma, and ESRD on PD. He presents to the ED with bloody stools.   Cycles-3 fills/2L vol fills/ 6 hrs/ 20/10  ESRD on PD Will continue PD schedule while admitted Fluid samples requested from PD catheter Body fluid culture and cell count ordered  2.Anemia of chronic kidney disease  Lab Results  Component Value Date   HGB 9.3 (L) 11/06/2020   Bi monthly EPO Venofer outpatient Will monitor  3. Secondary Hyperparathyroidism: with outpatient labs: PTH 1022, phosphorus 6.4, calcium 9.4 on 10/22/20.   Lab Results  Component Value Date   PTH 315 (H) 05/11/2017   CALCIUM 8.6 (L) 11/06/2020   PHOS 5.5 (H) 08/09/2020   Calcitriol MWF outpatient Sevelamer with meals  4. Hypertension Elevated at 181/63 Home regimen includes Amlodipine, clonidine, and irbesartan. Currently taking enalaprilate   LOS: 0 Jniyah Dantuono 6/16/20224:10 PM

## 2020-11-06 NOTE — ED Provider Notes (Addendum)
[facility] Emergency Department Provider Note  ____________________________________________   Event Date/Time   First MD Initiated Contact with Patient 11/06/20 [phone]     (approximate)  I have reviewed the triage vital signs and the nursing notes.   HISTORY  Chief Complaint Rectal Bleeding    HPI [name] is a 75 y.o. male with history of ESRD, systolic heart failure, peripheral vascular disease, hypertension, lipidemia, stroke, on Plavix, here with rectal bleeding.  The patient states that his symptoms started with increased gas yesterday, followed by grossly bloody bowel movement starting last night and persisting throughout the night.  He has had multiple, greater than 10, bloody bowel movements since then.  He endorses associated lightheadedness and dizziness.  He feels weak.  Denies any associated significant abdominal pain.  No nausea or vomiting.  Denies known history of diverticulosis or diverticulitis.  It has been multiple years since his previous colonoscopy.  He does take Plavix.  No chest pain.  No other acute complaints.    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 12/2018 Echo: EF 40-45%, global HK. Asc Ao 3.7cm; b. 01/2019 Lexi MV: small, mild, fixed basal and mid antlat defect - scar vs artifact. Small, mild mid and apical inf minimally reversible defect, likely scar w/ peri-infarct ischemia. Coronary and Ao atherosclerosis; c. 12/2019 Echo: EF 40-45%, glob HK, Gr1 DD. Nl RV fxn. Sev dil LA. Mild MR. Mild-mod Ao sclerosis w/o stenosis. Asc Ao 52mm.   Carotid arterial disease ([place])    a. 01/2020 RICA 1-39%, RCCA/RECA < 10%, LICA 1-75%, LCCA/LECA <50%.   Claudication ([place])    a. 02/2020 ABI/TBI: R 1.08/0.95, L  1.00/0.70.   Complication of anesthesia    had to be woken up slowly as his bp was elevated when did this quickly   Diabetes mellitus without complication (HCC)    ESRD (end stage renal disease) ([place])    a. Peritoneal Dialysis pt.   Hyperlipidemia    Hypertension    Migraine    cluster   Neuromuscular disorder ([place])    left lower extrem neuropathy   Obstructive sleep apnea    no OSA since had facial surgery with dr. Kathyrn Sheriff in 1997   PAD (peripheral artery disease) [facility]) Feb 2009   nonobstructing, renal angiogram [name]   Renal cell carcinoma 2004   left kidney heminephrectomy   Renal insufficiency    Stroke [facility])    a. 12/2019 MRI: Small acute infarcts involving the left cerebral hemisphere and several chronic infarcts.   TIA (transient ischemic attack)    no residual but left leg and foot still feel heavy   tobacco abuse     Patient Active Problem List   Diagnosis Date Noted   Hypotension, iatrogenic 08/20/2020   ESRD on peritoneal dialysis ([place]) 08/06/2020   HFrEF (heart failure with reduced ejection fraction) (HCC)    AF (paroxysmal atrial fibrillation) ([place])    Anemia of chronic disease    Chronic HFrEF (heart failure with reduced ejection fraction) ([place]) 05/11/2020   Fluid overload 05/11/2020   End stage renal disease ([place]) 02/19/2020   Aortic atherosclerosis ([place]) 01/22/2020   Hospital discharge follow-up 01/22/2020   Urinary retention 01/22/2020   Neurologic deficit as late effect of  ischemic cerebrovascular accident (CVA) 01/15/2020   Dissection of left subclavian artery (HCC)    Cerebrovascular accident (CVA) ([place]) 01/14/2020   Constipation 05/13/2019   Cardiomyopathy ([place]) 04/26/2019   Coronary artery disease involving native coronary artery of native heart without angina pectoris 04/26/2019   ESRD (end stage renal disease) ([place]) 04/26/2019   Chronic pain not due to malignancy 04/12/2019   Chronic bilateral low back pain with bilateral sciatica 11/27/2018    Synovial cyst of lumbar spine 11/27/2018   Uncontrolled hypertension 11/14/2018   CKD (chronic kidney disease), stage V ([place]) 11/14/2018   Major depressive disorder with current active episode 07/26/2017   Anemia of chronic kidney failure, stage 4 (severe) ([place]) 07/26/2017   CKD (chronic kidney disease), stage IV ([place]) 11/25/2016   DOE (dyspnea on exertion) 11/25/2016   Type 2 diabetes mellitus with hyperlipidemia ([place]) 12/02/2014   S/P UVPP (uvulopalatopharyngoplasty) 08/22/2014   Presbyacusis 08/22/2014   Adenocarcinoma, renal cell ([place]) 02/26/2014   Renal cell carcinoma ([place]) 02/26/2014   Malignant neoplasm of kidney excluding renal pelvis ([place]) 02/26/2014   Peripheral arterial occlusive disease ([place]) 02/26/2014   Elevated alkaline phosphatase measurement 07/30/2013   Prostate CA ([place]) 07/30/2013   Prostate cancer ([place]) 07/30/2013   Elevated serum alkaline phosphatase level 07/30/2013   Malignant neoplasm of prostate ([place]) 07/30/2013   Hypertensive renal sclerosis with hypertension 10/18/2012   Renal sclerosis with hypertension 16/02/9603   Complication of diabetes mellitus ([place]) 07/16/2012   PAD (peripheral artery disease) ([place])    Left hip pain 04/20/2012   Erectile dysfunction 04/20/2012   Chronic kidney disease, stage III (moderate) ([place]) 04/20/2012   Hip pain 04/20/2012   TIA (transient ischemic attack) 01/15/2012   Temporary cerebral vascular dysfunction 01/15/2012   Tobacco abuse counseling 05/06/2011   Counseling on substance use and abuse 05/06/2011   Hyperlipidemia    History of renal cell carcinoma     Past Surgical History:  Procedure Laterality Date   CAPD INSERTION N/A 03/01/2019   Procedure: LAPAROSCOPIC INSERTION CONTINUOUS AMBULATORY PERITONEAL DIALYSIS  (CAPD) CATHETER;  Surgeon: Algernon Huxley, MD;  Location: [facility] ORS;  Service: Vascular;  Laterality: N/A;   CARDIAC CATHETERIZATION     Dr. [name] did this to assess his renal artery   cyst removal  12/25/2015    Spine L4 and L5   heminephrectomy  2004   for renal cell CA   RENAL CRYOABLATION  Jan 2006   right kidney,  [name]   sciatica      Prior to Admission medications   Medication Sig Start Date End Date Taking? Authorizing Provider  albuterol (VENTOLIN HFA) 108 (90 Base) MCG/ACT inhaler Inhale 2 puffs into the lungs every 6 (six) hours as needed for wheezing or shortness of breath. 04/12/19   Crecencio Mc, MD  amLODipine (NORVASC) 10 MG tablet TAKE 1 TABLET BY MOUTH ONCE DAILY 06/09/20   Crecencio Mc, MD  aspirin 81 MG tablet Take 1 tablet (81 mg total) by mouth daily. 05/16/14   Crecencio Mc, MD  bisoprolol (ZEBETA) 5 MG tablet Take 1 tablet (5 mg total) by mouth daily. 10/29/20   End, Harrell Gave, MD  calcitRIOL (ROCALTROL) 0.25 MCG capsule Take 0.25 mcg by mouth every Monday, Wednesday, and Friday.    [provider]  cloNIDine (CATAPRES - DOSED IN MG/24 HR) 0.1 mg/24hr patch Place 0.1 mg onto the skin once a week.    [provider]  cloNIDine (CATAPRES) 0.3 MG tablet Take 0.3 mg by mouth  at bedtime.    [provider]  clopidogrel (PLAVIX) 75 MG tablet Take 1 tablet (75 mg total) by mouth daily. 04/02/20   Theora Gianotti, NP  diphenhydrAMINE (BENADRYL) 25 MG tablet Take 25 mg by mouth every 6 (six) hours as needed for allergies.    [provider]  doxazosin (CARDURA) 8 MG tablet Take 8 mg by mouth daily. 03/29/20   [provider]  ergocalciferol (VITAMIN D2) 1.25 MG (50000 UT) capsule Take 50,000 Units by mouth once a week. 09/18/20 09/18/21  [provider]  ezetimibe (ZETIA) 10 MG tablet Take 1 tablet (10 mg total) by mouth daily. 05/09/19 04/02/29  Theora Gianotti, NP  fluticasone (FLONASE) 50 MCG/ACT nasal spray Place 2 sprays into both nostrils daily as needed for allergies or rhinitis.    [provider]  irbesartan (AVAPRO) 300 MG tablet Take 1 tablet (300 mg total) by mouth daily. 08/10/20    Debbe Odea, MD  multivitamin (RENA-VIT) TABS tablet Take 1 tablet by mouth at bedtime. 12/21/19   [provider]  rosuvastatin (CRESTOR) 20 MG tablet Take 1 tablet (20 mg total) by mouth at bedtime. 04/02/20 04/02/21  Theora Gianotti, NP  torsemide (DEMADEX) 20 MG tablet Take 20 mg by mouth 2 (two) times daily. 08/10/20   [provider]    Allergies Irbesartan, Cymbalta [duloxetine hcl], Hydralazine, Imdur [isosorbide nitrate], Atorvastatin, and Bystolic [nebivolol hcl]  Family History  Problem Relation Age of Onset   Hypertension Mother    Cancer Mother        breast   Aneurysm Mother    Coronary artery disease Father    Hypertension Father    Stroke Father 73   Heart disease Father    Heart attack Father 50   Aneurysm Maternal Grandmother        brain   Aneurysm Paternal Grandmother        brain   Coronary artery disease Paternal Grandfather    Heart disease Brother        valvular heart disease   COPD Brother    Hypertension Brother    Stroke Paternal Uncle     Social History Social History   Tobacco Use   Smoking status: Every Day    Packs/day: 1.00    Years: 50.00    Pack years: 50.00    Types: Cigarettes   Smokeless tobacco: Never  Vaping Use   Vaping Use: Former   Devices: tried but did not like  Substance Use Topics   Alcohol use: Not Currently   Drug use: No    Review of Systems  Review of Systems  Constitutional:  Positive for fatigue. Negative for chills and fever.  HENT:  Negative for sore throat.   Respiratory:  Negative for shortness of breath.   Cardiovascular:  Negative for chest pain.  Gastrointestinal:  Positive for blood in stool. Negative for abdominal pain.  Genitourinary:  Negative for flank pain.  Musculoskeletal:  Negative for neck pain.  Skin:  Negative for rash and wound.  Allergic/Immunologic: Negative for immunocompromised state.  Neurological:  Positive for light-headedness. Negative for weakness  and numbness.  Hematological:  Does not bruise/bleed easily.  All other systems reviewed and are negative.   ____________________________________________  PHYSICAL EXAM:      VITAL SIGNS: ED Triage Vitals [11/06/20 0636]  Enc Vitals Group     BP (!) 128/53     Pulse Rate 72     Resp 18  Temp 97.7 F (36.5 C)     Temp Source Oral     SpO2 97 %     Weight 138 lb (62.6 kg)     Height 5\' 6"  (1.676 m)     Head Circumference      Peak Flow      Pain Score      Pain Loc      Pain Edu?      Excl. in [place]?      Physical Exam Vitals and nursing note reviewed.  Constitutional:      General: He is not in acute distress.    Appearance: He is well-developed.  HENT:     Head: Normocephalic and atraumatic.  Eyes:     Conjunctiva/sclera: Conjunctivae normal.  Cardiovascular:     Rate and Rhythm: Normal rate and regular rhythm.     Heart sounds: Normal heart sounds. No murmur heard.   No friction rub.  Pulmonary:     Effort: Pulmonary effort is normal. No respiratory distress.     Breath sounds: Normal breath sounds. No wheezing or rales.  Abdominal:     General: There is no distension.     Palpations: Abdomen is soft.     Tenderness: There is abdominal tenderness (Minimal, diffuse).  Musculoskeletal:     Cervical back: Neck supple.  Skin:    General: Skin is warm.     Capillary Refill: Capillary refill takes less than 2 seconds.  Neurological:     Mental Status: He is alert and oriented to person, place, and time.     Motor: No abnormal muscle tone.      ____________________________________________   LABS (all labs ordered are listed, but only abnormal results are displayed)  Labs Reviewed  CBC WITH DIFFERENTIAL/PLATELET - Abnormal; Notable for the following components:      Result Value   WBC 14.9 (*)    RBC 3.19 (*)    Hemoglobin 9.9 (*)    HCT 28.2 (*)    Neutro Abs 9.5 (*)    Eosinophils Absolute 1.3 (*)    Abs Immature Granulocytes 0.10 (*)    All other  components within normal limits  COMPREHENSIVE METABOLIC PANEL - Abnormal; Notable for the following components:   Glucose, Bld 170 (*)    BUN 61 (*)    Creatinine, Ser 7.51 (*)    Calcium 8.6 (*)    Total Protein 5.8 (*)    Albumin 2.9 (*)    AST 12 (*)    GFR, Estimated 7 (*)    All other components within normal limits  RESP PANEL BY RT-PCR (FLU A&B, COVID) ARPGX2  CULTURE, BLOOD (ROUTINE X 2)  CULTURE, BLOOD (ROUTINE X 2)  LIPASE, BLOOD  PROTIME-INR  APTT  LACTIC ACID, PLASMA  URINALYSIS, COMPLETE (UACMP) WITH MICROSCOPIC  TYPE AND SCREEN    ____________________________________________  EKG: Normal sinus rhythm, ventricular rate 65.  PR 169, QRS 27, QTc 496.  LVH noted with repull.  No acute ST elevations. ________________________________________  RADIOLOGY All imaging, including plain films, CT scans, and ultrasounds, independently reviewed by me, and interpretations confirmed via formal radiology reads.  ED MD interpretation:   CT A/P: Reviewed by me, peritoneal dialysis appears to be in place.  Moderate diverticulosis of the sigmoid: Noted no appreciable stranding.  Moderate stool burden.  Pneumobilia noted  Official radiology report(s): CT ABDOMEN PELVIS WO CONTRAST  Result Date: 11/06/2020 CLINICAL DATA:  [place]. Abdominal pain. GI bleeding. Bleeding began yesterday. EXAM: CT  ABDOMEN AND PELVIS WITHOUT CONTRAST TECHNIQUE: Multidetector CT imaging of the abdomen and pelvis was performed following the standard protocol without IV contrast. COMPARISON:  12/11/2012 FINDINGS: Lower chest: Lung bases are clear. Hepatobiliary: The liver parenchyma appears normal. There is free intraperitoneal air, including some insinuating into the peritoneal spaces of the porta hepatis. I do not think that this is in the biliary tree or portal vein. Gallbladder appears unremarkable. Pancreas: Normal Spleen: Normal Adrenals/Urinary Tract: Adrenal glands are normal. Considerable enlargement of  bilateral renal cysts, some of which are hyperdense. Mass lesion is not suspected, but not completely excluded by this noncontrast CT study. Consider MRI of the abdomen for better evaluation of the renal lesions. There is renal vascular calcification. I cannot rule out the possibility small nonobstructing stones. There is no hydronephrosis or passing stone. No stone in the bladder. Stomach/Bowel: Stomach appears normal. No evident acute small bowel pathology. The appendix appears normal. Large amount of fecal matter within the right colon. Diverticulosis of the left colon but without definable diverticulitis. No evidence of a colon mass or obstruction. Vascular/Lymphatic: Aortic atherosclerosis. Maximal diameter of the infrarenal abdominal aorta measures 2.6 cm. IVC is normal. There is iliac artery ectasia. No retroperitoneal adenopathy. Reproductive: Normal Other: Peritoneal dialysis catheter coiled in the pelvis. No evidence of kink. Intraperitoneal air is present, but a much larger volume than usually associated with peritoneal dialysis. This strongly suggestive bowel perforation. I think most likely thing would be a perforated diverticulum. Consider repeat scan with oral contrast. Musculoskeletal: No significant bone finding. IMPRESSION: Large amount of free intraperitoneal air, much more than usually seen in the setting of peritoneal dialysis. Suspicion of bowel perforation. No definable gastric or small bowel pathology, therefore perforated diverticulum is felt most likely. Multiple low-density lesions of the kidneys, along with some hyperdense lesions. Findings probably relate to renal cysts of varying density, enlarged since the comparison exam. However, mass lesion is not excluded and in this patient with a history of previous renal cell carcinoma on the left, MRI of the kidney should be considered electively to further evaluate. Aortic atherosclerosis. These results were called by telephone at the time of  interpretation on 11/06/2020 at 8:25 am to provider [facility] , who verbally acknowledged these results. Electronically Signed   By: Nelson Chimes M.D.   On: 11/06/2020 08:28    ____________________________________________  PROCEDURES   Procedure(s) performed (including Critical Care):  .Critical Care  Date/Time: 11/06/2020 9:13 AM Performed by: Duffy Bruce, MD Authorized by: Duffy Bruce, MD   Critical care provider statement:    Critical care time (minutes):  35   Critical care time was exclusive of:  Separately billable procedures and treating other patients and teaching time   Critical care was necessary to treat or prevent imminent or life-threatening deterioration of the following conditions:  Cardiac failure, circulatory failure, respiratory failure and sepsis   Critical care was time spent personally by me on the following activities:  Development of treatment plan with patient or surrogate, discussions with consultants, evaluation of patient's response to treatment, examination of patient, obtaining history from patient or surrogate, ordering and performing treatments and interventions, ordering and review of laboratory studies, ordering and review of radiographic studies, pulse oximetry, re-evaluation of patient's condition and review of old charts   I assumed direction of critical care for this patient from another provider in my specialty: no    ____________________________________________  INITIAL IMPRESSION / MDM / [place] / ED COURSE  As part  of my medical decision making, I reviewed the following data within the [place] notes reviewed and incorporated, Old chart reviewed, Notes from prior ED visits, and [place] Controlled Substance [place] was evaluated in Emergency Department on 11/06/2020 for the symptoms described in the history of present illness. He was evaluated in the context of the global  COVID-19 pandemic, which necessitated consideration that the patient might be at risk for infection with the SARS-CoV-2 virus that causes COVID-19. Institutional protocols and algorithms that pertain to the evaluation of patients at risk for COVID-19 are in a state of rapid change based on information released by regulatory bodies including the CDC and federal and state organizations. These policies and algorithms were followed during the patient's care in the ED.  Some ED evaluations and interventions may be delayed as a result of limited staffing during the pandemic.*     Medical Decision Making: 75 year old male here with bright red blood per rectum.  Suspect diverticular bleed versus AVM.  Patient has known peripheral vascular disease, A. fib, CVA, and is on Plavix.  He does have a mild leukocytosis, but denies any significant abdominal pain or signs of peritonitis.  He is, however, on peritoneal dialysis.  Will obtain CT of the abdomen and pelvis to evaluate for underlying infection or inflammation, and I discussed the case with Dr. Marius Ditch who recommends GI bleeding scan.  Patient will be admitted to medicine.  Type and screen sent.  Of note, CT scan c/f possible perforation. Pt has no lactic acidosis or signs of sepsis, btu does have leukocytosis. Discussed with Dr. Christian Mate, who recommended medicine admission. Unlikely perf in setting of PD, and GI bleed is more consistent clinically. Will consult Nephrology as well to see if they can send a sample for fluid analysis.   ____________________________________________  FINAL CLINICAL IMPRESSION(S) / ED DIAGNOSES  Final diagnoses:  Gastrointestinal hemorrhage, unspecified gastrointestinal hemorrhage type  Perforated viscus     MEDICATIONS GIVEN DURING THIS VISIT:  Medications  piperacillin-tazobactam (ZOSYN) IVPB 3.375 g (has no administration in time range)     ED Discharge Orders     None        Note:  This document was prepared  using Dragon voice recognition software and may include unintentional dictation errors.   Duffy Bruce, MD 11/06/20 2423    Duffy Bruce, MD 11/06/20 [phone]

## 2020-11-06 NOTE — ED Notes (Signed)
Pt return from nm.  Pt alert  Iv in place.  Pt waiting on admission  Family with pt.

## 2020-11-06 NOTE — Progress Notes (Signed)
PHARMACY -  BRIEF ANTIBIOTIC NOTE   Pharmacy has received consult(s) for Zosyn from an ED provider.  The patient's profile has been reviewed for ht/wt/allergies/indication/available labs.    One time order(s) placed for Zosyn 3.375g IV x 1 dose.  Further antibiotics/pharmacy consults should be ordered by admitting physician if indicated.                       Thank you, Pearla Dubonnet 11/06/2020  8:58 AM

## 2020-11-06 NOTE — ED Notes (Signed)
Pt transported to [facility] at this time.  °

## 2020-11-06 NOTE — ED Notes (Signed)
Pt cleaned and dried at this time. Pt has a small BM with maroon colored blood at this time.

## 2020-11-07 DIAGNOSIS — K625 Hemorrhage of anus and rectum: Secondary | ICD-10-CM | POA: Diagnosis not present

## 2020-11-07 DIAGNOSIS — K668 Other specified disorders of peritoneum: Secondary | ICD-10-CM | POA: Diagnosis not present

## 2020-11-07 DIAGNOSIS — N186 End stage renal disease: Secondary | ICD-10-CM | POA: Diagnosis not present

## 2020-11-07 DIAGNOSIS — I739 Peripheral vascular disease, unspecified: Secondary | ICD-10-CM | POA: Diagnosis not present

## 2020-11-07 DIAGNOSIS — N2581 Secondary hyperparathyroidism of renal origin: Secondary | ICD-10-CM | POA: Diagnosis not present

## 2020-11-07 DIAGNOSIS — D631 Anemia in chronic kidney disease: Secondary | ICD-10-CM | POA: Diagnosis not present

## 2020-11-07 DIAGNOSIS — D72829 Elevated white blood cell count, unspecified: Secondary | ICD-10-CM | POA: Diagnosis not present

## 2020-11-07 DIAGNOSIS — Z992 Dependence on renal dialysis: Secondary | ICD-10-CM | POA: Diagnosis not present

## 2020-11-07 DIAGNOSIS — I10 Essential (primary) hypertension: Secondary | ICD-10-CM | POA: Diagnosis not present

## 2020-11-07 DIAGNOSIS — K922 Gastrointestinal hemorrhage, unspecified: Secondary | ICD-10-CM | POA: Diagnosis not present

## 2020-11-07 DIAGNOSIS — Z7901 Long term (current) use of anticoagulants: Secondary | ICD-10-CM | POA: Diagnosis not present

## 2020-11-07 LAB — IRON AND TIBC
Iron: 68 ug/dL (ref 45–182)
Saturation Ratios: 33 % (ref 17.9–39.5)
TIBC: 206 ug/dL — ABNORMAL LOW (ref 250–450)
UIBC: 138 ug/dL

## 2020-11-07 LAB — GLUCOSE, CAPILLARY
Glucose-Capillary: 130 mg/dL — ABNORMAL HIGH (ref 70–99)
Glucose-Capillary: 157 mg/dL — ABNORMAL HIGH (ref 70–99)

## 2020-11-07 LAB — BASIC METABOLIC PANEL
Anion gap: 10 (ref 5–15)
BUN: 60 mg/dL — ABNORMAL HIGH (ref 8–23)
CO2: 24 mmol/L (ref 22–32)
Calcium: 8.2 mg/dL — ABNORMAL LOW (ref 8.9–10.3)
Chloride: 106 mmol/L (ref 98–111)
Creatinine, Ser: 7.26 mg/dL — ABNORMAL HIGH (ref 0.61–1.24)
GFR, Estimated: 7 mL/min — ABNORMAL LOW (ref >60)
Glucose, Bld: 159 mg/dL — ABNORMAL HIGH (ref 70–99)
Potassium: 3.5 mmol/L (ref 3.5–5.1)
Sodium: 140 mmol/L (ref 135–145)

## 2020-11-07 LAB — CBC
HCT: 25.2 % — ABNORMAL LOW (ref 39.0–52.0)
Hemoglobin: 9.1 g/dL — ABNORMAL LOW (ref 13.0–17.0)
MCH: 31.7 pg (ref 26.0–34.0)
MCHC: 36.1 g/dL — ABNORMAL HIGH (ref 30.0–36.0)
MCV: 87.8 fL (ref 80.0–100.0)
Platelets: 214 10*3/uL (ref 150–400)
RBC: 2.87 MIL/uL — ABNORMAL LOW (ref 4.22–5.81)
RDW: 14.4 % (ref 11.5–15.5)
WBC: 12.3 10*3/uL — ABNORMAL HIGH (ref 4.0–10.5)
nRBC: 0 % (ref 0.0–0.2)

## 2020-11-07 LAB — FERRITIN: Ferritin: 523 ng/mL — ABNORMAL HIGH (ref 24–336)

## 2020-11-07 LAB — FOLATE: Folate: 38 ng/mL (ref >5.9)

## 2020-11-07 MED ORDER — DOXAZOSIN MESYLATE 4 MG PO TABS
8.0000 mg | ORAL_TABLET | Freq: Every day | ORAL | Status: DC
  Administered 2020-11-07: 8 mg via ORAL
  Filled 2020-11-07: qty 2

## 2020-11-07 MED ORDER — GENTAMICIN SULFATE 0.1 % EX CREA: 1.0000 "application " | TOPICAL_CREAM | Freq: Every day | CUTANEOUS | 0 refills | Status: DC

## 2020-11-07 MED ORDER — ACETAMINOPHEN 325 MG PO TABS: 650.0000 mg | ORAL_TABLET | Freq: Four times a day (QID) | ORAL | Status: DC | PRN

## 2020-11-07 MED ORDER — IPRATROPIUM-ALBUTEROL 0.5-2.5 (3) MG/3ML IN SOLN
3.0000 mL | Freq: Two times a day (BID) | RESPIRATORY_TRACT | Status: DC
  Administered 2020-11-07: 3 mL via RESPIRATORY_TRACT
  Filled 2020-11-07: qty 3

## 2020-11-07 NOTE — Discharge Instructions (Signed)
Continue your peritoneal dialysis as before. If you have any issues regarding it call Dr. Juleen China. Follow-up with G.I. Dr. Marius Ditch for EGD and colonoscopy follow-up cardiology as your routine visit.

## 2020-11-07 NOTE — Progress Notes (Signed)
Discharge information was reviewed with patient and family. All questions answered and discharge paperwork given to patient

## 2020-11-07 NOTE — Progress Notes (Signed)
Dialysis staff at bedside to collect PD sample prior to discharge, per Nephrology order, patient states he is discharging and will not allow for sample to be collected. Family and primary nurse present.

## 2020-11-07 NOTE — Progress Notes (Signed)
[facility]  ROUNDING NOTE   Subjective:   [name] is a 75 y.o. male with past medical history of sCHF, CAD, A-fib, PAD, sleep apnea, diabetes, renal cell carcinoma, and ESRD on PD. He presents to the ED with bloody stools.   Patient underwent peritoneal dialysis overnight. Tolerated well. Pneumoperitoneum noted but appears to have a benign abdomen. PD cultures were ordered however lab did not receive the specimen apparently. We will need to recollect the sample.   Objective:  Vital signs in last 24 hours:  Temp:  [97.8 F (36.6 C)-98.7 F (37.1 C)] 97.8 F (36.6 C) (06/17 1111) Pulse Rate:  [65-80] 78 (06/17 1111) Resp:  [16-20] 18 (06/17 1111) BP: (116-214)/(57-93) 116/60 (06/17 1111) SpO2:  [97 %-100 %] 98 % (06/17 1111)  Weight change:  Filed Weights   11/06/20 0636  Weight: 62.6 kg    Intake/Output: I/O last 3 completed shifts: In: 62 [IV Piggyback:50] Out: 60 [Urine:50]   Intake/Output this shift:  Total I/O In: 240 [P.O.:240] Out: 300 [Urine:300]  Physical Exam: General: NAD  Head: Normocephalic, atraumatic. Moist oral mucosal membranes  Eyes: Anicteric  Lungs:  Clear to auscultation, normal effort  Heart: Regular rate and rhythm  Abdomen:  Soft, nontender, PD catheter  Extremities: no peripheral edema.  Neurologic: Nonfocal, moving all four extremities  Skin: No lesions  Access: PD catheter    Basic Metabolic Panel: Recent Labs  Lab 11/06/20 0651 11/07/20 0522  NA 137 140  K 3.7 3.5  CL 103 106  CO2 23 24  GLUCOSE 170* 159*  BUN 61* 60*  CREATININE 7.51* 7.26*  CALCIUM 8.6* 8.2*     Liver Function Tests: Recent Labs  Lab 11/06/20 0651  AST 12*  ALT 11  ALKPHOS 93  BILITOT 0.8  PROT 5.8*  ALBUMIN 2.9*    Recent Labs  Lab 11/06/20 0651  LIPASE 28    No results for input(s): AMMONIA in the last 168 hours.  CBC: Recent Labs  Lab 11/06/20 0651 11/06/20 1039 11/06/20 1609 11/06/20 2148  11/07/20 0522  WBC 14.9* 12.9* 12.1* 13.6* 12.3*  NEUTROABS 9.5*  --   --   --   --   HGB 9.9* 9.3* 8.8* 8.8* 9.1*  HCT 28.2* 26.1* 25.1* 25.2* 25.2*  MCV 88.4 86.7 88.4 89.4 87.8  PLT 203 204 192 191 214     Cardiac Enzymes: No results for input(s): CKTOTAL, CKMB, CKMBINDEX, TROPONINI in the last 168 hours.  BNP: Invalid input(s): POCBNP  CBG: Recent Labs  Lab 11/07/20 0756 11/07/20 1112  GLUCAP 130* 157*    Microbiology: Results for orders placed or performed during the hospital encounter of 11/06/20  Resp Panel by RT-PCR (Flu A&B, Covid) Nasopharyngeal Swab     Status: None   Collection Time: 11/06/20  8:10 AM   Specimen: Nasopharyngeal Swab; Nasopharyngeal(NP) swabs in vial transport medium  Result Value Ref Range Status   SARS Coronavirus 2 by RT PCR NEGATIVE NEGATIVE Final    Comment: (NOTE) SARS-CoV-2 target nucleic acids are NOT DETECTED.  The SARS-CoV-2 RNA is generally detectable in upper respiratory specimens during the acute phase of infection. The lowest concentration of SARS-CoV-2 viral copies this assay can detect is 138 copies/mL. A negative result does not preclude SARS-Cov-2 infection and should not be used as the sole basis for treatment or other patient management decisions. A negative result may occur with  improper specimen collection/handling, submission of specimen other than nasopharyngeal swab, presence of viral mutation(s)  within the areas targeted by this assay, and inadequate number of viral copies(<138 copies/mL). A negative result must be combined with clinical observations, patient history, and epidemiological information. The expected result is Negative.  Fact Sheet for Patients:  EntrepreneurPulse.com.au  Fact Sheet for Healthcare Providers:  IncredibleEmployment.be  This test is no t yet approved or cleared by the [place] FDA and  has been authorized for detection and/or diagnosis of  SARS-CoV-2 by FDA under an Emergency Use Authorization (EUA). This EUA will remain  in effect (meaning this test can be used) for the duration of the COVID-19 declaration under Section 564(b)(1) of the Act, 21 U.S.C.section 360bbb-3(b)(1), unless the authorization is terminated  or revoked sooner.       Influenza A by PCR NEGATIVE NEGATIVE Final   Influenza B by PCR NEGATIVE NEGATIVE Final    Comment: (NOTE) The Xpert Xpress SARS-CoV-2/FLU/RSV plus assay is intended as an aid in the diagnosis of influenza from Nasopharyngeal swab specimens and should not be used as a sole basis for treatment. Nasal washings and aspirates are unacceptable for Xpert Xpress SARS-CoV-2/FLU/RSV testing.  Fact Sheet for Patients: EntrepreneurPulse.com.au  Fact Sheet for Healthcare Providers: IncredibleEmployment.be  This test is not yet approved or cleared by the [place] FDA and has been authorized for detection and/or diagnosis of SARS-CoV-2 by FDA under an Emergency Use Authorization (EUA). This EUA will remain in effect (meaning this test can be used) for the duration of the COVID-19 declaration under Section 564(b)(1) of the Act, 21 U.S.C. section 360bbb-3(b)(1), unless the authorization is terminated or revoked.  Performed at [facility], [address] [place], [place] [address]   Blood culture (routine x 2)     Status: None (Preliminary result)   Collection Time: 11/06/20  9:49 AM   Specimen: BLOOD  Result Value Ref Range Status   Specimen Description BLOOD LEFT ANTECUBITAL  Final   Special Requests   Final    BOTTLES DRAWN AEROBIC AND ANAEROBIC Blood Culture adequate volume   Culture   Final    NO GROWTH < 24 HOURS Performed at [facility], [address] [place], [place] [address]    Report Status PENDING  Incomplete  Blood culture (routine x 2)     Status: None (Preliminary result)   Collection Time: 11/06/20  9:49 AM    Specimen: BLOOD  Result Value Ref Range Status   Specimen Description BLOOD BLOOD LEFT FOREARM  Final   Special Requests   Final    BOTTLES DRAWN AEROBIC AND ANAEROBIC Blood Culture results may not be optimal due to an inadequate volume of blood received in culture bottles   Culture   Final    NO GROWTH < 24 HOURS Performed at [facility], [address] [place], [place] [address]    Report Status PENDING  Incomplete    Coagulation Studies: Recent Labs    11/06/20 0651  LABPROT 14.1  INR 1.1     Urinalysis: Recent Labs    11/06/20 1159  COLORURINE YELLOW*  LABSPEC 1.009  PHURINE 7.0  GLUCOSEU 50*  HGBUR NEGATIVE  BILIRUBINUR NEGATIVE  KETONESUR NEGATIVE  PROTEINUR 100*  NITRITE NEGATIVE  LEUKOCYTESUR NEGATIVE       Imaging: CT ABDOMEN PELVIS WO CONTRAST  Result Date: 11/06/2020 CLINICAL DATA:  Melena. Abdominal pain. GI bleeding. Bleeding began yesterday. EXAM: CT ABDOMEN AND PELVIS WITHOUT CONTRAST TECHNIQUE: Multidetector CT imaging of the abdomen and pelvis was performed following the standard protocol without IV  contrast. COMPARISON:  12/11/2012 FINDINGS: Lower chest: Lung bases are clear. Hepatobiliary: The liver parenchyma appears normal. There is free intraperitoneal air, including some insinuating into the peritoneal spaces of the porta hepatis. I do not think that this is in the biliary tree or portal vein. Gallbladder appears unremarkable. Pancreas: Normal Spleen: Normal Adrenals/Urinary Tract: Adrenal glands are normal. Considerable enlargement of bilateral renal cysts, some of which are hyperdense. Mass lesion is not suspected, but not completely excluded by this noncontrast CT study. Consider MRI of the abdomen for better evaluation of the renal lesions. There is renal vascular calcification. I cannot rule out the possibility small nonobstructing stones. There is no hydronephrosis or passing stone. No stone in the bladder. Stomach/Bowel: Stomach  appears normal. No evident acute small bowel pathology. The appendix appears normal. Large amount of fecal matter within the right colon. Diverticulosis of the left colon but without definable diverticulitis. No evidence of a colon mass or obstruction. Vascular/Lymphatic: Aortic atherosclerosis. Maximal diameter of the infrarenal abdominal aorta measures 2.6 cm. IVC is normal. There is iliac artery ectasia. No retroperitoneal adenopathy. Reproductive: Normal Other: Peritoneal dialysis catheter coiled in the pelvis. No evidence of kink. Intraperitoneal air is present, but a much larger volume than usually associated with peritoneal dialysis. This strongly suggestive bowel perforation. I think most likely thing would be a perforated diverticulum. Consider repeat scan with oral contrast. Musculoskeletal: No significant bone finding. IMPRESSION: Large amount of free intraperitoneal air, much more than usually seen in the setting of peritoneal dialysis. Suspicion of bowel perforation. No definable gastric or small bowel pathology, therefore perforated diverticulum is felt most likely. Multiple low-density lesions of the kidneys, along with some hyperdense lesions. Findings probably relate to renal cysts of varying density, enlarged since the comparison exam. However, mass lesion is not excluded and in this patient with a history of previous renal cell carcinoma on the left, MRI of the kidney should be considered electively to further evaluate. Aortic atherosclerosis. These results were called by telephone at the time of interpretation on 11/06/2020 at 8:25 am to provider [facility] , who verbally acknowledged these results. Electronically Signed   By: Nelson Chimes M.D.   On: 11/06/2020 08:28   NM GI Blood Loss  Result Date: 11/06/2020 CLINICAL DATA:  Bright red blood in rectum 1 day prior EXAM: NUCLEAR MEDICINE GASTROINTESTINAL BLEEDING SCAN TECHNIQUE: Sequential abdominal images were obtained following  intravenous administration of Tc-65m labeled red blood cells. RADIOPHARMACEUTICALS:  20.95 mCi Tc-29m pertechnetate in-vitro labeled red cells. COMPARISON:  CT abdomen and pelvis November 06, 2020 FINDINGS: There is no abnormal focus of radiotracer uptake to suggest focal site of active gastrointestinal bleeding. Note penile uptake of radiotracer which may be seen normally. IMPRESSION: No abnormal focus of radiotracer uptake to suggest focus of active gastrointestinal bleeding. Electronically Signed   By: Lowella Grip III M.D.   On: 11/06/2020 15:29     Medications:    dialysis solution 1.5% low-MG/low-CA      amLODipine  10 mg Oral Daily   ascorbic acid  500 mg Oral Daily   bisoprolol  5 mg Oral Daily   calcitRIOL  0.25 mcg Oral Q M,W,F   cloNIDine  0.1 mg Transdermal Q7 days   cloNIDine  0.2 mg Oral QHS   doxazosin  8 mg Oral Daily   gentamicin cream  1 application Topical Daily   ipratropium-albuterol  3 mL Nebulization BID   irbesartan  300 mg Oral Daily   multivitamin  1  tablet Oral QHS   nicotine  21 mg Transdermal Daily   pantoprazole (PROTONIX) IV  40 mg Intravenous Q12H   rosuvastatin  20 mg Oral QHS   sevelamer carbonate  800 mg Oral TID WC   torsemide  100 mg Oral Daily   acetaminophen, albuterol, dextromethorphan-guaiFENesin, diphenhydrAMINE, enalaprilat, fluticasone, heparin, ondansetron (ZOFRAN) IV  Assessment/ Plan:  [name] is a 75 y.o.  male past medical history of sCHF, CAD, A-fib, PAD, sleep apnea, diabetes, renal cell carcinoma, and ESRD on PD. He presents to the ED with bloody stools.   Cycles-3 fills/2L vol fills/ 6 hrs/ 20/10  ESRD on PD The patient's PD regimen remains 3 fills, 2 L fill volume, and 6 hours of treatment.  He will resume this at home.  Found to have pneumoperitoneum with benign abdomen per surgical evaluation.  He was given prophylactic IV antibiotics with Zosyn however per surgical evaluation not needed further.  PD fluid cell count  with differential and PD fluid culture ordered but laboratory apparently did not receive the specimen.  I requested PD nurse to we collect the specimens.  2.Anemia of chronic kidney disease  Lab Results  Component Value Date   HGB 9.1 (L) 11/07/2020  Continue Epogen as an outpatient and monitoring of CBC.  3. Secondary Hyperparathyroidism: with outpatient labs: PTH 1022, phosphorus 6.4, calcium 9.4 on 10/22/20.   Lab Results  Component Value Date   PTH 315 (H) 05/11/2017   CALCIUM 8.2 (L) 11/07/2020   PHOS 5.5 (H) 08/09/2020  Maintain the patient on calcitriol and Renvela.  May need to consider Sensipar as well.  4. Hypertension Blood pressure currently except low at 116/60. Continue amlodipine, bisoprolol, clonidine, irbesartan   LOS: 1 Caeson Filippi 6/17/20221:41 PM

## 2020-11-07 NOTE — Progress Notes (Signed)
[facility] SURGICAL PROGRESS NOTE (cpt [phone])  Hospital Day(s): 1.   Interval History: Patient seen and examined, no acute events or new complaints overnight. Patient reports he is frustrated because he is not getting his typical PD catheter runs as he would at home. He does endorse some discomfort in his upper abdomen this morning, but he attributes it to his PD catheter. No fever, chills, nausea, emesis. His leukocytosis on admission has improved, down to 12.3K. Hgb remains relatively stable at 9.1. BMP is consistent with ESRD. No further episodes of bloody BMs. He has remained NPO.   Review of Systems:  Constitutional: denies fever, chills  HEENT: denies cough or congestion  Respiratory: denies any shortness of breath  Cardiovascular: denies chest pain or palpitations  Gastrointestinal: + abdominal pain (mild, upper), denied N/V, or diarrhea/and bowel function as per interval history Genitourinary: denies burning with urination or urinary frequency  Vital signs in last 24 hours: [min-max] current  Temp:  [98.4 F (36.9 C)-98.7 F (37.1 C)] 98.5 F (36.9 C) (06/17 0425) Pulse Rate:  [60-80] 80 (06/17 0425) Resp:  [16-27] 16 (06/16 2019) BP: (159-214)/(57-93) 165/67 (06/17 0425) SpO2:  [97 %-100 %] 97 % (06/16 2019)     Height: 5\' 6"  (167.6 cm) Weight: 62.6 kg BMI (Calculated): 22.28   Intake/Output last 2 shifts:  06/16 0701 - 06/17 0700 In: 4 [IV Piggyback:50] Out: 72 [Urine:50]   Physical Exam:  Constitutional: alert, cooperative and no distress  HENT: normocephalic without obvious abnormality  Eyes: PERRL, EOM's grossly intact and symmetric  Respiratory: breathing non-labored at rest  Cardiovascular: regular rate and sinus rhythm  Gastrointestinal: Abdomen is soft, he is more sore in the upper abdomen compared to yesterday, non-distended, no rebound/guarding, certainly without peritonitis. PD catheter in place.  Musculoskeletal: no edema or wounds, motor  and sensation grossly intact, NT    Labs:  CBC Latest Ref Rng & Units 11/07/2020 11/06/2020 11/06/2020  WBC 4.0 - 10.5 K/uL 12.3(H) 13.6(H) 12.1(H)  Hemoglobin 13.0 - 17.0 g/dL 9.1(L) 8.8(L) 8.8(L)  Hematocrit 39.0 - 52.0 % 25.2(L) 25.2(L) 25.1(L)  Platelets 150 - 400 K/uL 214 191 192   CMP Latest Ref Rng & Units 11/07/2020 11/06/2020 08/09/2020  Glucose 70 - 99 mg/dL 159(H) 170(H) 153(H)  BUN 8 - 23 mg/dL 60(H) 61(H) 38(H)  Creatinine 0.61 - 1.24 mg/dL 7.26(H) 7.51(H) 6.44(H)  Sodium 135 - 145 mmol/L 140 137 138  Potassium 3.5 - 5.1 mmol/L 3.5 3.7 3.5  Chloride 98 - 111 mmol/L 106 103 106  CO2 22 - 32 mmol/L 24 23 25   Calcium 8.9 - 10.3 mg/dL 8.2(L) 8.6(L) 8.0(L)  Total Protein 6.5 - 8.1 g/dL - 5.8(L) -  Total Bilirubin 0.3 - 1.2 mg/dL - 0.8 -  Alkaline Phos 38 - 126 U/L - 93 -  AST 15 - 41 U/L - 12(L) -  ALT 0 - 44 U/L - 11 -     Imaging studies: No new pertinent imaging studies   Assessment/Plan: (ICD-10's: K66.8, K92.2) 75 y.o. male with now resolved bright red blood per rectum incidentally found to have pneumoperitoneum without evidence of sepsis nor peritonitis on examination, likely attributable to PD catheter, complicated by pertinent comorbidities including CVA/PVD on Plavix, ESRD on PD.   - His abdominal examination remains relatively benign, and he endorses that most of his discomfort and complaint is related to the PD Catheter. Suspect pneumoperitoneum is secondary to PD catheter rather than hollow viscus perforation. No need for emergent surgical intervention;  we will follow closely    - Monitor H&H; transfuse as needed   - Appreciate GI input and recommendations   - Monitor abdominal examination - Agree with prophylactic IV Abx (Zosyn) for intra-abdominal coverage - Pain control prn; antiemetics prn - Monitor leukocytosis; improving              - DVT prophylaxis; hold in setting of GI bleeding             - Further management per primary service; we will follow  along  All of the above findings and recommendations were discussed with the patient, and the medical team, and all of patient's questions were answered to his expressed satisfaction.  -- Edison Simon, PA-C [facility] 11/07/2020, 7:08 AM [phone] M-F: 7am - 4pm

## 2020-11-07 NOTE — Discharge Summary (Signed)
[facility] NAME: [name]    MR#:  [id number]  DATE OF BIRTH:  [birth year]  DATE OF ADMISSION:  11/06/2020 ADMITTING PHYSICIAN: Ivor Costa, MD  DATE OF DISCHARGE: 11/07/2020  PRIMARY CARE PHYSICIAN: Crecencio Mc, MD    ADMISSION DIAGNOSIS:  Cough [R05.9] Rectal bleeding [K62.5] Perforated viscus [R19.8] Gastrointestinal hemorrhage, unspecified gastrointestinal hemorrhage type [K92.2]  DISCHARGE DIAGNOSIS:  rectal bleed with acute blood loss anemia suspect diverticular Pneumoperitoneum (on CT scan abdomen )suspected due to PD catheter end-stage renal disease on peritoneal dialysis  SECONDARY DIAGNOSIS:   Past Medical History:  Diagnosis Date   Acute on chronic systolic congestive heart failure ([place])    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 12/2018 Echo: EF 40-45%, global HK. Asc Ao 3.7cm; b. 01/2019 Lexi MV: small, mild, fixed basal and mid antlat defect - scar vs artifact. Small, mild mid and apical inf minimally reversible defect, likely scar w/ peri-infarct ischemia. Coronary and Ao atherosclerosis; c. 12/2019 Echo: EF 40-45%, glob HK, Gr1 DD. Nl RV fxn. Sev dil LA. Mild MR. Mild-mod Ao sclerosis w/o stenosis. Asc Ao 18mm.   Carotid arterial disease ([place])    a. 01/2020 RICA 1-39%, RCCA/RECA < 29%, LICA 5-28%, LCCA/LECA <50%.   Claudication ([place])    a. 02/2020 ABI/TBI: R 1.08/0.95, L 1.00/0.70.   Complication of anesthesia    had to be woken up slowly as his bp was elevated when did this quickly   Diabetes mellitus without complication (HCC)    ESRD (end stage renal disease) ([place])    a. Peritoneal Dialysis pt.   Hyperlipidemia    Hypertension    Migraine    cluster   Neuromuscular disorder ([place])    left lower extrem neuropathy   Obstructive sleep apnea    no OSA since had facial  surgery with dr. Kathyrn Sheriff in 1997   PAD (peripheral artery disease) [facility]) Feb 2009   nonobstructing, renal angiogram [name]   Renal cell carcinoma 2004   left kidney heminephrectomy   Renal insufficiency    Stroke [facility])    a. 12/2019 MRI: Small acute infarcts involving the left cerebral hemisphere and several chronic infarcts.   TIA (transient ischemic attack)    no residual but left leg and foot still feel heavy   tobacco abuse     HOSPITAL COURSE:  [name] is a 75 y.o. male with medical history significant of ESRD on peritoneal dialysis, hypertension, hyperlipidemia, diabetes mellitus, stroke, TIA, depression, tobacco abuse, OSA, migraine headache, carotid artery stenosis, PVD, atrial fibrillation not on anticoagulants, sCHF with EF 40-45%, renal cell carcinoma (s/p of left nephrectomy 2004), prostate cancer, adenocarcinoma of appendix, anemia, who presents with rectal bleeding.  Rectal bleeding and anemia due to blood loss and ESRD: Hgb 10.9 --> 9.9 -->9.3 --> 8.8>>9.1 -- Nuclear medicine bleeding scan is negative.  --Consulted Dr. Marius Ditch of GI-- came from G.I. standpoint to discharge home. Patient prefers to get EGD and colonoscopy as outpatient. Dr. Marius Ditch will arrange it. No further rectal bleeding. -- continue aspirin. Plavix discontinued after discussion with Dr. Rockey Situ - Zofran IV for nausea - Avoid NSAIDs and SQ heparin   Sepsis and pneumoperitoneum:  --CT scan showed a large amount of free air in the peritoneum, suspecting perforated diverticulum.   --General surgeon Dr. Christian Mate is consulted -->  recommend conservative management due to less likely due to viscus perforation, maybe related to peritoneal dialysis catheter.   --Patient meets criteria for sepsis with leukocytosis with WBC 14.9, tachypnea with RR 23, indicating possible interval abdominal infection.  Lactic acid normal.  Currently hemodynamically stable -- improving. -Started Zosyn IV-- patient asymptomatic no  fever white count normal. According to surgery no indication for antibiotic. -Follow-up of blood culture  -- negative    -- PD fluid culture to be followed as outpatient  Hyperlipidemia -Crestor   Cerebrovascular accident (CVA) ([place]) -Crestor -on aspirin   Chronic HFrEF (heart failure with reduced ejection fraction) ([place]): 2D echo on 05/12/2020 showed EF of 40 to 45% with grade 1 diastolic dysfunction.  No acute exacerbation. -Volume management per renal by dialysis -- per Dr. Rockey Situ --continue aspirin. No indication for Plavix.   AF (paroxysmal atrial fibrillation) [facility]): Patient is not taking anticoagulants -Bisoprolol   ESRD on peritoneal dialysis on peritoneal dialysis -Dr. Holley Raring of renal is consulted  -- recommends resume peritoneal dialysis as before. PT fluid culture will be followed as outpatient. Okay to discharge  HTN (hypertension) -Bisoprolol, amlodipine, clonidine patch and clonidine, torsemide, irbesartan -IV hydralazine.   Diet controlled Type II diabetes mellitus with renal manifestations [facility]): Recent A1c 6.3, well controlled, patient not taking medications currently -Check CBG every morning   CAD (coronary artery disease): No chest pain -resume aspirin -Crestor     overall hemodynamically stable. Patient tolerated liquid diet. Offered full liquid to soft however he does not want to eat now. He is in agreement and wants to go home. He is recommended to follow-up with his PCP, cardiology, G.I. and nephrology. Discharge instructions discussed with patient in agreement.             DVT ppx: SCD Code Status: Full code Family Communication: not done, no family member is at bed side.          Disposition Plan:  Anticipate discharge back to previous environment Consults called:  Dr. Holley Raring of renal, general surgeon, Dr. Lucila Maine and Dr. Marius Ditch of GI are consulted. Admission status and Level of care: Progressive Cardiac:    as inpt       Status is:  Inpatient    Dispo: The patient is from: Home              Anticipated d/c is to: Home              Patient currently is medically stable to d/c.  CONSULTS OBTAINED:  Treatment Team:  Brett Bacon, MD Brett Landsman, MD Brett Merritts, MD  DRUG ALLERGIES:   Allergies  Allergen Reactions   Irbesartan Other (See Comments)    hyperkalemia   Cymbalta [Duloxetine Hcl]    Hydralazine Other (See Comments)    headache   Imdur [Isosorbide Nitrate] Other (See Comments)    headache   Atorvastatin Other (See Comments)    Muscle pain   Bystolic [Nebivolol Hcl]     Extreme fatigue     DISCHARGE MEDICATIONS:   Allergies as of 11/07/2020       Reactions   Irbesartan Other (See Comments)   hyperkalemia   Cymbalta [duloxetine Hcl]    Hydralazine Other (See Comments)   headache   Imdur [isosorbide Nitrate] Other (See Comments)   headache   Atorvastatin Other (See Comments)   Muscle pain   Bystolic [nebivolol Hcl]    Extreme fatigue         Medication List  STOP taking these medications    albuterol 108 (90 Base) MCG/ACT inhaler Commonly known as: VENTOLIN HFA   clopidogrel 75 MG tablet Commonly known as: PLAVIX   ezetimibe 10 MG tablet Commonly known as: ZETIA       TAKE these medications    amLODipine 10 MG tablet Commonly known as: NORVASC TAKE 1 TABLET BY MOUTH ONCE DAILY   Ascorbic Acid 500 MG Chew Chew 1 tablet by mouth daily.   aspirin 81 MG tablet Take 1 tablet (81 mg total) by mouth daily.   b complex vitamins capsule Take 1 capsule by mouth daily.   bisoprolol 5 MG tablet Commonly known as: ZEBETA Take 1 tablet (5 mg total) by mouth daily.   calcitRIOL 0.25 MCG capsule Commonly known as: ROCALTROL Take 0.25 mcg by mouth every Monday, Wednesday, and Friday.   cloNIDine 0.1 mg/24hr patch Commonly known as: CATAPRES - Dosed in mg/24 hr Place 0.1 mg onto the skin every Friday.   cloNIDine 0.2 MG tablet Commonly known as:  CATAPRES Take 0.2 mg by mouth at bedtime.   diphenhydrAMINE 25 MG tablet Commonly known as: BENADRYL Take 25 mg by mouth every 6 (six) hours as needed for allergies.   doxazosin 8 MG tablet Commonly known as: CARDURA Take 8 mg by mouth daily.   fluticasone 50 MCG/ACT nasal spray Commonly known as: FLONASE Place 2 sprays into both nostrils daily as needed for allergies or rhinitis.   gentamicin cream 0.1 % Commonly known as: GARAMYCIN Apply 1 application topically daily.   irbesartan 300 MG tablet Commonly known as: AVAPRO Take 1 tablet (300 mg total) by mouth daily.   multivitamin Tabs tablet Take 1 tablet by mouth at bedtime.   rosuvastatin 20 MG tablet Commonly known as: Crestor Take 1 tablet (20 mg total) by mouth at bedtime.   sevelamer 800 MG tablet Commonly known as: RENAGEL Take 800 mg by mouth 3 (three) times daily with meals.   torsemide 100 MG tablet Commonly known as: DEMADEX Take 100 mg by mouth daily.               Discharge Care Instructions  (From admission, onward)           Start     Ordered   11/07/20 0000  Discharge wound care:       Comments: clean skin near exit site with chloraprep swab sticks.  Starting at catheter, use circular pattern around exit site, moving towards outer edges of area covered by dressing.  Apply gentamicin cream to site once daily.  Cover with dry dressing.   11/07/20 1330            If you experience worsening of your admission symptoms, develop shortness of breath, life threatening emergency, suicidal or homicidal thoughts you must seek medical attention immediately by calling 911 or calling your MD immediately  if symptoms less severe.  You Must read complete instructions/literature along with all the possible adverse reactions/side effects for all the Medicines you take and that have been prescribed to you. Take any new Medicines after you have completely understood and accept all the possible adverse  reactions/side effects.   Please note  You were cared for by a hospitalist during your hospital stay. If you have any questions about your discharge medications or the care you received while you were in the hospital after you are discharged, you can call the unit and asked to speak with the hospitalist on call if the hospitalist that  took care of you is not available. Once you are discharged, your primary care physician will handle any further medical issues. Please note that NO REFILLS for any discharge medications will be authorized once you are discharged, as it is imperative that you return to your primary care physician (or establish a relationship with a primary care physician if you do not have one) for your aftercare needs so that they can reassess your need for medications and monitor your lab values. Today   SUBJECTIVE   had some abdominal pain with dialysis. afebrile. Overall feels better. Requesting to go home  VITAL SIGNS:  Blood pressure 116/60, pulse 78, temperature 97.8 F (36.6 C), resp. rate 18, height 5\' 6"  (1.676 m), weight 62.6 kg, SpO2 98 %.  I/O:   Intake/Output Summary (Last 24 hours) at 11/07/2020 1334 Last data filed at 11/07/2020 1115 Gross per 24 hour  Intake 240 ml  Output 350 ml  Net -110 ml    PHYSICAL EXAMINATION:  GENERAL:  75 y.o.-year-old patient lying in the bed with no acute distress. Chronically ill LUNGS: Normal breath sounds bilaterally, no wheezing, rales,rhonchi or crepitation. No use of accessory muscles of respiration.  CARDIOVASCULAR: S1, S2 normal. No murmurs, rubs, or gallops.  ABDOMEN: Soft, non-tender, non-distended. Bowel sounds present. No organomegaly or mass. PD cath+ EXTREMITIES: No pedal edema, cyanosis, or clubbing.  NEUROLOGIC: non focal PSYCHIATRIC: patient is alert and oriented x 3.  SKIN: No obvious rash, lesion, or ulcer.   DATA REVIEW:   CBC  Recent Labs  Lab 11/07/20 0522  WBC 12.3*  HGB 9.1*  HCT 25.2*  PLT  214    Chemistries  Recent Labs  Lab 11/06/20 0651 11/07/20 0522  NA 137 140  K 3.7 3.5  CL 103 106  CO2 23 24  GLUCOSE 170* 159*  BUN 61* 60*  CREATININE 7.51* 7.26*  CALCIUM 8.6* 8.2*  AST 12*  --   ALT 11  --   ALKPHOS 93  --   BILITOT 0.8  --     Microbiology Results   Recent Results (from the past 240 hour(s))  Resp Panel by RT-PCR (Flu A&B, Covid) Nasopharyngeal Swab     Status: None   Collection Time: 11/06/20  8:10 AM   Specimen: Nasopharyngeal Swab; Nasopharyngeal(NP) swabs in vial transport medium  Result Value Ref Range Status   SARS Coronavirus 2 by RT PCR NEGATIVE NEGATIVE Final    Comment: (NOTE) SARS-CoV-2 target nucleic acids are NOT DETECTED.  The SARS-CoV-2 RNA is generally detectable in upper respiratory specimens during the acute phase of infection. The lowest concentration of SARS-CoV-2 viral copies this assay can detect is 138 copies/mL. A negative result does not preclude SARS-Cov-2 infection and should not be used as the sole basis for treatment or other patient management decisions. A negative result may occur with  improper specimen collection/handling, submission of specimen other than nasopharyngeal swab, presence of viral mutation(s) within the areas targeted by this assay, and inadequate number of viral copies(<138 copies/mL). A negative result must be combined with clinical observations, patient history, and epidemiological information. The expected result is Negative.  Fact Sheet for Patients:  EntrepreneurPulse.com.au  Fact Sheet for Healthcare Providers:  IncredibleEmployment.be  This test is no t yet approved or cleared by the [place] FDA and  has been authorized for detection and/or diagnosis of SARS-CoV-2 by FDA under an Emergency Use Authorization (EUA). This EUA will remain  in effect (meaning this test can be used) for the  duration of the COVID-19 declaration under Section  564(b)(1) of the Act, 21 U.S.C.section 360bbb-3(b)(1), unless the authorization is terminated  or revoked sooner.       Influenza A by PCR NEGATIVE NEGATIVE Final   Influenza B by PCR NEGATIVE NEGATIVE Final    Comment: (NOTE) The Xpert Xpress SARS-CoV-2/FLU/RSV plus assay is intended as an aid in the diagnosis of influenza from Nasopharyngeal swab specimens and should not be used as a sole basis for treatment. Nasal washings and aspirates are unacceptable for Xpert Xpress SARS-CoV-2/FLU/RSV testing.  Fact Sheet for Patients: EntrepreneurPulse.com.au  Fact Sheet for Healthcare Providers: IncredibleEmployment.be  This test is not yet approved or cleared by the [place] FDA and has been authorized for detection and/or diagnosis of SARS-CoV-2 by FDA under an Emergency Use Authorization (EUA). This EUA will remain in effect (meaning this test can be used) for the duration of the COVID-19 declaration under Section 564(b)(1) of the Act, 21 U.S.C. section 360bbb-3(b)(1), unless the authorization is terminated or revoked.  Performed at [facility], [address] [place], [place] [address]   Blood culture (routine x 2)     Status: None (Preliminary result)   Collection Time: 11/06/20  9:49 AM   Specimen: BLOOD  Result Value Ref Range Status   Specimen Description BLOOD LEFT ANTECUBITAL  Final   Special Requests   Final    BOTTLES DRAWN AEROBIC AND ANAEROBIC Blood Culture adequate volume   Culture   Final    NO GROWTH < 24 HOURS Performed at [facility], [address], [place], [place] [address]    Report Status PENDING  Incomplete  Blood culture (routine x 2)     Status: None (Preliminary result)   Collection Time: 11/06/20  9:49 AM   Specimen: BLOOD  Result Value Ref Range Status   Specimen Description BLOOD BLOOD LEFT FOREARM  Final   Special Requests   Final    BOTTLES DRAWN AEROBIC AND ANAEROBIC Blood  Culture results may not be optimal due to an inadequate volume of blood received in culture bottles   Culture   Final    NO GROWTH < 24 HOURS Performed at [facility], [address], [place],  [address]    Report Status PENDING  Incomplete    RADIOLOGY:  CT ABDOMEN PELVIS WO CONTRAST  Result Date: 11/06/2020 CLINICAL DATA:  Melena. Abdominal pain. GI bleeding. Bleeding began yesterday. EXAM: CT ABDOMEN AND PELVIS WITHOUT CONTRAST TECHNIQUE: Multidetector CT imaging of the abdomen and pelvis was performed following the standard protocol without IV contrast. COMPARISON:  12/11/2012 FINDINGS: Lower chest: Lung bases are clear. Hepatobiliary: The liver parenchyma appears normal. There is free intraperitoneal air, including some insinuating into the peritoneal spaces of the porta hepatis. I do not think that this is in the biliary tree or portal vein. Gallbladder appears unremarkable. Pancreas: Normal Spleen: Normal Adrenals/Urinary Tract: Adrenal glands are normal. Considerable enlargement of bilateral renal cysts, some of which are hyperdense. Mass lesion is not suspected, but not completely excluded by this noncontrast CT study. Consider MRI of the abdomen for better evaluation of the renal lesions. There is renal vascular calcification. I cannot rule out the possibility small nonobstructing stones. There is no hydronephrosis or passing stone. No stone in the bladder. Stomach/Bowel: Stomach appears normal. No evident acute small bowel pathology. The appendix appears normal. Large amount of fecal matter within the right colon. Diverticulosis of the left colon but without definable diverticulitis. No evidence of a  colon mass or obstruction. Vascular/Lymphatic: Aortic atherosclerosis. Maximal diameter of the infrarenal abdominal aorta measures 2.6 cm. IVC is normal. There is iliac artery ectasia. No retroperitoneal adenopathy. Reproductive: Normal Other: Peritoneal dialysis catheter coiled  in the pelvis. No evidence of kink. Intraperitoneal air is present, but a much larger volume than usually associated with peritoneal dialysis. This strongly suggestive bowel perforation. I think most likely thing would be a perforated diverticulum. Consider repeat scan with oral contrast. Musculoskeletal: No significant bone finding. IMPRESSION: Large amount of free intraperitoneal air, much more than usually seen in the setting of peritoneal dialysis. Suspicion of bowel perforation. No definable gastric or small bowel pathology, therefore perforated diverticulum is felt most likely. Multiple low-density lesions of the kidneys, along with some hyperdense lesions. Findings probably relate to renal cysts of varying density, enlarged since the comparison exam. However, mass lesion is not excluded and in this patient with a history of previous renal cell carcinoma on the left, MRI of the kidney should be considered electively to further evaluate. Aortic atherosclerosis. These results were called by telephone at the time of interpretation on 11/06/2020 at 8:25 am to provider [facility] , who verbally acknowledged these results. Electronically Signed   By: Nelson Chimes M.D.   On: 11/06/2020 08:28   NM GI Blood Loss  Result Date: 11/06/2020 CLINICAL DATA:  Bright red blood in rectum 1 day prior EXAM: NUCLEAR MEDICINE GASTROINTESTINAL BLEEDING SCAN TECHNIQUE: Sequential abdominal images were obtained following intravenous administration of Tc-70m labeled red blood cells. RADIOPHARMACEUTICALS:  20.95 mCi Tc-60m pertechnetate in-vitro labeled red cells. COMPARISON:  CT abdomen and pelvis November 06, 2020 FINDINGS: There is no abnormal focus of radiotracer uptake to suggest focal site of active gastrointestinal bleeding. Note penile uptake of radiotracer which may be seen normally. IMPRESSION: No abnormal focus of radiotracer uptake to suggest focus of active gastrointestinal bleeding. Electronically Signed   By: Lowella Grip III M.D.   On: 11/06/2020 15:29     CODE STATUS:     Code Status Orders  (From admission, onward)           Start     Ordered   11/06/20 2002  Full code  Continuous        11/06/20 2002           Code Status History     Date Active Date Inactive Code Status Order ID Comments User Context   08/06/2020 2031 08/09/2020 2004 Full Code [id number]  Athena Masse, MD ED   05/12/2020 0918 05/14/2020 2016 Full Code [id number]  Ivor Costa, MD ED   01/14/2020 1919 01/15/2020 2022 Full Code [id number]  Clarnce Flock, MD ED      Advance Directive Documentation    Flowsheet Row Most Recent Value  Type of Advance Directive Living will  Pre-existing out of facility DNR order (yellow form or pink MOST form) --  "MOST" Form in Place? --        TOTAL TIME TAKING CARE OF THIS PATIENT: 40 minutes.    Fritzi Mandes M.D  [facility]    CC: Primary care physician; Crecencio Mc, MD

## 2020-11-07 NOTE — Consult Note (Signed)
Pharmacy Antibiotic Note  [name] is a 75 y.o. male w/ h/o CAD/PVD (on plavix (holding)), stroke, DM with ESRD-PD, HLD, HTN, CMP (EF40-45%) now presenting for evaluation of rectal bleeding admitted on 11/06/2020 with c/f  IAI c/b bowel perforation .  Pharmacy has been consulted for Zosyn dosing.  Plan: Day 2 of abx. Continue Zosyn 2.25g q8h ISO ESRD-PD starting at 6/16 1700.  Height: 5\' 6"  (167.6 cm) Weight: 62.6 kg (138 lb) IBW/kg (Calculated) : 63.8  Temp (24hrs), Avg:98.4 F (36.9 C), Min:97.8 F (36.6 C), Max:98.7 F (37.1 C)  Recent Labs  Lab 11/06/20 0651 11/06/20 0810 11/06/20 1039 11/06/20 1609 11/06/20 2148 11/07/20 0522  WBC 14.9*  --  12.9* 12.1* 13.6* 12.3*  CREATININE 7.51*  --   --   --   --  7.26*  LATICACIDVEN  --  1.0  --   --   --   --      Estimated Creatinine Clearance: 7.8 mL/min (A) (by C-G formula based on SCr of 7.26 mg/dL (H)).    Allergies  Allergen Reactions   Irbesartan Other (See Comments)    hyperkalemia   Cymbalta [Duloxetine Hcl]    Hydralazine Other (See Comments)    headache   Imdur [Isosorbide Nitrate] Other (See Comments)    headache   Atorvastatin Other (See Comments)    Muscle pain   Bystolic [Nebivolol Hcl]     Extreme fatigue     Antimicrobials this admission: Zosyn (6/16 >>  Dose adjustments this admission: ESRD-PD dependent  Microbiology results: 6/16 BCx: sent/pending 6/16 Covid/Flu: negative  Thank you for allowing pharmacy to be a part of this patient's care.  [name] 11/07/2020 8:58 AM

## 2020-11-07 NOTE — Progress Notes (Signed)
Brett Darby, MD [address]  [place]  [place], [place] [address]  Main: [phone]  Fax: [phone] Pager: [phone]   Subjective: No acute events overnight, patient did not have further episodes of rectal bleeding.  He has been hemodynamically stable.  Patient did complain of upper abdominal pain after peritoneal dialysis which lasted for several hours.  He reports that ambulation helped to alleviate the pain.  He reports the pain has significantly improved this morning.   Objective: Vital signs in last 24 hours: Vitals:   11/06/20 2019 11/07/20 0425 11/07/20 0755 11/07/20 1111  BP: (!) 159/57 (!) 165/67 (!) 156/68 116/60  Pulse: 69 80 80 78  Resp: 16  17 18   Temp: 98.7 F (37.1 C) 98.5 F (36.9 C) 97.8 F (36.6 C) 97.8 F (36.6 C)  TempSrc:  Oral    SpO2: 97%  100% 98%  Weight:      Height:       Weight change:   Intake/Output Summary (Last 24 hours) at 11/07/2020 1202 Last data filed at 11/07/2020 1115 Gross per 24 hour  Intake 240 ml  Output 350 ml  Net -110 ml     Exam: Heart:: Regular rate and rhythm, S1S2 present, or without murmur or extra heart sounds Lungs: normal and clear to auscultation Abdomen: Mild upper abdominal tenderness   Lab Results: CBC Latest Ref Rng & Units 11/07/2020 11/06/2020 11/06/2020  WBC 4.0 - 10.5 K/uL 12.3(H) 13.6(H) 12.1(H)  Hemoglobin 13.0 - 17.0 g/dL 9.1(L) 8.8(L) 8.8(L)  Hematocrit 39.0 - 52.0 % 25.2(L) 25.2(L) 25.1(L)  Platelets 150 - 400 K/uL 214 191 192   CMP Latest Ref Rng & Units 11/07/2020 11/06/2020 08/09/2020  Glucose 70 - 99 mg/dL 159(H) 170(H) 153(H)  BUN 8 - 23 mg/dL 60(H) 61(H) 38(H)  Creatinine 0.61 - 1.24 mg/dL 7.26(H) 7.51(H) 6.44(H)  Sodium 135 - 145 mmol/L 140 137 138  Potassium 3.5 - 5.1 mmol/L 3.5 3.7 3.5  Chloride 98 - 111 mmol/L 106 103 106  CO2 22 - 32 mmol/L 24 23 25   Calcium 8.9 - 10.3 mg/dL 8.2(L) 8.6(L) 8.0(L)  Total Protein 6.5 - 8.1 g/dL - 5.8(L) -  Total Bilirubin 0.3 - 1.2  mg/dL - 0.8 -  Alkaline Phos 38 - 126 U/L - 93 -  AST 15 - 41 U/L - 12(L) -  ALT 0 - 44 U/L - 11 -    Micro Results: Recent Results (from the past 240 hour(s))  Resp Panel by RT-PCR (Flu A&B, Covid) Nasopharyngeal Swab     Status: None   Collection Time: 11/06/20  8:10 AM   Specimen: Nasopharyngeal Swab; Nasopharyngeal(NP) swabs in vial transport medium  Result Value Ref Range Status   SARS Coronavirus 2 by RT PCR NEGATIVE NEGATIVE Final    Comment: (NOTE) SARS-CoV-2 target nucleic acids are NOT DETECTED.  The SARS-CoV-2 RNA is generally detectable in upper respiratory specimens during the acute phase of infection. The lowest concentration of SARS-CoV-2 viral copies this assay can detect is 138 copies/mL. A negative result does not preclude SARS-Cov-2 infection and should not be used as the sole basis for treatment or other patient management decisions. A negative result may occur with  improper specimen collection/handling, submission of specimen other than nasopharyngeal swab, presence of viral mutation(s) within the areas targeted by this assay, and inadequate number of viral copies(<138 copies/mL). A negative result must be combined with clinical observations, patient history, and epidemiological information. The expected result is Negative.  Fact  Sheet for Patients:  EntrepreneurPulse.com.au  Fact Sheet for Healthcare Providers:  IncredibleEmployment.be  This test is no t yet approved or cleared by the [place] FDA and  has been authorized for detection and/or diagnosis of SARS-CoV-2 by FDA under an Emergency Use Authorization (EUA). This EUA will remain  in effect (meaning this test can be used) for the duration of the COVID-19 declaration under Section 564(b)(1) of the Act, 21 U.S.C.section 360bbb-3(b)(1), unless the authorization is terminated  or revoked sooner.       Influenza A by PCR NEGATIVE NEGATIVE Final   Influenza  B by PCR NEGATIVE NEGATIVE Final    Comment: (NOTE) The Xpert Xpress SARS-CoV-2/FLU/RSV plus assay is intended as an aid in the diagnosis of influenza from Nasopharyngeal swab specimens and should not be used as a sole basis for treatment. Nasal washings and aspirates are unacceptable for Xpert Xpress SARS-CoV-2/FLU/RSV testing.  Fact Sheet for Patients: EntrepreneurPulse.com.au  Fact Sheet for Healthcare Providers: IncredibleEmployment.be  This test is not yet approved or cleared by the [place] FDA and has been authorized for detection and/or diagnosis of SARS-CoV-2 by FDA under an Emergency Use Authorization (EUA). This EUA will remain in effect (meaning this test can be used) for the duration of the COVID-19 declaration under Section 564(b)(1) of the Act, 21 U.S.C. section 360bbb-3(b)(1), unless the authorization is terminated or revoked.  Performed at [facility], [address] [place], [place] [address]   Blood culture (routine x 2)     Status: None (Preliminary result)   Collection Time: 11/06/20  9:49 AM   Specimen: BLOOD  Result Value Ref Range Status   Specimen Description BLOOD LEFT ANTECUBITAL  Final   Special Requests   Final    BOTTLES DRAWN AEROBIC AND ANAEROBIC Blood Culture adequate volume   Culture   Final    NO GROWTH < 24 HOURS Performed at [facility], [address], [place], [place] [address]    Report Status PENDING  Incomplete  Blood culture (routine x 2)     Status: None (Preliminary result)   Collection Time: 11/06/20  9:49 AM   Specimen: BLOOD  Result Value Ref Range Status   Specimen Description BLOOD BLOOD LEFT FOREARM  Final   Special Requests   Final    BOTTLES DRAWN AEROBIC AND ANAEROBIC Blood Culture results may not be optimal due to an inadequate volume of blood received in culture bottles   Culture   Final    NO GROWTH < 24 HOURS Performed at [facility], [address] [place],  [address]    Report Status PENDING  Incomplete   Studies/Results: CT ABDOMEN PELVIS WO CONTRAST  Result Date: 11/06/2020 CLINICAL DATA:  Melena. Abdominal pain. GI bleeding. Bleeding began yesterday. EXAM: CT ABDOMEN AND PELVIS WITHOUT CONTRAST TECHNIQUE: Multidetector CT imaging of the abdomen and pelvis was performed following the standard protocol without IV contrast. COMPARISON:  12/11/2012 FINDINGS: Lower chest: Lung bases are clear. Hepatobiliary: The liver parenchyma appears normal. There is free intraperitoneal air, including some insinuating into the peritoneal spaces of the porta hepatis. I do not think that this is in the biliary tree or portal vein. Gallbladder appears unremarkable. Pancreas: Normal Spleen: Normal Adrenals/Urinary Tract: Adrenal glands are normal. Considerable enlargement of bilateral renal cysts, some of which are hyperdense. Mass lesion is not suspected, but not completely excluded by this noncontrast CT study. Consider MRI of the abdomen for better evaluation of the renal lesions. There is  renal vascular calcification. I cannot rule out the possibility small nonobstructing stones. There is no hydronephrosis or passing stone. No stone in the bladder. Stomach/Bowel: Stomach appears normal. No evident acute small bowel pathology. The appendix appears normal. Large amount of fecal matter within the right colon. Diverticulosis of the left colon but without definable diverticulitis. No evidence of a colon mass or obstruction. Vascular/Lymphatic: Aortic atherosclerosis. Maximal diameter of the infrarenal abdominal aorta measures 2.6 cm. IVC is normal. There is iliac artery ectasia. No retroperitoneal adenopathy. Reproductive: Normal Other: Peritoneal dialysis catheter coiled in the pelvis. No evidence of kink. Intraperitoneal air is present, but a much larger volume than usually associated with peritoneal dialysis. This strongly suggestive bowel  perforation. I think most likely thing would be a perforated diverticulum. Consider repeat scan with oral contrast. Musculoskeletal: No significant bone finding. IMPRESSION: Large amount of free intraperitoneal air, much more than usually seen in the setting of peritoneal dialysis. Suspicion of bowel perforation. No definable gastric or small bowel pathology, therefore perforated diverticulum is felt most likely. Multiple low-density lesions of the kidneys, along with some hyperdense lesions. Findings probably relate to renal cysts of varying density, enlarged since the comparison exam. However, mass lesion is not excluded and in this patient with a history of previous renal cell carcinoma on the left, MRI of the kidney should be considered electively to further evaluate. Aortic atherosclerosis. These results were called by telephone at the time of interpretation on 11/06/2020 at 8:25 am to provider [facility] , who verbally acknowledged these results. Electronically Signed   By: Nelson Chimes M.D.   On: 11/06/2020 08:28   NM GI Blood Loss  Result Date: 11/06/2020 CLINICAL DATA:  Bright red blood in rectum 1 day prior EXAM: NUCLEAR MEDICINE GASTROINTESTINAL BLEEDING SCAN TECHNIQUE: Sequential abdominal images were obtained following intravenous administration of Tc-61m labeled red blood cells. RADIOPHARMACEUTICALS:  20.95 mCi Tc-67m pertechnetate in-vitro labeled red cells. COMPARISON:  CT abdomen and pelvis November 06, 2020 FINDINGS: There is no abnormal focus of radiotracer uptake to suggest focal site of active gastrointestinal bleeding. Note penile uptake of radiotracer which may be seen normally. IMPRESSION: No abnormal focus of radiotracer uptake to suggest focus of active gastrointestinal bleeding. Electronically Signed   By: Lowella Grip III M.D.   On: 11/06/2020 15:29   Medications: I have reviewed the patient's current medications. Prior to Admission:  Facility-Administered Medications Prior to  Admission  Medication Dose Route Frequency Provider Last Rate Last Admin   ipratropium-albuterol (DUONEB) 0.5-2.5 (3) MG/3ML nebulizer solution 3 mL  3 mL Nebulization Q6H Crecencio Mc, MD       Medications Prior to Admission  Medication Sig Dispense Refill Last Dose   amLODipine (NORVASC) 10 MG tablet TAKE 1 TABLET BY MOUTH ONCE DAILY (Patient taking differently: Take 10 mg by mouth daily.) 90 tablet 1 11/05/2020 at Unknown   Ascorbic Acid 500 MG CHEW Chew 1 tablet by mouth daily.      aspirin 81 MG tablet Take 1 tablet (81 mg total) by mouth daily. 30 tablet 11 Unknown at Unknown   b complex vitamins capsule Take 1 capsule by mouth daily.      bisoprolol (ZEBETA) 5 MG tablet Take 1 tablet (5 mg total) by mouth daily. 30 tablet 5 11/05/2020 at 0800   calcitRIOL (ROCALTROL) 0.25 MCG capsule Take 0.25 mcg by mouth every Monday, Wednesday, and Friday.  3    cloNIDine (CATAPRES - DOSED IN MG/24 HR) 0.1 mg/24hr patch Place 0.1  mg onto the skin every Friday.   10/31/2020 at Unknown   cloNIDine (CATAPRES) 0.2 MG tablet Take 0.2 mg by mouth at bedtime.   11/05/2020 at 2100   clopidogrel (PLAVIX) 75 MG tablet Take 1 tablet (75 mg total) by mouth daily. 90 tablet 1 Unknown at Unknown   doxazosin (CARDURA) 8 MG tablet Take 8 mg by mouth daily.   11/05/2020 at Unknown   fluticasone (FLONASE) 50 MCG/ACT nasal spray Place 2 sprays into both nostrils daily as needed for allergies or rhinitis.   Unknown at PRN   multivitamin (RENA-VIT) TABS tablet Take 1 tablet by mouth at bedtime.   11/05/2020 at 2100   rosuvastatin (CRESTOR) 20 MG tablet Take 1 tablet (20 mg total) by mouth at bedtime. 90 tablet 1 Unknown at Unknown   sevelamer (RENAGEL) 800 MG tablet Take 800 mg by mouth 3 (three) times daily with meals.   11/05/2020 at Unknown   torsemide (DEMADEX) 100 MG tablet Take 100 mg by mouth daily.   11/05/2020 at Unknown   albuterol (VENTOLIN HFA) 108 (90 Base) MCG/ACT inhaler Inhale 2 puffs into the lungs every 6  (six) hours as needed for wheezing or shortness of breath. (Patient not taking: Reported on 11/06/2020) 18 g 0 Not Taking   diphenhydrAMINE (BENADRYL) 25 MG tablet Take 25 mg by mouth every 6 (six) hours as needed for allergies.      ezetimibe (ZETIA) 10 MG tablet Take 1 tablet (10 mg total) by mouth daily. (Patient not taking: No sig reported) 90 tablet 3 Not Taking   irbesartan (AVAPRO) 300 MG tablet Take 1 tablet (300 mg total) by mouth daily. 30 tablet 0 Unknown at Unknown   Scheduled:  amLODipine  10 mg Oral Daily   ascorbic acid  500 mg Oral Daily   bisoprolol  5 mg Oral Daily   calcitRIOL  0.25 mcg Oral Q M,W,F   cloNIDine  0.1 mg Transdermal Q7 days   cloNIDine  0.2 mg Oral QHS   doxazosin  8 mg Oral Daily   gentamicin cream  1 application Topical Daily   ipratropium-albuterol  3 mL Nebulization BID   irbesartan  300 mg Oral Daily   multivitamin  1 tablet Oral QHS   nicotine  21 mg Transdermal Daily   pantoprazole (PROTONIX) IV  40 mg Intravenous Q12H   rosuvastatin  20 mg Oral QHS   sevelamer carbonate  800 mg Oral TID WC   torsemide  100 mg Oral Daily   Continuous:  dialysis solution 1.5% low-MG/low-CA     piperacillin-tazobactam (ZOSYN)  IV 2.25 g (11/07/20 0513)   ZLD:[id number], albuterol, dextromethorphan-guaiFENesin, diphenhydrAMINE, enalaprilat, fluticasone, heparin, morphine injection, ondansetron (ZOFRAN) IV Anti-infectives (From admission, onward)    Start     Dose/Rate Route Frequency Ordered Stop   11/06/20 1700  piperacillin-tazobactam (ZOSYN) IVPB 2.25 g        2.25 g 100 mL/hr over 30 Minutes Intravenous Every 8 hours 11/06/20 1005     11/06/20 0900  piperacillin-tazobactam (ZOSYN) IVPB 3.375 g  Status:  Discontinued        3.375 g 12.5 mL/hr over 240 Minutes Intravenous  Once 11/06/20 0857 11/06/20 0859   11/06/20 0900  piperacillin-tazobactam (ZOSYN) IVPB 3.375 g        3.375 g 100 mL/hr over 30 Minutes Intravenous  Once 11/06/20 0859 11/06/20 1037       Scheduled Meds:  amLODipine  10 mg Oral Daily   ascorbic acid  500 mg Oral  Daily   bisoprolol  5 mg Oral Daily   calcitRIOL  0.25 mcg Oral Q M,W,F   cloNIDine  0.1 mg Transdermal Q7 days   cloNIDine  0.2 mg Oral QHS   doxazosin  8 mg Oral Daily   gentamicin cream  1 application Topical Daily   ipratropium-albuterol  3 mL Nebulization BID   irbesartan  300 mg Oral Daily   multivitamin  1 tablet Oral QHS   nicotine  21 mg Transdermal Daily   pantoprazole (PROTONIX) IV  40 mg Intravenous Q12H   rosuvastatin  20 mg Oral QHS   sevelamer carbonate  800 mg Oral TID WC   torsemide  100 mg Oral Daily   Continuous Infusions:  dialysis solution 1.5% low-MG/low-CA     piperacillin-tazobactam (ZOSYN)  IV 2.25 g (11/07/20 0513)   PRN Meds:.acetaminophen, albuterol, dextromethorphan-guaiFENesin, diphenhydrAMINE, enalaprilat, fluticasone, heparin, morphine injection, ondansetron (ZOFRAN) IV   Assessment: Principal Problem:   Rectal bleeding Active Problems:   Hyperlipidemia   Cerebrovascular accident (CVA) ([place])   Chronic HFrEF (heart failure with reduced ejection fraction) (HCC)   AF (paroxysmal atrial fibrillation) (HCC)   ESRD on peritoneal dialysis ([place])   Pneumoperitoneum   HTN (hypertension)   Type II diabetes mellitus with renal manifestations ([place])   CAD (coronary artery disease)   Sepsis ([place])   Anemia in ESRD (end-stage renal disease) ([place])   Anemia due to blood loss   Plan: Rectal bleeding with acute blood loss anemia: No further episodes of rectal bleeding in last 24 hours, hemoglobin is stable at this time Patient is hemodynamically stable, most likely diverticular bleed Nuclear medicine bleeding scan is negative CT of the abdomen revealed large amount of free air in the peritoneum with suspicion for perforated diverticulum.  Patient is evaluated by general surgery, recommend conservative management due to less likely possibility of viscus perforation Last dose  of Plavix on 6/15 AM Today, I have discussed with patient about inpatient colonoscopy on Sunday since he will be off Plavix for 3 days and surgery does not think patient has viscus perforation.  However, patient prefers to undergo colonoscopy as outpatient as he feels weak at this time. Patient does have history of coronary artery disease with CHF, EF of 40 to 45%, recently seen by Dr. Saunders Revel on 6/8 as outpatient who recommended cardiac catheterization.  Patient did not undergo this procedure yet.  Recommend cardiac clearance prior to undergoing colonoscopy.  Patient will be at risk for recurrence of GI bleed on DAPT with unclear source of etiology at this time Okay to start clear liquid diet and advance as tolerated, expect to have dark stool within next 24 hours from old blood Okay to continue aspirin 81 mg daily   LOS: 1 day   Stefano Trulson 11/07/2020, 12:02 PM

## 2020-11-07 NOTE — Care Management CC44 (Signed)
Condition Code 44 Documentation Completed  Patient Details  Name: [name] MRN: [id number] Date of Birth: 03/30/1946   Condition Code 44 given:  Yes Patient signature on Condition Code 44 notice:  Yes Documentation of 2 MD's agreement:  Yes Code 44 added to claim:  Yes    Alberteen Sam, LCSW 11/07/2020, 2:43 PM

## 2020-11-07 NOTE — Care Management Obs Status (Signed)
[place] NOTIFICATION   Patient Details  Name: [name] MRN: [id number] Date of Birth: [birth year]   Medicare Observation Status Notification Given:  Yes    Alberteen Sam, LCSW 11/07/2020, 2:42 PM

## 2020-11-08 LAB — VITAMIN B12: Vitamin B-12: 786 pg/mL (ref 180–914)

## 2020-11-10 ENCOUNTER — Telehealth: Payer: Self-pay | Admitting: Internal Medicine

## 2020-11-10 ENCOUNTER — Telehealth: Payer: Self-pay

## 2020-11-10 NOTE — Telephone Encounter (Signed)
Called and left a message for call back  

## 2020-11-10 NOTE — Telephone Encounter (Signed)
Left message to return call.  Patient does need to keep appointment with Dr Derrel Nip as well as Cardiology and GI.

## 2020-11-10 NOTE — Telephone Encounter (Signed)
-----   Message from Lin Landsman, MD sent at 11/07/2020 12:58 PM EDT ----- Regarding: outpatient colonoscopy [name]  Please schedule outpatient colonoscopy for this patient in the next 2 to 3 weeks Dx: Rectal bleeding Patient is cleared by cardiology as inpatient He will continue aspirin 81 mg daily before the procedure.  He stopped Plavix  Thanks  Cephas Darby, MD [address]  [place]  [place], [place] [address]  Main: [phone]  Fax: [phone] Pager: [phone]

## 2020-11-10 NOTE — Telephone Encounter (Signed)
Patient was in the hospital for Gastrointestinal hemorrhage, unspecified gastrointestinal hemorrhage type  Perforated viscus. Was told to follow up with Dr End in Cardiology.   Please advise

## 2020-11-10 NOTE — Telephone Encounter (Signed)
Pt wife called to schedule a HFU appt.. she wanted to know if they needed this appt or the one with Dr. Saunders Revel

## 2020-11-11 DIAGNOSIS — N2581 Secondary hyperparathyroidism of renal origin: Secondary | ICD-10-CM | POA: Diagnosis not present

## 2020-11-11 DIAGNOSIS — D509 Iron deficiency anemia, unspecified: Secondary | ICD-10-CM | POA: Diagnosis not present

## 2020-11-11 DIAGNOSIS — N186 End stage renal disease: Secondary | ICD-10-CM | POA: Diagnosis not present

## 2020-11-11 DIAGNOSIS — Z992 Dependence on renal dialysis: Secondary | ICD-10-CM | POA: Diagnosis not present

## 2020-11-11 LAB — CULTURE, BLOOD (ROUTINE X 2)
Culture: NO GROWTH
Culture: NO GROWTH
Special Requests: ADEQUATE

## 2020-11-11 NOTE — Telephone Encounter (Signed)
Called and left a message Sent a mychart message

## 2020-11-11 NOTE — Telephone Encounter (Signed)
LMTCB

## 2020-11-12 ENCOUNTER — Telehealth: Payer: Self-pay

## 2020-11-12 DIAGNOSIS — N2581 Secondary hyperparathyroidism of renal origin: Secondary | ICD-10-CM | POA: Diagnosis not present

## 2020-11-12 DIAGNOSIS — N186 End stage renal disease: Secondary | ICD-10-CM | POA: Diagnosis not present

## 2020-11-12 DIAGNOSIS — D509 Iron deficiency anemia, unspecified: Secondary | ICD-10-CM | POA: Diagnosis not present

## 2020-11-12 DIAGNOSIS — Z992 Dependence on renal dialysis: Secondary | ICD-10-CM | POA: Diagnosis not present

## 2020-11-12 NOTE — Telephone Encounter (Signed)
Transition Care Management Unsuccessful Follow-up Telephone Call  Date of discharge and from where:  11/07/20 from [facility]  Attempts:  1st Attempt  Reason for unsuccessful TCM follow-up call:  Unable to reach patient. HFU scheduled 7/1 @ 11:00. Will follow as appropriate.

## 2020-11-13 DIAGNOSIS — Z992 Dependence on renal dialysis: Secondary | ICD-10-CM | POA: Diagnosis not present

## 2020-11-13 DIAGNOSIS — D509 Iron deficiency anemia, unspecified: Secondary | ICD-10-CM | POA: Diagnosis not present

## 2020-11-13 DIAGNOSIS — N186 End stage renal disease: Secondary | ICD-10-CM | POA: Diagnosis not present

## 2020-11-13 DIAGNOSIS — N2581 Secondary hyperparathyroidism of renal origin: Secondary | ICD-10-CM | POA: Diagnosis not present

## 2020-11-13 NOTE — Telephone Encounter (Signed)
Transition Care Management Unsuccessful Follow-up Telephone Call  Date of discharge and from where:  11/07/20 from [facility]  Attempts:  2nd Attempt  Reason for unsuccessful TCM follow-up call:  No answer/busy

## 2020-11-14 DIAGNOSIS — N186 End stage renal disease: Secondary | ICD-10-CM | POA: Diagnosis not present

## 2020-11-14 DIAGNOSIS — N2581 Secondary hyperparathyroidism of renal origin: Secondary | ICD-10-CM | POA: Diagnosis not present

## 2020-11-14 DIAGNOSIS — Z992 Dependence on renal dialysis: Secondary | ICD-10-CM | POA: Diagnosis not present

## 2020-11-14 DIAGNOSIS — D509 Iron deficiency anemia, unspecified: Secondary | ICD-10-CM | POA: Diagnosis not present

## 2020-11-14 NOTE — Telephone Encounter (Signed)
Transition Care Management Follow-up Telephone Call Date of discharge and from where: 11/07/20 from [facility] How have you been since you were released from the hospital? I am doing pretty good, but still weak. Denies fever, pain or seeing blood since returning home; no rectal bleeding. Holding calcitriol 0.25 due to side effect of headache; plans to follow up with Nephrology. Appetite good. Eating more fiber and staying hydrated. Input/output appropriate.  Any questions or concerns? No  Items Reviewed: Did the pt receive and understand the discharge instructions provided? Yes  Medications obtained and verified? Yes  Other? No  Any new allergies since your discharge? No  Dietary orders reviewed? Yes Do you have support at home? Yes   Home Care and Equipment/Supplies: Were home health services ordered? no  Functional Questionnaire: (I = Independent and D = Dependent) ADLs: I  Follow up appointments reviewed:  PCP Hospital f/u appt confirmed? Yes  Scheduled to see Deborra Medina on 11/21/20 @ 11:00. Plans to schedule with Nephrology. Are transportation arrangements needed? No  If their condition worsens, is the pt aware to call PCP or go to the Emergency Dept.? Yes Was the patient provided with contact information for the PCP's office or ED? Yes Was to pt encouraged to call back with questions or concerns? Yes

## 2020-11-15 DIAGNOSIS — D509 Iron deficiency anemia, unspecified: Secondary | ICD-10-CM | POA: Diagnosis not present

## 2020-11-15 DIAGNOSIS — Z992 Dependence on renal dialysis: Secondary | ICD-10-CM | POA: Diagnosis not present

## 2020-11-15 DIAGNOSIS — N2581 Secondary hyperparathyroidism of renal origin: Secondary | ICD-10-CM | POA: Diagnosis not present

## 2020-11-15 DIAGNOSIS — N186 End stage renal disease: Secondary | ICD-10-CM | POA: Diagnosis not present

## 2020-11-16 DIAGNOSIS — Z992 Dependence on renal dialysis: Secondary | ICD-10-CM | POA: Diagnosis not present

## 2020-11-16 DIAGNOSIS — N186 End stage renal disease: Secondary | ICD-10-CM | POA: Diagnosis not present

## 2020-11-16 DIAGNOSIS — D509 Iron deficiency anemia, unspecified: Secondary | ICD-10-CM | POA: Diagnosis not present

## 2020-11-16 DIAGNOSIS — N2581 Secondary hyperparathyroidism of renal origin: Secondary | ICD-10-CM | POA: Diagnosis not present

## 2020-11-17 DIAGNOSIS — Z992 Dependence on renal dialysis: Secondary | ICD-10-CM | POA: Diagnosis not present

## 2020-11-17 DIAGNOSIS — N186 End stage renal disease: Secondary | ICD-10-CM | POA: Diagnosis not present

## 2020-11-17 DIAGNOSIS — N2581 Secondary hyperparathyroidism of renal origin: Secondary | ICD-10-CM | POA: Diagnosis not present

## 2020-11-17 DIAGNOSIS — D509 Iron deficiency anemia, unspecified: Secondary | ICD-10-CM | POA: Diagnosis not present

## 2020-11-18 DIAGNOSIS — N2581 Secondary hyperparathyroidism of renal origin: Secondary | ICD-10-CM | POA: Diagnosis not present

## 2020-11-18 DIAGNOSIS — Z992 Dependence on renal dialysis: Secondary | ICD-10-CM | POA: Diagnosis not present

## 2020-11-18 DIAGNOSIS — D509 Iron deficiency anemia, unspecified: Secondary | ICD-10-CM | POA: Diagnosis not present

## 2020-11-18 DIAGNOSIS — N186 End stage renal disease: Secondary | ICD-10-CM | POA: Diagnosis not present

## 2020-11-19 DIAGNOSIS — N186 End stage renal disease: Secondary | ICD-10-CM | POA: Diagnosis not present

## 2020-11-19 DIAGNOSIS — N2581 Secondary hyperparathyroidism of renal origin: Secondary | ICD-10-CM | POA: Diagnosis not present

## 2020-11-19 DIAGNOSIS — D509 Iron deficiency anemia, unspecified: Secondary | ICD-10-CM | POA: Diagnosis not present

## 2020-11-19 DIAGNOSIS — Z992 Dependence on renal dialysis: Secondary | ICD-10-CM | POA: Diagnosis not present

## 2020-11-20 DIAGNOSIS — N186 End stage renal disease: Secondary | ICD-10-CM | POA: Diagnosis not present

## 2020-11-20 DIAGNOSIS — N2581 Secondary hyperparathyroidism of renal origin: Secondary | ICD-10-CM | POA: Diagnosis not present

## 2020-11-20 DIAGNOSIS — Z992 Dependence on renal dialysis: Secondary | ICD-10-CM | POA: Diagnosis not present

## 2020-11-20 DIAGNOSIS — D509 Iron deficiency anemia, unspecified: Secondary | ICD-10-CM | POA: Diagnosis not present

## 2020-11-21 ENCOUNTER — Ambulatory Visit (INDEPENDENT_AMBULATORY_CARE_PROVIDER_SITE_OTHER): Payer: Medicare Other | Admitting: Internal Medicine

## 2020-11-21 ENCOUNTER — Other Ambulatory Visit: Payer: Self-pay

## 2020-11-21 ENCOUNTER — Encounter: Payer: Self-pay | Admitting: Internal Medicine

## 2020-11-21 ENCOUNTER — Telehealth: Payer: Self-pay | Admitting: *Deleted

## 2020-11-21 VITALS — BP 105/66 | HR 57 | Temp 96.2°F | Resp 15 | Ht 66.0 in | Wt 149.0 lb

## 2020-11-21 VITALS — BP 180/70 | HR 58 | Ht 66.0 in | Wt 149.0 lb

## 2020-11-21 DIAGNOSIS — Z09 Encounter for follow-up examination after completed treatment for conditions other than malignant neoplasm: Secondary | ICD-10-CM | POA: Diagnosis not present

## 2020-11-21 DIAGNOSIS — I5022 Chronic systolic (congestive) heart failure: Secondary | ICD-10-CM

## 2020-11-21 DIAGNOSIS — R5383 Other fatigue: Secondary | ICD-10-CM

## 2020-11-21 DIAGNOSIS — E538 Deficiency of other specified B group vitamins: Secondary | ICD-10-CM | POA: Diagnosis not present

## 2020-11-21 DIAGNOSIS — Z992 Dependence on renal dialysis: Secondary | ICD-10-CM | POA: Diagnosis not present

## 2020-11-21 DIAGNOSIS — N2581 Secondary hyperparathyroidism of renal origin: Secondary | ICD-10-CM | POA: Diagnosis not present

## 2020-11-21 DIAGNOSIS — K922 Gastrointestinal hemorrhage, unspecified: Secondary | ICD-10-CM | POA: Diagnosis not present

## 2020-11-21 DIAGNOSIS — N186 End stage renal disease: Secondary | ICD-10-CM

## 2020-11-21 DIAGNOSIS — D631 Anemia in chronic kidney disease: Secondary | ICD-10-CM | POA: Diagnosis not present

## 2020-11-21 DIAGNOSIS — D638 Anemia in other chronic diseases classified elsewhere: Secondary | ICD-10-CM

## 2020-11-21 DIAGNOSIS — I10 Essential (primary) hypertension: Secondary | ICD-10-CM

## 2020-11-21 DIAGNOSIS — I739 Peripheral vascular disease, unspecified: Secondary | ICD-10-CM

## 2020-11-21 DIAGNOSIS — I700 Atherosclerosis of aorta: Secondary | ICD-10-CM

## 2020-11-21 DIAGNOSIS — N184 Chronic kidney disease, stage 4 (severe): Secondary | ICD-10-CM | POA: Diagnosis not present

## 2020-11-21 DIAGNOSIS — E1122 Type 2 diabetes mellitus with diabetic chronic kidney disease: Secondary | ICD-10-CM | POA: Diagnosis not present

## 2020-11-21 DIAGNOSIS — E559 Vitamin D deficiency, unspecified: Secondary | ICD-10-CM | POA: Diagnosis not present

## 2020-11-21 DIAGNOSIS — Z8673 Personal history of transient ischemic attack (TIA), and cerebral infarction without residual deficits: Secondary | ICD-10-CM

## 2020-11-21 DIAGNOSIS — D509 Iron deficiency anemia, unspecified: Secondary | ICD-10-CM | POA: Diagnosis not present

## 2020-11-21 LAB — CBC WITH DIFFERENTIAL/PLATELET
Basophils Absolute: 0.1 10*3/uL (ref 0.0–0.1)
Basophils Relative: 0.7 % (ref 0.0–3.0)
Eosinophils Absolute: 1.5 10*3/uL — ABNORMAL HIGH (ref 0.0–0.7)
Eosinophils Relative: 11.9 % — ABNORMAL HIGH (ref 0.0–5.0)
HCT: 25.2 % — ABNORMAL LOW (ref 39.0–52.0)
Hemoglobin: 8.7 g/dL — ABNORMAL LOW (ref 13.0–17.0)
Lymphocytes Relative: 16.6 % (ref 12.0–46.0)
Lymphs Abs: 2 10*3/uL (ref 0.7–4.0)
MCHC: 34.5 g/dL (ref 30.0–36.0)
MCV: 90.4 fl (ref 78.0–100.0)
Monocytes Absolute: 0.7 10*3/uL (ref 0.1–1.0)
Monocytes Relative: 5.4 % (ref 3.0–12.0)
Neutro Abs: 8.1 10*3/uL — ABNORMAL HIGH (ref 1.4–7.7)
Neutrophils Relative %: 65.4 % (ref 43.0–77.0)
Platelets: 220 10*3/uL (ref 150.0–400.0)
RBC: 2.79 Mil/uL — ABNORMAL LOW (ref 4.22–5.81)
RDW: 15.3 % (ref 11.5–15.5)
WBC: 12.3 10*3/uL — ABNORMAL HIGH (ref 4.0–10.5)

## 2020-11-21 LAB — HEMOGLOBIN A1C: Hgb A1c MFr Bld: 6.9 % — ABNORMAL HIGH (ref 4.6–6.5)

## 2020-11-21 LAB — COMPREHENSIVE METABOLIC PANEL
ALT: 13 U/L (ref 0–53)
AST: 10 U/L (ref 0–37)
Albumin: 3.4 g/dL — ABNORMAL LOW (ref 3.5–5.2)
Alkaline Phosphatase: 111 U/L (ref 39–117)
BUN: 62 mg/dL — ABNORMAL HIGH (ref 6–23)
CO2: 26 mEq/L (ref 19–32)
Calcium: 8.7 mg/dL (ref 8.4–10.5)
Chloride: 103 mEq/L (ref 96–112)
Creatinine, Ser: 7.17 mg/dL (ref 0.40–1.50)
GFR: 6.95 mL/min — CL (ref >60.00)
Glucose, Bld: 128 mg/dL — ABNORMAL HIGH (ref 70–99)
Potassium: 4.6 mEq/L (ref 3.5–5.1)
Sodium: 139 mEq/L (ref 135–145)
Total Bilirubin: 0.4 mg/dL (ref 0.2–1.2)
Total Protein: 5.8 g/dL — ABNORMAL LOW (ref 6.0–8.3)

## 2020-11-21 LAB — VITAMIN B12: Vitamin B-12: 1154 pg/mL — ABNORMAL HIGH (ref 211–911)

## 2020-11-21 LAB — VITAMIN D 25 HYDROXY (VIT D DEFICIENCY, FRACTURES): VITD: 15.84 ng/mL — ABNORMAL LOW (ref 30.00–100.00)

## 2020-11-21 MED ORDER — TIZANIDINE HCL 2 MG PO TABS: 2.0000 mg | ORAL_TABLET | Freq: Two times a day (BID) | ORAL | 0 refills | Status: DC | PRN

## 2020-11-21 NOTE — Patient Instructions (Signed)
Medication Instructions:   Your physician has recommended you make the following change in your medication:   STOP Bisoprolol   *If you need a refill on your cardiac medications before your next appointment, please call your pharmacy*   Lab Work:  None ordered  Testing/Procedures:  None ordered   Follow-Up: At [facility], you and your health needs are our priority.  As part of our continuing mission to provide you with exceptional heart care, we have created designated Provider Care Teams.  These Care Teams include your primary Cardiologist (physician) and Advanced Practice Providers (APPs -  Physician Assistants and Nurse Practitioners) who all work together to provide you with the care you need, when you need it.  We recommend signing up for the patient portal called "MyChart".  Sign up information is provided on this After Visit Summary.  MyChart is used to connect with patients for Virtual Visits (Telemedicine).  Patients are able to view lab/test results, encounter notes, upcoming appointments, etc.  Non-urgent messages can be sent to your provider as well.   To learn more about what you can do with MyChart, go to [email or web address].    Your next appointment:   6 week(s)  The format for your next appointment:   In Person  Provider:   You may see [name], MD or one of the following Advanced Practice Providers on your designated Care Team:   Lesmeister Hodgkins, NP Christell Faith, PA-C Marrianne Mood, PA-C Cadence [place], [place] Laurann [place], NP

## 2020-11-21 NOTE — Patient Instructions (Addendum)
Trial of tizanidine ,  a muscle relaxer,  do not use more than twice daily   You may increase the dose to 4 mg if needed    Lab Results  Component Value Date   HGBA1C 6.3 (H) 08/06/2020

## 2020-11-21 NOTE — Progress Notes (Signed)
Subjective:  Patient ID: [name], male    DOB: 1945-09-14  Age: 75 y.o. MRN: [id number]  CC: The primary encounter diagnosis was Anemia of chronic disease. Diagnoses of CKD (chronic kidney disease), stage IV ([place]), Hyperparathyroidism due to renal insufficiency ([place]), Fatigue, unspecified type, B12 deficiency, Type 2 diabetes mellitus with stage 4 chronic kidney disease, without long-term current use of insulin ([place]), Anemia in ESRD (end-stage renal disease) ([place]), Aortic atherosclerosis ([place]), Hospital discharge follow-up, Anemia of chronic kidney failure, stage 4 (severe) ([place]), and Vitamin D deficiency were also pertinent to this visit.  HPI [name] presents for hospital follow up   This visit occurred during the SARS-CoV-2 public health emergency.  Safety protocols were in place, including screening questions prior to the visit, additional usage of staff PPE, and extensive cleaning of exam room while observing appropriate contact time as indicated for disinfecting solutions.   [name] is a 75 yr old male with ESKD on peritoneal dialysis,  history of renal cell CA s/p partial  remote left nephrectomy, paroxysmal atrial fibrillation  and ongoing tobacco abuse who was admitted to [facility] on June 16 with lower GI bleed and pneumoperitoneum, presumed from a perforated diverticulum. Caused by the PD catheter.  Seen by GI and surgery,  no surgery or procedures done . He was observed for  24 hours and discharged home on Jun 17 without antibiotics . Hemoglobin was low but stable and he was not transfused. He deferred in patient EGD/colonoscopy, but has not yet scheduled follow up with Dr. Marius Ditch.   Doing PD at home. Was not given abx per surgery.  Blood cultures were negative .  Peritoneal fluid was supposed to have been cultured but was not done  Has appt with [name].   Appetite is improving and he has gained wt since discharge weight of 138. Home readings  have ranged from 140 to 145  after voiding in the morning .  No nausea. However, he continues to feel weak and without energy . He is taking one iron supplement daily along with B12 . He has not been sleeping well since starting peritoneal dialysis  due to the noise it makes  Chronic rhinitis 24/7 Tightness in hips brought on by walking.(  ABIS reviewed and normal. ) Goes to wellness center once a week a to have  spine and hip joints stretched,  which helps,wold like to do it more but it costs $$$. Also gets red light/heat therapy for neuropathy, and TENS Unit.    20 visits using all of these services $5000 .  Reviewed findings of prior CT scan today..  Patient is tolerating high potency statin therapy     Outpatient Medications Prior to Visit  Medication Sig Dispense Refill   amLODipine (NORVASC) 10 MG tablet TAKE 1 TABLET BY MOUTH ONCE DAILY 90 tablet 1   Ascorbic Acid 500 MG CHEW Chew 1 tablet by mouth daily.     aspirin 81 MG tablet Take 1 tablet (81 mg total) by mouth daily. 30 tablet 11   b complex vitamins capsule Take 1 capsule by mouth daily.     calcitRIOL (ROCALTROL) 0.25 MCG capsule Take 0.25 mcg by mouth every Monday, Wednesday, and Friday.  3   cloNIDine (CATAPRES - DOSED IN MG/24 HR) 0.1 mg/24hr patch Place 0.1 mg onto the skin every Friday.     cloNIDine (CATAPRES) 0.2 MG tablet Take 0.2 mg by mouth at bedtime.     diphenhydrAMINE (BENADRYL) 25 MG  tablet Take 25 mg by mouth every 6 (six) hours as needed for allergies.     doxazosin (CARDURA) 8 MG tablet Take 8 mg by mouth daily.     fluticasone (FLONASE) 50 MCG/ACT nasal spray Place 2 sprays into both nostrils daily as needed for allergies or rhinitis.     gentamicin cream (GARAMYCIN) 0.1 % Apply 1 application topically daily. 15 g 0   irbesartan (AVAPRO) 300 MG tablet Take 1 tablet (300 mg total) by mouth daily. 30 tablet 0   multivitamin (RENA-VIT) TABS tablet Take 1 tablet by mouth at bedtime.     rosuvastatin (CRESTOR) 20 MG tablet Take 1 tablet (20 mg  total) by mouth at bedtime. 90 tablet 1   sevelamer (RENAGEL) 800 MG tablet Take 800 mg by mouth 3 (three) times daily with meals.     torsemide (DEMADEX) 100 MG tablet Take 100 mg by mouth daily.     No facility-administered medications prior to visit.    Review of Systems;  Patient denies headache, fevers, malaise, unintentional weight loss, skin rash, eye pain,  sinus pain, sore throat, dysphagia,  hemoptysis ,  dyspnea, wheezing, chest pain, palpitations, orthopnea, edema, abdominal pain, nausea, melena, diarrhea, constipation, flank pain, dysuria, hematuria, urinary  Frequency, nocturia, numbness, tingling, seizures,  Focal weakness, Loss of consciousness,  Tremor, insomnia, depression, anxiety, and suicidal ideation.      Objective:  BP 105/66 (BP Location: Left Arm, Patient Position: Sitting, Cuff Size: Normal)   Pulse (!) 57   Temp (!) 96.2 F (35.7 C) (Temporal)   Resp 15   Ht 5\' 6"  (1.676 m)   Wt 149 lb (67.6 kg)   SpO2 96%   BMI 24.05 kg/m   BP Readings from Last 3 Encounters:  11/21/20 105/66  11/21/20 (!) 180/70  11/07/20 116/60    Wt Readings from Last 3 Encounters:  11/21/20 149 lb (67.6 kg)  11/21/20 149 lb (67.6 kg)  11/06/20 138 lb (62.6 kg)    General appearance: alert, cooperative and appears stated age Ears: normal TM's and external ear canals both ears Throat: lips, mucosa, and tongue normal; teeth and gums normal Neck: no adenopathy, no carotid bruit, supple, symmetrical, trachea midline and thyroid not enlarged, symmetric, no tenderness/mass/nodules Back: symmetric, no curvature. ROM normal. No CVA tenderness. Lungs: clear to auscultation bilaterally Heart: regular rate and rhythm, S1, S2 normal, no murmur, click, rub or gallop Abdomen: soft, non-tender; bowel sounds normal; no masses,  no organomegaly Pulses: 2+ and symmetric Skin: Skin color, texture, turgor normal. No rashes or lesions Lymph nodes: Cervical, supraclavicular, and axillary nodes  normal.  Lab Results  Component Value Date   HGBA1C 6.9 (H) 11/21/2020   HGBA1C 6.3 (H) 08/06/2020   HGBA1C 5.9 (H) 05/12/2020    Lab Results  Component Value Date   CREATININE 7.17 (HH) 11/21/2020   CREATININE 7.26 (H) 11/07/2020   CREATININE 7.51 (H) 11/06/2020    Lab Results  Component Value Date   WBC 12.3 (H) 11/21/2020   HGB 8.7 Repeated and verified X2. (L) 11/21/2020   HCT 25.2 Repeated and verified X2. (L) 11/21/2020   PLT 220.0 11/21/2020   GLUCOSE 128 (H) 11/21/2020   CHOL 122 05/12/2020   TRIG 180 (H) 05/12/2020   HDL 25 (L) 05/12/2020   LDLDIRECT 134.0 05/11/2017   LDLCALC 61 05/12/2020   ALT 13 11/21/2020   AST 10 11/21/2020   NA 139 11/21/2020   K 4.6 11/21/2020   CL 103 11/21/2020  CREATININE 7.17 (HH) 11/21/2020   BUN 62 (H) 11/21/2020   CO2 26 11/21/2020   TSH 1.555 05/11/2020   PSA 9.98 (H) 01/21/2020   INR 1.1 11/06/2020   HGBA1C 6.9 (H) 11/21/2020   MICROALBUR 78.6 (H) 05/11/2017    CT ABDOMEN PELVIS WO CONTRAST  Result Date: 11/06/2020 CLINICAL DATA:  Melena. Abdominal pain. GI bleeding. Bleeding began yesterday. EXAM: CT ABDOMEN AND PELVIS WITHOUT CONTRAST TECHNIQUE: Multidetector CT imaging of the abdomen and pelvis was performed following the standard protocol without IV contrast. COMPARISON:  12/11/2012 FINDINGS: Lower chest: Lung bases are clear. Hepatobiliary: The liver parenchyma appears normal. There is free intraperitoneal air, including some insinuating into the peritoneal spaces of the porta hepatis. I do not think that this is in the biliary tree or portal vein. Gallbladder appears unremarkable. Pancreas: Normal Spleen: Normal Adrenals/Urinary Tract: Adrenal glands are normal. Considerable enlargement of bilateral renal cysts, some of which are hyperdense. Mass lesion is not suspected, but not completely excluded by this noncontrast CT study. Consider MRI of the abdomen for better evaluation of the renal lesions. There is renal vascular  calcification. I cannot rule out the possibility small nonobstructing stones. There is no hydronephrosis or passing stone. No stone in the bladder. Stomach/Bowel: Stomach appears normal. No evident acute small bowel pathology. The appendix appears normal. Large amount of fecal matter within the right colon. Diverticulosis of the left colon but without definable diverticulitis. No evidence of a colon mass or obstruction. Vascular/Lymphatic: Aortic atherosclerosis. Maximal diameter of the infrarenal abdominal aorta measures 2.6 cm. IVC is normal. There is iliac artery ectasia. No retroperitoneal adenopathy. Reproductive: Normal Other: Peritoneal dialysis catheter coiled in the pelvis. No evidence of kink. Intraperitoneal air is present, but a much larger volume than usually associated with peritoneal dialysis. This strongly suggestive bowel perforation. I think most likely thing would be a perforated diverticulum. Consider repeat scan with oral contrast. Musculoskeletal: No significant bone finding. IMPRESSION: Large amount of free intraperitoneal air, much more than usually seen in the setting of peritoneal dialysis. Suspicion of bowel perforation. No definable gastric or small bowel pathology, therefore perforated diverticulum is felt most likely. Multiple low-density lesions of the kidneys, along with some hyperdense lesions. Findings probably relate to renal cysts of varying density, enlarged since the comparison exam. However, mass lesion is not excluded and in this patient with a history of previous renal cell carcinoma on the left, MRI of the kidney should be considered electively to further evaluate. Aortic atherosclerosis. These results were called by telephone at the time of interpretation on 11/06/2020 at 8:25 am to provider [facility] , who verbally acknowledged these results. Electronically Signed   By: Nelson Chimes M.D.   On: 11/06/2020 08:28   NM GI Blood Loss  Result Date: 11/06/2020 CLINICAL  DATA:  Bright red blood in rectum 1 day prior EXAM: NUCLEAR MEDICINE GASTROINTESTINAL BLEEDING SCAN TECHNIQUE: Sequential abdominal images were obtained following intravenous administration of Tc-57m labeled red blood cells. RADIOPHARMACEUTICALS:  20.95 mCi Tc-44m pertechnetate in-vitro labeled red cells. COMPARISON:  CT abdomen and pelvis November 06, 2020 FINDINGS: There is no abnormal focus of radiotracer uptake to suggest focal site of active gastrointestinal bleeding. Note penile uptake of radiotracer which may be seen normally. IMPRESSION: No abnormal focus of radiotracer uptake to suggest focus of active gastrointestinal bleeding. Electronically Signed   By: Lowella Grip III M.D.   On: 11/06/2020 15:29    Assessment & Plan:   Problem List Items Addressed This Visit  Unprioritized   Anemia in ESRD (end-stage renal disease) ([place])    Complicated by recent lower GI bleed   Continue iron supplementation . No significant change since discharge.   Lab Results  Component Value Date   WBC 12.3 (H) 11/21/2020   HGB 8.7 Repeated and verified X2. (L) 11/21/2020   HCT 25.2 Repeated and verified X2. (L) 11/21/2020   MCV 90.4 11/21/2020   PLT 220.0 11/21/2020          Relevant Orders   Iron, TIBC and Ferritin Panel (Completed)   CBC with Differential/Platelet (Completed)   Anemia of chronic disease - Primary   Anemia of chronic kidney failure, stage 4 (severe) (HCC)    Complicated by ower GU bleed,  Irn,  b12 and folate were normal 2 weeks ago..  hgb was 10.9 in March ,  Was 9.1 on admission June 16 and dropped to8.9 during admission,  He was not transfused,  He is takig iron.  NO significant change since discharge   Lab Results  Component Value Date   WBC 12.3 (H) 11/21/2020   HGB 8.7 Repeated and verified X2. (L) 11/21/2020   HCT 25.2 Repeated and verified X2. (L) 11/21/2020   MCV 90.4 11/21/2020   PLT 220.0 11/21/2020            Aortic atherosclerosis ([place])    Noted on  CT during recent admission.  He is taking Zetia and statin   Lab Results  Component Value Date   LDLCALC 61 05/12/2020          RESOLVED: CKD (chronic kidney disease), stage IV ([place])   Relevant Orders   Comprehensive metabolic panel (Completed)   Hospital discharge follow-up    Patient is stable post discharge and has no new issues or questions about discharge plans at the visit today for hospital follow up.  I have reviewed the records from the hospital admission in detail with patient today.       Type II diabetes mellitus with renal manifestations (HCC)    His diabetes remains Controlled without medications secondary to unintentional weight loss .He has had hyperkalemia secondary to an ARB prior to initiation of peritoneal dialysis but is requiring use of ARB now to control hypertension.  He is  tolerating statin Chauncey Cruel for primary prevention of events but has extensive atherosclerosis involving coronaries and cerebrovascular arteries    Lab Results  Component Value Date   HGBA1C 6.9 (H) 11/21/2020   Lab Results  Component Value Date   MICROALBUR 78.6 (H) 05/11/2017   MICROALBUR 63.9 (H) 05/11/2016              Relevant Orders   Hemoglobin A1c (Completed)   Vitamin D deficiency    Starting megadose every 12 days (50K IUS)       Other Visit Diagnoses     Hyperparathyroidism due to renal insufficiency ([place])       Relevant Orders   VITAMIN D 25 Hydroxy (Vit-D Deficiency, Fractures) (Completed)   Fatigue, unspecified type       B12 deficiency       Relevant Orders   Vitamin B12 (Completed)       I am having [name] start on tiZANidine and ergocalciferol. I am also having him maintain his aspirin, diphenhydrAMINE, calcitRIOL, multivitamin, doxazosin, rosuvastatin, amLODipine, irbesartan, torsemide, cloNIDine, cloNIDine, fluticasone, sevelamer, Ascorbic Acid, b complex vitamins, and gentamicin cream.  Meds ordered this encounter  Medications    tiZANidine (ZANAFLEX) 2 MG tablet  Sig: Take 1 tablet (2 mg total) by mouth every 12 (twelve) hours as needed for muscle spasms.    Dispense:  60 tablet    Refill:  0   ergocalciferol (DRISDOL) 1.25 MG (50000 UT) capsule    Sig: Take 1 capsule (50,000 Units total) by mouth every 14 (fourteen) days.    Dispense:  6 capsule    Refill:  0    There are no discontinued medications.  Follow-up: No follow-ups on file.   Crecencio Mc, MD

## 2020-11-21 NOTE — Telephone Encounter (Addendum)
CRITICAL VALUE STICKER  CRITICAL VALUE: Creatinine: 7.17 & GFR-6.94  RECEIVER (on-site recipient of call): Jari Favre, CMA/XT  DATE & TIME NOTIFIED: 3pm  MESSENGER (representative from lab): Hope  MD NOTIFIED: Dr. Derrel Nip  TIME OF NOTIFICATION: 3:05pm  RESPONSE:

## 2020-11-21 NOTE — Progress Notes (Signed)
Follow-up Outpatient Visit Date: 11/21/2020  Primary Care Provider: Crecencio Mc, MD [place] [address]  Chief Complaint: Follow-up hypertension and recent GI bleed  HPI:  [name] is a 75 y.o. male with history of [place]ine-stage renal disease on peritoneal dialysis, cardiomyopathy with an EF of 40-45%, prior abnormal stress test, hypertension, hyperlipidemia, peripheral arterial disease, sleep apnea, renal cell carcinoma status post left heminephrectomy, tobacco abuse, stroke, and adenocarcinoma of the appendix, who presents for follow-up of hypertension and cardiomyopathy.  I last saw him on 10/29/2020, at which time he reported continued fatigue and exertional dyspnea with modest activity.  Blood pressures were poorly controlled, as at prior visits.  We agreed to stop carvedilol and start bisoprolol.  He was hospitalized at [facility] on 11/06/2020 due to rectal bleeding and pneumoperitoneum.  There was initial concern for perforated diverticulum though ultimately it was felt that pneumoperitoneum may be related to his peritoneal dialysis catheter.  He was managed conservatively by internal medicine and surgery.  Clopidogrel was held (previously prescribed by other providers for history of PAD/stroke).  Today, Mr. Poffenberger reports that his almost back to baseline, though he remains fatigued.  It was worse in the setting of his GI bleed.  Fortunately, he has not had any additional rectal bleeding.  He denies chest pain, shortness of breath, palpitations, and edema.  When he first returned home from the hospital, he notes that his blood pressure was quite low, with systolic readings in the 20'U.  This was associated with lightheadedness.  His blood pressure has progressively trended up and was 175/70 at home this morning.  He reports being compliant with his medications, though he has adjusted the times that he takes some of the medications to help determine if any of his  medications could be contributing to his fatigue.  He remains on peritoneal dialysis.  --------------------------------------------------------------------------------------------------  Past Medical History:  Diagnosis Date   Acute on chronic systolic congestive heart failure [facility])    Adenocarcinoma of appendix [facility]) Jan 2006   right kidney, s/p cryoablation   Atrial fibrillation with rapid ventricular response ([employer]) 05/12/2020   Cardiomyopathy ([place])    a. 12/2018 Echo: EF 40-45%, global HK. Asc Ao 3.7cm; b. 01/2019 Lexi MV: small, mild, fixed basal and mid antlat defect - scar vs artifact. Small, mild mid and apical inf minimally reversible defect, likely scar w/ peri-infarct ischemia. Coronary and Ao atherosclerosis; c. 12/2019 Echo: EF 40-45%, glob HK, Gr1 DD. Nl RV fxn. Sev dil LA. Mild MR. Mild-mod Ao sclerosis w/o stenosis. Asc Ao 52mm.   Carotid arterial disease ([place])    a. 01/2020 RICA 1-39%, RCCA/RECA < 54%, LICA 2-70%, LCCA/LECA <50%.   Claudication ([place])    a. 02/2020 ABI/TBI: R 1.08/0.95, L 1.00/0.70.   Complication of anesthesia    had to be woken up slowly as his bp was elevated when did this quickly   Diabetes mellitus without complication (HCC)    ESRD (Jhana Giarratano stage renal disease) ([place])    a. Peritoneal Dialysis pt.   Hyperlipidemia    Hypertension    Migraine    cluster   Neuromuscular disorder ([place])    left lower extrem neuropathy   Obstructive sleep apnea    no OSA since had facial surgery with dr. Kathyrn Sheriff in 1997   PAD (peripheral artery disease) [facility]) Feb 2009   nonobstructing, renal angiogram [name]   Renal cell carcinoma 2004   left kidney heminephrectomy   Renal insufficiency  Stroke [facility])    a. 12/2019 MRI: Small acute infarcts involving the left cerebral hemisphere and several chronic infarcts.   TIA (transient ischemic attack)    no residual but left leg and foot still feel heavy   tobacco abuse    Past Surgical History:  Procedure Laterality Date    CAPD INSERTION N/A 03/01/2019   Procedure: LAPAROSCOPIC INSERTION CONTINUOUS AMBULATORY PERITONEAL DIALYSIS  (CAPD) CATHETER;  Surgeon: Algernon Huxley, MD;  Location: [facility] ORS;  Service: Vascular;  Laterality: N/A;   CARDIAC CATHETERIZATION     Dr. [name] did this to assess his renal artery   cyst removal  12/25/2015   Spine L4 and L5   heminephrectomy  2004   for renal cell CA   RENAL CRYOABLATION  Jan 2006   right kidney,  [name]   sciatica      Current Meds  Medication Sig   amLODipine (NORVASC) 10 MG tablet TAKE 1 TABLET BY MOUTH ONCE DAILY   Ascorbic Acid 500 MG CHEW Chew 1 tablet by mouth daily.   aspirin 81 MG tablet Take 1 tablet (81 mg total) by mouth daily.   b complex vitamins capsule Take 1 capsule by mouth daily.   bisoprolol (ZEBETA) 5 MG tablet Take 1 tablet (5 mg total) by mouth daily.   calcitRIOL (ROCALTROL) 0.25 MCG capsule Take 0.25 mcg by mouth every Monday, Wednesday, and Friday.   cloNIDine (CATAPRES - DOSED IN MG/24 HR) 0.1 mg/24hr patch Place 0.1 mg onto the skin every Friday.   cloNIDine (CATAPRES) 0.2 MG tablet Take 0.2 mg by mouth at bedtime.   diphenhydrAMINE (BENADRYL) 25 MG tablet Take 25 mg by mouth every 6 (six) hours as needed for allergies.   doxazosin (CARDURA) 8 MG tablet Take 8 mg by mouth daily.   fluticasone (FLONASE) 50 MCG/ACT nasal spray Place 2 sprays into both nostrils daily as needed for allergies or rhinitis.   gentamicin cream (GARAMYCIN) 0.1 % Apply 1 application topically daily.   irbesartan (AVAPRO) 300 MG tablet Take 1 tablet (300 mg total) by mouth daily.   multivitamin (RENA-VIT) TABS tablet Take 1 tablet by mouth at bedtime.   rosuvastatin (CRESTOR) 20 MG tablet Take 1 tablet (20 mg total) by mouth at bedtime.   sevelamer (RENAGEL) 800 MG tablet Take 800 mg by mouth 3 (three) times daily with meals.   torsemide (DEMADEX) 100 MG tablet Take 100 mg by mouth daily.    Allergies: Irbesartan, Cymbalta [duloxetine hcl], Hydralazine, Imdur  [isosorbide nitrate], Atorvastatin, and Bystolic [nebivolol hcl]  Social History   Tobacco Use   Smoking status: Every Day    Packs/day: 1.00    Years: 50.00    Pack years: 50.00    Types: Cigarettes   Smokeless tobacco: Never  Vaping Use   Vaping Use: Former   Devices: tried but did not like  Substance Use Topics   Alcohol use: Not Currently   Drug use: No    Family History  Problem Relation Age of Onset   Hypertension Mother    Cancer Mother        breast   Aneurysm Mother    Coronary artery disease Father    Hypertension Father    Stroke Father 75   Heart disease Father    Heart attack Father 36   Aneurysm Maternal Grandmother        brain   Aneurysm Paternal Grandmother        brain   Coronary artery disease Paternal  Grandfather    Heart disease Brother        valvular heart disease   COPD Brother    Hypertension Brother    Stroke Paternal Uncle     Review of Systems: A 12-system review of systems was performed and was negative except as noted in the HPI.  --------------------------------------------------------------------------------------------------  Physical Exam: BP (!) 180/70 (BP Location: Left Arm, Patient Position: Sitting, Cuff Size: Normal)   Pulse (!) 58   Ht 5\' 6"  (1.676 m)   Wt 149 lb (67.6 kg)   SpO2 96%   BMI 24.05 kg/m   General:  NAD. Neck: No JVD or HJR. Lungs: Clear to auscultation bilaterally without wheezes or crackles. Heart: Regular rate and rhythm without murmurs, rubs, or gallops. Abdomen: Soft, nontender, nondistended.  PD catheter in place. Extremities: No lower extremity edema.  Lab Results  Component Value Date   WBC 12.3 (H) 11/07/2020   HGB 9.1 (L) 11/07/2020   HCT 25.2 (L) 11/07/2020   MCV 87.8 11/07/2020   PLT 214 11/07/2020    Lab Results  Component Value Date   NA 140 11/07/2020   K 3.5 11/07/2020   CL 106 11/07/2020   CO2 24 11/07/2020   BUN 60 (H) 11/07/2020   CREATININE 7.26 (H) 11/07/2020    GLUCOSE 159 (H) 11/07/2020   ALT 11 11/06/2020    Lab Results  Component Value Date   CHOL 122 05/12/2020   HDL 25 (L) 05/12/2020   LDLCALC 61 05/12/2020   LDLDIRECT 134.0 05/11/2017   TRIG 180 (H) 05/12/2020   CHOLHDL 4.9 05/12/2020    --------------------------------------------------------------------------------------------------  ASSESSMENT AND PLAN: Chronic HFrEF: Volume status appears stable with continued fatigue but no shortness of breath or edema.  We will continue PD and torsemide under the direction of nephrology.  Prior Myoview in 01/2019 was intermediate risk with several small defects most suggestive of scar without significant ischemia.  Given persistent fatigue, we have previously discussed pursuing cardiac catheterization.  However, in the setting of recent GI bleed and stable symptoms, we have agreed to defer catheterization for now.  Uncontrolled hypertension: Blood pressure remains quite elevated today despite being on multiple agents.  Given fatigue, we will stop bisoprolol.  Mr. Gallon should continue to work on lifestyle modifications.  Renal artery Doppler in 2018 did not show significant renal artery stenosis though calcification of the abdominal aorta in the region of the renal arteries was noted.  I think it would be worthwhile to repeat a renal artery Doppler, as there is some evidence that treatment of renal artery stenosis in the setting of ESRD can help improve refractory hypertension.  If there is no significant RAS, it may be worthwhile exploring the role for renal denervation (investigational therapy) in this setting.  PAD, aortic atherosclerosis, and history of stroke: No active symptoms.  Continue aspirin as long as there is no further hematochezia and hemoglobin remains stable.  I think it is reasonable to continue holding clopidogrel at this time.  Continue rosuvastatin for secondary prevention.  ESRD: Continue PD and torsemide with ongoing nephrology  follow-up.  Avoid nephrotoxic drugs and contrast, if possible, as Mr. Maffei continues to make urine to help manage his volume status.  GI bleed: Exact cause not identified but presumed to be diverticular.  Continue to hold clopidogrel.  Follow-up: Return to clinic in 6 weeks.  [name], MD 11/21/2020 9:33 AM

## 2020-11-22 ENCOUNTER — Encounter: Payer: Self-pay | Admitting: Internal Medicine

## 2020-11-22 DIAGNOSIS — D509 Iron deficiency anemia, unspecified: Secondary | ICD-10-CM | POA: Diagnosis not present

## 2020-11-22 DIAGNOSIS — Z992 Dependence on renal dialysis: Secondary | ICD-10-CM | POA: Diagnosis not present

## 2020-11-22 DIAGNOSIS — D631 Anemia in chronic kidney disease: Secondary | ICD-10-CM | POA: Diagnosis not present

## 2020-11-22 DIAGNOSIS — N186 End stage renal disease: Secondary | ICD-10-CM | POA: Diagnosis not present

## 2020-11-22 DIAGNOSIS — N2581 Secondary hyperparathyroidism of renal origin: Secondary | ICD-10-CM | POA: Diagnosis not present

## 2020-11-22 LAB — IRON,TIBC AND FERRITIN PANEL
%SAT: 33 % (calc) (ref 20–48)
Ferritin: 532 ng/mL — ABNORMAL HIGH (ref 24–380)
Iron: 85 ug/dL (ref 50–180)
TIBC: 261 mcg/dL (calc) (ref 250–425)

## 2020-11-23 ENCOUNTER — Encounter: Payer: Self-pay | Admitting: Internal Medicine

## 2020-11-23 DIAGNOSIS — D631 Anemia in chronic kidney disease: Secondary | ICD-10-CM | POA: Diagnosis not present

## 2020-11-23 DIAGNOSIS — N186 End stage renal disease: Secondary | ICD-10-CM | POA: Diagnosis not present

## 2020-11-23 DIAGNOSIS — Z992 Dependence on renal dialysis: Secondary | ICD-10-CM | POA: Diagnosis not present

## 2020-11-23 DIAGNOSIS — E559 Vitamin D deficiency, unspecified: Secondary | ICD-10-CM | POA: Insufficient documentation

## 2020-11-23 DIAGNOSIS — D509 Iron deficiency anemia, unspecified: Secondary | ICD-10-CM | POA: Diagnosis not present

## 2020-11-23 DIAGNOSIS — N2581 Secondary hyperparathyroidism of renal origin: Secondary | ICD-10-CM | POA: Diagnosis not present

## 2020-11-23 MED ORDER — ERGOCALCIFEROL 1.25 MG (50000 UT) PO CAPS: 50000.0000 [IU] | ORAL_CAPSULE | ORAL | 0 refills | Status: DC

## 2020-11-23 NOTE — Assessment & Plan Note (Addendum)
Complicated by ower GU bleed,  Irn,  b12 and folate were normal 2 weeks ago..  hgb was 10.9 in March ,  Was 9.1 on admission June 16 and dropped to8.9 during admission,  He was not transfused,  He is takig iron.  NO significant change since discharge   Lab Results  Component Value Date   WBC 12.3 (H) 11/21/2020   HGB 8.7 Repeated and verified X2. (L) 11/21/2020   HCT 25.2 Repeated and verified X2. (L) 11/21/2020   MCV 90.4 11/21/2020   PLT 220.0 11/21/2020

## 2020-11-23 NOTE — Assessment & Plan Note (Signed)
Noted on CT during recent admission.  He is taking Zetia and statin   Lab Results  Component Value Date   LDLCALC 61 05/12/2020

## 2020-11-23 NOTE — Assessment & Plan Note (Signed)
Complicated by recent lower GI bleed   Continue iron supplementation . No significant change since discharge.   Lab Results  Component Value Date   WBC 12.3 (H) 11/21/2020   HGB 8.7 Repeated and verified X2. (L) 11/21/2020   HCT 25.2 Repeated and verified X2. (L) 11/21/2020   MCV 90.4 11/21/2020   PLT 220.0 11/21/2020

## 2020-11-23 NOTE — Assessment & Plan Note (Signed)
His diabetes remains Controlled without medications secondary to unintentional weight loss .He has had hyperkalemia secondary to an ARB prior to initiation of peritoneal dialysis but is requiring use of ARB now to control hypertension.  He is  tolerating statin [name] for primary prevention of events but has extensive atherosclerosis involving coronaries and cerebrovascular arteries    Lab Results  Component Value Date   HGBA1C 6.9 (H) 11/21/2020   Lab Results  Component Value Date   MICROALBUR 78.6 (H) 05/11/2017   MICROALBUR 63.9 (H) 05/11/2016

## 2020-11-23 NOTE — Assessment & Plan Note (Signed)
Starting megadose every 12 days (50K IUS)

## 2020-11-23 NOTE — Assessment & Plan Note (Signed)
Patient is stable post discharge and has no new issues or questions about discharge plans at the visit today for hospital follow up.  I have reviewed the records from the hospital admission in detail with patient today. 

## 2020-11-24 DIAGNOSIS — Z992 Dependence on renal dialysis: Secondary | ICD-10-CM | POA: Diagnosis not present

## 2020-11-24 DIAGNOSIS — N2581 Secondary hyperparathyroidism of renal origin: Secondary | ICD-10-CM | POA: Diagnosis not present

## 2020-11-24 DIAGNOSIS — D509 Iron deficiency anemia, unspecified: Secondary | ICD-10-CM | POA: Diagnosis not present

## 2020-11-24 DIAGNOSIS — N186 End stage renal disease: Secondary | ICD-10-CM | POA: Diagnosis not present

## 2020-11-24 DIAGNOSIS — D631 Anemia in chronic kidney disease: Secondary | ICD-10-CM | POA: Diagnosis not present

## 2020-11-25 ENCOUNTER — Telehealth: Payer: Self-pay | Admitting: Internal Medicine

## 2020-11-25 DIAGNOSIS — Z992 Dependence on renal dialysis: Secondary | ICD-10-CM | POA: Diagnosis not present

## 2020-11-25 DIAGNOSIS — D631 Anemia in chronic kidney disease: Secondary | ICD-10-CM | POA: Diagnosis not present

## 2020-11-25 DIAGNOSIS — I5022 Chronic systolic (congestive) heart failure: Secondary | ICD-10-CM

## 2020-11-25 DIAGNOSIS — N2581 Secondary hyperparathyroidism of renal origin: Secondary | ICD-10-CM | POA: Diagnosis not present

## 2020-11-25 DIAGNOSIS — I120 Hypertensive chronic kidney disease with stage 5 chronic kidney disease or end stage renal disease: Secondary | ICD-10-CM

## 2020-11-25 DIAGNOSIS — N186 End stage renal disease: Secondary | ICD-10-CM | POA: Diagnosis not present

## 2020-11-25 DIAGNOSIS — D509 Iron deficiency anemia, unspecified: Secondary | ICD-10-CM | POA: Diagnosis not present

## 2020-11-25 DIAGNOSIS — I739 Peripheral vascular disease, unspecified: Secondary | ICD-10-CM

## 2020-11-25 DIAGNOSIS — N185 Chronic kidney disease, stage 5: Secondary | ICD-10-CM

## 2020-11-25 NOTE — Telephone Encounter (Signed)
Per a CC'ed chart received from Dr. Saunders Revel.   End, Harrell Gave, MD  P Cv Div [facility] Triage Please let Mr. Pilot know that I have researched the role for treating renal artery stenosis in the setting of patients on dialysis and there is some evidence that it can be beneficial in treating resistant hypertension.  I recommend that we arrange for a renal artery Doppler prior to his follow-up visit with [name] in ~6 weeks.  Thanks.   Gerald Stabs

## 2020-11-26 DIAGNOSIS — D509 Iron deficiency anemia, unspecified: Secondary | ICD-10-CM | POA: Diagnosis not present

## 2020-11-26 DIAGNOSIS — D631 Anemia in chronic kidney disease: Secondary | ICD-10-CM | POA: Diagnosis not present

## 2020-11-26 DIAGNOSIS — N2581 Secondary hyperparathyroidism of renal origin: Secondary | ICD-10-CM | POA: Diagnosis not present

## 2020-11-26 DIAGNOSIS — Z992 Dependence on renal dialysis: Secondary | ICD-10-CM | POA: Diagnosis not present

## 2020-11-26 DIAGNOSIS — N186 End stage renal disease: Secondary | ICD-10-CM | POA: Diagnosis not present

## 2020-11-26 NOTE — Telephone Encounter (Signed)
Spoke with patient and reviewed Dr. Darnelle Bos recommendations. He was agreeable with plan and order placed for this test. He did want it prior to his upcoming appointment. Advised that I would have scheduling give him a call to schedule this test. He verbalized understanding with no further questions at this time.

## 2020-11-27 DIAGNOSIS — D631 Anemia in chronic kidney disease: Secondary | ICD-10-CM | POA: Diagnosis not present

## 2020-11-27 DIAGNOSIS — D509 Iron deficiency anemia, unspecified: Secondary | ICD-10-CM | POA: Diagnosis not present

## 2020-11-27 DIAGNOSIS — N2581 Secondary hyperparathyroidism of renal origin: Secondary | ICD-10-CM | POA: Diagnosis not present

## 2020-11-27 DIAGNOSIS — N186 End stage renal disease: Secondary | ICD-10-CM | POA: Diagnosis not present

## 2020-11-27 DIAGNOSIS — Z992 Dependence on renal dialysis: Secondary | ICD-10-CM | POA: Diagnosis not present

## 2020-11-28 ENCOUNTER — Inpatient Hospital Stay
Admission: EM | Admit: 2020-11-28 | Discharge: 2020-11-30 | DRG: 308 | Disposition: A | Discharge status: Home / Self Care | Payer: Medicare Other | Attending: Student | Admitting: Student

## 2020-11-28 ENCOUNTER — Emergency Department: Payer: Medicare Other

## 2020-11-28 ENCOUNTER — Other Ambulatory Visit: Payer: Self-pay

## 2020-11-28 DIAGNOSIS — R531 Weakness: Secondary | ICD-10-CM | POA: Diagnosis not present

## 2020-11-28 DIAGNOSIS — I5022 Chronic systolic (congestive) heart failure: Secondary | ICD-10-CM | POA: Diagnosis present

## 2020-11-28 DIAGNOSIS — R6883 Chills (without fever): Secondary | ICD-10-CM | POA: Diagnosis not present

## 2020-11-28 DIAGNOSIS — J439 Emphysema, unspecified: Secondary | ICD-10-CM | POA: Diagnosis present

## 2020-11-28 DIAGNOSIS — Z79899 Other long term (current) drug therapy: Secondary | ICD-10-CM | POA: Diagnosis not present

## 2020-11-28 DIAGNOSIS — N2581 Secondary hyperparathyroidism of renal origin: Secondary | ICD-10-CM | POA: Diagnosis present

## 2020-11-28 DIAGNOSIS — I10 Essential (primary) hypertension: Secondary | ICD-10-CM | POA: Diagnosis present

## 2020-11-28 DIAGNOSIS — I429 Cardiomyopathy, unspecified: Secondary | ICD-10-CM | POA: Diagnosis present

## 2020-11-28 DIAGNOSIS — D509 Iron deficiency anemia, unspecified: Secondary | ICD-10-CM | POA: Diagnosis not present

## 2020-11-28 DIAGNOSIS — Z7982 Long term (current) use of aspirin: Secondary | ICD-10-CM

## 2020-11-28 DIAGNOSIS — E785 Hyperlipidemia, unspecified: Secondary | ICD-10-CM | POA: Diagnosis present

## 2020-11-28 DIAGNOSIS — N186 End stage renal disease: Secondary | ICD-10-CM | POA: Diagnosis present

## 2020-11-28 DIAGNOSIS — I4891 Unspecified atrial fibrillation: Principal | ICD-10-CM

## 2020-11-28 DIAGNOSIS — Z992 Dependence on renal dialysis: Secondary | ICD-10-CM | POA: Diagnosis not present

## 2020-11-28 DIAGNOSIS — E1122 Type 2 diabetes mellitus with diabetic chronic kidney disease: Secondary | ICD-10-CM | POA: Diagnosis present

## 2020-11-28 DIAGNOSIS — G4733 Obstructive sleep apnea (adult) (pediatric): Secondary | ICD-10-CM | POA: Diagnosis present

## 2020-11-28 DIAGNOSIS — Z85528 Personal history of other malignant neoplasm of kidney: Secondary | ICD-10-CM

## 2020-11-28 DIAGNOSIS — U071 COVID-19: Secondary | ICD-10-CM | POA: Diagnosis present

## 2020-11-28 DIAGNOSIS — D649 Anemia, unspecified: Secondary | ICD-10-CM | POA: Diagnosis present

## 2020-11-28 DIAGNOSIS — F1910 Other psychoactive substance abuse, uncomplicated: Secondary | ICD-10-CM | POA: Diagnosis present

## 2020-11-28 DIAGNOSIS — Z888 Allergy status to other drugs, medicaments and biological substances status: Secondary | ICD-10-CM

## 2020-11-28 DIAGNOSIS — I517 Cardiomegaly: Secondary | ICD-10-CM | POA: Diagnosis not present

## 2020-11-28 DIAGNOSIS — Z823 Family history of stroke: Secondary | ICD-10-CM | POA: Diagnosis not present

## 2020-11-28 DIAGNOSIS — Z825 Family history of asthma and other chronic lower respiratory diseases: Secondary | ICD-10-CM | POA: Diagnosis not present

## 2020-11-28 DIAGNOSIS — F17210 Nicotine dependence, cigarettes, uncomplicated: Secondary | ICD-10-CM | POA: Diagnosis present

## 2020-11-28 DIAGNOSIS — R0902 Hypoxemia: Secondary | ICD-10-CM | POA: Diagnosis not present

## 2020-11-28 DIAGNOSIS — Z8673 Personal history of transient ischemic attack (TIA), and cerebral infarction without residual deficits: Secondary | ICD-10-CM

## 2020-11-28 DIAGNOSIS — I120 Hypertensive chronic kidney disease with stage 5 chronic kidney disease or end stage renal disease: Secondary | ICD-10-CM | POA: Diagnosis not present

## 2020-11-28 DIAGNOSIS — I132 Hypertensive heart and chronic kidney disease with heart failure and with stage 5 chronic kidney disease, or end stage renal disease: Secondary | ICD-10-CM | POA: Diagnosis present

## 2020-11-28 DIAGNOSIS — Z85038 Personal history of other malignant neoplasm of large intestine: Secondary | ICD-10-CM

## 2020-11-28 DIAGNOSIS — R000 Tachycardia, unspecified: Secondary | ICD-10-CM | POA: Diagnosis not present

## 2020-11-28 DIAGNOSIS — D631 Anemia in chronic kidney disease: Secondary | ICD-10-CM | POA: Diagnosis present

## 2020-11-28 DIAGNOSIS — R059 Cough, unspecified: Secondary | ICD-10-CM | POA: Diagnosis not present

## 2020-11-28 DIAGNOSIS — E1151 Type 2 diabetes mellitus with diabetic peripheral angiopathy without gangrene: Secondary | ICD-10-CM | POA: Diagnosis present

## 2020-11-28 DIAGNOSIS — R42 Dizziness and giddiness: Secondary | ICD-10-CM | POA: Diagnosis not present

## 2020-11-28 DIAGNOSIS — Z8249 Family history of ischemic heart disease and other diseases of the circulatory system: Secondary | ICD-10-CM

## 2020-11-28 LAB — CBC
HCT: 27.7 % — ABNORMAL LOW (ref 39.0–52.0)
Hemoglobin: 9.4 g/dL — ABNORMAL LOW (ref 13.0–17.0)
MCH: 31.1 pg (ref 26.0–34.0)
MCHC: 33.9 g/dL (ref 30.0–36.0)
MCV: 91.7 fL (ref 80.0–100.0)
Platelets: 170 10*3/uL (ref 150–400)
RBC: 3.02 MIL/uL — ABNORMAL LOW (ref 4.22–5.81)
RDW: 14.8 % (ref 11.5–15.5)
WBC: 9.8 10*3/uL (ref 4.0–10.5)
nRBC: 0 % (ref 0.0–0.2)

## 2020-11-28 LAB — URINALYSIS, COMPLETE (UACMP) WITH MICROSCOPIC
Bacteria, UA: NONE SEEN
Bilirubin Urine: NEGATIVE
Glucose, UA: 50 mg/dL — AB
Hgb urine dipstick: NEGATIVE
Ketones, ur: NEGATIVE mg/dL
Leukocytes,Ua: NEGATIVE
Nitrite: NEGATIVE
Protein, ur: 30 mg/dL — AB
Specific Gravity, Urine: 1.008 (ref 1.005–1.030)
pH: 7 (ref 5.0–8.0)

## 2020-11-28 LAB — BASIC METABOLIC PANEL
Anion gap: 12 (ref 5–15)
BUN: 76 mg/dL — ABNORMAL HIGH (ref 8–23)
CO2: 21 mmol/L — ABNORMAL LOW (ref 22–32)
Calcium: 7.9 mg/dL — ABNORMAL LOW (ref 8.9–10.3)
Chloride: 105 mmol/L (ref 98–111)
Creatinine, Ser: 7.88 mg/dL — ABNORMAL HIGH (ref 0.61–1.24)
GFR, Estimated: 7 mL/min — ABNORMAL LOW (ref >60)
Glucose, Bld: 126 mg/dL — ABNORMAL HIGH (ref 70–99)
Potassium: 3.5 mmol/L (ref 3.5–5.1)
Sodium: 138 mmol/L (ref 135–145)

## 2020-11-28 LAB — PROCALCITONIN: Procalcitonin: 0.91 ng/mL

## 2020-11-28 LAB — CBG MONITORING, ED: Glucose-Capillary: 102 mg/dL — ABNORMAL HIGH (ref 70–99)

## 2020-11-28 LAB — RESP PANEL BY RT-PCR (FLU A&B, COVID) ARPGX2
Influenza A by PCR: NEGATIVE
Influenza B by PCR: NEGATIVE
SARS Coronavirus 2 by RT PCR: POSITIVE — AB

## 2020-11-28 LAB — LACTIC ACID, PLASMA: Lactic Acid, Venous: 0.8 mmol/L (ref 0.5–1.9)

## 2020-11-28 LAB — GLUCOSE, CAPILLARY: Glucose-Capillary: 144 mg/dL — ABNORMAL HIGH (ref 70–99)

## 2020-11-28 MED ORDER — GENTAMICIN SULFATE 0.1 % EX CREA: 1.0000 "application " | TOPICAL_CREAM | Freq: Every day | CUTANEOUS | Status: DC

## 2020-11-28 MED ORDER — ZINC SULFATE 220 (50 ZN) MG PO CAPS
220.0000 mg | ORAL_CAPSULE | Freq: Every day | ORAL | Status: DC
  Administered 2020-11-28 – 2020-11-30 (×3): 220 mg via ORAL
  Filled 2020-11-28 (×3): qty 1

## 2020-11-28 MED ORDER — FLUTICASONE PROPIONATE 50 MCG/ACT NA SUSP
2.0000 | Freq: Every day | NASAL | Status: DC | PRN
  Administered 2020-11-29: 2 via NASAL
  Filled 2020-11-28 (×2): qty 16

## 2020-11-28 MED ORDER — SEVELAMER CARBONATE 800 MG PO TABS
800.0000 mg | ORAL_TABLET | Freq: Three times a day (TID) | ORAL | Status: DC
  Administered 2020-11-29 – 2020-11-30 (×4): 800 mg via ORAL
  Filled 2020-11-28 (×4): qty 1

## 2020-11-28 MED ORDER — DELFLEX-LC/1.5% DEXTROSE 344 MOSM/L IP SOLN
INTRAPERITONEAL | Status: DC
  Filled 2020-11-28 (×3): qty 3000

## 2020-11-28 MED ORDER — ACETAMINOPHEN 325 MG PO TABS
650.0000 mg | ORAL_TABLET | ORAL | Status: DC | PRN
  Administered 2020-11-29: 650 mg via ORAL
  Filled 2020-11-28: qty 2

## 2020-11-28 MED ORDER — GUAIFENESIN-DM 100-10 MG/5ML PO SYRP
10.0000 mL | ORAL_SOLUTION | ORAL | Status: DC | PRN
  Administered 2020-11-28: 10 mL via ORAL
  Filled 2020-11-28: qty 10

## 2020-11-28 MED ORDER — CALCITRIOL 0.25 MCG PO CAPS
0.2500 ug | ORAL_CAPSULE | ORAL | Status: DC
  Administered 2020-11-28: 0.25 ug via ORAL
  Filled 2020-11-28: qty 1

## 2020-11-28 MED ORDER — ASPIRIN EC 81 MG PO TBEC
81.0000 mg | DELAYED_RELEASE_TABLET | Freq: Every day | ORAL | Status: DC
  Administered 2020-11-29 – 2020-11-30 (×2): 81 mg via ORAL
  Filled 2020-11-28 (×2): qty 1

## 2020-11-28 MED ORDER — ALBUTEROL (5 MG/ML) CONTINUOUS INHALATION SOLN
2.5000 mg | INHALATION_SOLUTION | Freq: Four times a day (QID) | RESPIRATORY_TRACT | Status: DC
  Filled 2020-11-28: qty 20

## 2020-11-28 MED ORDER — ONDANSETRON HCL 4 MG/2ML IJ SOLN: 4.0000 mg | Freq: Four times a day (QID) | INTRAMUSCULAR | Status: DC | PRN

## 2020-11-28 MED ORDER — ASCORBIC ACID 500 MG PO CHEW: 1.0000 | CHEWABLE_TABLET | Freq: Every day | ORAL | Status: DC

## 2020-11-28 MED ORDER — B COMPLEX-C PO TABS
1.0000 | ORAL_TABLET | Freq: Every day | ORAL | Status: DC
  Administered 2020-11-29 – 2020-11-30 (×2): 1 via ORAL
  Filled 2020-11-28 (×2): qty 1

## 2020-11-28 MED ORDER — HEPARIN 1000 UNIT/ML FOR PERITONEAL DIALYSIS
500.0000 [IU] | INTRAMUSCULAR | Status: DC | PRN
  Filled 2020-11-28: qty 0.5

## 2020-11-28 MED ORDER — SODIUM CHLORIDE 0.9 % IV BOLUS
500.0000 mL | Freq: Once | INTRAVENOUS | Status: AC
Start: 2020-11-28 — End: 2020-11-28
  Administered 2020-11-28: 500 mL via INTRAVENOUS

## 2020-11-28 MED ORDER — DILTIAZEM HCL-DEXTROSE 125-5 MG/125ML-% IV SOLN (PREMIX)
5.0000 mg/h | INTRAVENOUS | Status: DC
  Administered 2020-11-28: 5 mg/h via INTRAVENOUS
  Filled 2020-11-28: qty 125

## 2020-11-28 MED ORDER — SODIUM CHLORIDE 0.9 % IV SOLN
2.0000 g | Freq: Once | INTRAVENOUS | Status: AC
  Administered 2020-11-28: 2 g via INTRAVENOUS
  Filled 2020-11-28: qty 20

## 2020-11-28 MED ORDER — RENA-VITE PO TABS
1.0000 | ORAL_TABLET | Freq: Every day | ORAL | Status: DC
  Administered 2020-11-28 – 2020-11-29 (×2): 1 via ORAL
  Filled 2020-11-28 (×3): qty 1

## 2020-11-28 MED ORDER — CLONIDINE HCL 0.1 MG/24HR TD PTWK
0.1000 mg | MEDICATED_PATCH | TRANSDERMAL | Status: DC
  Administered 2020-11-28: 0.1 mg via TRANSDERMAL
  Filled 2020-11-28: qty 1

## 2020-11-28 MED ORDER — GENTAMICIN SULFATE 0.1 % EX CREA
1.0000 "application " | TOPICAL_CREAM | Freq: Every day | CUTANEOUS | Status: DC
  Administered 2020-11-28 – 2020-11-30 (×3): 1 via TOPICAL
  Filled 2020-11-28: qty 15

## 2020-11-28 MED ORDER — SODIUM CHLORIDE 0.9 % IV SOLN: 200.0000 mg | Freq: Once | INTRAVENOUS | Status: DC

## 2020-11-28 MED ORDER — VITAMIN D (ERGOCALCIFEROL) 1.25 MG (50000 UNIT) PO CAPS
50000.0000 [IU] | ORAL_CAPSULE | ORAL | Status: DC
  Administered 2020-11-29: 50000 [IU] via ORAL
  Filled 2020-11-28: qty 1

## 2020-11-28 MED ORDER — SODIUM CHLORIDE 0.9 % IV SOLN
500.0000 mg | Freq: Once | INTRAVENOUS | Status: AC
  Administered 2020-11-28: 500 mg via INTRAVENOUS
  Filled 2020-11-28: qty 500

## 2020-11-28 MED ORDER — ALBUTEROL SULFATE HFA 108 (90 BASE) MCG/ACT IN AERS
1.0000 | INHALATION_SPRAY | Freq: Four times a day (QID) | RESPIRATORY_TRACT | Status: DC
  Administered 2020-11-29: 1 via RESPIRATORY_TRACT
  Filled 2020-11-28: qty 6.7

## 2020-11-28 MED ORDER — ASCORBIC ACID 500 MG PO TABS
500.0000 mg | ORAL_TABLET | Freq: Every day | ORAL | Status: DC
  Administered 2020-11-28 – 2020-11-30 (×3): 500 mg via ORAL
  Filled 2020-11-28 (×4): qty 1

## 2020-11-28 MED ORDER — SODIUM CHLORIDE 0.9 % IV SOLN: 100.0000 mg | Freq: Every day | INTRAVENOUS | Status: DC

## 2020-11-28 MED ORDER — SODIUM CHLORIDE 0.9 % IV SOLN
200.0000 mg | Freq: Once | INTRAVENOUS | Status: AC
  Administered 2020-11-28: 200 mg via INTRAVENOUS
  Filled 2020-11-28: qty 40

## 2020-11-28 MED ORDER — TIZANIDINE HCL 2 MG PO TABS
2.0000 mg | ORAL_TABLET | Freq: Two times a day (BID) | ORAL | Status: DC | PRN
  Filled 2020-11-28: qty 1

## 2020-11-28 MED ORDER — SODIUM CHLORIDE 0.9% FLUSH: 3.0000 mL | INTRAVENOUS | Status: DC | PRN

## 2020-11-28 MED ORDER — DOXAZOSIN MESYLATE 4 MG PO TABS
8.0000 mg | ORAL_TABLET | Freq: Every day | ORAL | Status: DC
  Administered 2020-11-29 – 2020-11-30 (×2): 8 mg via ORAL
  Filled 2020-11-28 (×2): qty 2
  Filled 2020-11-28 (×2): qty 1

## 2020-11-28 MED ORDER — IRBESARTAN 150 MG PO TABS: 300.0000 mg | ORAL_TABLET | Freq: Every day | ORAL | Status: DC

## 2020-11-28 MED ORDER — DILTIAZEM HCL 25 MG/5ML IV SOLN
INTRAVENOUS | Status: AC
  Administered 2020-11-28: 15 mg via INTRAVENOUS
  Filled 2020-11-28: qty 5

## 2020-11-28 MED ORDER — NICOTINE 21 MG/24HR TD PT24
21.0000 mg | MEDICATED_PATCH | Freq: Every day | TRANSDERMAL | Status: DC
  Administered 2020-11-28 – 2020-11-30 (×3): 21 mg via TRANSDERMAL
  Filled 2020-11-28 (×3): qty 1

## 2020-11-28 MED ORDER — SODIUM CHLORIDE 0.9 % IV SOLN: 250.0000 mL | INTRAVENOUS | Status: DC | PRN

## 2020-11-28 MED ORDER — DILTIAZEM LOAD VIA INFUSION
15.0000 mg | Freq: Once | INTRAVENOUS | Status: DC
  Filled 2020-11-28: qty 15

## 2020-11-28 MED ORDER — SODIUM CHLORIDE 0.9% FLUSH
3.0000 mL | Freq: Two times a day (BID) | INTRAVENOUS | Status: DC
  Administered 2020-11-28 – 2020-11-30 (×4): 3 mL via INTRAVENOUS

## 2020-11-28 MED ORDER — TORSEMIDE 100 MG PO TABS: 100.0000 mg | ORAL_TABLET | Freq: Every day | ORAL | Status: DC

## 2020-11-28 MED ORDER — DILTIAZEM HCL 30 MG PO TABS
30.0000 mg | ORAL_TABLET | Freq: Four times a day (QID) | ORAL | Status: DC
  Administered 2020-11-29: 30 mg via ORAL
  Filled 2020-11-28: qty 1

## 2020-11-28 MED ORDER — ROSUVASTATIN CALCIUM 10 MG PO TABS
20.0000 mg | ORAL_TABLET | Freq: Every day | ORAL | Status: DC
  Administered 2020-11-29 – 2020-11-30 (×2): 20 mg via ORAL
  Filled 2020-11-28 (×2): qty 2

## 2020-11-28 NOTE — ED Notes (Signed)
MD aware of patients HR 130-150s, afib.

## 2020-11-28 NOTE — Consult Note (Signed)
Remdesivir - Pharmacy Brief Note   O:  ALT: 13 CXR: "Chronic accentuation of interstitial markings without acute infiltrate." SpO2: 94-98% on RA   A/P:  Remdesivir 200 mg IVPB once followed by 100 mg IVPB daily x 4 days.   Lorna Dibble, [facility] 11/28/2020 11:47 AM

## 2020-11-28 NOTE — ED Notes (Signed)
Pt given 15mg  IV dilt while awaiting gtt to be verified from pharmacy per Dr. Joni Fears.

## 2020-11-28 NOTE — Progress Notes (Addendum)
Day shift RN states she turned heparin off because patient Is in SR: I informed her we usually have transitional po Cardizem and none is ordered as well as provider is not aware . Consulted with pharmacy/ no change in Cardizem protocol. Discussed in detail with Dr Damita Dunnings . She states to leave it off . But has now added po Cardizem . I informed her of the hypertension and patient states hes on a lot more BP meds at home. No new orders

## 2020-11-28 NOTE — ED Provider Notes (Signed)
[facility] Emergency Department Provider Note  ____________________________________________  Time seen: Approximately 8:01 AM  I have reviewed the triage vital signs and the nursing notes.   HISTORY  Chief Complaint Weakness    HPI [name] is a 75 y.o. male with a history of CHF, atrial fibrillation, ESRD on peritoneal dialysis, hypertension who comes ED complaining of generalized weakness and lightheadedness for the past 2 days.  Has persistently been doing dialysis according to usual routine.  Notes poor oral intake over the last few days due to loss of appetite and nausea.  Also has chills and body aches and productive cough.  Symptoms are constant, no aggravating or alleviating factors.  Denies any focal pain.  EMS report patient was found to be in atrial fibrillation with an initial heart rate of about 170.  They gave 500 mL of IV fluid bolus prior to arrival in the ED and gave 5 mg of IV metoprolol.    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 12/2018 Echo: EF 40-45%, global HK. Asc Ao 3.7cm; b. 01/2019 Lexi MV: small, mild, fixed basal and mid antlat defect - scar vs artifact. Small, mild mid and apical inf minimally reversible defect, likely scar w/ peri-infarct ischemia. Coronary and Ao atherosclerosis; c. 12/2019 Echo: EF 40-45%, glob HK, Gr1 DD. Nl RV fxn. Sev dil LA. Mild MR. Mild-mod Ao sclerosis w/o stenosis. Asc Ao 22mm.   Carotid arterial disease ([place])    a. 01/2020 RICA 1-39%, RCCA/RECA < 85%, LICA 6-31%, LCCA/LECA <50%.   Claudication ([place])    a. 02/2020 ABI/TBI: R 1.08/0.95, L 1.00/0.70.   Complication of anesthesia    had to be woken up slowly as his bp was elevated when did this quickly   Diabetes  mellitus without complication (HCC)    ESRD (end stage renal disease) ([place])    a. Peritoneal Dialysis pt.   Hyperlipidemia    Hypertension    Migraine    cluster   Neuromuscular disorder ([place])    left lower extrem neuropathy   Obstructive sleep apnea    no OSA since had facial surgery with dr. Kathyrn Sheriff in 1997   PAD (peripheral artery disease) [facility]) Feb 2009   nonobstructing, renal angiogram [name]   Renal cell carcinoma 2004   left kidney heminephrectomy   Renal insufficiency    Sepsis ([place]) 11/06/2020   Stroke ([place])    a. 12/2019 MRI: Small acute infarcts involving the left cerebral hemisphere and several chronic infarcts.   TIA (transient ischemic attack)    no residual but left leg and foot still feel heavy   tobacco abuse      Patient Active Problem List   Diagnosis Date Noted   Vitamin D deficiency 11/23/2020   Rectal bleeding 11/06/2020   Pneumoperitoneum 11/06/2020   HTN (hypertension) 11/06/2020   Type II diabetes mellitus with renal manifestations ([place]) 11/06/2020   CAD (coronary artery disease) 11/06/2020   Anemia in ESRD (end-stage renal disease) ([place]) 11/06/2020   Anemia due to blood loss 11/06/2020   Hypotension, iatrogenic 08/20/2020   ESRD on peritoneal dialysis ([place]) 08/06/2020   HFrEF (heart failure with reduced ejection fraction) (HCC)    Anemia of chronic disease    Chronic HFrEF (heart failure  with reduced ejection fraction) ([place]) 05/11/2020   Fluid overload 05/11/2020   End stage renal disease ([place]) 02/19/2020   Aortic atherosclerosis ([place]) 01/22/2020   Hospital discharge follow-up 01/22/2020   Neurologic deficit as late effect of ischemic cerebrovascular accident (CVA) 01/15/2020   Dissection of left subclavian artery ([place])    Cerebrovascular accident (CVA) ([place]) 01/14/2020   Constipation 05/13/2019   Cardiomyopathy ([place]) 04/26/2019   Coronary artery disease involving native coronary artery of native heart without angina pectoris 04/26/2019    ESRD (end stage renal disease) ([place]) 04/26/2019   Chronic pain not due to malignancy 04/12/2019   Chronic bilateral low back pain with bilateral sciatica 11/27/2018   Synovial cyst of lumbar spine 11/27/2018   Uncontrolled hypertension 11/14/2018   CKD (chronic kidney disease), stage V ([place]) 11/14/2018   Major depressive disorder with current active episode 07/26/2017   Anemia of chronic kidney failure, stage 4 (severe) ([place]) 07/26/2017   DOE (dyspnea on exertion) 11/25/2016   S/P UVPP (uvulopalatopharyngoplasty) 08/22/2014   Presbyacusis 08/22/2014   Adenocarcinoma, renal cell ([place]) 02/26/2014   Renal cell carcinoma ([place]) 02/26/2014   Malignant neoplasm of kidney excluding renal pelvis (HCC) 02/26/2014   Prostate CA ([place]) 07/30/2013   Prostate cancer ([place]) 07/30/2013   Elevated serum alkaline phosphatase level 07/30/2013   Malignant neoplasm of prostate ([place]) 07/30/2013   Hypertensive renal sclerosis with hypertension 10/18/2012   Renal sclerosis with hypertension 56/43/3295   Complication of diabetes mellitus ([place]) 07/16/2012   PAD (peripheral artery disease) ([place])    Left hip pain 04/20/2012   Erectile dysfunction 04/20/2012   Hip pain 04/20/2012   Temporary cerebral vascular dysfunction 01/15/2012   Tobacco abuse counseling 05/06/2011   Counseling on substance use and abuse 05/06/2011   Hyperlipidemia    History of renal cell carcinoma      Past Surgical History:  Procedure Laterality Date   CAPD INSERTION N/A 03/01/2019   Procedure: LAPAROSCOPIC INSERTION CONTINUOUS AMBULATORY PERITONEAL DIALYSIS  (CAPD) CATHETER;  Surgeon: Algernon Huxley, MD;  Location: [facility] ORS;  Service: Vascular;  Laterality: N/A;   CARDIAC CATHETERIZATION     Dr. [name] did this to assess his renal artery   cyst removal  12/25/2015   Spine L4 and L5   heminephrectomy  2004   for renal cell CA   RENAL CRYOABLATION  Jan 2006   right kidney,  [name]   sciatica       Prior to Admission medications    Medication Sig Start Date End Date Taking? Authorizing Provider  amLODipine (NORVASC) 10 MG tablet TAKE 1 TABLET BY MOUTH ONCE DAILY 06/09/20   Crecencio Mc, MD  Ascorbic Acid 500 MG CHEW Chew 1 tablet by mouth daily.    [provider]  aspirin 81 MG tablet Take 1 tablet (81 mg total) by mouth daily. 05/16/14   Crecencio Mc, MD  b complex vitamins capsule Take 1 capsule by mouth daily.    [provider]  calcitRIOL (ROCALTROL) 0.25 MCG capsule Take 0.25 mcg by mouth every Monday, Wednesday, and Friday.    [provider]  cloNIDine (CATAPRES - DOSED IN MG/24 HR) 0.1 mg/24hr patch Place 0.1 mg onto the skin every Friday.    [provider]  cloNIDine (CATAPRES) 0.2 MG tablet Take 0.2 mg by mouth at bedtime.    [provider]  diphenhydrAMINE (BENADRYL) 25 MG tablet Take 25 mg by mouth every 6 (six) hours as needed for allergies.    [provider]  doxazosin (CARDURA) 8 MG tablet Take 8 mg by mouth daily. 03/29/20   [provider]  ergocalciferol (DRISDOL) 1.25 MG (50000 UT) capsule Take 1 capsule (50,000 Units total) by mouth every 14 (fourteen) days. 11/23/20   Crecencio Mc, MD  fluticasone (FLONASE) 50 MCG/ACT nasal spray Place 2 sprays into both nostrils daily as needed for allergies or rhinitis.    [provider]  gentamicin cream (GARAMYCIN) 0.1 % Apply 1 application topically daily. 11/07/20   Fritzi Mandes, MD  irbesartan (AVAPRO) 300 MG tablet Take 1 tablet (300 mg total) by mouth daily. 08/10/20   Debbe Odea, MD  multivitamin (RENA-VIT) TABS tablet Take 1 tablet by mouth at bedtime. 12/21/19   [provider]  rosuvastatin (CRESTOR) 20 MG tablet Take 1 tablet (20 mg total) by mouth at bedtime. 04/02/20 04/02/21  Theora Gianotti, NP  sevelamer (RENAGEL) 800 MG tablet Take 800 mg by mouth 3 (three) times daily with meals.    [provider]  tiZANidine (ZANAFLEX) 2 MG tablet Take 1  tablet (2 mg total) by mouth every 12 (twelve) hours as needed for muscle spasms. 11/21/20   Crecencio Mc, MD  torsemide (DEMADEX) 100 MG tablet Take 100 mg by mouth daily.    [provider]     Allergies Irbesartan, Cymbalta [duloxetine hcl], Hydralazine, Imdur [isosorbide nitrate], Atorvastatin, and Bystolic [nebivolol hcl]   Family History  Problem Relation Age of Onset   Hypertension Mother    Cancer Mother        breast   Aneurysm Mother    Coronary artery disease Father    Hypertension Father    Stroke Father 5   Heart disease Father    Heart attack Father 57   Aneurysm Maternal Grandmother        brain   Aneurysm Paternal Grandmother        brain   Coronary artery disease Paternal Grandfather    Heart disease Brother        valvular heart disease   COPD Brother    Hypertension Brother    Stroke Paternal Uncle     Social History Social History   Tobacco Use   Smoking status: Every Day    Packs/day: 1.00    Years: 50.00    Pack years: 50.00    Types: Cigarettes   Smokeless tobacco: Never  Vaping Use   Vaping Use: Former   Devices: tried but did not like  Substance Use Topics   Alcohol use: Not Currently   Drug use: No    Review of Systems  Constitutional:   No fever positive chills.  ENT:   No sore throat. No rhinorrhea. Cardiovascular:   No chest pain or syncope. Respiratory:   No dyspnea positive cough. Gastrointestinal:   Negative for abdominal pain, vomiting and diarrhea.  Musculoskeletal:   Negative for focal pain or swelling All other systems reviewed and are negative except as documented above in ROS and HPI.  ____________________________________________   PHYSICAL EXAM:  VITAL SIGNS: ED Triage Vitals  Enc Vitals Group     BP 11/28/20 0738 (!) 131/94     Pulse Rate 11/28/20 0738 (!) 134     Resp 11/28/20 0738 (!) 22     Temp 11/28/20 0738 97.9 F (36.6 C)     Temp Source 11/28/20 0738 Oral     SpO2 11/28/20 0738 93 %      Weight 11/28/20 0739 140 lb (63.5 kg)  Height 11/28/20 0739 5\' 6"  (1.676 m)     Head Circumference --      Peak Flow --      Pain Score 11/28/20 0739 0     Pain Loc --      Pain Edu? --      Excl. in [place]? --     Vital signs reviewed, nursing assessments reviewed.   Constitutional:   Alert and oriented. Non-toxic appearance. Eyes:   Conjunctivae are normal. EOMI. PERRL. ENT      Head:   Normocephalic and atraumatic.      Nose:   Normal.      Mouth/Throat:   Dry mucous membranes      Neck:   No meningismus. Full ROM. Hematological/Lymphatic/Immunilogical:   No cervical lymphadenopathy. Cardiovascular:   Tachycardia heart rate 130. Symmetric bilateral radial and DP pulses.  No murmurs. Cap refill less than 2 seconds. Respiratory:   Mild tachypnea, respiratory rate of 22.  Diffuse end expiratory wheezing. Gastrointestinal:   Soft and nontender. Non distended. There is no CVA tenderness.  No rebound, rigidity, or guarding. Genitourinary:   deferred Musculoskeletal:   Normal range of motion in all extremities. No joint effusions.  No lower extremity tenderness.  No edema. Neurologic:   Normal speech and language.  Motor grossly intact. No acute focal neurologic deficits are appreciated.  Skin:    Skin is warm, dry and intact. No rash noted.  No petechiae, purpura, or bullae.  ____________________________________________    LABS (pertinent positives/negatives) (all labs ordered are listed, but only abnormal results are displayed) Labs Reviewed  RESP PANEL BY RT-PCR (FLU A&B, COVID) ARPGX2 - Abnormal; Notable for the following components:      Result Value   SARS Coronavirus 2 by RT PCR POSITIVE (*)    All other components within normal limits  BASIC METABOLIC PANEL - Abnormal; Notable for the following components:   CO2 21 (*)    Glucose, Bld 126 (*)    BUN 76 (*)    Creatinine, Ser 7.88 (*)    Calcium 7.9 (*)    GFR, Estimated 7 (*)    All other components within normal  limits  CBC - Abnormal; Notable for the following components:   RBC 3.02 (*)    Hemoglobin 9.4 (*)    HCT 27.7 (*)    All other components within normal limits  URINALYSIS, COMPLETE (UACMP) WITH MICROSCOPIC - Abnormal; Notable for the following components:   Color, Urine STRAW (*)    APPearance CLEAR (*)    Glucose, UA 50 (*)    Protein, ur 30 (*)    All other components within normal limits  CULTURE, BLOOD (SINGLE)  LACTIC ACID, PLASMA  PROCALCITONIN  CBG MONITORING, ED   ____________________________________________   EKG  Interpreted by me Atrial fibrillation, rate of 136.  Left axis, normal intervals.  Poor R wave progression.  Slight diffuse ST depression, suspect rate related demand ischemia.  Normal T waves.  No ST elevations.  ____________________________________________    RADIOLOGY  DG Chest Portable 1 View  Result Date: 11/28/2020 CLINICAL DATA:  Cough, chills, weakness, history atrial fibrillation, renal cell carcinoma, and seal carcinoma, CHF, cardiomyopathy, diabetes mellitus, end-stage renal disease, hypertension, smoker EXAM: PORTABLE CHEST 1 VIEW COMPARISON:  Portable exam 0814 hours compared to 08/07/2020 FINDINGS: Enlargement of cardiac silhouette. Mediastinal contours and pulmonary vascularity normal. Atherosclerotic calcification aorta. Emphysematous and minimal bronchitic changes consistent with COPD. Minimal chronic interstitial prominence. No acute infiltrate, pleural effusion, or  pneumothorax. Bones demineralized. IMPRESSION: Enlargement of cardiac silhouette. Chronic accentuation of interstitial markings without acute infiltrate. Aortic Atherosclerosis (ICD10-I70.0) and Emphysema (ICD10-J43.9). Electronically Signed   By: Lavonia Dana M.D.   On: 11/28/2020 08:43    ____________________________________________   PROCEDURES .Critical Care  Date/Time: 11/28/2020 11:31 AM Performed by: Carrie Mew, MD Authorized by: Carrie Mew, MD   Critical  care provider statement:    Critical care time (minutes):  35   Critical care time was exclusive of:  Separately billable procedures and treating other patients   Critical care was necessary to treat or prevent imminent or life-threatening deterioration of the following conditions:  Cardiac failure and circulatory failure   Critical care was time spent personally by me on the following activities:  Development of treatment plan with patient or surrogate, discussions with consultants, evaluation of patient's response to treatment, examination of patient, obtaining history from patient or surrogate, ordering and performing treatments and interventions, ordering and review of laboratory studies, ordering and review of radiographic studies, pulse oximetry, re-evaluation of patient's condition and review of old charts  ____________________________________________  DIFFERENTIAL DIAGNOSIS   Pneumonia, viral illness, dehydration, electrolyte abnormality, COPD exacerbation  CLINICAL IMPRESSION / ASSESSMENT AND PLAN / ED COURSE  Medications ordered in the ED: Medications  diltiazem (CARDIZEM) 1 mg/mL load via infusion 15 mg (has no administration in time range)    And  diltiazem (CARDIZEM) 125 mg in dextrose 5% 125 mL (1 mg/mL) infusion (has no administration in time range)  cefTRIAXone (ROCEPHIN) 2 g in sodium chloride 0.9 % 100 mL IVPB (has no administration in time range)  azithromycin (ZITHROMAX) 500 mg in sodium chloride 0.9 % 250 mL IVPB (has no administration in time range)  sodium chloride 0.9 % bolus 500 mL (0 mLs Intravenous Stopped 11/28/20 1056)    Pertinent labs & imaging results that were available during my care of the patient were reviewed by me and considered in my medical decision making (see chart for details).  [name] was evaluated in Emergency Department on 11/28/2020 for the symptoms described in the history of present illness. He was evaluated in the context of the global  COVID-19 pandemic, which necessitated consideration that the patient might be at risk for infection with the SARS-CoV-2 virus that causes COVID-19. Institutional protocols and algorithms that pertain to the evaluation of patients at risk for COVID-19 are in a state of rapid change based on information released by regulatory bodies including the CDC and federal and state organizations. These policies and algorithms were followed during the patient's care in the ED.   Patient presents with atrial fibrillation with rapid ventricular response.  Appears dehydrated.  Suspect pneumonia versus viral illness.  Will get chest x-ray and COVID swab.  IV fluids.  Check labs and chest x-ray.  Clinical Course as of 11/28/20 1131  Fri Nov 28, 2020  1127 COVID-positive.  Having persistent A. fib with RVR.  Procalcitonin is also elevated.  We will give a dose of Rocephin, start remdesivir, start diltiazem infusion , plan to admit. [PS]    Clinical Course User Index [PS] Carrie Mew, MD     ____________________________________________   FINAL CLINICAL IMPRESSION(S) / ED DIAGNOSES    Final diagnoses:  Atrial fibrillation with rapid ventricular response ([place])  COVID-19 virus infection  ESRD on peritoneal dialysis [facility])     ED Discharge Orders     None       Portions of this note were generated with dragon dictation software.  Dictation errors may occur despite best attempts at proofreading.    Carrie Mew, MD 11/28/20 [phone]

## 2020-11-28 NOTE — ED Notes (Signed)
Diet tray requested for patient.

## 2020-11-28 NOTE — ED Notes (Signed)
EDP at bedside with pt.  

## 2020-11-28 NOTE — ED Notes (Signed)
Dr Agbata at bedside. 

## 2020-11-28 NOTE — ED Triage Notes (Signed)
Pt to ED from home called [facility] for weakness and dizziness since 2days Pt has peritoneal dialysis access LLQ abdomen, last dialysis was last night EMS found pt in a fib (hx same), with rate 160-170. Pt received 5mg  metoprolol IV, rate down to 130s. CBG was 102. Pt alert and oriented, denies SOB, CP.

## 2020-11-28 NOTE — Progress Notes (Signed)
[facility]  ROUNDING NOTE   Subjective:   [name] is a 75 y.o. male with past medical history of diabetes, A-fib, sCHF, sleep apnea and ESRD on peritoneal dialysis. He presents to ED with complaints of weakness and dizziness for 2 days. He also complains of shortness of breath, decreased appetite, productive cough and muscle pains.   Patient is admitted for Atrial fibrillation with rapid ventricular response ([place]) [I48.91]  We have been consulted to manage peritoneal dialysis during this admission. Patient currently followed by [facility], receiving care and PD management with Dr Juleen China. Patient seen resting on stretcher. Alert, oriented, appears tired and withdrawn. Heart rate sustaining in the upper 120s. History of A-fib, not on anticoagulation. He did complete PD last night. No concerns. States he has shortness of breath, currently on room air with O2 sats 95-97%. Appetite has decreased over the past couple days.    Objective:  Vital signs in last 24 hours:  Temp:  [97.9 F (36.6 C)] 97.9 F (36.6 C) (07/08 0738) Pulse Rate:  [63-134] 86 (07/08 1330) Resp:  [15-30] 21 (07/08 1330) BP: (118-160)/(64-112) 154/72 (07/08 1330) SpO2:  [92 %-98 %] 98 % (07/08 1330) Weight:  [63.5 kg] 63.5 kg (07/08 0739)  Weight change:  Filed Weights   11/28/20 0739  Weight: 63.5 kg    Intake/Output: No intake/output data recorded.   Intake/Output this shift:  Total I/O In: 1000 [I.V.:500; IV Piggyback:500] Out: 200 [Urine:200]  Physical Exam: General: NAD, resting in bed  Head: Normocephalic, atraumatic. Moist oral mucosal membranes  Eyes: Anicteric  Lungs:  Clear to auscultation, normal effort  Heart: Irregular rhythm  Abdomen:  Soft, nontender  Extremities:  no peripheral edema.  Neurologic: Alert, moving all four extremities  Skin: No lesions  Access: PD catheter    Basic Metabolic Panel: Recent Labs  Lab 11/28/20 0745  NA 138  K 3.5  CL 105  CO2 21*   GLUCOSE 126*  BUN 76*  CREATININE 7.88*  CALCIUM 7.9*    Liver Function Tests: No results for input(s): AST, ALT, ALKPHOS, BILITOT, PROT, ALBUMIN in the last 168 hours. No results for input(s): LIPASE, AMYLASE in the last 168 hours. No results for input(s): AMMONIA in the last 168 hours.  CBC: Recent Labs  Lab 11/28/20 0745  WBC 9.8  HGB 9.4*  HCT 27.7*  MCV 91.7  PLT 170    Cardiac Enzymes: No results for input(s): CKTOTAL, CKMB, CKMBINDEX, TROPONINI in the last 168 hours.  BNP: Invalid input(s): POCBNP  CBG: No results for input(s): GLUCAP in the last 168 hours.  Microbiology: Results for orders placed or performed during the hospital encounter of 11/28/20  Resp Panel by RT-PCR (Flu A&B, Covid) Nasopharyngeal Swab     Status: Abnormal   Collection Time: 11/28/20  8:01 AM   Specimen: Nasopharyngeal Swab; Nasopharyngeal(NP) swabs in vial transport medium  Result Value Ref Range Status   SARS Coronavirus 2 by RT PCR POSITIVE (A) NEGATIVE Final    Comment: RESULT CALLED TO, READ BACK BY AND VERIFIED WITH: Charlotte Crumb, RN @0924  ON 07.08.22 (NOTE) SARS-CoV-2 target nucleic acids are DETECTED.  The SARS-CoV-2 RNA is generally detectable in upper respiratory specimens during the acute phase of infection. Positive results are indicative of the presence of the identified virus, but do not rule out bacterial infection or co-infection with other pathogens not detected by the test. Clinical correlation with patient history and other diagnostic information is necessary to determine patient infection status. The  expected result is Negative.  Fact Sheet for Patients: EntrepreneurPulse.com.au  Fact Sheet for Healthcare Providers: IncredibleEmployment.be  This test is not yet approved or cleared by the [place] FDA and  has been authorized for detection and/or diagnosis of SARS-CoV-2 by FDA under an Emergency Use Authorization  (EUA).  This EUA will remain in effect (meaning this test can  be used) for the duration of  the COVID-19 declaration under Section 564(b)(1) of the Act, 21 U.S.C. section 360bbb-3(b)(1), unless the authorization is terminated or revoked sooner.     Influenza A by PCR NEGATIVE NEGATIVE Final   Influenza B by PCR NEGATIVE NEGATIVE Final    Comment: (NOTE) The Xpert Xpress SARS-CoV-2/FLU/RSV plus assay is intended as an aid in the diagnosis of influenza from Nasopharyngeal swab specimens and should not be used as a sole basis for treatment. Nasal washings and aspirates are unacceptable for Xpert Xpress SARS-CoV-2/FLU/RSV testing.  Fact Sheet for Patients: EntrepreneurPulse.com.au  Fact Sheet for Healthcare Providers: IncredibleEmployment.be  This test is not yet approved or cleared by the [place] FDA and has been authorized for detection and/or diagnosis of SARS-CoV-2 by FDA under an Emergency Use Authorization (EUA). This EUA will remain in effect (meaning this test can be used) for the duration of the COVID-19 declaration under Section 564(b)(1) of the Act, 21 U.S.C. section 360bbb-3(b)(1), unless the authorization is terminated or revoked.  Performed at [facility], [address] [place], [place] [address]     Coagulation Studies: No results for input(s): LABPROT, INR in the last 72 hours.  Urinalysis: Recent Labs    11/28/20 1055  COLORURINE STRAW*  LABSPEC 1.008  PHURINE 7.0  GLUCOSEU 50*  HGBUR NEGATIVE  BILIRUBINUR NEGATIVE  KETONESUR NEGATIVE  PROTEINUR 30*  NITRITE NEGATIVE  LEUKOCYTESUR NEGATIVE      Imaging: DG Chest Portable 1 View  Result Date: 11/28/2020 CLINICAL DATA:  Cough, chills, weakness, history atrial fibrillation, renal cell carcinoma, and seal carcinoma, CHF, cardiomyopathy, diabetes mellitus, end-stage renal disease, hypertension, smoker EXAM: PORTABLE CHEST 1 VIEW COMPARISON:   Portable exam 0814 hours compared to 08/07/2020 FINDINGS: Enlargement of cardiac silhouette. Mediastinal contours and pulmonary vascularity normal. Atherosclerotic calcification aorta. Emphysematous and minimal bronchitic changes consistent with COPD. Minimal chronic interstitial prominence. No acute infiltrate, pleural effusion, or pneumothorax. Bones demineralized. IMPRESSION: Enlargement of cardiac silhouette. Chronic accentuation of interstitial markings without acute infiltrate. Aortic Atherosclerosis (ICD10-I70.0) and Emphysema (ICD10-J43.9). Electronically Signed   By: Lavonia Dana M.D.   On: 11/28/2020 08:43     Medications:    sodium chloride     diltiazem (CARDIZEM) infusion 5 mg/hr (11/28/20 1227)    albuterol  2 puff Inhalation Q6H   vitamin C  500 mg Oral Daily   Ascorbic Acid  1 tablet Oral Daily   aspirin  81 mg Oral Daily   b complex vitamins  1 capsule Oral Daily   calcitRIOL  0.25 mcg Oral Q M,W,F   cloNIDine  0.1 mg Transdermal Q Fri   diltiazem  15 mg Intravenous Once   doxazosin  8 mg Oral Daily   ergocalciferol  50,000 Units Oral Q14 Days   gentamicin cream  1 application Topical Daily   multivitamin  1 tablet Oral QHS   nicotine  21 mg Transdermal Daily   rosuvastatin  20 mg Oral Daily   sevelamer carbonate  800 mg Oral TID WC   sodium chloride flush  3 mL Intravenous Q12H   zinc sulfate  220 mg Oral  Daily   sodium chloride, acetaminophen, fluticasone, guaiFENesin-dextromethorphan, ondansetron (ZOFRAN) IV, sodium chloride flush, tiZANidine  Assessment/ Plan:  [name] is a 75 y.o.  male with past medical history of diabetes, A-fib, sCHF, sleep apnea and ESRD on peritoneal dialysis. He presents to ED with complaints of weakness and dizziness for 2 days. He also complains of shortness of breath, decreased appetite, productive cough and muscle pains.   [facility] CCPD- 3 cycles/2L fill/20 min drain/32min fill  End stage renal disease on peritoneal  dialysis  Last treatment last night   Will maintain outpatient schedule   Will perform dialysis tonight, if placed in inpatient room  2. Anemia of chronic kidney disease  Lab Results  Component Value Date   HGB 9.4 (L) 11/28/2020   Hgb below target  EPO and Venofer outpatient  Will monitor   3. Secondary Hyperparathyroidism:  Lab Results  Component Value Date   PTH 315 (H) 05/11/2017   CALCIUM 7.9 (L) 11/28/2020   PHOS 5.5 (H) 08/09/2020   Phosphorus elevated Calcitriol and Renvela outpatient These medications are continued during admission  4. Diabetes mellitus type II with chronic kidney disease Noninsulin dependent. Most recent hemoglobin A1c is 6.9 on 11/21/20.  Stable at this time       LOS: 0 [name] 7/8/20222:30 PM

## 2020-11-28 NOTE — ED Notes (Signed)
Dr Joni Fears aware of HR in 130s. Plan for now is to provide IV rehydration to pt then reevaluate. Pt resting in bed, IVF bolus infusing.

## 2020-11-28 NOTE — H&P (Signed)
History and Physical    [name] UXL:[id number] DOB: 10-30-1945 DOA: 11/28/2020  PCP: Crecencio Mc, MD   Patient coming from: Home  I have personally briefly reviewed patient's old medical records in [place]  Chief Complaint: Weakness  HPI: [name] is a 75 y.o. male with medical history significant for diabetes mellitus with complications of end-stage renal disease on peritoneal dialysis, atrial fibrillation not on anticoagulation due to bleeding, history of chronic systolic CHF, history of TIA, obstructive sleep apnea who presents to the ER via EMS for evaluation of weakness and dizziness for 2 days.  Patient states that he has been very weak and unable to get out of bed he also complains of myalgias, shortness of breath at rest, fever, cough productive of clear phlegm, anorexia and diarrhea. When EMS arrived he was found to be in rapid A. fib with heart rate between 160 and 170 bpm.  He received a dose of metoprolol 5 mg IV with improvement in his heart rate to 130. He denies having any chest pain, no abdominal pain, no constipation, no diaphoresis, no headache, no blurred vision, no focal deficits. Labs show sodium 138, potassium 3.5, chloride 105, bicarb 21, glucose 126, BUN 76, creatinine 7.8, calcium 7.9, lactic acid 0.8, procalcitonin 0.91, white count 9.8, hemoglobin 9.4, hematocrit 27.7, MCV 91.7, RDW 14.8, platelet count 170 Patient SARS coronavirus 2 point-of-care test positive Chest x-ray reviewed by me shows enlargement of the cardiac silhouette.  Chronic interstitial markings without infiltrate.  Emphysema. Twelve-lead EKG reviewed by me shows atrial fibrillation with a rapid ventricular rate.   ED Course: Patient is a 75 year old male who presents to the ER via EMS for evaluation of weakness.  When EMS arrived he was noted to be tachycardic and in rapid atrial fibrillation with heart rate of about 160 bpm. He received a dose of metoprolol 5 mg IV with  slight improvement in his heart rate and was started on a Cardizem drip in the emergency room. Patient's SARS coronavirus 2 point-of-care test was positive.  He received 1 dose of the Moderna vaccine and denies having any sick contacts. He received a dose of remdesivir in the ER and will be admitted to the hospital for further evaluation.   Review of Systems: As per HPI otherwise all other systems reviewed and negative.    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 12/2018 Echo: EF 40-45%, global HK. Asc Ao 3.7cm; b. 01/2019 [facility] MV: small, mild, fixed basal and mid antlat defect - scar vs artifact. Small, mild mid and apical inf minimally reversible defect, likely scar w/ peri-infarct ischemia. Coronary and Ao atherosclerosis; c. 12/2019 Echo: EF 40-45%, glob HK, Gr1 DD. Nl RV fxn. Sev dil LA. Mild MR. Mild-mod Ao sclerosis w/o stenosis. Asc Ao 66mm.   Carotid arterial disease ([place])    a. 01/2020 RICA 1-39%, RCCA/RECA < 53%, LICA 6-64%, LCCA/LECA <50%.   Claudication ([place])    a. 02/2020 ABI/TBI: R 1.08/0.95, L 1.00/0.70.   Complication of anesthesia    had to be woken up slowly as his bp was elevated when did this quickly   Diabetes mellitus without complication (HCC)    ESRD (end stage renal disease) ([place])    a. Peritoneal  Dialysis pt.   Hyperlipidemia    Hypertension    Migraine    cluster   Neuromuscular disorder ([place])    left lower extrem neuropathy   Obstructive sleep apnea    no OSA since had facial surgery with dr. Kathyrn Sheriff in 1997   PAD (peripheral artery disease) [facility]) Feb 2009   nonobstructing, renal angiogram [name]   Renal cell carcinoma 2004   left kidney heminephrectomy   Renal insufficiency    Sepsis ([place]) 11/06/2020   Stroke ([place])    a.  12/2019 MRI: Small acute infarcts involving the left cerebral hemisphere and several chronic infarcts.   TIA (transient ischemic attack)    no residual but left leg and foot still feel heavy   tobacco abuse     Past Surgical History:  Procedure Laterality Date   CAPD INSERTION N/A 03/01/2019   Procedure: LAPAROSCOPIC INSERTION CONTINUOUS AMBULATORY PERITONEAL DIALYSIS  (CAPD) CATHETER;  Surgeon: Algernon Huxley, MD;  Location: [facility] ORS;  Service: Vascular;  Laterality: N/A;   CARDIAC CATHETERIZATION     Dr. [name] did this to assess his renal artery   cyst removal  12/25/2015   Spine L4 and L5   heminephrectomy  2004   for renal cell CA   RENAL CRYOABLATION  Jan 2006   right kidney,  [name]   sciatica       reports that he has been smoking cigarettes. He has a 50.00 pack-year smoking history. He has never used smokeless tobacco. He reports previous alcohol use. He reports that he does not use drugs.  Allergies  Allergen Reactions   Irbesartan Other (See Comments)    hyperkalemia   Cymbalta [Duloxetine Hcl]    Hydralazine Other (See Comments)    headache   Imdur [Isosorbide Nitrate] Other (See Comments)    headache   Atorvastatin Other (See Comments)    Muscle pain   Bystolic [Nebivolol Hcl]     Extreme fatigue     Family History  Problem Relation Age of Onset   Hypertension Mother    Cancer Mother        breast   Aneurysm Mother    Coronary artery disease Father    Hypertension Father    Stroke Father 43   Heart disease Father    Heart attack Father 62   Aneurysm Maternal Grandmother        brain   Aneurysm Paternal Grandmother        brain   Coronary artery disease Paternal Grandfather    Heart disease Brother        valvular heart disease   COPD Brother    Hypertension Brother    Stroke Paternal Uncle       Prior to Admission medications   Medication Sig Start Date End Date Taking? Authorizing Provider  amLODipine (NORVASC) 10 MG tablet TAKE 1 TABLET BY  MOUTH ONCE DAILY 06/09/20   Crecencio Mc, MD  Ascorbic Acid 500 MG CHEW Chew 1 tablet by mouth daily.    [provider]  aspirin 81 MG tablet Take 1 tablet (81 mg total) by mouth daily. 05/16/14   Crecencio Mc, MD  b complex vitamins capsule Take 1 capsule by mouth daily.    [provider]  calcitRIOL (ROCALTROL) 0.25 MCG capsule Take 0.25 mcg by mouth every Monday, Wednesday, and Friday.    [provider]  cloNIDine (CATAPRES - DOSED IN MG/24 HR) 0.1 mg/24hr patch Place 0.1 mg onto the skin every  Friday.    [provider]  cloNIDine (CATAPRES) 0.2 MG tablet Take 0.2 mg by mouth at bedtime.    [provider]  diphenhydrAMINE (BENADRYL) 25 MG tablet Take 25 mg by mouth every 6 (six) hours as needed for allergies.    [provider]  doxazosin (CARDURA) 8 MG tablet Take 8 mg by mouth daily. 03/29/20   [provider]  ergocalciferol (DRISDOL) 1.25 MG (50000 UT) capsule Take 1 capsule (50,000 Units total) by mouth every 14 (fourteen) days. 11/23/20   Crecencio Mc, MD  fluticasone (FLONASE) 50 MCG/ACT nasal spray Place 2 sprays into both nostrils daily as needed for allergies or rhinitis.    [provider]  gentamicin cream (GARAMYCIN) 0.1 % Apply 1 application topically daily. 11/07/20   Fritzi Mandes, MD  irbesartan (AVAPRO) 300 MG tablet Take 1 tablet (300 mg total) by mouth daily. 08/10/20   Debbe Odea, MD  multivitamin (RENA-VIT) TABS tablet Take 1 tablet by mouth at bedtime. 12/21/19   [provider]  rosuvastatin (CRESTOR) 20 MG tablet Take 1 tablet (20 mg total) by mouth at bedtime. 04/02/20 04/02/21  Theora Gianotti, NP  sevelamer (RENAGEL) 800 MG tablet Take 800 mg by mouth 3 (three) times daily with meals.    [provider]  tiZANidine (ZANAFLEX) 2 MG tablet Take 1 tablet (2 mg total) by mouth every 12 (twelve) hours as needed for muscle spasms. 11/21/20   Crecencio Mc, MD   torsemide (DEMADEX) 100 MG tablet Take 100 mg by mouth daily.    [provider]    Physical Exam: Vitals:   11/28/20 1145 11/28/20 1200 11/28/20 1230 11/28/20 1250  BP:  125/68 118/67   Pulse: 67 63 (!) 107 94  Resp: (!) 21 (!) 22 19 15   Temp:      TempSrc:      SpO2: 95% 95% 96% 92%  Weight:      Height:         Vitals:   11/28/20 1145 11/28/20 1200 11/28/20 1230 11/28/20 1250  BP:  125/68 118/67   Pulse: 67 63 (!) 107 94  Resp: (!) 21 (!) 22 19 15   Temp:      TempSrc:      SpO2: 95% 95% 96% 92%  Weight:      Height:          Constitutional: Alert and oriented x 3 . Not in any apparent distress.  Generalized weakness HEENT:      Head: Normocephalic and atraumatic.         Eyes: PERLA, EOMI, Conjunctivae are normal. Sclera is non-icteric.       Mouth/Throat: Mucous membranes are moist.       Neck: Supple with no signs of meningismus. Cardiovascular: Irregularly irregular, tachycardic. No murmurs, gallops, or rubs. 2+ symmetrical distal pulses are present . No JVD. No LE edema Respiratory: Respiratory effort normal .bilateral air entry.  No wheezes, crackles, or rhonchi.  Gastrointestinal: Soft, non tender, and non distended with positive bowel sounds.  PD catheter in place Genitourinary: No CVA tenderness. Musculoskeletal: Nontender with normal range of motion in all extremities. No cyanosis, or erythema of extremities. Neurologic:  Face is symmetric. Moving all extremities. No gross focal neurologic deficits .  Generalized weakness Skin: Skin is warm, dry.  No rash or ulcers Psychiatric: Mood and affect are normal    Labs on Admission: I have personally reviewed following labs and imaging studies  CBC: Recent Labs  Lab 11/28/20 0745  WBC 9.8  HGB 9.4*  HCT 27.7*  MCV 91.7  PLT 818   Basic Metabolic Panel: Recent Labs  Lab 11/28/20 0745  NA 138  K 3.5  CL 105  CO2 21*  GLUCOSE 126*  BUN 76*  CREATININE 7.88*  CALCIUM 7.9*    GFR: Estimated Creatinine Clearance: 7.3 mL/min (A) (by C-G formula based on SCr of 7.88 mg/dL (H)). Liver Function Tests: No results for input(s): AST, ALT, ALKPHOS, BILITOT, PROT, ALBUMIN in the last 168 hours. No results for input(s): LIPASE, AMYLASE in the last 168 hours. No results for input(s): AMMONIA in the last 168 hours. Coagulation Profile: No results for input(s): INR, PROTIME in the last 168 hours. Cardiac Enzymes: No results for input(s): CKTOTAL, CKMB, CKMBINDEX, TROPONINI in the last 168 hours. BNP (last 3 results) No results for input(s): PROBNP in the last 8760 hours. HbA1C: No results for input(s): HGBA1C in the last 72 hours. CBG: No results for input(s): GLUCAP in the last 168 hours. Lipid Profile: No results for input(s): CHOL, HDL, LDLCALC, TRIG, CHOLHDL, LDLDIRECT in the last 72 hours. Thyroid Function Tests: No results for input(s): TSH, T4TOTAL, FREET4, T3FREE, THYROIDAB in the last 72 hours. Anemia Panel: No results for input(s): VITAMINB12, FOLATE, FERRITIN, TIBC, IRON, RETICCTPCT in the last 72 hours. Urine analysis:    Component Value Date/Time   COLORURINE STRAW (A) 11/28/2020 1055   APPEARANCEUR CLEAR (A) 11/28/2020 1055   APPEARANCEUR Clear 01/03/2012 1117   LABSPEC 1.008 11/28/2020 1055   LABSPEC 1.004 01/03/2012 1117   PHURINE 7.0 11/28/2020 1055   GLUCOSEU 50 (A) 11/28/2020 1055   GLUCOSEU 100 (A) 01/21/2020 1223   HGBUR NEGATIVE 11/28/2020 1055   BILIRUBINUR NEGATIVE 11/28/2020 1055   BILIRUBINUR neg 07/30/2013 1127   BILIRUBINUR Negative 01/03/2012 1117   KETONESUR NEGATIVE 11/28/2020 1055   PROTEINUR 30 (A) 11/28/2020 1055   UROBILINOGEN 0.2 01/21/2020 1223   NITRITE NEGATIVE 11/28/2020 1055   LEUKOCYTESUR NEGATIVE 11/28/2020 1055   LEUKOCYTESUR Negative 01/03/2012 1117    Radiological Exams on Admission: DG Chest Portable 1 View  Result Date: 11/28/2020 CLINICAL DATA:  Cough, chills, weakness, history atrial fibrillation,  renal cell carcinoma, and seal carcinoma, CHF, cardiomyopathy, diabetes mellitus, end-stage renal disease, hypertension, smoker EXAM: PORTABLE CHEST 1 VIEW COMPARISON:  Portable exam 0814 hours compared to 08/07/2020 FINDINGS: Enlargement of cardiac silhouette. Mediastinal contours and pulmonary vascularity normal. Atherosclerotic calcification aorta. Emphysematous and minimal bronchitic changes consistent with COPD. Minimal chronic interstitial prominence. No acute infiltrate, pleural effusion, or pneumothorax. Bones demineralized. IMPRESSION: Enlargement of cardiac silhouette. Chronic accentuation of interstitial markings without acute infiltrate. Aortic Atherosclerosis (ICD10-I70.0) and Emphysema (ICD10-J43.9). Electronically Signed   By: Lavonia Dana M.D.   On: 11/28/2020 08:43     Assessment/Plan Principal Problem:   Atrial fibrillation with rapid ventricular response (HCC) Active Problems:   HTN (hypertension)   tobacco abuse   ESRD on peritoneal dialysis ([place])   Anemia in ESRD (end-stage renal disease) ([place])   COVID-19 virus infection     Atrial fibrillation with rapid ventricular response Patient has a history of atrial fibrillation not on long-term anticoagulation due to history of bleeding He presents to the ER for evaluation of weakness and is found to be in rapid A. Fib Continue Cardizem initiated in the ER We will start patient on oral Cardizem and discontinue amlodipine for rate control     COVID-19 viral infection Patient with complaints of fever, chills, myalgias, anorexia, shortness of breath and  diarrhea. His point-of-care COVID-19 test is positive He received 1 dose of the Moderna vaccine but denies having any sick contacts Will place patient on remdesivir for 3 days Supportive care with antitussives, as needed bronchodilator therapy and vitamins. Patient is not hypoxic on room air at rest his pulse oximetry is 95% Check pulse oximetry every shift No need for  systemic steroids at this time Monitor inflammatory markers     Diabetes mellitus with complications of end-stage renal disease on peritoneal dialysis. Will consult nephrology for renal replacement therapy Diabetes is diet-controlled Check blood sugars before meals and at bedtime     Nicotine dependence Smoking cessation has been discussed with patient in detail Will place patient on nicotine transdermal patch 21 mg daily     Chronic systolic heart failure Hold torsemide and Avapro due to relative hypotension     Anemia end-stage renal disease H&H is stable   DVT prophylaxis: Lovenox Code Status: full code  Family Communication: Greater than 50% of time was spent discussing patient's condition and plan of care with him at the bedside.  All questions and concerns have been addressed.  He verbalizes understanding and agrees with the plan. Disposition Plan: Back to previous home environment Consults called: Nephrology Status: At the time of admission, it appears that the appropriate admission status for this patient is inpatient. This is judged to be reasonable and necessary in order to provide the required intensity of service to ensure the patient's safety given the presenting symptoms, physical exam findings, and initial radiographic and laboratory data in the context of their comorbid conditions. Patient requires inpatient status due to high intensity of service, high risk for further deterioration and high frequency of surveillance required.    Collier Bullock MD [name]     11/28/2020, 1:29 PM

## 2020-11-29 DIAGNOSIS — D509 Iron deficiency anemia, unspecified: Secondary | ICD-10-CM | POA: Diagnosis not present

## 2020-11-29 DIAGNOSIS — Z992 Dependence on renal dialysis: Secondary | ICD-10-CM | POA: Diagnosis not present

## 2020-11-29 DIAGNOSIS — N186 End stage renal disease: Secondary | ICD-10-CM | POA: Diagnosis not present

## 2020-11-29 DIAGNOSIS — N2581 Secondary hyperparathyroidism of renal origin: Secondary | ICD-10-CM | POA: Diagnosis not present

## 2020-11-29 DIAGNOSIS — D631 Anemia in chronic kidney disease: Secondary | ICD-10-CM | POA: Diagnosis not present

## 2020-11-29 LAB — COMPREHENSIVE METABOLIC PANEL
ALT: 18 U/L (ref 0–44)
AST: 22 U/L (ref 15–41)
Albumin: 2.5 g/dL — ABNORMAL LOW (ref 3.5–5.0)
Alkaline Phosphatase: 83 U/L (ref 38–126)
Anion gap: 8 (ref 5–15)
BUN: 66 mg/dL — ABNORMAL HIGH (ref 8–23)
CO2: 23 mmol/L (ref 22–32)
Calcium: 8 mg/dL — ABNORMAL LOW (ref 8.9–10.3)
Chloride: 108 mmol/L (ref 98–111)
Creatinine, Ser: 7.41 mg/dL — ABNORMAL HIGH (ref 0.61–1.24)
GFR, Estimated: 7 mL/min — ABNORMAL LOW (ref >60)
Glucose, Bld: 89 mg/dL (ref 70–99)
Potassium: 3.3 mmol/L — ABNORMAL LOW (ref 3.5–5.1)
Sodium: 139 mmol/L (ref 135–145)
Total Bilirubin: 0.7 mg/dL (ref 0.3–1.2)
Total Protein: 5.4 g/dL — ABNORMAL LOW (ref 6.5–8.1)

## 2020-11-29 LAB — CBC WITH DIFFERENTIAL/PLATELET
Abs Immature Granulocytes: 0.02 10*3/uL (ref 0.00–0.07)
Basophils Absolute: 0 10*3/uL (ref 0.0–0.1)
Basophils Relative: 0 %
Eosinophils Absolute: 0.4 10*3/uL (ref 0.0–0.5)
Eosinophils Relative: 6 %
HCT: 24 % — ABNORMAL LOW (ref 39.0–52.0)
Hemoglobin: 8.1 g/dL — ABNORMAL LOW (ref 13.0–17.0)
Immature Granulocytes: 0 %
Lymphocytes Relative: 20 %
Lymphs Abs: 1.4 10*3/uL (ref 0.7–4.0)
MCH: 31.2 pg (ref 26.0–34.0)
MCHC: 33.8 g/dL (ref 30.0–36.0)
MCV: 92.3 fL (ref 80.0–100.0)
Monocytes Absolute: 0.6 10*3/uL (ref 0.1–1.0)
Monocytes Relative: 9 %
Neutro Abs: 4.8 10*3/uL (ref 1.7–7.7)
Neutrophils Relative %: 65 %
Platelets: 124 10*3/uL — ABNORMAL LOW (ref 150–400)
RBC: 2.6 MIL/uL — ABNORMAL LOW (ref 4.22–5.81)
RDW: 14.6 % (ref 11.5–15.5)
WBC: 7.3 10*3/uL (ref 4.0–10.5)
nRBC: 0 % (ref 0.0–0.2)

## 2020-11-29 LAB — GLUCOSE, CAPILLARY
Glucose-Capillary: 84 mg/dL (ref 70–99)
Glucose-Capillary: 138 mg/dL — ABNORMAL HIGH (ref 70–99)
Glucose-Capillary: 105 mg/dL — ABNORMAL HIGH (ref 70–99)
Glucose-Capillary: 169 mg/dL — ABNORMAL HIGH (ref 70–99)

## 2020-11-29 LAB — C-REACTIVE PROTEIN: CRP: 2.9 mg/dL — ABNORMAL HIGH (ref <1.0)

## 2020-11-29 LAB — FERRITIN: Ferritin: 911 ng/mL — ABNORMAL HIGH (ref 24–336)

## 2020-11-29 LAB — MAGNESIUM: Magnesium: 2.1 mg/dL (ref 1.7–2.4)

## 2020-11-29 LAB — D-DIMER, QUANTITATIVE: D-Dimer, Quant: 1.55 ug/mL-FEU — ABNORMAL HIGH (ref 0.00–0.50)

## 2020-11-29 LAB — PHOSPHORUS: Phosphorus: 5.8 mg/dL — ABNORMAL HIGH (ref 2.5–4.6)

## 2020-11-29 MED ORDER — ISOSORBIDE MONONITRATE ER 30 MG PO TB24
30.0000 mg | ORAL_TABLET | Freq: Every day | ORAL | Status: DC
  Administered 2020-11-29 – 2020-11-30 (×2): 30 mg via ORAL
  Filled 2020-11-29 (×2): qty 1

## 2020-11-29 MED ORDER — BISOPROLOL FUMARATE 5 MG PO TABS
5.0000 mg | ORAL_TABLET | Freq: Every day | ORAL | Status: DC
  Administered 2020-11-29 – 2020-11-30 (×2): 5 mg via ORAL
  Filled 2020-11-29 (×2): qty 1

## 2020-11-29 MED ORDER — HEPARIN SODIUM (PORCINE) 5000 UNIT/ML IJ SOLN
5000.0000 [IU] | Freq: Three times a day (TID) | INTRAMUSCULAR | Status: DC
  Administered 2020-11-29 – 2020-11-30 (×3): 5000 [IU] via SUBCUTANEOUS
  Filled 2020-11-29 (×2): qty 1

## 2020-11-29 MED ORDER — DILTIAZEM HCL 30 MG PO TABS
60.0000 mg | ORAL_TABLET | Freq: Four times a day (QID) | ORAL | Status: DC
  Administered 2020-11-29 – 2020-11-30 (×4): 60 mg via ORAL
  Filled 2020-11-29 (×4): qty 2

## 2020-11-29 MED ORDER — TORSEMIDE 20 MG PO TABS: 40.0000 mg | ORAL_TABLET | Freq: Every day | ORAL | Status: DC

## 2020-11-29 MED ORDER — ALBUTEROL SULFATE HFA 108 (90 BASE) MCG/ACT IN AERS
2.0000 | INHALATION_SPRAY | Freq: Four times a day (QID) | RESPIRATORY_TRACT | Status: DC | PRN
  Filled 2020-11-29: qty 6.7

## 2020-11-29 MED ORDER — CLONIDINE HCL 0.1 MG PO TABS: 0.2000 mg | ORAL_TABLET | Freq: Every day | ORAL | Status: DC

## 2020-11-29 MED ORDER — DILTIAZEM HCL 30 MG PO TABS
30.0000 mg | ORAL_TABLET | Freq: Once | ORAL | Status: AC
  Administered 2020-11-29: 30 mg via ORAL
  Filled 2020-11-29: qty 1

## 2020-11-29 NOTE — Progress Notes (Signed)
Per provider, she will increase his cardizem. He cannot get all the meds from home because of the cardizem

## 2020-11-29 NOTE — TOC Initial Note (Signed)
Transition of Care [facility]) - Initial/Assessment Note    Patient Details  Name: [name] MRN: [id number] Date of Birth: 1945/12/18  Transition of Care [facility] CM/SW Contact:    Shelbie Ammons, RN Phone Number: 11/29/2020, 12:45 PM  Clinical Narrative:                  RNCM spoke with patient by phone due to isolation status to complete admission screen. Patient lives at home and still completes PD at home. He reports that he still drives himself to appointments unless his family does and does not have any trouble obtaining his medications. He reports no further needs at this time.   Expected Discharge Plan: Home/Self Care Barriers to Discharge: Continued Medical Work up   Patient Goals and CMS Choice        Expected Discharge Plan and Services Expected Discharge Plan: Home/Self Care       Living arrangements for the past 2 months: Single Family Home                                      Prior Living Arrangements/Services Living arrangements for the past 2 months: Single Family Home   Patient language and need for interpreter reviewed:: Yes Do you feel safe going back to the place where you live?: Yes      Need for Family Participation in Patient Care: Yes (Comment) Care giver support system in place?: Yes (comment)   Criminal Activity/Legal Involvement Pertinent to Current Situation/Hospitalization: No - Comment as needed  Activities of Daily Living Home Assistive Devices/Equipment: Other (Comment) (dialysis machine) ADL Screening (condition at time of admission) Patient's cognitive ability adequate to safely complete daily activities?: Yes Is the patient deaf or have difficulty hearing?: No Does the patient have difficulty seeing, even when wearing glasses/contacts?: No Does the patient have difficulty concentrating, remembering, or making decisions?: No Patient able to express need for assistance with ADLs?: Yes Does the patient have difficulty dressing or  bathing?: No Independently performs ADLs?: Yes (appropriate for developmental age) Does the patient have difficulty walking or climbing stairs?: No Weakness of Legs: None Weakness of Arms/Hands: None  Permission Sought/Granted                  Emotional Assessment       Orientation: : Oriented to Self, Oriented to Place, Oriented to  Time, Oriented to Situation      Admission diagnosis:  Atrial fibrillation with rapid ventricular response ([place]) [I48.91] ESRD on peritoneal dialysis ([place]) [N18.6, Z99.2] COVID-19 virus infection [U07.1] Patient Active Problem List   Diagnosis Date Noted   Atrial fibrillation with rapid ventricular response ([place]) 11/28/2020   COVID-19 virus infection 11/28/2020   Vitamin D deficiency 11/23/2020   Rectal bleeding 11/06/2020   Pneumoperitoneum 11/06/2020   HTN (hypertension) 11/06/2020   Type II diabetes mellitus with renal manifestations ([place]) 11/06/2020   CAD (coronary artery disease) 11/06/2020   Anemia in ESRD (end-stage renal disease) ([place]) 11/06/2020   Anemia due to blood loss 11/06/2020   Hypotension, iatrogenic 08/20/2020   ESRD on peritoneal dialysis ([place]) 08/06/2020   HFrEF (heart failure with reduced ejection fraction) ([place])    Anemia of chronic disease    Chronic HFrEF (heart failure with reduced ejection fraction) ([place]) 05/11/2020   Fluid overload 05/11/2020   End stage renal disease ([place]) 02/19/2020   Aortic atherosclerosis ([place]) 01/22/2020  Hospital discharge follow-up 01/22/2020   Neurologic deficit as late effect of ischemic cerebrovascular accident (CVA) 01/15/2020   Dissection of left subclavian artery ([place])    Cerebrovascular accident (CVA) ([place]) 01/14/2020   Constipation 05/13/2019   Cardiomyopathy ([place]) 04/26/2019   Coronary artery disease involving native coronary artery of native heart without angina pectoris 04/26/2019   ESRD (end stage renal disease) ([place]) 04/26/2019   Chronic pain not due to malignancy  04/12/2019   Chronic bilateral low back pain with bilateral sciatica 11/27/2018   Synovial cyst of lumbar spine 11/27/2018   Uncontrolled hypertension 11/14/2018   CKD (chronic kidney disease), stage V ([place]) 11/14/2018   Major depressive disorder with current active episode 07/26/2017   Anemia of chronic kidney failure, stage 4 (severe) ([place]) 07/26/2017   DOE (dyspnea on exertion) 11/25/2016   S/P UVPP (uvulopalatopharyngoplasty) 08/22/2014   Presbyacusis 08/22/2014   Adenocarcinoma, renal cell ([place]) 02/26/2014   Renal cell carcinoma ([place]) 02/26/2014   Malignant neoplasm of kidney excluding renal pelvis (HCC) 02/26/2014   Prostate CA ([place]) 07/30/2013   Prostate cancer ([place]) 07/30/2013   Elevated serum alkaline phosphatase level 07/30/2013   Malignant neoplasm of prostate ([place]) 07/30/2013   Hypertensive renal sclerosis with hypertension 10/18/2012   Renal sclerosis with hypertension 03/55/9741   Complication of diabetes mellitus ([place]) 07/16/2012   PAD (peripheral artery disease) ([place])    Left hip pain 04/20/2012   Erectile dysfunction 04/20/2012   Hip pain 04/20/2012   tobacco abuse    Temporary cerebral vascular dysfunction 01/15/2012   Tobacco abuse counseling 05/06/2011   Counseling on substance use and abuse 05/06/2011   Hyperlipidemia    History of renal cell carcinoma    PCP:  Crecencio Mc, MD Pharmacy:   [facility], [place] [place] [address] Phone: [phone] Fax: [phone]     Social Determinants of Health (SDOH) Interventions    Readmission Risk Interventions Readmission Risk Prevention Plan 11/29/2020  Transportation Screening Complete  Medication Review ([place]) Complete  PCP or Specialist appointment within 3-5 days of discharge Complete  Palliative Care Screening Not [place] Not Applicable  Some recent data might be hidden

## 2020-11-29 NOTE — Progress Notes (Signed)
Frequent PVC's . 3 beats of PVC called at midnight by central tele for events happening at 9:30pm . Communicated with [facility] night coverage

## 2020-11-29 NOTE — Progress Notes (Signed)
[name] Progress Note  Patient: [name]    LKG:[id number]  DOA: 11/28/2020     Date of Service: the patient was seen and examined on 11/29/2020  Chief Complaint  Patient presents with   Weakness   Brief hospital course:  [name] is a 75 y.o. male with medical history significant for diabetes mellitus with complications of end-stage renal disease on peritoneal dialysis, atrial fibrillation not on anticoagulation due to bleeding, history of chronic systolic CHF, history of TIA, obstructive sleep apnea who presents to the ER via EMS for evaluation of weakness and dizziness for 2 days.  Patient states that he has been very weak and unable to get out of bed he also complains of myalgias, shortness of breath at rest, fever, cough productive of clear phlegm, anorexia and diarrhea. When EMS arrived he was found to be in rapid A. fib with heart rate between 160 and 170 bpm.  He received a dose of metoprolol 5 mg IV with improvement in his heart rate to 130. He denies having any chest pain, no abdominal pain, no constipation, no diaphoresis, no headache, no blurred vision, no focal deficits. Labs show sodium 138, potassium 3.5, chloride 105, bicarb 21, glucose 126, BUN 76, creatinine 7.8, calcium 7.9, lactic acid 0.8, procalcitonin 0.91, white count 9.8, hemoglobin 9.4, hematocrit 27.7, MCV 91.7, RDW 14.8, platelet count 170 Patient SARS coronavirus 2 point-of-care test positive Chest x-ray reviewed by me shows enlargement of the cardiac silhouette.  Chronic interstitial markings without infiltrate.  Emphysema. Twelve-lead EKG reviewed by me shows atrial fibrillation with a rapid ventricular rate.     ED Course: Patient is a 75 year old male who presents to the ER via EMS for evaluation of weakness.  When EMS arrived he was noted to be tachycardic and in rapid atrial fibrillation with heart rate of about 160 bpm. He received a dose of metoprolol 5 mg IV with slight improvement in his  heart rate and was started on a Cardizem drip in the emergency room. Patient's SARS coronavirus 2 point-of-care test was positive.  He received 1 dose of the Moderna vaccine and denies having any sick contacts. He received a dose of remdesivir in the ER and will be admitted to the hospital for further evaluation.  Assessment and Plan: Principal Problem:   Atrial fibrillation with rapid ventricular response (HCC) Active Problems:   HTN (hypertension)   tobacco abuse   ESRD on peritoneal dialysis ([place])   Anemia in ESRD (end-stage renal disease) ([place])   COVID-19 virus infection       # Atrial fibrillation with rapid ventricular response Patient has a history of atrial fibrillation not on long-term anticoagulation due to history of bleeding He presents to the ER for evaluation of weakness and is found to be in rapid A. Fib S/p Cardizem infusion, converted back to normal sinus rhythm, rate is under control now  Started oral Cardizem and discontinue amlodipine for rate control Resumed bisoprolol and clonidine.     COVID-19 viral infection Patient with complaints of fever, chills, myalgias, anorexia, shortness of breath and diarrhea. His point-of-care COVID-19 test is positive He received 1 dose of the Moderna vaccine but denies having any sick contacts S/p remdesivir x 1 dose, patient is asymptomatic, no need to complete the treatment Supportive care with antitussives, as needed bronchodilator therapy and vitamins. Patient is not hypoxic on room air at rest his pulse oximetry is 95% Check pulse oximetry every shift No need for systemic steroids at  this time Monitor inflammatory markers      Diabetes mellitus with complications of end-stage renal disease on peritoneal dialysis. consulted nephrology for renal replacement therapy Diabetes is diet-controlled Check blood sugars before meals and at bedtime     Nicotine dependence Smoking cessation has been discussed with patient in  detail Will place patient on nicotine transdermal patch 21 mg daily     Chronic systolic heart failure Hold torsemide and Avapro due to relative hypotension     Anemia end-stage renal disease H&H is stable   Body mass index is 23.34 kg/m.  Interventions:      Diet: Renal diet DVT Prophylaxis: Subcutaneous Heparin    Advance goals of care discussion: Full code  Family Communication: family was present at bedside, at the time of interview.  The pt provided permission to discuss medical plan with the family. Opportunity was given to ask question and all questions were answered satisfactorily.   Disposition:  Pt is from Home, admitted with A. fib with RVR and COVID viral infection, gradually improving, we will continue to monitor on telemetry tonight. Discharge to home most likely tomorrow if remains stable  Subjective: Significant overnight events, patient was admitted due to A. fib RVR, converted back to normal sinus rhythm now.  We will continue to monitor.  Patient denies any chest pain.  No shortness of breath. Peritoneal dialysis was not done last night so nephrology will do peritoneal dialysis tonight, we will continue to monitor and plan for disposition tomorrow a.m.   Physical Exam: General:  alert oriented to time, place, and person.  Appear in no distress, affect appropriate Eyes: PERRLA ENT: Oral Mucosa Clear, moist  Neck: no JVD,  Cardiovascular: S1 and S2 Present, no Murmur,  Respiratory: good respiratory effort, Bilateral Air entry equal and Decreased, no Crackles, no wheezes Abdomen: Bowel Sound present, Soft and no tenderness,  Skin: no rashes Extremities: no Pedal edema, no calf tenderness Neurologic: without any new focal findings Gait not checked due to patient safety concerns  Vitals:   11/29/20 0300 11/29/20 0500 11/29/20 0853 11/29/20 1100  BP: (!) 167/61  (!) 170/80 (!) 178/69  Pulse: 71  74 72  Resp: (!) 22  (!) 21 17  Temp: (!) 97.3 F (36.3  C) 97.6 F (36.4 C) 98.1 F (36.7 C) 98 F (36.7 C)  TempSrc:   Oral Oral  SpO2: 100%  98% 100%  Weight:  65.6 kg    Height:        Intake/Output Summary (Last 24 hours) at 11/29/2020 1458 Last data filed at 11/29/2020 1135 Gross per 24 hour  Intake 44.67 ml  Output 1475 ml  Net -1430.33 ml   Filed Weights   11/28/20 0739 11/29/20 0500  Weight: 63.5 kg 65.6 kg    Data Reviewed: I have personally reviewed and interpreted daily labs, tele strips, imagings as discussed above. I reviewed all nursing notes, pharmacy notes, vitals, pertinent old records I have discussed plan of care as described above with RN and patient/family.  CBC: Recent Labs  Lab 11/28/20 0745 11/29/20 0634  WBC 9.8 7.3  NEUTROABS  --  4.8  HGB 9.4* 8.1*  HCT 27.7* 24.0*  MCV 91.7 92.3  PLT 170 062*   Basic Metabolic Panel: Recent Labs  Lab 11/28/20 0745 11/29/20 0634  NA 138 139  K 3.5 3.3*  CL 105 108  CO2 21* 23  GLUCOSE 126* 89  BUN 76* 66*  CREATININE 7.88* 7.41*  CALCIUM 7.9* 8.0*  MG  --  2.1  PHOS  --  5.8*    Studies: No results found.  Scheduled Meds:  vitamin C  500 mg Oral Daily   aspirin EC  81 mg Oral Daily   B-complex with vitamin C  1 tablet Oral Daily   bisoprolol  5 mg Oral Daily   calcitRIOL  0.25 mcg Oral Q M,W,F   cloNIDine  0.1 mg Transdermal Q Fri   diltiazem  15 mg Intravenous Once   diltiazem  60 mg Oral Q6H   doxazosin  8 mg Oral Daily   gentamicin cream  1 application Topical Daily   isosorbide mononitrate  30 mg Oral Daily   multivitamin  1 tablet Oral QHS   nicotine  21 mg Transdermal Daily   rosuvastatin  20 mg Oral Daily   sevelamer carbonate  800 mg Oral TID WC   sodium chloride flush  3 mL Intravenous Q12H   Vitamin D (Ergocalciferol)  50,000 Units Oral Q14 Days   zinc sulfate  220 mg Oral Daily   Continuous Infusions:  sodium chloride     dialysis solution 1.5% low-MG/low-CA     diltiazem (CARDIZEM) infusion 10 mg/hr (11/28/20 1437)   PRN  Meds: sodium chloride, acetaminophen, albuterol, fluticasone, guaiFENesin-dextromethorphan, heparin, ondansetron (ZOFRAN) IV, sodium chloride flush, tiZANidine  Time spent: 35 minutes  Author: Val Riles. MD [facility] 11/29/2020 2:58 PM  To reach On-call, see care teams to locate the attending and reach out to them via [email or web address]. If 7PM-7AM, please contact night-coverage If you still have difficulty reaching the attending provider, please page the [facility] (Director on Call) for [name] on amion for assistance.

## 2020-11-29 NOTE — Progress Notes (Signed)
Pt PD tx disconnected at 0815. Last fill was not complete. Pt PD catheter cleaned and clamped. Pt has no c/o pain or discomfort. Education complete. Pt does not have a time preference to resume PD. Uf 61ml.

## 2020-11-29 NOTE — Progress Notes (Signed)
[facility]  ROUNDING NOTE   Subjective:   Wife at bedside.   Patient had some difficulty with peritoneal dialysis last night.   He states he is breathing better and reports no chest pain.   Objective:  Vital signs in last 24 hours:  Temp:  [97.3 F (36.3 C)-98.3 F (36.8 C)] 98 F (36.7 C) (07/09 1100) Pulse Rate:  [62-101] 72 (07/09 1100) Resp:  [17-23] 17 (07/09 1100) BP: (145-195)/(61-94) 178/69 (07/09 1100) SpO2:  [95 %-100 %] 100 % (07/09 1100) Weight:  [65.6 kg] 65.6 kg (07/09 0500)  Weight change:  Filed Weights   11/28/20 0739 11/29/20 0500  Weight: 63.5 kg 65.6 kg    Intake/Output: I/O last 3 completed shifts: In: 1044.7 [I.V.:544.7; IV Piggyback:500] Out: 1610 [RUEAV:4098]   Intake/Output this shift:  Total I/O In: -  Out: 100 [Urine:100]  Physical Exam: General: NAD, resting in bed  Head: Normocephalic, atraumatic. Moist oral mucosal membranes  Eyes: Anicteric  Lungs:  Clear to auscultation, normal effort  Heart: Irregular rhythm  Abdomen:  Soft, nontender  Extremities:  no peripheral edema.  Neurologic: Alert, moving all four extremities  Skin: No lesions  Access: PD catheter    Basic Metabolic Panel: Recent Labs  Lab 11/28/20 0745 11/29/20 0634  NA 138 139  K 3.5 3.3*  CL 105 108  CO2 21* 23  GLUCOSE 126* 89  BUN 76* 66*  CREATININE 7.88* 7.41*  CALCIUM 7.9* 8.0*  MG  --  2.1  PHOS  --  5.8*     Liver Function Tests: Recent Labs  Lab 11/29/20 0634  AST 22  ALT 18  ALKPHOS 83  BILITOT 0.7  PROT 5.4*  ALBUMIN 2.5*   No results for input(s): LIPASE, AMYLASE in the last 168 hours. No results for input(s): AMMONIA in the last 168 hours.  CBC: Recent Labs  Lab 11/28/20 0745 11/29/20 0634  WBC 9.8 7.3  NEUTROABS  --  4.8  HGB 9.4* 8.1*  HCT 27.7* 24.0*  MCV 91.7 92.3  PLT 170 124*     Cardiac Enzymes: No results for input(s): CKTOTAL, CKMB, CKMBINDEX, TROPONINI in the last 168  hours.  BNP: Invalid input(s): POCBNP  CBG: Recent Labs  Lab 11/28/20 1706 11/28/20 2330 11/29/20 0853 11/29/20 1130  GLUCAP 102* 144* 84 138*    Microbiology: Results for orders placed or performed during the hospital encounter of 11/28/20  Blood culture (single)     Status: None (Preliminary result)   Collection Time: 11/28/20  7:47 AM   Specimen: BLOOD  Result Value Ref Range Status   Specimen Description BLOOD LEFT ANTECUBITAL  Final   Special Requests   Final    BOTTLES DRAWN AEROBIC AND ANAEROBIC Blood Culture adequate volume   Culture   Final    NO GROWTH < 24 HOURS Performed at [facility], [address], [place], [place] [address]    Report Status PENDING  Incomplete  Resp Panel by RT-PCR (Flu A&B, Covid) Nasopharyngeal Swab     Status: Abnormal   Collection Time: 11/28/20  8:01 AM   Specimen: Nasopharyngeal Swab; Nasopharyngeal(NP) swabs in vial transport medium  Result Value Ref Range Status   SARS Coronavirus 2 by RT PCR POSITIVE (A) NEGATIVE Final    Comment: RESULT CALLED TO, READ BACK BY AND VERIFIED WITH: Charlotte Crumb, RN @0924  ON 07.08.22 (NOTE) SARS-CoV-2 target nucleic acids are DETECTED.  The SARS-CoV-2 RNA is generally detectable in upper respiratory specimens during the acute phase  of infection. Positive results are indicative of the presence of the identified virus, but do not rule out bacterial infection or co-infection with other pathogens not detected by the test. Clinical correlation with patient history and other diagnostic information is necessary to determine patient infection status. The expected result is Negative.  Fact Sheet for Patients: EntrepreneurPulse.com.au  Fact Sheet for Healthcare Providers: IncredibleEmployment.be  This test is not yet approved or cleared by the [place] FDA and  has been authorized for detection and/or diagnosis of SARS-CoV-2 by FDA under an  Emergency Use Authorization (EUA).  This EUA will remain in effect (meaning this test can  be used) for the duration of  the COVID-19 declaration under Section 564(b)(1) of the Act, 21 U.S.C. section 360bbb-3(b)(1), unless the authorization is terminated or revoked sooner.     Influenza A by PCR NEGATIVE NEGATIVE Final   Influenza B by PCR NEGATIVE NEGATIVE Final    Comment: (NOTE) The Xpert Xpress SARS-CoV-2/FLU/RSV plus assay is intended as an aid in the diagnosis of influenza from Nasopharyngeal swab specimens and should not be used as a sole basis for treatment. Nasal washings and aspirates are unacceptable for Xpert Xpress SARS-CoV-2/FLU/RSV testing.  Fact Sheet for Patients: EntrepreneurPulse.com.au  Fact Sheet for Healthcare Providers: IncredibleEmployment.be  This test is not yet approved or cleared by the [place] FDA and has been authorized for detection and/or diagnosis of SARS-CoV-2 by FDA under an Emergency Use Authorization (EUA). This EUA will remain in effect (meaning this test can be used) for the duration of the COVID-19 declaration under Section 564(b)(1) of the Act, 21 U.S.C. section 360bbb-3(b)(1), unless the authorization is terminated or revoked.  Performed at [facility], [address] [place], [place] [address]     Coagulation Studies: No results for input(s): LABPROT, INR in the last 72 hours.  Urinalysis: Recent Labs    11/28/20 1055  COLORURINE STRAW*  LABSPEC 1.008  PHURINE 7.0  GLUCOSEU 50*  HGBUR NEGATIVE  BILIRUBINUR NEGATIVE  KETONESUR NEGATIVE  PROTEINUR 30*  NITRITE NEGATIVE  LEUKOCYTESUR NEGATIVE       Imaging: DG Chest Portable 1 View  Result Date: 11/28/2020 CLINICAL DATA:  Cough, chills, weakness, history atrial fibrillation, renal cell carcinoma, and seal carcinoma, CHF, cardiomyopathy, diabetes mellitus, end-stage renal disease, hypertension, smoker EXAM: PORTABLE  CHEST 1 VIEW COMPARISON:  Portable exam 0814 hours compared to 08/07/2020 FINDINGS: Enlargement of cardiac silhouette. Mediastinal contours and pulmonary vascularity normal. Atherosclerotic calcification aorta. Emphysematous and minimal bronchitic changes consistent with COPD. Minimal chronic interstitial prominence. No acute infiltrate, pleural effusion, or pneumothorax. Bones demineralized. IMPRESSION: Enlargement of cardiac silhouette. Chronic accentuation of interstitial markings without acute infiltrate. Aortic Atherosclerosis (ICD10-I70.0) and Emphysema (ICD10-J43.9). Electronically Signed   By: Lavonia Dana M.D.   On: 11/28/2020 08:43     Medications:    sodium chloride     dialysis solution 1.5% low-MG/low-CA     diltiazem (CARDIZEM) infusion 10 mg/hr (11/28/20 1437)    vitamin C  500 mg Oral Daily   aspirin EC  81 mg Oral Daily   B-complex with vitamin C  1 tablet Oral Daily   bisoprolol  5 mg Oral Daily   calcitRIOL  0.25 mcg Oral Q M,W,F   cloNIDine  0.1 mg Transdermal Q Fri   diltiazem  15 mg Intravenous Once   diltiazem  60 mg Oral Q6H   doxazosin  8 mg Oral Daily   gentamicin cream  1 application Topical Daily   isosorbide mononitrate  30 mg Oral Daily   multivitamin  1 tablet Oral QHS   nicotine  21 mg Transdermal Daily   rosuvastatin  20 mg Oral Daily   sevelamer carbonate  800 mg Oral TID WC   sodium chloride flush  3 mL Intravenous Q12H   Vitamin D (Ergocalciferol)  50,000 Units Oral Q14 Days   zinc sulfate  220 mg Oral Daily   sodium chloride, acetaminophen, albuterol, fluticasone, guaiFENesin-dextromethorphan, heparin, ondansetron (ZOFRAN) IV, sodium chloride flush, tiZANidine  Assessment/ Plan:  [name] is a 76 y.o. white male with end stage renal disease on peritoneal dialysis, diabetes mellitus type II, atrial fibrillation, congestive heart failure, coronary artery diease, COPD with ongoing tobacco use, sleep apnea who is admitted to [facility] on 11/28/2020  for Atrial fibrillation with rapid ventricular response ([place]) [I48.91] ESRD on peritoneal dialysis ([place]) [N18.6, Z99.2] COVID-19 virus infection [U07.1]  [facility] CCPD 6 hours 3 cycles 2 Liter fills  End stage renal disease on peritoneal dialysis Resume home prescription  2. Anemia of chronic kidney disease  Lab Results  Component Value Date   HGB 8.1 (L) 11/29/2020   EPO and Venofer outpatient   3. Secondary Hyperparathyroidism:  Lab Results  Component Value Date   PTH 315 (H) 05/11/2017   CALCIUM 8.0 (L) 11/29/2020   PHOS 5.8 (H) 11/29/2020   Continue sevelamer with meals Continue calcitriol  4. Diabetes mellitus type II with chronic kidney disease Noninsulin dependent. Most recent hemoglobin A1c is 6.9%  5. Hypertension: difficult to control. Home regimen is amlodipine, bisoprolol, clonidine, doxazosin, irbesartan and torsemide.     LOS: 1 Maretta Overdorf 7/9/20222:14 PM

## 2020-11-29 NOTE — Progress Notes (Signed)
Discussed hypertension concerns with on call [facility] provider

## 2020-11-29 NOTE — Progress Notes (Signed)
Patient came up from ER with cardizem drip running at 10mg /hr. He is in a sinus rhythm currently at a rate of 60. To prevent the heart rate from dropping to low the drip was titrated off at 1900. The night shift RN was given report and told of the above. Patient had PO meds ordered but were not yet available for me to pull from the pyxis and the night shift RN was made aware of this as well. Patient has no complaints at this time.

## 2020-11-30 DIAGNOSIS — N186 End stage renal disease: Secondary | ICD-10-CM | POA: Diagnosis not present

## 2020-11-30 DIAGNOSIS — D509 Iron deficiency anemia, unspecified: Secondary | ICD-10-CM | POA: Diagnosis not present

## 2020-11-30 DIAGNOSIS — D631 Anemia in chronic kidney disease: Secondary | ICD-10-CM | POA: Diagnosis not present

## 2020-11-30 DIAGNOSIS — N2581 Secondary hyperparathyroidism of renal origin: Secondary | ICD-10-CM | POA: Diagnosis not present

## 2020-11-30 DIAGNOSIS — Z992 Dependence on renal dialysis: Secondary | ICD-10-CM | POA: Diagnosis not present

## 2020-11-30 LAB — COMPREHENSIVE METABOLIC PANEL
ALT: 15 U/L (ref 0–44)
AST: 16 U/L (ref 15–41)
Albumin: 2.4 g/dL — ABNORMAL LOW (ref 3.5–5.0)
Alkaline Phosphatase: 77 U/L (ref 38–126)
Anion gap: 7 (ref 5–15)
BUN: 61 mg/dL — ABNORMAL HIGH (ref 8–23)
CO2: 24 mmol/L (ref 22–32)
Calcium: 8.1 mg/dL — ABNORMAL LOW (ref 8.9–10.3)
Chloride: 109 mmol/L (ref 98–111)
Creatinine, Ser: 7.31 mg/dL — ABNORMAL HIGH (ref 0.61–1.24)
GFR, Estimated: 7 mL/min — ABNORMAL LOW (ref >60)
Glucose, Bld: 97 mg/dL (ref 70–99)
Potassium: 3.7 mmol/L (ref 3.5–5.1)
Sodium: 140 mmol/L (ref 135–145)
Total Bilirubin: 0.6 mg/dL (ref 0.3–1.2)
Total Protein: 5.1 g/dL — ABNORMAL LOW (ref 6.5–8.1)

## 2020-11-30 LAB — D-DIMER, QUANTITATIVE: D-Dimer, Quant: 1.36 ug{FEU}/mL — ABNORMAL HIGH (ref 0.00–0.50)

## 2020-11-30 LAB — CBC WITH DIFFERENTIAL/PLATELET
Abs Immature Granulocytes: 0.02 10*3/uL (ref 0.00–0.07)
Basophils Absolute: 0 10*3/uL (ref 0.0–0.1)
Basophils Relative: 1 %
Eosinophils Absolute: 0.3 10*3/uL (ref 0.0–0.5)
Eosinophils Relative: 6 %
HCT: 23.2 % — ABNORMAL LOW (ref 39.0–52.0)
Hemoglobin: 7.9 g/dL — ABNORMAL LOW (ref 13.0–17.0)
Immature Granulocytes: 0 %
Lymphocytes Relative: 25 %
Lymphs Abs: 1.5 10*3/uL (ref 0.7–4.0)
MCH: 31 pg (ref 26.0–34.0)
MCHC: 34.1 g/dL (ref 30.0–36.0)
MCV: 91 fL (ref 80.0–100.0)
Monocytes Absolute: 0.5 10*3/uL (ref 0.1–1.0)
Monocytes Relative: 8 %
Neutro Abs: 3.4 10*3/uL (ref 1.7–7.7)
Neutrophils Relative %: 60 %
Platelets: 126 10*3/uL — ABNORMAL LOW (ref 150–400)
RBC: 2.55 MIL/uL — ABNORMAL LOW (ref 4.22–5.81)
RDW: 14.6 % (ref 11.5–15.5)
WBC: 5.7 10*3/uL (ref 4.0–10.5)
nRBC: 0 % (ref 0.0–0.2)

## 2020-11-30 LAB — FERRITIN: Ferritin: 1007 ng/mL — ABNORMAL HIGH (ref 24–336)

## 2020-11-30 LAB — C-REACTIVE PROTEIN: CRP: 3.2 mg/dL — ABNORMAL HIGH

## 2020-11-30 LAB — PHOSPHORUS: Phosphorus: 6.1 mg/dL — ABNORMAL HIGH (ref 2.5–4.6)

## 2020-11-30 LAB — GLUCOSE, CAPILLARY
Glucose-Capillary: 95 mg/dL (ref 70–99)
Glucose-Capillary: 166 mg/dL — ABNORMAL HIGH (ref 70–99)

## 2020-11-30 LAB — MAGNESIUM: Magnesium: 2.2 mg/dL (ref 1.7–2.4)

## 2020-11-30 MED ORDER — AMLODIPINE BESYLATE 10 MG PO TABS: 10.0000 mg | ORAL_TABLET | Freq: Every day | ORAL | Status: DC

## 2020-11-30 MED ORDER — IRBESARTAN 75 MG PO TABS
75.0000 mg | ORAL_TABLET | Freq: Every day | ORAL | Status: DC
  Administered 2020-11-30: 75 mg via ORAL
  Filled 2020-11-30: qty 1

## 2020-11-30 MED ORDER — TORSEMIDE 20 MG PO TABS: 100.0000 mg | ORAL_TABLET | Freq: Every day | ORAL | Status: DC

## 2020-11-30 MED ORDER — DILTIAZEM HCL ER COATED BEADS 180 MG PO CP24: 180.0000 mg | ORAL_CAPSULE | Freq: Every day | ORAL | 11 refills | Status: DC

## 2020-11-30 NOTE — Progress Notes (Signed)
Pt PD disconnected w/ no complications. UF 887ml removed. Pt has no complaints and is planning for discharge to home today. PD catheter was cleaned and capped.

## 2020-11-30 NOTE — Progress Notes (Signed)
[facility]  ROUNDING NOTE   Subjective:   Peritoneal dialysis last night. Tolerated treatment well.   Denies any chest pain, shortness of breath or palpitations.   Objective:  Vital signs in last 24 hours:  Temp:  [98 F (36.7 C)-98.2 F (36.8 C)] 98.1 F (36.7 C) (07/10 0400) Pulse Rate:  [55-67] 56 (07/10 0400) Resp:  [11-21] 20 (07/10 0400) BP: (139-177)/(49-77) 177/77 (07/10 0720) SpO2:  [94 %-97 %] 94 % (07/10 0400) Weight:  [65.5 kg] 65.5 kg (07/10 0400)  Weight change: 1.996 kg Filed Weights   11/28/20 0739 11/29/20 0500 11/30/20 0400  Weight: 63.5 kg 65.6 kg 65.5 kg    Intake/Output: I/O last 3 completed shifts: In: 240 [P.O.:240] Out: 1450 [Urine:1450]   Intake/Output this shift:  No intake/output data recorded.  Physical Exam: General: NAD, in bed  Head: Normocephalic, atraumatic. Moist oral mucosal membranes  Eyes: Anicteric  Lungs:  Clear to auscultation, normal effort  Heart: regular  Abdomen:  Soft, nontender  Extremities:  no peripheral edema.  Neurologic: Alert, moving all four extremities  Skin: No lesions  Access: PD catheter    Basic Metabolic Panel: Recent Labs  Lab 11/28/20 0745 11/29/20 0634 11/30/20 0529  NA 138 139 140  K 3.5 3.3* 3.7  CL 105 108 109  CO2 21* 23 24  GLUCOSE 126* 89 97  BUN 76* 66* 61*  CREATININE 7.88* 7.41* 7.31*  CALCIUM 7.9* 8.0* 8.1*  MG  --  2.1 2.2  PHOS  --  5.8* 6.1*     Liver Function Tests: Recent Labs  Lab 11/29/20 0634 11/30/20 0529  AST 22 16  ALT 18 15  ALKPHOS 83 77  BILITOT 0.7 0.6  PROT 5.4* 5.1*  ALBUMIN 2.5* 2.4*    No results for input(s): LIPASE, AMYLASE in the last 168 hours. No results for input(s): AMMONIA in the last 168 hours.  CBC: Recent Labs  Lab 11/28/20 0745 11/29/20 0634 11/30/20 0529  WBC 9.8 7.3 5.7  NEUTROABS  --  4.8 3.4  HGB 9.4* 8.1* 7.9*  HCT 27.7* 24.0* 23.2*  MCV 91.7 92.3 91.0  PLT 170 124* 126*     Cardiac Enzymes: No  results for input(s): CKTOTAL, CKMB, CKMBINDEX, TROPONINI in the last 168 hours.  BNP: Invalid input(s): POCBNP  CBG: Recent Labs  Lab 11/29/20 0853 11/29/20 1130 11/29/20 1620 11/29/20 2039 11/30/20 0748  GLUCAP 84 138* 105* 169* 95     Microbiology: Results for orders placed or performed during the hospital encounter of 11/28/20  Blood culture (single)     Status: None (Preliminary result)   Collection Time: 11/28/20  7:47 AM   Specimen: BLOOD  Result Value Ref Range Status   Specimen Description BLOOD LEFT ANTECUBITAL  Final   Special Requests   Final    BOTTLES DRAWN AEROBIC AND ANAEROBIC Blood Culture adequate volume   Culture   Final    NO GROWTH 2 DAYS Performed at [facility], [address], [place], [place] [address]    Report Status PENDING  Incomplete  Resp Panel by RT-PCR (Flu A&B, Covid) Nasopharyngeal Swab     Status: Abnormal   Collection Time: 11/28/20  8:01 AM   Specimen: Nasopharyngeal Swab; Nasopharyngeal(NP) swabs in vial transport medium  Result Value Ref Range Status   SARS Coronavirus 2 by RT PCR POSITIVE (A) NEGATIVE Final    Comment: RESULT CALLED TO, READ BACK BY AND VERIFIED WITH: Charlotte Crumb, RN @0924  ON 07.08.22 (NOTE) SARS-CoV-2  target nucleic acids are DETECTED.  The SARS-CoV-2 RNA is generally detectable in upper respiratory specimens during the acute phase of infection. Positive results are indicative of the presence of the identified virus, but do not rule out bacterial infection or co-infection with other pathogens not detected by the test. Clinical correlation with patient history and other diagnostic information is necessary to determine patient infection status. The expected result is Negative.  Fact Sheet for Patients: EntrepreneurPulse.com.au  Fact Sheet for Healthcare Providers: IncredibleEmployment.be  This test is not yet approved or cleared by the [place] FDA and   has been authorized for detection and/or diagnosis of SARS-CoV-2 by FDA under an Emergency Use Authorization (EUA).  This EUA will remain in effect (meaning this test can  be used) for the duration of  the COVID-19 declaration under Section 564(b)(1) of the Act, 21 U.S.C. section 360bbb-3(b)(1), unless the authorization is terminated or revoked sooner.     Influenza A by PCR NEGATIVE NEGATIVE Final   Influenza B by PCR NEGATIVE NEGATIVE Final    Comment: (NOTE) The Xpert Xpress SARS-CoV-2/FLU/RSV plus assay is intended as an aid in the diagnosis of influenza from Nasopharyngeal swab specimens and should not be used as a sole basis for treatment. Nasal washings and aspirates are unacceptable for Xpert Xpress SARS-CoV-2/FLU/RSV testing.  Fact Sheet for Patients: EntrepreneurPulse.com.au  Fact Sheet for Healthcare Providers: IncredibleEmployment.be  This test is not yet approved or cleared by the [place] FDA and has been authorized for detection and/or diagnosis of SARS-CoV-2 by FDA under an Emergency Use Authorization (EUA). This EUA will remain in effect (meaning this test can be used) for the duration of the COVID-19 declaration under Section 564(b)(1) of the Act, 21 U.S.C. section 360bbb-3(b)(1), unless the authorization is terminated or revoked.  Performed at [facility], [address] [place], [place] [address]     Coagulation Studies: No results for input(s): LABPROT, INR in the last 72 hours.  Urinalysis: Recent Labs    11/28/20 1055  COLORURINE STRAW*  LABSPEC 1.008  PHURINE 7.0  GLUCOSEU 50*  HGBUR NEGATIVE  BILIRUBINUR NEGATIVE  KETONESUR NEGATIVE  PROTEINUR 30*  NITRITE NEGATIVE  LEUKOCYTESUR NEGATIVE       Imaging: No results found.   Medications:    sodium chloride     dialysis solution 1.5% low-MG/low-CA     diltiazem (CARDIZEM) infusion 10 mg/hr (11/28/20 1437)    vitamin C  500 mg  Oral Daily   aspirin EC  81 mg Oral Daily   B-complex with vitamin C  1 tablet Oral Daily   bisoprolol  5 mg Oral Daily   calcitRIOL  0.25 mcg Oral Q M,W,F   cloNIDine  0.1 mg Transdermal Q Fri   diltiazem  15 mg Intravenous Once   diltiazem  60 mg Oral Q6H   doxazosin  8 mg Oral Daily   gentamicin cream  1 application Topical Daily   heparin injection (subcutaneous)  5,000 Units Subcutaneous Q8H   irbesartan  75 mg Oral Daily   isosorbide mononitrate  30 mg Oral Daily   multivitamin  1 tablet Oral QHS   nicotine  21 mg Transdermal Daily   rosuvastatin  20 mg Oral Daily   sevelamer carbonate  800 mg Oral TID WC   sodium chloride flush  3 mL Intravenous Q12H   Vitamin D (Ergocalciferol)  50,000 Units Oral Q14 Days   zinc sulfate  220 mg Oral Daily   sodium chloride, acetaminophen, albuterol, fluticasone,  guaiFENesin-dextromethorphan, heparin, ondansetron (ZOFRAN) IV, sodium chloride flush, tiZANidine  Assessment/ Plan:  [name] is a 75 y.o. white male with end stage renal disease on peritoneal dialysis, diabetes mellitus type II, atrial fibrillation, congestive heart failure, coronary artery diease, COPD with ongoing tobacco use, sleep apnea who is admitted to [facility] on 11/28/2020 for Atrial fibrillation with rapid ventricular response ([place]) [I48.91] ESRD on peritoneal dialysis ([place]) [N18.6, Z99.2] COVID-19 virus infection [U07.1]  [facility] CCPD 6 hours 3 cycles 2 Liter fills  End stage renal disease on peritoneal dialysis - Continue Resume home prescription  2. Anemia of chronic kidney disease  Lab Results  Component Value Date   HGB 7.9 (L) 11/30/2020   EPO and Venofer outpatient   3. Secondary Hyperparathyroidism: with hyperphosphatemia and hypercalcemia.  Lab Results  Component Value Date   PTH 315 (H) 05/11/2017   CALCIUM 8.1 (L) 11/30/2020   PHOS 6.1 (H) 11/30/2020  Continue sevelamer with meals Continue calcitriol  4. Diabetes mellitus type  II with chronic kidney disease Noninsulin dependent. Hemoglobin A1c is 6.9% on 11/21/2020  5. Hypertension: difficult to control. 177/77. Home regimen is amlodipine, bisoprolol, clonidine, doxazosin, irbesartan and torsemide.  Restart torsemide and amlodipine    LOS: 2 Cressie Betzler 7/10/202211:04 AM

## 2020-11-30 NOTE — TOC Transition Note (Signed)
Transition of Care [facility]) - CM/SW Discharge Note   Patient Details  Name: ROTH RESS MRN: [id number] Date of Birth: May 17, 1946  [facility] of [facility] CM/SW Contact:  Candie Chroman, LCSW Phone Number: 11/30/2020, 12:32 PM   Clinical Narrative:  Patient has orders to discharge home today. No further concerns. CSW signing off.   Final next level of care: Home/Self Care Barriers to Discharge: Barriers Resolved   Patient Goals and CMS Choice        Discharge Placement                    Patient and family notified of of transfer: 11/30/20  Discharge Plan and Services                                     Social Determinants of Health (SDOH) Interventions     Readmission Risk Interventions Readmission Risk Prevention Plan 11/29/2020  Transportation Screening Complete  Medication Review ([place]) Complete  PCP or Specialist appointment within 3-5 days of discharge Complete  Palliative Care Screening Not [place] Not Applicable  Some recent data might be hidden

## 2020-11-30 NOTE — Discharge Summary (Signed)
[name] Discharge Summary   Patient: [name] [id number]:[id number]  PCP: Crecencio Mc, MD  Date of admission: 11/28/2020   Date of discharge:  11/30/2020     Discharge Diagnoses:  Principal Problem:   Atrial fibrillation with rapid ventricular response ([place]) Active Problems:   tobacco abuse   ESRD on peritoneal dialysis ([place])   HTN (hypertension)   Anemia in ESRD (end-stage renal disease) ([place])   COVID-19 virus infection   Admitted From: Home Disposition:  Home   Recommendations for Outpatient Follow-up:  PCP: in 1 wk Nephrology as per schedule and cont PD Follow up LABS/TEST:     Follow-up Information     Crecencio Mc, MD. Schedule an appointment as soon as possible for a visit in 5 day(s).   Specialty: Internal Medicine Why: Please follow up with Dr. Derrel Nip within 5 days of discharge. Contact information: [place] [place] [address] [phone]         [name], MD .   Specialty: Cardiology Contact information: [place] [address] [phone]                Diet recommendation: Regular diet  Activity: The patient is advised to gradually reintroduce usual activities, as tolerated  Discharge Condition: stable  Code Status: Full code   History of present illness: As per the H and P dictated on admission [name] is a 75 y.o. male with medical history significant for diabetes mellitus with complications of end-stage renal disease on peritoneal dialysis, atrial fibrillation not on anticoagulation due to bleeding, history of chronic systolic CHF, history of TIA, obstructive sleep apnea who presents to the ER via EMS for evaluation of weakness and dizziness for 2 days.  Patient states that he has been very weak and unable to get out of bed he also complains of myalgias, shortness of breath at rest, fever, cough productive of clear phlegm, anorexia and diarrhea. When EMS  arrived he was found to be in rapid A. fib with heart rate between 160 and 170 bpm.  He received a dose of metoprolol 5 mg IV with improvement in his heart rate to 130. He denies having any chest pain, no abdominal pain, no constipation, no diaphoresis, no headache, no blurred vision, no focal deficits. Labs show sodium 138, potassium 3.5, chloride 105, bicarb 21, glucose 126, BUN 76, creatinine 7.8, calcium 7.9, lactic acid 0.8, procalcitonin 0.91, white count 9.8, hemoglobin 9.4, hematocrit 27.7, MCV 91.7, RDW 14.8, platelet count 170 Patient SARS coronavirus 2 point-of-care test positive Chest x-ray reviewed by me shows enlargement of the cardiac silhouette.  Chronic interstitial markings without infiltrate.  Emphysema. Twelve-lead EKG reviewed by me shows atrial fibrillation with a rapid ventricular rate. ED Course: Patient is a 75 year old male who presents to the ER via EMS for evaluation of weakness.  When EMS arrived he was noted to be tachycardic and in rapid atrial fibrillation with heart rate of about 160 bpm. He received a dose of metoprolol 5 mg IV with slight improvement in his heart rate and was started on a Cardizem drip in the emergency room. Patient's SARS coronavirus 2 point-of-care test was positive.  He received 1 dose of the Moderna vaccine and denies having any sick contacts. He received a dose of remdesivir in the ER and will be admitted to the hospital for further evaluation. Hospital Course:  # Atrial fibrillation with rapid ventricular response. Patient has a history of  atrial fibrillation not on long-term anticoagulation due to history of bleeding. He presents to the ER for evaluation of weakness and is found to be in rapid A. Fib. S/p Cardizem infusion, converted back to normal sinus rhythm, rate is under control now. S/p oral Cardizem 60 mg po QID, and discontinue amlodipine due to same class of medications. Resumed bisoprolol and clonidine.  Patient was discharged on  Cardizem CD 180 mg daily, cleared by cardiologist for discharge home and follow-up as an outpatient. # COVID-19 viral infection. Patient with complaints of fever, chills, myalgias, anorexia, shortness of breath and diarrhea. His point-of-care COVID-19 test is positive. He received 1 dose of the Moderna vaccine but denies having any sick contacts. S/p remdesivir x 1 dose, patient is asymptomatic, no need to complete the treatment. Supportive care with antitussives, as needed bronchodilator therapy and vitamins. Patient is not hypoxic on room air at rest his pulse oximetry is 95%, No need for systemic steroids at this time.  # Diabetes mellitus with complications of end-stage renal disease on peritoneal dialysis. consulted nephrology for renal replacement therapy, Diabetes is diet-controlled # Nicotine dependence, Smoking cessation has been discussed with patient in detail. S/p Nicotine transdermal patch 21 mg daily # Chronic systolic heart failure, Resumed torsemide 100 mg p.o. daily as per nephrologist.  Continued home medications. # Anemia end-stage renal disease, H&H is stable Body mass index is 23.31 kg/m.    - Patient was instructed, not to drive, operate heavy machinery, perform activities at heights, swimming or participation in water activities or provide baby sitting services while on Pain, Sleep and Anxiety Medications; until his outpatient Physician has advised to do so again.  - Also recommended to not to take more than prescribed Pain, Sleep and Anxiety Medications.  On the day of the discharge the patient's vitals were stable, and no other acute medical condition were reported by patient. the patient was felt safe to be discharge at Home.  Consultants: Nephrology Procedures: Peritoneal dialysis  Discharge Exam: General: Appear in no distress, no Rash; Oral Mucosa Clear, moist. Cardiovascular: S1 and S2 Present, no Murmur, Respiratory: normal respiratory effort, Bilateral Air entry  present and no Crackles, no wheezes Abdomen: Bowel Sound present, Soft and no tenderness, no hernia Extremities: no Pedal edema, no calf tenderness Neurology: alert and oriented to time, place, and person affect appropriate.  Filed Weights   11/28/20 0739 11/29/20 0500 11/30/20 0400  Weight: 63.5 kg 65.6 kg 65.5 kg   Vitals:   11/30/20 0800 11/30/20 1142  BP: (!) 160/53 (!) 156/59  Pulse: (!) 54 61  Resp: 20 18  Temp: 98.1 F (36.7 C) 98 F (36.7 C)  SpO2: 95% 95%    DISCHARGE MEDICATION: Allergies as of 11/30/2020       Reactions   Irbesartan Other (See Comments)   hyperkalemia   Cymbalta [duloxetine Hcl]    Hydralazine Other (See Comments)   headache   Imdur [isosorbide Nitrate] Other (See Comments)   headache   Atorvastatin Other (See Comments)   Muscle pain   Bystolic [nebivolol Hcl]    Extreme fatigue         Medication List     STOP taking these medications    amLODipine 10 MG tablet Commonly known as: NORVASC       TAKE these medications    albuterol 108 (90 Base) MCG/ACT inhaler Commonly known as: VENTOLIN HFA Inhale 2 puffs into the lungs every 6 (six) hours as needed for wheezing or shortness of  breath.   Ascorbic Acid 500 MG Chew Chew 1 tablet by mouth daily.   aspirin 81 MG tablet Take 1 tablet (81 mg total) by mouth daily.   b complex vitamins capsule Take 1 capsule by mouth daily.   bisoprolol 5 MG tablet Commonly known as: ZEBETA Take 5 mg by mouth daily.   calcitRIOL 0.25 MCG capsule Commonly known as: ROCALTROL Take 0.25 mcg by mouth every Monday, Wednesday, and Friday.   cloNIDine 0.1 mg/24hr patch Commonly known as: CATAPRES - Dosed in mg/24 hr Place 0.1 mg onto the skin every Friday.   cloNIDine 0.2 MG tablet Commonly known as: CATAPRES Take 0.2 mg by mouth at bedtime.   diltiazem 180 MG 24 hr capsule Commonly known as: Cardizem CD Take 1 capsule (180 mg total) by mouth daily.   diphenhydrAMINE 25 MG  tablet Commonly known as: BENADRYL Take 25 mg by mouth every 6 (six) hours as needed for allergies.   doxazosin 8 MG tablet Commonly known as: CARDURA Take 8 mg by mouth daily.   fluticasone 50 MCG/ACT nasal spray Commonly known as: FLONASE Place 2 sprays into both nostrils daily as needed for allergies or rhinitis.   gentamicin cream 0.1 % Commonly known as: GARAMYCIN Apply 1 application topically daily.   irbesartan 300 MG tablet Commonly known as: AVAPRO Take 1 tablet (300 mg total) by mouth daily.   isosorbide mononitrate 30 MG 24 hr tablet Commonly known as: IMDUR Take 30 mg by mouth daily.   multivitamin Tabs tablet Take 1 tablet by mouth at bedtime.   rosuvastatin 20 MG tablet Commonly known as: Crestor Take 1 tablet (20 mg total) by mouth at bedtime.   sevelamer 800 MG tablet Commonly known as: RENAGEL Take 800 mg by mouth 3 (three) times daily with meals.   tiZANidine 2 MG tablet Commonly known as: ZANAFLEX Take 1 tablet (2 mg total) by mouth every 12 (twelve) hours as needed for muscle spasms.   torsemide 100 MG tablet Commonly known as: DEMADEX Take 100 mg by mouth daily. What changed: Another medication with the same name was removed. Continue taking this medication, and follow the directions you see here.       ASK your doctor about these medications    ergocalciferol 1.25 MG (50000 UT) capsule Commonly known as: Drisdol Take 1 capsule (50,000 Units total) by mouth every 14 (fourteen) days.               Discharge Care Instructions  (From admission, onward)           Start     Ordered   11/30/20 0000  Discharge wound care:       Comments: As per wound care orders   11/30/20 1214           Allergies  Allergen Reactions   Irbesartan Other (See Comments)    hyperkalemia   Cymbalta [Duloxetine Hcl]    Hydralazine Other (See Comments)    headache   Imdur [Isosorbide Nitrate] Other (See Comments)    headache   Atorvastatin  Other (See Comments)    Muscle pain   Bystolic [Nebivolol Hcl]     Extreme fatigue    Discharge Instructions     Amb referral to [facility]IB Clinic   Complete by: As directed    Call MD for:  difficulty breathing, headache or visual disturbances   Complete by: As directed    Call MD for:  extreme fatigue   Complete by: As directed  Call MD for:  persistant dizziness or light-headedness   Complete by: As directed    Call MD for:  persistant nausea and vomiting   Complete by: As directed    Call MD for:  severe uncontrolled pain   Complete by: As directed    Call MD for:  temperature >100.4   Complete by: As directed    Diet - low sodium heart healthy   Complete by: As directed    Discharge instructions   Complete by: As directed    Follow with PCP in 1 week, continue to monitor BP at home and follow with PCP to titrate medications accordingly. Follow with nephrology and continue peritoneal dialysis   Discharge wound care:   Complete by: As directed    As per wound care orders   Increase activity slowly   Complete by: As directed        The results of significant diagnostics from this hospitalization (including imaging, microbiology, ancillary and laboratory) are listed below for reference.    Significant Diagnostic Studies: CT ABDOMEN PELVIS WO CONTRAST  Result Date: 11/06/2020 CLINICAL DATA:  Melena. Abdominal pain. GI bleeding. Bleeding began yesterday. EXAM: CT ABDOMEN AND PELVIS WITHOUT CONTRAST TECHNIQUE: Multidetector CT imaging of the abdomen and pelvis was performed following the standard protocol without IV contrast. COMPARISON:  12/11/2012 FINDINGS: Lower chest: Lung bases are clear. Hepatobiliary: The liver parenchyma appears normal. There is free intraperitoneal air, including some insinuating into the peritoneal spaces of the porta hepatis. I do not think that this is in the biliary tree or portal vein. Gallbladder appears unremarkable. Pancreas: Normal Spleen: Normal  Adrenals/Urinary Tract: Adrenal glands are normal. Considerable enlargement of bilateral renal cysts, some of which are hyperdense. Mass lesion is not suspected, but not completely excluded by this noncontrast CT study. Consider MRI of the abdomen for better evaluation of the renal lesions. There is renal vascular calcification. I cannot rule out the possibility small nonobstructing stones. There is no hydronephrosis or passing stone. No stone in the bladder. Stomach/Bowel: Stomach appears normal. No evident acute small bowel pathology. The appendix appears normal. Large amount of fecal matter within the right colon. Diverticulosis of the left colon but without definable diverticulitis. No evidence of a colon mass or obstruction. Vascular/Lymphatic: Aortic atherosclerosis. Maximal diameter of the infrarenal abdominal aorta measures 2.6 cm. IVC is normal. There is iliac artery ectasia. No retroperitoneal adenopathy. Reproductive: Normal Other: Peritoneal dialysis catheter coiled in the pelvis. No evidence of kink. Intraperitoneal air is present, but a much larger volume than usually associated with peritoneal dialysis. This strongly suggestive bowel perforation. I think most likely thing would be a perforated diverticulum. Consider repeat scan with oral contrast. Musculoskeletal: No significant bone finding. IMPRESSION: Large amount of free intraperitoneal air, much more than usually seen in the setting of peritoneal dialysis. Suspicion of bowel perforation. No definable gastric or small bowel pathology, therefore perforated diverticulum is felt most likely. Multiple low-density lesions of the kidneys, along with some hyperdense lesions. Findings probably relate to renal cysts of varying density, enlarged since the comparison exam. However, mass lesion is not excluded and in this patient with a history of previous renal cell carcinoma on the left, MRI of the kidney should be considered electively to further evaluate.  Aortic atherosclerosis. These results were called by telephone at the time of interpretation on 11/06/2020 at 8:25 am to provider [facility] , who verbally acknowledged these results. Electronically Signed   By: Jan Fireman.D.  On: 11/06/2020 08:28   NM GI Blood Loss  Result Date: 11/06/2020 CLINICAL DATA:  Bright red blood in rectum 1 day prior EXAM: NUCLEAR MEDICINE GASTROINTESTINAL BLEEDING SCAN TECHNIQUE: Sequential abdominal images were obtained following intravenous administration of Tc-13m labeled red blood cells. RADIOPHARMACEUTICALS:  20.95 mCi Tc-55m pertechnetate in-vitro labeled red cells. COMPARISON:  CT abdomen and pelvis November 06, 2020 FINDINGS: There is no abnormal focus of radiotracer uptake to suggest focal site of active gastrointestinal bleeding. Note penile uptake of radiotracer which may be seen normally. IMPRESSION: No abnormal focus of radiotracer uptake to suggest focus of active gastrointestinal bleeding. Electronically Signed   By: Lowella Grip III M.D.   On: 11/06/2020 15:29   DG Chest Portable 1 View  Result Date: 11/28/2020 CLINICAL DATA:  Cough, chills, weakness, history atrial fibrillation, renal cell carcinoma, and seal carcinoma, CHF, cardiomyopathy, diabetes mellitus, end-stage renal disease, hypertension, smoker EXAM: PORTABLE CHEST 1 VIEW COMPARISON:  Portable exam 0814 hours compared to 08/07/2020 FINDINGS: Enlargement of cardiac silhouette. Mediastinal contours and pulmonary vascularity normal. Atherosclerotic calcification aorta. Emphysematous and minimal bronchitic changes consistent with COPD. Minimal chronic interstitial prominence. No acute infiltrate, pleural effusion, or pneumothorax. Bones demineralized. IMPRESSION: Enlargement of cardiac silhouette. Chronic accentuation of interstitial markings without acute infiltrate. Aortic Atherosclerosis (ICD10-I70.0) and Emphysema (ICD10-J43.9). Electronically Signed   By: Lavonia Dana M.D.   On: 11/28/2020 08:43     Microbiology: Recent Results (from the past 240 hour(s))  Blood culture (single)     Status: None (Preliminary result)   Collection Time: 11/28/20  7:47 AM   Specimen: BLOOD  Result Value Ref Range Status   Specimen Description BLOOD LEFT ANTECUBITAL  Final   Special Requests   Final    BOTTLES DRAWN AEROBIC AND ANAEROBIC Blood Culture adequate volume   Culture   Final    NO GROWTH 2 DAYS Performed at [facility], [address], [place], [place] [address]    Report Status PENDING  Incomplete  Resp Panel by RT-PCR (Flu A&B, Covid) Nasopharyngeal Swab     Status: Abnormal   Collection Time: 11/28/20  8:01 AM   Specimen: Nasopharyngeal Swab; Nasopharyngeal(NP) swabs in vial transport medium  Result Value Ref Range Status   SARS Coronavirus 2 by RT PCR POSITIVE (A) NEGATIVE Final    Comment: RESULT CALLED TO, READ BACK BY AND VERIFIED WITH: Charlotte Crumb, RN @0924  ON 07.08.22 (NOTE) SARS-CoV-2 target nucleic acids are DETECTED.  The SARS-CoV-2 RNA is generally detectable in upper respiratory specimens during the acute phase of infection. Positive results are indicative of the presence of the identified virus, but do not rule out bacterial infection or co-infection with other pathogens not detected by the test. Clinical correlation with patient history and other diagnostic information is necessary to determine patient infection status. The expected result is Negative.  Fact Sheet for Patients: EntrepreneurPulse.com.au  Fact Sheet for Healthcare Providers: IncredibleEmployment.be  This test is not yet approved or cleared by the [place] FDA and  has been authorized for detection and/or diagnosis of SARS-CoV-2 by FDA under an Emergency Use Authorization (EUA).  This EUA will remain in effect (meaning this test can  be used) for the duration of  the COVID-19 declaration under Section 564(b)(1) of the Act, 21 U.S.C. section  360bbb-3(b)(1), unless the authorization is terminated or revoked sooner.     Influenza A by PCR NEGATIVE NEGATIVE Final   Influenza B by PCR NEGATIVE NEGATIVE Final    Comment: (NOTE) The Xpert Xpress  SARS-CoV-2/FLU/RSV plus assay is intended as an aid in the diagnosis of influenza from Nasopharyngeal swab specimens and should not be used as a sole basis for treatment. Nasal washings and aspirates are unacceptable for Xpert Xpress SARS-CoV-2/FLU/RSV testing.  Fact Sheet for Patients: EntrepreneurPulse.com.au  Fact Sheet for Healthcare Providers: IncredibleEmployment.be  This test is not yet approved or cleared by the [place] FDA and has been authorized for detection and/or diagnosis of SARS-CoV-2 by FDA under an Emergency Use Authorization (EUA). This EUA will remain in effect (meaning this test can be used) for the duration of the COVID-19 declaration under Section 564(b)(1) of the Act, 21 U.S.C. section 360bbb-3(b)(1), unless the authorization is terminated or revoked.  Performed at [facility], [address] [place], [place] [address]      Labs: CBC: Recent Labs  Lab 11/28/20 0745 11/29/20 0634 11/30/20 0529  WBC 9.8 7.3 5.7  NEUTROABS  --  4.8 3.4  HGB 9.4* 8.1* 7.9*  HCT 27.7* 24.0* 23.2*  MCV 91.7 92.3 91.0  PLT 170 124* 615*   Basic Metabolic Panel: Recent Labs  Lab 11/28/20 0745 11/29/20 0634 11/30/20 0529  NA 138 139 140  K 3.5 3.3* 3.7  CL 105 108 109  CO2 21* 23 24  GLUCOSE 126* 89 97  BUN 76* 66* 61*  CREATININE 7.88* 7.41* 7.31*  CALCIUM 7.9* 8.0* 8.1*  MG  --  2.1 2.2  PHOS  --  5.8* 6.1*   Liver Function Tests: Recent Labs  Lab 11/29/20 0634 11/30/20 0529  AST 22 16  ALT 18 15  ALKPHOS 83 77  BILITOT 0.7 0.6  PROT 5.4* 5.1*  ALBUMIN 2.5* 2.4*   No results for input(s): LIPASE, AMYLASE in the last 168 hours. No results for input(s): AMMONIA in the last 168 hours. Cardiac  Enzymes: No results for input(s): CKTOTAL, CKMB, CKMBINDEX, TROPONINI in the last 168 hours. BNP (last 3 results) Recent Labs    05/11/20 1042 08/06/20 1804  BNP 3,057.7* 1,785.3*   CBG: Recent Labs  Lab 11/29/20 1130 11/29/20 1620 11/29/20 2039 11/30/20 0748 11/30/20 1142  GLUCAP 138* 105* 169* 95 166*    Time spent: 35 minutes  Signed:  Val Riles  [name]  11/30/2020 12:15 PM

## 2020-12-01 ENCOUNTER — Telehealth: Payer: Medicare Other

## 2020-12-01 DIAGNOSIS — N186 End stage renal disease: Secondary | ICD-10-CM | POA: Diagnosis not present

## 2020-12-01 DIAGNOSIS — Z992 Dependence on renal dialysis: Secondary | ICD-10-CM | POA: Diagnosis not present

## 2020-12-01 DIAGNOSIS — D509 Iron deficiency anemia, unspecified: Secondary | ICD-10-CM | POA: Diagnosis not present

## 2020-12-01 DIAGNOSIS — N2581 Secondary hyperparathyroidism of renal origin: Secondary | ICD-10-CM | POA: Diagnosis not present

## 2020-12-01 DIAGNOSIS — D631 Anemia in chronic kidney disease: Secondary | ICD-10-CM | POA: Diagnosis not present

## 2020-12-02 ENCOUNTER — Telehealth: Payer: Self-pay

## 2020-12-02 DIAGNOSIS — D509 Iron deficiency anemia, unspecified: Secondary | ICD-10-CM | POA: Diagnosis not present

## 2020-12-02 DIAGNOSIS — N186 End stage renal disease: Secondary | ICD-10-CM | POA: Diagnosis not present

## 2020-12-02 DIAGNOSIS — Z992 Dependence on renal dialysis: Secondary | ICD-10-CM | POA: Diagnosis not present

## 2020-12-02 DIAGNOSIS — D631 Anemia in chronic kidney disease: Secondary | ICD-10-CM | POA: Diagnosis not present

## 2020-12-02 DIAGNOSIS — N2581 Secondary hyperparathyroidism of renal origin: Secondary | ICD-10-CM | POA: Diagnosis not present

## 2020-12-02 NOTE — Telephone Encounter (Signed)
Transition Care Management Unsuccessful Follow-up Telephone Call  Date of discharge and from where:  11/30/20 from [facility]  Attempts:  1st Attempt  Reason for unsuccessful TCM follow-up call:  Unable to reach patient. Will follow.

## 2020-12-03 DIAGNOSIS — N186 End stage renal disease: Secondary | ICD-10-CM | POA: Diagnosis not present

## 2020-12-03 DIAGNOSIS — D509 Iron deficiency anemia, unspecified: Secondary | ICD-10-CM | POA: Diagnosis not present

## 2020-12-03 DIAGNOSIS — Z992 Dependence on renal dialysis: Secondary | ICD-10-CM | POA: Diagnosis not present

## 2020-12-03 DIAGNOSIS — D631 Anemia in chronic kidney disease: Secondary | ICD-10-CM | POA: Diagnosis not present

## 2020-12-03 DIAGNOSIS — N2581 Secondary hyperparathyroidism of renal origin: Secondary | ICD-10-CM | POA: Diagnosis not present

## 2020-12-03 LAB — CULTURE, BLOOD (SINGLE)
Culture: NO GROWTH
Special Requests: ADEQUATE

## 2020-12-03 NOTE — Telephone Encounter (Signed)
Transition Care Management Unsuccessful Follow-up Telephone Call  Date of discharge and from where:  11/30/20 from [facility]  Attempts:  2nd Attempt  Reason for unsuccessful TCM follow-up call:  Left voice message

## 2020-12-04 DIAGNOSIS — N2581 Secondary hyperparathyroidism of renal origin: Secondary | ICD-10-CM | POA: Diagnosis not present

## 2020-12-04 DIAGNOSIS — N186 End stage renal disease: Secondary | ICD-10-CM | POA: Diagnosis not present

## 2020-12-04 DIAGNOSIS — D631 Anemia in chronic kidney disease: Secondary | ICD-10-CM | POA: Diagnosis not present

## 2020-12-04 DIAGNOSIS — D509 Iron deficiency anemia, unspecified: Secondary | ICD-10-CM | POA: Diagnosis not present

## 2020-12-04 DIAGNOSIS — Z992 Dependence on renal dialysis: Secondary | ICD-10-CM | POA: Diagnosis not present

## 2020-12-04 NOTE — Telephone Encounter (Signed)
Transition Care Management Unsuccessful Follow-up Telephone Call  Date of discharge and from where:  11/30/20 from [facility]  Attempts:  3rd Attempt  Reason for unsuccessful TCM follow-up call:  Unable to reach patient. Will follow.

## 2020-12-05 ENCOUNTER — Telehealth: Payer: Medicare Other

## 2020-12-05 ENCOUNTER — Telehealth: Payer: Self-pay | Admitting: *Deleted

## 2020-12-05 DIAGNOSIS — Z992 Dependence on renal dialysis: Secondary | ICD-10-CM | POA: Diagnosis not present

## 2020-12-05 DIAGNOSIS — N186 End stage renal disease: Secondary | ICD-10-CM | POA: Diagnosis not present

## 2020-12-05 DIAGNOSIS — D509 Iron deficiency anemia, unspecified: Secondary | ICD-10-CM | POA: Diagnosis not present

## 2020-12-05 DIAGNOSIS — N2581 Secondary hyperparathyroidism of renal origin: Secondary | ICD-10-CM | POA: Diagnosis not present

## 2020-12-05 DIAGNOSIS — D631 Anemia in chronic kidney disease: Secondary | ICD-10-CM | POA: Diagnosis not present

## 2020-12-05 NOTE — Telephone Encounter (Signed)
  Care Management   Follow Up Note   12/05/2020 Name: [name] MRN: [id number] DOB: 02-Feb-1946   Referred by: Crecencio Mc, MD Reason for referral : Chronic Care Management (HTN, CHF)   Original outreach scheduled for 7/11 cancelled by patient/wife due to patient being in hospital.  An unsuccessful telephone outreach was attempted today. The patient was referred to the case management team for assistance with care management and care coordination.   Follow Up Plan:  RNCM will seek assistance from Care Guides in rescheduling this outreach within the next 20 business days.  Hubert Azure RN, MSN RN Care Management Coordinator [place] [phone] [email or web address]

## 2020-12-05 NOTE — Telephone Encounter (Signed)
Transition Care Management Unsuccessful Follow-up Telephone Call  Date of discharge and from where:  11/30/20 from [facility]  Attempts:  4th attempt.   Reason for unsuccessful TCM follow-up call:  Left voice message. No hfu scheduled. Transition of Care attempt closed.

## 2020-12-06 DIAGNOSIS — N2581 Secondary hyperparathyroidism of renal origin: Secondary | ICD-10-CM | POA: Diagnosis not present

## 2020-12-06 DIAGNOSIS — N186 End stage renal disease: Secondary | ICD-10-CM | POA: Diagnosis not present

## 2020-12-06 DIAGNOSIS — Z992 Dependence on renal dialysis: Secondary | ICD-10-CM | POA: Diagnosis not present

## 2020-12-06 DIAGNOSIS — D509 Iron deficiency anemia, unspecified: Secondary | ICD-10-CM | POA: Diagnosis not present

## 2020-12-06 DIAGNOSIS — D631 Anemia in chronic kidney disease: Secondary | ICD-10-CM | POA: Diagnosis not present

## 2020-12-07 DIAGNOSIS — D631 Anemia in chronic kidney disease: Secondary | ICD-10-CM | POA: Diagnosis not present

## 2020-12-07 DIAGNOSIS — Z992 Dependence on renal dialysis: Secondary | ICD-10-CM | POA: Diagnosis not present

## 2020-12-07 DIAGNOSIS — N2581 Secondary hyperparathyroidism of renal origin: Secondary | ICD-10-CM | POA: Diagnosis not present

## 2020-12-07 DIAGNOSIS — D509 Iron deficiency anemia, unspecified: Secondary | ICD-10-CM | POA: Diagnosis not present

## 2020-12-07 DIAGNOSIS — N186 End stage renal disease: Secondary | ICD-10-CM | POA: Diagnosis not present

## 2020-12-08 DIAGNOSIS — D631 Anemia in chronic kidney disease: Secondary | ICD-10-CM | POA: Diagnosis not present

## 2020-12-08 DIAGNOSIS — N2581 Secondary hyperparathyroidism of renal origin: Secondary | ICD-10-CM | POA: Diagnosis not present

## 2020-12-08 DIAGNOSIS — Z992 Dependence on renal dialysis: Secondary | ICD-10-CM | POA: Diagnosis not present

## 2020-12-08 DIAGNOSIS — N186 End stage renal disease: Secondary | ICD-10-CM | POA: Diagnosis not present

## 2020-12-08 DIAGNOSIS — D509 Iron deficiency anemia, unspecified: Secondary | ICD-10-CM | POA: Diagnosis not present

## 2020-12-09 ENCOUNTER — Telehealth: Payer: Self-pay

## 2020-12-09 DIAGNOSIS — D509 Iron deficiency anemia, unspecified: Secondary | ICD-10-CM | POA: Diagnosis not present

## 2020-12-09 DIAGNOSIS — N186 End stage renal disease: Secondary | ICD-10-CM | POA: Diagnosis not present

## 2020-12-09 DIAGNOSIS — N2581 Secondary hyperparathyroidism of renal origin: Secondary | ICD-10-CM | POA: Diagnosis not present

## 2020-12-09 DIAGNOSIS — D631 Anemia in chronic kidney disease: Secondary | ICD-10-CM | POA: Diagnosis not present

## 2020-12-09 DIAGNOSIS — Z992 Dependence on renal dialysis: Secondary | ICD-10-CM | POA: Diagnosis not present

## 2020-12-09 NOTE — Chronic Care Management (AMB) (Signed)
  Care Management   Note  12/09/2020 Name: [name] MRN: [id number] DOB: March 01, 1946  [name] is a 75 y.o. year old male who is a primary care patient of Derrel Nip, Aris Everts, MD and is actively engaged with the care management team. I reached out to [name] by phone today to assist with re-scheduling a follow up visit with the RN Case Manager  Follow up plan: Unsuccessful telephone outreach attempt made. A HIPAA compliant phone message was left for the patient providing contact information and requesting a return call.  The care management team will reach out to the patient again over the next 7 days.  If patient returns call to provider office, please advise to call [facility]  at [place], [place], [place], [place] [address] Direct Dial: [phone] [email or web address]: [email or web address]

## 2020-12-10 DIAGNOSIS — D631 Anemia in chronic kidney disease: Secondary | ICD-10-CM | POA: Diagnosis not present

## 2020-12-10 DIAGNOSIS — Z992 Dependence on renal dialysis: Secondary | ICD-10-CM | POA: Diagnosis not present

## 2020-12-10 DIAGNOSIS — D509 Iron deficiency anemia, unspecified: Secondary | ICD-10-CM | POA: Diagnosis not present

## 2020-12-10 DIAGNOSIS — N2581 Secondary hyperparathyroidism of renal origin: Secondary | ICD-10-CM | POA: Diagnosis not present

## 2020-12-10 DIAGNOSIS — N186 End stage renal disease: Secondary | ICD-10-CM | POA: Diagnosis not present

## 2020-12-11 DIAGNOSIS — Z992 Dependence on renal dialysis: Secondary | ICD-10-CM | POA: Diagnosis not present

## 2020-12-11 DIAGNOSIS — E119 Type 2 diabetes mellitus without complications: Secondary | ICD-10-CM | POA: Diagnosis not present

## 2020-12-11 DIAGNOSIS — D631 Anemia in chronic kidney disease: Secondary | ICD-10-CM | POA: Diagnosis not present

## 2020-12-11 DIAGNOSIS — N186 End stage renal disease: Secondary | ICD-10-CM | POA: Diagnosis not present

## 2020-12-11 DIAGNOSIS — D509 Iron deficiency anemia, unspecified: Secondary | ICD-10-CM | POA: Diagnosis not present

## 2020-12-11 DIAGNOSIS — N2581 Secondary hyperparathyroidism of renal origin: Secondary | ICD-10-CM | POA: Diagnosis not present

## 2020-12-12 ENCOUNTER — Ambulatory Visit: Payer: Medicare Other | Admitting: Internal Medicine

## 2020-12-12 DIAGNOSIS — N186 End stage renal disease: Secondary | ICD-10-CM | POA: Diagnosis not present

## 2020-12-12 DIAGNOSIS — D509 Iron deficiency anemia, unspecified: Secondary | ICD-10-CM | POA: Diagnosis not present

## 2020-12-12 DIAGNOSIS — D631 Anemia in chronic kidney disease: Secondary | ICD-10-CM | POA: Diagnosis not present

## 2020-12-12 DIAGNOSIS — Z992 Dependence on renal dialysis: Secondary | ICD-10-CM | POA: Diagnosis not present

## 2020-12-12 DIAGNOSIS — N2581 Secondary hyperparathyroidism of renal origin: Secondary | ICD-10-CM | POA: Diagnosis not present

## 2020-12-13 DIAGNOSIS — N186 End stage renal disease: Secondary | ICD-10-CM | POA: Diagnosis not present

## 2020-12-13 DIAGNOSIS — D631 Anemia in chronic kidney disease: Secondary | ICD-10-CM | POA: Diagnosis not present

## 2020-12-13 DIAGNOSIS — N2581 Secondary hyperparathyroidism of renal origin: Secondary | ICD-10-CM | POA: Diagnosis not present

## 2020-12-13 DIAGNOSIS — D509 Iron deficiency anemia, unspecified: Secondary | ICD-10-CM | POA: Diagnosis not present

## 2020-12-13 DIAGNOSIS — Z992 Dependence on renal dialysis: Secondary | ICD-10-CM | POA: Diagnosis not present

## 2020-12-14 DIAGNOSIS — D509 Iron deficiency anemia, unspecified: Secondary | ICD-10-CM | POA: Diagnosis not present

## 2020-12-14 DIAGNOSIS — Z992 Dependence on renal dialysis: Secondary | ICD-10-CM | POA: Diagnosis not present

## 2020-12-14 DIAGNOSIS — D631 Anemia in chronic kidney disease: Secondary | ICD-10-CM | POA: Diagnosis not present

## 2020-12-14 DIAGNOSIS — N186 End stage renal disease: Secondary | ICD-10-CM | POA: Diagnosis not present

## 2020-12-14 DIAGNOSIS — N2581 Secondary hyperparathyroidism of renal origin: Secondary | ICD-10-CM | POA: Diagnosis not present

## 2020-12-15 DIAGNOSIS — Z992 Dependence on renal dialysis: Secondary | ICD-10-CM | POA: Diagnosis not present

## 2020-12-15 DIAGNOSIS — N186 End stage renal disease: Secondary | ICD-10-CM | POA: Diagnosis not present

## 2020-12-15 DIAGNOSIS — D509 Iron deficiency anemia, unspecified: Secondary | ICD-10-CM | POA: Diagnosis not present

## 2020-12-15 DIAGNOSIS — N2581 Secondary hyperparathyroidism of renal origin: Secondary | ICD-10-CM | POA: Diagnosis not present

## 2020-12-15 DIAGNOSIS — D631 Anemia in chronic kidney disease: Secondary | ICD-10-CM | POA: Diagnosis not present

## 2020-12-16 DIAGNOSIS — D631 Anemia in chronic kidney disease: Secondary | ICD-10-CM | POA: Diagnosis not present

## 2020-12-16 DIAGNOSIS — N186 End stage renal disease: Secondary | ICD-10-CM | POA: Diagnosis not present

## 2020-12-16 DIAGNOSIS — N2581 Secondary hyperparathyroidism of renal origin: Secondary | ICD-10-CM | POA: Diagnosis not present

## 2020-12-16 DIAGNOSIS — Z992 Dependence on renal dialysis: Secondary | ICD-10-CM | POA: Diagnosis not present

## 2020-12-16 DIAGNOSIS — D509 Iron deficiency anemia, unspecified: Secondary | ICD-10-CM | POA: Diagnosis not present

## 2020-12-16 NOTE — Chronic Care Management (AMB) (Signed)
  Care Management   Note  12/16/2020 Name: [name] MRN: [id number] DOB: 1946-05-08  [name] is a 75 y.o. year old male who is a primary care patient of Derrel Nip, Aris Everts, MD and is actively engaged with the care management team. I reached out to [name] by phone today to assist with re-scheduling a follow up visit with the RN Case Manager  Follow up plan: Unsuccessful telephone outreach attempt made. A HIPAA compliant phone message was left for the patient providing contact information and requesting a return call.  The care management team will reach out to the patient again over the next 7 days.  If patient returns call to provider office, please advise to call [facility]  at [place], [place], [place], [place] [address] Direct Dial: [phone] [name]: [email or web address]

## 2020-12-17 ENCOUNTER — Other Ambulatory Visit: Payer: Self-pay | Admitting: Internal Medicine

## 2020-12-17 DIAGNOSIS — D509 Iron deficiency anemia, unspecified: Secondary | ICD-10-CM | POA: Diagnosis not present

## 2020-12-17 DIAGNOSIS — N2581 Secondary hyperparathyroidism of renal origin: Secondary | ICD-10-CM | POA: Diagnosis not present

## 2020-12-17 DIAGNOSIS — N186 End stage renal disease: Secondary | ICD-10-CM

## 2020-12-17 DIAGNOSIS — N185 Chronic kidney disease, stage 5: Secondary | ICD-10-CM

## 2020-12-17 DIAGNOSIS — I120 Hypertensive chronic kidney disease with stage 5 chronic kidney disease or end stage renal disease: Secondary | ICD-10-CM

## 2020-12-17 DIAGNOSIS — Z992 Dependence on renal dialysis: Secondary | ICD-10-CM | POA: Diagnosis not present

## 2020-12-17 DIAGNOSIS — D631 Anemia in chronic kidney disease: Secondary | ICD-10-CM | POA: Diagnosis not present

## 2020-12-18 DIAGNOSIS — N2581 Secondary hyperparathyroidism of renal origin: Secondary | ICD-10-CM | POA: Diagnosis not present

## 2020-12-18 DIAGNOSIS — Z992 Dependence on renal dialysis: Secondary | ICD-10-CM | POA: Diagnosis not present

## 2020-12-18 DIAGNOSIS — D509 Iron deficiency anemia, unspecified: Secondary | ICD-10-CM | POA: Diagnosis not present

## 2020-12-18 DIAGNOSIS — N186 End stage renal disease: Secondary | ICD-10-CM | POA: Diagnosis not present

## 2020-12-18 DIAGNOSIS — D631 Anemia in chronic kidney disease: Secondary | ICD-10-CM | POA: Diagnosis not present

## 2020-12-19 DIAGNOSIS — N2581 Secondary hyperparathyroidism of renal origin: Secondary | ICD-10-CM | POA: Diagnosis not present

## 2020-12-19 DIAGNOSIS — N186 End stage renal disease: Secondary | ICD-10-CM | POA: Diagnosis not present

## 2020-12-19 DIAGNOSIS — D509 Iron deficiency anemia, unspecified: Secondary | ICD-10-CM | POA: Diagnosis not present

## 2020-12-19 DIAGNOSIS — D631 Anemia in chronic kidney disease: Secondary | ICD-10-CM | POA: Diagnosis not present

## 2020-12-19 DIAGNOSIS — Z992 Dependence on renal dialysis: Secondary | ICD-10-CM | POA: Diagnosis not present

## 2020-12-20 DIAGNOSIS — Z992 Dependence on renal dialysis: Secondary | ICD-10-CM | POA: Diagnosis not present

## 2020-12-20 DIAGNOSIS — D509 Iron deficiency anemia, unspecified: Secondary | ICD-10-CM | POA: Diagnosis not present

## 2020-12-20 DIAGNOSIS — D631 Anemia in chronic kidney disease: Secondary | ICD-10-CM | POA: Diagnosis not present

## 2020-12-20 DIAGNOSIS — N186 End stage renal disease: Secondary | ICD-10-CM | POA: Diagnosis not present

## 2020-12-20 DIAGNOSIS — N2581 Secondary hyperparathyroidism of renal origin: Secondary | ICD-10-CM | POA: Diagnosis not present

## 2020-12-21 DIAGNOSIS — N186 End stage renal disease: Secondary | ICD-10-CM | POA: Diagnosis not present

## 2020-12-21 DIAGNOSIS — D631 Anemia in chronic kidney disease: Secondary | ICD-10-CM | POA: Diagnosis not present

## 2020-12-21 DIAGNOSIS — Z992 Dependence on renal dialysis: Secondary | ICD-10-CM | POA: Diagnosis not present

## 2020-12-21 DIAGNOSIS — N2581 Secondary hyperparathyroidism of renal origin: Secondary | ICD-10-CM | POA: Diagnosis not present

## 2020-12-21 DIAGNOSIS — D509 Iron deficiency anemia, unspecified: Secondary | ICD-10-CM | POA: Diagnosis not present

## 2020-12-22 DIAGNOSIS — N186 End stage renal disease: Secondary | ICD-10-CM | POA: Diagnosis not present

## 2020-12-22 DIAGNOSIS — D509 Iron deficiency anemia, unspecified: Secondary | ICD-10-CM | POA: Diagnosis not present

## 2020-12-22 DIAGNOSIS — N2581 Secondary hyperparathyroidism of renal origin: Secondary | ICD-10-CM | POA: Diagnosis not present

## 2020-12-22 DIAGNOSIS — D631 Anemia in chronic kidney disease: Secondary | ICD-10-CM | POA: Diagnosis not present

## 2020-12-22 DIAGNOSIS — Z992 Dependence on renal dialysis: Secondary | ICD-10-CM | POA: Diagnosis not present

## 2020-12-23 DIAGNOSIS — Z992 Dependence on renal dialysis: Secondary | ICD-10-CM | POA: Diagnosis not present

## 2020-12-23 DIAGNOSIS — N2581 Secondary hyperparathyroidism of renal origin: Secondary | ICD-10-CM | POA: Diagnosis not present

## 2020-12-23 DIAGNOSIS — D509 Iron deficiency anemia, unspecified: Secondary | ICD-10-CM | POA: Diagnosis not present

## 2020-12-23 DIAGNOSIS — N186 End stage renal disease: Secondary | ICD-10-CM | POA: Diagnosis not present

## 2020-12-23 DIAGNOSIS — D631 Anemia in chronic kidney disease: Secondary | ICD-10-CM | POA: Diagnosis not present

## 2020-12-24 DIAGNOSIS — N186 End stage renal disease: Secondary | ICD-10-CM | POA: Diagnosis not present

## 2020-12-24 DIAGNOSIS — D631 Anemia in chronic kidney disease: Secondary | ICD-10-CM | POA: Diagnosis not present

## 2020-12-24 DIAGNOSIS — Z992 Dependence on renal dialysis: Secondary | ICD-10-CM | POA: Diagnosis not present

## 2020-12-24 DIAGNOSIS — D509 Iron deficiency anemia, unspecified: Secondary | ICD-10-CM | POA: Diagnosis not present

## 2020-12-24 DIAGNOSIS — N2581 Secondary hyperparathyroidism of renal origin: Secondary | ICD-10-CM | POA: Diagnosis not present

## 2020-12-24 NOTE — Chronic Care Management (AMB) (Signed)
  Care Management   Note  12/24/2020 Name: KAMARIAN SAHAKIAN MRN: [id number] DOB: 21-Oct-1945  [name] is a 75 y.o. year old male who is a primary care patient of Derrel Nip, Aris Everts, MD and is actively engaged with the care management team. I reached out to [name] by phone today to assist with re-scheduling a follow up visit with the RN Case Manager and Pharmacist  Follow up plan: Unable to make contact on outreach attempts x 3. PCP Crecencio Mc, MD notified via routed documentation in medical record.   Noreene Larsson, [place], [place], [place] [address] Direct Dial: [phone] [email or web address]: [email or web address]

## 2020-12-24 NOTE — Telephone Encounter (Signed)
3rd unsuccessful outreach  

## 2020-12-25 DIAGNOSIS — D509 Iron deficiency anemia, unspecified: Secondary | ICD-10-CM | POA: Diagnosis not present

## 2020-12-25 DIAGNOSIS — N186 End stage renal disease: Secondary | ICD-10-CM | POA: Diagnosis not present

## 2020-12-25 DIAGNOSIS — D631 Anemia in chronic kidney disease: Secondary | ICD-10-CM | POA: Diagnosis not present

## 2020-12-25 DIAGNOSIS — Z992 Dependence on renal dialysis: Secondary | ICD-10-CM | POA: Diagnosis not present

## 2020-12-25 DIAGNOSIS — N2581 Secondary hyperparathyroidism of renal origin: Secondary | ICD-10-CM | POA: Diagnosis not present

## 2020-12-26 ENCOUNTER — Other Ambulatory Visit: Payer: Self-pay

## 2020-12-26 ENCOUNTER — Ambulatory Visit (INDEPENDENT_AMBULATORY_CARE_PROVIDER_SITE_OTHER): Payer: Medicare Other

## 2020-12-26 DIAGNOSIS — N185 Chronic kidney disease, stage 5: Secondary | ICD-10-CM | POA: Diagnosis not present

## 2020-12-26 DIAGNOSIS — I120 Hypertensive chronic kidney disease with stage 5 chronic kidney disease or end stage renal disease: Secondary | ICD-10-CM

## 2020-12-26 DIAGNOSIS — D509 Iron deficiency anemia, unspecified: Secondary | ICD-10-CM | POA: Diagnosis not present

## 2020-12-26 DIAGNOSIS — D631 Anemia in chronic kidney disease: Secondary | ICD-10-CM | POA: Diagnosis not present

## 2020-12-26 DIAGNOSIS — N186 End stage renal disease: Secondary | ICD-10-CM | POA: Diagnosis not present

## 2020-12-26 DIAGNOSIS — N2581 Secondary hyperparathyroidism of renal origin: Secondary | ICD-10-CM | POA: Diagnosis not present

## 2020-12-26 DIAGNOSIS — Z992 Dependence on renal dialysis: Secondary | ICD-10-CM | POA: Diagnosis not present

## 2020-12-27 DIAGNOSIS — Z992 Dependence on renal dialysis: Secondary | ICD-10-CM | POA: Diagnosis not present

## 2020-12-27 DIAGNOSIS — D509 Iron deficiency anemia, unspecified: Secondary | ICD-10-CM | POA: Diagnosis not present

## 2020-12-27 DIAGNOSIS — N186 End stage renal disease: Secondary | ICD-10-CM | POA: Diagnosis not present

## 2020-12-27 DIAGNOSIS — N2581 Secondary hyperparathyroidism of renal origin: Secondary | ICD-10-CM | POA: Diagnosis not present

## 2020-12-27 DIAGNOSIS — D631 Anemia in chronic kidney disease: Secondary | ICD-10-CM | POA: Diagnosis not present

## 2020-12-28 DIAGNOSIS — D631 Anemia in chronic kidney disease: Secondary | ICD-10-CM | POA: Diagnosis not present

## 2020-12-28 DIAGNOSIS — Z992 Dependence on renal dialysis: Secondary | ICD-10-CM | POA: Diagnosis not present

## 2020-12-28 DIAGNOSIS — N186 End stage renal disease: Secondary | ICD-10-CM | POA: Diagnosis not present

## 2020-12-28 DIAGNOSIS — D509 Iron deficiency anemia, unspecified: Secondary | ICD-10-CM | POA: Diagnosis not present

## 2020-12-28 DIAGNOSIS — N2581 Secondary hyperparathyroidism of renal origin: Secondary | ICD-10-CM | POA: Diagnosis not present

## 2020-12-29 ENCOUNTER — Other Ambulatory Visit: Payer: Self-pay | Admitting: Nurse Practitioner

## 2020-12-29 DIAGNOSIS — N186 End stage renal disease: Secondary | ICD-10-CM | POA: Diagnosis not present

## 2020-12-29 DIAGNOSIS — Z992 Dependence on renal dialysis: Secondary | ICD-10-CM | POA: Diagnosis not present

## 2020-12-29 DIAGNOSIS — D631 Anemia in chronic kidney disease: Secondary | ICD-10-CM | POA: Diagnosis not present

## 2020-12-29 DIAGNOSIS — D509 Iron deficiency anemia, unspecified: Secondary | ICD-10-CM | POA: Diagnosis not present

## 2020-12-29 DIAGNOSIS — N2581 Secondary hyperparathyroidism of renal origin: Secondary | ICD-10-CM | POA: Diagnosis not present

## 2020-12-30 DIAGNOSIS — N186 End stage renal disease: Secondary | ICD-10-CM | POA: Diagnosis not present

## 2020-12-30 DIAGNOSIS — Z992 Dependence on renal dialysis: Secondary | ICD-10-CM | POA: Diagnosis not present

## 2020-12-30 DIAGNOSIS — N2581 Secondary hyperparathyroidism of renal origin: Secondary | ICD-10-CM | POA: Diagnosis not present

## 2020-12-30 DIAGNOSIS — D631 Anemia in chronic kidney disease: Secondary | ICD-10-CM | POA: Diagnosis not present

## 2020-12-30 DIAGNOSIS — D509 Iron deficiency anemia, unspecified: Secondary | ICD-10-CM | POA: Diagnosis not present

## 2020-12-31 DIAGNOSIS — N2581 Secondary hyperparathyroidism of renal origin: Secondary | ICD-10-CM | POA: Diagnosis not present

## 2020-12-31 DIAGNOSIS — D509 Iron deficiency anemia, unspecified: Secondary | ICD-10-CM | POA: Diagnosis not present

## 2020-12-31 DIAGNOSIS — N186 End stage renal disease: Secondary | ICD-10-CM | POA: Diagnosis not present

## 2020-12-31 DIAGNOSIS — Z992 Dependence on renal dialysis: Secondary | ICD-10-CM | POA: Diagnosis not present

## 2020-12-31 DIAGNOSIS — D631 Anemia in chronic kidney disease: Secondary | ICD-10-CM | POA: Diagnosis not present

## 2021-01-01 DIAGNOSIS — N2581 Secondary hyperparathyroidism of renal origin: Secondary | ICD-10-CM | POA: Diagnosis not present

## 2021-01-01 DIAGNOSIS — Z992 Dependence on renal dialysis: Secondary | ICD-10-CM | POA: Diagnosis not present

## 2021-01-01 DIAGNOSIS — D631 Anemia in chronic kidney disease: Secondary | ICD-10-CM | POA: Diagnosis not present

## 2021-01-01 DIAGNOSIS — D509 Iron deficiency anemia, unspecified: Secondary | ICD-10-CM | POA: Diagnosis not present

## 2021-01-01 DIAGNOSIS — N186 End stage renal disease: Secondary | ICD-10-CM | POA: Diagnosis not present

## 2021-01-01 NOTE — Progress Notes (Signed)
Cardiology Office Note:    Date:  01/02/2021   ID:  [name], DOB 05-14-1946, MRN [id number]  PCP:  Crecencio Mc, MD  [facility] Cardiologist:  [name], MD  [facility] Electrophysiologist:  None   Referring MD: Crecencio Mc, MD   Chief Complaint: 6 week follow-up/ED f/u  History of Present Illness:    [name] is a 75 y.o. male with a hx of ESRD on PD, DM2, CM with EF 40-45%, prior abnormal stress test, HTN, HLD, PAD, sleep apnea, renal cell carcinoma s/p left heminephrectomy, tobacco abuse, stroke, and adenocarcinoma.   Echo in 12/2018 which showed an EF of 40 to 45%, mild to moderately increased LV wall thickness, diffuse LV hypokinesis, normal RV systolic function and ventricular cavity size, mildly to moderately dilated left atrium, mild aortic insufficiency, mild to moderate mitral regurgitation, dilated aortic root measuring 3.7 cm, and dilated ascending aorta measuring 3.7 cm.   He was admitted to the hospital 12/2019 with a short dissection of the proximal left subclavian artery, which was determined to be nonsurgical by vascular surgery, when he presented with lack of coordination and multiple small infarcts involving the left cerebral hemisphere.  Echo at that time showed an EF of 40 to 45%, global hypokinesis, grade 1 diastolic dysfunction, normal RV systolic function and ventricular cavity size, severely dilated left atrium, and mild mitral regurgitation.  Subsequent event monitoring did not show evidence of A. fib.  In 04/2020, he was admitted for acute on chronic HFrEF with hypertensive urgency.  There was question of PAF when EMS first arrived on scene though this was unable to be confirmed by tracings.  In this setting the decision was made to defer anticoagulation.  Echo during that admission showed a stable cardiomyopathy with an EF of 40 to 45%, no regional wall motion abnormalities, mildly dilated LV cavity size, moderate LVH, grade 1 diastolic  dysfunction, normal RV systolic function and ventricular cavity size, moderately dilated left atrium, mild mitral regurgitation, mild aortic insufficiency, mild to moderate aortic valve sclerosis without evidence of stenosis, dilated aortic root measuring 39 mm. HE has been followed in the office and with ongoing dyspnea R/L hear cath had been dicussed.    Seen 10/29/20 and reported fatigue and dyspnea. Coreg was switched to bisoprolol.   He was hospitalized [facility] 11/06/20 due to rectal bleeding felt to be from peritoneal dialysis catheter. He was managed conservatively. Plavix was held ( was on for stroke).   Last seen 11/21/20 and was almost back to baseline. Cath discussed given ongoing dyspnea  and CM, but deferred due to recent GIB. Renal artery dopler for HTN. Renal US showed no significant narrowing of the arteries.  Admission 7/10 for new onset afib and COVID infection. He was discharged on cardizem 60mg  QID and not on anticoagulation due to GIB. They also noted a h/o afib, however it appears this would be a new dx.   Today, EKGs reviewed. Appears he had Afib RVR by EKG in the ER. Today he is NSR, 60bpm. When he was diagnosed with afib he had shortness of breath and weakness, no chest pain. Drinks 16 oz soda daily.No alcohol use. He smokes 1ppd, doesn't plan on quitting.  He has not followed up with GI for colonoscopy or EGD for GIB, and doesn't plan on it. He denies any recurrent bleeding. Denies chest pain, shortness of breath, LLE, orthopnea, pnd. He has PD every night, and gets labs at least once a  month. BP elevated, but he just took his medications. BP at home 170/60s.   Past Medical History:  Diagnosis Date   Acute on chronic systolic congestive heart failure [facility])    Adenocarcinoma of appendix [facility]) Jan 2006   right kidney, s/p cryoablation   Atrial fibrillation with rapid ventricular response ([place]) 05/12/2020   Cardiomyopathy ([place])    a. 12/2018 Echo: EF 40-45%, global HK. Asc Ao 3.7cm;  b. 01/2019 Lexi MV: small, mild, fixed basal and mid antlat defect - scar vs artifact. Small, mild mid and apical inf minimally reversible defect, likely scar w/ peri-infarct ischemia. Coronary and Ao atherosclerosis; c. 12/2019 Echo: EF 40-45%, glob HK, Gr1 DD. Nl RV fxn. Sev dil LA. Mild MR. Mild-mod Ao sclerosis w/o stenosis. Asc Ao 35mm.   Carotid arterial disease ([place])    a. 01/2020 RICA 1-39%, RCCA/RECA < 37%, LICA 8-58%, LCCA/LECA <50%.   Claudication ([place])    a. 02/2020 ABI/TBI: R 1.08/0.95, L 1.00/0.70.   Complication of anesthesia    had to be woken up slowly as his bp was elevated when did this quickly   Diabetes mellitus without complication (HCC)    ESRD (end stage renal disease) ([place])    a. Peritoneal Dialysis pt.   Hyperlipidemia    Hypertension    Migraine    cluster   Neuromuscular disorder ([place])    left lower extrem neuropathy   Obstructive sleep apnea    no OSA since had facial surgery with dr. Kathyrn Sheriff in 1997   PAD (peripheral artery disease) [facility]) Feb 2009   nonobstructing, renal angiogram [name]   Renal cell carcinoma 2004   left kidney heminephrectomy   Renal insufficiency    Sepsis ([place]) 11/06/2020   Stroke ([place])    a. 12/2019 MRI: Small acute infarcts involving the left cerebral hemisphere and several chronic infarcts.   TIA (transient ischemic attack)    no residual but left leg and foot still feel heavy   tobacco abuse     Past Surgical History:  Procedure Laterality Date   CAPD INSERTION N/A 03/01/2019   Procedure: LAPAROSCOPIC INSERTION CONTINUOUS AMBULATORY PERITONEAL DIALYSIS  (CAPD) CATHETER;  Surgeon: Algernon Huxley, MD;  Location: [facility] ORS;  Service: Vascular;  Laterality: N/A;   CARDIAC CATHETERIZATION     Dr. [name] did this to assess his renal artery   cyst removal  12/25/2015   Spine L4 and L5   heminephrectomy  2004   for renal cell CA   RENAL CRYOABLATION  Jan 2006   right kidney,  [name]   sciatica      Current Medications: Current Meds   Medication Sig   albuterol (VENTOLIN HFA) 108 (90 Base) MCG/ACT inhaler Inhale 2 puffs into the lungs every 6 (six) hours as needed for wheezing or shortness of breath.   amLODipine (NORVASC) 10 MG tablet Take 10 mg by mouth daily.   Ascorbic Acid 500 MG CHEW Chew 1 tablet by mouth daily.   aspirin 81 MG tablet Take 1 tablet (81 mg total) by mouth daily.   b complex vitamins capsule Take 1 capsule by mouth daily.   bisoprolol (ZEBETA) 5 MG tablet Take 5 mg by mouth daily.   calcitRIOL (ROCALTROL) 0.25 MCG capsule Take 0.25 mcg by mouth every Monday, Wednesday, and Friday.   cloNIDine (CATAPRES - DOSED IN MG/24 HR) 0.1 mg/24hr patch Place 0.1 mg onto the skin every Friday.   cloNIDine (CATAPRES) 0.2 MG tablet Take 0.2 mg by mouth at bedtime.  diphenhydrAMINE (BENADRYL) 25 MG tablet Take 25 mg by mouth every 6 (six) hours as needed for allergies.   doxazosin (CARDURA) 8 MG tablet Take 8 mg by mouth daily.   ergocalciferol (DRISDOL) 1.25 MG (50000 UT) capsule Take 1 capsule (50,000 Units total) by mouth every 14 (fourteen) days. (Patient taking differently: Take 50,000 Units by mouth once a week.)   fluticasone (FLONASE) 50 MCG/ACT nasal spray Place 2 sprays into both nostrils daily as needed for allergies or rhinitis.   gentamicin cream (GARAMYCIN) 0.1 % Apply 1 application topically daily.   irbesartan (AVAPRO) 300 MG tablet Take 1 tablet (300 mg total) by mouth daily.   isosorbide mononitrate (IMDUR) 30 MG 24 hr tablet Take 30 mg by mouth daily.   multivitamin (RENA-VIT) TABS tablet Take 1 tablet by mouth at bedtime.   rosuvastatin (CRESTOR) 20 MG tablet TAKE 1 TABLET BY MOUTH AT BEDTIME   sevelamer (RENAGEL) 800 MG tablet Take 800 mg by mouth 3 (three) times daily with meals.   tiZANidine (ZANAFLEX) 2 MG tablet Take 1 tablet (2 mg total) by mouth every 12 (twelve) hours as needed for muscle spasms.   torsemide (DEMADEX) 100 MG tablet Take 100 mg by mouth daily.   [DISCONTINUED] diltiazem  (CARDIZEM CD) 180 MG 24 hr capsule Take 1 capsule (180 mg total) by mouth daily.     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  Tobacco Use   Smoking status: Every Day    Packs/day: 1.00    Years: 50.00    Pack years: 50.00    Types: Cigarettes   Smokeless tobacco: Never  Vaping Use   Vaping Use: Former   Devices: tried but did not like  Substance and Sexual Activity   Alcohol use: Not Currently   Drug use: No   Sexual activity: Yes  Other Topics Concern   Not on file  Social History Narrative   Not on file   Social Determinants of Health   Financial Resource Strain: Low Risk    Difficulty of Paying Living Expenses: Not hard at all  Food Insecurity: No Food Insecurity   Worried About Charity fundraiser in the Last Year: Never true   [place] in the Last Year: Never true  Transportation Needs: No Transportation Needs   Lack of Transportation (Medical): No   Lack of Transportation (Non-Medical): No  Physical Activity: Insufficiently Active   Days of Exercise per Week: 3 days   Minutes of Exercise per Session: 20 min  Stress: No Stress Concern Present   Feeling of Stress : Not at all  Social Connections: Not on file     Family History: The patient's family history includes Aneurysm in his maternal grandmother, mother, and paternal grandmother; COPD in his brother; Cancer in his mother; Coronary artery disease in his father and paternal grandfather; Heart attack (age of onset: 27) in his father; Heart disease in his brother and father; Hypertension in his brother, father, and mother; Stroke in his paternal  uncle; Stroke (age of onset: 53) in his father.  ROS:   Please see the history of present  illness.    All other systems reviewed and are negative.  EKGs/Labs/Other Studies Reviewed:    The following studies were reviewed today:  Echo 04/2020  1. Left ventricular ejection fraction, by estimation, is 40 to 45%. The  left ventricle has mildly decreased function. The left ventricle has no  regional wall motion abnormalities. The left ventricular internal cavity  size was mildly dilated. There is  moderate left ventricular hypertrophy. Left ventricular diastolic  parameters are consistent with Grade I diastolic dysfunction (impaired  relaxation). The average left ventricular global longitudinal strain is  -13.7 %. The global longitudinal strain is  abnormal.   2. Right ventricular systolic function is normal. The right ventricular  size is normal. Tricuspid regurgitation signal is inadequate for assessing  PA pressure.   3. Left atrial size was moderately dilated.   4. The mitral valve is normal in structure. Mild mitral valve  regurgitation. No evidence of mitral stenosis.   5. The aortic valve is normal in structure. Aortic valve regurgitation is  mild. Mild to moderate aortic valve sclerosis/calcification is present,  without any evidence of aortic stenosis.   6. Aortic dilatation noted. There is mild dilatation of the aortic root,  measuring 39 mm.   7. The inferior vena cava is dilated in size with >50% respiratory  variability, suggesting right atrial pressure of 8 mmHg.   Heart monitor 02/2020   The patient was monitored for 14 days. The predominant rhythm was sinus with an average rate of 74 bpm (range 47-115 bpm in sinus). There were rare PACs and occasional PVCs. Two episodes of nonsustained ventricular tachycardia lasting up to 6 beats with a maximum rate of 182 bpm occurred. There were 28 atrial runs lasting up to 13 beats with a maximum rate of 169 bpm. No sustained  arrhythmia or prolonged pause was identified. There were no patient triggered events.   Predominately sinus rhythm with rare PACs and occasional PVCs.  Brief NSVT and PSVT also noted.  MPI 01/2019 Narrative & Impression  Abnormal pharmacologic myocardial perfusion stress test. There is a small in size, mild in severity, fixed basal and mid anterolateral defect that most likely represents scar and less likely artifact. There is a small in size, mild in severity, minimally reversible mid and apical inferior defect that most likely represents scar. An element of periinfact ischemia and/or artifact cannot be excluded. The left ventricular ejection fraction is moderately decreased (30-44%) with global hypokinesis. The left ventricle is dilated. Attenuation correction CT shows coronary artery and aortic atherosclerocic calcifications. Multiple bilateral renal hypodensities are noted, incompletely characterized. Hyperdensity along the upper pole of the left kidney is indeterminate. This is an intermediate risk study.      EKG:  EKG is ordered today.  The ekg ordered today demonstrates NSR, LAFB, 60bpm, TWWI III, , LVH with repol abnormality  Recent Labs: 05/11/2020: TSH 1.555 08/06/2020: B Natriuretic Peptide 1,785.3 11/30/2020: ALT 15; BUN 61; Creatinine, Ser 7.31; Hemoglobin 7.9; Magnesium 2.2; Platelets 126; Potassium 3.7; Sodium 140  Recent Lipid Panel    Component Value Date/Time   CHOL 122 05/12/2020 1238   CHOL 159 01/04/2012 0428   TRIG 180 (H) 05/12/2020 1238   TRIG 218 (H) 01/04/2012 0428   HDL 25 (L) 05/12/2020 1238   HDL 17 (L) 01/04/2012 0428   CHOLHDL 4.9 05/12/2020 1238   VLDL 36 05/12/2020 1238   VLDL 44 (H) 01/04/2012 0428   LDLCALC 61 05/12/2020 1238   LDLCALC 98 01/04/2012 0428  LDLDIRECT 134.0 05/11/2017 1450     Physical Exam:    VS:  BP (!) 190/70 (BP Location: Left Arm, Patient Position: Sitting, Cuff Size: Normal)   Pulse 60   Ht 5\' 4"  (1.626 m)   Wt 151  lb 4 oz (68.6 kg)   SpO2 98%   BMI 25.96 kg/m     Wt Readings from Last 3 Encounters:  01/02/21 151 lb 4 oz (68.6 kg)  11/30/20 144 lb 6.4 oz (65.5 kg)  11/21/20 149 lb (67.6 kg)     GEN:  Well nourished, well developed in no acute distress HEENT: Normal NECK: No JVD; No carotid bruits LYMPHATICS: No lymphadenopathy CARDIAC: RRR, + murmur, rubs, gallops RESPIRATORY:  diffusely diminished ABDOMEN: Soft, non-tender, non-distended MUSCULOSKELETAL:  No edema; No deformity  SKIN: Warm and dry NEUROLOGIC:  Alert and oriented x 3 PSYCHIATRIC:  Normal affect   ASSESSMENT:    1. Paroxysmal atrial fibrillation (HCC)   2. Medication management   3. Other restrictive cardiomyopathy (HCC)   4. Paroxysmal A-fib ([place])   5. History of stroke   6. Uncontrolled hypertension   7. ESRD (end stage renal disease) ([place])   8. Chronic HFrEF (heart failure with reduced ejection fraction) (HCC)   9. Mixed hyperlipidemia   10. PAD (peripheral artery disease) ([place])   11. Aortic atherosclerosis ([place])   12. Anemia, unspecified type   13. History of gastrointestinal bleeding    PLAN:    In order of problems listed above:  New Afib in the setting of COVID infection EKG reviewed from ED visit and it is Afib RVR. He was started on Cardizem 180mg  daily for rate control. Today he is SR, 60bpm. He feels much better in SR.  Last echo showed LVEF 40-45%. Check TSH and Mag. Check Echo. CHADSVASC at least 8  (DM, HTN, PAD, CHF, agex2, strokex2). H/o GIB with most recent Hgb 7.9, I will wait on starting anticoagulation. He says he is taking iron supplements, follows closely with nephrology. Recommended patient follow up with GI. Spoke with Dr. [name] regarding plan.   HFmrEF CM LVEF 40-45% Over the last few months R/L heart cath has been discussed for cardiomyopathy and dyspnea on exertion, but recently deferred given GIB. He also had an abnormal stress test in 01/2019. At this point would not be a good candidate  for cath due to low Hgb. No anginal symptoms reported. Continue bisoprolol and ARB. Volume management per PD. If Hgb improves can consider heart cath in the future.   Uncontrolled HTN US kidneys unremarkable. BP elevated at todays visit, but he only took his medications 30 minutes prior to visit. Repeat BP 180/72. At home it is also high. This is difficult given intolerance to many medications (Imdur, hydralazine, Coreg) and CKD. Stop cardizem given low EF. Continue all other medications. Spoke with Dr. [name] regarding medication changes. Patient will follow-up with Dr. Saunders Revel.   PAD/aortic aterosclerosis, h/o stroke Plavix discontinued at time of GIB. Continue Aspirin and statin. May need to discontinue Aspirin given persistent anemia.   ESRD on PD He follows with [facility].  H/o GIB Patient has not followed up with GI, unsure if he will. He denies further bleeding. Hgb continues to drop as above.   Disposition: Follow up in 1-2 month(s) with Dr. Saunders Revel   Signed, [place], PA-C  01/02/2021 1:12 PM    [facility]

## 2021-01-02 ENCOUNTER — Encounter: Payer: Self-pay | Admitting: Medical

## 2021-01-02 ENCOUNTER — Other Ambulatory Visit: Payer: Self-pay

## 2021-01-02 ENCOUNTER — Ambulatory Visit (INDEPENDENT_AMBULATORY_CARE_PROVIDER_SITE_OTHER): Payer: Medicare Other | Admitting: Medical

## 2021-01-02 VITALS — BP 190/70 | HR 60 | Ht 64.0 in | Wt 151.2 lb

## 2021-01-02 DIAGNOSIS — I700 Atherosclerosis of aorta: Secondary | ICD-10-CM

## 2021-01-02 DIAGNOSIS — I5022 Chronic systolic (congestive) heart failure: Secondary | ICD-10-CM

## 2021-01-02 DIAGNOSIS — I10 Essential (primary) hypertension: Secondary | ICD-10-CM

## 2021-01-02 DIAGNOSIS — D631 Anemia in chronic kidney disease: Secondary | ICD-10-CM | POA: Diagnosis not present

## 2021-01-02 DIAGNOSIS — D649 Anemia, unspecified: Secondary | ICD-10-CM | POA: Diagnosis not present

## 2021-01-02 DIAGNOSIS — I480 Paroxysmal atrial fibrillation: Secondary | ICD-10-CM | POA: Diagnosis not present

## 2021-01-02 DIAGNOSIS — E782 Mixed hyperlipidemia: Secondary | ICD-10-CM

## 2021-01-02 DIAGNOSIS — I739 Peripheral vascular disease, unspecified: Secondary | ICD-10-CM

## 2021-01-02 DIAGNOSIS — Z79899 Other long term (current) drug therapy: Secondary | ICD-10-CM

## 2021-01-02 DIAGNOSIS — N186 End stage renal disease: Secondary | ICD-10-CM | POA: Diagnosis not present

## 2021-01-02 DIAGNOSIS — Z992 Dependence on renal dialysis: Secondary | ICD-10-CM | POA: Diagnosis not present

## 2021-01-02 DIAGNOSIS — Z8719 Personal history of other diseases of the digestive system: Secondary | ICD-10-CM | POA: Diagnosis not present

## 2021-01-02 DIAGNOSIS — Z8673 Personal history of transient ischemic attack (TIA), and cerebral infarction without residual deficits: Secondary | ICD-10-CM

## 2021-01-02 DIAGNOSIS — N2581 Secondary hyperparathyroidism of renal origin: Secondary | ICD-10-CM | POA: Diagnosis not present

## 2021-01-02 DIAGNOSIS — D509 Iron deficiency anemia, unspecified: Secondary | ICD-10-CM | POA: Diagnosis not present

## 2021-01-02 DIAGNOSIS — I425 Other restrictive cardiomyopathy: Secondary | ICD-10-CM

## 2021-01-02 NOTE — Patient Instructions (Addendum)
Medication Instructions:   Please STOP Cardizem   *If you need a refill on your cardiac medications before your next appointment, please call your pharmacy*   Lab Work: TSH and Magnesium  LABS WILL APPEAR ON MYCHART, ABNORMAL RESULTS WILL BE CALLED  Testing/Procedures: ECHO   Follow-Up: At [facility], you and your health needs are our priority.  As part of our continuing mission to provide you with exceptional heart care, we have created designated Provider Care Teams.  These Care Teams include your primary Cardiologist (physician) and Advanced Practice Providers (APPs -  Physician Assistants and Nurse Practitioners) who all work together to provide you with the care you need, when you need it.  Your next appointment:   6-8 week(s)  The format for your next appointment:   In Person  Provider:   [name], MD   Other Instructions Your physician has requested that you have an echocardiogram. Echocardiography is a painless test that uses sound waves to create images of your heart. It provides your doctor with information about the size and shape of your heart and how well your heart's chambers and valves are working. This procedure takes approximately one hour. There are no restrictions for this procedure.  There is a possibility that an IV may need to be started during your test to inject an image enhancing agent. This is done to obtain more optimal pictures of your heart. Therefore we ask that you do at least drink some water prior to coming in to hydrate your veins.    Please monitor blood pressures and keep a log of your readings.  Bring in your BP log with you at your next appointment with BP readings for review  How to use a home blood pressure monitor. Be still. Measure at the same time every day. It's important to take the readings at the same time each day, such as morning and evening. Take reading approximately 1 1/2 to 2 hours after BP medications.   AVOID  these things for 30 minutes before checking your blood pressure: Drinking caffeine. Drinking alcohol. Eating. Smoking. Exercising.   Five minutes before checking your blood pressure: Pee. Sit in a dining chair. Avoid sitting in a soft couch or armchair. Be quiet. Do not talk.       Sit correctly. Sit with your back straight and supported (on a dining chair, rather than a sofa). Your feet should be flat on the floor and your legs should not be crossed. Your arm should be supported on a flat surface (such as a table) with the upper arm at heart level. Make sure the bottom of the cuff is placed directly above the bend of the elbow.

## 2021-01-03 DIAGNOSIS — Z992 Dependence on renal dialysis: Secondary | ICD-10-CM | POA: Diagnosis not present

## 2021-01-03 DIAGNOSIS — D631 Anemia in chronic kidney disease: Secondary | ICD-10-CM | POA: Diagnosis not present

## 2021-01-03 DIAGNOSIS — N2581 Secondary hyperparathyroidism of renal origin: Secondary | ICD-10-CM | POA: Diagnosis not present

## 2021-01-03 DIAGNOSIS — N186 End stage renal disease: Secondary | ICD-10-CM | POA: Diagnosis not present

## 2021-01-03 DIAGNOSIS — D509 Iron deficiency anemia, unspecified: Secondary | ICD-10-CM | POA: Diagnosis not present

## 2021-01-03 LAB — TSH: TSH: 3.61 u[IU]/mL (ref 0.450–4.500)

## 2021-01-03 LAB — MAGNESIUM: Magnesium: 2.6 mg/dL — ABNORMAL HIGH (ref 1.6–2.3)

## 2021-01-04 DIAGNOSIS — N2581 Secondary hyperparathyroidism of renal origin: Secondary | ICD-10-CM | POA: Diagnosis not present

## 2021-01-04 DIAGNOSIS — D509 Iron deficiency anemia, unspecified: Secondary | ICD-10-CM | POA: Diagnosis not present

## 2021-01-04 DIAGNOSIS — N186 End stage renal disease: Secondary | ICD-10-CM | POA: Diagnosis not present

## 2021-01-04 DIAGNOSIS — Z992 Dependence on renal dialysis: Secondary | ICD-10-CM | POA: Diagnosis not present

## 2021-01-04 DIAGNOSIS — D631 Anemia in chronic kidney disease: Secondary | ICD-10-CM | POA: Diagnosis not present

## 2021-01-05 DIAGNOSIS — D509 Iron deficiency anemia, unspecified: Secondary | ICD-10-CM | POA: Diagnosis not present

## 2021-01-05 DIAGNOSIS — D631 Anemia in chronic kidney disease: Secondary | ICD-10-CM | POA: Diagnosis not present

## 2021-01-05 DIAGNOSIS — Z992 Dependence on renal dialysis: Secondary | ICD-10-CM | POA: Diagnosis not present

## 2021-01-05 DIAGNOSIS — N186 End stage renal disease: Secondary | ICD-10-CM | POA: Diagnosis not present

## 2021-01-05 DIAGNOSIS — N2581 Secondary hyperparathyroidism of renal origin: Secondary | ICD-10-CM | POA: Diagnosis not present

## 2021-01-06 DIAGNOSIS — D631 Anemia in chronic kidney disease: Secondary | ICD-10-CM | POA: Diagnosis not present

## 2021-01-06 DIAGNOSIS — Z992 Dependence on renal dialysis: Secondary | ICD-10-CM | POA: Diagnosis not present

## 2021-01-06 DIAGNOSIS — N186 End stage renal disease: Secondary | ICD-10-CM | POA: Diagnosis not present

## 2021-01-06 DIAGNOSIS — D509 Iron deficiency anemia, unspecified: Secondary | ICD-10-CM | POA: Diagnosis not present

## 2021-01-06 DIAGNOSIS — N2581 Secondary hyperparathyroidism of renal origin: Secondary | ICD-10-CM | POA: Diagnosis not present

## 2021-01-07 ENCOUNTER — Telehealth: Payer: Self-pay | Admitting: Medical

## 2021-01-07 DIAGNOSIS — D631 Anemia in chronic kidney disease: Secondary | ICD-10-CM | POA: Diagnosis not present

## 2021-01-07 DIAGNOSIS — Z992 Dependence on renal dialysis: Secondary | ICD-10-CM | POA: Diagnosis not present

## 2021-01-07 DIAGNOSIS — N186 End stage renal disease: Secondary | ICD-10-CM | POA: Diagnosis not present

## 2021-01-07 DIAGNOSIS — N2581 Secondary hyperparathyroidism of renal origin: Secondary | ICD-10-CM | POA: Diagnosis not present

## 2021-01-07 DIAGNOSIS — R790 Abnormal level of blood mineral: Secondary | ICD-10-CM

## 2021-01-07 DIAGNOSIS — D509 Iron deficiency anemia, unspecified: Secondary | ICD-10-CM | POA: Diagnosis not present

## 2021-01-07 NOTE — Telephone Encounter (Signed)
Cadence Ninfa Meeker, PA-C  01/07/2021  8:20 AM EDT Back to Top    Normal thyroid function. Magnesium mildly elevated. Please ask if he is taking any magnesium supplements.

## 2021-01-07 NOTE — Telephone Encounter (Signed)
Attempted to call the patient with:  1) Lab results  2) Cadence's recommendations as stated below in regards to her conversation with Dr. Saunders Revel  No answer- I left a message to please call back.

## 2021-01-07 NOTE — Telephone Encounter (Signed)
Furth, Cadence H, PA-C  P Cv Div [place] Triage Please call and let patient know Dr. Saunders Revel reviewed note from last visit. Ideally patient needs to be on a blood thinner for afib and needs cath for mildly reduced pump function, however anemia is limiting this. He is recommended he follow up with GI. Also patient can stop Aspirin as it is not doing much for afib.        Previous Messages   ----- Message -----  From: [name], MD  Sent: 01/05/2021   7:15 AM EDT  To: Cadence Ninfa Meeker, PA-C   Tough situation.  He really needs to get his GI bleeding stuff figured out.  With a-fib and h/o stroke, he should be on apixaban or warfarin.  I guess we can hold aspirin for now, as it probably isn't doing much for a-fib anyway.  I bet he has 3-vessel CAD but as you said, can't cath him right now.   Gerald Stabs  ----- Message -----  From: Antony Madura, PA-C  Sent: 01/02/2021   1:21 PM EDT  To: [name], MD   6wk  f/u fr BP and ED f/u for afib RVR/COVID. Very complicated/difficult situation. Confirmed afib by EKG. He is in SR today. CHADSVASC 8 but due to low Hgb 7.9 and h/o GIB, I will wait on a/c. Stop Dilt given low EF. Continue bisoprolol. Encouraged GI f/u. Further afib work-up  H/o of CM 40-45%, cath held for GIB. Continue to hold on cath.  HTN uncontrolled however difficult due intolerance to many meds. Stop dilt, continue other meds.  Also h/o stroke, palvix stopped. Hgb worsening, should we stop ASA?   F/u with MD  Plan discussed with Dr. [name]

## 2021-01-08 DIAGNOSIS — Z992 Dependence on renal dialysis: Secondary | ICD-10-CM | POA: Diagnosis not present

## 2021-01-08 DIAGNOSIS — N2581 Secondary hyperparathyroidism of renal origin: Secondary | ICD-10-CM | POA: Diagnosis not present

## 2021-01-08 DIAGNOSIS — D509 Iron deficiency anemia, unspecified: Secondary | ICD-10-CM | POA: Diagnosis not present

## 2021-01-08 DIAGNOSIS — N186 End stage renal disease: Secondary | ICD-10-CM | POA: Diagnosis not present

## 2021-01-08 DIAGNOSIS — D631 Anemia in chronic kidney disease: Secondary | ICD-10-CM | POA: Diagnosis not present

## 2021-01-08 NOTE — Telephone Encounter (Signed)
Attempted to call pt. No answer. Lmtcb. See details in below messages.

## 2021-01-09 DIAGNOSIS — D631 Anemia in chronic kidney disease: Secondary | ICD-10-CM | POA: Diagnosis not present

## 2021-01-09 DIAGNOSIS — N186 End stage renal disease: Secondary | ICD-10-CM | POA: Diagnosis not present

## 2021-01-09 DIAGNOSIS — N2581 Secondary hyperparathyroidism of renal origin: Secondary | ICD-10-CM | POA: Diagnosis not present

## 2021-01-09 DIAGNOSIS — D509 Iron deficiency anemia, unspecified: Secondary | ICD-10-CM | POA: Diagnosis not present

## 2021-01-09 DIAGNOSIS — Z992 Dependence on renal dialysis: Secondary | ICD-10-CM | POA: Diagnosis not present

## 2021-01-10 DIAGNOSIS — D509 Iron deficiency anemia, unspecified: Secondary | ICD-10-CM | POA: Diagnosis not present

## 2021-01-10 DIAGNOSIS — Z992 Dependence on renal dialysis: Secondary | ICD-10-CM | POA: Diagnosis not present

## 2021-01-10 DIAGNOSIS — N2581 Secondary hyperparathyroidism of renal origin: Secondary | ICD-10-CM | POA: Diagnosis not present

## 2021-01-10 DIAGNOSIS — D631 Anemia in chronic kidney disease: Secondary | ICD-10-CM | POA: Diagnosis not present

## 2021-01-10 DIAGNOSIS — N186 End stage renal disease: Secondary | ICD-10-CM | POA: Diagnosis not present

## 2021-01-11 DIAGNOSIS — N186 End stage renal disease: Secondary | ICD-10-CM | POA: Diagnosis not present

## 2021-01-11 DIAGNOSIS — D631 Anemia in chronic kidney disease: Secondary | ICD-10-CM | POA: Diagnosis not present

## 2021-01-11 DIAGNOSIS — Z992 Dependence on renal dialysis: Secondary | ICD-10-CM | POA: Diagnosis not present

## 2021-01-11 DIAGNOSIS — D509 Iron deficiency anemia, unspecified: Secondary | ICD-10-CM | POA: Diagnosis not present

## 2021-01-11 DIAGNOSIS — N2581 Secondary hyperparathyroidism of renal origin: Secondary | ICD-10-CM | POA: Diagnosis not present

## 2021-01-12 DIAGNOSIS — N186 End stage renal disease: Secondary | ICD-10-CM | POA: Diagnosis not present

## 2021-01-12 DIAGNOSIS — D509 Iron deficiency anemia, unspecified: Secondary | ICD-10-CM | POA: Diagnosis not present

## 2021-01-12 DIAGNOSIS — Z992 Dependence on renal dialysis: Secondary | ICD-10-CM | POA: Diagnosis not present

## 2021-01-12 DIAGNOSIS — D631 Anemia in chronic kidney disease: Secondary | ICD-10-CM | POA: Diagnosis not present

## 2021-01-12 DIAGNOSIS — N2581 Secondary hyperparathyroidism of renal origin: Secondary | ICD-10-CM | POA: Diagnosis not present

## 2021-01-12 NOTE — Telephone Encounter (Signed)
Spoke with pt's wife Shirlean Mylar, notified of lab results.  Shirlean Mylar confirms that pt does not take Magnesium supplement.  Advised pt follow up with GI as was recommended.  Pt will stop Aspirin as well per recc.

## 2021-01-13 DIAGNOSIS — N2581 Secondary hyperparathyroidism of renal origin: Secondary | ICD-10-CM | POA: Diagnosis not present

## 2021-01-13 DIAGNOSIS — D631 Anemia in chronic kidney disease: Secondary | ICD-10-CM | POA: Diagnosis not present

## 2021-01-13 DIAGNOSIS — N186 End stage renal disease: Secondary | ICD-10-CM | POA: Diagnosis not present

## 2021-01-13 DIAGNOSIS — D509 Iron deficiency anemia, unspecified: Secondary | ICD-10-CM | POA: Diagnosis not present

## 2021-01-13 DIAGNOSIS — Z992 Dependence on renal dialysis: Secondary | ICD-10-CM | POA: Diagnosis not present

## 2021-01-14 DIAGNOSIS — N186 End stage renal disease: Secondary | ICD-10-CM | POA: Diagnosis not present

## 2021-01-14 DIAGNOSIS — Z992 Dependence on renal dialysis: Secondary | ICD-10-CM | POA: Diagnosis not present

## 2021-01-14 DIAGNOSIS — N2581 Secondary hyperparathyroidism of renal origin: Secondary | ICD-10-CM | POA: Diagnosis not present

## 2021-01-14 DIAGNOSIS — D631 Anemia in chronic kidney disease: Secondary | ICD-10-CM | POA: Diagnosis not present

## 2021-01-14 DIAGNOSIS — D509 Iron deficiency anemia, unspecified: Secondary | ICD-10-CM | POA: Diagnosis not present

## 2021-01-15 DIAGNOSIS — N2581 Secondary hyperparathyroidism of renal origin: Secondary | ICD-10-CM | POA: Diagnosis not present

## 2021-01-15 DIAGNOSIS — N186 End stage renal disease: Secondary | ICD-10-CM | POA: Diagnosis not present

## 2021-01-15 DIAGNOSIS — D631 Anemia in chronic kidney disease: Secondary | ICD-10-CM | POA: Diagnosis not present

## 2021-01-15 DIAGNOSIS — D509 Iron deficiency anemia, unspecified: Secondary | ICD-10-CM | POA: Diagnosis not present

## 2021-01-15 DIAGNOSIS — Z992 Dependence on renal dialysis: Secondary | ICD-10-CM | POA: Diagnosis not present

## 2021-01-16 DIAGNOSIS — N186 End stage renal disease: Secondary | ICD-10-CM | POA: Diagnosis not present

## 2021-01-16 DIAGNOSIS — D631 Anemia in chronic kidney disease: Secondary | ICD-10-CM | POA: Diagnosis not present

## 2021-01-16 DIAGNOSIS — D509 Iron deficiency anemia, unspecified: Secondary | ICD-10-CM | POA: Diagnosis not present

## 2021-01-16 DIAGNOSIS — N2581 Secondary hyperparathyroidism of renal origin: Secondary | ICD-10-CM | POA: Diagnosis not present

## 2021-01-16 DIAGNOSIS — Z992 Dependence on renal dialysis: Secondary | ICD-10-CM | POA: Diagnosis not present

## 2021-01-16 NOTE — Telephone Encounter (Signed)
Furth, Cadence H, PA-C  You 2 days ago   Ok we can re-check Mag in a week     Attempted to call pt. No answer. Lmtcb.

## 2021-01-17 DIAGNOSIS — Z992 Dependence on renal dialysis: Secondary | ICD-10-CM | POA: Diagnosis not present

## 2021-01-17 DIAGNOSIS — N2581 Secondary hyperparathyroidism of renal origin: Secondary | ICD-10-CM | POA: Diagnosis not present

## 2021-01-17 DIAGNOSIS — D631 Anemia in chronic kidney disease: Secondary | ICD-10-CM | POA: Diagnosis not present

## 2021-01-17 DIAGNOSIS — N186 End stage renal disease: Secondary | ICD-10-CM | POA: Diagnosis not present

## 2021-01-17 DIAGNOSIS — D509 Iron deficiency anemia, unspecified: Secondary | ICD-10-CM | POA: Diagnosis not present

## 2021-01-18 DIAGNOSIS — D509 Iron deficiency anemia, unspecified: Secondary | ICD-10-CM | POA: Diagnosis not present

## 2021-01-18 DIAGNOSIS — N2581 Secondary hyperparathyroidism of renal origin: Secondary | ICD-10-CM | POA: Diagnosis not present

## 2021-01-18 DIAGNOSIS — N186 End stage renal disease: Secondary | ICD-10-CM | POA: Diagnosis not present

## 2021-01-18 DIAGNOSIS — Z992 Dependence on renal dialysis: Secondary | ICD-10-CM | POA: Diagnosis not present

## 2021-01-18 DIAGNOSIS — D631 Anemia in chronic kidney disease: Secondary | ICD-10-CM | POA: Diagnosis not present

## 2021-01-19 DIAGNOSIS — D631 Anemia in chronic kidney disease: Secondary | ICD-10-CM | POA: Diagnosis not present

## 2021-01-19 DIAGNOSIS — N2581 Secondary hyperparathyroidism of renal origin: Secondary | ICD-10-CM | POA: Diagnosis not present

## 2021-01-19 DIAGNOSIS — D509 Iron deficiency anemia, unspecified: Secondary | ICD-10-CM | POA: Diagnosis not present

## 2021-01-19 DIAGNOSIS — Z992 Dependence on renal dialysis: Secondary | ICD-10-CM | POA: Diagnosis not present

## 2021-01-19 DIAGNOSIS — N186 End stage renal disease: Secondary | ICD-10-CM | POA: Diagnosis not present

## 2021-01-19 NOTE — Telephone Encounter (Signed)
Attempted to call pt. No answer. Lmtcb.  

## 2021-01-20 DIAGNOSIS — D509 Iron deficiency anemia, unspecified: Secondary | ICD-10-CM | POA: Diagnosis not present

## 2021-01-20 DIAGNOSIS — D631 Anemia in chronic kidney disease: Secondary | ICD-10-CM | POA: Diagnosis not present

## 2021-01-20 DIAGNOSIS — N186 End stage renal disease: Secondary | ICD-10-CM | POA: Diagnosis not present

## 2021-01-20 DIAGNOSIS — N2581 Secondary hyperparathyroidism of renal origin: Secondary | ICD-10-CM | POA: Diagnosis not present

## 2021-01-20 DIAGNOSIS — Z992 Dependence on renal dialysis: Secondary | ICD-10-CM | POA: Diagnosis not present

## 2021-01-21 DIAGNOSIS — Z992 Dependence on renal dialysis: Secondary | ICD-10-CM | POA: Diagnosis not present

## 2021-01-21 DIAGNOSIS — D509 Iron deficiency anemia, unspecified: Secondary | ICD-10-CM | POA: Diagnosis not present

## 2021-01-21 DIAGNOSIS — N2581 Secondary hyperparathyroidism of renal origin: Secondary | ICD-10-CM | POA: Diagnosis not present

## 2021-01-21 DIAGNOSIS — D631 Anemia in chronic kidney disease: Secondary | ICD-10-CM | POA: Diagnosis not present

## 2021-01-21 DIAGNOSIS — N186 End stage renal disease: Secondary | ICD-10-CM | POA: Diagnosis not present

## 2021-01-21 NOTE — Telephone Encounter (Signed)
2nd attempt to contact the patient. Lmtcb.

## 2021-01-22 DIAGNOSIS — N186 End stage renal disease: Secondary | ICD-10-CM | POA: Diagnosis not present

## 2021-01-22 DIAGNOSIS — Z992 Dependence on renal dialysis: Secondary | ICD-10-CM | POA: Diagnosis not present

## 2021-01-22 DIAGNOSIS — D509 Iron deficiency anemia, unspecified: Secondary | ICD-10-CM | POA: Diagnosis not present

## 2021-01-22 DIAGNOSIS — D631 Anemia in chronic kidney disease: Secondary | ICD-10-CM | POA: Diagnosis not present

## 2021-01-22 DIAGNOSIS — N2581 Secondary hyperparathyroidism of renal origin: Secondary | ICD-10-CM | POA: Diagnosis not present

## 2021-01-22 NOTE — Telephone Encounter (Signed)
Patient returning call.

## 2021-01-22 NOTE — Telephone Encounter (Signed)
Spoke to pt. Notified of Cadence's recc.  Pt will have repeat magnesium in our office tomorrow 01/23/21 at 2:30 PM.  Lab orders placed.

## 2021-01-22 NOTE — Addendum Note (Signed)
Addended by: Darlyne Russian on: 01/22/2021 10:58 AM   Modules accepted: Orders

## 2021-01-22 NOTE — Addendum Note (Signed)
Addended by: Darlyne Russian on: 01/22/2021 11:44 AM   Modules accepted: Orders

## 2021-01-23 ENCOUNTER — Other Ambulatory Visit: Payer: Self-pay

## 2021-01-23 ENCOUNTER — Other Ambulatory Visit (INDEPENDENT_AMBULATORY_CARE_PROVIDER_SITE_OTHER): Payer: Medicare Other | Admitting: *Deleted

## 2021-01-23 DIAGNOSIS — N186 End stage renal disease: Secondary | ICD-10-CM | POA: Diagnosis not present

## 2021-01-23 DIAGNOSIS — D631 Anemia in chronic kidney disease: Secondary | ICD-10-CM | POA: Diagnosis not present

## 2021-01-23 DIAGNOSIS — R790 Abnormal level of blood mineral: Secondary | ICD-10-CM | POA: Diagnosis not present

## 2021-01-23 DIAGNOSIS — Z992 Dependence on renal dialysis: Secondary | ICD-10-CM | POA: Diagnosis not present

## 2021-01-23 DIAGNOSIS — N2581 Secondary hyperparathyroidism of renal origin: Secondary | ICD-10-CM | POA: Diagnosis not present

## 2021-01-23 DIAGNOSIS — D509 Iron deficiency anemia, unspecified: Secondary | ICD-10-CM | POA: Diagnosis not present

## 2021-01-24 DIAGNOSIS — Z992 Dependence on renal dialysis: Secondary | ICD-10-CM | POA: Diagnosis not present

## 2021-01-24 DIAGNOSIS — D631 Anemia in chronic kidney disease: Secondary | ICD-10-CM | POA: Diagnosis not present

## 2021-01-24 DIAGNOSIS — D509 Iron deficiency anemia, unspecified: Secondary | ICD-10-CM | POA: Diagnosis not present

## 2021-01-24 DIAGNOSIS — N186 End stage renal disease: Secondary | ICD-10-CM | POA: Diagnosis not present

## 2021-01-24 DIAGNOSIS — N2581 Secondary hyperparathyroidism of renal origin: Secondary | ICD-10-CM | POA: Diagnosis not present

## 2021-01-24 LAB — MAGNESIUM: Magnesium: 2.4 mg/dL — ABNORMAL HIGH (ref 1.6–2.3)

## 2021-01-25 DIAGNOSIS — N2581 Secondary hyperparathyroidism of renal origin: Secondary | ICD-10-CM | POA: Diagnosis not present

## 2021-01-25 DIAGNOSIS — N186 End stage renal disease: Secondary | ICD-10-CM | POA: Diagnosis not present

## 2021-01-25 DIAGNOSIS — D631 Anemia in chronic kidney disease: Secondary | ICD-10-CM | POA: Diagnosis not present

## 2021-01-25 DIAGNOSIS — D509 Iron deficiency anemia, unspecified: Secondary | ICD-10-CM | POA: Diagnosis not present

## 2021-01-25 DIAGNOSIS — Z992 Dependence on renal dialysis: Secondary | ICD-10-CM | POA: Diagnosis not present

## 2021-01-26 DIAGNOSIS — N186 End stage renal disease: Secondary | ICD-10-CM | POA: Diagnosis not present

## 2021-01-26 DIAGNOSIS — Z992 Dependence on renal dialysis: Secondary | ICD-10-CM | POA: Diagnosis not present

## 2021-01-26 DIAGNOSIS — D631 Anemia in chronic kidney disease: Secondary | ICD-10-CM | POA: Diagnosis not present

## 2021-01-26 DIAGNOSIS — D509 Iron deficiency anemia, unspecified: Secondary | ICD-10-CM | POA: Diagnosis not present

## 2021-01-26 DIAGNOSIS — N2581 Secondary hyperparathyroidism of renal origin: Secondary | ICD-10-CM | POA: Diagnosis not present

## 2021-01-27 ENCOUNTER — Ambulatory Visit (INDEPENDENT_AMBULATORY_CARE_PROVIDER_SITE_OTHER): Payer: Medicare Other

## 2021-01-27 ENCOUNTER — Other Ambulatory Visit: Payer: Self-pay

## 2021-01-27 DIAGNOSIS — D509 Iron deficiency anemia, unspecified: Secondary | ICD-10-CM | POA: Diagnosis not present

## 2021-01-27 DIAGNOSIS — I480 Paroxysmal atrial fibrillation: Secondary | ICD-10-CM | POA: Diagnosis not present

## 2021-01-27 DIAGNOSIS — Z992 Dependence on renal dialysis: Secondary | ICD-10-CM | POA: Diagnosis not present

## 2021-01-27 DIAGNOSIS — N186 End stage renal disease: Secondary | ICD-10-CM | POA: Diagnosis not present

## 2021-01-27 DIAGNOSIS — N2581 Secondary hyperparathyroidism of renal origin: Secondary | ICD-10-CM | POA: Diagnosis not present

## 2021-01-27 DIAGNOSIS — D631 Anemia in chronic kidney disease: Secondary | ICD-10-CM | POA: Diagnosis not present

## 2021-01-28 DIAGNOSIS — D631 Anemia in chronic kidney disease: Secondary | ICD-10-CM | POA: Diagnosis not present

## 2021-01-28 DIAGNOSIS — D509 Iron deficiency anemia, unspecified: Secondary | ICD-10-CM | POA: Diagnosis not present

## 2021-01-28 DIAGNOSIS — Z992 Dependence on renal dialysis: Secondary | ICD-10-CM | POA: Diagnosis not present

## 2021-01-28 DIAGNOSIS — N2581 Secondary hyperparathyroidism of renal origin: Secondary | ICD-10-CM | POA: Diagnosis not present

## 2021-01-28 DIAGNOSIS — N186 End stage renal disease: Secondary | ICD-10-CM | POA: Diagnosis not present

## 2021-01-28 LAB — ECHOCARDIOGRAM COMPLETE
AR max vel: 2.12 cm2
AV Area VTI: 2.18 cm2
AV Area mean vel: 1.98 cm2
AV Mean grad: 6 mmHg
AV Peak grad: 12 mmHg
Ao pk vel: 1.73 m/s
Area-P 1/2: 2.62 cm2
Calc EF: 41.9 %
P 1/2 time: 613 msec
S' Lateral: 4.6 cm
Single Plane A2C EF: 43.1 %
Single Plane A4C EF: 44.4 %

## 2021-01-29 ENCOUNTER — Telehealth: Payer: Self-pay | Admitting: *Deleted

## 2021-01-29 DIAGNOSIS — N2581 Secondary hyperparathyroidism of renal origin: Secondary | ICD-10-CM | POA: Diagnosis not present

## 2021-01-29 DIAGNOSIS — D509 Iron deficiency anemia, unspecified: Secondary | ICD-10-CM | POA: Diagnosis not present

## 2021-01-29 DIAGNOSIS — N186 End stage renal disease: Secondary | ICD-10-CM | POA: Diagnosis not present

## 2021-01-29 DIAGNOSIS — Z992 Dependence on renal dialysis: Secondary | ICD-10-CM | POA: Diagnosis not present

## 2021-01-29 DIAGNOSIS — D631 Anemia in chronic kidney disease: Secondary | ICD-10-CM | POA: Diagnosis not present

## 2021-01-29 NOTE — Telephone Encounter (Signed)
Attempted to call pt. No answer. Lmtcb.  

## 2021-01-29 NOTE — Telephone Encounter (Signed)
-----   Message from [name], MD sent at 01/27/2021  9:37 PM EDT ----- Magnesium level borderline elevated.  Continue current medications.

## 2021-01-29 NOTE — Telephone Encounter (Signed)
Patient returning call for results.  Ok with detailed msg if no answer on call back.      Patient also aware he can review results via mychart .

## 2021-01-29 NOTE — Telephone Encounter (Signed)
The patient has been notified of the result and verbalized understanding.  All questions (if any) were answered. Darlyne Russian, RN 01/29/2021 11:48 AM

## 2021-01-30 DIAGNOSIS — Z992 Dependence on renal dialysis: Secondary | ICD-10-CM | POA: Diagnosis not present

## 2021-01-30 DIAGNOSIS — N2581 Secondary hyperparathyroidism of renal origin: Secondary | ICD-10-CM | POA: Diagnosis not present

## 2021-01-30 DIAGNOSIS — N186 End stage renal disease: Secondary | ICD-10-CM | POA: Diagnosis not present

## 2021-01-30 DIAGNOSIS — D631 Anemia in chronic kidney disease: Secondary | ICD-10-CM | POA: Diagnosis not present

## 2021-01-30 DIAGNOSIS — D509 Iron deficiency anemia, unspecified: Secondary | ICD-10-CM | POA: Diagnosis not present

## 2021-01-31 DIAGNOSIS — D509 Iron deficiency anemia, unspecified: Secondary | ICD-10-CM | POA: Diagnosis not present

## 2021-01-31 DIAGNOSIS — D631 Anemia in chronic kidney disease: Secondary | ICD-10-CM | POA: Diagnosis not present

## 2021-01-31 DIAGNOSIS — N186 End stage renal disease: Secondary | ICD-10-CM | POA: Diagnosis not present

## 2021-01-31 DIAGNOSIS — N2581 Secondary hyperparathyroidism of renal origin: Secondary | ICD-10-CM | POA: Diagnosis not present

## 2021-01-31 DIAGNOSIS — Z992 Dependence on renal dialysis: Secondary | ICD-10-CM | POA: Diagnosis not present

## 2021-02-01 DIAGNOSIS — D509 Iron deficiency anemia, unspecified: Secondary | ICD-10-CM | POA: Diagnosis not present

## 2021-02-01 DIAGNOSIS — Z992 Dependence on renal dialysis: Secondary | ICD-10-CM | POA: Diagnosis not present

## 2021-02-01 DIAGNOSIS — N186 End stage renal disease: Secondary | ICD-10-CM | POA: Diagnosis not present

## 2021-02-01 DIAGNOSIS — N2581 Secondary hyperparathyroidism of renal origin: Secondary | ICD-10-CM | POA: Diagnosis not present

## 2021-02-01 DIAGNOSIS — D631 Anemia in chronic kidney disease: Secondary | ICD-10-CM | POA: Diagnosis not present

## 2021-02-02 DIAGNOSIS — N186 End stage renal disease: Secondary | ICD-10-CM | POA: Diagnosis not present

## 2021-02-02 DIAGNOSIS — D509 Iron deficiency anemia, unspecified: Secondary | ICD-10-CM | POA: Diagnosis not present

## 2021-02-02 DIAGNOSIS — N2581 Secondary hyperparathyroidism of renal origin: Secondary | ICD-10-CM | POA: Diagnosis not present

## 2021-02-02 DIAGNOSIS — Z992 Dependence on renal dialysis: Secondary | ICD-10-CM | POA: Diagnosis not present

## 2021-02-02 DIAGNOSIS — D631 Anemia in chronic kidney disease: Secondary | ICD-10-CM | POA: Diagnosis not present

## 2021-02-03 DIAGNOSIS — N2581 Secondary hyperparathyroidism of renal origin: Secondary | ICD-10-CM | POA: Diagnosis not present

## 2021-02-03 DIAGNOSIS — Z992 Dependence on renal dialysis: Secondary | ICD-10-CM | POA: Diagnosis not present

## 2021-02-03 DIAGNOSIS — N186 End stage renal disease: Secondary | ICD-10-CM | POA: Diagnosis not present

## 2021-02-03 DIAGNOSIS — D509 Iron deficiency anemia, unspecified: Secondary | ICD-10-CM | POA: Diagnosis not present

## 2021-02-03 DIAGNOSIS — D631 Anemia in chronic kidney disease: Secondary | ICD-10-CM | POA: Diagnosis not present

## 2021-02-04 ENCOUNTER — Ambulatory Visit (INDEPENDENT_AMBULATORY_CARE_PROVIDER_SITE_OTHER): Payer: Medicare Other | Admitting: Urology

## 2021-02-04 ENCOUNTER — Encounter: Payer: Self-pay | Admitting: Urology

## 2021-02-04 ENCOUNTER — Other Ambulatory Visit: Payer: Self-pay

## 2021-02-04 VITALS — BP 187/79 | HR 62 | Ht 66.0 in | Wt 144.0 lb

## 2021-02-04 DIAGNOSIS — D509 Iron deficiency anemia, unspecified: Secondary | ICD-10-CM | POA: Diagnosis not present

## 2021-02-04 DIAGNOSIS — N401 Enlarged prostate with lower urinary tract symptoms: Secondary | ICD-10-CM | POA: Diagnosis not present

## 2021-02-04 DIAGNOSIS — D631 Anemia in chronic kidney disease: Secondary | ICD-10-CM | POA: Diagnosis not present

## 2021-02-04 DIAGNOSIS — N186 End stage renal disease: Secondary | ICD-10-CM | POA: Diagnosis not present

## 2021-02-04 DIAGNOSIS — N281 Cyst of kidney, acquired: Secondary | ICD-10-CM | POA: Diagnosis not present

## 2021-02-04 DIAGNOSIS — N2581 Secondary hyperparathyroidism of renal origin: Secondary | ICD-10-CM | POA: Diagnosis not present

## 2021-02-04 DIAGNOSIS — Z85528 Personal history of other malignant neoplasm of kidney: Secondary | ICD-10-CM | POA: Diagnosis not present

## 2021-02-04 DIAGNOSIS — C61 Malignant neoplasm of prostate: Secondary | ICD-10-CM | POA: Diagnosis not present

## 2021-02-04 DIAGNOSIS — Z992 Dependence on renal dialysis: Secondary | ICD-10-CM | POA: Diagnosis not present

## 2021-02-04 NOTE — Progress Notes (Signed)
02/04/2021 12:50 PM   [name] [birth year] [id number]  Referring provider: Crecencio Mc, MD [place],  [place] [address]  Chief Complaint  Patient presents with   Prostate Cancer    Urologic history: 1.  Low risk prostate cancer Diagnosed 2010 Dr. [name]; 1 core Gleason 3+3 adenocarcinoma (20%) Lost to follow-up; confirmatory biopsy 02/26/2014 with 41 g prostate and no cancer, ASAP, HGPIN PSA bump 12/2019 9.98; repeat 02/28/2020 5.1   2.  History of renal cell carcinoma Partial left nephrectomy Dr. [name] 2004 Cryoablation right renal lesion 2006 On peritoneal dialysis  HPI: 75 y.o. male presents for annual follow-up.  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 12/2018 Echo: EF 40-45%, global HK. Asc Ao 3.7cm; b. 01/2019 Lexi MV: small, mild, fixed basal and mid antlat defect - scar vs artifact. Small, mild mid and apical inf minimally reversible defect, likely scar w/ peri-infarct ischemia. Coronary and Ao atherosclerosis; c. 12/2019 Echo: EF 40-45%, glob HK, Gr1 DD. Nl RV fxn. Sev dil LA. Mild MR. Mild-mod Ao sclerosis w/o stenosis. Asc Ao 48mm.   Carotid arterial disease ([place])    a. 01/2020 RICA 1-39%, RCCA/RECA < 14%, LICA 9-70%, LCCA/LECA <50%.   Claudication ([place])    a. 02/2020 ABI/TBI: R 1.08/0.95, L 1.00/0.70.   Complication of anesthesia    had to be  woken up slowly as his bp was elevated when did this quickly   Diabetes mellitus without complication (HCC)    ESRD (end stage renal disease) ([place])    a. Peritoneal Dialysis pt.   Hyperlipidemia    Hypertension    Migraine    cluster   Neuromuscular disorder ([place])    left lower extrem neuropathy   Obstructive sleep apnea    no OSA since had facial surgery with dr. Kathyrn Sheriff in 1997   PAD (peripheral artery disease) [facility]) Feb 2009   nonobstructing, renal angiogram [name]   Renal cell carcinoma 2004   left kidney heminephrectomy   Renal insufficiency    Sepsis ([place]) 11/06/2020   Stroke ([place])    a. 12/2019 MRI: Small acute infarcts involving the left cerebral hemisphere and several chronic infarcts.   TIA (transient ischemic attack)    no residual but left leg and foot still feel heavy   tobacco abuse     Surgical History: Past Surgical History:  Procedure Laterality Date   CAPD INSERTION N/A 03/01/2019   Procedure: LAPAROSCOPIC INSERTION CONTINUOUS AMBULATORY PERITONEAL DIALYSIS  (CAPD) CATHETER;  Surgeon: Algernon Huxley, MD;  Location: [facility] ORS;  Service: Vascular;  Laterality: N/A;   CARDIAC CATHETERIZATION     Dr. [name] did this to assess his renal artery   cyst removal  12/25/2015   Spine L4 and L5   heminephrectomy  2004   for renal cell CA   RENAL CRYOABLATION  Jan 2006   right kidney,  [name]  sciatica      Home Medications:  Allergies as of 02/04/2021       Reactions   Irbesartan Other (See Comments)   hyperkalemia   Cymbalta [duloxetine Hcl]    Hydralazine Other (See Comments)   headache   Imdur [isosorbide Nitrate] Other (See Comments)   headache   Atorvastatin Other (See Comments)   Muscle pain   Bystolic [nebivolol Hcl]    Extreme fatigue         Medication List        Accurate as of February 04, 2021 12:50 PM. If you have any questions, ask your nurse or doctor.          albuterol 108 (90 Base) MCG/ACT inhaler Commonly known as: VENTOLIN  HFA Inhale 2 puffs into the lungs every 6 (six) hours as needed for wheezing or shortness of breath.   amLODipine 10 MG tablet Commonly known as: NORVASC Take 10 mg by mouth daily.   Ascorbic Acid 500 MG Chew Chew 1 tablet by mouth daily.   b complex vitamins capsule Take 1 capsule by mouth daily.   bisoprolol 5 MG tablet Commonly known as: ZEBETA Take 5 mg by mouth daily.   calcitRIOL 0.25 MCG capsule Commonly known as: ROCALTROL Take 0.25 mcg by mouth every Monday, Wednesday, and Friday.   cloNIDine 0.1 mg/24hr patch Commonly known as: CATAPRES - Dosed in mg/24 hr Place 0.1 mg onto the skin every Friday.   cloNIDine 0.2 MG tablet Commonly known as: CATAPRES Take 0.2 mg by mouth at bedtime.   diphenhydrAMINE 25 MG tablet Commonly known as: BENADRYL Take 25 mg by mouth every 6 (six) hours as needed for allergies.   doxazosin 8 MG tablet Commonly known as: CARDURA Take 8 mg by mouth daily.   ergocalciferol 1.25 MG (50000 UT) capsule Commonly known as: Drisdol Take 1 capsule (50,000 Units total) by mouth every 14 (fourteen) days. What changed: when to take this   fluticasone 50 MCG/ACT nasal spray Commonly known as: FLONASE Place 2 sprays into both nostrils daily as needed for allergies or rhinitis.   gentamicin cream 0.1 % Commonly known as: GARAMYCIN Apply 1 application topically daily.   irbesartan 300 MG tablet Commonly known as: AVAPRO Take 1 tablet (300 mg total) by mouth daily.   isosorbide mononitrate 30 MG 24 hr tablet Commonly known as: IMDUR Take 30 mg by mouth daily.   multivitamin Tabs tablet Take 1 tablet by mouth at bedtime.   rosuvastatin 20 MG tablet Commonly known as: CRESTOR TAKE 1 TABLET BY MOUTH AT BEDTIME   sevelamer 800 MG tablet Commonly known as: RENAGEL Take 800 mg by mouth 3 (three) times daily with meals.   tiZANidine 2 MG tablet Commonly known as: ZANAFLEX Take 1 tablet (2 mg total) by mouth every 12 (twelve) hours as  needed for muscle spasms.   torsemide 100 MG tablet Commonly known as: DEMADEX Take 100 mg by mouth daily.        Allergies:  Allergies  Allergen Reactions   Irbesartan Other (See Comments)    hyperkalemia   Cymbalta [Duloxetine Hcl]    Hydralazine Other (See Comments)    headache   Imdur [Isosorbide Nitrate] Other (See Comments)    headache   Atorvastatin Other (See Comments)    Muscle pain   Bystolic [Nebivolol Hcl]     Extreme fatigue     Family History: Family History  Problem Relation Age of Onset   Hypertension Mother  Cancer Mother        breast   Aneurysm Mother    Coronary artery disease Father    Hypertension Father    Stroke Father 40   Heart disease Father    Heart attack Father 51   Aneurysm Maternal Grandmother        brain   Aneurysm Paternal Grandmother        brain   Coronary artery disease Paternal Grandfather    Heart disease Brother        valvular heart disease   COPD Brother    Hypertension Brother    Stroke Paternal Uncle     Social History:  reports that he has been smoking cigarettes. He has a 50.00 pack-year smoking history. He has never used smokeless tobacco. He reports that he does not currently use alcohol. He reports that he does not use drugs.   Physical Exam: BP (!) 187/79   Pulse 62   Ht 5\' 6"  (1.676 m)   Wt 144 lb (65.3 kg)   BMI 23.24 kg/m   Constitutional:  Alert and oriented, No acute distress. HEENT: [place] AT, moist mucus membranes.  Trachea midline, no masses. Cardiovascular: No clubbing, cyanosis, or edema. Skin: No rashes, bruises or suspicious lesions. GU: Prostate 40 g, smooth without nodules Neurologic: Grossly intact, no focal deficits, moving all 4 extremities. Psychiatric: Normal mood and affect.   Assessment & Plan:    1.  Low risk prostate cancer Intermittent PSA elevation Recent prostate MRI showed no suspicious lesions He elected to continue surveillance Annual follow-up  2.  BPH with  LUTS Stable on doxazosin  3.  History of renal cell carcinoma He did have a CT performed in the ED 11/06/2020 which showed multiple renal lesions some hyperdense.  Follow-up MRI was recommended and will order     Abbie Sons, MD  [facility] [address], [place], [place] [address] [phone]

## 2021-02-05 DIAGNOSIS — Z992 Dependence on renal dialysis: Secondary | ICD-10-CM | POA: Diagnosis not present

## 2021-02-05 DIAGNOSIS — D631 Anemia in chronic kidney disease: Secondary | ICD-10-CM | POA: Diagnosis not present

## 2021-02-05 DIAGNOSIS — D509 Iron deficiency anemia, unspecified: Secondary | ICD-10-CM | POA: Diagnosis not present

## 2021-02-05 DIAGNOSIS — N2581 Secondary hyperparathyroidism of renal origin: Secondary | ICD-10-CM | POA: Diagnosis not present

## 2021-02-05 DIAGNOSIS — N186 End stage renal disease: Secondary | ICD-10-CM | POA: Diagnosis not present

## 2021-02-06 DIAGNOSIS — N2581 Secondary hyperparathyroidism of renal origin: Secondary | ICD-10-CM | POA: Diagnosis not present

## 2021-02-06 DIAGNOSIS — Z992 Dependence on renal dialysis: Secondary | ICD-10-CM | POA: Diagnosis not present

## 2021-02-06 DIAGNOSIS — D509 Iron deficiency anemia, unspecified: Secondary | ICD-10-CM | POA: Diagnosis not present

## 2021-02-06 DIAGNOSIS — N186 End stage renal disease: Secondary | ICD-10-CM | POA: Diagnosis not present

## 2021-02-06 DIAGNOSIS — D631 Anemia in chronic kidney disease: Secondary | ICD-10-CM | POA: Diagnosis not present

## 2021-02-07 DIAGNOSIS — D509 Iron deficiency anemia, unspecified: Secondary | ICD-10-CM | POA: Diagnosis not present

## 2021-02-07 DIAGNOSIS — N186 End stage renal disease: Secondary | ICD-10-CM | POA: Diagnosis not present

## 2021-02-07 DIAGNOSIS — Z992 Dependence on renal dialysis: Secondary | ICD-10-CM | POA: Diagnosis not present

## 2021-02-07 DIAGNOSIS — D631 Anemia in chronic kidney disease: Secondary | ICD-10-CM | POA: Diagnosis not present

## 2021-02-07 DIAGNOSIS — N2581 Secondary hyperparathyroidism of renal origin: Secondary | ICD-10-CM | POA: Diagnosis not present

## 2021-02-08 ENCOUNTER — Encounter: Payer: Self-pay | Admitting: Urology

## 2021-02-08 DIAGNOSIS — N186 End stage renal disease: Secondary | ICD-10-CM | POA: Diagnosis not present

## 2021-02-08 DIAGNOSIS — N281 Cyst of kidney, acquired: Secondary | ICD-10-CM | POA: Insufficient documentation

## 2021-02-08 DIAGNOSIS — Z992 Dependence on renal dialysis: Secondary | ICD-10-CM | POA: Diagnosis not present

## 2021-02-08 DIAGNOSIS — D509 Iron deficiency anemia, unspecified: Secondary | ICD-10-CM | POA: Diagnosis not present

## 2021-02-08 DIAGNOSIS — N2581 Secondary hyperparathyroidism of renal origin: Secondary | ICD-10-CM | POA: Diagnosis not present

## 2021-02-08 DIAGNOSIS — N401 Enlarged prostate with lower urinary tract symptoms: Secondary | ICD-10-CM | POA: Insufficient documentation

## 2021-02-08 DIAGNOSIS — D631 Anemia in chronic kidney disease: Secondary | ICD-10-CM | POA: Diagnosis not present

## 2021-02-09 DIAGNOSIS — N186 End stage renal disease: Secondary | ICD-10-CM | POA: Diagnosis not present

## 2021-02-09 DIAGNOSIS — D509 Iron deficiency anemia, unspecified: Secondary | ICD-10-CM | POA: Diagnosis not present

## 2021-02-09 DIAGNOSIS — D631 Anemia in chronic kidney disease: Secondary | ICD-10-CM | POA: Diagnosis not present

## 2021-02-09 DIAGNOSIS — Z992 Dependence on renal dialysis: Secondary | ICD-10-CM | POA: Diagnosis not present

## 2021-02-09 DIAGNOSIS — N2581 Secondary hyperparathyroidism of renal origin: Secondary | ICD-10-CM | POA: Diagnosis not present

## 2021-02-10 DIAGNOSIS — N186 End stage renal disease: Secondary | ICD-10-CM | POA: Diagnosis not present

## 2021-02-10 DIAGNOSIS — D509 Iron deficiency anemia, unspecified: Secondary | ICD-10-CM | POA: Diagnosis not present

## 2021-02-10 DIAGNOSIS — Z992 Dependence on renal dialysis: Secondary | ICD-10-CM | POA: Diagnosis not present

## 2021-02-10 DIAGNOSIS — N2581 Secondary hyperparathyroidism of renal origin: Secondary | ICD-10-CM | POA: Diagnosis not present

## 2021-02-10 DIAGNOSIS — D631 Anemia in chronic kidney disease: Secondary | ICD-10-CM | POA: Diagnosis not present

## 2021-02-11 DIAGNOSIS — N186 End stage renal disease: Secondary | ICD-10-CM | POA: Diagnosis not present

## 2021-02-11 DIAGNOSIS — D509 Iron deficiency anemia, unspecified: Secondary | ICD-10-CM | POA: Diagnosis not present

## 2021-02-11 DIAGNOSIS — Z992 Dependence on renal dialysis: Secondary | ICD-10-CM | POA: Diagnosis not present

## 2021-02-11 DIAGNOSIS — N2581 Secondary hyperparathyroidism of renal origin: Secondary | ICD-10-CM | POA: Diagnosis not present

## 2021-02-11 DIAGNOSIS — D631 Anemia in chronic kidney disease: Secondary | ICD-10-CM | POA: Diagnosis not present

## 2021-02-12 ENCOUNTER — Ambulatory Visit: Payer: Self-pay | Admitting: *Deleted

## 2021-02-12 DIAGNOSIS — D631 Anemia in chronic kidney disease: Secondary | ICD-10-CM | POA: Diagnosis not present

## 2021-02-12 DIAGNOSIS — N186 End stage renal disease: Secondary | ICD-10-CM | POA: Diagnosis not present

## 2021-02-12 DIAGNOSIS — N2581 Secondary hyperparathyroidism of renal origin: Secondary | ICD-10-CM | POA: Diagnosis not present

## 2021-02-12 DIAGNOSIS — Z992 Dependence on renal dialysis: Secondary | ICD-10-CM | POA: Diagnosis not present

## 2021-02-12 DIAGNOSIS — D509 Iron deficiency anemia, unspecified: Secondary | ICD-10-CM | POA: Diagnosis not present

## 2021-02-12 NOTE — Patient Instructions (Signed)
Visit Information ? ?Thank you for allowing me to share the care management and care coordination services that are available to you as part of your health plan and services through your primary care provider and medical home. Please reach out to me at [phone]   if the care management/care coordination team may be of assistance to you in the future.  ? ?Darthula Desa RN, MSN ?RN Care Management Coordinator ?[facility] ?[phone] ?[email or web address] ? ?

## 2021-02-12 NOTE — Chronic Care Management (AMB) (Signed)
  Care Management   Follow Up Note   02/12/2021 Name: RACHARD ISIDRO MRN: [id number] DOB: 1946/05/23   Referred by: Crecencio Mc, MD Reason for referral : Chronic Care Management (Case Closure)   Third unsuccessful telephone outreach was attempted today. The patient was referred to the case management team for assistance with care management and care coordination. The patient's primary care provider has been notified of our unsuccessful attempts to make or maintain contact with the patient. The care management team is pleased to engage with this patient at any time in the future should he/she be interested in assistance from the care management team.   Follow Up Plan: We have been unable to make contact with the patient for follow up. The care management team is available to follow up with the patient after provider conversation with the patient regarding recommendation for care management engagement and subsequent re-referral to the care management team.   Hubert Azure RN, MSN RN Care Management Coordinator [place] [phone] [email or web address]

## 2021-02-13 DIAGNOSIS — D509 Iron deficiency anemia, unspecified: Secondary | ICD-10-CM | POA: Diagnosis not present

## 2021-02-13 DIAGNOSIS — D631 Anemia in chronic kidney disease: Secondary | ICD-10-CM | POA: Diagnosis not present

## 2021-02-13 DIAGNOSIS — Z992 Dependence on renal dialysis: Secondary | ICD-10-CM | POA: Diagnosis not present

## 2021-02-13 DIAGNOSIS — N2581 Secondary hyperparathyroidism of renal origin: Secondary | ICD-10-CM | POA: Diagnosis not present

## 2021-02-13 DIAGNOSIS — N186 End stage renal disease: Secondary | ICD-10-CM | POA: Diagnosis not present

## 2021-02-14 DIAGNOSIS — D509 Iron deficiency anemia, unspecified: Secondary | ICD-10-CM | POA: Diagnosis not present

## 2021-02-14 DIAGNOSIS — N2581 Secondary hyperparathyroidism of renal origin: Secondary | ICD-10-CM | POA: Diagnosis not present

## 2021-02-14 DIAGNOSIS — N186 End stage renal disease: Secondary | ICD-10-CM | POA: Diagnosis not present

## 2021-02-14 DIAGNOSIS — D631 Anemia in chronic kidney disease: Secondary | ICD-10-CM | POA: Diagnosis not present

## 2021-02-14 DIAGNOSIS — Z992 Dependence on renal dialysis: Secondary | ICD-10-CM | POA: Diagnosis not present

## 2021-02-15 DIAGNOSIS — N186 End stage renal disease: Secondary | ICD-10-CM | POA: Diagnosis not present

## 2021-02-15 DIAGNOSIS — Z992 Dependence on renal dialysis: Secondary | ICD-10-CM | POA: Diagnosis not present

## 2021-02-15 DIAGNOSIS — D631 Anemia in chronic kidney disease: Secondary | ICD-10-CM | POA: Diagnosis not present

## 2021-02-15 DIAGNOSIS — D509 Iron deficiency anemia, unspecified: Secondary | ICD-10-CM | POA: Diagnosis not present

## 2021-02-15 DIAGNOSIS — N2581 Secondary hyperparathyroidism of renal origin: Secondary | ICD-10-CM | POA: Diagnosis not present

## 2021-02-16 DIAGNOSIS — D631 Anemia in chronic kidney disease: Secondary | ICD-10-CM | POA: Diagnosis not present

## 2021-02-16 DIAGNOSIS — D509 Iron deficiency anemia, unspecified: Secondary | ICD-10-CM | POA: Diagnosis not present

## 2021-02-16 DIAGNOSIS — N186 End stage renal disease: Secondary | ICD-10-CM | POA: Diagnosis not present

## 2021-02-16 DIAGNOSIS — N2581 Secondary hyperparathyroidism of renal origin: Secondary | ICD-10-CM | POA: Diagnosis not present

## 2021-02-16 DIAGNOSIS — Z992 Dependence on renal dialysis: Secondary | ICD-10-CM | POA: Diagnosis not present

## 2021-02-17 ENCOUNTER — Ambulatory Visit (INDEPENDENT_AMBULATORY_CARE_PROVIDER_SITE_OTHER): Payer: Medicare Other

## 2021-02-17 ENCOUNTER — Ambulatory Visit (INDEPENDENT_AMBULATORY_CARE_PROVIDER_SITE_OTHER): Payer: Medicare Other | Admitting: Vascular Surgery

## 2021-02-17 ENCOUNTER — Other Ambulatory Visit: Payer: Self-pay

## 2021-02-17 ENCOUNTER — Encounter (INDEPENDENT_AMBULATORY_CARE_PROVIDER_SITE_OTHER): Payer: Self-pay | Admitting: Vascular Surgery

## 2021-02-17 VITALS — BP 193/85 | HR 73 | Resp 16 | Wt 148.6 lb

## 2021-02-17 DIAGNOSIS — N186 End stage renal disease: Secondary | ICD-10-CM

## 2021-02-17 DIAGNOSIS — I10 Essential (primary) hypertension: Secondary | ICD-10-CM

## 2021-02-17 DIAGNOSIS — I6529 Occlusion and stenosis of unspecified carotid artery: Secondary | ICD-10-CM | POA: Insufficient documentation

## 2021-02-17 DIAGNOSIS — I6523 Occlusion and stenosis of bilateral carotid arteries: Secondary | ICD-10-CM | POA: Diagnosis not present

## 2021-02-17 DIAGNOSIS — N2581 Secondary hyperparathyroidism of renal origin: Secondary | ICD-10-CM | POA: Diagnosis not present

## 2021-02-17 DIAGNOSIS — Z992 Dependence on renal dialysis: Secondary | ICD-10-CM

## 2021-02-17 DIAGNOSIS — E1122 Type 2 diabetes mellitus with diabetic chronic kidney disease: Secondary | ICD-10-CM | POA: Diagnosis not present

## 2021-02-17 DIAGNOSIS — I7779 Dissection of other artery: Secondary | ICD-10-CM | POA: Diagnosis not present

## 2021-02-17 DIAGNOSIS — D509 Iron deficiency anemia, unspecified: Secondary | ICD-10-CM | POA: Diagnosis not present

## 2021-02-17 DIAGNOSIS — D631 Anemia in chronic kidney disease: Secondary | ICD-10-CM | POA: Diagnosis not present

## 2021-02-17 DIAGNOSIS — E785 Hyperlipidemia, unspecified: Secondary | ICD-10-CM | POA: Diagnosis not present

## 2021-02-17 NOTE — Assessment & Plan Note (Signed)
Duplex today demonstrates velocities just into the 40 to 59% range in the right carotid artery and in the 1 to 39% range in the left carotid artery.  Both subclavian arteries have some moderately elevated velocities and he has a known previous history of left subclavian dissection.  The right velocities are actually higher than the left.  No role for intervention at these levels.  We can continue to monitor on an annual basis unless he develops symptoms.

## 2021-02-17 NOTE — Progress Notes (Signed)
MRN : [id number]  [name] is a 75 y.o. (Feb 01, 1946) male who presents with chief complaint of  Chief Complaint  Patient presents with   Follow-up    Ultrasound follow up  .  History of Present Illness: Patient returns in follow-up of his carotid and subclavian disease.  He is doing well.  He denies any complaints or problems today.  He has a Scientist, product/process development of medical issues.  He has end-stage renal disease on dialysis.  He has significant cardiac dysfunction.  No recent focal neurologic symptoms.  No significant claudication symptoms in the arms or vertebrobasilar symptoms. Duplex today demonstrates velocities just into the 40 to 59% range in the right carotid artery and in the 1 to 39% range in the left carotid artery.  Both subclavian arteries have some moderately elevated velocities and he has a known previous history of left subclavian dissection.  The right velocities are actually higher than the left.    Current Outpatient Medications  Medication Sig Dispense Refill   albuterol (VENTOLIN HFA) 108 (90 Base) MCG/ACT inhaler Inhale 2 puffs into the lungs every 6 (six) hours as needed for wheezing or shortness of breath.     amLODipine (NORVASC) 10 MG tablet Take 10 mg by mouth daily.     Ascorbic Acid 500 MG CHEW Chew 1 tablet by mouth daily.     b complex vitamins capsule Take 1 capsule by mouth daily.     bisoprolol (ZEBETA) 5 MG tablet Take 5 mg by mouth daily.     calcitRIOL (ROCALTROL) 0.25 MCG capsule Take 0.25 mcg by mouth every Monday, Wednesday, and Friday.  3   cloNIDine (CATAPRES - DOSED IN MG/24 HR) 0.1 mg/24hr patch Place 0.1 mg onto the skin every Friday.     cloNIDine (CATAPRES) 0.2 MG tablet Take 0.2 mg by mouth at bedtime.     diphenhydrAMINE (BENADRYL) 25 MG tablet Take 25 mg by mouth every 6 (six) hours as needed for allergies.     doxazosin (CARDURA) 8 MG tablet Take 8 mg by mouth daily.     ergocalciferol (DRISDOL) 1.25 MG (50000 UT) capsule Take 1 capsule (50,000  Units total) by mouth every 14 (fourteen) days. (Patient taking differently: Take 50,000 Units by mouth once a week.) 6 capsule 0   fluticasone (FLONASE) 50 MCG/ACT nasal spray Place 2 sprays into both nostrils daily as needed for allergies or rhinitis.     gentamicin cream (GARAMYCIN) 0.1 % Apply 1 application topically daily. 15 g 0   irbesartan (AVAPRO) 300 MG tablet Take 1 tablet (300 mg total) by mouth daily. 30 tablet 0   isosorbide mononitrate (IMDUR) 30 MG 24 hr tablet Take 30 mg by mouth daily.     multivitamin (RENA-VIT) TABS tablet Take 1 tablet by mouth at bedtime.     rosuvastatin (CRESTOR) 20 MG tablet TAKE 1 TABLET BY MOUTH AT BEDTIME 90 tablet 0   sevelamer (RENAGEL) 800 MG tablet Take 800 mg by mouth 3 (three) times daily with meals.     tiZANidine (ZANAFLEX) 2 MG tablet Take 1 tablet (2 mg total) by mouth every 12 (twelve) hours as needed for muscle spasms. 60 tablet 0   torsemide (DEMADEX) 100 MG tablet Take 100 mg by mouth daily.     No current facility-administered medications for this visit.    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 12/2018 Echo: EF 40-45%, global HK. Asc Ao 3.7cm; b. 01/2019 [place] MV: small, mild, fixed basal and mid antlat defect - scar vs artifact. Small, mild mid and apical inf minimally reversible defect, likely scar w/ peri-infarct ischemia. Coronary and Ao atherosclerosis; c. 12/2019 Echo: EF 40-45%, glob HK, Gr1 DD. Nl RV fxn. Sev dil LA. Mild MR. Mild-mod Ao sclerosis w/o stenosis. Asc Ao 63mm.   Carotid arterial disease ([place])    a. 01/2020 RICA 1-39%, RCCA/RECA < 22%, LICA 6-33%, LCCA/LECA <50%.   Claudication ([place])    a. 02/2020 ABI/TBI: R 1.08/0.95, L 1.00/0.70.   Complication of anesthesia    had to be woken up  slowly as his bp was elevated when did this quickly   Diabetes mellitus without complication (HCC)    ESRD (end stage renal disease) ([place])    a. Peritoneal Dialysis pt.   Hyperlipidemia    Hypertension    Migraine    cluster   Neuromuscular disorder ([place])    left lower extrem neuropathy   Obstructive sleep apnea    no OSA since had facial surgery with dr. Kathyrn Sheriff in 1997   PAD (peripheral artery disease) [facility]) Feb 2009   nonobstructing, renal angiogram [name]   Renal cell carcinoma 2004   left kidney heminephrectomy   Renal insufficiency    Sepsis ([place]) 11/06/2020   Stroke ([place])    a. 12/2019 MRI: Small acute infarcts involving the left cerebral hemisphere and several chronic infarcts.   TIA (transient ischemic attack)    no residual but left leg and foot still feel heavy   tobacco abuse     Past Surgical History:  Procedure Laterality Date   CAPD INSERTION N/A 03/01/2019   Procedure: LAPAROSCOPIC INSERTION CONTINUOUS AMBULATORY PERITONEAL DIALYSIS  (CAPD) CATHETER;  Surgeon: Algernon Huxley, MD;  Location: [facility] ORS;  Service: Vascular;  Laterality: N/A;   CARDIAC CATHETERIZATION     Dr. [name] did this to assess his renal artery   cyst removal  12/25/2015   Spine L4 and L5   heminephrectomy  2004   for renal cell CA   RENAL CRYOABLATION  Jan 2006   right kidney,  [name]   sciatica       Social History   Tobacco Use   Smoking status: Every Day    Packs/day: 1.00    Years: 50.00    Pack years: 50.00    Types: Cigarettes   Smokeless tobacco: Never  Vaping Use   Vaping Use: Former   Devices: tried but did not like  Substance Use Topics   Alcohol use: Not Currently   Drug use: No      Family History  Problem Relation Age of Onset   Hypertension Mother    Cancer Mother        breast   Aneurysm Mother    Coronary artery disease Father    Hypertension Father    Stroke Father 63   Heart disease Father    Heart attack Father 64   Aneurysm Maternal Grandmother         brain   Aneurysm Paternal Grandmother        brain   Coronary artery disease Paternal Grandfather    Heart disease Brother        valvular heart disease   COPD Brother    Hypertension Brother  Stroke Paternal Uncle      Allergies  Allergen Reactions   Irbesartan Other (See Comments)    hyperkalemia   Cymbalta [Duloxetine Hcl]    Hydralazine Other (See Comments)    headache   Imdur [Isosorbide Nitrate] Other (See Comments)    headache   Atorvastatin Other (See Comments)    Muscle pain   Bystolic [Nebivolol Hcl]     Extreme fatigue     REVIEW OF SYSTEMS (Negative unless checked)   Constitutional: [] Weight loss  [] Fever  [] Chills Cardiac: [] Chest pain   [] Chest pressure   [] Palpitations   [] Shortness of breath when laying flat   [] Shortness of breath at rest   [x] Shortness of breath with exertion. Vascular:  [x] Pain in legs with walking   [] Pain in legs at rest   [] Pain in legs when laying flat   [] Claudication   [] Pain in feet when walking  [] Pain in feet at rest  [] Pain in feet when laying flat   [] History of DVT   [] Phlebitis   [] Swelling in legs   [] Varicose veins   [] Non-healing ulcers Pulmonary:   [] Uses home oxygen   [] Productive cough   [] Hemoptysis   [] Wheeze  [] COPD   [] Asthma Neurologic:  [] Dizziness  [] Blackouts   [] Seizures   [x] History of stroke   [x] History of TIA  [] Aphasia   [] Temporary blindness   [] Dysphagia   [] Weakness or numbness in arms   [x] Weakness or numbness in legs Musculoskeletal:  [] Arthritis   [] Joint swelling   [] Joint pain   [] Low back pain Hematologic:  [] Easy bruising  [] Easy bleeding   [] Hypercoagulable state   [x] Anemic  [] Hepatitis Gastrointestinal:  [] Blood in stool   [] Vomiting blood  [] Gastroesophageal reflux/heartburn   [] Difficulty swallowing. Genitourinary:  [x] Chronic kidney disease   [] Difficult urination  [] Frequent urination  [] Burning with urination   [] Blood in urine Skin:  [] Rashes   [] Ulcers   [] Wounds Psychological:   [] History of anxiety   []  History of major depression.   Physical Examination  Vitals:   02/17/21 1345  BP: (!) 193/85  Pulse: 73  Resp: 16  Weight: 148 lb 9.6 oz (67.4 kg)   Body mass index is 23.98 kg/m. Gen:  WD/WN, NAD Head: [place]/AT, No temporalis wasting. Ear/Nose/Throat: Hearing grossly intact, nares w/o erythema or drainage, trachea midline Eyes: Conjunctiva clear. Sclera non-icteric Neck: Supple.  No bruit  Pulmonary:  Good air movement, equal and clear to auscultation bilaterally.  Cardiac: somewhat irregular Vascular:  Vessel Right Left  Radial Palpable Palpable           Musculoskeletal: M/S 5/5 throughout.  No deformity or atrophy. Mild LE edema. Neurologic: CN 2-12 intact. Sensation grossly intact in extremities.  Symmetrical.  Speech is fluent. Motor exam as listed above. Psychiatric: Judgment intact, Mood & affect appropriate for pt's clinical situation. Dermatologic: No rashes or ulcers noted.  No cellulitis or open wounds.     CBC Lab Results  Component Value Date   WBC 5.7 11/30/2020   HGB 7.9 (L) 11/30/2020   HCT 23.2 (L) 11/30/2020   MCV 91.0 11/30/2020   PLT 126 (L) 11/30/2020    BMET    Component Value Date/Time   NA 140 11/30/2020 0529   NA 136 (A) 04/19/2018 0000   NA 139 07/04/2012 1117   K 3.7 11/30/2020 0529   K 4.3 07/04/2012 1117   CL 109 11/30/2020 0529   CL 102 07/04/2012 1117   CO2 24 11/30/2020 0529   CO2 29  07/04/2012 1117   GLUCOSE 97 11/30/2020 0529   GLUCOSE 88 07/04/2012 1117   BUN 61 (H) 11/30/2020 0529   BUN 44 (A) 04/19/2018 0000   BUN 15 07/04/2012 1117   CREATININE 7.31 (H) 11/30/2020 0529   CREATININE 1.92 (H) 07/18/2015 1549   CALCIUM 8.1 (L) 11/30/2020 0529   CALCIUM 9.1 07/04/2012 1117   GFRNONAA 7 (L) 11/30/2020 0529   GFRNONAA 40 (L) 07/04/2012 1117   GFRAA 7 (L) 01/14/2020 0813   GFRAA 47 (L) 07/04/2012 1117   CrCl cannot be calculated (Patient's most recent lab result is older than the maximum 21  days allowed.).  COAG Lab Results  Component Value Date   INR 1.1 11/06/2020   INR 1.0 01/14/2020   INR 1.1 02/26/2019    Radiology ECHOCARDIOGRAM COMPLETE  Result Date: 01/28/2021    ECHOCARDIOGRAM REPORT   Patient Name:   CULLAN LAUNER Date of Exam: 01/27/2021 Medical Rec #:  [id number]       Height:       64.0 in Accession #:    [id number]      Weight:       151.2 lb Date of Birth:  January 26, 1946       BSA:          1.737 m Patient Age:    52 years        BP:           150/89 mmHg Patient Gender: M               HR:           66 bpm. Exam Location:  [place] Procedure: Cardiac Doppler, Color Doppler and Strain Analysis Indications:    I48.0 Paroxysmal atrial fibrillation  History:        Patient has prior history of Echocardiogram examinations, most                 recent 05/12/2020. Cardiomyopathy and CHF, CAD, Aortic Valve                 Disease, Arrythmias:Atrial Fibrillation; Risk                 Factors:Hypertension and Current Smoker.  Sonographer:    Caesar Chestnut RDCS, RVT Referring Phys: [id number] [place]  1. Left ventricular ejection fraction, by estimation, is 45 to 50%. The left ventricle has mildly decreased function. The left ventricle demonstrates global hypokinesis. The left ventricular internal cavity size was mildly to moderately dilated. There is moderate left ventricular hypertrophy. Left ventricular diastolic parameters are consistent with Grade I diastolic dysfunction (impaired relaxation). Elevated left atrial pressure. The average left ventricular global longitudinal strain is -12.4 %. The global longitudinal strain is abnormal.  2. Right ventricular systolic function is normal. The right ventricular size is normal. There is normal pulmonary artery systolic pressure.  3. Left atrial size was mild to moderately dilated.  4. The mitral valve is abnormal. Mild mitral valve regurgitation. No evidence of mitral stenosis.  5. The aortic valve is tricuspid. There is  mild calcification of the aortic valve. There is mild thickening of the aortic valve. Aortic valve regurgitation is mild. Mild to moderate aortic valve sclerosis/calcification is present, without any evidence of aortic stenosis.  6. There is mild dilatation of the ascending aorta, measuring 39 mm.  7. The inferior vena cava is normal in size with greater than 50% respiratory variability, suggesting right atrial pressure of 3 mmHg. Comparison(s): LVEF  40-45%, Mod LVH. FINDINGS  Left Ventricle: Left ventricular ejection fraction, by estimation, is 45 to 50%. The left ventricle has mildly decreased function. The left ventricle demonstrates global hypokinesis. The average left ventricular global longitudinal strain is -12.4 %. The global longitudinal strain is abnormal. The left ventricular internal cavity size was mildly to moderately dilated. There is moderate left ventricular hypertrophy. Left ventricular diastolic parameters are consistent with Grade I diastolic dysfunction (impaired relaxation). Elevated left atrial pressure. Right Ventricle: The right ventricular size is normal. No increase in right ventricular wall thickness. Right ventricular systolic function is normal. There is normal pulmonary artery systolic pressure. The tricuspid regurgitant velocity is 2.27 m/s, and  with an assumed right atrial pressure of 3 mmHg, the estimated right ventricular systolic pressure is 16.1 mmHg. Left Atrium: Left atrial size was mild to moderately dilated. Right Atrium: Right atrial size was normal in size. Pericardium: There is no evidence of pericardial effusion. Mitral Valve: The mitral valve is abnormal. Mild mitral annular calcification. Mild mitral valve regurgitation. No evidence of mitral valve stenosis. Tricuspid Valve: The tricuspid valve is grossly normal. Tricuspid valve regurgitation is trivial. Aortic Valve: The aortic valve is tricuspid. There is mild calcification of the aortic valve. There is mild  thickening of the aortic valve. Aortic valve regurgitation is mild. Aortic regurgitation PHT measures 613 msec. Mild to moderate aortic valve sclerosis/calcification is present, without any evidence of aortic stenosis. Aortic valve mean gradient measures 6.0 mmHg. Aortic valve peak gradient measures 12.0 mmHg. Aortic valve area, by VTI measures 2.18 cm. Pulmonic Valve: The pulmonic valve was grossly normal. Pulmonic valve regurgitation is trivial. No evidence of pulmonic stenosis. Aorta: The aortic root is normal in size and structure. There is mild dilatation of the ascending aorta, measuring 39 mm. Pulmonary Artery: The pulmonary artery is of normal size. Venous: The inferior vena cava is normal in size with greater than 50% respiratory variability, suggesting right atrial pressure of 3 mmHg. IAS/Shunts: No atrial level shunt detected by color flow Doppler.  LEFT VENTRICLE PLAX 2D LVIDd:         6.10 cm      Diastology LVIDs:         4.60 cm      LV e' medial:    4.56 cm/s LV PW:         1.60 cm      LV E/e' medial:  16.3 LV IVS:        1.40 cm      LV e' lateral:   3.86 cm/s LVOT diam:     2.20 cm      LV E/e' lateral: 19.2 LV SV:         88 LV SV Index:   51           2D Longitudinal Strain LVOT Area:     3.80 cm     2D Strain GLS Avg:     -12.4 %  LV Volumes (MOD) LV vol d, MOD A2C: 153.0 ml LV vol d, MOD A4C: 148.0 ml LV vol s, MOD A2C: 87.0 ml LV vol s, MOD A4C: 82.3 ml LV SV MOD A2C:     66.0 ml LV SV MOD A4C:     148.0 ml LV SV MOD BP:      63.8 ml RIGHT VENTRICLE            IVC RV Basal diam:  3.40 cm    IVC diam: 2.00 cm RV S prime:  8.86 cm/s TAPSE (M-mode): 2.8 cm LEFT ATRIUM             Index LA Vol (A2C):   86.6 ml 49.85 ml/m LA Vol (A4C):   55.8 ml 32.09 ml/m LA Biplane Vol: 58.4 ml 33.61 ml/m  AORTIC VALVE                    PULMONIC VALVE AV Area (Vmax):    2.12 cm     PV Vmax:          0.73 m/s AV Area (Vmean):   1.98 cm     PV Peak grad:     2.1 mmHg AV Area (VTI):     2.18 cm     PR  End Diast Vel: 5.02 msec AV Vmax:           173.00 cm/s AV Vmean:          119.000 cm/s AV VTI:            0.402 m AV Peak Grad:      12.0 mmHg AV Mean Grad:      6.0 mmHg LVOT Vmax:         96.60 cm/s LVOT Vmean:        62.000 cm/s LVOT VTI:          0.231 m LVOT/AV VTI ratio: 0.57 AI PHT:            613 msec  AORTA Ao Root diam: 3.70 cm Ao Asc diam:  3.90 cm MITRAL VALVE                TRICUSPID VALVE MV Area (PHT): 2.62 cm     TR Peak grad:   20.6 mmHg MV Decel Time: 290 msec     TR Vmax:        227.00 cm/s MV E velocity: 74.20 cm/s MV A velocity: 114.00 cm/s  SHUNTS MV E/A ratio:  0.65         Systemic VTI:  0.23 m                             Systemic Diam: 2.20 cm [name] MD Electronically signed by [name] MD Signature Date/Time: 01/28/2021/7:36:21 AM    Final      Assessment/Plan DM (diabetes mellitus), type 2 with renal complications (HCC) blood glucose control important in reducing the progression of atherosclerotic disease. Also, involved in wound healing. On appropriate medications.     ESRD (end stage renal disease) ([place]) PD catheter placed about 2 years ago.   Hyperlipidemia lipid control important in reducing the progression of atherosclerotic disease. Continue statin therapy  Carotid stenosis Duplex today demonstrates velocities just into the 40 to 59% range in the right carotid artery and in the 1 to 39% range in the left carotid artery.  Both subclavian arteries have some moderately elevated velocities and he has a known previous history of left subclavian dissection.  The right velocities are actually higher than the left.  No role for intervention at these levels.  We can continue to monitor on an annual basis unless he develops symptoms.  Dissection of left subclavian artery (HCC) Duplex today demonstrates velocities just into the 40 to 59% range in the right carotid artery and in the 1 to 39% range in the left carotid artery.  Both subclavian arteries have some  moderately elevated velocities and he has a known previous  history of left subclavian dissection.  The right velocities are actually higher than the left.  No role for intervention at these levels.  We can continue to monitor on an annual basis unless he develops symptoms.    Leotis Pain, MD  02/17/2021 7:09 PM    This note was created with Dragon medical transcription system.  Any errors from dictation are purely unintentional

## 2021-02-18 DIAGNOSIS — D509 Iron deficiency anemia, unspecified: Secondary | ICD-10-CM | POA: Diagnosis not present

## 2021-02-18 DIAGNOSIS — N2581 Secondary hyperparathyroidism of renal origin: Secondary | ICD-10-CM | POA: Diagnosis not present

## 2021-02-18 DIAGNOSIS — N186 End stage renal disease: Secondary | ICD-10-CM | POA: Diagnosis not present

## 2021-02-18 DIAGNOSIS — D631 Anemia in chronic kidney disease: Secondary | ICD-10-CM | POA: Diagnosis not present

## 2021-02-18 DIAGNOSIS — Z992 Dependence on renal dialysis: Secondary | ICD-10-CM | POA: Diagnosis not present

## 2021-02-19 ENCOUNTER — Other Ambulatory Visit: Payer: Self-pay | Admitting: Nephrology

## 2021-02-19 DIAGNOSIS — Z992 Dependence on renal dialysis: Secondary | ICD-10-CM | POA: Diagnosis not present

## 2021-02-19 DIAGNOSIS — D509 Iron deficiency anemia, unspecified: Secondary | ICD-10-CM | POA: Diagnosis not present

## 2021-02-19 DIAGNOSIS — I739 Peripheral vascular disease, unspecified: Secondary | ICD-10-CM

## 2021-02-19 DIAGNOSIS — N2581 Secondary hyperparathyroidism of renal origin: Secondary | ICD-10-CM | POA: Diagnosis not present

## 2021-02-19 DIAGNOSIS — D631 Anemia in chronic kidney disease: Secondary | ICD-10-CM | POA: Diagnosis not present

## 2021-02-19 DIAGNOSIS — M160 Bilateral primary osteoarthritis of hip: Secondary | ICD-10-CM

## 2021-02-19 DIAGNOSIS — N186 End stage renal disease: Secondary | ICD-10-CM | POA: Diagnosis not present

## 2021-02-20 DIAGNOSIS — Z992 Dependence on renal dialysis: Secondary | ICD-10-CM | POA: Diagnosis not present

## 2021-02-20 DIAGNOSIS — N186 End stage renal disease: Secondary | ICD-10-CM | POA: Diagnosis not present

## 2021-02-20 DIAGNOSIS — D509 Iron deficiency anemia, unspecified: Secondary | ICD-10-CM | POA: Diagnosis not present

## 2021-02-20 DIAGNOSIS — D631 Anemia in chronic kidney disease: Secondary | ICD-10-CM | POA: Diagnosis not present

## 2021-02-20 DIAGNOSIS — N2581 Secondary hyperparathyroidism of renal origin: Secondary | ICD-10-CM | POA: Diagnosis not present

## 2021-02-21 DIAGNOSIS — N186 End stage renal disease: Secondary | ICD-10-CM | POA: Diagnosis not present

## 2021-02-21 DIAGNOSIS — Z992 Dependence on renal dialysis: Secondary | ICD-10-CM | POA: Diagnosis not present

## 2021-02-21 DIAGNOSIS — D509 Iron deficiency anemia, unspecified: Secondary | ICD-10-CM | POA: Diagnosis not present

## 2021-02-21 DIAGNOSIS — N2581 Secondary hyperparathyroidism of renal origin: Secondary | ICD-10-CM | POA: Diagnosis not present

## 2021-02-22 DIAGNOSIS — D509 Iron deficiency anemia, unspecified: Secondary | ICD-10-CM | POA: Diagnosis not present

## 2021-02-22 DIAGNOSIS — Z992 Dependence on renal dialysis: Secondary | ICD-10-CM | POA: Diagnosis not present

## 2021-02-22 DIAGNOSIS — N2581 Secondary hyperparathyroidism of renal origin: Secondary | ICD-10-CM | POA: Diagnosis not present

## 2021-02-22 DIAGNOSIS — N186 End stage renal disease: Secondary | ICD-10-CM | POA: Diagnosis not present

## 2021-02-23 DIAGNOSIS — N2581 Secondary hyperparathyroidism of renal origin: Secondary | ICD-10-CM | POA: Diagnosis not present

## 2021-02-23 DIAGNOSIS — N186 End stage renal disease: Secondary | ICD-10-CM | POA: Diagnosis not present

## 2021-02-23 DIAGNOSIS — D509 Iron deficiency anemia, unspecified: Secondary | ICD-10-CM | POA: Diagnosis not present

## 2021-02-23 DIAGNOSIS — Z992 Dependence on renal dialysis: Secondary | ICD-10-CM | POA: Diagnosis not present

## 2021-02-24 DIAGNOSIS — Z992 Dependence on renal dialysis: Secondary | ICD-10-CM | POA: Diagnosis not present

## 2021-02-24 DIAGNOSIS — N186 End stage renal disease: Secondary | ICD-10-CM | POA: Diagnosis not present

## 2021-02-24 DIAGNOSIS — N2581 Secondary hyperparathyroidism of renal origin: Secondary | ICD-10-CM | POA: Diagnosis not present

## 2021-02-24 DIAGNOSIS — D509 Iron deficiency anemia, unspecified: Secondary | ICD-10-CM | POA: Diagnosis not present

## 2021-02-25 DIAGNOSIS — N2581 Secondary hyperparathyroidism of renal origin: Secondary | ICD-10-CM | POA: Diagnosis not present

## 2021-02-25 DIAGNOSIS — D509 Iron deficiency anemia, unspecified: Secondary | ICD-10-CM | POA: Diagnosis not present

## 2021-02-25 DIAGNOSIS — N186 End stage renal disease: Secondary | ICD-10-CM | POA: Diagnosis not present

## 2021-02-25 DIAGNOSIS — Z992 Dependence on renal dialysis: Secondary | ICD-10-CM | POA: Diagnosis not present

## 2021-02-26 DIAGNOSIS — N186 End stage renal disease: Secondary | ICD-10-CM | POA: Diagnosis not present

## 2021-02-26 DIAGNOSIS — D509 Iron deficiency anemia, unspecified: Secondary | ICD-10-CM | POA: Diagnosis not present

## 2021-02-26 DIAGNOSIS — N2581 Secondary hyperparathyroidism of renal origin: Secondary | ICD-10-CM | POA: Diagnosis not present

## 2021-02-26 DIAGNOSIS — Z992 Dependence on renal dialysis: Secondary | ICD-10-CM | POA: Diagnosis not present

## 2021-02-27 ENCOUNTER — Encounter: Payer: Self-pay | Admitting: Internal Medicine

## 2021-02-27 ENCOUNTER — Other Ambulatory Visit: Payer: Self-pay

## 2021-02-27 ENCOUNTER — Ambulatory Visit (INDEPENDENT_AMBULATORY_CARE_PROVIDER_SITE_OTHER): Payer: Medicare Other | Admitting: Internal Medicine

## 2021-02-27 VITALS — BP 174/80 | HR 58 | Ht 67.0 in | Wt 153.0 lb

## 2021-02-27 DIAGNOSIS — I1A0 Resistant hypertension: Secondary | ICD-10-CM

## 2021-02-27 DIAGNOSIS — D649 Anemia, unspecified: Secondary | ICD-10-CM | POA: Diagnosis not present

## 2021-02-27 DIAGNOSIS — I480 Paroxysmal atrial fibrillation: Secondary | ICD-10-CM | POA: Diagnosis not present

## 2021-02-27 DIAGNOSIS — I5022 Chronic systolic (congestive) heart failure: Secondary | ICD-10-CM | POA: Diagnosis not present

## 2021-02-27 DIAGNOSIS — Z992 Dependence on renal dialysis: Secondary | ICD-10-CM

## 2021-02-27 DIAGNOSIS — I10 Essential (primary) hypertension: Secondary | ICD-10-CM

## 2021-02-27 DIAGNOSIS — N186 End stage renal disease: Secondary | ICD-10-CM

## 2021-02-27 DIAGNOSIS — D509 Iron deficiency anemia, unspecified: Secondary | ICD-10-CM | POA: Diagnosis not present

## 2021-02-27 DIAGNOSIS — I4891 Unspecified atrial fibrillation: Secondary | ICD-10-CM

## 2021-02-27 DIAGNOSIS — N2581 Secondary hyperparathyroidism of renal origin: Secondary | ICD-10-CM | POA: Diagnosis not present

## 2021-02-27 MED ORDER — BISOPROLOL FUMARATE 5 MG PO TABS: 2.5000 mg | ORAL_TABLET | Freq: Every day | ORAL | 1 refills | Status: DC

## 2021-02-27 NOTE — Patient Instructions (Signed)
Medication Instructions:   Your physician has recommended you make the following change in your medication:   DECREASE Bisoprolol 2.5 mg daily   *If you need a refill on your cardiac medications before your next appointment, please call your pharmacy*   Lab Work:  Today: CMET, CBC, TSH  If you have labs (blood work) drawn today and your tests are completely normal, you will receive your results only by: [place] (if you have MyChart) OR A paper copy in the mail If you have any lab test that is abnormal or we need to change your treatment, we will call you to review the results.   Testing/Procedures:  None ordered   Follow-Up: At [facility], you and your health needs are our priority.  As part of our continuing mission to provide you with exceptional heart care, we have created designated Provider Care Teams.  These Care Teams include your primary Cardiologist (physician) and Advanced Practice Providers (APPs -  Physician Assistants and Nurse Practitioners) who all work together to provide you with the care you need, when you need it.  We recommend signing up for the patient portal called "MyChart".  Sign up information is provided on this After Visit Summary.  MyChart is used to connect with patients for Virtual Visits (Telemedicine).  Patients are able to view lab/test results, encounter notes, upcoming appointments, etc.  Non-urgent messages can be sent to your provider as well.   To learn more about what you can do with MyChart, go to [email or web address].    Your next appointment:    1)  You have been referred to EP (Electrophysiology) with Dr. Quentin Ore  2)  3 month(s) with Dr. Saunders Revel  The format for your next appointment:   In Person  Provider:   You may see [name], MD

## 2021-02-27 NOTE — Progress Notes (Signed)
Follow-up Outpatient Visit Date: 02/27/2021  Primary Care Provider: Crecencio Mc, MD [address] [place] [address]  Chief Complaint: Fatigue  HPI:  Mr. Canupp is a 75 y.o. male with history of Franceen Erisman-stage renal disease on peritoneal dialysis, cardiomyopathy with an EF of 40-45%, prior abnormal stress test, hypertension, hyperlipidemia, peripheral arterial disease with chronic dissection of the left subclavian artery followed by vascular surgery, sleep apnea, renal cell carcinoma status post left heminephrectomy, tobacco abuse, stroke, and adenocarcinoma of the appendix, who presents for follow-up of uncontrolled hypertension and cardiomyopathy.  He was last seen in the office in August by Tarri Glenn, [place], after having been hospitalized with GI bleed (thought to be due to peritoneal dialysis catheter) in early June followed by newly diagnosed atrial fibrillation in the setting of COVID-19 infection in July.  Due to history of GI bleeding, anticoagulation was deferred.  He was in sinus rhythm at the time of his follow-up visit.  Follow-up echocardiogram last month showed LVEF of 45-50% with global hypokinesis, moderate LVH, grade 1 diastolic dysfunction, and mild mitral regurgitation.  Today, Mr. Sakata reports that he has been fatigued over the last few months, most pronounced since he was initially hospitalized in June with a GI bleed and then subsequently atrial fibrillation with rapid ventricular response in the setting of COVID-19 in July.  He has noticed a few episodes of fluttering in his chest over the last month.  He also notes that he sometimes becomes suddenly weak and generally fatigued.  This occurred yesterday while he was in the shower.  He was not having frank palpitations at the time but noted that his heart seemed to be beating faster than normal.  He took his usual blood pressure medications and felt like things gradually subsided over the course of about 3  hours.  These episodes of elevated heart rates and flutters are often accompanied by lightheadedness.  Mr. Cuccaro has not passed out.  He has not had any further rectal bleeding.  He has been compliant with his peritoneal dialysis regimen.  Mr. Mells denies chest pain but has felt short of breath at times, especially in the mornings when he first gets up.  He has had to sleep on 2 pillows on a few occasions due to orthopnea.  He denies significant leg edema.  His blood pressures remain labile but mostly elevated.  He notes that he ran out of his clonidine patch yesterday and is hoping to pick a new prescription up later today.  --------------------------------------------------------------------------------------------------  Past Medical History:  Diagnosis Date   Acute on chronic systolic congestive heart failure ([place])    Adenocarcinoma of appendix [facility]) Jan 2006   right kidney, s/p cryoablation   Atrial fibrillation with rapid ventricular response ([place]) 05/12/2020   Cardiomyopathy ([place])    a. 12/2018 Echo: EF 40-45%, global HK. Asc Ao 3.7cm; b. 01/2019 [facility] MV: small, mild, fixed basal and mid antlat defect - scar vs artifact. Small, mild mid and apical inf minimally reversible defect, likely scar w/ peri-infarct ischemia. Coronary and Ao atherosclerosis; c. 12/2019 Echo: EF 40-45%, glob HK, Gr1 DD. Nl RV fxn. Sev dil LA. Mild MR. Mild-mod Ao sclerosis w/o stenosis. Asc Ao 81mm.   Carotid arterial disease ([place])    a. 01/2020 RICA 1-39%, RCCA/RECA < 43%, LICA 3-29%, LCCA/LECA <50%.   Claudication ([place])    a. 02/2020 ABI/TBI: R 1.08/0.95, L 1.00/0.70.   Complication of anesthesia    had to be woken up  slowly as his bp was elevated when did this quickly   Diabetes mellitus without complication (HCC)    ESRD (Tynia Wiers stage renal disease) ([place])    a. Peritoneal Dialysis pt.   Hyperlipidemia    Hypertension    Migraine    cluster   Neuromuscular disorder ([place])    left lower extrem neuropathy    Obstructive sleep apnea    no OSA since had facial surgery with dr. Kathyrn Sheriff in 1997   PAD (peripheral artery disease) [facility]) Feb 2009   nonobstructing, renal angiogram [name]   Renal cell carcinoma 2004   left kidney heminephrectomy   Renal insufficiency    Sepsis ([place]) 11/06/2020   Stroke ([place])    a. 12/2019 MRI: Small acute infarcts involving the left cerebral hemisphere and several chronic infarcts.   TIA (transient ischemic attack)    no residual but left leg and foot still feel heavy   tobacco abuse    Past Surgical History:  Procedure Laterality Date   CAPD INSERTION N/A 03/01/2019   Procedure: LAPAROSCOPIC INSERTION CONTINUOUS AMBULATORY PERITONEAL DIALYSIS  (CAPD) CATHETER;  Surgeon: Algernon Huxley, MD;  Location: [facility] ORS;  Service: Vascular;  Laterality: N/A;   CARDIAC CATHETERIZATION     Dr. [name] did this to assess his renal artery   cyst removal  12/25/2015   Spine L4 and L5   heminephrectomy  2004   for renal cell CA   RENAL CRYOABLATION  Jan 2006   right kidney,  [name]   sciatica      Current Meds  Medication Sig   albuterol (VENTOLIN HFA) 108 (90 Base) MCG/ACT inhaler Inhale 2 puffs into the lungs every 6 (six) hours as needed for wheezing or shortness of breath.   amLODipine (NORVASC) 10 MG tablet Take 10 mg by mouth daily.   Ascorbic Acid 500 MG CHEW Chew 1 tablet by mouth daily.   b complex vitamins capsule Take 1 capsule by mouth daily.   bisoprolol (ZEBETA) 5 MG tablet Take 5 mg by mouth daily.   calcitRIOL (ROCALTROL) 0.25 MCG capsule Take 0.25 mcg by mouth every Monday, Wednesday, and Friday.   Cholecalciferol (VITAMIN D3 PO) Take 1 capsule by mouth daily.   cloNIDine (CATAPRES - DOSED IN MG/24 HR) 0.1 mg/24hr patch Place 0.1 mg onto the skin every Friday.   cloNIDine (CATAPRES) 0.2 MG tablet Take 0.2 mg by mouth at bedtime.   diphenhydrAMINE (BENADRYL) 25 MG tablet Take 25 mg by mouth every 6 (six) hours as needed for allergies.   doxazosin (CARDURA) 8 MG  tablet Take 8 mg by mouth daily.   fluticasone (FLONASE) 50 MCG/ACT nasal spray Place 2 sprays into both nostrils daily as needed for allergies or rhinitis.   gentamicin cream (GARAMYCIN) 0.1 % Apply 1 application topically daily.   irbesartan (AVAPRO) 300 MG tablet Take 1 tablet (300 mg total) by mouth daily.   isosorbide mononitrate (IMDUR) 30 MG 24 hr tablet Take 30 mg by mouth daily.   losartan (COZAAR) 100 MG tablet Take 100 mg by mouth daily.   multivitamin (RENA-VIT) TABS tablet Take 1 tablet by mouth at bedtime.   rosuvastatin (CRESTOR) 20 MG tablet TAKE 1 TABLET BY MOUTH AT BEDTIME   sevelamer (RENAGEL) 800 MG tablet Take 800 mg by mouth 3 (three) times daily with meals.   tiZANidine (ZANAFLEX) 2 MG tablet Take 1 tablet (2 mg total) by mouth every 12 (twelve) hours as needed for muscle spasms.   torsemide (DEMADEX) 100 MG  tablet Take 100 mg by mouth daily.    Allergies: Irbesartan, Cymbalta [duloxetine hcl], Hydralazine, Imdur [isosorbide nitrate], Atorvastatin, and Bystolic [nebivolol hcl]  Social History   Tobacco Use   Smoking status: Every Day    Packs/day: 1.00    Years: 50.00    Pack years: 50.00    Types: Cigarettes   Smokeless tobacco: Never  Vaping Use   Vaping Use: Former   Devices: tried but did not like  Substance Use Topics   Alcohol use: Not Currently   Drug use: No    Family History  Problem Relation Age of Onset   Hypertension Mother    Cancer Mother        breast   Aneurysm Mother    Coronary artery disease Father    Hypertension Father    Stroke Father 84   Heart disease Father    Heart attack Father 59   Aneurysm Maternal Grandmother        brain   Aneurysm Paternal Grandmother        brain   Coronary artery disease Paternal Grandfather    Heart disease Brother        valvular heart disease   COPD Brother    Hypertension Brother    Stroke Paternal Uncle     Review of Systems: A 12-system review of systems was performed and was  negative except as noted in the HPI.  --------------------------------------------------------------------------------------------------  Physical Exam: BP (!) 174/80 (BP Location: Left Arm, Patient Position: Sitting, Cuff Size: Normal)   Pulse (!) 58   Ht 5\' 7"  (1.702 m)   Wt 153 lb (69.4 kg)   SpO2 98%   BMI 23.96 kg/m   General:  NAD. Neck: No JVD or HJR. Lungs: Clear to auscultation bilaterally without wheezes or crackles. Heart: Regular rate and rhythm with 2/6 systolic murmur. Abdomen: Soft, nontender, nondistended. Extremities: Trace pretibial edema.  EKG: Sinus bradycardia with left axis deviation, LVH with abnormal repolarization, and poor R wave progression in V1 through V3.  No significant change from prior tracing on 01/02/2021.  Lab Results  Component Value Date   WBC 5.7 11/30/2020   HGB 7.9 (L) 11/30/2020   HCT 23.2 (L) 11/30/2020   MCV 91.0 11/30/2020   PLT 126 (L) 11/30/2020    Lab Results  Component Value Date   NA 140 11/30/2020   K 3.7 11/30/2020   CL 109 11/30/2020   CO2 24 11/30/2020   BUN 61 (H) 11/30/2020   CREATININE 7.31 (H) 11/30/2020   GLUCOSE 97 11/30/2020   ALT 15 11/30/2020    Lab Results  Component Value Date   CHOL 122 05/12/2020   HDL 25 (L) 05/12/2020   LDLCALC 61 05/12/2020   LDLDIRECT 134.0 05/11/2017   TRIG 180 (H) 05/12/2020   CHOLHDL 4.9 05/12/2020    --------------------------------------------------------------------------------------------------  ASSESSMENT AND PLAN: Chronic HFrEF: Mr. Beeney has stable NYHA class III symptoms and mild edema on examination today.  Given his overall fatigue and mild sinus bradycardia today, we have agreed to decrease bisoprolol to 2.5 mg daily.  We will defer other medication changes, continuing volume management with the assistance of nephrology's using torsemide and peritoneal dialysis.  Of note, recent echocardiogram showed slight interval improvement in left ventricular systolic  function, with an ejection fraction of 45-50%.  We will continue to defer ischemia evaluation in the setting of recent GI bleed.  I will recheck a CBC, TSH, and CMP today.  Paroxysmal atrial fibrillation: Atrial fibrillation identified  in July, at which time Mr. Sarver tested positive for COVID-19.  EKG today demonstrates sinus bradycardia, though he has experienced a few episodes of palpitations and elevated heart rates since July with associated weakness and lightheadedness.  Previous event monitor in 2021 did not show evidence of atrial fibrillation.  We could consider repeating an event monitor, though it may be more useful to consult electrophysiology to discuss need for further monitoring versus antiarrhythmic therapy.  We will defer adding anticoagulation at this time given recent GI bleed and significant anemia pending repeat CBC and EP assessment.  Mr. Reichenberger may be a candidate for watchman device, though given GI bleed while on clopidogrel, even this may be difficult.  Risk for infection in the setting of ESRD and peritoneal dialysis is also a concern.  We discussed restarting aspirin, given his history of stroke, which may be a reasonable first step if his hemoglobin has improved.  Resistant hypertension: Blood pressures remain poorly controlled despite multiple agents.  Given fatigue and bradycardia, we will decrease bisoprolol to 2.5 mg daily, as above.  I have encouraged Mr. Isenberg to pick up his clonidine patch prescription today, which hopefully will help his blood pressure somewhat.  Prior renal artery Doppler did not show evidence of renal artery stenosis.  In the future, he may be a candidate for renal denervation if this is ultimately approved for resistant hypertension.  Acute on chronic anemia: No further blood loss reported.  ESRD likely playing a role in chronic anemia.  We will recheck CBC today.  ESRD: Continue peritoneal dialysis with ongoing follow-up with  nephrology.  Follow-up: Return to clinic in 3 months.  [name], MD 02/27/2021 10:03 AM

## 2021-02-28 ENCOUNTER — Encounter: Payer: Self-pay | Admitting: Internal Medicine

## 2021-02-28 DIAGNOSIS — N186 End stage renal disease: Secondary | ICD-10-CM | POA: Diagnosis not present

## 2021-02-28 DIAGNOSIS — N2581 Secondary hyperparathyroidism of renal origin: Secondary | ICD-10-CM | POA: Diagnosis not present

## 2021-02-28 DIAGNOSIS — D509 Iron deficiency anemia, unspecified: Secondary | ICD-10-CM | POA: Diagnosis not present

## 2021-02-28 DIAGNOSIS — Z992 Dependence on renal dialysis: Secondary | ICD-10-CM | POA: Diagnosis not present

## 2021-02-28 LAB — COMPREHENSIVE METABOLIC PANEL
ALT: 14 IU/L (ref 0–44)
AST: 12 IU/L (ref 0–40)
Albumin/Globulin Ratio: 1.5 (ref 1.2–2.2)
Albumin: 3.4 g/dL — ABNORMAL LOW (ref 3.7–4.7)
Alkaline Phosphatase: 140 IU/L — ABNORMAL HIGH (ref 44–121)
BUN/Creatinine Ratio: 6 — ABNORMAL LOW (ref 10–24)
BUN: 42 mg/dL — ABNORMAL HIGH (ref 8–27)
Bilirubin Total: <0.2 mg/dL (ref 0.0–1.2)
CO2: 19 mmol/L — ABNORMAL LOW (ref 20–29)
Calcium: 8.7 mg/dL (ref 8.6–10.2)
Chloride: 107 mmol/L — ABNORMAL HIGH (ref 96–106)
Creatinine, Ser: 7.09 mg/dL — ABNORMAL HIGH (ref 0.76–1.27)
Globulin, Total: 2.2 g/dL (ref 1.5–4.5)
Glucose: 128 mg/dL — ABNORMAL HIGH (ref 70–99)
Potassium: 5.2 mmol/L (ref 3.5–5.2)
Sodium: 143 mmol/L (ref 134–144)
Total Protein: 5.6 g/dL — ABNORMAL LOW (ref 6.0–8.5)
eGFR: 7 mL/min/{1.73_m2} — ABNORMAL LOW (ref >59)

## 2021-02-28 LAB — CBC
Hematocrit: 29.9 % — ABNORMAL LOW (ref 37.5–51.0)
Hemoglobin: 10.4 g/dL — ABNORMAL LOW (ref 13.0–17.7)
MCH: 31.1 pg (ref 26.6–33.0)
MCHC: 34.8 g/dL (ref 31.5–35.7)
MCV: 90 fL (ref 79–97)
Platelets: 167 10*3/uL (ref 150–450)
RBC: 3.34 x10E6/uL — ABNORMAL LOW (ref 4.14–5.80)
RDW: 14.2 % (ref 11.6–15.4)
WBC: 8.8 10*3/uL (ref 3.4–10.8)

## 2021-02-28 LAB — TSH: TSH: 2.77 u[IU]/mL (ref 0.450–4.500)

## 2021-03-01 DIAGNOSIS — D509 Iron deficiency anemia, unspecified: Secondary | ICD-10-CM | POA: Diagnosis not present

## 2021-03-01 DIAGNOSIS — Z992 Dependence on renal dialysis: Secondary | ICD-10-CM | POA: Diagnosis not present

## 2021-03-01 DIAGNOSIS — N186 End stage renal disease: Secondary | ICD-10-CM | POA: Diagnosis not present

## 2021-03-01 DIAGNOSIS — N2581 Secondary hyperparathyroidism of renal origin: Secondary | ICD-10-CM | POA: Diagnosis not present

## 2021-03-02 ENCOUNTER — Telehealth: Payer: Self-pay | Admitting: *Deleted

## 2021-03-02 DIAGNOSIS — N186 End stage renal disease: Secondary | ICD-10-CM | POA: Diagnosis not present

## 2021-03-02 DIAGNOSIS — N2581 Secondary hyperparathyroidism of renal origin: Secondary | ICD-10-CM | POA: Diagnosis not present

## 2021-03-02 DIAGNOSIS — Z992 Dependence on renal dialysis: Secondary | ICD-10-CM | POA: Diagnosis not present

## 2021-03-02 DIAGNOSIS — E119 Type 2 diabetes mellitus without complications: Secondary | ICD-10-CM | POA: Diagnosis not present

## 2021-03-02 DIAGNOSIS — D509 Iron deficiency anemia, unspecified: Secondary | ICD-10-CM | POA: Diagnosis not present

## 2021-03-02 MED ORDER — ASPIRIN EC 81 MG PO TBEC: 81.0000 mg | DELAYED_RELEASE_TABLET | Freq: Every day | ORAL | Status: DC

## 2021-03-02 NOTE — Telephone Encounter (Signed)
Spoke to pt and notified of lab results and Dr. Darnelle Bos recc.  Pt voiced understanding.  He will start Aspirin 81 mg daily and follow up as scheduled.  Pt has no further questions.

## 2021-03-02 NOTE — Telephone Encounter (Signed)
-----   Message from [name], MD sent at 03/02/2021  7:18 AM EDT ----- Hemoglobin has improved.  Kidney function, liver function, and electrolytes stable except for mild elevation in alkaline phosphatase.  I recommend that [name] begin taking aspirin 81 mg daily.  We will follow-up as previously arranged.

## 2021-03-03 DIAGNOSIS — N186 End stage renal disease: Secondary | ICD-10-CM | POA: Diagnosis not present

## 2021-03-03 DIAGNOSIS — Z992 Dependence on renal dialysis: Secondary | ICD-10-CM | POA: Diagnosis not present

## 2021-03-03 DIAGNOSIS — N2581 Secondary hyperparathyroidism of renal origin: Secondary | ICD-10-CM | POA: Diagnosis not present

## 2021-03-03 DIAGNOSIS — D509 Iron deficiency anemia, unspecified: Secondary | ICD-10-CM | POA: Diagnosis not present

## 2021-03-04 DIAGNOSIS — N2581 Secondary hyperparathyroidism of renal origin: Secondary | ICD-10-CM | POA: Diagnosis not present

## 2021-03-04 DIAGNOSIS — Z992 Dependence on renal dialysis: Secondary | ICD-10-CM | POA: Diagnosis not present

## 2021-03-04 DIAGNOSIS — D509 Iron deficiency anemia, unspecified: Secondary | ICD-10-CM | POA: Diagnosis not present

## 2021-03-04 DIAGNOSIS — N186 End stage renal disease: Secondary | ICD-10-CM | POA: Diagnosis not present

## 2021-03-05 DIAGNOSIS — N2581 Secondary hyperparathyroidism of renal origin: Secondary | ICD-10-CM | POA: Diagnosis not present

## 2021-03-05 DIAGNOSIS — Z992 Dependence on renal dialysis: Secondary | ICD-10-CM | POA: Diagnosis not present

## 2021-03-05 DIAGNOSIS — D509 Iron deficiency anemia, unspecified: Secondary | ICD-10-CM | POA: Diagnosis not present

## 2021-03-05 DIAGNOSIS — N186 End stage renal disease: Secondary | ICD-10-CM | POA: Diagnosis not present

## 2021-03-06 DIAGNOSIS — D509 Iron deficiency anemia, unspecified: Secondary | ICD-10-CM | POA: Diagnosis not present

## 2021-03-06 DIAGNOSIS — Z992 Dependence on renal dialysis: Secondary | ICD-10-CM | POA: Diagnosis not present

## 2021-03-06 DIAGNOSIS — N186 End stage renal disease: Secondary | ICD-10-CM | POA: Diagnosis not present

## 2021-03-06 DIAGNOSIS — N2581 Secondary hyperparathyroidism of renal origin: Secondary | ICD-10-CM | POA: Diagnosis not present

## 2021-03-07 DIAGNOSIS — Z992 Dependence on renal dialysis: Secondary | ICD-10-CM | POA: Diagnosis not present

## 2021-03-07 DIAGNOSIS — N2581 Secondary hyperparathyroidism of renal origin: Secondary | ICD-10-CM | POA: Diagnosis not present

## 2021-03-07 DIAGNOSIS — D509 Iron deficiency anemia, unspecified: Secondary | ICD-10-CM | POA: Diagnosis not present

## 2021-03-07 DIAGNOSIS — N186 End stage renal disease: Secondary | ICD-10-CM | POA: Diagnosis not present

## 2021-03-08 DIAGNOSIS — Z992 Dependence on renal dialysis: Secondary | ICD-10-CM | POA: Diagnosis not present

## 2021-03-08 DIAGNOSIS — D509 Iron deficiency anemia, unspecified: Secondary | ICD-10-CM | POA: Diagnosis not present

## 2021-03-08 DIAGNOSIS — N186 End stage renal disease: Secondary | ICD-10-CM | POA: Diagnosis not present

## 2021-03-08 DIAGNOSIS — N2581 Secondary hyperparathyroidism of renal origin: Secondary | ICD-10-CM | POA: Diagnosis not present

## 2021-03-09 DIAGNOSIS — N186 End stage renal disease: Secondary | ICD-10-CM | POA: Diagnosis not present

## 2021-03-09 DIAGNOSIS — D509 Iron deficiency anemia, unspecified: Secondary | ICD-10-CM | POA: Diagnosis not present

## 2021-03-09 DIAGNOSIS — Z992 Dependence on renal dialysis: Secondary | ICD-10-CM | POA: Diagnosis not present

## 2021-03-09 DIAGNOSIS — N2581 Secondary hyperparathyroidism of renal origin: Secondary | ICD-10-CM | POA: Diagnosis not present

## 2021-03-10 DIAGNOSIS — D509 Iron deficiency anemia, unspecified: Secondary | ICD-10-CM | POA: Diagnosis not present

## 2021-03-10 DIAGNOSIS — N2581 Secondary hyperparathyroidism of renal origin: Secondary | ICD-10-CM | POA: Diagnosis not present

## 2021-03-10 DIAGNOSIS — Z992 Dependence on renal dialysis: Secondary | ICD-10-CM | POA: Diagnosis not present

## 2021-03-10 DIAGNOSIS — N186 End stage renal disease: Secondary | ICD-10-CM | POA: Diagnosis not present

## 2021-03-11 ENCOUNTER — Ambulatory Visit: Payer: Medicare Other

## 2021-03-11 DIAGNOSIS — N2581 Secondary hyperparathyroidism of renal origin: Secondary | ICD-10-CM | POA: Diagnosis not present

## 2021-03-11 DIAGNOSIS — D509 Iron deficiency anemia, unspecified: Secondary | ICD-10-CM | POA: Diagnosis not present

## 2021-03-11 DIAGNOSIS — N186 End stage renal disease: Secondary | ICD-10-CM | POA: Diagnosis not present

## 2021-03-11 DIAGNOSIS — Z992 Dependence on renal dialysis: Secondary | ICD-10-CM | POA: Diagnosis not present

## 2021-03-12 DIAGNOSIS — Z992 Dependence on renal dialysis: Secondary | ICD-10-CM | POA: Diagnosis not present

## 2021-03-12 DIAGNOSIS — N186 End stage renal disease: Secondary | ICD-10-CM | POA: Diagnosis not present

## 2021-03-12 DIAGNOSIS — N2581 Secondary hyperparathyroidism of renal origin: Secondary | ICD-10-CM | POA: Diagnosis not present

## 2021-03-12 DIAGNOSIS — D509 Iron deficiency anemia, unspecified: Secondary | ICD-10-CM | POA: Diagnosis not present

## 2021-03-13 DIAGNOSIS — N2581 Secondary hyperparathyroidism of renal origin: Secondary | ICD-10-CM | POA: Diagnosis not present

## 2021-03-13 DIAGNOSIS — Z992 Dependence on renal dialysis: Secondary | ICD-10-CM | POA: Diagnosis not present

## 2021-03-13 DIAGNOSIS — N186 End stage renal disease: Secondary | ICD-10-CM | POA: Diagnosis not present

## 2021-03-13 DIAGNOSIS — D509 Iron deficiency anemia, unspecified: Secondary | ICD-10-CM | POA: Diagnosis not present

## 2021-03-14 DIAGNOSIS — Z992 Dependence on renal dialysis: Secondary | ICD-10-CM | POA: Diagnosis not present

## 2021-03-14 DIAGNOSIS — N186 End stage renal disease: Secondary | ICD-10-CM | POA: Diagnosis not present

## 2021-03-14 DIAGNOSIS — N2581 Secondary hyperparathyroidism of renal origin: Secondary | ICD-10-CM | POA: Diagnosis not present

## 2021-03-14 DIAGNOSIS — D509 Iron deficiency anemia, unspecified: Secondary | ICD-10-CM | POA: Diagnosis not present

## 2021-03-15 DIAGNOSIS — D509 Iron deficiency anemia, unspecified: Secondary | ICD-10-CM | POA: Diagnosis not present

## 2021-03-15 DIAGNOSIS — N186 End stage renal disease: Secondary | ICD-10-CM | POA: Diagnosis not present

## 2021-03-15 DIAGNOSIS — Z992 Dependence on renal dialysis: Secondary | ICD-10-CM | POA: Diagnosis not present

## 2021-03-15 DIAGNOSIS — N2581 Secondary hyperparathyroidism of renal origin: Secondary | ICD-10-CM | POA: Diagnosis not present

## 2021-03-16 DIAGNOSIS — D509 Iron deficiency anemia, unspecified: Secondary | ICD-10-CM | POA: Diagnosis not present

## 2021-03-16 DIAGNOSIS — N186 End stage renal disease: Secondary | ICD-10-CM | POA: Diagnosis not present

## 2021-03-16 DIAGNOSIS — N2581 Secondary hyperparathyroidism of renal origin: Secondary | ICD-10-CM | POA: Diagnosis not present

## 2021-03-16 DIAGNOSIS — Z992 Dependence on renal dialysis: Secondary | ICD-10-CM | POA: Diagnosis not present

## 2021-03-17 DIAGNOSIS — Z992 Dependence on renal dialysis: Secondary | ICD-10-CM | POA: Diagnosis not present

## 2021-03-17 DIAGNOSIS — N186 End stage renal disease: Secondary | ICD-10-CM | POA: Diagnosis not present

## 2021-03-17 DIAGNOSIS — N2581 Secondary hyperparathyroidism of renal origin: Secondary | ICD-10-CM | POA: Diagnosis not present

## 2021-03-17 DIAGNOSIS — D509 Iron deficiency anemia, unspecified: Secondary | ICD-10-CM | POA: Diagnosis not present

## 2021-03-18 DIAGNOSIS — D509 Iron deficiency anemia, unspecified: Secondary | ICD-10-CM | POA: Diagnosis not present

## 2021-03-18 DIAGNOSIS — N2581 Secondary hyperparathyroidism of renal origin: Secondary | ICD-10-CM | POA: Diagnosis not present

## 2021-03-18 DIAGNOSIS — N186 End stage renal disease: Secondary | ICD-10-CM | POA: Diagnosis not present

## 2021-03-18 DIAGNOSIS — Z992 Dependence on renal dialysis: Secondary | ICD-10-CM | POA: Diagnosis not present

## 2021-03-19 DIAGNOSIS — N186 End stage renal disease: Secondary | ICD-10-CM | POA: Diagnosis not present

## 2021-03-19 DIAGNOSIS — Z992 Dependence on renal dialysis: Secondary | ICD-10-CM | POA: Diagnosis not present

## 2021-03-19 DIAGNOSIS — N2581 Secondary hyperparathyroidism of renal origin: Secondary | ICD-10-CM | POA: Diagnosis not present

## 2021-03-19 DIAGNOSIS — D509 Iron deficiency anemia, unspecified: Secondary | ICD-10-CM | POA: Diagnosis not present

## 2021-03-20 DIAGNOSIS — D509 Iron deficiency anemia, unspecified: Secondary | ICD-10-CM | POA: Diagnosis not present

## 2021-03-20 DIAGNOSIS — Z992 Dependence on renal dialysis: Secondary | ICD-10-CM | POA: Diagnosis not present

## 2021-03-20 DIAGNOSIS — N2581 Secondary hyperparathyroidism of renal origin: Secondary | ICD-10-CM | POA: Diagnosis not present

## 2021-03-20 DIAGNOSIS — N186 End stage renal disease: Secondary | ICD-10-CM | POA: Diagnosis not present

## 2021-03-21 ENCOUNTER — Other Ambulatory Visit: Payer: Self-pay | Admitting: Internal Medicine

## 2021-03-21 DIAGNOSIS — N2581 Secondary hyperparathyroidism of renal origin: Secondary | ICD-10-CM | POA: Diagnosis not present

## 2021-03-21 DIAGNOSIS — D509 Iron deficiency anemia, unspecified: Secondary | ICD-10-CM | POA: Diagnosis not present

## 2021-03-21 DIAGNOSIS — Z992 Dependence on renal dialysis: Secondary | ICD-10-CM | POA: Diagnosis not present

## 2021-03-21 DIAGNOSIS — N186 End stage renal disease: Secondary | ICD-10-CM | POA: Diagnosis not present

## 2021-03-22 DIAGNOSIS — D509 Iron deficiency anemia, unspecified: Secondary | ICD-10-CM | POA: Diagnosis not present

## 2021-03-22 DIAGNOSIS — N2581 Secondary hyperparathyroidism of renal origin: Secondary | ICD-10-CM | POA: Diagnosis not present

## 2021-03-22 DIAGNOSIS — Z992 Dependence on renal dialysis: Secondary | ICD-10-CM | POA: Diagnosis not present

## 2021-03-22 DIAGNOSIS — N186 End stage renal disease: Secondary | ICD-10-CM | POA: Diagnosis not present

## 2021-03-23 DIAGNOSIS — N186 End stage renal disease: Secondary | ICD-10-CM | POA: Diagnosis not present

## 2021-03-23 DIAGNOSIS — N2581 Secondary hyperparathyroidism of renal origin: Secondary | ICD-10-CM | POA: Diagnosis not present

## 2021-03-23 DIAGNOSIS — D509 Iron deficiency anemia, unspecified: Secondary | ICD-10-CM | POA: Diagnosis not present

## 2021-03-23 DIAGNOSIS — Z992 Dependence on renal dialysis: Secondary | ICD-10-CM | POA: Diagnosis not present

## 2021-03-24 DIAGNOSIS — Z992 Dependence on renal dialysis: Secondary | ICD-10-CM | POA: Diagnosis not present

## 2021-03-24 NOTE — Progress Notes (Signed)
Electrophysiology Office Note:    Date:  03/25/2021   ID:  [name], DOB 11/01/1945, MRN [id number]  PCP:  Brett Mc, MD  [facility] Cardiologist:  Brett Bush, MD  [facility] Electrophysiologist:  Brett Epley, MD   Referring MD: Brett Bush, MD   Chief Complaint: AF  History of Present Illness:    [name] is a 75 y.o. male who presents for an evaluation of AF at the request of [name]. Their medical history includes ESRD on PD, HFrEF, HTN, HLD, PAD, OSA, RCC s/p nephrectomy, tobacco abuse, stroke and adenocarcinoma of the appendix. He saw Brett [name] 02/27/2021. He previously had a GI bleed which led to his anticoaulation being held.  Since the GI bleed, his Plavix was held and he has done well without recurrence of bleeding.  He is interested in restarting his anticoagulant for stroke protection given history of stroke.  His blood pressures are typically in the 180s at home.  He is not currently taking the clonidine patch and takes the clonidine pill only once a day at nighttime.  Today he tells me that he can feel his heart rate increased 1-2 times per month.  He does think that these episodes have become more frequent.  They typically resolve once he lays down.  Aside from his elevated heart rate episodes he feels constant fatigue.  This is gradually worsening.  No chest pain.  No syncope.  He tells me in the past when he took clonidine tablets 3 times a day it made him feel so fatigued that he could barely function.  He did better with the clonidine patch but is currently not using the patch because of recommendation his pharmacist made.     Past Medical History:  Diagnosis Date   Acute on chronic systolic congestive heart failure [facility])    Adenocarcinoma of appendix [facility]) Jan 2006   right kidney, s/p cryoablation   Atrial fibrillation with rapid ventricular response ([place]) 05/12/2020   Cardiomyopathy ([place])    a. 12/2018 Echo: EF 40-45%, global HK.  Asc Ao 3.7cm; b. 01/2019 Lexi MV: small, mild, fixed basal and mid antlat defect - scar vs artifact. Small, mild mid and apical inf minimally reversible defect, likely scar w/ peri-infarct ischemia. Coronary and Ao atherosclerosis; c. 12/2019 Echo: EF 40-45%, glob HK, Gr1 DD. Nl RV fxn. Sev dil LA. Mild MR. Mild-mod Ao sclerosis w/o stenosis. Asc Ao 35mm.   Carotid arterial disease ([place])    a. 01/2020 RICA 1-39%, RCCA/RECA < 12%, LICA 4-58%, LCCA/LECA <50%.   Claudication ([place])    a. 02/2020 ABI/TBI: R 1.08/0.95, L 1.00/0.70.   Complication of anesthesia    had to be woken up slowly as his bp was elevated when did this quickly   Diabetes mellitus without complication (HCC)    ESRD (Torres stage renal disease) ([place])    a. Peritoneal Dialysis pt.   Hyperlipidemia    Hypertension    Migraine    cluster   Neuromuscular disorder ([place])    left lower extrem neuropathy   Obstructive sleep apnea    no OSA since had facial surgery with Brett. Kathyrn Torres in 1997   PAD (peripheral artery disease) [facility]) Feb 2009   nonobstructing, renal angiogram [name])   Renal cell carcinoma 2004   left kidney heminephrectomy   Renal insufficiency    Sepsis ([place]) 11/06/2020   Stroke ([place])    a. 12/2019 MRI: Small acute infarcts involving the left cerebral hemisphere  and several chronic infarcts.   TIA (transient ischemic attack)    no residual but left leg and foot still feel heavy   tobacco abuse     Past Surgical History:  Procedure Laterality Date   CAPD INSERTION N/A 03/01/2019   Procedure: LAPAROSCOPIC INSERTION CONTINUOUS AMBULATORY PERITONEAL DIALYSIS  (CAPD) CATHETER;  Surgeon: Brett Huxley, MD;  Location: [facility] ORS;  Service: Vascular;  Laterality: N/A;   CARDIAC CATHETERIZATION     Brett. Fletcher Torres did this to assess his renal artery   cyst removal  12/25/2015   Spine L4 and L5   heminephrectomy  2004   for renal cell CA   RENAL CRYOABLATION  Jan 2006   right kidney,  [name]   sciatica      Current  Medications: Current Meds  Medication Sig   amLODipine (NORVASC) 10 MG tablet TAKE 1 TABLET BY MOUTH ONCE DAILY   Ascorbic Acid 500 MG CHEW Chew 1 tablet by mouth daily.   aspirin EC 81 MG tablet Take 1 tablet (81 mg total) by mouth daily. Swallow whole.   b complex vitamins capsule Take 1 capsule by mouth daily.   bisoprolol (ZEBETA) 5 MG tablet Take 0.5 tablets (2.5 mg total) by mouth daily.   calcitRIOL (ROCALTROL) 0.25 MCG capsule Take 0.25 mcg by mouth every Monday, Wednesday, and Friday.   Cholecalciferol (VITAMIN D3 PO) Take 1 capsule by mouth daily.   diphenhydrAMINE (BENADRYL) 25 MG tablet Take 25 mg by mouth every 6 (six) hours as needed for allergies.   doxazosin (CARDURA) 8 MG tablet Take 8 mg by mouth daily.   fluticasone (FLONASE) 50 MCG/ACT nasal spray Place 2 sprays into both nostrils daily as needed for allergies or rhinitis.   gentamicin cream (GARAMYCIN) 0.1 % Apply 1 application topically daily.   irbesartan (AVAPRO) 300 MG tablet Take 1 tablet (300 mg total) by mouth daily.   isosorbide mononitrate (IMDUR) 30 MG 24 hr tablet Take 30 mg by mouth daily.   losartan (COZAAR) 100 MG tablet Take 100 mg by mouth daily.   multivitamin (RENA-VIT) TABS tablet Take 1 tablet by mouth at bedtime.   rosuvastatin (CRESTOR) 20 MG tablet TAKE 1 TABLET BY MOUTH AT BEDTIME   sevelamer (RENAGEL) 800 MG tablet Take 800 mg by mouth 3 (three) times daily with meals.   tiZANidine (ZANAFLEX) 2 MG tablet Take 1 tablet (2 mg total) by mouth every 12 (twelve) hours as needed for muscle spasms.   torsemide (DEMADEX) 100 MG tablet Take 100 mg by mouth daily.   [DISCONTINUED] albuterol (VENTOLIN HFA) 108 (90 Base) MCG/ACT inhaler Inhale 2 puffs into the lungs every 6 (six) hours as needed for wheezing or shortness of breath.   [DISCONTINUED] cloNIDine (CATAPRES - DOSED IN MG/24 HR) 0.1 mg/24hr patch Place 0.1 mg onto the skin every Friday.   [DISCONTINUED] cloNIDine (CATAPRES) 0.2 MG tablet Take 0.2 mg  by mouth at bedtime.     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 [place] Use   Smoking status: Every Day    Packs/day: 1.00    Years: 50.00    Pack years:  50.00    Types: Cigarettes   Smokeless tobacco: Never  Vaping Use   Vaping Use: Former   Devices: tried but did not like  Substance and Sexual Activity   Alcohol use: Not Currently   Drug use: No   Sexual activity: Yes  Other Topics Concern   Not on file  Social History Narrative   Not on file   Social Determinants of Health   Financial Resource Strain: Low Risk    Difficulty of Paying Living Expenses: Not hard at all  Food Insecurity: No Food Insecurity   Worried About Charity fundraiser in the Last Year: Never true   [place] in the Last Year: Never true  Transportation Needs: No Transportation Needs   Lack of Transportation (Medical): No   Lack of Transportation (Non-Medical): No  Physical Activity: Insufficiently Active   Days of Exercise per Week: 3 days   Minutes of Exercise per Session: 20 min  Stress: No Stress Concern Present   Feeling of Stress : Not at all  Social Connections: Not on file     Family History: The patient's family history includes Aneurysm in his maternal grandmother, mother, and paternal grandmother; COPD in his brother; Cancer in his mother; Coronary artery disease in his father and paternal grandfather; Heart attack (age of onset: 27) in his father; Heart disease in his brother and father; Hypertension in his brother, father, and mother; Stroke in his paternal uncle; Stroke (age of onset: 55) in his father.  ROS:    Please see the history of present illness.    All other systems reviewed and are negative.  EKGs/Labs/Other Studies Reviewed:    The following studies were reviewed today: Hospital records  EKG:  The ekg ordered today demonstrates sinus rhythm, left anterior fascicular block, LVH   Recent Labs: 08/06/2020: B Natriuretic Peptide 1,785.3 01/23/2021: Magnesium 2.4 02/27/2021: ALT 14; BUN 42; Creatinine, Ser 7.09; Hemoglobin 10.4; Platelets 167; Potassium 5.2; Sodium 143; TSH 2.770  Recent Lipid Panel    Component Value Date/Time   CHOL 122 05/12/2020 1238   CHOL 159 01/04/2012 0428   TRIG 180 (H) 05/12/2020 1238   TRIG 218 (H) 01/04/2012 0428   HDL 25 (L) 05/12/2020 1238   HDL 17 (L) 01/04/2012 0428   CHOLHDL 4.9 05/12/2020 1238   VLDL 36 05/12/2020 1238   VLDL 44 (H) 01/04/2012 0428   LDLCALC 61 05/12/2020 1238   LDLCALC 98 01/04/2012 0428   LDLDIRECT 134.0 05/11/2017 1450    Physical Exam:    VS:  BP (!) 200/74 (BP Location: Left Arm, Patient Position: Sitting, Cuff Size: Normal)   Pulse 66   Ht 5\' 7"  (1.702 m)   Wt 152 lb (68.9 kg)   SpO2 97%   BMI 23.81 kg/m     Wt Readings from Last 3 Encounters:  03/25/21 152 lb (68.9 kg)  02/27/21 153 lb (69.4 kg)  02/17/21 148 lb 9.6 oz (67.4 kg)     GEN: Chronically ill-appearing in no acute distress HEENT: Normal NECK: No JVD; No carotid bruits LYMPHATICS: No lymphadenopathy CARDIAC: RRR, no murmurs, rubs, gallops RESPIRATORY:  Clear to auscultation without rales, wheezing or rhonchi  ABDOMEN: Soft, non-tender, non-distended MUSCULOSKELETAL:  No edema; No deformity  SKIN: Warm and dry NEUROLOGIC:  Alert and oriented x 3 PSYCHIATRIC:  Normal affect       ASSESSMENT:    1. PAF (paroxysmal atrial fibrillation) ([place])   2. Chronic HFrEF (heart failure with reduced ejection  fraction) ([place])   3. ESRD on peritoneal dialysis ([place])   4. Uncontrolled hypertension    PLAN:    In order of problems listed  above:  #Paroxysmal atrial fibrillation I suspect he is having a very low burden of atrial fibrillation but I need to further clarify with a 2-week ZIO monitor.  This will help guide further medical therapy for his A. fib.  We discussed the stroke risk associate with his atrial fibrillation.  We are both uncomfortable with him not being on an anticoagulant despite his isolated GI bleed.  We discussed the pros and cons of continuing to hold anticoagulant versus rechallenge him with anticoagulation.  He referred to "lesser of 2 evils" and would like to rechallenge with oral anticoagulation which I think is a very reasonable choice.  We will use Eliquis.  #Hypertension Uncontrolled.  Not currently taking clonidine patch.  Has allergies that I confirmed hydralazine and Imdur.  I do not think that the clonidine by mouth once a day is the right choice for him.  I would like to restart the clonidine patch today.  I will have him stop the nightly clonidine tablet.  He will continue his other antihypertensive prescriptions.  #ESRD  Follow-up with me in 6 to 8 weeks.     Medication Adjustments/Labs and Tests Ordered: Current medicines are reviewed at length with the patient today.  Concerns regarding medicines are outlined above.  Orders Placed This Encounter  Procedures   EKG 12-Lead    Meds ordered this encounter  Medications   cloNIDine (CATAPRES - DOSED IN MG/24 HR) 0.1 mg/24hr patch    Sig: Place 1 patch (0.1 mg total) onto the skin every Friday.    Dispense:  12 patch    Refill:  3   albuterol (VENTOLIN HFA) 108 (90 Base) MCG/ACT inhaler    Sig: Inhale 2 puffs into the lungs every 6 (six) hours as needed for wheezing or shortness of breath.    Dispense:  8 g    Refill:  11      Signed, Anavictoria Wilk T. Quentin Ore, MD, [facility], [facility] 03/25/2021 11:22 AM    Electrophysiology [facility]

## 2021-03-25 ENCOUNTER — Ambulatory Visit (INDEPENDENT_AMBULATORY_CARE_PROVIDER_SITE_OTHER): Payer: Medicare Other

## 2021-03-25 ENCOUNTER — Encounter: Payer: Self-pay | Admitting: Cardiology

## 2021-03-25 ENCOUNTER — Other Ambulatory Visit: Payer: Self-pay

## 2021-03-25 ENCOUNTER — Ambulatory Visit (INDEPENDENT_AMBULATORY_CARE_PROVIDER_SITE_OTHER): Payer: Medicare Other | Admitting: Cardiology

## 2021-03-25 VITALS — BP 200/74 | HR 66 | Ht 67.0 in | Wt 152.0 lb

## 2021-03-25 DIAGNOSIS — I10 Essential (primary) hypertension: Secondary | ICD-10-CM

## 2021-03-25 DIAGNOSIS — I5022 Chronic systolic (congestive) heart failure: Secondary | ICD-10-CM

## 2021-03-25 DIAGNOSIS — I480 Paroxysmal atrial fibrillation: Secondary | ICD-10-CM | POA: Diagnosis not present

## 2021-03-25 DIAGNOSIS — N186 End stage renal disease: Secondary | ICD-10-CM | POA: Diagnosis not present

## 2021-03-25 DIAGNOSIS — Z992 Dependence on renal dialysis: Secondary | ICD-10-CM | POA: Diagnosis not present

## 2021-03-25 MED ORDER — ALBUTEROL SULFATE HFA 108 (90 BASE) MCG/ACT IN AERS: 2.0000 | INHALATION_SPRAY | Freq: Four times a day (QID) | RESPIRATORY_TRACT | 11 refills | Status: DC | PRN

## 2021-03-25 MED ORDER — CLONIDINE 0.1 MG/24HR TD PTWK: 0.1000 mg | MEDICATED_PATCH | TRANSDERMAL | 3 refills | Status: DC

## 2021-03-25 MED ORDER — APIXABAN 5 MG PO TABS: 5.0000 mg | ORAL_TABLET | Freq: Two times a day (BID) | ORAL | 11 refills | Status: DC

## 2021-03-25 NOTE — Patient Instructions (Addendum)
Medication Instructions:    STOP aspirin  2.  STOP taking clonidine TABLETS  3.  START wearing your clonidine patch-you will place this on your skin and change it once a week  4.   START taking Eliquis 5 mg-  Take one tablet by mouth TWICE a day  Lab Work: None ordered. If you have labs (blood work) drawn today and your tests are completely normal, you will receive your results only by: [place] (if you have MyChart) OR A paper copy in the mail If you have any lab test that is abnormal or we need to change your treatment, we will call you to review the results.  Testing/Procedures: Your physician has recommended that you wear a holter monitor. Holter monitors are medical devices that record the heart's electrical activity. Doctors most often use these monitors to diagnose arrhythmias. Arrhythmias are problems with the speed or rhythm of the heartbeat. The monitor is a small, portable device. You can wear one while you do your normal daily activities. This is usually used to diagnose what is causing palpitations/syncope (passing out).  You will wear a 14 day ZIO monitor  Follow-Up: At [facility], you and your health needs are our priority.  As part of our continuing mission to provide you with exceptional heart care, we have created designated Provider Care Teams.  These Care Teams include your primary Cardiologist (physician) and Advanced Practice Providers (APPs -  Physician Assistants and Nurse Practitioners) who all work together to provide you with the care you need, when you need it.  Your next appointment:   Your physician wants you to follow-up in: 6-8 weeks with Dr. Quentin Ore  Apixaban Tablets What is this medication? APIXABAN (a PIX a ban) prevents or treats blood clots. It is also used to lower the risk of stroke in people with AFib (atrial fibrillation). It belongs to a group of medications called blood thinners. This medicine may be used for other purposes; ask  your health care provider or pharmacist if you have questions. COMMON BRAND NAME(S): Eliquis What should I tell my care team before I take this medication? They need to know if you have any of these conditions: Antiphospholipid antibody syndrome Bleeding disorder History of bleeding in the brain History of blood clots History of stomach bleeding Kidney disease Liver disease Mechanical heart valve Spinal surgery An unusual or allergic reaction to apixaban, other medications, foods, dyes, or preservatives Pregnant or trying to get pregnant Breast-feeding How should I use this medication? Take this medication by mouth. For your therapy to work as well as possible, take each dose exactly as prescribed on the prescription label. Do not skip doses. Skipping doses or stopping this medication can increase your risk of a blood clot or stroke. Keep taking this medication unless your care team tells you to stop. Take it as directed on the prescription label at the same time every day. You can take it with or without food. If it upsets your stomach, take it with food. A special MedGuide will be given to you by the pharmacist with each prescription and refill. Be sure to read this information carefully each time. Talk to your care team about the use of this medication in children. Special care may be needed. Overdosage: If you think you have taken too much of this medicine contact a poison control center or emergency room at once. NOTE: This medicine is only for you. Do not share this medicine with others. What if I miss  a dose? If you miss a dose, take it as soon as you can. If it is almost time for your next dose, take only that dose. Do not take double or extra doses. What may interact with this medication? This medication may interact with the following: Aspirin and aspirin-like medications Certain medications for fungal infections like itraconazole and ketoconazole Certain medications for  seizures like carbamazepine and phenytoin Certain medications for blood clots like enoxaparin, dalteparin, heparin, and warfarin Clarithromycin NSAIDs, medications for pain and inflammation, like ibuprofen or naproxen Rifampin Ritonavir St. John's wort This list may not describe all possible interactions. Give your health care provider a list of all the medicines, herbs, non-prescription drugs, or dietary supplements you use. Also tell them if you smoke, drink alcohol, or use illegal drugs. Some items may interact with your medicine. What should I watch for while using this medication? Visit your healthcare professional for regular checks on your progress. You may need blood work done while you are taking this medication. Your condition will be monitored carefully while you are receiving this medication. It is important not to miss any appointments. Avoid sports and activities that might cause injury while you are using this medication. Severe falls or injuries can cause unseen bleeding. Be careful when using sharp tools or knives. Consider using an Copy. Take special care brushing or flossing your teeth. Report any injuries, bruising, or red spots on the skin to your healthcare professional. If you are going to need surgery or other procedure, tell your healthcare professional that you are taking this medication. Wear a medical ID bracelet or chain. Carry a card that describes your disease and details of your medication and dosage times. What side effects may I notice from receiving this medication? Side effects that you should report to your care team as soon as possible: Allergic reactions-skin rash, itching, hives, swelling of the face, lips, tongue, or throat Bleeding-bloody or black, tar-like stools, vomiting blood or brown material that looks like coffee grounds, red or dark brown urine, small red or purple spots on the skin, unusual bruising or bleeding Bleeding in the brain-severe  headache, stiff neck, confusion, dizziness, change in vision, numbness or weakness of the face, arm, or leg, trouble speaking, trouble walking, vomiting Heavy periods This list may not describe all possible side effects. Call your doctor for medical advice about side effects. You may report side effects to FDA at 1-800-FDA-1088. Where should I keep my medication? Keep out of the reach of children and pets. Store at room temperature between 20 and 25 degrees C (68 and 77 degrees F). Get rid of any unused medication after the expiration date. To get rid of medications that are no longer needed or expired: Take the medication to a medication take-back program. Check with your pharmacy or law enforcement to find a location. If you cannot return the medication, check the label or package insert to see if the medication should be thrown out in the garbage or flushed down the toilet. If you are not sure, ask your care team. If it is safe to put in the trash, empty the medication out of the container. Mix the medication with cat litter, dirt, coffee grounds, or other unwanted substance. Seal the mixture in a bag or container. Put it in the trash. NOTE: This sheet is a summary. It may not cover all possible information. If you have questions about this medicine, talk to your doctor, pharmacist, or health care provider.  2022 Elsevier/Gold  Standard (2020-06-06 12:37:23)

## 2021-04-01 ENCOUNTER — Other Ambulatory Visit: Payer: Self-pay

## 2021-04-01 ENCOUNTER — Emergency Department: Payer: Medicare Other

## 2021-04-01 ENCOUNTER — Inpatient Hospital Stay
Admission: EM | Admit: 2021-04-01 | Discharge: 2021-04-04 | DRG: 291 | Disposition: A | Discharge status: Home / Self Care | Payer: Medicare Other | Attending: Internal Medicine | Admitting: Internal Medicine

## 2021-04-01 DIAGNOSIS — I161 Hypertensive emergency: Secondary | ICD-10-CM

## 2021-04-01 DIAGNOSIS — J449 Chronic obstructive pulmonary disease, unspecified: Secondary | ICD-10-CM | POA: Diagnosis present

## 2021-04-01 DIAGNOSIS — R9431 Abnormal electrocardiogram [ECG] [EKG]: Secondary | ICD-10-CM | POA: Diagnosis not present

## 2021-04-01 DIAGNOSIS — J9811 Atelectasis: Secondary | ICD-10-CM | POA: Diagnosis present

## 2021-04-01 DIAGNOSIS — M898X9 Other specified disorders of bone, unspecified site: Secondary | ICD-10-CM | POA: Diagnosis present

## 2021-04-01 DIAGNOSIS — J811 Chronic pulmonary edema: Secondary | ICD-10-CM | POA: Diagnosis not present

## 2021-04-01 DIAGNOSIS — J810 Acute pulmonary edema: Secondary | ICD-10-CM

## 2021-04-01 DIAGNOSIS — I160 Hypertensive urgency: Secondary | ICD-10-CM | POA: Diagnosis not present

## 2021-04-01 DIAGNOSIS — E1122 Type 2 diabetes mellitus with diabetic chronic kidney disease: Secondary | ICD-10-CM | POA: Diagnosis present

## 2021-04-01 DIAGNOSIS — I429 Cardiomyopathy, unspecified: Secondary | ICD-10-CM | POA: Diagnosis present

## 2021-04-01 DIAGNOSIS — I480 Paroxysmal atrial fibrillation: Secondary | ICD-10-CM | POA: Diagnosis present

## 2021-04-01 DIAGNOSIS — Z79899 Other long term (current) drug therapy: Secondary | ICD-10-CM | POA: Diagnosis not present

## 2021-04-01 DIAGNOSIS — I4510 Unspecified right bundle-branch block: Secondary | ICD-10-CM | POA: Diagnosis present

## 2021-04-01 DIAGNOSIS — Z7901 Long term (current) use of anticoagulants: Secondary | ICD-10-CM

## 2021-04-01 DIAGNOSIS — Z20822 Contact with and (suspected) exposure to covid-19: Secondary | ICD-10-CM | POA: Diagnosis present

## 2021-04-01 DIAGNOSIS — F17210 Nicotine dependence, cigarettes, uncomplicated: Secondary | ICD-10-CM | POA: Diagnosis present

## 2021-04-01 DIAGNOSIS — N186 End stage renal disease: Secondary | ICD-10-CM | POA: Diagnosis not present

## 2021-04-01 DIAGNOSIS — Z8673 Personal history of transient ischemic attack (TIA), and cerebral infarction without residual deficits: Secondary | ICD-10-CM

## 2021-04-01 DIAGNOSIS — I10 Essential (primary) hypertension: Secondary | ICD-10-CM | POA: Diagnosis not present

## 2021-04-01 DIAGNOSIS — R0602 Shortness of breath: Secondary | ICD-10-CM | POA: Diagnosis not present

## 2021-04-01 DIAGNOSIS — N2581 Secondary hyperparathyroidism of renal origin: Secondary | ICD-10-CM | POA: Diagnosis not present

## 2021-04-01 DIAGNOSIS — J9601 Acute respiratory failure with hypoxia: Secondary | ICD-10-CM | POA: Diagnosis present

## 2021-04-01 DIAGNOSIS — E785 Hyperlipidemia, unspecified: Secondary | ICD-10-CM | POA: Diagnosis present

## 2021-04-01 DIAGNOSIS — Z809 Family history of malignant neoplasm, unspecified: Secondary | ICD-10-CM

## 2021-04-01 DIAGNOSIS — R0902 Hypoxemia: Secondary | ICD-10-CM

## 2021-04-01 DIAGNOSIS — I2583 Coronary atherosclerosis due to lipid rich plaque: Secondary | ICD-10-CM | POA: Diagnosis not present

## 2021-04-01 DIAGNOSIS — I5043 Acute on chronic combined systolic (congestive) and diastolic (congestive) heart failure: Secondary | ICD-10-CM | POA: Diagnosis not present

## 2021-04-01 DIAGNOSIS — I25118 Atherosclerotic heart disease of native coronary artery with other forms of angina pectoris: Secondary | ICD-10-CM | POA: Diagnosis not present

## 2021-04-01 DIAGNOSIS — Z85038 Personal history of other malignant neoplasm of large intestine: Secondary | ICD-10-CM

## 2021-04-01 DIAGNOSIS — Z85528 Personal history of other malignant neoplasm of kidney: Secondary | ICD-10-CM | POA: Diagnosis not present

## 2021-04-01 DIAGNOSIS — Z888 Allergy status to other drugs, medicaments and biological substances status: Secondary | ICD-10-CM

## 2021-04-01 DIAGNOSIS — Z823 Family history of stroke: Secondary | ICD-10-CM

## 2021-04-01 DIAGNOSIS — I132 Hypertensive heart and chronic kidney disease with heart failure and with stage 5 chronic kidney disease, or end stage renal disease: Principal | ICD-10-CM | POA: Diagnosis present

## 2021-04-01 DIAGNOSIS — Z825 Family history of asthma and other chronic lower respiratory diseases: Secondary | ICD-10-CM

## 2021-04-01 DIAGNOSIS — Z8249 Family history of ischemic heart disease and other diseases of the circulatory system: Secondary | ICD-10-CM | POA: Diagnosis not present

## 2021-04-01 DIAGNOSIS — I2510 Atherosclerotic heart disease of native coronary artery without angina pectoris: Secondary | ICD-10-CM | POA: Diagnosis not present

## 2021-04-01 DIAGNOSIS — Z992 Dependence on renal dialysis: Secondary | ICD-10-CM | POA: Diagnosis not present

## 2021-04-01 DIAGNOSIS — E7800 Pure hypercholesterolemia, unspecified: Secondary | ICD-10-CM | POA: Diagnosis not present

## 2021-04-01 DIAGNOSIS — D631 Anemia in chronic kidney disease: Secondary | ICD-10-CM | POA: Diagnosis present

## 2021-04-01 DIAGNOSIS — J90 Pleural effusion, not elsewhere classified: Secondary | ICD-10-CM | POA: Diagnosis not present

## 2021-04-01 DIAGNOSIS — I1311 Hypertensive heart and chronic kidney disease without heart failure, with stage 5 chronic kidney disease, or end stage renal disease: Secondary | ICD-10-CM | POA: Diagnosis not present

## 2021-04-01 DIAGNOSIS — G4733 Obstructive sleep apnea (adult) (pediatric): Secondary | ICD-10-CM | POA: Diagnosis present

## 2021-04-01 LAB — CBC
HCT: 32.5 % — ABNORMAL LOW (ref 39.0–52.0)
Hemoglobin: 10.7 g/dL — ABNORMAL LOW (ref 13.0–17.0)
MCH: 30.1 pg (ref 26.0–34.0)
MCHC: 32.9 g/dL (ref 30.0–36.0)
MCV: 91.3 fL (ref 80.0–100.0)
Platelets: 149 10*3/uL — ABNORMAL LOW (ref 150–400)
RBC: 3.56 MIL/uL — ABNORMAL LOW (ref 4.22–5.81)
RDW: 14.5 % (ref 11.5–15.5)
WBC: 10.5 10*3/uL (ref 4.0–10.5)
nRBC: 0 % (ref 0.0–0.2)

## 2021-04-01 LAB — BASIC METABOLIC PANEL
Anion gap: 10 (ref 5–15)
BUN: 58 mg/dL — ABNORMAL HIGH (ref 8–23)
CO2: 23 mmol/L (ref 22–32)
Calcium: 9 mg/dL (ref 8.9–10.3)
Chloride: 110 mmol/L (ref 98–111)
Creatinine, Ser: 7.51 mg/dL — ABNORMAL HIGH (ref 0.61–1.24)
GFR, Estimated: 7 mL/min — ABNORMAL LOW (ref >60)
Glucose, Bld: 141 mg/dL — ABNORMAL HIGH (ref 70–99)
Potassium: 4.4 mmol/L (ref 3.5–5.1)
Sodium: 143 mmol/L (ref 135–145)

## 2021-04-01 LAB — RESP PANEL BY RT-PCR (FLU A&B, COVID) ARPGX2
Influenza A by PCR: NEGATIVE
Influenza B by PCR: NEGATIVE
SARS Coronavirus 2 by RT PCR: NEGATIVE

## 2021-04-01 LAB — TROPONIN I (HIGH SENSITIVITY)
Troponin I (High Sensitivity): 20 ng/L — ABNORMAL HIGH (ref <18)
Troponin I (High Sensitivity): 20 ng/L — ABNORMAL HIGH (ref <18)

## 2021-04-01 MED ORDER — AMLODIPINE BESYLATE 10 MG PO TABS
10.0000 mg | ORAL_TABLET | Freq: Every day | ORAL | Status: DC
  Filled 2021-04-01: qty 1

## 2021-04-01 MED ORDER — ACETAMINOPHEN 650 MG RE SUPP
650.0000 mg | Freq: Four times a day (QID) | RECTAL | Status: DC | PRN
  Filled 2021-04-01: qty 1

## 2021-04-01 MED ORDER — FLUTICASONE PROPIONATE 50 MCG/ACT NA SUSP
2.0000 | Freq: Every day | NASAL | Status: DC | PRN
  Filled 2021-04-01: qty 16

## 2021-04-01 MED ORDER — HEPARIN 1000 UNIT/ML FOR PERITONEAL DIALYSIS
500.0000 [IU] | INTRAMUSCULAR | Status: DC | PRN
  Filled 2021-04-01: qty 0.5

## 2021-04-01 MED ORDER — RENA-VITE PO TABS
1.0000 | ORAL_TABLET | Freq: Every day | ORAL | Status: DC
  Administered 2021-04-01 – 2021-04-03 (×3): 1 via ORAL
  Filled 2021-04-01 (×4): qty 1

## 2021-04-01 MED ORDER — FUROSEMIDE 10 MG/ML IJ SOLN
40.0000 mg | Freq: Once | INTRAMUSCULAR | Status: AC
  Administered 2021-04-01: 40 mg via INTRAVENOUS
  Filled 2021-04-01: qty 4

## 2021-04-01 MED ORDER — ACETAMINOPHEN 325 MG PO TABS: 650.0000 mg | ORAL_TABLET | Freq: Four times a day (QID) | ORAL | Status: DC | PRN

## 2021-04-01 MED ORDER — METHYLPREDNISOLONE SODIUM SUCC 125 MG IJ SOLR
125.0000 mg | Freq: Once | INTRAMUSCULAR | Status: AC
  Administered 2021-04-01: 125 mg via INTRAVENOUS
  Filled 2021-04-01: qty 2

## 2021-04-01 MED ORDER — SEVELAMER CARBONATE 800 MG PO TABS
800.0000 mg | ORAL_TABLET | Freq: Three times a day (TID) | ORAL | Status: DC
  Administered 2021-04-01 – 2021-04-04 (×9): 800 mg via ORAL
  Filled 2021-04-01 (×9): qty 1

## 2021-04-01 MED ORDER — CALCITRIOL 0.25 MCG PO CAPS
0.2500 ug | ORAL_CAPSULE | ORAL | Status: DC
  Administered 2021-04-01 – 2021-04-03 (×2): 0.25 ug via ORAL
  Filled 2021-04-01 (×2): qty 1

## 2021-04-01 MED ORDER — HYDRALAZINE HCL 20 MG/ML IJ SOLN
10.0000 mg | Freq: Four times a day (QID) | INTRAMUSCULAR | Status: DC | PRN
  Administered 2021-04-01: 10 mg via INTRAVENOUS
  Filled 2021-04-01: qty 1

## 2021-04-01 MED ORDER — METOPROLOL TARTRATE 5 MG/5ML IV SOLN
5.0000 mg | Freq: Once | INTRAVENOUS | Status: AC
  Administered 2021-04-01: 5 mg via INTRAVENOUS
  Filled 2021-04-01: qty 5

## 2021-04-01 MED ORDER — IRBESARTAN 150 MG PO TABS: 300.0000 mg | ORAL_TABLET | Freq: Every day | ORAL | Status: DC

## 2021-04-01 MED ORDER — SODIUM CHLORIDE 0.9% FLUSH: 3.0000 mL | INTRAVENOUS | Status: DC | PRN

## 2021-04-01 MED ORDER — SODIUM CHLORIDE 0.9% FLUSH
3.0000 mL | Freq: Two times a day (BID) | INTRAVENOUS | Status: DC
  Administered 2021-04-01 – 2021-04-04 (×7): 3 mL via INTRAVENOUS

## 2021-04-01 MED ORDER — ISOSORBIDE MONONITRATE ER 30 MG PO TB24
30.0000 mg | ORAL_TABLET | Freq: Every day | ORAL | Status: DC
  Administered 2021-04-01 – 2021-04-02 (×2): 30 mg via ORAL
  Filled 2021-04-01 (×2): qty 1

## 2021-04-01 MED ORDER — DILTIAZEM HCL-DEXTROSE 125-5 MG/125ML-% IV SOLN (PREMIX)
5.0000 mg/h | INTRAVENOUS | Status: DC
  Administered 2021-04-01: 5 mg/h via INTRAVENOUS
  Administered 2021-04-01: 15 mg/h via INTRAVENOUS
  Administered 2021-04-01: 10 mg/h via INTRAVENOUS
  Filled 2021-04-01 (×2): qty 125

## 2021-04-01 MED ORDER — DILTIAZEM LOAD VIA INFUSION
10.0000 mg | Freq: Once | INTRAVENOUS | Status: AC
  Administered 2021-04-01: 10 mg via INTRAVENOUS
  Filled 2021-04-01: qty 10

## 2021-04-01 MED ORDER — IPRATROPIUM-ALBUTEROL 0.5-2.5 (3) MG/3ML IN SOLN
3.0000 mL | Freq: Once | RESPIRATORY_TRACT | Status: AC
  Administered 2021-04-01: 3 mL via RESPIRATORY_TRACT
  Filled 2021-04-01: qty 3

## 2021-04-01 MED ORDER — GENTAMICIN SULFATE 0.1 % EX CREA
1.0000 "application " | TOPICAL_CREAM | Freq: Every day | CUTANEOUS | Status: DC
  Filled 2021-04-01 (×3): qty 15

## 2021-04-01 MED ORDER — DELFLEX-LC/1.5% DEXTROSE 344 MOSM/L IP SOLN
INTRAPERITONEAL | Status: DC
  Administered 2021-04-01: 1 mL via INTRAPERITONEAL
  Filled 2021-04-01 (×4): qty 3000

## 2021-04-01 MED ORDER — TIZANIDINE HCL 2 MG PO TABS
2.0000 mg | ORAL_TABLET | Freq: Two times a day (BID) | ORAL | Status: DC | PRN
  Filled 2021-04-01: qty 1

## 2021-04-01 MED ORDER — NICOTINE 21 MG/24HR TD PT24
21.0000 mg | MEDICATED_PATCH | Freq: Every day | TRANSDERMAL | Status: DC
  Administered 2021-04-02 – 2021-04-04 (×3): 21 mg via TRANSDERMAL
  Filled 2021-04-01 (×3): qty 1

## 2021-04-01 MED ORDER — FUROSEMIDE 10 MG/ML IJ SOLN
80.0000 mg | Freq: Two times a day (BID) | INTRAMUSCULAR | Status: DC
  Administered 2021-04-01 – 2021-04-02 (×2): 80 mg via INTRAVENOUS
  Filled 2021-04-01 (×2): qty 8

## 2021-04-01 MED ORDER — BISOPROLOL FUMARATE 5 MG PO TABS
2.5000 mg | ORAL_TABLET | Freq: Every day | ORAL | Status: DC
  Administered 2021-04-02: 2.5 mg via ORAL
  Filled 2021-04-01: qty 0.5

## 2021-04-01 MED ORDER — ROSUVASTATIN CALCIUM 10 MG PO TABS
20.0000 mg | ORAL_TABLET | Freq: Every day | ORAL | Status: DC
  Administered 2021-04-01 – 2021-04-03 (×3): 20 mg via ORAL
  Filled 2021-04-01 (×3): qty 2
  Filled 2021-04-01: qty 1

## 2021-04-01 MED ORDER — ONDANSETRON HCL 4 MG/2ML IJ SOLN: 4.0000 mg | Freq: Four times a day (QID) | INTRAMUSCULAR | Status: DC | PRN

## 2021-04-01 MED ORDER — SODIUM CHLORIDE 0.9 % IV SOLN: 250.0000 mL | INTRAVENOUS | Status: DC | PRN

## 2021-04-01 MED ORDER — ONDANSETRON HCL 4 MG PO TABS: 4.0000 mg | ORAL_TABLET | Freq: Four times a day (QID) | ORAL | Status: DC | PRN

## 2021-04-01 MED ORDER — CLONIDINE HCL 0.1 MG/24HR TD PTWK
0.1000 mg | MEDICATED_PATCH | TRANSDERMAL | Status: DC
  Filled 2021-04-01: qty 1

## 2021-04-01 MED ORDER — APIXABAN 5 MG PO TABS
5.0000 mg | ORAL_TABLET | Freq: Two times a day (BID) | ORAL | Status: DC
  Administered 2021-04-01 – 2021-04-04 (×6): 5 mg via ORAL
  Filled 2021-04-01 (×6): qty 1

## 2021-04-01 MED ORDER — DOXAZOSIN MESYLATE 4 MG PO TABS
8.0000 mg | ORAL_TABLET | Freq: Every day | ORAL | Status: DC
  Administered 2021-04-02 – 2021-04-04 (×3): 8 mg via ORAL
  Filled 2021-04-01 (×2): qty 2
  Filled 2021-04-01 (×2): qty 1
  Filled 2021-04-01: qty 2
  Filled 2021-04-01 (×2): qty 1

## 2021-04-01 NOTE — Plan of Care (Signed)

## 2021-04-01 NOTE — ED Provider Notes (Signed)
[facility] Emergency Department Provider Note   ____________________________________________   I have reviewed the triage vital signs and the nursing notes.   HISTORY  Chief Complaint Shortness of Breath   History limited by: Not Limited   HPI [name] is a 75 y.o. male who presents to the emergency department today because of concerns for shortness of breath.  Patient states that has been present over the past few days.  He notices it most when he tries to lie down.  He denies any associated chest pain.  He states he does take peritoneal dialysis but has not had any change in his dialysis recently.  Denies any fevers or chills.   Records reviewed. Per medical record review patient has a history of ESRD on PD.  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 12/2018 Echo: EF 40-45%, global HK. Asc Ao 3.7cm; b. 01/2019 Lexi MV: small, mild, fixed basal and mid antlat defect - scar vs artifact. Small, mild mid and apical inf minimally reversible defect, likely scar w/ peri-infarct ischemia. Coronary and Ao atherosclerosis; c. 12/2019 Echo: EF 40-45%, glob HK, Gr1 DD. Nl RV fxn. Sev dil LA. Mild MR. Mild-mod Ao sclerosis w/o stenosis. Asc Ao 42mm.   Carotid arterial disease ([place])    a. 01/2020 RICA 1-39%, RCCA/RECA < 79%, LICA 0-24%, LCCA/LECA <50%.   Claudication ([place])    a. 02/2020 ABI/TBI: R 1.08/0.95, L 1.00/0.70.   Complication of anesthesia    had to be woken up slowly as his bp was elevated when did this quickly   Diabetes mellitus without complication (HCC)    ESRD (end stage renal disease) ([place])    a. Peritoneal Dialysis pt.   Hyperlipidemia    Hypertension    Migraine    cluster   Neuromuscular disorder ([place])     left lower extrem neuropathy   Obstructive sleep apnea    no OSA since had facial surgery with dr. Kathyrn Sheriff in 1997   PAD (peripheral artery disease) [facility]) Feb 2009   nonobstructing, renal angiogram [name]   Renal cell carcinoma 2004   left kidney heminephrectomy   Renal insufficiency    Sepsis ([place]) 11/06/2020   Stroke ([place])    a. 12/2019 MRI: Small acute infarcts involving the left cerebral hemisphere and several chronic infarcts.   TIA (transient ischemic attack)    no residual but left leg and foot still feel heavy   tobacco abuse     Patient Active Problem List   Diagnosis Date Noted   Carotid stenosis 02/17/2021   Benign prostatic hyperplasia with lower urinary tract symptoms 02/08/2021   Acquired complex renal cyst 02/08/2021   Atrial fibrillation with rapid ventricular response ([place]) 11/28/2020   COVID-19 virus infection 11/28/2020   Vitamin D deficiency 11/23/2020   Rectal bleeding 11/06/2020   Pneumoperitoneum 11/06/2020   Resistant hypertension 11/06/2020   Type II diabetes mellitus with renal manifestations ([place]) 11/06/2020   CAD (coronary artery disease) 11/06/2020   Acute on chronic anemia 11/06/2020   Anemia due to blood loss 11/06/2020   Hypotension, iatrogenic 08/20/2020   ESRD on peritoneal dialysis ([place]) 08/06/2020   HFrEF (heart failure with reduced ejection fraction) (HCC)    PAF (paroxysmal atrial fibrillation) ([place])  Anemia of chronic disease    Chronic HFrEF (heart failure with reduced ejection fraction) ([place]) 05/11/2020   Fluid overload 05/11/2020   End stage renal disease ([place]) 02/19/2020   Aortic atherosclerosis ([place]) 01/22/2020   Hospital discharge follow-up 01/22/2020   Neurologic deficit as late effect of ischemic cerebrovascular accident (CVA) 01/15/2020   Dissection of left subclavian artery ([place])    Cerebrovascular accident (CVA) ([place]) 01/14/2020   Constipation 05/13/2019   Cardiomyopathy ([place]) 04/26/2019   Coronary artery disease  involving native coronary artery of native heart without angina pectoris 04/26/2019   ESRD (end stage renal disease) ([place]) 04/26/2019   Chronic pain not due to malignancy 04/12/2019   Chronic bilateral low back pain with bilateral sciatica 11/27/2018   Synovial cyst of lumbar spine 11/27/2018   Uncontrolled hypertension 11/14/2018   CKD (chronic kidney disease), stage V ([place]) 11/14/2018   Major depressive disorder with current active episode 07/26/2017   Anemia of chronic kidney failure, stage 4 (severe) ([place]) 07/26/2017   DOE (dyspnea on exertion) 11/25/2016   S/P UVPP (uvulopalatopharyngoplasty) 08/22/2014   Presbyacusis 08/22/2014   Adenocarcinoma, renal cell ([place]) 02/26/2014   Renal cell carcinoma ([place]) 02/26/2014   Malignant neoplasm of kidney excluding renal pelvis (HCC) 02/26/2014   Prostate CA ([place]) 07/30/2013   Prostate cancer ([place]) 07/30/2013   Elevated serum alkaline phosphatase level 07/30/2013   Malignant neoplasm of prostate ([place]) 07/30/2013   Hypertensive renal sclerosis with hypertension 10/18/2012   Renal sclerosis with hypertension 94/85/4627   Complication of diabetes mellitus ([place]) 07/16/2012   PAD (peripheral artery disease) ([place])    Left hip pain 04/20/2012   Erectile dysfunction 04/20/2012   Hip pain 04/20/2012   tobacco abuse    Temporary cerebral vascular dysfunction 01/15/2012   Tobacco abuse counseling 05/06/2011   Counseling on substance use and abuse 05/06/2011   Hyperlipidemia    History of renal cell carcinoma     Past Surgical History:  Procedure Laterality Date   CAPD INSERTION N/A 03/01/2019   Procedure: LAPAROSCOPIC INSERTION CONTINUOUS AMBULATORY PERITONEAL DIALYSIS  (CAPD) CATHETER;  Surgeon: Algernon Huxley, MD;  Location: [facility] ORS;  Service: Vascular;  Laterality: N/A;   CARDIAC CATHETERIZATION     Dr. [name] did this to assess his renal artery   cyst removal  12/25/2015   Spine L4 and L5   heminephrectomy  2004   for renal cell CA   RENAL  CRYOABLATION  Jan 2006   right kidney,  [name]   sciatica      Prior to Admission medications   Medication Sig Start Date End Date Taking? Authorizing Provider  albuterol (VENTOLIN HFA) 108 (90 Base) MCG/ACT inhaler Inhale 2 puffs into the lungs every 6 (six) hours as needed for wheezing or shortness of breath. 03/25/21   Vickie Epley, MD  amLODipine (NORVASC) 10 MG tablet TAKE 1 TABLET BY MOUTH ONCE DAILY 03/23/21   Crecencio Mc, MD  apixaban (ELIQUIS) 5 MG TABS tablet Take 1 tablet (5 mg total) by mouth 2 (two) times daily. 03/25/21   Vickie Epley, MD  Ascorbic Acid 500 MG CHEW Chew 1 tablet by mouth daily.    [provider]  b complex vitamins capsule Take 1 capsule by mouth daily.    [provider]  bisoprolol (ZEBETA) 5 MG tablet Take 0.5 tablets (2.5 mg total) by mouth daily. 02/27/21 08/26/21  End, Harrell Gave, MD  calcitRIOL (ROCALTROL) 0.25 MCG capsule Take 0.25 mcg by mouth every Monday, Wednesday, and Friday.  [provider]  Cholecalciferol (VITAMIN D3 PO) Take 1 capsule by mouth daily.    [provider]  cloNIDine (CATAPRES - DOSED IN MG/24 HR) 0.1 mg/24hr patch Place 1 patch (0.1 mg total) onto the skin every Friday. 03/27/21   Vickie Epley, MD  diphenhydrAMINE (BENADRYL) 25 MG tablet Take 25 mg by mouth every 6 (six) hours as needed for allergies.    [provider]  doxazosin (CARDURA) 8 MG tablet Take 8 mg by mouth daily. 03/29/20   [provider]  fluticasone (FLONASE) 50 MCG/ACT nasal spray Place 2 sprays into both nostrils daily as needed for allergies or rhinitis.    [provider]  gentamicin cream (GARAMYCIN) 0.1 % Apply 1 application topically daily. 11/07/20   Fritzi Mandes, MD  irbesartan (AVAPRO) 300 MG tablet Take 1 tablet (300 mg total) by mouth daily. 08/10/20   Debbe Odea, MD  isosorbide mononitrate (IMDUR) 30 MG 24 hr tablet Take 30 mg by mouth daily.    [provider]   losartan (COZAAR) 100 MG tablet Take 100 mg by mouth daily. 02/19/21   [provider]  multivitamin (RENA-VIT) TABS tablet Take 1 tablet by mouth at bedtime. 12/21/19   [provider]  rosuvastatin (CRESTOR) 20 MG tablet TAKE 1 TABLET BY MOUTH AT BEDTIME 12/29/20   Theora Gianotti, NP  sevelamer (RENAGEL) 800 MG tablet Take 800 mg by mouth 3 (three) times daily with meals.    [provider]  tiZANidine (ZANAFLEX) 2 MG tablet Take 1 tablet (2 mg total) by mouth every 12 (twelve) hours as needed for muscle spasms. 11/21/20   Crecencio Mc, MD  torsemide (DEMADEX) 100 MG tablet Take 100 mg by mouth daily.    [provider]    Allergies Irbesartan, Cymbalta [duloxetine hcl], Hydralazine, Imdur [isosorbide nitrate], Atorvastatin, and Bystolic [nebivolol hcl]  Family History  Problem Relation Age of Onset   Hypertension Mother    Cancer Mother        breast   Aneurysm Mother    Coronary artery disease Father    Hypertension Father    Stroke Father 32   Heart disease Father    Heart attack Father 4   Aneurysm Maternal Grandmother        brain   Aneurysm Paternal Grandmother        brain   Coronary artery disease Paternal Grandfather    Heart disease Brother        valvular heart disease   COPD Brother    Hypertension Brother    Stroke Paternal Uncle     Social History Social History   Tobacco Use   Smoking status: Every Day    Packs/day: 1.00    Years: 50.00    Pack years: 50.00    Types: Cigarettes   Smokeless tobacco: Never  Vaping Use   Vaping Use: Former   Devices: tried but did not like  Substance Use Topics   Alcohol use: Not Currently   Drug use: No    Review of Systems Constitutional: No fever/chills Eyes: No visual changes. ENT: No sore throat. Cardiovascular: Denies chest pain. Respiratory: Positive for shortness of breath. Gastrointestinal: No abdominal pain.  No nausea, no vomiting.  No diarrhea.    Genitourinary: Negative for dysuria. Musculoskeletal: Negative for back pain. Skin: Negative for rash. Neurological: Negative for headaches, focal weakness or numbness.  ____________________________________________   PHYSICAL EXAM:  VITAL SIGNS: ED Triage Vitals [04/01/21 1036]  Enc  Vitals Group     BP (!) 188/90     Pulse Rate 72     Resp (!) 24     Temp (!) 97.4 F (36.3 C)     Temp Source Oral     SpO2 96 %     Weight 145 lb (65.8 kg)     Height 5\' 6"  (1.676 m)     Head Circumference      Peak Flow      Pain Score 0   Constitutional: Alert and oriented.  Eyes: Conjunctivae are normal.  ENT      Head: Normocephalic and atraumatic.      Nose: No congestion/rhinnorhea.      Mouth/Throat: Mucous membranes are moist.      Neck: No stridor. Hematological/Lymphatic/Immunilogical: No cervical lymphadenopathy. Cardiovascular: Normal rate, regular rhythm.  No murmurs, rubs, or gallops.  Respiratory: Normal respiratory effort without tachypnea nor retractions. Diffuse expiratory wheezing. Gastrointestinal: Soft and non tender. No rebound. No guarding.  Genitourinary: Deferred Musculoskeletal: Normal range of motion in all extremities. No lower extremity edema. Neurologic:  Normal speech and language. No gross focal neurologic deficits are appreciated.  Skin:  Skin is warm, dry and intact. No rash noted. Psychiatric: Mood and affect are normal. Speech and behavior are normal. Patient exhibits appropriate insight and judgment.  ____________________________________________    LABS (pertinent positives/negatives)  COVID, influenza negative CBC wbc 10.5, hgb 10.7, plt 149 Trop hs 20 BMP na 143, k 4.4, glu 141, cr 7.51  ____________________________________________   EKG  I, Nance Pear, attending physician, personally viewed and interpreted this EKG  EKG Time: 1043 Rate: 65 Rhythm: sinus rhythm with pvc Axis: normal Intervals: qtc 474 QRS: incomplete RBBB, q  waves v1, v2 ST changes: no st elevation Impression: abnormal ekg  ____________________________________________    RADIOLOGY  CXR Mild pulmonary edema, small bilateral pleural effusions.  ____________________________________________   PROCEDURES  Procedures  ____________________________________________   INITIAL IMPRESSION / ASSESSMENT AND PLAN / ED COURSE  Pertinent labs & imaging results that were available during my care of the patient were reviewed by me and considered in my medical decision making (see chart for details).   Patient presents to the emergency department today because of concerns for shortness of breath.  Patient does have incidental renal disease and is on dialysis.  He states he still makes urine.  Chest x-ray does show some pulmonary edema.  Will start patient on Lasix.  While here in the emergency department he had an episode of desaturation into the high 80s.  Because of this will plan on admission. ____________________________________________   FINAL CLINICAL IMPRESSION(S) / ED DIAGNOSES  Final diagnoses:  Hypoxia  Acute pulmonary edema ([place])     Note: This dictation was prepared with Dragon dictation. Any transcriptional errors that result from this process are unintentional     Nance Pear, MD 04/01/21 1439

## 2021-04-01 NOTE — Consult Note (Signed)
Cardiology Consultation:   Patient ID: [name] MRN: [id number]; DOB: 1945/12/19  Admit date: 04/01/2021 Date of Consult: 04/01/2021  PCP:  Crecencio Mc, MD   [facility] Providers Cardiologist:  [name], MD  Electrophysiologist:  Vickie Epley, MD       Patient Profile:   [name] is a 75 y.o. male with a hx of paroxysmal atrial fibrillation, end-stage renal disease on PD, hypertension who is being seen 04/01/2021 for the evaluation of shortness of breath at the request of Dr. Francine Graven.  History of Present Illness:   [name] cardiomyopathy last EF 45 to 50%, end-stage renal disease on peritoneal dialysis, paroxysmal atrial fibrillation, PAD, hypertension, hyperlipidemia, diabetes presenting with worsening shortness of breath.  Had an acute episode of shortness of breath today, lower extremity swelling has been ongoing over the past several days prompting him to come to the emergency room.  He denies chest pain.  Anticoagulation/Eliquis previously stopped due to GI bleeds.  Seen about a week ago in the office by EP, Eliquis was restarted.  Denies any bleeding episodes.  Denies palpitations.  In the ED on admission, EKG showed sinus rhythm.  Chest x-ray showed pulmonary edema and bilateral effusions.  Oxygen sat was in the high 80s, patient was started on IV Lasix with improvement in symptoms.  While on the floor, telemetry monitor noted atrial fibrillation with rapid ventricular response.  Patient was sitting comfortably in bed eating dinner upon my exam without any symptoms of palpitations.  Heart rates in the 140s.  Just started on Cardizem drip.   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 12/2018 Echo: EF 40-45%,  global HK. Asc Ao 3.7cm; b. 01/2019 Lexi MV: small, mild, fixed basal and mid antlat defect - scar vs artifact. Small, mild mid and apical inf minimally reversible defect, likely scar w/ peri-infarct ischemia. Coronary and Ao atherosclerosis; c. 12/2019 Echo: EF 40-45%, glob HK, Gr1 DD. Nl RV fxn. Sev dil LA. Mild MR. Mild-mod Ao sclerosis w/o stenosis. Asc Ao 80mm.   Carotid arterial disease ([place])    a. 01/2020 RICA 1-39%, RCCA/RECA < 66%, LICA 2-94%, LCCA/LECA <50%.   Claudication ([place])    a. 02/2020 ABI/TBI: R 1.08/0.95, L 1.00/0.70.   Complication of anesthesia    had to be woken up slowly as his bp was elevated when did this quickly   Diabetes mellitus without complication (HCC)    ESRD (end stage renal disease) ([place])    a. Peritoneal Dialysis pt.   Hyperlipidemia    Hypertension    Migraine    cluster   Neuromuscular disorder ([place])    left lower extrem neuropathy   Obstructive sleep apnea    no OSA since had facial surgery with dr. Kathyrn Sheriff in 1997   PAD (peripheral artery disease) [facility]) Feb 2009   nonobstructing, renal angiogram [name]   Renal cell carcinoma 2004   left kidney heminephrectomy   Renal insufficiency    Sepsis ([place]) 11/06/2020   Stroke ([place])    a. 12/2019 MRI: Small acute infarcts involving the left cerebral hemisphere and several chronic infarcts.   TIA (transient ischemic attack)    no residual but left leg and foot still feel heavy   tobacco abuse  Past Surgical History:  Procedure Laterality Date   CAPD INSERTION N/A 03/01/2019   Procedure: LAPAROSCOPIC INSERTION CONTINUOUS AMBULATORY PERITONEAL DIALYSIS  (CAPD) CATHETER;  Surgeon: Algernon Huxley, MD;  Location: [facility] ORS;  Service: Vascular;  Laterality: N/A;   CARDIAC CATHETERIZATION     Dr. [name] did this to assess his renal artery   cyst removal  12/25/2015   Spine L4 and L5   heminephrectomy  2004   for renal cell CA   RENAL CRYOABLATION  Jan 2006   right kidney,  [name]   sciatica       Home  Medications:  Prior to Admission medications   Medication Sig Start Date End Date Taking? Authorizing Provider  amLODipine (NORVASC) 10 MG tablet TAKE 1 TABLET BY MOUTH ONCE DAILY 03/23/21  Yes Crecencio Mc, MD  apixaban (ELIQUIS) 5 MG TABS tablet Take 1 tablet (5 mg total) by mouth 2 (two) times daily. 03/25/21  Yes Vickie Epley, MD  Ascorbic Acid 500 MG CHEW Chew 1 tablet by mouth daily.   Yes [provider]  b complex vitamins capsule Take 1 capsule by mouth daily.   Yes [provider]  bisoprolol (ZEBETA) 5 MG tablet Take 0.5 tablets (2.5 mg total) by mouth daily. 02/27/21 08/26/21 Yes End, Harrell Gave, MD  doxazosin (CARDURA) 8 MG tablet Take 8 mg by mouth daily. 03/29/20  Yes [provider]  losartan (COZAAR) 100 MG tablet Take 100 mg by mouth daily. 02/19/21  Yes [provider]  multivitamin (RENA-VIT) TABS tablet Take 1 tablet by mouth at bedtime. 12/21/19  Yes [provider]  rosuvastatin (CRESTOR) 20 MG tablet TAKE 1 TABLET BY MOUTH AT BEDTIME 12/29/20  Yes Theora Gianotti, NP  sevelamer (RENAGEL) 800 MG tablet Take 800 mg by mouth 3 (three) times daily with meals.   Yes [provider]  torsemide (DEMADEX) 100 MG tablet Take 100 mg by mouth daily.   Yes [provider]  albuterol (VENTOLIN HFA) 108 (90 Base) MCG/ACT inhaler Inhale 2 puffs into the lungs every 6 (six) hours as needed for wheezing or shortness of breath. 03/25/21   Vickie Epley, MD  calcitRIOL (ROCALTROL) 0.25 MCG capsule Take 0.25 mcg by mouth every Monday, Wednesday, and Friday.    [provider]  Cholecalciferol (VITAMIN D3 PO) Take 1 capsule by mouth daily. Patient not taking: No sig reported    [provider]  cloNIDine (CATAPRES - DOSED IN MG/24 HR) 0.1 mg/24hr patch Place 1 patch (0.1 mg total) onto the skin every Friday. 03/27/21   Vickie Epley, MD  diphenhydrAMINE (BENADRYL) 25 MG tablet Take 25 mg by mouth  every 6 (six) hours as needed for allergies.    [provider]  fluticasone (FLONASE) 50 MCG/ACT nasal spray Place 2 sprays into both nostrils daily as needed for allergies or rhinitis.    [provider]  gentamicin cream (GARAMYCIN) 0.1 % Apply 1 application topically daily. 11/07/20   Fritzi Mandes, MD  irbesartan (AVAPRO) 300 MG tablet Take 1 tablet (300 mg total) by mouth daily. Patient not taking: No sig reported 08/10/20   Debbe Odea, MD  isosorbide mononitrate (IMDUR) 30 MG 24 hr tablet Take 30 mg by mouth daily. Patient not taking: No sig reported    [provider]  tiZANidine (ZANAFLEX) 2 MG tablet Take 1 tablet (2 mg total) by mouth every 12 (twelve) hours as needed for muscle spasms. Patient not taking: No sig reported 11/21/20  Crecencio Mc, MD    Inpatient Medications: Scheduled Meds:  apixaban  5 mg Oral BID   [START ON 04/02/2021] bisoprolol  2.5 mg Oral Daily   calcitRIOL  0.25 mcg Oral Q M,W,F   [START ON 04/03/2021] cloNIDine  0.1 mg Transdermal Q Fri   [START ON 04/02/2021] doxazosin  8 mg Oral Daily   furosemide  80 mg Intravenous Q12H   isosorbide mononitrate  30 mg Oral Daily   multivitamin  1 tablet Oral QHS   nicotine  21 mg Transdermal Daily   rosuvastatin  20 mg Oral QHS   sevelamer carbonate  800 mg Oral TID WC   sodium chloride flush  3 mL Intravenous Q12H   Continuous Infusions:  sodium chloride     diltiazem (CARDIZEM) infusion 7.5 mg/hr (04/01/21 1750)   PRN Meds: sodium chloride, acetaminophen **OR** acetaminophen, fluticasone, hydrALAZINE, ondansetron **OR** ondansetron (ZOFRAN) IV, sodium chloride flush, tiZANidine  Allergies:    Allergies  Allergen Reactions   Irbesartan Other (See Comments)    hyperkalemia   Cymbalta [Duloxetine Hcl]    Hydralazine Other (See Comments)    headache   Imdur [Isosorbide Nitrate] Other (See Comments)    headache   Atorvastatin Other (See Comments)    Muscle pain   Bystolic  [Nebivolol Hcl]     Extreme fatigue     Social History:   Social History   Socioeconomic History   Marital status: Married    Spouse name: Robyn   Number of children: Not on file   Years of education: Not on file   Highest education level: Not on file  Occupational History   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  Tobacco Use   Smoking status: Every Day    Packs/day: 1.00    Years: 50.00    Pack years: 50.00    Types: Cigarettes   Smokeless tobacco: Never  Vaping Use   Vaping Use: Former   Devices: tried but did not like  Substance and Sexual Activity   Alcohol use: Not Currently   Drug use: No   Sexual activity: Yes  Other Topics Concern   Not on file  Social History Narrative   Not on file   Social Determinants of Health   Financial Resource Strain: Low Risk    Difficulty of Paying Living Expenses: Not hard at all  Food Insecurity: No Food Insecurity   Worried About Charity fundraiser in the Last Year: Never true   [place] in the Last Year: Never true  Transportation Needs: No Transportation Needs   Lack of Transportation (Medical): No   Lack of Transportation (Non-Medical): No  Physical Activity: Insufficiently Active   Days of Exercise per Week: 3 days   Minutes of Exercise per Session: 20 min  Stress: No Stress Concern Present   Feeling of Stress : Not at all  Social Connections: Not on file  Intimate Partner Violence: Not At Risk   Fear of Current or Ex-Partner: No   Emotionally Abused: No   Physically Abused: No   Sexually Abused: No    Family History:    Family History  Problem Relation Age of Onset   Hypertension Mother    Cancer Mother        breast   Aneurysm Mother    Coronary artery disease Father    Hypertension Father    Stroke Father 55   Heart disease Father    Heart attack Father 24  Aneurysm Maternal Grandmother        brain   Aneurysm Paternal Grandmother        brain   Coronary artery disease Paternal  Grandfather    Heart disease Brother        valvular heart disease   COPD Brother    Hypertension Brother    Stroke Paternal Uncle      ROS:  Please see the history of present illness.   All other ROS reviewed and negative.     Physical Exam/Data:   Vitals:   04/01/21 1430 04/01/21 1508 04/01/21 1531 04/01/21 1637  BP: (!) 199/87 (!) 208/86 (!) 187/73 (!) 208/80  Pulse: 61  70 78  Resp: 20  20 19   Temp:    98.2 F (36.8 C)  TempSrc:    Oral  SpO2: 96%  95% 95%  Weight:    65.8 kg  Height:    5\' 6"  (1.676 m)    Intake/Output Summary (Last 24 hours) at 04/01/2021 1755 Last data filed at 04/01/2021 1649 Gross per 24 hour  Intake 240 ml  Output --  Net 240 ml   Last 3 Weights 04/01/2021 04/01/2021 03/25/2021  Weight (lbs) 145 lb 1.6 oz 145 lb 152 lb  Weight (kg) 65.817 kg 65.772 kg 68.947 kg     Body mass index is 23.42 kg/m.  General: Mild respiratory distress HEENT: normal Neck: no JVD Vascular: No carotid bruits; Distal pulses 2+ bilaterally Cardiac: Irregular irregular, tachycardic Lungs: Rhonchorous breath sounds, diminished at bases Abd: soft, nontender, no hepatomegaly  Ext: trace edema Musculoskeletal:  No deformities, BUE and BLE strength normal and equal Skin: warm and dry  Neuro:  CNs 2-12 intact, no focal abnormalities noted Psych:  Normal affect   EKG:  The EKG was personally reviewed and demonstrates: Normal sinus rhythm Telemetry:  Telemetry was personally reviewed and demonstrates: Atrial fibrillation  Relevant CV Studies:  TTE 01/2021 1. Left ventricular ejection fraction, by estimation, is 45 to 50%. The  left ventricle has mildly decreased function. The left ventricle  demonstrates global hypokinesis. The left ventricular internal cavity size  was mildly to moderately dilated. There  is moderate left ventricular hypertrophy. Left ventricular diastolic  parameters are consistent with Grade I diastolic dysfunction (impaired  relaxation).  Elevated left atrial pressure. The average left ventricular  global longitudinal strain is -12.4 %.  The global longitudinal strain is abnormal.   2. Right ventricular systolic function is normal. The right ventricular  size is normal. There is normal pulmonary artery systolic pressure.   3. Left atrial size was mild to moderately dilated.   4. The mitral valve is abnormal. Mild mitral valve regurgitation. No  evidence of mitral stenosis.   5. The aortic valve is tricuspid. There is mild calcification of the  aortic valve. There is mild thickening of the aortic valve. Aortic valve  regurgitation is mild. Mild to moderate aortic valve  sclerosis/calcification is present, without any evidence  of aortic stenosis.   6. There is mild dilatation of the ascending aorta, measuring 39 mm.   7. The inferior vena cava is normal in size with greater than 50%  respiratory variability, suggesting right atrial pressure of 3 mmHg.   Laboratory Data:  High Sensitivity Troponin:   Recent Labs  Lab 04/01/21 1128 04/01/21 1311  TROPONINIHS 20* 20*     Chemistry Recent Labs  Lab 04/01/21 1128  NA 143  K 4.4  CL 110  CO2 23  GLUCOSE 141*  BUN 58*  CREATININE 7.51*  CALCIUM 9.0  GFRNONAA 7*  ANIONGAP 10    No results for input(s): PROT, ALBUMIN, AST, ALT, ALKPHOS, BILITOT in the last 168 hours. Lipids No results for input(s): CHOL, TRIG, HDL, LABVLDL, LDLCALC, CHOLHDL in the last 168 hours.  Hematology Recent Labs  Lab 04/01/21 1128  WBC 10.5  RBC 3.56*  HGB 10.7*  HCT 32.5*  MCV 91.3  MCH 30.1  MCHC 32.9  RDW 14.5  PLT 149*   Thyroid No results for input(s): TSH, FREET4 in the last 168 hours.  BNPNo results for input(s): BNP, PROBNP in the last 168 hours.  DDimer No results for input(s): DDIMER in the last 168 hours.   Radiology/Studies:  DG Chest 2 View  Result Date: 04/01/2021 CLINICAL DATA:  Shortness of breath EXAM: CHEST - 2 VIEW COMPARISON:  Radiograph 11/28/2020  FINDINGS: Unchanged cardiomediastinal silhouette. Mild interstitial opacities. There are new small bilateral pleural effusions, left greater than right with bibasilar atelectasis. No pneumothorax. No acute osseous abnormality. IMPRESSION: Mild pulmonary edema with small bilateral pleural effusions and adjacent basilar atelectasis Electronically Signed   By: Maurine Simmering M.D.   On: 04/01/2021 11:04     Assessment and Plan:   Paroxysmal atrial fibrillation -Currently in A. fib RVR -Acute volume overload likely contributed to A. fib RVR -Continue Cardizem for now, to achieve heart rate control -We will try to avoid Cardizem long-term due to cardiomyopathy. -Restart PTA bisoprolol 2.5 mg daily.  Consider increasing to 5 mg if heart rates not adequately controlled. -Eliquis 5 mg twice daily  2.  Cardiomyopathy, last EF 45% -Ischemic work-up declined previously by patient and also due to bleeding issues. -Bisoprolol 2.5mg  qd, irbesartan 300 mg daily -Lasix 80 IV twice daily -PD for added volume control  3.  Hypertension - bp Elevated, restart PTA norvasc, clonidine, irbesartan, Imdur, bisoprolol.   -Consider titrating clonidine.  4.  End-stage renal disease on PD -Dialysis as per nephrology   Total encounter time 110 minutes  Greater than 50% was spent in counseling and coordination of care with the patient    Signed, Kate Sable, MD  04/01/2021 5:55 PM

## 2021-04-01 NOTE — ED Notes (Signed)
Patient given cup of water.

## 2021-04-01 NOTE — ED Notes (Signed)
Pt O2 sat dropped 77% after ambulating to bathroom. O2 sat 97% now

## 2021-04-01 NOTE — ED Notes (Signed)
Pt O2 sat around 87-88%. Pt placed on 2L [place], O2 sat 95%

## 2021-04-01 NOTE — ED Triage Notes (Signed)
Pt to ED for shob for weeks, worsening last night.  Denies chest pain. Reports wearing heart monitor for a fib, hx of a fib  Pt speaking in short sentences, provided w/c to triage.

## 2021-04-01 NOTE — ED Notes (Signed)
Peritoneal dialysis at home last night

## 2021-04-01 NOTE — Progress Notes (Signed)
Notified by RN that patient's heart rate is currently in the 150s and on the monitor he is noted to be in A. fib with a rapid ventricular rate. Patient has a known history of atrial fibrillation. Will give 1 dose of Cardizem 10 mg IV push and start patient on a Cardizem drip

## 2021-04-01 NOTE — H&P (Addendum)
History and Physical    [name]:[id number] DOB: 06-Jul-1945 DOA: 04/01/2021  PCP: Crecencio Mc, MD   Patient coming from: Home  I have personally briefly reviewed patient's old medical records in [place]  Chief Complaint: Shortness of breath  HPI: [name] is a 75 y.o. male with medical history significant for CAD, chronic combined systolic and diastolic heart failure, last EF 45 to 50% September 2022, ESRD on PD, hypertension, peripheral arterial disease, sleep apnea, renal cell carcinoma status post left heminephrectomy,history of CVA, nicotine dependence who presents to the emergency room for evaluation of worsening shortness of breath from his baseline.  Patient states that he has had difficulty breathing with exertion but one night prior to his admission he developed worsening shortness of breath especially at rest associated with orthopnea.  Patient has a cough productive of clear phlegm and has had chills but denies having any fever. He denies having any chest pain, no nausea, no vomiting, no abdominal pain, no changes in his bowel habits, no urinary symptoms, no headache, no dizziness, no lightheadedness, no palpitations, no diaphoresis, no blurred vision, no focal deficits.   Labs show sodium 143, potassium 4.4, chloride 110, bicarb 23, glucose 141, BUN 58, creatinine 7.51, calcium 9.0, troponin 20, white count 10.5, hemoglobin 10.7, hematocrit 32.5, MCV 91.3, RDW 14.5, platelet count 149 Respiratory viral panel is negative CXR shows mild pulmonary edema with small bilateral pleural effusions and adjacent basilar atelectasis Twelve-lead EKG reviewed by me shows sinus rhythm with occasional PVCs, incomplete right bundle branch block.     ED Course: Patient is a 75 year old male with a history of end-stage renal disease on peritoneal dialysis who presents to the ER for evaluation of worsening shortness of breath from his baseline and is found to have  bilateral pleural effusions. ER patient had room air pulse oximetry of 77% and is currently on 2 L of oxygen with improvement in his pulse oximetry to 97%. Patient still voids and received a dose of lasix in the ER. He will be admitted to the hospital for further evaluation.   Review of Systems: As per HPI otherwise all other systems reviewed and negative.    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 12/2018 Echo: EF 40-45%, global HK. Asc Ao 3.7cm; b. 01/2019 Lexi MV: small, mild, fixed basal and mid antlat defect - scar vs artifact. Small, mild mid and apical inf minimally reversible defect, likely scar w/ peri-infarct ischemia. Coronary and Ao atherosclerosis; c. 12/2019 Echo: EF 40-45%, glob HK, Gr1 DD. Nl RV fxn. Sev dil LA. Mild MR. Mild-mod Ao sclerosis w/o stenosis. Asc Ao 61mm.   Carotid arterial disease ([place])    a. 01/2020 RICA 1-39%, RCCA/RECA < 73%, LICA 5-32%, LCCA/LECA <50%.   Claudication ([place])    a. 02/2020 ABI/TBI: R 1.08/0.95, L 1.00/0.70.   Complication of anesthesia    had to be woken up slowly as his bp was elevated when did this quickly   Diabetes mellitus without complication (HCC)    ESRD (end stage renal disease) ([place])    a. Peritoneal Dialysis pt.   Hyperlipidemia    Hypertension    Migraine    cluster   Neuromuscular disorder ([place])    left  lower extrem neuropathy   Obstructive sleep apnea    no OSA since had facial surgery with dr. Kathyrn Sheriff in 1997   PAD (peripheral artery disease) [facility]) Feb 2009   nonobstructing, renal angiogram [name]   Renal cell carcinoma 2004   left kidney heminephrectomy   Renal insufficiency    Sepsis ([place]) 11/06/2020   Stroke ([place])    a. 12/2019 MRI: Small acute infarcts involving the left cerebral  hemisphere and several chronic infarcts.   TIA (transient ischemic attack)    no residual but left leg and foot still feel heavy   tobacco abuse     Past Surgical History:  Procedure Laterality Date   CAPD INSERTION N/A 03/01/2019   Procedure: LAPAROSCOPIC INSERTION CONTINUOUS AMBULATORY PERITONEAL DIALYSIS  (CAPD) CATHETER;  Surgeon: Algernon Huxley, MD;  Location: [facility] ORS;  Service: Vascular;  Laterality: N/A;   CARDIAC CATHETERIZATION     Dr. [name] did this to assess his renal artery   cyst removal  12/25/2015   Spine L4 and L5   heminephrectomy  2004   for renal cell CA   RENAL CRYOABLATION  Jan 2006   right kidney,  [name]   sciatica       reports that he has been smoking cigarettes. He has a 50.00 pack-year smoking history. He has never used smokeless tobacco. He reports that he does not currently use alcohol. He reports that he does not use drugs.  Allergies  Allergen Reactions   Irbesartan Other (See Comments)    hyperkalemia   Cymbalta [Duloxetine Hcl]    Hydralazine Other (See Comments)    headache   Imdur [Isosorbide Nitrate] Other (See Comments)    headache   Atorvastatin Other (See Comments)    Muscle pain   Bystolic [Nebivolol Hcl]     Extreme fatigue     Family History  Problem Relation Age of Onset   Hypertension Mother    Cancer Mother        breast   Aneurysm Mother    Coronary artery disease Father    Hypertension Father    Stroke Father 27   Heart disease Father    Heart attack Father 67   Aneurysm Maternal Grandmother        brain   Aneurysm Paternal Grandmother        brain   Coronary artery disease Paternal Grandfather    Heart disease Brother        valvular heart disease   COPD Brother    Hypertension Brother    Stroke Paternal Uncle       Prior to Admission medications   Medication Sig Start Date End Date Taking? Authorizing Provider  albuterol (VENTOLIN HFA) 108 (90 Base) MCG/ACT inhaler Inhale 2 puffs into the lungs every 6  (six) hours as needed for wheezing or shortness of breath. 03/25/21   Vickie Epley, MD  amLODipine (NORVASC) 10 MG tablet TAKE 1 TABLET BY MOUTH ONCE DAILY 03/23/21   Crecencio Mc, MD  apixaban (ELIQUIS) 5 MG TABS tablet Take 1 tablet (5 mg total) by mouth 2 (two) times daily. 03/25/21   Vickie Epley, MD  Ascorbic Acid 500 MG CHEW Chew 1 tablet by mouth daily.    [provider]  b complex vitamins capsule Take 1 capsule by mouth daily.    [provider]  bisoprolol (ZEBETA) 5 MG tablet Take 0.5 tablets (2.5 mg total) by mouth daily. 02/27/21 08/26/21  End, Harrell Gave, MD  calcitRIOL (  ROCALTROL) 0.25 MCG capsule Take 0.25 mcg by mouth every Monday, Wednesday, and Friday.    [provider]  Cholecalciferol (VITAMIN D3 PO) Take 1 capsule by mouth daily.    [provider]  cloNIDine (CATAPRES - DOSED IN MG/24 HR) 0.1 mg/24hr patch Place 1 patch (0.1 mg total) onto the skin every Friday. 03/27/21   Vickie Epley, MD  diphenhydrAMINE (BENADRYL) 25 MG tablet Take 25 mg by mouth every 6 (six) hours as needed for allergies.    [provider]  doxazosin (CARDURA) 8 MG tablet Take 8 mg by mouth daily. 03/29/20   [provider]  fluticasone (FLONASE) 50 MCG/ACT nasal spray Place 2 sprays into both nostrils daily as needed for allergies or rhinitis.    [provider]  gentamicin cream (GARAMYCIN) 0.1 % Apply 1 application topically daily. 11/07/20   Fritzi Mandes, MD  irbesartan (AVAPRO) 300 MG tablet Take 1 tablet (300 mg total) by mouth daily. 08/10/20   Debbe Odea, MD  isosorbide mononitrate (IMDUR) 30 MG 24 hr tablet Take 30 mg by mouth daily.    [provider]  losartan (COZAAR) 100 MG tablet Take 100 mg by mouth daily. 02/19/21   [provider]  multivitamin (RENA-VIT) TABS tablet Take 1 tablet by mouth at bedtime. 12/21/19   [provider]  rosuvastatin (CRESTOR) 20 MG tablet TAKE 1 TABLET BY  MOUTH AT BEDTIME 12/29/20   Theora Gianotti, NP  sevelamer (RENAGEL) 800 MG tablet Take 800 mg by mouth 3 (three) times daily with meals.    [provider]  tiZANidine (ZANAFLEX) 2 MG tablet Take 1 tablet (2 mg total) by mouth every 12 (twelve) hours as needed for muscle spasms. 11/21/20   Crecencio Mc, MD  torsemide (DEMADEX) 100 MG tablet Take 100 mg by mouth daily.    [provider]    Physical Exam: Vitals:   04/01/21 1300 04/01/21 1346 04/01/21 1430 04/01/21 1508  BP: (!) 204/86 (!) 212/86 (!) 199/87 (!) 208/86  Pulse: 69 61 61   Resp: 17 16 20    Temp:      TempSrc:      SpO2: 96% 95% 96%   Weight:      Height:         Vitals:   04/01/21 1300 04/01/21 1346 04/01/21 1430 04/01/21 1508  BP: (!) 204/86 (!) 212/86 (!) 199/87 (!) 208/86  Pulse: 69 61 61   Resp: 17 16 20    Temp:      TempSrc:      SpO2: 96% 95% 96%   Weight:      Height:          Constitutional: Alert and oriented x 3 . Appears to be in moderate respiratory distress.  Chronically ill-appearing HEENT:      Head: Normocephalic and atraumatic.         Eyes: PERLA, EOMI, Conjunctivae pallor. Sclera is non-icteric.       Mouth/Throat: Mucous membranes are moist.       Neck: Supple with no signs of meningismus. Cardiovascular: Regular rate and rhythm. No murmurs, gallops, or rubs. 2+ symmetrical distal pulses are present . No JVD. Trace LE edema Respiratory: Tachypneic.crackles at the bases bilaterally. No wheezes or rhonchi.  Gastrointestinal: Soft, non tender, and non distended with positive bowel sounds.  Genitourinary: No CVA tenderness. Musculoskeletal: Nontender with normal range of motion in all extremities. No cyanosis, or erythema of extremities. Neurologic:  Face is symmetric. Moving  all extremities. No gross focal neurologic deficits . Skin: Skin is warm, dry.  No rash or ulcers Psychiatric: Mood and affect are normal    Labs on Admission: I have personally reviewed  following labs and imaging studies  CBC: Recent Labs  Lab 04/01/21 1128  WBC 10.5  HGB 10.7*  HCT 32.5*  MCV 91.3  PLT 923*   Basic Metabolic Panel: Recent Labs  Lab 04/01/21 1128  NA 143  K 4.4  CL 110  CO2 23  GLUCOSE 141*  BUN 58*  CREATININE 7.51*  CALCIUM 9.0   GFR: Estimated Creatinine Clearance: 7.7 mL/min (A) (by C-G formula based on SCr of 7.51 mg/dL (H)). Liver Function Tests: No results for input(s): AST, ALT, ALKPHOS, BILITOT, PROT, ALBUMIN in the last 168 hours. No results for input(s): LIPASE, AMYLASE in the last 168 hours. No results for input(s): AMMONIA in the last 168 hours. Coagulation Profile: No results for input(s): INR, PROTIME in the last 168 hours. Cardiac Enzymes: No results for input(s): CKTOTAL, CKMB, CKMBINDEX, TROPONINI in the last 168 hours. BNP (last 3 results) No results for input(s): PROBNP in the last 8760 hours. HbA1C: No results for input(s): HGBA1C in the last 72 hours. CBG: No results for input(s): GLUCAP in the last 168 hours. Lipid Profile: No results for input(s): CHOL, HDL, LDLCALC, TRIG, CHOLHDL, LDLDIRECT in the last 72 hours. Thyroid Function Tests: No results for input(s): TSH, T4TOTAL, FREET4, T3FREE, THYROIDAB in the last 72 hours. Anemia Panel: No results for input(s): VITAMINB12, FOLATE, FERRITIN, TIBC, IRON, RETICCTPCT in the last 72 hours. Urine analysis:    Component Value Date/Time   COLORURINE STRAW (A) 11/28/2020 1055   APPEARANCEUR CLEAR (A) 11/28/2020 1055   APPEARANCEUR Clear 01/03/2012 1117   LABSPEC 1.008 11/28/2020 1055   LABSPEC 1.004 01/03/2012 1117   PHURINE 7.0 11/28/2020 1055   GLUCOSEU 50 (A) 11/28/2020 1055   GLUCOSEU 100 (A) 01/21/2020 1223   HGBUR NEGATIVE 11/28/2020 [place] 11/28/2020 1055   BILIRUBINUR neg 07/30/2013 1127   BILIRUBINUR Negative 01/03/2012 1117   KETONESUR NEGATIVE 11/28/2020 1055   PROTEINUR 30 (A) 11/28/2020 1055   UROBILINOGEN 0.2 01/21/2020  1223   NITRITE NEGATIVE 11/28/2020 1055   LEUKOCYTESUR NEGATIVE 11/28/2020 1055   LEUKOCYTESUR Negative 01/03/2012 1117    Radiological Exams on Admission: DG Chest 2 View  Result Date: 04/01/2021 CLINICAL DATA:  Shortness of breath EXAM: CHEST - 2 VIEW COMPARISON:  Radiograph 11/28/2020 FINDINGS: Unchanged cardiomediastinal silhouette. Mild interstitial opacities. There are new small bilateral pleural effusions, left greater than right with bibasilar atelectasis. No pneumothorax. No acute osseous abnormality. IMPRESSION: Mild pulmonary edema with small bilateral pleural effusions and adjacent basilar atelectasis Electronically Signed   By: Maurine Simmering M.D.   On: 04/01/2021 11:04     Assessment/Plan Principal Problem:   Acute on chronic combined systolic (congestive) and diastolic (congestive) heart failure (HCC) Active Problems:   Coronary artery disease involving native coronary artery of native heart without angina pectoris   Hypertensive urgency   PAF (paroxysmal atrial fibrillation) (HCC)   ESRD on peritoneal dialysis ([place])   Diabetes mellitus with ESRD (end-stage renal disease) ([place])   CAD (coronary artery disease)      Patient is a 75 year old male with a history of hypertension and end-stage renal disease on peritoneal dialysis who presents for evaluation of worsening shortness of breath and is being admitted to the hospital for acute on chronic combined systolic and diastolic dysfunction CHF.  Acute on chronic combined systolic and diastolic congestive heart failure ([place]) Most likely related to hypertensive urgency and hypertensive heart disease Last 2D echocardiogram from September shows an LVEF of 45 to 50%. Patient's blood pressure is uncontrolled with systolic blood pressure in the 200s Optimize blood pressure control Place patient on Lasix 80 mg IV every 12 Consult nephrology for PD      Acute respiratory failure Patient presents with tachypnea, increased  work of breathing and was hypoxic with room air pulse oximetry of 77% He is currently on 2 L of oxygen with improvement in his pulse oximetry to 97% Acute respiratory failure appears to be secondary to acute CHF exacerbation He will need to be assessed for home oxygen need prior to discharge     Hypertensive urgency Optimize blood pressure control Patient is on antihypertensive medications which should be resumed extremities hospitalization Will place patient on hydralazine 10 mg IV every 6 hours as needed for systolic blood pressure greater than 183mmHg         ESRD on peritoneal dialysis [facility]) We will request nephrology consult for peritoneal dialysis while inpatient       Paroxysmal atrial fibrillation Continue bisoprolol for rate control Continue apixaban      Nicotine dependence Smoking cessation was discussed with patient in detail. (About 5 minutes was spent) Will place patient on a nicotine transdermal patch 21 mg daily        Anemia of end-stage renal disease Hemoglobin at baseline      History of CVA without residual deficit History of PAD/CAD Continue apixaban and statins      DVT prophylaxis: Apixaban Code Status: full code  Family Communication: Greater than 50% of time spent discussing patient's condition and plan of care with him at the bedside.  All questions and concerns have been addressed.  He verbalizes understanding and agrees with the plan.  CODE STATUS was discussed and he wishes to be full code.  He lists his wife as his healthcare power of attorney. Disposition Plan: Back to previous home environment Consults called: Nephrology Status:At the time of admission, it appears that the appropriate admission status for this patient is inpatient. This is judged to be reasonable and necessary to provide the required intensity of service to ensure the patient's safety given the presenting symptoms, physical exam findings, and initial radiographic  and laboratory data in the context of their comorbid conditions. Patient requires inpatient status due to high intensity of service, high risk for further deterioration and high frequency of surveillance required.     Collier Bullock MD [name]     04/01/2021, 3:18 PM

## 2021-04-01 NOTE — ED Notes (Signed)
Brett Balboa MD made aware of pts bp of 210/77

## 2021-04-02 DIAGNOSIS — I25118 Atherosclerotic heart disease of native coronary artery with other forms of angina pectoris: Secondary | ICD-10-CM | POA: Diagnosis not present

## 2021-04-02 DIAGNOSIS — I160 Hypertensive urgency: Secondary | ICD-10-CM

## 2021-04-02 DIAGNOSIS — R0902 Hypoxemia: Secondary | ICD-10-CM

## 2021-04-02 DIAGNOSIS — J810 Acute pulmonary edema: Secondary | ICD-10-CM

## 2021-04-02 DIAGNOSIS — I2510 Atherosclerotic heart disease of native coronary artery without angina pectoris: Secondary | ICD-10-CM | POA: Diagnosis not present

## 2021-04-02 DIAGNOSIS — I5043 Acute on chronic combined systolic (congestive) and diastolic (congestive) heart failure: Secondary | ICD-10-CM | POA: Diagnosis not present

## 2021-04-02 LAB — CBC
HCT: 27.9 % — ABNORMAL LOW (ref 39.0–52.0)
Hemoglobin: 9.1 g/dL — ABNORMAL LOW (ref 13.0–17.0)
MCH: 29.8 pg (ref 26.0–34.0)
MCHC: 32.6 g/dL (ref 30.0–36.0)
MCV: 91.5 fL (ref 80.0–100.0)
Platelets: 159 10*3/uL (ref 150–400)
RBC: 3.05 MIL/uL — ABNORMAL LOW (ref 4.22–5.81)
RDW: 14.6 % (ref 11.5–15.5)
WBC: 8.8 10*3/uL (ref 4.0–10.5)
nRBC: 0 % (ref 0.0–0.2)

## 2021-04-02 LAB — BASIC METABOLIC PANEL
Anion gap: 9 (ref 5–15)
BUN: 65 mg/dL — ABNORMAL HIGH (ref 8–23)
CO2: 20 mmol/L — ABNORMAL LOW (ref 22–32)
Calcium: 8.3 mg/dL — ABNORMAL LOW (ref 8.9–10.3)
Chloride: 108 mmol/L (ref 98–111)
Creatinine, Ser: 8.11 mg/dL — ABNORMAL HIGH (ref 0.61–1.24)
GFR, Estimated: 6 mL/min — ABNORMAL LOW (ref >60)
Glucose, Bld: 307 mg/dL — ABNORMAL HIGH (ref 70–99)
Potassium: 3.9 mmol/L (ref 3.5–5.1)
Sodium: 137 mmol/L (ref 135–145)

## 2021-04-02 LAB — IRON AND TIBC
Iron: 77 ug/dL (ref 45–182)
Saturation Ratios: 37 % (ref 17.9–39.5)
TIBC: 209 ug/dL — ABNORMAL LOW (ref 250–450)
UIBC: 132 ug/dL

## 2021-04-02 LAB — VITAMIN B12: Vitamin B-12: 564 pg/mL (ref 180–914)

## 2021-04-02 LAB — TSH: TSH: 1.058 u[IU]/mL (ref 0.350–4.500)

## 2021-04-02 LAB — VITAMIN D 25 HYDROXY (VIT D DEFICIENCY, FRACTURES): Vit D, 25-Hydroxy: 38.65 ng/mL (ref 30–100)

## 2021-04-02 LAB — FOLATE: Folate: 16.2 ng/mL (ref >5.9)

## 2021-04-02 LAB — GLUCOSE, CAPILLARY: Glucose-Capillary: 170 mg/dL — ABNORMAL HIGH (ref 70–99)

## 2021-04-02 LAB — PHOSPHORUS: Phosphorus: 6.2 mg/dL — ABNORMAL HIGH (ref 2.5–4.6)

## 2021-04-02 LAB — MAGNESIUM: Magnesium: 2.5 mg/dL — ABNORMAL HIGH (ref 1.7–2.4)

## 2021-04-02 MED ORDER — DELFLEX-LC/2.5% DEXTROSE 394 MOSM/L IP SOLN
INTRAPERITONEAL | Status: DC
  Filled 2021-04-02 (×4): qty 3000

## 2021-04-02 MED ORDER — BISOPROLOL FUMARATE 5 MG PO TABS
5.0000 mg | ORAL_TABLET | Freq: Every day | ORAL | Status: DC
  Administered 2021-04-03 – 2021-04-04 (×2): 5 mg via ORAL
  Filled 2021-04-02 (×2): qty 1

## 2021-04-02 MED ORDER — BISOPROLOL FUMARATE 5 MG PO TABS
2.5000 mg | ORAL_TABLET | Freq: Once | ORAL | Status: AC
  Administered 2021-04-02: 2.5 mg via ORAL
  Filled 2021-04-02 (×2): qty 0.5

## 2021-04-02 MED ORDER — FUROSEMIDE 10 MG/ML IJ SOLN
8.0000 mg/h | INTRAVENOUS | Status: DC
  Administered 2021-04-02 – 2021-04-03 (×2): 8 mg/h via INTRAVENOUS
  Filled 2021-04-02 (×2): qty 20

## 2021-04-02 MED ORDER — GENTAMICIN SULFATE 0.1 % EX OINT
TOPICAL_OINTMENT | Freq: Every day | CUTANEOUS | Status: DC
  Administered 2021-04-03 – 2021-04-04 (×2): 1 via TOPICAL
  Filled 2021-04-02: qty 15

## 2021-04-02 MED ORDER — AMLODIPINE BESYLATE 10 MG PO TABS
10.0000 mg | ORAL_TABLET | Freq: Every day | ORAL | Status: DC
  Administered 2021-04-02 – 2021-04-04 (×3): 10 mg via ORAL
  Filled 2021-04-02 (×3): qty 1

## 2021-04-02 NOTE — Progress Notes (Signed)
[name] Progress Note  Patient: [name]    QQV:[id number]  DOA: 04/01/2021     Date of Service: the patient was seen and examined on 04/02/2021  Chief Complaint  Patient presents with   Shortness of Breath   Brief hospital course: [name] is a 75 y.o. male with medical history significant for CAD, chronic combined systolic and diastolic heart failure, last EF 45 to 50% September 2022, ESRD on PD, hypertension, peripheral arterial disease, sleep apnea, renal cell carcinoma status post left heminephrectomy,history of CVA, nicotine dependence who presents to the emergency room for evaluation of worsening shortness of breath from his baseline.  Patient states that he has had difficulty breathing with exertion but one night prior to his admission he developed worsening shortness of breath especially at rest associated with orthopnea.  Patient has a cough productive of clear phlegm and has had chills but denies having any fever. He denies having any chest pain, no nausea, no vomiting, no abdominal pain, no changes in his bowel habits, no urinary symptoms, no headache, no dizziness, no lightheadedness, no palpitations, no diaphoresis, no blurred vision, no focal deficits.   Labs show sodium 143, potassium 4.4, chloride 110, bicarb 23, glucose 141, BUN 58, creatinine 7.51, calcium 9.0, troponin 20, white count 10.5, hemoglobin 10.7, hematocrit 32.5, MCV 91.3, RDW 14.5, platelet count 149 Respiratory viral panel is negative CXR shows mild pulmonary edema with small bilateral pleural effusions and adjacent basilar atelectasis Twelve-lead EKG reviewed by me shows sinus rhythm with occasional PVCs, incomplete right bundle branch block.       ED Course: Patient is a 75 year old male with a history of end-stage renal disease on peritoneal dialysis who presents to the ER for evaluation of worsening shortness of breath from his baseline and is found to have bilateral pleural  effusions. ER patient had room air pulse oximetry of 77% and is currently on 2 L of oxygen with improvement in his pulse oximetry to 97%. Patient still voids and received a dose of lasix in the ER. He will be admitted to the hospital for further evaluation.    Assessment and Plan: Principal Problem:   Acute on chronic combined systolic (congestive) and diastolic (congestive) heart failure (HCC) Active Problems:   Coronary artery disease involving native coronary artery of native heart without angina pectoris   Hypertensive urgency   PAF (paroxysmal atrial fibrillation) (HCC)   ESRD on peritoneal dialysis ([place])   Diabetes mellitus with ESRD (end-stage renal disease) ([place])   CAD (coronary artery disease)         Patient is a 75 year old male with a history of hypertension and end-stage renal disease on peritoneal dialysis who presents for evaluation of worsening shortness of breath and is being admitted to the hospital for acute on chronic combined systolic and diastolic dysfunction CHF.         # Acute on chronic combined systolic and diastolic congestive heart failure Most likely related to hypertensive urgency and hypertensive heart disease Last 2D echocardiogram from September shows an LVEF of 45 to 50%. Patient's blood pressure is uncontrolled with systolic blood pressure in the 200s Optimize blood pressure control S/p Lasix 80 mg IV every 12, started Lasix IV infusion by nephrology Consult nephrology for PD     Acute respiratory failure Patient presents with tachypnea, increased work of breathing and was hypoxic with room air pulse oximetry of 77% He is currently on 2 L of oxygen with improvement in his pulse oximetry  to 97% Acute respiratory failure appears to be secondary to acute CHF exacerbation He will need to be assessed for home oxygen need prior to discharge      Hypertensive urgency Optimize blood pressure control Patient is on antihypertensive medications  which should be resumed extremities hospitalization Continue hydralazine 10 mg IV every 6 hours as needed for systolic blood pressure greater than 161mmHg     # ESRD on peritoneal dialysis We will request nephrology consult for peritoneal dialysis while inpatient    # Paroxysmal atrial fibrillation Patient developed A. fib with RVR secondary to volume overload and shortness of breath S/p Cardizem IV infusion Continue bisoprolol for rate control Continue apixaban  Audiology consulted   # Nicotine dependence Smoking cessation was discussed with patient in detail. (About 5 minutes was spent) Will place patient on a nicotine transdermal patch 21 mg daily      # Anemia of end-stage renal disease Hemoglobin at baseline     # History of CVA without residual deficit History of PAD/CAD Continue apixaban and statins    Body mass index is 23.61 kg/m.  Interventions:       Diet: Renal  DVT Prophylaxis: Therapeutic Anticoagulation with Eliquis    Advance goals of care discussion: Full code  Family Communication: family was NOT present at bedside, at the time of interview.  The pt provided permission to discuss medical plan with the family. Opportunity was given to ask question and all questions were answered satisfactorily.   Disposition:  Pt is from Home, admitted with SOB, ordered, A. fib with RVR, still volume overload, on IV Lasix infusion, which precludes a safe discharge. Discharge to home, when stable and cleared by nephrology and cardiology, currently patient is on IV Lasix infusion.  Subjective: Patient was admitted overnight due to shortness of breath which has been improved after diuresis.  Patient said that his peritoneal dialysis did not work last night.  Currently patient's heart rate is under control, patient denies any chest pain or palpitations, no any other active issues,  Physical Exam: General:  alert oriented to time, place, and person.  Appear in no distress,  affect appropriate Eyes: PERRLA ENT: Oral Mucosa Clear, moist  Neck: no JVD,  Cardiovascular: S1 and S2 Present, no Murmur,  Respiratory: good respiratory effort, Bilateral Air entry equal and Decreased, mild Crackles, no wheezes Abdomen: Bowel Sound present, Soft and no tenderness,  Skin: no rashes Extremities: mild Pedal edema, no calf tenderness Neurologic: without any new focal findings Gait not checked due to patient safety concerns  Vitals:   04/02/21 0406 04/02/21 0557 04/02/21 0718 04/02/21 1204  BP: (!) 175/74  (!) 154/57 (!) 184/78  Pulse: 71  73 72  Resp: 18  19 19   Temp: 98.6 F (37 C)  98.3 F (36.8 C) 98.1 F (36.7 C)  TempSrc:      SpO2: 100%  98%   Weight:  66.4 kg    Height:        Intake/Output Summary (Last 24 hours) at 04/02/2021 1444 Last data filed at 04/02/2021 1347 Gross per 24 hour  Intake 1214.88 ml  Output 2575 ml  Net -1360.12 ml   Filed Weights   04/01/21 1036 04/01/21 1637 04/02/21 0557  Weight: 65.8 kg 65.8 kg 66.4 kg    Data Reviewed: I have personally reviewed and interpreted daily labs, tele strips, imagings as discussed above. I reviewed all nursing notes, pharmacy notes, vitals, pertinent old records I have discussed plan of care as  described above with RN and patient/family.  CBC: Recent Labs  Lab 04/01/21 1128 04/02/21 0314  WBC 10.5 8.8  HGB 10.7* 9.1*  HCT 32.5* 27.9*  MCV 91.3 91.5  PLT 149* 588   Basic Metabolic Panel: Recent Labs  Lab 04/01/21 1128 04/02/21 0314 04/02/21 0956  NA 143 137  --   K 4.4 3.9  --   CL 110 108  --   CO2 23 20*  --   GLUCOSE 141* 307*  --   BUN 58* 65*  --   CREATININE 7.51* 8.11*  --   CALCIUM 9.0 8.3*  --   MG  --   --  2.5*  PHOS  --   --  6.2*    Studies: No results found.  Scheduled Meds:  amLODipine  10 mg Oral Daily   apixaban  5 mg Oral BID   [START ON 04/03/2021] bisoprolol  5 mg Oral Daily   calcitRIOL  0.25 mcg Oral Q M,W,F   [START ON 04/03/2021] cloNIDine   0.1 mg Transdermal Q Fri   doxazosin  8 mg Oral Daily   gentamicin ointment   Topical Daily   isosorbide mononitrate  30 mg Oral Daily   multivitamin  1 tablet Oral QHS   nicotine  21 mg Transdermal Daily   rosuvastatin  20 mg Oral QHS   sevelamer carbonate  800 mg Oral TID WC   sodium chloride flush  3 mL Intravenous Q12H   Continuous Infusions:  sodium chloride     dialysis solution 1.5% low-MG/low-CA     dialysis solution 2.5% low-MG/low-CA     furosemide (LASIX) 200 mg in dextrose 5% 100 mL (2mg /mL) infusion 8 mg/hr (04/02/21 1156)   PRN Meds: sodium chloride, acetaminophen **OR** acetaminophen, fluticasone, heparin, hydrALAZINE, ondansetron **OR** ondansetron (ZOFRAN) IV, sodium chloride flush, tiZANidine  Time spent: 35 minutes  Author: Val Riles. MD [facility] 04/02/2021 2:44 PM  To reach On-call, see care teams to locate the attending and reach out to them via [email or web address]. If 7PM-7AM, please contact night-coverage If you still have difficulty reaching the attending provider, please page the [facility] (Director on Call) for [name] on amion for assistance.

## 2021-04-02 NOTE — Progress Notes (Addendum)
Patient's PD alarm has been going off (low drain)  on and off; called [phone] number for PD equipment w/no resolution. Notified HD on call nurse and was instructed to turn the machine off. HD nurse to eval/reassess this AM.

## 2021-04-02 NOTE — Progress Notes (Signed)
[facility]  ROUNDING NOTE   Subjective:   [name] is a 75 year old male with a past medical history of chronic combined systolic and diastolic heart failure with reported EF of 45 to 50%, hypertension, CAD, PAD, sleep apnea, renal cell carcinoma, and end-stage renal disease on peritoneal dialysis.  Patient presents to emergency room with shortness of breath and is admitted for Acute pulmonary edema ([place]) [J81.0] Hypoxia [R09.02] Acute on chronic combined systolic (congestive) and diastolic (congestive) heart failure ([place]) [I50.43]  Patient is known to our clinic and receives outpatient peritoneal dialysis follow-up through [facility], supervised by Dr. Juleen China.  Patient states he has had progressively worsening shortness of breath with exertion.  He decided to come to hospital when the shortness of breath progressed to when he was at rest also.  Reports a productive cough with chills but but fever unknown.  Denies nausea, vomiting, and diarrhea.  Denies headache and dizziness.  States his last dialysis treatment was on 03/31/2021.  He states he manually drains 3 cycles due to progressive shortness of breath.  This has occurred over the past few months.  When asked about his feels and cycles, patient states he feels with 2500 mL of fluid, and gets return of 2400 mL of fluid. We have been consulted to manage peritoneal dialysis needs during this admission.   Objective:  Vital signs in last 24 hours:  Temp:  [97.5 F (36.4 C)-98.6 F (37 C)] 98.1 F (36.7 C) (11/10 1204) Pulse Rate:  [61-127] 72 (11/10 1204) Resp:  [16-20] 19 (11/10 1204) BP: (118-212)/(57-87) 184/78 (11/10 1204) SpO2:  [95 %-100 %] 98 % (11/10 0718) Weight:  [65.8 kg-66.4 kg] 66.4 kg (11/10 0557)  Weight change:  Filed Weights   04/01/21 1036 04/01/21 1637 04/02/21 0557  Weight: 65.8 kg 65.8 kg 66.4 kg    Intake/Output: I/O last 3 completed shifts: In: 614.9 [P.O.:600; I.V.:14.9] Out: 1850  [Urine:1850]   Intake/Output this shift:  Total I/O In: 120 [P.O.:120] Out: 400 [Urine:400]  Physical Exam: General: NAD, sitting up in bed  Head: Normocephalic, atraumatic. Moist oral mucosal membranes  Eyes: Anicteric  Lungs:  Clear to auscultation, DOE, [place] O2  Heart: Regular rate and rhythm  Abdomen:  Soft, nontender, nondistended  Extremities: trace peripheral edema.  Neurologic: Nonfocal, moving all four extremities  Skin: No lesions  Access: PD catheter    Basic Metabolic Panel: Recent Labs  Lab 04/01/21 1128 04/02/21 0314 04/02/21 0956  NA 143 137  --   K 4.4 3.9  --   CL 110 108  --   CO2 23 20*  --   GLUCOSE 141* 307*  --   BUN 58* 65*  --   CREATININE 7.51* 8.11*  --   CALCIUM 9.0 8.3*  --   MG  --   --  2.5*  PHOS  --   --  6.2*    Liver Function Tests: No results for input(s): AST, ALT, ALKPHOS, BILITOT, PROT, ALBUMIN in the last 168 hours. No results for input(s): LIPASE, AMYLASE in the last 168 hours. No results for input(s): AMMONIA in the last 168 hours.  CBC: Recent Labs  Lab 04/01/21 1128 04/02/21 0314  WBC 10.5 8.8  HGB 10.7* 9.1*  HCT 32.5* 27.9*  MCV 91.3 91.5  PLT 149* 159    Cardiac Enzymes: No results for input(s): CKTOTAL, CKMB, CKMBINDEX, TROPONINI in the last 168 hours.  BNP: Invalid input(s): POCBNP  CBG: No results for input(s): GLUCAP in  the last 168 hours.  Microbiology: Results for orders placed or performed during the hospital encounter of 04/01/21  Resp Panel by RT-PCR (Flu A&B, Covid) Nasopharyngeal Swab     Status: None   Collection Time: 04/01/21 11:28 AM   Specimen: Nasopharyngeal Swab; Nasopharyngeal(NP) swabs in vial transport medium  Result Value Ref Range Status   SARS Coronavirus 2 by RT PCR NEGATIVE NEGATIVE Final    Comment: (NOTE) SARS-CoV-2 target nucleic acids are NOT DETECTED.  The SARS-CoV-2 RNA is generally detectable in upper respiratory specimens during the acute phase of infection. The  lowest concentration of SARS-CoV-2 viral copies this assay can detect is 138 copies/mL. A negative result does not preclude SARS-Cov-2 infection and should not be used as the sole basis for treatment or other patient management decisions. A negative result may occur with  improper specimen collection/handling, submission of specimen other than nasopharyngeal swab, presence of viral mutation(s) within the areas targeted by this assay, and inadequate number of viral copies(<138 copies/mL). A negative result must be combined with clinical observations, patient history, and epidemiological information. The expected result is Negative.  Fact Sheet for Patients:  EntrepreneurPulse.com.au  Fact Sheet for Healthcare Providers:  IncredibleEmployment.be  This test is no t yet approved or cleared by the [place] FDA and  has been authorized for detection and/or diagnosis of SARS-CoV-2 by FDA under an Emergency Use Authorization (EUA). This EUA will remain  in effect (meaning this test can be used) for the duration of the COVID-19 declaration under Section 564(b)(1) of the Act, 21 U.S.C.section 360bbb-3(b)(1), unless the authorization is terminated  or revoked sooner.       Influenza A by PCR NEGATIVE NEGATIVE Final   Influenza B by PCR NEGATIVE NEGATIVE Final    Comment: (NOTE) The Xpert Xpress SARS-CoV-2/FLU/RSV plus assay is intended as an aid in the diagnosis of influenza from Nasopharyngeal swab specimens and should not be used as a sole basis for treatment. Nasal washings and aspirates are unacceptable for Xpert Xpress SARS-CoV-2/FLU/RSV testing.  Fact Sheet for Patients: EntrepreneurPulse.com.au  Fact Sheet for Healthcare Providers: IncredibleEmployment.be  This test is not yet approved or cleared by the [place] FDA and has been authorized for detection and/or diagnosis of SARS-CoV-2 by FDA under  an Emergency Use Authorization (EUA). This EUA will remain in effect (meaning this test can be used) for the duration of the COVID-19 declaration under Section 564(b)(1) of the Act, 21 U.S.C. section 360bbb-3(b)(1), unless the authorization is terminated or revoked.  Performed at [facility], [address] [place], [place] [address]     Coagulation Studies: No results for input(s): LABPROT, INR in the last 72 hours.  Urinalysis: No results for input(s): COLORURINE, LABSPEC, PHURINE, GLUCOSEU, HGBUR, BILIRUBINUR, KETONESUR, PROTEINUR, UROBILINOGEN, NITRITE, LEUKOCYTESUR in the last 72 hours.  Invalid input(s): APPERANCEUR    Imaging: DG Chest 2 View  Result Date: 04/01/2021 CLINICAL DATA:  Shortness of breath EXAM: CHEST - 2 VIEW COMPARISON:  Radiograph 11/28/2020 FINDINGS: Unchanged cardiomediastinal silhouette. Mild interstitial opacities. There are new small bilateral pleural effusions, left greater than right with bibasilar atelectasis. No pneumothorax. No acute osseous abnormality. IMPRESSION: Mild pulmonary edema with small bilateral pleural effusions and adjacent basilar atelectasis Electronically Signed   By: Maurine Simmering M.D.   On: 04/01/2021 11:04     Medications:    sodium chloride     dialysis solution 1.5% low-MG/low-CA     furosemide (LASIX) 200 mg in dextrose 5% 100 mL (2mg /mL) infusion 8  mg/hr (04/02/21 1156)    amLODipine  10 mg Oral Daily   apixaban  5 mg Oral BID   bisoprolol  2.5 mg Oral Once   [START ON 04/03/2021] bisoprolol  5 mg Oral Daily   calcitRIOL  0.25 mcg Oral Q M,W,F   [START ON 04/03/2021] cloNIDine  0.1 mg Transdermal Q Fri   doxazosin  8 mg Oral Daily   furosemide  80 mg Intravenous Q12H   gentamicin ointment   Topical Daily   isosorbide mononitrate  30 mg Oral Daily   multivitamin  1 tablet Oral QHS   nicotine  21 mg Transdermal Daily   rosuvastatin  20 mg Oral QHS   sevelamer carbonate  800 mg Oral TID WC   sodium chloride  flush  3 mL Intravenous Q12H   sodium chloride, acetaminophen **OR** acetaminophen, fluticasone, heparin, hydrALAZINE, ondansetron **OR** ondansetron (ZOFRAN) IV, sodium chloride flush, tiZANidine  Assessment/ Plan:  [name] is a 75 y.o.  male with a past medical history of chronic combined systolic and diastolic heart failure with reported EF of 45 to 50%, hypertension, CAD, PAD, sleep apnea, renal cell carcinoma, and end-stage renal disease on peritoneal dialysis. Pateint admitted for Acute pulmonary edema (HCC) [J81.0] Hypoxia [R09.02] Acute on chronic combined systolic (congestive) and diastolic (congestive) heart failure (HCC) [I50.43]   End stage renal disease with fluid overload on peritoneal dialysis. Will continue nightly treatments. Unsuccessful treatment overnight. Will re-attempt tonight with 2.5% dialysate to optimize fluid removal.  Will prescribe furosemide drip at 8 mg/h.  2.Anemia of chronic kidney disease Lab Results  Component Value Date   HGB 9.1 (L) 04/02/2021   Hgb at goal  3. Secondary Hyperparathyroidism: with outpatient labs: PTH 1020, phosphorus 6.4, calcium 8.5 on 03/24/21.   Lab Results  Component Value Date   PTH 315 (H) 05/11/2017   CALCIUM 8.3 (L) 04/02/2021   PHOS 6.2 (H) 04/02/2021   Calcium and phosphorus not at goal.  Lorin Picket, calcitriol, cholecalciferol ordered outpatient.  Sevelamer with meals  4. Hypertension with chronic kidney disease.  Home regimen includes amlodipine, Cardura, clonidine, and torsemide.  Currently prescribed isosorbide also.  Torsemide held.    LOS: 1 Sabri Teal 11/10/20221:29 PM

## 2021-04-02 NOTE — Progress Notes (Signed)
Progress Note  Patient Name: [name] Date of Encounter: 04/02/2021  Primary Cardiologist: [name], MD  Subjective   Feeling better - close to baseline.  Converted to sinus rhythm last night @ 21:38.  No chest pain or dyspnea @ rest.  Still on O2 via [place].  Inpatient Medications    Scheduled Meds:  apixaban  5 mg Oral BID   bisoprolol  2.5 mg Oral Daily   calcitRIOL  0.25 mcg Oral Q M,W,F   [START ON 04/03/2021] cloNIDine  0.1 mg Transdermal Q Fri   doxazosin  8 mg Oral Daily   furosemide  80 mg Intravenous Q12H   gentamicin ointment   Topical Daily   isosorbide mononitrate  30 mg Oral Daily   multivitamin  1 tablet Oral QHS   nicotine  21 mg Transdermal Daily   rosuvastatin  20 mg Oral QHS   sevelamer carbonate  800 mg Oral TID WC   sodium chloride flush  3 mL Intravenous Q12H   Continuous Infusions:  sodium chloride     dialysis solution 1.5% low-MG/low-CA     PRN Meds: sodium chloride, acetaminophen **OR** acetaminophen, fluticasone, heparin, hydrALAZINE, ondansetron **OR** ondansetron (ZOFRAN) IV, sodium chloride flush, tiZANidine   Vital Signs    Vitals:   04/01/21 2338 04/02/21 0406 04/02/21 0557 04/02/21 0718  BP: (!) 151/69 (!) 175/74  (!) 154/57  Pulse: 76 71  73  Resp: 20 18  19   Temp: (!) 97.5 F (36.4 C) 98.6 F (37 C)  98.3 F (36.8 C)  TempSrc:      SpO2: 99% 100%  98%  Weight:   66.4 kg   Height:        Intake/Output Summary (Last 24 hours) at 04/02/2021 0909 Last data filed at 04/02/2021 0413 Gross per 24 hour  Intake 614.88 ml  Output 1850 ml  Net -1235.12 ml   Filed Weights   04/01/21 1036 04/01/21 1637 04/02/21 0557  Weight: 65.8 kg 65.8 kg 66.4 kg    Physical Exam   GEN: Thing, frail, in no acute distress.  HEENT: Grossly normal.  Neck: Supple, no JVD, carotid bruits, or masses. Cardiac: RRR, no murmurs, rubs, or gallops. No clubbing, cyanosis, edema.  Radials 2+, DP/PT 1+ and equal bilaterally.  Respiratory:   Respirations regular and unlabored, scattered rhonchi. GI: Soft, nontender, nondistended, BS + x 4. MS: no deformity or atrophy. Skin: warm and dry, no rash. Neuro:  Strength and sensation are intact. Psych: AAOx3.  Normal affect.  Labs    Chemistry Recent Labs  Lab 04/01/21 1128 04/02/21 0314  NA 143 137  K 4.4 3.9  CL 110 108  CO2 23 20*  GLUCOSE 141* 307*  BUN 58* 65*  CREATININE 7.51* 8.11*  CALCIUM 9.0 8.3*  GFRNONAA 7* 6*  ANIONGAP 10 9     Hematology Recent Labs  Lab 04/01/21 1128 04/02/21 0314  WBC 10.5 8.8  RBC 3.56* 3.05*  HGB 10.7* 9.1*  HCT 32.5* 27.9*  MCV 91.3 91.5  MCH 30.1 29.8  MCHC 32.9 32.6  RDW 14.5 14.6  PLT 149* 159    Cardiac Enzymes  Recent Labs  Lab 04/01/21 1128 04/01/21 1311  TROPONINIHS 20* 20*     Lipids  Lab Results  Component Value Date   CHOL 122 05/12/2020   HDL 25 (L) 05/12/2020   LDLCALC 61 05/12/2020   LDLDIRECT 134.0 05/11/2017   TRIG 180 (H) 05/12/2020   CHOLHDL 4.9 05/12/2020    HbA1c  Lab Results  Component Value Date   HGBA1C 6.9 (H) 11/21/2020    Radiology    DG Chest 2 View  Result Date: 04/01/2021 CLINICAL DATA:  Shortness of breath EXAM: CHEST - 2 VIEW COMPARISON:  Radiograph 11/28/2020 FINDINGS: Unchanged cardiomediastinal silhouette. Mild interstitial opacities. There are new small bilateral pleural effusions, left greater than right with bibasilar atelectasis. No pneumothorax. No acute osseous abnormality. IMPRESSION: Mild pulmonary edema with small bilateral pleural effusions and adjacent basilar atelectasis Electronically Signed   By: Maurine Simmering M.D.   On: 04/01/2021 11:04    Telemetry    Afib converting to sinus rhythm on 11/9 @ 21:38 following a 2.3 sec pause.  Has since maintained sinus w/ rates in the 60's to 70's - Personally Reviewed  Cardiac Studies   2D Echocardiogram 9.6.2022  1. Left ventricular ejection fraction, by estimation, is 45 to 50%. The  left ventricle has mildly  decreased function. The left ventricle  demonstrates global hypokinesis. The left ventricular internal cavity size  was mildly to moderately dilated. There  is moderate left ventricular hypertrophy. Left ventricular diastolic  parameters are consistent with Grade I diastolic dysfunction (impaired  relaxation). Elevated left atrial pressure. The average left ventricular  global longitudinal strain is -12.4 %.  The global longitudinal strain is abnormal.   2. Right ventricular systolic function is normal. The right ventricular  size is normal. There is normal pulmonary artery systolic pressure.   3. Left atrial size was mild to moderately dilated.   4. The mitral valve is abnormal. Mild mitral valve regurgitation. No  evidence of mitral stenosis.   5. The aortic valve is tricuspid. There is mild calcification of the  aortic valve. There is mild thickening of the aortic valve. Aortic valve  regurgitation is mild. Mild to moderate aortic valve  sclerosis/calcification is present, without any evidence  of aortic stenosis.   6. There is mild dilatation of the ascending aorta, measuring 39 mm.   7. The inferior vena cava is normal in size with greater than 50%  respiratory variability, suggesting right atrial pressure of 3 mmHg.   Patient Profile     75 y.o. male w/ a h/o COPD, PAF, cardiomyopathy (? ICM vs NICM), HTN, HL, PAD w/ chronic dissection of L subclavian artery followed by vasc surgery, OSA, renal cell carcinoma s/p L heminephrectomy, tob abuse, CVA, GIB, ESRD on PD, and adenocarcinoma of the appendix, who was admitted 11/9 w/ resp failure, CHF, and afib w/ RVR.  Afib broke to sinus rhythm 11/9 @ 21:38.  Assessment & Plan    1.  Paroxysmal Afib w/ RVR:  Presented w/ dyspnea and rapid afib. Converted last night @ 21:38.  Insignificant 2.3 second post-termination pause.  Currently maintaining sinus rhythm.  IV dilt off.  Will increase bisoprolol to 5mg  daily.  Of note, he had been on 5mg   daily until recently, when the dose was reduced due to sinus bradycardia and fatigue.  He has been evaluated by EP and is currently wearing a Zio XT (data not available given lack of live telemetry).  He isn't certain when he started noticing recurrent afib.  H/H down slightly - has been on eliquis since 11/2 and has not noted any melena/brbpr.  For now, cont ? blocker and eliquis.  He has f/u w/ EP on 12/14 to discuss options for mgmt of afib going forward.    2.  Acute on chronic HFmrEF/Ischemic vs nonischemic cardiomyopathy:  EF 45-50% in September 2022.  Prior abnl stress test however further ischemic eval deferred in setting of GIB in June.  Admitted 11/9 w/ worsening dyspnea and AF w/ RVR.  Pulmonary edema noted on cxr.  As above, he has converted to sinus rhythm on IV dilt, which has been d/c'd - adjusting oral ? blocker.  Breathing much improved.  Wt w/in 1 lb of his previous dry wt.  Minus 1.2L since admission.  Coarse breath sounds, though does not otw appear significantly volume overloaded.  Suspect that he'd benefit from another day of IV lasix.  Nephrology following.  3.  Essential HTN: Pressure chronically elevated on multiple medications.  Home dose of amlodipine was held while he was on IV diltiazem.  Will resume amlodipine 10 mg daily.  Additionally, continue diuresis.  Could consider further titration of isosorbide if necessary going forward.  4.  ESRD on PD (@ home): Nephrology following.  Currently on intravenous Lasix.  5.  Normocytic anemia: H&H down slightly though overall stable.  Follow.  Signed, Pawley Hodgkins, NP  04/02/2021, 9:09 AM    For questions or updates, please contact   Please consult [email or web address] for contact info under Cardiology/STEMI.

## 2021-04-02 NOTE — Progress Notes (Signed)
[employer] information systems officer - no charge  Received nursing communication via [employer]. Patient's cardizem gtt has been discontinued due to HR in the 50s-60s.  Paroxysmal atrial fibrillation with RVR - improved - cardizem gtt order has been discontinued   Dr. Tobie Poet

## 2021-04-02 NOTE — Consult Note (Addendum)
   Heart Failure Nurse Navigator Note  He presented to the emergency room with complaints of worsening shortness of breath  HFmrEF 45-50%.  Moderate LVH. Grade 1 diastolic dysfunction.  Abnormal longitudinal strain. normal right ventricular systolic function.  Mild to moderate left atrial enlargement.  Mild mitral regurgitation.  Echocardiogram performed in September 2022.  Comorbidities:  End-stage renal disease on peritoneal dialysis Hypertension Peripheral arterial disease Sleep apnea CVA Tobacco abuse  Labs:  Sodium 137, potassium 3.9, chloride 108, CO2 20, BUN 65, creatinine 8.11 Weight is 66.4 kg Blood pressure 184/78 Intake 614 mL Output 1850 mL  Medications:   Amlodipine 10 mg daily Apixaban to 5 mg 2 times a day Bisoprolol 2.5 mg daily Cardura 8 mg daily Lasix infusion at 8 mg an hour Isosorbide mononitrate 30 mg daily NicoDerm patch  rosuvastatin 20 mg daily   Initial meeting with patient today he was awake sitting up on the edge of the bed in no acute distress.  He states at home he weighs himself daily, checks his blood pressure and records.  He continues to use a little salt at the table and states that his wife cooks with salt but not heavily.  Discussed the types of foods that he likes to eat, he he states that he could live on fresh fruits and vegetables.  Recommended removing the saltshaker from the table.  He states that his wife likes to eat at restaurants that he knows of that is higher in sodium and not good for him.  Discussed eating food from restaurants, use the samples and teaching booklet.  Discussed weighing daily and what to report to physician.  Also told him that once he is established in the outpatient heart failure clinic that [facility] would be a good resource if he was questioning his symptoms or felt that he was getting into trouble with weight gain or changes in his symptoms 6 etc.  Also discussed limiting his fluid intake to less  than 64 ounces or if physician deem his restrictions to be something less than that.  He voices understanding.   He continues to smoke, when he is busy he might smoke 5 cigarettes daily but when he is inactive he smokes a pack.  He used to smoke 2 packs a day.   Was given the living with heart failure handbook, along with low-sodium handout and the zone magnet.  He has a appointment in the outpatient heart failure clinic on November 18 at 2 PM.  He has a 4% rate of no-shows, 4 out of 107 appointments.  Pricilla Riffle RN [facility]FN

## 2021-04-02 NOTE — Progress Notes (Signed)
   04/01/21 1637  Assess: MEWS Score  Temp 98.2 F (36.8 C)  BP (!) 208/80  Pulse Rate 78  Resp 19  SpO2 95 %  O2 Device Nasal Cannula  O2 Flow Rate (L/min) 2 L/min  Assess: MEWS Score  MEWS Temp 0  MEWS Systolic 2  MEWS Pulse 0  MEWS RR 0  MEWS LOC 0  MEWS Score 2  MEWS Score Color Yellow  Assess: if the MEWS score is Yellow or Red  Were vital signs taken at a resting state? Yes  Focused Assessment No change from prior assessment  Does the patient meet 2 or more of the SIRS criteria? No  MEWS guidelines implemented *See Row Information* Yes  Treat  MEWS Interventions Administered scheduled meds/treatments (Cardizem gtt started)  Pain Scale 0-10  Pain Score 0  Take Vital Signs  Increase Vital Sign Frequency  Yellow: Q 2hr X 2 then Q 4hr X 2, if remains yellow, continue Q 4hrs  Escalate  MEWS: Escalate Yellow: discuss with charge nurse/RN and consider discussing with provider and RRT  Notify: Charge Nurse/RN  Name of Charge Nurse/RN Notified Colletta Maryland RN  Date Charge Nurse/RN Notified 04/01/21  Time Charge Nurse/RN Notified 1645  Notify: Provider  Provider Name/Title Ag[facility]ata  Date Provider Notified 04/01/21  Time Provider Notified 1645  Notification Type Page  Notification Reason Other (Comment) (Med intervention neeeded)  Provider response See new orders  Date of Provider Response 04/01/21  Time of Provider Response 1648  Assess: SIRS CRITERIA  SIRS Temperature  0  SIRS Pulse 0  SIRS Respirations  0  SIRS WBC 0  SIRS Score Sum  0

## 2021-04-02 NOTE — Progress Notes (Signed)
Inpatient Diabetes Program Recommendations  AACE/ADA: New Consensus Statement on Inpatient Glycemic Control (2015)  Target Ranges:  Prepandial:   less than 140 mg/dL      Peak postprandial:   less than 180 mg/dL (1-2 hours)      Critically ill patients:  140 - 180 mg/dL   Lab Results  Component Value Date   GLUCAP 166 (H) 11/30/2020   HGBA1C 6.9 (H) 11/21/2020    Review of Glycemic Control  Diabetes history: type 2 Outpatient Diabetes medications: none Current orders for Inpatient glycemic control: none  Inpatient Diabetes Program Recommendations:   Noted that patient has history of diabetes. Recommend checking blood sugars TID & HS while in the hospital. Recommend starting Novolog SENSITIVE correction scale TID.  Harvel Ricks RN BSN CDE Diabetes Coordinator Pager: [phone]  8am-5pm

## 2021-04-03 ENCOUNTER — Other Ambulatory Visit: Payer: Self-pay

## 2021-04-03 DIAGNOSIS — I25118 Atherosclerotic heart disease of native coronary artery with other forms of angina pectoris: Secondary | ICD-10-CM | POA: Diagnosis not present

## 2021-04-03 DIAGNOSIS — J810 Acute pulmonary edema: Secondary | ICD-10-CM | POA: Diagnosis not present

## 2021-04-03 DIAGNOSIS — I5043 Acute on chronic combined systolic (congestive) and diastolic (congestive) heart failure: Secondary | ICD-10-CM | POA: Diagnosis not present

## 2021-04-03 DIAGNOSIS — R0902 Hypoxemia: Secondary | ICD-10-CM | POA: Diagnosis not present

## 2021-04-03 LAB — BASIC METABOLIC PANEL
Anion gap: 11 (ref 5–15)
BUN: 69 mg/dL — ABNORMAL HIGH (ref 8–23)
CO2: 22 mmol/L (ref 22–32)
Calcium: 8.5 mg/dL — ABNORMAL LOW (ref 8.9–10.3)
Chloride: 108 mmol/L (ref 98–111)
Creatinine, Ser: 7.98 mg/dL — ABNORMAL HIGH (ref 0.61–1.24)
GFR, Estimated: 6 mL/min — ABNORMAL LOW (ref >60)
Glucose, Bld: 156 mg/dL — ABNORMAL HIGH (ref 70–99)
Potassium: 4.1 mmol/L (ref 3.5–5.1)
Sodium: 141 mmol/L (ref 135–145)

## 2021-04-03 LAB — CBC
HCT: 27.5 % — ABNORMAL LOW (ref 39.0–52.0)
Hemoglobin: 9 g/dL — ABNORMAL LOW (ref 13.0–17.0)
MCH: 30.3 pg (ref 26.0–34.0)
MCHC: 32.7 g/dL (ref 30.0–36.0)
MCV: 92.6 fL (ref 80.0–100.0)
Platelets: 150 10*3/uL (ref 150–400)
RBC: 2.97 MIL/uL — ABNORMAL LOW (ref 4.22–5.81)
RDW: 14.6 % (ref 11.5–15.5)
WBC: 14.5 10*3/uL — ABNORMAL HIGH (ref 4.0–10.5)
nRBC: 0 % (ref 0.0–0.2)

## 2021-04-03 LAB — MAGNESIUM: Magnesium: 2.7 mg/dL — ABNORMAL HIGH (ref 1.7–2.4)

## 2021-04-03 LAB — PHOSPHORUS: Phosphorus: 6.6 mg/dL — ABNORMAL HIGH (ref 2.5–4.6)

## 2021-04-03 MED ORDER — ISOSORBIDE MONONITRATE ER 60 MG PO TB24
60.0000 mg | ORAL_TABLET | Freq: Every day | ORAL | Status: DC
  Administered 2021-04-03: 60 mg via ORAL
  Filled 2021-04-03: qty 1

## 2021-04-03 MED ORDER — HYDRALAZINE HCL 50 MG PO TABS
50.0000 mg | ORAL_TABLET | Freq: Three times a day (TID) | ORAL | Status: DC | PRN
  Filled 2021-04-03: qty 1

## 2021-04-03 MED ORDER — AMIODARONE HCL 200 MG PO TABS
400.0000 mg | ORAL_TABLET | Freq: Two times a day (BID) | ORAL | Status: DC
  Administered 2021-04-03 – 2021-04-04 (×3): 400 mg via ORAL
  Filled 2021-04-03 (×2): qty 2

## 2021-04-03 MED ORDER — CLONIDINE HCL 0.1 MG/24HR TD PTWK: 0.1000 mg | MEDICATED_PATCH | TRANSDERMAL | Status: DC

## 2021-04-03 NOTE — Progress Notes (Addendum)
Progress Note  Patient Name: [name] Date of Encounter: 04/03/2021  [facility] Cardiologist: [name], MD   Subjective   Bisoprolol up to 5 mg yesterday Minimal atrial fibrillation overnight, 30 seconds only otherwise maintaining normal sinus rhythm Reports feeling better, breathing stable, coughing up little chronic sputum, reports having chronic bronchitis Long discussion concerning management of his atrial fibrillation  Med reconciliation list not accurate, medications missing Appears he is on torsemide 100 daily among other medications not on the list Has high fluid intake  Amlodipine 10 daily added yesterday, takes this as an outpatient Appears Imdur has been increased up to 60 daily to start tonight Pressures consistently 332 systolic  Inpatient Medications    Scheduled Meds:  amLODipine  10 mg Oral Daily   apixaban  5 mg Oral BID   bisoprolol  5 mg Oral Daily   calcitRIOL  0.25 mcg Oral Q M,W,F   cloNIDine  0.1 mg Transdermal Q Fri   doxazosin  8 mg Oral Daily   gentamicin ointment   Topical Daily   isosorbide mononitrate  60 mg Oral Daily   multivitamin  1 tablet Oral QHS   nicotine  21 mg Transdermal Daily   rosuvastatin  20 mg Oral QHS   sevelamer carbonate  800 mg Oral TID WC   sodium chloride flush  3 mL Intravenous Q12H   Continuous Infusions:  sodium chloride     dialysis solution 1.5% low-MG/low-CA     dialysis solution 2.5% low-MG/low-CA     furosemide (LASIX) 200 mg in dextrose 5% 100 mL (2mg /mL) infusion 8 mg/hr (04/02/21 1156)   PRN Meds: sodium chloride, acetaminophen **OR** acetaminophen, fluticasone, heparin, hydrALAZINE, hydrALAZINE, ondansetron **OR** ondansetron (ZOFRAN) IV, sodium chloride flush, tiZANidine   Vital Signs    Vitals:   04/02/21 2350 04/03/21 0403 04/03/21 0619 04/03/21 0729  BP: (!) 177/63 (!) 162/52  (!) 171/74  Pulse: 66 62  60  Resp: 20 18  17   Temp: 98.3 F (36.8 C) 98.1 F (36.7 C)  98 F  (36.7 C)  TempSrc: Oral Oral    SpO2: 98% 99%  100%  Weight:   67.4 kg   Height:        Intake/Output Summary (Last 24 hours) at 04/03/2021 0921 Last data filed at 04/03/2021 0731 Gross per 24 hour  Intake 1844 ml  Output 2160 ml  Net -316 ml   Last 3 Weights 04/03/2021 04/02/2021 04/01/2021  Weight (lbs) 148 lb 9.6 oz 146 lb 4.8 oz 145 lb 1.6 oz  Weight (kg) 67.405 kg 66.361 kg 65.817 kg      Telemetry    Normal sinus rhythm- Personally Reviewed  ECG    - Personally Reviewed  Physical Exam   GEN: No acute distress.   Neck:  JVD 8+ Cardiac: RRR, no murmurs, rubs, or gallops.  Respiratory: Coarse breath sounds, scattered Rales GI: Soft, nontender, non-distended  MS: No edema; No deformity. Neuro:  Nonfocal  Psych: Normal affect   Labs    High Sensitivity Troponin:   Recent Labs  Lab 04/01/21 1128 04/01/21 1311  TROPONINIHS 20* 20*     Chemistry Recent Labs  Lab 04/01/21 1128 04/02/21 0314 04/02/21 0956 04/03/21 0410  NA 143 137  --  141  K 4.4 3.9  --  4.1  CL 110 108  --  108  CO2 23 20*  --  22  GLUCOSE 141* 307*  --  156*  BUN 58* 65*  --  69*  CREATININE 7.51* 8.11*  --  7.98*  CALCIUM 9.0 8.3*  --  8.5*  MG  --   --  2.5* 2.7*  GFRNONAA 7* 6*  --  6*  ANIONGAP 10 9  --  11    Lipids No results for input(s): CHOL, TRIG, HDL, LABVLDL, LDLCALC, CHOLHDL in the last 168 hours.  Hematology Recent Labs  Lab 04/01/21 1128 04/02/21 0314 04/03/21 0410  WBC 10.5 8.8 14.5*  RBC 3.56* 3.05* 2.97*  HGB 10.7* 9.1* 9.0*  HCT 32.5* 27.9* 27.5*  MCV 91.3 91.5 92.6  MCH 30.1 29.8 30.3  MCHC 32.9 32.6 32.7  RDW 14.5 14.6 14.6  PLT 149* 159 150   Thyroid  Recent Labs  Lab 04/02/21 0956  TSH 1.058    BNPNo results for input(s): BNP, PROBNP in the last 168 hours.  DDimer No results for input(s): DDIMER in the last 168 hours.   Radiology    DG Chest 2 View  Result Date: 04/01/2021 CLINICAL DATA:  Shortness of breath EXAM: CHEST - 2 VIEW  COMPARISON:  Radiograph 11/28/2020 FINDINGS: Unchanged cardiomediastinal silhouette. Mild interstitial opacities. There are new small bilateral pleural effusions, left greater than right with bibasilar atelectasis. No pneumothorax. No acute osseous abnormality. IMPRESSION: Mild pulmonary edema with small bilateral pleural effusions and adjacent basilar atelectasis Electronically Signed   By: Maurine Simmering M.D.   On: 04/01/2021 11:04    Cardiac Studies     Patient Profile     75 y.o. male w/ a h/o COPD, PAF, cardiomyopathy (? ICM vs NICM), HTN, HL, PAD w/ chronic dissection of L subclavian artery followed by vasc surgery, OSA, renal cell carcinoma s/p L heminephrectomy, tob abuse, CVA, GIB, ESRD on PD, and adenocarcinoma of the appendix, who was admitted 11/9 w/ resp failure, CHF, and afib w/ RVR.  Afib broke to sinus rhythm 11/9 @ 21:38.  Assessment & Plan    Paroxysmal atrial fibrillation with RVR Likely a major contributor to his presentation, exacerbating his acute on chronic diastolic and systolic CHF, EF 45 to 50%, in the setting of underlying COPD --Narrow therapeutic window, especially in light of his end-stage renal disease on peritoneal dialysis -He is maintaining normal sinus rhythm 36 hours --Continue bisoprolol 5 daily, we have recommended extra 5 for atrial fibrillation spell -Currently has a ZIO monitor in place and has follow-up with the EP -May need antiarrhythmic medication for recurrent spells -Continue anticoagulation 5 twice daily, close monitoring of hemoglobin as outpatient   Acute on chronic diastolic and systolic CHF Ejection fraction 45% Prior abnormal stress test, has not undergone ischemic work-up today -Continue IV Lasix today, tomorrow morning with reassessment   End-stage renal disease on peritoneal dialysis Does not have access for hemodialysis Reports he is responding to IV diuretics -Potentially could continue oral diuretics as outpatient    Hypertension Poorly controlled, amlodipine 10 restarted yesterday was taking this as outpatient -Imdur increased to 60 this evening -Cardura added max dose 8 twice daily -Additional options including increase clonidine up to 0.2 patch -Additional doses of Imdur in the a.m.  Pulmonary edema For shortness of breath spells not associated with atrial fibrillation, will defer to nephrology and peritoneal dialysis regiment Appears he is on torsemide 100 daily -May need metolazone as needed  Anemia In the setting of end-stage renal disease, likely contributing to some of his shortness of breath  Shortness of breath Multifactorial including COPD, diastolic CHF/pulmonary edema, anemia   Total encounter time more than 35  minutes  Greater than 50% was spent in counseling and coordination of care with the patient  For questions or updates, please contact [place] Please consult [email or web address] for contact info under        Signed, Ida Rogue, MD  04/03/2021, 9:21 AM

## 2021-04-03 NOTE — TOC Initial Note (Signed)
Transition of Care [facility]) - Initial/Assessment Note    Patient Details  Name: [name] MRN: [id number] Date of Birth: 1946-04-10  Transition of [facility]) CM/SW Contact:    Magnus Ivan, LCSW Phone Number: 04/03/2021, 9:36 AM  Clinical Narrative:                Completed high risk with patient. Patient lives with wife. Drives himself to appointments. PCP is Dr. Derrel Nip. Pharmacy is [facility]. Patient denies [place] or DME history. Says he is not on home o2. TOC to follow for needs.   Expected Discharge Plan: Home/Self Care Barriers to Discharge: Continued Medical Work up   Patient Goals and CMS Choice Patient states their goals for this hospitalization and ongoing recovery are:: to return home with wife CMS Medicare.gov Compare Post Acute Care list provided to:: Patient Choice offered to / list presented to : Patient  Expected Discharge Plan and Services Expected Discharge Plan: Home/Self Care       Living arrangements for the past 2 months: Single Family Home                                      Prior Living Arrangements/Services Living arrangements for the past 2 months: Single Family Home Lives with:: Spouse Patient language and need for interpreter reviewed:: Yes Do you feel safe going back to the place where you live?: Yes      Need for Family Participation in Patient Care: Yes (Comment) Care giver support system in place?: Yes (comment)   Criminal Activity/Legal Involvement Pertinent to Current Situation/Hospitalization: No - Comment as needed  Activities of Daily Living Home Assistive Devices/Equipment: None ADL Screening (condition at time of admission) Patient's cognitive ability adequate to safely complete daily activities?: Yes Is the patient deaf or have difficulty hearing?: No Does the patient have difficulty seeing, even when wearing glasses/contacts?: No Does the patient have difficulty concentrating, remembering, or making decisions?:  No Patient able to express need for assistance with ADLs?: Yes Does the patient have difficulty dressing or bathing?: No Independently performs ADLs?: Yes (appropriate for developmental age) Does the patient have difficulty walking or climbing stairs?: No Weakness of Legs: None Weakness of Arms/Hands: None  Permission Sought/Granted Permission sought to share information with : Facility Art therapist granted to share information with : Yes, Verbal Permission Granted              Emotional Assessment       Orientation: : Oriented to Self, Oriented to Place, Oriented to  Time, Oriented to Situation Alcohol / Substance Use: Not Applicable Psych Involvement: No (comment)  Admission diagnosis:  Acute pulmonary edema (HCC) [J81.0] Hypoxia [R09.02] Acute on chronic combined systolic (congestive) and diastolic (congestive) heart failure (HCC) [I50.43] Patient Active Problem List   Diagnosis Date Noted   Acute on chronic combined systolic (congestive) and diastolic (congestive) heart failure ([place]) 04/01/2021   Carotid stenosis 02/17/2021   Benign prostatic hyperplasia with lower urinary tract symptoms 02/08/2021   Acquired complex renal cyst 02/08/2021   Atrial fibrillation with rapid ventricular response ([place]) 11/28/2020   COVID-19 virus infection 11/28/2020   Vitamin D deficiency 11/23/2020   Rectal bleeding 11/06/2020   Pneumoperitoneum 11/06/2020   Resistant hypertension 11/06/2020   Diabetes mellitus with ESRD (end-stage renal disease) ([place]) 11/06/2020   CAD (coronary artery disease) 11/06/2020   Acute on chronic anemia 11/06/2020   Anemia  due to blood loss 11/06/2020   Hypotension, iatrogenic 08/20/2020   ESRD on peritoneal dialysis ([place]) 08/06/2020   HFrEF (heart failure with reduced ejection fraction) (HCC)    PAF (paroxysmal atrial fibrillation) ([place])    Anemia of chronic disease    Hypertensive urgency 05/11/2020   Chronic HFrEF (heart failure  with reduced ejection fraction) ([place]) 05/11/2020   Fluid overload 05/11/2020   End stage renal disease ([place]) 02/19/2020   Aortic atherosclerosis ([place]) 01/22/2020   Hospital discharge follow-up 01/22/2020   Neurologic deficit as late effect of ischemic cerebrovascular accident (CVA) 01/15/2020   Dissection of left subclavian artery ([place])    Cerebrovascular accident (CVA) ([place]) 01/14/2020   Constipation 05/13/2019   Cardiomyopathy ([place]) 04/26/2019   Coronary artery disease involving native coronary artery of native heart without angina pectoris 04/26/2019   ESRD (end stage renal disease) ([place]) 04/26/2019   Chronic pain not due to malignancy 04/12/2019   Chronic bilateral low back pain with bilateral sciatica 11/27/2018   Synovial cyst of lumbar spine 11/27/2018   Uncontrolled hypertension 11/14/2018   CKD (chronic kidney disease), stage V ([place]) 11/14/2018   Major depressive disorder with current active episode 07/26/2017   Anemia of chronic kidney failure, stage 4 (severe) ([place]) 07/26/2017   DOE (dyspnea on exertion) 11/25/2016   S/P UVPP (uvulopalatopharyngoplasty) 08/22/2014   Presbyacusis 08/22/2014   Adenocarcinoma, renal cell ([place]) 02/26/2014   Renal cell carcinoma ([place]) 02/26/2014   Malignant neoplasm of kidney excluding renal pelvis (HCC) 02/26/2014   Prostate CA ([place]) 07/30/2013   Prostate cancer ([place]) 07/30/2013   Elevated serum alkaline phosphatase level 07/30/2013   Malignant neoplasm of prostate ([place]) 07/30/2013   Hypertensive renal sclerosis with hypertension 10/18/2012   Renal sclerosis with hypertension 45/62/5638   Complication of diabetes mellitus ([place]) 07/16/2012   PAD (peripheral artery disease) ([place])    Left hip pain 04/20/2012   Erectile dysfunction 04/20/2012   Hip pain 04/20/2012   tobacco abuse    Temporary cerebral vascular dysfunction 01/15/2012   Tobacco abuse counseling 05/06/2011   Counseling on substance use and abuse 05/06/2011   Hyperlipidemia     History of renal cell carcinoma    PCP:  Crecencio Mc, MD Pharmacy:   [facility] [place] [address] Phone: [phone] Fax: [phone]     Social Determinants of Health (SDOH) Interventions    Readmission Risk Interventions Readmission Risk Prevention Plan 04/03/2021 11/29/2020  Transportation Screening Complete Complete  Medication Review (RN Care Manager) Complete Complete  PCP or Specialist appointment within 3-5 days of discharge Complete Complete  HRI or Home Care Consult Complete -  SW Recovery Care/Counseling Consult Complete -  Palliative Care Screening Not Applicable Not Applicable  Skilled Nursing Facility Complete Not Applicable  Some recent data might be hidden

## 2021-04-03 NOTE — Progress Notes (Addendum)
[name] Progress Note  Patient: [name]    VHQ:[id number]  DOA: 04/01/2021     Date of Service: the patient was seen and examined on 04/03/2021  Chief Complaint  Patient presents with   Shortness of Breath   Brief hospital course: [name] is a 75 y.o. male with medical history significant for CAD, chronic combined systolic and diastolic heart failure, last EF 45 to 50% September 2022, ESRD on PD, hypertension, peripheral arterial disease, sleep apnea, renal cell carcinoma status post left heminephrectomy,history of CVA, nicotine dependence who presents to the emergency room for evaluation of worsening shortness of breath from his baseline.  Patient states that he has had difficulty breathing with exertion but one night prior to his admission he developed worsening shortness of breath especially at rest associated with orthopnea.  Patient has a cough productive of clear phlegm and has had chills but denies having any fever. He denies having any chest pain, no nausea, no vomiting, no abdominal pain, no changes in his bowel habits, no urinary symptoms, no headache, no dizziness, no lightheadedness, no palpitations, no diaphoresis, no blurred vision, no focal deficits.   Labs show sodium 143, potassium 4.4, chloride 110, bicarb 23, glucose 141, BUN 58, creatinine 7.51, calcium 9.0, troponin 20, white count 10.5, hemoglobin 10.7, hematocrit 32.5, MCV 91.3, RDW 14.5, platelet count 149 Respiratory viral panel is negative CXR shows mild pulmonary edema with small bilateral pleural effusions and adjacent basilar atelectasis Twelve-lead EKG reviewed by me shows sinus rhythm with occasional PVCs, incomplete right bundle branch block.       ED Course: Patient is a 75 year old male with a history of end-stage renal disease on peritoneal dialysis who presents to the ER for evaluation of worsening shortness of breath from his baseline and is found to have bilateral pleural  effusions. ER patient had room air pulse oximetry of 77% and is currently on 2 L of oxygen with improvement in his pulse oximetry to 97%. Patient still voids and received a dose of lasix in the ER. He will be admitted to the hospital for further evaluation.    Assessment and Plan: Principal Problem:   Acute on chronic combined systolic (congestive) and diastolic (congestive) heart failure (HCC) Active Problems:   Coronary artery disease involving native coronary artery of native heart without angina pectoris   Hypertensive urgency   PAF (paroxysmal atrial fibrillation) (HCC)   ESRD on peritoneal dialysis ([place])   Diabetes mellitus with ESRD (end-stage renal disease) ([place])   CAD (coronary artery disease)         Patient is a 75 year old male with a history of hypertension and end-stage renal disease on peritoneal dialysis who presents for evaluation of worsening shortness of breath and is being admitted to the hospital for acute on chronic combined systolic and diastolic dysfunction CHF.         # Acute on chronic combined systolic and diastolic congestive heart failure Most likely related to hypertensive urgency and hypertensive heart disease Last 2D echocardiogram from September shows an LVEF of 45 to 50%. Patient's blood pressure is uncontrolled with systolic blood pressure in the 200s Optimize blood pressure control S/p Lasix 80 mg IV every 12, started Lasix IV infusion by nephrology Consult nephrology for PD     Acute respiratory failure Patient presents with tachypnea, increased work of breathing and was hypoxic with room air pulse oximetry of 77% He is currently on 2 L of oxygen with improvement in his pulse oximetry  to 97% Acute respiratory failure appears to be secondary to acute CHF exacerbation He will need to be assessed for home oxygen need prior to discharge      Hypertensive urgency Optimize blood pressure control Patient is on antihypertensive medications  which should be resumed extremities hospitalization Continue hydralazine 10 mg IV every 6 hours as needed for systolic blood pressure greater than 139mmHg  11/11 increased Imdur from 30 to 60 mg p.o. nightly and started hydralazine 50 mg p.o. 3 times daily as needed if SBP greater than 150 mmHg.   # ESRD on peritoneal dialysis We will request nephrology consult for peritoneal dialysis while inpatient    # Paroxysmal atrial fibrillation Patient developed A. fib with RVR secondary to volume overload and shortness of breath S/p Cardizem IV infusion Continue bisoprolol for rate control Continue apixaban  Cardiology consulted 11/11 patient had A. fib with RVR for 1 hour and converted back to sinus bradycardia Was notified and started amiodarone 400 twice daily   # Nicotine dependence Smoking cessation was discussed with patient in detail. (About 5 minutes was spent) Will place patient on a nicotine transdermal patch 21 mg daily      # Anemia of end-stage renal disease Hemoglobin at baseline     # History of CVA without residual deficit History of PAD/CAD Continue apixaban and statins    Body mass index is 23.61 kg/m.  Interventions:       Diet: Renal  DVT Prophylaxis: Therapeutic Anticoagulation with Eliquis    Advance goals of care discussion: Full code  Family Communication: family was NOT present at bedside, at the time of interview.  The pt provided permission to discuss medical plan with the family. Opportunity was given to ask question and all questions were answered satisfactorily.   Disposition:  Pt is from Home, admitted with SOB, ordered, A. fib with RVR, still volume overload, on IV Lasix infusion, which precludes a safe discharge. Discharge to home, when stable and cleared by nephrology and cardiology, currently patient is on IV Lasix infusion.  Subjective:  No significant overnight events, in the morning time patient was in A. fib with RVR for 1 hour converted  back to normal sinus rhythm without any chest pain or palpitations, no shortness of breath.  Patient stated that his peritoneal dialysis worked well last night.  No any issues. Due to A. fib with RVR we will continue to monitor today, cardiology started amiodarone, if patient remains in sinus rhythm then we will plan to discharge tomorrow a.m.   Physical Exam: General:  alert oriented to time, place, and person.  Appear in no distress, affect appropriate Eyes: PERRLA ENT: Oral Mucosa Clear, moist  Neck: no JVD,  Cardiovascular: S1 and S2 Present, no Murmur,  Respiratory: good respiratory effort, Bilateral Air entry equal and Decreased, mild Crackles, no wheezes Abdomen: Bowel Sound present, Soft and no tenderness,  Skin: no rashes Extremities: mild Pedal edema, no calf tenderness Neurologic: without any new focal findings Gait not checked due to patient safety concerns  Vitals:   04/03/21 0619 04/03/21 0729 04/03/21 1216 04/03/21 1433  BP:  (!) 171/74 (!) 179/66 (!) 167/67  Pulse:  60 64 (!) 58  Resp:  17 18 18   Temp:  98 F (36.7 C) 98.1 F (36.7 C) 97.6 F (36.4 C)  TempSrc:      SpO2:  100% 99% 99%  Weight: 67.4 kg     Height:        Intake/Output Summary (Last  24 hours) at 04/03/2021 1544 Last data filed at 04/03/2021 1435 Gross per 24 hour  Intake 1964 ml  Output 1835 ml  Net 129 ml   Filed Weights   04/01/21 1637 04/02/21 0557 04/03/21 0619  Weight: 65.8 kg 66.4 kg 67.4 kg    Data Reviewed: I have personally reviewed and interpreted daily labs, tele strips, imagings as discussed above. I reviewed all nursing notes, pharmacy notes, vitals, pertinent old records I have discussed plan of care as described above with RN and patient/family.  CBC: Recent Labs  Lab 04/01/21 1128 04/02/21 0314 04/03/21 0410  WBC 10.5 8.8 14.5*  HGB 10.7* 9.1* 9.0*  HCT 32.5* 27.9* 27.5*  MCV 91.3 91.5 92.6  PLT 149* 159 974   Basic Metabolic Panel: Recent Labs  Lab  04/01/21 1128 04/02/21 0314 04/02/21 0956 04/03/21 0410  NA 143 137  --  141  K 4.4 3.9  --  4.1  CL 110 108  --  108  CO2 23 20*  --  22  GLUCOSE 141* 307*  --  156*  BUN 58* 65*  --  69*  CREATININE 7.51* 8.11*  --  7.98*  CALCIUM 9.0 8.3*  --  8.5*  MG  --   --  2.5* 2.7*  PHOS  --   --  6.2* 6.6*    Studies: No results found.  Scheduled Meds:  amiodarone  400 mg Oral BID   amLODipine  10 mg Oral Daily   apixaban  5 mg Oral BID   bisoprolol  5 mg Oral Daily   calcitRIOL  0.25 mcg Oral Q M,W,F   [START ON 04/06/2021] cloNIDine  0.1 mg Transdermal Weekly   doxazosin  8 mg Oral Daily   gentamicin ointment   Topical Daily   isosorbide mononitrate  60 mg Oral Daily   multivitamin  1 tablet Oral QHS   nicotine  21 mg Transdermal Daily   rosuvastatin  20 mg Oral QHS   sevelamer carbonate  800 mg Oral TID WC   sodium chloride flush  3 mL Intravenous Q12H   Continuous Infusions:  sodium chloride     dialysis solution 1.5% low-MG/low-CA     dialysis solution 2.5% low-MG/low-CA     furosemide (LASIX) 200 mg in dextrose 5% 100 mL (2mg /mL) infusion 8 mg/hr (04/03/21 1122)   PRN Meds: sodium chloride, acetaminophen **OR** acetaminophen, fluticasone, heparin, hydrALAZINE, hydrALAZINE, ondansetron **OR** ondansetron (ZOFRAN) IV, sodium chloride flush, tiZANidine  Time spent: 35 minutes  Author: Val Riles. MD [facility] 04/03/2021 3:44 PM  To reach On-call, see care teams to locate the attending and reach out to them via [email or web address]. If 7PM-7AM, please contact night-coverage If you still have difficulty reaching the attending provider, please page the [facility] (Director on Call) for [name] on amion for assistance.

## 2021-04-03 NOTE — Plan of Care (Signed)

## 2021-04-03 NOTE — Progress Notes (Signed)
[facility]  ROUNDING NOTE   Subjective:   [name] is a 75 year old male with a past medical history of chronic combined systolic and diastolic heart failure with reported EF of 45 to 50%, hypertension, CAD, PAD, sleep apnea, renal cell carcinoma, and end-stage renal disease on peritoneal dialysis.  Patient presents to emergency room with shortness of breath and is admitted for Acute pulmonary edema ([place]) [J81.0] Hypoxia [R09.02] Acute on chronic combined systolic (congestive) and diastolic (congestive) heart failure ([place]) [I50.43]  PD went much better last night. We also started him on Lasix drip which produced 2.1 L of urine. His respiratory status much improved today.   Objective:  Vital signs in last 24 hours:  Temp:  [98 F (36.7 C)-98.5 F (36.9 C)] 98 F (36.7 C) (11/11 0729) Pulse Rate:  [60-74] 60 (11/11 0729) Resp:  [17-20] 17 (11/11 0729) BP: (162-185)/(52-83) 171/74 (11/11 0729) SpO2:  [96 %-100 %] 100 % (11/11 0729) Weight:  [67.4 kg] 67.4 kg (11/11 0619)  Weight change: 1.633 kg Filed Weights   04/01/21 1637 04/02/21 0557 04/03/21 0619  Weight: 65.8 kg 66.4 kg 67.4 kg    Intake/Output: I/O last 3 completed shifts: In: 0093 [P.O.:1680; I.V.:54] Out: 3760 [Urine:3760]   Intake/Output this shift:  Total I/O In: 480 [P.O.:480] Out: -   Physical Exam: General: NAD, sitting up in bed  Head: Normocephalic, atraumatic. Moist oral mucosal membranes  Eyes: Anicteric  Lungs:  Clear to auscultation, normal effort  Heart: Regular rate and rhythm  Abdomen:  Soft, nontender, nondistended  Extremities: trace peripheral edema.  Neurologic: Nonfocal, moving all four extremities  Skin: No lesions  Access: PD catheter    Basic Metabolic Panel: Recent Labs  Lab 04/01/21 1128 04/02/21 0314 04/02/21 0956 04/03/21 0410  NA 143 137  --  141  K 4.4 3.9  --  4.1  CL 110 108  --  108  CO2 23 20*  --  22  GLUCOSE 141* 307*  --  156*  BUN 58*  65*  --  69*  CREATININE 7.51* 8.11*  --  7.98*  CALCIUM 9.0 8.3*  --  8.5*  MG  --   --  2.5* 2.7*  PHOS  --   --  6.2* 6.6*     Liver Function Tests: No results for input(s): AST, ALT, ALKPHOS, BILITOT, PROT, ALBUMIN in the last 168 hours. No results for input(s): LIPASE, AMYLASE in the last 168 hours. No results for input(s): AMMONIA in the last 168 hours.  CBC: Recent Labs  Lab 04/01/21 1128 04/02/21 0314 04/03/21 0410  WBC 10.5 8.8 14.5*  HGB 10.7* 9.1* 9.0*  HCT 32.5* 27.9* 27.5*  MCV 91.3 91.5 92.6  PLT 149* 159 150     Cardiac Enzymes: No results for input(s): CKTOTAL, CKMB, CKMBINDEX, TROPONINI in the last 168 hours.  BNP: Invalid input(s): POCBNP  CBG: Recent Labs  Lab 04/02/21 1652  GLUCAP 170*    Microbiology: Results for orders placed or performed during the hospital encounter of 04/01/21  Resp Panel by RT-PCR (Flu A&B, Covid) Nasopharyngeal Swab     Status: None   Collection Time: 04/01/21 11:28 AM   Specimen: Nasopharyngeal Swab; Nasopharyngeal(NP) swabs in vial transport medium  Result Value Ref Range Status   SARS Coronavirus 2 by RT PCR NEGATIVE NEGATIVE Final    Comment: (NOTE) SARS-CoV-2 target nucleic acids are NOT DETECTED.  The SARS-CoV-2 RNA is generally detectable in upper respiratory specimens during the acute phase of  infection. The lowest concentration of SARS-CoV-2 viral copies this assay can detect is 138 copies/mL. A negative result does not preclude SARS-Cov-2 infection and should not be used as the sole basis for treatment or other patient management decisions. A negative result may occur with  improper specimen collection/handling, submission of specimen other than nasopharyngeal swab, presence of viral mutation(s) within the areas targeted by this assay, and inadequate number of viral copies(<138 copies/mL). A negative result must be combined with clinical observations, patient history, and epidemiological information.  The expected result is Negative.  Fact Sheet for Patients:  EntrepreneurPulse.com.au  Fact Sheet for Healthcare Providers:  IncredibleEmployment.be  This test is no t yet approved or cleared by the [place] FDA and  has been authorized for detection and/or diagnosis of SARS-CoV-2 by FDA under an Emergency Use Authorization (EUA). This EUA will remain  in effect (meaning this test can be used) for the duration of the COVID-19 declaration under Section 564(b)(1) of the Act, 21 U.S.C.section 360bbb-3(b)(1), unless the authorization is terminated  or revoked sooner.       Influenza A by PCR NEGATIVE NEGATIVE Final   Influenza B by PCR NEGATIVE NEGATIVE Final    Comment: (NOTE) The Xpert Xpress SARS-CoV-2/FLU/RSV plus assay is intended as an aid in the diagnosis of influenza from Nasopharyngeal swab specimens and should not be used as a sole basis for treatment. Nasal washings and aspirates are unacceptable for Xpert Xpress SARS-CoV-2/FLU/RSV testing.  Fact Sheet for Patients: EntrepreneurPulse.com.au  Fact Sheet for Healthcare Providers: IncredibleEmployment.be  This test is not yet approved or cleared by the [place] FDA and has been authorized for detection and/or diagnosis of SARS-CoV-2 by FDA under an Emergency Use Authorization (EUA). This EUA will remain in effect (meaning this test can be used) for the duration of the COVID-19 declaration under Section 564(b)(1) of the Act, 21 U.S.C. section 360bbb-3(b)(1), unless the authorization is terminated or revoked.  Performed at [facility], [address] [place], [place] [address]     Coagulation Studies: No results for input(s): LABPROT, INR in the last 72 hours.  Urinalysis: No results for input(s): COLORURINE, LABSPEC, PHURINE, GLUCOSEU, HGBUR, BILIRUBINUR, KETONESUR, PROTEINUR, UROBILINOGEN, NITRITE, LEUKOCYTESUR in the last  72 hours.  Invalid input(s): APPERANCEUR    Imaging: DG Chest 2 View  Result Date: 04/01/2021 CLINICAL DATA:  Shortness of breath EXAM: CHEST - 2 VIEW COMPARISON:  Radiograph 11/28/2020 FINDINGS: Unchanged cardiomediastinal silhouette. Mild interstitial opacities. There are new small bilateral pleural effusions, left greater than right with bibasilar atelectasis. No pneumothorax. No acute osseous abnormality. IMPRESSION: Mild pulmonary edema with small bilateral pleural effusions and adjacent basilar atelectasis Electronically Signed   By: Maurine Simmering M.D.   On: 04/01/2021 11:04     Medications:    sodium chloride     dialysis solution 1.5% low-MG/low-CA     dialysis solution 2.5% low-MG/low-CA     furosemide (LASIX) 200 mg in dextrose 5% 100 mL (2mg /mL) infusion 8 mg/hr (04/02/21 1156)    amLODipine  10 mg Oral Daily   apixaban  5 mg Oral BID   bisoprolol  5 mg Oral Daily   calcitRIOL  0.25 mcg Oral Q M,W,F   cloNIDine  0.1 mg Transdermal Q Fri   doxazosin  8 mg Oral Daily   gentamicin ointment   Topical Daily   isosorbide mononitrate  60 mg Oral Daily   multivitamin  1 tablet Oral QHS   nicotine  21 mg Transdermal Daily  rosuvastatin  20 mg Oral QHS   sevelamer carbonate  800 mg Oral TID WC   sodium chloride flush  3 mL Intravenous Q12H   sodium chloride, acetaminophen **OR** acetaminophen, fluticasone, heparin, hydrALAZINE, hydrALAZINE, ondansetron **OR** ondansetron (ZOFRAN) IV, sodium chloride flush, tiZANidine  Assessment/ Plan:  [name] is a 75 y.o.  male with a past medical history of chronic combined systolic and diastolic heart failure with reported EF of 45 to 50%, hypertension, CAD, PAD, sleep apnea, renal cell carcinoma, and end-stage renal disease on peritoneal dialysis. Pateint admitted for Acute pulmonary edema ([facility]) [J81.0] Hypoxia [R09.02] Acute on chronic combined systolic (congestive) and diastolic (congestive) heart failure ([facility])  [I50.43]   End stage renal disease with fluid overload on peritoneal dialysis.  Patient not fully adherent with outpatient peritoneal dialysis and recommendations per outpatient staff.  Patient did respond nicely to Lasix drip and produced 2.1 L of urine.  Patient was on torsemide as an outpatient.  PT progressed well overnight.  If he does discharge today recommend reinitiation of PD as an outpatient.  Patient counseled on PD adherence.  2.Anemia of chronic kidney disease Lab Results  Component Value Date   HGB 9.0 (L) 04/03/2021  He will continue with Epogen as an outpatient.    3. Secondary Hyperparathyroidism: with outpatient labs: PTH 1020, phosphorus 6.4, calcium 8.5 on 03/24/21.   Lab Results  Component Value Date   PTH 315 (H) 05/11/2017   CALCIUM 8.5 (L) 04/03/2021   PHOS 6.6 (H) 04/03/2021   Calcium and phosphorus not at goal.  Continue calcitriol and Renvela.  4. Hypertension with chronic kidney disease.  Home regimen includes amlodipine, Cardura, clonidine, and torsemide.  Currently prescribed isosorbide also.  Torsemide held, but can be restarted as outpatient.   LOS: 2 Jala Dundon 11/11/202210:47 AM

## 2021-04-04 DIAGNOSIS — E7800 Pure hypercholesterolemia, unspecified: Secondary | ICD-10-CM | POA: Diagnosis not present

## 2021-04-04 DIAGNOSIS — Z85528 Personal history of other malignant neoplasm of kidney: Secondary | ICD-10-CM

## 2021-04-04 DIAGNOSIS — I161 Hypertensive emergency: Secondary | ICD-10-CM

## 2021-04-04 DIAGNOSIS — I2510 Atherosclerotic heart disease of native coronary artery without angina pectoris: Secondary | ICD-10-CM | POA: Diagnosis not present

## 2021-04-04 DIAGNOSIS — N186 End stage renal disease: Secondary | ICD-10-CM | POA: Diagnosis not present

## 2021-04-04 DIAGNOSIS — E1122 Type 2 diabetes mellitus with diabetic chronic kidney disease: Secondary | ICD-10-CM | POA: Diagnosis not present

## 2021-04-04 DIAGNOSIS — I2583 Coronary atherosclerosis due to lipid rich plaque: Secondary | ICD-10-CM

## 2021-04-04 DIAGNOSIS — I5043 Acute on chronic combined systolic (congestive) and diastolic (congestive) heart failure: Secondary | ICD-10-CM | POA: Diagnosis not present

## 2021-04-04 DIAGNOSIS — Z992 Dependence on renal dialysis: Secondary | ICD-10-CM

## 2021-04-04 LAB — CBC
HCT: 27.3 % — ABNORMAL LOW (ref 39.0–52.0)
Hemoglobin: 8.8 g/dL — ABNORMAL LOW (ref 13.0–17.0)
MCH: 29.7 pg (ref 26.0–34.0)
MCHC: 32.2 g/dL (ref 30.0–36.0)
MCV: 92.2 fL (ref 80.0–100.0)
Platelets: 153 10*3/uL (ref 150–400)
RBC: 2.96 MIL/uL — ABNORMAL LOW (ref 4.22–5.81)
RDW: 14.2 % (ref 11.5–15.5)
WBC: 11 10*3/uL — ABNORMAL HIGH (ref 4.0–10.5)
nRBC: 0 % (ref 0.0–0.2)

## 2021-04-04 LAB — BASIC METABOLIC PANEL
Anion gap: 8 (ref 5–15)
BUN: 64 mg/dL — ABNORMAL HIGH (ref 8–23)
CO2: 24 mmol/L (ref 22–32)
Calcium: 8.4 mg/dL — ABNORMAL LOW (ref 8.9–10.3)
Chloride: 108 mmol/L (ref 98–111)
Creatinine, Ser: 8.05 mg/dL — ABNORMAL HIGH (ref 0.61–1.24)
GFR, Estimated: 6 mL/min — ABNORMAL LOW (ref >60)
Glucose, Bld: 140 mg/dL — ABNORMAL HIGH (ref 70–99)
Potassium: 3.8 mmol/L (ref 3.5–5.1)
Sodium: 140 mmol/L (ref 135–145)

## 2021-04-04 LAB — MAGNESIUM: Magnesium: 2.4 mg/dL (ref 1.7–2.4)

## 2021-04-04 LAB — PHOSPHORUS: Phosphorus: 6.3 mg/dL — ABNORMAL HIGH (ref 2.5–4.6)

## 2021-04-04 MED ORDER — BISOPROLOL FUMARATE 5 MG PO TABS: 5.0000 mg | ORAL_TABLET | Freq: Every day | ORAL | 0 refills | Status: DC

## 2021-04-04 MED ORDER — AMIODARONE HCL 200 MG PO TABS: ORAL_TABLET | ORAL | 0 refills | Status: DC

## 2021-04-04 MED ORDER — ISOSORBIDE MONONITRATE ER 60 MG PO TB24: 60.0000 mg | ORAL_TABLET | Freq: Every day | ORAL | 0 refills | Status: DC

## 2021-04-04 NOTE — Discharge Summary (Addendum)
Physician Discharge Summary  [name] KGM:[id number] DOB: 12-25-1945 DOA: 04/01/2021  PCP: Crecencio Mc, MD  Admit date: 04/01/2021 Discharge date: 04/04/2021  Admitted From: Home  Disposition:  Home   Recommendations for Outpatient Follow-up and new medication changes:  Follow up with Dr. Derrel Nip in 7 to 10 days.  Resume peritoneal dialysis as outpatient  Continue amiodarone 400 mg bid for 1 week then reduce to 200 mg bid Increased bisoprolol from 2.5 mg to 5 mg.  Follow up with cardiology as outpatient.   Home Health: no   Equipment/Devices: no    Discharge Condition: stable  CODE STATUS: full  Diet recommendation:  heart healthy   Brief/Interim Summary: [name] was admitted to the hospital with the working diagnosis of acute on chronic diastolic heart failure exacerbation, complicated with acute hypoxemic respiratory failure.   75 year old male past medical history for coronary artery disease, diastolic heart failure, end-stage renal disease on peritoneal dialysis, hypertension, peripheral artery disease, sleep apnea, renal cell carcinoma status post left heminephrectomy, history of CVA and nicotine dependence who presents with dyspnea.   He reported progressive dyspnea on exertion to the point where he started experiencing orthopnea.  Because of worsening symptoms he presented to the emergency department.  On his initial physical examination his oximetry on room air was 77%, his blood pressure was 204/86, heart rate 69, respiratory rate 17, with supplemental oxygen , 2 L per nasal cannula, his oxygen saturation 95%, his lungs had rales bilateral bases, no wheezing, heart S1-S2, present, rhythmic, abdomen was soft nontender, positive lower extremity edema.  Sodium 143, potassium 4.4, chloride 110, bicarb 23, glucose 141, BUN 58, creatinine 7.51, height sensitive troponin 20-20, white cell count 10.5, hemoglobin 10.7, hematocrit 32.5, platelets 149. SARS COVID-19  negative.  Chest radiograph with hilar vascular congestion bilaterally.  Bilateral small pleural effusions.  EKG 65 bpm, normal axis, normal intervals, sinus rhythm, Q-wave V1-V2, poor R wave progression, no significant ST segment or T wave changes.  Patient was placed on aggressive diuresis with furosemide, including continuous furosemide infusion. His antihypertensive agents were adjusted.  He developed atrial fibrillation with rapid ventricular response, required infusion of diltiazem. Patient converted back to sinus rhythm and he was placed on amiodarone.  Acute on chronic diastolic heart failure exacerbation. (Combined with systolic heart failure).  Complicated with acute hypoxemic respiratory failure due to acute cardiogenic pulmonary edema. Hypertensive emergency. (Not urgency, patient did have endorgan damage, hypoxemia) Echocardiography from 01/2021 showed left ventricle ejection fraction 45 to 50%, global hypokinesis.  RV systolic function preserved.  Mild to moderate left atrial dilatation.  Patient was admitted to the progressive care unit, he was aggressively diuresed with intravenous furosemide. Negative fluid balance was achieved with significant improvement of his symptoms.  His oxygenation down discharge is 97% on 2 L per nasal cannula. Oximetry on room air before discharge 97% on room air.   Isosorbide has been added to his blood pressure regimen. At discharge his blood pressure is 176/75.  2.  End-stage renal disease on peritoneal dialysis.  Patient continue peritoneal dialysis during his hospitalization. He responded well to diuresis with intravenous furosemide. At home patient will continue taking torsemide. Follow-up with nephrology as an outpatient.  Continue sevelamer and calcitriol for metabolic bone disease. Anemia of chronic kidney disease: His hemoglobin and hematocrit remained stable (8,8 and 27.3), follow-up with nephrology as outpatient for possible  erythropoietin.  3.  Paroxysmal atrial fibrillation, rapid ventricular response.  Patient was treated with intravenous diltiazem, converted  back to sinus rhythm. Patient will continue taking bisoprolol, and amiodarone has been added. Instructions to decrease amiodarone to 200 mg twice daily after 7 days of loading with 400 mg twice daily Continue anticoagulation with apixaban.  4.  History of CVA.  Continue statin and anticoagulation with apixaban.  5.  Peripheral vascular disease, coronary artery disease, dyslipidemia. Continue blood pressure control and statin therapy.  Discharge Diagnoses:  Principal Problem:   Acute on chronic combined systolic (congestive) and diastolic (congestive) heart failure (HCC) Active Problems:   Hyperlipidemia   History of renal cell carcinoma   Coronary artery disease involving native coronary artery of native heart without angina pectoris   PAF (paroxysmal atrial fibrillation) (HCC)   ESRD on peritoneal dialysis ([place])   Diabetes mellitus with ESRD (end-stage renal disease) ([place])   CAD (coronary artery disease)   Hypertensive emergency    Discharge Instructions   Allergies as of 04/04/2021       Reactions   Irbesartan Other (See Comments)   hyperkalemia   Cymbalta [duloxetine Hcl]    Hydralazine Other (See Comments)   headache   Imdur [isosorbide Nitrate] Other (See Comments)   headache   Atorvastatin Other (See Comments)   Muscle pain   Bystolic [nebivolol Hcl]    Extreme fatigue         Medication List     STOP taking these medications    irbesartan 300 MG tablet Commonly known as: AVAPRO   tiZANidine 2 MG tablet Commonly known as: ZANAFLEX   VITAMIN D3 PO       TAKE these medications    albuterol 108 (90 Base) MCG/ACT inhaler Commonly known as: VENTOLIN HFA Inhale 2 puffs into the lungs every 6 (six) hours as needed for wheezing or shortness of breath.   amiodarone 200 MG tablet Commonly known as: PACERONE Take  2 tablets twice daily for 7 days (until 04/11/21), then reduce to 1 tablet twice daily (starting 04/12/21).   amLODipine 10 MG tablet Commonly known as: NORVASC TAKE 1 TABLET BY MOUTH ONCE DAILY   apixaban 5 MG Tabs tablet Commonly known as: Eliquis Take 1 tablet (5 mg total) by mouth 2 (two) times daily.   Ascorbic Acid 500 MG Chew Chew 1 tablet by mouth daily.   b complex vitamins capsule Take 1 capsule by mouth daily.   bisoprolol 5 MG tablet Commonly known as: ZEBETA Take 1 tablet (5 mg total) by mouth daily. Start taking on: April 05, 2021 What changed: how much to take   calcitRIOL 0.25 MCG capsule Commonly known as: ROCALTROL Take 0.25 mcg by mouth every Monday, Wednesday, and Friday.   cloNIDine 0.1 mg/24hr patch Commonly known as: CATAPRES - Dosed in mg/24 hr Place 1 patch (0.1 mg total) onto the skin every Friday.   diphenhydrAMINE 25 MG tablet Commonly known as: BENADRYL Take 25 mg by mouth every 6 (six) hours as needed for allergies.   doxazosin 8 MG tablet Commonly known as: CARDURA Take 8 mg by mouth daily.   fluticasone 50 MCG/ACT nasal spray Commonly known as: FLONASE Place 2 sprays into both nostrils daily as needed for allergies or rhinitis.   gentamicin cream 0.1 % Commonly known as: GARAMYCIN Apply 1 application topically daily.   isosorbide mononitrate 60 MG 24 hr tablet Commonly known as: IMDUR Take 1 tablet (60 mg total) by mouth daily. What changed:  medication strength how much to take   losartan 100 MG tablet Commonly known as: COZAAR Take 100 mg  by mouth daily.   multivitamin Tabs tablet Take 1 tablet by mouth at bedtime.   rosuvastatin 20 MG tablet Commonly known as: CRESTOR TAKE 1 TABLET BY MOUTH AT BEDTIME   sevelamer 800 MG tablet Commonly known as: RENAGEL Take 800 mg by mouth 3 (three) times daily with meals.   torsemide 100 MG tablet Commonly known as: DEMADEX Take 100 mg by mouth daily.        Allergies   Allergen Reactions   Irbesartan Other (See Comments)    hyperkalemia   Cymbalta [Duloxetine Hcl]    Hydralazine Other (See Comments)    headache   Imdur [Isosorbide Nitrate] Other (See Comments)    headache   Atorvastatin Other (See Comments)    Muscle pain   Bystolic [Nebivolol Hcl]     Extreme fatigue     Consultations: Cardiology Nephrology    Procedures/Studies: DG Chest 2 View  Result Date: 04/01/2021 CLINICAL DATA:  Shortness of breath EXAM: CHEST - 2 VIEW COMPARISON:  Radiograph 11/28/2020 FINDINGS: Unchanged cardiomediastinal silhouette. Mild interstitial opacities. There are new small bilateral pleural effusions, left greater than right with bibasilar atelectasis. No pneumothorax. No acute osseous abnormality. IMPRESSION: Mild pulmonary edema with small bilateral pleural effusions and adjacent basilar atelectasis Electronically Signed   By: Maurine Simmering M.D.   On: 04/01/2021 11:04    Subjective: Patient is feeling better, no nausea or vomiting, no chest pain, dyspnea has improved and no longer orthopnea.   Discharge Exam: Vitals:   04/03/21 2349 04/04/21 0350  BP: (!) 178/69 (!) 164/73  Pulse: (!) 55 (!) 58  Resp: 18 18  Temp: 97.8 F (36.6 C) 97.8 F (36.6 C)  SpO2: 96% 97%   Vitals:   04/03/21 1951 04/03/21 2349 04/04/21 0241 04/04/21 0350  BP: (!) 153/54 (!) 178/69  (!) 164/73  Pulse: (!) 55 (!) 55  (!) 58  Resp: 18 18  18   Temp: 98.3 F (36.8 C) 97.8 F (36.6 C)  97.8 F (36.6 C)  TempSrc: Oral   Oral  SpO2: 99% 96%  97%  Weight:   66 kg   Height:        General: Not in pain or dyspnea  Neurology: Awake and alert, non focal  E ENT: mild pallor, no icterus, oral mucosa moist Cardiovascular: No JVD. S1-S2 present, rhythmic, no gallops, rubs, or murmurs. No lower extremity edema. Pulmonary: positive breath sounds bilaterally, adequate air movement, no wheezing, rhonchi or rales. Gastrointestinal. Abdomen soft and non tender, PD cathter in place.   Skin. No rashes Musculoskeletal: no joint deformities   The results of significant diagnostics from this hospitalization (including imaging, microbiology, ancillary and laboratory) are listed below for reference.     Microbiology: Recent Results (from the past 240 hour(s))  Resp Panel by RT-PCR (Flu A&B, Covid) Nasopharyngeal Swab     Status: None   Collection Time: 04/01/21 11:28 AM   Specimen: Nasopharyngeal Swab; Nasopharyngeal(NP) swabs in vial transport medium  Result Value Ref Range Status   SARS Coronavirus 2 by RT PCR NEGATIVE NEGATIVE Final    Comment: (NOTE) SARS-CoV-2 target nucleic acids are NOT DETECTED.  The SARS-CoV-2 RNA is generally detectable in upper respiratory specimens during the acute phase of infection. The lowest concentration of SARS-CoV-2 viral copies this assay can detect is 138 copies/mL. A negative result does not preclude SARS-Cov-2 infection and should not be used as the sole basis for treatment or other patient management decisions. A negative result may occur with  improper specimen collection/handling, submission of specimen other than nasopharyngeal swab, presence of viral mutation(s) within the areas targeted by this assay, and inadequate number of viral copies(<138 copies/mL). A negative result must be combined with clinical observations, patient history, and epidemiological information. The expected result is Negative.  Fact Sheet for Patients:  EntrepreneurPulse.com.au  Fact Sheet for Healthcare Providers:  IncredibleEmployment.be  This test is no t yet approved or cleared by the [place] FDA and  has been authorized for detection and/or diagnosis of SARS-CoV-2 by FDA under an Emergency Use Authorization (EUA). This EUA will remain  in effect (meaning this test can be used) for the duration of the COVID-19 declaration under Section 564(b)(1) of the Act, 21 U.S.C.section 360bbb-3(b)(1), unless  the authorization is terminated  or revoked sooner.       Influenza A by PCR NEGATIVE NEGATIVE Final   Influenza B by PCR NEGATIVE NEGATIVE Final    Comment: (NOTE) The Xpert Xpress SARS-CoV-2/FLU/RSV plus assay is intended as an aid in the diagnosis of influenza from Nasopharyngeal swab specimens and should not be used as a sole basis for treatment. Nasal washings and aspirates are unacceptable for Xpert Xpress SARS-CoV-2/FLU/RSV testing.  Fact Sheet for Patients: EntrepreneurPulse.com.au  Fact Sheet for Healthcare Providers: IncredibleEmployment.be  This test is not yet approved or cleared by the [place] FDA and has been authorized for detection and/or diagnosis of SARS-CoV-2 by FDA under an Emergency Use Authorization (EUA). This EUA will remain in effect (meaning this test can be used) for the duration of the COVID-19 declaration under Section 564(b)(1) of the Act, 21 U.S.C. section 360bbb-3(b)(1), unless the authorization is terminated or revoked.  Performed at [facility], [address] [place], [place] [address]      Labs: BNP (last 3 results) Recent Labs    05/11/20 1042 08/06/20 1804  BNP 3,057.7* 3,016.0*   Basic Metabolic Panel: Recent Labs  Lab 04/01/21 1128 04/02/21 0314 04/02/21 0956 04/03/21 0410 04/04/21 0428  NA 143 137  --  141 140  K 4.4 3.9  --  4.1 3.8  CL 110 108  --  108 108  CO2 23 20*  --  22 24  GLUCOSE 141* 307*  --  156* 140*  BUN 58* 65*  --  69* 64*  CREATININE 7.51* 8.11*  --  7.98* 8.05*  CALCIUM 9.0 8.3*  --  8.5* 8.4*  MG  --   --  2.5* 2.7* 2.4  PHOS  --   --  6.2* 6.6* 6.3*   Liver Function Tests: No results for input(s): AST, ALT, ALKPHOS, BILITOT, PROT, ALBUMIN in the last 168 hours. No results for input(s): LIPASE, AMYLASE in the last 168 hours. No results for input(s): AMMONIA in the last 168 hours. CBC: Recent Labs  Lab 04/01/21 1128 04/02/21 0314  04/03/21 0410 04/04/21 0428  WBC 10.5 8.8 14.5* 11.0*  HGB 10.7* 9.1* 9.0* 8.8*  HCT 32.5* 27.9* 27.5* 27.3*  MCV 91.3 91.5 92.6 92.2  PLT 149* 159 150 153   Cardiac Enzymes: No results for input(s): CKTOTAL, CKMB, CKMBINDEX, TROPONINI in the last 168 hours. BNP: Invalid input(s): POCBNP CBG: Recent Labs  Lab 04/02/21 1652  GLUCAP 170*   D-Dimer No results for input(s): DDIMER in the last 72 hours. Hgb A1c No results for input(s): HGBA1C in the last 72 hours. Lipid Profile No results for input(s): CHOL, HDL, LDLCALC, TRIG, CHOLHDL, LDLDIRECT in the last 72 hours. Thyroid function studies Recent Labs  04/02/21 0956  TSH 1.058   Anemia work up Recent Labs    04/02/21 0956  VITAMINB12 564  FOLATE 16.2  TIBC 209*  IRON 77   Urinalysis    Component Value Date/Time   COLORURINE STRAW (A) 11/28/2020 1055   APPEARANCEUR CLEAR (A) 11/28/2020 1055   APPEARANCEUR Clear 01/03/2012 1117   LABSPEC 1.008 11/28/2020 1055   LABSPEC 1.004 01/03/2012 1117   PHURINE 7.0 11/28/2020 1055   GLUCOSEU 50 (A) 11/28/2020 1055   GLUCOSEU 100 (A) 01/21/2020 1223   HGBUR NEGATIVE 11/28/2020 [place] 11/28/2020 1055   BILIRUBINUR neg 07/30/2013 1127   BILIRUBINUR Negative 01/03/2012 1117   KETONESUR NEGATIVE 11/28/2020 1055   PROTEINUR 30 (A) 11/28/2020 1055   UROBILINOGEN 0.2 01/21/2020 1223   NITRITE NEGATIVE 11/28/2020 1055   LEUKOCYTESUR NEGATIVE 11/28/2020 1055   LEUKOCYTESUR Negative 01/03/2012 1117   Sepsis Labs Invalid input(s): PROCALCITONIN,  WBC,  LACTICIDVEN Microbiology Recent Results (from the past 240 hour(s))  Resp Panel by RT-PCR (Flu A&B, Covid) Nasopharyngeal Swab     Status: None   Collection Time: 04/01/21 11:28 AM   Specimen: Nasopharyngeal Swab; Nasopharyngeal(NP) swabs in vial transport medium  Result Value Ref Range Status   SARS Coronavirus 2 by RT PCR NEGATIVE NEGATIVE Final    Comment: (NOTE) SARS-CoV-2 target nucleic acids are  NOT DETECTED.  The SARS-CoV-2 RNA is generally detectable in upper respiratory specimens during the acute phase of infection. The lowest concentration of SARS-CoV-2 viral copies this assay can detect is 138 copies/mL. A negative result does not preclude SARS-Cov-2 infection and should not be used as the sole basis for treatment or other patient management decisions. A negative result may occur with  improper specimen collection/handling, submission of specimen other than nasopharyngeal swab, presence of viral mutation(s) within the areas targeted by this assay, and inadequate number of viral copies(<138 copies/mL). A negative result must be combined with clinical observations, patient history, and epidemiological information. The expected result is Negative.  Fact Sheet for Patients:  EntrepreneurPulse.com.au  Fact Sheet for Healthcare Providers:  IncredibleEmployment.be  This test is no t yet approved or cleared by the [place] FDA and  has been authorized for detection and/or diagnosis of SARS-CoV-2 by FDA under an Emergency Use Authorization (EUA). This EUA will remain  in effect (meaning this test can be used) for the duration of the COVID-19 declaration under Section 564(b)(1) of the Act, 21 U.S.C.section 360bbb-3(b)(1), unless the authorization is terminated  or revoked sooner.       Influenza A by PCR NEGATIVE NEGATIVE Final   Influenza B by PCR NEGATIVE NEGATIVE Final    Comment: (NOTE) The Xpert Xpress SARS-CoV-2/FLU/RSV plus assay is intended as an aid in the diagnosis of influenza from Nasopharyngeal swab specimens and should not be used as a sole basis for treatment. Nasal washings and aspirates are unacceptable for Xpert Xpress SARS-CoV-2/FLU/RSV testing.  Fact Sheet for Patients: EntrepreneurPulse.com.au  Fact Sheet for Healthcare Providers: IncredibleEmployment.be  This test is not  yet approved or cleared by the [place] FDA and has been authorized for detection and/or diagnosis of SARS-CoV-2 by FDA under an Emergency Use Authorization (EUA). This EUA will remain in effect (meaning this test can be used) for the duration of the COVID-19 declaration under Section 564(b)(1) of the Act, 21 U.S.C. section 360bbb-3(b)(1), unless the authorization is terminated or revoked.  Performed at [facility], [address], [place], [place] [address]      Time  coordinating discharge: 45 minutes  SIGNED:   Tawni Millers, MD  [name] 04/04/2021, 8:55 AM

## 2021-04-04 NOTE — Progress Notes (Signed)
Progress Note  Patient Name: [name] Date of Encounter: 04/04/2021  Primary Cardiologist: [name], MD  Subjective   Feels well this morning.  Eager to go home.  Brief episode of atrial fibrillation last night, though he was asymptomatic.  Inpatient Medications    Scheduled Meds:  amiodarone  400 mg Oral BID   amLODipine  10 mg Oral Daily   apixaban  5 mg Oral BID   bisoprolol  5 mg Oral Daily   calcitRIOL  0.25 mcg Oral Q M,W,F   [START ON 04/06/2021] cloNIDine  0.1 mg Transdermal Weekly   doxazosin  8 mg Oral Daily   gentamicin ointment   Topical Daily   isosorbide mononitrate  60 mg Oral Daily   multivitamin  1 tablet Oral QHS   nicotine  21 mg Transdermal Daily   rosuvastatin  20 mg Oral QHS   sevelamer carbonate  800 mg Oral TID WC   sodium chloride flush  3 mL Intravenous Q12H   Continuous Infusions:  sodium chloride     dialysis solution 1.5% low-MG/low-CA     dialysis solution 2.5% low-MG/low-CA     furosemide (LASIX) 200 mg in dextrose 5% 100 mL (2mg /mL) infusion 8 mg/hr (04/03/21 1958)   PRN Meds: sodium chloride, acetaminophen **OR** acetaminophen, fluticasone, heparin, hydrALAZINE, hydrALAZINE, ondansetron **OR** ondansetron (ZOFRAN) IV, sodium chloride flush, tiZANidine   Vital Signs    Vitals:   04/03/21 1951 04/03/21 2349 04/04/21 0241 04/04/21 0350  BP: (!) 153/54 (!) 178/69  (!) 164/73  Pulse: (!) 55 (!) 55  (!) 58  Resp: 18 18  18   Temp: 98.3 F (36.8 C) 97.8 F (36.6 C)  97.8 F (36.6 C)  TempSrc: Oral   Oral  SpO2: 99% 96%  97%  Weight:   66 kg   Height:        Intake/Output Summary (Last 24 hours) at 04/04/2021 0822 Last data filed at 04/04/2021 0500 Gross per 24 hour  Intake 1991.74 ml  Output 860 ml  Net 1131.74 ml   Filed Weights   04/02/21 0557 04/03/21 0619 04/04/21 0241  Weight: 66.4 kg 67.4 kg 66 kg    Physical Exam   GEN: Well nourished, well developed, in no acute distress.  HEENT: Grossly normal.   Neck: Supple, no JVD, carotid bruits, or masses. Cardiac: RRR, no murmurs, rubs, or gallops. No clubbing, cyanosis, edema.  Radials 2+, DP/PT 2+ and equal bilaterally.  Respiratory:  Respirations regular and unlabored, clear to auscultation bilaterally. GI: Soft, nontender, nondistended, BS + x 4. MS: no deformity or atrophy. Skin: warm and dry, no rash. Neuro:  Strength and sensation are intact. Psych: AAOx3.  Normal affect.  Labs    Chemistry Recent Labs  Lab 04/02/21 0314 04/03/21 0410 04/04/21 0428  NA 137 141 140  K 3.9 4.1 3.8  CL 108 108 108  CO2 20* 22 24  GLUCOSE 307* 156* 140*  BUN 65* 69* 64*  CREATININE 8.11* 7.98* 8.05*  CALCIUM 8.3* 8.5* 8.4*  GFRNONAA 6* 6* 6*  ANIONGAP 9 11 8      Hematology Recent Labs  Lab 04/02/21 0314 04/03/21 0410 04/04/21 0428  WBC 8.8 14.5* 11.0*  RBC 3.05* 2.97* 2.96*  HGB 9.1* 9.0* 8.8*  HCT 27.9* 27.5* 27.3*  MCV 91.5 92.6 92.2  MCH 29.8 30.3 29.7  MCHC 32.6 32.7 32.2  RDW 14.6 14.6 14.2  PLT 159 150 153    Cardiac Enzymes  Recent Labs  Lab 04/01/21 1128  04/01/21 1311  TROPONINIHS 20* 20*      Lipids  Lab Results  Component Value Date   CHOL 122 05/12/2020   HDL 25 (L) 05/12/2020   LDLCALC 61 05/12/2020   LDLDIRECT 134.0 05/11/2017   TRIG 180 (H) 05/12/2020   CHOLHDL 4.9 05/12/2020    HbA1c  Lab Results  Component Value Date   HGBA1C 6.9 (H) 11/21/2020    Radiology    DG Chest 2 View  Result Date: 04/01/2021 CLINICAL DATA:  Shortness of breath EXAM: CHEST - 2 VIEW COMPARISON:  Radiograph 11/28/2020 FINDINGS: Unchanged cardiomediastinal silhouette. Mild interstitial opacities. There are new small bilateral pleural effusions, left greater than right with bibasilar atelectasis. No pneumothorax. No acute osseous abnormality. IMPRESSION: Mild pulmonary edema with small bilateral pleural effusions and adjacent basilar atelectasis Electronically Signed   By: Maurine Simmering M.D.   On: 04/01/2021 11:04     Telemetry    Predominantly sinus rhythm/sinus bradycardia with PVCs.  He had a run of atrial fibrillation in the low 100s between 1710 and 1942 last night- Personally Reviewed  Cardiac Studies   2D Echocardiogram 9.6.2022  1. Left ventricular ejection fraction, by estimation, is 45 to 50%. The  left ventricle has mildly decreased function. The left ventricle  demonstrates global hypokinesis. The left ventricular internal cavity size  was mildly to moderately dilated. There  is moderate left ventricular hypertrophy. Left ventricular diastolic  parameters are consistent with Grade I diastolic dysfunction (impaired  relaxation). Elevated left atrial pressure. The average left ventricular  global longitudinal strain is -12.4 %.  The global longitudinal strain is abnormal.   2. Right ventricular systolic function is normal. The right ventricular  size is normal. There is normal pulmonary artery systolic pressure.   3. Left atrial size was mild to moderately dilated.   4. The mitral valve is abnormal. Mild mitral valve regurgitation. No  evidence of mitral stenosis.   5. The aortic valve is tricuspid. There is mild calcification of the  aortic valve. There is mild thickening of the aortic valve. Aortic valve  regurgitation is mild. Mild to moderate aortic valve  sclerosis/calcification is present, without any evidence  of aortic stenosis.   6. There is mild dilatation of the ascending aorta, measuring 39 mm.   7. The inferior vena cava is normal in size with greater than 50%  respiratory variability, suggesting right atrial pressure of 3 mmHg.   Patient Profile     75 y.o. male w/ a h/o COPD, PAF, cardiomyopathy (? ICM vs NICM), HTN, HL, PAD w/ chronic dissection of L subclavian artery followed by vasc surgery, OSA, renal cell carcinoma s/p L heminephrectomy, tob abuse, CVA, GIB, ESRD on PD, and adenocarcinoma of the appendix, who was admitted 11/9 w/ resp failure, CHF, and afib w/  RVR.   Assessment & Plan    1.  Paroxysmal atrial fibrillation: Patient presented with acute on chronic diastolic heart failure, COPD, and A. fib with RVR.  EF 45 to 50% by echo.  In the setting of recurrent A. fib with RVR during hospitalization, we opted to add amiodarone 40 mg twice daily on November 11.  He has been tolerating this thus far though did have 1 episode of A. fib last night with a rate of about 104, that lasted for just over 2-1/2 hours.  He says he was asymptomatic.  Continue amiodarone loading at 400 mg twice daily for 1 week, to then be reduced to 200 mg twice daily.  Continue current dose of bisoprolol.  He is anticoagulated with Eliquis.  We will arrange for early office follow-up.  He has EP follow-up in December.  2.  Acute on chronic diastolic and systolic congestive heart failure: EF 45%.  He has been receiving Lasix infusion per nephrology.  He appears euvolemic on examination.  He does peritoneal dialysis at home.  Blood pressure is chronically difficult to manage.  3.  End-stage renal disease on peritoneal dialysis: Receiving IV diuretics here.  Followed by nephrology.  4.  Essential hypertension: Historically poorly controlled.  He is on amlodipine 10 mg daily, bisoprolol 5 mg daily, Imdur 60 mg daily, and clonidine patch 0.1 mg weekly.  He has previous intolerance to hydralazine.  Poor candidate for ACE/ARB/arni/spiro in the setting of end-stage renal disease on peritoneal dialysis with chronically elevated creatinines.  Limited options.  Could consider increasing clonidine patch though concerned about baseline low heart rates and increased risk of bradycardia.  Unlikely that he tolerate much more isosorbide as that is listed as an intolerance related to headaches.  5.  Anemia of chronic disease: Relatively stable.  Signed, Ovando Hodgkins, NP  04/04/2021, 8:22 AM    For questions or updates, please contact   Please consult [email or web address] for contact info under  Cardiology/STEMI.

## 2021-04-04 NOTE — Plan of Care (Signed)
  Problem: Education: Goal: Knowledge of General Education information will improve Description: Including pain rating scale, medication(s)/side effects and non-pharmacologic comfort measures Outcome: Adequate for Discharge   Problem: Health Behavior/Discharge Planning: Goal: Ability to manage health-related needs will improve Outcome: Adequate for Discharge   Problem: Clinical Measurements: Goal: Ability to maintain clinical measurements within normal limits will improve Outcome: Adequate for Discharge Goal: Will remain free from infection Outcome: Adequate for Discharge Goal: Diagnostic test results will improve Outcome: Adequate for Discharge Goal: Respiratory complications will improve Outcome: Adequate for Discharge Goal: Cardiovascular complication will be avoided Outcome: Adequate for Discharge   Problem: Activity: Goal: Risk for activity intolerance will decrease Outcome: Adequate for Discharge   Problem: Nutrition: Goal: Adequate nutrition will be maintained Outcome: Adequate for Discharge   Problem: Coping: Goal: Level of anxiety will decrease Outcome: Adequate for Discharge   Problem: Elimination: Goal: Will not experience complications related to bowel motility Outcome: Adequate for Discharge Goal: Will not experience complications related to urinary retention Outcome: Adequate for Discharge   Problem: Pain Managment: Goal: General experience of comfort will improve Outcome: Adequate for Discharge   Problem: Safety: Goal: Ability to remain free from injury will improve Outcome: Adequate for Discharge   Problem: Skin Integrity: Goal: Risk for impaired skin integrity will decrease Outcome: Adequate for Discharge  Pt dc home   

## 2021-04-04 NOTE — Progress Notes (Signed)
BP elevated, but pt refused prn hydralazine

## 2021-04-04 NOTE — TOC Progression Note (Signed)
Transition of Care [facility]) - Progression Note    Patient Details  Name: SUHAIL PELOQUIN MRN: [id number] Date of Birth: [birth year]  Transition of Care [facility]) CM/SW [place], RN Phone Number: 04/04/2021, 9:51 AM  Clinical Narrative:  Patient discharging home today.  States he has a scale, weighs himself, and declines PT OT needs at this time.  He states he has transportation to appointments. He is not on home O2 and declines this need at this time.     Expected Discharge Plan: Home/Self Care Barriers to Discharge: Continued Medical Work up  Expected Discharge Plan and Services Expected Discharge Plan: Home/Self Care       Living arrangements for the past 2 months: Single Family Home Expected Discharge Date: 04/04/21                                     Social Determinants of Health (SDOH) Interventions    Readmission Risk Interventions Readmission Risk Prevention Plan 04/03/2021 11/29/2020  Transportation Screening Complete Complete  Medication Review Press photographer) Complete Complete  PCP or Specialist appointment within 3-5 days of discharge Complete Complete  HRI or Home Care Consult Complete -  SW Recovery Care/Counseling Consult Complete -  Palliative Care Screening Not Applicable Not [place] Complete Not Applicable  Some recent data might be hidden

## 2021-04-07 ENCOUNTER — Telehealth: Payer: Self-pay

## 2021-04-07 NOTE — Telephone Encounter (Signed)
Transition Care Management Follow-up Telephone Call Date of discharge and from where: 04/04/21-[facility] How have you been since you were released from the hospital? Patient states," I am doing okay." Denies chest pain,dyspnea, orthopnea, back pain.  Headache when taking Imdur, taking tylenol with medication as previously directed. Pain relieved.   Any questions or concerns? No  Items Reviewed: Did the pt receive and understand the discharge instructions provided? Yes  Medications obtained and verified? Yes  Any new allergies since your discharge? No  Dietary orders reviewed? Yes Do you have support at home? Yes   Home Care and Equipment/Supplies: Were home health services ordered? no  Functional Questionnaire: (I = Independent and D = Dependent) ADLs: I  Follow up appointments reviewed:  PCP Hospital f/u appt confirmed? Yes  Scheduled to see PCP on 04/21/21. [facility] f/u appt ?  Scheduled to see Nephrology on 11/28. Agrees to call Cardiology.  Are transportation arrangements needed? No  If their condition worsens, is the pt aware to call PCP or go to the Emergency Dept.? Yes Was the patient provided with contact information for the PCP's office or ED? Yes Was to pt encouraged to call back with questions or concerns? Yes

## 2021-04-08 DIAGNOSIS — I5022 Chronic systolic (congestive) heart failure: Secondary | ICD-10-CM

## 2021-04-08 DIAGNOSIS — I480 Paroxysmal atrial fibrillation: Secondary | ICD-10-CM | POA: Diagnosis not present

## 2021-04-08 DIAGNOSIS — Z992 Dependence on renal dialysis: Secondary | ICD-10-CM

## 2021-04-08 DIAGNOSIS — N186 End stage renal disease: Secondary | ICD-10-CM

## 2021-04-08 DIAGNOSIS — I10 Essential (primary) hypertension: Secondary | ICD-10-CM | POA: Diagnosis not present

## 2021-04-09 NOTE — Progress Notes (Signed)
Patient ID: [name], male    DOB: [birth year], 75 y.o.   MRN: [id number]  HPI  [name] is a 75 y/o male with a history of renal cancer, carotid artery disease, DM, hyperlipidemia, HTN, CKD (on peritoneal dialysis), stroke, atrial fibrillation, sleep apnea, PAD, current tobacco use and chronic heart failure.   Echo report from 01/27/21 reviewed and showed an EF of 45-50% along with moderate LVH, mild/moderate LAE and mild Brett.   Admitted 04/01/21 due to shortness of breath. Placed on furosemide infusion with transition to oral diuretics. Cardiology consult obtained. Placed on diltiazem drip due to AF but then converted back to NSR and placed on oral amiodarone. Continued peritoneal dialysis. Discharged after 3 days.   He presents today for his initial visit with a chief complaint of moderate fatigue with little exertion. He describes this as chronic in nature having been present for several years. He has associated nasal congestion, productive cough, palpitations and light-headedness along with this. He denies any difficulty sleeping, abdominal distention, pedal edema, chest pain or weight gain.   Does nightly peritoneal dialysis for ~6 hours a night. Says that he still urinates and tries to keep daily fluid intake to ~ 64 ounces although admits that he doesn't measure it out.   Says that a few days ago, he really noticed his palpitations for a few hours and then they just stopped. Wasn't doing anything exertional at the time.   Does report issues with his BP dropping especially when he stands. Says that when it's "normal", he feels weak and very fatigued.   Past Medical History:  Diagnosis Date   Acute on chronic systolic congestive heart failure [facility])    Adenocarcinoma of appendix [facility]) Jan 2006   right kidney, s/p cryoablation   Atrial fibrillation with rapid ventricular response ([place]) 05/12/2020   Cardiomyopathy ([place])    a. 12/2018 Echo: EF 40-45%, global HK. Asc Ao 3.7cm; b. 01/2019  Lexi MV: small, mild, fixed basal and mid antlat defect - scar vs artifact. Small, mild mid and apical inf minimally reversible defect, likely scar w/ peri-infarct ischemia. Coronary and Ao atherosclerosis; c. 12/2019 Echo: EF 40-45%, glob HK, Gr1 DD. Nl RV fxn. Sev dil LA. Mild Brett. Mild-mod Ao sclerosis w/o stenosis. Asc Ao 78mm.   Carotid arterial disease ([place])    a. 01/2020 RICA 1-39%, RCCA/RECA < 25%, LICA 8-52%, LCCA/LECA <50%.   Claudication ([place])    a. 02/2020 ABI/TBI: R 1.08/0.95, L 1.00/0.70.   Complication of anesthesia    had to be woken up slowly as his bp was elevated when did this quickly   Diabetes mellitus without complication (HCC)    ESRD (end stage renal disease) ([place])    a. Peritoneal Dialysis pt.   Hyperlipidemia    Hypertension    Migraine    cluster   Neuromuscular disorder ([place])    left lower extrem neuropathy   Obstructive sleep apnea    no OSA since had facial surgery with dr. Kathyrn Sheriff in 1997   PAD (peripheral artery disease) [facility]) Feb 2009   nonobstructing, renal angiogram [name]   Renal cell carcinoma 2004   left kidney heminephrectomy   Renal insufficiency    Sepsis ([place]) 11/06/2020   Stroke ([place])    a. 12/2019 MRI: Small acute infarcts involving the left cerebral hemisphere and several chronic infarcts.   TIA (transient ischemic attack)    no residual but left leg and foot still feel heavy   tobacco abuse  Past Surgical History:  Procedure Laterality Date   CAPD INSERTION N/A 03/01/2019   Procedure: LAPAROSCOPIC INSERTION CONTINUOUS AMBULATORY PERITONEAL DIALYSIS  (CAPD) CATHETER;  Surgeon: Algernon Huxley, MD;  Location: [facility] ORS;  Service: Vascular;  Laterality: N/A;   CARDIAC CATHETERIZATION     Dr. [name] did this to assess his renal artery   cyst removal  12/25/2015   Spine L4 and L5   heminephrectomy  2004   for renal cell CA   RENAL CRYOABLATION  Jan 2006   right kidney,  [name]   sciatica     Family History  Problem Relation Age of Onset    Hypertension Mother    Cancer Mother        breast   Aneurysm Mother    Coronary artery disease Father    Hypertension Father    Stroke Father 42   Heart disease Father    Heart attack Father 36   Aneurysm Maternal Grandmother        brain   Aneurysm Paternal Grandmother        brain   Coronary artery disease Paternal Grandfather    Heart disease Brother        valvular heart disease   COPD Brother    Hypertension Brother    Stroke Paternal Uncle    Social History   Tobacco Use   Smoking status: Every Day    Packs/day: 1.00    Years: 50.00    Pack years: 50.00    Types: Cigarettes   Smokeless tobacco: Never  Substance Use Topics   Alcohol use: Not Currently   Allergies  Allergen Reactions   Irbesartan Other (See Comments)    hyperkalemia   Cymbalta [Duloxetine Hcl]    Hydralazine Other (See Comments)    headache   Imdur [Isosorbide Nitrate] Other (See Comments)    headache   Atorvastatin Other (See Comments)    Muscle pain   Bystolic [Nebivolol Hcl]     Extreme fatigue    Prior to Admission medications   Medication Sig Start Date End Date Taking? Authorizing Provider  albuterol (VENTOLIN HFA) 108 (90 Base) MCG/ACT inhaler Inhale 2 puffs into the lungs every 6 (six) hours as needed for wheezing or shortness of breath. 03/25/21  Yes Vickie Epley, MD  amiodarone (PACERONE) 200 MG tablet Take 2 tablets twice daily for 7 days (until 04/11/21), then reduce to 1 tablet twice daily (starting 04/12/21). 04/04/21  Yes Arrien, Jimmy Picket, MD  amLODipine (NORVASC) 10 MG tablet TAKE 1 TABLET BY MOUTH ONCE DAILY 03/23/21  Yes Crecencio Mc, MD  apixaban (ELIQUIS) 5 MG TABS tablet Take 1 tablet (5 mg total) by mouth 2 (two) times daily. 03/25/21  Yes Vickie Epley, MD  Ascorbic Acid 500 MG CHEW Chew 1 tablet by mouth daily.   Yes [provider]  b complex vitamins capsule Take 1 capsule by mouth daily.   Yes [provider]  bisoprolol  (ZEBETA) 5 MG tablet Take 1 tablet (5 mg total) by mouth daily. 04/05/21 05/05/21 Yes Arrien, Jimmy Picket, MD  cloNIDine (CATAPRES - DOSED IN MG/24 HR) 0.1 mg/24hr patch Place 1 patch (0.1 mg total) onto the skin every Friday. 03/27/21  Yes Vickie Epley, MD  diphenhydrAMINE (BENADRYL) 25 MG tablet Take 25 mg by mouth every 6 (six) hours as needed for allergies.   Yes [provider]  doxazosin (CARDURA) 8 MG tablet Take 8 mg by mouth daily. 03/29/20  Yes [provider]  fluticasone (FLONASE) 50 MCG/ACT nasal spray Place 2 sprays into both nostrils daily as needed for allergies or rhinitis.   Yes [provider]  gentamicin cream (GARAMYCIN) 0.1 % Apply 1 application topically daily. 11/07/20  Yes Fritzi Mandes, MD  losartan (COZAAR) 100 MG tablet Take 100 mg by mouth daily. 02/19/21  Yes [provider]  multivitamin (RENA-VIT) TABS tablet Take 1 tablet by mouth at bedtime. 12/21/19  Yes [provider]  rosuvastatin (CRESTOR) 20 MG tablet TAKE 1 TABLET BY MOUTH AT BEDTIME 12/29/20  Yes Theora Gianotti, NP  torsemide (DEMADEX) 100 MG tablet Take 100 mg by mouth daily.   Yes [provider]  calcitRIOL (ROCALTROL) 0.25 MCG capsule Take 0.25 mcg by mouth every Monday, Wednesday, and Friday. Patient not taking: Reported on 04/10/2021    [provider]  isosorbide mononitrate (IMDUR) 60 MG 24 hr tablet Take 1 tablet (60 mg total) by mouth daily. Patient not taking: Reported on 04/10/2021 04/04/21 05/04/21  Arrien, Jimmy Picket, MD  sevelamer (RENAGEL) 800 MG tablet Take 800 mg by mouth 3 (three) times daily with meals.    [provider]   Review of Systems  Constitutional:  Positive for fatigue (easily). Negative for appetite change.  HENT:  Positive for congestion. Negative for postnasal drip and sore throat.   Eyes: Negative.   Respiratory:  Positive for cough (productive) and shortness of breath (easily).  Negative for chest tightness.   Cardiovascular:  Positive for palpitations. Negative for chest pain and leg swelling.  Gastrointestinal:  Negative for abdominal distention and abdominal pain.  Endocrine: Negative.   Genitourinary: Negative.   Musculoskeletal:  Negative for back pain and neck pain.  Skin: Negative.   Allergic/Immunologic: Negative.   Neurological:  Positive for light-headedness. Negative for dizziness.  Hematological:  Negative for adenopathy. Does not bruise/bleed easily.  Psychiatric/Behavioral:  Negative for dysphoric mood and sleep disturbance (sleeping on 1-2 pillows). The patient is not nervous/anxious.    Vitals:   04/10/21 1157 04/10/21 1233 04/10/21 1234  BP: (!) 186/61 (!) 173/58 (!) 156/64  Pulse: 61    Resp: 18    SpO2: 91%    Weight: 148 lb 6 oz (67.3 kg)    Height: 5\' 6"  (1.676 m)     Wt Readings from Last 3 Encounters:  04/10/21 148 lb 6 oz (67.3 kg)  04/04/21 145 lb 8 oz (66 kg)  03/25/21 152 lb (68.9 kg)   Lab Results  Component Value Date   CREATININE 8.05 (H) 04/04/2021   CREATININE 7.98 (H) 04/03/2021   CREATININE 8.11 (H) 04/02/2021   Physical Exam Vitals and nursing note reviewed.  Constitutional:      Appearance: He is well-developed.  HENT:     Head: Normocephalic and atraumatic.  Neck:     Vascular: No JVD.  Cardiovascular:     Rate and Rhythm: Normal rate and regular rhythm.  Pulmonary:     Effort: Pulmonary effort is normal. No accessory muscle usage.     Breath sounds: No wheezing, rhonchi or rales.  Abdominal:     Palpations: Abdomen is soft.     Tenderness: There is no abdominal tenderness.  Musculoskeletal:     Cervical back: Normal range of motion and neck supple.     Right lower leg: No tenderness. No edema.     Left lower leg: No tenderness. No edema.  Skin:    General: Skin is warm and dry.  Neurological:  General: No focal deficit present.     Mental Status: He is alert and oriented to person, place, and  time.  Psychiatric:        Mood and Affect: Mood normal.        Behavior: Behavior normal.    Assessment & Plan:  1: Chronic heart failure with mildly reduced ejection fraction with structural changes (LVH)- - NYHA class III - euvolemic today - already weighing daily and says that his home weight ranges from 140-142 pounds; instructed to call for an overnight weight gain of > 2 pounds or a weekly weight gain of > 5 pounds - not adding salt and a low sodium cookbook was provided - saw cardiology (End) 02/27/21; returns 04/23/21 - on GMDT of losartan and bisoprolol - renal function does not allow for MRA or SLGT2 - tries to keep daily fluid intake to 64 ounces/ day but doesn't measure it out - BNP 08/06/20 was 1785.3  2: HTN- - BP elevated initially (186/61); rechecked sitting was 173/58 and then standing it dropped to 156/54 - saw PCP Derrel Nip) 11/21/20; returns 04/21/21 - BMP from 04/04/21 reviewed and showed sodium 140, potassium 3.8, creatinine 8.05 and GFR 6  3: DM- - A1c 11/21/20 was 6.9%  4: ESRD- - on peritoneal dialysis 6 hours a night - saw vascular ([place]) 02/17/21  5: Atrial fibrillation- - saw EP Quentin Ore) 03/25/21; returns 05/06/21 - currently on amiodarone & apixaban   Medication list reviewed.   Return in 2 months or sooner for any questions/problems before then.

## 2021-04-10 ENCOUNTER — Other Ambulatory Visit: Payer: Self-pay

## 2021-04-10 ENCOUNTER — Ambulatory Visit: Payer: Medicare Other | Attending: Family | Admitting: Family

## 2021-04-10 ENCOUNTER — Encounter: Payer: Self-pay | Admitting: Family

## 2021-04-10 VITALS — BP 156/64 | HR 61 | Resp 18 | Ht 66.0 in | Wt 148.4 lb

## 2021-04-10 DIAGNOSIS — N186 End stage renal disease: Secondary | ICD-10-CM | POA: Diagnosis not present

## 2021-04-10 DIAGNOSIS — Z85528 Personal history of other malignant neoplasm of kidney: Secondary | ICD-10-CM | POA: Diagnosis not present

## 2021-04-10 DIAGNOSIS — I4891 Unspecified atrial fibrillation: Secondary | ICD-10-CM | POA: Diagnosis not present

## 2021-04-10 DIAGNOSIS — Z992 Dependence on renal dialysis: Secondary | ICD-10-CM | POA: Diagnosis not present

## 2021-04-10 DIAGNOSIS — I2510 Atherosclerotic heart disease of native coronary artery without angina pectoris: Secondary | ICD-10-CM | POA: Diagnosis not present

## 2021-04-10 DIAGNOSIS — E1151 Type 2 diabetes mellitus with diabetic peripheral angiopathy without gangrene: Secondary | ICD-10-CM | POA: Diagnosis not present

## 2021-04-10 DIAGNOSIS — E1122 Type 2 diabetes mellitus with diabetic chronic kidney disease: Secondary | ICD-10-CM

## 2021-04-10 DIAGNOSIS — F17210 Nicotine dependence, cigarettes, uncomplicated: Secondary | ICD-10-CM | POA: Insufficient documentation

## 2021-04-10 DIAGNOSIS — Z79899 Other long term (current) drug therapy: Secondary | ICD-10-CM | POA: Diagnosis not present

## 2021-04-10 DIAGNOSIS — E785 Hyperlipidemia, unspecified: Secondary | ICD-10-CM | POA: Insufficient documentation

## 2021-04-10 DIAGNOSIS — I132 Hypertensive heart and chronic kidney disease with heart failure and with stage 5 chronic kidney disease, or end stage renal disease: Secondary | ICD-10-CM | POA: Diagnosis not present

## 2021-04-10 DIAGNOSIS — I5022 Chronic systolic (congestive) heart failure: Secondary | ICD-10-CM | POA: Diagnosis not present

## 2021-04-10 DIAGNOSIS — I480 Paroxysmal atrial fibrillation: Secondary | ICD-10-CM

## 2021-04-10 DIAGNOSIS — Z7901 Long term (current) use of anticoagulants: Secondary | ICD-10-CM | POA: Diagnosis not present

## 2021-04-10 DIAGNOSIS — Z8673 Personal history of transient ischemic attack (TIA), and cerebral infarction without residual deficits: Secondary | ICD-10-CM | POA: Insufficient documentation

## 2021-04-10 DIAGNOSIS — I10 Essential (primary) hypertension: Secondary | ICD-10-CM | POA: Diagnosis not present

## 2021-04-10 DIAGNOSIS — G4730 Sleep apnea, unspecified: Secondary | ICD-10-CM | POA: Diagnosis not present

## 2021-04-10 NOTE — Patient Instructions (Addendum)
Continue weighing daily and call for an overnight weight gain of > 2 pounds or a weekly weight gain of >5 pounds.    Keep daily fluid intake to around 64 ounces a day.

## 2021-04-11 ENCOUNTER — Encounter: Payer: Self-pay | Admitting: Family

## 2021-04-15 DIAGNOSIS — I5022 Chronic systolic (congestive) heart failure: Secondary | ICD-10-CM | POA: Diagnosis not present

## 2021-04-15 DIAGNOSIS — I480 Paroxysmal atrial fibrillation: Secondary | ICD-10-CM | POA: Diagnosis not present

## 2021-04-20 ENCOUNTER — Encounter: Payer: Self-pay | Admitting: Internal Medicine

## 2021-04-20 DIAGNOSIS — N62 Hypertrophy of breast: Secondary | ICD-10-CM | POA: Insufficient documentation

## 2021-04-21 ENCOUNTER — Encounter: Payer: Self-pay | Admitting: Internal Medicine

## 2021-04-21 ENCOUNTER — Ambulatory Visit (INDEPENDENT_AMBULATORY_CARE_PROVIDER_SITE_OTHER): Payer: Medicare Other | Admitting: Internal Medicine

## 2021-04-21 ENCOUNTER — Ambulatory Visit (INDEPENDENT_AMBULATORY_CARE_PROVIDER_SITE_OTHER): Payer: Medicare Other

## 2021-04-21 ENCOUNTER — Other Ambulatory Visit: Payer: Self-pay

## 2021-04-21 VITALS — Ht 66.0 in | Wt 148.0 lb

## 2021-04-21 DIAGNOSIS — Z992 Dependence on renal dialysis: Secondary | ICD-10-CM

## 2021-04-21 DIAGNOSIS — I10 Essential (primary) hypertension: Secondary | ICD-10-CM

## 2021-04-21 DIAGNOSIS — N62 Hypertrophy of breast: Secondary | ICD-10-CM

## 2021-04-21 DIAGNOSIS — L853 Xerosis cutis: Secondary | ICD-10-CM

## 2021-04-21 DIAGNOSIS — Z09 Encounter for follow-up examination after completed treatment for conditions other than malignant neoplasm: Secondary | ICD-10-CM | POA: Diagnosis not present

## 2021-04-21 DIAGNOSIS — I5020 Unspecified systolic (congestive) heart failure: Secondary | ICD-10-CM

## 2021-04-21 DIAGNOSIS — N186 End stage renal disease: Secondary | ICD-10-CM | POA: Diagnosis not present

## 2021-04-21 DIAGNOSIS — I5022 Chronic systolic (congestive) heart failure: Secondary | ICD-10-CM

## 2021-04-21 DIAGNOSIS — Z0000 Encounter for general adult medical examination without abnormal findings: Secondary | ICD-10-CM

## 2021-04-21 DIAGNOSIS — I161 Hypertensive emergency: Secondary | ICD-10-CM

## 2021-04-21 NOTE — Assessment & Plan Note (Signed)
He is  Now weighing daily and losing weight with regular peritoneal dialysis

## 2021-04-21 NOTE — Assessment & Plan Note (Signed)
Secondary to overdiuresis.  Advised to increase water intake and use  Eucerin daily

## 2021-04-21 NOTE — Patient Instructions (Signed)
  [name] , Thank you for taking time to come for your Medicare Wellness Visit. I appreciate your ongoing commitment to your health goals. Please review the following plan we discussed and let me know if I can assist you in the future.   These are the goals we discussed:  Goals   None     This is a list of the screening recommended for you and due dates:  Health Maintenance  Topic Date Due   Complete foot exam   07/17/2016   Eye exam for diabetics  04/21/2021*   Zoster (Shingles) Vaccine (1 of 2) 07/21/2021*   Colon Cancer Screening  04/21/2022*   Hemoglobin A1C  05/24/2021   Tetanus Vaccine  05/29/2028   Pneumonia Vaccine  Completed   Hepatitis C Screening: USPSTF Recommendation to screen - Ages 18-79 yo.  Completed   HPV Vaccine  Aged Out   Flu Shot  Discontinued   COVID-19 Vaccine  Discontinued  *Topic was postponed. The date shown is not the original due date.

## 2021-04-21 NOTE — Assessment & Plan Note (Signed)
He was admitted and treated for acute on chronic diastolic heart failure and discharged hone.  He is intolerant of normotension due to diffuse cerebrovascular disease.  No medication changes were made today based on home reports since discharge

## 2021-04-21 NOTE — Progress Notes (Addendum)
Subjective:   [name] is a 75 y.o. male who presents for Medicare Annual/Subsequent preventive examination.  Review of Systems    No ROS.  Medicare Wellness Virtual Visit.  Visual/audio telehealth visit, UTA vital signs.   See social history for additional risk factors.   Cardiac Risk Factors include: advanced age (>65men, >9 women);male gender;diabetes mellitus;hypertension     Objective:    Today's Vitals   04/21/21 0922  Weight: 148 lb (67.1 kg)  Height: 5\' 6"  (1.676 m)   Body mass index is 23.89 kg/m.  Advanced Directives 04/21/2021 04/01/2021 11/28/2020 11/28/2020 11/06/2020 08/15/2020 08/06/2020  Does Patient Have a Medical Advance Directive? Yes No - Yes Yes Yes Yes  Type of Advance Directive Living will - Living will [place];Living will Living will Living will;Healthcare Power of [place] will  Does patient want to make changes to medical advance directive? No - Patient declined - No - Patient declined - No - Patient declined No - Patient declined No - Patient declined  Copy of [place] in Chart? - - - - - No - copy requested -  Would patient like information on creating a medical advance directive? - No - Patient declined - - - - -    Current Medications (verified) Outpatient Encounter Medications as of 04/21/2021  Medication Sig   albuterol (VENTOLIN HFA) 108 (90 Base) MCG/ACT inhaler Inhale 2 puffs into the lungs every 6 (six) hours as needed for wheezing or shortness of breath.   amiodarone (PACERONE) 200 MG tablet Take 2 tablets twice daily for 7 days (until 04/11/21), then reduce to 1 tablet twice daily (starting 04/12/21).   amLODipine (NORVASC) 10 MG tablet TAKE 1 TABLET BY MOUTH ONCE DAILY   apixaban (ELIQUIS) 5 MG TABS tablet Take 1 tablet (5 mg total) by mouth 2 (two) times daily.   Ascorbic Acid 500 MG CHEW Chew 1 tablet by mouth daily.   b complex vitamins capsule Take 1 capsule by mouth daily.    bisoprolol (ZEBETA) 5 MG tablet Take 1 tablet (5 mg total) by mouth daily.   calcitRIOL (ROCALTROL) 0.25 MCG capsule Take 0.25 mcg by mouth every Monday, Wednesday, and Friday. (Patient not taking: Reported on 04/10/2021)   cloNIDine (CATAPRES - DOSED IN MG/24 HR) 0.1 mg/24hr patch Place 1 patch (0.1 mg total) onto the skin every Friday.   diphenhydrAMINE (BENADRYL) 25 MG tablet Take 25 mg by mouth every 6 (six) hours as needed for allergies.   doxazosin (CARDURA) 8 MG tablet Take 8 mg by mouth daily.   fluticasone (FLONASE) 50 MCG/ACT nasal spray Place 2 sprays into both nostrils daily as needed for allergies or rhinitis.   gentamicin cream (GARAMYCIN) 0.1 % Apply 1 application topically daily.   isosorbide mononitrate (IMDUR) 60 MG 24 hr tablet Take 1 tablet (60 mg total) by mouth daily. (Patient not taking: Reported on 04/10/2021)   losartan (COZAAR) 100 MG tablet Take 100 mg by mouth daily.   multivitamin (RENA-VIT) TABS tablet Take 1 tablet by mouth at bedtime.   rosuvastatin (CRESTOR) 20 MG tablet TAKE 1 TABLET BY MOUTH AT BEDTIME   sevelamer (RENAGEL) 800 MG tablet Take 800 mg by mouth 3 (three) times daily with meals.   torsemide (DEMADEX) 100 MG tablet Take 100 mg by mouth daily.   No facility-administered encounter medications on file as of 04/21/2021.    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 12/2018 Echo: EF 40-45%, global HK. Asc Ao 3.7cm; b. 01/2019 Lexi MV: small, mild, fixed basal and mid antlat defect - scar vs artifact. Small, mild mid and apical inf minimally reversible defect, likely  scar w/ peri-infarct ischemia. Coronary and Ao atherosclerosis; c. 12/2019 Echo: EF 40-45%, glob HK, Gr1 DD. Nl RV fxn. Sev dil LA. Mild MR. Mild-mod Ao sclerosis w/o stenosis. Asc Ao 33mm.   Carotid arterial disease ([place])    a. 01/2020 RICA 1-39%, RCCA/RECA < 10%, LICA 2-58%, LCCA/LECA <50%.   Claudication ([place])    a. 02/2020 ABI/TBI: R 1.08/0.95, L 1.00/0.70.   Complication of anesthesia    had to be woken up slowly as his bp was elevated when did this quickly   Diabetes mellitus without complication (HCC)    ESRD (end stage renal disease) ([place])    a. Peritoneal Dialysis pt.   Hyperlipidemia    Hypertension    Migraine    cluster   Neuromuscular disorder ([place])    left lower extrem neuropathy   Obstructive sleep apnea    no OSA since had facial surgery with dr. Kathyrn Sheriff in 1997   PAD (peripheral artery disease) [facility]) Feb 2009   nonobstructing, renal angiogram [name]   Renal cell carcinoma 2004   left kidney heminephrectomy   Renal insufficiency    Sepsis ([place]) 11/06/2020   Stroke ([place])    a. 12/2019 MRI: Small acute infarcts involving the left cerebral hemisphere and several chronic infarcts.   TIA (transient ischemic attack)    no residual but left leg and foot still feel heavy   tobacco abuse    Past Surgical History:  Procedure Laterality Date   CAPD INSERTION N/A 03/01/2019   Procedure: LAPAROSCOPIC INSERTION CONTINUOUS AMBULATORY PERITONEAL DIALYSIS  (CAPD) CATHETER;  Surgeon: Algernon Huxley, MD;  Location: [facility] ORS;  Service: Vascular;  Laterality: N/A;   CARDIAC CATHETERIZATION     Dr. [name] did this to assess his renal artery   cyst removal  12/25/2015   Spine L4 and L5   heminephrectomy  2004   for renal cell CA   RENAL CRYOABLATION  Jan 2006   right kidney,  [name]   sciatica     Family History  Problem Relation Age of Onset   Hypertension Mother    Cancer Mother        breast   Aneurysm Mother    Coronary artery disease Father    Hypertension Father    Stroke  Father 53   Heart disease Father    Heart attack Father 37   Aneurysm Maternal Grandmother        brain   Aneurysm Paternal Grandmother        brain   Coronary artery disease Paternal Grandfather    Heart disease Brother        valvular heart disease   COPD Brother    Hypertension Brother    Stroke Paternal Uncle    Social History   Socioeconomic History   Marital status: Married    Spouse name: Bailey Mech   Number of children: Not on file   Years of education: Not on file   Highest education level: Not on file  Occupational  History   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, advertising  Tobacco Use   Smoking status: Every Day    Packs/day: 1.00    Years: 50.00    Pack years: 50.00    Types: Cigarettes   Smokeless tobacco: Never  Vaping Use   Vaping Use: Former   Devices: tried but did not like  Substance and Sexual Activity   Alcohol use: Not Currently   Drug use: No   Sexual activity: Yes  Other Topics Concern   Not on file  Social History Narrative   Not on file   Social Determinants of Health   Financial Resource Strain: Low Risk    Difficulty of Paying Living Expenses: Not hard at all  Food Insecurity: No Food Insecurity   Worried About Charity fundraiser in the Last Year: Never true   [place] in the Last Year: Never true  Transportation Needs: No Transportation Needs   Lack of Transportation (Medical): No   Lack of Transportation (Non-Medical): No  Physical Activity: Insufficiently Active   Days of Exercise per Week: 3 days   Minutes of Exercise per Session: 20 min  Stress: No Stress Concern Present   Feeling of Stress : Not at all  Social Connections: Unknown   Frequency of Communication with Friends and Family: Not on file   Frequency of Social Gatherings with Friends and Family: Not on file   Attends Religious Services: Not on Electrical engineer or Organizations: Not on file   Attends Archivist Meetings: Not on file    Marital Status: Married    Tobacco Counseling Ready to quit: Not Answered Counseling given: Not Answered   Clinical Intake:  Pre-visit preparation completed: Yes        Diabetes: Yes  How often do you need to have someone help you when you read instructions, pamphlets, or other written materials from your doctor or pharmacy?: 1 - Never    Interpreter Needed?: No      Activities of Daily Living In your present state of health, do you have any difficulty performing the following activities: 04/21/2021 04/10/2021  Hearing? Y Y  Comment Hearing aids -  Vision? N N  Difficulty concentrating or making decisions? N N  Walking or climbing stairs? N Y  Comment Paces self with walking -  Dressing or bathing? N Y  Doing errands, shopping? N N  Preparing Food and eating ? N -  Using the Toilet? N -  In the past six months, have you accidently leaked urine? N -  Do you have problems with loss of bowel control? N -  Managing your Medications? N -  Managing your Finances? N -  Housekeeping or managing your Housekeeping? N -  Some recent data might be hidden    Patient Care Team: Crecencio Mc, MD as PCP - General (Internal Medicine) End, Harrell Gave, MD as PCP - Cardiology (Cardiology) Vickie Epley, MD as PCP - Electrophysiology (Cardiology)  Indicate any recent Medical Services you may have received from other than [facility] providers in the past year (date may be approximate).     Assessment:   This is a routine wellness examination for [place].  Virtual Visit via Telephone Note  I connected with  [name] on 04/21/21 at  9:00 AM EST by telephone and verified that I am speaking with the correct person using two identifiers.  Location: Patient: home Provider: office Persons participating in the  virtual visit: patient/Nurse Health Advisor   I discussed the limitations, risks, security and privacy concerns of performing an evaluation and management service  by telephone and the availability of in person appointments. The patient expressed understanding and agreed to proceed.  Interactive audio and video telecommunications were attempted between this nurse and patient, however failed, due to patient having technical difficulties OR patient did not have access to video capability.  We continued and completed visit with audio only.  Some vital signs may be absent or patient reported.   Hearing/Vision screen Hearing Screening - Comments:: Followed by [facility]  Hearing aid, bilateral Vision Screening - Comments:: Followed by [facility]  Does not wearing glasses  Encouraged to make an eye exam  Vision screening deferred per patient request  No retinopathy reported  Dietary issues and exercise activities discussed: Current Exercise Habits: Home exercise routine, Intensity: Mild Healthy diet    Goals Addressed               This Visit's Progress     Patient Stated     I would like to walk more for exercise (pt-stated)         Depression Screen PHQ 2/9 Scores 04/21/2021 04/10/2021 08/20/2020 08/15/2020 03/10/2020 04/10/2018 04/08/2017  PHQ - 2 Score 0 0 3 0 0 0 0  PHQ- 9 Score - - 10 - - - 0    Fall Risk Fall Risk  04/21/2021 04/10/2021 11/21/2020 08/20/2020 08/15/2020  Falls in the past year? 0 0 0 0 0  Comment - - - - -  Number falls in past yr: 0 0 - - 0  Comment - - - - -  Injury with Fall? - 0 - - 0  Risk for fall due to : - Impaired balance/gait - - Medication side effect  Follow up Falls evaluation completed Falls evaluation completed;Falls prevention discussed Falls evaluation completed Falls evaluation completed Falls evaluation completed;Education provided;Falls prevention discussed    FALL RISK PREVENTION PERTAINING TO THE HOME: Home free of loose throw rugs in walkways, pet beds, electrical cords, etc? Yes  Adequate lighting in your home to reduce risk of falls? Yes   ASSISTIVE DEVICES UTILIZED TO  PREVENT FALLS: Life alert? No  Use of a cane, walker or w/c? No  Grab bars in the bathroom? Yes  Shower chair or bench in shower? No  Comfort height toilet? Yes   TIMED UP AND GO: Was the test performed? No .   Cognitive Function:  Patient is alert and oriented x3.    6CIT Screen 04/21/2021 04/10/2018 04/08/2017 04/07/2016  What Year? 0 points 0 points 0 points 0 points  What month? 0 points 0 points 0 points 0 points  What time? 0 points 0 points - 0 points  Count back from 20 0 points 0 points - 0 points  Months in reverse 0 points 0 points - 0 points    Immunizations Immunization History  Administered Date(s) Administered   Hepatitis B, adult 04/05/2019   Influenza-Unspecified 04/05/2019   Moderna Sars-Covid-2 Vaccination 01/21/2020, 02/14/2020   PPD Test 07/30/2020, 08/13/2020   Pneumococcal Conjugate-13 02/15/2014   Pneumococcal Polysaccharide-23 01/23/2013, 05/29/2018   Pneumococcal-Unspecified 06/18/2017   Tdap 02/03/2009, 05/29/2018   Flu Vaccine status: Declined, Education has been provided regarding the importance of this vaccine but patient still declined. Advised may receive this vaccine at local pharmacy or Health Dept. Aware to provide a copy of the vaccination record if obtained from local pharmacy or Health Dept.  Verbalized acceptance and understanding.  Shingrix Completed?: No.    Education has been provided regarding the importance of this vaccine. Patient has been advised to call insurance company to determine out of pocket expense if they have not yet received this vaccine. Advised may also receive vaccine at local pharmacy or Health Dept. Verbalized acceptance and understanding.  Screening Tests Health Maintenance  Topic Date Due   FOOT EXAM  07/17/2016   OPHTHALMOLOGY EXAM  04/21/2021 (Originally 09/09/2014)   Zoster Vaccines- Shingrix (1 of 2) 07/21/2021 (Originally 11/04/1964)   COLONOSCOPY (Pts 45-79yrs Insurance coverage will need to be confirmed)   04/21/2022 (Originally 05/24/2016)   HEMOGLOBIN A1C  05/24/2021   TETANUS/TDAP  05/29/2028   Pneumonia Vaccine 60+ Years old  Completed   Hepatitis C Screening  Completed   HPV VACCINES  Aged Out   INFLUENZA VACCINE  Discontinued   COVID-19 Vaccine  Discontinued    Health Maintenance Health Maintenance Due  Topic Date Due   FOOT EXAM  07/17/2016   Colonoscopy- declined.   Lung Cancer Screening: (Low Dose CT Chest recommended if Age 105-80 years, 30 pack-year currently smoking OR have quit w/in 15years.) does qualify. Completed 04/01/21.  Vision Screening: Recommended annual ophthalmology exams for early detection of glaucoma and other disorders of the eye.  Dental Screening: Recommended annual dental exams for proper oral hygiene  Community Resource Referral / Chronic Care Management: CRR required this visit?  No   CCM required this visit?  No      Plan:   Keep all routine maintenance appointments.   I have personally reviewed and noted the following in the patient's chart:   Medical and social history Use of alcohol, tobacco or illicit drugs  Current medications and supplements including opioid prescriptions. Patient is not currently taking opioid prescriptions. Functional ability and status Nutritional status Physical activity Advanced directives List of other physicians Hospitalizations, surgeries, and ER visits in previous 12 months Vitals Screenings to include cognitive, depression, and falls Referrals and appointments  In addition, I have reviewed and discussed with patient certain preventive protocols, quality metrics, and best practice recommendations. A written personalized care plan for preventive services as well as general preventive health recommendations were provided to patient.     Varney Biles, LPN   [id number]

## 2021-04-21 NOTE — Progress Notes (Signed)
Subjective:  Patient ID: [name], male    DOB: 06-Mar-1946  Age: 75 y.o. MRN: [id number]  CC: Diagnoses of Gynecomastia, male, Hypertensive emergency, ESRD on peritoneal dialysis ([place]), HFrEF (heart failure with reduced ejection fraction) ([place]), Chronic HFrEF (heart failure with reduced ejection fraction) [facility]), Hospital discharge follow-up, and Xerosis of skin were pertinent to this visit.  HPI [name] presents for hospital follow up Chief Complaint  Patient presents with   Hospitalization Follow-up    Pt was admitted for A. Fib.     This visit occurred during the SARS-CoV-2 public health emergency.  Safety protocols were in place, including screening questions prior to the visit, additional usage of staff PPE, and extensive cleaning of exam room while observing appropriate contact time as indicated for disinfecting solutions.   Presented to [facility] on Nov 9 with sudden onset of  orthopnea  that started the day prior to admission.  He was weighing daily prior to admission and had noted 'some weight gain " but did not call the CHF clinic .  admitted to [facility]  on Nov 9 with hypertensive emergency and new onset atrial fibrillation .  Acute on chronic diastolic heart failure  with hypoxia room air , sats 77% due to pulmonary vascular congestion,  lower extremity edema and BP 211 systolic.   Converted to sinus rhtyhm in house,  discharged on Nov 12 with bisoprolol and amiodarone load  with directions to reduce dose to 200 mg bid on Nov 19, which he has done.  tizanidine and D3 were stopped   Since discharge his weight has been decreasing and is down to 139 lbs currently with home peritoneal dialysis,  appetite is stable .     HTN:  BP's at home since discharge  have been as low as 118/48,  (he feels bad at  any level of systolic < 941) but average is 740 systolic  and pulse has been in the 50's.  Systolic has been labile,  he tries to avoid salt but still uses it  almost daily   Several  issues new:  1)he reported to Dr Rolly Salter yesterday that his breasts have become tender to palpation and nipples have becme "hard" .  Started several months ago, long before amiodarone .  .better now  2) Skin on back , legs and arms has become very pruritic  .  Has not mentioned pruritic skin to [place].  Using AmLactin every other day ,  no daily moisturizer.  Water intake estimated at 16 ounces daily   Reviewed recent labs from [place].  Encouraged to eat more protein  Outpatient Medications Prior to Visit  Medication Sig Dispense Refill   albuterol (VENTOLIN HFA) 108 (90 Base) MCG/ACT inhaler Inhale 2 puffs into the lungs every 6 (six) hours as needed for wheezing or shortness of breath. 8 g 11   amiodarone (PACERONE) 200 MG tablet Take 2 tablets twice daily for 7 days (until 04/11/21), then reduce to 1 tablet twice daily (starting 04/12/21). 60 tablet 0   amLODipine (NORVASC) 10 MG tablet TAKE 1 TABLET BY MOUTH ONCE DAILY 90 tablet 0   apixaban (ELIQUIS) 5 MG TABS tablet Take 1 tablet (5 mg total) by mouth 2 (two) times daily. 60 tablet 11   Ascorbic Acid 500 MG CHEW Chew 1 tablet by mouth daily.     b complex vitamins capsule Take 1 capsule by mouth daily.     bisoprolol (ZEBETA) 5 MG tablet Take 1 tablet (  5 mg total) by mouth daily. 30 tablet 0   calcitRIOL (ROCALTROL) 0.25 MCG capsule Take 0.25 mcg by mouth every Monday, Wednesday, and Friday.  3   cloNIDine (CATAPRES - DOSED IN MG/24 HR) 0.1 mg/24hr patch Place 1 patch (0.1 mg total) onto the skin every Friday. 12 patch 3   diphenhydrAMINE (BENADRYL) 25 MG tablet Take 25 mg by mouth every 6 (six) hours as needed for allergies.     doxazosin (CARDURA) 8 MG tablet Take 8 mg by mouth daily.     fluticasone (FLONASE) 50 MCG/ACT nasal spray Place 2 sprays into both nostrils daily as needed for allergies or rhinitis.     gentamicin cream (GARAMYCIN) 0.1 % Apply 1 application topically daily. 15 g 0   isosorbide mononitrate (IMDUR) 60 MG 24 hr  tablet Take 1 tablet (60 mg total) by mouth daily. 30 tablet 0   losartan (COZAAR) 100 MG tablet Take 100 mg by mouth daily.     multivitamin (RENA-VIT) TABS tablet Take 1 tablet by mouth at bedtime.     rosuvastatin (CRESTOR) 20 MG tablet TAKE 1 TABLET BY MOUTH AT BEDTIME 90 tablet 0   sevelamer (RENAGEL) 800 MG tablet Take 800 mg by mouth 3 (three) times daily with meals.     torsemide (DEMADEX) 100 MG tablet Take 100 mg by mouth daily.     No facility-administered medications prior to visit.    Review of Systems;  Patient denies headache, fevers, malaise, unintentional weight loss, skin rash, eye pain, sinus congestion and sinus pain, sore throat, dysphagia,  hemoptysis , cough, dyspnea, wheezing, chest pain, palpitations, orthopnea, edema, abdominal pain, nausea, melena, diarrhea, constipation, flank pain, dysuria, hematuria, urinary  Frequency, nocturia, numbness, tingling, seizures,  Focal weakness, Loss of consciousness,  Tremor, insomnia, depression, anxiety, and suicidal ideation.      Objective:  BP (!) 156/70 (BP Location: Left Arm, Patient Position: Sitting, Cuff Size: Normal)   Pulse (!) 55   Temp (!) 96.6 F (35.9 C) (Temporal)   Ht 5\' 6"  (1.676 m)   Wt 147 lb 9.6 oz (67 kg)   SpO2 98%   BMI 23.82 kg/m   BP Readings from Last 3 Encounters:  04/21/21 (!) 156/70  04/10/21 (!) 156/64  04/04/21 (!) 158/64    Wt Readings from Last 3 Encounters:  04/21/21 147 lb 9.6 oz (67 kg)  04/21/21 148 lb (67.1 kg)  04/10/21 148 lb 6 oz (67.3 kg)    General appearance: alert, cooperative and appears older than stated age Head: Normocephalic, without obvious abnormality, atraumatic Eyes: conjunctivae/corneas clear. PERRL, EOM's intact. Fundi benign. Ears: normal TM's and external ear canals both ears Nose: Nares normal. Septum midline. Mucosa normal. No drainage or sinus tenderness. Throat: lips, mucosa, and tongue normal; teeth and gums normal Neck: no adenopathy, no carotid  bruit, no JVD, supple, symmetrical, trachea midline and thyroid not enlarged, symmetric, no tenderness/mass/nodules Lungs: clear to auscultation bilaterally Heart: regular rate and rhythm, S1, S2 normal, no murmur, click, rub or gallop Abdomen: soft, non-tender; bowel sounds normal; no masses,  no organomegaly Extremities: extremities normal, atraumatic, no cyanosis Breasts: normal appearance, no masses or tenderness.  or edema Pulses: 2+ and symmetric Skin: extremely xerotic,  weathered, peeling . no rashes or lesions Neurologic: Alert and oriented X 3, normal strength and tone. Normal symmetric reflexes. Normal coordination and gait.     Lab Results  Component Value Date   HGBA1C 6.9 (H) 11/21/2020   HGBA1C 6.3 (H) 08/06/2020  HGBA1C 5.9 (H) 05/12/2020    Lab Results  Component Value Date   CREATININE 8.05 (H) 04/04/2021   CREATININE 7.98 (H) 04/03/2021   CREATININE 8.11 (H) 04/02/2021    Lab Results  Component Value Date   WBC 11.0 (H) 04/04/2021   HGB 8.8 (L) 04/04/2021   HCT 27.3 (L) 04/04/2021   PLT 153 04/04/2021   GLUCOSE 140 (H) 04/04/2021   CHOL 122 05/12/2020   TRIG 180 (H) 05/12/2020   HDL 25 (L) 05/12/2020   LDLDIRECT 134.0 05/11/2017   LDLCALC 61 05/12/2020   ALT 14 02/27/2021   AST 12 02/27/2021   NA 140 04/04/2021   K 3.8 04/04/2021   CL 108 04/04/2021   CREATININE 8.05 (H) 04/04/2021   BUN 64 (H) 04/04/2021   CO2 24 04/04/2021   TSH 1.058 04/02/2021   PSA 9.98 (H) 01/21/2020   INR 1.1 11/06/2020   HGBA1C 6.9 (H) 11/21/2020   MICROALBUR 78.6 (H) 05/11/2017    DG Chest 2 View  Result Date: 04/01/2021 CLINICAL DATA:  Shortness of breath EXAM: CHEST - 2 VIEW COMPARISON:  Radiograph 11/28/2020 FINDINGS: Unchanged cardiomediastinal silhouette. Mild interstitial opacities. There are new small bilateral pleural effusions, left greater than right with bibasilar atelectasis. No pneumothorax. No acute osseous abnormality. IMPRESSION: Mild pulmonary edema  with small bilateral pleural effusions and adjacent basilar atelectasis Electronically Signed   By: Maurine Simmering M.D.   On: 04/01/2021 11:04    Assessment & Plan:   Problem List Items Addressed This Visit     Hospital discharge follow-up    Patient is stable post discharge and has no new issues or questions about discharge plans at the visit today for hospital follow up.  I have reviewed the records from the hospital admission in detail with patient today.      Chronic HFrEF (heart failure with reduced ejection fraction) ([place])    Recent admission for decompensated heart failure reviewed.  He is  Now monitoring for weight gain daily  Ad has lost weight since discharge      HFrEF (heart failure with reduced ejection fraction) ([place])    He has resumed use of torsemide and using  peritoneal dialysis        ESRD on peritoneal dialysis [facility])    He is  Now weighing daily and losing weight with regular peritoneal dialysis       Hypertensive emergency    He was admitted and treated for acute on chronic diastolic heart failure and discharged hone.  He is intolerant of normotension due to diffuse cerebrovascular disease.  No medication changes were made today based on home reports since discharge      Gynecomastia, male    Symptoms of nipple hardness and tenderness started over two months ago. Bilaterally but have improved.   Exam today shows no masses or evidence of gynecomastia.   Will check hormone levels and postpone mammogram given his normal exam today   .       Relevant Orders   hCG, serum, qualitative   Testosterone   Testosterone, free   Testosterone, % free   Sex hormone binding globulin   Estradiol   LH   Comprehensive metabolic panel   Xerosis of skin    Secondary to overdiuresis.  Advised to increase water intake and use  Eucerin daily        I am having [name] maintain his diphenhydrAMINE, calcitRIOL, multivitamin, doxazosin, torsemide, fluticasone, sevelamer,  Ascorbic Acid, b complex vitamins,  gentamicin cream, rosuvastatin, losartan, amLODipine, cloNIDine, albuterol, apixaban, isosorbide mononitrate, amiodarone, and bisoprolol.  No orders of the defined types were placed in this encounter.   There are no discontinued medications.  Follow-up: No follow-ups on file.   Crecencio Mc, MD

## 2021-04-21 NOTE — Patient Instructions (Addendum)
Your skin is excessively dry.  I want you to increase you liquids to 48 ounces daily and tart using Aveeno or Eucerin moisturizer EVERY DAY.   Men's breasts can become enlarged and/or tender for lots of different reasons..  want you  to return for 8 am labs so I can check your testosterone  level and a few other labs

## 2021-04-21 NOTE — Assessment & Plan Note (Signed)
Patient is stable post discharge and has no new issues or questions about discharge plans at the visit today for hospital follow up.  I have reviewed the records from the hospital admission in detail with patient today. 

## 2021-04-21 NOTE — Assessment & Plan Note (Signed)
He has resumed use of torsemide and using  peritoneal dialysis

## 2021-04-21 NOTE — Assessment & Plan Note (Signed)
Recent admission for decompensated heart failure reviewed.  He is  Now monitoring for weight gain daily  Ad has lost weight since discharge

## 2021-04-21 NOTE — Assessment & Plan Note (Addendum)
Symptoms of nipple hardness and tenderness started over two months ago. Bilaterally but have improved.   Exam today shows no masses or evidence of gynecomastia.   Will check hormone levels and postpone mammogram given his normal exam today   .

## 2021-04-22 DIAGNOSIS — N186 End stage renal disease: Secondary | ICD-10-CM | POA: Diagnosis not present

## 2021-04-22 DIAGNOSIS — Z992 Dependence on renal dialysis: Secondary | ICD-10-CM | POA: Diagnosis not present

## 2021-04-23 ENCOUNTER — Ambulatory Visit (INDEPENDENT_AMBULATORY_CARE_PROVIDER_SITE_OTHER): Payer: Medicare Other | Admitting: Internal Medicine

## 2021-04-23 ENCOUNTER — Other Ambulatory Visit: Payer: Self-pay

## 2021-04-23 ENCOUNTER — Encounter: Payer: Self-pay | Admitting: Internal Medicine

## 2021-04-23 VITALS — BP 180/70 | HR 46 | Ht 66.0 in | Wt 146.0 lb

## 2021-04-23 DIAGNOSIS — N62 Hypertrophy of breast: Secondary | ICD-10-CM

## 2021-04-23 DIAGNOSIS — I10 Essential (primary) hypertension: Secondary | ICD-10-CM

## 2021-04-23 DIAGNOSIS — N186 End stage renal disease: Secondary | ICD-10-CM

## 2021-04-23 DIAGNOSIS — I480 Paroxysmal atrial fibrillation: Secondary | ICD-10-CM | POA: Diagnosis not present

## 2021-04-23 DIAGNOSIS — I5022 Chronic systolic (congestive) heart failure: Secondary | ICD-10-CM | POA: Diagnosis not present

## 2021-04-23 MED ORDER — AMIODARONE HCL 200 MG PO TABS: 200.0000 mg | ORAL_TABLET | Freq: Every day | ORAL | 1 refills | Status: DC

## 2021-04-23 NOTE — Patient Instructions (Signed)
Medication Instructions:   Your physician has recommended you make the following change in your medication:   DECREASE Amiodarone 200 mg daily   *If you need a refill on your cardiac medications before your next appointment, please call your pharmacy*   Lab Work:  None ordered  Testing/Procedures:  None ordered   Follow-Up: At [facility], you and your health needs are our priority.  As part of our continuing mission to provide you with exceptional heart care, we have created designated Provider Care Teams.  These Care Teams include your primary Cardiologist (physician) and Advanced Practice Providers (APPs -  Physician Assistants and Nurse Practitioners) who all work together to provide you with the care you need, when you need it.  We recommend signing up for the patient portal called "MyChart".  Sign up information is provided on this After Visit Summary.  MyChart is used to connect with patients for Virtual Visits (Telemedicine).  Patients are able to view lab/test results, encounter notes, upcoming appointments, etc.  Non-urgent messages can be sent to your provider as well.   To learn more about what you can do with MyChart, go to [email or web address].    Your next appointment:   3 month(s)  The format for your next appointment:   In Person  Provider:   You may see [name], MD or one of the following Advanced Practice Providers on your designated Care Team:   Barnfield Hodgkins, NP Christell Faith, PA-C Cadence Kathlen Mody, [place]

## 2021-04-23 NOTE — Progress Notes (Signed)
Follow-up Outpatient Visit Date: 04/23/2021  Primary Care Provider: Crecencio Mc, MD [place] [address]  Chief Complaint: Follow-up heart failure and atrial fibrillation  HPI:  [name] is a 75 y.o. male with history of Andreyah Natividad-stage renal disease on peritoneal dialysis, cardiomyopathy with an EF of 40-45%, prior abnormal stress test, hypertension, hyperlipidemia, peripheral arterial disease with chronic dissection of the left subclavian artery followed by vascular surgery, sleep apnea, renal cell carcinoma status post left heminephrectomy, tobacco abuse, stroke, and adenocarcinoma of the appendix, who presents for follow-up of heart failure and atrial fibrillation.  I last saw him in early October, at which time [name] he complained of increasing fatigue, more pronounced after hospitalization for GI bleed and subsequent diagnosis of atrial fibrillation in 10/2020.  He was seen by Dr. Quentin Ore last month, who recommended initiation of anticoagulation given high risk for cardioembolic phenomena.  [name] was started on apixaban.  14-day event monitor was placed to assess his atrial fibrillation burden.  He was also transitioned from oral clonidine to clonidine patch.  Unfortunately, he was hospitalized a week later with acute on chronic heart failure and acute respiratory failure with hypoxia.  Poorly controlled blood pressure remained a problem during his hospitalization.  He was also noted to be in atrial fibrillation with rapid ventricular response on admission but converted to sinus rhythm.  He was discharged on amiodarone and continued on apixaban.  Today, [name] reports that he is feeling better, close to his baseline.  He has not had any significant shortness of breath.  He also denies chest pain and palpitations.  He has occasional orthostatic lightheadedness that only last for a few seconds.  He has noticed that changing his dialysis fluid sometimes  causes him to gain or lose weight overnight.  He has not had any bleeding, remaining on apixaban.  Home blood pressures have overall been better with readings as low as 133/50.  However, he prefers his blood pressure to be closer to 160/60 as he otherwise does not feel so well when it is lower.  In regard to pain and swelling in his breasts, [name] first noticed this a few months ago.  It has improved on its own and is barely noticeable at this point.  [name] was evaluated yesterday by Dr. Derrel Nip with plans for morning labs at his convenience to assess hormone levels.  --------------------------------------------------------------------------------------------------  Past Medical History:  Diagnosis Date   Acute on chronic systolic congestive heart failure [facility]    Adenocarcinoma of appendix [facility]) Jan 2006   right kidney, s/p cryoablation   Atrial fibrillation with rapid ventricular response ([place]) 05/12/2020   Cardiomyopathy ([place])    a. 12/2018 Echo: EF 40-45%, global HK. Asc Ao 3.7cm; b. 01/2019 Lexi MV: small, mild, fixed basal and mid antlat defect - scar vs artifact. Small, mild mid and apical inf minimally reversible defect, likely scar w/ peri-infarct ischemia. Coronary and Ao atherosclerosis; c. 12/2019 Echo: EF 40-45%, glob HK, Gr1 DD. Nl RV fxn. Sev dil LA. Mild MR. Mild-mod Ao sclerosis w/o stenosis. Asc Ao 61mm.   Carotid arterial disease ([place])    a. 01/2020 RICA 1-39%, RCCA/RECA < 67%, LICA 8-93%, LCCA/LECA <50%.   Claudication ([place])    a. 02/2020 ABI/TBI: R 1.08/0.95, L 1.00/0.70.   Complication of anesthesia    had to be woken up slowly as his bp was elevated when did this quickly   Diabetes mellitus without complication (HCC)  ESRD (Julea Hutto stage renal disease) ([place])    a. Peritoneal Dialysis pt.   Hyperlipidemia    Hypertension    Migraine    cluster   Neuromuscular disorder ([place])    left lower extrem neuropathy   Obstructive sleep apnea    no OSA since had facial surgery  with dr. Kathyrn Sheriff in 1997   PAD (peripheral artery disease) [facility]) Feb 2009   nonobstructing, renal angiogram [name]   Renal cell carcinoma 2004   left kidney heminephrectomy   Renal insufficiency    Sepsis ([place]) 11/06/2020   Stroke ([place])    a. 12/2019 MRI: Small acute infarcts involving the left cerebral hemisphere and several chronic infarcts.   TIA (transient ischemic attack)    no residual but left leg and foot still feel heavy   tobacco abuse    Past Surgical History:  Procedure Laterality Date   CAPD INSERTION N/A 03/01/2019   Procedure: LAPAROSCOPIC INSERTION CONTINUOUS AMBULATORY PERITONEAL DIALYSIS  (CAPD) CATHETER;  Surgeon: Algernon Huxley, MD;  Location: [facility] ORS;  Service: Vascular;  Laterality: N/A;   CARDIAC CATHETERIZATION     Dr. [name] did this to assess his renal artery   cyst removal  12/25/2015   Spine L4 and L5   heminephrectomy  2004   for renal cell CA   RENAL CRYOABLATION  Jan 2006   right kidney,  [name]   sciatica      Current Meds  Medication Sig   albuterol (VENTOLIN HFA) 108 (90 Base) MCG/ACT inhaler Inhale 2 puffs into the lungs every 6 (six) hours as needed for wheezing or shortness of breath.   amiodarone (PACERONE) 200 MG tablet Take 2 tablets twice daily for 7 days (until 04/11/21), then reduce to 1 tablet twice daily (starting 04/12/21).   amLODipine (NORVASC) 10 MG tablet TAKE 1 TABLET BY MOUTH ONCE DAILY   apixaban (ELIQUIS) 5 MG TABS tablet Take 1 tablet (5 mg total) by mouth 2 (two) times daily.   Ascorbic Acid 500 MG CHEW Chew 1 tablet by mouth daily.   b complex vitamins capsule Take 1 capsule by mouth daily.   bisoprolol (ZEBETA) 5 MG tablet Take 1 tablet (5 mg total) by mouth daily.   calcitRIOL (ROCALTROL) 0.25 MCG capsule Take 0.25 mcg by mouth every Monday, Wednesday, and Friday.   cloNIDine (CATAPRES - DOSED IN MG/24 HR) 0.1 mg/24hr patch Place 1 patch (0.1 mg total) onto the skin every Friday.   diphenhydrAMINE (BENADRYL) 25 MG tablet  Take 25 mg by mouth every 6 (six) hours as needed for allergies.   doxazosin (CARDURA) 8 MG tablet Take 8 mg by mouth daily.   fluticasone (FLONASE) 50 MCG/ACT nasal spray Place 2 sprays into both nostrils daily as needed for allergies or rhinitis.   gentamicin cream (GARAMYCIN) 0.1 % Apply 1 application topically daily.   isosorbide mononitrate (IMDUR) 60 MG 24 hr tablet Take 1 tablet (60 mg total) by mouth daily.   losartan (COZAAR) 100 MG tablet Take 100 mg by mouth daily.   multivitamin (RENA-VIT) TABS tablet Take 1 tablet by mouth at bedtime.   rosuvastatin (CRESTOR) 20 MG tablet TAKE 1 TABLET BY MOUTH AT BEDTIME   sevelamer (RENAGEL) 800 MG tablet Take 800 mg by mouth 3 (three) times daily with meals.   torsemide (DEMADEX) 100 MG tablet Take 100 mg by mouth daily.    Allergies: Irbesartan, Cymbalta [duloxetine hcl], Hydralazine, Imdur [isosorbide nitrate], Atorvastatin, and Bystolic [nebivolol hcl]  Social History   Tobacco  Use   Smoking status: Every Day    Packs/day: 1.00    Years: 50.00    Pack years: 50.00    Types: Cigarettes   Smokeless tobacco: Never  Vaping Use   Vaping Use: Former   Devices: tried but did not like  Substance Use Topics   Alcohol use: Not Currently   Drug use: No    Family History  Problem Relation Age of Onset   Hypertension Mother    Cancer Mother        breast   Aneurysm Mother    Coronary artery disease Father    Hypertension Father    Stroke Father 16   Heart disease Father    Heart attack Father 31   Aneurysm Maternal Grandmother        brain   Aneurysm Paternal Grandmother        brain   Coronary artery disease Paternal Grandfather    Heart disease Brother        valvular heart disease   COPD Brother    Hypertension Brother    Stroke Paternal Uncle     Review of Systems: A 12-system review of systems was performed and was negative except as noted in the  HPI.  --------------------------------------------------------------------------------------------------  Physical Exam: BP (!) 180/70 (BP Location: Left Arm, Patient Position: Sitting, Cuff Size: Normal)   Pulse (!) 46   Ht 5\' 6"  (1.676 m)   Wt 146 lb (66.2 kg)   SpO2 98%   BMI 23.57 kg/m   General:  NAD. Neck: No JVD or HJR. Lungs: Clear to auscultation bilaterally without wheezes or crackles. Heart: Bradycardic but regular with 1/6 systolic murmur. Abdomen: Soft, nontender, nondistended. Extremities: No lower extremity edema.  EKG: Sinus bradycardia with left axis deviation, LVH, and anteroseptal infarct.  Heart rate has decreased since 04/03/2021.  Otherwise, no significant interval change.  Lab Results  Component Value Date   WBC 11.0 (H) 04/04/2021   HGB 8.8 (L) 04/04/2021   HCT 27.3 (L) 04/04/2021   MCV 92.2 04/04/2021   PLT 153 04/04/2021    Lab Results  Component Value Date   NA 140 04/04/2021   K 3.8 04/04/2021   CL 108 04/04/2021   CO2 24 04/04/2021   BUN 64 (H) 04/04/2021   CREATININE 8.05 (H) 04/04/2021   GLUCOSE 140 (H) 04/04/2021   ALT 14 02/27/2021    Lab Results  Component Value Date   CHOL 122 05/12/2020   HDL 25 (L) 05/12/2020   LDLCALC 61 05/12/2020   LDLDIRECT 134.0 05/11/2017   TRIG 180 (H) 05/12/2020   CHOLHDL 4.9 05/12/2020    --------------------------------------------------------------------------------------------------  ASSESSMENT AND PLAN: Chronic HFrEF: Mr. Agena appears euvolemic on exam today.  His weight is stable since leaving the hospital.  I encouraged him to continue taking torsemide 100 mg daily and dialyzing in such a way that he avoids overnight weight gains.  Continue current doses of bisoprolol and losartan.  If he were to have worsening heart failure symptoms, I think we would need to consider right and left heart catheterization to definitively assess his coronary arteries as well as better understand his  hemodynamics.  Paroxysmal atrial fibrillation: Mr. Matters maintains sinus rhythm today and is somewhat bradycardic with occasional orthostatic lightheadedness.  We have agreed to decrease amiodarone to 200 mg daily.  Continue apixaban 5 mg twice daily as long as he does not have any bleeding.  Mr. Hilburn should follow-up as previously scheduled with Dr. Quentin Ore.  Hypertension:  Blood pressure not well controlled today but typically better at home.  Unfortunately, Mr. Molder is quite symptomatic with even upper normal blood pressures.  We discussed adjustments to his regimen but have agreed to defer this.  Zachari Alberta-stage renal disease: Continue peritoneal dialysis, as directed by nephrology.  Gynecomastia: Minimal tenderness/breast swelling reported by Mr. Carrington today.  Defer ongoing work-up to Dr. Derrel Nip.  Follow-up: Return to clinic in 3 months.  [name], MD 04/23/2021 11:06 AM

## 2021-04-29 DIAGNOSIS — Z992 Dependence on renal dialysis: Secondary | ICD-10-CM | POA: Diagnosis not present

## 2021-05-01 DIAGNOSIS — Z9889 Other specified postprocedural states: Secondary | ICD-10-CM | POA: Diagnosis not present

## 2021-05-01 DIAGNOSIS — M25552 Pain in left hip: Secondary | ICD-10-CM | POA: Diagnosis not present

## 2021-05-01 DIAGNOSIS — M25551 Pain in right hip: Secondary | ICD-10-CM | POA: Diagnosis not present

## 2021-05-01 DIAGNOSIS — M5416 Radiculopathy, lumbar region: Secondary | ICD-10-CM | POA: Diagnosis not present

## 2021-05-01 DIAGNOSIS — M7138 Other bursal cyst, other site: Secondary | ICD-10-CM | POA: Diagnosis not present

## 2021-05-01 DIAGNOSIS — M1611 Unilateral primary osteoarthritis, right hip: Secondary | ICD-10-CM | POA: Diagnosis not present

## 2021-05-05 NOTE — Progress Notes (Signed)
Electrophysiology Office Follow up Visit Note:    Date:  05/06/2021   ID:  [name], DOB 1945-09-28, MRN [id number]  PCP:  Brett Mc, MD  [facility] Cardiologist:  [name], MD  [facility] Electrophysiologist:  Vickie Epley, MD    Interval History:    [name] is a 75 y.o. male who presents for a follow up visit.  I last saw the patient March 25, 2021 for atrial fibrillation.  His medical history also includes end-stage renal disease on peritoneal dialysis, chronic systolic heart failure, hypertension, hyperlipidemia, PAD, OSA, RCC status post nephrectomy, tobacco abuse, stroke and adenocarcinoma of the appendix.  At the time that I last saw him he was not taking a blood thinner because of a history of GI bleeding.  We discussed his risk of rebleeding and after our discussion he decided to restart his anticoagulation.  He used Eliquis.  He also had uncontrolled at the last clinic visit.  I had him follow-up with me in 6 to 8 weeks.  Heart monitor was planned in an effort to assess his burden of atrial fibrillation flutter.  Today he tells me that he feels the best recently that he has felt in a long while.  He is tolerating his anticoagulant.     Past Medical History:  Diagnosis Date   Acute on chronic systolic congestive heart failure [facility])    Adenocarcinoma of appendix [name]) Jan 2006   right kidney, s/p cryoablation   Atrial fibrillation with rapid ventricular response ([name]) 05/12/2020   Cardiomyopathy ([place])    a. 12/2018 Echo: EF 40-45%, global HK. Asc Ao 3.7cm; b. 01/2019 Lexi MV: small, mild, fixed basal and mid antlat defect - scar vs artifact. Small, mild mid and apical inf minimally reversible defect, likely scar w/ peri-infarct ischemia. Coronary and Ao atherosclerosis; c. 12/2019 Echo: EF 40-45%, glob HK, Gr1 DD. Nl RV fxn. Sev dil LA. Mild MR. Mild-mod Ao sclerosis w/o stenosis. Asc Ao 48mm.   Carotid arterial disease ([name])    a. 01/2020  RICA 1-39%, RCCA/RECA < 50%, LICA 0-37%, LCCA/LECA <50%.   Claudication ([place])    a. 02/2020 ABI/TBI: R 1.08/0.95, L 1.00/0.70.   Complication of anesthesia    had to be woken up slowly as his bp was elevated when did this quickly   Diabetes mellitus without complication (HCC)    ESRD (end stage renal disease) ([place])    a. Peritoneal Dialysis pt.   Hyperlipidemia    Hypertension    Migraine    cluster   Neuromuscular disorder ([name])    left lower extrem neuropathy   Obstructive sleep apnea    no OSA since had facial surgery with dr. Kathyrn Sheriff in 1997   PAD (peripheral artery disease) [facility]) Feb 2009   nonobstructing, renal angiogram [name]   Renal cell carcinoma 2004   left kidney heminephrectomy   Renal insufficiency    Sepsis ([name]) 11/06/2020   Stroke ([name])    a. 12/2019 MRI: Small acute infarcts involving the left cerebral hemisphere and several chronic infarcts.   TIA (transient ischemic attack)    no residual but left leg and foot still feel heavy   tobacco abuse     Past Surgical History:  Procedure Laterality Date   CAPD INSERTION N/A 03/01/2019   Procedure: LAPAROSCOPIC INSERTION CONTINUOUS AMBULATORY PERITONEAL DIALYSIS  (CAPD) CATHETER;  Surgeon: Algernon Huxley, MD;  Location: [facility] ORS;  Service: Vascular;  Laterality: N/A;   CARDIAC CATHETERIZATION  Dr. [name] did this to assess his renal artery   cyst removal  12/25/2015   Spine L4 and L5   heminephrectomy  2004   for renal cell CA   RENAL CRYOABLATION  Jan 2006   right kidney,  [name]   sciatica      Current Medications: Current Meds  Medication Sig   albuterol (VENTOLIN HFA) 108 (90 Base) MCG/ACT inhaler Inhale 2 puffs into the lungs every 6 (six) hours as needed for wheezing or shortness of breath.   amiodarone (PACERONE) 200 MG tablet Take 1 tablet (200 mg total) by mouth daily.   amLODipine (NORVASC) 10 MG tablet TAKE 1 TABLET BY MOUTH ONCE DAILY   apixaban (ELIQUIS) 5 MG TABS tablet Take 1 tablet (5 mg  total) by mouth 2 (two) times daily.   Ascorbic Acid 500 MG CHEW Chew 1 tablet by mouth daily.   b complex vitamins capsule Take 1 capsule by mouth daily.   calcitRIOL (ROCALTROL) 0.25 MCG capsule Take 0.25 mcg by mouth every Monday, Wednesday, and Friday.   cloNIDine (CATAPRES - DOSED IN MG/24 HR) 0.1 mg/24hr patch Place 1 patch (0.1 mg total) onto the skin every Friday.   diphenhydrAMINE (BENADRYL) 25 MG tablet Take 25 mg by mouth every 6 (six) hours as needed for allergies.   doxazosin (CARDURA) 8 MG tablet Take 8 mg by mouth daily.   fluticasone (FLONASE) 50 MCG/ACT nasal spray Place 2 sprays into both nostrils daily as needed for allergies or rhinitis.   gentamicin cream (GARAMYCIN) 0.1 % Apply 1 application topically daily.   losartan (COZAAR) 100 MG tablet Take 100 mg by mouth daily.   multivitamin (RENA-VIT) TABS tablet Take 1 tablet by mouth at bedtime.   rosuvastatin (CRESTOR) 20 MG tablet TAKE 1 TABLET BY MOUTH AT BEDTIME   sevelamer (RENAGEL) 800 MG tablet Take 800 mg by mouth 3 (three) times daily with meals.   torsemide (DEMADEX) 100 MG tablet Take 100 mg by mouth daily.     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  Tobacco Use   Smoking status: Every Day    Packs/day: 1.00    Years: 50.00    Pack years: 50.00    Types: Cigarettes   Smokeless tobacco: Never  Vaping Use   Vaping Use: Former   Devices: tried but did not like  Substance and Sexual Activity   Alcohol use: Not Currently   Drug use: No   Sexual activity: Yes  Other Topics Concern   Not on file  Social History Narrative   Not on file    Social Determinants of Health   Financial Resource Strain: Low Risk    Difficulty of Paying Living Expenses: Not hard at all  Food Insecurity: No Food Insecurity   Worried About Charity fundraiser in the Last Year: Never true   [place] in the Last Year: Never true  Transportation Needs: No Transportation Needs   Lack of Transportation (Medical): No   Lack of Transportation (Non-Medical): No  Physical Activity: Insufficiently Active   Days of Exercise per Week: 3 days   Minutes  of Exercise per Session: 20 min  Stress: No Stress Concern Present   Feeling of Stress : Not at all  Social Connections: Unknown   Frequency of Communication with Friends and Family: Not on file   Frequency of Social Gatherings with Friends and Family: Not on file   Attends Religious Services: Not on Electrical engineer or Organizations: Not on file   Attends Archivist Meetings: Not on file   Marital Status: Married     Family History: The patient's family history includes Aneurysm in his maternal grandmother, mother, and paternal grandmother; COPD in his brother; Cancer in his mother; Coronary artery disease in his father and paternal grandfather; Heart attack (age of onset: 32) in his father; Heart disease in his brother and father; Hypertension in his brother, father, and mother; Stroke in his paternal uncle; Stroke (age of onset: 52) in his father.  ROS:   Please see the history of present illness.    All other systems reviewed and are negative.  EKGs/Labs/Other Studies Reviewed:    The following studies were reviewed today:   April 15, 2021 ZIO monitor personally reviewed HR 36 - 190bpm, average 67bpm. 3 VT episodes, longest lasting 8 beats. 1336 SVT episodes, longest lasting 18.8 seconds with an average rate of 151 bpm. 4% atrial flutter burden 1 pause lasting 3.3 seconds Rare supraventricular and ventricular ectopy.    EKG:  The ekg ordered today  demonstrates sinus rhythm.  LVH.  Recent Labs: 08/06/2020: B Natriuretic Peptide 1,785.3 02/27/2021: ALT 14 04/02/2021: TSH 1.058 04/04/2021: BUN 64; Creatinine, Ser 8.05; Hemoglobin 8.8; Magnesium 2.4; Platelets 153; Potassium 3.8; Sodium 140  Recent Lipid Panel    Component Value Date/Time   CHOL 122 05/12/2020 1238   CHOL 159 01/04/2012 0428   TRIG 180 (H) 05/12/2020 1238   TRIG 218 (H) 01/04/2012 0428   HDL 25 (L) 05/12/2020 1238   HDL 17 (L) 01/04/2012 0428   CHOLHDL 4.9 05/12/2020 1238   VLDL 36 05/12/2020 1238   VLDL 44 (H) 01/04/2012 0428   LDLCALC 61 05/12/2020 1238   LDLCALC 98 01/04/2012 0428   LDLDIRECT 134.0 05/11/2017 1450    Physical Exam:    VS:  BP 120/70    Pulse 65    Ht 5\' 6"  (1.676 m)    Wt 149 lb 9.6 oz (67.9 kg)    SpO2 97%    BMI 24.15 kg/m     Wt Readings from Last 3 Encounters:  05/06/21 149 lb 9.6 oz (67.9 kg)  04/23/21 146 lb (66.2 kg)  04/21/21 147 lb 9.6 oz (67 kg)     GEN:  Well nourished, well developed in no acute distress HEENT: Normal NECK: No JVD; No carotid bruits LYMPHATICS: No lymphadenopathy CARDIAC: RRR, no murmurs, rubs, gallops RESPIRATORY:  Clear to auscultation without rales, wheezing or rhonchi  ABDOMEN: Soft, non-tender, non-distended MUSCULOSKELETAL:  No edema; No deformity  SKIN: Warm and dry NEUROLOGIC:  Alert and oriented x 3 PSYCHIATRIC:  Normal affect        ASSESSMENT:    1. Paroxysmal atrial fibrillation (HCC)   2. Chronic HFrEF (heart failure with reduced ejection fraction) ([place])   3. Gastrointestinal hemorrhage, unspecified gastrointestinal hemorrhage type   4. ESRD (end stage renal disease) ([facility])    PLAN:    In order of problems listed above:  #Atrial fibrillation, paroxysmal Rhythm control is indicated given his history of chronic systolic heart failure.  Tolerating the amiodarone.  Has  had recent blood work which shows stable kidney and thyroid function and liver function.  We will plan to see him  back in about 6 months with repeat blood work at that visit (CMP, TSH and free T4).  #Chronic systolic heart failure NYHA class II today.  Warm and dry on exam.  Continue current therapy including bisoprolol, Imdur, losartan, torsemide.  #History of GI bleeding Tolerating Eliquis without bleeding issues.   Follow-up 6 months with APP.  Blood work at that visit.    Medication Adjustments/Labs and Tests Ordered: Current medicines are reviewed at length with the patient today.  Concerns regarding medicines are outlined above.  Orders Placed This Encounter  Procedures   EKG 12-Lead    No orders of the defined types were placed in this encounter.    Signed, Lars Mage, MD, [facility], [facility] 05/06/2021 11:03 AM    Electrophysiology [facility]

## 2021-05-06 ENCOUNTER — Encounter: Payer: Self-pay | Admitting: Cardiology

## 2021-05-06 ENCOUNTER — Ambulatory Visit (INDEPENDENT_AMBULATORY_CARE_PROVIDER_SITE_OTHER): Payer: Medicare Other | Admitting: Cardiology

## 2021-05-06 ENCOUNTER — Other Ambulatory Visit: Payer: Self-pay

## 2021-05-06 VITALS — BP 120/70 | HR 65 | Ht 66.0 in | Wt 149.6 lb

## 2021-05-06 DIAGNOSIS — K922 Gastrointestinal hemorrhage, unspecified: Secondary | ICD-10-CM | POA: Diagnosis not present

## 2021-05-06 DIAGNOSIS — I480 Paroxysmal atrial fibrillation: Secondary | ICD-10-CM | POA: Diagnosis not present

## 2021-05-06 DIAGNOSIS — N186 End stage renal disease: Secondary | ICD-10-CM

## 2021-05-06 DIAGNOSIS — I5022 Chronic systolic (congestive) heart failure: Secondary | ICD-10-CM | POA: Diagnosis not present

## 2021-05-06 NOTE — Patient Instructions (Addendum)
Medication Instructions:  Your physician recommends that you continue on your current medications as directed. Please refer to the Current Medication list given to you today. *If you need a refill on your cardiac medications before your next appointment, please call your pharmacy*  Lab Work: None ordered. If you have labs (blood work) drawn today and your tests are completely normal, you will receive your results only by: [place] (if you have MyChart) OR A paper copy in the mail If you have any lab test that is abnormal or we need to change your treatment, we will call you to review the results.  Testing/Procedures: None ordered.  Follow-Up: At [facility], you and your health needs are our priority.  As part of our continuing mission to provide you with exceptional heart care, we have created designated Provider Care Teams.  These Care Teams include your primary Cardiologist (physician) and Advanced Practice Providers (APPs -  Physician Assistants and Nurse Practitioners) who all work together to provide you with the care you need, when you need it.  Your next appointment:   Your physician wants you to follow-up in: 6  months with one of the following Advanced Practice Providers on your designated Care Team:   Kaster Hodgkins, NP Christell Faith, PA-C Cadence Kathlen Mody, PA-C You will receive a reminder letter in the mail two months in advance. If you don't receive a letter, please call our office to schedule the follow-up appointment.

## 2021-05-11 ENCOUNTER — Telehealth: Payer: Self-pay | Admitting: Urology

## 2021-05-11 DIAGNOSIS — N281 Cyst of kidney, acquired: Secondary | ICD-10-CM

## 2021-05-11 DIAGNOSIS — Z85528 Personal history of other malignant neoplasm of kidney: Secondary | ICD-10-CM

## 2021-05-11 NOTE — Telephone Encounter (Signed)
Yes, he would like to schedule MRI.

## 2021-05-11 NOTE — Telephone Encounter (Signed)
At September 2022 visit he had had a CT in the ED to 2022 which showed renal lesions for which a follow-up MRI was recommended.  The order was placed but never scheduled.  Please check with patient to see if he is agreeable to get this MRI done.

## 2021-05-11 NOTE — Addendum Note (Signed)
Addended by: Abbie Sons on: 05/11/2021 10:44 AM   Modules accepted: Orders

## 2021-05-21 ENCOUNTER — Other Ambulatory Visit: Payer: Self-pay

## 2021-05-21 ENCOUNTER — Other Ambulatory Visit (INDEPENDENT_AMBULATORY_CARE_PROVIDER_SITE_OTHER): Payer: Medicare Other

## 2021-05-21 ENCOUNTER — Telehealth: Payer: Self-pay | Admitting: *Deleted

## 2021-05-21 DIAGNOSIS — N62 Hypertrophy of breast: Secondary | ICD-10-CM

## 2021-05-21 LAB — COMPREHENSIVE METABOLIC PANEL
ALT: 11 U/L (ref 0–53)
AST: 9 U/L (ref 0–37)
Albumin: 3.1 g/dL — ABNORMAL LOW (ref 3.5–5.2)
Alkaline Phosphatase: 99 U/L (ref 39–117)
BUN: 58 mg/dL — ABNORMAL HIGH (ref 6–23)
CO2: 27 mEq/L (ref 19–32)
Calcium: 8.5 mg/dL (ref 8.4–10.5)
Chloride: 101 mEq/L (ref 96–112)
Creatinine, Ser: 8.52 mg/dL (ref 0.40–1.50)
GFR: 5.63 mL/min — CL (ref >60.00)
Glucose, Bld: 205 mg/dL — ABNORMAL HIGH (ref 70–99)
Potassium: 4.4 mEq/L (ref 3.5–5.1)
Sodium: 138 mEq/L (ref 135–145)
Total Bilirubin: 0.5 mg/dL (ref 0.2–1.2)
Total Protein: 5.8 g/dL — ABNORMAL LOW (ref 6.0–8.3)

## 2021-05-21 LAB — TESTOSTERONE: Testosterone: 278.67 ng/dL — ABNORMAL LOW (ref 300.00–890.00)

## 2021-05-21 LAB — LUTEINIZING HORMONE: LH: 71.15 m[IU]/mL — ABNORMAL HIGH (ref 3.10–34.60)

## 2021-05-21 NOTE — Telephone Encounter (Signed)
CRITICAL VALUE STICKER  CRITICAL VALUE: Creatinine: 8.52/GFR: 5.63  RECEIVER (on-site recipient of call): Jari Favre, CMA/XT  DATE & TIME NOTIFIED: 05/21/21 @ 3:25pm  MESSENGER (representative from lab): Hope  MD NOTIFIED: Dr. Derrel Nip  TIME OF NOTIFICATION: 3:25pm  RESPONSE:

## 2021-05-22 DIAGNOSIS — M5416 Radiculopathy, lumbar region: Secondary | ICD-10-CM | POA: Diagnosis not present

## 2021-05-22 DIAGNOSIS — M6281 Muscle weakness (generalized): Secondary | ICD-10-CM | POA: Diagnosis not present

## 2021-05-23 DIAGNOSIS — N186 End stage renal disease: Secondary | ICD-10-CM | POA: Diagnosis not present

## 2021-05-23 DIAGNOSIS — Z992 Dependence on renal dialysis: Secondary | ICD-10-CM | POA: Diagnosis not present

## 2021-05-26 LAB — HCG, SERUM, QUALITATIVE: Preg, Serum: NEGATIVE

## 2021-05-26 LAB — TESTOSTERONE, FREE: TESTOSTERONE FREE: 34.6 pg/mL (ref 6.0–73.0)

## 2021-05-26 LAB — SEX HORMONE BINDING GLOBULIN: Sex Hormone Binding: 59 nmol/L (ref 22–77)

## 2021-05-26 LAB — ESTRADIOL: Estradiol: 21 pg/mL (ref <=39)

## 2021-05-28 ENCOUNTER — Ambulatory Visit: Admission: RE | Admit: 2021-05-28 | Payer: Medicare Other | Source: Ambulatory Visit

## 2021-06-04 ENCOUNTER — Ambulatory Visit: Payer: Medicare Other | Admitting: Internal Medicine

## 2021-06-06 LAB — TESTOSTERONE, % FREE: Testosterone-% Free: 0.9 %

## 2021-06-17 ENCOUNTER — Other Ambulatory Visit: Payer: Self-pay

## 2021-06-17 ENCOUNTER — Ambulatory Visit: Payer: Medicare PPO | Attending: Family | Admitting: Family

## 2021-06-17 ENCOUNTER — Encounter: Payer: Self-pay | Admitting: Family

## 2021-06-17 VITALS — BP 190/70 | HR 99 | Resp 18 | Ht 66.0 in | Wt 153.1 lb

## 2021-06-17 DIAGNOSIS — I10 Essential (primary) hypertension: Secondary | ICD-10-CM | POA: Diagnosis not present

## 2021-06-17 DIAGNOSIS — I2510 Atherosclerotic heart disease of native coronary artery without angina pectoris: Secondary | ICD-10-CM | POA: Diagnosis not present

## 2021-06-17 DIAGNOSIS — Z7901 Long term (current) use of anticoagulants: Secondary | ICD-10-CM | POA: Diagnosis not present

## 2021-06-17 DIAGNOSIS — I132 Hypertensive heart and chronic kidney disease with heart failure and with stage 5 chronic kidney disease, or end stage renal disease: Secondary | ICD-10-CM | POA: Diagnosis present

## 2021-06-17 DIAGNOSIS — F17210 Nicotine dependence, cigarettes, uncomplicated: Secondary | ICD-10-CM | POA: Diagnosis not present

## 2021-06-17 DIAGNOSIS — N186 End stage renal disease: Secondary | ICD-10-CM | POA: Diagnosis not present

## 2021-06-17 DIAGNOSIS — I4891 Unspecified atrial fibrillation: Secondary | ICD-10-CM | POA: Insufficient documentation

## 2021-06-17 DIAGNOSIS — Z79899 Other long term (current) drug therapy: Secondary | ICD-10-CM | POA: Insufficient documentation

## 2021-06-17 DIAGNOSIS — Z85528 Personal history of other malignant neoplasm of kidney: Secondary | ICD-10-CM | POA: Insufficient documentation

## 2021-06-17 DIAGNOSIS — Z992 Dependence on renal dialysis: Secondary | ICD-10-CM | POA: Diagnosis not present

## 2021-06-17 DIAGNOSIS — G4733 Obstructive sleep apnea (adult) (pediatric): Secondary | ICD-10-CM | POA: Diagnosis not present

## 2021-06-17 DIAGNOSIS — Z8673 Personal history of transient ischemic attack (TIA), and cerebral infarction without residual deficits: Secondary | ICD-10-CM | POA: Diagnosis not present

## 2021-06-17 DIAGNOSIS — E785 Hyperlipidemia, unspecified: Secondary | ICD-10-CM | POA: Diagnosis not present

## 2021-06-17 DIAGNOSIS — E1151 Type 2 diabetes mellitus with diabetic peripheral angiopathy without gangrene: Secondary | ICD-10-CM | POA: Diagnosis not present

## 2021-06-17 DIAGNOSIS — E1122 Type 2 diabetes mellitus with diabetic chronic kidney disease: Secondary | ICD-10-CM | POA: Diagnosis not present

## 2021-06-17 DIAGNOSIS — I5022 Chronic systolic (congestive) heart failure: Secondary | ICD-10-CM | POA: Diagnosis not present

## 2021-06-17 DIAGNOSIS — I480 Paroxysmal atrial fibrillation: Secondary | ICD-10-CM

## 2021-06-17 NOTE — Patient Instructions (Addendum)
Continue weighing daily and call for an overnight weight gain of 3 pounds or more or a weekly weight gain of more than 5 pounds.   The [facility] will be moving around the corner to [id number] mid-February. Our phone number will remain the same   Call us in the future if you need [name] for anything

## 2021-06-17 NOTE — Progress Notes (Signed)
Patient ID: [name], male    DOB: Sep 28, 1945, 77 y.o.   MRN: [id number]  HPI  [name] is a 76 y/o male with a history of renal cancer, carotid artery disease, DM, hyperlipidemia, HTN, CKD (on peritoneal dialysis), stroke, atrial fibrillation, sleep apnea, PAD, current tobacco use and chronic heart failure.   Echo report from 01/27/21 reviewed and showed an EF of 45-50% along with moderate LVH, mild/moderate LAE and mild Brett.   Admitted 04/01/21 due to shortness of breath. Placed on furosemide infusion with transition to oral diuretics. Cardiology consult obtained. Placed on diltiazem drip due to AF but then converted back to NSR and placed on oral amiodarone. Continued peritoneal dialysis. Discharged after 3 days.   He presents today for a follow-up visit with a chief complaint of moderate fatigue with little exertion. He describes this as chronic in nature having been present for several years. He has associated cough, shortness of breath, palpitations, light-headedness, sinus congestion and fluctuating weight along with this. He denies any difficulty sleeping, abdominal distention, pedal edema or chest pain.   Does nightly peritoneal dialysis for ~6 hours a night. Says that he's having difficulty with the dialysate as he feels like it's "too much and he fills up with fluid". Is supposed to be using 2.5 solution but he's using 1.5 instead with the 2.5 being used ~ twice/ week. Has appointment with nephrology tomorrow to discuss further.   Does report issues with his BP dropping especially when he stands. Says that when it's "normal", he feels weak and very fatigued.   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 12/2018 Echo: EF 40-45%, global HK. Asc Ao 3.7cm;  b. 01/2019 Lexi MV: small, mild, fixed basal and mid antlat defect - scar vs artifact. Small, mild mid and apical inf minimally reversible defect, likely scar w/ peri-infarct ischemia. Coronary and Ao atherosclerosis; c. 12/2019 Echo: EF 40-45%, glob HK, Gr1 DD. Nl RV fxn. Sev dil LA. Mild Brett. Mild-mod Ao sclerosis w/o stenosis. Asc Ao 54mm.   Carotid arterial disease ([place])    a. 01/2020 RICA 1-39%, RCCA/RECA < 06%, LICA 3-01%, LCCA/LECA <50%.   Claudication ([place])    a. 02/2020 ABI/TBI: R 1.08/0.95, L 1.00/0.70.   Complication of anesthesia    had to be woken up slowly as his bp was elevated when did this quickly   Diabetes mellitus without complication (HCC)    ESRD (end stage renal disease) ([place])    a. Peritoneal Dialysis pt.   Hyperlipidemia    Hypertension    Migraine    cluster   Neuromuscular disorder ([place])    left lower extrem neuropathy   Obstructive sleep apnea    no OSA since had facial surgery with dr. Kathyrn Sheriff in 1997   PAD (peripheral artery disease) [facility]) Feb 2009   nonobstructing, renal angiogram [name])   Renal cell carcinoma 2004   left kidney heminephrectomy   Renal insufficiency    Sepsis ([place]) 11/06/2020   Stroke ([place])    a. 12/2019 MRI: Small acute infarcts involving the left cerebral hemisphere and several chronic infarcts.   TIA (transient ischemic attack)    no residual but left leg and foot still feel heavy   tobacco abuse  Past Surgical History:  Procedure Laterality Date   CAPD INSERTION N/A 03/01/2019   Procedure: LAPAROSCOPIC INSERTION CONTINUOUS AMBULATORY PERITONEAL DIALYSIS  (CAPD) CATHETER;  Surgeon: Brett Huxley, MD;  Location: [facility] ORS;  Service: Vascular;  Laterality: N/A;   CARDIAC CATHETERIZATION     Dr. Fletcher Torres did this to assess his renal artery   cyst removal  12/25/2015   Spine L4 and L5   heminephrectomy  2004   for renal cell CA   RENAL CRYOABLATION  Jan 2006   right kidney,  [name]   sciatica     Family History  Problem Relation Age  of Onset   Hypertension Mother    Cancer Mother        breast   Aneurysm Mother    Coronary artery disease Father    Hypertension Father    Stroke Father 64   Heart disease Father    Heart attack Father 65   Aneurysm Maternal Grandmother        brain   Aneurysm Paternal Grandmother        brain   Coronary artery disease Paternal Grandfather    Heart disease Brother        valvular heart disease   COPD Brother    Hypertension Brother    Stroke Paternal Uncle    Social History   Tobacco Use   Smoking status: Every Day    Packs/day: 1.00    Years: 50.00    Pack years: 50.00    Types: Cigarettes   Smokeless tobacco: Never  Substance Use Topics   Alcohol use: Not Currently   Allergies  Allergen Reactions   Irbesartan Other (See Comments)    hyperkalemia   Cymbalta [Duloxetine Hcl]    Hydralazine Other (See Comments)    headache   Imdur [Isosorbide Nitrate] Other (See Comments)    headache   Atorvastatin Other (See Comments)    Muscle pain   Bystolic [Nebivolol Hcl]     Extreme fatigue    Prior to Admission medications   Medication Sig Start Date End Date Taking? Authorizing Provider  albuterol (VENTOLIN HFA) 108 (90 Base) MCG/ACT inhaler Inhale 2 puffs into the lungs every 6 (six) hours as needed for wheezing or shortness of breath. 03/25/21  Yes Vickie Epley, MD  amiodarone (PACERONE) 200 MG tablet Take 1 tablet (200 mg total) by mouth daily. 04/23/21 10/20/21 Yes End, Harrell Gave, MD  amLODipine (NORVASC) 10 MG tablet TAKE 1 TABLET BY MOUTH ONCE DAILY 03/23/21  Yes Crecencio Mc, MD  apixaban (ELIQUIS) 5 MG TABS tablet Take 1 tablet (5 mg total) by mouth 2 (two) times daily. 03/25/21  Yes Vickie Epley, MD  Ascorbic Acid 500 MG CHEW Chew 1 tablet by mouth daily.   Yes [provider]  b complex vitamins capsule Take 1 capsule by mouth daily.   Yes [provider]  bisoprolol (ZEBETA) 5 MG tablet Take 1 tablet (5 mg total) by mouth daily.  04/05/21  Yes Arrien, Jimmy Picket, MD  calcitRIOL (ROCALTROL) 0.25 MCG capsule Take 0.25 mcg by mouth every Monday, Wednesday, and Friday.   Yes [provider]  cloNIDine (CATAPRES - DOSED IN MG/24 HR) 0.1 mg/24hr patch Place 1 patch (0.1 mg total) onto the skin every Friday. 03/27/21  Yes Vickie Epley, MD  diphenhydrAMINE (BENADRYL) 25 MG tablet Take 25 mg by mouth every 6 (six) hours as needed for allergies.   Yes [provider]  doxazosin (CARDURA) 8 MG  tablet Take 8 mg by mouth daily. 03/29/20  Yes [provider]  fluticasone (FLONASE) 50 MCG/ACT nasal spray Place 2 sprays into both nostrils daily as needed for allergies or rhinitis.   Yes [provider]  gentamicin cream (GARAMYCIN) 0.1 % Apply 1 application topically daily. 11/07/20  Yes Fritzi Mandes, MD  isosorbide mononitrate (IMDUR) 60 MG 24 hr tablet Take 1 tablet (60 mg total) by mouth daily. 04/04/21  Yes Arrien, Jimmy Picket, MD  losartan (COZAAR) 100 MG tablet Take 100 mg by mouth daily. 02/19/21  Yes [provider]  multivitamin (RENA-VIT) TABS tablet Take 1 tablet by mouth at bedtime. 12/21/19  Yes [provider]  rosuvastatin (CRESTOR) 20 MG tablet TAKE 1 TABLET BY MOUTH AT BEDTIME 12/29/20  Yes Theora Gianotti, NP  sevelamer (RENAGEL) 800 MG tablet Take 800 mg by mouth 3 (three) times daily with meals.   Yes [provider]  torsemide (DEMADEX) 100 MG tablet Take 100 mg by mouth daily.   Yes [provider]    Review of Systems  Constitutional:  Positive for fatigue (easily). Negative for appetite change.  HENT:  Positive for congestion, postnasal drip and sinus pressure. Negative for sore throat.   Eyes: Negative.   Respiratory:  Positive for cough (productive) and shortness of breath (easily). Negative for chest tightness.   Cardiovascular:  Positive for palpitations (last week). Negative for chest pain and leg swelling.   Gastrointestinal:  Negative for abdominal distention and abdominal pain.  Endocrine: Negative.   Genitourinary: Negative.   Musculoskeletal:  Positive for arthralgias (right hip/leg). Negative for back pain and neck pain.  Skin: Negative.   Allergic/Immunologic: Negative.   Neurological:  Positive for light-headedness. Negative for dizziness.  Hematological:  Negative for adenopathy. Does not bruise/bleed easily.  Psychiatric/Behavioral:  Negative for dysphoric mood and sleep disturbance (sleeping on 1-2 pillows). The patient is not nervous/anxious.    Vitals:   06/17/21 1250 06/17/21 1259  BP: (!) 212/65 (!) 190/70  Pulse: 99   Resp: 18   SpO2: 99%   Weight: 153 lb 2 oz (69.5 kg)   Height: 5\' 6"  (1.676 m)    Wt Readings from Last 3 Encounters:  06/17/21 153 lb 2 oz (69.5 kg)  05/06/21 149 lb 9.6 oz (67.9 kg)  04/23/21 146 lb (66.2 kg)   Lab Results  Component Value Date   CREATININE 8.52 (HH) 05/21/2021   CREATININE 8.05 (H) 04/04/2021   CREATININE 7.98 (H) 04/03/2021   Physical Exam Vitals and nursing note reviewed.  Constitutional:      Appearance: He is well-developed.  HENT:     Head: Normocephalic and atraumatic.  Neck:     Vascular: No JVD.  Cardiovascular:     Rate and Rhythm: Normal rate and regular rhythm.  Pulmonary:     Effort: Pulmonary effort is normal. No accessory muscle usage.     Breath sounds: No wheezing, rhonchi or rales.  Abdominal:     Palpations: Abdomen is soft.     Tenderness: There is no abdominal tenderness.  Musculoskeletal:     Cervical back: Normal range of motion and neck supple.     Right lower leg: No tenderness. Edema (1+ pitting) present.     Left lower leg: No tenderness. Edema (trace) present.  Skin:    General: Skin is warm and dry.  Neurological:     General: No focal deficit present.     Mental Status: He is alert and oriented to  person, place, and time.  Psychiatric:        Mood and Affect: Mood normal.         Behavior: Behavior normal.    Assessment & Plan:  1: Chronic heart failure with mildly reduced ejection fraction with structural changes (LVH)- - NYHA class III - euvolemic today - already weighing daily & says that his weight has fluctuated a few ounces daily; reminded to call for an overnight weight gain of > 2 pounds or a weekly weight gain of > 5 pounds - weight up 5 pounds from last visit here 2 months ago - not adding salt  - saw cardiology (End) 04/23/21 - on GMDT of losartan and bisoprolol - renal function does not allow for MRA or SLGT2 - tries to keep daily fluid intake to 64 ounces/ day but doesn't measure it out - BNP 08/06/20 was 1785.3  2: HTN- - BP elevated (212/65 ) and even upon recheck with manual cuff (190/70) although he says that he hasn't taken any of his medications yet today (~ 1pm now); will be taking them upon his return home - saw PCP Derrel Nip) 04/21/21 - BMP from 05/21/21 reviewed and showed sodium 138, potassium 4.4, creatinine 8.52 and GFR 5.63  3: DM- - A1c 11/21/20 was 6.9%  4: ESRD- - on peritoneal dialysis 6 hours a night and says that he's decreased his dialysis solution from 2.5 down to 1.5 due to sensation of being full. Uses the 2.5 ~ twice a week. - has appointment with nephrology tomorrow to discuss - saw vascular ([place]) 02/17/21  5: Atrial fibrillation- - saw EP Quentin Ore) 05/06/21 - currently on amiodarone & apixaban   Medication list reviewed.   Due to HF stability, will not make a return appointment for patient at this time. Advised patient to follow closely with cardiology and nephrology but that he could call back at anytime for any questions/problems. He was comfortable with this plan.

## 2021-07-29 ENCOUNTER — Other Ambulatory Visit: Payer: Self-pay

## 2021-07-29 ENCOUNTER — Encounter: Payer: Self-pay | Admitting: Emergency Medicine

## 2021-07-29 ENCOUNTER — Emergency Department: Payer: Medicare PPO

## 2021-07-29 ENCOUNTER — Emergency Department
Admission: EM | Admit: 2021-07-29 | Discharge: 2021-07-29 | Disposition: A | Discharge status: Home / Self Care | Payer: Medicare PPO | Attending: Emergency Medicine | Admitting: Emergency Medicine

## 2021-07-29 DIAGNOSIS — Z992 Dependence on renal dialysis: Secondary | ICD-10-CM | POA: Insufficient documentation

## 2021-07-29 DIAGNOSIS — Z0189 Encounter for other specified special examinations: Secondary | ICD-10-CM

## 2021-07-29 DIAGNOSIS — N186 End stage renal disease: Secondary | ICD-10-CM | POA: Diagnosis not present

## 2021-07-29 DIAGNOSIS — I4892 Unspecified atrial flutter: Secondary | ICD-10-CM | POA: Insufficient documentation

## 2021-07-29 DIAGNOSIS — I509 Heart failure, unspecified: Secondary | ICD-10-CM | POA: Insufficient documentation

## 2021-07-29 DIAGNOSIS — Z4502 Encounter for adjustment and management of automatic implantable cardiac defibrillator: Secondary | ICD-10-CM | POA: Diagnosis not present

## 2021-07-29 DIAGNOSIS — I4891 Unspecified atrial fibrillation: Secondary | ICD-10-CM | POA: Diagnosis present

## 2021-07-29 LAB — CBC
HCT: 36.8 % — ABNORMAL LOW (ref 39.0–52.0)
Hemoglobin: 11.7 g/dL — ABNORMAL LOW (ref 13.0–17.0)
MCH: 30.3 pg (ref 26.0–34.0)
MCHC: 31.8 g/dL (ref 30.0–36.0)
MCV: 95.3 fL (ref 80.0–100.0)
Platelets: 204 10*3/uL (ref 150–400)
RBC: 3.86 MIL/uL — ABNORMAL LOW (ref 4.22–5.81)
RDW: 14.8 % (ref 11.5–15.5)
WBC: 10.1 10*3/uL (ref 4.0–10.5)
nRBC: 0 % (ref 0.0–0.2)

## 2021-07-29 LAB — TROPONIN I (HIGH SENSITIVITY): Troponin I (High Sensitivity): 19 ng/L — ABNORMAL HIGH (ref <18)

## 2021-07-29 LAB — BASIC METABOLIC PANEL
Anion gap: 10 (ref 5–15)
BUN: 43 mg/dL — ABNORMAL HIGH (ref 8–23)
CO2: 24 mmol/L (ref 22–32)
Calcium: 8.6 mg/dL — ABNORMAL LOW (ref 8.9–10.3)
Chloride: 107 mmol/L (ref 98–111)
Creatinine, Ser: 9.01 mg/dL — ABNORMAL HIGH (ref 0.61–1.24)
GFR, Estimated: 6 mL/min — ABNORMAL LOW (ref >60)
Glucose, Bld: 138 mg/dL — ABNORMAL HIGH (ref 70–99)
Potassium: 4.9 mmol/L (ref 3.5–5.1)
Sodium: 141 mmol/L (ref 135–145)

## 2021-07-29 LAB — PROTIME-INR
INR: 1.3 — ABNORMAL HIGH (ref 0.8–1.2)
Prothrombin Time: 16.3 seconds — ABNORMAL HIGH (ref 11.4–15.2)

## 2021-07-29 MED ORDER — PROPOFOL 10 MG/ML IV BOLUS
1.0000 mg/kg | Freq: Once | INTRAVENOUS | Status: AC
Start: 2021-07-29 — End: 2021-07-29
  Administered 2021-07-29: 69.5 mg via INTRAVENOUS
  Filled 2021-07-29: qty 20

## 2021-07-29 MED ORDER — DILTIAZEM HCL 25 MG/5ML IV SOLN
10.0000 mg | Freq: Once | INTRAVENOUS | Status: AC
  Administered 2021-07-29: 10 mg via INTRAVENOUS
  Filled 2021-07-29: qty 5

## 2021-07-29 NOTE — ED Provider Notes (Signed)
? ?[facility] ?Provider Note ? ? Event Date/Time  ? First MD Initiated Contact with Patient 07/29/21 1140   ?  (approximate) ?History  ?Atrial Fibrillation ? ?HPI ?[name] is a 76 y.o. male with a stated past medical history of end-stage renal disease on peritoneal dialysis, heart failure, and paroxysmal atrial fibrillation who presents for onset of weakness approximately 1 day prior to arrival.  Patient states that when he took his heart rate he found it to be in the 110s to 120s and remained stable at that rate.  Patient states that this feels different than his normal atrial fibrillation episodes as his rate usually goes up and down but this is stayed relatively similar.  Patient noted decreased exercise tolerance over this time as well.  Patient denies any chest pain, palpitations, syncope, nausea/vomiting/diarrhea/constipation ?Physical Exam  ?Triage Vital Signs: ?ED Triage Vitals  ?Enc Vitals Group  ?   BP 07/29/21 1125 (!) 133/91  ?   Pulse Rate 07/29/21 1125 (!) 115  ?   Resp 07/29/21 1125 20  ?   Temp 07/29/21 1125 97.8 ?F (36.6 ?C)  ?   Temp Source 07/29/21 1125 Oral  ?   SpO2 07/29/21 1125 98 %  ?   Weight 07/29/21 1126 153 lb 3.5 oz (69.5 kg)  ?   Height 07/29/21 1126 '5\' 6"'$  (1.676 m)  ?   Head Circumference --   ?   Peak Flow --   ?   Pain Score 07/29/21 1126 0  ?   Pain Loc --   ?   Pain Edu? --   ?   Excl. in [place]? --   ? ?Most recent vital signs: ?Vitals:  ? 07/29/21 1337 07/29/21 1454  ?BP: (!) 154/67 135/78  ?Pulse: (!) 55 65  ?Resp: 16 17  ?Temp:  98.5 ?F (36.9 ?C)  ?SpO2: 98% 98%  ? ?General: Awake, oriented x4. ?CV:  Good peripheral perfusion.  Tachycardic regular pulse ?Resp:  Normal effort.  ?Abd:  No distention.  ?Other:  Elderly Caucasian male laying in bed in no distress ?ED Results / Procedures / Treatments  ?Labs ?(all labs ordered are listed, but only abnormal results are displayed) ?Labs Reviewed  ?BASIC METABOLIC PANEL - Abnormal; Notable for the following  components:  ?    Result Value  ? Glucose, Bld 138 (*)   ? BUN 43 (*)   ? Creatinine, Ser 9.01 (*)   ? Calcium 8.6 (*)   ? GFR, Estimated 6 (*)   ? All other components within normal limits  ?CBC - Abnormal; Notable for the following components:  ? RBC 3.86 (*)   ? Hemoglobin 11.7 (*)   ? HCT 36.8 (*)   ? All other components within normal limits  ?PROTIME-INR - Abnormal; Notable for the following components:  ? Prothrombin Time 16.3 (*)   ? INR 1.3 (*)   ? All other components within normal limits  ?TROPONIN I (HIGH SENSITIVITY) - Abnormal; Notable for the following components:  ? Troponin I (High Sensitivity) 19 (*)   ? All other components within normal limits  ?TROPONIN I (HIGH SENSITIVITY)  ? ?EKG ?ED ECG REPORT ?I, Naaman Plummer, the attending physician, personally viewed and interpreted this ECG. ?Date: 07/29/2021 ?EKG Time: 1128 ?Rate: 116 ?Rhythm: Atrial flutter ?QRS Axis: normal ?Intervals: normal ?ST/T Wave abnormalities: normal ?Narrative Interpretation: Atrial flutter no evidence of acute ischemia ?PROCEDURES: ?Critical Care performed: Yes, see critical care procedure note(s) ?.Cardioversion ? ?  Date/Time: 07/29/2021 4:52 PM ?Performed by: Naaman Plummer, MD ?Authorized by: Naaman Plummer, MD  ? ?Consent:  ?  Consent obtained:  Verbal and written ?  Consent given by:  Patient ?  Risks discussed:  Cutaneous burn, death, pain and induced arrhythmia ?  Alternatives discussed:  Rate-control medication ?Pre-procedure details:  ?  Cardioversion basis:  Emergent ?  Rhythm:  Atrial flutter ?  Electrode placement:  Anterior-posterior ?Patient sedated: Yes. Refer to sedation procedure documentation for details of sedation. ? ?Attempt one:  ?  Cardioversion mode:  Synchronous ?  Waveform:  Monophasic ?  Shock (Joules):  120 ?  Shock outcome:  Conversion to normal sinus rhythm ?Post-procedure details:  ?  Patient status:  Awake ?  Patient tolerance of procedure:  Tolerated well, no immediate  complications ?.Sedation ? ?Date/Time: 07/29/2021 4:53 PM ?Performed by: Naaman Plummer, MD ?Authorized by: Naaman Plummer, MD  ? ?Consent:  ?  Consent obtained:  Verbal and written ?  Consent given by:  Patient ?  Risks discussed:  Allergic reaction, prolonged hypoxia resulting in organ damage, prolonged sedation necessitating reversal, dysrhythmia, inadequate sedation, respiratory compromise necessitating ventilatory assistance and intubation, vomiting and nausea ?  Alternatives discussed:  Analgesia without sedation ?Universal protocol:  ?  Immediately prior to procedure, a time out was called: yes   ?Indications:  ?  Procedure performed:  Cardioversion ?Pre-sedation assessment:  ?  Time since last food or drink:  4 hours ?  ASA classification: class 3 - patient with severe systemic disease   ?  Mouth opening:  3 or more finger widths ?  Thyromental distance:  4 finger widths ?  Mallampati score:  II - soft palate, uvula, fauces visible ?  Neck mobility: normal   ?  Pre-sedation assessments completed and reviewed: airway patency, cardiovascular function, hydration status, mental status, nausea/vomiting, pain level, respiratory function and temperature   ?Immediate pre-procedure details:  ?  Reassessment: Patient reassessed immediately prior to procedure   ?  Reviewed: vital signs, relevant labs/tests and NPO status   ?  Verified: bag valve mask available, emergency equipment available, intubation equipment available, IV patency confirmed, oxygen available, reversal medications available and suction available   ?Procedure details (see MAR for exact dosages):  ?  Preoxygenation:  Nonrebreather mask ?  Sedation:  Propofol ?  Intended level of sedation: moderate (conscious sedation) ?  Intra-procedure monitoring:  Blood pressure monitoring, continuous capnometry, frequent LOC assessments, frequent vital sign checks, continuous pulse oximetry and cardiac monitor ?  Intra-procedure events: hypoxia   ?  Intra-procedure  management:  Supplemental oxygen ?  Total Provider sedation time (minutes):  23 ?Post-procedure details:  ?  Attendance: Constant attendance by certified staff until patient recovered   ?  Recovery: Patient returned to pre-procedure baseline   ?  Post-sedation assessments completed and reviewed: airway patency, cardiovascular function, hydration status, mental status, nausea/vomiting, pain level, respiratory function and temperature   ?  Patient is stable for discharge or admission: yes   ?  Procedure completion:  Tolerated well, no immediate complications ?MEDICATIONS ORDERED IN ED: ?Medications  ?diltiazem (CARDIZEM) injection 10 mg (10 mg Intravenous Given 07/29/21 1218)  ?propofol (DIPRIVAN) 10 mg/mL bolus/IV push 69.5 mg (69.5 mg Intravenous Given 07/29/21 1330)  ? ?IMPRESSION / MDM / ASSESSMENT AND PLAN / ED COURSE  ?I reviewed the triage vital signs and the nursing notes. ?             ?               ?  The patient is on the cardiac monitor to evaluate for evidence of arrhythmia and/or significant heart rate changes. ?+ atrial flutter w/ RVR ?DDx: Pneumothorax, Pneumonia, Pulmonary Embolus, Tamponade, ACS, Thyrotoxicosis.  ?No history or evidence decompensated heart failure. ?Given their history and exam it is likely this patient is unlikely to spontaneously revert to a rate controlled rhythm and necessitates a thorough workup for their arrhythmia. ?Workup: ECG, CXR, CBC, BMP, UA, Troponin, BNP, TSH, Ca-Mag-Phos ?Interventions: 10 mg Cardizem IV, cardioversion ? ?Reassessment: Patient maintained NSR during multi-hour observation in ED. ?Disposition: Discharge home with prompt PCP follow up and cardiology referral. ? ? ? ?  ?FINAL CLINICAL IMPRESSION(S) / ED DIAGNOSES  ? ?Final diagnoses:  ?Atrial flutter by electrocardiogram [facility])  ?Encounter for cardioversion procedure  ? ?Rx / DC Orders  ? ?ED Discharge Orders   ? ? None  ? ?  ? ?Note:  This document was prepared using Dragon voice recognition software and may  include unintentional dictation errors. ?  ?Naaman Plummer, MD ?07/29/21 1655 ? ?

## 2021-07-29 NOTE — Discharge Instructions (Signed)
Someone from Dr. Mardene Speak office will call you within the next 24-48 hours for a follow-up appointment. ?Home cardiac medications: ?Amiodarone (Pacerone) 200 mg by mouth daily ?Amlodipine (Norvasc) 10 mg by mouth daily ?Bisoprolol (Zebeta) 5 mg by mouth daily ?

## 2021-07-29 NOTE — ED Triage Notes (Signed)
Pt comes into the ED via POV c/o a-fib rate being high at home.  PT states it has been ranging from 103-118 at home.  Pt denies any chest pain, dizziness, or SHOB, but does state he has weakness.  Pt ambulatory to triage at this time and in NAD.  ?

## 2021-07-30 ENCOUNTER — Ambulatory Visit (INDEPENDENT_AMBULATORY_CARE_PROVIDER_SITE_OTHER): Payer: Medicare PPO | Admitting: Internal Medicine

## 2021-07-30 ENCOUNTER — Encounter: Payer: Self-pay | Admitting: Internal Medicine

## 2021-07-30 VITALS — BP 192/100 | HR 54 | Ht 66.0 in | Wt 151.0 lb

## 2021-07-30 DIAGNOSIS — I429 Cardiomyopathy, unspecified: Secondary | ICD-10-CM

## 2021-07-30 DIAGNOSIS — N186 End stage renal disease: Secondary | ICD-10-CM | POA: Diagnosis not present

## 2021-07-30 DIAGNOSIS — I4892 Unspecified atrial flutter: Secondary | ICD-10-CM

## 2021-07-30 DIAGNOSIS — I4891 Unspecified atrial fibrillation: Secondary | ICD-10-CM | POA: Diagnosis not present

## 2021-07-30 DIAGNOSIS — I10 Essential (primary) hypertension: Secondary | ICD-10-CM

## 2021-07-30 MED ORDER — LOSARTAN POTASSIUM 100 MG PO TABS: 50.0000 mg | ORAL_TABLET | Freq: Every day | ORAL | Status: DC

## 2021-07-30 NOTE — Progress Notes (Signed)
Follow-up Outpatient Visit Date: 07/30/2021  Primary Care Provider: Crecencio Mc, MD [address] [place] [address]  Chief Complaint: ED follow-up for atrial flutter  HPI:  [name] is a 76 y.o. male with history of  Tracie Lindbloom-stage renal disease on peritoneal dialysis, cardiomyopathy with an EF of 40-45%, prior abnormal stress test, paroxysmal atrial fibrillation/flutter, hypertension, hyperlipidemia, peripheral arterial disease with chronic dissection of the left subclavian artery followed by vascular surgery, sleep apnea, renal cell carcinoma status post left heminephrectomy, tobacco abuse, stroke, and adenocarcinoma of the appendix, who presents for follow-up of cardiomyopathy and atrial fibrillation/flutter.  I last saw him in 04/2021, at which time he was feeling fairly well.  He was referred to Dr. Quentin Ore, who recommended rhythm control with continuation of amiodarone and apixaban.  He presented to the [facility] emergency department yesterday due to elevated heart rates and generalized malaise.  EKG showed atrial flutter with variable conduction.  He underwent cardioversion in the ED with restoration of sinus rhythm.  Today, [name] reports that he is feeling better after undergoing cardioversion yesterday.  He initially had developed palpitations and mildly elevated heart rate 2 days earlier and even took an extra dose of amiodarone without improvement.  That prompted him to come to the ED yesterday.  He is concerned about frequent itching since being started on amiodarone last fall.  He wonders if there is an alternative to this.  He denies chest pain and shortness of breath.  He had some lightheadedness yesterday in the setting of his atrial flutter and soft blood pressures.  He notes that he had stopped his losartan on his own since our last visit, as he was concerned that he was being overmedicated and that his blood pressure was dropping too low.  He typically feels  "draggy" when his SBP is less than 150 mmHg.  --------------------------------------------------------------------------------------------------  Past Medical History:  Diagnosis Date   Acute on chronic systolic congestive heart failure ([place])    Adenocarcinoma of appendix [facility]) Jan 2006   right kidney, s/p cryoablation   Atrial fibrillation with rapid ventricular response ([place]) 05/12/2020   Cardiomyopathy ([place])    a. 12/2018 Echo: EF 40-45%, global HK. Asc Ao 3.7cm; b. 01/2019 Lexi MV: small, mild, fixed basal and mid antlat defect - scar vs artifact. Small, mild mid and apical inf minimally reversible defect, likely scar w/ peri-infarct ischemia. Coronary and Ao atherosclerosis; c. 12/2019 Echo: EF 40-45%, glob HK, Gr1 DD. Nl RV fxn. Sev dil LA. Mild MR. Mild-mod Ao sclerosis w/o stenosis. Asc Ao 73m.   Carotid arterial disease ([place]    a. 01/2020 RICA 1-39%, RCCA/RECA < 593% LICA 17-16% LCCA/LECA <50%.   Claudication ([facility]    a. 02/2020 ABI/TBI: R 1.08/0.95, L 1.00/0.70.   Complication of anesthesia    had to be woken up slowly as his bp was elevated when did this quickly   Diabetes mellitus without complication (HCC)    ESRD (Yoneko Talerico stage renal disease) ([facility]    a. Peritoneal Dialysis pt.   Hyperlipidemia    Hypertension    Migraine    cluster   Neuromuscular disorder ([facility]    left lower extrem neuropathy   Obstructive sleep apnea    no OSA since had facial surgery with dr. jKathyrn Sheriffin 1997   PAD (peripheral artery disease) ([facility] Feb 2009   nonobstructing, renal angiogram ([name]   Renal cell carcinoma 2004   left kidney heminephrectomy   Renal insufficiency  Sepsis ([place]) 11/06/2020   Stroke ([place])    a. 12/2019 MRI: Small acute infarcts involving the left cerebral hemisphere and several chronic infarcts.   TIA (transient ischemic attack)    no residual but left leg and foot still feel heavy   tobacco abuse    Past Surgical History:  Procedure Laterality Date   CAPD INSERTION  N/A 03/01/2019   Procedure: LAPAROSCOPIC INSERTION CONTINUOUS AMBULATORY PERITONEAL DIALYSIS  (CAPD) CATHETER;  Surgeon: Algernon Huxley, MD;  Location: [facility] ORS;  Service: Vascular;  Laterality: N/A;   CARDIAC CATHETERIZATION     Dr. [name] did this to assess his renal artery   cyst removal  12/25/2015   Spine L4 and L5   heminephrectomy  2004   for renal cell CA   RENAL CRYOABLATION  Jan 2006   right kidney,  [name]   sciatica      Current Meds  Medication Sig   albuterol (VENTOLIN HFA) 108 (90 Base) MCG/ACT inhaler Inhale 2 puffs into the lungs every 6 (six) hours as needed for wheezing or shortness of breath.   amLODipine (NORVASC) 10 MG tablet TAKE 1 TABLET BY MOUTH ONCE DAILY   apixaban (ELIQUIS) 5 MG TABS tablet Take 1 tablet (5 mg total) by mouth 2 (two) times daily.   Ascorbic Acid 500 MG CHEW Chew 1 tablet by mouth daily.   b complex vitamins capsule Take 1 capsule by mouth daily.   bisoprolol (ZEBETA) 5 MG tablet Take 1 tablet (5 mg total) by mouth daily.   cloNIDine (CATAPRES - DOSED IN MG/24 HR) 0.1 mg/24hr patch Place 1 patch (0.1 mg total) onto the skin every Friday.   diphenhydrAMINE (BENADRYL) 25 MG tablet Take 25 mg by mouth every 6 (six) hours as needed for allergies.   doxazosin (CARDURA) 8 MG tablet Take 8 mg by mouth daily.   ferric citrate (AURYXIA) 1 GM 210 MG(Fe) tablet Take 1 tablet by mouth 3 (three) times daily.   fluticasone (FLONASE) 50 MCG/ACT nasal spray Place 2 sprays into both nostrils daily as needed for allergies or rhinitis.   gentamicin cream (GARAMYCIN) 0.1 % Apply 1 application topically daily.   multivitamin (RENA-VIT) TABS tablet Take 1 tablet by mouth at bedtime.   torsemide (DEMADEX) 100 MG tablet Take 100 mg by mouth daily.    Allergies: Irbesartan, Cymbalta [duloxetine hcl], Hydralazine, Imdur [isosorbide nitrate], Rosuvastatin, Atorvastatin, and Bystolic [nebivolol hcl]  Social History   Tobacco Use   Smoking status: Every Day     Packs/day: 1.00    Years: 50.00    Pack years: 50.00    Types: Cigarettes   Smokeless tobacco: Never  Vaping Use   Vaping Use: Former   Devices: tried but did not like  Substance Use Topics   Alcohol use: Not Currently   Drug use: No    Family History  Problem Relation Age of Onset   Hypertension Mother    Cancer Mother        breast   Aneurysm Mother    Coronary artery disease Father    Hypertension Father    Stroke Father 71   Heart disease Father    Heart attack Father 69   Aneurysm Maternal Grandmother        brain   Aneurysm Paternal Grandmother        brain   Coronary artery disease Paternal Grandfather    Heart disease Brother        valvular heart disease   COPD Brother  Hypertension Brother    Stroke Paternal Uncle     Review of Systems: A 12-system review of systems was performed and was negative except as noted in the HPI.  --------------------------------------------------------------------------------------------------  Physical Exam: BP (!) 192/100 (BP Location: Left Arm, Patient Position: Sitting, Cuff Size: Normal)    Pulse (!) 54    Ht '5\' 6"'$  (1.676 m)    Wt 151 lb (68.5 kg)    SpO2 100%    BMI 24.37 kg/m   General:  NAD. Neck: No JVD or HJR. Lungs: Mildly diminished breath sounds throughout without wheezes or crackles. Heart: Bradycardic but regular with 1/6 systolic murmur.  No rubs or gallops. Abdomen: Soft, nontender, nondistended.  Peritoneal dialysis catheter in place. Extremities: No lower extremity edema.  EKG: Sinus bradycardia with LVH, LAFB, poor R wave progression, and mild QT prolongation.  Compared with prior tracing from 04/23/2021, heart rate is slightly faster.  QT has also lengthened.  Lab Results  Component Value Date   WBC 10.1 07/29/2021   HGB 11.7 (L) 07/29/2021   HCT 36.8 (L) 07/29/2021   MCV 95.3 07/29/2021   PLT 204 07/29/2021    Lab Results  Component Value Date   NA 141 07/29/2021   K 4.9 07/29/2021   CL  107 07/29/2021   CO2 24 07/29/2021   BUN 43 (H) 07/29/2021   CREATININE 9.01 (H) 07/29/2021   GLUCOSE 138 (H) 07/29/2021   ALT 11 05/21/2021    Lab Results  Component Value Date   CHOL 122 05/12/2020   HDL 25 (L) 05/12/2020   LDLCALC 61 05/12/2020   LDLDIRECT 134.0 05/11/2017   TRIG 180 (H) 05/12/2020   CHOLHDL 4.9 05/12/2020    --------------------------------------------------------------------------------------------------  ASSESSMENT AND PLAN: Paroxysmal atrial fibrillation/flutter: Mr. He feels better following cardioversion yesterday.  Unfortunately, he had recurrent atrial fibrillation/flutter in the setting of amiodarone use.  He also is concerned about pruritus that began after initiation of amiodarone.  His antiarrhythmic options are limited due to his Kamorie Aldous-stage renal disease and cardiomyopathy.  I will reach out to Dr. Quentin Ore to inquire about other potential options for long-term management of his atrial fibrillation.  It will be difficult to escalate his AV nodal blockade at this point given resting heart rate in sinus rhythm in the upper 40s and low 50s.  For now, we will plan to continue amiodarone 200 mg daily, bisoprolol 5 mg daily, and apixaban 5 mg twice daily.  Cardiomyopathy: [name] appears euvolemic with stable NYHA class II symptoms.  I have asked him to restart losartan, albeit at 50 mg daily to prevent precipitous drop in blood pressure.  If he were to have symptomatic drops in blood pressure in the future, I would favor decreasing doxazosin or amlodipine rather than losartan, given its beneficial cardiac effects.  We will continue torsemide 100 mg daily.  Further volume management per PD with the assistance of nephrology.  Prior stress test did not show evidence of ischemia.  However, if [name] continues to feel unwell, we may ultimately need to consider definitive ischemia evaluation with cardiac catheterization.  His EKG in the setting of atrial flutter  yesterday showed marked ST segment changes, though some of this may be related to abnormal repolarization given lack of angina.  I would favor deferring invasive procedures until he has completed at least a month of uninterrupted anticoagulation following yesterday's cardioversion in the ED.  Hypertension: Blood pressure again uncontrolled today.  I have asked [name] to restart losartan  50 mg daily.  We will continue current doses of amlodipine and doxazosin as well as clonidine patch.  If he were to have drops in blood pressure, I would favor de-escalation of doxazosin or amlodipine before losartan.  Blood pressure remains difficult to control and cardiac catheterization is pursued, renal angiography at the time of catheterization may be beneficial.  Trameka Dorough-stage renal disease: [name] appears euvolemic.  Continue peritoneal dialysis as directed by nephrology.  Follow-up: Return to clinic in 4-6 weeks.  [name], MD 07/30/2021 10:05 AM

## 2021-07-30 NOTE — Patient Instructions (Signed)
Medication Instructions:  ? ?Your physician has recommended you make the following change in your medication:  ? ?RESTART Losartan 50 mg daily - Take HALF tablet of current dose (100 mg) for total of 50 mg  ? ?*If you need a refill on your cardiac medications before your next appointment, please call your pharmacy* ? ?The medications you are taking for heart rate and rhythm control are: Amiodarone and Bisoprolol ? ?Lab Work: ? ?None ordered ? ?Testing/Procedures: ? ?None ordered ? ? ?Follow-Up: ?At [facility], you and your health needs are our priority.  As part of our continuing mission to provide you with exceptional heart care, we have created designated Provider Care Teams.  These Care Teams include your primary Cardiologist (physician) and Advanced Practice Providers (APPs -  Physician Assistants and Nurse Practitioners) who all work together to provide you with the care you need, when you need it. ? ?We recommend signing up for the patient portal called "MyChart".  Sign up information is provided on this After Visit Summary.  MyChart is used to connect with patients for Virtual Visits (Telemedicine).  Patients are able to view lab/test results, encounter notes, upcoming appointments, etc.  Non-urgent messages can be sent to your provider as well.   ?To learn more about what you can do with MyChart, go to [email or web address].   ? ?Your next appointment:   ?4 - 6 week(s) ? ?The format for your next appointment:   ?In Person ? ?Provider:   ?You may see [name], MD or one of the following Advanced Practice Providers on your designated Care Team:   ?Barbian Hodgkins, NP ?Christell Faith, PA-C ?Cadence Kathlen Mody, PA-C ?

## 2021-08-13 ENCOUNTER — Other Ambulatory Visit: Payer: Self-pay | Admitting: Internal Medicine

## 2021-08-22 ENCOUNTER — Emergency Department: Payer: Medicare PPO

## 2021-08-22 ENCOUNTER — Other Ambulatory Visit: Payer: Self-pay

## 2021-08-22 ENCOUNTER — Emergency Department
Admission: EM | Admit: 2021-08-22 | Discharge: 2021-08-22 | Disposition: A | Discharge status: Home / Self Care | Payer: Medicare PPO | Attending: Emergency Medicine | Admitting: Emergency Medicine

## 2021-08-22 DIAGNOSIS — N186 End stage renal disease: Secondary | ICD-10-CM | POA: Insufficient documentation

## 2021-08-22 DIAGNOSIS — R531 Weakness: Secondary | ICD-10-CM | POA: Diagnosis not present

## 2021-08-22 DIAGNOSIS — Z992 Dependence on renal dialysis: Secondary | ICD-10-CM | POA: Insufficient documentation

## 2021-08-22 DIAGNOSIS — R42 Dizziness and giddiness: Secondary | ICD-10-CM | POA: Insufficient documentation

## 2021-08-22 LAB — BASIC METABOLIC PANEL
Anion gap: 10 (ref 5–15)
BUN: 60 mg/dL — ABNORMAL HIGH (ref 8–23)
CO2: 24 mmol/L (ref 22–32)
Calcium: 8.6 mg/dL — ABNORMAL LOW (ref 8.9–10.3)
Chloride: 105 mmol/L (ref 98–111)
Creatinine, Ser: 9.35 mg/dL — ABNORMAL HIGH (ref 0.61–1.24)
GFR, Estimated: 5 mL/min — ABNORMAL LOW (ref >60)
Glucose, Bld: 202 mg/dL — ABNORMAL HIGH (ref 70–99)
Potassium: 4.7 mmol/L (ref 3.5–5.1)
Sodium: 139 mmol/L (ref 135–145)

## 2021-08-22 LAB — CBC WITH DIFFERENTIAL/PLATELET
Abs Immature Granulocytes: 0.05 10*3/uL (ref 0.00–0.07)
Basophils Absolute: 0.1 10*3/uL (ref 0.0–0.1)
Basophils Relative: 1 %
Eosinophils Absolute: 0.3 10*3/uL (ref 0.0–0.5)
Eosinophils Relative: 4 %
HCT: 36.8 % — ABNORMAL LOW (ref 39.0–52.0)
Hemoglobin: 11.9 g/dL — ABNORMAL LOW (ref 13.0–17.0)
Immature Granulocytes: 1 %
Lymphocytes Relative: 13 %
Lymphs Abs: 1.1 10*3/uL (ref 0.7–4.0)
MCH: 29.5 pg (ref 26.0–34.0)
MCHC: 32.3 g/dL (ref 30.0–36.0)
MCV: 91.1 fL (ref 80.0–100.0)
Monocytes Absolute: 0.3 10*3/uL (ref 0.1–1.0)
Monocytes Relative: 4 %
Neutro Abs: 6.3 10*3/uL (ref 1.7–7.7)
Neutrophils Relative %: 77 %
Platelets: 170 10*3/uL (ref 150–400)
RBC: 4.04 MIL/uL — ABNORMAL LOW (ref 4.22–5.81)
RDW: 14.3 % (ref 11.5–15.5)
WBC: 8.2 10*3/uL (ref 4.0–10.5)
nRBC: 0 % (ref 0.0–0.2)

## 2021-08-22 LAB — TROPONIN I (HIGH SENSITIVITY): Troponin I (High Sensitivity): 14 ng/L (ref <18)

## 2021-08-22 MED ORDER — MECLIZINE HCL 25 MG PO TABS
25.0000 mg | ORAL_TABLET | Freq: Once | ORAL | Status: AC
  Administered 2021-08-22: 25 mg via ORAL
  Filled 2021-08-22: qty 1

## 2021-08-22 NOTE — ED Notes (Signed)
Pt given a warm blanket 

## 2021-08-22 NOTE — ED Triage Notes (Signed)
Pt was okay when he went to bed and woke up with dizziness, vision changes, AMS, and slow speech- pt woke up around 0915- pt states vision has gotten better, but he is still very dizzy- pt denies HA ?

## 2021-08-22 NOTE — ED Provider Notes (Signed)
? ?[facility] ?Provider Note ? ? ? Event Date/Time  ? First MD Initiated Contact with Patient 08/22/21 1559   ?  (approximate) ? ? ?History  ? ?Dizziness ? ? ?HPI ? ?[name] is a 76 y.o. male  who, per cardiology note dated 07/30/21 has history of ESRD on PD, cardiomyopathy, afib/flutter, PAD with chronic dissection of the left subclavian artery, CVA, who presents to the emergency department today because of concern for dizziness dizziness, weakness and vision change.  Patient states he noticed it when he woke up this morning.  He states everything looked black to him.  He was very dizzy and felt generalized weakness.  His vision has improved however he continues to feel weak and dizzy.  He denies feeling this way yesterday.  Does have a history of end-stage renal disease and states that the did perform a test on his peritoneal fluid between Thursday and Friday.  Patient denies any fevers. ? ?  ? ? ?Physical Exam  ? ?Triage Vital Signs: ?ED Triage Vitals  ?Enc Vitals Group  ?   BP 08/22/21 1538 (!) 172/61  ?   Pulse Rate 08/22/21 1538 (!) 53  ?   Resp 08/22/21 1538 18  ?   Temp --   ?   Temp src --   ?   SpO2 08/22/21 1538 95 %  ?   Weight 08/22/21 1544 145 lb (65.8 kg)  ?   Height 08/22/21 1544 '5\' 6"'$  (1.676 m)  ?   Head Circumference --   ?   Peak Flow --   ?   Pain Score 08/22/21 1541 0  ?   Pain Loc --   ?   Pain Edu? --   ?   Excl. in [place]? --   ? ? ?Most recent vital signs: ?Vitals:  ? 08/22/21 1538  ?BP: (!) 172/61  ?Pulse: (!) 53  ?Resp: 18  ?SpO2: 95%  ? ? ?General: Somnolent, awakens to verbal stimuli ?CV:  Good peripheral perfusion. Bradycardia. ?Resp:  Normal effort. Lungs clear. ?Abd:  No distention.  ? ? ?ED Results / Procedures / Treatments  ? ?Labs ?(all labs ordered are listed, but only abnormal results are displayed) ?Labs Reviewed  ?CBC WITH DIFFERENTIAL/PLATELET - Abnormal; Notable for the following components:  ?    Result Value  ? RBC 4.04 (*)   ? Hemoglobin 11.9 (*)    ? HCT 36.8 (*)   ? All other components within normal limits  ?BASIC METABOLIC PANEL - Abnormal; Notable for the following components:  ? Glucose, Bld 202 (*)   ? BUN 60 (*)   ? Creatinine, Ser 9.35 (*)   ? Calcium 8.6 (*)   ? GFR, Estimated 5 (*)   ? All other components within normal limits  ?URINALYSIS, COMPLETE (UACMP) WITH MICROSCOPIC  ?TROPONIN I (HIGH SENSITIVITY)  ?TROPONIN I (HIGH SENSITIVITY)  ? ? ? ?EKG ? ?INance Pear, attending physician, personally viewed and interpreted this EKG ? ?EKG Time: 1549 ?Rate: 51 ?Rhythm: sinus bradycardia ?Axis: left axis deviation ?Intervals: qtc 516 ?QRS: LAFB ?ST changes: no st elevation ?Impression: abnormal ekg ? ? ?RADIOLOGY ?Mr brain ?IMPRESSION:  ?No acute intracranial process. No etiology seen for the patient's  ?dizziness and vision changes.  ? ?I independently interpreted and visualized the CT head. My interpretation: No intracranial bleed. No large mass. ?Radiology interpretation:  ?IMPRESSION:  ?1. No acute intracranial abnormality.  ?2. Partial right mastoid effusion.  ?3. Mild chronic  microvascular ischemic change and cerebral volume  ?loss.  ? ? ? ?PROCEDURES: ? ?Critical Care performed: No ? ?Procedures ? ? ?MEDICATIONS ORDERED IN ED: ?Medications - No data to display ? ? ?IMPRESSION / MDM / ASSESSMENT AND PLAN / ED COURSE  ?I reviewed the triage vital signs and the nursing notes. ?             ?               ? ?Differential diagnosis includes, but is not limited to, vertigo, anemia, electrolyte abnormality, CVA. ? ?Patient presented to the emergency department today because of concerns for dizziness as well as vision change.  He stated that objects look black in both of his eyes.  The time my exam his vision was improved.  Blood work is without concerning anemia.  No electrolyte abnormalities of explain the patient's symptoms.  Did initially get a CT head which did not show any bleed or large mass.  Given concerns for possible CVA though an MRI was  obtained.  This also did not show any concerning findings.  At this time somewhat unclear etiology of the patient's symptoms.  He did stated he felt better and was able to ambulate without difficulty.  He did request discharge home which I think is reasonable at this time given negative work-up.  Did encourage close follow-up with outpatient physicians.  ? ? ?FINAL CLINICAL IMPRESSION(S) / ED DIAGNOSES  ? ?Final diagnoses:  ?Dizziness  ? ? ? ?Note:  This document was prepared using Dragon voice recognition software and may include unintentional dictation errors. ? ?  ?Nance Pear, MD ?08/22/21 2318 ? ?

## 2021-08-22 NOTE — ED Notes (Signed)
Lavender, light green, blue, red top tubes sent to lab. ?

## 2021-08-22 NOTE — ED Notes (Signed)
Pt given warm blanket.

## 2021-08-22 NOTE — Discharge Instructions (Signed)
Please seek medical attention for any high fevers, chest pain, shortness of breath, change in behavior, persistent vomiting, bloody stool or any other new or concerning symptoms.  

## 2021-09-09 ENCOUNTER — Ambulatory Visit (INDEPENDENT_AMBULATORY_CARE_PROVIDER_SITE_OTHER): Payer: Medicare PPO | Admitting: Cardiology

## 2021-09-09 ENCOUNTER — Encounter: Payer: Self-pay | Admitting: Cardiology

## 2021-09-09 VITALS — BP 140/70 | HR 56 | Ht 66.0 in | Wt 155.0 lb

## 2021-09-09 DIAGNOSIS — Z8673 Personal history of transient ischemic attack (TIA), and cerebral infarction without residual deficits: Secondary | ICD-10-CM

## 2021-09-09 DIAGNOSIS — I4819 Other persistent atrial fibrillation: Secondary | ICD-10-CM | POA: Diagnosis not present

## 2021-09-09 DIAGNOSIS — I4891 Unspecified atrial fibrillation: Secondary | ICD-10-CM

## 2021-09-09 DIAGNOSIS — N186 End stage renal disease: Secondary | ICD-10-CM | POA: Diagnosis not present

## 2021-09-09 DIAGNOSIS — I4892 Unspecified atrial flutter: Secondary | ICD-10-CM

## 2021-09-09 DIAGNOSIS — Z79899 Other long term (current) drug therapy: Secondary | ICD-10-CM

## 2021-09-09 NOTE — Patient Instructions (Addendum)
Medications: ?Your physician recommends that you continue on your current medications as directed. Please refer to the Current Medication list given to you today. ?*If you need a refill on your cardiac medications before your next appointment, please call your pharmacy* ? ?Lab Work: ?None. ?If you have labs (blood work) drawn today and your tests are completely normal, you will receive your results only by: ?MyChart Message (if you have MyChart) OR ?A paper copy in the mail ?If you have any lab test that is abnormal or we need to change your treatment, we will call you to review the results. ? ?Testing/Procedures: ?CMP, MAG, TSH, Free T4 ? ?Follow-Up: ?At [facility], you and your health needs are our priority.  As part of our continuing mission to provide you with exceptional heart care, we have created designated Provider Care Teams.  These Care Teams include your primary Cardiologist (physician) and Advanced Practice Providers (APPs -  Physician Assistants and Nurse Practitioners) who all work together to provide you with the care you need, when you need it. ? ?Your physician wants you to follow-up in: 6 months with one of the following Advanced Practice Providers on your designated Care Team:   ? ?Ignacia Bayley, NP ?Christell Faith PA ?Cadence Kathlen Mody PA ? ?  You will receive a reminder letter in the mail two months in advance. If you don't receive a letter, please call our office to schedule the follow-up appointment. ? ? ?We recommend signing up for the patient portal called "MyChart".  Sign up information is provided on this After Visit Summary.  MyChart is used to connect with patients for Virtual Visits (Telemedicine).  Patients are able to view lab/test results, encounter notes, upcoming appointments, etc.  Non-urgent messages can be sent to your provider as well.   ?To learn more about what you can do with MyChart, go to [email or web address].   ? ?Any Other Special Instructions Will Be Listed Below (If  Applicable). ?

## 2021-09-09 NOTE — Progress Notes (Signed)
?Electrophysiology Office Follow up Visit Note:   ? ?Date:  09/09/2021  ? ?ID:  [name], DOB March 07, 1946, MRN [id number] ? ?PCP:  Crecencio Mc, MD  ?[facility] Cardiologist:  [name], MD  ?[facility] Electrophysiologist:  Vickie Epley, MD  ? ? ?Interval History:   ? ?[name] is a 76 y.o. male who presents for a follow up visit. They were last seen in clinic 05/06/2021. His medical history includes atrial fibrillation, end-stage renal disease on peritoneal dialysis, chronic systolic heart failure, hypertension, hyperlipidemia, PAD, OSA, RCC status post nephrectomy, tobacco abuse, stroke and adenocarcinoma of the appendix. ? ?He is on Eliquis for his hx of AF. He was previously on amiodarone for rhythm control.  ? ?He saw Minta Balsam on 07/30/2021. That appt was in follow up from an ER visit for AFL w/ RVR on 07/29/2021. He was cardioverted while in the ER.  ? ?He presented again to the ER on 08/22/2021 with visual changes. A CT and MRI was performed which was unrevealing. His vision improved without specific intervention and he was discharged home. ? ?He presents today for follow up. ?  ? ?Past Medical History:  ?Diagnosis Date  ? Acute on chronic systolic congestive heart failure ([place])   ? Adenocarcinoma of appendix [facility]) Jan 2006  ? right kidney, s/p cryoablation  ? Atrial fibrillation with rapid ventricular response ([place]) 05/12/2020  ? Cardiomyopathy ([place])   ? a. 12/2018 Echo: EF 40-45%, global HK. Asc Ao 3.7cm; b. 01/2019 Lexi MV: small, mild, fixed basal and mid antlat defect - scar vs artifact. Small, mild mid and apical inf minimally reversible defect, likely scar w/ peri-infarct ischemia. Coronary and Ao atherosclerosis; c. 12/2019 Echo: EF 40-45%, glob HK, Gr1 DD. Nl RV fxn. Sev dil LA. Mild MR. Mild-mod Ao sclerosis w/o stenosis. Asc Ao 64m.  ? Carotid arterial disease ([place]   ? a. 01/2020 RICA 1-39%, RCCA/RECA < 522% LICA 19-79% LCCA/LECA <50%.  ? Claudication ([facility]   ? a. 02/2020  ABI/TBI: R 1.08/0.95, L 1.00/0.70.  ? Complication of anesthesia   ? had to be woken up slowly as his bp was elevated when did this quickly  ? Diabetes mellitus without complication ([place]   ? ESRD (end stage renal disease) ([place]   ? a. Peritoneal Dialysis pt.  ? Hyperlipidemia   ? Hypertension   ? Migraine   ? cluster  ? Neuromuscular disorder ([place]   ? left lower extrem neuropathy  ? Obstructive sleep apnea   ? no OSA since had facial surgery with dr. jKathyrn Sheriffin 1997  ? PAD (peripheral artery disease) ([facility] Feb 2009  ? nonobstructing, renal angiogram ([name]  ? Renal cell carcinoma 2004  ? left kidney heminephrectomy  ? Renal insufficiency   ? Sepsis ([place] 11/06/2020  ? Stroke ([facility]   ? a. 12/2019 MRI: Small acute infarcts involving the left cerebral hemisphere and several chronic infarcts.  ? TIA (transient ischemic attack)   ? no residual but left leg and foot still feel heavy  ? tobacco abuse   ? ? ?Past Surgical History:  ?Procedure Laterality Date  ? CAPD INSERTION N/A 03/01/2019  ? Procedure: LAPAROSCOPIC INSERTION CONTINUOUS AMBULATORY PERITONEAL DIALYSIS  (CAPD) CATHETER;  Surgeon: DAlgernon Huxley MD;  Location: [facility] ORS;  Service: Vascular;  Laterality: N/A;  ? CARDIAC CATHETERIZATION    ? Dr. AFletcher Anondid this to assess his renal artery  ? cyst removal  12/25/2015  ? Spine L4 and L5  ?  heminephrectomy  2004  ? for renal cell CA  ? RENAL CRYOABLATION  Jan 2006  ? right kidney,  [name]  ? sciatica    ? ? ?Current Medications: ?Current Meds  ?Medication Sig  ? albuterol (VENTOLIN HFA) 108 (90 Base) MCG/ACT inhaler Inhale 2 puffs into the lungs every 6 (six) hours as needed for wheezing or shortness of breath.  ? amiodarone (PACERONE) 200 MG tablet TAKE 1 TABLET BY MOUTH ONCE DAILY  ? amLODipine (NORVASC) 10 MG tablet TAKE 1 TABLET BY MOUTH ONCE DAILY  ? apixaban (ELIQUIS) 5 MG TABS tablet Take 1 tablet (5 mg total) by mouth 2 (two) times daily.  ? Ascorbic Acid 500 MG CHEW Chew 1 tablet by mouth daily.  ? b complex  vitamins capsule Take 1 capsule by mouth daily.  ? bisoprolol (ZEBETA) 5 MG tablet Take 1 tablet (5 mg total) by mouth daily.  ? cloNIDine (CATAPRES - DOSED IN MG/24 HR) 0.1 mg/24hr patch Place 1 patch (0.1 mg total) onto the skin every Friday.  ? diphenhydrAMINE (BENADRYL) 25 MG tablet Take 25 mg by mouth every 6 (six) hours as needed for allergies.  ? doxazosin (CARDURA) 8 MG tablet Take 8 mg by mouth daily.  ? ferric citrate (AURYXIA) 1 GM 210 MG(Fe) tablet Take 1 tablet by mouth 3 (three) times daily.  ? fluticasone (FLONASE) 50 MCG/ACT nasal spray Place 2 sprays into both nostrils daily as needed for allergies or rhinitis.  ? gentamicin cream (GARAMYCIN) 0.1 % Apply 1 application topically daily.  ? losartan (COZAAR) 100 MG tablet Take 0.5 tablets (50 mg total) by mouth daily.  ? multivitamin (RENA-VIT) TABS tablet Take 1 tablet by mouth at bedtime.  ? sevelamer (RENAGEL) 800 MG tablet Take 800 mg by mouth 3 (three) times daily with meals.  ? torsemide (DEMADEX) 100 MG tablet Take 100 mg by mouth daily.  ?  ? ?Allergies:   Irbesartan, Cymbalta [duloxetine hcl], Hydralazine, Imdur [isosorbide nitrate], Rosuvastatin, Atorvastatin, and Bystolic [nebivolol hcl]  ? ?Social History  ? ?Socioeconomic History  ? Marital status: Married  ?  Spouse name: Bailey Mech  ? Number of children: Not on file  ? Years of education: Not on file  ? Highest education level: Not on file  ?Occupational History  ? 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 Smoking status: Every Day  ?  Packs/day: 1.00  ?  Years: 50.00  ?  Pack years: 50.00  ?  Types: Cigarettes  ? Smokeless tobacco: Never  ?Vaping Use  ? Vaping Use: Former  ? Devices: tried but did not like  ?Substance and Sexual Activity  ? Alcohol use: Not Currently  ? Drug use: No  ? Sexual activity: Yes  ?Other Topics Concern  ? Not on file  ?Social History Narrative  ? Not on file  ? ?Social Determinants of Health  ? ?Financial Resource Strain: Low Risk   ?  Difficulty of Paying Living Expenses: Not hard at all  ?Food Insecurity: No Food Insecurity  ? Worried About Charity fundraiser in the Last Year: Never true  ? Ran Out of Food in the Last Year: Never true  ?Transportation Needs: No Transportation Needs  ? Lack of Transportation (Medical): No  ? Lack of Transportation (Non-Medical): No  ?Physical Activity: Not on file  ?Stress: No Stress Concern Present  ? Feeling of Stress : Not at all  ?Social Connections: Unknown  ? Frequency of Communication with Friends and Family:  Not on file  ? Frequency of Social Gatherings with Friends and Family: Not on file  ? Attends Religious Services: Not on file  ? Active Member of Clubs or Organizations: Not on file  ? Attends Archivist Meetings: Not on file  ? Marital Status: Married  ?  ? ?Family History: ?The patient's family history includes Aneurysm in his maternal grandmother, mother, and paternal grandmother; COPD in his brother; Cancer in his mother; Coronary artery disease in his father and paternal grandfather; Heart attack (age of onset: 66) in his father; Heart disease in his brother and father; Hypertension in his brother, father, and mother; Stroke in his paternal uncle; Stroke (age of onset: 30) in his father. ? ?ROS:   ?Please see the history of present illness.    ?All other systems reviewed and are negative. ? ?EKGs/Labs/Other Studies Reviewed:   ? ?The following studies were reviewed today: ? ? ? ?EKG:  The ekg ordered today demonstrates sinus bradycardia with a ventricular rate of 56 bpm.  Left anterior fascicular block. ? ?Recent Labs: ?04/02/2021: TSH 1.058 ?04/04/2021: Magnesium 2.4 ?05/21/2021: ALT 11 ?08/22/2021: BUN 60; Creatinine, Ser 9.35; Hemoglobin 11.9; Platelets 170; Potassium 4.7; Sodium 139  ?Recent Lipid Panel ?   ?Component Value Date/Time  ? CHOL 122 05/12/2020 1238  ? CHOL 159 01/04/2012 0428  ? TRIG 180 (H) 05/12/2020 1238  ? TRIG 218 (H) 01/04/2012 0428  ? HDL 25 (L) 05/12/2020 1238  ?  HDL 17 (L) 01/04/2012 0428  ? CHOLHDL 4.9 05/12/2020 1238  ? VLDL 36 05/12/2020 1238  ? VLDL 44 (H) 01/04/2012 0428  ? [place] 61 05/12/2020 1238  ? [place] 98 01/04/2012 0428  ? LDLDIRECT 134.0 05/11/2017

## 2021-09-10 ENCOUNTER — Ambulatory Visit: Payer: Medicare PPO | Admitting: Internal Medicine

## 2021-09-10 LAB — TSH: TSH: 6.94 u[IU]/mL — ABNORMAL HIGH (ref 0.450–4.500)

## 2021-09-10 LAB — COMPREHENSIVE METABOLIC PANEL
ALT: 9 IU/L (ref 0–44)
AST: 7 IU/L (ref 0–40)
Albumin/Globulin Ratio: 1.3 (ref 1.2–2.2)
Albumin: 2.9 g/dL — ABNORMAL LOW (ref 3.7–4.7)
Alkaline Phosphatase: 128 IU/L — ABNORMAL HIGH (ref 44–121)
BUN/Creatinine Ratio: 6 — ABNORMAL LOW (ref 10–24)
BUN: 51 mg/dL — ABNORMAL HIGH (ref 8–27)
Bilirubin Total: <0.2 mg/dL (ref 0.0–1.2)
CO2: 18 mmol/L — ABNORMAL LOW (ref 20–29)
Calcium: 8 mg/dL — ABNORMAL LOW (ref 8.6–10.2)
Chloride: 106 mmol/L (ref 96–106)
Creatinine, Ser: 8.02 mg/dL — ABNORMAL HIGH (ref 0.76–1.27)
Globulin, Total: 2.3 g/dL (ref 1.5–4.5)
Glucose: 128 mg/dL — ABNORMAL HIGH (ref 70–99)
Potassium: 5 mmol/L (ref 3.5–5.2)
Sodium: 142 mmol/L (ref 134–144)
Total Protein: 5.2 g/dL — ABNORMAL LOW (ref 6.0–8.5)
eGFR: 6 mL/min/{1.73_m2} — ABNORMAL LOW (ref >59)

## 2021-09-10 LAB — MAGNESIUM: Magnesium: 2.2 mg/dL (ref 1.6–2.3)

## 2021-09-10 LAB — T4, FREE: Free T4: 1.09 ng/dL (ref 0.82–1.77)

## 2021-09-13 IMAGING — CR DG CHEST 2V
1 series · 2 of 2 positions shown · non-contrast
Comparison: 08/06/2020

CLINICAL DATA: Shortness of breath for 1 week

EXAM:
CHEST - 2 VIEW

[Series 1: dg chest 2 view · 0.14mm/px · 2 of 2 slices shown]
[im 1/2]
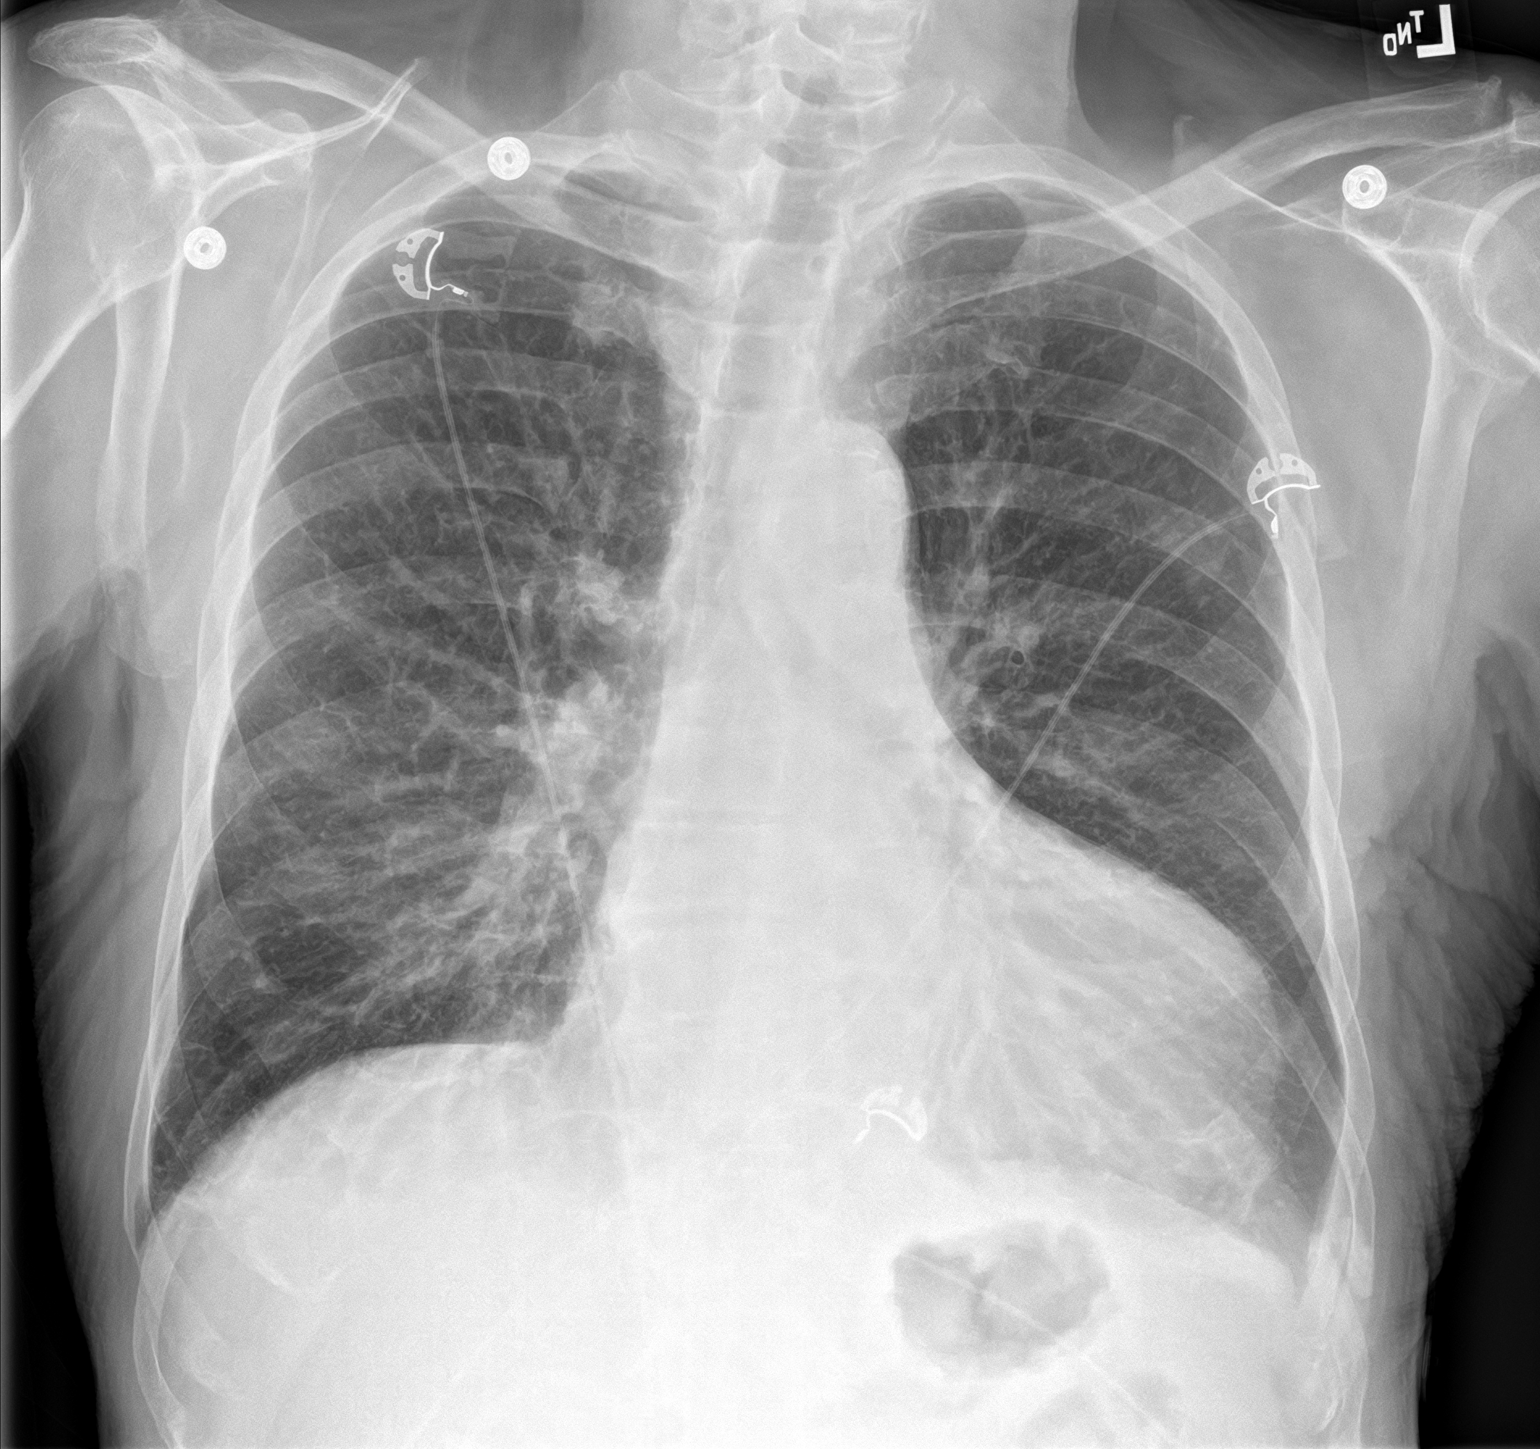
[im 2/2]
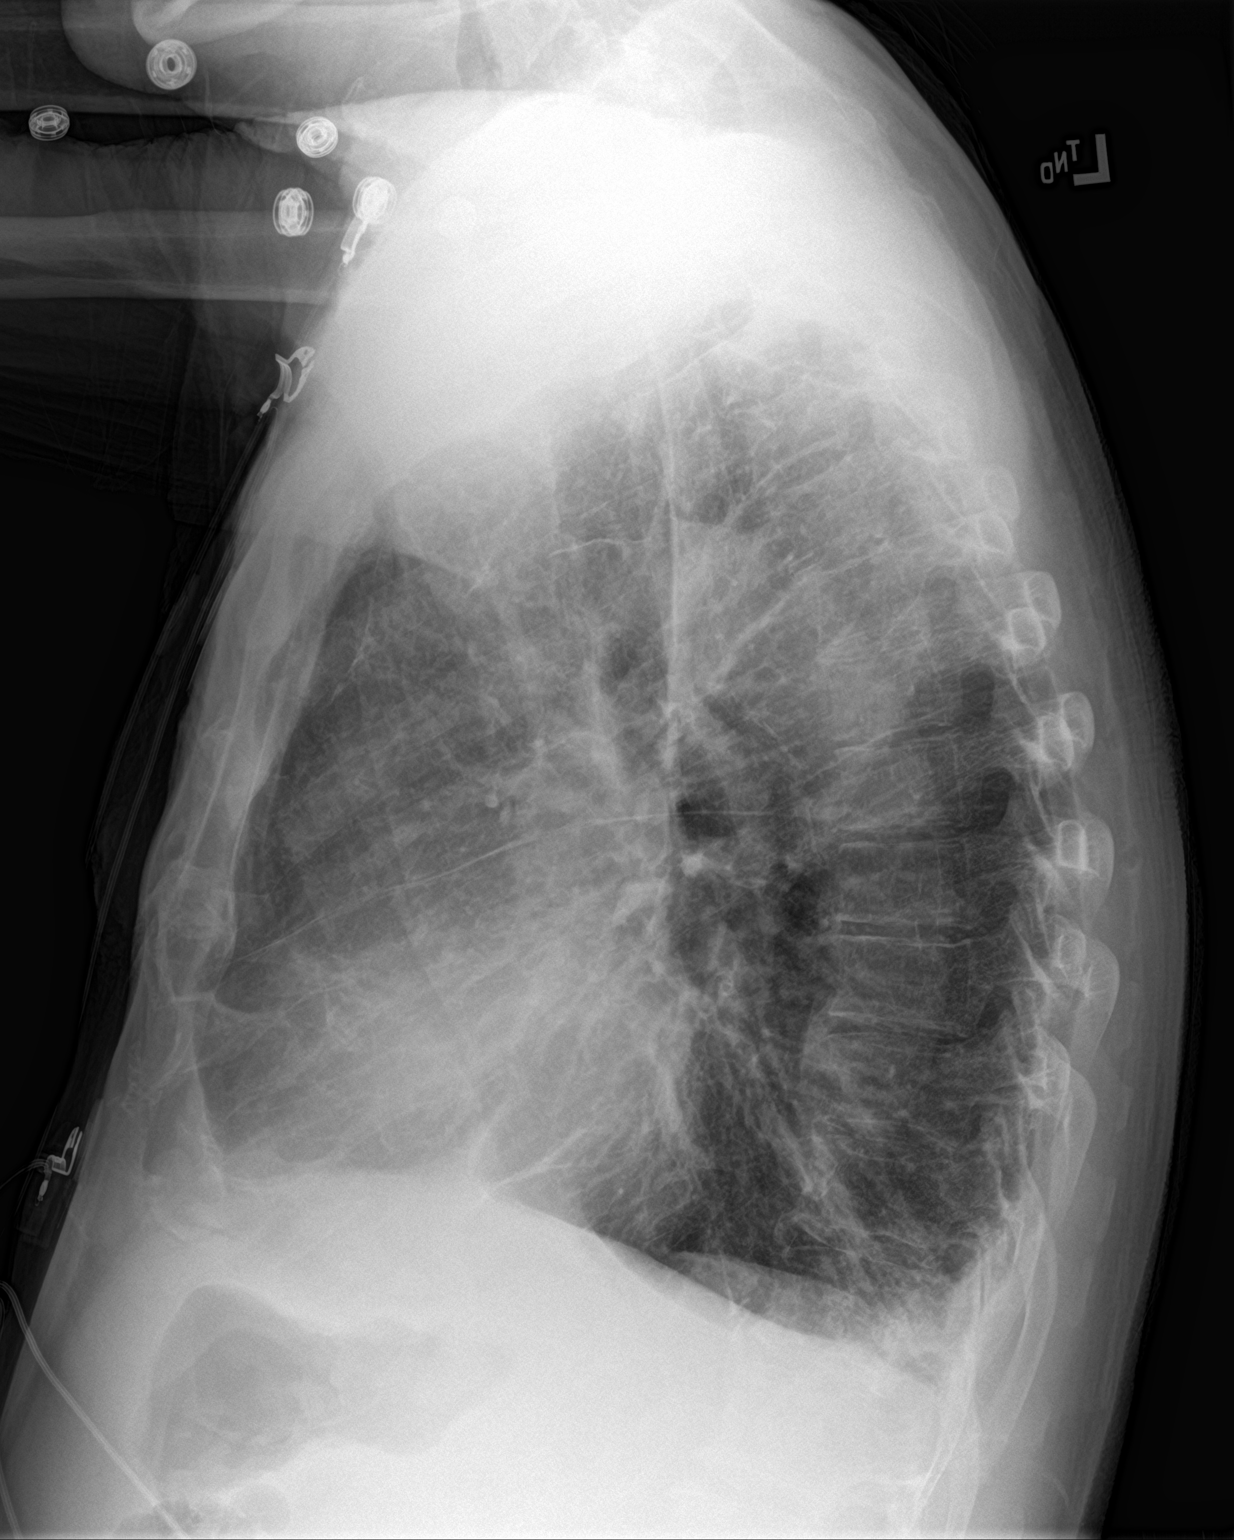

[2 of 2 positions shown; findings below may reference images not displayed]

FINDINGS: Stable mild cardiomegaly. Atherosclerotic calcification of the
aortic knob. Chronic coarsened interstitial markings. No new focal
airspace consolidation. No pleural effusion or pneumothorax.
IMPRESSION: No active cardiopulmonary disease.

## 2021-10-01 ENCOUNTER — Ambulatory Visit (INDEPENDENT_AMBULATORY_CARE_PROVIDER_SITE_OTHER): Payer: Medicare PPO

## 2021-10-01 ENCOUNTER — Ambulatory Visit: Payer: Medicare PPO

## 2021-10-01 ENCOUNTER — Ambulatory Visit
Admission: EM | Admit: 2021-10-01 | Discharge: 2021-10-01 | Disposition: A | Discharge status: Home / Self Care | Payer: Medicare PPO | Attending: Emergency Medicine | Admitting: Emergency Medicine

## 2021-10-01 ENCOUNTER — Encounter: Payer: Self-pay | Admitting: Emergency Medicine

## 2021-10-01 DIAGNOSIS — S81801A Unspecified open wound, right lower leg, initial encounter: Secondary | ICD-10-CM

## 2021-10-01 DIAGNOSIS — M25571 Pain in right ankle and joints of right foot: Secondary | ICD-10-CM | POA: Insufficient documentation

## 2021-10-01 DIAGNOSIS — Z23 Encounter for immunization: Secondary | ICD-10-CM | POA: Diagnosis not present

## 2021-10-01 DIAGNOSIS — M7989 Other specified soft tissue disorders: Secondary | ICD-10-CM | POA: Diagnosis present

## 2021-10-01 DIAGNOSIS — S80811A Abrasion, right lower leg, initial encounter: Secondary | ICD-10-CM | POA: Diagnosis not present

## 2021-10-01 LAB — BASIC METABOLIC PANEL
Anion gap: 10 (ref 5–15)
BUN: 62 mg/dL — ABNORMAL HIGH (ref 8–23)
CO2: 23 mmol/L (ref 22–32)
Calcium: 8.7 mg/dL — ABNORMAL LOW (ref 8.9–10.3)
Chloride: 103 mmol/L (ref 98–111)
Creatinine, Ser: 8.99 mg/dL — ABNORMAL HIGH (ref 0.61–1.24)
GFR, Estimated: 6 mL/min — ABNORMAL LOW (ref >60)
Glucose, Bld: 98 mg/dL (ref 70–99)
Potassium: 5.3 mmol/L — ABNORMAL HIGH (ref 3.5–5.1)
Sodium: 136 mmol/L (ref 135–145)

## 2021-10-01 LAB — CBC WITH DIFFERENTIAL/PLATELET
Abs Immature Granulocytes: 0.04 10*3/uL (ref 0.00–0.07)
Basophils Absolute: 0.1 10*3/uL (ref 0.0–0.1)
Basophils Relative: 1 %
Eosinophils Absolute: 1.1 10*3/uL — ABNORMAL HIGH (ref 0.0–0.5)
Eosinophils Relative: 10 %
HCT: 32.7 % — ABNORMAL LOW (ref 39.0–52.0)
Hemoglobin: 10.9 g/dL — ABNORMAL LOW (ref 13.0–17.0)
Immature Granulocytes: 0 %
Lymphocytes Relative: 15 %
Lymphs Abs: 1.6 10*3/uL (ref 0.7–4.0)
MCH: 30.1 pg (ref 26.0–34.0)
MCHC: 33.3 g/dL (ref 30.0–36.0)
MCV: 90.3 fL (ref 80.0–100.0)
Monocytes Absolute: 0.9 10*3/uL (ref 0.1–1.0)
Monocytes Relative: 8 %
Neutro Abs: 7.4 10*3/uL (ref 1.7–7.7)
Neutrophils Relative %: 66 %
Platelets: 181 10*3/uL (ref 150–400)
RBC: 3.62 MIL/uL — ABNORMAL LOW (ref 4.22–5.81)
RDW: 15.3 % (ref 11.5–15.5)
WBC: 11.2 10*3/uL — ABNORMAL HIGH (ref 4.0–10.5)
nRBC: 0 % (ref 0.0–0.2)

## 2021-10-01 MED ORDER — TETANUS-DIPHTH-ACELL PERTUSSIS 5-2.5-18.5 LF-MCG/0.5 IM SUSY
0.5000 mL | PREFILLED_SYRINGE | Freq: Once | INTRAMUSCULAR | Status: AC
  Administered 2021-10-01: 0.5 mL via INTRAMUSCULAR

## 2021-10-01 MED ORDER — DOXYCYCLINE HYCLATE 100 MG PO CAPS: 100.0000 mg | ORAL_CAPSULE | Freq: Two times a day (BID) | ORAL | 0 refills | Status: AC

## 2021-10-01 NOTE — Discharge Instructions (Addendum)
I have updated your tetanus today.  It is good for 10 years.  Finish the doxycycline, unless a healthcare provider tells you to stop.  This is for possible infection.  May take 1000 mg of Tylenol 3 times a day as needed for pain.  Continue warm Epsom salt soaks, Neosporin and dressing it.  I have put in an urgent referral into Dr. Harlow Mares at [facility].  Also give him a call and schedule an appointment.  You can also go to their walk-in clinic.  They are open till 9:00 today and tomorrow.  If you are unable to be seen by orthopedics, no follow-up with Dr. Zigmund Daniel.  Go immediately to the emergency department for fevers above 100.4, pain not controlled with Tylenol, or for any other questions. ?

## 2021-10-01 NOTE — ED Provider Notes (Signed)
HPI ? ?SUBJECTIVE: ? ?[name] is a 76 y.o. male who presents with right distal lower extremity pain, swelling, bruising after hitting his right shin on a metal trailer 1 week ago.  He reports distal extremity swelling, bruising around the ankle and foot starting about 3 days ago.  He states the wound is sore.  The erythema surrounding the wound has not changed in size.  No purulent drainage.  No new numbness or tingling distally.  No fevers, body aches, nausea, vomiting, flulike symptoms, other trauma to the leg.  He has been ambulatory on it since the injury.  He soaked his leg in warm Epsom salt bath, and has been applying Neosporin, dressing it.  The Epsom salt soaks help.  Symptoms are worse with weightbearing, moving his ankle/leg a certain way.  He denies knee or ankle trauma.  No posterior knee pain.  No fevers, bodies, nausea, vomiting, flulike symptoms.  He ? ?Patient has a complex medical history including atrial fibrillation/flutter, borderline diabetes, ERSD on peritoneal dialysis, hyperlipidemia, hypertension, renal cell carcinoma status post left heminephrectomy, prostate cancer, sepsis, stroke, CHF, PAD with chronic dissection of the left subclavian artery.  Patient is on Eliquis states that he is compliant with it.  He also states that he is compliant with his blood pressure medication.  No history of DVT, PE.  Last tetanus unknown. ? ?Past Medical History:  ?Diagnosis Date  ? Acute on chronic systolic congestive heart failure ([place])   ? Adenocarcinoma of appendix [facility]) Jan 2006  ? right kidney, s/p cryoablation  ? Atrial fibrillation with rapid ventricular response ([place]) 05/12/2020  ? Cardiomyopathy ([place])   ? a. 12/2018 Echo: EF 40-45%, global HK. Asc Ao 3.7cm; b. 01/2019 Lexi MV: small, mild, fixed basal and mid antlat defect - scar vs artifact. Small, mild mid and apical inf minimally reversible defect, likely scar w/ peri-infarct ischemia. Coronary and Ao atherosclerosis; c. 12/2019 Echo: EF  40-45%, glob HK, Gr1 DD. Nl RV fxn. Sev dil LA. Mild MR. Mild-mod Ao sclerosis w/o stenosis. Asc Ao 38m.  ? Carotid arterial disease ([place]   ? a. 01/2020 RICA 1-39%, RCCA/RECA < 505% LICA 13-97% LCCA/LECA <50%.  ? Claudication ([facility]   ? a. 02/2020 ABI/TBI: R 1.08/0.95, L 1.00/0.70.  ? Complication of anesthesia   ? had to be woken up slowly as his bp was elevated when did this quickly  ? Diabetes mellitus without complication ([place]   ? ESRD (end stage renal disease) ([place]   ? a. Peritoneal Dialysis pt.  ? Hyperlipidemia   ? Hypertension   ? Migraine   ? cluster  ? Neuromuscular disorder ([place]   ? left lower extrem neuropathy  ? Obstructive sleep apnea   ? no OSA since had facial surgery with dr. jKathyrn Sheriffin 1997  ? PAD (peripheral artery disease) ([facility] Feb 2009  ? nonobstructing, renal angiogram ([name]  ? Renal cell carcinoma 2004  ? left kidney heminephrectomy  ? Renal insufficiency   ? Sepsis ([place] 11/06/2020  ? Stroke ([facility]   ? a. 12/2019 MRI: Small acute infarcts involving the left cerebral hemisphere and several chronic infarcts.  ? TIA (transient ischemic attack)   ? no residual but left leg and foot still feel heavy  ? tobacco abuse   ? ? ?Past Surgical History:  ?Procedure Laterality Date  ? CAPD INSERTION N/A 03/01/2019  ? Procedure: LAPAROSCOPIC INSERTION CONTINUOUS AMBULATORY PERITONEAL DIALYSIS  (CAPD) CATHETER;  Surgeon: DAlgernon Huxley MD;  Location: [facility] ORS;  Service: Vascular;  Laterality: N/A;  ? CARDIAC CATHETERIZATION    ? Dr. [name] did this to assess his renal artery  ? cyst removal  12/25/2015  ? Spine L4 and L5  ? heminephrectomy  2004  ? for renal cell CA  ? RENAL CRYOABLATION  Jan 2006  ? right kidney,  [name]  ? sciatica    ? ? ?Family History  ?Problem Relation Age of Onset  ? Hypertension Mother   ? Cancer Mother   ?     breast  ? Aneurysm Mother   ? Coronary artery disease Father   ? Hypertension Father   ? Stroke Father 33  ? Heart disease Father   ? Heart attack Father 19  ? Aneurysm  Maternal Grandmother   ?     brain  ? Aneurysm Paternal Grandmother   ?     brain  ? Coronary artery disease Paternal Grandfather   ? Heart disease Brother   ?     valvular heart disease  ? COPD Brother   ? Hypertension Brother   ? Stroke Paternal Uncle   ? ? ?Social History  ? ?Tobacco Use  ? Smoking status: Every Day  ?  Packs/day: 1.00  ?  Years: 50.00  ?  Pack years: 50.00  ?  Types: Cigarettes  ? Smokeless tobacco: Never  ?Vaping Use  ? Vaping Use: Former  ? Devices: tried but did not like  ?Substance Use Topics  ? Alcohol use: Not Currently  ? Drug use: No  ? ? ?No current facility-administered medications for this encounter. ? ?Current Outpatient Medications:  ?  doxycycline (VIBRAMYCIN) 100 MG capsule, Take 1 capsule (100 mg total) by mouth 2 (two) times daily for 10 days., Disp: 20 capsule, Rfl: 0 ?  albuterol (VENTOLIN HFA) 108 (90 Base) MCG/ACT inhaler, Inhale 2 puffs into the lungs every 6 (six) hours as needed for wheezing or shortness of breath., Disp: 8 g, Rfl: 11 ?  amiodarone (PACERONE) 200 MG tablet, TAKE 1 TABLET BY MOUTH ONCE DAILY, Disp: 90 tablet, Rfl: 0 ?  amLODipine (NORVASC) 10 MG tablet, TAKE 1 TABLET BY MOUTH ONCE DAILY, Disp: 90 tablet, Rfl: 0 ?  apixaban (ELIQUIS) 5 MG TABS tablet, Take 1 tablet (5 mg total) by mouth 2 (two) times daily., Disp: 60 tablet, Rfl: 11 ?  Ascorbic Acid 500 MG CHEW, Chew 1 tablet by mouth daily., Disp: , Rfl:  ?  b complex vitamins capsule, Take 1 capsule by mouth daily., Disp: , Rfl:  ?  bisoprolol (ZEBETA) 5 MG tablet, Take 1 tablet (5 mg total) by mouth daily., Disp: 30 tablet, Rfl: 0 ?  cloNIDine (CATAPRES - DOSED IN MG/24 HR) 0.1 mg/24hr patch, Place 1 patch (0.1 mg total) onto the skin every Friday., Disp: 12 patch, Rfl: 3 ?  diphenhydrAMINE (BENADRYL) 25 MG tablet, Take 25 mg by mouth every 6 (six) hours as needed for allergies., Disp: , Rfl:  ?  doxazosin (CARDURA) 8 MG tablet, Take 8 mg by mouth daily., Disp: , Rfl:  ?  ferric citrate (AURYXIA) 1 GM  210 MG(Fe) tablet, Take 1 tablet by mouth 3 (three) times daily., Disp: , Rfl:  ?  fluticasone (FLONASE) 50 MCG/ACT nasal spray, Place 2 sprays into both nostrils daily as needed for allergies or rhinitis., Disp: , Rfl:  ?  gentamicin cream (GARAMYCIN) 0.1 %, Apply 1 application topically daily., Disp: 15 g, Rfl: 0 ?  losartan (COZAAR) 100 MG tablet, Take 0.5 tablets (  50 mg total) by mouth daily., Disp: , Rfl:  ?  multivitamin (RENA-VIT) TABS tablet, Take 1 tablet by mouth at bedtime., Disp: , Rfl:  ?  sevelamer (RENAGEL) 800 MG tablet, Take 800 mg by mouth 3 (three) times daily with meals., Disp: , Rfl:  ?  torsemide (DEMADEX) 100 MG tablet, Take 100 mg by mouth daily., Disp: , Rfl:  ? ?Allergies  ?Allergen Reactions  ? Irbesartan Other (See Comments)  ?  hyperkalemia  ? Cymbalta [Duloxetine Hcl]   ? Hydralazine Other (See Comments)  ?  headache  ? Imdur [Isosorbide Nitrate] Other (See Comments)  ?  headache  ? Rosuvastatin   ?  Breast swelling/soreness  ? Atorvastatin Other (See Comments)  ?  Muscle pain  ? Bystolic [Nebivolol Hcl]   ?  Extreme fatigue   ? ? ? ?ROS ? ?As noted in HPI.  ? ?Physical Exam ? ?BP (!) 181/73 (BP Location: Left Arm)   Pulse (!) 57   Temp 97.8 ?F (36.6 ?C) (Oral)   Resp 19   Ht '5\' 6"'$  (1.676 m)   Wt 70.3 kg   SpO2 98%   BMI 25.01 kg/m?  ? ?BP Readings from Last 3 Encounters:  ?10/01/21 (!) 181/73  ?09/09/21 140/70  ?08/22/21 (!) 186/78  ? ? ? ?Constitutional: Well developed, well nourished, no acute distress ?Eyes:  EOMI, conjunctiva normal bilaterally ?HENT: Normocephalic, atraumatic,mucus membranes moist ?Respiratory: Normal inspiratory effort ?Cardiovascular: Normal rate ?GI: nondistended ?skin: Large black eschar with surrounding tender erythema right lower extremity.  No subcutaneous crepitus.  No expressible purulent drainage ?Musculoskeletal: Right calf 37 cm, left calf 34.5 centimeters.  1+ edema right leg, trace edema left leg.  Bruising around the ankle and along the foot.   Tenderness at the medial malleolus.  No other ankle bony or ligamentous tenderness.  Pain along anterior tibialis with dorsiflexion.  No pain with plantarflexion, inversion/eversion.  DP 2+.  No foot tenderness.

## 2021-10-01 NOTE — ED Triage Notes (Signed)
Pt reports right leg swelling and pain that has gotten progressively worse the last couple days. States he fell last week and has small abrasion with redness around it. Pt also has bruising around the top of foot and toes.  ?

## 2021-10-04 ENCOUNTER — Other Ambulatory Visit: Payer: Self-pay | Admitting: Internal Medicine

## 2021-10-16 ENCOUNTER — Encounter: Payer: Self-pay | Admitting: Internal Medicine

## 2021-10-16 ENCOUNTER — Ambulatory Visit (INDEPENDENT_AMBULATORY_CARE_PROVIDER_SITE_OTHER): Payer: Medicare PPO | Admitting: Internal Medicine

## 2021-10-16 VITALS — BP 190/70 | HR 55 | Ht 66.0 in | Wt 149.0 lb

## 2021-10-16 DIAGNOSIS — I10 Essential (primary) hypertension: Secondary | ICD-10-CM | POA: Diagnosis not present

## 2021-10-16 DIAGNOSIS — I2510 Atherosclerotic heart disease of native coronary artery without angina pectoris: Secondary | ICD-10-CM

## 2021-10-16 DIAGNOSIS — N186 End stage renal disease: Secondary | ICD-10-CM

## 2021-10-16 DIAGNOSIS — I4892 Unspecified atrial flutter: Secondary | ICD-10-CM | POA: Diagnosis not present

## 2021-10-16 DIAGNOSIS — I429 Cardiomyopathy, unspecified: Secondary | ICD-10-CM

## 2021-10-16 DIAGNOSIS — I4891 Unspecified atrial fibrillation: Secondary | ICD-10-CM | POA: Diagnosis not present

## 2021-10-16 MED ORDER — LOSARTAN POTASSIUM 100 MG PO TABS: 100.0000 mg | ORAL_TABLET | Freq: Every day | ORAL | 1 refills | Status: DC

## 2021-10-16 NOTE — Patient Instructions (Addendum)
Medication Instructions:  Your physician has recommended you make the following change in your medication:   INCREASE Losartan 100 mg daily.  HOLD Amiodarone for 1 week. Let us know after 1 week if itching has improved.   *If you need a refill on your cardiac medications before your next appointment, please call your pharmacy*   Lab Work: None ordered  If you have labs (blood work) drawn today and your tests are completely normal, you will receive your results only by: [place] (if you have MyChart) OR A paper copy in the mail If you have any lab test that is abnormal or we need to change your treatment, we will call you to review the results.   Testing/Procedures: None ordered   Follow-Up: At [facility], you and your health needs are our priority.  As part of our continuing mission to provide you with exceptional heart care, we have created designated Provider Care Teams.  These Care Teams include your primary Cardiologist (physician) and Advanced Practice Providers (APPs -  Physician Assistants and Nurse Practitioners) who all work together to provide you with the care you need, when you need it.  We recommend signing up for the patient portal called "MyChart".  Sign up information is provided on this After Visit Summary.  MyChart is used to connect with patients for Virtual Visits (Telemedicine).  Patients are able to view lab/test results, encounter notes, upcoming appointments, etc.  Non-urgent messages can be sent to your provider as well.   To learn more about what you can do with MyChart, go to [email or web address].    Your next appointment:   4 week(s)  The format for your next appointment:   In Person  Provider:   You may see [name], MD or one of the following Advanced Practice Providers on your designated Care Team:   Colson Hodgkins, NP Christell Faith, PA-C Cadence Kathlen Mody, PA-C{    Other Instructions N/A  Important Information About  [place]

## 2021-10-16 NOTE — Progress Notes (Unsigned)
Follow-up Outpatient Visit Date: 10/16/2021  Primary Care Provider: Crecencio Mc, MD [address] [place] [address]  Chief Complaint: Itching  HPI:  [name] is a 76 y.o. male with history of [name]-stage renal disease on peritoneal dialysis, cardiomyopathy with an EF of 40-45%, prior abnormal stress test, paroxysmal atrial fibrillation/flutter, hypertension, hyperlipidemia, peripheral arterial disease with chronic dissection of the left subclavian artery followed by vascular surgery, sleep apnea, renal cell carcinoma status post left heminephrectomy, tobacco abuse, stroke, and adenocarcinoma of the appendix, who presents for follow-up of cardiomyopathy and atrial fibrillation.  I last saw him in early March, at which time he was feeling better, having undergone cardioversion a day earlier in the ED due to generalized malaise and elevated heart rates secondary to atrial flutter.  He was concerned about pruritus that he attributed to addition of amiodarone.  He also noted that he had stopped losartan after our previous visit because he felt like he was overmedicated.  His blood pressure was quite elevated at our visit; I advised him to restart losartan 50 mg daily.  We discussed potentially decreasing doxazosin or amlodipine in the future as a first step were his lightheadedness and fatigue to recur.  He was subsequently seen by Dr. Quentin Torres, who recommended continuation of amiodarone and apixaban.  Today, [name] is most bothered by persistent itching.  This began about 6 months ago.  He remains concerned that it could be related to one of his medications, specifically amiodarone or apixaban.  It was suggested at his recent visit with Dr. Quentin Torres that his ESRD/PD could be driving his pruritis, though [name] has been on dialysis for >1 year and did not have any itching until the last 6 months.  He has stable exertional dyspnea/fatigue that has been unchanged for several  months.  He denies chest pain, palpitations, lightheadedness, and edema.  Home BP's are typically 150-190/60-70.  He is tolerating losartan, which we added back at our last visit.  He denies bleeding.  --------------------------------------------------------------------------------------------------  Past Medical History:  Diagnosis Date   Acute on chronic systolic congestive heart failure [name])    Adenocarcinoma of appendix [name]) Jan 2006   right kidney, s/p cryoablation   Atrial fibrillation with rapid ventricular response ([place]) 05/12/2020   Cardiomyopathy ([name])    a. 12/2018 Echo: EF 40-45%, global HK. Asc Ao 3.7cm; b. 01/2019 [facility] MV: small, mild, fixed basal and mid antlat defect - scar vs artifact. Small, mild mid and apical inf minimally reversible defect, likely scar w/ peri-infarct ischemia. Coronary and Ao atherosclerosis; c. 12/2019 Echo: EF 40-45%, glob HK, Gr1 DD. Nl RV fxn. Sev dil LA. Mild MR. Mild-mod Ao sclerosis w/o stenosis. Asc Ao 42m.   Carotid arterial disease ([name]    a. 01/2020 RICA 1-39%, RCCA/RECA < 563% LICA 11-49% LCCA/LECA <50%.   Claudication ([facility]    a. 02/2020 ABI/TBI: R 1.08/0.95, L 1.00/0.70.   Complication of anesthesia    had to be woken up slowly as his bp was elevated when did this quickly   Diabetes mellitus without complication ([name])    ESRD ([name] stage renal disease) ([facility]    a. Peritoneal Dialysis pt.   Hyperlipidemia    Hypertension    Migraine    cluster   Neuromuscular disorder ([name])    left lower extrem neuropathy   Obstructive sleep apnea    no OSA since had facial surgery with dr. jKathyrn Sheriffin 1997   PAD (peripheral artery disease) ([facility]  Feb 2009   nonobstructing, renal angiogram [name])   Renal cell carcinoma 2004   left kidney heminephrectomy   Renal insufficiency    Sepsis ([name]) 11/06/2020   Stroke ([name])    a. 12/2019 MRI: Small acute infarcts involving the left cerebral hemisphere and several chronic infarcts.   TIA (transient ischemic  attack)    no residual but left leg and foot still feel heavy   tobacco abuse    Past Surgical History:  Procedure Laterality Date   CAPD INSERTION N/A 03/01/2019   Procedure: LAPAROSCOPIC INSERTION CONTINUOUS AMBULATORY PERITONEAL DIALYSIS  (CAPD) CATHETER;  Surgeon: Brett Huxley, MD;  Location: [name];  Service: Vascular;  Laterality: N/A;   CARDIAC CATHETERIZATION     Dr. Fletcher Torres did this to assess his renal artery   cyst removal  12/25/2015   Spine L4 and L5   heminephrectomy  2004   for renal cell CA   RENAL CRYOABLATION  Jan 2006   right kidney,  [name]   sciatica      Current Meds  Medication Sig   albuterol (VENTOLIN HFA) 108 (90 Base) MCG/ACT inhaler Inhale 2 puffs into the lungs every 6 (six) hours as needed for wheezing or shortness of breath.   amiodarone (PACERONE) 200 MG tablet TAKE 1 TABLET BY MOUTH ONCE DAILY   amLODipine (NORVASC) 10 MG tablet TAKE 1 TABLET BY MOUTH ONCE DAILY   apixaban (ELIQUIS) 5 MG TABS tablet Take 1 tablet (5 mg total) by mouth 2 (two) times daily.   b complex vitamins capsule Take 1 capsule by mouth daily.   bisoprolol (ZEBETA) 5 MG tablet Take 1 tablet (5 mg total) by mouth daily.   cloNIDine (CATAPRES - DOSED IN MG/24 HR) 0.1 mg/24hr patch Place 1 patch (0.1 mg total) onto the skin every Friday.   diphenhydrAMINE (BENADRYL) 25 MG tablet Take 25 mg by mouth every 6 (six) hours as needed for allergies.   doxazosin (CARDURA) 8 MG tablet Take 8 mg by mouth daily.   ferric citrate (AURYXIA) 1 GM 210 MG(Fe) tablet Take 1 tablet by mouth 3 (three) times daily.   fluticasone (FLONASE) 50 MCG/ACT nasal spray Place 2 sprays into both nostrils daily as needed for allergies or rhinitis.   gentamicin cream (GARAMYCIN) 0.1 % Apply 1 application topically daily.   multivitamin (RENA-VIT) TABS tablet Take 1 tablet by mouth at bedtime.   sevelamer (RENAGEL) 800 MG tablet Take 800 mg by mouth 3 (three) times daily with meals.   torsemide (DEMADEX) 100 MG  tablet Take 100 mg by mouth daily.   [DISCONTINUED] losartan (COZAAR) 100 MG tablet Take 0.5 tablets (50 mg total) by mouth daily.    Allergies: Irbesartan, Cymbalta [duloxetine hcl], Hydralazine, Imdur [isosorbide nitrate], Rosuvastatin, Atorvastatin, and Bystolic [nebivolol hcl]  Social History   Tobacco Use   Smoking status: Every Day    Packs/day: 1.00    Years: 50.00    Pack years: 50.00    Types: Cigarettes   Smokeless tobacco: Never  Vaping Use   Vaping Use: Former   Devices: tried but did not like  Substance Use Topics   Alcohol use: Not Currently   Drug use: No    Family History  Problem Relation Age of Onset   Hypertension Mother    Cancer Mother        breast   Aneurysm Mother    Coronary artery disease Father    Hypertension Father    Stroke Father 22   Heart  disease Father    Heart attack Father 74   Aneurysm Maternal Grandmother        brain   Aneurysm Paternal Grandmother        brain   Coronary artery disease Paternal Grandfather    Heart disease Brother        valvular heart disease   COPD Brother    Hypertension Brother    Stroke Paternal Uncle     Review of Systems: A 12-system review of systems was performed and was negative except as noted in the HPI.  --------------------------------------------------------------------------------------------------  Physical Exam: BP (!) 190/70 (BP Location: Left Arm, Patient Position: Sitting, Cuff Size: Normal)   Pulse (!) 55   Ht '5\' 6"'$  (1.676 m)   Wt 149 lb (67.6 kg)   BMI 24.05 kg/m   General:  NAD. Neck: No JVD or HJR. Lungs: Clear to auscultation bilaterally without wheezes or crackles. Heart: Bradycardic but regular without murmurs, rubs, or gallops. Abdomen: Soft, nontender, nondistended.  PD catheter in place. Extremities: No lower extremity edema.  EKG:  Sinus bradycardia with LVH, abnormal repolarization, and anteroseptal infarct.  No significant change from prior tracing on  09/09/2021.  Lab Results  Component Value Date   WBC 11.2 (H) 10/01/2021   HGB 10.9 (L) 10/01/2021   HCT 32.7 (L) 10/01/2021   MCV 90.3 10/01/2021   PLT 181 10/01/2021    Lab Results  Component Value Date   NA 136 10/01/2021   K 5.3 (H) 10/01/2021   CL 103 10/01/2021   CO2 23 10/01/2021   BUN 62 (H) 10/01/2021   CREATININE 8.99 (H) 10/01/2021   GLUCOSE 98 10/01/2021   ALT 9 09/09/2021    Lab Results  Component Value Date   CHOL 122 05/12/2020   HDL 25 (L) 05/12/2020   LDLCALC 61 05/12/2020   LDLDIRECT 134.0 05/11/2017   TRIG 180 (H) 05/12/2020   CHOLHDL 4.9 05/12/2020    --------------------------------------------------------------------------------------------------  ASSESSMENT AND PLAN: Cardiomyopathy: [name] appears euvolemic on exam.  BP still poorly controlled.  Chronic DOE and fatigue are stable, consistent with NYHA class III HF.  We again discussed R/LHC to better assess his volume status and definitely assess for ischemic substrate but have agreed to defer this for now.  We will increase losartan to 100 mg daily.  Continue remainder of medications, including bisoprolol and torsemide.  Ongoing volume management per nephrology.  Atrial fibrillation/flutter: [name] is maintaining sinus rhythm but is concerned that amiodarone is causing his pruritis.  We have agreed to hold this medication for a week, at which time [name] will update [name] on his symptoms.  If his pruritis resolves, we would need to readdress alternative rhythm-control strategies with Dr. Quentin Torres.  Continue apixaban for stroke prevention.  ESRD: Continue PD per nephrology.  Uncontrolled hypertension: Increase losartan to 100 mg daily.  Continue current doses of bisoprolol, amlodipine, clonidine, and doxazosin.  Follow-up: Return to clinic in 1 month.  [name], MD 10/17/2021 10:11 AM

## 2021-10-17 ENCOUNTER — Encounter: Payer: Self-pay | Admitting: Internal Medicine

## 2021-11-26 ENCOUNTER — Ambulatory Visit: Payer: Medicare PPO | Admitting: Cardiology

## 2021-11-26 ENCOUNTER — Encounter: Payer: Self-pay | Admitting: Medical

## 2021-11-26 VITALS — BP 178/70 | HR 56 | Ht 66.0 in | Wt 153.0 lb

## 2021-11-26 DIAGNOSIS — N186 End stage renal disease: Secondary | ICD-10-CM

## 2021-11-26 DIAGNOSIS — I429 Cardiomyopathy, unspecified: Secondary | ICD-10-CM | POA: Diagnosis not present

## 2021-11-26 DIAGNOSIS — J329 Chronic sinusitis, unspecified: Secondary | ICD-10-CM

## 2021-11-26 DIAGNOSIS — I4891 Unspecified atrial fibrillation: Secondary | ICD-10-CM

## 2021-11-26 DIAGNOSIS — I4892 Unspecified atrial flutter: Secondary | ICD-10-CM

## 2021-11-26 DIAGNOSIS — I10 Essential (primary) hypertension: Secondary | ICD-10-CM

## 2021-11-26 MED ORDER — AMIODARONE HCL 200 MG PO TABS: 200.0000 mg | ORAL_TABLET | Freq: Every day | ORAL | 0 refills | Status: DC

## 2021-11-26 NOTE — Patient Instructions (Signed)
Medication Instructions:  Your physician recommends that you continue on your current medications as directed. Please refer to the Current Medication list given to you today.  Amiodarone has been refilled today.  *If you need a refill on your cardiac medications before your next appointment, please call your pharmacy*   Lab Work: None ordered If you have labs (blood work) drawn today and your tests are completely normal, you will receive your results only by: [place] (if you have MyChart) OR A paper copy in the mail If you have any lab test that is abnormal or we need to change your treatment, we will call you to review the results.   Testing/Procedures: None ordered   Follow-Up: At [facility], you and your health needs are our priority.  As part of our continuing mission to provide you with exceptional heart care, we have created designated Provider Care Teams.  These Care Teams include your primary Cardiologist (physician) and Advanced Practice Providers (APPs -  Physician Assistants and Nurse Practitioners) who all work together to provide you with the care you need, when you need it.  We recommend signing up for the patient portal called "MyChart".  Sign up information is provided on this After Visit Summary.  MyChart is used to connect with patients for Virtual Visits (Telemedicine).  Patients are able to view lab/test results, encounter notes, upcoming appointments, etc.  Non-urgent messages can be sent to your provider as well.   To learn more about what you can do with MyChart, go to [email or web address].    Your next appointment:   8-10 week(s)  The format for your next appointment:   In Person  Provider:   [name], MD{     Other Instructions N/A  Important Information About Sugar

## 2021-11-26 NOTE — Progress Notes (Signed)
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  Past Medical History    Past Medical History:  Diagnosis Date   Acute on chronic systolic congestive heart failure [facility])    Adenocarcinoma of appendix [facility]) Jan 2006   right kidney, s/p cryoablation   Atrial fibrillation with rapid ventricular response ([place]) 05/12/2020   Cardiomyopathy ([place])    a. 12/2018 Echo: EF 40-45%, global HK. Asc Ao 3.7cm; b. 01/2019 Lexi MV: small, mild, fixed basal and mid antlat defect - scar vs artifact. Small, mild mid and apical inf minimally reversible defect, likely scar w/ peri-infarct ischemia. Coronary and Ao atherosclerosis; c. 12/2019 Echo: EF 40-45%, glob HK, Gr1 DD. Nl RV fxn. Sev dil LA. Mild MR. Mild-mod Ao sclerosis w/o stenosis. Asc Ao 26m.   Carotid arterial disease ([place]    a. 01/2020 RICA 1-39%, RCCA/RECA < 563% LICA 17-85% LCCA/LECA <50%.   Claudication ([place]    a. 02/2020 ABI/TBI: R 1.08/0.95, L 1.00/0.70.   Complication of anesthesia    had to be woken up slowly as his bp was elevated when did this quickly   Diabetes mellitus without complication (HCC)    ESRD (end stage renal disease) ([place]    a. Peritoneal Dialysis pt.   Hyperlipidemia    Hypertension    Migraine    cluster    Neuromuscular disorder ([place]    left lower extrem neuropathy   Obstructive sleep apnea    no OSA since had facial surgery with dr. jKathyrn Sheriffin 1997   PAD (peripheral artery disease) ([facility] Feb 2009   nonobstructing, renal angiogram ([name]   Renal cell carcinoma 2004   left kidney heminephrectomy   Renal insufficiency    Sepsis ([place] 11/06/2020   Stroke ([place]    a. 12/2019 MRI: Small acute infarcts involving the left cerebral hemisphere and several chronic infarcts.   TIA (transient ischemic attack)    no residual but left leg and foot still feel heavy   tobacco abuse    Past Surgical History:  Procedure Laterality Date   CAPD INSERTION N/A 03/01/2019   Procedure: LAPAROSCOPIC INSERTION CONTINUOUS AMBULATORY PERITONEAL DIALYSIS  (CAPD) CATHETER;  Surgeon: DAlgernon Huxley MD;  Location: [facility] ORS;  Service: Vascular;  Laterality: N/A;   CARDIAC CATHETERIZATION     Dr. AFletcher Anondid this to assess his renal artery   cyst removal  12/25/2015   Spine L4 and L5   heminephrectomy  2004   for renal cell CA   RENAL CRYOABLATION  Jan 2006   right kidney,  [name]   sciatica      Allergies  Allergies  Allergen Reactions   Irbesartan Other (See Comments)    hyperkalemia   Cymbalta [Duloxetine Hcl]    Hydralazine Other (See Comments)    headache   Imdur [Isosorbide Nitrate] Other (See Comments)    headache   Rosuvastatin     Breast  swelling/soreness   Atorvastatin Other (See Comments)    Muscle pain   Bystolic [Nebivolol Hcl]     Extreme fatigue     History of Present Illness    76 year old male with above complex past medical history including end-stage renal disease on peritoneal dialysis, cardiomyopathy with an EF of 40 to 45%, essential hypertension, hyperlipidemia, peripheral arterial disease, sleep apnea, renal cell carcinoma status post left heminephrectomy, and adenocarcinoma of the appendix.  His cardiac history has been significant for short dissection of the proximal left  subclavian artery which was determined at to be nonsurgical per vascular in August 2021 when he presented to the hospital with lack of coordination multiple small infarcts involving the left cerebellar hemisphere.  Subsequent event monitoring did not show atrial fibrillation.  In follow-up he was started on isosorbide mononitrate for blood pressure control and antianginal for worsening DOE as an anginal equivalent but it was stopped due to significant headaches.  Of note he was also noted to be intolerant of hydralazine due to headaches.  At that time an echocardiogram revealed EF 40 to 45%, global hypokinesis, grade 1 diastolic dysfunction, normal LV systolic function, and ventricular cavity size, severely dilated left atrium, and mild mitral regurgitation.  04/2020 he was hospitalized for acute on chronic HFrEF and hypertensive urgency.  At that time there was question of paroxysmal atrial fibrillation when EMS first arrived on scene this cannot be confirmed by any tracings.  Decision was made to defer anticoagulation at that time.  Echocardiogram done admission showed a stable cardiomyopathy with an EF of 4045%, no regional wall motion abnormalities, mildly dilated LV cavity size, moderate LVH, grade 1 diastolic dysfunction, normal RV systolic function and ventricular cavity size, moderately dilated left atrium,  mild mitral regurgitation, mild aortic insufficiency, mild to moderate aortic valve sclerosis without evidence of stenosis, dilated aortic root measuring 39 mm.  Seen in clinic consult for 10/30/2018 for fatigue and dyspnea.  At that point time carvedilol was switched to bisoprolol.  11/06/2020 he was hospitalized at [facility] due to rectal bleeding felt to be from peritoneal dialysis catheter.  He was managed conservatively.  Plavix was held during this admission as he previously been placed on Plavix for concern of CVA.  In 11/21/20 he was back to baseline at that point time cath was discussed given ongoing  dyspnea and was deferred due to recent GI bleed.  Renal artery Doppler for hypertension.  Renal ultrasound showed no significant narrowing of the renal arteries.  Again he was admitted on 11/30/2020 for new onset atrial fibrillation and COVID-19 infection.  He was discharged on Cardizem 60 mg 4 times a day and not on anticoagulation due to GI bleed.  He was again then readmitted to [facility] on 04/01/2021 for worsening shortness of breath he was also currently in A-fib with RVR.  He converted on IV diltiazem but was recommended not to stay on Dilt for extended period of time due to his cardiomyopathy and he was diuresed and subsequently discharged.  He followed up in clinic 03/25/2021 after being seen by Dr. Quentin Ore who recommended initiation of anticoagulation given the high risk for cardioembolic phenomenon.  Mr. Percell Miller was started on apixaban had a 14-day event monitor placed to assess his atrial fibrillation burden and was transition from oral clonidine to a clonidine patch.  04/24/2019 continue to have complaints of dizziness was seen in clinic and had his amiodarone decreased to 200 mg daily but continue his apixaban and his other blood pressure  medications.  20/long/23 he presented to the [facility] emergency department with elevated heart rates and generalized fatigue.  EKG showed atrial flutter with variable conduction he underwent cardioversion in the emergency department with restoration of sinus rhythm.  He was seen again on 10/16/2021 with uncontrolled hypertension losartan was increased to 100 mg daily.  I also discussed again the right and left heart catheterization to better assess his volume status and assess for ischemic substrate but again we have agreed to defer this at this time per the patient's request. She presents back in clinic today with continued complaints of shortness of breath that is unchanged from his previous visit, exertional fatigue, and continued pruritus from his dialysis.  He denies any  current chest pain, chest pressure, or recurrent palpitations.  He denies any recent hospitalizations or visits to the emergency department.  He does endorse continued sinus drainage with postnasal drip and coughing when he first wakes up in the morning.  He also has concerns of his peritoneal dialysis since he does 3 cycles through the night and has an increased amount of fluid left in his abdomen as he has not draining after his dwell time has been completed that he is following up with his nephrologist on.  Home Medications    Current Outpatient Medications  Medication Sig Dispense Refill   albuterol (VENTOLIN HFA) 108 (90 Base) MCG/ACT inhaler Inhale 2 puffs into the lungs every 6 (six) hours as needed for wheezing or shortness of breath. 8 g 11   amLODipine (NORVASC) 10 MG tablet TAKE 1 TABLET BY MOUTH ONCE DAILY 90 tablet 0   apixaban (ELIQUIS) 5 MG TABS tablet Take 1 tablet (5 mg total) by mouth 2 (two) times daily. 60 tablet 11   Ascorbic Acid 500 MG CHEW Chew 1 tablet by mouth daily.     b complex vitamins capsule Take 1 capsule by mouth daily.     bisoprolol (ZEBETA) 5 MG tablet Take 1 tablet (5 mg total) by mouth daily. 30 tablet 0   cloNIDine (CATAPRES - DOSED IN MG/24 HR) 0.1 mg/24hr patch Place 1 patch (0.1 mg total) onto the skin every Friday. 12 patch 3   diphenhydrAMINE (BENADRYL) 25 MG tablet Take 25 mg by mouth every 6 (six) hours as needed for allergies.     doxazosin (CARDURA) 8 MG tablet Take 8 mg by mouth daily.     ferric citrate (AURYXIA) 1 GM 210 MG(Fe) tablet Take 1 tablet by mouth 3 (three) times daily.     fluticasone (FLONASE) 50 MCG/ACT nasal spray Place 2 sprays into both nostrils daily as needed for allergies or rhinitis.     gentamicin cream (GARAMYCIN) 0.1 % Apply 1 application topically daily. 15 g 0   hydrOXYzine (ATARAX) 10 MG tablet Take 10 mg by mouth 3 (three) times daily.     losartan (COZAAR) 100 MG tablet Take 1 tablet (100 mg total) by mouth daily. 90  tablet 1   multivitamin (RENA-VIT) TABS tablet Take 1 tablet by mouth at bedtime.     sevelamer (RENAGEL) 800 MG tablet Take 800 mg by mouth 3 (three) times daily with meals.     torsemide (DEMADEX) 100 MG tablet Take 100 mg by mouth daily.     amiodarone (PACERONE) 200 MG tablet Take 1 tablet (200 mg total) by mouth daily. 90 tablet 0   No current facility-administered medications for this visit.     Family History    Family History  Problem Relation Age of  Onset   Hypertension Mother    Cancer Mother        breast   Aneurysm Mother    Coronary artery disease Father    Hypertension Father    Stroke Father 69   Heart disease Father    Heart attack Father 4   Aneurysm Maternal Grandmother        brain   Aneurysm Paternal Grandmother        brain   Coronary artery disease Paternal Grandfather    Heart disease Brother        valvular heart disease   COPD Brother    Hypertension Brother    Stroke Paternal Uncle    He indicated that his mother is deceased. He indicated that his father is deceased. He indicated that his brother is deceased. He indicated that his maternal grandmother is deceased. He indicated that his maternal grandfather is deceased. He indicated that the status of his paternal grandmother is unknown. He indicated that the status of his paternal grandfather is unknown. He indicated that his paternal uncle is deceased.  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  Tobacco Use   Smoking status: Every Day    Packs/day: 1.00    Years: 50.00    Total pack years: 50.00    Types: Cigarettes   Smokeless tobacco: Never  Vaping Use   Vaping Use: Former   Devices: tried but did not like  Substance and Sexual Activity    Alcohol use: Not Currently   Drug use: No   Sexual activity: Yes  Other Topics Concern   Not on file  Social History Narrative   Not on file   Social Determinants of Health   Financial Resource Strain: Low Risk  (04/21/2021)   Overall Financial Resource Strain (CARDIA)    Difficulty of Paying Living Expenses: Not hard at all  Food Insecurity: No Food Insecurity (04/21/2021)   Hunger Vital Sign    Worried About Running Out of Food in the Last Year: Never true    [place] in the Last Year: Never true  Transportation Needs: No Transportation Needs (04/21/2021)   PRAPARE - Hydrologist (Medical): No    Lack of Transportation (Non-Medical): No  Physical Activity: Insufficiently Active (08/15/2020)   Exercise Vital Sign    Days of Exercise per Week: 3 days    Minutes of Exercise per Session: 20 min  Stress: No Stress Concern Present (04/21/2021)   [place]    Feeling of Stress : Not at all  Social Connections: Unknown (04/21/2021)   Social Connection and Isolation Panel [NHANES]    Frequency of Communication with Friends and Family: Not on file    Frequency of Social Gatherings with Friends and Family: Not on file    Attends Religious Services: Not on file    Active Member of Clubs or Organizations: Not on file    Attends Archivist Meetings: Not on file    Marital Status: Married  Intimate Partner Violence: Not At Risk (04/21/2021)   Humiliation, Afraid, Rape, and Kick questionnaire    Fear of Current or Ex-Partner: No  Emotionally Abused: No    Physically Abused: No    Sexually Abused: No     Review of Systems    General:  No chills, fever, night sweats or weight changes. Endorses fatigue. Cardiovascular:  No chest pain,edema, orthopnea, palpitations, paroxysmal nocturnal dyspnea, endorses dyspnea on exertion Dermatological: No rash, lesions/masses. Endorses  itching.  Respiratory: No cough, endorses dyspnea on exertion Urologic: No hematuria, dysuria Abdominal:   No nausea, vomiting, diarrhea, bright red blood per rectum, melena, or hematemesis Neurologic:  No visual changes, wkns, changes in mental status. All other systems reviewed and are otherwise negative except as noted above.     Physical Exam    VS:  BP (!) 178/70 (BP Location: Left Arm, Patient Position: Sitting, Cuff Size: Normal) Comment: AFTER EKG  Pulse (!) 56   Ht '5\' 6"'$  (1.676 m)   Wt 153 lb (69.4 kg)   BMI 24.69 kg/m  , BMI Body mass index is 24.69 kg/m.     GEN: Well nourished, well developed, in no acute distress. HEENT: normal. Neck: Supple, no JVD, carotid bruits, or masses. Cardiac: RRR, no murmurs, rubs, or gallops. No clubbing, cyanosis, trace edema bilaterally pretibial.  Radials/DP/PT 2+ and equal bilaterally.  Respiratory:  Respirations regular and unlabored, clear to auscultation bilaterally. GI: Soft, nontender, nondistended, BS + x 4. MS: no deformity or atrophy. Skin: warm and dry, no rash. Dry and ashen.  Neuro:  Strength and sensation are intact. Psych: Normal affect.  Accessory Clinical Findings    ECG personally reviewed by me today-sinus bradycardia with rate of 56 and a left anterior fascicular block, LVH- No acute changes  Lab Results  Component Value Date   WBC 11.2 (H) 10/01/2021   HGB 10.9 (L) 10/01/2021   HCT 32.7 (L) 10/01/2021   MCV 90.3 10/01/2021   PLT 181 10/01/2021   Lab Results  Component Value Date   CREATININE 8.99 (H) 10/01/2021   BUN 62 (H) 10/01/2021   NA 136 10/01/2021   K 5.3 (H) 10/01/2021   CL 103 10/01/2021   CO2 23 10/01/2021   Lab Results  Component Value Date   ALT 9 09/09/2021   AST 7 09/09/2021   ALKPHOS 128 (H) 09/09/2021   BILITOT <0.2 09/09/2021   Lab Results  Component Value Date   CHOL 122 05/12/2020   HDL 25 (L) 05/12/2020   LDLCALC 61 05/12/2020   LDLDIRECT 134.0 05/11/2017   TRIG 180 (H)  05/12/2020   CHOLHDL 4.9 05/12/2020    Lab Results  Component Value Date   HGBA1C 6.9 (H) 11/21/2020    Assessment & Plan   1.  Cardiomyopathy continues to appear euvolemic on exam.  Chronic DOE and fatigue are stable consistence with NYHA class III heart failure.  We discussed the right/left heart catheterization to better assess volume status again today and he would like to hold off until return appointment to discuss any further testing.  As he states he has been told in the past if he continues with having the left heart catheterization he will have to get IVP dye which would damage his kidneys further.  Did advise him of the left heart catheterization uses a small amount of IVP dye but there is no IVP dye that is required on the right side heart catheterization which he was unaware of.  Echocardiogram was completed on/6/22 that showed an LVEF of 45 to 50%, global hypokinesis, moderate left ventricular hypertrophy, grade 1 diastolic dysfunction impaired relaxation, mild to moderately dilated  left atrium, mild MR, mild AR. Mild to moderate aortic valve sclerosis/calcification is present without evidence of aortic stenosis.  2.Paroxysmal atrial fibrillation/atrial flutter currently maintaining sinus rhythm sinus bradycardia as electrocardiogram showed today.  He is continued on amiodarone.  He did do a trial hold of his amiodarone x1 week with no improvement in his pruritus which started he restarted his medication at that time.  He also remains on apixaban for stroke prevention with a CHA2DS2-VASc score of 6  3.Hypertension today was 178/70.  He has restarted his amiodarone, continue losartan 100 mg daily, was advised to continue his doses of buspirone, amlodipine, his clonidine patch, and doxazosin  4.ESRD on PD ongoing fluid management per nephrology  5.Chronic sinus drainage advised to try low dose allergy medications without a decongestant. Advised to follow up with his PCP and they may  consider sinus CT for recurrent/chronic sinus issues.  Disposition: Return to clinic in 6 to 8 weeks to see Dr. Saunders Revel.  If continuation of symptoms revisit right and left heart catheterization.  Elye Harmsen, NP 11/26/2021, 1:54 PM

## 2021-11-27 NOTE — Addendum Note (Signed)
Addended by: Britt Bottom on: 11/27/2021 11:52 AM   Modules accepted: Orders

## 2021-12-09 ENCOUNTER — Other Ambulatory Visit: Payer: Self-pay | Admitting: Internal Medicine

## 2022-01-01 ENCOUNTER — Telehealth (INDEPENDENT_AMBULATORY_CARE_PROVIDER_SITE_OTHER): Payer: Self-pay

## 2022-01-01 NOTE — Telephone Encounter (Signed)
Pts wife left message wanting to know when her husbands appt is for port placement.  Lavella Lemons nor Mickel Baas know anything about this.  I called pts wife back to let her know that we need an order/referral faxed over before we can schedule this appt she stated verbal understanding.

## 2022-01-17 ENCOUNTER — Emergency Department: Payer: Medicare PPO

## 2022-01-17 ENCOUNTER — Other Ambulatory Visit: Payer: Self-pay

## 2022-01-17 ENCOUNTER — Encounter: Payer: Self-pay | Admitting: Emergency Medicine

## 2022-01-17 ENCOUNTER — Observation Stay
Admission: EM | Admit: 2022-01-17 | Discharge: 2022-01-18 | Disposition: A | Discharge status: Home / Self Care | Payer: Medicare PPO | Attending: Osteopathic Medicine | Admitting: Osteopathic Medicine

## 2022-01-17 DIAGNOSIS — Z85038 Personal history of other malignant neoplasm of large intestine: Secondary | ICD-10-CM | POA: Insufficient documentation

## 2022-01-17 DIAGNOSIS — Z7901 Long term (current) use of anticoagulants: Secondary | ICD-10-CM | POA: Diagnosis not present

## 2022-01-17 DIAGNOSIS — Z79899 Other long term (current) drug therapy: Secondary | ICD-10-CM | POA: Diagnosis not present

## 2022-01-17 DIAGNOSIS — N186 End stage renal disease: Secondary | ICD-10-CM | POA: Diagnosis not present

## 2022-01-17 DIAGNOSIS — I132 Hypertensive heart and chronic kidney disease with heart failure and with stage 5 chronic kidney disease, or end stage renal disease: Secondary | ICD-10-CM | POA: Insufficient documentation

## 2022-01-17 DIAGNOSIS — I480 Paroxysmal atrial fibrillation: Secondary | ICD-10-CM | POA: Diagnosis not present

## 2022-01-17 DIAGNOSIS — I5023 Acute on chronic systolic (congestive) heart failure: Secondary | ICD-10-CM | POA: Diagnosis not present

## 2022-01-17 DIAGNOSIS — C642 Malignant neoplasm of left kidney, except renal pelvis: Secondary | ICD-10-CM | POA: Diagnosis not present

## 2022-01-17 DIAGNOSIS — I4892 Unspecified atrial flutter: Secondary | ICD-10-CM | POA: Insufficient documentation

## 2022-01-17 DIAGNOSIS — E1122 Type 2 diabetes mellitus with diabetic chronic kidney disease: Secondary | ICD-10-CM | POA: Insufficient documentation

## 2022-01-17 DIAGNOSIS — E875 Hyperkalemia: Secondary | ICD-10-CM | POA: Diagnosis not present

## 2022-01-17 DIAGNOSIS — I10 Essential (primary) hypertension: Secondary | ICD-10-CM | POA: Diagnosis present

## 2022-01-17 DIAGNOSIS — D631 Anemia in chronic kidney disease: Secondary | ICD-10-CM | POA: Diagnosis not present

## 2022-01-17 DIAGNOSIS — E782 Mixed hyperlipidemia: Secondary | ICD-10-CM

## 2022-01-17 DIAGNOSIS — E8770 Fluid overload, unspecified: Principal | ICD-10-CM | POA: Insufficient documentation

## 2022-01-17 DIAGNOSIS — F17210 Nicotine dependence, cigarettes, uncomplicated: Secondary | ICD-10-CM | POA: Insufficient documentation

## 2022-01-17 DIAGNOSIS — L299 Pruritus, unspecified: Secondary | ICD-10-CM | POA: Diagnosis not present

## 2022-01-17 DIAGNOSIS — Z992 Dependence on renal dialysis: Secondary | ICD-10-CM | POA: Diagnosis not present

## 2022-01-17 DIAGNOSIS — C649 Malignant neoplasm of unspecified kidney, except renal pelvis: Secondary | ICD-10-CM | POA: Diagnosis present

## 2022-01-17 DIAGNOSIS — Z85528 Personal history of other malignant neoplasm of kidney: Secondary | ICD-10-CM

## 2022-01-17 DIAGNOSIS — E8779 Other fluid overload: Secondary | ICD-10-CM

## 2022-01-17 DIAGNOSIS — I5020 Unspecified systolic (congestive) heart failure: Secondary | ICD-10-CM | POA: Diagnosis present

## 2022-01-17 DIAGNOSIS — N184 Chronic kidney disease, stage 4 (severe): Secondary | ICD-10-CM | POA: Diagnosis not present

## 2022-01-17 DIAGNOSIS — R002 Palpitations: Secondary | ICD-10-CM | POA: Diagnosis present

## 2022-01-17 DIAGNOSIS — Z8673 Personal history of transient ischemic attack (TIA), and cerebral infarction without residual deficits: Secondary | ICD-10-CM | POA: Diagnosis not present

## 2022-01-17 DIAGNOSIS — I4891 Unspecified atrial fibrillation: Secondary | ICD-10-CM | POA: Diagnosis present

## 2022-01-17 DIAGNOSIS — E785 Hyperlipidemia, unspecified: Secondary | ICD-10-CM | POA: Diagnosis present

## 2022-01-17 DIAGNOSIS — F1910 Other psychoactive substance abuse, uncomplicated: Secondary | ICD-10-CM | POA: Diagnosis present

## 2022-01-17 LAB — BRAIN NATRIURETIC PEPTIDE: B Natriuretic Peptide: 3559.8 pg/mL — ABNORMAL HIGH (ref 0.0–100.0)

## 2022-01-17 LAB — CBC
HCT: 28 % — ABNORMAL LOW (ref 39.0–52.0)
Hemoglobin: 8.8 g/dL — ABNORMAL LOW (ref 13.0–17.0)
MCH: 29.1 pg (ref 26.0–34.0)
MCHC: 31.4 g/dL (ref 30.0–36.0)
MCV: 92.7 fL (ref 80.0–100.0)
Platelets: 145 10*3/uL — ABNORMAL LOW (ref 150–400)
RBC: 3.02 MIL/uL — ABNORMAL LOW (ref 4.22–5.81)
RDW: 13.9 % (ref 11.5–15.5)
WBC: 7.3 10*3/uL (ref 4.0–10.5)
nRBC: 0 % (ref 0.0–0.2)

## 2022-01-17 LAB — TROPONIN I (HIGH SENSITIVITY)
Troponin I (High Sensitivity): 33 ng/L — ABNORMAL HIGH (ref <18)
Troponin I (High Sensitivity): 33 ng/L — ABNORMAL HIGH (ref <18)

## 2022-01-17 LAB — BASIC METABOLIC PANEL
Anion gap: 8 (ref 5–15)
BUN: 55 mg/dL — ABNORMAL HIGH (ref 8–23)
CO2: 24 mmol/L (ref 22–32)
Calcium: 8.4 mg/dL — ABNORMAL LOW (ref 8.9–10.3)
Chloride: 107 mmol/L (ref 98–111)
Creatinine, Ser: 8.94 mg/dL — ABNORMAL HIGH (ref 0.61–1.24)
GFR, Estimated: 6 mL/min — ABNORMAL LOW (ref >60)
Glucose, Bld: 157 mg/dL — ABNORMAL HIGH (ref 70–99)
Potassium: 5.8 mmol/L — ABNORMAL HIGH (ref 3.5–5.1)
Sodium: 139 mmol/L (ref 135–145)

## 2022-01-17 MED ORDER — SENNOSIDES-DOCUSATE SODIUM 8.6-50 MG PO TABS: 1.0000 | ORAL_TABLET | Freq: Every evening | ORAL | Status: DC | PRN

## 2022-01-17 MED ORDER — ALBUTEROL SULFATE (2.5 MG/3ML) 0.083% IN NEBU: 2.5000 mg | INHALATION_SOLUTION | Freq: Four times a day (QID) | RESPIRATORY_TRACT | Status: DC | PRN

## 2022-01-17 MED ORDER — NICOTINE 21 MG/24HR TD PT24: 21.0000 mg | MEDICATED_PATCH | Freq: Every day | TRANSDERMAL | Status: DC | PRN

## 2022-01-17 MED ORDER — ACETAMINOPHEN 650 MG RE SUPP: 650.0000 mg | Freq: Four times a day (QID) | RECTAL | Status: DC | PRN

## 2022-01-17 MED ORDER — ASCORBIC ACID 500 MG PO TABS
500.0000 mg | ORAL_TABLET | Freq: Every day | ORAL | Status: DC
  Administered 2022-01-18: 500 mg via ORAL
  Filled 2022-01-17: qty 1

## 2022-01-17 MED ORDER — AMIODARONE HCL 200 MG PO TABS
200.0000 mg | ORAL_TABLET | Freq: Every day | ORAL | Status: DC
  Administered 2022-01-18: 200 mg via ORAL
  Filled 2022-01-17: qty 1

## 2022-01-17 MED ORDER — SODIUM ZIRCONIUM CYCLOSILICATE 10 G PO PACK
10.0000 g | PACK | Freq: Every day | ORAL | Status: AC
  Administered 2022-01-17 – 2022-01-18 (×2): 10 g via ORAL
  Filled 2022-01-17 (×2): qty 1

## 2022-01-17 MED ORDER — FERRIC CITRATE 1 GM 210 MG(FE) PO TABS
210.0000 mg | ORAL_TABLET | Freq: Three times a day (TID) | ORAL | Status: DC
  Administered 2022-01-17 – 2022-01-18 (×2): 210 mg via ORAL
  Filled 2022-01-17 (×3): qty 1

## 2022-01-17 MED ORDER — RENA-VITE PO TABS
1.0000 | ORAL_TABLET | Freq: Every day | ORAL | Status: DC
Start: 2022-01-17 — End: 2022-01-18
  Administered 2022-01-17: 1 via ORAL
  Filled 2022-01-17: qty 1

## 2022-01-17 MED ORDER — CLONIDINE HCL 0.1 MG/24HR TD PTWK: 0.1000 mg | MEDICATED_PATCH | TRANSDERMAL | Status: DC

## 2022-01-17 MED ORDER — DELFLEX-LC/2.5% DEXTROSE 394 MOSM/L IP SOLN
INTRAPERITONEAL | Status: DC
  Filled 2022-01-17: qty 3000

## 2022-01-17 MED ORDER — SEVELAMER CARBONATE 800 MG PO TABS
800.0000 mg | ORAL_TABLET | Freq: Three times a day (TID) | ORAL | Status: DC
  Administered 2022-01-17 – 2022-01-18 (×3): 800 mg via ORAL
  Filled 2022-01-17 (×3): qty 1

## 2022-01-17 MED ORDER — ONDANSETRON HCL 4 MG/2ML IJ SOLN: 4.0000 mg | Freq: Four times a day (QID) | INTRAMUSCULAR | Status: DC | PRN

## 2022-01-17 MED ORDER — ONDANSETRON HCL 4 MG PO TABS: 4.0000 mg | ORAL_TABLET | Freq: Four times a day (QID) | ORAL | Status: DC | PRN

## 2022-01-17 MED ORDER — DIPHENHYDRAMINE HCL 25 MG PO CAPS: 25.0000 mg | ORAL_CAPSULE | Freq: Four times a day (QID) | ORAL | Status: DC | PRN

## 2022-01-17 MED ORDER — ACETAMINOPHEN 325 MG PO TABS: 650.0000 mg | ORAL_TABLET | Freq: Four times a day (QID) | ORAL | Status: DC | PRN

## 2022-01-17 MED ORDER — HEPARIN SODIUM (PORCINE) 5000 UNIT/ML IJ SOLN: 5000.0000 [IU] | Freq: Three times a day (TID) | INTRAMUSCULAR | Status: DC

## 2022-01-17 MED ORDER — HYDROXYZINE HCL 25 MG PO TABS
25.0000 mg | ORAL_TABLET | Freq: Three times a day (TID) | ORAL | Status: DC
  Administered 2022-01-17 – 2022-01-18 (×2): 25 mg via ORAL
  Filled 2022-01-17 (×2): qty 1

## 2022-01-17 MED ORDER — DOXAZOSIN MESYLATE 4 MG PO TABS
8.0000 mg | ORAL_TABLET | Freq: Every day | ORAL | Status: DC
  Administered 2022-01-18: 8 mg via ORAL
  Filled 2022-01-17: qty 2
  Filled 2022-01-17: qty 1

## 2022-01-17 MED ORDER — GENTAMICIN SULFATE 0.1 % EX CREA
1.0000 | TOPICAL_CREAM | Freq: Every day | CUTANEOUS | Status: DC
  Administered 2022-01-18: 1 via TOPICAL
  Filled 2022-01-17: qty 15

## 2022-01-17 MED ORDER — B COMPLEX-C PO TABS
1.0000 | ORAL_TABLET | Freq: Every day | ORAL | Status: DC
  Administered 2022-01-18: 1 via ORAL
  Filled 2022-01-17: qty 1

## 2022-01-17 MED ORDER — BISOPROLOL FUMARATE 5 MG PO TABS
5.0000 mg | ORAL_TABLET | Freq: Two times a day (BID) | ORAL | Status: DC
  Administered 2022-01-17: 5 mg via ORAL
  Filled 2022-01-17 (×3): qty 1

## 2022-01-17 MED ORDER — FUROSEMIDE 10 MG/ML IJ SOLN
80.0000 mg | Freq: Once | INTRAMUSCULAR | Status: AC
  Administered 2022-01-17: 80 mg via INTRAVENOUS
  Filled 2022-01-17: qty 8

## 2022-01-17 MED ORDER — APIXABAN 5 MG PO TABS
5.0000 mg | ORAL_TABLET | Freq: Two times a day (BID) | ORAL | Status: DC
  Administered 2022-01-17 – 2022-01-18 (×2): 5 mg via ORAL
  Filled 2022-01-17 (×2): qty 1

## 2022-01-17 MED ORDER — FLUTICASONE PROPIONATE 50 MCG/ACT NA SUSP: 2.0000 | Freq: Every day | NASAL | Status: DC | PRN

## 2022-01-17 MED ORDER — LOSARTAN POTASSIUM 50 MG PO TABS
100.0000 mg | ORAL_TABLET | Freq: Every day | ORAL | Status: DC
  Administered 2022-01-18: 100 mg via ORAL
  Filled 2022-01-17: qty 2

## 2022-01-17 NOTE — ED Notes (Signed)
EDP at bedside  

## 2022-01-17 NOTE — Assessment & Plan Note (Signed)
-   Resumed bisoprolol 5 mg p.o. twice daily, doxazosin 8 mg daily, losartan 100 mg daily, clonidine 0.1 mg transdermal every 7 days, first dose 01/22/2022

## 2022-01-17 NOTE — Assessment & Plan Note (Addendum)
-   Status post furosemide 80 mg IV per EDP - Additional correction via dialysis - BMP in the a.m.

## 2022-01-17 NOTE — H&P (Addendum)
History and Physical   [name] TMH:[id number] DOB: 1946/01/30 DOA: 01/17/2022  PCP: Crecencio Mc, MD  Outpatient Specialists: Dr. Saunders Revel, [facility] cardiology Patient coming from: Home  I have personally briefly reviewed patient's old medical records in [place].  Chief Concern: Shortness of breath and palpitations  HPI: [name] is a 76 year old male with hyperlipidemia, hypertension, end-stage renal disease on peritoneal dialysis, cardiomyopathy, NYHA Class III HF, history of left renal cell carcinoma status post left heminephrectomy, history of CVA, paroxysmal atrial fib and atrial flutter, chronic pneumoperitoneum, who presents emergency department for chief concerns of palpitations and shortness of breath since Thursday, 01/14/2022.  Initial vitals in the emergency department showed temperature of 97.4, respiration rate of 28, heart rate of 108, blood pressure 135/86, SPO2 100% on room air.  Serum sodium is 139, potassium 5.8, chloride 107, bicarb 24, BUN of 55, serum creatinine of 8.94, GFR of 6, nonfasting blood glucose 157, WBC 7.3, hemoglobin 8.8, platelets of 145.  BNP was elevated at 3559.8. High sensitive troponin was 33.  EDP discussed case with [facility] cardiology on-call, Dr. Galen Manila, who recommends changing his bisoprolol to 5 mg p.o. twice daily from 5 mg daily.  ED treatment: Furosemide 80 mg IV one-time dose, bisoprolol 5 mg p.o. twice daily ------------------------------------- At bedside patient was able to tell me his full name, age, current calendar year, and current location.  He states he has been having shortness of breath and palpitations since Thursday.  He denies any known sick contacts.  He endorses cough that is productive of white sputum.  He denies known fever, nausea, vomiting, chest pain, abdominal pain, swelling in his lower extremities.  He does endorse baseline poor appetite.  He reports he just got a new peritoneal dialysis machine  about 1 week ago.  He reports that over the last 2 or 3 days, the dialysis machine has says low drain, and by the time it gets to the third coil, he does not believe it is functioning appropriately.  Social history: He lives at home with his wife.  He is a current tobacco user, smoking 1 pack/day.  He states he is not ready to quit.  He denies EtOH and recreational drug use.  He is retired and formerly worked in [employer], clearing land for homes.  ROS: Constitutional: no weight change, no fever ENT/Mouth: no sore throat, no rhinorrhea Eyes: no eye pain, no vision changes Cardiovascular: no chest pain, + dyspnea,  no edema, no palpitations Respiratory: + cough, + sputum, no wheezing Gastrointestinal: no nausea, no vomiting, no diarrhea, no constipation Genitourinary: no urinary incontinence, no dysuria, no hematuria Musculoskeletal: no arthralgias, no myalgias Skin: no skin lesions, no pruritus, Neuro: + weakness, no loss of consciousness, no syncope Psych: no anxiety, no depression, + decrease appetite Heme/Lymph: no bruising, no bleeding  ED Course: Discussed with emergency medicine provider, patient requiring hospitalization for chief concerns of volume overload.  Assessment/Plan  Principal Problem:   Volume overload Active Problems:   Hyperlipidemia   History of renal cell carcinoma   tobacco abuse   Hyperkalemia   Adenocarcinoma, renal cell (HCC)   Anemia of chronic kidney failure, stage 4 (severe) (HCC)   Renal cell carcinoma (HCC)   HFrEF (heart failure with reduced ejection fraction) (HCC)   PAF (paroxysmal atrial fibrillation) (HCC)   ESRD on peritoneal dialysis ([place])   Resistant hypertension   Diabetes mellitus with ESRD (end-stage renal disease) ([place])   Atrial fibrillation and flutter ([place])  Pruritus   Assessment and Plan:  * Volume overload - Query new machine malfunctioning versus incorrect adjustments - Nephrology has been consulted for additional  dialysis - Recommend patient contact dialysis machine prior to discharge to ensure appropriate functioning - Strict I's and O's - Admit to telemetry cardiac, observation  Pruritus - Did not resolve with discontinuation of amiodarone in May 2023 - I recommended patient to discuss with cardiologist outpatient on resumption of amiodarone for heart rhythm control  Atrial fibrillation and flutter (HCC) - Eliquis 5 mg p.o. twice daily and amiodarone 200 mg daily resumed - Bisoprolol 5 mg p.o. twice daily initiated by EDP at the recommendation of [facility] cardiologist, Dr. Garen Lah - Doxazosin 8 mg daily resumed  Resistant hypertension - Resumed bisoprolol 5 mg p.o. twice daily, doxazosin 8 mg daily, losartan 100 mg daily, clonidine 0.1 mg transdermal every 7 days, first dose 01/22/2022  ESRD on peritoneal dialysis [facility]) - Nephrology specialist has been consulted, Dr. Theador Hawthorne via secure chat  Renal cell carcinoma ([place]) - Left renal cell carcinoma, status post left heminephrectomy  Anemia of chronic kidney failure, stage 4 (severe) (HCC) - Baseline hemoglobin has been 10.9-11.9 over the past 4 months and is 8.8-11.9 over the last 9 months - Hemoglobin on admission is 8.8 - Negative for signs of bleeding   Hyperkalemia - Status post furosemide 80 mg IV per EDP - Additional correction via dialysis - BMP in the a.m.  tobacco abuse - Patient is not ready to quit - Nicotine patch as needed ordered for nicotine craving  Chart reviewed.   01/27/2021 Complete echo read as ejection fraction 45 to 50%, left ventricular demonstrates global hypokinesis.  Grade 1 diastolic dysfunction.  There is moderate left ventricular hypertrophy.  DVT prophylaxis: Eliquis Code Status: Full code Diet: Renal Family Communication: A phone call was offered, patient declined stating that his wife already knows he is here and that he called her Disposition Plan: Ending clinical course Consults called:  Nephrology Admission status: Telemetry cardiac, observation  Past Medical History:  Diagnosis Date   Acute on chronic systolic congestive heart failure ([place])    Adenocarcinoma of appendix [facility]) Jan 2006   right kidney, s/p cryoablation   Atrial fibrillation with rapid ventricular response ([place]) 05/12/2020   Cardiomyopathy ([place])    a. 12/2018 Echo: EF 40-45%, global HK. Asc Ao 3.7cm; b. 01/2019 Lexi MV: small, mild, fixed basal and mid antlat defect - scar vs artifact. Small, mild mid and apical inf minimally reversible defect, likely scar w/ peri-infarct ischemia. Coronary and Ao atherosclerosis; c. 12/2019 Echo: EF 40-45%, glob HK, Gr1 DD. Nl RV fxn. Sev dil LA. Mild MR. Mild-mod Ao sclerosis w/o stenosis. Asc Ao 77m.   Carotid arterial disease ([place]    a. 01/2020 RICA 1-39%, RCCA/RECA < 562% LICA 19-47% LCCA/LECA <50%.   Claudication ([place]    a. 02/2020 ABI/TBI: R 1.08/0.95, L 1.00/0.70.   Complication of anesthesia    had to be woken up slowly as his bp was elevated when did this quickly   Diabetes mellitus without complication (HCC)    ESRD (end stage renal disease) ([place]    a. Peritoneal Dialysis pt.   Hyperlipidemia    Hypertension    Migraine    cluster   Neuromuscular disorder ([place]    left lower extrem neuropathy   Obstructive sleep apnea    no OSA since had facial surgery with dr. jKathyrn Sheriffin 1997   PAD (peripheral artery disease) ([facility] Feb 2009  nonobstructing, renal angiogram ([place])   Renal cell carcinoma 2004   left kidney heminephrectomy   Renal insufficiency    Sepsis ([place]) 11/06/2020   Stroke ([place])    a. 12/2019 MRI: Small acute infarcts involving the left cerebral hemisphere and several chronic infarcts.   TIA (transient ischemic attack)    no residual but left leg and foot still feel heavy   tobacco abuse    Past Surgical History:  Procedure Laterality Date   CAPD INSERTION N/A 03/01/2019   Procedure: LAPAROSCOPIC INSERTION CONTINUOUS AMBULATORY PERITONEAL  DIALYSIS  (CAPD) CATHETER;  Surgeon: Algernon Huxley, MD;  Location: [facility] ORS;  Service: Vascular;  Laterality: N/A;   CARDIAC CATHETERIZATION     Dr. [name] did this to assess his renal artery   cyst removal  12/25/2015   Spine L4 and L5   heminephrectomy  2004   for renal cell CA   RENAL CRYOABLATION  Jan 2006   right kidney,  [name]   sciatica     Social History:  reports that he has been smoking cigarettes. He has a 50.00 pack-year smoking history. He has never used smokeless tobacco. He reports that he does not currently use alcohol. He reports that he does not use drugs.  Allergies  Allergen Reactions   Irbesartan Other (See Comments)    hyperkalemia   Cymbalta [Duloxetine Hcl]    Hydralazine Other (See Comments)    headache   Imdur [Isosorbide Nitrate] Other (See Comments)    headache   Rosuvastatin     Breast swelling/soreness   Atorvastatin Other (See Comments)    Muscle pain   Bystolic [Nebivolol Hcl]     Extreme fatigue    Family History  Problem Relation Age of Onset   Hypertension Mother    Cancer Mother        breast   Aneurysm Mother    Coronary artery disease Father    Hypertension Father    Stroke Father 42   Heart disease Father    Heart attack Father 42   Aneurysm Maternal Grandmother        brain   Aneurysm Paternal Grandmother        brain   Coronary artery disease Paternal Grandfather    Heart disease Brother        valvular heart disease   COPD Brother    Hypertension Brother    Stroke Paternal Uncle    Family history: Family history reviewed and not pertinent.  Prior to Admission medications   Medication Sig Start Date End Date Taking? Authorizing Provider  albuterol (VENTOLIN HFA) 108 (90 Base) MCG/ACT inhaler Inhale 2 puffs into the lungs every 6 (six) hours as needed for wheezing or shortness of breath. 03/25/21   Vickie Epley, MD  amiodarone (PACERONE) 200 MG tablet Take 1 tablet (200 mg total) by mouth daily. 11/26/21   Gerrie Nordmann, NP  amLODipine (NORVASC) 10 MG tablet TAKE 1 TABLET BY MOUTH ONCE DAILY 03/23/21   Crecencio Mc, MD  apixaban (ELIQUIS) 5 MG TABS tablet Take 1 tablet (5 mg total) by mouth 2 (two) times daily. 03/25/21   Vickie Epley, MD  Ascorbic Acid 500 MG CHEW Chew 1 tablet by mouth daily.    [provider]  b complex vitamins capsule Take 1 capsule by mouth daily.    [provider]  bisoprolol (ZEBETA) 5 MG tablet TAKE 1 TABLET BY MOUTH ONCE DAILY 12/09/21   End, Harrell Gave, MD  cloNIDine (  CATAPRES - DOSED IN MG/24 HR) 0.1 mg/24hr patch Place 1 patch (0.1 mg total) onto the skin every Friday. 03/27/21   Vickie Epley, MD  diphenhydrAMINE (BENADRYL) 25 MG tablet Take 25 mg by mouth every 6 (six) hours as needed for allergies.    [provider]  doxazosin (CARDURA) 8 MG tablet Take 8 mg by mouth daily. 03/29/20   [provider]  ferric citrate (AURYXIA) 1 GM 210 MG(Fe) tablet Take 1 tablet by mouth 3 (three) times daily. 07/13/19   [provider]  fluticasone (FLONASE) 50 MCG/ACT nasal spray Place 2 sprays into both nostrils daily as needed for allergies or rhinitis.    [provider]  gentamicin cream (GARAMYCIN) 0.1 % Apply 1 application topically daily. 11/07/20   Fritzi Mandes, MD  hydrOXYzine (ATARAX) 10 MG tablet Take 10 mg by mouth 3 (three) times daily. 11/13/21   [provider]  losartan (COZAAR) 100 MG tablet Take 1 tablet (100 mg total) by mouth daily. 10/16/21   End, Harrell Gave, MD  multivitamin (RENA-VIT) TABS tablet Take 1 tablet by mouth at bedtime. 12/21/19   [provider]  sevelamer (RENAGEL) 800 MG tablet Take 800 mg by mouth 3 (three) times daily with meals.    [provider]  torsemide (DEMADEX) 100 MG tablet Take 100 mg by mouth daily.    [provider]   Physical Exam: Vitals:   01/17/22 1330 01/17/22 1400 01/17/22 1430 01/17/22 1600  BP: 137/87 135/86 (!) 142/90 (!) 140/99   Pulse: (!) 105 (!) 108 (!) 106 (!) 106  Resp: 20 (!) 28 (!) 25 20  Temp:    97.6 F (36.4 C)  TempSrc:    Oral  SpO2: 100% 100% 100% 100%  Weight:      Height:       Constitutional: appears age-appropriate, frail, chronically ill, NAD, calm Eyes: PERRL, lids and conjunctivae normal ENMT: Mucous membranes are moist. Posterior pharynx clear of any exudate or lesions. Age-appropriate dentition.  Lateral mild to moderate hearing loss Neck: normal, supple, no masses, no thyromegaly Respiratory: clear to auscultation bilaterally, no wheezing, no crackles. Normal respiratory effort. No accessory muscle use.  Cardiovascular: Regular rate and rhythm, no murmurs / rubs / gallops. No extremity edema. 2+ pedal pulses. No carotid bruits.  Abdomen: Peritoneal dialysis in the left lower quadrant abdomen present, no tenderness, no masses palpated, no hepatosplenomegaly. Bowel sounds positive.  Musculoskeletal: no clubbing / cyanosis. No joint deformity upper and lower extremities. Good ROM, no contractures, no atrophy. Normal muscle tone.  Skin: no rashes, lesions, ulcers. No induration Neurologic: Sensation intact. Strength 5/5 in all 4.  Psychiatric: Normal judgment and insight. Alert and oriented x 3. Depressed mood.   EKG: independently reviewed, showing atrial flutter with 2-1 AV conduction, rate of 104, QTc 524  Chest x-ray on Admission: I personally reviewed and I agree with radiologist reading as below.  DG Chest 2 View  Result Date: 01/17/2022 CLINICAL DATA:  Tachycardia. Shortness of breath. EXAM: CHEST - 2 VIEW COMPARISON:  Two-view chest x-ray 07/29/2021 FINDINGS: Heart is enlarged. A left pleural effusion is present. Asymmetric left basilar airspace opacity is present. Right lung is clear. No edema or effusion is present. Chronic pneumoperitoneum is again seen. IMPRESSION: 1. Left pleural effusion and associated airspace disease, likely atelectasis. Infection is not excluded. 2. Chronic  pneumoperitoneum. The patient is on hemodialysis and has a had a similar appearance of pneumoperitoneum in the past. Electronically Signed   By:  San Morelle M.D.   On: 01/17/2022 13:23    Labs on Admission: I have personally reviewed following labs  CBC: Recent Labs  Lab 01/17/22 1256  WBC 7.3  HGB 8.8*  HCT 28.0*  MCV 92.7  PLT 568*   Basic Metabolic Panel: Recent Labs  Lab 01/17/22 1256  NA 139  K 5.8*  CL 107  CO2 24  GLUCOSE 157*  BUN 55*  CREATININE 8.94*  CALCIUM 8.4*   GFR: Estimated Creatinine Clearance: 6.3 mL/min (A) (by C-G formula based on SCr of 8.94 mg/dL (H)).  Urine analysis:    Component Value Date/Time   COLORURINE STRAW (A) 11/28/2020 1055   APPEARANCEUR CLEAR (A) 11/28/2020 1055   APPEARANCEUR Clear 01/03/2012 1117   LABSPEC 1.008 11/28/2020 1055   LABSPEC 1.004 01/03/2012 1117   PHURINE 7.0 11/28/2020 1055   GLUCOSEU 50 (A) 11/28/2020 1055   GLUCOSEU 100 (A) 01/21/2020 1223   HGBUR NEGATIVE 11/28/2020 1055   BILIRUBINUR NEGATIVE 11/28/2020 1055   BILIRUBINUR neg 07/30/2013 1127   BILIRUBINUR Negative 01/03/2012 1117   KETONESUR NEGATIVE 11/28/2020 1055   PROTEINUR 30 (A) 11/28/2020 1055   UROBILINOGEN 0.2 01/21/2020 1223   NITRITE NEGATIVE 11/28/2020 1055   LEUKOCYTESUR NEGATIVE 11/28/2020 1055   LEUKOCYTESUR Negative 01/03/2012 1117   Dr. Tobie Poet [name]  If 7PM-7AM, please contact overnight-coverage provider If 7AM-7PM, please contact day coverage provider [email or web address]  01/17/2022, 4:30 PM

## 2022-01-17 NOTE — Hospital Course (Addendum)
Mr. Christiano Blandon is a 76 year old male with hyperlipidemia, hypertension, end-stage renal disease on peritoneal dialysis, cardiomyopathy, NYHA Class III HF, history of left renal cell carcinoma status post left heminephrectomy, history of CVA, paroxysmal atrial fib and atrial flutter, chronic pneumoperitoneum, who presents emergency department for chief concerns of palpitations and shortness of breath since Thursday, 01/14/2022.  Initial vitals in the emergency department showed temperature of 97.4, respiration rate of 28, heart rate of 108, blood pressure 135/86, SPO2 100% on room air.  Serum sodium is 139, potassium 5.8, chloride 107, bicarb 24, BUN of 55, serum creatinine of 8.94, GFR of 6, nonfasting blood glucose 157, WBC 7.3, hemoglobin 8.8, platelets of 145.  BNP was elevated at 3559.8. High sensitive troponin was 33.  EDP discussed case with [facility] cardiology on-call, Dr. Galen Manila, who recommends changing his bisoprolol to 5 mg p.o. twice daily from 5 mg daily.  ED treatment: Furosemide 80 mg IV one-time dose, bisoprolol 5 mg p.o. twice daily

## 2022-01-17 NOTE — Assessment & Plan Note (Signed)
-   Left renal cell carcinoma, status post left heminephrectomy

## 2022-01-17 NOTE — Assessment & Plan Note (Signed)
-   Nephrology specialist has been consulted, Dr. Theador Hawthorne via secure chat

## 2022-01-17 NOTE — Assessment & Plan Note (Addendum)
-   Eliquis 5 mg p.o. twice daily and amiodarone 200 mg daily resumed - Bisoprolol 5 mg p.o. twice daily initiated by EDP at the recommendation of [facility] cardiologist, Dr. Garen Lah - Doxazosin 8 mg daily resumed

## 2022-01-17 NOTE — Assessment & Plan Note (Deleted)
-   Amiodarone 200 mg daily resumed

## 2022-01-17 NOTE — ED Provider Triage Note (Signed)
Emergency Medicine Provider Triage Evaluation Note  [name], a 76 y.o. male  was evaluated in triage.  Pt complains of palpitations since Thursday.  Reports no significant benefit to the medications he takes at this time or he also notes some shortness of breath.  Review of Systems  Positive: Heart palpitations, SOB Negative: FCS  Physical Exam  Ht '5\' 6"'$  (1.676 m)   Wt 70 kg   BMI 24.91 kg/m  Gen:   Awake, no distress NAD Resp:  Normal effort CTA MSK:   Moves extremities without difficulty  Other:  Tachy rate  Medical Decision Making  Medically screening exam initiated at 12:54 PM.  Appropriate orders placed.  [name] was informed that the remainder of the evaluation will be completed by another provider, this initial triage assessment does not replace that evaluation, and the importance of remaining in the ED until their evaluation is complete.  Geriatric patient to the ED for evaluation of heart palpitations and shortness of breath.   Melvenia Needles, PA-C 01/18/22 2013

## 2022-01-17 NOTE — Assessment & Plan Note (Signed)
-   Did not resolve with discontinuation of amiodarone in May 2023 - I recommended patient to discuss with cardiologist outpatient on resumption of amiodarone for heart rhythm control

## 2022-01-17 NOTE — Assessment & Plan Note (Addendum)
-   Patient is not ready to quit - Nicotine patch as needed ordered for nicotine craving

## 2022-01-17 NOTE — Assessment & Plan Note (Signed)
-   Query new machine malfunctioning versus incorrect adjustments - Nephrology has been consulted for additional dialysis - Recommend patient contact dialysis machine prior to discharge to ensure appropriate functioning - Strict I's and O's - Admit to telemetry cardiac, observation

## 2022-01-17 NOTE — ED Provider Notes (Signed)
[facility] Provider Note    Event Date/Time   First MD Initiated Contact with Patient 01/17/22 1310     (approximate)   History   Tachycardia   HPI  [name] is a 75 y.o. 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  He had some shortness of breath initially but this improved after he did dialysis.  He denies any cough or fever.  He has no leg swelling.  He denies any chest pain.  He states he took an extra dose of amiodarone today with no improvement in the palpitations.    Physical Exam   Triage Vital Signs: ED Triage Vitals  Enc Vitals Group     BP 01/17/22 1321 132/86     Pulse Rate 01/17/22 1321 (!) 107     Resp 01/17/22 1321 (!) 25     Temp 01/17/22 1321 (!) 97.4 F (36.3 C)     Temp Source 01/17/22 1321 Oral     SpO2 01/17/22 1321 99 %     Weight 01/17/22 1247 154 lb 5.2 oz (70 kg)     Height 01/17/22 1247 '5\' 6"'$  (1.676 m)     Head Circumference --      Peak Flow --      Pain Score 01/17/22 1247 0     Pain Loc --      Pain Edu? --      Excl. in [place]? --     Most recent vital signs: Vitals:   01/17/22 1400 01/17/22 1430  BP: 135/86 (!) 142/90  Pulse: (!) 108 (!) 106  Resp: (!) 28 (!) 25  Temp:    SpO2: 100% 100%     General: Alert and oriented, relatively well-appearing. CV:  Tachycardic, normal heart sounds.  Good peripheral perfusion.  Resp:  Normal effort.  Lungs CTAB. Abd:  No distention.  Other:  No peripheral edema.   ED Results / Procedures / Treatments   Labs (all labs ordered are listed, but only abnormal results are displayed) Labs Reviewed  BASIC  METABOLIC PANEL - Abnormal; Notable for the following components:      Result Value   Potassium 5.8 (*)    Glucose, Bld 157 (*)    BUN 55 (*)    Creatinine, Ser 8.94 (*)    Calcium 8.4 (*)    GFR, Estimated 6 (*)    All other components within normal limits  CBC - Abnormal; Notable for the following components:   RBC 3.02 (*)    Hemoglobin 8.8 (*)    HCT 28.0 (*)    Platelets 145 (*)    All other components within normal limits  BRAIN NATRIURETIC PEPTIDE - Abnormal; Notable for the following components:   B Natriuretic Peptide 3,559.8 (*)    All other components within normal limits  TROPONIN I (HIGH SENSITIVITY) - Abnormal; Notable for the following components:   Troponin I (High Sensitivity) 33 (*)    All other components within normal limits  TROPONIN I (HIGH SENSITIVITY)     EKG  ED ECG REPORT I, Arta Silence, the attending physician, personally viewed and interpreted this ECG.  Date: 01/17/2022 EKG Time: 1333 Rate: 104 Rhythm:  Atrial flutter QRS Axis: normal Intervals: IVCD ST/T Wave abnormalities: Nonspecific abnormalities Narrative Interpretation: Atrial flutter with 2:1 AV block and no evidence of acute ischemia    RADIOLOGY  Chest x-ray: I independently viewed and interpreted the images; there is a left pleural effusion with no focal consolidation or edema   PROCEDURES:  Critical Care performed: No  Procedures   MEDICATIONS ORDERED IN ED: Medications  bisoprolol (ZEBETA) tablet 5 mg (5 mg Oral Given 01/17/22 1545)  acetaminophen (TYLENOL) tablet 650 mg (has no administration in time range)    Or  acetaminophen (TYLENOL) suppository 650 mg (has no administration in time range)  ondansetron (ZOFRAN) tablet 4 mg (has no administration in time range)    Or  ondansetron (ZOFRAN) injection 4 mg (has no administration in time range)  senna-docusate (Senokot-S) tablet 1 tablet (has no administration in time range)  furosemide (LASIX) injection 80  mg (80 mg Intravenous Given 01/17/22 1524)     IMPRESSION / MDM / ASSESSMENT AND PLAN / ED COURSE  I reviewed the triage vital signs and the nursing notes.  76 year old male with PMH as noted above presents with palpitations over the last several days initially associated with some shortness of breath.  I reviewed the past medical records.  The patient was most recently evaluated by his cardiologist Dr. Saunders Revel on 10/16/2021 and was receiving losartan and amiodarone at that time.  Differential diagnosis includes, but is not limited to, atrial fibrillation/flutter, ACS, sinus tachycardia due to other etiologies such as dehydration or metabolic abnormality, acute infection/sepsis.  Initial EKG had a poor quality baseline and showed questionable anterior ST elevations not meeting STEMI criteria.  Repeat EKG does not show these findings and ST morphology is similar to prior although the patient is in atrial flutter.  Patient's presentation is most consistent with acute presentation with potential threat to life or bodily function.  We will obtain chest x-ray, lab work-up, and discussed with cardiology in terms of IV rate control or other acute treatment.  The patient is on the cardiac monitor to evaluate for evidence of arrhythmia and/or significant heart rate changes.  ----------------------------------------- 3:52 PM on 01/17/2022 -----------------------------------------  Chest x-ray shows no significant edema although the patient does have a pleural effusion.  Troponin is minimally elevated but the BNP is significantly elevated.  I consulted Dr. Garen Lah from cardiology and discussed the case with him.  He recommends starting the patient on bisoprolol twice daily and on diuresing him.  He recommends nephrology consult for additional recommendations.  I then consulted Dr. Tobie Poet from the hospitalist service; based on her discussion she agrees to admit the patient.   FINAL CLINICAL  IMPRESSION(S) / ED DIAGNOSES   Final diagnoses:  Atrial flutter, unspecified type ([place])  Hypervolemia, unspecified hypervolemia type     Rx / DC Orders   ED Discharge Orders     None        Note:  This document was prepared using Dragon voice recognition software and may include unintentional dictation errors.    Arta Silence, MD 01/17/22 [phone]

## 2022-01-17 NOTE — Progress Notes (Signed)
[facility]  ROUNDING NOTE   Subjective:   [name] -Patient is a 76 year old Caucasian male with a past medical history of chronic combined systolic and diastolic CHF, hypertension, coronary artery disease, peripheral artery disease, sleep apnea, renal cell carcinoma, s/p left heminephrectomy, CVA, paroxysmal atrial fibrillation and atrial flutter, chronic pneumoperitoneum who came to the ER with chief complaint of palpitations and shortness of breath History of present illness dates back to a week ago when patient started having development of shortness of breath it was getting progressively worse.  Patient is currently being admitted for Volume overload [E87.70] Hypervolemia, unspecified hypervolemia type [E87.70] Atrial flutter, unspecified type [facility]) [I48.92]    Nephrology was consulted for comanagement of dialysis patient. Patient main concern continues to be shortness of breath.   Patient correlates this to his new peritoneal dialysis machine which he received a year ago/a week ago.   Patient informed me that his dialysis machine has been seeing low drain for the past week or so.   Patient did not also inform me that he did a manual exchange during the day as his cycler was not working at night.   Patient informed me that d he did 3 manual exchanges/treatments with 2.5% with around an hour of dwell time but he continued to feel shortness of breath and just recently came to the ER. Patient does complain of cough which has been chronic and he has had that for past 2 or more years Patient does not give any history of chest pain Patient does not give any history of abdominal pain No complaint of cloudiness in his peritoneal fluid    Objective:  Vital signs in last 24 hours:  Temp:  [97.4 F (36.3 C)-97.6 F (36.4 C)] 97.6 F (36.4 C) (08/27 1718) Pulse Rate:  [105-108] 106 (08/27 1718) Resp:  [20-28] 20 (08/27 1718) BP: (132-142)/(86-99) 138/93 (08/27  1718) SpO2:  [99 %-100 %] 100 % (08/27 1718) Weight:  [70 kg] 70 kg (08/27 1247)  Weight change:  Filed Weights   01/17/22 1247  Weight: 70 kg    Intake/Output: No intake/output data recorded.   Intake/Output this shift:  No intake/output data recorded.  Physical Exam: General: NAD, sitting up in bed  Head: Normocephalic, atraumatic. Moist oral mucosal membranes  Eyes: Anicteric  Lungs:  Clear to auscultation, normal effort  Heart: Regular rate and rhythm  Abdomen:  Soft, nontender, nondistended  Extremities: trace peripheral edema.  Neurologic: Nonfocal, moving all four extremities  Skin: No lesions  Access: PD catheter    Basic Metabolic Panel: Recent Labs  Lab 01/17/22 1256  NA 139  K 5.8*  CL 107  CO2 24  GLUCOSE 157*  BUN 55*  CREATININE 8.94*  CALCIUM 8.4*    Liver Function Tests: No results for input(s): "AST", "ALT", "ALKPHOS", "BILITOT", "PROT", "ALBUMIN" in the last 168 hours. No results for input(s): "LIPASE", "AMYLASE" in the last 168 hours. No results for input(s): "AMMONIA" in the last 168 hours.  CBC: Recent Labs  Lab 01/17/22 1256  WBC 7.3  HGB 8.8*  HCT 28.0*  MCV 92.7  PLT 145*    Cardiac Enzymes: No results for input(s): "CKTOTAL", "CKMB", "CKMBINDEX", "TROPONINI" in the last 168 hours.  BNP: Invalid input(s): "POCBNP"  CBG: No results for input(s): "GLUCAP" in the last 168 hours.   Microbiology: Results for orders placed or performed during the hospital encounter of 04/01/21  Resp Panel by RT-PCR (Flu A&B, Covid) Nasopharyngeal Swab  Status: None   Collection Time: 04/01/21 11:28 AM   Specimen: Nasopharyngeal Swab; Nasopharyngeal(NP) swabs in vial transport medium  Result Value Ref Range Status   SARS Coronavirus 2 by RT PCR NEGATIVE NEGATIVE Final    Comment: (NOTE) SARS-CoV-2 target nucleic acids are NOT DETECTED.  The SARS-CoV-2 RNA is generally detectable in upper respiratory specimens during the acute phase of  infection. The lowest concentration of SARS-CoV-2 viral copies this assay can detect is 138 copies/mL. A negative result does not preclude SARS-Cov-2 infection and should not be used as the sole basis for treatment or other patient management decisions. A negative result may occur with  improper specimen collection/handling, submission of specimen other than nasopharyngeal swab, presence of viral mutation(s) within the areas targeted by this assay, and inadequate number of viral copies(<138 copies/mL). A negative result must be combined with clinical observations, patient history, and epidemiological information. The expected result is Negative.  Fact Sheet for Patients:  EntrepreneurPulse.com.au  Fact Sheet for Healthcare Providers:  IncredibleEmployment.be  This test is no t yet approved or cleared by the [place] FDA and  has been authorized for detection and/or diagnosis of SARS-CoV-2 by FDA under an Emergency Use Authorization (EUA). This EUA will remain  in effect (meaning this test can be used) for the duration of the COVID-19 declaration under Section 564(b)(1) of the Act, 21 U.S.C.section 360bbb-3(b)(1), unless the authorization is terminated  or revoked sooner.       Influenza A by PCR NEGATIVE NEGATIVE Final   Influenza B by PCR NEGATIVE NEGATIVE Final    Comment: (NOTE) The Xpert Xpress SARS-CoV-2/FLU/RSV plus assay is intended as an aid in the diagnosis of influenza from Nasopharyngeal swab specimens and should not be used as a sole basis for treatment. Nasal washings and aspirates are unacceptable for Xpert Xpress SARS-CoV-2/FLU/RSV testing.  Fact Sheet for Patients: EntrepreneurPulse.com.au  Fact Sheet for Healthcare Providers: IncredibleEmployment.be  This test is not yet approved or cleared by the [place] FDA and has been authorized for detection and/or diagnosis of SARS-CoV-2  by FDA under an Emergency Use Authorization (EUA). This EUA will remain in effect (meaning this test can be used) for the duration of the COVID-19 declaration under Section 564(b)(1) of the Act, 21 U.S.C. section 360bbb-3(b)(1), unless the authorization is terminated or revoked.  Performed at [facility], [address] [place], [place] [address]     Coagulation Studies: No results for input(s): "LABPROT", "INR" in the last 72 hours.  Urinalysis: No results for input(s): "COLORURINE", "LABSPEC", "PHURINE", "GLUCOSEU", "HGBUR", "BILIRUBINUR", "KETONESUR", "PROTEINUR", "UROBILINOGEN", "NITRITE", "LEUKOCYTESUR" in the last 72 hours.  Invalid input(s): "APPERANCEUR"    Imaging: DG Chest 2 View  Result Date: 01/17/2022 CLINICAL DATA:  Tachycardia. Shortness of breath. EXAM: CHEST - 2 VIEW COMPARISON:  Two-view chest x-ray 07/29/2021 FINDINGS: Heart is enlarged. A left pleural effusion is present. Asymmetric left basilar airspace opacity is present. Right lung is clear. No edema or effusion is present. Chronic pneumoperitoneum is again seen. IMPRESSION: 1. Left pleural effusion and associated airspace disease, likely atelectasis. Infection is not excluded. 2. Chronic pneumoperitoneum. The patient is on hemodialysis and has a had a similar appearance of pneumoperitoneum in the past. Electronically Signed   By: San Morelle M.D.   On: 01/17/2022 13:23     Medications:      [START ON 01/18/2022] amiodarone  200 mg Oral Daily   apixaban  5 mg Oral BID   [START ON 01/18/2022] ascorbic acid  500  mg Oral Daily   [START ON 01/18/2022] B-complex with vitamin C  1 tablet Oral Daily   bisoprolol  5 mg Oral BID   [START ON 01/22/2022] cloNIDine  0.1 mg Transdermal Q Fri   [START ON 01/18/2022] doxazosin  8 mg Oral Daily   ferric citrate  210 mg Oral TID   hydrOXYzine  25 mg Oral TID   [START ON 01/18/2022] losartan  100 mg Oral Daily   multivitamin  1 tablet Oral QHS   sevelamer  carbonate  800 mg Oral TID WC   sodium zirconium cyclosilicate  10 g Oral Daily   acetaminophen **OR** acetaminophen, albuterol, diphenhydrAMINE, fluticasone, nicotine, ondansetron **OR** ondansetron (ZOFRAN) IV, senna-docusate  CXR  IMPRESSION: 1. Left pleural effusion and associated airspace disease, likely atelectasis. Infection is not excluded. 2. Chronic pneumoperitoneum. The patient is on hemodialysis and has a had a similar appearance of pneumoperitoneum in the past.      Assessment/ Plan:  [name] is a 76 y.o.  male with a past medical history of chronic combined systolic and diastolic heart failure with reported EF of 45 to 50%, hypertension, CAD, PAD, sleep apnea, renal cell carcinoma, and end-stage renal disease on peritoneal dialysis. Pateint admitted for Volume overload [E87.70] Hypervolemia, unspecified hypervolemia type [E87.70] Atrial flutter, unspecified type ([place]) [I48.92]  Patient is on CCPD with his treatment being  Total of 3 exchanges 2 L fill volume Total treatment of 6 hours Instill time of around 20 minutes Patient drain time of around 10 minutes     End stage renal disease with fluid overload on peritoneal dialysis.   The patient's PD regimen remains 3 fills, 2 L fill volume, and 6 hours of treatment.   Patient does have a history of nonadherence We will continue patient on his peritoneal dialysis treatment I educated patient about importance of PD treatment and need for adherence to the medical treatment/prescription  2.Anemia of chronic kidney disease Lab Results  Component Value Date   HGB 8.8 (L) 01/17/2022  He will continue with Epogen as an outpatient.    3. Secondary Hyperparathyroidism: with outpatient labs: PTH 1020, phosphorus 6.4, calcium 8.5 on 03/24/21.   Lab Results  Component Value Date   PTH 315 (H) 05/11/2017   CALCIUM 8.4 (L) 01/17/2022   PHOS 6.3 (H) 04/04/2021   Calcium and phosphorus not at goal.  Continue  calcitriol and Renvela.  4. Hypertension with chronic kidney disease.   Patient blood pressure on the higher side Resuming patient's home regimen should help We will follow   5Atrial fibrillation with flutter. Patient is on Eliquis and amiodarone Patient is being followed by the primary team and cardiologist   6.Hyperkalemia This is most likely secondary to nonadherence to his dialysis treatment Patient has been given diuretics We will start patient on [place] peritoneal dialysis should help as well   LOS: 0 Bridie Colquhoun s [facility] 8/27/20235:21 PM

## 2022-01-17 NOTE — Assessment & Plan Note (Addendum)
-   Baseline hemoglobin has been 10.9-11.9 over the past 4 months and is 8.8-11.9 over the last 9 months - Hemoglobin on admission is 8.8 - Negative for signs of bleeding

## 2022-01-17 NOTE — ED Triage Notes (Signed)
Pt reports his heart has been beating fast since Thursday. Pt reports he takes meds for it but it has not helped this time. Pt reports feels SOB

## 2022-01-18 ENCOUNTER — Observation Stay: Admit: 2022-01-18 | Payer: Medicare PPO

## 2022-01-18 DIAGNOSIS — E8779 Other fluid overload: Secondary | ICD-10-CM | POA: Diagnosis not present

## 2022-01-18 DIAGNOSIS — N184 Chronic kidney disease, stage 4 (severe): Secondary | ICD-10-CM | POA: Diagnosis not present

## 2022-01-18 DIAGNOSIS — I4891 Unspecified atrial fibrillation: Secondary | ICD-10-CM | POA: Diagnosis not present

## 2022-01-18 DIAGNOSIS — C642 Malignant neoplasm of left kidney, except renal pelvis: Secondary | ICD-10-CM | POA: Diagnosis not present

## 2022-01-18 DIAGNOSIS — I5022 Chronic systolic (congestive) heart failure: Secondary | ICD-10-CM

## 2022-01-18 DIAGNOSIS — I4892 Unspecified atrial flutter: Secondary | ICD-10-CM | POA: Diagnosis not present

## 2022-01-18 DIAGNOSIS — I480 Paroxysmal atrial fibrillation: Secondary | ICD-10-CM

## 2022-01-18 LAB — CBC
HCT: 26.8 % — ABNORMAL LOW (ref 39.0–52.0)
Hemoglobin: 8.5 g/dL — ABNORMAL LOW (ref 13.0–17.0)
MCH: 29.2 pg (ref 26.0–34.0)
MCHC: 31.7 g/dL (ref 30.0–36.0)
MCV: 92.1 fL (ref 80.0–100.0)
Platelets: 148 10*3/uL — ABNORMAL LOW (ref 150–400)
RBC: 2.91 MIL/uL — ABNORMAL LOW (ref 4.22–5.81)
RDW: 13.9 % (ref 11.5–15.5)
WBC: 7.7 10*3/uL (ref 4.0–10.5)
nRBC: 0 % (ref 0.0–0.2)

## 2022-01-18 LAB — BASIC METABOLIC PANEL
Anion gap: 10 (ref 5–15)
BUN: 54 mg/dL — ABNORMAL HIGH (ref 8–23)
CO2: 23 mmol/L (ref 22–32)
Calcium: 8.3 mg/dL — ABNORMAL LOW (ref 8.9–10.3)
Chloride: 108 mmol/L (ref 98–111)
Creatinine, Ser: 8.45 mg/dL — ABNORMAL HIGH (ref 0.61–1.24)
GFR, Estimated: 6 mL/min — ABNORMAL LOW (ref >60)
Glucose, Bld: 87 mg/dL (ref 70–99)
Potassium: 4.9 mmol/L (ref 3.5–5.1)
Sodium: 141 mmol/L (ref 135–145)

## 2022-01-18 LAB — HEPATITIS B CORE ANTIBODY, TOTAL: Hep B Core Total Ab: NONREACTIVE

## 2022-01-18 LAB — HEPATITIS B SURFACE ANTIGEN: Hepatitis B Surface Ag: NONREACTIVE

## 2022-01-18 LAB — HEPATITIS B CORE ANTIBODY, IGM: Hep B C IgM: NONREACTIVE

## 2022-01-18 MED ORDER — BISOPROLOL FUMARATE 5 MG PO TABS: 5.0000 mg | ORAL_TABLET | Freq: Two times a day (BID) | ORAL | 0 refills | Status: DC

## 2022-01-18 NOTE — Consult Note (Signed)
Cardiology Consultation   Patient ID: [name] MRN: [id number]; DOB: 08-07-1945  Admit date: 01/17/2022 Date of Consult: 01/18/2022  PCP:  Crecencio Mc, MD   [place] Providers Cardiologist:  [name], MD  Electrophysiologist:  Vickie Epley, MD       Patient Profile:   [name] is a 76 y.o. male with a hx of end-stage renal disease currently on peritoneal dialysis, cardiomegaly with an EF of 40 to 45%, paroxysmal atrial fibrillation/atrial flutter, essential hypertension, hyperlipidemia, peripheral arterial disease with chronic dissection of the left subclavian artery followed by vascular surgery, sleep apnea, renal cell carcinoma status post left heminephrectomy, tobacco abuse, CVA, and adenocarcinoma of the appendix, who is being seen 01/18/2022 for the evaluation of shortness of breath and atrial fibrillation/atrial flutter at the request of Dr. Tobie Poet.  History of Present Illness:   [name] is a 76 year old male with a complex medical history including end-stage renal disease on peritoneal dialysis, cardio myopathy with an EF of 40 to 45%, essential hypertension, hyperlipidemia, peripheral arterial disease, sleep apnea, renal cell carcinoma status post left heminephrectomy, and adenocarcinoma of the appendix.  His cardiac history has been significant for short dissection of the proximal left subclavian artery which was determined to be nonsurgical per vascular in August 2021 when he presented to the hospital with lack of coordination and multiple small infarcts involving the left cerebellar hemisphere.  Subsequent event monitoring did not show atrial fibrillation.  In follow-up he was started on isosorbide mononitrate for blood pressure control and antianginal effect for worsening DOE but was stopped due to significant headaches.  His echocardiogram revealed an additional EF of 40 to 45%, global hypokinesis, grade 1 diastolic dysfunction, normal LV  systolic function, severely dilated left atrium and mild mitral regurgitation.  05/12/2020 he was hospitalized for acute on chronic HFrEF and hypertensive urgency.  At that time there was question of paroxysmal atrial fibrillation when EMS first arrived on scene they cannot be confirmed by any tracings.  Decision was made to defer anticoagulation at that time.  Echocardiogram done on admission showed stable cardiomyopathy with EF of 4045% with no regional wall motion abnormalities, mildly dilated LV cavity size, moderate LVH, G1 DD, normal RV systolic function and ventricular cavity size, moderately dilated left atrium, mild mitral regurgitation, mild aortic insufficiency, mild to moderate aortic valve sclerosis without evidence of stenosis, dilated aortic root measuring 39 mm.  In March 2023 he presented to the [facility] emergency department with elevated heart rates and generalized fatigue.  EKG showed atrial flutter with variable conduction he underwent cardioversion in the emergency department with restoration of sinus rhythm.  Last seen in clinic in 11/26/2021 stating that he was doing well but and then the pruritus that he was having related to dialysis.  He had no medication changes that were made at that time.  He was having concerns with his peritoneal dialysis not draining as well as it previously had.  On 01/17/2022 he returned to the [facility] emergency department with some shortness of breath that initially improved after he had his dialysis.  He also had the sensation of his heart racing with an acute onset that he stated about 3 days ago and is pretty been persistent since that time.  He had a additionally taken an extra dose of his amiodarone with no improvement in his palpitations.  He denied any chest pain, peripheral edema, abdominal swelling, cough or fever.  He had noted concerns with issues  with his peritoneal dialysis that he was not dwelling and draining appropriately on the cycler.  He had  actually opted to do 3 manual exchanges without use of his cycler to see if that would help with the symptoms he was having at the time.  Unfortunately symptoms did slightly improve but he was still concerned with his elevated heart rate.  Initial vitals in the emergency department: Blood pressure 132/86, pulse 107, respirations of 25, temperature 97.4  Pertinent labs: Potassium 5.8, glucose 157, BUN 59, creatinine of 8.94, calcium 8.4, hemoglobin 8.8, HCT 28.0, platelets 145, BNP 3559.8, high-sensitivity troponin of 33  Medications given in the emergency department: Bisoprolol 5 mg and furosemide 80 mg IVP  Past Medical History:  Diagnosis Date   Acute on chronic systolic congestive heart failure (HCC)    Adenocarcinoma of appendix [facility]) Jan 2006   right kidney, s/p cryoablation   Atrial fibrillation with rapid ventricular response ([place]) 05/12/2020   Cardiomyopathy ([place])    a. 12/2018 Echo: EF 40-45%, global HK. Asc Ao 3.7cm; b. 01/2019 Lexi MV: small, mild, fixed basal and mid antlat defect - scar vs artifact. Small, mild mid and apical inf minimally reversible defect, likely scar w/ peri-infarct ischemia. Coronary and Ao atherosclerosis; c. 12/2019 Echo: EF 40-45%, glob HK, Gr1 DD. Nl RV fxn. Sev dil LA. Mild MR. Mild-mod Ao sclerosis w/o stenosis. Asc Ao 37m.   Carotid arterial disease ([place]    a. 01/2020 RICA 1-39%, RCCA/RECA < 571% LICA 10-62% LCCA/LECA <50%.   Claudication ([place]    a. 02/2020 ABI/TBI: R 1.08/0.95, L 1.00/0.70.   Complication of anesthesia    had to be woken up slowly as his bp was elevated when did this quickly   Diabetes mellitus without complication (HCC)    ESRD (end stage renal disease) ([place]    a. Peritoneal Dialysis pt.   Hyperlipidemia    Hypertension    Migraine    cluster   Neuromuscular disorder ([place]    left lower extrem neuropathy   Obstructive sleep apnea    no OSA since had facial surgery with dr. jKathyrn Sheriffin 1997   PAD (peripheral artery disease)  ([facility] Feb 2009   nonobstructing, renal angiogram ([name]   Renal cell carcinoma 2004   left kidney heminephrectomy   Renal insufficiency    Sepsis ([place] 11/06/2020   Stroke ([place]    a. 12/2019 MRI: Small acute infarcts involving the left cerebral hemisphere and several chronic infarcts.   TIA (transient ischemic attack)    no residual but left leg and foot still feel heavy   tobacco abuse     Past Surgical History:  Procedure Laterality Date   CAPD INSERTION N/A 03/01/2019   Procedure: LAPAROSCOPIC INSERTION CONTINUOUS AMBULATORY PERITONEAL DIALYSIS  (CAPD) CATHETER;  Surgeon: DAlgernon Huxley MD;  Location: [facility] ORS;  Service: Vascular;  Laterality: N/A;   CARDIAC CATHETERIZATION     Dr. AFletcher Anondid this to assess his renal artery   cyst removal  12/25/2015   Spine L4 and L5   heminephrectomy  2004   for renal cell CA   RENAL CRYOABLATION  Jan 2006   right kidney,  [name]   sciatica       Home Medications:  Prior to Admission medications   Medication Sig Start Date End Date Taking? Authorizing Provider  amiodarone (PACERONE) 200 MG tablet Take 1 tablet (200 mg total) by mouth daily. 11/26/21  Yes Laiza Veenstra, SBarbera Setters NP  apixaban (ELIQUIS) 5 MG TABS tablet Take  1 tablet (5 mg total) by mouth 2 (two) times daily. 03/25/21  Yes Vickie Epley, MD  Ascorbic Acid 500 MG CHEW Chew 1 tablet by mouth daily.   Yes [provider]  b complex vitamins capsule Take 1 capsule by mouth daily.   Yes [provider]  bisoprolol (ZEBETA) 5 MG tablet TAKE 1 TABLET BY MOUTH ONCE DAILY 12/09/21  Yes End, Harrell Gave, MD  doxazosin (CARDURA) 8 MG tablet Take 8 mg by mouth daily. 03/29/20  Yes [provider]  ferric citrate (AURYXIA) 1 GM 210 MG(Fe) tablet Take 1 tablet by mouth 3 (three) times daily. 07/13/19  Yes [provider]  hydrOXYzine (ATARAX) 25 MG tablet Take 25 mg by mouth 3 (three) times daily. 12/29/21  Yes [provider]  losartan (COZAAR) 100 MG tablet  Take 1 tablet (100 mg total) by mouth daily. 10/16/21  Yes End, Harrell Gave, MD  multivitamin (RENA-VIT) TABS tablet Take 1 tablet by mouth at bedtime. 12/21/19  Yes [provider]  sevelamer (RENAGEL) 800 MG tablet Take 800 mg by mouth 3 (three) times daily with meals.   Yes [provider]  torsemide (DEMADEX) 100 MG tablet Take 100 mg by mouth daily.   Yes [provider]  albuterol (VENTOLIN HFA) 108 (90 Base) MCG/ACT inhaler Inhale 2 puffs into the lungs every 6 (six) hours as needed for wheezing or shortness of breath. 03/25/21   Vickie Epley, MD  cloNIDine (CATAPRES - DOSED IN MG/24 HR) 0.1 mg/24hr patch Place 1 patch (0.1 mg total) onto the skin every Friday. 03/27/21   Vickie Epley, MD  diphenhydrAMINE (BENADRYL) 25 MG tablet Take 25 mg by mouth every 6 (six) hours as needed for allergies.    [provider]  fluticasone (FLONASE) 50 MCG/ACT nasal spray Place 2 sprays into both nostrils daily as needed for allergies or rhinitis.    [provider]  gentamicin cream (GARAMYCIN) 0.1 % Apply 1 application topically daily. 11/07/20   Fritzi Mandes, MD    Inpatient Medications: Scheduled Meds:  amiodarone  200 mg Oral Daily   apixaban  5 mg Oral BID   ascorbic acid  500 mg Oral Daily   B-complex with vitamin C  1 tablet Oral Daily   bisoprolol  5 mg Oral BID   [START ON 01/22/2022] cloNIDine  0.1 mg Transdermal Q Fri   doxazosin  8 mg Oral Daily   ferric citrate  210 mg Oral TID   gentamicin cream  1 Application Topical Daily   hydrOXYzine  25 mg Oral TID   losartan  100 mg Oral Daily   multivitamin  1 tablet Oral QHS   sevelamer carbonate  800 mg Oral TID WC   Continuous Infusions:  dialysis solution 2.5% low-MG/low-CA     PRN Meds: acetaminophen **OR** acetaminophen, albuterol, diphenhydrAMINE, fluticasone, nicotine, ondansetron **OR** ondansetron (ZOFRAN) IV, senna-docusate  Allergies:    Allergies  Allergen Reactions    Irbesartan Other (See Comments)    hyperkalemia   Cymbalta [Duloxetine Hcl]    Hydralazine Other (See Comments)    headache   Imdur [Isosorbide Nitrate] Other (See Comments)    headache   Rosuvastatin     Breast swelling/soreness   Atorvastatin Other (See Comments)    Muscle pain   Bystolic [Nebivolol Hcl]     Extreme fatigue     Social History:   Social History   Socioeconomic History   Marital status: Married    Spouse name:  [place]   Number of children: Not on file   Years of education: Not on file   Highest education level: Not on file  Occupational History   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  Tobacco Use   Smoking status: Every Day    Packs/day: 1.00    Years: 50.00    Total pack years: 50.00    Types: Cigarettes   Smokeless tobacco: Never  Vaping Use   Vaping Use: Former   Devices: tried but did not like  Substance and Sexual Activity   Alcohol use: Not Currently   Drug use: No   Sexual activity: Yes  Other Topics Concern   Not on file  Social History Narrative   Not on file   Social Determinants of Health   Financial Resource Strain: Low Risk  (04/21/2021)   Overall Financial Resource Strain (CARDIA)    Difficulty of Paying Living Expenses: Not hard at all  Food Insecurity: No Food Insecurity (04/21/2021)   Hunger Vital Sign    Worried About Running Out of Food in the Last Year: Never true    [place] in the Last Year: Never true  Transportation Needs: No Transportation Needs (04/21/2021)   PRAPARE - Hydrologist (Medical): No    Lack of Transportation (Non-Medical): No  Physical Activity: Insufficiently Active (08/15/2020)   Exercise Vital Sign    Days of Exercise per Week: 3 days    Minutes of Exercise per Session: 20 min  Stress: No Stress Concern Present (04/21/2021)   [place] of Stress : Not at all  Social  Connections: Unknown (04/21/2021)   Social Connection and Isolation Panel [NHANES]    Frequency of Communication with Friends and Family: Not on file    Frequency of Social Gatherings with Friends and Family: Not on file    Attends Religious Services: Not on file    Active Member of Clubs or Organizations: Not on file    Attends Archivist Meetings: Not on file    Marital Status: Married  Intimate Partner Violence: Not At Risk (04/21/2021)   Humiliation, Afraid, Rape, and Kick questionnaire    Fear of Current or Ex-Partner: No    Emotionally Abused: No    Physically Abused: No    Sexually Abused: No    Family History:    Family History  Problem Relation Age of Onset   Hypertension Mother    Cancer Mother        breast   Aneurysm Mother    Coronary artery disease Father    Hypertension Father    Stroke Father 26   Heart disease Father    Heart attack Father 71   Aneurysm Maternal Grandmother        brain   Aneurysm Paternal Grandmother        brain   Coronary artery disease Paternal Grandfather    Heart disease Brother        valvular heart disease   COPD Brother    Hypertension Brother    Stroke Paternal Uncle      ROS:  Please see the history of present illness.  Review of Systems  Constitutional:  Positive for malaise/fatigue.  HENT:  Positive for congestion.   Eyes: Negative.   Respiratory:  Positive for cough and shortness of breath.   Cardiovascular:  Positive for palpitations.  Gastrointestinal: Negative.   Genitourinary: Negative.  Musculoskeletal: Negative.   Skin:  Positive for itching.  Neurological: Negative.   Endo/Heme/Allergies: Negative.   Psychiatric/Behavioral: Negative.      All other ROS reviewed and negative.     Physical Exam/Data:   Vitals:   01/17/22 1930 01/18/22 0437 01/18/22 0500 01/18/22 0809  BP: (!) 149/93 (!) 179/73  (!) 173/73  Pulse: (!) 106 (!) 56  (!) 57  Resp: '20 20  14  '$ Temp: 98.4 F (36.9 C) 97.9 F  (36.6 C)  97.6 F (36.4 C)  TempSrc: Oral Oral  Oral  SpO2: 100% 97%  98%  Weight:   69.2 kg   Height:        Intake/Output Summary (Last 24 hours) at 01/18/2022 1022 Last data filed at 01/18/2022 1011 Gross per 24 hour  Intake 480 ml  Output 520 ml  Net -40 ml      01/18/2022    5:00 AM 01/17/2022   12:47 PM 11/26/2021    1:14 PM  Last 3 Weights  Weight (lbs) 152 lb 8.9 oz 154 lb 5.2 oz 153 lb  Weight (kg) 69.2 kg 70 kg 69.4 kg     Body mass index is 24.62 kg/m.  General:  Well nourished, well developed, in no acute distress HEENT: normal, glasses on Neck: no JVD Vascular: No carotid bruits; Distal pulses 2+ bilaterally Cardiac:  normal S1, S2; RRR; no murmur  Lungs:  clear to auscultation bilaterally, no wheezing, rhonchi or rales, respirations are unlabored on room air Abd: soft, nontender, no hepatomegaly  Ext: no edema Musculoskeletal:  No deformities, BUE and BLE strength normal and equal Skin: warm and dry  Neuro:  CNs 2-12 intact, no focal abnormalities noted Psych:  Normal affect   EKG:  The EKG was personally reviewed and demonstrates: Atrial flutter with a 2-1 block rate of 104 IVCD and left axis deviation Telemetry:  Telemetry was personally reviewed and demonstrates: Patient converted to sinus bradycardia at 00:54 this morning  Relevant CV Studies: Echocardiogram 01/27/2021  1. Left ventricular ejection fraction, by estimation, is 45 to 50%. The  left ventricle has mildly decreased function. The left ventricle  demonstrates global hypokinesis. The left ventricular internal cavity size  was mildly to moderately dilated. There  is moderate left ventricular hypertrophy. Left ventricular diastolic  parameters are consistent with Grade I diastolic dysfunction (impaired  relaxation). Elevated left atrial pressure. The average left ventricular  global longitudinal strain is -12.4 %.  The global longitudinal strain is abnormal.   2. Right ventricular systolic  function is normal. The right ventricular  size is normal. There is normal pulmonary artery systolic pressure.   3. Left atrial size was mild to moderately dilated.   4. The mitral valve is abnormal. Mild mitral valve regurgitation. No  evidence of mitral stenosis.   5. The aortic valve is tricuspid. There is mild calcification of the  aortic valve. There is mild thickening of the aortic valve. Aortic valve  regurgitation is mild. Mild to moderate aortic valve  sclerosis/calcification is present, without any evidence  of aortic stenosis.   6. There is mild dilatation of the ascending aorta, measuring 39 mm.   7. The inferior vena cava is normal in size with greater than 50%  respiratory variability, suggesting right atrial pressure of 3 mmHg.   Laboratory Data:  High Sensitivity Troponin:   Recent Labs  Lab 01/17/22 1256 01/17/22 1524  TROPONINIHS 33* 33*     Chemistry Recent Labs  Lab 01/17/22 1256 01/18/22  0528  NA 139 141  K 5.8* 4.9  CL 107 108  CO2 24 23  GLUCOSE 157* 87  BUN 55* 54*  CREATININE 8.94* 8.45*  CALCIUM 8.4* 8.3*  GFRNONAA 6* 6*  ANIONGAP 8 10    No results for input(s): "PROT", "ALBUMIN", "AST", "ALT", "ALKPHOS", "BILITOT" in the last 168 hours. Lipids No results for input(s): "CHOL", "TRIG", "HDL", "LABVLDL", "LDLCALC", "CHOLHDL" in the last 168 hours.  Hematology Recent Labs  Lab 01/17/22 1256 01/18/22 0528  WBC 7.3 7.7  RBC 3.02* 2.91*  HGB 8.8* 8.5*  HCT 28.0* 26.8*  MCV 92.7 92.1  MCH 29.1 29.2  MCHC 31.4 31.7  RDW 13.9 13.9  PLT 145* 148*   Thyroid No results for input(s): "TSH", "FREET4" in the last 168 hours.  BNP Recent Labs  Lab 01/17/22 1342  BNP 3,559.8*    DDimer No results for input(s): "DDIMER" in the last 168 hours.   Radiology/Studies:  DG Chest 2 View  Result Date: 01/17/2022 CLINICAL DATA:  Tachycardia. Shortness of breath. EXAM: CHEST - 2 VIEW COMPARISON:  Two-view chest x-ray 07/29/2021 FINDINGS: Heart is  enlarged. A left pleural effusion is present. Asymmetric left basilar airspace opacity is present. Right lung is clear. No edema or effusion is present. Chronic pneumoperitoneum is again seen. IMPRESSION: 1. Left pleural effusion and associated airspace disease, likely atelectasis. Infection is not excluded. 2. Chronic pneumoperitoneum. The patient is on hemodialysis and has a had a similar appearance of pneumoperitoneum in the past. Electronically Signed   By: San Morelle M.D.   On: 01/17/2022 13:23     Assessment and Plan:   Atrial fibrillation/atrial flutter on chronic anticoagulant of apixaban -Patient with symptoms of fluttering since Thursday and noted tachycardia on arrival to the emergency department was noted to be in atrial flutter even after taking additional dose of amiodarone at home -He converted back to sinus rhythm at almost 1 AM this morning and is remained in sinus bradycardia since that time -Continue amiodarone 200 mg daily -Continue apixaban 5 mg twice daily -Continue bisoprolol 5 mg twice daily of note this dose was increased yesterday evening  HFrEF -Last echocardiogram revealed LVEF 45-50% -BNP 3559 -Appears euvolemic on examination -shortness of breath has improved -repeat echocardiogram ordered and pending, last study was completed 01/2021 -He does peritoneal dialysis at home and there is questionable whether this cycler was having some malfunctioning component that led to his overload as well as a component of nonadherence -Daily weight, strict I&O, low-sodium diet  Hypertension -Blood pressure 173/73 prior to any medications being given this morning -Patient's blood pressure is typically difficult to manage, likely to improve once he has received his home medication regimen -Continue buspirone 5 mg twice daily -Continue doxazosin 8 mg daily -Continue clonidine patch 0.1 mg -Continue losartan 100 mg daily -Vital signs per unit protocol  End-stage renal  disease on peritoneal dialysis with chronic pruritus -Nephrology is following -He will continue peritoneal dialysis while in the hospital  Hyperkalemia -Potassium on arrival was 5.8 -Potassium this morning is 4.9 -Daily BMP -Monitor/trend/replete electrolytes as needed  Tobacco abuse -Recommend cessation unfortunately patient is not ready to quit smoking -Continue nicotine patch   Risk Assessment/Risk Scores:        New York Heart Association (NYHA) Functional Class NYHA Class II  CHA2DS2-VASc Score = 6   This indicates a 9.7% annual risk of stroke. The patient's score is based upon: CHF History: 1 HTN History: 1 Diabetes History: 0 Stroke History:  2 Vascular Disease History: 0 Age Score: 2 Gender Score: 0         For questions or updates, please contact [place] Please consult [email or web address] for contact info under    Signed, Naylee Frankowski, NP  01/18/2022 10:22 AM

## 2022-01-18 NOTE — Progress Notes (Signed)
[facility]  ROUNDING NOTE   Subjective:   [name] -Patient is a 76 year old Caucasian male with a past medical history of chronic combined systolic and diastolic CHF, hypertension, coronary artery disease, peripheral artery disease, sleep apnea, renal cell carcinoma, s/p left heminephrectomy, CVA, paroxysmal atrial fibrillation and atrial flutter, chronic pneumoperitoneum who came to the ER with chief complaint of palpitations and shortness of breath History of present illness dates back to a week ago when patient started having development of shortness of breath it was getting progressively worse.  Patient is currently being admitted for Volume overload [E87.70] Hypervolemia, unspecified hypervolemia type [E87.70] Atrial flutter, unspecified type [facility]) [I48.92]    Patient seen resting in bed, alert and oriented Patient states he feels much better today Patient states he was put completing treatments nightly as scheduled Utilizing green bags for treatment. Mild lower extremity edema Patient remains on room air    Objective:  Vital signs in last 24 hours:  Temp:  [97.6 F (36.4 C)-98.4 F (36.9 C)] 97.6 F (36.4 C) (08/28 0809) Pulse Rate:  [56-106] 57 (08/28 0809) Resp:  [14-20] 14 (08/28 0809) BP: (138-179)/(73-93) 173/73 (08/28 0809) SpO2:  [97 %-100 %] 98 % (08/28 0809) Weight:  [69.2 kg] 69.2 kg (08/28 0500)  Weight change:  Filed Weights   01/17/22 1247 01/18/22 0500  Weight: 70 kg 69.2 kg    Intake/Output: I/O last 3 completed shifts: In: 240 [P.O.:240] Out: 400 [Other:400]   Intake/Output this shift:  Total I/O In: 240 [P.O.:240] Out: 120 [Urine:120]  Physical Exam: General: NAD, sitting up in bed  Head: Normocephalic, atraumatic. Moist oral mucosal membranes  Eyes: Anicteric  Lungs:  Clear to auscultation, normal effort  Heart: Regular rate and rhythm  Abdomen:  Soft, nontender, nondistended  Extremities: trace peripheral edema.   Neurologic: Nonfocal, moving all four extremities  Skin: No lesions  Access: PD catheter    Basic Metabolic Panel: Recent Labs  Lab 01/17/22 1256 01/18/22 0528  NA 139 141  K 5.8* 4.9  CL 107 108  CO2 24 23  GLUCOSE 157* 87  BUN 55* 54*  CREATININE 8.94* 8.45*  CALCIUM 8.4* 8.3*     Liver Function Tests: No results for input(s): "AST", "ALT", "ALKPHOS", "BILITOT", "PROT", "ALBUMIN" in the last 168 hours. No results for input(s): "LIPASE", "AMYLASE" in the last 168 hours. No results for input(s): "AMMONIA" in the last 168 hours.  CBC: Recent Labs  Lab 01/17/22 1256 01/18/22 0528  WBC 7.3 7.7  HGB 8.8* 8.5*  HCT 28.0* 26.8*  MCV 92.7 92.1  PLT 145* 148*     Cardiac Enzymes: No results for input(s): "CKTOTAL", "CKMB", "CKMBINDEX", "TROPONINI" in the last 168 hours.  BNP: Invalid input(s): "POCBNP"  CBG: No results for input(s): "GLUCAP" in the last 168 hours.   Microbiology: Results for orders placed or performed during the hospital encounter of 04/01/21  Resp Panel by RT-PCR (Flu A&B, Covid) Nasopharyngeal Swab     Status: None   Collection Time: 04/01/21 11:28 AM   Specimen: Nasopharyngeal Swab; Nasopharyngeal(NP) swabs in vial transport medium  Result Value Ref Range Status   SARS Coronavirus 2 by RT PCR NEGATIVE NEGATIVE Final    Comment: (NOTE) SARS-CoV-2 target nucleic acids are NOT DETECTED.  The SARS-CoV-2 RNA is generally detectable in upper respiratory specimens during the acute phase of infection. The lowest concentration of SARS-CoV-2 viral copies this assay can detect is 138 copies/mL. A negative result does not preclude SARS-Cov-2 infection and  should not be used as the sole basis for treatment or other patient management decisions. A negative result may occur with  improper specimen collection/handling, submission of specimen other than nasopharyngeal swab, presence of viral mutation(s) within the areas targeted by this assay, and  inadequate number of viral copies(<138 copies/mL). A negative result must be combined with clinical observations, patient history, and epidemiological information. The expected result is Negative.  Fact Sheet for Patients:  EntrepreneurPulse.com.au  Fact Sheet for Healthcare Providers:  IncredibleEmployment.be  This test is no t yet approved or cleared by the [place] FDA and  has been authorized for detection and/or diagnosis of SARS-CoV-2 by FDA under an Emergency Use Authorization (EUA). This EUA will remain  in effect (meaning this test can be used) for the duration of the COVID-19 declaration under Section 564(b)(1) of the Act, 21 U.S.C.section 360bbb-3(b)(1), unless the authorization is terminated  or revoked sooner.       Influenza A by PCR NEGATIVE NEGATIVE Final   Influenza B by PCR NEGATIVE NEGATIVE Final    Comment: (NOTE) The Xpert Xpress SARS-CoV-2/FLU/RSV plus assay is intended as an aid in the diagnosis of influenza from Nasopharyngeal swab specimens and should not be used as a sole basis for treatment. Nasal washings and aspirates are unacceptable for Xpert Xpress SARS-CoV-2/FLU/RSV testing.  Fact Sheet for Patients: EntrepreneurPulse.com.au  Fact Sheet for Healthcare Providers: IncredibleEmployment.be  This test is not yet approved or cleared by the [place] FDA and has been authorized for detection and/or diagnosis of SARS-CoV-2 by FDA under an Emergency Use Authorization (EUA). This EUA will remain in effect (meaning this test can be used) for the duration of the COVID-19 declaration under Section 564(b)(1) of the Act, 21 U.S.C. section 360bbb-3(b)(1), unless the authorization is terminated or revoked.  Performed at [facility], [address] [place], [place] [id number]     Coagulation Studies: No results for input(s): "LABPROT", "INR" in the last 72  hours.  Urinalysis: No results for input(s): "COLORURINE", "LABSPEC", "PHURINE", "GLUCOSEU", "HGBUR", "BILIRUBINUR", "KETONESUR", "PROTEINUR", "UROBILINOGEN", "NITRITE", "LEUKOCYTESUR" in the last 72 hours.  Invalid input(s): "APPERANCEUR"    Imaging: DG Chest 2 View  Result Date: 01/17/2022 CLINICAL DATA:  Tachycardia. Shortness of breath. EXAM: CHEST - 2 VIEW COMPARISON:  Two-view chest x-ray 07/29/2021 FINDINGS: Heart is enlarged. A left pleural effusion is present. Asymmetric left basilar airspace opacity is present. Right lung is clear. No edema or effusion is present. Chronic pneumoperitoneum is again seen. IMPRESSION: 1. Left pleural effusion and associated airspace disease, likely atelectasis. Infection is not excluded. 2. Chronic pneumoperitoneum. The patient is on hemodialysis and has a had a similar appearance of pneumoperitoneum in the past. Electronically Signed   By: San Morelle M.D.   On: 01/17/2022 13:23     Medications:    dialysis solution 2.5% low-MG/low-CA       amiodarone  200 mg Oral Daily   apixaban  5 mg Oral BID   ascorbic acid  500 mg Oral Daily   B-complex with vitamin C  1 tablet Oral Daily   bisoprolol  5 mg Oral BID   [START ON 01/22/2022] cloNIDine  0.1 mg Transdermal Q Fri   doxazosin  8 mg Oral Daily   ferric citrate  210 mg Oral TID   gentamicin cream  1 Application Topical Daily   hydrOXYzine  25 mg Oral TID   losartan  100 mg Oral Daily   multivitamin  1 tablet Oral QHS   sevelamer  carbonate  800 mg Oral TID WC   acetaminophen **OR** acetaminophen, albuterol, diphenhydrAMINE, fluticasone, nicotine, ondansetron **OR** ondansetron (ZOFRAN) IV, senna-docusate  CXR  IMPRESSION: 1. Left pleural effusion and associated airspace disease, likely atelectasis. Infection is not excluded. 2. Chronic pneumoperitoneum. The patient is on hemodialysis and has a had a similar appearance of pneumoperitoneum in the past.      Assessment/ Plan:   [name] is a 76 y.o.  male with a past medical history of chronic combined systolic and diastolic heart failure with reported EF of 45 to 50%, hypertension, CAD, PAD, sleep apnea, renal cell carcinoma, and end-stage renal disease on peritoneal dialysis. Pateint admitted for Volume overload [E87.70] Hypervolemia, unspecified hypervolemia type [E87.70] Atrial flutter, unspecified type ([place]) [I48.92]  Patient is on CCPD with his treatment being  Total of 3 exchanges 2 L fill volume Total treatment of 6 hours Instill time of around 20 minutes Patient drain time of around 10 minutes     End stage renal disease with fluid overload on peritoneal dialysis.   Patient received nightly dialysis with UF 400 mL achieved.  Patient currently expresses relief from symptoms.  Patient cleared to discharge from renal stance to follow-up with PD clinic.  Renal navigator confirmed patient follow-up with PD clinic today or tomorrow and has notified patient.  2.Anemia of chronic kidney disease Lab Results  Component Value Date   HGB 8.5 (L) 01/18/2022  Continue with Epogen as an outpatient.    3. Secondary Hyperparathyroidism: with outpatient labs: PTH 1020, phosphorus 6.4, calcium 8.5 on 03/24/21.   Lab Results  Component Value Date   PTH 315 (H) 05/11/2017   CALCIUM 8.3 (L) 01/18/2022   PHOS 6.3 (H) 04/04/2021  Calcium within acceptable range however phosphorus remains elevated. Continue calcitriol and Renvela.  4. Hypertension with chronic kidney disease.   Blood pressure remains elevated, 173/73.  Home medications resumed.    6.Hyperkalemia This is most likely secondary to nonadherence to his dialysis treatment Potassium corrected with dialysis and Lokelma.   LOS: 0 Ladonya Jerkins 8/28/20234:49 PM

## 2022-01-18 NOTE — Discharge Summary (Addendum)
Physician Discharge Summary   Patient: [name] MRN: [id number]  DOB: 23-Feb-1946   Admit:     Date of Admission: 01/17/2022 Admitted from: home   Discharge: Date of discharge: 01/18/22 Disposition: Home Condition at discharge: good  CODE STATUS: FULL     Discharge Physician: Emeterio Reeve, DO [name]     PCP: Crecencio Mc, MD  Recommendations for Outpatient Follow-up:  Follow up with PCP Crecencio Mc, MD as scheduled Follow up with nephrology / peritoneal dialysis clinic as directed by nephrology team this week  Follow-up with cardiology for echocardiogram   Discharge Instructions     Diet - low sodium heart healthy   Complete by: As directed    Increase activity slowly   Complete by: As directed    No wound care   Complete by: As directed           Hospital Course: [name] is a 76 year old male with hyperlipidemia, hypertension, end-stage renal disease on peritoneal dialysis, cardiomyopathy, NYHA Class III HF, history of left renal cell carcinoma status post left heminephrectomy, history of CVA, paroxysmal atrial fib and atrial flutter, chronic pneumoperitoneum, who presents emergency department Sunday 01/17/22 for chief concerns of palpitations and shortness of breath since Thursday, 01/14/2022.  Patient had concerns that peritoneal dialysis machine may not be functioning appropriately. Of note: 01/27/2021 Complete echo read as ejection fraction 45 to 50%, left ventricular demonstrates global hypokinesis.  Grade 1 diastolic dysfunction.  There is moderate left ventricular hypertrophy. 08/27: temperature of 97.4, respiration rate of 28, heart rate of 108, blood pressure 135/86, SPO2 100% on room air. Serum sodium is 139, potassium 5.8, chloride 107, bicarb 24, BUN of 55, serum creatinine of 8.94, GFR of 6, nonfasting blood glucose 157, WBC 7.3, hemoglobin 8.8, platelets of 145. BNP was elevated at 3559.8. High sensitive troponin  was 33. EDP discussed case with [facility] cardiology on-call, Dr. Galen Manila, who recommends changing his bisoprolol to 5 mg p.o. twice daily from 5 mg daily. ED treatment: Furosemide 80 mg IV one-time dose for hyperkalemia, bisoprolol 5 mg p.o. twice daily.  Admitted to hospitalist service, observation status, concern for volume overload.  Nephrology consulted for additional dialysis -continue on peritoneal dialysis treatment.  Noted history of nonadherence. 08/28: Atrial fibs/flutter, converted back to sinus rhythm overnight, has remained sinus bradycardia.  Cardiology saw him this morning, recommended continuing amiodarone 200 mg daily, apixaban 5 mg twice daily, bisoprolol 5 mg twice daily.  Appears euvolemic on exam, improved SOB, repeat echocardiogram pending.  Per nephrology team, patient should be able to go by PT clinic today for them to evaluate his equipment.  Plan for discharge this afternoon after dialysis coordinator has spoken with him.   Consultants:  Nephrology Cardiology  Procedures:  Peritoneal dialysis       Discharge Diagnoses: Principal Problem:   Volume overload Active Problems:   Hyperlipidemia   History of renal cell carcinoma   tobacco abuse   Hyperkalemia   Adenocarcinoma, renal cell (HCC)   Anemia of chronic kidney failure, stage 4 (severe) (HCC)   Renal cell carcinoma (HCC)   HFrEF (heart failure with reduced ejection fraction) (HCC)   PAF (paroxysmal atrial fibrillation) (HCC)   ESRD on peritoneal dialysis ([place])   Resistant hypertension   Diabetes mellitus with ESRD (end-stage renal disease) (HCC)   Atrial fibrillation and flutter (HCC)   Pruritus    Assessment & Plan:  ESRD on peritoneal dialysis ([place]) Volume overload question  malfunction home equipment - volume status improved  - Nephrology following - Query new machine malfunctioning versus incorrect adjustments - dialysis coordinator will arrange  - Strict I's and O's - Admit to telemetry  cardiac, observation --> discharge 01/18/22   Renal cell carcinoma (HCC) - Left renal cell carcinoma, status post left heminephrectomy  Anemia of chronic kidney failure, stage 4 (severe) (HCC) - Baseline hemoglobin has been 10.9-11.9 over the past 4 months and is 8.8-11.9 over the last 9 months - Hemoglobin on admission is 8.8 - Negative for signs of bleeding   Atrial fibrillation and flutter ([place]) - resolved back to SR - Eliquis 5 mg p.o. twice daily and amiodarone 200 mg daily resumed - Bisoprolol 5 mg p.o. twice daily initiated by EDP at the recommendation of [facility] cardiologist, Dr. Garen Lah - Doxazosin 8 mg daily resumed  tobacco abuse - Patient is not ready to quit - Nicotine patch as needed ordered for nicotine craving  Pruritus - Did not resolve with discontinuation of amiodarone in May 2023 - I recommended patient to discuss with cardiologist outpatient on resumption of amiodarone for heart rhythm control  Resistant hypertension - increased bisoprolol 5 mg p.o. to twice daily, continued doxazosin 8 mg daily, losartan 100 mg daily, clonidine 0.1 mg transdermal every 7 days, first dose 01/22/2022  Hyperkalemia - improved  - Status post furosemide 80 mg IV per EDP - Additional correction via dialysis - BMP in the a.m.      Discharge Instructions  Allergies as of 01/18/2022       Reactions   Irbesartan Other (See Comments)   hyperkalemia   Cymbalta [duloxetine Hcl]    Hydralazine Other (See Comments)   headache   Imdur [isosorbide Nitrate] Other (See Comments)   headache   Rosuvastatin    Breast swelling/soreness   Atorvastatin Other (See Comments)   Muscle pain   Bystolic [nebivolol Hcl]    Extreme fatigue         Medication List     STOP taking these medications    diphenhydrAMINE 25 MG tablet Commonly known as: BENADRYL       TAKE these medications    albuterol 108 (90 Base) MCG/ACT inhaler Commonly known as: VENTOLIN HFA Inhale 2 puffs  into the lungs every 6 (six) hours as needed for wheezing or shortness of breath.   amiodarone 200 MG tablet Commonly known as: PACERONE Take 1 tablet (200 mg total) by mouth daily.   apixaban 5 MG Tabs tablet Commonly known as: Eliquis Take 1 tablet (5 mg total) by mouth 2 (two) times daily.   Ascorbic Acid 500 MG Chew Chew 1 tablet by mouth daily.   b complex vitamins capsule Take 1 capsule by mouth daily.   bisoprolol 5 MG tablet Commonly known as: ZEBETA Take 1 tablet (5 mg total) by mouth 2 (two) times daily. What changed: when to take this   cloNIDine 0.1 mg/24hr patch Commonly known as: CATAPRES - Dosed in mg/24 hr Place 1 patch (0.1 mg total) onto the skin every Friday.   doxazosin 8 MG tablet Commonly known as: CARDURA Take 8 mg by mouth daily.   ferric citrate 1 GM 210 MG(Fe) tablet Commonly known as: AURYXIA Take 1 tablet by mouth 3 (three) times daily.   fluticasone 50 MCG/ACT nasal spray Commonly known as: FLONASE Place 2 sprays into both nostrils daily as needed for allergies or rhinitis.   gentamicin cream 0.1 % Commonly known as: GARAMYCIN Apply 1 application topically  daily.   hydrOXYzine 25 MG tablet Commonly known as: ATARAX Take 25 mg by mouth 3 (three) times daily.   losartan 100 MG tablet Commonly known as: COZAAR Take 1 tablet (100 mg total) by mouth daily.   multivitamin Tabs tablet Take 1 tablet by mouth at bedtime.   sevelamer 800 MG tablet Commonly known as: RENAGEL Take 800 mg by mouth 3 (three) times daily with meals.   torsemide 100 MG tablet Commonly known as: DEMADEX Take 100 mg by mouth daily.          Allergies  Allergen Reactions   Irbesartan Other (See Comments)    hyperkalemia   Cymbalta [Duloxetine Hcl]    Hydralazine Other (See Comments)    headache   Imdur [Isosorbide Nitrate] Other (See Comments)    headache   Rosuvastatin     Breast swelling/soreness   Atorvastatin Other (See Comments)    Muscle  pain   Bystolic [Nebivolol Hcl]     Extreme fatigue      Subjective: Patient feeling well this morning, no concerns for chest pain/shortness of breath, feels better compared to presentation to the ED, though is fatigued.  No objections or concerns for discharge later than needing dialysis equipment checked   Discharge Exam: BP (!) 173/73 (BP Location: Left Arm)   Pulse (!) 57   Temp 97.6 F (36.4 C) (Oral)   Resp 14   Ht '5\' 6"'$  (1.676 m)   Wt 69.2 kg   SpO2 98%   BMI 24.62 kg/m  General: Pt is alert, awake, not in acute distress Cardiovascular: RRR, S1/S2 +, no rubs, no gallops Respiratory: CTA bilaterally, no wheezing, no rhonchi Abdominal: Soft, NT, ND, bowel sounds + Extremities: no edema, no cyanosis     The results of significant diagnostics from this hospitalization (including imaging, microbiology, ancillary and laboratory) are listed below for reference.     Microbiology: No results found for this or any previous visit (from the past 240 hour(s)).   Labs: BNP (last 3 results) Recent Labs    01/17/22 1342  BNP 6,387.5*   Basic Metabolic Panel: Recent Labs  Lab 01/17/22 1256 01/18/22 0528  NA 139 141  K 5.8* 4.9  CL 107 108  CO2 24 23  GLUCOSE 157* 87  BUN 55* 54*  CREATININE 8.94* 8.45*  CALCIUM 8.4* 8.3*   Liver Function Tests: No results for input(s): "AST", "ALT", "ALKPHOS", "BILITOT", "PROT", "ALBUMIN" in the last 168 hours. No results for input(s): "LIPASE", "AMYLASE" in the last 168 hours. No results for input(s): "AMMONIA" in the last 168 hours. CBC: Recent Labs  Lab 01/17/22 1256 01/18/22 0528  WBC 7.3 7.7  HGB 8.8* 8.5*  HCT 28.0* 26.8*  MCV 92.7 92.1  PLT 145* 148*   Cardiac Enzymes: No results for input(s): "CKTOTAL", "CKMB", "CKMBINDEX", "TROPONINI" in the last 168 hours. BNP: Invalid input(s): "POCBNP" CBG: No results for input(s): "GLUCAP" in the last 168 hours. D-Dimer No results for input(s): "DDIMER" in the last 72  hours. Hgb A1c No results for input(s): "HGBA1C" in the last 72 hours. Lipid Profile No results for input(s): "CHOL", "HDL", "LDLCALC", "TRIG", "CHOLHDL", "LDLDIRECT" in the last 72 hours. Thyroid function studies No results for input(s): "TSH", "T4TOTAL", "T3FREE", "THYROIDAB" in the last 72 hours.  Invalid input(s): "FREET3" Anemia work up No results for input(s): "VITAMINB12", "FOLATE", "FERRITIN", "TIBC", "IRON", "RETICCTPCT" in the last 72 hours. Urinalysis    Component Value Date/Time   COLORURINE STRAW (A) 11/28/2020 1055  APPEARANCEUR CLEAR (A) 11/28/2020 1055   APPEARANCEUR Clear 01/03/2012 1117   LABSPEC 1.008 11/28/2020 1055   LABSPEC 1.004 01/03/2012 1117   PHURINE 7.0 11/28/2020 1055   GLUCOSEU 50 (A) 11/28/2020 1055   GLUCOSEU 100 (A) 01/21/2020 1223   HGBUR NEGATIVE 11/28/2020 1055   BILIRUBINUR NEGATIVE 11/28/2020 1055   BILIRUBINUR neg 07/30/2013 1127   BILIRUBINUR Negative 01/03/2012 1117   KETONESUR NEGATIVE 11/28/2020 1055   PROTEINUR 30 (A) 11/28/2020 1055   UROBILINOGEN 0.2 01/21/2020 1223   NITRITE NEGATIVE 11/28/2020 1055   LEUKOCYTESUR NEGATIVE 11/28/2020 1055   LEUKOCYTESUR Negative 01/03/2012 1117   Sepsis Labs Recent Labs  Lab 01/17/22 1256 01/18/22 0528  WBC 7.3 7.7   Microbiology No results found for this or any previous visit (from the past 240 hour(s)). Imaging DG Chest 2 View  Result Date: 01/17/2022 CLINICAL DATA:  Tachycardia. Shortness of breath. EXAM: CHEST - 2 VIEW COMPARISON:  Two-view chest x-ray 07/29/2021 FINDINGS: Heart is enlarged. A left pleural effusion is present. Asymmetric left basilar airspace opacity is present. Right lung is clear. No edema or effusion is present. Chronic pneumoperitoneum is again seen. IMPRESSION: 1. Left pleural effusion and associated airspace disease, likely atelectasis. Infection is not excluded. 2. Chronic pneumoperitoneum. The patient is on hemodialysis and has a had a similar appearance of  pneumoperitoneum in the past. Electronically Signed   By: San Morelle M.D.   On: 01/17/2022 13:23      Time coordinating discharge: Over 30 minutes  SIGNED:  Emeterio Reeve DO [name]

## 2022-01-18 NOTE — TOC CM/SW Note (Signed)
Patient has orders to discharge home today. Chart reviewed. PCP is Deborra Medina, MD. On room air. No wounds. No TOC needs identified. CSW signing off.  Dayton Scrape, [place]

## 2022-01-19 LAB — HEPATITIS B SURFACE ANTIBODY, QUANTITATIVE: Hep B S AB Quant (Post): <3.1 m[IU]/mL — ABNORMAL LOW (ref >9.9)

## 2022-02-04 ENCOUNTER — Ambulatory Visit: Payer: Medicare PPO | Attending: Internal Medicine | Admitting: Internal Medicine

## 2022-02-04 ENCOUNTER — Encounter: Payer: Self-pay | Admitting: Internal Medicine

## 2022-02-04 VITALS — BP 188/60 | HR 52 | Ht 66.0 in | Wt 148.0 lb

## 2022-02-04 DIAGNOSIS — I480 Paroxysmal atrial fibrillation: Secondary | ICD-10-CM

## 2022-02-04 DIAGNOSIS — I5022 Chronic systolic (congestive) heart failure: Secondary | ICD-10-CM

## 2022-02-04 DIAGNOSIS — I4892 Unspecified atrial flutter: Secondary | ICD-10-CM

## 2022-02-04 DIAGNOSIS — N186 End stage renal disease: Secondary | ICD-10-CM | POA: Diagnosis not present

## 2022-02-04 DIAGNOSIS — I429 Cardiomyopathy, unspecified: Secondary | ICD-10-CM

## 2022-02-04 DIAGNOSIS — I10 Essential (primary) hypertension: Secondary | ICD-10-CM

## 2022-02-04 DIAGNOSIS — I4891 Unspecified atrial fibrillation: Secondary | ICD-10-CM | POA: Diagnosis not present

## 2022-02-04 NOTE — Patient Instructions (Addendum)
Medication Instructions:  - Your physician recommends that you continue on your current medications as directed. Please refer to the Current Medication list given to you today.  Please pick up your clonidine prescription at the pharmacy and resume this.   *If you need a refill on your cardiac medications before your next appointment, please call your pharmacy*   Lab Work: - none ordered  If you have labs (blood work) drawn today and your tests are completely normal, you will receive your results only by: [place] (if you have MyChart) OR A paper copy in the mail If you have any lab test that is abnormal or we need to change your treatment, we will call you to review the results.   Testing/Procedures: - none ordered   Follow-Up: At [facility], you and your health needs are our priority.  As part of our continuing mission to provide you with exceptional heart care, we have created designated Provider Care Teams.  These Care Teams include your primary Cardiologist (physician) and Advanced Practice Providers (APPs -  Physician Assistants and Nurse Practitioners) who all work together to provide you with the care you need, when you need it.  We recommend signing up for the patient portal called "MyChart".  Sign up information is provided on this After Visit Summary.  MyChart is used to connect with patients for Virtual Visits (Telemedicine).  Patients are able to view lab/test results, encounter notes, upcoming appointments, etc.  Non-urgent messages can be sent to your provider as well.   To learn more about what you can do with MyChart, go to [email or web address].    Your next appointment:   1 month(s)  The format for your next appointment:   In Person  Provider:   You may see [name], MD or one of the following Advanced Practice Providers on your designated Care Team:   Germer Hodgkins, NP Christell Faith, PA-C Cadence Kathlen Mody, PA-C Gerrie Nordmann, NP     Other Instructions N/a  Important Information About Sugar

## 2022-02-04 NOTE — Progress Notes (Unsigned)
Follow-up Outpatient Visit Date: 02/04/2022  Primary Care Provider: Crecencio Mc, MD [place] [address]  Chief Complaint: Follow-up atrial fibrillation/flutter, cardiomyopathy and hypertension  HPI:  [name] is a 76 y.o. male with history of Joanne Brander-stage renal disease on peritoneal dialysis, cardiomyopathy with an EF of 40-45%, prior abnormal stress test, paroxysmal atrial fibrillation/flutter, hypertension, hyperlipidemia, peripheral arterial disease with chronic dissection of the left subclavian artery followed by vascular surgery, sleep apnea, renal cell carcinoma status post left heminephrectomy, tobacco abuse, stroke, and adenocarcinoma of the appendix, who presents for follow-up of atrial flutter and heart failure.  He was hospitalized last month with increasing shortness of breath and palpitations in the setting of possible PDE machine malfunction.  He was noted to be in atrial flutter but converted spontaneously to sinus bradycardia.  He was continued on amiodarone, bisoprolol, and apixaban.  Peritoneal dialysis was reinitiated under the direction of nephrology.  Today, [name] reports that he does not feel great though he has not had any further palpitations or elevated heart rate since leaving the hospital.  He has rare right-sided chest pain that has occurred off and on for years and appears stable.  He is tolerating his new peritoneal dialysis regimen well though he is not sure that it has helped him feel any better.  He continues to complain of frequent itching and was placed on hydroxyzine by dermatology.  This seems to lessen his anxiety and frustration with the itching, though the sensation does not completely resolve.  He notes that his blood pressures remain quite elevated at home though he has significant orthostatic drops (190/69 sitting, down to 161/65 when standing up).  He has been compliant with most of his medications but notes that he ran  out of clonidine patch a few weeks ago and has not refilled this.  He has mild swelling of the left leg and arm, chronic.  He has stable exertional dyspnea.  He has not had any lightheadedness or passed out.  --------------------------------------------------------------------------------------------------  Past Medical History:  Diagnosis Date   Acute on chronic systolic congestive heart failure [facility])    Adenocarcinoma of appendix [facility]) Jan 2006   right kidney, s/p cryoablation   Atrial fibrillation with rapid ventricular response ([place]) 05/12/2020   Cardiomyopathy ([place])    a. 12/2018 Echo: EF 40-45%, global HK. Asc Ao 3.7cm; b. 01/2019 Lexi MV: small, mild, fixed basal and mid antlat defect - scar vs artifact. Small, mild mid and apical inf minimally reversible defect, likely scar w/ peri-infarct ischemia. Coronary and Ao atherosclerosis; c. 12/2019 Echo: EF 40-45%, glob HK, Gr1 DD. Nl RV fxn. Sev dil LA. Mild MR. Mild-mod Ao sclerosis w/o stenosis. Asc Ao 38m.   Carotid arterial disease ([place]    a. 01/2020 RICA 1-39%, RCCA/RECA < 560% LICA 14-54% LCCA/LECA <50%.   Claudication ([facility]    a. 02/2020 ABI/TBI: R 1.08/0.95, L 1.00/0.70.   Complication of anesthesia    had to be woken up slowly as his bp was elevated when did this quickly   Diabetes mellitus without complication (HCC)    ESRD ([place] stage renal disease) ([place]    a. Peritoneal Dialysis pt.   Hyperlipidemia    Hypertension    Migraine    cluster   Neuromuscular disorder (HCC)    left lower extrem neuropathy   Obstructive sleep apnea    no OSA since had facial surgery with dr. jKathyrn Sheriffin 1997   PAD (peripheral artery disease) ([place]  Feb 2009   nonobstructing, renal angiogram [name]   Renal cell carcinoma 2004   left kidney heminephrectomy   Renal insufficiency    Sepsis ([place]) 11/06/2020   Stroke ([place])    a. 12/2019 MRI: Small acute infarcts involving the left cerebral hemisphere and several chronic infarcts.   TIA (transient  ischemic attack)    no residual but left leg and foot still feel heavy   tobacco abuse    Past Surgical History:  Procedure Laterality Date   CAPD INSERTION N/A 03/01/2019   Procedure: LAPAROSCOPIC INSERTION CONTINUOUS AMBULATORY PERITONEAL DIALYSIS  (CAPD) CATHETER;  Surgeon: Algernon Huxley, MD;  Location: [facility] ORS;  Service: Vascular;  Laterality: N/A;   CARDIAC CATHETERIZATION     Dr. [name] did this to assess his renal artery   cyst removal  12/25/2015   Spine L4 and L5   heminephrectomy  2004   for renal cell CA   RENAL CRYOABLATION  Jan 2006   right kidney,  [name]   sciatica       Current Meds  Medication Sig   albuterol (VENTOLIN HFA) 108 (90 Base) MCG/ACT inhaler Inhale 2 puffs into the lungs every 6 (six) hours as needed for wheezing or shortness of breath.   amiodarone (PACERONE) 200 MG tablet Take 1 tablet (200 mg total) by mouth daily.   apixaban (ELIQUIS) 5 MG TABS tablet Take 1 tablet (5 mg total) by mouth 2 (two) times daily.   Ascorbic Acid 500 MG CHEW Chew 1 tablet by mouth daily.   b complex vitamins capsule Take 1 capsule by mouth daily.   bisoprolol (ZEBETA) 5 MG tablet Take 1 tablet (5 mg total) by mouth 2 (two) times daily.   cloNIDine (CATAPRES - DOSED IN MG/24 HR) 0.1 mg/24hr patch Place 1 patch (0.1 mg total) onto the skin every Friday.   doxazosin (CARDURA) 8 MG tablet Take 8 mg by mouth daily.   ferric citrate (AURYXIA) 1 GM 210 MG(Fe) tablet Take 1 tablet by mouth 3 (three) times daily.   fluticasone (FLONASE) 50 MCG/ACT nasal spray Place 2 sprays into both nostrils daily as needed for allergies or rhinitis.   gentamicin cream (GARAMYCIN) 0.1 % Apply 1 application topically daily.   hydrOXYzine (ATARAX) 25 MG tablet Take 25 mg by mouth 3 (three) times daily.   losartan (COZAAR) 100 MG tablet Take 1 tablet (100 mg total) by mouth daily.   multivitamin (RENA-VIT) TABS tablet Take 1 tablet by mouth at bedtime.   sevelamer (RENAGEL) 800 MG tablet Take 800 mg  by mouth 3 (three) times daily with meals.   torsemide (DEMADEX) 100 MG tablet Take 100 mg by mouth daily.    Allergies: Irbesartan, Cymbalta [duloxetine hcl], Hydralazine, Imdur [isosorbide nitrate], Rosuvastatin, Atorvastatin, and Bystolic [nebivolol hcl]  Social History   Tobacco Use   Smoking status: Every Day    Packs/day: 1.00    Years: 50.00    Total pack years: 50.00    Types: Cigarettes   Smokeless tobacco: Never  Vaping Use   Vaping Use: Former   Devices: tried but did not like  Substance Use Topics   Alcohol use: Not Currently   Drug use: No    Family History  Problem Relation Age of Onset   Hypertension Mother    Cancer Mother        breast   Aneurysm Mother    Coronary artery disease Father    Hypertension Father    Stroke Father 40  Heart disease Father    Heart attack Father 81   Aneurysm Maternal Grandmother        brain   Aneurysm Paternal Grandmother        brain   Coronary artery disease Paternal Grandfather    Heart disease Brother        valvular heart disease   COPD Brother    Hypertension Brother    Stroke Paternal Uncle     Review of Systems: A 12-system review of systems was performed and was negative except as noted in the HPI.  --------------------------------------------------------------------------------------------------  Physical Exam: BP (!) 190/80 (BP Location: Left Arm, Patient Position: Sitting, Cuff Size: Normal)   Pulse (!) 52   Ht '5\' 6"'$  (1.676 m)   Wt 148 lb (67.1 kg)   SpO2 97%   BMI 23.89 kg/m  Repeat BP: 188/60  General:  NAD. Neck: No JVD or HJR. Lungs: Clear to auscultation bilaterally without wheezes or crackles. Heart: Bradycardic but regular without murmurs, rubs, or gallops. Abdomen: Soft, nontender, nondistended. Extremities: Trace left ankle edema.  EKG: Sinus bradycardia with LAFB, left ventricular hypertrophy, poor R wave progression, nonspecific ST segment changes, and prolonged QT (QTc 515 ms).   Compared to prior tracing from 01/17/2022, heart rate has decreased and sinus bradycardia has replaced atrial flutter with variable block.  Lab Results  Component Value Date   WBC 7.7 01/18/2022   HGB 8.5 (L) 01/18/2022   HCT 26.8 (L) 01/18/2022   MCV 92.1 01/18/2022   PLT 148 (L) 01/18/2022    Lab Results  Component Value Date   NA 141 01/18/2022   K 4.9 01/18/2022   CL 108 01/18/2022   CO2 23 01/18/2022   BUN 54 (H) 01/18/2022   CREATININE 8.45 (H) 01/18/2022   GLUCOSE 87 01/18/2022   ALT 9 09/09/2021    Lab Results  Component Value Date   CHOL 122 05/12/2020   HDL 25 (L) 05/12/2020   LDLCALC 61 05/12/2020   LDLDIRECT 134.0 05/11/2017   TRIG 180 (H) 05/12/2020   CHOLHDL 4.9 05/12/2020    --------------------------------------------------------------------------------------------------  ASSESSMENT AND PLAN: Resistant hypertension: Blood pressure continues to be very difficult to control, which has been an ongoing issue with [name].  This may have been exacerbated by noncompliance with clonidine patch for the last few weeks.  I have encouraged him to pick up his prescription today and to apply the patch as soon as possible.  We will not make any other medication changes today.  Chronic HFrEF: [name] appears grossly euvolemic.  He still has generalized malaise and some exertional dyspnea consistent with NYHA class II-3 heart failure.  We discussed the role for repeating an echocardiogram, though in the short-term, this would not alter our management much.  If his symptoms do not improve with blood pressure control, I think it would be prudent to pursue right and left heart catheterization to better understand his hemodynamics and coronary anatomy.  In the meantime, we will continue current doses of bisoprolol, losartan, and torsemide.  Atrial fibrillation/flutter: [name] is in sinus bradycardia today and has not had any further palpitations since leaving the  hospital.  We will continue current doses of amiodarone, bisoprolol, and apixaban.  Rickard Kennerly-stage renal disease: Continue close follow-up with nephrology for management of [name]-stage renal disease on peritoneal dialysis.  Follow-up: Return to clinic in 1 month.  [name], MD 02/04/2022 8:58 AM

## 2022-02-05 ENCOUNTER — Ambulatory Visit: Payer: Medicare PPO | Admitting: Urology

## 2022-02-05 ENCOUNTER — Encounter: Payer: Self-pay | Admitting: Urology

## 2022-02-05 ENCOUNTER — Telehealth: Payer: Self-pay | Admitting: Urology

## 2022-02-05 ENCOUNTER — Encounter: Payer: Self-pay | Admitting: Internal Medicine

## 2022-02-05 ENCOUNTER — Ambulatory Visit: Payer: Medicare PPO | Admitting: Cardiology

## 2022-02-05 VITALS — BP 143/84 | HR 76 | Ht 66.0 in | Wt 143.0 lb

## 2022-02-05 DIAGNOSIS — C61 Malignant neoplasm of prostate: Secondary | ICD-10-CM

## 2022-02-05 DIAGNOSIS — N281 Cyst of kidney, acquired: Secondary | ICD-10-CM

## 2022-02-05 DIAGNOSIS — Z85528 Personal history of other malignant neoplasm of kidney: Secondary | ICD-10-CM | POA: Diagnosis not present

## 2022-02-05 NOTE — Telephone Encounter (Signed)
Patient left lab after office visit stating he was not going to have his PSA drawn.  Please call recommend reschedule lab visit.  Also recommend a follow-up abdominal CT with his history of kidney cancer

## 2022-02-05 NOTE — Progress Notes (Signed)
02/05/2022 11:31 AM   [name] [birth year] [id number]  Referring provider: Crecencio Mc, MD [place],  [place] [address]  Chief Complaint  Patient presents with   Follow-up    Urologic history: 1.  Low risk prostate cancer Diagnosed 2010 Dr. [name]; 1 core Gleason 3+3 adenocarcinoma (20%) Lost to follow-up; confirmatory biopsy 02/26/2014 with 41 g prostate and no cancer, ASAP, HGPIN PSA bump 12/2019 9.98; repeat 02/28/2020 5.1   2.  History of renal cell carcinoma Partial left nephrectomy Dr. [name] 2004 Cryoablation right renal lesion 2006 On peritoneal dialysis  HPI: 76 y.o. male presents for annual follow-up.  Doing well since last visit No bothersome LUTS Denies dysuria, gross hematuria Denies flank, abdominal or pelvic pain Has not had a PSA since April 2022   PMH: Past Medical History:  Diagnosis Date   Acute on chronic systolic congestive heart failure [facility])    Adenocarcinoma of appendix [facility]) Jan 2006   right kidney, s/p cryoablation   Atrial fibrillation with rapid ventricular response ([place]) 05/12/2020   Cardiomyopathy ([place])    a. 12/2018 Echo: EF 40-45%, global HK. Asc Ao 3.7cm; b. 01/2019 Lexi MV: small, mild, fixed basal and mid antlat defect - scar vs artifact. Small, mild mid and apical inf minimally reversible defect, likely scar w/ peri-infarct ischemia. Coronary and Ao atherosclerosis; c. 12/2019 Echo: EF 40-45%, glob HK, Gr1 DD. Nl RV fxn. Sev dil LA. Mild MR. Mild-mod Ao sclerosis w/o stenosis. Asc Ao 90m.   Carotid arterial disease ([facility]    a. 01/2020 RICA 1-39%, RCCA/RECA < 531% LICA 14-97% LCCA/LECA <50%.   Claudication ([place]    a. 02/2020 ABI/TBI: R 1.08/0.95, L 1.00/0.70.   Complication of anesthesia    had to be woken up slowly as his bp was elevated when did this quickly   Diabetes mellitus without complication (HCC)    ESRD (end stage renal disease) ([place]    a. Peritoneal Dialysis pt.   Hyperlipidemia     Hypertension    Migraine    cluster   Neuromuscular disorder ([place]    left lower extrem neuropathy   Obstructive sleep apnea    no OSA since had facial surgery with dr. jKathyrn Sheriffin 1997   PAD (peripheral artery disease) ([facility] Feb 2009   nonobstructing, renal angiogram ([name]   Renal cell carcinoma 2004   left kidney heminephrectomy   Renal insufficiency    Sepsis ([place] 11/06/2020   Stroke ([facility]    a. 12/2019 MRI: Small acute infarcts involving the left cerebral hemisphere and several chronic infarcts.   TIA (transient ischemic attack)    no residual but left leg and foot still feel heavy   tobacco abuse     Surgical History: Past Surgical History:  Procedure Laterality Date   CAPD INSERTION N/A 03/01/2019   Procedure: LAPAROSCOPIC INSERTION CONTINUOUS AMBULATORY PERITONEAL DIALYSIS  (CAPD) CATHETER;  Surgeon: DAlgernon Huxley MD;  Location: [facility] ORS;  Service: Vascular;  Laterality: N/A;   CARDIAC CATHETERIZATION     Dr. AFletcher Anondid this to assess his renal artery   cyst removal  12/25/2015   Spine L4 and L5   heminephrectomy  2004   for renal cell CA   RENAL CRYOABLATION  Jan 2006   right kidney,  [name]   sciatica      Home Medications:  Allergies as of 02/05/2022       Reactions   Irbesartan Other (See Comments)   hyperkalemia  Cymbalta [duloxetine Hcl]    Hydralazine Other (See Comments)   headache   Imdur [isosorbide Nitrate] Other (See Comments)   headache   Rosuvastatin    Breast swelling/soreness   Atorvastatin Other (See Comments)   Muscle pain   Bystolic [nebivolol Hcl]    Extreme fatigue         Medication List        Accurate as of February 05, 2022 11:31 AM. If you have any questions, ask your nurse or doctor.          albuterol 108 (90 Base) MCG/ACT inhaler Commonly known as: VENTOLIN HFA Inhale 2 puffs into the lungs every 6 (six) hours as needed for wheezing or shortness of breath.   amiodarone 200 MG tablet Commonly known as:  PACERONE Take 1 tablet (200 mg total) by mouth daily.   apixaban 5 MG Tabs tablet Commonly known as: Eliquis Take 1 tablet (5 mg total) by mouth 2 (two) times daily.   Ascorbic Acid 500 MG Chew Chew 1 tablet by mouth daily.   b complex vitamins capsule Take 1 capsule by mouth daily.   bisoprolol 5 MG tablet Commonly known as: ZEBETA Take 1 tablet (5 mg total) by mouth 2 (two) times daily.   cloNIDine 0.1 mg/24hr patch Commonly known as: CATAPRES - Dosed in mg/24 hr Place 1 patch (0.1 mg total) onto the skin every Friday.   doxazosin 8 MG tablet Commonly known as: CARDURA Take 8 mg by mouth daily.   ferric citrate 1 GM 210 MG(Fe) tablet Commonly known as: AURYXIA Take 1 tablet by mouth 3 (three) times daily.   fluticasone 50 MCG/ACT nasal spray Commonly known as: FLONASE Place 2 sprays into both nostrils daily as needed for allergies or rhinitis.   gentamicin cream 0.1 % Commonly known as: GARAMYCIN Apply 1 application topically daily.   hydrOXYzine 25 MG tablet Commonly known as: ATARAX Take 25 mg by mouth 3 (three) times daily.   irbesartan 300 MG tablet Commonly known as: AVAPRO Take 300 mg by mouth daily.   losartan 100 MG tablet Commonly known as: COZAAR Take 1 tablet (100 mg total) by mouth daily.   multivitamin Tabs tablet Take 1 tablet by mouth at bedtime.   sevelamer 800 MG tablet Commonly known as: RENAGEL Take 800 mg by mouth 3 (three) times daily with meals.   torsemide 100 MG tablet Commonly known as: DEMADEX Take 100 mg by mouth daily.        Allergies:  Allergies  Allergen Reactions   Irbesartan Other (See Comments)    hyperkalemia   Cymbalta [Duloxetine Hcl]    Hydralazine Other (See Comments)    headache   Imdur [Isosorbide Nitrate] Other (See Comments)    headache   Rosuvastatin     Breast swelling/soreness   Atorvastatin Other (See Comments)    Muscle pain   Bystolic [Nebivolol Hcl]     Extreme fatigue     Family  History: Family History  Problem Relation Age of Onset   Hypertension Mother    Cancer Mother        breast   Aneurysm Mother    Coronary artery disease Father    Hypertension Father    Stroke Father 90   Heart disease Father    Heart attack Father 52   Aneurysm Maternal Grandmother        brain   Aneurysm Paternal Grandmother        brain   Coronary artery disease  Paternal Grandfather    Heart disease Brother        valvular heart disease   COPD Brother    Hypertension Brother    Stroke Paternal Uncle     Social History:  reports that he has been smoking cigarettes. He has a 50.00 pack-year smoking history. He has never used smokeless tobacco. He reports that he does not currently use alcohol. He reports that he does not use drugs.   Physical Exam: BP (!) 143/84   Pulse 76   Ht '5\' 6"'$  (1.676 m)   Wt 143 lb (64.9 kg)   BMI 23.08 kg/m   Constitutional:  Alert and oriented, No acute distress. HEENT: [place] AT, moist mucus membranes.  Trachea midline, no masses. Cardiovascular: No clubbing, cyanosis, or edema. Skin: No rashes, bruises or suspicious lesions. GU: Prostate 40 g, smooth without nodules Neurologic: Grossly intact, no focal deficits, moving all 4 extremities. Psychiatric: Normal mood and affect.   Assessment & Plan:    1.  Low risk prostate cancer PSA ordered today; will notify with results Continue annual follow-up  2.  BPH with LUTS Stable on doxazosin  3.  History of renal cell carcinoma Schedule follow-up CT     Abbie Sons, MD  [place] [address], [place], [place] [address] [phone]

## 2022-02-08 ENCOUNTER — Other Ambulatory Visit: Payer: Medicare PPO

## 2022-02-08 DIAGNOSIS — C61 Malignant neoplasm of prostate: Secondary | ICD-10-CM

## 2022-02-08 NOTE — Telephone Encounter (Signed)
Patient came in today to have PSA drawn. Patient notified and will have CT.

## 2022-02-09 LAB — PSA: Prostate Specific Ag, Serum: 8.1 ng/mL — ABNORMAL HIGH (ref 0.0–4.0)

## 2022-02-11 ENCOUNTER — Encounter: Payer: Self-pay | Admitting: *Deleted

## 2022-02-16 ENCOUNTER — Ambulatory Visit (INDEPENDENT_AMBULATORY_CARE_PROVIDER_SITE_OTHER): Payer: Medicare PPO | Admitting: Vascular Surgery

## 2022-02-16 ENCOUNTER — Ambulatory Visit (INDEPENDENT_AMBULATORY_CARE_PROVIDER_SITE_OTHER): Payer: Medicare PPO

## 2022-02-16 ENCOUNTER — Encounter (INDEPENDENT_AMBULATORY_CARE_PROVIDER_SITE_OTHER): Payer: Self-pay | Admitting: Vascular Surgery

## 2022-02-16 VITALS — BP 194/84 | HR 57 | Resp 16 | Wt 148.8 lb

## 2022-02-16 DIAGNOSIS — I10 Essential (primary) hypertension: Secondary | ICD-10-CM

## 2022-02-16 DIAGNOSIS — I6523 Occlusion and stenosis of bilateral carotid arteries: Secondary | ICD-10-CM

## 2022-02-16 DIAGNOSIS — E1122 Type 2 diabetes mellitus with diabetic chronic kidney disease: Secondary | ICD-10-CM

## 2022-02-16 DIAGNOSIS — N186 End stage renal disease: Secondary | ICD-10-CM

## 2022-02-16 DIAGNOSIS — Z992 Dependence on renal dialysis: Secondary | ICD-10-CM

## 2022-02-16 DIAGNOSIS — I7779 Dissection of other artery: Secondary | ICD-10-CM | POA: Diagnosis not present

## 2022-02-16 NOTE — Assessment & Plan Note (Signed)
Duplex today shows stable 1 to 39% ICA stenosis bilaterally without significant progression from previous studies.  He also has normal flow dynamics in his subclavian artery even with a history of a left subclavian artery dissection previously.  Vertebral arteries remain antegrade.  No symptoms with normal flow.  No role for intervention.  Can be checked with his carotid duplex in 1 year.

## 2022-02-16 NOTE — Assessment & Plan Note (Signed)
Duplex today shows stable 1 to 39% ICA stenosis bilaterally without significant progression from previous studies.  He also has normal flow dynamics in his subclavian artery even with a history of a left subclavian artery dissection previously.  Vertebral arteries remain antegrade.  No change in medical regimen.  Recheck in 1 year.

## 2022-02-16 NOTE — Progress Notes (Signed)
MRN : [id number]  [name] is a 76 y.o. (03-18-1946) male who presents with chief complaint of  Chief Complaint  Patient presents with   Follow-up    Ultrasound follow up  .  History of Present Illness: Patient returns today in follow up of multiple vascular issues.  He has previously been followed for mild carotid artery disease and history of a left subclavian artery dissection.  He has no focal neurologic symptoms. Specifically, the patient denies amaurosis fugax, speech or swallowing difficulties, or arm or leg weakness or numbness. Duplex today shows stable 1 to 39% ICA stenosis bilaterally without significant progression from previous studies.  He also has normal flow dynamics in his subclavian artery even with a history of a left subclavian artery dissection previously.  Vertebral arteries remain antegrade. He is also on dialysis having had a peritoneal dialysis catheter placed 2 to 3 years ago.  He has been having excessive itching and his nephrologist has mentioned that he may need hemodialysis to help get his levels down.  He is scheduled to see his nephrologist again tomorrow.  Current Outpatient Medications  Medication Sig Dispense Refill   albuterol (VENTOLIN HFA) 108 (90 Base) MCG/ACT inhaler Inhale 2 puffs into the lungs every 6 (six) hours as needed for wheezing or shortness of breath. 8 g 11   amiodarone (PACERONE) 200 MG tablet Take 1 tablet (200 mg total) by mouth daily. 90 tablet 0   apixaban (ELIQUIS) 5 MG TABS tablet Take 1 tablet (5 mg total) by mouth 2 (two) times daily. 60 tablet 11   Ascorbic Acid 500 MG CHEW Chew 1 tablet by mouth daily.     b complex vitamins capsule Take 1 capsule by mouth daily.     bisoprolol (ZEBETA) 5 MG tablet Take 1 tablet (5 mg total) by mouth 2 (two) times daily. 60 tablet 0   cloNIDine (CATAPRES - DOSED IN MG/24 HR) 0.1 mg/24hr patch Place 1 patch (0.1 mg total) onto the skin every Friday. 12 patch 3   doxazosin (CARDURA) 8 MG  tablet Take 8 mg by mouth daily.     ferric citrate (AURYXIA) 1 GM 210 MG(Fe) tablet Take 1 tablet by mouth 3 (three) times daily.     fluticasone (FLONASE) 50 MCG/ACT nasal spray Place 2 sprays into both nostrils daily as needed for allergies or rhinitis.     gentamicin cream (GARAMYCIN) 0.1 % Apply 1 application topically daily. 15 g 0   hydrOXYzine (ATARAX) 25 MG tablet Take 25 mg by mouth 3 (three) times daily.     irbesartan (AVAPRO) 300 MG tablet Take 300 mg by mouth daily.     losartan (COZAAR) 100 MG tablet Take 1 tablet (100 mg total) by mouth daily. 90 tablet 1   multivitamin (RENA-VIT) TABS tablet Take 1 tablet by mouth at bedtime.     sevelamer (RENAGEL) 800 MG tablet Take 800 mg by mouth 3 (three) times daily with meals.     torsemide (DEMADEX) 100 MG tablet Take 100 mg by mouth daily.     No current facility-administered medications for this visit.    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 12/2018 Echo: EF 40-45%, global HK. Asc Ao 3.7cm; b. 01/2019 Lexi MV:  small, mild, fixed basal and mid antlat defect - scar vs artifact. Small, mild mid and apical inf minimally reversible defect, likely scar w/ peri-infarct ischemia. Coronary and Ao atherosclerosis; c. 12/2019 Echo: EF 40-45%, glob HK, Gr1 DD. Nl RV fxn. Sev dil LA. Mild MR. Mild-mod Ao sclerosis w/o stenosis. Asc Ao 27m.   Carotid arterial disease ([facility]    a. 01/2020 RICA 1-39%, RCCA/RECA < 586% LICA 17-67% LCCA/LECA <50%.   Claudication ([facility]    a. 02/2020 ABI/TBI: R 1.08/0.95, L 1.00/0.70.   Complication of anesthesia    had to be woken up slowly as his bp was elevated when did this quickly   Diabetes mellitus without complication (HCC)    ESRD (end stage renal disease) ([place]    a.  Peritoneal Dialysis pt.   Hyperlipidemia    Hypertension    Migraine    cluster   Neuromuscular disorder ([facility]    left lower extrem neuropathy   Obstructive sleep apnea    no OSA since had facial surgery with dr. jKathyrn Sheriffin 1997   PAD (peripheral artery disease) ([facility] Feb 2009   nonobstructing, renal angiogram ([name]   Renal cell carcinoma 2004   left kidney heminephrectomy   Renal insufficiency    Sepsis ([facility] 11/06/2020   Stroke ([facility]    a. 12/2019 MRI: Small acute infarcts involving the left cerebral hemisphere and several chronic infarcts.   TIA (transient ischemic attack)    no residual but left leg and foot still feel heavy   tobacco abuse     Past Surgical History:  Procedure Laterality Date   CAPD INSERTION N/A 03/01/2019   Procedure: LAPAROSCOPIC INSERTION CONTINUOUS AMBULATORY PERITONEAL DIALYSIS  (CAPD) CATHETER;  Surgeon: DAlgernon Huxley MD;  Location: [facility] ORS;  Service: Vascular;  Laterality: N/A;   CARDIAC CATHETERIZATION     Dr. AFletcher Anondid this to assess his renal artery   cyst removal  12/25/2015   Spine L4 and L5   heminephrectomy  2004   for renal cell CA   RENAL CRYOABLATION  Jan 2006   right kidney,  [name]   sciatica       Social History   Tobacco Use   Smoking status: Every Day    Packs/day: 1.00    Years: 50.00    Total pack years: 50.00    Types: Cigarettes   Smokeless tobacco: Never  Vaping Use   Vaping Use: Former   Devices: tried but did not like  Substance Use Topics   Alcohol use: Not Currently   Drug use: No      Family History  Problem Relation Age of Onset   Hypertension Mother    Cancer Mother        breast   Aneurysm Mother    Coronary artery disease Father    Hypertension Father    Stroke Father 643  Heart disease Father    Heart attack Father 520  Aneurysm Maternal Grandmother        brain   Aneurysm Paternal Grandmother        brain   Coronary artery disease Paternal Grandfather    Heart disease Brother         valvular heart disease   COPD Brother    Hypertension Brother    Stroke Paternal Uncle      Allergies  Allergen Reactions   Irbesartan Other (See Comments)    hyperkalemia   Cymbalta [Duloxetine Hcl]    Hydralazine Other (See Comments)  headache   Imdur [Isosorbide Nitrate] Other (See Comments)    headache   Rosuvastatin     Breast swelling/soreness   Atorvastatin Other (See Comments)    Muscle pain   Bystolic [Nebivolol Hcl]     Extreme fatigue     REVIEW OF SYSTEMS (Negative unless checked)   Constitutional: '[]'$ Weight loss  '[]'$ Fever  '[]'$ Chills Cardiac: '[]'$ Chest pain   '[]'$ Chest pressure   '[]'$ Palpitations   '[]'$ Shortness of breath when laying flat   '[]'$ Shortness of breath at rest   '[x]'$ Shortness of breath with exertion. Vascular:  '[x]'$ Pain in legs with walking   '[]'$ Pain in legs at rest   '[]'$ Pain in legs when laying flat   '[]'$ Claudication   '[]'$ Pain in feet when walking  '[]'$ Pain in feet at rest  '[]'$ Pain in feet when laying flat   '[]'$ History of DVT   '[]'$ Phlebitis   '[]'$ Swelling in legs   '[]'$ Varicose veins   '[]'$ Non-healing ulcers Pulmonary:   '[]'$ Uses home oxygen   '[]'$ Productive cough   '[]'$ Hemoptysis   '[]'$ Wheeze  '[]'$ COPD   '[]'$ Asthma Neurologic:  '[]'$ Dizziness  '[]'$ Blackouts   '[]'$ Seizures   '[x]'$ History of stroke   '[x]'$ History of TIA  '[]'$ Aphasia   '[]'$ Temporary blindness   '[]'$ Dysphagia   '[]'$ Weakness or numbness in arms   '[x]'$ Weakness or numbness in legs Musculoskeletal:  '[]'$ Arthritis   '[]'$ Joint swelling   '[]'$ Joint pain   '[]'$ Low back pain Hematologic:  '[]'$ Easy bruising  '[]'$ Easy bleeding   '[]'$ Hypercoagulable state   '[x]'$ Anemic  '[]'$ Hepatitis Gastrointestinal:  '[]'$ Blood in stool   '[]'$ Vomiting blood  '[]'$ Gastroesophageal reflux/heartburn   '[]'$ Difficulty swallowing. Genitourinary:  '[x]'$ Chronic kidney disease   '[]'$ Difficult urination  '[]'$ Frequent urination  '[]'$ Burning with urination   '[]'$ Blood in urine Skin:  '[]'$ Rashes   '[]'$ Ulcers   '[]'$ Wounds Psychological:  '[]'$ History of anxiety   '[]'$  History of major depression.   Physical Examination  BP (!) 194/84  (BP Location: Right Arm)   Pulse (!) 57   Resp 16   Wt 148 lb 12.8 oz (67.5 kg)   BMI 24.02 kg/m  Gen:  WD/WN, NAD Head: [place]/AT, No temporalis wasting. Ear/Nose/Throat: Hearing grossly intact, nares w/o erythema or drainage Eyes: Conjunctiva clear. Sclera non-icteric Neck: Supple.  Trachea midline.  No carotid bruits present Pulmonary:  Good air movement, no use of accessory muscles.  Cardiac: RRR, no JVD Vascular:  Vessel Right Left  Radial Palpable Palpable                                   Gastrointestinal: soft, non-tender/non-distended. No guarding/reflex.  PD catheter in place in the left abdomen. Musculoskeletal: M/S 5/5 throughout.  No deformity or atrophy.  Trace lower extremity edema. Neurologic: Sensation grossly intact in extremities.  Symmetrical.  Speech is fluent.  Psychiatric: Judgment intact, Mood & affect appropriate for pt's clinical situation. Dermatologic: No rashes or ulcers noted.  No cellulitis or open wounds.      Labs Recent Results (from the past 2160 hour(s))  Basic metabolic panel     Status: Abnormal   Collection Time: 01/17/22 12:56 PM  Result Value Ref Range   Sodium 139 135 - 145 mmol/L   Potassium 5.8 (H) 3.5 - 5.1 mmol/L   Chloride 107 98 - 111 mmol/L   CO2 24 22 - 32 mmol/L   Glucose, Bld 157 (H) 70 - 99 mg/dL    Comment: Glucose reference range applies only to samples taken after fasting for at least  8 hours.   BUN 55 (H) 8 - 23 mg/dL   Creatinine, Ser 8.94 (H) 0.61 - 1.24 mg/dL   Calcium 8.4 (L) 8.9 - 10.3 mg/dL   GFR, Estimated 6 (L) >60 mL/min    Comment: (NOTE) Calculated using the CKD-EPI Creatinine Equation (2021)    Anion gap 8 5 - 15    Comment: Performed at [facility], [address] [place], [place] [address]  CBC     Status: Abnormal   Collection Time: 01/17/22 12:56 PM  Result Value Ref Range   WBC 7.3 4.0 - 10.5 K/uL   RBC 3.02 (L) 4.22 - 5.81 MIL/uL   Hemoglobin 8.8 (L) 13.0 - 17.0 g/dL   HCT  28.0 (L) 39.0 - 52.0 %   MCV 92.7 80.0 - 100.0 fL   MCH 29.1 26.0 - 34.0 pg   MCHC 31.4 30.0 - 36.0 g/dL   RDW 13.9 11.5 - 15.5 %   Platelets 145 (L) 150 - 400 K/uL   nRBC 0.0 0.0 - 0.2 %    Comment: Performed at [facility], [place], [place] [address]  Troponin I (High Sensitivity)     Status: Abnormal   Collection Time: 01/17/22 12:56 PM  Result Value Ref Range   Troponin I (High Sensitivity) 33 (H) <18 ng/L    Comment: (NOTE) Elevated high sensitivity troponin I (hsTnI) values and significant  changes across serial measurements may suggest ACS but many other  chronic and acute conditions are known to elevate hsTnI results.  Refer to the "Links" section for chest pain algorithms and additional  guidance. Performed at [facility], [address] [place], [place] [address]   Brain natriuretic peptide     Status: Abnormal   Collection Time: 01/17/22  1:42 PM  Result Value Ref Range   B Natriuretic Peptide 3,559.8 (H) 0.0 - 100.0 pg/mL    Comment: Performed at [facility], [place], [place] [address]  Troponin I (High Sensitivity)     Status: Abnormal   Collection Time: 01/17/22  3:24 PM  Result Value Ref Range   Troponin I (High Sensitivity) 33 (H) <18 ng/L    Comment: (NOTE) Elevated high sensitivity troponin I (hsTnI) values and significant  changes across serial measurements may suggest ACS but many other  chronic and acute conditions are known to elevate hsTnI results.  Refer to the "Links" section for chest pain algorithms and additional  guidance. Performed at [facility], [address] [place], [place] [address]   Hepatitis B core antibody, total     Status: None   Collection Time: 01/17/22  6:44 PM  Result Value Ref Range   Hep B Core Total Ab NON REACTIVE NON REACTIVE    Comment: Performed at [facility] [address], [place], [place] [address]  Hepatitis B core antibody, IgM      Status: None   Collection Time: 01/17/22  6:44 PM  Result Value Ref Range   Hep B C IgM NON REACTIVE NON REACTIVE    Comment: Performed at [facility] [address], [place], [place] [address]  Hepatitis B surface antibody,quantitative     Status: Abnormal   Collection Time: 01/17/22  6:44 PM  Result Value Ref Range   Hep B S AB Quant (Post) <3.1 (L) Immunity>9.9 mIU/mL    Comment: (NOTE)  Status of Immunity  Anti-HBs Level  ------------------                     -------------- Inconsistent with Immunity                   0.0 - 9.9 Consistent with Immunity                          >9.9 Performed At: [facility] [place], [place] [id number] Rush Farmer MD EH:[id number]   Hepatitis B surface antigen     Status: None   Collection Time: 01/17/22  6:44 PM  Result Value Ref Range   Hepatitis B Surface Ag NON REACTIVE NON REACTIVE    Comment: Performed at [facility] [address], [place], [place] [address]  Basic metabolic panel     Status: Abnormal   Collection Time: 01/18/22  5:28 AM  Result Value Ref Range   Sodium 141 135 - 145 mmol/L   Potassium 4.9 3.5 - 5.1 mmol/L   Chloride 108 98 - 111 mmol/L   CO2 23 22 - 32 mmol/L   Glucose, Bld 87 70 - 99 mg/dL    Comment: Glucose reference range applies only to samples taken after fasting for at least 8 hours.   BUN 54 (H) 8 - 23 mg/dL   Creatinine, Ser 8.45 (H) 0.61 - 1.24 mg/dL   Calcium 8.3 (L) 8.9 - 10.3 mg/dL   GFR, Estimated 6 (L) >60 mL/min    Comment: (NOTE) Calculated using the CKD-EPI Creatinine Equation (2021)    Anion gap 10 5 - 15    Comment: Performed at [facility], [address] [place], [place] [address]  CBC     Status: Abnormal   Collection Time: 01/18/22  5:28 AM  Result Value Ref Range   WBC 7.7 4.0 - 10.5 K/uL   RBC 2.91 (L) 4.22 - 5.81 MIL/uL   Hemoglobin 8.5 (L) 13.0 - 17.0 g/dL   HCT 26.8 (L) 39.0 - 52.0 %   MCV 92.1 80.0 -  100.0 fL   MCH 29.2 26.0 - 34.0 pg   MCHC 31.7 30.0 - 36.0 g/dL   RDW 13.9 11.5 - 15.5 %   Platelets 148 (L) 150 - 400 K/uL   nRBC 0.0 0.0 - 0.2 %    Comment: Performed at [facility], [address] [place], [place] [address]  PSA     Status: Abnormal   Collection Time: 02/08/22 10:25 AM  Result Value Ref Range   Prostate Specific Ag, Serum 8.1 (H) 0.0 - 4.0 ng/mL    Comment: Roche ECLIA methodology. According to the American Urological Association, Serum PSA should decrease and remain at undetectable levels after radical prostatectomy. The AUA defines biochemical recurrence as an initial PSA value 0.2 ng/mL or greater followed by a subsequent confirmatory PSA value 0.2 ng/mL or greater. Values obtained with different assay methods or kits cannot be used interchangeably. Results cannot be interpreted as absolute evidence of the presence or absence of malignant disease.     Radiology No results found.  Assessment/Plan DM (diabetes mellitus), type 2 with renal complications (HCC) blood glucose control important in reducing the progression of atherosclerotic disease. Also, involved in wound healing. On appropriate medications.     ESRD (end stage renal disease) ([place]) PD catheter placed about 3 years ago.  Has been having excessive itching and his nephrologist has mentioned that he  may need to have hemodialysis to get some of his levels down.  He is seeing his nephrologist tomorrow.  I have asked him to make sure that that is what we need to schedule, and after he discusses with his nephrologist we can get him on for a PermCath if that is the recommendation.   Hyperlipidemia lipid control important in reducing the progression of atherosclerotic disease. Continue statin therapy  Carotid stenosis Duplex today shows stable 1 to 39% ICA stenosis bilaterally without significant progression from previous studies.  He also has normal flow dynamics in his subclavian artery even  with a history of a left subclavian artery dissection previously.  Vertebral arteries remain antegrade.  No change in medical regimen.  Recheck in 1 year.  Dissection of left subclavian artery (HCC) Duplex today shows stable 1 to 39% ICA stenosis bilaterally without significant progression from previous studies.  He also has normal flow dynamics in his subclavian artery even with a history of a left subclavian artery dissection previously.  Vertebral arteries remain antegrade.  No symptoms with normal flow.  No role for intervention.  Can be checked with his carotid duplex in 1 year.    Leotis Pain, MD  02/16/2022 1:49 PM    This note was created with Dragon medical transcription system.  Any errors from dictation are purely unintentional

## 2022-03-02 ENCOUNTER — Other Ambulatory Visit: Payer: Self-pay | Admitting: Cardiology

## 2022-03-10 ENCOUNTER — Other Ambulatory Visit: Payer: Self-pay | Admitting: Internal Medicine

## 2022-03-15 NOTE — Progress Notes (Signed)
Cardiology Clinic Note   Patient Name: [name] Date of Encounter: 03/16/2022  Primary Care Provider:  Crecencio Mc, Torres Primary Cardiologist:  Brett Bush, Torres  Patient Profile    76 year old male with a history of end-stage renal disease currently on peritoneal dialysis, cardiomegaly with an EF of 40-45%, paroxysmal atrial fibrillation/atrial flutter essential hypertension, hyperlipidemia, peripheral arterial disease with chronic dissection of the left subclavian artery followed by vascular surgery, sleep apnea, renal cell carcinoma status post left heminephrectomy, tobacco abuse, CVA, and adenocarcinoma of the appendix, who is here today for follow-up.  Past Medical History    Past Medical History:  Diagnosis Date   Acute on chronic systolic congestive heart failure [facility])    Adenocarcinoma of appendix [facility]) Jan 2006   right kidney, s/p cryoablation   Atrial fibrillation with rapid ventricular response ([place]) 05/12/2020   Cardiomyopathy ([place])    a. 12/2018 Echo: EF 40-45%, global HK. Asc Ao 3.7cm; b. 01/2019 [facility] MV: small, mild, fixed basal and mid antlat defect - scar vs artifact. Small, mild mid and apical inf minimally reversible defect, likely scar w/ peri-infarct ischemia. Coronary and Ao atherosclerosis; c. 12/2019 Echo: EF 40-45%, glob HK, Gr1 DD. Nl RV fxn. Sev dil LA. Mild MR. Mild-mod Ao sclerosis w/o stenosis. Asc Ao 61m.   Carotid arterial disease ([place]    a. 01/2020 RICA 1-39%, RCCA/RECA < 582% LICA 19-93% LCCA/LECA <50%.   Claudication ([place]    a. 02/2020 ABI/TBI: R 1.08/0.95, L 1.00/0.70.   Complication of anesthesia    had to be woken up slowly as his bp was elevated when did this quickly   Diabetes mellitus without complication (HCC)    ESRD (end stage renal disease) ([place]    a. Peritoneal Dialysis pt.   Hyperlipidemia    Hypertension    Migraine    cluster   Neuromuscular disorder ([place]    left lower extrem neuropathy   Obstructive sleep apnea     no OSA since had facial surgery with dr. jKathyrn Torres 1997   PAD (peripheral artery disease) ([facility] Feb 2009   nonobstructing, renal angiogram ([name]   Renal cell carcinoma 2004   left kidney heminephrectomy   Renal insufficiency    Sepsis ([place] 11/06/2020   Stroke ([place]    a. 12/2019 MRI: Small acute infarcts involving the left cerebral hemisphere and several chronic infarcts.   TIA (transient ischemic attack)    no residual but left leg and foot still feel heavy   tobacco abuse    Past Surgical History:  Procedure Laterality Date   CAPD INSERTION N/A 03/01/2019   Procedure: LAPAROSCOPIC INSERTION CONTINUOUS AMBULATORY PERITONEAL DIALYSIS  (CAPD) CATHETER;  Surgeon: [name];  Location: [facility] ORS;  Service: Vascular;  Laterality: N/A;   CARDIAC CATHETERIZATION     [name] this to assess his renal artery   cyst removal  12/25/2015   Spine L4 and L5   heminephrectomy  2004   for renal cell CA   RENAL CRYOABLATION  Jan 2006   right kidney,  [name]   sciatica      Allergies  Allergies  Allergen Reactions   Irbesartan Other (See Comments)    hyperkalemia   Cymbalta [Duloxetine Hcl]    Hydralazine Other (See Comments)    headache   Imdur [Isosorbide Nitrate] Other (See Comments)    headache   Rosuvastatin     Breast swelling/soreness   Atorvastatin Other (See Comments)  Muscle pain   Bystolic [Nebivolol Hcl]     Extreme fatigue     History of Present Illness    [name] is a 76 year old male with a complex medical history of end-stage renal disease currently on peritoneal dialysis, cardiomyopathy with EF of 40-45%, paroxysmal atrial fibrillation/atrial flutter, essential hypertension, hyperlipidemia, peripheral arterial disease with chronic dissection of the left subclavian artery followed by vascular surgery, sleep apnea, renal cell carcinoma status dyslipidemia nephrectomy, tobacco use, CVA, and adenocarcinoma of the appendix.  His cardiac history  is significant for short dissection of the proximal left subclavian artery which was determined to be nonsurgical per vascular in August 2021 when he presented to the hospital with lack of coordination and multiple small infarcts involving the left cerebral hemisphere.  Subsequent event monitoring did not show atrial fibrillation.  In follow-up, he was started on isosorbide mononitrate for blood pressure control and antianginal for worsening DOE as an anginal equivalent but was stopped due to significant headaches.  He is also intolerant of hydralazine due to headache.  In December 2021 he was hospitalized for acute on chronic HFrEF and hypertensive urgency.  There was questions of PAF and MAS first arrived on scene though this cannot be confirmed by any tracings.  At that point time the decision to place him on anticoagulation was deferred.  He was placed on hydralazine, however and Imdur which was subsequently discontinued because he felt it was causing his headaches.  He was admitted to [facility] 01/17/2022 with chief concerns of palpitations and shortness of breath since about 3 days prior.  He presented to the peritoneal dialysis she home was not functioning appropriately.  He was hyperkalemic With a potassium of 5.8, hemoglobin of 8.8, and elevated BNP of 35-59.8, high-sensitivity troponins were 33.  He is going to A-fib/a flutter overnight and converted back to sinus rhythm he had remained bradycardic.  He was continued on amiodarone 200 mg daily, apixaban 5 mg twice daily, and physical 5 mg twice daily.  He appears to be euvolemic on exam with improved shortness of breath repeat echocardiogram was pending. Unfortunately during his hospitalization his echo was not completed.  He was discharged from the facility on 01/19/2022 with follow-up with cardiology and nephrology.  He was last seen in the office on 02/04/2022 by [name].  He denies any further palpitations elevated heart rate since leaving the hospital.   He did continue to have rare right-sided chest pain that occurred off and on for years that was stable.  He was tolerating his new peritoneal dialysis regiment.  He continues to complain of frequent itching and was placed on hydroxyzine by dermatology which seemed to help his symptoms some.  He noted that his blood pressures remain quite elevated at home and really has significant orthostatic drop sitting daily and with standing.  He has been compliant with most of his medications at that time and continued to have some mild swelling and stable exertional dyspnea.  During his visit there was a discussion about.  Repeating his echocardiogram.  Even though in the short-term but would not alter the management and care.  He was advised to pick up his clonidine patch to help with the issues with elevated blood pressure.  It was also discussed to proceed with a right and left heart catheterization to better understand his hemodynamics and coronary anatomy.  He returns to clinic today stating that overall he feels well.  He continues with dyspnea on exertion but denies any  chest pain.  Continues peritoneal dialysis with a new machine that was sent to his house.  Blood pressure is elevated on arrival today but he has just taken his medication.  At his last appointment he had not gotten his clonidine patches and he has picked those up since this time and is currently taking them.  There was question whether he was taking losartan or irbesartan.  Patient has a large quantity of losartan at home and is continued taking the losartan 100 mg daily.  We did explain to him that previously there is documented history that when he was on irbesartan he had elevated potassium levels and that the equivalent of 300 mg of irbesartan is equivalent to 100 mg of losartan.  Home Medications    Current Outpatient Medications  Medication Sig Dispense Refill   albuterol (VENTOLIN HFA) 108 (90 Base) MCG/ACT inhaler Inhale 2 puffs into the  lungs every 6 (six) hours as needed for wheezing or shortness of breath. 8 g 11   amiodarone (PACERONE) 200 MG tablet TAKE 1 TABLET BY MOUTH ONCE DAILY 90 tablet 0   apixaban (ELIQUIS) 5 MG TABS tablet Take 1 tablet (5 mg total) by mouth 2 (two) times daily. 60 tablet 11   Ascorbic Acid 500 MG CHEW Chew 1 tablet by mouth daily.     b complex vitamins capsule Take 1 capsule by mouth daily.     bisoprolol (ZEBETA) 5 MG tablet Take 1 tablet (5 mg total) by mouth 2 (two) times daily. 60 tablet 0   cloNIDine (CATAPRES - DOSED IN MG/24 HR) 0.1 mg/24hr patch Place 1 patch (0.1 mg total) onto the skin every Friday. 12 patch 3   cloNIDine (CATAPRES) 0.2 MG tablet Take 0.2 mg by mouth at bedtime.     doxazosin (CARDURA) 8 MG tablet Take 8 mg by mouth daily.     ferric citrate (AURYXIA) 1 GM 210 MG(Fe) tablet Take 1 tablet by mouth 3 (three) times daily.     fluticasone (FLONASE) 50 MCG/ACT nasal spray Place 2 sprays into both nostrils daily as needed for allergies or rhinitis.     gentamicin cream (GARAMYCIN) 0.1 % Apply 1 application topically daily. 15 g 0   hydrOXYzine (ATARAX) 25 MG tablet Take 25 mg by mouth 3 (three) times daily.     losartan (COZAAR) 100 MG tablet TAKE 1 TABLET BY MOUTH ONCE DAILY *DOSE INCREASE* 90 tablet 0   multivitamin (RENA-VIT) TABS tablet Take 1 tablet by mouth at bedtime.     sevelamer (RENAGEL) 800 MG tablet Take 800 mg by mouth 3 (three) times daily with meals.     torsemide (DEMADEX) 100 MG tablet Take 100 mg by mouth daily.     irbesartan (AVAPRO) 300 MG tablet Take 300 mg by mouth daily. (Patient not taking: Reported on 03/16/2022)     No current facility-administered medications for this visit.     Family History    Family History  Problem Relation Age of Onset   Hypertension Mother    Cancer Mother        breast   Aneurysm Mother    Coronary artery disease Father    Hypertension Father    Stroke Father 32   Heart disease Father    Heart attack Father 6    Aneurysm Maternal Grandmother        brain   Aneurysm Paternal Grandmother        brain   Coronary artery disease Paternal Grandfather  Heart disease Brother        valvular heart disease   COPD Brother    Hypertension Brother    Stroke Paternal Uncle    He indicated that his mother is deceased. He indicated that his father is deceased. He indicated that his brother is deceased. He indicated that his maternal grandmother is deceased. He indicated that his maternal grandfather is deceased. He indicated that the status of his paternal grandmother is unknown. He indicated that the status of his paternal grandfather is unknown. He indicated that his paternal uncle is deceased.  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  Tobacco Use   Smoking status: Every Day    Packs/day: 1.00    Years: 50.00    Total pack years: 50.00    Types: Cigarettes   Smokeless tobacco: Never  Vaping Use   Vaping Use: Former   Devices: tried but did not like  Substance and Sexual Activity   Alcohol use: Not Currently   Drug use: No   Sexual activity: Yes  Other Topics Concern   Not on file  Social History Narrative   Not on file   Social Determinants of Health   Financial Resource Strain: Low Risk  (04/21/2021)   Overall Financial Resource Strain (CARDIA)    Difficulty of Paying Living Expenses: Not hard at all  Food Insecurity: No Food Insecurity (04/21/2021)   Hunger Vital Sign    Worried About Running Out of Food in the Last Year: Never true    [place] in the Last Year: Never true  Transportation Needs: No Transportation Needs (04/21/2021)   PRAPARE - Hydrologist (Medical): No    Lack of Transportation  (Non-Medical): No  Physical Activity: Insufficiently Active (08/15/2020)   Exercise Vital Sign    Days of Exercise per Week: 3 days    Minutes of Exercise per Session: 20 min  Stress: No Stress Concern Present (04/21/2021)   [place]    Feeling of Stress : Not at all  Social Connections: Unknown (04/21/2021)   Social Connection and Isolation Panel [NHANES]    Frequency of Communication with Friends and Family: Not on file    Frequency of Social Gatherings with Friends and Family: Not on file    Attends Religious Services: Not on file    Active Member of Clubs or Organizations: Not on file    Attends Archivist Meetings: Not on file    Marital Status: Married  Intimate Partner Violence: Not At Risk (04/21/2021)   Humiliation, Afraid, Rape, and Kick questionnaire    Fear of Current or Ex-Partner: No    Emotionally Abused: No    Physically Abused: No    Sexually Abused: No     Review of Systems    General:  No chills, fever, night sweats or weight changes.  Endorses fatigue Cardiovascular:  No chest pain, endorses dyspnea on exertion, edema, orthopnea, palpitations, paroxysmal nocturnal dyspnea. Dermatological: No rash, lesions/masses Respiratory: Endorses cough, endorses chronic dyspnea Urologic: No hematuria, dysuria Abdominal:   No nausea, vomiting, diarrhea, bright red blood per rectum, melena, or hematemesis Neurologic:  No  visual changes, wkns, changes in mental status. All other systems reviewed and are otherwise negative except as noted above.   Physical Exam    VS:  BP (!) 180/80 (BP Location: Left Arm, Patient Position: Sitting, Cuff Size: Normal)   Pulse (!) 53   Ht '5\' 6"'$  (1.676 m)   Wt 152 lb 12.8 oz (69.3 kg)   SpO2 98%   BMI 24.66 kg/m  , BMI Body mass index is 24.66 kg/m.     Vitals:   03/16/22 1050 03/16/22 1102  BP: (!) 200/80 (!) 180/80    GEN: Well nourished, well developed,  in no acute distress. HEENT: normal. Neck: Supple, no JVD, carotid bruits, or masses. Cardiac: RRR, bradycardic, no murmurs, rubs, or gallops. No clubbing, cyanosis, edema.  Radials/DP/PT 2+ and equal bilaterally.  Respiratory:  Respirations regular and unlabored, clear to auscultation bilaterally. GI: Soft, nontender, nondistended, BS + x 4. MS: no deformity or atrophy. Skin: warm and dry, no rash.  Dry Neuro:  Strength and sensation are intact. Psych: Normal affect.  Accessory Clinical Findings    ECG personally reviewed by me today-sinus bradycardia with a rate of 53, left anterior fascicular block, left ventricular perjury with repolarization- No acute changes  Lab Results  Component Value Date   WBC 7.7 01/18/2022   HGB 8.5 (L) 01/18/2022   HCT 26.8 (L) 01/18/2022   MCV 92.1 01/18/2022   PLT 148 (L) 01/18/2022   Lab Results  Component Value Date   CREATININE 8.45 (H) 01/18/2022   BUN 54 (H) 01/18/2022   NA 141 01/18/2022   K 4.9 01/18/2022   CL 108 01/18/2022   CO2 23 01/18/2022   Lab Results  Component Value Date   ALT 9 09/09/2021   AST 7 09/09/2021   ALKPHOS 128 (H) 09/09/2021   BILITOT <0.2 09/09/2021   Lab Results  Component Value Date   CHOL 122 05/12/2020   HDL 25 (L) 05/12/2020   LDLCALC 61 05/12/2020   LDLDIRECT 134.0 05/11/2017   TRIG 180 (H) 05/12/2020   CHOLHDL 4.9 05/12/2020    Lab Results  Component Value Date   HGBA1C 6.9 (H) 11/21/2020    Assessment & Plan   1.  Resistant hypertension with blood pressures very difficult to control.  Blood pressure today is 200/80 repeat is 180/80 is negative patient had just taken his medication on the way here.  He has picked up his clonidine patches which he had had some noncompliance with prior to.  He stated that his nephrologist wanted him to take irbesartan in place of his losartan and he was concerned about making that change in his medication.  He has irbesartan listed as one of his allergies and due  to causing hyperkalemia in the past.  At today's visit he was continued on his losartan 100 mg daily and advised not to take irbesartan.  He is also being continued on his clonidine patch, clonidine 0.2 mg at bedtime, losartan, and torsemide he is also minimized to continue to monitor his pressures at home as he does have on occasion severe orthostasis.  2.  Chronic HFrEF.  With continued shortness of breath and dyspnea on exertion.  He denies any chest pain.  His symptoms are consistent with NYHA class II-III heart failure.  He does appear euvolemic on exam today.  He had previously discussed right and left heart catheterization to better understand hemodynamics.  Patient is concerned with receiving dye that would likely prompt him to be on hemodialysis  versus peritoneal dialysis.  Echocardiogram was done on/6/22 which revealed an LVEF of 45-50%, left ventricle with mildly decreased function, global hypokinesis, he is continued on torsemide, losartan, bisoprolol.  Continuation of escalation of GDMT is limited due to kidney disease.  Previously been discussed at follow-up appointments to repeat echocardiogram which would not really change treatment in the short-term but with his continuation of symptoms and continued dyspnea on exertion we will going to repeat that study and to ensure there has not been any drop in EF.  3.  Paroxysmal atrial fibrillation/atrial flutter he remains sinus bradycardia today on EKG.  Has not had any further palpitations since hospital discharge.  He is continued on amiodarone, apixaban for CHA2DS2-VASc score of at least 4, and bisoprolol.  4.  End-stage renal disease continued on peritoneal dialysis and followed by nephrology.  5.  Disposition patient return to clinic to see Torres/APP after echocardiogram has been completed to reevaluate EF and to follow-up on blood pressure room.  Azim Gillingham, NP 03/16/2022, 11:27 AM

## 2022-03-16 ENCOUNTER — Encounter: Payer: Self-pay | Admitting: Cardiology

## 2022-03-16 ENCOUNTER — Ambulatory Visit: Payer: Medicare PPO | Attending: Cardiology | Admitting: Cardiology

## 2022-03-16 VITALS — BP 180/80 | HR 53 | Ht 66.0 in | Wt 152.8 lb

## 2022-03-16 DIAGNOSIS — R0602 Shortness of breath: Secondary | ICD-10-CM | POA: Diagnosis not present

## 2022-03-16 DIAGNOSIS — I5022 Chronic systolic (congestive) heart failure: Secondary | ICD-10-CM | POA: Diagnosis not present

## 2022-03-16 DIAGNOSIS — I429 Cardiomyopathy, unspecified: Secondary | ICD-10-CM

## 2022-03-16 DIAGNOSIS — I4892 Unspecified atrial flutter: Secondary | ICD-10-CM

## 2022-03-16 DIAGNOSIS — I4891 Unspecified atrial fibrillation: Secondary | ICD-10-CM | POA: Diagnosis not present

## 2022-03-16 DIAGNOSIS — N186 End stage renal disease: Secondary | ICD-10-CM

## 2022-03-16 NOTE — Patient Instructions (Signed)
Medication Instructions:  No changes at this time.   *If you need a refill on your cardiac medications before your next appointment, please call your pharmacy*   Lab Work: None  If you have labs (blood work) drawn today and your tests are completely normal, you will receive your results only by: [place] (if you have MyChart) OR A paper copy in the mail If you have any lab test that is abnormal or we need to change your treatment, we will call you to review the results.   Testing/Procedures: Your physician has requested that you have an echocardiogram. Echocardiography is a painless test that uses sound waves to create images of your heart. It provides your doctor with information about the size and shape of your heart and how well your heart's chambers and valves are working. This procedure takes approximately one hour. There are no restrictions for this procedure. Please do NOT wear cologne, perfume, aftershave, or lotions (deodorant is allowed). Please arrive 15 minutes prior to your appointment time.    Follow-Up: At [facility], you and your health needs are our priority.  As part of our continuing mission to provide you with exceptional heart care, we have created designated Provider Care Teams.  These Care Teams include your primary Cardiologist (physician) and Advanced Practice Providers (APPs -  Physician Assistants and Nurse Practitioners) who all work together to provide you with the care you need, when you need it.   Your next appointment:   1 month(s)  The format for your next appointment:   In Person  Provider:   [name], MD or Gerrie Nordmann, NP        Important Information About Sugar

## 2022-03-22 ENCOUNTER — Encounter (INDEPENDENT_AMBULATORY_CARE_PROVIDER_SITE_OTHER): Payer: Self-pay

## 2022-03-25 ENCOUNTER — Ambulatory Visit
Admission: RE | Admit: 2022-03-25 | Discharge: 2022-03-25 | Disposition: A | Discharge status: Home / Self Care | Payer: Medicare PPO | Source: Ambulatory Visit | Attending: Urology | Admitting: Urology

## 2022-03-25 DIAGNOSIS — N281 Cyst of kidney, acquired: Secondary | ICD-10-CM | POA: Insufficient documentation

## 2022-03-25 DIAGNOSIS — Z85528 Personal history of other malignant neoplasm of kidney: Secondary | ICD-10-CM | POA: Insufficient documentation

## 2022-03-31 ENCOUNTER — Encounter: Payer: Self-pay | Admitting: *Deleted

## 2022-04-08 ENCOUNTER — Ambulatory Visit: Payer: Medicare PPO | Attending: Cardiology

## 2022-04-08 DIAGNOSIS — I5022 Chronic systolic (congestive) heart failure: Secondary | ICD-10-CM

## 2022-04-08 DIAGNOSIS — I4892 Unspecified atrial flutter: Secondary | ICD-10-CM

## 2022-04-08 DIAGNOSIS — I429 Cardiomyopathy, unspecified: Secondary | ICD-10-CM | POA: Diagnosis not present

## 2022-04-08 DIAGNOSIS — I4891 Unspecified atrial fibrillation: Secondary | ICD-10-CM | POA: Diagnosis not present

## 2022-04-08 DIAGNOSIS — R0602 Shortness of breath: Secondary | ICD-10-CM

## 2022-04-08 LAB — ECHOCARDIOGRAM COMPLETE
AR max vel: 2.29 cm2
AV Area VTI: 2.77 cm2
AV Area mean vel: 2.35 cm2
AV Mean grad: 3 mmHg
AV Peak grad: 5.5 mmHg
AV Vena cont: 0.4 cm
Ao pk vel: 1.17 m/s
Area-P 1/2: 2.88 cm2
P 1/2 time: 1150 msec
S' Lateral: 4.6 cm

## 2022-04-12 ENCOUNTER — Telehealth: Payer: Self-pay | Admitting: *Deleted

## 2022-04-12 NOTE — Telephone Encounter (Signed)
Left voicemail message to call back for review of results.  

## 2022-04-12 NOTE — Telephone Encounter (Signed)
-----   Message from Gerrie Nordmann, NP sent at 04/09/2022  1:18 PM EST ----- Heart function is slightly decreased. Mild leakage noted in the aortic valve with aortic valve calcification noted. Will discuss possible changes in medication at return.

## 2022-04-21 ENCOUNTER — Ambulatory Visit: Payer: Medicare PPO | Admitting: Internal Medicine

## 2022-04-22 ENCOUNTER — Other Ambulatory Visit: Payer: Self-pay | Admitting: Cardiology

## 2022-04-22 ENCOUNTER — Ambulatory Visit (INDEPENDENT_AMBULATORY_CARE_PROVIDER_SITE_OTHER): Payer: Medicare PPO

## 2022-04-22 VITALS — Ht 66.0 in | Wt 152.0 lb

## 2022-04-22 DIAGNOSIS — Z0000 Encounter for general adult medical examination without abnormal findings: Secondary | ICD-10-CM

## 2022-04-22 NOTE — Progress Notes (Signed)
Subjective:   [name] is a 76 y.o. male who presents for Medicare Annual/Subsequent preventive examination.  Review of Systems    No ROS.  Medicare Wellness Virtual Visit.  Visual/audio telehealth visit, UTA vital signs.   See social history for additional risk factors.   Cardiac Risk Factors include: advanced age (>48mn, >>27women)     Objective:    Today's Vitals   04/22/22 1443  Weight: 152 lb (68.9 kg)  Height: '5\' 6"'$  (1.676 m)   Body mass index is 24.53 kg/m.     04/22/2022    2:38 PM 01/17/2022    9:37 PM 01/17/2022    5:07 PM 01/17/2022    4:59 PM 08/22/2021    3:40 PM 07/29/2021   11:27 AM 04/21/2021    9:26 AM  Advanced Directives  Does Patient Have a Medical Advance Directive? Yes Yes  Yes Yes Yes Yes  Type of Advance Directive Living will;Healthcare Power of Attorney Living will;Healthcare Power of Attorney Living will;Healthcare Power of [place] Ac[place] Living will  Does patient want to make changes to medical advance directive? No - Patient declined No - Patient declined     No - Patient declined  Copy of [place] Chart? No - copy requested No - copy requested No - copy requested No - copy requested     Would patient like information on creating a medical advance directive?  No - Patient declined No - Patient declined No - Patient declined       Current Medications (verified) Outpatient Encounter Medications as of 04/22/2022  Medication Sig   albuterol (VENTOLIN HFA) 108 (90 Base) MCG/ACT inhaler Inhale 2 puffs into the lungs every 6 (six) hours as needed for wheezing or shortness of breath.   amiodarone (PACERONE) 200 MG tablet TAKE 1 TABLET BY MOUTH ONCE DAILY   apixaban (ELIQUIS) 5 MG TABS tablet Take 1 tablet (5 mg total) by mouth 2 (two) times daily.   Ascorbic Acid 500 MG CHEW Chew 1 tablet by mouth daily.   b  complex vitamins capsule Take 1 capsule by mouth daily.   bisoprolol (ZEBETA) 5 MG tablet Take 1 tablet (5 mg total) by mouth 2 (two) times daily.   cloNIDine (CATAPRES - DOSED IN MG/24 HR) 0.1 mg/24hr patch Place 1 patch (0.1 mg total) onto the skin every Friday.   cloNIDine (CATAPRES) 0.2 MG tablet Take 0.2 mg by mouth at bedtime.   doxazosin (CARDURA) 8 MG tablet Take 8 mg by mouth daily.   ferric citrate (AURYXIA) 1 GM 210 MG(Fe) tablet Take 1 tablet by mouth 3 (three) times daily.   fluticasone (FLONASE) 50 MCG/ACT nasal spray Place 2 sprays into both nostrils daily as needed for allergies or rhinitis.   gentamicin cream (GARAMYCIN) 0.1 % Apply 1 application topically daily.   hydrOXYzine (ATARAX) 25 MG tablet Take 25 mg by mouth 3 (three) times daily.   irbesartan (AVAPRO) 300 MG tablet Take 300 mg by mouth daily. (Patient not taking: Reported on 03/16/2022)   losartan (COZAAR) 100 MG tablet TAKE 1 TABLET BY MOUTH ONCE DAILY *DOSE INCREASE*   multivitamin (RENA-VIT) TABS tablet Take 1 tablet by mouth at bedtime.   sevelamer (RENAGEL) 800 MG tablet Take 800 mg by mouth 3 (three) times daily with meals.   torsemide (DEMADEX) 100 MG tablet Take 100 mg by mouth daily.   No facility-administered encounter  medications on file as of 04/22/2022.    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 12/2018 Echo: EF 40-45%, global HK. Asc Ao 3.7cm; b. 01/2019 Lexi MV: small, mild, fixed basal and mid antlat defect - scar vs artifact. Small, mild mid and apical inf minimally reversible defect, likely scar w/  peri-infarct ischemia. Coronary and Ao atherosclerosis; c. 12/2019 Echo: EF 40-45%, glob HK, Gr1 DD. Nl RV fxn. Sev dil LA. Mild MR. Mild-mod Ao sclerosis w/o stenosis. Asc Ao 34m.   Carotid arterial disease ([place]    a. 01/2020 RICA 1-39%, RCCA/RECA < 598% LICA 13-38% LCCA/LECA <50%.   Claudication ([place]    a. 02/2020 ABI/TBI: R 1.08/0.95, L 1.00/0.70.   Complication of anesthesia    had to be woken up slowly as his bp was elevated when did this quickly   Diabetes mellitus without complication (HCC)    ESRD (end stage renal disease) ([place]    a. Peritoneal Dialysis pt.   Hyperlipidemia    Hypertension    Migraine    cluster   Neuromuscular disorder ([place]    left lower extrem neuropathy   Obstructive sleep apnea    no OSA since had facial surgery with dr. jKathyrn Sheriffin 1997   PAD (peripheral artery disease) ([facility] Feb 2009   nonobstructing, renal angiogram ([name]   Renal cell carcinoma 2004   left kidney heminephrectomy   Renal insufficiency    Sepsis ([place] 11/06/2020   Stroke ([place]    a. 12/2019 MRI: Small acute infarcts involving the left cerebral hemisphere and several chronic infarcts.   TIA (transient ischemic attack)    no residual but left leg and foot still feel heavy   tobacco abuse    Past Surgical History:  Procedure Laterality Date   CAPD INSERTION N/A 03/01/2019   Procedure: LAPAROSCOPIC INSERTION CONTINUOUS AMBULATORY PERITONEAL DIALYSIS  (CAPD) CATHETER;  Surgeon: DAlgernon Huxley MD;  Location: [facility] ORS;  Service: Vascular;  Laterality: N/A;   CARDIAC CATHETERIZATION     Dr. AFletcher Anondid this to assess his renal artery   cyst removal  12/25/2015   Spine L4 and L5   heminephrectomy  2004   for renal cell CA   RENAL CRYOABLATION  Jan 2006   right kidney,  [name]   sciatica     Family History  Problem Relation Age of Onset   Hypertension Mother    Cancer Mother        breast   Aneurysm Mother    Coronary artery disease Father    Hypertension Father    Stroke Father  671  Heart disease Father    Heart attack Father 557  Aneurysm Maternal Grandmother        brain   Aneurysm Paternal Grandmother        brain   Coronary artery disease Paternal Grandfather    Heart disease Brother        valvular heart disease   COPD Brother    Hypertension Brother    Stroke Paternal Uncle    Social History   Socioeconomic History   Marital status: Married    Spouse name:  Sylvarena   Number of children: Not on file   Years of education: Not on file   Highest education level: Not on file  Occupational History   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 [employer]  Tobacco Use   Smoking status: Every Day    Packs/day: 1.00    Years: 50.00    Total pack years: 50.00    Types: Cigarettes   Smokeless tobacco: Never  Vaping Use   Vaping Use: Former   Devices: tried but did not like  Substance and Sexual Activity   Alcohol use: Not Currently   Drug use: No   Sexual activity: Yes  Other Topics Concern   Not on file  Social History Narrative   Not on file   Social Determinants of Health   Financial Resource Strain: Low Risk  (04/22/2022)   Overall Financial Resource Strain (CARDIA)    Difficulty of Paying Living Expenses: Not hard at all  Food Insecurity: No Food Insecurity (04/22/2022)   Hunger Vital Sign    Worried About Running Out of Food in the Last Year: Never true    [place] in the Last Year: Never true  Transportation Needs: No Transportation Needs (04/22/2022)   PRAPARE - Hydrologist (Medical): No    Lack of Transportation (Non-Medical): No  Physical Activity: Insufficiently Active (04/22/2022)   Exercise Vital Sign    Days of Exercise per Week: 3 days    Minutes of Exercise per Session: 20 min  Stress: No Stress Concern Present (04/22/2022)   [place]    Feeling of Stress : Not at all  Social Connections: Unknown (04/22/2022)   Social  Connection and Isolation Panel [NHANES]    Frequency of Communication with Friends and Family: Not on file    Frequency of Social Gatherings with Friends and Family: Not on file    Attends Religious Services: Not on file    Active Member of Clubs or Organizations: Not on file    Attends Archivist Meetings: Not on file    Marital Status: Married    Tobacco Counseling Ready to quit: Not Answered Counseling given: Not Answered   Clinical Intake:  Pre-visit preparation completed: Yes        Diabetes: Yes (Followed by pcp)  Nutrition Risk Assessment: Does the patient have any non-healing wounds?  No  Has the patient had any unintentional weight loss or weight gain?  No   Financial Strains and Diabetes Management: Are you having any financial strains with the device, your supplies or your medication? No .  Does the patient want to be seen by Chronic Care Management for management of their diabetes?  No  Would the patient like to be referred to a Nutritionist or for Diabetic Management?  No   Diabetic Exams:  Diabetic Eye Exam: Overdue for diabetic eye exam. Pt has been advised about the importance in completing this exam. Patient advised to call and schedule an eye exam. Deferred in health maintenance.  Diabetic foot exam:Overdue, Pt has been advised about the importance in completing this exam next office visit. Denies wounds and does not walk barefoot. Deferred in health maintenance.   A1c- due. Followed by pcp.   How often do you need to have someone help you when you read instructions, pamphlets, or other written materials from your doctor or pharmacy?: 1 - Never    Interpreter Needed?: No  Activities of Daily Living    04/22/2022    2:40 PM 01/17/2022    5:07 PM  In your present state of health, do you have any difficulty performing the following activities:  Hearing? 1 0  Comment Hearing aids   Vision? 0 0  Difficulty concentrating or making  decisions? 0 0  Walking or climbing stairs? 0 0  Dressing or bathing? 0 0  Doing errands, shopping? 0   Preparing Food and eating ? N   Using the Toilet? N   In the past six months, have you accidently leaked urine? N   Do you have problems with loss of bowel control? N   Managing your Medications? N   Managing your Finances? N   Housekeeping or managing your Housekeeping? N     Patient Care Team: Crecencio Mc, MD as PCP - General (Internal Medicine) End, Harrell Gave, MD as PCP - Cardiology (Cardiology) Vickie Epley, MD as PCP - Electrophysiology (Cardiology)  Indicate any recent Medical Services you may have received from other than [facility] providers in the past year (date may be approximate).     Assessment:   This is a routine wellness examination for [place].  I connected with  [name] on 04/22/22 by a audio enabled telemedicine application and verified that I am speaking with the correct person using two identifiers.  Patient Location: Home  Provider Location: Office/Clinic  I discussed the limitations of evaluation and management by telemedicine. The patient expressed understanding and agreed to proceed.   Hearing/Vision screen Hearing Screening - Comments:: Followed by [facility], bilateral   Vision Screening - Comments:: Followed by [facility]  Does not wearing glasses  Encouraged to make an eye exam  Vision screening deferred per patient request  No retinopathy reported    Dietary issues and exercise activities discussed: Current Exercise Habits: Home exercise routine Healthy diet Good water intake   Goals Addressed               This Visit's Progress     Patient Stated     I would like to walk more for exercise (pt-stated)   On track      Depression Screen    04/22/2022    2:45 PM 04/21/2021    9:24 AM 04/10/2021   12:11 PM 08/20/2020   12:05 PM 08/15/2020    1:27 PM 03/10/2020   12:49 PM 04/10/2018     8:30 AM  PHQ 2/9 Scores  PHQ - 2 Score 0 0 0 3 0 0 0  PHQ- 9 Score    10       Fall Risk    04/22/2022    2:42 PM 06/17/2021   12:52 PM 04/21/2021    9:28 AM 04/10/2021   12:11 PM 11/21/2020   10:59 AM  Fall Risk   Falls in the past year? 1 0 0 0 0  Number falls in past yr: 0 0 0 0   Injury with Fall? 1 0  0   Risk for fall due to :    Impaired balance/gait   Follow up Falls evaluation completed;Falls prevention discussed Falls evaluation completed Falls evaluation completed Falls evaluation completed;Falls prevention discussed Falls evaluation completed    FALL RISK PREVENTION PERTAINING TO THE HOME: Home free of loose throw rugs in walkways, pet beds, electrical cords, etc? Yes  Adequate lighting in your home to reduce risk of falls? Yes   ASSISTIVE DEVICES UTILIZED TO PREVENT  FALLS: Life alert? No  Use of a cane, walker or w/c? No   TIMED UP AND GO: Was the test performed? No .   Cognitive Function:        04/22/2022    3:05 PM 04/21/2021    9:47 AM 04/10/2018    8:31 AM 04/08/2017    8:53 AM 04/07/2016    8:45 AM  6CIT Screen  What Year? 0 points 0 points 0 points 0 points 0 points  What month? 0 points 0 points 0 points 0 points 0 points  What time? 0 points 0 points 0 points  0 points  Count back from 20 0 points 0 points 0 points  0 points  Months in reverse 0 points 0 points 0 points  0 points  Repeat phrase 0 points      Total Score 0 points        Immunizations Immunization History  Administered Date(s) Administered   Hepatitis B, adult 04/05/2019   Influenza-Unspecified 04/05/2019   Moderna Sars-Covid-2 Vaccination 01/21/2020, 02/14/2020   PPD Test 07/30/2020, 08/13/2020   Pneumococcal Conjugate-13 02/15/2014   Pneumococcal Polysaccharide-23 01/23/2013, 05/29/2018   Pneumococcal-Unspecified 06/18/2017   Tdap 02/03/2009, 05/29/2018, 10/01/2021   Shingrix Completed?: No.    Education has been provided regarding the importance of this vaccine.  Patient has been advised to call insurance company to determine out of pocket expense if they have not yet received this vaccine. Advised may also receive vaccine at local pharmacy or Health Dept. Verbalized acceptance and understanding.  Screening Tests Health Maintenance  Topic Date Due   FOOT EXAM  04/23/2022 (Originally 07/17/2016)   HEMOGLOBIN A1C  04/23/2022 (Originally 05/24/2021)   OPHTHALMOLOGY EXAM  04/23/2022 (Originally 09/09/2014)   Lung Cancer Screening  05/22/2022 (Originally 11/05/1995)   Zoster Vaccines- Shingrix (1 of 2) 07/22/2022 (Originally 11/04/1964)   Medicare Annual Wellness (AWV)  04/23/2023   DTaP/Tdap/Td (4 - Td or Tdap) 10/02/2031   Pneumonia Vaccine 79+ Years old  Completed   Hepatitis C Screening  Completed   HPV VACCINES  Aged Out   INFLUENZA VACCINE  Discontinued   COLONOSCOPY (Pts 45-81yr Insurance coverage will need to be confirmed)  Discontinued   COVID-19 Vaccine  Discontinued    Health Maintenance There are no preventive care reminders to display for this patient.  Lung Cancer Screening: deferred per patient preference. DG Chest View 2: completed 12/2021.    Vision Screening: Recommended annual ophthalmology exams for early detection of glaucoma and other disorders of the eye.  Dental Screening: Recommended annual dental exams for proper oral hygiene.  Community Resource Referral / Chronic Care Management: CRR required this visit?  No   CCM required this visit?  No      Plan:     I have personally reviewed and noted the following in the patient's chart:   Medical and social history Use of alcohol, tobacco or illicit drugs  Current medications and supplements including opioid prescriptions. Patient is not currently taking opioid prescriptions. Functional ability and status Nutritional status Physical activity Advanced directives List of other physicians Hospitalizations, surgeries, and ER visits in previous 12 months Vitals Screenings  to include cognitive, depression, and falls Referrals and appointments  In addition, I have reviewed and discussed with patient certain preventive protocols, quality metrics, and best practice recommendations. A written personalized care plan for preventive services as well as general preventive health recommendations were provided to patient.     DLeta Jungling LPN   [id number]

## 2022-04-22 NOTE — Patient Instructions (Addendum)
[name] , Thank you for taking time to come for your Medicare Wellness Visit. I appreciate your ongoing commitment to your health goals. Please review the following plan we discussed and let me know if I can assist you in the future.   These are the goals we discussed:  Goals       Patient Stated     I would like to walk more for exercise (pt-stated)        This is a list of the screening recommended for you and due dates:  Health Maintenance  Topic Date Due   Complete foot exam   04/23/2022*   Hemoglobin A1C  04/23/2022*   Eye exam for diabetics  04/23/2022*   Screening for Lung Cancer  05/22/2022*   Zoster (Shingles) Vaccine (1 of 2) 07/22/2022*   Medicare Annual Wellness Visit  04/23/2023   DTaP/Tdap/Td vaccine (4 - Td or Tdap) 10/02/2031   Pneumonia Vaccine  Completed   Hepatitis C Screening: USPSTF Recommendation to screen - Ages 34-79 yo.  Completed   HPV Vaccine  Aged Out   Flu Shot  Discontinued   Colon Cancer Screening  Discontinued   COVID-19 Vaccine  Discontinued  *Topic was postponed. The date shown is not the original due date.    Advanced directives: declined  Conditions/risks identified: none new  Next appointment: Follow up in one year for your annual wellness visit.   Preventive Care 59 Years and Older, Male  Preventive care refers to lifestyle choices and visits with your health care provider that can promote health and wellness. What does preventive care include? A yearly physical exam. This is also called an annual well check. Dental exams once or twice a year. Routine eye exams. Ask your health care provider how often you should have your eyes checked. Personal lifestyle choices, including: Daily care of your teeth and gums. Regular physical activity. Eating a healthy diet. Avoiding tobacco and drug use. Limiting alcohol use. Practicing safe sex. Taking low doses of aspirin every day. Taking vitamin and mineral supplements as recommended by  your health care provider. What happens during an annual well check? The services and screenings done by your health care provider during your annual well check will depend on your age, overall health, lifestyle risk factors, and family history of disease. Counseling  Your health care provider may ask you questions about your: Alcohol use. Tobacco use. Drug use. Emotional well-being. Home and relationship well-being. Sexual activity. Eating habits. History of falls. Memory and ability to understand (cognition). Work and work Statistician. Screening  You may have the following tests or measurements: Height, weight, and BMI. Blood pressure. Lipid and cholesterol levels. These may be checked every 5 years, or more frequently if you are over 65 years old. Skin check. Lung cancer screening. You may have this screening every year starting at age 65 if you have a 30-pack-year history of smoking and currently smoke or have quit within the past 15 years. Fecal occult blood test (FOBT) of the stool. You may have this test every year starting at age 53. Flexible sigmoidoscopy or colonoscopy. You may have a sigmoidoscopy every 5 years or a colonoscopy every 10 years starting at age 28. Prostate cancer screening. Recommendations will vary depending on your family history and other risks. Hepatitis C blood test. Hepatitis B blood test. Sexually transmitted disease (STD) testing. Diabetes screening. This is done by checking your blood sugar (glucose) after you have not eaten for a while (fasting). You may  have this done every 1-3 years. Abdominal aortic aneurysm (AAA) screening. You may need this if you are a current or former smoker. Osteoporosis. You may be screened starting at age 87 if you are at high risk. Talk with your health care provider about your test results, treatment options, and if necessary, the need for more tests. Vaccines  Your health care provider may recommend certain vaccines,  such as: Influenza vaccine. This is recommended every year. Tetanus, diphtheria, and acellular pertussis (Tdap, Td) vaccine. You may need a Td booster every 10 years. Zoster vaccine. You may need this after age 63. Pneumococcal 13-valent conjugate (PCV13) vaccine. One dose is recommended after age 78. Pneumococcal polysaccharide (PPSV23) vaccine. One dose is recommended after age 24. Talk to your health care provider about which screenings and vaccines you need and how often you need them. This information is not intended to replace advice given to you by your health care provider. Make sure you discuss any questions you have with your health care provider. Document Released: 06/06/2015 Document Revised: 01/28/2016 Document Reviewed: 03/11/2015 Elsevier Interactive Patient Education  2017 [place] Prevention in the Home Falls can cause injuries. They can happen to people of all ages. There are many things you can do to make your home safe and to help prevent falls. What can I do on the outside of my home? Regularly fix the edges of walkways and driveways and fix any cracks. Remove anything that might make you trip as you walk through a door, such as a raised step or threshold. Trim any bushes or trees on the path to your home. Use bright outdoor lighting. Clear any walking paths of anything that might make someone trip, such as rocks or tools. Regularly check to see if handrails are loose or broken. Make sure that both sides of any steps have handrails. Any raised decks and porches should have guardrails on the edges. Have any leaves, snow, or ice cleared regularly. Use sand or salt on walking paths during winter. Clean up any spills in your garage right away. This includes oil or grease spills. What can I do in the bathroom? Use night lights. Install grab bars by the toilet and in the tub and shower. Do not use towel bars as grab bars. Use non-skid mats or decals in the tub or  shower. If you need to sit down in the shower, use a plastic, non-slip stool. Keep the floor dry. Clean up any water that spills on the floor as soon as it happens. Remove soap buildup in the tub or shower regularly. Attach bath mats securely with double-sided non-slip rug tape. Do not have throw rugs and other things on the floor that can make you trip. What can I do in the bedroom? Use night lights. Make sure that you have a light by your bed that is easy to reach. Do not use any sheets or blankets that are too big for your bed. They should not hang down onto the floor. Have a firm chair that has side arms. You can use this for support while you get dressed. Do not have throw rugs and other things on the floor that can make you trip. What can I do in the kitchen? Clean up any spills right away. Avoid walking on wet floors. Keep items that you use a lot in easy-to-reach places. If you need to reach something above you, use a strong step stool that has a grab bar. Keep electrical cords  out of the way. Do not use floor polish or wax that makes floors slippery. If you must use wax, use non-skid floor wax. Do not have throw rugs and other things on the floor that can make you trip. What can I do with my stairs? Do not leave any items on the stairs. Make sure that there are handrails on both sides of the stairs and use them. Fix handrails that are broken or loose. Make sure that handrails are as long as the stairways. Check any carpeting to make sure that it is firmly attached to the stairs. Fix any carpet that is loose or worn. Avoid having throw rugs at the top or bottom of the stairs. If you do have throw rugs, attach them to the floor with carpet tape. Make sure that you have a light switch at the top of the stairs and the bottom of the stairs. If you do not have them, ask someone to add them for you. What else can I do to help prevent falls? Wear shoes that: Do not have high heels. Have  rubber bottoms. Are comfortable and fit you well. Are closed at the toe. Do not wear sandals. If you use a stepladder: Make sure that it is fully opened. Do not climb a closed stepladder. Make sure that both sides of the stepladder are locked into place. Ask someone to hold it for you, if possible. Clearly mark and make sure that you can see: Any grab bars or handrails. First and last steps. Where the edge of each step is. Use tools that help you move around (mobility aids) if they are needed. These include: Canes. Walkers. Scooters. Crutches. Turn on the lights when you go into a dark area. Replace any light bulbs as soon as they burn out. Set up your furniture so you have a clear path. Avoid moving your furniture around. If any of your floors are uneven, fix them. If there are any pets around you, be aware of where they are. Review your medicines with your doctor. Some medicines can make you feel dizzy. This can increase your chance of falling. Ask your doctor what other things that you can do to help prevent falls. This information is not intended to replace advice given to you by your health care provider. Make sure you discuss any questions you have with your health care provider. Document Released: 03/06/2009 Document Revised: 10/16/2015 Document Reviewed: 06/14/2014 Elsevier Interactive Patient Education  2017 [employer].

## 2022-04-22 NOTE — Telephone Encounter (Signed)
Pt's pharmacy is requesting a refill on albuterol inhaler. Would Dr. Quentin Ore like to refill this medication? Please address

## 2022-04-27 ENCOUNTER — Other Ambulatory Visit: Payer: Self-pay | Admitting: Cardiology

## 2022-05-04 ENCOUNTER — Ambulatory Visit: Payer: Medicare PPO | Attending: Internal Medicine | Admitting: Cardiology

## 2022-05-04 ENCOUNTER — Encounter: Payer: Self-pay | Admitting: Cardiology

## 2022-05-04 ENCOUNTER — Other Ambulatory Visit
Admission: RE | Admit: 2022-05-04 | Discharge: 2022-05-04 | Disposition: A | Discharge status: Home / Self Care | Payer: Medicare PPO | Source: Ambulatory Visit | Attending: Internal Medicine | Admitting: Internal Medicine

## 2022-05-04 VITALS — BP 130/58 | HR 55 | Ht 66.0 in | Wt 150.4 lb

## 2022-05-04 DIAGNOSIS — I5022 Chronic systolic (congestive) heart failure: Secondary | ICD-10-CM | POA: Insufficient documentation

## 2022-05-04 DIAGNOSIS — N186 End stage renal disease: Secondary | ICD-10-CM | POA: Diagnosis not present

## 2022-05-04 DIAGNOSIS — R0602 Shortness of breath: Secondary | ICD-10-CM | POA: Diagnosis not present

## 2022-05-04 DIAGNOSIS — I4891 Unspecified atrial fibrillation: Secondary | ICD-10-CM

## 2022-05-04 DIAGNOSIS — I4892 Unspecified atrial flutter: Secondary | ICD-10-CM

## 2022-05-04 DIAGNOSIS — I1A0 Resistant hypertension: Secondary | ICD-10-CM

## 2022-05-04 LAB — BRAIN NATRIURETIC PEPTIDE: B Natriuretic Peptide: 3565.1 pg/mL — ABNORMAL HIGH (ref 0.0–100.0)

## 2022-05-04 MED ORDER — ALBUTEROL SULFATE HFA 108 (90 BASE) MCG/ACT IN AERS: 2.0000 | INHALATION_SPRAY | Freq: Four times a day (QID) | RESPIRATORY_TRACT | 11 refills | Status: DC | PRN

## 2022-05-04 NOTE — Patient Instructions (Signed)
Medication Instructions:   Your physician recommends that you continue on your current medications as directed. Please refer to the Current Medication list given to you today.  *If you need a refill on your cardiac medications before your next appointment, please call your pharmacy*   Lab Work:  Your provider recommends you go to the medical mall to have lab work completed - BNP  If you have labs (blood work) drawn today and your tests are completely normal, you will receive your results only by: [place] (if you have MyChart) OR A paper copy in the mail If you have any lab test that is abnormal or we need to change your treatment, we will call you to review the results.   Testing/Procedures:  None Ordered   Follow-Up: At [facility], you and your health needs are our priority.  As part of our continuing mission to provide you with exceptional heart care, we have created designated Provider Care Teams.  These Care Teams include your primary Cardiologist (physician) and Advanced Practice Providers (APPs -  Physician Assistants and Nurse Practitioners) who all work together to provide you with the care you need, when you need it.  We recommend signing up for the patient portal called "MyChart".  Sign up information is provided on this After Visit Summary.  MyChart is used to connect with patients for Virtual Visits (Telemedicine).  Patients are able to view lab/test results, encounter notes, upcoming appointments, etc.  Non-urgent messages can be sent to your provider as well.   To learn more about what you can do with MyChart, go to [email or web address].    Your next appointment:   6 week(s)  The format for your next appointment:   In Person  Provider:   [name], MD

## 2022-05-04 NOTE — Progress Notes (Unsigned)
Cardiology Clinic Note   Patient Name: INOCENTE KRACH Date of Encounter: 05/05/2022  Primary Care Provider:  Crecencio Mc, MD Primary Cardiologist:  [name], MD  Patient Profile    76 year old male with a history of end-stage renal disease currently on peritoneal dialysis, cardiomegaly with an EF of 40-45%, paroxysmal atrial fibrillation/atrial flutter, essential hypertension, hyperlipidemia, peripheral arterial disease with chronic dissection of the left subclavian artery followed by vascular surgery, sleep apnea, renal cell carcinoma status post left hemic nephrectomy, tobacco abuse, CVA, and adenocarcinoma of the appendix, who is here today for follow-up.  Past Medical History    Past Medical History:  Diagnosis Date   Acute on chronic systolic congestive heart failure [facility])    Adenocarcinoma of appendix [facility]) Jan 2006   right kidney, s/p cryoablation   Atrial fibrillation with rapid ventricular response ([place]) 05/12/2020   Cardiomyopathy ([place])    a. 12/2018 Echo: EF 40-45%, global HK. Asc Ao 3.7cm; b. 01/2019 Lexi MV: small, mild, fixed basal and mid antlat defect - scar vs artifact. Small, mild mid and apical inf minimally reversible defect, likely scar w/ peri-infarct ischemia. Coronary and Ao atherosclerosis; c. 12/2019 Echo: EF 40-45%, glob HK, Gr1 DD. Nl RV fxn. Sev dil LA. Mild MR. Mild-mod Ao sclerosis w/o stenosis. Asc Ao 74m.   Carotid arterial disease ([facility]    a. 01/2020 RICA 1-39%, RCCA/RECA < 598% LICA 19-21% LCCA/LECA <50%.   Claudication ([place]    a. 02/2020 ABI/TBI: R 1.08/0.95, L 1.00/0.70.   Complication of anesthesia    had to be woken up slowly as his bp was elevated when did this quickly   Diabetes mellitus without complication (HCC)    ES[facility] (end stage renal disease) ([facility]    a. Peritoneal Dialysis pt.   Hyperlipidemia    Hypertension    Migraine    cluster   Neuromuscular disorder ([facility]    left lower extrem neuropathy   Obstructive sleep  apnea    no OSA since had facial surgery with dr. jKathyrn Sheriffin 1997   PAD (peripheral artery disease) ([facility] Feb 2009   nonobstructing, renal angiogram ([name]   Renal cell carcinoma 2004   left kidney heminephrectomy   Renal insufficiency    Sepsis ([place] 11/06/2020   Stroke ([facility]    a. 12/2019 MRI: Small acute infarcts involving the left cerebral hemisphere and several chronic infarcts.   TIA (transient ischemic attack)    no residual but left leg and foot still feel heavy   tobacco abuse    Past Surgical History:  Procedure Laterality Date   CAPD INSERTION N/A 03/01/2019   Procedure: LAPAROSCOPIC INSERTION CONTINUOUS AMBULATORY PERITONEAL DIALYSIS  (CAPD) CATHETER;  Surgeon: DAlgernon Huxley MD;  Location: [facility] ORS;  Service: Vascular;  Laterality: N/A;   CARDIAC CATHETERIZATION     Dr. AFletcher Anondid this to assess his renal artery   cyst removal  12/25/2015   Spine L4 and L5   heminephrectomy  2004   for renal cell CA   RENAL CRYOABLATION  Jan 2006   right kidney,  [name]   sciatica      Allergies  Allergies  Allergen Reactions   Irbesartan Other (See Comments)    hyperkalemia   Cymbalta [Duloxetine Hcl]    Hydralazine Other (See Comments)    headache   Imdur [Isosorbide Nitrate] Other (See Comments)    headache   Rosuvastatin     Breast swelling/soreness   Atorvastatin Other (See Comments)  Muscle pain   Bystolic [Nebivolol Hcl]     Extreme fatigue     History of Present Illness    Jabree Rebert is a 76 year old male with a complex medical history of end-stage renal disease currently on.  [place] dialysis, cardiomyopathy with EF 40-45%, paroxysmal atrial fibrillation/flutter, essential hypertension, hyperlipidemia, peripheral arterial disease with chronic dissection of the left subclavian artery followed by vascular surgery,, renal cell carcinoma status post he may nephrectomy, tobacco use, CVA, adenocarcinoma of the appendix.  His cardiac history is significant for  shortness The proximal also given artery which was determined to be nonsurgical per vascular August 2021 where he presented to the hospital with lack of coordination and multiple small infarcts involving the left cerebral hemisphere.  Subsequent event monitoring did not show atrial fibrillation.  In follow-up he was started on isosorbide mononitrate for blood pressure control and antianginal with worsening DOE and but was stopped due to headaches.  He was also intolerant of hydralazine due to headache.  In December 2021 he was hospitalized for acute on chronic HFrEF with hypertensive urgency.  There were questions of PAF when EMS first arrived on the scene that cannot be confirmed by any tracings.  At that time the decision to place him on anticoagulation was deferred.  He was last seen in clinic on 03/16/2020 stating that overall he felt well.  He continues to have some dyspnea on exertion but denied any chest pain.  He is continued on peritoneal dialysis with a new machine and not had any concerns of inconsistent dialysis draining and feeling as he previously did on his hospitalization.  He was scheduled for repeat echocardiogram.  Echocardiogram was completed 04/08/2022 which revealed an LVEF of 30-35%, moderate left ventricular hypertrophy, G1 DD, moderately dilated left atrium, and aortic valve regurgitation was mild.  He returns to clinic today with complaints of fatigue and exertional dyspnea.  He has been compliant with dialysis without missing any rounds.  Stated that he did follow-up at the dialysis clinic when he started with exertional dyspnea and was evaluated without any changes made to current therapy or medication regimen.  He had noted several bouts of elevated heart rate when his shortness of breath started approximately 2 to 3 weeks prior.  He thought that he was back in atrial fibrillation from the palpitations and elevated heart rate so he had doubled his amiodarone dosing for several  days and then went back to his current dosing.  He was noted to have elevated blood pressure slightly at dialysis and was previously prescribed 2.5 mg of minoxidil which he has yet to start.  He is concerned with the problems that he had with the Imdur that with this medication being a vasodilator that he would continue with the headaches that he had had previously.  He denies any current chest pain, palpitations, or weight gain.  He also continues to deny peripheral edema. He recently had a CT  of the chest and abd that was completed for cancer follow-up which showed that he did have a small pleural effusion on the left.  Home Medications    Current Outpatient Medications  Medication Sig Dispense Refill   amiodarone (PACERONE) 200 MG tablet TAKE 1 TABLET BY MOUTH ONCE DAILY 90 tablet 0   apixaban (ELIQUIS) 5 MG TABS tablet Take 1 tablet (5 mg total) by mouth 2 (two) times daily. 60 tablet 11   Ascorbic Acid 500 MG CHEW Chew 1 tablet by mouth daily.  b complex vitamins capsule Take 1 capsule by mouth daily.     bisoprolol (ZEBETA) 5 MG tablet Take 1 tablet (5 mg total) by mouth 2 (two) times daily. 60 tablet 0   cloNIDine (CATAPRES - DOSED IN MG/24 HR) 0.1 mg/24hr patch Place 1 patch (0.1 mg total) onto the skin every Friday. 12 patch 3   cloNIDine (CATAPRES) 0.2 MG tablet Take 0.2 mg by mouth at bedtime.     doxazosin (CARDURA) 8 MG tablet Take 8 mg by mouth daily.     ferric citrate (AURYXIA) 1 GM 210 MG(Fe) tablet Take 1 tablet by mouth 3 (three) times daily.     fluticasone (FLONASE) 50 MCG/ACT nasal spray Place 2 sprays into both nostrils daily as needed for allergies or rhinitis.     gentamicin cream (GARAMYCIN) 0.1 % Apply 1 application topically daily. 15 g 0   hydrOXYzine (ATARAX) 25 MG tablet Take 25 mg by mouth 3 (three) times daily.     irbesartan (AVAPRO) 300 MG tablet Take 300 mg by mouth daily.     losartan (COZAAR) 100 MG tablet TAKE 1 TABLET BY MOUTH ONCE DAILY *DOSE INCREASE*  90 tablet 0   multivitamin (RENA-VIT) TABS tablet Take 1 tablet by mouth at bedtime.     sevelamer (RENAGEL) 800 MG tablet Take 800 mg by mouth 3 (three) times daily with meals.     torsemide (DEMADEX) 100 MG tablet Take 100 mg by mouth daily.     triamcinolone cream (KENALOG) 0.1 % Apply 1 Application topically as needed (itching).     albuterol (VENTOLIN HFA) 108 (90 Base) MCG/ACT inhaler Inhale 2 puffs into the lungs every 6 (six) hours as needed for wheezing or shortness of breath. 8 g 11   No current facility-administered medications for this visit.     Family History    Family History  Problem Relation Age of Onset   Hypertension Mother    Cancer Mother        breast   Aneurysm Mother    Coronary artery disease Father    Hypertension Father    Stroke Father 28   Heart disease Father    Heart attack Father 43   Aneurysm Maternal Grandmother        brain   Aneurysm Paternal Grandmother        brain   Coronary artery disease Paternal Grandfather    Heart disease Brother        valvular heart disease   COPD Brother    Hypertension Brother    Stroke Paternal Uncle    He indicated that his mother is deceased. He indicated that his father is deceased. He indicated that his brother is deceased. He indicated that his maternal grandmother is deceased. He indicated that his maternal grandfather is deceased. He indicated that the status of his paternal grandmother is unknown. He indicated that the status of his paternal grandfather is unknown. He indicated that his paternal uncle is deceased.  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 [place]  Tobacco Use   Smoking status: Every Day    Packs/day: 1.00    Years: 50.00    Total pack years:  50.00    Types:  Cigarettes   Smokeless tobacco: Never  Vaping Use   Vaping Use: Former   Devices: tried but did not like  Substance and Sexual Activity   Alcohol use: Not Currently   Drug use: No   Sexual activity: Yes  Other Topics Concern   Not on file  Social History Narrative   Not on file   Social Determinants of Health   Financial Resource Strain: Low Risk  (04/22/2022)   Overall Financial Resource Strain (CARDIA)    Difficulty of Paying Living Expenses: Not hard at all  Food Insecurity: No Food Insecurity (04/22/2022)   Hunger Vital Sign    Worried About Running Out of Food in the Last Year: Never true    Ran Out of Food in the Last Year: Never true  Transportation Needs: No Transportation Needs (04/22/2022)   PRAPARE - Hydrologist (Medical): No    Lack of Transportation (Non-Medical): No  Physical Activity: Insufficiently Active (04/22/2022)   Exercise Vital Sign    Days of Exercise per Week: 3 days    Minutes of Exercise per Session: 20 min  Stress: No Stress Concern Present (04/22/2022)   [place]    Feeling of Stress : Not at all  Social Connections: Unknown (04/22/2022)   Social Connection and Isolation Panel [NHANES]    Frequency of Communication with Friends and Family: Not on file    Frequency of Social Gatherings with Friends and Family: Not on file    Attends Religious Services: Not on file    Active Member of Clubs or Organizations: Not on file    Attends Archivist Meetings: Not on file    Marital Status: Married  Intimate Partner Violence: Not At Risk (04/22/2022)   Humiliation, Afraid, Rape, and Kick questionnaire    Fear of Current or Ex-Partner: No    Emotionally Abused: No    Physically Abused: No    Sexually Abused: No     Review of Systems    General:  No chills, fever, night sweats or weight changes. Endorses  fatigue Cardiovascular:  No chest pain, endorses dyspnea on exertion, edema, but denies orthopnea, endorses palpitations, and denies paroxysmal nocturnal dyspnea. Dermatological: No rash, lesions/masses, endorses itching and dry skin Respiratory: Endorses cough and dyspnea Urologic: No hematuria, dysuria Abdominal:   No nausea, vomiting, diarrhea, bright red blood per rectum, melena, or hematemesis Neurologic:  No visual changes, wkns, changes in mental status. All other systems reviewed and are otherwise negative except as noted above.   Physical Exam    VS:  BP (!) 130/58 (BP Location: Left Arm, Patient Position: Standing, Cuff Size: Normal)   Pulse (!) 55   Ht '5\' 6"'$  (1.676 m)   Wt 150 lb 6.4 oz (68.2 kg)   SpO2 95%   BMI 24.28 kg/m  , BMI Body mass index is 24.28 kg/m.     GEN: Well nourished, well developed, in no acute distress. HEENT: normal. Neck: Supple, no JVD, carotid bruits, or masses. Cardiac: RRR, bradycardiac, no murmurs, rubs, or gallops. No clubbing, cyanosis, edema.  Radials 2+/PT 2+ and equal bilaterally.  Respiratory:  Respirations regular and unlabored, coarse with scattered rhonchi noted on the left to auscultation bilaterally. GI: Soft, nontender, nondistended, BS + x 4. PD catheter intact and locked. MS: no deformity or atrophy. Skin: warm and dry, no rash, dry and continues to scratch often Neuro:  Strength and sensation are intact. Psych:  Normal affect.  Accessory Clinical Findings    ECG personally reviewed by me today-sinus bradycardia with a rate of 55, first-degree AV block, RSR prime in V1 and V2, biventricular hypertrophy- No acute changes  Lab Results  Component Value Date   WBC 7.7 01/18/2022   HGB 8.5 (L) 01/18/2022   HCT 26.8 (L) 01/18/2022   MCV 92.1 01/18/2022   PLT 148 (L) 01/18/2022   Lab Results  Component Value Date   CREATININE 8.45 (H) 01/18/2022   BUN 54 (H) 01/18/2022   NA 141 01/18/2022   K 4.9 01/18/2022   CL 108  01/18/2022   CO2 23 01/18/2022   Lab Results  Component Value Date   ALT 9 09/09/2021   AST 7 09/09/2021   ALKPHOS 128 (H) 09/09/2021   BILITOT <0.2 09/09/2021   Lab Results  Component Value Date   CHOL 122 05/12/2020   HDL 25 (L) 05/12/2020   LDLCALC 61 05/12/2020   LDLDIRECT 134.0 05/11/2017   TRIG 180 (H) 05/12/2020   CHOLHDL 4.9 05/12/2020    Lab Results  Component Value Date   HGBA1C 6.9 (H) 11/21/2020    Assessment & Plan   1.  Chronic HFrEF with recent echocardiogram revealing LVEF 30-35% which is a drop from his previous echocardiogram.  He continues to have exertional dyspnea but denies any current chest pain.  Systems are consistent with NYHA class II-III heart failure.  He continues to to appear euvolemic on exam today.  Also rediscussed R/LHC with patient again today to get a better understanding of hemodynamics and if there is any significant blockage contributing to his drop in heart function.  Patient states that he would like to continue sitter and continue to think about this we did discuss that the left heart catheterization does not tell a small amount of dye which may inevitably change him from peritoneal dialysis to hemodialysis but there is no dye used with a right heart catheterization.  We also discussed that they would use milrinone possible during the procedure to try and prevent that from happening but he had to understand that it was a possibility.  He is continued on bisoprolol 5 mg twice daily, losartan 100 mg daily, and torsemide 100 mg daily.  The continuation and escalation of GDMT has been limited due to his kidney function and peritoneal dialysis.  With his worsening dyspnea today he has been sent for a follow-up BNP.  He is also requesting a refill on his albuterol inhaler that is being sent to pharmacy of choice.  2.  Resistant hypertension with blood pressure today 130/58.  He has been continued on bisoprolol 5 mg twice daily, clonidine patch 0.1 mg  weekly, clonidine 0.2 mg at bedtime, doxazosin 8 mg daily, losartan 100 mg daily, and torsemide 100 mg daily.  He has also been encouraged to continue to monitor his pressures at home as he does suffer from severe orthostasis from time to time.  3.  Paroxysmal atrial fibrillation/atrial flutter she remains sinus bradycardia on EKG today.  He did increase the dose of his amiodarone as he felt he was back in atrial fibrillation several days prior but is now back on his dosing of amiodarone 200 mg daily.  He has also been continued on his apixaban for CHA2DS2-VASc score of at least 4 and remains on bisoprolol 5 mg twice daily for rate control.  4.  End-stage renal disease continued on peritoneal dialysis and managed by nephrology.  5.  Peripheral arterial disease  with chronic dissection of the left subclavian artery that continues to be followed by [facility].  6.  Disposition patient return to clinic to see MD/APP in 6 weeks or sooner if needed to rediscuss R/LHC.  Gladys Gutman, NP 05/05/2022, 12:02 PM

## 2022-05-05 NOTE — Progress Notes (Signed)
Remains elevated like previous hospital admission. Recommend to check with nephrology to see if increasing torsemide may help for a few days remove some of the excess fluid since you are short of breath and have extra fluid noted on your CT scan on your left lung.

## 2022-05-07 NOTE — Telephone Encounter (Signed)
Reviewed results and recommendations with patient. Patient states he has a appointment with them next week and will make sure to review with time. He verbalized understanding with no further questions at this time.

## 2022-05-07 NOTE — Progress Notes (Signed)
Reviewed at appointment.

## 2022-05-07 NOTE — Telephone Encounter (Signed)
-----   Message from Gerrie Nordmann, NP sent at 05/05/2022 12:22 PM EST ----- Remains elevated like previous hospital admission. Recommend to check with nephrology to see if increasing torsemide may help for a few days remove some of the excess fluid since you are short of breath and have extra fluid noted on your CT scan on y our left lung.

## 2022-05-13 ENCOUNTER — Other Ambulatory Visit: Payer: Self-pay | Admitting: Cardiology

## 2022-05-13 DIAGNOSIS — I4891 Unspecified atrial fibrillation: Secondary | ICD-10-CM

## 2022-05-13 NOTE — Telephone Encounter (Signed)
Prescription refill request for Eliquis received. Indication: Afib  Last office visit:05/04/22 ([place]) Scr: 8.45 (01/18/22)  Age: 76 Weight: 68.2kg  Appropriate dose and refill sent to requested pharmacy

## 2022-05-18 ENCOUNTER — Other Ambulatory Visit: Payer: Self-pay | Admitting: Cardiology

## 2022-05-26 ENCOUNTER — Ambulatory Visit: Payer: Medicare PPO | Admitting: Dermatology

## 2022-05-26 ENCOUNTER — Encounter: Payer: Self-pay | Admitting: Dermatology

## 2022-05-26 VITALS — BP 160/74 | HR 63

## 2022-05-26 DIAGNOSIS — L814 Other melanin hyperpigmentation: Secondary | ICD-10-CM

## 2022-05-26 DIAGNOSIS — L578 Other skin changes due to chronic exposure to nonionizing radiation: Secondary | ICD-10-CM

## 2022-05-26 DIAGNOSIS — Z79899 Other long term (current) drug therapy: Secondary | ICD-10-CM

## 2022-05-26 DIAGNOSIS — L820 Inflamed seborrheic keratosis: Secondary | ICD-10-CM

## 2022-05-26 DIAGNOSIS — L209 Atopic dermatitis, unspecified: Secondary | ICD-10-CM

## 2022-05-26 DIAGNOSIS — B353 Tinea pedis: Secondary | ICD-10-CM

## 2022-05-26 DIAGNOSIS — Z1283 Encounter for screening for malignant neoplasm of skin: Secondary | ICD-10-CM

## 2022-05-26 DIAGNOSIS — L821 Other seborrheic keratosis: Secondary | ICD-10-CM

## 2022-05-26 DIAGNOSIS — R21 Rash and other nonspecific skin eruption: Secondary | ICD-10-CM | POA: Diagnosis not present

## 2022-05-26 DIAGNOSIS — Z86018 Personal history of other benign neoplasm: Secondary | ICD-10-CM

## 2022-05-26 DIAGNOSIS — D692 Other nonthrombocytopenic purpura: Secondary | ICD-10-CM

## 2022-05-26 MED ORDER — TACROLIMUS 0.1 % EX OINT: TOPICAL_OINTMENT | Freq: Two times a day (BID) | CUTANEOUS | 3 refills | Status: DC

## 2022-05-26 MED ORDER — KETOCONAZOLE 2 % EX CREA: 1.0000 | TOPICAL_CREAM | Freq: Every day | CUTANEOUS | 6 refills | Status: DC

## 2022-05-26 NOTE — Progress Notes (Signed)
New Patient Visit  Subjective  [name] is a 77 y.o. male who presents for the following: Total body skin exam (Hx of Skin cancer R dorsum hand txted yrs ago, hx of Dysplastic Nevus L upper back) and Pruritis (Back, arms, 53yr, pt has used Hydroxyzine 25mg  and TMC 0.1% cr in past with little improvement). The patient presents for Total-Body Skin Exam (TBSE) for skin cancer screening and mole check.  The patient has spots, moles and lesions to be evaluated, some may be new or changing and the patient has concerns that these could be cancer.  The following portions of the chart were reviewed this encounter and updated as appropriate:   Tobacco  Allergies  Meds  Problems  Med Hx  Surg Hx  Fam Hx     Review of Systems:  No other skin or systemic complaints except as noted in HPI or Assessment and Plan.  Objective  Well appearing patient in no apparent distress; mood and affect are within normal limits.  A full examination was performed including scalp, head, eyes, ears, nose, lips, neck, chest, axillae, abdomen, back, buttocks, bilateral upper extremities, bilateral lower extremities, hands, feet, fingers, toes, fingernails, and toenails. All findings within normal limits unless otherwise noted below.  back, arms Generalized scaling lichenification trunk, arms, legs                     abdomen x 2, scalp x 2 (4) Stuck on waxy paps with erythema  bil feet Scaling bil feet   Assessment & Plan  Lentigines - Scattered tan macules - Due to sun exposure - Benign-appearing, observe - Recommend daily broad spectrum sunscreen SPF 30+ to sun-exposed areas, reapply every 2 hours as needed. - Call for any changes  Seborrheic Keratoses - Stuck-on, waxy, tan-brown papules and/or plaques  - Benign-appearing - Discussed benign etiology and prognosis. - Observe - Call for any changes  Melanocytic Nevi - Tan-brown and/or pink-flesh-colored symmetric macules and  papules - Benign appearing on exam today - Observation - Call clinic for new or changing moles - Recommend daily use of broad spectrum spf 30+ sunscreen to sun-exposed areas.   Hemangiomas - Red papules - Discussed benign nature - Observe - Call for any changes  Actinic Damage - Chronic condition, secondary to cumulative UV/sun exposure - diffuse scaly erythematous macules with underlying dyspigmentation - Recommend daily broad spectrum sunscreen SPF 30+ to sun-exposed areas, reapply every 2 hours as needed.  - Staying in the shade or wearing long sleeves, sun glasses (UVA+UVB protection) and wide brim hats (4-inch brim around the entire circumference of the hat) are also recommended for sun protection.  - Call for new or changing lesions.  Skin cancer screening performed today.   History of Skin Cancer  Clear. Observe for recurrence.  Call clinic for new or changing lesions.   Recommend regular skin exams, daily broad-spectrum spf 30+ sunscreen use, and photoprotection.     - R dorsum hand txted in past  History of Dysplastic Nevi - No evidence of recurrence today - Recommend regular full body skin exams - Recommend daily broad spectrum sunscreen SPF 30+ to sun-exposed areas, reapply every 2 hours as needed.  - Call if any new or changing lesions are noted between office visits  - L upper back  Rash back, arms - all over skin See photos Atopic Dermatitis with severe generalized xerosis Exacerbated by Pruritus of Renal Disease - pt On Peritoneal dialysis.  Start Protopic  0.1% oint bid to aa itchy rash back, arms  Per Dr. Mosetta Torres - pts Nephrologist -  in staff message, patient can start Dupixent.  Since pt has tried topical steroid and will have tried tacrolimus if still symptomatic on f/u, will start patient on Dupixent at follow up appt.   Brett Pigeon, MD  [name], CMA Cc: Brett Evener, MD No dose adjustment needed. He can have it       Previous  Messages    ----- Message ----- From: [name], CMA Sent: 05/26/2022   4:25 PM EST To: Brett Pigeon, MD; Brett Evener, MD  Hi Dr. Thedore Mins,  Dr. Gwen Torres would like to start above patient on Dupixent injections for atopic Dermatitis with pruritus and xerosis that is exacerbated by peritoneal dialysis.  Can you advise if he can start Dupixent with his kidney disease.  Thank you,    [name], RMA [facility] [phone] ext [phone]  tacrolimus (PROTOPIC) 0.1 % ointment - back, arms Apply topically 2 (two) times daily. Bid to itchy rash on body  Inflamed seborrheic keratosis (4) abdomen x 2, scalp x 2 Symptomatic, irritating, patient would like treated. Destruction of lesion - abdomen x 2, scalp x 2 Complexity: simple   Destruction method: cryotherapy   Informed consent: discussed and consent obtained   Timeout:  patient name, date of birth, surgical site, and procedure verified Lesion destroyed using liquid nitrogen: Yes   Region frozen until ice ball extended beyond lesion: Yes   Outcome: patient tolerated procedure well with no complications   Post-procedure details: wound care instructions given    Tinea pedis of both feet bil feet Chronic and persistent condition with duration or expected duration over one year. Condition is symptomatic / bothersome to patient. Not to goal. Start Ketoconazole 2% qhs to feet  ketoconazole (NIZORAL) 2 % cream - bil feet Apply 1 Application topically at bedtime. Qhs to feet for scaly rash on feet  Purpura - Chronic; persistent and recurrent.  Treatable, but not curable. - Violaceous macules and patches - Benign - Related to trauma, age, sun damage and/or use of blood thinners, chronic use of topical and/or oral steroids - Observe - Can use OTC arnica containing moisturizer such as Dermend Bruise Formula if desired - Call for worsening or other concerns   Return in about 6 weeks (around 07/07/2022) for Rash f/u.  I,  [name], RMA, am acting as scribe for Brett Sans, MD . Documentation: I have reviewed the above documentation for accuracy and completeness, and I agree with the above.  Brett Sans, MD

## 2022-05-26 NOTE — Patient Instructions (Addendum)
Cryotherapy Aftercare  Wash gently with soap and water everyday.   Apply Vaseline and Band-Aid daily until healed.     Due to recent changes in healthcare laws, you may see results of your pathology and/or laboratory studies on MyChart before the doctors have had a chance to review them. We understand that in some cases there may be results that are confusing or concerning to you. Please understand that not all results are received at the same time and often the doctors may need to interpret multiple results in order to provide you with the best plan of care or course of treatment. Therefore, we ask that you please give us 2 business days to thoroughly review all your results before contacting the office for clarification. Should we see a critical lab result, you will be contacted sooner.   If You Need Anything After Your Visit  If you have any questions or concerns for your doctor, please call our main line at [phone] and press option 4 to reach your doctor's medical assistant. If no one answers, please leave a voicemail as directed and we will return your call as soon as possible. Messages left after 4 pm will be answered the following business day.   You may also send us a message via MyChart. We typically respond to MyChart messages within 1-2 business days.  For prescription refills, please ask your pharmacy to contact our office. Our fax number is [phone].  If you have an urgent issue when the clinic is closed that cannot wait until the next business day, you can page your doctor at the number below.    Please note that while we do our best to be available for urgent issues outside of office hours, we are not available 24/7.   If you have an urgent issue and are unable to reach us, you may choose to seek medical care at your doctor's office, retail clinic, urgent care center, or emergency room.  If you have a medical emergency, please immediately call 911 or go to the  emergency department.  Pager Numbers  - Dr. Kowalski: [phone]  - Dr. Moye: [phone]  - Dr. Stewart: [phone]  In the event of inclement weather, please call our main line at [phone] for an update on the status of any delays or closures.  Dermatology Medication Tips: Please keep the boxes that topical medications come in in order to help keep track of the instructions about where and how to use these. Pharmacies typically print the medication instructions only on the boxes and not directly on the medication tubes.   If your medication is too expensive, please contact our office at [phone] option 4 or send us a message through MyChart.   We are unable to tell what your co-pay for medications will be in advance as this is different depending on your insurance coverage. However, we may be able to find a substitute medication at lower cost or fill out paperwork to get insurance to cover a needed medication.   If a prior authorization is required to get your medication covered by your insurance company, please allow us 1-2 business days to complete this process.  Drug prices often vary depending on where the prescription is filled and some pharmacies may offer cheaper prices.  The website www.goodrx.com contains coupons for medications through different pharmacies. The prices here do not account for what the cost may be with help from insurance (it may be cheaper with your insurance), but the website can   give you the price if you did not use any insurance.  - You can print the associated coupon and take it with your prescription to the pharmacy.  - You may also stop by our office during regular business hours and pick up a GoodRx coupon card.  - If you need your prescription sent electronically to a different pharmacy, notify our office through [facility] MyChart or by phone at [phone] option 4.     Si Usted Necesita Algo Despus de Su Visita  Tambin puede  enviarnos un mensaje a travs de MyChart. Por lo general respondemos a los mensajes de MyChart en el transcurso de 1 a 2 das hbiles.  Para renovar recetas, por favor pida a su farmacia que se ponga en contacto con nuestra oficina. Nuestro nmero de fax es el [phone].  Si tiene un asunto urgente cuando la clnica est cerrada y que no puede esperar hasta el siguiente da hbil, puede llamar/localizar a su doctor(a) al nmero que aparece a continuacin.   Por favor, tenga en cuenta que aunque hacemos todo lo posible para estar disponibles para asuntos urgentes fuera del horario de oficina, no estamos disponibles las 24 horas del da, los 7 das de la semana.   Si tiene un problema urgente y no puede comunicarse con nosotros, puede optar por buscar atencin mdica  en el consultorio de su doctor(a), en una clnica privada, en un centro de atencin urgente o en una sala de emergencias.  Si tiene una emergencia mdica, por favor llame inmediatamente al 911 o vaya a la sala de emergencias.  Nmeros de bper  - Dr. Kowalski: [phone]  - Dra. Moye: [phone]  - Dra. Stewart: [phone]  En caso de inclemencias del tiempo, por favor llame a nuestra lnea principal al [phone] para una actualizacin sobre el estado de cualquier retraso o cierre.  Consejos para la medicacin en dermatologa: Por favor, guarde las cajas en las que vienen los medicamentos de uso tpico para ayudarle a seguir las instrucciones sobre dnde y cmo usarlos. Las farmacias generalmente imprimen las instrucciones del medicamento slo en las cajas y no directamente en los tubos del medicamento.   Si su medicamento es muy caro, por favor, pngase en contacto con nuestra oficina llamando al [phone] y presione la opcin 4 o envenos un mensaje a travs de MyChart.   No podemos decirle cul ser su copago por los medicamentos por adelantado ya que esto es diferente dependiendo de la cobertura de su seguro.  Sin embargo, es posible que podamos encontrar un medicamento sustituto a menor costo o llenar un formulario para que el seguro cubra el medicamento que se considera necesario.   Si se requiere una autorizacin previa para que su compaa de seguros cubra su medicamento, por favor permtanos de 1 a 2 das hbiles para completar este proceso.  Los precios de los medicamentos varan con frecuencia dependiendo del lugar de dnde se surte la receta y alguna farmacias pueden ofrecer precios ms baratos.  El sitio web www.goodrx.com tiene cupones para medicamentos de diferentes farmacias. Los precios aqu no tienen en cuenta lo que podra costar con la ayuda del seguro (puede ser ms barato con su seguro), pero el sitio web puede darle el precio si no utiliz ningn seguro.  - Puede imprimir el cupn correspondiente y llevarlo con su receta a la farmacia.  - Tambin puede pasar por nuestra oficina durante el horario de atencin regular y recoger una tarjeta de cupones de GoodRx.  -   Si necesita que su receta se enve electrnicamente a una farmacia diferente, informe a nuestra oficina a travs de MyChart de [facility] o por telfono llamando al [phone] y presione la opcin 4.  

## 2022-05-27 ENCOUNTER — Encounter: Payer: Self-pay | Admitting: Dermatology

## 2022-06-23 NOTE — Progress Notes (Signed)
Follow-up Outpatient Visit Date: 06/24/2022  Primary Care Provider: Crecencio Mc, MD [address] [place] [address]  Chief Complaint: Follow-up cardiomyopathy and uncontrolled hypertension  HPI:  [name] is a 77 y.o. 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  He was last seen in our office by Brett Nordmann, NP, in mid December, at which time he complained of exertional dyspnea and fatigue.  He also reported several bouts of elevated heart rates associated with his dyspnea.  Because he thought he might be back in atrial fibrillation, he decided to double his amiodarone dosing for several days.  Due to persistently elevated blood pressures, he had also been prescribed minoxidil but had yet to start it cardiac catheterization was discussed with [name] though he wished to defer it at that time.  No medication changes were made.  BNP checked at that visit was essentially unchanged since August at 3,565.  Today, [name] reports that he continues to feel progressively worse with more fatigue and exertional dyspnea.  He can only walk about 100 yards before needing to rest.  Some of this is due to dyspnea, though he also notes pain and tightness in his hips and the legs.  This seems to affect his mobility even more than his breathing.  He denies chest pain or recent palpitations to suggest recurrent atrial fibrillation.  He notes that his blood pressures remain labile although typically a little better  than today's readings, noting home reading of 168/67 this morning; this dropped to 138/62 when he stood up.  He still has occasional orthostatic lightheadedness but has not fallen since last April.  He has also been dealing with constipation recently and inquires about potential treatments.  [name] remains on peritoneal dialysis but has been discussing possible transition to hemodialysis in the future with his nephrologist.  He and his daughter are concerned about the need for general anesthesia, particularly if it needs to be done several times for AV fistula formation and PD catheter removal.  --------------------------------------------------------------------------------------------------  Past Medical History:  Diagnosis Date   Acute on chronic systolic congestive heart failure ([place])    Adenocarcinoma of appendix ([facility]) 05/2004   right kidney, s/p cryoablation   Atrial fibrillation with rapid ventricular response ([place]) 05/12/2020   Cardiomyopathy ([place])    a. 12/2018 Echo: EF 40-45%, global HK. Asc Ao 3.7cm; b. 01/2019 [facility] MV: small, mild, fixed basal and mid antlat defect - scar vs artifact. Small, mild mid and apical inf minimally reversible defect, likely scar w/ peri-infarct ischemia. Coronary and Ao atherosclerosis; c. 12/2019 Echo: EF 40-45%, glob HK, Gr1 DD. Nl RV fxn. Sev dil LA. Mild MR. Mild-mod Ao sclerosis w/o stenosis. Asc Ao 26m.   Carotid arterial disease ([place]    a. 01/2020 RICA 1-39%, RCCA/RECA < 550% LICA 19-32% LCCA/LECA <50%.   Claudication ([facility]    a. 02/2020 ABI/TBI: R 1.08/0.95, L 1.00/0.70.   Complication of anesthesia    had to be woken up slowly as his bp was elevated when did this quickly   Diabetes mellitus without complication (HCC)    Dysplastic nevus 02/07/2013   L  upper back, moderate atypia   ESRD (Janziel Hockett stage renal disease) ([place])    a. Peritoneal Dialysis pt.   Hyperlipidemia    Hypertension    Migraine    cluster   Neuromuscular disorder ([place])     left lower extrem neuropathy   Obstructive sleep apnea    no OSA since had facial surgery with dr. Kathyrn Sheriff in 1997   PAD (peripheral artery disease) ([facility]) 06/2007   nonobstructing, renal angiogram ([place])   Renal cell carcinoma 2004   left kidney heminephrectomy   Renal insufficiency    Sepsis ([facility]) 11/06/2020   Skin cancer    R dorsum hand   Stroke ([place])    a. 12/2019 MRI: Small acute infarcts involving the left cerebral hemisphere and several chronic infarcts.   TIA (transient ischemic attack)    no residual but left leg and foot still feel heavy   tobacco abuse    Past Surgical History:  Procedure Laterality Date   CAPD INSERTION N/A 03/01/2019   Procedure: LAPAROSCOPIC INSERTION CONTINUOUS AMBULATORY PERITONEAL DIALYSIS  (CAPD) CATHETER;  Surgeon: Algernon Huxley, MD;  Location: [facility] ORS;  Service: Vascular;  Laterality: N/A;   CARDIAC CATHETERIZATION     Dr. [name] did this to assess his renal artery   cyst removal  12/25/2015   Spine L4 and L5   heminephrectomy  2004   for renal cell CA   RENAL CRYOABLATION  Jan 2006   right kidney,  [name]   sciatica      Current Meds  Medication Sig   albuterol (VENTOLIN HFA) 108 (90 Base) MCG/ACT inhaler Inhale 2 puffs into the lungs every 6 (six) hours as needed for wheezing or shortness of breath.   amiodarone (PACERONE) 200 MG tablet TAKE 1 TABLET BY MOUTH ONCE DAILY   Ascorbic Acid 500 MG CHEW Chew 1 tablet by mouth daily.   b complex vitamins capsule Take 1 capsule by mouth daily.   bisoprolol (ZEBETA) 5 MG tablet Take 1 tablet (5 mg total) by mouth 2 (two) times daily.   cloNIDine (CATAPRES - DOSED IN MG/24 HR) 0.1 mg/24hr patch Place 1 patch (0.1 mg total) onto the skin every Friday.   cloNIDine (CATAPRES) 0.2 MG tablet Take 0.2 mg by mouth at bedtime.   doxazosin (CARDURA) 8 MG tablet Take 8 mg by mouth daily.   ELIQUIS 5 MG TABS tablet TAKE 1 TABLET BY MOUTH TWICE DAILY   ferric citrate (AURYXIA) 1 GM 210 MG(Fe) tablet Take 1  tablet by mouth 3 (three) times daily.   fluticasone (FLONASE) 50 MCG/ACT nasal spray Place 2 sprays into both nostrils daily as needed for allergies or rhinitis.   gentamicin cream (GARAMYCIN) 0.1 % Apply 1 application topically daily.   hydrOXYzine (ATARAX) 25 MG tablet Take 25 mg by mouth 3 (three) times daily.   ketoconazole (NIZORAL) 2 % cream Apply 1 Application topically at bedtime. Qhs to feet for scaly rash on feet   losartan (COZAAR) 100 MG tablet TAKE 1 TABLET BY MOUTH ONCE DAILY *DOSE INCREASE*   multivitamin (RENA-VIT) TABS tablet Take 1 tablet by mouth at bedtime.   sevelamer (RENAGEL) 800 MG tablet Take 800 mg by mouth 3 (three) times daily with meals.   tacrolimus (PROTOPIC) 0.1 % ointment Apply topically 2 (two) times daily. Bid to itchy rash on body   torsemide (DEMADEX) 100 MG tablet Take 100 mg by mouth daily.   triamcinolone cream (KENALOG) 0.1 % Apply 1 Application topically as needed (itching).   [  DISCONTINUED] irbesartan (AVAPRO) 300 MG tablet Take 300 mg by mouth daily.    Allergies: Irbesartan, Cymbalta [duloxetine hcl], Hydralazine, Imdur [isosorbide nitrate], Rosuvastatin, Atorvastatin, and Bystolic [nebivolol hcl]  Social History   Tobacco Use   Smoking status: Every Day    Packs/day: 1.00    Years: 50.00    Total pack years: 50.00    Types: Cigarettes   Smokeless tobacco: Never  Vaping Use   Vaping Use: Former   Devices: tried but did not like  Substance Use Topics   Alcohol use: Not Currently   Drug use: No    Family History  Problem Relation Age of Onset   Hypertension Mother    Cancer Mother        breast   Aneurysm Mother    Coronary artery disease Father    Hypertension Father    Stroke Father 39   Heart disease Father    Heart attack Father 81   Aneurysm Maternal Grandmother        brain   Aneurysm Paternal Grandmother        brain   Coronary artery disease Paternal Grandfather    Heart disease Brother        valvular heart disease    COPD Brother    Hypertension Brother    Stroke Paternal Uncle     Review of Systems: A 12-system review of systems was performed and was negative except as noted in the HPI.  --------------------------------------------------------------------------------------------------  Physical Exam: BP (!) 180/70 (BP Location: Left Arm, Patient Position: Sitting, Cuff Size: Normal)   Pulse 61   Ht '5\' 6"'$  (1.676 m)   Wt 150 lb 6 oz (68.2 kg)   SpO2 96%   BMI 24.27 kg/m   General:  NAD. Neck: No JVD or HJR. Lungs: Clear to auscultation bilaterally without wheezes or crackles. Heart: Regular rate and rhythm without murmurs, rubs, or gallops. Abdomen: Soft, nontender, nondistended. Extremities: No lower extremity edema.  EKG: Normal sinus rhythm with left anterior fascicular block, LVH with abnormal repolarization, and anteroseptal infarct.  QT mildly prolonged (QTc 502 ms).  QRS axis has shifted since 05/04/2022, possibly due to lead placement.  Otherwise, no significant interval change.  Lab Results  Component Value Date   WBC 7.7 01/18/2022   HGB 8.5 (L) 01/18/2022   HCT 26.8 (L) 01/18/2022   MCV 92.1 01/18/2022   PLT 148 (L) 01/18/2022    Lab Results  Component Value Date   NA 141 01/18/2022   K 4.9 01/18/2022   CL 108 01/18/2022   CO2 23 01/18/2022   BUN 54 (H) 01/18/2022   CREATININE 8.45 (H) 01/18/2022   GLUCOSE 87 01/18/2022   ALT 9 09/09/2021    Lab Results  Component Value Date   CHOL 122 05/12/2020   HDL 25 (L) 05/12/2020   LDLCALC 61 05/12/2020   LDLDIRECT 134.0 05/11/2017   TRIG 180 (H) 05/12/2020   CHOLHDL 4.9 05/12/2020    --------------------------------------------------------------------------------------------------  ASSESSMENT AND PLAN: Chronic HFrEF: [name] feels like his stamina and exertional dyspnea are gradually getting worse.  EF has declined to 30-35% on most recent echo in November.  I remain concerned for underlying ischemic heart  disease and have recommended that we proceed with right and left heart catheterization and possible PCI.  He is in agreement with this.  Given his difficult to control hypertension and chronic anemia, it may be preferable to use radial artery and brachial/internal jugular venous access for the procedure, though this  is relatively contraindicated in the setting of his ESRD.  Given that he is on peritoneal dialysis with uncertain plans for transition to hemodialysis, I will reach out to his nephrologist to weigh the risks and benefits of radial versus femoral access.  [name] will need to hold his apixaban for at least 2 days before the procedure with initiation of aspirin 81 mg daily while he is off apixaban.  Continue his current regimen of bisoprolol and losartan for GDMT as well as torsemide to assist with volume management in the setting of ESRD.  Paroxysmal atrial fibrillation: [name] is in sinus rhythm today and reports minimal palpitations.  Continue amiodarone, bisoprolol, and apixaban.  He is due for follow-up with [name] for ongoing management of his PAF.  Uncontrolled hypertension: Blood pressure quite elevated today, though typically a little bit better at home.  [name] continues to be bothered by orthostatic blood pressure drops and intermittent lightheadedness.  We have agreed to defer medication changes today, though I have advised him that it would be okay to take an additional dose of his oral clonidine 0.2 mg during the day if his blood pressure is consistently above 371 mmHg systolic.  [name]-stage renal disease: Per nephrology.  Constipation: In the setting of ESRD, I would recommend use of as needed lactulose.  We will send a prescription for lactulose 15-30 mL daily as needed for constipation.  Shared Decision Making/Informed Consent The risks [stroke (1 in 1000), death (1 in 1000), kidney failure [usually temporary] (1 in 500), bleeding (1 in 200), allergic reaction  [possibly serious] (1 in 200)], benefits (diagnostic support and management of coronary artery disease) and alternatives of a cardiac catheterization were discussed in detail with [name] and he is willing to proceed.  Follow-up: Return to clinic in 3 weeks.  [name], MD 06/24/2022 1:52 PM

## 2022-06-23 NOTE — H&P (View-Only) (Signed)
Follow-up Outpatient Visit Date: 06/24/2022  Primary Care Provider: Crecencio Mc, MD [address] [place] [address]  Chief Complaint: Follow-up cardiomyopathy and uncontrolled hypertension  HPI:  Mr. Garriga is a 77 y.o. male with history of Vladimir Lenhoff-stage renal disease on peritoneal dialysis, cardiomyopathy with an EF of 30-35%, prior abnormal stress test, paroxysmal atrial fibrillation/flutter, hypertension, hyperlipidemia, peripheral arterial disease with chronic dissection of the left subclavian artery followed by vascular surgery, sleep apnea, renal cell carcinoma status post left heminephrectomy, tobacco abuse, stroke, and adenocarcinoma of the appendix, who presents for follow-up of myopathy, atrial fibrillation/flutter, and hypertension.  He was last seen in our office by Gerrie Nordmann, NP, in mid December, at which time he complained of exertional dyspnea and fatigue.  He also reported several bouts of elevated heart rates associated with his dyspnea.  Because he thought he might be back in atrial fibrillation, he decided to double his amiodarone dosing for several days.  Due to persistently elevated blood pressures, he had also been prescribed minoxidil but had yet to start it cardiac catheterization was discussed with Mr. Marriott though he wished to defer it at that time.  No medication changes were made.  BNP checked at that visit was essentially unchanged since August at 3,565.  Today, Mr. Hippert reports that he continues to feel progressively worse with more fatigue and exertional dyspnea.  He can only walk about 100 yards before needing to rest.  Some of this is due to dyspnea, though he also notes pain and tightness in his hips and the legs.  This seems to affect his mobility even more than his breathing.  He denies chest pain or recent palpitations to suggest recurrent atrial fibrillation.  He notes that his blood pressures remain labile although typically a little better  than today's readings, noting home reading of 168/67 this morning; this dropped to 138/62 when he stood up.  He still has occasional orthostatic lightheadedness but has not fallen since last April.  He has also been dealing with constipation recently and inquires about potential treatments.  Mr. Hocker remains on peritoneal dialysis but has been discussing possible transition to hemodialysis in the future with his nephrologist.  He and his daughter are concerned about the need for general anesthesia, particularly if it needs to be done several times for AV fistula formation and PD catheter removal.  --------------------------------------------------------------------------------------------------  Past Medical History:  Diagnosis Date   Acute on chronic systolic congestive heart failure ([place])    Adenocarcinoma of appendix ([place]) 05/2004   right kidney, s/p cryoablation   Atrial fibrillation with rapid ventricular response ([place]) 05/12/2020   Cardiomyopathy ([place])    a. 12/2018 Echo: EF 40-45%, global HK. Asc Ao 3.7cm; b. 01/2019 Lexi MV: small, mild, fixed basal and mid antlat defect - scar vs artifact. Small, mild mid and apical inf minimally reversible defect, likely scar w/ peri-infarct ischemia. Coronary and Ao atherosclerosis; c. 12/2019 Echo: EF 40-45%, glob HK, Gr1 DD. Nl RV fxn. Sev dil LA. Mild MR. Mild-mod Ao sclerosis w/o stenosis. Asc Ao 72m.   Carotid arterial disease ([facility]    a. 01/2020 RICA 1-39%, RCCA/RECA < 572% LICA 15-36% LCCA/LECA <50%.   Claudication ([facility]    a. 02/2020 ABI/TBI: R 1.08/0.95, L 1.00/0.70.   Complication of anesthesia    had to be woken up slowly as his bp was elevated when did this quickly   Diabetes mellitus without complication (HCC)    Dysplastic nevus 02/07/2013   L  upper back, moderate atypia   ESRD (George Alcantar stage renal disease) ([place])    a. Peritoneal Dialysis pt.   Hyperlipidemia    Hypertension    Migraine    cluster   Neuromuscular disorder ([place])     left lower extrem neuropathy   Obstructive sleep apnea    no OSA since had facial surgery with dr. Kathyrn Sheriff in 1997   PAD (peripheral artery disease) ([place]) 06/2007   nonobstructing, renal angiogram ([place])   Renal cell carcinoma 2004   left kidney heminephrectomy   Renal insufficiency    Sepsis ([place]) 11/06/2020   Skin cancer    R dorsum hand   Stroke ([facility])    a. 12/2019 MRI: Small acute infarcts involving the left cerebral hemisphere and several chronic infarcts.   TIA (transient ischemic attack)    no residual but left leg and foot still feel heavy   tobacco abuse    Past Surgical History:  Procedure Laterality Date   CAPD INSERTION N/A 03/01/2019   Procedure: LAPAROSCOPIC INSERTION CONTINUOUS AMBULATORY PERITONEAL DIALYSIS  (CAPD) CATHETER;  Surgeon: Algernon Huxley, MD;  Location: [facility] ORS;  Service: Vascular;  Laterality: N/A;   CARDIAC CATHETERIZATION     Dr. [name] did this to assess his renal artery   cyst removal  12/25/2015   Spine L4 and L5   heminephrectomy  2004   for renal cell CA   RENAL CRYOABLATION  Jan 2006   right kidney,  [name]   sciatica      Current Meds  Medication Sig   albuterol (VENTOLIN HFA) 108 (90 Base) MCG/ACT inhaler Inhale 2 puffs into the lungs every 6 (six) hours as needed for wheezing or shortness of breath.   amiodarone (PACERONE) 200 MG tablet TAKE 1 TABLET BY MOUTH ONCE DAILY   Ascorbic Acid 500 MG CHEW Chew 1 tablet by mouth daily.   b complex vitamins capsule Take 1 capsule by mouth daily.   bisoprolol (ZEBETA) 5 MG tablet Take 1 tablet (5 mg total) by mouth 2 (two) times daily.   cloNIDine (CATAPRES - DOSED IN MG/24 HR) 0.1 mg/24hr patch Place 1 patch (0.1 mg total) onto the skin every Friday.   cloNIDine (CATAPRES) 0.2 MG tablet Take 0.2 mg by mouth at bedtime.   doxazosin (CARDURA) 8 MG tablet Take 8 mg by mouth daily.   ELIQUIS 5 MG TABS tablet TAKE 1 TABLET BY MOUTH TWICE DAILY   ferric citrate (AURYXIA) 1 GM 210 MG(Fe) tablet Take 1  tablet by mouth 3 (three) times daily.   fluticasone (FLONASE) 50 MCG/ACT nasal spray Place 2 sprays into both nostrils daily as needed for allergies or rhinitis.   gentamicin cream (GARAMYCIN) 0.1 % Apply 1 application topically daily.   hydrOXYzine (ATARAX) 25 MG tablet Take 25 mg by mouth 3 (three) times daily.   ketoconazole (NIZORAL) 2 % cream Apply 1 Application topically at bedtime. Qhs to feet for scaly rash on feet   losartan (COZAAR) 100 MG tablet TAKE 1 TABLET BY MOUTH ONCE DAILY *DOSE INCREASE*   multivitamin (RENA-VIT) TABS tablet Take 1 tablet by mouth at bedtime.   sevelamer (RENAGEL) 800 MG tablet Take 800 mg by mouth 3 (three) times daily with meals.   tacrolimus (PROTOPIC) 0.1 % ointment Apply topically 2 (two) times daily. Bid to itchy rash on body   torsemide (DEMADEX) 100 MG tablet Take 100 mg by mouth daily.   triamcinolone cream (KENALOG) 0.1 % Apply 1 Application topically as needed (itching).   [  DISCONTINUED] irbesartan (AVAPRO) 300 MG tablet Take 300 mg by mouth daily.    Allergies: Irbesartan, Cymbalta [duloxetine hcl], Hydralazine, Imdur [isosorbide nitrate], Rosuvastatin, Atorvastatin, and Bystolic [nebivolol hcl]  Social History   Tobacco Use   Smoking status: Every Day    Packs/day: 1.00    Years: 50.00    Total pack years: 50.00    Types: Cigarettes   Smokeless tobacco: Never  Vaping Use   Vaping Use: Former   Devices: tried but did not like  Substance Use Topics   Alcohol use: Not Currently   Drug use: No    Family History  Problem Relation Age of Onset   Hypertension Mother    Cancer Mother        breast   Aneurysm Mother    Coronary artery disease Father    Hypertension Father    Stroke Father 29   Heart disease Father    Heart attack Father 33   Aneurysm Maternal Grandmother        brain   Aneurysm Paternal Grandmother        brain   Coronary artery disease Paternal Grandfather    Heart disease Brother        valvular heart disease    COPD Brother    Hypertension Brother    Stroke Paternal Uncle     Review of Systems: A 12-system review of systems was performed and was negative except as noted in the HPI.  --------------------------------------------------------------------------------------------------  Physical Exam: BP (!) 180/70 (BP Location: Left Arm, Patient Position: Sitting, Cuff Size: Normal)   Pulse 61   Ht '5\' 6"'$  (1.676 m)   Wt 150 lb 6 oz (68.2 kg)   SpO2 96%   BMI 24.27 kg/m   General:  NAD. Neck: No JVD or HJR. Lungs: Clear to auscultation bilaterally without wheezes or crackles. Heart: Regular rate and rhythm without murmurs, rubs, or gallops. Abdomen: Soft, nontender, nondistended. Extremities: No lower extremity edema.  EKG: Normal sinus rhythm with left anterior fascicular block, LVH with abnormal repolarization, and anteroseptal infarct.  QT mildly prolonged (QTc 502 ms).  QRS axis has shifted since 05/04/2022, possibly due to lead placement.  Otherwise, no significant interval change.  Lab Results  Component Value Date   WBC 7.7 01/18/2022   HGB 8.5 (L) 01/18/2022   HCT 26.8 (L) 01/18/2022   MCV 92.1 01/18/2022   PLT 148 (L) 01/18/2022    Lab Results  Component Value Date   NA 141 01/18/2022   K 4.9 01/18/2022   CL 108 01/18/2022   CO2 23 01/18/2022   BUN 54 (H) 01/18/2022   CREATININE 8.45 (H) 01/18/2022   GLUCOSE 87 01/18/2022   ALT 9 09/09/2021    Lab Results  Component Value Date   CHOL 122 05/12/2020   HDL 25 (L) 05/12/2020   LDLCALC 61 05/12/2020   LDLDIRECT 134.0 05/11/2017   TRIG 180 (H) 05/12/2020   CHOLHDL 4.9 05/12/2020    --------------------------------------------------------------------------------------------------  ASSESSMENT AND PLAN: Chronic HFrEF: Mr. Luu feels like his stamina and exertional dyspnea are gradually getting worse.  EF has declined to 30-35% on most recent echo in November.  I remain concerned for underlying ischemic heart  disease and have recommended that we proceed with right and left heart catheterization and possible PCI.  He is in agreement with this.  Given his difficult to control hypertension and chronic anemia, it may be preferable to use radial artery and brachial/internal jugular venous access for the procedure, though this  is relatively contraindicated in the setting of his ESRD.  Given that he is on peritoneal dialysis with uncertain plans for transition to hemodialysis, I will reach out to his nephrologist to weigh the risks and benefits of radial versus femoral access.  Mr. Dittus will need to hold his apixaban for at least 2 days before the procedure with initiation of aspirin 81 mg daily while he is off apixaban.  Continue his current regimen of bisoprolol and losartan for GDMT as well as torsemide to assist with volume management in the setting of ESRD.  Paroxysmal atrial fibrillation: Mr. Mossberg is in sinus rhythm today and reports minimal palpitations.  Continue amiodarone, bisoprolol, and apixaban.  He is due for follow-up with Dr. Quentin Ore for ongoing management of his PAF.  Uncontrolled hypertension: Blood pressure quite elevated today, though typically a little bit better at home.  Mr. Plog continues to be bothered by orthostatic blood pressure drops and intermittent lightheadedness.  We have agreed to defer medication changes today, though I have advised him that it would be okay to take an additional dose of his oral clonidine 0.2 mg during the day if his blood pressure is consistently above 017 mmHg systolic.  Kasem Mozer-stage renal disease: Per nephrology.  Constipation: In the setting of ESRD, I would recommend use of as needed lactulose.  We will send a prescription for lactulose 15-30 mL daily as needed for constipation.  Shared Decision Making/Informed Consent The risks [stroke (1 in 1000), death (1 in 1000), kidney failure [usually temporary] (1 in 500), bleeding (1 in 200), allergic reaction  [possibly serious] (1 in 200)], benefits (diagnostic support and management of coronary artery disease) and alternatives of a cardiac catheterization were discussed in detail with Mr. Norville and he is willing to proceed.  Follow-up: Return to clinic in 3 weeks.  [name], MD 06/24/2022 1:52 PM

## 2022-06-24 ENCOUNTER — Other Ambulatory Visit
Admission: RE | Admit: 2022-06-24 | Discharge: 2022-06-24 | Disposition: A | Discharge status: Home / Self Care | Payer: Medicare PPO | Source: Ambulatory Visit | Attending: Internal Medicine | Admitting: Internal Medicine

## 2022-06-24 ENCOUNTER — Encounter: Payer: Self-pay | Admitting: Internal Medicine

## 2022-06-24 ENCOUNTER — Ambulatory Visit: Payer: Medicare PPO | Attending: Internal Medicine | Admitting: Internal Medicine

## 2022-06-24 VITALS — BP 180/70 | HR 61 | Ht 66.0 in | Wt 150.4 lb

## 2022-06-24 DIAGNOSIS — I5022 Chronic systolic (congestive) heart failure: Secondary | ICD-10-CM

## 2022-06-24 DIAGNOSIS — I10 Essential (primary) hypertension: Secondary | ICD-10-CM | POA: Diagnosis not present

## 2022-06-24 DIAGNOSIS — N186 End stage renal disease: Secondary | ICD-10-CM | POA: Diagnosis not present

## 2022-06-24 DIAGNOSIS — Z992 Dependence on renal dialysis: Secondary | ICD-10-CM

## 2022-06-24 DIAGNOSIS — K5900 Constipation, unspecified: Secondary | ICD-10-CM

## 2022-06-24 DIAGNOSIS — I480 Paroxysmal atrial fibrillation: Secondary | ICD-10-CM

## 2022-06-24 LAB — CBC
HCT: 30.9 % — ABNORMAL LOW (ref 39.0–52.0)
Hemoglobin: 9.9 g/dL — ABNORMAL LOW (ref 13.0–17.0)
MCH: 28.5 pg (ref 26.0–34.0)
MCHC: 32 g/dL (ref 30.0–36.0)
MCV: 89 fL (ref 80.0–100.0)
Platelets: 170 10*3/uL (ref 150–400)
RBC: 3.47 MIL/uL — ABNORMAL LOW (ref 4.22–5.81)
RDW: 14.3 % (ref 11.5–15.5)
WBC: 9.5 10*3/uL (ref 4.0–10.5)
nRBC: 0 % (ref 0.0–0.2)

## 2022-06-24 LAB — BASIC METABOLIC PANEL
Anion gap: 10 (ref 5–15)
BUN: 47 mg/dL — ABNORMAL HIGH (ref 8–23)
CO2: 24 mmol/L (ref 22–32)
Calcium: 8.5 mg/dL — ABNORMAL LOW (ref 8.9–10.3)
Chloride: 104 mmol/L (ref 98–111)
Creatinine, Ser: 7.37 mg/dL — ABNORMAL HIGH (ref 0.61–1.24)
GFR, Estimated: 7 mL/min — ABNORMAL LOW (ref >60)
Glucose, Bld: 107 mg/dL — ABNORMAL HIGH (ref 70–99)
Potassium: 4.8 mmol/L (ref 3.5–5.1)
Sodium: 138 mmol/L (ref 135–145)

## 2022-06-24 NOTE — Patient Instructions (Addendum)
Medication Instructions:  Your Physician recommend you continue on your current medication as directed.    *If you need a refill on your cardiac medications before your next appointment, please call your pharmacy*   Lab Work: Your provider would like for you to have following labs drawn: (CBC, BMP).   Please go to the [facility] entrance and check in at the front desk.  You do not need an appointment.  They are open from 7am-6 pm.   If you have labs (blood work) drawn today and your tests are completely normal, you will receive your results only by: [place] (if you have MyChart) OR A paper copy in the mail If you have any lab test that is abnormal or we need to change your treatment, we will call you to review the results.   Testing/Procedures: Your physician has requested that you have a cardiac catheterization. Cardiac catheterization is used to diagnose and/or treat various heart conditions. Doctors may recommend this procedure for a number of different reasons. The most common reason is to evaluate chest pain. Chest pain can be a symptom of coronary artery disease (CAD), and cardiac catheterization can show whether plaque is narrowing or blocking your heart's arteries. This procedure is also used to evaluate the valves, as well as measure the blood flow and oxygen levels in different parts of your heart. For further information please visit [email or web address]. Please follow instruction sheet, as given.    Follow-Up: At [facility], you and your health needs are our priority.  As part of our continuing mission to provide you with exceptional heart care, we have created designated Provider Care Teams.  These Care Teams include your primary Cardiologist (physician) and Advanced Practice Providers (APPs -  Physician Assistants and Nurse Practitioners) who all work together to provide you with the care you need, when you need it.  We recommend signing up for the  patient portal called "MyChart".  Sign up information is provided on this After Visit Summary.  MyChart is used to connect with patients for Virtual Visits (Telemedicine).  Patients are able to view lab/test results, encounter notes, upcoming appointments, etc.  Non-urgent messages can be sent to your provider as well.   To learn more about what you can do with MyChart, go to [email or web address].    Your next appointment:   3 week(s)  Provider:   You may see [name], MD or one of the following Advanced Practice Providers on your designated Care Team:   Yun Hodgkins, NP Christell Faith, PA-C Cadence Kathlen Mody, PA-C Gerrie Nordmann, NP      [facility] [address] [place] [address] Dept: [place]: [phone]   Cardiac/Peripheral Catheterization   You are scheduled for a Cardiac Catheterization on Tuesday, February 6 with Dr. Harrell Gave End.  1. Arrive at the [facility] entrance at 7:30 am, one hour prior to your procedure. Free valet service is available.  After entering the [facility] please check-in at the registration desk (1st desk on your right) to receive your armband. After receiving your armband someone will escort you to the cardiac cath/special procedures waiting area. The support person will be asked to wait in the waiting room.  It is OK to have someone drop you off and come back when you are ready to be discharged.        Special note: Every effort is made to have your procedure done on time. Please understand that emergencies sometimes delay  scheduled procedures.   . 2. Diet: Do not eat solid foods after midnight.  You may have clear liquids until 5 AM the day of the procedure.  3. Labs: You will need to have blood drawn on today (CBC, BMP)  4. Medication instructions in preparation for your procedure:   Contrast Allergy: No  DO NOT TAKING ELIQUIS Sunday 06/27/22 Monday 06/28/22 Tuesday 06/29/22 (Morning of  procedure)  START Aspirin 81 mg daily Sunday 06/27/22 Monday 06/28/22 Tuesday 06/29/22 (Morning of procedure)  Hold Torsemide day of procedure   On the morning of your procedure, take Aspirin 81 mg and any morning medicines NOT listed above.  You may use sips of water.  5. Plan to go home the same day, you will only stay overnight if medically necessary. 6. You MUST have a responsible adult to drive you home. 7. An adult MUST be with you the first 24 hours after you arrive home. 8. Bring a current list of your medications, and the last time and date medication taken. 9. Bring ID and current insurance cards. 10.Please wear clothes that are easy to get on and off and wear slip-on shoes.  Thank you for allowing [name] to care for you!   -- [facility]

## 2022-06-25 ENCOUNTER — Telehealth: Payer: Self-pay

## 2022-06-25 ENCOUNTER — Ambulatory Visit: Payer: Medicare PPO | Admitting: Internal Medicine

## 2022-06-25 MED ORDER — LACTULOSE 10 GM/15ML PO SOLN: ORAL | 0 refills | Status: DC

## 2022-06-25 NOTE — Telephone Encounter (Signed)
Per Dr. Saunders Revel order lactulose 15-30 mL daily as needed for constipation .  Attempted to contact pt to make aware Left message to call back.

## 2022-06-25 NOTE — Telephone Encounter (Signed)
Pt made aware of MD's recommendations. Pt verbalized understanding.  Nurse contacted pt's pharmacy to verify the best way to order prescription. Per pharmacist, she indicated it's best to order 946 ml solution bottle in order for pt to measure out 15-30 ml as needed.

## 2022-06-29 ENCOUNTER — Other Ambulatory Visit: Payer: Self-pay

## 2022-06-29 ENCOUNTER — Ambulatory Visit
Admission: RE | Admit: 2022-06-29 | Discharge: 2022-06-29 | Disposition: A | Discharge status: Home / Self Care | Payer: Medicare PPO | Attending: Internal Medicine | Admitting: Internal Medicine

## 2022-06-29 ENCOUNTER — Encounter
Admission: RE | Disposition: A | Discharge status: Home / Self Care | Payer: Self-pay | Source: Home / Self Care | Attending: Internal Medicine

## 2022-06-29 ENCOUNTER — Encounter: Payer: Self-pay | Admitting: Internal Medicine

## 2022-06-29 DIAGNOSIS — Z85038 Personal history of other malignant neoplasm of large intestine: Secondary | ICD-10-CM | POA: Insufficient documentation

## 2022-06-29 DIAGNOSIS — I2510 Atherosclerotic heart disease of native coronary artery without angina pectoris: Secondary | ICD-10-CM | POA: Diagnosis not present

## 2022-06-29 DIAGNOSIS — E1151 Type 2 diabetes mellitus with diabetic peripheral angiopathy without gangrene: Secondary | ICD-10-CM | POA: Diagnosis not present

## 2022-06-29 DIAGNOSIS — E1122 Type 2 diabetes mellitus with diabetic chronic kidney disease: Secondary | ICD-10-CM | POA: Insufficient documentation

## 2022-06-29 DIAGNOSIS — E785 Hyperlipidemia, unspecified: Secondary | ICD-10-CM | POA: Diagnosis not present

## 2022-06-29 DIAGNOSIS — Z79899 Other long term (current) drug therapy: Secondary | ICD-10-CM | POA: Insufficient documentation

## 2022-06-29 DIAGNOSIS — Z992 Dependence on renal dialysis: Secondary | ICD-10-CM | POA: Insufficient documentation

## 2022-06-29 DIAGNOSIS — G4733 Obstructive sleep apnea (adult) (pediatric): Secondary | ICD-10-CM | POA: Diagnosis not present

## 2022-06-29 DIAGNOSIS — I429 Cardiomyopathy, unspecified: Secondary | ICD-10-CM | POA: Diagnosis not present

## 2022-06-29 DIAGNOSIS — I132 Hypertensive heart and chronic kidney disease with heart failure and with stage 5 chronic kidney disease, or end stage renal disease: Secondary | ICD-10-CM | POA: Insufficient documentation

## 2022-06-29 DIAGNOSIS — I4510 Unspecified right bundle-branch block: Secondary | ICD-10-CM | POA: Insufficient documentation

## 2022-06-29 DIAGNOSIS — I5020 Unspecified systolic (congestive) heart failure: Secondary | ICD-10-CM

## 2022-06-29 DIAGNOSIS — K5900 Constipation, unspecified: Secondary | ICD-10-CM | POA: Diagnosis not present

## 2022-06-29 DIAGNOSIS — Z8249 Family history of ischemic heart disease and other diseases of the circulatory system: Secondary | ICD-10-CM | POA: Insufficient documentation

## 2022-06-29 DIAGNOSIS — Z85528 Personal history of other malignant neoplasm of kidney: Secondary | ICD-10-CM | POA: Insufficient documentation

## 2022-06-29 DIAGNOSIS — I480 Paroxysmal atrial fibrillation: Secondary | ICD-10-CM | POA: Diagnosis not present

## 2022-06-29 DIAGNOSIS — N186 End stage renal disease: Secondary | ICD-10-CM | POA: Diagnosis not present

## 2022-06-29 DIAGNOSIS — F17210 Nicotine dependence, cigarettes, uncomplicated: Secondary | ICD-10-CM | POA: Insufficient documentation

## 2022-06-29 DIAGNOSIS — I2582 Chronic total occlusion of coronary artery: Secondary | ICD-10-CM | POA: Insufficient documentation

## 2022-06-29 DIAGNOSIS — I5022 Chronic systolic (congestive) heart failure: Secondary | ICD-10-CM

## 2022-06-29 HISTORY — PX: RIGHT HEART CATH AND CORONARY ANGIOGRAPHY: CATH118264

## 2022-06-29 LAB — POCT I-STAT 7, (LYTES, BLD GAS, ICA,H+H)
Acid-Base Excess: 0 mmol/L (ref 0.0–2.0)
Bicarbonate: 24.8 mmol/L (ref 20.0–28.0)
Calcium, Ion: 1.11 mmol/L — ABNORMAL LOW (ref 1.15–1.40)
HCT: 24 % — ABNORMAL LOW (ref 39.0–52.0)
Hemoglobin: 8.2 g/dL — ABNORMAL LOW (ref 13.0–17.0)
O2 Saturation: 90 %
Potassium: 4.2 mmol/L (ref 3.5–5.1)
Sodium: 139 mmol/L (ref 135–145)
TCO2: 26 mmol/L (ref 22–32)
pCO2 arterial: 40.2 mmHg (ref 32–48)
pH, Arterial: 7.398 (ref 7.35–7.45)
pO2, Arterial: 59 mmHg — ABNORMAL LOW (ref 83–108)

## 2022-06-29 SURGERY — RIGHT HEART CATH AND CORONARY ANGIOGRAPHY
Anesthesia: Moderate Sedation | Laterality: Bilateral

## 2022-06-29 MED ORDER — ONDANSETRON HCL 4 MG/2ML IJ SOLN: 4.0000 mg | Freq: Four times a day (QID) | INTRAMUSCULAR | Status: DC | PRN

## 2022-06-29 MED ORDER — ASPIRIN 81 MG PO CHEW
81.0000 mg | CHEWABLE_TABLET | ORAL | Status: AC
  Administered 2022-06-29: 81 mg via ORAL

## 2022-06-29 MED ORDER — VERAPAMIL HCL 2.5 MG/ML IV SOLN
INTRAVENOUS | Status: DC | PRN
  Administered 2022-06-29: 2.5 mg via INTRA_ARTERIAL

## 2022-06-29 MED ORDER — SODIUM CHLORIDE 0.9% FLUSH: 3.0000 mL | INTRAVENOUS | Status: DC | PRN

## 2022-06-29 MED ORDER — HEPARIN SODIUM (PORCINE) 1000 UNIT/ML IJ SOLN
INTRAMUSCULAR | Status: AC
  Filled 2022-06-29: qty 10

## 2022-06-29 MED ORDER — ACETAMINOPHEN 325 MG PO TABS
ORAL_TABLET | ORAL | Status: AC
  Filled 2022-06-29: qty 2

## 2022-06-29 MED ORDER — ACETAMINOPHEN 325 MG PO TABS
650.0000 mg | ORAL_TABLET | ORAL | Status: DC | PRN
  Administered 2022-06-29: 650 mg via ORAL

## 2022-06-29 MED ORDER — MIDAZOLAM HCL 2 MG/2ML IJ SOLN
INTRAMUSCULAR | Status: AC
  Filled 2022-06-29: qty 2

## 2022-06-29 MED ORDER — SODIUM CHLORIDE 0.9% FLUSH: 3.0000 mL | Freq: Two times a day (BID) | INTRAVENOUS | Status: DC

## 2022-06-29 MED ORDER — FENTANYL CITRATE (PF) 100 MCG/2ML IJ SOLN
INTRAMUSCULAR | Status: DC | PRN
  Administered 2022-06-29: 12.5 ug via INTRAVENOUS

## 2022-06-29 MED ORDER — HEPARIN SODIUM (PORCINE) 1000 UNIT/ML IJ SOLN
INTRAMUSCULAR | Status: DC | PRN
  Administered 2022-06-29: 3500 [IU] via INTRAVENOUS

## 2022-06-29 MED ORDER — FENTANYL CITRATE (PF) 100 MCG/2ML IJ SOLN
INTRAMUSCULAR | Status: AC
  Filled 2022-06-29: qty 2

## 2022-06-29 MED ORDER — HEPARIN (PORCINE) IN NACL 1000-0.9 UT/500ML-% IV SOLN
INTRAVENOUS | Status: DC | PRN
  Administered 2022-06-29 (×2): 500 mL

## 2022-06-29 MED ORDER — SODIUM CHLORIDE 0.9 % IV SOLN: INTRAVENOUS | Status: DC

## 2022-06-29 MED ORDER — MIDAZOLAM HCL 2 MG/2ML IJ SOLN
INTRAMUSCULAR | Status: DC | PRN
  Administered 2022-06-29: .5 mg via INTRAVENOUS

## 2022-06-29 MED ORDER — ASPIRIN 81 MG PO CHEW
CHEWABLE_TABLET | ORAL | Status: AC
  Filled 2022-06-29: qty 1

## 2022-06-29 MED ORDER — SODIUM CHLORIDE 0.9 % IV SOLN: 250.0000 mL | INTRAVENOUS | Status: DC | PRN

## 2022-06-29 MED ORDER — VERAPAMIL HCL 2.5 MG/ML IV SOLN
INTRAVENOUS | Status: AC
  Filled 2022-06-29: qty 2

## 2022-06-29 MED ORDER — IOHEXOL 300 MG/ML  SOLN
INTRAMUSCULAR | Status: DC | PRN
  Administered 2022-06-29: 54 mL

## 2022-06-29 MED ORDER — HEPARIN (PORCINE) IN NACL 1000-0.9 UT/500ML-% IV SOLN
INTRAVENOUS | Status: AC
  Filled 2022-06-29: qty 1000

## 2022-06-29 MED ORDER — HYDRALAZINE HCL 20 MG/ML IJ SOLN
10.0000 mg | INTRAMUSCULAR | Status: DC | PRN
  Administered 2022-06-29: 10 mg via INTRAVENOUS

## 2022-06-29 MED ORDER — SODIUM CHLORIDE 0.9 % IV SOLN
250.0000 mL | INTRAVENOUS | Status: DC | PRN
  Administered 2022-06-29: 250 mL via INTRAVENOUS

## 2022-06-29 MED ORDER — AMLODIPINE BESYLATE 5 MG PO TABS: 5.0000 mg | ORAL_TABLET | Freq: Every day | ORAL | 11 refills | Status: DC

## 2022-06-29 MED ORDER — HYDRALAZINE HCL 20 MG/ML IJ SOLN
INTRAMUSCULAR | Status: AC
  Administered 2022-06-29: 10 mg via INTRAVENOUS
  Filled 2022-06-29: qty 1

## 2022-06-29 SURGICAL SUPPLY — 17 items
BAND ZEPHYR COMPRESS 30 LONG (HEMOSTASIS) IMPLANT
CATH 5F 110X4 TIG (CATHETERS) IMPLANT
CATH BALLN WEDGE 5F 110CM (CATHETERS) IMPLANT
CATH INFINITI 5 FR 3DRC (CATHETERS) IMPLANT
CATH LAUNCHER 5F JR4 (CATHETERS) IMPLANT
DRAPE BRACHIAL (DRAPES) IMPLANT
GLIDESHEATH SLEND SS 6F .021 (SHEATH) IMPLANT
GUIDEWIRE INQWIRE 1.5J.035X260 (WIRE) IMPLANT
INQWIRE 1.5J .035X260CM (WIRE) ×1
KIT MICROPUNCTURE NIT STIFF (SHEATH) IMPLANT
PACK CARDIAC CATH (CUSTOM PROCEDURE TRAY) ×1 IMPLANT
PROTECTION STATION PRESSURIZED (MISCELLANEOUS) ×1
SET ATX SIMPLICITY (MISCELLANEOUS) IMPLANT
SHEATH AVANTI 5FR X 11CM (SHEATH) IMPLANT
SHEATH GLIDE SLENDER 4/5FR (SHEATH) IMPLANT
STATION PROTECTION PRESSURIZED (MISCELLANEOUS) IMPLANT
WIRE HITORQ VERSACORE ST 145CM (WIRE) IMPLANT

## 2022-06-29 NOTE — Interval H&P Note (Signed)
History and Physical Interval Note:  06/29/2022 9:18 AM  [name]  has presented today for surgery, with the diagnosis of chronic HFrEF.  The various methods of treatment have been discussed with the patient and family. After consideration of risks, benefits and other options for treatment, the patient has consented to  Procedure(s): RIGHT/LEFT HEART CATH AND CORONARY ANGIOGRAPHY (Bilateral) as a surgical intervention.  The patient's history has been reviewed, patient examined, no change in status, stable for surgery.  I have reviewed the patient's chart and labs.  Questions were answered to the patient's satisfaction.    Cath Lab Visit (complete for each Cath Lab visit)  Clinical Evaluation Leading to the Procedure:   ACS: No.  Non-ACS:    Anginal/Heart Failure Classification: CCS III  Anti-ischemic medical therapy: Minimal Therapy (1 class of medications)  Non-Invasive Test Results: No non-invasive testing performed (LVEF 30-35% -> high risk by echo)  Prior CABG: No previous CABG  [name]

## 2022-06-29 NOTE — Progress Notes (Signed)
Pt. States HA "gone now." Right radial & RIJ sites Clean, dry, intact without any complications at site.

## 2022-06-30 ENCOUNTER — Encounter: Payer: Self-pay | Admitting: Internal Medicine

## 2022-06-30 LAB — POCT I-STAT EG7
Acid-Base Excess: 1 mmol/L (ref 0.0–2.0)
Bicarbonate: 25.8 mmol/L (ref 20.0–28.0)
Calcium, Ion: 1.13 mmol/L — ABNORMAL LOW (ref 1.15–1.40)
HCT: 25 % — ABNORMAL LOW (ref 39.0–52.0)
Hemoglobin: 8.5 g/dL — ABNORMAL LOW (ref 13.0–17.0)
O2 Saturation: 62 %
Potassium: 4.2 mmol/L (ref 3.5–5.1)
Sodium: 138 mmol/L (ref 135–145)
TCO2: 27 mmol/L (ref 22–32)
pCO2, Ven: 42.5 mmHg — ABNORMAL LOW (ref 44–60)
pH, Ven: 7.392 (ref 7.25–7.43)
pO2, Ven: 32 mmHg (ref 32–45)

## 2022-07-07 ENCOUNTER — Ambulatory Visit: Payer: Medicare PPO | Admitting: Dermatology

## 2022-07-12 NOTE — Progress Notes (Signed)
Cardiology Clinic Note   Patient Name: [name] Date of Encounter: 07/16/2022  Primary Care Provider:  Crecencio Mc, MD Primary Cardiologist:  [name], MD  Patient Profile    77 year old male with past medical history of end-stage renal disease on peritoneal dialysis, cardiomyopathy with an EF of 30-35%, prior abnormal stress testing, paroxysmal atrial fibrillation/flutter, hypertension, hyperlipidemia, peripheral arterial disease with chronic dissection of the left subclavian artery followed by vascular surgery, sleep apnea, renal cell carcinoma status post left heminephrectomy, tobacco abuse, stroke, and adenocarcinoma of the appendix, who presents today for follow-up of his cardiomyopathy, atrial fibrillation/flutter, and hypertension.  Past Medical History    Past Medical History:  Diagnosis Date   Acute on chronic systolic congestive heart failure ([place])    Adenocarcinoma of appendix ([place]) 05/2004   right kidney, s/p cryoablation   Atrial fibrillation with rapid ventricular response ([place]) 05/12/2020   Cardiomyopathy ([place])    a. 12/2018 Echo: EF 40-45%, global HK. Asc Ao 3.7cm; b. 01/2019 [facility]i MV: small, mild, fixed basal and mid antlat defect - scar vs artifact. Small, mild mid and apical inf minimally reversible defect, likely scar w/ peri-infarct ischemia. Coronary and Ao atherosclerosis; c. 12/2019 Echo: EF 40-45%, glob HK, Gr1 DD. Nl RV fxn. Sev dil LA. Mild MR. Mild-mod Ao sclerosis w/o stenosis. Asc Ao 46m.   Carotid arterial disease ([place]    a. 01/2020 RICA 1-39%, RCCA/RECA < [id number] LICA [id number] LCCA/LECA <50%.   Claudication ([facility]    a. 02/2020 ABI/TBI: R 1.08/0.95, L 1.00/0.70.   Complication of anesthesia    had to be woken up slowly as his bp was elevated when did this quickly   Diabetes mellitus without complication (HCC)    Dysplastic nevus 02/07/2013   L upper back, moderate atypia   ESRD (end stage renal disease) ([place]    a. Peritoneal Dialysis pt.    Hyperlipidemia    Hypertension    Migraine    cluster   Neuromuscular disorder ([place]    left lower extrem neuropathy   Obstructive sleep apnea    no OSA since had facial surgery with dr. jKathyrn Sheriffin 1997   PAD (peripheral artery disease) ([facility] 06/2007   nonobstructing, renal angiogram ([place])   Renal cell carcinoma 2004   left kidney heminephrectomy   Renal insufficiency    Sepsis ([place] 11/06/2020   Skin cancer    R dorsum hand   Stroke ([place]    a. 12/2019 MRI: Small acute infarcts involving the left cerebral hemisphere and several chronic infarcts.   TIA (transient ischemic attack)    no residual but left leg and foot still feel heavy   tobacco abuse    Past Surgical History:  Procedure Laterality Date   CAPD INSERTION N/A 03/01/2019   Procedure: LAPAROSCOPIC INSERTION CONTINUOUS AMBULATORY PERITONEAL DIALYSIS  (CAPD) CATHETER;  Surgeon: DAlgernon Huxley MD;  Location: [facility] ORS;  Service: Vascular;  Laterality: N/A;   CARDIAC CATHETERIZATION     Dr. AFletcher Anondid this to assess his renal artery   cyst removal  12/25/2015   Spine L4 and L5   heminephrectomy  2004   for renal cell CA   RENAL CRYOABLATION  Jan 2006   right kidney[facility]  RIGHT HEART CATH AND CORONARY ANGIOGRAPHY Bilateral 06/29/2022   Procedure: RIGHT HEART CATH AND CORONARY ANGIOGRAPHY;  Surgeon: ENelva Bush MD;  [facility];  Service: Cardiovascular;  Laterality: Bilateral;   sciatica  Allergies  Allergies  Allergen Reactions   Irbesartan Other (See Comments)    hyperkalemia   Cymbalta [Duloxetine Hcl]    Hydralazine Other (See Comments)    headache   Imdur [Isosorbide Nitrate] Other (See Comments)    headache   Rosuvastatin     Breast swelling/soreness   Atorvastatin Other (See Comments)    Muscle pain   Bystolic [Nebivolol Hcl]     Extreme fatigue     History of Present Illness    [name] is a 77 year old male with previously mentioned past medical history  of end-stage renal disease on peritoneal dialysis contemplating changing to hemodialysis, cardiomyopathy with an EF of 30-35% (drop noted since Nov 2023), prior abnormal stress testing, paroxysmal atrial fibrillation/atrial flutter on chronic anticoagulation of apixaban, hypertension, hyperlipidemia, peripheral artery disease with chronic dissection of the left subclavian artery followed by vascular surgery, sleep apnea, renal cell carcinoma status post lymphedema nephrectomy, tobacco abuse, stroke, and adenocarcinoma of the appendix.  He was last seen in clinic on 06/24/2022 by Dr. Saunders Revel.  At that time he felt like his stamina and exertional dyspnea were gradually getting worse.  Has not after much discussion they decided to undergo a right coronary angiography.  Cath revealed significant two-vessel coronary artery disease with 70 to 80% mid LAD stenosis as well as chronic total occlusion of the right coronary artery with left-to-right collaterals, upper normal to mildly elevated left heart, right heart, and pulmonary artery pressures, normal Fick cardiac output/index, torturous right subclavian artery with at least moderate stenosis affecting ability to advance manipulate catheters.  The recommendation was to optimize medical therapy of chronic HFrEF that is likely mixed ischemic and nonischemic in etiology.  He was started on amlodipine 5 mg daily for improved blood pressure control and antianginal therapy.  Should be considered for PCI to the mid LAD for refractory symptoms.  This would likely necessitate femoral access and lithotripsy versus arthrectomy due to at least moderate calcification and challenging right subclavian artery anatomy.  Secondary prevention of coronary artery disease.  He was to consider a trial of PCSK9 inhibitor given history of statin intolerance to be addressed at follow-up.  He returns to clinic today accompanied by his daughter. He continues to have worsening itching. He stated that  he stopped his dialysis for two days to determine if it would improve his symptoms and he thought that it helped. He has followed up with his Nephrologist and states there have been no changes made to his current dialysis fluids but that he has been advised to do 1 manual bag during the day.  He continues to have exertional dyspnea, but denies chest pain, palpitations, lightheadedness, or peripheral edema.  States that his right arm cath site has done well without any bleeding or hematoma, but the left wrist area where he had a peripheral IV still hurts.   Home Medications    Current Outpatient Medications  Medication Sig Dispense Refill   albuterol (VENTOLIN HFA) 108 (90 Base) MCG/ACT inhaler Inhale 2 puffs into the lungs every 6 (six) hours as needed for wheezing or shortness of breath. 8 g 11   amLODipine (NORVASC) 5 MG tablet Take 1 tablet (5 mg total) by mouth daily. 30 tablet 11   Ascorbic Acid 500 MG CHEW Chew 1 tablet by mouth daily.     b complex vitamins capsule Take 1 capsule by mouth daily.     bisoprolol (ZEBETA) 5 MG tablet Take 1 tablet (5 mg total) by  mouth 2 (two) times daily. 60 tablet 0   cloNIDine (CATAPRES - DOSED IN MG/24 HR) 0.1 mg/24hr patch Place 1 patch (0.1 mg total) onto the skin every Friday. 12 patch 3   cloNIDine (CATAPRES) 0.2 MG tablet Take 0.2 mg by mouth at bedtime.     doxazosin (CARDURA) 8 MG tablet Take 8 mg by mouth daily.     ELIQUIS 5 MG TABS tablet TAKE 1 TABLET BY MOUTH TWICE DAILY 60 tablet 11   fluticasone (FLONASE) 50 MCG/ACT nasal spray Place 2 sprays into both nostrils daily as needed for allergies or rhinitis.     gentamicin cream (GARAMYCIN) 0.1 % Apply 1 application topically daily. 15 g 0   hydrOXYzine (ATARAX) 25 MG tablet Take 25 mg by mouth 3 (three) times daily.     ketoconazole (NIZORAL) 2 % cream Apply 1 Application topically at bedtime. Qhs to feet for scaly rash on feet 60 g 6   lactulose (CHRONULAC) 10 GM/15ML solution Take 15-30 mL by  mouth daily as needed for constipation 946 mL 0   losartan (COZAAR) 100 MG tablet TAKE 1 TABLET BY MOUTH ONCE DAILY *DOSE INCREASE* 90 tablet 0   multivitamin (RENA-VIT) TABS tablet Take 1 tablet by mouth at bedtime.     sevelamer (RENAGEL) 800 MG tablet Take 800 mg by mouth 3 (three) times daily with meals.     tacrolimus (PROTOPIC) 0.1 % ointment Apply topically 2 (two) times daily. Bid to itchy rash on body 100 g 3   torsemide (DEMADEX) 100 MG tablet Take 100 mg by mouth daily.     triamcinolone cream (KENALOG) 0.1 % Apply 1 Application topically as needed (itching).     amiodarone (PACERONE) 200 MG tablet Take 1 tablet (200 mg total) by mouth daily. 90 tablet 3   ferric citrate (AURYXIA) 1 GM 210 MG(Fe) tablet Take 1 tablet by mouth 3 (three) times daily. (Patient not taking: Reported on 06/29/2022)     No current facility-administered medications for this visit.     Family History    Family History  Problem Relation Age of Onset   Hypertension Mother    Cancer Mother        breast   Aneurysm Mother    Coronary artery disease Father    Hypertension Father    Stroke Father 92   Heart disease Father    Heart attack Father 81   Aneurysm Maternal Grandmother        brain   Aneurysm Paternal Grandmother        brain   Coronary artery disease Paternal Grandfather    Heart disease Brother        valvular heart disease   COPD Brother    Hypertension Brother    Stroke Paternal Uncle    He indicated that his mother is deceased. He indicated that his father is deceased. He indicated that his brother is deceased. He indicated that his maternal grandmother is deceased. He indicated that his maternal grandfather is deceased. He indicated that the status of his paternal grandmother is unknown. He indicated that the status of his paternal grandfather is unknown. He indicated that his paternal uncle is deceased.  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, advertising  Tobacco Use   Smoking status: Every Day    Packs/day: 1.00    Years: 50.00    Total pack years: 50.00    Types: Cigarettes   Smokeless tobacco: Never  Vaping Use   Vaping Use: Former   Devices: tried but did not like  Substance and Sexual Activity   Alcohol use: Not Currently   Drug use: No   Sexual activity: Yes  Other Topics Concern   Not on file  Social History Narrative   Not on file   Social Determinants of Health   Financial Resource Strain: Low Risk  (04/22/2022)   Overall Financial Resource Strain (CARDIA)    Difficulty of Paying Living Expenses: Not hard at all  Food Insecurity: No Food Insecurity (04/22/2022)   Hunger Vital Sign    Worried About Running Out of Food in the Last Year: Never true    [place] in the Last Year: Never true  Transportation Needs: No Transportation Needs (04/22/2022)   PRAPARE - Hydrologist (Medical): No    Lack of Transportation (Non-Medical): No  Physical Activity: Insufficiently Active (04/22/2022)   Exercise Vital Sign    Days of Exercise per Week: 3 days    Minutes of Exercise per Session: 20 min  Stress: No Stress Concern Present (04/22/2022)   [place]    Feeling of Stress : Not at all  Social Connections: Unknown (04/22/2022)   Social Connection and Isolation Panel [NHANES]    Frequency of Communication with Friends and Family: Not on file    Frequency of Social Gatherings with Friends and Family: Not on file    Attends Religious Services: Not on file    Active Member of Clubs or Organizations: Not on file    Attends Archivist Meetings: Not on file    Marital Status: Married  Intimate Partner Violence:  Not At Risk (04/22/2022)   Humiliation, Afraid, Rape, and Kick questionnaire    Fear of Current or Ex-Partner: No    Emotionally Abused: No    Physically Abused: No    Sexually Abused: No     Review of Systems    General:  No chills, fever, night sweats or weight changes.  Endorses exertional fatigue Cardiovascular:  No chest pain, endorses dyspnea on exertion, edema, orthopnea, palpitations, paroxysmal nocturnal dyspnea. Dermatological: No rash, lesions/masses, endorses continued itching and dry skin Respiratory: Endorses occasional cough, endorses exertional dyspnea Urologic: No hematuria, dysuria Abdominal:   No nausea, vomiting, diarrhea, bright red blood per rectum, melena, or hematemesis Neurologic:  No visual changes, wkns, changes in mental status. All other systems reviewed and are otherwise negative except as noted above.   Physical Exam    VS:  BP (!) 142/60 (BP Location: Left Arm, Patient Position: Sitting, Cuff Size: Normal)   Pulse (!) 59   Ht '5\' 6"'$  (1.676 m)   Wt 147 lb 12.8 oz (67 kg)   SpO2 96%   BMI 23.86 kg/m  , BMI Body mass index is 23.86 kg/m.     GEN: Well nourished, well developed, in no acute distress. HEENT: normal. Neck: Supple, no JVD, carotid bruits, or masses. Cardiac: RRR, bradycardic, no murmurs, rubs, or gallops. No clubbing, cyanosis, edema.  Radials 2+/PT 2+ and equal bilaterally.  Respiratory:  Respirations regular and unlabored, clear to auscultation bilaterally. GI: Soft, nontender, nondistended, BS + x 4.  Peritoneal dialysis catheter capped and intact. MS: no deformity or atrophy. Skin: warm and dry, no rash.  Skin is extremely dry and flaky. Neuro:  Strength and sensation are intact. Psych: Normal affect.  Accessory Clinical Findings    ECG personally reviewed by me today-sinus bradycardia with a first-degree AV block, left ventricular hypertrophy with early repolarization, left anterior fascicular block- No acute changes  Lab  Results  Component Value Date   WBC 9.5 06/24/2022   HGB 8.2 (L) 06/29/2022   HCT 24.0 (L) 06/29/2022   MCV 89.0 06/24/2022   PLT 170 06/24/2022   Lab Results  Component Value Date   CREATININE 7.37 (H) 06/24/2022   BUN 47 (H) 06/24/2022   NA 139 06/29/2022   K 4.2 06/29/2022   CL 104 06/24/2022   CO2 24 06/24/2022   Lab Results  Component Value Date   ALT 9 09/09/2021   AST 7 09/09/2021   ALKPHOS 128 (H) 09/09/2021   BILITOT <0.2 09/09/2021   Lab Results  Component Value Date   CHOL 122 05/12/2020   HDL 25 (L) 05/12/2020   LDLCALC 61 05/12/2020   LDLDIRECT 134.0 05/11/2017   TRIG 180 (H) 05/12/2020   CHOLHDL 4.9 05/12/2020    Lab Results  Component Value Date   HGBA1C 6.9 (H) 11/21/2020    Assessment & Plan   1.  Chronic HFrEF with last echocardiogram revealed an LVEF of 30-35% which was a drop from previous echocardiogram.  He recently underwent right and left heart catheterization which revealed significant two-vessel coronary artery disease with 70-80% mid LAD stenosis as well as chronic total occlusion of the right coronary artery with left-to-right collaterals, or pulm normal to mildly elevated left heart, right heart, pulmonary artery pressures, normal Fick cardiac output/index, and tortuous right subclavian artery with at least moderate stenosis affecting the ability to advance/manipulate catheters.  It was recommended that he be optimized on heart failure medications and amlodipine 5 mg was added to improve blood pressure as well as antianginal.  It was recommended we could consider PCI to the mid LAD for refractory symptoms but would likely need femoral access.  He is continued on bisoprolol, losartan, and torsemide.  He is not a candidate for SGLT 2 inhibitors or MRA due to GFR of 7 on dialysis.  So continuation escalation of GDMT is limited due to kidney function and dialysis.  2.  Coronary artery disease with stable angina with recent heart catheterization  completed with results as stated above.  He continues with refractory symptoms will likely need intervention to the LAD.  He was previously intolerant to Imdur and angina remains stable today with no ischemic changes noted on EKG.  He is continued on Eliquis and lieu of aspirin and did discuss starting him on PCSK9 inhibitor given his history of statin intolerance.  Prior to starting any new medications he wants to ensure that the PCSK9 inhibitors do not have a side effect that would exacerbate his itching as it was noted on something on the Internet that it potentially could cause rash and itching.  Will readdress at return to see if he would consider a trial for secondary prevention.  We also requesting his labs from [facility] to ensure what his cholesterol is running at this time as well.  3.  Hypertension with a blood pressure today of 142/60 which is an improvement.  He was previously started on amlodipine 5 mg daily for antianginal and blood pressure effect after his procedure.  He  is continued on the rest of his medications have his Catapres patch, Catapres 0.2 mg at bedtime, Cardura, losartan, and torsemide.   4.  Paroxysmal atrial fibrillation status atrial flutter with he remains in a sinus to sinus bradycardia today on EKG.  He is continued on amiodarone 200 mg daily he is requesting a refill of his amiodarone today.  He is also continued on apixaban 5 mg twice daily for CHA2DS2-VASc score of at least 4 and remains on bisoprolol 5 mg twice daily for rate control.  5.  Peripheral arterial disease with history of chronic dissection of the left subclavian artery with palpable pulses that continues to be followed by [facility] also recent carotid duplex in 2023 revealed 1 to 39% stenosis which was stable and without carotid bruit noted on exam.  6.  End-stage renal disease where he continues to currently be on peritoneal dialysis with the decision to be made if he is changing to hemodialysis.  According to the  daughter there was discussion as to if he would have a PermCath inserted to try hemodialysis for 30 days, but she states there has been no further progress towards that recently.  7.  Disposition patient return to clinic to see Dr. Saunders Revel in 6 weeks or sooner if needed or to readdress starting Repatha on a trial even though each individual can have different side effects, to ensure that if he is still on losartan or if he had stopped taking that medication as there was some medication confusion today.  Phelicia Dantes, NP 07/16/2022, 3:55 PM

## 2022-07-16 ENCOUNTER — Ambulatory Visit: Payer: Medicare PPO | Attending: Cardiology | Admitting: Cardiology

## 2022-07-16 ENCOUNTER — Encounter: Payer: Self-pay | Admitting: Cardiology

## 2022-07-16 VITALS — BP 142/60 | HR 59 | Ht 66.0 in | Wt 147.8 lb

## 2022-07-16 DIAGNOSIS — I2510 Atherosclerotic heart disease of native coronary artery without angina pectoris: Secondary | ICD-10-CM

## 2022-07-16 DIAGNOSIS — I2583 Coronary atherosclerosis due to lipid rich plaque: Secondary | ICD-10-CM

## 2022-07-16 DIAGNOSIS — I739 Peripheral vascular disease, unspecified: Secondary | ICD-10-CM

## 2022-07-16 DIAGNOSIS — I5020 Unspecified systolic (congestive) heart failure: Secondary | ICD-10-CM | POA: Diagnosis not present

## 2022-07-16 DIAGNOSIS — N186 End stage renal disease: Secondary | ICD-10-CM

## 2022-07-16 DIAGNOSIS — I25118 Atherosclerotic heart disease of native coronary artery with other forms of angina pectoris: Secondary | ICD-10-CM

## 2022-07-16 DIAGNOSIS — I480 Paroxysmal atrial fibrillation: Secondary | ICD-10-CM

## 2022-07-16 DIAGNOSIS — I1A0 Resistant hypertension: Secondary | ICD-10-CM

## 2022-07-16 DIAGNOSIS — Z992 Dependence on renal dialysis: Secondary | ICD-10-CM

## 2022-07-16 MED ORDER — AMIODARONE HCL 200 MG PO TABS: 200.0000 mg | ORAL_TABLET | Freq: Every day | ORAL | 3 refills | Status: DC

## 2022-07-16 NOTE — Patient Instructions (Signed)
Medication Instructions:   Your physician recommends that you continue on your current medications as directed. Please refer to the Current Medication list given to you today.  *If you need a refill on your cardiac medications before your next appointment, please call your pharmacy*   Lab Work:  None Ordered  If you have labs (blood work) drawn today and your tests are completely normal, you will receive your results only by: [place] (if you have MyChart) OR A paper copy in the mail If you have any lab test that is abnormal or we need to change your treatment, we will call you to review the results.   Testing/Procedures:  None Ordered   Follow-Up: At [facility], you and your health needs are our priority.  As part of our continuing mission to provide you with exceptional heart care, we have created designated Provider Care Teams.  These Care Teams include your primary Cardiologist (physician) and Advanced Practice Providers (APPs -  Physician Assistants and Nurse Practitioners) who all work together to provide you with the care you need, when you need it.  We recommend signing up for the patient portal called "MyChart".  Sign up information is provided on this After Visit Summary.  MyChart is used to connect with patients for Virtual Visits (Telemedicine).  Patients are able to view lab/test results, encounter notes, upcoming appointments, etc.  Non-urgent messages can be sent to your provider as well.   To learn more about what you can do with MyChart, go to [email or web address].    Your next appointment:   6 - 8 week(s)  Provider:   You may see [name], MD

## 2022-07-21 ENCOUNTER — Ambulatory Visit: Payer: Medicare PPO | Admitting: Dermatology

## 2022-07-21 DIAGNOSIS — Z79899 Other long term (current) drug therapy: Secondary | ICD-10-CM | POA: Diagnosis not present

## 2022-07-21 DIAGNOSIS — L299 Pruritus, unspecified: Secondary | ICD-10-CM | POA: Diagnosis not present

## 2022-07-21 DIAGNOSIS — L209 Atopic dermatitis, unspecified: Secondary | ICD-10-CM

## 2022-07-21 DIAGNOSIS — Z7189 Other specified counseling: Secondary | ICD-10-CM

## 2022-07-21 MED ORDER — DUPIXENT 300 MG/2ML ~~LOC~~ SOSY: 300.0000 mg | PREFILLED_SYRINGE | SUBCUTANEOUS | 6 refills | Status: DC

## 2022-07-21 MED ORDER — DUPILUMAB 300 MG/2ML ~~LOC~~ SOSY
600.0000 mg | PREFILLED_SYRINGE | Freq: Once | SUBCUTANEOUS | Status: AC
  Administered 2022-07-21: 600 mg via SUBCUTANEOUS

## 2022-07-21 NOTE — Progress Notes (Signed)
   Follow-Up Visit   Subjective  [name] is a 77 y.o. male who presents for the following: Rash (Patient reports very itchy today here for follow up, back, arms , axilla, legs ). The following portions of the chart were reviewed this encounter and updated as appropriate:  Tobacco  Allergies  Meds  Problems  Med Hx  Surg Hx  Fam Hx     Review of Systems: No other skin or systemic complaints except as noted in HPI or Assessment and Plan.  Objective  Well appearing patient in no apparent distress; mood and affect are within normal limits.  A focused examination was performed including b/l arms, legs, back, trunk. Relevant physical exam findings are noted in the Assessment and Plan.   Assessment & Plan  Atopic dermatitis, unspecified type back, arms, all over skin Atopic Dermatitis with severe generalized xerosis Exacerbated by Pruritus of Renal Disease - pt On Peritoneal dialysis.  Chronic and persistent condition with duration or expected duration over one year. Condition is symptomatic / bothersome to patient. Not to goal.  Start Protopic 0.1% oint bid to aa itchy rash back, arms   Per Dr. Murlean Iba - pts Nephrologist -  in staff message, patient can start [place].  Since pt has tried topical steroid and will have tried tacrolimus if still symptomatic on f/u, will start patient on Dupixent at follow up appt.   Patient approved by Dr. Murlean Iba to have dupixent injection.    Lot [id number] Exp 08/2024 [place] [id number]  New start given injection in right and left arm. Patient tolerated well with no adverse reactions.  dupilumab (DUPIXENT) prefilled syringe 600 mg - back, arms, all over skin  dupilumab (DUPIXENT) 300 MG/2ML prefilled syringe - back, arms, all over skin Inject 300 mg into the skin every 14 (fourteen) days. Starting at day 15 for maintenance.  Return for 2 weeks atopic derm followup and dupixent shot.  IRuthell Rummage, CMA, am acting as scribe  for Sarina Ser, MD. Documentation: I have reviewed the above documentation for accuracy and completeness, and I agree with the above.  Sarina Ser, MD

## 2022-07-21 NOTE — Patient Instructions (Addendum)
Dupilumab (Dupixent) is a treatment given by injection for adults and children with moderate-to-severe atopic dermatitis. Goal is control of skin condition, not cure. It is given as 2 injections at the first dose followed by 1 injection ever 2 weeks thereafter.  Young children are dosed monthly.  Potential side effects include allergic reaction, herpes infections, injection site reactions and conjunctivitis (inflammation of the eyes).  The use of Dupixent requires long term medication management, including periodic office visits.   Due to recent changes in healthcare laws, you may see results of your pathology and/or laboratory studies on MyChart before the doctors have had a chance to review them. We understand that in some cases there may be results that are confusing or concerning to you. Please understand that not all results are received at the same time and often the doctors may need to interpret multiple results in order to provide you with the best plan of care or course of treatment. Therefore, we ask that you please give [name] 2 business days to thoroughly review all your results before contacting the office for clarification. Should we see a critical lab result, you will be contacted sooner.   If You Need Anything After Your Visit  If you have any questions or concerns for your doctor, please call our main line at [phone] and press option 4 to reach your doctor's medical assistant. If no one answers, please leave a voicemail as directed and we will return your call as soon as possible. Messages left after 4 pm will be answered the following business day.   You may also send [name] a message via [place]. We typically respond to MyChart messages within 1-2 business days.  For prescription refills, please ask your pharmacy to contact our office. Our fax number is [phone].  If you have an urgent issue when the clinic is closed that cannot wait until the next business day, you can page your  doctor at the number below.    Please note that while we do our best to be available for urgent issues outside of office hours, we are not available 24/7.   If you have an urgent issue and are unable to reach [name], you may choose to seek medical care at your doctor's office, retail clinic, urgent care center, or emergency room.  If you have a medical emergency, please immediately call 911 or go to the emergency department.  Pager Numbers  - Dr. Nehemiah Massed: [phone]  - Dr. Laurence Ferrari: [phone]  - Dr. Nicole Kindred: [phone]  In the event of inclement weather, please call our main line at [phone] for an update on the status of any delays or closures.  Dermatology Medication Tips: Please keep the boxes that topical medications come in in order to help keep track of the instructions about where and how to use these. Pharmacies typically print the medication instructions only on the boxes and not directly on the medication tubes.   If your medication is too expensive, please contact our office at [phone] option 4 or send [name] a message through [place].   We are unable to tell what your co-pay for medications will be in advance as this is different depending on your insurance coverage. However, we may be able to find a substitute medication at lower cost or fill out paperwork to get insurance to cover a needed medication.   If a prior authorization is required to get your medication covered by your insurance company, please allow [name] 1-2 business days to complete  this process.  Drug prices often vary depending on where the prescription is filled and some pharmacies may offer cheaper prices.  The website www.goodrx.com contains coupons for medications through different pharmacies. The prices here do not account for what the cost may be with help from insurance (it may be cheaper with your insurance), but the website can give you the price if you did not use any insurance.  - You can print  the associated coupon and take it with your prescription to the pharmacy.  - You may also stop by our office during regular business hours and pick up a GoodRx coupon card.  - If you need your prescription sent electronically to a different pharmacy, notify our office through [facility] or by phone at [phone] option 4.     Si Usted Necesita Algo Despus de Su Visita  Tambin puede enviarnos un mensaje a travs de Pharmacist, community. Por lo general respondemos a los mensajes de MyChart en el transcurso de 1 a 2 das hbiles.  Para renovar recetas, por favor pida a su farmacia que se ponga en contacto con nuestra oficina. Harland Dingwall de fax es [place] [phone].  Si tiene un asunto urgente cuando la clnica est cerrada y que no puede esperar hasta el siguiente da hbil, puede llamar/localizar a su doctor(a) al nmero que aparece a continuacin.   Por favor, tenga en cuenta que aunque hacemos todo lo posible para estar disponibles para asuntos urgentes fuera del horario de [place], no estamos disponibles las 24 horas del da, los 7 das de la [place].   Si tiene un problema urgente y no puede comunicarse con nosotros, puede optar por buscar atencin mdica  en el consultorio de su doctor(a), en una clnica privada, en un centro de atencin urgente o en una sala de emergencias.  Si tiene Engineering geologist, por favor llame inmediatamente al 911 o vaya a la sala de emergencias.  Nmeros de bper  - Dr. Nehemiah Massed: [phone]  - Dra. Moye: [phone]  - Dra. Nicole Kindred: [phone]  En caso de inclemencias del [place], por favor llame a Johnsie Kindred principal al [phone] para una actualizacin sobre el [place] de cualquier retraso o cierre.  Consejos para la medicacin en dermatologa: Por favor, guarde las cajas en las que vienen los medicamentos de uso tpico para ayudarle a seguir las instrucciones sobre dnde y cmo usarlos. Las farmacias generalmente imprimen las  instrucciones del medicamento slo en las cajas y no directamente en los tubos del [place].   Si su medicamento es muy caro, por favor, pngase en contacto con Zigmund Daniel llamando al [phone] y presione la opcin 4 o envenos un mensaje a travs de Pharmacist, community.   No podemos decirle cul ser su copago por los medicamentos por adelantado ya que esto es diferente dependiendo de la cobertura de su seguro. Sin embargo, es posible que podamos encontrar un medicamento sustituto a Electrical engineer un formulario para que el seguro cubra el medicamento que se considera necesario.   Si se requiere una autorizacin previa para que su compaa de seguros [place] su medicamento, por favor permtanos de 1 a 2 das hbiles para completar este proceso.  Los precios de los medicamentos varan con frecuencia dependiendo del Environmental consultant de dnde se surte la receta y alguna farmacias pueden ofrecer precios ms baratos.  El sitio web www.goodrx.com tiene cupones para medicamentos de Airline pilot. Los precios aqu no tienen en cuenta lo que podra costar con la ayuda del seguro (  puede ser ms barato con su seguro), pero el sitio web puede darle el precio si no Field seismologist.  - Puede imprimir el cupn correspondiente y llevarlo con su receta a la farmacia.  - Tambin puede pasar por nuestra oficina durante el horario de atencin regular y Charity fundraiser una tarjeta de cupones de GoodRx.  - Si necesita que su receta se enve electrnicamente a una farmacia diferente, informe a nuestra oficina a travs de MyChart de [place] o por telfono llamando al [phone] y presione la opcin 4.

## 2022-07-28 ENCOUNTER — Encounter: Payer: Self-pay | Admitting: Dermatology

## 2022-08-03 ENCOUNTER — Ambulatory Visit: Payer: Medicare PPO | Admitting: Dermatology

## 2022-08-03 VITALS — BP 132/78 | HR 72

## 2022-08-03 DIAGNOSIS — Z79899 Other long term (current) drug therapy: Secondary | ICD-10-CM | POA: Diagnosis not present

## 2022-08-03 DIAGNOSIS — L2081 Atopic neurodermatitis: Secondary | ICD-10-CM

## 2022-08-03 DIAGNOSIS — L299 Pruritus, unspecified: Secondary | ICD-10-CM

## 2022-08-03 DIAGNOSIS — N289 Disorder of kidney and ureter, unspecified: Secondary | ICD-10-CM

## 2022-08-03 MED ORDER — DUPIXENT 300 MG/2ML ~~LOC~~ SOSY: 300.0000 mg | PREFILLED_SYRINGE | SUBCUTANEOUS | 6 refills | Status: DC

## 2022-08-03 MED ORDER — DUPILUMAB 300 MG/2ML ~~LOC~~ SOSY
300.0000 mg | PREFILLED_SYRINGE | Freq: Once | SUBCUTANEOUS | Status: AC
  Administered 2022-08-03: 300 mg via SUBCUTANEOUS

## 2022-08-03 NOTE — Progress Notes (Signed)
   Follow-Up Visit   Subjective  TORRI HIRT is a 77 y.o. male who presents for the following: Eczema (Back, arms, trunk, 2wk f/u Dupixent started 07/21/22, Protopic 0.1% oint bid/tid, no improvement, itching a lot).  The following portions of the chart were reviewed this encounter and updated as appropriate:   Tobacco  Allergies  Meds  Problems  Med Hx  Surg Hx  Fam Hx     Review of Systems:  No other skin or systemic complaints except as noted in HPI or Assessment and Plan.  Objective  Well appearing patient in no apparent distress; mood and affect are within normal limits.  A focused examination was performed including arms, face. Relevant physical exam findings are noted in the Assessment and Plan.   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 Condition is symptomatic / bothersome to patient. Not to goal.   Advised patient that he may not get desired results for several weeks.  Would recommend he consider medication for at least 10 weeks before deciding whether it is working or not.  Also advised itching associated with renal disease is particularly difficult to treat, but Dupixent is most likely the best option for him at this time.  He understands.    Atopic dermatitis - Severe, on Dupixent (biologic medication).  Atopic dermatitis (eczema) is a chronic, relapsing, pruritic condition that can significantly affect quality of life. It is often associated with allergic rhinitis and/or asthma and can require treatment with topical medications, phototherapy, or in severe cases biologic medications, which require long term medication management.    Per Dr. Murlean Iba - pts Nephrologist -  in staff message, patient can start [place] 300mg /46ml sq injections q 2  wks Dupixent 300mg /49ml sq injection to R upper arm, lot [id number], exp 08/2024 Cont Protopic 0.1% oint bid to tid  dupilumab (DUPIXENT) prefilled syringe 300 mg - Chest - Medial (Center) dupilumab (DUPIXENT) 300 MG/2ML prefilled syringe - Chest - Medial (Center) Inject 300 mg into the skin every 14 (fourteen) days. Starting at day 15 for maintenance.  Return in about 2 weeks (around 08/17/2022) for Atopic Derm.  I, Othelia Pulling, RMA, am acting as scribe for Sarina Ser, MD . Documentation: I have reviewed the above documentation for accuracy and completeness, and I agree with the above.  Sarina Ser, MD

## 2022-08-03 NOTE — Patient Instructions (Signed)
Due to recent changes in healthcare laws, you may see results of your pathology and/or laboratory studies on MyChart before the doctors have had a chance to review them. We understand that in some cases there may be results that are confusing or concerning to you. Please understand that not all results are received at the same time and often the doctors may need to interpret multiple results in order to provide you with the best plan of care or course of treatment. Therefore, we ask that you please give us 2 business days to thoroughly review all your results before contacting the office for clarification. Should we see a critical lab result, you will be contacted sooner.   If You Need Anything After Your Visit  If you have any questions or concerns for your doctor, please call our main line at [phone] and press option 4 to reach your doctor's medical assistant. If no one answers, please leave a voicemail as directed and we will return your call as soon as possible. Messages left after 4 pm will be answered the following business day.   You may also send us a message via MyChart. We typically respond to MyChart messages within 1-2 business days.  For prescription refills, please ask your pharmacy to contact our office. Our fax number is [phone].  If you have an urgent issue when the clinic is closed that cannot wait until the next business day, you can page your doctor at the number below.    Please note that while we do our best to be available for urgent issues outside of office hours, we are not available 24/7.   If you have an urgent issue and are unable to reach us, you may choose to seek medical care at your doctor's office, retail clinic, urgent care center, or emergency room.  If you have a medical emergency, please immediately call 911 or go to the emergency department.  Pager Numbers  - Dr. Kowalski: [phone]  - Dr. Moye: [phone]  - Dr. Stewart:  [phone]  In the event of inclement weather, please call our main line at [phone] for an update on the status of any delays or closures.  Dermatology Medication Tips: Please keep the boxes that topical medications come in in order to help keep track of the instructions about where and how to use these. Pharmacies typically print the medication instructions only on the boxes and not directly on the medication tubes.   If your medication is too expensive, please contact our office at [phone] option 4 or send us a message through MyChart.   We are unable to tell what your co-pay for medications will be in advance as this is different depending on your insurance coverage. However, we may be able to find a substitute medication at lower cost or fill out paperwork to get insurance to cover a needed medication.   If a prior authorization is required to get your medication covered by your insurance company, please allow us 1-2 business days to complete this process.  Drug prices often vary depending on where the prescription is filled and some pharmacies may offer cheaper prices.  The website www.goodrx.com contains coupons for medications through different pharmacies. The prices here do not account for what the cost may be with help from insurance (it may be cheaper with your insurance), but the website can give you the price if you did not use any insurance.  - You can print the associated coupon and take it with   your prescription to the pharmacy.  - You may also stop by our office during regular business hours and pick up a GoodRx coupon card.  - If you need your prescription sent electronically to a different pharmacy, notify our office through [place] MyChart or by phone at [phone] option 4.     Si Usted Necesita Algo Despus de Su Visita  Tambin puede enviarnos un mensaje a travs de MyChart. Por lo general respondemos a los mensajes de MyChart en el transcurso de 1 a 2  das hbiles.  Para renovar recetas, por favor pida a su farmacia que se ponga en contacto con nuestra oficina. Nuestro nmero de fax es el [phone].  Si tiene un asunto urgente cuando la clnica est cerrada y que no puede esperar hasta el siguiente da hbil, puede llamar/localizar a su doctor(a) al nmero que aparece a continuacin.   Por favor, tenga en cuenta que aunque hacemos todo lo posible para estar disponibles para asuntos urgentes fuera del horario de oficina, no estamos disponibles las 24 horas del da, los 7 das de la semana.   Si tiene un problema urgente y no puede comunicarse con nosotros, puede optar por buscar atencin mdica  en el consultorio de su doctor(a), en una clnica privada, en un centro de atencin urgente o en una sala de emergencias.  Si tiene una emergencia mdica, por favor llame inmediatamente al 911 o vaya a la sala de emergencias.  Nmeros de bper  - Dr. Kowalski: [phone]  - Dra. Moye: [phone]  - Dra. Stewart: [phone]  En caso de inclemencias del tiempo, por favor llame a nuestra lnea principal al [phone] para una actualizacin sobre el estado de cualquier retraso o cierre.  Consejos para la medicacin en dermatologa: Por favor, guarde las cajas en las que vienen los medicamentos de uso tpico para ayudarle a seguir las instrucciones sobre dnde y cmo usarlos. Las farmacias generalmente imprimen las instrucciones del medicamento slo en las cajas y no directamente en los tubos del medicamento.   Si su medicamento es muy caro, por favor, pngase en contacto con nuestra oficina llamando al [phone] y presione la opcin 4 o envenos un mensaje a travs de MyChart.   No podemos decirle cul ser su copago por los medicamentos por adelantado ya que esto es diferente dependiendo de la cobertura de su seguro. Sin embargo, es posible que podamos encontrar un medicamento sustituto a menor costo o llenar un formulario para que el  seguro cubra el medicamento que se considera necesario.   Si se requiere una autorizacin previa para que su compaa de seguros cubra su medicamento, por favor permtanos de 1 a 2 das hbiles para completar este proceso.  Los precios de los medicamentos varan con frecuencia dependiendo del lugar de dnde se surte la receta y alguna farmacias pueden ofrecer precios ms baratos.  El sitio web www.goodrx.com tiene cupones para medicamentos de diferentes farmacias. Los precios aqu no tienen en cuenta lo que podra costar con la ayuda del seguro (puede ser ms barato con su seguro), pero el sitio web puede darle el precio si no utiliz ningn seguro.  - Puede imprimir el cupn correspondiente y llevarlo con su receta a la farmacia.  - Tambin puede pasar por nuestra oficina durante el horario de atencin regular y recoger una tarjeta de cupones de GoodRx.  - Si necesita que su receta se enve electrnicamente a una farmacia diferente, informe a nuestra oficina a travs de MyChart de [place] [facility] [place]   o por telfono llamando al [phone] y presione la opcin 4.  

## 2022-08-06 ENCOUNTER — Encounter: Payer: Self-pay | Admitting: Dermatology

## 2022-08-16 ENCOUNTER — Emergency Department: Payer: Medicare PPO

## 2022-08-16 ENCOUNTER — Other Ambulatory Visit: Payer: Self-pay

## 2022-08-16 ENCOUNTER — Inpatient Hospital Stay
Admission: EM | Admit: 2022-08-16 | Discharge: 2022-08-17 | DRG: 183 | Disposition: A | Discharge status: Other Acute Inpatient Hospital | Payer: Medicare PPO | Attending: Internal Medicine | Admitting: Internal Medicine

## 2022-08-16 DIAGNOSIS — I132 Hypertensive heart and chronic kidney disease with heart failure and with stage 5 chronic kidney disease, or end stage renal disease: Secondary | ICD-10-CM | POA: Diagnosis present

## 2022-08-16 DIAGNOSIS — F17210 Nicotine dependence, cigarettes, uncomplicated: Secondary | ICD-10-CM | POA: Diagnosis present

## 2022-08-16 DIAGNOSIS — Z8249 Family history of ischemic heart disease and other diseases of the circulatory system: Secondary | ICD-10-CM

## 2022-08-16 DIAGNOSIS — I10 Essential (primary) hypertension: Secondary | ICD-10-CM | POA: Diagnosis not present

## 2022-08-16 DIAGNOSIS — D72829 Elevated white blood cell count, unspecified: Secondary | ICD-10-CM | POA: Diagnosis not present

## 2022-08-16 DIAGNOSIS — I120 Hypertensive chronic kidney disease with stage 5 chronic kidney disease or end stage renal disease: Secondary | ICD-10-CM | POA: Diagnosis not present

## 2022-08-16 DIAGNOSIS — E1122 Type 2 diabetes mellitus with diabetic chronic kidney disease: Secondary | ICD-10-CM | POA: Diagnosis present

## 2022-08-16 DIAGNOSIS — D631 Anemia in chronic kidney disease: Secondary | ICD-10-CM | POA: Diagnosis present

## 2022-08-16 DIAGNOSIS — Z7901 Long term (current) use of anticoagulants: Secondary | ICD-10-CM

## 2022-08-16 DIAGNOSIS — S2242XA Multiple fractures of ribs, left side, initial encounter for closed fracture: Secondary | ICD-10-CM | POA: Diagnosis not present

## 2022-08-16 DIAGNOSIS — E785 Hyperlipidemia, unspecified: Secondary | ICD-10-CM | POA: Diagnosis present

## 2022-08-16 DIAGNOSIS — Z8673 Personal history of transient ischemic attack (TIA), and cerebral infarction without residual deficits: Secondary | ICD-10-CM

## 2022-08-16 DIAGNOSIS — I4892 Unspecified atrial flutter: Secondary | ICD-10-CM | POA: Diagnosis present

## 2022-08-16 DIAGNOSIS — J690 Pneumonitis due to inhalation of food and vomit: Secondary | ICD-10-CM | POA: Diagnosis not present

## 2022-08-16 DIAGNOSIS — Z85828 Personal history of other malignant neoplasm of skin: Secondary | ICD-10-CM

## 2022-08-16 DIAGNOSIS — S225XXA Flail chest, initial encounter for closed fracture: Principal | ICD-10-CM | POA: Diagnosis present

## 2022-08-16 DIAGNOSIS — I480 Paroxysmal atrial fibrillation: Secondary | ICD-10-CM | POA: Diagnosis present

## 2022-08-16 DIAGNOSIS — E1129 Type 2 diabetes mellitus with other diabetic kidney complication: Secondary | ICD-10-CM | POA: Diagnosis not present

## 2022-08-16 DIAGNOSIS — Z85528 Personal history of other malignant neoplasm of kidney: Secondary | ICD-10-CM | POA: Diagnosis not present

## 2022-08-16 DIAGNOSIS — I2510 Atherosclerotic heart disease of native coronary artery without angina pectoris: Secondary | ICD-10-CM | POA: Diagnosis present

## 2022-08-16 DIAGNOSIS — N186 End stage renal disease: Secondary | ICD-10-CM | POA: Diagnosis present

## 2022-08-16 DIAGNOSIS — K5730 Diverticulosis of large intestine without perforation or abscess without bleeding: Secondary | ICD-10-CM | POA: Diagnosis present

## 2022-08-16 DIAGNOSIS — R079 Chest pain, unspecified: Secondary | ICD-10-CM | POA: Diagnosis not present

## 2022-08-16 DIAGNOSIS — N281 Cyst of kidney, acquired: Secondary | ICD-10-CM | POA: Diagnosis present

## 2022-08-16 DIAGNOSIS — G43909 Migraine, unspecified, not intractable, without status migrainosus: Secondary | ICD-10-CM | POA: Diagnosis present

## 2022-08-16 DIAGNOSIS — W1789XA Other fall from one level to another, initial encounter: Secondary | ICD-10-CM | POA: Diagnosis present

## 2022-08-16 DIAGNOSIS — J9691 Respiratory failure, unspecified with hypoxia: Secondary | ICD-10-CM | POA: Diagnosis not present

## 2022-08-16 DIAGNOSIS — K921 Melena: Secondary | ICD-10-CM | POA: Diagnosis not present

## 2022-08-16 DIAGNOSIS — N2581 Secondary hyperparathyroidism of renal origin: Secondary | ICD-10-CM | POA: Diagnosis present

## 2022-08-16 DIAGNOSIS — E1151 Type 2 diabetes mellitus with diabetic peripheral angiopathy without gangrene: Secondary | ICD-10-CM | POA: Diagnosis present

## 2022-08-16 DIAGNOSIS — Z9189 Other specified personal risk factors, not elsewhere classified: Secondary | ICD-10-CM | POA: Diagnosis not present

## 2022-08-16 DIAGNOSIS — R269 Unspecified abnormalities of gait and mobility: Secondary | ICD-10-CM | POA: Diagnosis present

## 2022-08-16 DIAGNOSIS — R160 Hepatomegaly, not elsewhere classified: Secondary | ICD-10-CM | POA: Diagnosis present

## 2022-08-16 DIAGNOSIS — R52 Pain, unspecified: Secondary | ICD-10-CM | POA: Insufficient documentation

## 2022-08-16 DIAGNOSIS — Y990 Civilian activity done for income or pay: Secondary | ICD-10-CM

## 2022-08-16 DIAGNOSIS — I5042 Chronic combined systolic (congestive) and diastolic (congestive) heart failure: Secondary | ICD-10-CM | POA: Diagnosis present

## 2022-08-16 DIAGNOSIS — R1312 Dysphagia, oropharyngeal phase: Secondary | ICD-10-CM | POA: Diagnosis not present

## 2022-08-16 DIAGNOSIS — R112 Nausea with vomiting, unspecified: Secondary | ICD-10-CM | POA: Diagnosis not present

## 2022-08-16 DIAGNOSIS — Z888 Allergy status to other drugs, medicaments and biological substances status: Secondary | ICD-10-CM

## 2022-08-16 DIAGNOSIS — N2889 Other specified disorders of kidney and ureter: Secondary | ICD-10-CM

## 2022-08-16 DIAGNOSIS — Z992 Dependence on renal dialysis: Secondary | ICD-10-CM

## 2022-08-16 DIAGNOSIS — J9601 Acute respiratory failure with hypoxia: Secondary | ICD-10-CM | POA: Diagnosis present

## 2022-08-16 DIAGNOSIS — I739 Peripheral vascular disease, unspecified: Secondary | ICD-10-CM | POA: Diagnosis not present

## 2022-08-16 DIAGNOSIS — S2242XD Multiple fractures of ribs, left side, subsequent encounter for fracture with routine healing: Secondary | ICD-10-CM | POA: Diagnosis not present

## 2022-08-16 DIAGNOSIS — Z515 Encounter for palliative care: Secondary | ICD-10-CM | POA: Diagnosis not present

## 2022-08-16 DIAGNOSIS — Z85038 Personal history of other malignant neoplasm of large intestine: Secondary | ICD-10-CM

## 2022-08-16 DIAGNOSIS — D649 Anemia, unspecified: Secondary | ICD-10-CM | POA: Diagnosis not present

## 2022-08-16 DIAGNOSIS — I429 Cardiomyopathy, unspecified: Secondary | ICD-10-CM | POA: Diagnosis present

## 2022-08-16 DIAGNOSIS — G4733 Obstructive sleep apnea (adult) (pediatric): Secondary | ICD-10-CM | POA: Diagnosis present

## 2022-08-16 DIAGNOSIS — K920 Hematemesis: Secondary | ICD-10-CM | POA: Diagnosis not present

## 2022-08-16 DIAGNOSIS — N185 Chronic kidney disease, stage 5: Secondary | ICD-10-CM | POA: Diagnosis present

## 2022-08-16 DIAGNOSIS — Z7189 Other specified counseling: Secondary | ICD-10-CM | POA: Diagnosis not present

## 2022-08-16 DIAGNOSIS — Z79899 Other long term (current) drug therapy: Secondary | ICD-10-CM

## 2022-08-16 DIAGNOSIS — S2239XA Fracture of one rib, unspecified side, initial encounter for closed fracture: Secondary | ICD-10-CM | POA: Diagnosis present

## 2022-08-16 DIAGNOSIS — I4891 Unspecified atrial fibrillation: Secondary | ICD-10-CM | POA: Diagnosis present

## 2022-08-16 DIAGNOSIS — Z823 Family history of stroke: Secondary | ICD-10-CM

## 2022-08-16 DIAGNOSIS — Z8619 Personal history of other infectious and parasitic diseases: Secondary | ICD-10-CM

## 2022-08-16 LAB — CBC WITH DIFFERENTIAL/PLATELET
Abs Immature Granulocytes: 0.1 10*3/uL — ABNORMAL HIGH (ref 0.00–0.07)
Basophils Absolute: 0.1 10*3/uL (ref 0.0–0.1)
Basophils Relative: 1 %
Eosinophils Absolute: 0.3 10*3/uL (ref 0.0–0.5)
Eosinophils Relative: 3 %
HCT: 30.2 % — ABNORMAL LOW (ref 39.0–52.0)
Hemoglobin: 9.9 g/dL — ABNORMAL LOW (ref 13.0–17.0)
Immature Granulocytes: 1 %
Lymphocytes Relative: 12 %
Lymphs Abs: 1.5 10*3/uL (ref 0.7–4.0)
MCH: 28.9 pg (ref 26.0–34.0)
MCHC: 32.8 g/dL (ref 30.0–36.0)
MCV: 88.3 fL (ref 80.0–100.0)
Monocytes Absolute: 0.7 10*3/uL (ref 0.1–1.0)
Monocytes Relative: 5 %
Neutro Abs: 10.2 10*3/uL — ABNORMAL HIGH (ref 1.7–7.7)
Neutrophils Relative %: 78 %
Platelets: 165 10*3/uL (ref 150–400)
RBC: 3.42 MIL/uL — ABNORMAL LOW (ref 4.22–5.81)
RDW: 14.7 % (ref 11.5–15.5)
WBC: 12.9 10*3/uL — ABNORMAL HIGH (ref 4.0–10.5)
nRBC: 0 % (ref 0.0–0.2)

## 2022-08-16 LAB — COMPREHENSIVE METABOLIC PANEL
ALT: 12 U/L (ref 0–44)
AST: 12 U/L — ABNORMAL LOW (ref 15–41)
Albumin: 2.1 g/dL — ABNORMAL LOW (ref 3.5–5.0)
Alkaline Phosphatase: 107 U/L (ref 38–126)
Anion gap: 9 (ref 5–15)
BUN: 48 mg/dL — ABNORMAL HIGH (ref 8–23)
CO2: 24 mmol/L (ref 22–32)
Calcium: 8 mg/dL — ABNORMAL LOW (ref 8.9–10.3)
Chloride: 101 mmol/L (ref 98–111)
Creatinine, Ser: 7.41 mg/dL — ABNORMAL HIGH (ref 0.61–1.24)
GFR, Estimated: 7 mL/min — ABNORMAL LOW (ref >60)
Glucose, Bld: 127 mg/dL — ABNORMAL HIGH (ref 70–99)
Potassium: 5 mmol/L (ref 3.5–5.1)
Sodium: 134 mmol/L — ABNORMAL LOW (ref 135–145)
Total Bilirubin: 0.5 mg/dL (ref 0.3–1.2)
Total Protein: 5.2 g/dL — ABNORMAL LOW (ref 6.5–8.1)

## 2022-08-16 LAB — LACTIC ACID, PLASMA: Lactic Acid, Venous: 0.5 mmol/L (ref 0.5–1.9)

## 2022-08-16 MED ORDER — HYDRALAZINE HCL 20 MG/ML IJ SOLN
10.0000 mg | INTRAMUSCULAR | Status: DC | PRN
  Administered 2022-08-17: 10 mg via INTRAVENOUS
  Filled 2022-08-16: qty 1

## 2022-08-16 MED ORDER — GABAPENTIN 100 MG PO CAPS
100.0000 mg | ORAL_CAPSULE | Freq: Once | ORAL | Status: AC
  Administered 2022-08-16: 100 mg via ORAL
  Filled 2022-08-16: qty 1

## 2022-08-16 MED ORDER — LACTATED RINGERS IV SOLN: INTRAVENOUS | Status: DC

## 2022-08-16 MED ORDER — SODIUM CHLORIDE 0.9 % IV BOLUS
1000.0000 mL | Freq: Once | INTRAVENOUS | Status: AC
  Administered 2022-08-16: 1000 mL via INTRAVENOUS

## 2022-08-16 MED ORDER — KETOROLAC TROMETHAMINE 30 MG/ML IJ SOLN
15.0000 mg | Freq: Once | INTRAMUSCULAR | Status: AC
  Administered 2022-08-16: 15 mg via INTRAVENOUS
  Filled 2022-08-16: qty 1

## 2022-08-16 MED ORDER — IOHEXOL 300 MG/ML  SOLN
100.0000 mL | Freq: Once | INTRAMUSCULAR | Status: AC | PRN
  Administered 2022-08-16: 100 mL via INTRAVENOUS

## 2022-08-16 MED ORDER — FENTANYL CITRATE PF 50 MCG/ML IJ SOSY
100.0000 ug | PREFILLED_SYRINGE | Freq: Once | INTRAMUSCULAR | Status: AC
  Administered 2022-08-16: 100 ug via INTRAVENOUS
  Filled 2022-08-16: qty 2

## 2022-08-16 MED ORDER — ONDANSETRON HCL 4 MG/2ML IJ SOLN
4.0000 mg | Freq: Four times a day (QID) | INTRAMUSCULAR | Status: DC | PRN
  Administered 2022-08-16 – 2022-08-17 (×3): 4 mg via INTRAVENOUS
  Filled 2022-08-16 (×3): qty 2

## 2022-08-16 NOTE — Assessment & Plan Note (Signed)
We will use positive airway pressure to improve patient comfort and minimize pain from chest flailing.  At this time the area of chest that was noted to be retracting with inspiration was less than 5 cm x 4 cm.  At the lower costal margin anteriorly on the left.  I really do feel that conservative management with positive airway pressure, and pain control and continuous monitoring is the best at this time.

## 2022-08-16 NOTE — Assessment & Plan Note (Signed)
Physical exam finding.  No abnormality reported on abdominal CT, outpatient follow-up

## 2022-08-16 NOTE — Assessment & Plan Note (Signed)
Dental finding on CAT scan, repeat radiography as per radiologist.

## 2022-08-16 NOTE — Assessment & Plan Note (Signed)
Continue with amiodarone, at this time I will hold apixaban until we have established clinical stability for this patient.

## 2022-08-16 NOTE — Assessment & Plan Note (Signed)
Displaced, traumatic.  Managing conservatively at this time no skin changes post.  Fortunately no evidence of big hemothorax or pneumothorax

## 2022-08-16 NOTE — Assessment & Plan Note (Signed)
I believe this is due to patient having chest flailing and splinting.  We will continue with oxygen as needed, monitor with pulse oximetry, turn position to the right side to keep good lung down.  And pulmonary toilet.

## 2022-08-16 NOTE — ED Triage Notes (Signed)
Pt to ED for fall 4 ft onto diesel fuel can, hitting left side. Pt unable to stand up or ambulate, holding left side, grunting. Denies LOC Dialysis pt

## 2022-08-16 NOTE — H&P (Signed)
History and Physical    Patient: [name] [id number] DOB: 12/25/1945 DOA: 08/16/2022 DOS: the patient was seen and examined on 08/16/2022 PCP: Crecencio Mc, MD  Patient coming from: Home  Chief Complaint:  Chief Complaint  Patient presents with   Fall   HPI: [name] is a 77 y.o. male with medical history significant of progressive gait disturbance/leg weakness for about 6 months duration as per the patient.  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  At approximately 2 PM this afternoon, patient was at a worksite and trying to get onto a platform about hip height, unfortunately patient lost his footing while trying to get up and fell onto his right side onto 50 gallon drum hitting the right anterior chest at the edge of the drum.  Followed by severe pain at the site especially with breathing.  Patient denies any presyncope or vertigo or loss of consciousness or any new focal weakness.  Patient denies any other pain such as back pain hip pain or shoulder pain or arm pain that is new.  Patient is brought to [facility] ER, ER course is notable for requiring multiple doses of IV opiates as well as new supplementary oxygen use.  Wife is at the bedside Review of Systems: As mentioned in the history of present illness. All other systems reviewed and are negative. 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 12/2018 Echo: EF 40-45%, global HK. Asc Ao 3.7cm; b. 01/2019 Lexi MV: small, mild, fixed basal and mid antlat defect - scar vs artifact. Small, mild mid and apical inf minimally reversible defect, likely scar w/ peri-infarct ischemia. Coronary and Ao atherosclerosis; c. 12/2019 Echo: EF 40-45%,  glob HK, Gr1 DD. Nl RV fxn. Sev dil LA. Mild MR. Mild-mod Ao sclerosis w/o stenosis. Asc Ao 29mm.   Carotid arterial disease ([place])    a. 01/2020 RICA 1-39%, RCCA/RECA < [id number], LICA [id number], LCCA/LECA <50%.   Claudication ([place])    a. 02/2020 ABI/TBI: R 1.08/0.95, L 1.00/0.70.   Complication of anesthesia    had to be woken up slowly as his bp was elevated when did this quickly   Diabetes mellitus without complication (HCC)    Dysplastic nevus 02/07/2013   L upper back, moderate atypia   ESRD (end stage renal disease) ([place])    a. Peritoneal Dialysis pt.   Hyperlipidemia    Hypertension    Migraine    cluster   Neuromuscular disorder ([place])    left lower extrem neuropathy   Obstructive sleep apnea    no OSA since had facial surgery with dr. Kathyrn Sheriff in 1997   PAD (peripheral artery disease) ([place]) 06/2007   nonobstructing, renal angiogram ([place])   Renal cell carcinoma 2004   left kidney heminephrectomy   Renal insufficiency    Sepsis ([place]) 11/06/2020   Skin cancer    R dorsum hand   Stroke ([place])    a. 12/2019 MRI: Small acute infarcts involving the left cerebral hemisphere and several chronic infarcts.   TIA (transient ischemic attack)    no residual but left leg and foot still feel heavy   tobacco abuse    Past Surgical History:  Procedure Laterality Date  CAPD INSERTION N/A 03/01/2019   Procedure: LAPAROSCOPIC INSERTION CONTINUOUS AMBULATORY PERITONEAL DIALYSIS  (CAPD) CATHETER;  Surgeon: Algernon Huxley, MD;  Location: [facility] ORS;  Service: Vascular;  Laterality: N/A;   CARDIAC CATHETERIZATION     Dr. [name] did this to assess his renal artery   cyst removal  12/25/2015   Spine L4 and L5   heminephrectomy  2004   for renal cell CA   RENAL CRYOABLATION  Jan 2006   right kidney,  [facility]   RIGHT HEART CATH AND CORONARY ANGIOGRAPHY Bilateral 06/29/2022   Procedure: RIGHT HEART CATH AND CORONARY ANGIOGRAPHY;  Surgeon: [name], MD;  Location: [facility];  Service:  Cardiovascular;  Laterality: Bilateral;   sciatica     Social History:  reports that he has been smoking cigarettes. He has a 50.00 pack-year smoking history. He has never used smokeless tobacco. He reports that he does not currently use alcohol. He reports that he does not use drugs.  Allergies  Allergen Reactions   Irbesartan Other (See Comments)    hyperkalemia   Cymbalta [Duloxetine Hcl]    Hydralazine Other (See Comments)    headache   Imdur [Isosorbide Nitrate] Other (See Comments)    headache   Rosuvastatin     Breast swelling/soreness   Atorvastatin Other (See Comments)    Muscle pain   Bystolic [Nebivolol Hcl]     Extreme fatigue     Family History  Problem Relation Age of Onset   Hypertension Mother    Cancer Mother        breast   Aneurysm Mother    Coronary artery disease Father    Hypertension Father    Stroke Father 8   Heart disease Father    Heart attack Father 52   Aneurysm Maternal Grandmother        brain   Aneurysm Paternal Grandmother        brain   Coronary artery disease Paternal Grandfather    Heart disease Brother        valvular heart disease   COPD Brother    Hypertension Brother    Stroke Paternal Uncle     Prior to Admission medications   Medication Sig Start Date End Date Taking? Authorizing Provider  amiodarone (PACERONE) 200 MG tablet Take 1 tablet (200 mg total) by mouth daily. 07/16/22  Yes Hammock, Sheri, NP  amLODipine (NORVASC) 5 MG tablet Take 1 tablet (5 mg total) by mouth daily. 06/29/22 06/29/23 Yes End, Harrell Gave, MD  Ascorbic Acid 500 MG CHEW Chew 1 tablet by mouth daily.   Yes [provider]  b complex vitamins capsule Take 1 capsule by mouth daily.   Yes [provider]  bisoprolol (ZEBETA) 5 MG tablet Take 1 tablet (5 mg total) by mouth 2 (two) times daily. 01/18/22  Yes Emeterio Reeve, DO  cloNIDine (CATAPRES - DOSED IN MG/24 HR) 0.1 mg/24hr patch Place 1 patch (0.1 mg total) onto the skin every  Friday. 03/27/21  Yes Vickie Epley, MD  cloNIDine (CATAPRES) 0.2 MG tablet Take 0.2 mg by mouth at bedtime. 03/05/22  Yes [provider]  doxazosin (CARDURA) 8 MG tablet Take 8 mg by mouth daily. 03/29/20  Yes [provider]  dupilumab (DUPIXENT) 300 MG/2ML prefilled syringe Inject 300 mg into the skin every 14 (fourteen) days. Starting at day 15 for maintenance. 08/03/22  Yes Ralene Bathe, MD  ELIQUIS 5 MG TABS tablet TAKE 1 TABLET BY MOUTH TWICE DAILY  05/13/22  Yes End, Harrell Gave, MD  ferric citrate (AURYXIA) 1 GM 210 MG(Fe) tablet Take 1 tablet by mouth 3 (three) times daily. 07/13/19  Yes [provider]  gentamicin cream (GARAMYCIN) 0.1 % Apply 1 application topically daily. 11/07/20  Yes Fritzi Mandes, MD  hydrOXYzine (ATARAX) 25 MG tablet Take 25 mg by mouth 3 (three) times daily. 12/29/21  Yes [provider]  ketoconazole (NIZORAL) 2 % cream Apply 1 Application topically at bedtime. Qhs to feet for scaly rash on feet 05/26/22  Yes Ralene Bathe, MD  losartan (COZAAR) 100 MG tablet TAKE 1 TABLET BY MOUTH ONCE DAILY *DOSE INCREASE* 03/10/22  Yes End, Harrell Gave, MD  multivitamin (RENA-VIT) TABS tablet Take 1 tablet by mouth at bedtime. 12/21/19  Yes [provider]  sevelamer (RENAGEL) 800 MG tablet Take 800 mg by mouth 3 (three) times daily with meals.   Yes [provider]  tacrolimus (PROTOPIC) 0.1 % ointment Apply topically 2 (two) times daily. Bid to itchy rash on body 05/26/22  Yes Ralene Bathe, MD  torsemide [facility]) 100 MG tablet Take 100 mg by mouth daily.   Yes [provider]  albuterol (VENTOLIN HFA) 108 (90 Base) MCG/ACT inhaler Inhale 2 puffs into the lungs every 6 (six) hours as needed for wheezing or shortness of breath. 05/04/22   End, Harrell Gave, MD  fluticasone (FLONASE) 50 MCG/ACT nasal spray Place 2 sprays into both nostrils daily as needed for allergies or rhinitis.    [provider]   lactulose (CHRONULAC) 10 GM/15ML solution Take 15-30 mL by mouth daily as needed for constipation 06/25/22   End, Harrell Gave, MD  triamcinolone cream (KENALOG) 0.1 % Apply 1 Application topically as needed (itching). 04/13/22   [provider]    Physical Exam: Vitals:   08/16/22 1848 08/16/22 2000 08/16/22 2030 08/16/22 2130  BP: (!) 189/78 (!) 215/78 (!) 206/70   Pulse: 63 (!) 58 (!) 58 63  Resp: 20 18    Temp:      TempSrc:      SpO2: 90% 97% 95% 92%  Weight:      Height:       General: Patient appeared to be restful on initial encounter, however is hurting on attempting to take a deep breath or deep inspiration, patient could not be turned for evaluation of the back.  However patient is otherwise oriented, hard of hearing complaining of dry mouth, which is also visually confirmed. Moderate painful distres. On occasion, severe Respiratory: Poor excursion left anterior inferior (just below the PMI heart) crackles there is direct tenderness to that area.  No skin changes.  This site also has some retraction when the patient takes breath in. Cardiovascular exam S1-S2 normal Abdomen soft nontender, hepatomegaly is easily appreciated Extremities warm without edema Neurologic exam: There is no focal weakness I think patient's strength is 5 x 5 all over Musculoskeletal exam patient has chronic hip pain with flexion however no concern for acute fracture right now.  Back exam could not be done due to patient pain Patient was noted to be requiring 3 L/min of supplemental oxygen to maintain sats over 90%.  Patient was only 89% SpO2 on 2 L/min of supplemental oxygen when initially encountered. Data Reviewed:  Labs on Admission:  Results for orders placed or performed during the hospital encounter of 08/16/22 (from the past 24 hour(s))  Comprehensive metabolic panel     Status: Abnormal   Collection Time: 08/16/22  9:16 PM  Result Value  Ref Range   Sodium 134 (L) 135 - 145 mmol/L    Potassium 5.0 3.5 - 5.1 mmol/L   Chloride 101 98 - 111 mmol/L   CO2 24 22 - 32 mmol/L   Glucose, Bld 127 (H) 70 - 99 mg/dL   BUN 48 (H) 8 - 23 mg/dL   Creatinine, Ser 7.41 (H) 0.61 - 1.24 mg/dL   Calcium 8.0 (L) 8.9 - 10.3 mg/dL   Total Protein 5.2 (L) 6.5 - 8.1 g/dL   Albumin 2.1 (L) 3.5 - 5.0 g/dL   AST 12 (L) 15 - 41 U/L   ALT 12 0 - 44 U/L   Alkaline Phosphatase 107 38 - 126 U/L   Total Bilirubin 0.5 0.3 - 1.2 mg/dL   GFR, Estimated 7 (L) >60 mL/min   Anion gap 9 5 - 15  CBC with Differential     Status: Abnormal   Collection Time: 08/16/22  9:16 PM  Result Value Ref Range   WBC 12.9 (H) 4.0 - 10.5 K/uL   RBC 3.42 (L) 4.22 - 5.81 MIL/uL   Hemoglobin 9.9 (L) 13.0 - 17.0 g/dL   HCT 30.2 (L) 39.0 - 52.0 %   MCV 88.3 80.0 - 100.0 fL   MCH 28.9 26.0 - 34.0 pg   MCHC 32.8 30.0 - 36.0 g/dL   RDW 14.7 11.5 - 15.5 %   Platelets 165 150 - 400 K/uL   nRBC 0.0 0.0 - 0.2 %   Neutrophils Relative % 78 %   Neutro Abs 10.2 (H) 1.7 - 7.7 K/uL   Lymphocytes Relative 12 %   Lymphs Abs 1.5 0.7 - 4.0 K/uL   Monocytes Relative 5 %   Monocytes Absolute 0.7 0.1 - 1.0 K/uL   Eosinophils Relative 3 %   Eosinophils Absolute 0.3 0.0 - 0.5 K/uL   Basophils Relative 1 %   Basophils Absolute 0.1 0.0 - 0.1 K/uL   Immature Granulocytes 1 %   Abs Immature Granulocytes 0.10 (H) 0.00 - 0.07 K/uL  Lactic acid, plasma     Status: None   Collection Time: 08/16/22  9:16 PM  Result Value Ref Range   Lactic Acid, Venous 0.5 0.5 - 1.9 mmol/L   Radiological Exams on Admission:  CT Abdomen Pelvis W Contrast  Result Date: 08/16/2022 CLINICAL DATA:  Abdominal pain, acute, nonlocalized LUQ pain s/p fall, free air seen on CT chest Pt to ED for fall 4 ft onto diesel fuel can, hitting left side. Pt unable to stand up or ambulate, holding left side, grunting EXAM: CT ABDOMEN AND PELVIS WITH CONTRAST TECHNIQUE: Multidetector CT imaging of the abdomen and pelvis was performed using the standard protocol following  bolus administration of intravenous contrast. RADIATION DOSE REDUCTION: This exam was performed according to the departmental dose-optimization program which includes automated exposure control, adjustment of the mA and/or kV according to patient size and/or use of iterative reconstruction technique. CONTRAST:  159mL OMNIPAQUE IOHEXOL 300 MG/ML  SOLN COMPARISON:  CT chest 08/16/2022, CT abdomen pelvis 03/25/2022 FINDINGS: Lower chest: Bronchial wall thickening. Bilateral lower lobe subsegmental atelectasis. Bilateral trace pleural effusions. Aortic valve leaflet and coronary artery calcification. Cardiomegaly. Hepatobiliary: No focal liver abnormality. No gallstones, gallbladder wall thickening, or pericholecystic fluid. No biliary dilatation. Pancreas: No focal lesion. Normal pancreatic contour. No surrounding inflammatory changes. No main pancreatic ductal dilatation. Spleen: Normal in size without focal abnormality.  Spleen noted. Adrenals/Urinary Tract: No adrenal nodule bilaterally. Multiple fluid density lesions within the kidneys suggestive of simple renal cysts.  Simple renal cysts, in the absence of clinically indicated signs/symptoms, require no independent follow-up. There is a 6.4 cm left cystic lesion with interval development of layering hyperdensity of 56 Hounsfield units suggestive of a hemorrhagic cyst. No hydronephrosis. No hydroureter. The urinary bladder is unremarkable. On delayed imaging, there is no excretion of intravenous contrast from either kidneys. Stomach/Bowel: Stomach is within normal limits. No evidence of bowel wall thickening or dilatation. Diffuse colonic diverticulosis. Appendix appears normal. Vascular/Lymphatic: No abdominal aorta or iliac aneurysm. Extensive calcified and noncalcified atherosclerotic plaque of the aorta and its branche with diminutive lumen of the left common iliac artery down to 4 mm. Diminutive lumen of the infra-abdominal proximal abdominal aorta with caliber  of 7 mm. No abdominal, pelvic, or inguinal lymphadenopathy. Reproductive: The prostate is enlarged measuring up to 4.9 cm. Other: Peritoneal dialysis catheter with tip terminating within the right pelvis. Trace free fluid within the upper abdomen and pelvis. Persistent moderate to large volume free intraperitoneal gas within the abdomen and pelvis. No organized fluid collection. No mesenteric fat stranding. Musculoskeletal: No chest wall abnormality. No suspicious lytic or blastic osseous lesions. No acute displaced fracture. Multilevel degenerative changes of the spine. IMPRESSION: 1. Persistent extensive pneumoperitoneum which is more than usual in the setting of a peritoneal dialysis catheter. Given patient has diffuse colonic diverticulosis, finding could be due to a peripheral diverticula; however, no acute diverticulitis identified. No findings of inflammatory changes or other to suggest bowel perforation. 2. A 6.4 cm left cystic lesion with interval development of layering hyperdensity suggestive of a hemorrhagic cyst. Recommend short-term follow-up ultrasound for further evaluation. 3. Colonic diverticulosis with no acute diverticulitis. 4. Bilateral trace pleural effusion and trace simple free fluid ascites. 5.  Aortic Atherosclerosis (ICD10-I70.0) - extensive. 6. Please see separately dictated CT chest 08/15/2023. Electronically Signed   By: Iven Finn M.D.   On: 08/16/2022 21:30   CT Chest Wo Contrast  Result Date: 08/16/2022 CLINICAL DATA:  Chest trauma after a fall. Left anterior lower chest wall pain. EXAM: CT CHEST WITHOUT CONTRAST TECHNIQUE: Multidetector CT imaging of the chest was performed following the standard protocol without IV contrast. RADIATION DOSE REDUCTION: This exam was performed according to the departmental dose-optimization program which includes automated exposure control, adjustment of the mA and/or kV according to patient size and/or use of iterative reconstruction  technique. COMPARISON:  Chest radiograph 08/16/2022 FINDINGS: Cardiovascular: Cardiac enlargement. Small pericardial effusion. Diffuse calcification of the aorta and great vessels as well as the coronary arteries. No aortic aneurysm. Mediastinum/Nodes: Thyroid gland is unremarkable. Esophagus is decompressed. Mediastinal lymph nodes are not pathologically enlarged, likely reactive. Lungs/Pleura: Trace bilateral pleural effusions with basilar atelectasis bilaterally. Emphysematous changes in the lungs. Interstitial septal thickening with bronchial wall thickening likely representing chronic bronchitic change. No focal consolidation. No pneumothorax. Upper Abdomen: Free intraperitoneal air is demonstrated in the upper abdomen. In the absence of recent surgery, this is suspicious for bowel perforation. Heterogeneous enlargement and heterogeneous density of the left kidney suggest renal laceration and hematoma. Small amount of free fluid in the upper abdomen. Completion CT of the abdomen and pelvis with contrast is suggested. Musculoskeletal: Nondisplaced fractures of the left lateral seventh and eighth rib. Possible nondisplaced fracture of the left twelfth rib. Degenerative changes in the spine. No vertebral compression. IMPRESSION: 1. Nondisplaced fractures of the left lateral seventh and eighth ribs as well as possible fracture of the left twelfth rib. 2. Free air in the abdomen with small amount of free fluid likely  indicating bowel perforation. 3. Heterogeneous enlargement and density in the left kidney suggesting renal laceration with hematoma. 4. Completion CT of the abdomen and pelvis with contrast is suggested. 5. Trace bilateral pleural effusions. Emphysematous changes and fibrosis with bronchitic changes in the lungs. 6. Cardiac enlargement with small pericardial effusion. Critical Value/emergent results were called by telephone at the time of interpretation on 08/16/2022 at 8:27 pm to provider [facility] ,  who verbally acknowledged these results. Electronically Signed   By: Lucienne Capers M.D.   On: 08/16/2022 20:35   DG Chest Port 1 View  Result Date: 08/16/2022 CLINICAL DATA:  Fall EXAM: PORTABLE CHEST 1 VIEW COMPARISON:  Chest x-ray dated January 07, 2022 FINDINGS: Unchanged cardiomegaly. Mild diffuse interstitial opacities. No consolidation. Skin folds overlying the left hemithorax. No evidence of pleural effusion or pneumothorax. IMPRESSION: 1. No focal consolidation. 2. Mild diffuse interstitial opacities, likely due to pulmonary edema. Electronically Signed   By: Yetta Glassman M.D.   On: 08/16/2022 16:05      CT images were independently reviewed, I am concerned of a displaced fracture of the rib as highlighted above.  Assessment and Plan: * Flail chest We will use positive airway pressure to improve patient comfort and minimize pain from chest flailing.  At this time the area of chest that was noted to be retracting with inspiration was less than 5 cm x 4 cm.  At the lower costal margin anteriorly on the left.  I really do feel that conservative management with positive airway pressure, and pain control and continuous monitoring is the best at this time.  Renal cyst, native, hemorrhage Dental finding on CAT scan, repeat radiography as per radiologist.  Hepatomegaly Physical exam finding.  No abnormality reported on abdominal CT, outpatient follow-up  Gait disturbance This is chronic, patient will need physical therapy evaluation soon.  Rib fracture Displaced, traumatic.  Managing conservatively at this time no skin changes post.  Fortunately no evidence of big hemothorax or pneumothorax  Pain Multimodal pain regimen has been ordered for this patient.  For comfort.  Acute respiratory failure with hypoxia ([place]) I believe this is due to patient having chest flailing and splinting.  We will continue with oxygen as needed, monitor with pulse oximetry, turn position to the right  side to keep good lung down.  And pulmonary toilet.  Atrial fibrillation and flutter ([place]) Continue with amiodarone, at this time I will hold apixaban until we have established clinical stability for this patient.  CKD (chronic kidney disease), stage V ([place]) I have put in a staff message for Dr. Candiss Norse to kindly evaluate patient in the morning for peritoneal dialysis.  Please follow-up with him accordingly      Advance Care Planning:   Code Status: Prior discussed with patient's wife and patient himself.  Patient deferred to wife.  Apparently patient has filled out some advanced care directives.  This sounds more of the nature of living will.  Wife was unable to tell me if there is a definite DNR.  Regardless both wanted to be full code at this time.  Wife will bring documentation in the a.m.  Will place full code order for the patient  Consults: Please confirm that Dr. Candiss Norse is rounding on the patient in the morning for peritoneal dialysis.  Family Communication: Wife at the bedside  Severity of Illness: The appropriate patient status for this patient is INPATIENT. Inpatient status is judged to be reasonable and necessary in order to provide the  required intensity of service to ensure the patient's safety. The patient's presenting symptoms, physical exam findings, and initial radiographic and laboratory data in the context of their chronic comorbidities is felt to place them at high risk for further clinical deterioration. Furthermore, it is not anticipated that the patient will be medically stable for discharge from the hospital within 2 midnights of admission.   * I certify that at the point of admission it is my clinical judgment that the patient will require inpatient hospital care spanning beyond 2 midnights from the point of admission due to high intensity of service, high risk for further deterioration and high frequency of surveillance required.*  Author: Gertie Fey, MD 08/16/2022  11:22 PM  For on call review [email or web address]

## 2022-08-16 NOTE — ED Provider Notes (Signed)
[facility] Provider Note   Event Date/Time   First MD Initiated Contact with Patient 08/16/22 1555     (approximate) History  Fall  HPI [name] is a 77 y.o. male with a past medical history of CHF and end-stage renal disease on peritoneal dialysis who presents after a mechanical fall from standing resulting in trauma to the left anterior lower rib cage and left flank.  Patient describes a 10/10, aching, nonradiating pain in this left upper abdominal quadrant and left lower chest and flank that does not radiate and is worse with inspiration or palpation. ROS: Patient currently denies any vision changes, tinnitus, difficulty speaking, facial droop, sore throat, abdominal pain, nausea/vomiting/diarrhea, dysuria, or weakness/numbness/paresthesias in any extremity   Physical Exam  Triage Vital Signs: ED Triage Vitals  Enc Vitals Group     BP 08/16/22 1541 (!) 205/77     Pulse Rate 08/16/22 1541 60     Resp 08/16/22 1539 20     Temp 08/16/22 1539 (!) 97.5 F (36.4 C)     Temp Source 08/16/22 1539 Oral     SpO2 08/16/22 1541 96 %     Weight 08/16/22 1540 143 lb (64.9 kg)     Height 08/16/22 1540 5\' 6"  (1.676 m)     Head Circumference --      Peak Flow --      Pain Score 08/16/22 1539 10     Pain Loc --      Pain Edu? --      Excl. in [place]? --    Most recent vital signs: Vitals:   08/16/22 2030 08/16/22 2130  BP: (!) 206/70   Pulse: (!) 58 63  Resp:    Temp:    SpO2: 95% 92%   General: Awake, oriented x4. CV:  Good peripheral perfusion.  Resp:  Normal effort.  Abd:  No distention.  Peritoneal dialysis catheter in place Other:  Elderly the Caucasian male laying in bed in no acute distress.  Sleeping but easily awoken to voice. ED Results / Procedures / Treatments  Labs (all labs ordered are listed, but only abnormal results are displayed) Labs Reviewed  COMPREHENSIVE METABOLIC PANEL - Abnormal; Notable for the following components:       Result Value   Sodium 134 (*)    Glucose, Bld 127 (*)    BUN 48 (*)    Creatinine, Ser 7.41 (*)    Calcium 8.0 (*)    Total Protein 5.2 (*)    Albumin 2.1 (*)    AST 12 (*)    GFR, Estimated 7 (*)    All other components within normal limits  CBC WITH DIFFERENTIAL/PLATELET - Abnormal; Notable for the following components:   WBC 12.9 (*)    RBC 3.42 (*)    Hemoglobin 9.9 (*)    HCT 30.2 (*)    Neutro Abs 10.2 (*)    Abs Immature Granulocytes 0.10 (*)    All other components within normal limits  LACTIC ACID, PLASMA  LACTIC ACID, PLASMA   RADIOLOGY ED MD interpretation: One-view portable chest x-ray interpreted by me shows no evidence of acute abnormalities including no pneumonia, pneumothorax, or widened mediastinum -Agree with radiology assessment Official radiology report(s): CT Abdomen Pelvis W Contrast  Result Date: 08/16/2022 CLINICAL DATA:  Abdominal pain, acute, nonlocalized LUQ pain s/p fall, free air seen on CT chest Pt to ED for fall 4 ft onto diesel fuel can, hitting left side. Pt unable  to stand up or ambulate, holding left side, grunting EXAM: CT ABDOMEN AND PELVIS WITH CONTRAST TECHNIQUE: Multidetector CT imaging of the abdomen and pelvis was performed using the standard protocol following bolus administration of intravenous contrast. RADIATION DOSE REDUCTION: This exam was performed according to the departmental dose-optimization program which includes automated exposure control, adjustment of the mA and/or kV according to patient size and/or use of iterative reconstruction technique. CONTRAST:  135mL OMNIPAQUE IOHEXOL 300 MG/ML  SOLN COMPARISON:  CT chest 08/16/2022, CT abdomen pelvis 03/25/2022 FINDINGS: Lower chest: Bronchial wall thickening. Bilateral lower lobe subsegmental atelectasis. Bilateral trace pleural effusions. Aortic valve leaflet and coronary artery calcification. Cardiomegaly. Hepatobiliary: No focal liver abnormality. No gallstones, gallbladder wall  thickening, or pericholecystic fluid. No biliary dilatation. Pancreas: No focal lesion. Normal pancreatic contour. No surrounding inflammatory changes. No main pancreatic ductal dilatation. Spleen: Normal in size without focal abnormality.  Spleen noted. Adrenals/Urinary Tract: No adrenal nodule bilaterally. Multiple fluid density lesions within the kidneys suggestive of simple renal cysts. Simple renal cysts, in the absence of clinically indicated signs/symptoms, require no independent follow-up. There is a 6.4 cm left cystic lesion with interval development of layering hyperdensity of 56 Hounsfield units suggestive of a hemorrhagic cyst. No hydronephrosis. No hydroureter. The urinary bladder is unremarkable. On delayed imaging, there is no excretion of intravenous contrast from either kidneys. Stomach/Bowel: Stomach is within normal limits. No evidence of bowel wall thickening or dilatation. Diffuse colonic diverticulosis. Appendix appears normal. Vascular/Lymphatic: No abdominal aorta or iliac aneurysm. Extensive calcified and noncalcified atherosclerotic plaque of the aorta and its branche with diminutive lumen of the left common iliac artery down to 4 mm. Diminutive lumen of the infra-abdominal proximal abdominal aorta with caliber of 7 mm. No abdominal, pelvic, or inguinal lymphadenopathy. Reproductive: The prostate is enlarged measuring up to 4.9 cm. Other: Peritoneal dialysis catheter with tip terminating within the right pelvis. Trace free fluid within the upper abdomen and pelvis. Persistent moderate to large volume free intraperitoneal gas within the abdomen and pelvis. No organized fluid collection. No mesenteric fat stranding. Musculoskeletal: No chest wall abnormality. No suspicious lytic or blastic osseous lesions. No acute displaced fracture. Multilevel degenerative changes of the spine. IMPRESSION: 1. Persistent extensive pneumoperitoneum which is more than usual in the setting of a peritoneal  dialysis catheter. Given patient has diffuse colonic diverticulosis, finding could be due to a peripheral diverticula; however, no acute diverticulitis identified. No findings of inflammatory changes or other to suggest bowel perforation. 2. A 6.4 cm left cystic lesion with interval development of layering hyperdensity suggestive of a hemorrhagic cyst. Recommend short-term follow-up ultrasound for further evaluation. 3. Colonic diverticulosis with no acute diverticulitis. 4. Bilateral trace pleural effusion and trace simple free fluid ascites. 5.  Aortic Atherosclerosis (ICD10-I70.0) - extensive. 6. Please see separately dictated CT chest 08/15/2023. Electronically Signed   By: Iven Finn M.D.   On: 08/16/2022 21:30   CT Chest Wo Contrast  Result Date: 08/16/2022 CLINICAL DATA:  Chest trauma after a fall. Left anterior lower chest wall pain. EXAM: CT CHEST WITHOUT CONTRAST TECHNIQUE: Multidetector CT imaging of the chest was performed following the standard protocol without IV contrast. RADIATION DOSE REDUCTION: This exam was performed according to the departmental dose-optimization program which includes automated exposure control, adjustment of the mA and/or kV according to patient size and/or use of iterative reconstruction technique. COMPARISON:  Chest radiograph 08/16/2022 FINDINGS: Cardiovascular: Cardiac enlargement. Small pericardial effusion. Diffuse calcification of the aorta and great vessels as well as the  coronary arteries. No aortic aneurysm. Mediastinum/Nodes: Thyroid gland is unremarkable. Esophagus is decompressed. Mediastinal lymph nodes are not pathologically enlarged, likely reactive. Lungs/Pleura: Trace bilateral pleural effusions with basilar atelectasis bilaterally. Emphysematous changes in the lungs. Interstitial septal thickening with bronchial wall thickening likely representing chronic bronchitic change. No focal consolidation. No pneumothorax. Upper Abdomen: Free intraperitoneal  air is demonstrated in the upper abdomen. In the absence of recent surgery, this is suspicious for bowel perforation. Heterogeneous enlargement and heterogeneous density of the left kidney suggest renal laceration and hematoma. Small amount of free fluid in the upper abdomen. Completion CT of the abdomen and pelvis with contrast is suggested. Musculoskeletal: Nondisplaced fractures of the left lateral seventh and eighth rib. Possible nondisplaced fracture of the left twelfth rib. Degenerative changes in the spine. No vertebral compression. IMPRESSION: 1. Nondisplaced fractures of the left lateral seventh and eighth ribs as well as possible fracture of the left twelfth rib. 2. Free air in the abdomen with small amount of free fluid likely indicating bowel perforation. 3. Heterogeneous enlargement and density in the left kidney suggesting renal laceration with hematoma. 4. Completion CT of the abdomen and pelvis with contrast is suggested. 5. Trace bilateral pleural effusions. Emphysematous changes and fibrosis with bronchitic changes in the lungs. 6. Cardiac enlargement with small pericardial effusion. Critical Value/emergent results were called by telephone at the time of interpretation on 08/16/2022 at 8:27 pm to provider [facility] , who verbally acknowledged these results. Electronically Signed   By: Lucienne Capers M.D.   On: 08/16/2022 20:35   DG Chest Port 1 View  Result Date: 08/16/2022 CLINICAL DATA:  Fall EXAM: PORTABLE CHEST 1 VIEW COMPARISON:  Chest x-ray dated January 07, 2022 FINDINGS: Unchanged cardiomegaly. Mild diffuse interstitial opacities. No consolidation. Skin folds overlying the left hemithorax. No evidence of pleural effusion or pneumothorax. IMPRESSION: 1. No focal consolidation. 2. Mild diffuse interstitial opacities, likely due to pulmonary edema. Electronically Signed   By: Yetta Glassman M.D.   On: 08/16/2022 16:05   PROCEDURES: Critical Care performed: Yes, see critical care  procedure note(s) .1-3 Lead EKG Interpretation  Performed by: Naaman Plummer, MD Authorized by: Naaman Plummer, MD     Interpretation: normal     ECG rate:  71   ECG rate assessment: normal     Rhythm: sinus rhythm     Ectopy: none     Conduction: normal   CRITICAL CARE Performed by: Naaman Plummer  Total critical care time: 31 minutes  Critical care time was exclusive of separately billable procedures and treating other patients.  Critical care was necessary to treat or prevent imminent or life-threatening deterioration.  Critical care was time spent personally by me on the following activities: development of treatment plan with patient and/or surrogate as well as nursing, discussions with consultants, evaluation of patient's response to treatment, examination of patient, obtaining history from patient or surrogate, ordering and performing treatments and interventions, ordering and review of laboratory studies, ordering and review of radiographic studies, pulse oximetry and re-evaluation of patient's condition.  MEDICATIONS ORDERED IN ED: Medications  ondansetron (ZOFRAN) injection 4 mg (4 mg Intravenous Given 08/16/22 1609)  fentaNYL (SUBLIMAZE) injection 100 mcg (100 mcg Intravenous Given 08/16/22 1610)  ketorolac (TORADOL) 30 MG/ML injection 15 mg (15 mg Intravenous Given 08/16/22 1844)  sodium chloride 0.9 % bolus 1,000 mL (1,000 mLs Intravenous New Bag/Given 08/16/22 2115)  iohexol (OMNIPAQUE) 300 MG/ML solution 100 mL (100 mLs Intravenous Contrast Given 08/16/22 2057)  gabapentin (  NEURONTIN) capsule 100 mg (100 mg Oral Given 08/16/22 2138)   IMPRESSION / MDM / ASSESSMENT AND PLAN / ED COURSE  I reviewed the triage vital signs and the nursing notes.                             The patient is on the cardiac monitor to evaluate for evidence of arrhythmia and/or significant heart rate changes. Patient's presentation is most consistent with acute presentation with potential threat  to life or bodily function. Rib fractures Consider possibility of pneumonia however based upon chest x-ray, pulmonary exam, and vital signs, and history there is no evidence of a pneumonia secondary to the rib fractures at this time Considered possibility of flail chest however chest x-ray is not consistent with this finding nor is examination and given patient has no respiratory distress this is exceedingly unlikely. There is no evidence of open rib fractures protruding through skin normal rib fractures injuring any of the vital organs in the chest. Plan Analgesia Inspiratory flow to minimize risk of pneumonia Supplemental oxygenation as patient did have desaturation to the upper 80s while sleeping.  Dispo: Admit to medicine   FINAL CLINICAL IMPRESSION(S) / ED DIAGNOSES   Final diagnoses:  Closed fracture of multiple ribs of left side, initial encounter  Acute respiratory failure with hypoxia ([place])   Rx / DC Orders   ED Discharge Orders     None      Note:  This document was prepared using Dragon voice recognition software and may include unintentional dictation errors.   Naaman Plummer, MD 08/16/22 [phone]

## 2022-08-16 NOTE — Assessment & Plan Note (Signed)
Multimodal pain regimen has been ordered for this patient.  For comfort.

## 2022-08-16 NOTE — Assessment & Plan Note (Signed)
I have put in a staff message for Dr. Candiss Norse to kindly evaluate patient in the morning for peritoneal dialysis.  Please follow-up with him accordingly

## 2022-08-16 NOTE — Assessment & Plan Note (Signed)
This is chronic, patient will need physical therapy evaluation soon.

## 2022-08-17 ENCOUNTER — Inpatient Hospital Stay (HOSPITAL_COMMUNITY): Payer: Medicare PPO

## 2022-08-17 ENCOUNTER — Encounter (HOSPITAL_COMMUNITY): Payer: Self-pay | Admitting: Internal Medicine

## 2022-08-17 ENCOUNTER — Other Ambulatory Visit: Payer: Self-pay

## 2022-08-17 ENCOUNTER — Encounter (HOSPITAL_COMMUNITY): Payer: Self-pay

## 2022-08-17 ENCOUNTER — Inpatient Hospital Stay (HOSPITAL_COMMUNITY)
Admission: RE | Admit: 2022-08-17 | Discharge: 2022-09-22 | DRG: 183 | Disposition: E | Payer: Medicare PPO | Source: Other Acute Inpatient Hospital | Attending: Internal Medicine | Admitting: Internal Medicine

## 2022-08-17 DIAGNOSIS — I2510 Atherosclerotic heart disease of native coronary artery without angina pectoris: Secondary | ICD-10-CM | POA: Diagnosis present

## 2022-08-17 DIAGNOSIS — E1140 Type 2 diabetes mellitus with diabetic neuropathy, unspecified: Secondary | ICD-10-CM | POA: Diagnosis present

## 2022-08-17 DIAGNOSIS — E1151 Type 2 diabetes mellitus with diabetic peripheral angiopathy without gangrene: Secondary | ICD-10-CM | POA: Diagnosis present

## 2022-08-17 DIAGNOSIS — R627 Adult failure to thrive: Secondary | ICD-10-CM | POA: Diagnosis not present

## 2022-08-17 DIAGNOSIS — E875 Hyperkalemia: Secondary | ICD-10-CM | POA: Diagnosis present

## 2022-08-17 DIAGNOSIS — I480 Paroxysmal atrial fibrillation: Secondary | ICD-10-CM | POA: Diagnosis present

## 2022-08-17 DIAGNOSIS — S2242XA Multiple fractures of ribs, left side, initial encounter for closed fracture: Secondary | ICD-10-CM

## 2022-08-17 DIAGNOSIS — N179 Acute kidney failure, unspecified: Secondary | ICD-10-CM | POA: Diagnosis not present

## 2022-08-17 DIAGNOSIS — K920 Hematemesis: Secondary | ICD-10-CM | POA: Diagnosis not present

## 2022-08-17 DIAGNOSIS — Z9109 Other allergy status, other than to drugs and biological substances: Secondary | ICD-10-CM

## 2022-08-17 DIAGNOSIS — K226 Gastro-esophageal laceration-hemorrhage syndrome: Secondary | ICD-10-CM | POA: Diagnosis not present

## 2022-08-17 DIAGNOSIS — Z9189 Other specified personal risk factors, not elsewhere classified: Secondary | ICD-10-CM | POA: Diagnosis not present

## 2022-08-17 DIAGNOSIS — W1830XA Fall on same level, unspecified, initial encounter: Secondary | ICD-10-CM | POA: Diagnosis present

## 2022-08-17 DIAGNOSIS — Z992 Dependence on renal dialysis: Secondary | ICD-10-CM | POA: Diagnosis not present

## 2022-08-17 DIAGNOSIS — Z7189 Other specified counseling: Secondary | ICD-10-CM | POA: Diagnosis not present

## 2022-08-17 DIAGNOSIS — I132 Hypertensive heart and chronic kidney disease with heart failure and with stage 5 chronic kidney disease, or end stage renal disease: Secondary | ICD-10-CM | POA: Diagnosis not present

## 2022-08-17 DIAGNOSIS — Z85528 Personal history of other malignant neoplasm of kidney: Secondary | ICD-10-CM

## 2022-08-17 DIAGNOSIS — E1122 Type 2 diabetes mellitus with diabetic chronic kidney disease: Secondary | ICD-10-CM | POA: Diagnosis present

## 2022-08-17 DIAGNOSIS — Y93H3 Activity, building and construction: Secondary | ICD-10-CM

## 2022-08-17 DIAGNOSIS — Z515 Encounter for palliative care: Secondary | ICD-10-CM

## 2022-08-17 DIAGNOSIS — M898X9 Other specified disorders of bone, unspecified site: Secondary | ICD-10-CM | POA: Diagnosis present

## 2022-08-17 DIAGNOSIS — S2242XD Multiple fractures of ribs, left side, subsequent encounter for fracture with routine healing: Secondary | ICD-10-CM | POA: Diagnosis not present

## 2022-08-17 DIAGNOSIS — N186 End stage renal disease: Secondary | ICD-10-CM | POA: Diagnosis present

## 2022-08-17 DIAGNOSIS — J90 Pleural effusion, not elsewhere classified: Secondary | ICD-10-CM | POA: Diagnosis not present

## 2022-08-17 DIAGNOSIS — Z66 Do not resuscitate: Secondary | ICD-10-CM | POA: Diagnosis not present

## 2022-08-17 DIAGNOSIS — D631 Anemia in chronic kidney disease: Secondary | ICD-10-CM | POA: Diagnosis present

## 2022-08-17 DIAGNOSIS — I2582 Chronic total occlusion of coronary artery: Secondary | ICD-10-CM | POA: Diagnosis present

## 2022-08-17 DIAGNOSIS — R339 Retention of urine, unspecified: Secondary | ICD-10-CM | POA: Diagnosis not present

## 2022-08-17 DIAGNOSIS — D649 Anemia, unspecified: Secondary | ICD-10-CM | POA: Diagnosis present

## 2022-08-17 DIAGNOSIS — J449 Chronic obstructive pulmonary disease, unspecified: Secondary | ICD-10-CM | POA: Diagnosis present

## 2022-08-17 DIAGNOSIS — Y990 Civilian activity done for income or pay: Secondary | ICD-10-CM | POA: Diagnosis not present

## 2022-08-17 DIAGNOSIS — Z85828 Personal history of other malignant neoplasm of skin: Secondary | ICD-10-CM

## 2022-08-17 DIAGNOSIS — I429 Cardiomyopathy, unspecified: Secondary | ICD-10-CM | POA: Diagnosis present

## 2022-08-17 DIAGNOSIS — I10 Essential (primary) hypertension: Secondary | ICD-10-CM | POA: Diagnosis not present

## 2022-08-17 DIAGNOSIS — J9601 Acute respiratory failure with hypoxia: Secondary | ICD-10-CM | POA: Diagnosis not present

## 2022-08-17 DIAGNOSIS — I5023 Acute on chronic systolic (congestive) heart failure: Secondary | ICD-10-CM | POA: Diagnosis not present

## 2022-08-17 DIAGNOSIS — J439 Emphysema, unspecified: Secondary | ICD-10-CM | POA: Diagnosis not present

## 2022-08-17 DIAGNOSIS — N281 Cyst of kidney, acquired: Secondary | ICD-10-CM | POA: Diagnosis present

## 2022-08-17 DIAGNOSIS — R1312 Dysphagia, oropharyngeal phase: Secondary | ICD-10-CM | POA: Diagnosis not present

## 2022-08-17 DIAGNOSIS — R54 Age-related physical debility: Secondary | ICD-10-CM | POA: Diagnosis present

## 2022-08-17 DIAGNOSIS — J9811 Atelectasis: Secondary | ICD-10-CM | POA: Diagnosis present

## 2022-08-17 DIAGNOSIS — S225XXA Flail chest, initial encounter for closed fracture: Principal | ICD-10-CM | POA: Diagnosis present

## 2022-08-17 DIAGNOSIS — Z85038 Personal history of other malignant neoplasm of large intestine: Secondary | ICD-10-CM

## 2022-08-17 DIAGNOSIS — R112 Nausea with vomiting, unspecified: Secondary | ICD-10-CM | POA: Diagnosis not present

## 2022-08-17 DIAGNOSIS — E785 Hyperlipidemia, unspecified: Secondary | ICD-10-CM | POA: Diagnosis present

## 2022-08-17 DIAGNOSIS — E440 Moderate protein-calorie malnutrition: Secondary | ICD-10-CM | POA: Diagnosis not present

## 2022-08-17 DIAGNOSIS — Z4901 Encounter for fitting and adjustment of extracorporeal dialysis catheter: Secondary | ICD-10-CM | POA: Diagnosis not present

## 2022-08-17 DIAGNOSIS — Z823 Family history of stroke: Secondary | ICD-10-CM

## 2022-08-17 DIAGNOSIS — R0602 Shortness of breath: Secondary | ICD-10-CM | POA: Diagnosis not present

## 2022-08-17 DIAGNOSIS — F17210 Nicotine dependence, cigarettes, uncomplicated: Secondary | ICD-10-CM | POA: Diagnosis present

## 2022-08-17 DIAGNOSIS — Z4659 Encounter for fitting and adjustment of other gastrointestinal appliance and device: Secondary | ICD-10-CM | POA: Diagnosis not present

## 2022-08-17 DIAGNOSIS — D72829 Elevated white blood cell count, unspecified: Secondary | ICD-10-CM | POA: Diagnosis not present

## 2022-08-17 DIAGNOSIS — Z8249 Family history of ischemic heart disease and other diseases of the circulatory system: Secondary | ICD-10-CM

## 2022-08-17 DIAGNOSIS — E1129 Type 2 diabetes mellitus with other diabetic kidney complication: Secondary | ICD-10-CM | POA: Diagnosis not present

## 2022-08-17 DIAGNOSIS — Z825 Family history of asthma and other chronic lower respiratory diseases: Secondary | ICD-10-CM

## 2022-08-17 DIAGNOSIS — Z8673 Personal history of transient ischemic attack (TIA), and cerebral infarction without residual deficits: Secondary | ICD-10-CM

## 2022-08-17 DIAGNOSIS — Z79899 Other long term (current) drug therapy: Secondary | ICD-10-CM

## 2022-08-17 DIAGNOSIS — Z79891 Long term (current) use of opiate analgesic: Secondary | ICD-10-CM

## 2022-08-17 DIAGNOSIS — K921 Melena: Secondary | ICD-10-CM

## 2022-08-17 DIAGNOSIS — J690 Pneumonitis due to inhalation of food and vomit: Secondary | ICD-10-CM | POA: Diagnosis not present

## 2022-08-17 DIAGNOSIS — R410 Disorientation, unspecified: Secondary | ICD-10-CM | POA: Diagnosis not present

## 2022-08-17 DIAGNOSIS — J9691 Respiratory failure, unspecified with hypoxia: Secondary | ICD-10-CM | POA: Diagnosis not present

## 2022-08-17 DIAGNOSIS — Z905 Acquired absence of kidney: Secondary | ICD-10-CM

## 2022-08-17 DIAGNOSIS — Z7901 Long term (current) use of anticoagulants: Secondary | ICD-10-CM

## 2022-08-17 DIAGNOSIS — I120 Hypertensive chronic kidney disease with stage 5 chronic kidney disease or end stage renal disease: Secondary | ICD-10-CM | POA: Diagnosis not present

## 2022-08-17 DIAGNOSIS — N250 Renal osteodystrophy: Secondary | ICD-10-CM | POA: Diagnosis not present

## 2022-08-17 DIAGNOSIS — Z6823 Body mass index (BMI) 23.0-23.9, adult: Secondary | ICD-10-CM

## 2022-08-17 DIAGNOSIS — I739 Peripheral vascular disease, unspecified: Secondary | ICD-10-CM | POA: Diagnosis not present

## 2022-08-17 LAB — PROTIME-INR
INR: 1.2 (ref 0.8–1.2)
Prothrombin Time: 14.7 seconds (ref 11.4–15.2)

## 2022-08-17 LAB — PHOSPHORUS: Phosphorus: 6.6 mg/dL — ABNORMAL HIGH (ref 2.5–4.6)

## 2022-08-17 LAB — BASIC METABOLIC PANEL
Anion gap: 5 (ref 5–15)
BUN: 48 mg/dL — ABNORMAL HIGH (ref 8–23)
CO2: 23 mmol/L (ref 22–32)
Calcium: 7.8 mg/dL — ABNORMAL LOW (ref 8.9–10.3)
Chloride: 110 mmol/L (ref 98–111)
Creatinine, Ser: 7.42 mg/dL — ABNORMAL HIGH (ref 0.61–1.24)
GFR, Estimated: 7 mL/min — ABNORMAL LOW (ref >60)
Glucose, Bld: 96 mg/dL (ref 70–99)
Potassium: 5.1 mmol/L (ref 3.5–5.1)
Sodium: 138 mmol/L (ref 135–145)

## 2022-08-17 LAB — CBC
HCT: 28.4 % — ABNORMAL LOW (ref 39.0–52.0)
Hemoglobin: 9.1 g/dL — ABNORMAL LOW (ref 13.0–17.0)
MCH: 28.1 pg (ref 26.0–34.0)
MCHC: 32 g/dL (ref 30.0–36.0)
MCV: 87.7 fL (ref 80.0–100.0)
Platelets: 148 10*3/uL — ABNORMAL LOW (ref 150–400)
RBC: 3.24 MIL/uL — ABNORMAL LOW (ref 4.22–5.81)
RDW: 14.9 % (ref 11.5–15.5)
WBC: 11.4 10*3/uL — ABNORMAL HIGH (ref 4.0–10.5)
nRBC: 0 % (ref 0.0–0.2)

## 2022-08-17 LAB — GLUCOSE, CAPILLARY: Glucose-Capillary: 83 mg/dL (ref 70–99)

## 2022-08-17 LAB — HEPARIN LEVEL (UNFRACTIONATED): Heparin Unfractionated: <0.1 IU/mL — ABNORMAL LOW (ref 0.30–0.70)

## 2022-08-17 LAB — APTT
aPTT: 32 seconds (ref 24–36)
aPTT: 33 seconds (ref 24–36)

## 2022-08-17 LAB — LACTIC ACID, PLASMA: Lactic Acid, Venous: 0.6 mmol/L (ref 0.5–1.9)

## 2022-08-17 MED ORDER — AMIODARONE HCL 200 MG PO TABS
200.0000 mg | ORAL_TABLET | Freq: Every day | ORAL | Status: DC
  Administered 2022-08-17: 200 mg via ORAL
  Filled 2022-08-17: qty 1

## 2022-08-17 MED ORDER — AMLODIPINE BESYLATE 5 MG PO TABS
5.0000 mg | ORAL_TABLET | Freq: Every day | ORAL | Status: DC
  Administered 2022-08-18 – 2022-08-24 (×6): 5 mg via ORAL
  Filled 2022-08-17 (×6): qty 1

## 2022-08-17 MED ORDER — SEVELAMER CARBONATE 800 MG PO TABS
800.0000 mg | ORAL_TABLET | Freq: Three times a day (TID) | ORAL | Status: DC
  Administered 2022-08-17 (×2): 800 mg via ORAL
  Filled 2022-08-17 (×2): qty 1

## 2022-08-17 MED ORDER — RENA-VITE PO TABS
1.0000 | ORAL_TABLET | Freq: Every day | ORAL | Status: DC
  Filled 2022-08-17: qty 1

## 2022-08-17 MED ORDER — ONDANSETRON HCL 4 MG/2ML IJ SOLN
4.0000 mg | Freq: Four times a day (QID) | INTRAMUSCULAR | Status: DC | PRN
  Administered 2022-08-18 – 2022-08-19 (×3): 4 mg via INTRAVENOUS
  Filled 2022-08-17 (×3): qty 2

## 2022-08-17 MED ORDER — AMLODIPINE BESYLATE 5 MG PO TABS
5.0000 mg | ORAL_TABLET | Freq: Every day | ORAL | Status: DC
  Administered 2022-08-17: 5 mg via ORAL
  Filled 2022-08-17: qty 1

## 2022-08-17 MED ORDER — HYDROXYZINE HCL 25 MG PO TABS
25.0000 mg | ORAL_TABLET | Freq: Three times a day (TID) | ORAL | Status: DC
  Administered 2022-08-17 (×3): 25 mg via ORAL
  Filled 2022-08-17 (×3): qty 1

## 2022-08-17 MED ORDER — DOCUSATE SODIUM 100 MG PO CAPS
100.0000 mg | ORAL_CAPSULE | Freq: Two times a day (BID) | ORAL | Status: DC
  Administered 2022-08-17 (×2): 100 mg via ORAL
  Filled 2022-08-17 (×2): qty 1

## 2022-08-17 MED ORDER — LOSARTAN POTASSIUM 50 MG PO TABS
50.0000 mg | ORAL_TABLET | Freq: Every day | ORAL | Status: DC
  Administered 2022-08-17: 50 mg via ORAL
  Filled 2022-08-17: qty 1

## 2022-08-17 MED ORDER — LIDOCAINE 5 % EX PTCH
1.0000 | MEDICATED_PATCH | Freq: Every day | CUTANEOUS | Status: DC
  Administered 2022-08-18 – 2022-08-29 (×13): 1 via TRANSDERMAL
  Filled 2022-08-17 (×13): qty 1

## 2022-08-17 MED ORDER — HEPARIN BOLUS VIA INFUSION
3900.0000 [IU] | Freq: Once | INTRAVENOUS | Status: DC
  Filled 2022-08-17: qty 3900

## 2022-08-17 MED ORDER — DELFLEX-LC/1.5% DEXTROSE 344 MOSM/L IP SOLN: INTRAPERITONEAL | Status: DC

## 2022-08-17 MED ORDER — LIDOCAINE 5 % EX PTCH
1.0000 | MEDICATED_PATCH | CUTANEOUS | Status: DC
  Administered 2022-08-17: 1 via TRANSDERMAL
  Filled 2022-08-17 (×2): qty 1

## 2022-08-17 MED ORDER — OXYCODONE HCL ER 10 MG PO T12A
10.0000 mg | EXTENDED_RELEASE_TABLET | Freq: Two times a day (BID) | ORAL | Status: DC
  Administered 2022-08-17 (×2): 10 mg via ORAL
  Filled 2022-08-17 (×2): qty 1

## 2022-08-17 MED ORDER — LOSARTAN POTASSIUM 50 MG PO TABS
50.0000 mg | ORAL_TABLET | Freq: Every day | ORAL | Status: DC
  Administered 2022-08-18 – 2022-08-24 (×6): 50 mg via ORAL
  Filled 2022-08-17 (×6): qty 1

## 2022-08-17 MED ORDER — CLONIDINE HCL 0.1 MG/24HR TD PTWK: 0.1000 mg | MEDICATED_PATCH | TRANSDERMAL | Status: DC

## 2022-08-17 MED ORDER — BISOPROLOL FUMARATE 5 MG PO TABS
5.0000 mg | ORAL_TABLET | Freq: Two times a day (BID) | ORAL | Status: DC
  Administered 2022-08-17 (×2): 5 mg via ORAL
  Filled 2022-08-17 (×3): qty 1

## 2022-08-17 MED ORDER — DUPILUMAB 300 MG/2ML ~~LOC~~ SOSY: 300.0000 mg | PREFILLED_SYRINGE | SUBCUTANEOUS | Status: DC

## 2022-08-17 MED ORDER — ACETAMINOPHEN 325 MG PO TABS
650.0000 mg | ORAL_TABLET | Freq: Four times a day (QID) | ORAL | Status: DC | PRN
  Administered 2022-08-17 – 2022-08-18 (×2): 650 mg via ORAL
  Filled 2022-08-17 (×2): qty 2

## 2022-08-17 MED ORDER — DOXAZOSIN MESYLATE 8 MG PO TABS
8.0000 mg | ORAL_TABLET | Freq: Every day | ORAL | Status: DC
  Administered 2022-08-18 – 2022-08-30 (×11): 8 mg via ORAL
  Filled 2022-08-17 (×13): qty 1

## 2022-08-17 MED ORDER — BISOPROLOL FUMARATE 5 MG PO TABS
5.0000 mg | ORAL_TABLET | Freq: Two times a day (BID) | ORAL | Status: DC
  Administered 2022-08-18 – 2022-08-24 (×8): 5 mg via ORAL
  Filled 2022-08-17 (×15): qty 1

## 2022-08-17 MED ORDER — FLUTICASONE PROPIONATE 50 MCG/ACT NA SUSP: 2.0000 | Freq: Every day | NASAL | Status: DC | PRN

## 2022-08-17 MED ORDER — DOXAZOSIN MESYLATE 4 MG PO TABS
8.0000 mg | ORAL_TABLET | Freq: Every day | ORAL | Status: DC
  Administered 2022-08-17: 8 mg via ORAL
  Filled 2022-08-17: qty 1
  Filled 2022-08-17: qty 2

## 2022-08-17 MED ORDER — GENTAMICIN SULFATE 0.1 % EX CREA
1.0000 | TOPICAL_CREAM | Freq: Every day | CUTANEOUS | Status: DC
  Filled 2022-08-17: qty 15

## 2022-08-17 MED ORDER — NICOTINE 21 MG/24HR TD PT24
21.0000 mg | MEDICATED_PATCH | Freq: Every day | TRANSDERMAL | Status: DC
  Administered 2022-08-18 – 2022-08-30 (×13): 21 mg via TRANSDERMAL
  Filled 2022-08-17 (×13): qty 1

## 2022-08-17 MED ORDER — AMIODARONE HCL 200 MG PO TABS
200.0000 mg | ORAL_TABLET | Freq: Every day | ORAL | Status: DC
  Administered 2022-08-18 – 2022-08-30 (×11): 200 mg via ORAL
  Filled 2022-08-17 (×12): qty 1

## 2022-08-17 MED ORDER — ENOXAPARIN SODIUM 40 MG/0.4ML IJ SOSY: 40.0000 mg | PREFILLED_SYRINGE | INTRAMUSCULAR | Status: DC

## 2022-08-17 MED ORDER — MORPHINE SULFATE (PF) 2 MG/ML IV SOLN
2.0000 mg | INTRAVENOUS | Status: DC | PRN
  Administered 2022-08-18: 2 mg via INTRAVENOUS
  Filled 2022-08-17: qty 1

## 2022-08-17 MED ORDER — ALBUTEROL SULFATE (2.5 MG/3ML) 0.083% IN NEBU: 2.5000 mg | INHALATION_SOLUTION | Freq: Four times a day (QID) | RESPIRATORY_TRACT | Status: DC | PRN

## 2022-08-17 MED ORDER — LOSARTAN POTASSIUM 50 MG PO TABS: 50.0000 mg | ORAL_TABLET | Freq: Every day | ORAL | Status: DC

## 2022-08-17 MED ORDER — ACETAMINOPHEN 325 MG PO TABS: 650.0000 mg | ORAL_TABLET | ORAL | Status: DC

## 2022-08-17 MED ORDER — OXYCODONE HCL 5 MG PO TABS
5.0000 mg | ORAL_TABLET | ORAL | Status: DC | PRN
  Administered 2022-08-17: 5 mg via ORAL
  Filled 2022-08-17: qty 1

## 2022-08-17 MED ORDER — VITAMIN C 500 MG PO TABS
500.0000 mg | ORAL_TABLET | Freq: Every day | ORAL | Status: DC
  Administered 2022-08-17: 500 mg via ORAL
  Filled 2022-08-17: qty 1

## 2022-08-17 MED ORDER — SODIUM CHLORIDE 0.9% FLUSH
3.0000 mL | Freq: Two times a day (BID) | INTRAVENOUS | Status: DC
  Administered 2022-08-17 (×2): 3 mL via INTRAVENOUS

## 2022-08-17 MED ORDER — GENTAMICIN SULFATE 0.1 % EX CREA
1.0000 | TOPICAL_CREAM | Freq: Every day | CUTANEOUS | Status: DC
  Administered 2022-08-18 – 2022-08-19 (×2): 1 via TOPICAL
  Filled 2022-08-17: qty 15

## 2022-08-17 MED ORDER — FERRIC CITRATE 1 GM 210 MG(FE) PO TABS
210.0000 mg | ORAL_TABLET | Freq: Three times a day (TID) | ORAL | Status: DC
  Administered 2022-08-17 (×2): 210 mg via ORAL
  Filled 2022-08-17 (×4): qty 1

## 2022-08-17 MED ORDER — APIXABAN 5 MG PO TABS: 5.0000 mg | ORAL_TABLET | Freq: Two times a day (BID) | ORAL | Status: DC

## 2022-08-17 MED ORDER — HYDROMORPHONE HCL 1 MG/ML IJ SOLN: 0.5000 mg | INTRAMUSCULAR | Status: DC | PRN

## 2022-08-17 MED ORDER — DELFLEX-LC/2.5% DEXTROSE 394 MOSM/L IP SOLN: INTRAPERITONEAL | Status: DC

## 2022-08-17 MED ORDER — TORSEMIDE 20 MG PO TABS
100.0000 mg | ORAL_TABLET | Freq: Every day | ORAL | Status: DC
  Administered 2022-08-17: 100 mg via ORAL
  Filled 2022-08-17: qty 5

## 2022-08-17 MED ORDER — HEPARIN (PORCINE) 25000 UT/250ML-% IV SOLN: 950.0000 [IU]/h | INTRAVENOUS | Status: DC

## 2022-08-17 MED ORDER — ACETAMINOPHEN 650 MG RE SUPP: 650.0000 mg | Freq: Four times a day (QID) | RECTAL | Status: DC | PRN

## 2022-08-17 MED ORDER — ONDANSETRON HCL 4 MG PO TABS
4.0000 mg | ORAL_TABLET | Freq: Four times a day (QID) | ORAL | Status: DC | PRN
  Administered 2022-08-18: 4 mg via ORAL
  Filled 2022-08-17: qty 1

## 2022-08-17 MED ORDER — OXYCODONE HCL ER 10 MG PO T12A: 10.0000 mg | EXTENDED_RELEASE_TABLET | Freq: Two times a day (BID) | ORAL | 0 refills | Status: DC

## 2022-08-17 MED ORDER — CELECOXIB 200 MG PO CAPS
200.0000 mg | ORAL_CAPSULE | Freq: Two times a day (BID) | ORAL | Status: DC
  Administered 2022-08-17 (×2): 200 mg via ORAL
  Filled 2022-08-17 (×3): qty 1

## 2022-08-17 MED ORDER — SEVELAMER CARBONATE 800 MG PO TABS
800.0000 mg | ORAL_TABLET | Freq: Three times a day (TID) | ORAL | Status: DC
  Administered 2022-08-18 – 2022-08-21 (×7): 800 mg via ORAL
  Filled 2022-08-17 (×7): qty 1

## 2022-08-17 MED ORDER — ACETAMINOPHEN 325 MG PO TABS
650.0000 mg | ORAL_TABLET | ORAL | Status: DC
  Administered 2022-08-17 (×4): 650 mg via ORAL
  Filled 2022-08-17 (×4): qty 2

## 2022-08-17 MED ORDER — LIDOCAINE 5 % EX PTCH: 1.0000 | MEDICATED_PATCH | CUTANEOUS | 0 refills | Status: DC

## 2022-08-17 MED ORDER — CLONIDINE HCL 0.1 MG/24HR TD PTWK
0.1000 mg | MEDICATED_PATCH | TRANSDERMAL | Status: DC
  Administered 2022-08-20: 0.1 mg via TRANSDERMAL
  Filled 2022-08-17: qty 1

## 2022-08-17 MED ORDER — CELECOXIB 200 MG PO CAPS: 200.0000 mg | ORAL_CAPSULE | Freq: Two times a day (BID) | ORAL | Status: DC

## 2022-08-17 MED ORDER — LACTULOSE 10 GM/15ML PO SOLN: 20.0000 g | Freq: Every day | ORAL | Status: DC | PRN

## 2022-08-17 MED ORDER — HYDROMORPHONE HCL 1 MG/ML IJ SOLN: 0.5000 mg | INTRAMUSCULAR | 0 refills | Status: DC | PRN

## 2022-08-17 MED ORDER — CLONIDINE HCL 0.1 MG PO TABS
0.2000 mg | ORAL_TABLET | Freq: Every day | ORAL | Status: DC
  Administered 2022-08-17: 0.2 mg via ORAL
  Filled 2022-08-17: qty 2

## 2022-08-17 MED ORDER — TORSEMIDE 20 MG PO TABS: 100.0000 mg | ORAL_TABLET | Freq: Every day | ORAL | Status: DC

## 2022-08-17 NOTE — H&P (Signed)
History and Physical    [name] [id number] DOB: 10/30/1945 DOA: 08/17/2022  PCP: Crecencio Mc, MD   Chief Complaint: cp  HPI: [name] is a 77 y.o. male with medical history significant of CHF, A-fib, ESRD on peritoneal dialysis, hypertension, hyperlipidemia, prior stroke presented to the emergency department with gait disturbances and leg weakness for 6 months.  Patient works as a Chief Strategy Officer and this afternoon was at a site when he fell on a metal barrel hitting the right side of his chest.  Since he has had severe pain with breathing.  He presented to the ER where he had mild hypoxia requiring 2 L of oxygen.  CT chest was obtained which showed nondisplaced fractures of left seventh and eighth rib.  He was also was found to have free air in the abdomen.  CT abdomen pelvis showed persistent extensive pneumoperitoneum in setting of peritoneal dialysis catheter.  He was also found to have a 6 cm cystic lesion concerning for possible hemorrhagic cyst.  He remained hemodynamically stable.  General surgery evaluated him and recommended transfer for trauma evaluation.  On arrival to [name] was consulted and will assess the patient.  Nephrology was also consulted regarding peritoneal dialysis.  On evaluation patient's abdomen is entirely benign soft nontender nondistended.  He is typically on Eliquis for anticoagulation but have held on admission due to hemorrhagic cysts.   Review of Systems: Review of Systems  All other systems reviewed and are negative.    As per HPI otherwise 10 point review of systems negative.   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 12/2018 Echo: EF 40-45%, global HK. Asc Ao 3.7cm; b. 01/2019 Lexi MV: small, mild, fixed basal and mid antlat defect - scar vs artifact. Small, mild mid and apical inf minimally reversible defect, likely scar w/ peri-infarct ischemia. Coronary and Ao atherosclerosis; c. 12/2019 Echo: EF 40-45%, glob HK, Gr1 DD. Nl RV fxn. Sev dil LA. Mild MR. Mild-mod Ao sclerosis w/o stenosis. Asc Ao 74mm.   Carotid arterial disease ([place])    a. 01/2020 RICA 1-39%, RCCA/RECA < [id number], LICA [id number], LCCA/LECA <50%.   Claudication ([place])    a. 02/2020 ABI/TBI: R 1.08/0.95, L 1.00/0.70.   Complication of anesthesia    had to be woken up slowly as his bp was elevated when did this quickly   Diabetes mellitus without complication (HCC)    Dysplastic nevus 02/07/2013   L upper back, moderate atypia   ESRD (end stage renal disease) ([place])    a. Peritoneal Dialysis pt.   Hyperlipidemia    Hypertension    Migraine    cluster   Neuromuscular disorder (HCC)    left lower extrem neuropathy   Obstructive sleep apnea    no OSA since had facial surgery with  dr. Kathyrn Sheriff in 1997   PAD (peripheral artery disease) ([place]) 06/2007   nonobstructing, renal angiogram ([place])   Renal cell carcinoma 2004   left kidney heminephrectomy   Renal insufficiency    Sepsis ([place]) 11/06/2020   Skin cancer    R dorsum hand   Stroke [facility])    a. 12/2019 MRI: Small acute infarcts involving the left cerebral hemisphere and several chronic infarcts.   TIA (transient ischemic attack)    no residual but left leg and foot still feel heavy   tobacco abuse     Past Surgical History:  Procedure Laterality Date   CAPD INSERTION N/A 03/01/2019   Procedure:  LAPAROSCOPIC INSERTION CONTINUOUS AMBULATORY PERITONEAL DIALYSIS  (CAPD) CATHETER;  Surgeon: Algernon Huxley, MD;  Location: [facility] ORS;  Service: Vascular;  Laterality: N/A;   CARDIAC CATHETERIZATION     Dr. [name] did this to assess his renal artery   cyst removal  12/25/2015   Spine L4 and L5   heminephrectomy  2004   for renal cell CA   RENAL CRYOABLATION  Jan 2006   right kidney,  [facility]   RIGHT HEART CATH AND CORONARY ANGIOGRAPHY Bilateral 06/29/2022   Procedure: RIGHT HEART CATH AND CORONARY ANGIOGRAPHY;  Surgeon: [name], MD;  Location: [facility];  Service: Cardiovascular;  Laterality: Bilateral;   sciatica       reports that he has been smoking cigarettes. He has a 50.00 pack-year smoking history. He has never used smokeless tobacco. He reports that he does not currently use alcohol. He reports that he does not use drugs.  Family History  Problem Relation Age of Onset   Hypertension Mother    Cancer Mother        breast   Aneurysm Mother    Coronary artery disease Father    Hypertension Father    Stroke Father 87   Heart disease Father    Heart attack Father 48   Aneurysm Maternal Grandmother        brain   Aneurysm Paternal Grandmother        brain   Coronary artery disease Paternal Grandfather    Heart disease Brother        valvular heart disease   COPD Brother    Hypertension Brother    Stroke Paternal Uncle     Prior to Admission medications   Medication Sig Start Date End Date Taking? Authorizing Provider  acetaminophen (TYLENOL) 325 MG tablet Take 2 tablets (650 mg total) by mouth every 4 (four) hours. 08/17/22   Lorella Nimrod, MD  albuterol (VENTOLIN HFA) 108 (90 Base) MCG/ACT inhaler Inhale 2 puffs into the lungs every 6 (six) hours as needed for wheezing or shortness of breath. 05/04/22   End, Harrell Gave, MD  amiodarone (PACERONE) 200 MG tablet Take 1 tablet (200 mg total) by mouth daily. 07/16/22   Gerrie Nordmann, NP  amLODipine (NORVASC) 5  MG tablet Take 1 tablet (5 mg total) by mouth daily. 06/29/22 06/29/23  End, Harrell Gave, MD  Ascorbic Acid 500 MG CHEW Chew 1 tablet by mouth daily.    [provider]  b complex vitamins capsule Take 1 capsule by mouth daily.    [provider]  bisoprolol (ZEBETA) 5 MG tablet Take 1 tablet (5 mg total) by mouth 2 (two) times daily. 01/18/22   Emeterio Reeve, DO  celecoxib (CELEBREX) 200 MG capsule Take 1 capsule (200 mg total) by mouth 2 (two) times daily. 08/17/22   Lorella Nimrod,  MD  cloNIDine (CATAPRES - DOSED IN MG/24 HR) 0.1 mg/24hr patch Place 1 patch (0.1 mg total) onto the skin every Friday. 03/27/21   Vickie Epley, MD  cloNIDine (CATAPRES) 0.2 MG tablet Take 0.2 mg by mouth at bedtime. 03/05/22   [provider]  doxazosin (CARDURA) 8 MG tablet Take 8 mg by mouth daily. 03/29/20   [provider]  dupilumab (DUPIXENT) 300 MG/2ML prefilled syringe Inject 300 mg into the skin every 14 (fourteen) days. Starting at day 15 for maintenance. 08/03/22   Ralene Bathe, MD  ELIQUIS 5 MG TABS tablet TAKE 1 TABLET BY MOUTH TWICE DAILY 05/13/22   End, Harrell Gave, MD  ferric citrate (AURYXIA) 1 GM 210 MG(Fe) tablet Take 1 tablet by mouth 3 (three) times daily. 07/13/19   [provider]  fluticasone (FLONASE) 50 MCG/ACT nasal spray Place 2 sprays into both nostrils daily as needed for allergies or rhinitis.    [provider]  gentamicin cream (GARAMYCIN) 0.1 % Apply 1 application topically daily. 11/07/20   Fritzi Mandes, MD  HYDROmorphone (DILAUDID) 1 MG/ML injection Inject 0.5 mLs (0.5 mg total) into the vein every 2 (two) hours as needed for moderate pain or severe pain. 08/17/22   Lorella Nimrod, MD  hydrOXYzine (ATARAX) 25 MG tablet Take 25 mg by mouth 3 (three) times daily. 12/29/21   [provider]  ketoconazole (NIZORAL) 2 % cream Apply 1 Application topically at bedtime. Qhs to feet for scaly rash on feet 05/26/22   Ralene Bathe,  MD  lactulose [facility]) 10 GM/15ML solution Take 15-30 mL by mouth daily as needed for constipation 06/25/22   End, Harrell Gave, MD  lidocaine (LIDODERM) 5 % Place 1 patch onto the skin daily. Remove & Discard patch within 12 hours or as directed by MD 08/18/22   Lorella Nimrod, MD  losartan (COZAAR) 50 MG tablet Take 1 tablet (50 mg total) by mouth daily. 08/18/22   Lorella Nimrod, MD  multivitamin (RENA-VIT) TABS tablet Take 1 tablet by mouth at bedtime. 12/21/19   [provider]  oxyCODONE (OXYCONTIN) 10 mg 12 hr tablet Take 1 tablet (10 mg total) by mouth every 12 (twelve) hours. 08/17/22   Lorella Nimrod, MD  sevelamer (RENAGEL) 800 MG tablet Take 800 mg by mouth 3 (three) times daily with meals.    [provider]  tacrolimus (PROTOPIC) 0.1 % ointment Apply topically 2 (two) times daily. Bid to itchy rash on body 05/26/22   Ralene Bathe, MD  torsemide [facility]) 100 MG tablet Take 100 mg by mouth daily.    [provider]  triamcinolone cream (KENALOG) 0.1 % Apply 1 Application topically as needed (itching). 04/13/22   [provider]    Physical Exam: There were no vitals filed for this visit. Physical Exam Constitutional:      Appearance: He is normal weight.  HENT:     Head: Normocephalic.     Nose: Nose normal.     Mouth/Throat:     Mouth: Mucous membranes are moist.     Pharynx: Oropharynx is clear.  Eyes:     Pupils: Pupils are equal, round, and reactive to light.  Cardiovascular:     Rate and Rhythm: Normal rate.     Pulses: Normal pulses.     Heart sounds: Normal heart sounds.  Pulmonary:     Effort: Pulmonary effort is normal.     Breath sounds: Normal breath sounds.  Abdominal:     General: Abdomen  is flat. Bowel sounds are normal.  Musculoskeletal:        General: Normal range of motion.     Cervical back: Normal range of motion.  Skin:    General: Skin is warm.     Capillary Refill: Capillary refill takes less than 2 seconds.   Neurological:     General: No focal deficit present.     Mental Status: He is alert.        Labs on Admission: I have personally reviewed the patients's labs and imaging studies.  Assessment/Plan # Rib fractures, POA, active - There was concern for flail chest however patient likely has rib fractures in setting of fall -Pleuritic chest pain Plan: Apply lidocaine patch to areas of fracture Consult trauma  6 cm hemorrhagic renal cyst-will hold anticoagulation and continue to monitor  Afib/flutter- continue amiodarone, hold eliquis  ESRD on PD- nephro consulted for PD. Continue renvela  HTN- continue amlodipine, bisoprolol, clonidine, doxazosin    Admission status: Inpatient Telemetry Medical  Certification: The appropriate patient status for this patient is INPATIENT. Inpatient status is judged to be reasonable and necessary in order to provide the required intensity of service to ensure the patient's safety. The patient's presenting symptoms, physical exam findings, and initial radiographic and laboratory data in the context of their chronic comorbidities is felt to place them at high risk for further clinical deterioration. Furthermore, it is not anticipated that the patient will be medically stable for discharge from the hospital within 2 midnights of admission.   * I certify that at the point of admission it is my clinical judgment that the patient will require inpatient hospital care spanning beyond 2 midnights from the point of admission due to high intensity of service, high risk for further deterioration and high frequency of surveillance required.Emilee Hero MD [name] If 7PM-7AM, please contact night-coverage [email or web address]  08/17/2022, 11:20 PM

## 2022-08-17 NOTE — Progress Notes (Signed)
ANTICOAGULATION CONSULT NOTE - Initial Consult  Pharmacy Consult for Heparin  Indication: atrial fibrillation and VTE prophylaxis  Allergies  Allergen Reactions   Irbesartan Other (See Comments)    hyperkalemia   Cymbalta [Duloxetine Hcl]    Hydralazine Other (See Comments)    headache   Imdur [Isosorbide Nitrate] Other (See Comments)    headache   Rosuvastatin     Breast swelling/soreness   Atorvastatin Other (See Comments)    Muscle pain   Bystolic [Nebivolol Hcl]     Extreme fatigue     Patient Measurements: Height: 5\' 6"  (167.6 cm) Weight: 64.9 kg (143 lb) IBW/kg (Calculated) : 63.8 Heparin Dosing Weight: 64.9 kg   Vital Signs: Temp: 97.5 F (36.4 C) (03/25 1539) Temp Source: Oral (03/25 1539) BP: 187/69 (03/26 0000) Pulse Rate: 62 (03/26 0000)  Labs: Recent Labs    08/16/22 2116  HGB 9.9*  HCT 30.2*  PLT 165  CREATININE 7.41*    Estimated Creatinine Clearance: 7.7 mL/min (A) (by C-G formula based on SCr of 7.41 mg/dL (H)).   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 12/2018 Echo: EF 40-45%, global HK. Asc Ao 3.7cm; b. 01/2019 Lexi MV: small, mild, fixed basal and mid antlat defect - scar vs artifact. Small, mild mid and apical inf minimally reversible defect, likely scar w/ peri-infarct ischemia. Coronary and Ao atherosclerosis; c. 12/2019 Echo: EF 40-45%, glob HK, Gr1 DD. Nl RV fxn. Sev dil LA. Mild MR. Mild-mod Ao sclerosis w/o stenosis. Asc Ao 1mm.   Carotid arterial disease ([place])    a. 01/2020 RICA 1-39%, RCCA/RECA < A999333, LICA [id number], LCCA/LECA <50%.   Claudication ([place])    a. 02/2020 ABI/TBI: R 1.08/0.95, L 1.00/0.70.   Complication of anesthesia    had to be woken up slowly as his bp was elevated when  did this quickly   Diabetes mellitus without complication (HCC)    Dysplastic nevus 02/07/2013   L upper back, moderate atypia   ESRD (end stage renal disease) ([place])    a. Peritoneal Dialysis pt.   Hyperlipidemia    Hypertension    Migraine    cluster   Neuromuscular disorder ([place])    left lower extrem neuropathy   Obstructive sleep apnea    no OSA since had facial surgery with dr. Kathyrn Sheriff in 1997   PAD (peripheral artery disease) ([place]) 06/2007   nonobstructing, renal angiogram ([place])   Renal cell carcinoma 2004   left kidney heminephrectomy   Renal insufficiency    Sepsis ([place]) 11/06/2020   Skin cancer    R dorsum hand   Stroke ([place])    a. 12/2019 MRI: Small acute infarcts involving the left cerebral hemisphere and several chronic infarcts.   TIA (transient ischemic attack)    no residual but left leg and foot still feel heavy   tobacco abuse     Medications:  (Not in a hospital admission)   Assessment: Pharmacy consulted to dose heparin for Afib, VTE prophylaxis in this 77 year old male admitted with fractured rib from work accident.  Pt was on Eliquis 5 mg PO BID PTA, last dose on [id number] uncertain time.  Per MD, will start heparin on 3/26 AM due to concern for possible bleeding.  CrCl =  7.7 ml/min   Goal of Therapy:  Heparin level 0.3-0.7 units/ml aPTT 66 - 102  seconds Monitor platelets by anticoagulation protocol: Yes   Plan:  Per MD request, will begin heparin on 3/26 AM due to concern for bleeding.  Will order heparin 3900 units IV X 1 bolus followed by heparin infusion to run at 950 units/hr to start 3/26 @ 1000. Will use aPTT to guide dosing until  HL and aPTT are therapeutic.  Will check aPTT and HL 8 hrs after start of drip. CBC daily.   [name] 08/17/2022,12:07 AM

## 2022-08-17 NOTE — Discharge Summary (Signed)
Physician Discharge Summary   Patient: [name] MRN: [id number] DOB: [birth year]  Admit date:     08/16/2022  Discharge date: 08/17/22  Discharge Physician: Lorella Nimrod   PCP: Crecencio Mc, MD   Recommendations at discharge:   Patient is being transferred to [facility]. Patient is on peritoneal dialysis-please consult nephrology to continue Please consult trauma for flail chest  Discharge Diagnoses: Principal Problem:   Flail chest Active Problems:   CKD (chronic kidney disease), stage V (HCC)   Atrial fibrillation and flutter (HCC)   Acute respiratory failure with hypoxia (HCC)   Pain   Rib fracture   Gait disturbance   Hepatomegaly   Renal cyst, native, hemorrhage   Hospital Course: Taken from H&P.   [name] is a 77 y.o. male with medical history significant of ESRD on PD, atrial fibrillation and flutter on Eliquis, progressive gait disturbance/leg weakness for about 6 months duration as per the patient.  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  At approximately 2 PM this afternoon, patient was at a worksite and trying to get onto a platform about hip height, unfortunately patient lost his footing while trying to get up and fell onto his right side onto 50 gallon drum hitting the right anterior chest at the edge of the drum.  Followed by severe pain at the site especially with breathing.  Patient denies any presyncope or vertigo or loss of consciousness or any new focal weakness.   ED course.  Mostly stable vitals except some hypoxia requiring up to 2 L of oxygen and significant chest pain.  CT chest with concern of non-displaced fracture of left lateral seventh and eighth and a possible fracture of 12th ribs. Also noted to have free air in the abdomen with a small amount of free fluid, likely indicating bowel perforation.  Suggesting CT abdomen and pelvis which was obtained which also shows  persistent extensive pneumoperitoneum which is more than usual in the setting of PD catheter.  Patient has diffuse colonic diverticulosis but no sign of diverticulitis or other inflammatory changes suggesting bowel perforation.  Also noted to have a 6.4 cm left cystic lesion which was suggestive of hemorrhagic cyst-recommending short-term follow-up ultrasound.  3/26: Vitals with elevated blood pressure at 172/60.  Patient is on multiple antihypertensives which were continued.  Continue to have significant pain and paradoxical chest wall movements on exam.  Asked general surgery to evaluate him and they were concerned that he should be in a place with trauma team is available.  Discussed with Dr. Grandville Silos from trauma team at [facility], he does not think that he should be coming under trauma team but agreed to do consultation if stays under hospitalist services. Nephrology was also consulted to continue with peritoneal dialysis.  Pneumoperitoneum is most likely due to PD catheter as his abdominal exam was benign.  No concern of any visceral perforation at this time.  His anticoagulation is currently on hold.  Can be resumed in a day or so if remained stable.  Patient is being transferred to [facility] under [facility] and trauma team should be consulted to stay on board.    Assessment and Plan: * Flail chest We will use positive airway pressure to improve patient comfort and minimize pain from chest flailing.  At this time the area of chest that was noted to be retracting with inspiration was less than 5 cm x  4 cm.  At the lower costal margin anteriorly on the left.  I really do feel that conservative management with positive airway pressure, and pain control and continuous monitoring is the best at this time.  Renal cyst, native, hemorrhage Dental finding on CAT scan, repeat radiography as per radiologist.  Hepatomegaly Physical exam finding.  No abnormality reported on  abdominal CT, outpatient follow-up  Gait disturbance This is chronic, patient will need physical therapy evaluation soon.  Rib fracture Displaced, traumatic.  Managing conservatively at this time no skin changes post.  Fortunately no evidence of big hemothorax or pneumothorax  Pain Multimodal pain regimen has been ordered for this patient.  For comfort.  Acute respiratory failure with hypoxia ([place]) I believe this is due to patient having chest flailing and splinting.  We will continue with oxygen as needed, monitor with pulse oximetry, turn position to the right side to keep good lung down.  And pulmonary toilet.  Atrial fibrillation and flutter ([place]) Continue with amiodarone, at this time I will hold apixaban until we have established clinical stability for this patient.  CKD (chronic kidney disease), stage V ([place]) I have put in a staff message for Dr. Candiss Norse to kindly evaluate patient in the morning for peritoneal dialysis.  Please follow-up with him accordingly   Consultants: General surgery.  Nephrology Procedures performed: None Disposition:  Transfer to [facility] Diet recommendation:  Discharge Diet Orders (From admission, onward)     Start     Ordered   08/17/22 0000  Diet - low sodium heart healthy        08/17/22 1520           Cardiac diet DISCHARGE MEDICATION: Allergies as of 08/17/2022       Reactions   Irbesartan Other (See Comments)   hyperkalemia   Cymbalta [duloxetine Hcl]    Hydralazine Other (See Comments)   headache   Imdur [isosorbide Nitrate] Other (See Comments)   headache   Rosuvastatin    Breast swelling/soreness   Atorvastatin Other (See Comments)   Muscle pain   Bystolic [nebivolol Hcl]    Extreme fatigue         Medication List     TAKE these medications    acetaminophen 325 MG tablet Commonly known as: TYLENOL Take 2 tablets (650 mg total) by mouth every 4 (four) hours.   albuterol 108 (90 Base) MCG/ACT  inhaler Commonly known as: VENTOLIN HFA Inhale 2 puffs into the lungs every 6 (six) hours as needed for wheezing or shortness of breath.   amiodarone 200 MG tablet Commonly known as: PACERONE Take 1 tablet (200 mg total) by mouth daily.   amLODipine 5 MG tablet Commonly known as: NORVASC Take 1 tablet (5 mg total) by mouth daily.   Ascorbic Acid 500 MG Chew Chew 1 tablet by mouth daily.   b complex vitamins capsule Take 1 capsule by mouth daily.   bisoprolol 5 MG tablet Commonly known as: ZEBETA Take 1 tablet (5 mg total) by mouth 2 (two) times daily.   celecoxib 200 MG capsule Commonly known as: CELEBREX Take 1 capsule (200 mg total) by mouth 2 (two) times daily.   cloNIDine 0.1 mg/24hr patch Commonly known as: CATAPRES - Dosed in mg/24 hr Place 1 patch (0.1 mg total) onto the skin every Friday.   cloNIDine 0.2 MG tablet Commonly known as: CATAPRES Take 0.2 mg by mouth at bedtime.   doxazosin 8 MG tablet Commonly known as: CARDURA Take 8  mg by mouth daily.   Dupixent 300 MG/2ML prefilled syringe Generic drug: dupilumab Inject 300 mg into the skin every 14 (fourteen) days. Starting at day 15 for maintenance.   Eliquis 5 MG Tabs tablet Generic drug: apixaban TAKE 1 TABLET BY MOUTH TWICE DAILY   ferric citrate 1 GM 210 MG(Fe) tablet Commonly known as: AURYXIA Take 1 tablet by mouth 3 (three) times daily.   fluticasone 50 MCG/ACT nasal spray Commonly known as: FLONASE Place 2 sprays into both nostrils daily as needed for allergies or rhinitis.   gentamicin cream 0.1 % Commonly known as: GARAMYCIN Apply 1 application topically daily.   HYDROmorphone 1 MG/ML injection Commonly known as: DILAUDID Inject 0.5 mLs (0.5 mg total) into the vein every 2 (two) hours as needed for moderate pain or severe pain.   hydrOXYzine 25 MG tablet Commonly known as: ATARAX Take 25 mg by mouth 3 (three) times daily.   ketoconazole 2 % cream Commonly known as: NIZORAL Apply 1  Application topically at bedtime. Qhs to feet for scaly rash on feet   lactulose 10 GM/15ML solution Commonly known as: CHRONULAC Take 15-30 mL by mouth daily as needed for constipation   lidocaine 5 % Commonly known as: LIDODERM Place 1 patch onto the skin daily. Remove & Discard patch within 12 hours or as directed by MD Start taking on: August 18, 2022   losartan 50 MG tablet Commonly known as: COZAAR Take 1 tablet (50 mg total) by mouth daily. Start taking on: August 18, 2022 What changed:  medication strength See the new instructions.   multivitamin Tabs tablet Take 1 tablet by mouth at bedtime.   oxyCODONE 10 mg 12 hr tablet Commonly known as: OXYCONTIN Take 1 tablet (10 mg total) by mouth every 12 (twelve) hours.   sevelamer 800 MG tablet Commonly known as: RENAGEL Take 800 mg by mouth 3 (three) times daily with meals.   tacrolimus 0.1 % ointment Commonly known as: PROTOPIC Apply topically 2 (two) times daily. Bid to itchy rash on body   torsemide 100 MG tablet Commonly known as: DEMADEX Take 100 mg by mouth daily.   triamcinolone cream 0.1 % Commonly known as: KENALOG Apply 1 Application topically as needed (itching).        Discharge Exam: Filed Weights   08/16/22 1540 08/17/22 0039  Weight: 64.9 kg 67.5 kg   General.     In no acute distress. Pulmonary.  Lungs clear bilaterally, normal respiratory effort.  Paradoxical chest wall movement. CV.  Regular rate and rhythm, no JVD, rub or murmur. Abdomen.  Soft, nontender, nondistended, BS positive. CNS.  Alert and oriented .  No focal neurologic deficit. Extremities.  No edema, no cyanosis, pulses intact and symmetrical. Psychiatry.  Judgment and insight appears normal.   Condition at discharge: stable  The results of significant diagnostics from this hospitalization (including imaging, microbiology, ancillary and laboratory) are listed below for reference.   Imaging Studies: CT Abdomen Pelvis W  Contrast  Result Date: 08/16/2022 CLINICAL DATA:  Abdominal pain, acute, nonlocalized LUQ pain s/p fall, free air seen on CT chest Pt to ED for fall 4 ft onto diesel fuel can, hitting left side. Pt unable to stand up or ambulate, holding left side, grunting EXAM: CT ABDOMEN AND PELVIS WITH CONTRAST TECHNIQUE: Multidetector CT imaging of the abdomen and pelvis was performed using the standard protocol following bolus administration of intravenous contrast. RADIATION DOSE REDUCTION: This exam was performed according to the departmental dose-optimization program which  includes automated exposure control, adjustment of the mA and/or kV according to patient size and/or use of iterative reconstruction technique. CONTRAST:  121mL OMNIPAQUE IOHEXOL 300 MG/ML  SOLN COMPARISON:  CT chest 08/16/2022, CT abdomen pelvis 03/25/2022 FINDINGS: Lower chest: Bronchial wall thickening. Bilateral lower lobe subsegmental atelectasis. Bilateral trace pleural effusions. Aortic valve leaflet and coronary artery calcification. Cardiomegaly. Hepatobiliary: No focal liver abnormality. No gallstones, gallbladder wall thickening, or pericholecystic fluid. No biliary dilatation. Pancreas: No focal lesion. Normal pancreatic contour. No surrounding inflammatory changes. No main pancreatic ductal dilatation. Spleen: Normal in size without focal abnormality.  Spleen noted. Adrenals/Urinary Tract: No adrenal nodule bilaterally. Multiple fluid density lesions within the kidneys suggestive of simple renal cysts. Simple renal cysts, in the absence of clinically indicated signs/symptoms, require no independent follow-up. There is a 6.4 cm left cystic lesion with interval development of layering hyperdensity of 56 Hounsfield units suggestive of a hemorrhagic cyst. No hydronephrosis. No hydroureter. The urinary bladder is unremarkable. On delayed imaging, there is no excretion of intravenous contrast from either kidneys. Stomach/Bowel: Stomach is within  normal limits. No evidence of bowel wall thickening or dilatation. Diffuse colonic diverticulosis. Appendix appears normal. Vascular/Lymphatic: No abdominal aorta or iliac aneurysm. Extensive calcified and noncalcified atherosclerotic plaque of the aorta and its branche with diminutive lumen of the left common iliac artery down to 4 mm. Diminutive lumen of the infra-abdominal proximal abdominal aorta with caliber of 7 mm. No abdominal, pelvic, or inguinal lymphadenopathy. Reproductive: The prostate is enlarged measuring up to 4.9 cm. Other: Peritoneal dialysis catheter with tip terminating within the right pelvis. Trace free fluid within the upper abdomen and pelvis. Persistent moderate to large volume free intraperitoneal gas within the abdomen and pelvis. No organized fluid collection. No mesenteric fat stranding. Musculoskeletal: No chest wall abnormality. No suspicious lytic or blastic osseous lesions. No acute displaced fracture. Multilevel degenerative changes of the spine. IMPRESSION: 1. Persistent extensive pneumoperitoneum which is more than usual in the setting of a peritoneal dialysis catheter. Given patient has diffuse colonic diverticulosis, finding could be due to a peripheral diverticula; however, no acute diverticulitis identified. No findings of inflammatory changes or other to suggest bowel perforation. 2. A 6.4 cm left cystic lesion with interval development of layering hyperdensity suggestive of a hemorrhagic cyst. Recommend short-term follow-up ultrasound for further evaluation. 3. Colonic diverticulosis with no acute diverticulitis. 4. Bilateral trace pleural effusion and trace simple free fluid ascites. 5.  Aortic Atherosclerosis (ICD10-I70.0) - extensive. 6. Please see separately dictated CT chest 08/15/2023. Electronically Signed   By: Iven Finn M.D.   On: 08/16/2022 21:30   CT Chest Wo Contrast  Result Date: 08/16/2022 CLINICAL DATA:  Chest trauma after a fall. Left anterior lower  chest wall pain. EXAM: CT CHEST WITHOUT CONTRAST TECHNIQUE: Multidetector CT imaging of the chest was performed following the standard protocol without IV contrast. RADIATION DOSE REDUCTION: This exam was performed according to the departmental dose-optimization program which includes automated exposure control, adjustment of the mA and/or kV according to patient size and/or use of iterative reconstruction technique. COMPARISON:  Chest radiograph 08/16/2022 FINDINGS: Cardiovascular: Cardiac enlargement. Small pericardial effusion. Diffuse calcification of the aorta and great vessels as well as the coronary arteries. No aortic aneurysm. Mediastinum/Nodes: Thyroid gland is unremarkable. Esophagus is decompressed. Mediastinal lymph nodes are not pathologically enlarged, likely reactive. Lungs/Pleura: Trace bilateral pleural effusions with basilar atelectasis bilaterally. Emphysematous changes in the lungs. Interstitial septal thickening with bronchial wall thickening likely representing chronic bronchitic change. No focal consolidation.  No pneumothorax. Upper Abdomen: Free intraperitoneal air is demonstrated in the upper abdomen. In the absence of recent surgery, this is suspicious for bowel perforation. Heterogeneous enlargement and heterogeneous density of the left kidney suggest renal laceration and hematoma. Small amount of free fluid in the upper abdomen. Completion CT of the abdomen and pelvis with contrast is suggested. Musculoskeletal: Nondisplaced fractures of the left lateral seventh and eighth rib. Possible nondisplaced fracture of the left twelfth rib. Degenerative changes in the spine. No vertebral compression. IMPRESSION: 1. Nondisplaced fractures of the left lateral seventh and eighth ribs as well as possible fracture of the left twelfth rib. 2. Free air in the abdomen with small amount of free fluid likely indicating bowel perforation. 3. Heterogeneous enlargement and density in the left kidney  suggesting renal laceration with hematoma. 4. Completion CT of the abdomen and pelvis with contrast is suggested. 5. Trace bilateral pleural effusions. Emphysematous changes and fibrosis with bronchitic changes in the lungs. 6. Cardiac enlargement with small pericardial effusion. Critical Value/emergent results were called by telephone at the time of interpretation on 08/16/2022 at 8:27 pm to provider [facility] , who verbally acknowledged these results. Electronically Signed   By: Lucienne Capers M.D.   On: 08/16/2022 20:35   DG Chest Port 1 View  Result Date: 08/16/2022 CLINICAL DATA:  Fall EXAM: PORTABLE CHEST 1 VIEW COMPARISON:  Chest x-ray dated January 07, 2022 FINDINGS: Unchanged cardiomegaly. Mild diffuse interstitial opacities. No consolidation. Skin folds overlying the left hemithorax. No evidence of pleural effusion or pneumothorax. IMPRESSION: 1. No focal consolidation. 2. Mild diffuse interstitial opacities, likely due to pulmonary edema. Electronically Signed   By: Yetta Glassman M.D.   On: 08/16/2022 16:05    Microbiology: Results for orders placed or performed during the hospital encounter of 04/01/21  Resp Panel by RT-PCR (Flu A&B, Covid) Nasopharyngeal Swab     Status: None   Collection Time: 04/01/21 11:28 AM   Specimen: Nasopharyngeal Swab; Nasopharyngeal(NP) swabs in vial transport medium  Result Value Ref Range Status   SARS Coronavirus 2 by RT PCR NEGATIVE NEGATIVE Final    Comment: (NOTE) SARS-CoV-2 target nucleic acids are NOT DETECTED.  The SARS-CoV-2 RNA is generally detectable in upper respiratory specimens during the acute phase of infection. The lowest concentration of SARS-CoV-2 viral copies this assay can detect is 138 copies/mL. A negative result does not preclude SARS-Cov-2 infection and should not be used as the sole basis for treatment or other patient management decisions. A negative result may occur with  improper specimen collection/handling, submission  of specimen other than nasopharyngeal swab, presence of viral mutation(s) within the areas targeted by this assay, and inadequate number of viral copies(<138 copies/mL). A negative result must be combined with clinical observations, patient history, and epidemiological information. The expected result is Negative.  Fact Sheet for Patients:  EntrepreneurPulse.com.au  Fact Sheet for Healthcare Providers:  IncredibleEmployment.be  This test is no t yet approved or cleared by the [place] FDA and  has been authorized for detection and/or diagnosis of SARS-CoV-2 by FDA under an Emergency Use Authorization (EUA). This EUA will remain  in effect (meaning this test can be used) for the duration of the COVID-19 declaration under Section 564(b)(1) of the Act, 21 U.S.C.section 360bbb-3(b)(1), unless the authorization is terminated  or revoked sooner.       Influenza A by PCR NEGATIVE NEGATIVE Final   Influenza B by PCR NEGATIVE NEGATIVE Final    Comment: (NOTE) The Xpert Xpress SARS-CoV-2/FLU/RSV  plus assay is intended as an aid in the diagnosis of influenza from Nasopharyngeal swab specimens and should not be used as a sole basis for treatment. Nasal washings and aspirates are unacceptable for Xpert Xpress SARS-CoV-2/FLU/RSV testing.  Fact Sheet for Patients: EntrepreneurPulse.com.au  Fact Sheet for Healthcare Providers: IncredibleEmployment.be  This test is not yet approved or cleared by the [place] FDA and has been authorized for detection and/or diagnosis of SARS-CoV-2 by FDA under an Emergency Use Authorization (EUA). This EUA will remain in effect (meaning this test can be used) for the duration of the COVID-19 declaration under Section 564(b)(1) of the Act, 21 U.S.C. section 360bbb-3(b)(1), unless the authorization is terminated or revoked.  Performed at [facility], [address] [place], [place] [address]     Labs: CBC: Recent Labs  Lab 08/16/22 2116 08/17/22 0436  WBC 12.9* 11.4*  NEUTROABS 10.2*  --   HGB 9.9* 9.1*  HCT 30.2* 28.4*  MCV 88.3 87.7  PLT 165 123456*   Basic Metabolic Panel: Recent Labs  Lab 08/16/22 2116 08/17/22 0436  NA 134* 138  K 5.0 5.1  CL 101 110  CO2 24 23  GLUCOSE 127* 96  BUN 48* 48*  CREATININE 7.41* 7.42*  CALCIUM 8.0* 7.8*  PHOS  --  6.6*   Liver Function Tests: Recent Labs  Lab 08/16/22 2116  AST 12*  ALT 12  ALKPHOS 107  BILITOT 0.5  PROT 5.2*  ALBUMIN 2.1*   CBG: Recent Labs  Lab 08/17/22 0847  GLUCAP 83    Discharge time spent: greater than 30 minutes.  This record has been created using Systems analyst. Errors have been sought and corrected,but may not always be located. Such creation errors do not reflect on the standard of care.   Signed: Lorella Nimrod, MD [name] 08/17/2022

## 2022-08-17 NOTE — Plan of Care (Signed)
Patient is participating in goals of care.  Discussed goals for discharge.  Celine Ahr, RN    Problem: Education: Goal: Knowledge of General Education information will improve Description: Including pain rating scale, medication(s)/side effects and non-pharmacologic comfort measures Outcome: Progressing   Problem: Health Behavior/Discharge Planning: Goal: Ability to manage health-related needs will improve Outcome: Progressing   Problem: Clinical Measurements: Goal: Ability to maintain clinical measurements within normal limits will improve Outcome: Progressing Goal: Will remain free from infection Outcome: Progressing Goal: Diagnostic test results will improve Outcome: Progressing Goal: Respiratory complications will improve Outcome: Progressing Goal: Cardiovascular complication will be avoided Outcome: Progressing   Problem: Activity: Goal: Risk for activity intolerance will decrease Outcome: Progressing   Problem: Nutrition: Goal: Adequate nutrition will be maintained Outcome: Progressing   Problem: Coping: Goal: Level of anxiety will decrease Outcome: Progressing   Problem: Elimination: Goal: Will not experience complications related to bowel motility Outcome: Progressing Goal: Will not experience complications related to urinary retention Outcome: Progressing   Problem: Pain Managment: Goal: General experience of comfort will improve Outcome: Progressing   Problem: Safety: Goal: Ability to remain free from injury will improve Outcome: Progressing   Problem: Skin Integrity: Goal: Risk for impaired skin integrity will decrease Outcome: Progressing

## 2022-08-17 NOTE — TOC Initial Note (Addendum)
Transition of Care [facility]) - Initial/Assessment Note    Patient Details  Name: [name] MRN: [id number] Date of Birth: [birth year]  Transition of Care [facility]) CM/SW Contact:    Laurena Slimmer, RN Phone Number: 08/17/2022, 10:29 AM  Clinical Narrative:                 Case reviewed for DME needs and changes in discharge disposition.   Transition of Care [facility]) Screening Note   Patient Details  Name: [name] Date of Birth: Oct 10, 1945   Transition of Care [facility]) CM/SW Contact:    Laurena Slimmer, RN Phone Number: 08/17/2022, 3:36 PM    Transition of Care Department [facility]) has reviewed patient and no TOC needs have been identified at this time. We will continue to monitor patient advancement through interdisciplinary progression rounds. If new patient transition needs arise, please place a TOC consult.          Patient Goals and CMS Choice            Expected Discharge Plan and Services                                              Prior Living Arrangements/Services                       Activities of Daily Living      Permission Sought/Granted                  Emotional Assessment              Admission diagnosis:  Rib fracture [S22.39XA] Acute respiratory failure with hypoxia ([address]) [J96.01] Closed fracture of multiple ribs of left side, initial encounter [S22.42XA] Patient Active Problem List   Diagnosis Date Noted   Acute respiratory failure with hypoxia ([place]) 08/16/2022   Flail chest 08/16/2022   Pain 08/16/2022   Rib fracture 08/16/2022   Gait disturbance 08/16/2022   Hepatomegaly 08/16/2022   Renal cyst, native, hemorrhage 08/16/2022   Volume overload 01/17/2022   Pruritus 01/17/2022   Xerosis of skin 04/21/2021   Gynecomastia 04/20/2021   Hypertensive emergency 04/04/2021   Carotid stenosis 02/17/2021   Benign prostatic hyperplasia with lower urinary tract symptoms 02/08/2021   Acquired complex renal  cyst 02/08/2021   Atrial fibrillation and flutter ([place]) 11/28/2020   COVID-19 virus infection 11/28/2020   Vitamin D deficiency 11/23/2020   Rectal bleeding 11/06/2020   Pneumoperitoneum 11/06/2020   Resistant hypertension 11/06/2020   Diabetes mellitus with ESRD (end-stage renal disease) ([place]) 11/06/2020   CAD (coronary artery disease) 11/06/2020   Acute on chronic anemia 11/06/2020   Anemia due to blood loss 11/06/2020   Hypotension, iatrogenic 08/20/2020   ESRD on peritoneal dialysis ([name]) 08/06/2020   HFrEF (heart failure with reduced ejection fraction) (HCC)    Paroxysmal atrial fibrillation (HCC)    Anemia of chronic disease    Hypertensive urgency 05/11/2020   Chronic HFrEF (heart failure with reduced ejection fraction) ([place]) 05/11/2020   Fluid overload 05/11/2020   End stage renal disease ([place]) 02/19/2020   Aortic atherosclerosis ([place]) 01/22/2020   Hospital discharge follow-up 01/22/2020   Aortic arch atherosclerosis ([place]) 01/22/2020   Neurologic deficit as late effect of ischemic cerebrovascular accident (CVA) 01/15/2020   Dissection of left subclavian artery ([place])    Cerebrovascular accident (CVA) ([place])  01/14/2020   Constipation 05/13/2019   Cardiomyopathy ([place]) 04/26/2019   Coronary artery disease involving native coronary artery of native heart without angina pectoris 04/26/2019   ESRD (end stage renal disease) ([place]) 04/26/2019   Chronic pain not due to malignancy 04/12/2019   Chronic bilateral low back pain with bilateral sciatica 11/27/2018   Synovial cyst of lumbar spine 11/27/2018   Uncontrolled hypertension 11/14/2018   CKD (chronic kidney disease), stage V ([place]) 11/14/2018   Major depressive disorder with current active episode 07/26/2017   Anemia of chronic kidney failure, stage 4 (severe) ([place]) 07/26/2017   DOE (dyspnea on exertion) 11/25/2016   S/P UVPP (uvulopalatopharyngoplasty) 08/22/2014   Presbyacusis 08/22/2014   Adenocarcinoma, renal cell ([place])  02/26/2014   Renal cell carcinoma ([place]) 02/26/2014   Malignant neoplasm of kidney excluding renal pelvis (HCC) 02/26/2014   Hyperkalemia 11/08/2013   Prostate cancer ([place]) 07/30/2013   Elevated serum alkaline phosphatase level 07/30/2013   Malignant neoplasm of prostate ([place]) 07/30/2013   Hypertensive renal sclerosis with hypertension 10/18/2012   Renal sclerosis with hypertension 0000000   Complication of diabetes mellitus ([place]) 07/16/2012   PAD (peripheral artery disease) ([place])    Left hip pain 04/20/2012   Erectile dysfunction 04/20/2012   Hip pain 04/20/2012   tobacco abuse    Temporary cerebral vascular dysfunction 01/15/2012   Tobacco abuse counseling 05/06/2011   Counseling on substance use and abuse 05/06/2011   Hyperlipidemia    History of renal cell carcinoma    PCP:  Crecencio Mc, MD Pharmacy:   [facility], [address] [place] [address] Phone: [phone] Fax: [phone], LLC - [place], [address] [address] Phone: [phone] Fax: [phone]     Social Determinants of Health (SDOH) Social History: [place]: No Food Insecurity (04/22/2022)  Housing: Low Risk  (04/22/2022)  Transportation Needs: No Transportation Needs (04/22/2022)  Utilities: Not At Risk (04/22/2022)  Depression (PHQ2-9): Low Risk  (04/22/2022)  Financial Resource Strain: Low Risk  (04/22/2022)  Physical Activity: Insufficiently Active (04/22/2022)  Social Connections: Unknown (04/22/2022)  Stress: No Stress Concern Present (04/22/2022)  Tobacco Use: High Risk (08/06/2022)   SDOH Interventions:     Readmission Risk Interventions    04/03/2021    9:35 AM 11/29/2020   12:40 PM  Readmission Risk Prevention Plan  Transportation Screening Complete Complete  Medication Review ([place]) Complete Complete  PCP or Specialist appointment  within 3-5 days of discharge Complete Complete  HRI or Home Care Consult Complete   SW Recovery Care/Counseling Consult Complete   Palliative Care Screening Not Applicable Not Applicable  Skilled Nursing Facility Complete Not Applicable

## 2022-08-17 NOTE — Hospital Course (Addendum)
Taken from H&P.   [name] is a 77 y.o. male with medical history significant of ESRD on PD, atrial fibrillation and flutter on Eliquis, progressive gait disturbance/leg weakness for about 6 months duration as per the patient.  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  At approximately 2 PM this afternoon, patient was at a worksite and trying to get onto a platform about hip height, unfortunately patient lost his footing while trying to get up and fell onto his right side onto 50 gallon drum hitting the right anterior chest at the edge of the drum.  Followed by severe pain at the site especially with breathing.  Patient denies any presyncope or vertigo or loss of consciousness or any new focal weakness.   ED course.  Mostly stable vitals except some hypoxia requiring up to 2 L of oxygen and significant chest pain.  CT chest with concern of non-displaced fracture of left lateral seventh and eighth and a possible fracture of 12th ribs. Also noted to have free air in the abdomen with a small amount of free fluid, likely indicating bowel perforation.  Suggesting CT abdomen and pelvis which was obtained which also shows persistent extensive pneumoperitoneum which is more than usual in the setting of PD catheter.  Patient has diffuse colonic diverticulosis but no sign of diverticulitis or other inflammatory changes suggesting bowel perforation.  Also noted to have a 6.4 cm left cystic lesion which was suggestive of hemorrhagic cyst-recommending short-term follow-up ultrasound.  3/26: Vitals with elevated blood pressure at 172/60.  Patient is on multiple antihypertensives which were continued.  Continue to have significant pain and paradoxical chest wall movements on exam.  Asked general surgery to evaluate him and they were concerned that he should be in a place with trauma team is available.  Discussed with Dr. Grandville Silos from trauma team at  [facility], he does not think that he should be coming under trauma team but agreed to do consultation if stays under hospitalist services. Nephrology was also consulted to continue with peritoneal dialysis.  Pneumoperitoneum is most likely due to PD catheter as his abdominal exam was benign.  No concern of any visceral perforation at this time.  His anticoagulation is currently on hold.  Can be resumed in a day or so if remained stable.  Patient is being transferred to [facility] under [facility] hospitalist service and trauma team should be consulted to stay on board.

## 2022-08-17 NOTE — Progress Notes (Signed)
Pt resting in bed watching tv. Pt. Has no c/o at this time. Will continue to monitor.

## 2022-08-17 NOTE — Progress Notes (Signed)
[facility]  ROUNDING NOTE   Subjective:   [name] is a 77 year old Caucasian male with a past medical history of chronic combined systolic and diastolic CHF, hypertension, coronary artery disease, peripheral artery disease, sleep apnea, renal cell carcinoma, s/p left heminephrectomy, CVA, paroxysmal atrial fibrillation and atrial flutter, chronic pneumoperitoneum who presented to the ER after suffering a fall at work.  Patient states he fell 4 feet onto a fuel can, landing on his left side.  He has been admitted for Rib fracture [S22.39XA] Acute respiratory failure with hypoxia ([place]) [J96.01] Closed fracture of multiple ribs of left side, initial encounter [S22.42XA]  Patient is known to our practice and is followed outpatient by [facility], supervised by Dr. Juleen China in the PD clinic.  Patient was last seen during PD clinic yesterday.  Patient states last peritoneal dialysis treatment was on Sunday night, treatment went well.  Patient seen and evaluated lying in bed.  Appears uncomfortable.  Remains on room air.  No lower extremity edema.  Denies nausea, vomiting, or diarrhea.  Does complain of painful inspiration and cough.  Labs on ED arrival significant for sodium 134, glucose 127, BUN 48, creatinine 7.41, albumin 2.1, hemoglobin 9.9.  Chest CT without contrast shows a nondisplaced left lateral seventh and eighth rib and possible fracture of the left 12th rib.  CT abdomen pelvis with contrast concerning for extensive pneumoperitoneum which is not uncommon in peritoneal dialysis patients.  Also of note, a 6.4 cm left cystic lesion suggestive of hemorrhagic cyst, recommended ultrasound follow-up.  We have been consulted to manage peritoneal dialysis needs during this admission.   Objective:  Vital signs in last 24 hours:  Temp:  [97.2 F (36.2 C)-98.3 F (36.8 C)] 97.9 F (36.6 C) (03/26 1231) Pulse Rate:  [47-65] 47 (03/26 1231) Resp:  [16-22] 16 (03/26 1231) BP:  (98-215)/(52-81) 136/52 (03/26 1231) SpO2:  [83 %-99 %] 91 % (03/26 1231) Weight:  [64.9 kg-67.5 kg] 67.5 kg (03/26 0039)  Weight change:  Filed Weights   08/16/22 1540 08/17/22 0039  Weight: 64.9 kg 67.5 kg    Intake/Output: I/O last 3 completed shifts: In: 1375.5 [P.O.:60; I.V.:315.5; IV Piggyback:1000] Out: 150 [Urine:150]   Intake/Output this shift:  No intake/output data recorded.  Physical Exam: General: NAD  Head: Normocephalic, atraumatic. Moist oral mucosal membranes  Eyes: Anicteric,  Lungs:  Clear to auscultation, shallow breaths  Heart: Regular rate and rhythm  Abdomen:  Soft, nontender, PDC  Extremities: Trace peripheral edema.  Neurologic: Nonfocal, moving all four extremities  Skin: No lesions  Access: PD catheter    Basic Metabolic Panel: Recent Labs  Lab 08/16/22 2116 08/17/22 0436  NA 134* 138  K 5.0 5.1  CL 101 110  CO2 24 23  GLUCOSE 127* 96  BUN 48* 48*  CREATININE 7.41* 7.42*  CALCIUM 8.0* 7.8*  PHOS  --  6.6*    Liver Function Tests: Recent Labs  Lab 08/16/22 2116  AST 12*  ALT 12  ALKPHOS 107  BILITOT 0.5  PROT 5.2*  ALBUMIN 2.1*   No results for input(s): "LIPASE", "AMYLASE" in the last 168 hours. No results for input(s): "AMMONIA" in the last 168 hours.  CBC: Recent Labs  Lab 08/16/22 2116 08/17/22 0436  WBC 12.9* 11.4*  NEUTROABS 10.2*  --   HGB 9.9* 9.1*  HCT 30.2* 28.4*  MCV 88.3 87.7  PLT 165 148*    Cardiac Enzymes: No results for input(s): "CKTOTAL", "CKMB", "CKMBINDEX", "TROPONINI" in the last  168 hours.  BNP: Invalid input(s): "POCBNP"  CBG: Recent Labs  Lab 08/17/22 0847  GLUCAP 83    Microbiology: Results for orders placed or performed during the hospital encounter of 04/01/21  Resp Panel by RT-PCR (Flu A&B, Covid) Nasopharyngeal Swab     Status: None   Collection Time: 04/01/21 11:28 AM   Specimen: Nasopharyngeal Swab; Nasopharyngeal(NP) swabs in vial transport medium  Result Value Ref  Range Status   SARS Coronavirus 2 by RT PCR NEGATIVE NEGATIVE Final    Comment: (NOTE) SARS-CoV-2 target nucleic acids are NOT DETECTED.  The SARS-CoV-2 RNA is generally detectable in upper respiratory specimens during the acute phase of infection. The lowest concentration of SARS-CoV-2 viral copies this assay can detect is 138 copies/mL. A negative result does not preclude SARS-Cov-2 infection and should not be used as the sole basis for treatment or other patient management decisions. A negative result may occur with  improper specimen collection/handling, submission of specimen other than nasopharyngeal swab, presence of viral mutation(s) within the areas targeted by this assay, and inadequate number of viral copies(<138 copies/mL). A negative result must be combined with clinical observations, patient history, and epidemiological information. The expected result is Negative.  Fact Sheet for Patients:  EntrepreneurPulse.com.au  Fact Sheet for Healthcare Providers:  IncredibleEmployment.be  This test is no t yet approved or cleared by the [place] FDA and  has been authorized for detection and/or diagnosis of SARS-CoV-2 by FDA under an Emergency Use Authorization (EUA). This EUA will remain  in effect (meaning this test can be used) for the duration of the COVID-19 declaration under Section 564(b)(1) of the Act, 21 U.S.C.section 360bbb-3(b)(1), unless the authorization is terminated  or revoked sooner.       Influenza A by PCR NEGATIVE NEGATIVE Final   Influenza B by PCR NEGATIVE NEGATIVE Final    Comment: (NOTE) The Xpert Xpress SARS-CoV-2/FLU/RSV plus assay is intended as an aid in the diagnosis of influenza from Nasopharyngeal swab specimens and should not be used as a sole basis for treatment. Nasal washings and aspirates are unacceptable for Xpert Xpress SARS-CoV-2/FLU/RSV testing.  Fact Sheet for  Patients: EntrepreneurPulse.com.au  Fact Sheet for Healthcare Providers: IncredibleEmployment.be  This test is not yet approved or cleared by the [place] FDA and has been authorized for detection and/or diagnosis of SARS-CoV-2 by FDA under an Emergency Use Authorization (EUA). This EUA will remain in effect (meaning this test can be used) for the duration of the COVID-19 declaration under Section 564(b)(1) of the Act, 21 U.S.C. section 360bbb-3(b)(1), unless the authorization is terminated or revoked.  Performed at [facility], [address] [place], [place] [address]     Coagulation Studies: Recent Labs    08/17/22 0436  LABPROT 14.7  INR 1.2    Urinalysis: No results for input(s): "COLORURINE", "LABSPEC", "PHURINE", "GLUCOSEU", "HGBUR", "BILIRUBINUR", "KETONESUR", "PROTEINUR", "UROBILINOGEN", "NITRITE", "LEUKOCYTESUR" in the last 72 hours.  Invalid input(s): "APPERANCEUR"    Imaging: CT Abdomen Pelvis W Contrast  Result Date: 08/16/2022 CLINICAL DATA:  Abdominal pain, acute, nonlocalized LUQ pain s/p fall, free air seen on CT chest Pt to ED for fall 4 ft onto diesel fuel can, hitting left side. Pt unable to stand up or ambulate, holding left side, grunting EXAM: CT ABDOMEN AND PELVIS WITH CONTRAST TECHNIQUE: Multidetector CT imaging of the abdomen and pelvis was performed using the standard protocol following bolus administration of intravenous contrast. RADIATION DOSE REDUCTION: This exam was performed according to the departmental dose-optimization  program which includes automated exposure control, adjustment of the mA and/or kV according to patient size and/or use of iterative reconstruction technique. CONTRAST:  139mL OMNIPAQUE IOHEXOL 300 MG/ML  SOLN COMPARISON:  CT chest 08/16/2022, CT abdomen pelvis 03/25/2022 FINDINGS: Lower chest: Bronchial wall thickening. Bilateral lower lobe subsegmental atelectasis. Bilateral  trace pleural effusions. Aortic valve leaflet and coronary artery calcification. Cardiomegaly. Hepatobiliary: No focal liver abnormality. No gallstones, gallbladder wall thickening, or pericholecystic fluid. No biliary dilatation. Pancreas: No focal lesion. Normal pancreatic contour. No surrounding inflammatory changes. No main pancreatic ductal dilatation. Spleen: Normal in size without focal abnormality.  Spleen noted. Adrenals/Urinary Tract: No adrenal nodule bilaterally. Multiple fluid density lesions within the kidneys suggestive of simple renal cysts. Simple renal cysts, in the absence of clinically indicated signs/symptoms, require no independent follow-up. There is a 6.4 cm left cystic lesion with interval development of layering hyperdensity of 56 Hounsfield units suggestive of a hemorrhagic cyst. No hydronephrosis. No hydroureter. The urinary bladder is unremarkable. On delayed imaging, there is no excretion of intravenous contrast from either kidneys. Stomach/Bowel: Stomach is within normal limits. No evidence of bowel wall thickening or dilatation. Diffuse colonic diverticulosis. Appendix appears normal. Vascular/Lymphatic: No abdominal aorta or iliac aneurysm. Extensive calcified and noncalcified atherosclerotic plaque of the aorta and its branche with diminutive lumen of the left common iliac artery down to 4 mm. Diminutive lumen of the infra-abdominal proximal abdominal aorta with caliber of 7 mm. No abdominal, pelvic, or inguinal lymphadenopathy. Reproductive: The prostate is enlarged measuring up to 4.9 cm. Other: Peritoneal dialysis catheter with tip terminating within the right pelvis. Trace free fluid within the upper abdomen and pelvis. Persistent moderate to large volume free intraperitoneal gas within the abdomen and pelvis. No organized fluid collection. No mesenteric fat stranding. Musculoskeletal: No chest wall abnormality. No suspicious lytic or blastic osseous lesions. No acute displaced  fracture. Multilevel degenerative changes of the spine. IMPRESSION: 1. Persistent extensive pneumoperitoneum which is more than usual in the setting of a peritoneal dialysis catheter. Given patient has diffuse colonic diverticulosis, finding could be due to a peripheral diverticula; however, no acute diverticulitis identified. No findings of inflammatory changes or other to suggest bowel perforation. 2. A 6.4 cm left cystic lesion with interval development of layering hyperdensity suggestive of a hemorrhagic cyst. Recommend short-term follow-up ultrasound for further evaluation. 3. Colonic diverticulosis with no acute diverticulitis. 4. Bilateral trace pleural effusion and trace simple free fluid ascites. 5.  Aortic Atherosclerosis (ICD10-I70.0) - extensive. 6. Please see separately dictated CT chest 08/15/2023. Electronically Signed   By: Iven Finn M.D.   On: 08/16/2022 21:30   CT Chest Wo Contrast  Result Date: 08/16/2022 CLINICAL DATA:  Chest trauma after a fall. Left anterior lower chest wall pain. EXAM: CT CHEST WITHOUT CONTRAST TECHNIQUE: Multidetector CT imaging of the chest was performed following the standard protocol without IV contrast. RADIATION DOSE REDUCTION: This exam was performed according to the departmental dose-optimization program which includes automated exposure control, adjustment of the mA and/or kV according to patient size and/or use of iterative reconstruction technique. COMPARISON:  Chest radiograph 08/16/2022 FINDINGS: Cardiovascular: Cardiac enlargement. Small pericardial effusion. Diffuse calcification of the aorta and great vessels as well as the coronary arteries. No aortic aneurysm. Mediastinum/Nodes: Thyroid gland is unremarkable. Esophagus is decompressed. Mediastinal lymph nodes are not pathologically enlarged, likely reactive. Lungs/Pleura: Trace bilateral pleural effusions with basilar atelectasis bilaterally. Emphysematous changes in the lungs. Interstitial septal  thickening with bronchial wall thickening likely representing chronic bronchitic change.  No focal consolidation. No pneumothorax. Upper Abdomen: Free intraperitoneal air is demonstrated in the upper abdomen. In the absence of recent surgery, this is suspicious for bowel perforation. Heterogeneous enlargement and heterogeneous density of the left kidney suggest renal laceration and hematoma. Small amount of free fluid in the upper abdomen. Completion CT of the abdomen and pelvis with contrast is suggested. Musculoskeletal: Nondisplaced fractures of the left lateral seventh and eighth rib. Possible nondisplaced fracture of the left twelfth rib. Degenerative changes in the spine. No vertebral compression. IMPRESSION: 1. Nondisplaced fractures of the left lateral seventh and eighth ribs as well as possible fracture of the left twelfth rib. 2. Free air in the abdomen with small amount of free fluid likely indicating bowel perforation. 3. Heterogeneous enlargement and density in the left kidney suggesting renal laceration with hematoma. 4. Completion CT of the abdomen and pelvis with contrast is suggested. 5. Trace bilateral pleural effusions. Emphysematous changes and fibrosis with bronchitic changes in the lungs. 6. Cardiac enlargement with small pericardial effusion. Critical Value/emergent results were called by telephone at the time of interpretation on 08/16/2022 at 8:27 pm to provider [facility] , who verbally acknowledged these results. Electronically Signed   By: Lucienne Capers M.D.   On: 08/16/2022 20:35   DG Chest Port 1 View  Result Date: 08/16/2022 CLINICAL DATA:  Fall EXAM: PORTABLE CHEST 1 VIEW COMPARISON:  Chest x-ray dated January 07, 2022 FINDINGS: Unchanged cardiomegaly. Mild diffuse interstitial opacities. No consolidation. Skin folds overlying the left hemithorax. No evidence of pleural effusion or pneumothorax. IMPRESSION: 1. No focal consolidation. 2. Mild diffuse interstitial opacities,  likely due to pulmonary edema. Electronically Signed   By: Yetta Glassman M.D.   On: 08/16/2022 16:05     Medications:     acetaminophen  650 mg Oral Q4H   amiodarone  200 mg Oral Daily   amLODipine  5 mg Oral Daily   ascorbic acid  500 mg Oral Daily   bisoprolol  5 mg Oral BID   celecoxib  200 mg Oral BID   [START ON 08/20/2022] cloNIDine  0.1 mg Transdermal Q Fri   cloNIDine  0.2 mg Oral QHS   docusate sodium  100 mg Oral BID   doxazosin  8 mg Oral Daily   [START ON 08/22/2022] dupilumab  300 mg Subcutaneous Q14 Days   ferric citrate  210 mg Oral TID   hydrOXYzine  25 mg Oral TID   lidocaine  1 patch Transdermal Q24H   losartan  50 mg Oral Daily   multivitamin  1 tablet Oral QHS   oxyCODONE  10 mg Oral Q12H   sevelamer carbonate  800 mg Oral TID WC   sodium chloride flush  3 mL Intravenous Q12H   torsemide  100 mg Oral Daily   albuterol, fluticasone, hydrALAZINE, HYDROmorphone (DILAUDID) injection, lactulose, ondansetron (ZOFRAN) IV  Assessment/ Plan:  [name] is a 77 y.o.  male with a past medical history of chronic combined systolic and diastolic CHF, hypertension, coronary artery disease, peripheral artery disease, sleep apnea, renal cell carcinoma, s/p left heminephrectomy, CVA, paroxysmal atrial fibrillation and atrial flutter, chronic pneumoperitoneum who came to the ER after suffering a fall from work with injury.  Patient has been admitted for Rib fracture [S22.39XA] Acute respiratory failure with hypoxia ([facility]) [J96.01] Closed fracture of multiple ribs of left side, initial encounter [S22.42XA]  [facility] 2 L fills/5 exchanges/1.5-hour dwell/no last fill/57.5 kg  End-stage renal disease on peritoneal dialysis.  Will continue  nightly treatments during this admission.  Will perform treatment tonight with 1.5% dialysate per outpatient prescription.  2. Flail chest, s/p 4 ft fall. CT chest shows nondisplaced left lateral seventh and eighth rib and possible  fracture of the left 12th rib. Surgery recommends close monitoring at trauma center.   3. Anemia of chronic kidney disease Lab Results  Component Value Date   HGB 9.1 (L) 08/17/2022    Hemoglobin within desired range.  4. Secondary Hyperparathyroidism: with outpatient labs: PTH 1222, phosphorus 6.2, calcium 8.4 on 07/26/2022.   Lab Results  Component Value Date   PTH 315 (H) 05/11/2017   CALCIUM 7.8 (L) 08/17/2022   CAION 1.11 (L) 06/29/2022   PHOS 6.6 (H) 08/17/2022    Patient currently prescribed calcitriol, Cinacalcet, and sevelamer outpatient.  Continue sevelamer with meals.  5. Hypertension with chronic kidney disease. Home regimen includes amlodipine, bisoprolol, clonidine, losartan and torsemide. Receiving these medications.    LOS: 1 Tyechia Allmendinger 3/26/20241:52 PM

## 2022-08-17 NOTE — Progress Notes (Signed)
Patient transferred to [facility] room 17. Report given and patient transferred via [place]. VSS and patient stated no concerns, all questions answered. Patient's wife updated on patient's transfer.

## 2022-08-17 NOTE — Consult Note (Signed)
Subjective:   CC: Flail chest and pneumoperitoneum  HPI:  [name] is a 77 y.o. male who was consulted by [facility] for evaluation of above.  Status post fall last night.  Imaging noted to have 2, possibly 3 rib fractures with documented paradoxical chest wall movement consistent with flail chest.  CT abdomen pelvis also noted moderate amount of pneumoperitoneum for which report indicating degree is slightly more than expected from a PD catheter, which patient uses for PD  Patient this morning states pain is better controlled but still difficult to breath without significant discomfort he denies any abdominal pain.  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), Atrial fibrillation with rapid ventricular response ([place]) (05/12/2020), Cardiomyopathy ([place]), Carotid arterial disease ([place]), Claudication ([place]), Complication of anesthesia, Diabetes mellitus without complication ([place]), Dysplastic nevus (02/07/2013), ESRD (end stage renal disease) ([place]), Hyperlipidemia, Hypertension, Migraine, Neuromuscular disorder ([place]), Obstructive sleep apnea, PAD (peripheral artery disease) ([place]) (06/2007), Renal cell carcinoma (2004), Renal insufficiency, Sepsis ([place]) (11/06/2020), Skin cancer, Stroke ([place]), TIA (transient ischemic attack), and tobacco abuse.  Past Surgical History:  has a past surgical history that includes heminephrectomy (2004); Renal cryoablation (Jan 2006); sciatica; cyst removal (12/25/2015); Cardiac catheterization; CAPD insertion (N/A, 03/01/2019); and RIGHT HEART CATH AND CORONARY ANGIOGRAPHY (Bilateral, 06/29/2022).  Family History: family history includes Aneurysm in his maternal grandmother, mother, and paternal grandmother; COPD in his brother; Cancer in his mother; Coronary artery disease in his father and paternal grandfather; Heart attack (age of onset: 41) in his father; Heart disease  in his brother and father; Hypertension in his brother, father, and mother; Stroke in his paternal uncle; Stroke (age of onset: 50) in his father.  Social History:  reports that he has been smoking cigarettes. He has a 50.00 pack-year smoking history. He has never used smokeless tobacco. He reports that he does not currently use alcohol. He reports that he does not use drugs.  Current Medications:  Prior to Admission medications   Medication Sig Start Date End Date Taking? Authorizing Provider  amiodarone (PACERONE) 200 MG tablet Take 1 tablet (200 mg total) by mouth daily. 07/16/22  Yes Hammock, Sheri, NP  amLODipine (NORVASC) 5 MG tablet Take 1 tablet (5 mg total) by mouth daily. 06/29/22 06/29/23 Yes End, Harrell Gave, MD  Ascorbic Acid 500 MG CHEW Chew 1 tablet by mouth daily.   Yes [provider]  b complex vitamins capsule Take 1 capsule by mouth daily.   Yes [provider]  bisoprolol (ZEBETA) 5 MG tablet Take 1 tablet (5 mg total) by mouth 2 (two) times daily. 01/18/22  Yes Emeterio Reeve, DO  cloNIDine (CATAPRES - DOSED IN MG/24 HR) 0.1 mg/24hr patch Place 1 patch (0.1 mg total) onto the skin every Friday. 03/27/21  Yes Vickie Epley, MD  cloNIDine (CATAPRES) 0.2 MG tablet Take 0.2 mg by mouth at bedtime. 03/05/22  Yes [provider]  doxazosin (CARDURA) 8 MG tablet Take 8 mg by mouth daily. 03/29/20  Yes [provider]  dupilumab (DUPIXENT) 300 MG/2ML prefilled syringe Inject 300 mg into the skin every 14 (fourteen) days. Starting at day 15 for maintenance. 08/03/22  Yes Ralene Bathe, MD  ELIQUIS 5 MG TABS tablet TAKE 1 TABLET BY MOUTH TWICE DAILY 05/13/22  Yes End, Harrell Gave, MD  ferric citrate (AURYXIA) 1 GM 210 MG(Fe) tablet Take 1 tablet by mouth 3 (three) times daily. 07/13/19  Yes  [provider]  gentamicin cream (GARAMYCIN) 0.1 % Apply 1 application topically daily. 11/07/20  Yes Fritzi Mandes, MD  hydrOXYzine (ATARAX) 25 MG  tablet Take 25 mg by mouth 3 (three) times daily. 12/29/21  Yes [provider]  ketoconazole (NIZORAL) 2 % cream Apply 1 Application topically at bedtime. Qhs to feet for scaly rash on feet 05/26/22  Yes Ralene Bathe, MD  losartan (COZAAR) 100 MG tablet TAKE 1 TABLET BY MOUTH ONCE DAILY *DOSE INCREASE* 03/10/22  Yes End, Harrell Gave, MD  multivitamin (RENA-VIT) TABS tablet Take 1 tablet by mouth at bedtime. 12/21/19  Yes [provider]  sevelamer (RENAGEL) 800 MG tablet Take 800 mg by mouth 3 (three) times daily with meals.   Yes [provider]  tacrolimus (PROTOPIC) 0.1 % ointment Apply topically 2 (two) times daily. Bid to itchy rash on body 05/26/22  Yes Ralene Bathe, MD  torsemide [facility]) 100 MG tablet Take 100 mg by mouth daily.   Yes [provider]  albuterol (VENTOLIN HFA) 108 (90 Base) MCG/ACT inhaler Inhale 2 puffs into the lungs every 6 (six) hours as needed for wheezing or shortness of breath. 05/04/22   End, Harrell Gave, MD  fluticasone (FLONASE) 50 MCG/ACT nasal spray Place 2 sprays into both nostrils daily as needed for allergies or rhinitis.    [provider]  lactulose (CHRONULAC) 10 GM/15ML solution Take 15-30 mL by mouth daily as needed for constipation 06/25/22   End, Harrell Gave, MD  triamcinolone cream (KENALOG) 0.1 % Apply 1 Application topically as needed (itching). 04/13/22   [provider]    Allergies:  Allergies  Allergen Reactions   Irbesartan Other (See Comments)    hyperkalemia   Cymbalta [Duloxetine Hcl]    Hydralazine Other (See Comments)    headache   Imdur [Isosorbide Nitrate] Other (See Comments)    headache   Rosuvastatin     Breast swelling/soreness   Atorvastatin Other (See Comments)    Muscle pain   Bystolic [Nebivolol Hcl]     Extreme fatigue     ROS:  General: Denies weight loss, weight gain, fatigue, fevers, chills, and night sweats. Eyes: Denies blurry vision, double vision, eye  pain, itchy eyes, and tearing. Ears: Denies hearing loss, earache, and ringing in ears. Nose: Denies sinus pain, congestion, infections, runny nose, and nosebleeds. Mouth/throat: Denies hoarseness, sore throat, bleeding gums, and difficulty swallowing. Heart: Denies chest pain, palpitations, racing heart, irregular heartbeat, leg pain or swelling, and decreased activity tolerance. Respiratory: Denies breathing difficulty, shortness of breath, wheezing, cough, and sputum. GI: Denies change in appetite, heartburn, nausea, vomiting, constipation, diarrhea, and blood in stool. GU: Denies difficulty urinating, pain with urinating, urgency, frequency, blood in urine. Musculoskeletal: Denies joint stiffness, pain, swelling, muscle weakness. Skin: Denies rash, itching, mass, tumors, sores, and boils Neurologic: Denies headache, fainting, dizziness, seizures, numbness, and tingling. Psychiatric: Denies depression, anxiety, difficulty sleeping, and memory loss. Endocrine: Denies heat or cold intolerance, and increased thirst or urination. Blood/lymph: Denies easy bruising, easy bruising, and swollen glands     Objective:     BP (!) 165/58 (BP Location: Left Arm)   Pulse (!) 55   Temp 98 F (36.7 C)   Resp 16   Ht 5\' 6"  (1.676 m)   Wt 67.5 kg   SpO2 90%   BMI 24.02 kg/m   Constitutional :  alert, cooperative, appears stated age, and no distress  Lymphatics/Throat:  no asymmetry, masses, or scars  Respiratory:  clear to  auscultation bilaterally and obvious flail chest and left lower chest wall area with paradoxical chest movement on exam  Cardiovascular:  irregularly irregular rhythm  Gastrointestinal: soft, non-tender; bowel sounds normal; no masses,  no organomegaly.  PD cath in place, no evidence of bruising or overlying skin changes in the abdominal wall  Musculoskeletal: Steady gait and movement  Skin: Cool and moist  Psychiatric: Normal affect, non-agitated, not confused        LABS:     Latest Ref Rng & Units 08/17/2022    4:36 AM 08/16/2022    9:16 PM 06/29/2022    9:42 AM  CMP  Glucose 70 - 99 mg/dL 96  127    BUN 8 - 23 mg/dL 48  48    Creatinine 0.61 - 1.24 mg/dL 7.42  7.41    Sodium 135 - 145 mmol/L 138  134  139   Potassium 3.5 - 5.1 mmol/L 5.1  5.0  4.2   Chloride 98 - 111 mmol/L 110  101    CO2 22 - 32 mmol/L 23  24    Calcium 8.9 - 10.3 mg/dL 7.8  8.0    Total Protein 6.5 - 8.1 g/dL  5.2    Total Bilirubin 0.3 - 1.2 mg/dL  0.5    Alkaline Phos 38 - 126 U/L  107    AST 15 - 41 U/L  12    ALT 0 - 44 U/L  12        Latest Ref Rng & Units 08/17/2022    4:36 AM 08/16/2022    9:16 PM 06/29/2022    9:42 AM  CBC  WBC 4.0 - 10.5 K/uL 11.4  12.9    Hemoglobin 13.0 - 17.0 g/dL 9.1  9.9  8.2   Hematocrit 39.0 - 52.0 % 28.4  30.2  24.0   Platelets 150 - 400 K/uL 148  165      RADS: Narrative & Impression  CLINICAL DATA:  Chest trauma after a fall. Left anterior lower chest wall pain.   EXAM: CT CHEST WITHOUT CONTRAST   TECHNIQUE: Multidetector CT imaging of the chest was performed following the standard protocol without IV contrast.   RADIATION DOSE REDUCTION: This exam was performed according to the departmental dose-optimization program which includes automated exposure control, adjustment of the mA and/or kV according to patient size and/or use of iterative reconstruction technique.   COMPARISON:  Chest radiograph 08/16/2022   FINDINGS: Cardiovascular: Cardiac enlargement. Small pericardial effusion. Diffuse calcification of the aorta and great vessels as well as the coronary arteries. No aortic aneurysm.   Mediastinum/Nodes: Thyroid gland is unremarkable. Esophagus is decompressed. Mediastinal lymph nodes are not pathologically enlarged, likely reactive.   Lungs/Pleura: Trace bilateral pleural effusions with basilar atelectasis bilaterally. Emphysematous changes in the lungs. Interstitial septal thickening with bronchial wall  thickening likely representing chronic bronchitic change. No focal consolidation. No pneumothorax.   Upper Abdomen: Free intraperitoneal air is demonstrated in the upper abdomen. In the absence of recent surgery, this is suspicious for bowel perforation. Heterogeneous enlargement and heterogeneous density of the left kidney suggest renal laceration and hematoma. Small amount of free fluid in the upper abdomen. Completion CT of the abdomen and pelvis with contrast is suggested.   Musculoskeletal: Nondisplaced fractures of the left lateral seventh and eighth rib. Possible nondisplaced fracture of the left twelfth rib. Degenerative changes in the spine. No vertebral compression.   IMPRESSION: 1. Nondisplaced fractures of the left lateral seventh and eighth ribs as well  as possible fracture of the left twelfth rib. 2. Free air in the abdomen with small amount of free fluid likely indicating bowel perforation. 3. Heterogeneous enlargement and density in the left kidney suggesting renal laceration with hematoma. 4. Completion CT of the abdomen and pelvis with contrast is suggested. 5. Trace bilateral pleural effusions. Emphysematous changes and fibrosis with bronchitic changes in the lungs. 6. Cardiac enlargement with small pericardial effusion.   Critical Value/emergent results were called by telephone at the time of interpretation on 08/16/2022 at 8:27 pm to provider [facility] , who verbally acknowledged these results.     Electronically Signed   By: Lucienne Capers M.D.   On: 08/16/2022 20:35     Narrative & Impression  CLINICAL DATA:  Abdominal pain, acute, nonlocalized LUQ pain s/p fall, free air seen on CT chest Pt to ED for fall 4 ft onto diesel fuel can, hitting left side. Pt unable to stand up or ambulate, holding left side, grunting   EXAM: CT ABDOMEN AND PELVIS WITH CONTRAST   TECHNIQUE: Multidetector CT imaging of the abdomen and pelvis was performed using  the standard protocol following bolus administration of intravenous contrast.   RADIATION DOSE REDUCTION: This exam was performed according to the departmental dose-optimization program which includes automated exposure control, adjustment of the mA and/or kV according to patient size and/or use of iterative reconstruction technique.   CONTRAST:  127mL OMNIPAQUE IOHEXOL 300 MG/ML  SOLN   COMPARISON:  CT chest 08/16/2022, CT abdomen pelvis 03/25/2022   FINDINGS: Lower chest: Bronchial wall thickening. Bilateral lower lobe subsegmental atelectasis. Bilateral trace pleural effusions. Aortic valve leaflet and coronary artery calcification. Cardiomegaly.   Hepatobiliary: No focal liver abnormality. No gallstones, gallbladder wall thickening, or pericholecystic fluid. No biliary dilatation.   Pancreas: No focal lesion. Normal pancreatic contour. No surrounding inflammatory changes. No main pancreatic ductal dilatation.   Spleen: Normal in size without focal abnormality.  Spleen noted.   Adrenals/Urinary Tract:   No adrenal nodule bilaterally.   Multiple fluid density lesions within the kidneys suggestive of simple renal cysts. Simple renal cysts, in the absence of clinically indicated signs/symptoms, require no independent follow-up. There is a 6.4 cm left cystic lesion with interval development of layering hyperdensity of 56 Hounsfield units suggestive of a hemorrhagic cyst.   No hydronephrosis. No hydroureter.   The urinary bladder is unremarkable.   On delayed imaging, there is no excretion of intravenous contrast from either kidneys.   Stomach/Bowel: Stomach is within normal limits. No evidence of bowel wall thickening or dilatation. Diffuse colonic diverticulosis. Appendix appears normal.   Vascular/Lymphatic: No abdominal aorta or iliac aneurysm. Extensive calcified and noncalcified atherosclerotic plaque of the aorta and its branche with diminutive lumen of the left  common iliac artery down to 4 mm. Diminutive lumen of the infra-abdominal proximal abdominal aorta with caliber of 7 mm. No abdominal, pelvic, or inguinal lymphadenopathy.   Reproductive: The prostate is enlarged measuring up to 4.9 cm.   Other: Peritoneal dialysis catheter with tip terminating within the right pelvis. Trace free fluid within the upper abdomen and pelvis. Persistent moderate to large volume free intraperitoneal gas within the abdomen and pelvis. No organized fluid collection. No mesenteric fat stranding.   Musculoskeletal:   No chest wall abnormality.   No suspicious lytic or blastic osseous lesions. No acute displaced fracture. Multilevel degenerative changes of the spine.   IMPRESSION: 1. Persistent extensive pneumoperitoneum which is more than usual in the setting of a peritoneal dialysis  catheter. Given patient has diffuse colonic diverticulosis, finding could be due to a peripheral diverticula; however, no acute diverticulitis identified. No findings of inflammatory changes or other to suggest bowel perforation. 2. A 6.4 cm left cystic lesion with interval development of layering hyperdensity suggestive of a hemorrhagic cyst. Recommend short-term follow-up ultrasound for further evaluation. 3. Colonic diverticulosis with no acute diverticulitis. 4. Bilateral trace pleural effusion and trace simple free fluid ascites. 5.  Aortic Atherosclerosis (ICD10-I70.0) - extensive. 6. Please see separately dictated CT chest 08/15/2023.     Electronically Signed   By: Iven Finn M.D.   On: 08/16/2022 21:30      Assessment:      Flail chest and pneumoperitoneum  Plan:     Abdominal exam completely benign and patient reports no abdominal symptoms.  Pneumoperitoneum likely is from PD cath and not related to any bowel injury.  Mechanism of injury with fall from standing position also makes it unlikely to have any blunt trauma bowel perforation.  The  obvious flail chest is more concerning from a trauma standpoint.  With his advanced age, multiple comorbidities including use of blood thinners, recommend evaluation at appointment trauma center for close monitoring and access to additional treatments if warranted.   labs/images/medications/previous chart entries reviewed personally and relevant changes/updates noted above.

## 2022-08-17 NOTE — Progress Notes (Signed)
Pt with h/o ESRD on CCPD who was seen by his primary Nephrology team at [facility] earlier today and transferred to [facility] with flail chest after a fall at home.  Will order CCPD and will be seen by our team in the am.  Normal prescription:  2L fills, 5 exchanges, 1.5 hour dwell, no last fill.  EDW 57.5kg.

## 2022-08-17 NOTE — Progress Notes (Signed)
TRH night cross cover note:   I was notified by RN of the patient's request for nicotine patch, in this patient he smokes 1 pack/day.  Subsequently, I placed order for 21 mg nicotine patch daily, with first patch to be administered now.     Babs Bertin, DO Hospitalist

## 2022-08-18 ENCOUNTER — Ambulatory Visit: Payer: Medicare PPO | Admitting: Dermatology

## 2022-08-18 DIAGNOSIS — I2510 Atherosclerotic heart disease of native coronary artery without angina pectoris: Secondary | ICD-10-CM

## 2022-08-18 DIAGNOSIS — S2242XD Multiple fractures of ribs, left side, subsequent encounter for fracture with routine healing: Secondary | ICD-10-CM | POA: Diagnosis not present

## 2022-08-18 DIAGNOSIS — N186 End stage renal disease: Secondary | ICD-10-CM

## 2022-08-18 DIAGNOSIS — I10 Essential (primary) hypertension: Secondary | ICD-10-CM | POA: Diagnosis not present

## 2022-08-18 LAB — COMPREHENSIVE METABOLIC PANEL
ALT: 13 U/L (ref 0–44)
AST: 13 U/L — ABNORMAL LOW (ref 15–41)
Albumin: 1.9 g/dL — ABNORMAL LOW (ref 3.5–5.0)
Alkaline Phosphatase: 122 U/L (ref 38–126)
Anion gap: 11 (ref 5–15)
BUN: 49 mg/dL — ABNORMAL HIGH (ref 8–23)
CO2: 24 mmol/L (ref 22–32)
Calcium: 8.1 mg/dL — ABNORMAL LOW (ref 8.9–10.3)
Chloride: 102 mmol/L (ref 98–111)
Creatinine, Ser: 8.09 mg/dL — ABNORMAL HIGH (ref 0.61–1.24)
GFR, Estimated: 6 mL/min — ABNORMAL LOW (ref >60)
Glucose, Bld: 203 mg/dL — ABNORMAL HIGH (ref 70–99)
Potassium: 4.7 mmol/L (ref 3.5–5.1)
Sodium: 137 mmol/L (ref 135–145)
Total Bilirubin: 0.6 mg/dL (ref 0.3–1.2)
Total Protein: 4.9 g/dL — ABNORMAL LOW (ref 6.5–8.1)

## 2022-08-18 LAB — CBC WITH DIFFERENTIAL/PLATELET
Abs Immature Granulocytes: 0.14 10*3/uL — ABNORMAL HIGH (ref 0.00–0.07)
Basophils Absolute: 0 10*3/uL (ref 0.0–0.1)
Basophils Relative: 0 %
Eosinophils Absolute: 0 10*3/uL (ref 0.0–0.5)
Eosinophils Relative: 0 %
HCT: 29 % — ABNORMAL LOW (ref 39.0–52.0)
Hemoglobin: 9.8 g/dL — ABNORMAL LOW (ref 13.0–17.0)
Immature Granulocytes: 1 %
Lymphocytes Relative: 3 %
Lymphs Abs: 0.6 10*3/uL — ABNORMAL LOW (ref 0.7–4.0)
MCH: 29.3 pg (ref 26.0–34.0)
MCHC: 33.8 g/dL (ref 30.0–36.0)
MCV: 86.6 fL (ref 80.0–100.0)
Monocytes Absolute: 1 10*3/uL (ref 0.1–1.0)
Monocytes Relative: 5 %
Neutro Abs: 17.3 10*3/uL — ABNORMAL HIGH (ref 1.7–7.7)
Neutrophils Relative %: 91 %
Platelets: 165 10*3/uL (ref 150–400)
RBC: 3.35 MIL/uL — ABNORMAL LOW (ref 4.22–5.81)
RDW: 14.9 % (ref 11.5–15.5)
WBC: 19.1 10*3/uL — ABNORMAL HIGH (ref 4.0–10.5)
nRBC: 0 % (ref 0.0–0.2)

## 2022-08-18 MED ORDER — OXYCODONE HCL 5 MG PO TABS
5.0000 mg | ORAL_TABLET | ORAL | Status: DC | PRN
  Administered 2022-08-18: 5 mg via ORAL
  Filled 2022-08-18: qty 1

## 2022-08-18 MED ORDER — PROMETHAZINE HCL 12.5 MG PO TABS: 12.5000 mg | ORAL_TABLET | Freq: Four times a day (QID) | ORAL | Status: DC | PRN

## 2022-08-18 MED ORDER — SCOPOLAMINE 1 MG/3DAYS TD PT72
1.0000 | MEDICATED_PATCH | TRANSDERMAL | Status: DC
  Administered 2022-08-18 – 2022-08-21 (×2): 1.5 mg via TRANSDERMAL
  Filled 2022-08-18 (×2): qty 1

## 2022-08-18 MED ORDER — ACETAMINOPHEN 500 MG PO TABS
1000.0000 mg | ORAL_TABLET | Freq: Four times a day (QID) | ORAL | Status: DC
  Administered 2022-08-18: 1000 mg via ORAL
  Filled 2022-08-18: qty 2

## 2022-08-18 MED ORDER — POLYETHYLENE GLYCOL 3350 17 G PO PACK
17.0000 g | PACK | Freq: Every day | ORAL | Status: DC
  Administered 2022-08-18: 17 g via ORAL
  Filled 2022-08-18: qty 1

## 2022-08-18 MED ORDER — DOCUSATE SODIUM 100 MG PO CAPS
100.0000 mg | ORAL_CAPSULE | Freq: Two times a day (BID) | ORAL | Status: DC
  Administered 2022-08-18 – 2022-08-22 (×7): 100 mg via ORAL
  Filled 2022-08-18 (×7): qty 1

## 2022-08-18 MED ORDER — PROMETHAZINE HCL 12.5 MG RE SUPP: 12.5000 mg | Freq: Four times a day (QID) | RECTAL | Status: DC | PRN

## 2022-08-18 MED ORDER — ACETAMINOPHEN 500 MG PO TABS: 1000.0000 mg | ORAL_TABLET | Freq: Three times a day (TID) | ORAL | Status: DC

## 2022-08-18 MED ORDER — SODIUM CHLORIDE 0.9 % IV SOLN
12.5000 mg | Freq: Four times a day (QID) | INTRAVENOUS | Status: DC | PRN
  Administered 2022-08-18 (×2): 12.5 mg via INTRAVENOUS
  Filled 2022-08-18 (×2): qty 0.5

## 2022-08-18 MED ORDER — POLYETHYLENE GLYCOL 3350 17 G PO PACK
17.0000 g | PACK | Freq: Two times a day (BID) | ORAL | Status: DC
  Administered 2022-08-19 – 2022-08-22 (×5): 17 g via ORAL
  Filled 2022-08-18 (×5): qty 1

## 2022-08-18 MED ORDER — DELFLEX-LC/2.5% DEXTROSE 394 MOSM/L IP SOLN: INTRAPERITONEAL | Status: DC

## 2022-08-18 MED ORDER — HYDROMORPHONE HCL 1 MG/ML IJ SOLN: 0.5000 mg | INTRAMUSCULAR | Status: DC | PRN

## 2022-08-18 MED ORDER — HYDROMORPHONE HCL 1 MG/ML IJ SOLN
0.5000 mg | Freq: Four times a day (QID) | INTRAMUSCULAR | Status: DC | PRN
  Administered 2022-08-18 – 2022-08-19 (×2): 0.5 mg via INTRAVENOUS
  Filled 2022-08-18 (×2): qty 0.5

## 2022-08-18 MED ORDER — TORSEMIDE 20 MG PO TABS
100.0000 mg | ORAL_TABLET | Freq: Every day | ORAL | Status: DC
  Administered 2022-08-18: 100 mg via ORAL
  Filled 2022-08-18: qty 5

## 2022-08-18 MED ORDER — METOCLOPRAMIDE HCL 5 MG/ML IJ SOLN
10.0000 mg | Freq: Four times a day (QID) | INTRAMUSCULAR | Status: DC | PRN
  Administered 2022-08-18 – 2022-08-27 (×2): 10 mg via INTRAVENOUS
  Filled 2022-08-18 (×2): qty 2

## 2022-08-18 MED ORDER — LACTULOSE 10 GM/15ML PO SOLN: 30.0000 g | Freq: Two times a day (BID) | ORAL | Status: DC | PRN

## 2022-08-18 MED ORDER — NALOXONE HCL 0.4 MG/ML IJ SOLN: 0.4000 mg | INTRAMUSCULAR | Status: DC | PRN

## 2022-08-18 NOTE — Evaluation (Signed)
Occupational Therapy Evaluation Patient Details Name: [name] MRN: [id number] DOB: Jun 26, 1945 Today's Date: 08/18/2022   History of Present Illness Pt is a 77 y/o M admitted to [facility] from [facility] on 3/25 after a fall resulting in nondisplaced L 7th and 8th rib fractures, seen on chest CT. Imaging also revealed free air in abdomen. PMHx: CHF, A-fib, ESRD on peritoneal dialysis, HTN, HLD, CVA   Clinical Impression   This 77 yo male admitted with above presents to acute OT with PLOF of being totally independent with basic ADLs, IADLs, driving, and working on his equipment at home. He currently is setup-max A for basic ADLs and min-min guard A for all mobility. He will continue to benefit from acute OT with follow up [place].      Recommendations for follow up therapy are one component of a multi-disciplinary discharge planning process, led by the attending physician.  Recommendations may be updated based on patient status, additional functional criteria and insurance authorization.   Assistance Recommended at Discharge Frequent or constant Supervision/Assistance  Patient can return home with the following A little help with walking and/or transfers;A lot of help with bathing/dressing/bathroom;Assistance with cooking/housework;Help with stairs or ramp for entrance;Assist for transportation    Functional Status Assessment  Patient has had a recent decline in their functional status and demonstrates the ability to make significant improvements in function in a reasonable and predictable amount of time.  Equipment Recommendations  None recommended by OT       Precautions / Restrictions Precautions Precautions: Fall Precaution Comments: L 7/8 rib fracture and maybe 12th, peritoneal dialysis; monitor O2 Restrictions Weight Bearing Restrictions: No      Mobility Bed Mobility Overal bed mobility: Needs Assistance Bed Mobility: Rolling, Sidelying to Sit, Sit to Sidelying Rolling:  Supervision Sidelying to sit: Min assist, HOB elevated (A for trunk)     Sit to sidelying: Min guard General bed mobility comments: increased time for all movements due to pain    Transfers Overall transfer level: Needs assistance Equipment used: 1 person hand held assist Transfers: Sit to/from Stand Sit to Stand: Min guard                  Balance Overall balance assessment: Needs assistance Sitting-balance support: No upper extremity supported, Feet supported Sitting balance-Leahy Scale: Fair     Standing balance support: Single extremity supported Standing balance-Leahy Scale: Poor                             ADL either performed or assessed with clinical judgement   ADL Overall ADL's : Needs assistance/impaired Eating/Feeding: Independent;Sitting   Grooming: Set up;Supervision/safety;Sitting   Upper Body Bathing: Minimal assistance;Sitting   Lower Body Bathing: Maximal assistance Lower Body Bathing Details (indicate cue type and reason): min guard A sit<>stand Upper Body Dressing : Moderate assistance;Sitting   Lower Body Dressing: Maximal assistance Lower Body Dressing Details (indicate cue type and reason): min guard A sit<>stand Toilet Transfer: Min Designer, jewellery Details (indicate cue type and reason): simulated bed>step up towards [place] and Hygiene: Moderate assistance Toileting - Clothing Manipulation Details (indicate cue type and reason): min guard A sit<>stand       General ADL Comments: Educated pt on use of inspriometer in his room (10 times every hour he is awake or 5 time every 1/2 hour he is awake); using folded towel at his left side when he coughs to  brace his ribs     Vision Patient Visual Report: No change from baseline              Pertinent Vitals/Pain Pain Assessment Pain Assessment: 0-10 Pain Score: 10-Worst pain ever Pain Location: ribs--when they "catch" him with  movement or coughing Pain Descriptors / Indicators: Aching, Grimacing, Guarding Pain Intervention(s): Limited activity within patient's tolerance, Monitored during session, Repositioned, Premedicated before session     Hand Dominance Right   Extremity/Trunk Assessment Upper Extremity Assessment Upper Extremity Assessment: Overall WFL for tasks assessed (limits movement of LUE more due to that is side rib fractures are on)     Communication Communication Communication: No difficulties   Cognition Arousal/Alertness: Awake/alert Behavior During Therapy: Flat affect Overall Cognitive Status: Within Functional Limits for tasks assessed                                 General Comments: Pt A&Ox4, reports medicine is making him nauseaeous                Home Living Family/patient expects to be discharged to:: Private residence Living Arrangements: Spouse/significant other Available Help at Discharge: Family;Available 24 hours/day Type of Home: House Home Access: Stairs to enter [employer] of Steps: 2 Entrance Stairs-Rails: None Home Layout: Two level;Able to live on main level with bedroom/bathroom     Bathroom Shower/Tub: Teacher, early years/pre: Standard     Home Equipment: None   Additional Comments: wife is available 24/7, pt resides on main level of house and doesn't typically go up to the second level. Reports slightly elevated toilet      Prior Functioning/Environment Prior Level of Function : Independent/Modified Independent;Driving;Working/employed             Mobility Comments: independent with all mobility, denies falls other than the one that brought him in          OT Problem List: Decreased range of motion;Decreased activity tolerance;Impaired balance (sitting and/or standing);Pain      OT Treatment/Interventions: Self-care/ADL training;DME and/or AE instruction;Patient/family education;Balance training    OT  Goals(Current goals can be found in the care plan section) Acute Rehab OT Goals Patient Stated Goal: to be in less pain OT Goal Formulation: With patient Time For Goal Achievement: 09/01/22 Potential to Achieve Goals: Good  OT Frequency: Min 2X/week       AM-PAC OT "6 Clicks" Daily Activity     Outcome Measure Help from another person eating meals?: None Help from another person taking care of personal grooming?: A Little Help from another person toileting, which includes using toliet, bedpan, or urinal?: A Lot Help from another person bathing (including washing, rinsing, drying)?: A Lot Help from another person to put on and taking off regular upper body clothing?: A Lot Help from another person to put on and taking off regular lower body clothing?: A Lot 6 Click Score: 15   End of Session Equipment Utilized During Treatment: Oxygen (1.5 liters 92-93%; RA as low as 85%) Nurse Communication:  (pt needs meds for nausea)  Activity Tolerance: Patient limited by pain Patient left: in bed;with call bell/phone within reach;with bed alarm set;with family/visitor present  OT Visit Diagnosis: Unsteadiness on feet (R26.81);Other abnormalities of gait and mobility (R26.89);Pain Pain - Right/Left: Left Pain - part of body:  (ribs)                Time: [id number]  OT Time Calculation (min): 28 min Charges:  OT General Charges $OT Visit: 1 Visit OT Evaluation $OT Eval Moderate Complexity: 1 Mod OT Treatments $Self Care/Home Management : 8-22 mins  [place] [facility] [phone]    Almon Register 08/18/2022, 2:28 PM

## 2022-08-18 NOTE — Progress Notes (Signed)
PROGRESS NOTE        PATIENT DETAILS Name: [name] Age: 77 y.o. Sex: male Date of Birth: 08-14-1945 Admit Date: 08/17/2022 Admitting Physician Emilee Hero, MD [id number], Aris Everts, MD  Brief Summary: Patient is a 77 y.o.  male with history of ESRD on peritoneal dialysis, A-fib on Eliquis, HTN, HLD-who was transferred from [facility] for concern for a flail chest following a fall with rib fracture.  Significant events: 3/25-3/26>> hospitalization at [facility] following a fall-concern for flail chest-transferred to [facility] for trauma evaluation.  Significant studies: 3/25>> CT chest: Nondisplaced left seventh/eighth rib fracture, possible 12th left rib fracture. 3/25>> CT abdomen/pelvis: Extensive pneumoperitoneum-in the setting of PD catheter.  6.4 cm left cystic lesion-suggestive of a hemorrhagic cyst.  Significant microbiology data: None  Procedures: None  Consults: Nephrology Trauma surgery  Subjective: Complains of pain in the left side of his thoracic wall.  Appears comfortable.  Objective: Vitals: Blood pressure (!) 168/74, pulse (!) 54, temperature 97.6 F (36.4 C), temperature source Oral, resp. rate 14, weight 67.9 kg, SpO2 95 %.   Exam: Gen Exam:Alert awake-not in any distress HEENT:atraumatic, normocephalic Chest: B/L clear to auscultation anteriorly CVS:S1S2 regular Abdomen:soft non tender, non distended Extremities:no edema Neurology: Non focal Skin: no rash  Pertinent Labs/Radiology:    Latest Ref Rng & Units 08/17/2022    4:36 AM 08/16/2022    9:16 PM 06/29/2022    9:42 AM  CBC  WBC 4.0 - 10.5 K/uL 11.4  12.9    Hemoglobin 13.0 - 17.0 g/dL 9.1  9.9  8.2   Hematocrit 39.0 - 52.0 % 28.4  30.2  24.0   Platelets 150 - 400 K/uL 148  165      Lab Results  Component Value Date   NA 138 08/17/2022   K 5.1 08/17/2022   CL 110 08/17/2022   CO2 23 08/17/2022      Assessment/Plan: Mechanical fall with left seventh, eighth  and possible 12th rib fracture Incentive spirometry Multimodal pain control Trauma surgery following Mobilize with PT/OT  Acute hypoxic respiratory failure Due to rib fracture/splinting/atelectasis Incentive spirometry/mobilize  Left renal hemorrhagic cyst Incidental finding on CT abdomen Eliquis on hold for now-will discuss with nephrology regarding clinical significance if any.  ESRD on peritoneal dialysis Nephrology following  PAF Sinus rhythm on telemetry Continue amiodarone Eliquis on hold-due to possible hemorrhagic cyst.  Chronic HFrEF Volume status stable Volume overload PD Also on high-dose Demadex.  CAD (LHC 06/30/2022-two-vessel disease-70-80% mid LAD stenosis, chronic total occlusion of RCA with left-right collaterals-optimization of medical therapy recommended) Not on antiplatelets as he is on Eliquis Intolerant to statin Currently without any anginal symptoms-his left-sided musculoskeletal chest pain is from rib fractures.  HTN BP on the higher side-continue amlodipine, bisoprolol, clonidine, doxazosin, losartan Will follow and optimize accordingly over the next several days  BMI: Estimated body mass index is 24.16 kg/m as calculated from the following:   Height as of an earlier encounter on 08/17/22: 5\' 6"  (1.676 m).   Weight as of this encounter: 67.9 kg.   Code status:   Code Status: Full Code   DVT Prophylaxis: Place TED hose Start: 08/17/22 2148   Family Communication: None at bedside   Disposition Plan: Status is: Inpatient Remains inpatient appropriate because: Severity of illness   Planned Discharge Destination:Home health   Diet: Diet Order  Diet regular Room service appropriate? Yes; Fluid consistency: Thin  Diet effective now                     Antimicrobial agents: Anti-infectives (From admission, onward)    None        MEDICATIONS: Scheduled Meds:  amiodarone  200 mg Oral Daily   amLODipine  5 mg  Oral Daily   bisoprolol  5 mg Oral BID   [START ON 08/20/2022] cloNIDine  0.1 mg Transdermal Q Fri   docusate sodium  100 mg Oral BID   doxazosin  8 mg Oral Daily   gentamicin cream  1 Application Topical Daily   lidocaine  1 patch Transdermal QHS   losartan  50 mg Oral Daily   nicotine  21 mg Transdermal Daily   polyethylene glycol  17 g Oral Daily   scopolamine  1 patch Transdermal Q72H   sevelamer carbonate  800 mg Oral TID WC   torsemide  100 mg Oral Daily   Continuous Infusions:  dialysis solution 1.5% low-MG/low-CA     promethazine (PHENERGAN) injection (IM or IVPB) 12.5 mg (08/18/22 0911)   PRN Meds:.acetaminophen **OR** acetaminophen, morphine injection, ondansetron **OR** ondansetron (ZOFRAN) IV, oxyCODONE, promethazine **OR** promethazine (PHENERGAN) injection (IM or IVPB) **OR** promethazine   I have personally reviewed following labs and imaging studies  LABORATORY DATA: CBC: Recent Labs  Lab 08/16/22 2116 08/17/22 0436  WBC 12.9* 11.4*  NEUTROABS 10.2*  --   HGB 9.9* 9.1*  HCT 30.2* 28.4*  MCV 88.3 87.7  PLT 165 148*    Basic Metabolic Panel: Recent Labs  Lab 08/16/22 2116 08/17/22 0436  NA 134* 138  K 5.0 5.1  CL 101 110  CO2 24 23  GLUCOSE 127* 96  BUN 48* 48*  CREATININE 7.41* 7.42*  CALCIUM 8.0* 7.8*  PHOS  --  6.6*    GFR: Estimated Creatinine Clearance: 7.6 mL/min (A) (by C-G formula based on SCr of 7.42 mg/dL (H)).  Liver Function Tests: Recent Labs  Lab 08/16/22 2116  AST 12*  ALT 12  ALKPHOS 107  BILITOT 0.5  PROT 5.2*  ALBUMIN 2.1*   No results for input(s): "LIPASE", "AMYLASE" in the last 168 hours. No results for input(s): "AMMONIA" in the last 168 hours.  Coagulation Profile: Recent Labs  Lab 08/17/22 0436  INR 1.2    Cardiac Enzymes: No results for input(s): "CKTOTAL", "CKMB", "CKMBINDEX", "TROPONINI" in the last 168 hours.  BNP (last 3 results) No results for input(s): "PROBNP" in the last 8760 hours.  Lipid  Profile: No results for input(s): "CHOL", "HDL", "LDLCALC", "TRIG", "CHOLHDL", "LDLDIRECT" in the last 72 hours.  Thyroid Function Tests: No results for input(s): "TSH", "T4TOTAL", "FREET4", "T3FREE", "THYROIDAB" in the last 72 hours.  Anemia Panel: No results for input(s): "VITAMINB12", "FOLATE", "FERRITIN", "TIBC", "IRON", "RETICCTPCT" in the last 72 hours.  Urine analysis:    Component Value Date/Time   COLORURINE STRAW (A) 11/28/2020 1055   APPEARANCEUR CLEAR (A) 11/28/2020 1055   APPEARANCEUR Clear 01/03/2012 1117   LABSPEC 1.008 11/28/2020 1055   LABSPEC 1.004 01/03/2012 1117   PHURINE 7.0 11/28/2020 1055   GLUCOSEU 50 (A) 11/28/2020 1055   GLUCOSEU 100 (A) 01/21/2020 1223   HGBUR NEGATIVE 11/28/2020 [place] 11/28/2020 1055   BILIRUBINUR neg 07/30/2013 1127   BILIRUBINUR Negative 01/03/2012 1117   KETONESUR NEGATIVE 11/28/2020 1055   PROTEINUR 30 (A) 11/28/2020 1055   UROBILINOGEN 0.2 01/21/2020 1223   NITRITE  NEGATIVE 11/28/2020 1055   LEUKOCYTESUR NEGATIVE 11/28/2020 1055   LEUKOCYTESUR Negative 01/03/2012 1117    Sepsis Labs: Lactic Acid, Venous    Component Value Date/Time   LATICACIDVEN 0.6 08/17/2022 0059    MICROBIOLOGY: No results found for this or any previous visit (from the past 240 hour(s)).  RADIOLOGY STUDIES/RESULTS: DG CHEST PORT 1 VIEW  Result Date: 08/17/2022 CLINICAL DATA:  Known left rib fractures and chest pain, initial encounter EXAM: PORTABLE CHEST 1 VIEW COMPARISON:  08/16/2022 FINDINGS: Cardiac shadow is enlarged but stable. Aortic calcifications are seen. Vascular congestion is noted centrally. Mild left pleural effusion is noted. Known left rib fractures are not well appreciated on this exam. No acute bony abnormality is noted. IMPRESSION: New small left effusion. Mild central vascular congestion. Electronically Signed   By: Inez Catalina M.D.   On: 08/17/2022 22:25   CT Abdomen Pelvis W Contrast  Result Date:  08/16/2022 CLINICAL DATA:  Abdominal pain, acute, nonlocalized LUQ pain s/p fall, free air seen on CT chest Pt to ED for fall 4 ft onto diesel fuel can, hitting left side. Pt unable to stand up or ambulate, holding left side, grunting EXAM: CT ABDOMEN AND PELVIS WITH CONTRAST TECHNIQUE: Multidetector CT imaging of the abdomen and pelvis was performed using the standard protocol following bolus administration of intravenous contrast. RADIATION DOSE REDUCTION: This exam was performed according to the departmental dose-optimization program which includes automated exposure control, adjustment of the mA and/or kV according to patient size and/or use of iterative reconstruction technique. CONTRAST:  11mL OMNIPAQUE IOHEXOL 300 MG/ML  SOLN COMPARISON:  CT chest 08/16/2022, CT abdomen pelvis 03/25/2022 FINDINGS: Lower chest: Bronchial wall thickening. Bilateral lower lobe subsegmental atelectasis. Bilateral trace pleural effusions. Aortic valve leaflet and coronary artery calcification. Cardiomegaly. Hepatobiliary: No focal liver abnormality. No gallstones, gallbladder wall thickening, or pericholecystic fluid. No biliary dilatation. Pancreas: No focal lesion. Normal pancreatic contour. No surrounding inflammatory changes. No main pancreatic ductal dilatation. Spleen: Normal in size without focal abnormality.  Spleen noted. Adrenals/Urinary Tract: No adrenal nodule bilaterally. Multiple fluid density lesions within the kidneys suggestive of simple renal cysts. Simple renal cysts, in the absence of clinically indicated signs/symptoms, require no independent follow-up. There is a 6.4 cm left cystic lesion with interval development of layering hyperdensity of 56 Hounsfield units suggestive of a hemorrhagic cyst. No hydronephrosis. No hydroureter. The urinary bladder is unremarkable. On delayed imaging, there is no excretion of intravenous contrast from either kidneys. Stomach/Bowel: Stomach is within normal limits. No  evidence of bowel wall thickening or dilatation. Diffuse colonic diverticulosis. Appendix appears normal. Vascular/Lymphatic: No abdominal aorta or iliac aneurysm. Extensive calcified and noncalcified atherosclerotic plaque of the aorta and its branche with diminutive lumen of the left common iliac artery down to 4 mm. Diminutive lumen of the infra-abdominal proximal abdominal aorta with caliber of 7 mm. No abdominal, pelvic, or inguinal lymphadenopathy. Reproductive: The prostate is enlarged measuring up to 4.9 cm. Other: Peritoneal dialysis catheter with tip terminating within the right pelvis. Trace free fluid within the upper abdomen and pelvis. Persistent moderate to large volume free intraperitoneal gas within the abdomen and pelvis. No organized fluid collection. No mesenteric fat stranding. Musculoskeletal: No chest wall abnormality. No suspicious lytic or blastic osseous lesions. No acute displaced fracture. Multilevel degenerative changes of the spine. IMPRESSION: 1. Persistent extensive pneumoperitoneum which is more than usual in the setting of a peritoneal dialysis catheter. Given patient has diffuse colonic diverticulosis, finding could be due to a peripheral diverticula;  however, no acute diverticulitis identified. No findings of inflammatory changes or other to suggest bowel perforation. 2. A 6.4 cm left cystic lesion with interval development of layering hyperdensity suggestive of a hemorrhagic cyst. Recommend short-term follow-up ultrasound for further evaluation. 3. Colonic diverticulosis with no acute diverticulitis. 4. Bilateral trace pleural effusion and trace simple free fluid ascites. 5.  Aortic Atherosclerosis (ICD10-I70.0) - extensive. 6. Please see separately dictated CT chest 08/15/2023. Electronically Signed   By: Iven Finn M.D.   On: 08/16/2022 21:30   CT Chest Wo Contrast  Result Date: 08/16/2022 CLINICAL DATA:  Chest trauma after a fall. Left anterior lower chest wall pain.  EXAM: CT CHEST WITHOUT CONTRAST TECHNIQUE: Multidetector CT imaging of the chest was performed following the standard protocol without IV contrast. RADIATION DOSE REDUCTION: This exam was performed according to the departmental dose-optimization program which includes automated exposure control, adjustment of the mA and/or kV according to patient size and/or use of iterative reconstruction technique. COMPARISON:  Chest radiograph 08/16/2022 FINDINGS: Cardiovascular: Cardiac enlargement. Small pericardial effusion. Diffuse calcification of the aorta and great vessels as well as the coronary arteries. No aortic aneurysm. Mediastinum/Nodes: Thyroid gland is unremarkable. Esophagus is decompressed. Mediastinal lymph nodes are not pathologically enlarged, likely reactive. Lungs/Pleura: Trace bilateral pleural effusions with basilar atelectasis bilaterally. Emphysematous changes in the lungs. Interstitial septal thickening with bronchial wall thickening likely representing chronic bronchitic change. No focal consolidation. No pneumothorax. Upper Abdomen: Free intraperitoneal air is demonstrated in the upper abdomen. In the absence of recent surgery, this is suspicious for bowel perforation. Heterogeneous enlargement and heterogeneous density of the left kidney suggest renal laceration and hematoma. Small amount of free fluid in the upper abdomen. Completion CT of the abdomen and pelvis with contrast is suggested. Musculoskeletal: Nondisplaced fractures of the left lateral seventh and eighth rib. Possible nondisplaced fracture of the left twelfth rib. Degenerative changes in the spine. No vertebral compression. IMPRESSION: 1. Nondisplaced fractures of the left lateral seventh and eighth ribs as well as possible fracture of the left twelfth rib. 2. Free air in the abdomen with small amount of free fluid likely indicating bowel perforation. 3. Heterogeneous enlargement and density in the left kidney suggesting renal  laceration with hematoma. 4. Completion CT of the abdomen and pelvis with contrast is suggested. 5. Trace bilateral pleural effusions. Emphysematous changes and fibrosis with bronchitic changes in the lungs. 6. Cardiac enlargement with small pericardial effusion. Critical Value/emergent results were called by telephone at the time of interpretation on 08/16/2022 at 8:27 pm to provider [facility] , who verbally acknowledged these results. Electronically Signed   By: Lucienne Capers M.D.   On: 08/16/2022 20:35   DG Chest Port 1 View  Result Date: 08/16/2022 CLINICAL DATA:  Fall EXAM: PORTABLE CHEST 1 VIEW COMPARISON:  Chest x-ray dated January 07, 2022 FINDINGS: Unchanged cardiomegaly. Mild diffuse interstitial opacities. No consolidation. Skin folds overlying the left hemithorax. No evidence of pleural effusion or pneumothorax. IMPRESSION: 1. No focal consolidation. 2. Mild diffuse interstitial opacities, likely due to pulmonary edema. Electronically Signed   By: Yetta Glassman M.D.   On: 08/16/2022 16:05     LOS: 1 day   Oren Binet, MD  [name]    To contact the attending provider between 7A-7P or the covering provider during after hours 7P-7A, please log into the web site [email or web address] and access using universal [place] password for that web site. If you do not have the password, please call the hospital operator.  08/18/2022, 9:23 AM

## 2022-08-18 NOTE — Consult Note (Signed)
Reason for Consult:L rib FX with concern for flail chest Referring Physician: Emilee Hero  [name] is an 77 y.o. male.  HPI: 77yo M with complex past medical history including COPD on peritoneal dialysis as well as atrial fibrillation on Eliquis was at a job site 3/25 when he fell and hit his left chest on a barrel.  He was evaluated at [facility] and found to have 2 left rib fractures as well as some hemoperitoneum.  He was admitted to the hospitalist service.  His abdominal exam remained completely benign and the free air was thought to be due to his peritoneal dialysis.  There was concern for him having a flail chest so transfer was requested for trauma evaluation.  The patient notes his left ribs are hurting somewhat but he is able to cough and is working on his I-S.  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 12/2018 Echo: EF 40-45%, global HK. Asc Ao 3.7cm; b. 01/2019 Lexi MV: small, mild, fixed basal and mid antlat defect - scar vs artifact. Small, mild mid and apical inf minimally reversible defect, likely scar w/ peri-infarct ischemia. Coronary and Ao atherosclerosis; c. 12/2019 Echo: EF 40-45%, glob HK, Gr1 DD. Nl RV fxn. Sev dil LA. Mild MR. Mild-mod Ao sclerosis w/o stenosis. Asc Ao 75mm.   Carotid arterial disease ([place])    a. 01/2020 RICA 1-39%, RCCA/RECA < [id number], LICA [id number], LCCA/LECA <50%.   Claudication ([place])    a. 02/2020 ABI/TBI: R 1.08/0.95, L 1.00/0.70.   Complication of anesthesia    had to be woken up slowly as his bp was elevated when did this quickly   Diabetes mellitus without complication (HCC)    Dysplastic nevus 02/07/2013   L upper back, moderate atypia   ESRD (end stage renal disease) ([place])    a. Peritoneal Dialysis pt.   Hyperlipidemia     Hypertension    Migraine    cluster   Neuromuscular disorder ([place])    left lower extrem neuropathy   Obstructive sleep apnea    no OSA since had facial surgery with dr. Kathyrn Sheriff in 1997   PAD (peripheral artery disease) ([place]) 06/2007   nonobstructing, renal angiogram ([place])   Renal cell carcinoma 2004   left kidney heminephrectomy   Renal insufficiency    Sepsis ([place]) 11/06/2020   Skin cancer    R dorsum hand   Stroke ([place])    a. 12/2019 MRI: Small acute infarcts involving the left cerebral hemisphere and several chronic infarcts.   TIA (transient ischemic attack)    no residual but left leg and foot still feel heavy   tobacco abuse     Past Surgical History:  Procedure Laterality Date   CAPD INSERTION N/A 03/01/2019   Procedure: LAPAROSCOPIC INSERTION CONTINUOUS AMBULATORY PERITONEAL DIALYSIS  (CAPD) CATHETER;  Surgeon: Algernon Huxley, MD;  Location: [facility] ORS;  Service: Vascular;  Laterality: N/A;   CARDIAC CATHETERIZATION     Dr. [name] did this to assess his renal artery   cyst removal  12/25/2015   Spine L4 and L5   heminephrectomy  2004   for renal cell CA   RENAL CRYOABLATION  Jan 2006   right kidney,  [facility]   RIGHT HEART CATH AND CORONARY ANGIOGRAPHY Bilateral  06/29/2022   Procedure: RIGHT HEART CATH AND CORONARY ANGIOGRAPHY;  Surgeon: [name], MD;  Location: [place];  Service: Cardiovascular;  Laterality: Bilateral;   sciatica      Family History  Problem Relation Age of Onset   Hypertension Mother    Cancer Mother        breast   Aneurysm Mother    Coronary artery disease Father    Hypertension Father    Stroke Father 31   Heart disease Father    Heart attack Father 69   Aneurysm Maternal Grandmother        brain   Aneurysm Paternal Grandmother        brain   Coronary artery disease Paternal Grandfather    Heart disease Brother        valvular heart disease   COPD Brother    Hypertension Brother    Stroke Paternal Uncle      Social History:  reports that he has been smoking cigarettes. He has a 50.00 pack-year smoking history. He has never used smokeless tobacco. He reports that he does not currently use alcohol. He reports that he does not use drugs.  Allergies:  Allergies  Allergen Reactions   Irbesartan Other (See Comments)    hyperkalemia   Cymbalta [Duloxetine Hcl]    Hydralazine Other (See Comments)    headache   Imdur [Isosorbide Nitrate] Other (See Comments)    headache   Rosuvastatin     Breast swelling/soreness   Atorvastatin Other (See Comments)    Muscle pain   Bystolic [Nebivolol Hcl]     Extreme fatigue     Medications: I have reviewed the patient's current medications.  Results for orders placed or performed during the hospital encounter of 08/16/22 (from the past 48 hour(s))  Comprehensive metabolic panel     Status: Abnormal   Collection Time: 08/16/22  9:16 PM  Result Value Ref Range   Sodium 134 (L) 135 - 145 mmol/L   Potassium 5.0 3.5 - 5.1 mmol/L   Chloride 101 98 - 111 mmol/L   CO2 24 22 - 32 mmol/L   Glucose, Bld 127 (H) 70 - 99 mg/dL    Comment: Glucose reference range applies only to samples taken after fasting for at least 8 hours.   BUN 48 (H) 8 - 23 mg/dL   Creatinine, Ser 7.41 (H) 0.61 - 1.24 mg/dL   Calcium 8.0 (L) 8.9 - 10.3 mg/dL   Total Protein 5.2 (L) 6.5 - 8.1 g/dL   Albumin 2.1 (L) 3.5 - 5.0 g/dL   AST 12 (L) 15 - 41 U/L   ALT 12 0 - 44 U/L   Alkaline Phosphatase 107 38 - 126 U/L   Total Bilirubin 0.5 0.3 - 1.2 mg/dL   GFR, Estimated 7 (L) >60 mL/min    Comment: (NOTE) Calculated using the CKD-EPI Creatinine Equation (2021)    Anion gap 9 5 - 15    Comment: Performed at [facility], [address] [place], [place] [address]  CBC with Differential     Status: Abnormal   Collection Time: 08/16/22  9:16 PM  Result Value Ref Range   WBC 12.9 (H) 4.0 - 10.5 K/uL   RBC 3.42 (L) 4.22 - 5.81 MIL/uL   Hemoglobin 9.9 (L) 13.0 - 17.0 g/dL    HCT 30.2 (L) 39.0 - 52.0 %   MCV 88.3 80.0 - 100.0 fL   MCH 28.9 26.0 - 34.0 pg   MCHC  32.8 30.0 - 36.0 g/dL   RDW 14.7 11.5 - 15.5 %   Platelets 165 150 - 400 K/uL   nRBC 0.0 0.0 - 0.2 %   Neutrophils Relative % 78 %   Neutro Abs 10.2 (H) 1.7 - 7.7 K/uL   Lymphocytes Relative 12 %   Lymphs Abs 1.5 0.7 - 4.0 K/uL   Monocytes Relative 5 %   Monocytes Absolute 0.7 0.1 - 1.0 K/uL   Eosinophils Relative 3 %   Eosinophils Absolute 0.3 0.0 - 0.5 K/uL   Basophils Relative 1 %   Basophils Absolute 0.1 0.0 - 0.1 K/uL   Immature Granulocytes 1 %   Abs Immature Granulocytes 0.10 (H) 0.00 - 0.07 K/uL    Comment: Performed at [facility], [address] [place], [place] [address]  Lactic acid, plasma     Status: None   Collection Time: 08/16/22  9:16 PM  Result Value Ref Range   Lactic Acid, Venous 0.5 0.5 - 1.9 mmol/L    Comment: Performed at [facility], [address] [place], [place] [address]  Lactic acid, plasma     Status: None   Collection Time: 08/17/22 12:59 AM  Result Value Ref Range   Lactic Acid, Venous 0.6 0.5 - 1.9 mmol/L    Comment: Performed at [facility], [address] [place], [place] [address]  Heparin level (unfractionated)     Status: Abnormal   Collection Time: 08/17/22 12:59 AM  Result Value Ref Range   Heparin Unfractionated <0.10 (L) 0.30 - 0.70 IU/mL    Comment: REPEATED TO VERIFY Performed at [facility], [address] [place], [place] [address]   APTT     Status: None   Collection Time: 08/17/22 12:59 AM  Result Value Ref Range   aPTT 32 24 - 36 seconds    Comment: Performed at [facility], [address] [place], [place] [address]  CBC     Status: Abnormal   Collection Time: 08/17/22  4:36 AM  Result Value Ref Range   WBC 11.4 (H) 4.0 - 10.5 K/uL   RBC 3.24 (L) 4.22 - 5.81 MIL/uL   Hemoglobin 9.1 (L) 13.0 - 17.0 g/dL   HCT 28.4 (L) 39.0 - 52.0 %   MCV 87.7 80.0 - 100.0 fL   MCH 28.1 26.0  - 34.0 pg   MCHC 32.0 30.0 - 36.0 g/dL   RDW 14.9 11.5 - 15.5 %   Platelets 148 (L) 150 - 400 K/uL   nRBC 0.0 0.0 - 0.2 %    Comment: Performed at [facility], [address] [place], [place] [address]  Phosphorus     Status: Abnormal   Collection Time: 08/17/22  4:36 AM  Result Value Ref Range   Phosphorus 6.6 (H) 2.5 - 4.6 mg/dL    Comment: Performed at [facility], [address] [place], [place] [address]  Protime-INR     Status: None   Collection Time: 08/17/22  4:36 AM  Result Value Ref Range   Prothrombin Time 14.7 11.4 - 15.2 seconds   INR 1.2 0.8 - 1.2    Comment: (NOTE) INR goal varies based on device and disease states. Performed at [facility], [address] [place], [place] [address]   APTT     Status: None   Collection Time: 08/17/22  4:36 AM  Result Value Ref Range   aPTT 33 24 - 36 seconds    Comment: Performed at  [facility], [address] [place], [place] [id number]  Basic metabolic panel     Status: Abnormal   Collection Time: 08/17/22  4:36 AM  Result Value Ref Range   Sodium 138 135 - 145 mmol/L   Potassium 5.1 3.5 - 5.1 mmol/L   Chloride 110 98 - 111 mmol/L   CO2 23 22 - 32 mmol/L   Glucose, Bld 96 70 - 99 mg/dL    Comment: Glucose reference range applies only to samples taken after fasting for at least 8 hours.   BUN 48 (H) 8 - 23 mg/dL   Creatinine, Ser 7.42 (H) 0.61 - 1.24 mg/dL   Calcium 7.8 (L) 8.9 - 10.3 mg/dL   GFR, Estimated 7 (L) >60 mL/min    Comment: (NOTE) Calculated using the CKD-EPI Creatinine Equation (2021)    Anion gap 5 5 - 15    Comment: Performed at [facility], [address] [place], [place] [address]  Glucose, capillary     Status: None   Collection Time: 08/17/22  8:47 AM  Result Value Ref Range   Glucose-Capillary 83 70 - 99 mg/dL    Comment: Glucose reference range applies only to samples taken after fasting for at least 8 hours.    DG CHEST PORT 1  VIEW  Result Date: 08/17/2022 CLINICAL DATA:  Known left rib fractures and chest pain, initial encounter EXAM: PORTABLE CHEST 1 VIEW COMPARISON:  08/16/2022 FINDINGS: Cardiac shadow is enlarged but stable. Aortic calcifications are seen. Vascular congestion is noted centrally. Mild left pleural effusion is noted. Known left rib fractures are not well appreciated on this exam. No acute bony abnormality is noted. IMPRESSION: New small left effusion. Mild central vascular congestion. Electronically Signed   By: Inez Catalina M.D.   On: 08/17/2022 22:25   CT Abdomen Pelvis W Contrast  Result Date: 08/16/2022 CLINICAL DATA:  Abdominal pain, acute, nonlocalized LUQ pain s/p fall, free air seen on CT chest Pt to ED for fall 4 ft onto diesel fuel can, hitting left side. Pt unable to stand up or ambulate, holding left side, grunting EXAM: CT ABDOMEN AND PELVIS WITH CONTRAST TECHNIQUE: Multidetector CT imaging of the abdomen and pelvis was performed using the standard protocol following bolus administration of intravenous contrast. RADIATION DOSE REDUCTION: This exam was performed according to the departmental dose-optimization program which includes automated exposure control, adjustment of the mA and/or kV according to patient size and/or use of iterative reconstruction technique. CONTRAST:  159mL OMNIPAQUE IOHEXOL 300 MG/ML  SOLN COMPARISON:  CT chest 08/16/2022, CT abdomen pelvis 03/25/2022 FINDINGS: Lower chest: Bronchial wall thickening. Bilateral lower lobe subsegmental atelectasis. Bilateral trace pleural effusions. Aortic valve leaflet and coronary artery calcification. Cardiomegaly. Hepatobiliary: No focal liver abnormality. No gallstones, gallbladder wall thickening, or pericholecystic fluid. No biliary dilatation. Pancreas: No focal lesion. Normal pancreatic contour. No surrounding inflammatory changes. No main pancreatic ductal dilatation. Spleen: Normal in size without focal abnormality.  Spleen noted.  Adrenals/Urinary Tract: No adrenal nodule bilaterally. Multiple fluid density lesions within the kidneys suggestive of simple renal cysts. Simple renal cysts, in the absence of clinically indicated signs/symptoms, require no independent follow-up. There is a 6.4 cm left cystic lesion with interval development of layering hyperdensity of 56 Hounsfield units suggestive of a hemorrhagic cyst. No hydronephrosis. No hydroureter. The urinary bladder is unremarkable. On delayed imaging, there is no excretion of intravenous contrast from either kidneys. Stomach/Bowel: Stomach is within normal limits. No evidence of bowel wall thickening or dilatation.  Diffuse colonic diverticulosis. Appendix appears normal. Vascular/Lymphatic: No abdominal aorta or iliac aneurysm. Extensive calcified and noncalcified atherosclerotic plaque of the aorta and its branche with diminutive lumen of the left common iliac artery down to 4 mm. Diminutive lumen of the infra-abdominal proximal abdominal aorta with caliber of 7 mm. No abdominal, pelvic, or inguinal lymphadenopathy. Reproductive: The prostate is enlarged measuring up to 4.9 cm. Other: Peritoneal dialysis catheter with tip terminating within the right pelvis. Trace free fluid within the upper abdomen and pelvis. Persistent moderate to large volume free intraperitoneal gas within the abdomen and pelvis. No organized fluid collection. No mesenteric fat stranding. Musculoskeletal: No chest wall abnormality. No suspicious lytic or blastic osseous lesions. No acute displaced fracture. Multilevel degenerative changes of the spine. IMPRESSION: 1. Persistent extensive pneumoperitoneum which is more than usual in the setting of a peritoneal dialysis catheter. Given patient has diffuse colonic diverticulosis, finding could be due to a peripheral diverticula; however, no acute diverticulitis identified. No findings of inflammatory changes or other to suggest bowel perforation. 2. A 6.4 cm left  cystic lesion with interval development of layering hyperdensity suggestive of a hemorrhagic cyst. Recommend short-term follow-up ultrasound for further evaluation. 3. Colonic diverticulosis with no acute diverticulitis. 4. Bilateral trace pleural effusion and trace simple free fluid ascites. 5.  Aortic Atherosclerosis (ICD10-I70.0) - extensive. 6. Please see separately dictated CT chest 08/15/2023. Electronically Signed   By: Iven Finn M.D.   On: 08/16/2022 21:30   CT Chest Wo Contrast  Result Date: 08/16/2022 CLINICAL DATA:  Chest trauma after a fall. Left anterior lower chest wall pain. EXAM: CT CHEST WITHOUT CONTRAST TECHNIQUE: Multidetector CT imaging of the chest was performed following the standard protocol without IV contrast. RADIATION DOSE REDUCTION: This exam was performed according to the departmental dose-optimization program which includes automated exposure control, adjustment of the mA and/or kV according to patient size and/or use of iterative reconstruction technique. COMPARISON:  Chest radiograph 08/16/2022 FINDINGS: Cardiovascular: Cardiac enlargement. Small pericardial effusion. Diffuse calcification of the aorta and great vessels as well as the coronary arteries. No aortic aneurysm. Mediastinum/Nodes: Thyroid gland is unremarkable. Esophagus is decompressed. Mediastinal lymph nodes are not pathologically enlarged, likely reactive. Lungs/Pleura: Trace bilateral pleural effusions with basilar atelectasis bilaterally. Emphysematous changes in the lungs. Interstitial septal thickening with bronchial wall thickening likely representing chronic bronchitic change. No focal consolidation. No pneumothorax. Upper Abdomen: Free intraperitoneal air is demonstrated in the upper abdomen. In the absence of recent surgery, this is suspicious for bowel perforation. Heterogeneous enlargement and heterogeneous density of the left kidney suggest renal laceration and hematoma. Small amount of free fluid  in the upper abdomen. Completion CT of the abdomen and pelvis with contrast is suggested. Musculoskeletal: Nondisplaced fractures of the left lateral seventh and eighth rib. Possible nondisplaced fracture of the left twelfth rib. Degenerative changes in the spine. No vertebral compression. IMPRESSION: 1. Nondisplaced fractures of the left lateral seventh and eighth ribs as well as possible fracture of the left twelfth rib. 2. Free air in the abdomen with small amount of free fluid likely indicating bowel perforation. 3. Heterogeneous enlargement and density in the left kidney suggesting renal laceration with hematoma. 4. Completion CT of the abdomen and pelvis with contrast is suggested. 5. Trace bilateral pleural effusions. Emphysematous changes and fibrosis with bronchitic changes in the lungs. 6. Cardiac enlargement with small pericardial effusion. Critical Value/emergent results were called by telephone at the time of interpretation on 08/16/2022 at 8:27 pm to provider [facility] ,  who verbally acknowledged these results. Electronically Signed   By: Lucienne Capers M.D.   On: 08/16/2022 20:35   DG Chest Port 1 View  Result Date: 08/16/2022 CLINICAL DATA:  Fall EXAM: PORTABLE CHEST 1 VIEW COMPARISON:  Chest x-ray dated January 07, 2022 FINDINGS: Unchanged cardiomegaly. Mild diffuse interstitial opacities. No consolidation. Skin folds overlying the left hemithorax. No evidence of pleural effusion or pneumothorax. IMPRESSION: 1. No focal consolidation. 2. Mild diffuse interstitial opacities, likely due to pulmonary edema. Electronically Signed   By: Yetta Glassman M.D.   On: 08/16/2022 16:05    Review of Systems  Constitutional: Negative.   HENT: Negative.    Eyes: Negative.   Respiratory: Negative.  Negative for shortness of breath.   Cardiovascular:  Positive for chest pain.  Gastrointestinal: Negative.   Endocrine: Negative.   Genitourinary: Negative.   Musculoskeletal: Negative.    Allergic/Immunologic: Negative.   Neurological: Negative.   Hematological: Negative.   Psychiatric/Behavioral: Negative.     Blood pressure (!) 160/72, pulse (!) 49, temperature 98.1 F (36.7 C), resp. rate 19, weight 67.9 kg, SpO2 98 %. Physical Exam HENT:     Head: Normocephalic.     Mouth/Throat:     Mouth: Mucous membranes are moist.  Eyes:     Pupils: Pupils are equal, round, and reactive to light.  Cardiovascular:     Rate and Rhythm: Normal rate. Rhythm irregular.     Pulses: Normal pulses.  Pulmonary:     Effort: Pulmonary effort is normal.     Breath sounds: Normal breath sounds.     Comments: I do not see any flail movement Chest:     Chest wall: Tenderness present.  Abdominal:     General: There is distension.     Tenderness: There is no abdominal tenderness. There is no guarding or rebound.     Comments: CAPD ongoing  Musculoskeletal:        General: No tenderness.     Cervical back: No tenderness.  Skin:    General: Skin is warm.     Capillary Refill: Capillary refill takes 2 to 3 seconds.  Neurological:     Mental Status: He is alert and oriented to person, place, and time.  Psychiatric:        Mood and Affect: Mood normal.     Assessment/Plan: Fall onto a barrel 3/25 Left rib fractures 7,8 -follow-up chest x-ray shows very small effusion.  Recommend pulmonary toilet, multimodal pain control, and PT evaluation.  We will follow.  Eliquis is on hold. Free air due to COPD Multiple medical problems Zenovia Jarred 08/18/2022, 2:13 AM

## 2022-08-18 NOTE — Evaluation (Signed)
Physical Therapy Evaluation Patient Details Name: [name] MRN: [id number] DOB: 02/01/1946 Today's Date: 08/18/2022  History of Present Illness  Pt is a 77 y/o M admitted to [facility] from [facility] on 3/25 after a fall resulting in nondisplaced L 7th and 8th rib fractures, seen on chest CT. Imaging also revealed free air in abdomen. PMHx: CHF, A-fib, ESRD on peritoneal dialysis, HTN, HLD, CVA  Clinical Impression  Pt tolerated today's session fairly well, limited by fatigue but able to ambulate in the hallway. Pt reports pain in his ribs with coughing but tolerating transfers well. MinG-minA provided throughout session for safety, mild-moderate imbalance noted with ambulation, pt reporting the medicine makes him feel off, noted mild desat to 86-87% with mobility but recovering with seated and standing rest breaks and pursed lip breathing. At baseline, pt is independent with all mobility with no AD, no O2, still working and driving. Pt will continue to benefit from skilled acute PT at this time to progress mobility and activity tolerance, recommend HHPT currently for discharge but anticipate possible progression to no needs pending improved balance and endurance. PT will follow as appropriate.      Recommendations for follow up therapy are one component of a multi-disciplinary discharge planning process, led by the attending physician.  Recommendations may be updated based on patient status, additional functional criteria and insurance authorization.  Follow Up Recommendations       Assistance Recommended at Discharge Frequent or constant Supervision/Assistance  Patient can return home with the following  A little help with walking and/or transfers;Assist for transportation;Help with stairs or ramp for entrance    Equipment Recommendations Other (comment) (will continue to assess)  Recommendations for Other Services       Functional Status Assessment Patient has had a recent decline  in their functional status and demonstrates the ability to make significant improvements in function in a reasonable and predictable amount of time.     Precautions / Restrictions Precautions Precautions: Fall;Other (comment) Precaution Comments: L 7/8 rib fracture, peritoneal dialysis Restrictions Weight Bearing Restrictions: No      Mobility  Bed Mobility Overal bed mobility: Needs Assistance Bed Mobility: Supine to Sit     Supine to sit: Min assist, HOB elevated     General bed mobility comments: minA for trunk support, pt able to pivot hips to EOB with supervision and increased time    Transfers Overall transfer level: Needs assistance Equipment used: None, 1 person hand held assist Transfers: Sit to/from Stand, Bed to chair/wheelchair/BSC Sit to Stand: Min guard Stand pivot transfers: Min guard         General transfer comment: minG to stand from bed and chair for safety, stand pivot to chair with minG for safety and 1HHA    Ambulation/Gait Ambulation/Gait assistance: Min guard, Min assist Gait Distance (Feet): 80 Feet (with one standing rest break) Assistive device: None Gait Pattern/deviations: Step-through pattern, Decreased stride length, Drifts right/left, Narrow base of support Gait velocity: decreased     General Gait Details: gait with mild-moderate instability, 1 LOB with minA to correct but minG provided throughout for close guarding. Varying gait speed, noted desat with ambulation to 87% on 2L, recovering with standing rest break and desatting once more when back in the room to 86% but recovers nicely with seated rest break and cueing for pursed lip breathing  Stairs            Wheelchair Mobility    Modified Rankin (Stroke Patients Only)  Balance Overall balance assessment: Needs assistance Sitting-balance support: No upper extremity supported, Feet supported Sitting balance-Leahy Scale: Fair     Standing balance support: No  upper extremity supported, During functional activity Standing balance-Leahy Scale: Poor Standing balance comment: 1 mild LOB and mild imbalance with ambulation without AD                             Pertinent Vitals/Pain Pain Assessment Pain Assessment: 0-10 Pain Score: 4  Pain Location: ribs Pain Descriptors / Indicators: Aching, Grimacing, Guarding Pain Intervention(s): Limited activity within patient's tolerance, Monitored during session, Repositioned    Home Living Family/patient expects to be discharged to:: Private residence Living Arrangements: Spouse/significant other Available Help at Discharge: Family;Available 24 hours/day Type of Home: House Home Access: Stairs to enter Entrance Stairs-Rails: None Entrance Stairs-Number of Steps: 2   Home Layout: Two level;Able to live on main level with bedroom/bathroom Home Equipment: None Additional Comments: wife is available 24/7, pt resides on main level of house and doesn't typically go up to the second level. Reports slightly elevated toilet    Prior Function Prior Level of Function : Independent/Modified Independent;Driving;Working/employed             Mobility Comments: independent with all mobility, denies falls other than the one that brought him in       Hand Dominance   Dominant Hand: Right    Extremity/Trunk Assessment   Upper Extremity Assessment Upper Extremity Assessment: Defer to OT evaluation    Lower Extremity Assessment Lower Extremity Assessment: Overall WFL for tasks assessed    Cervical / Trunk Assessment Cervical / Trunk Assessment: Kyphotic  Communication   Communication: No difficulties (slightly soft spoken)  Cognition Arousal/Alertness: Awake/alert Behavior During Therapy: Flat affect Overall Cognitive Status: No family/caregiver present to determine baseline cognitive functioning                                 General Comments: Pt A&Ox4, reports medicine  is making him feel a little different, pleasant throughout but quiet        General Comments General comments (skin integrity, edema, etc.): desat with mobility on 2L O2 [place], recovers with rest breaks and cueing for pursed lip breathing    Exercises     Assessment/Plan    PT Assessment Patient needs continued PT services  PT Problem List Decreased activity tolerance;Decreased balance;Decreased mobility;Decreased safety awareness;Decreased knowledge of use of DME;Decreased knowledge of precautions;Cardiopulmonary status limiting activity       PT Treatment Interventions DME instruction;Gait training;Stair training;Functional mobility training;Therapeutic activities;Therapeutic exercise;Balance training;Neuromuscular re-education;Patient/family education    PT Goals (Current goals can be found in the Care Plan section)  Acute Rehab PT Goals Patient Stated Goal: go home PT Goal Formulation: With patient Time For Goal Achievement: 09/01/22 Potential to Achieve Goals: Good    Frequency Min 3X/week     Co-evaluation               AM-PAC PT "6 Clicks" Mobility  Outcome Measure Help needed turning from your back to your side while in a flat bed without using bedrails?: A Little Help needed moving from lying on your back to sitting on the side of a flat bed without using bedrails?: A Little Help needed moving to and from a bed to a chair (including a wheelchair)?: A Little Help needed standing up from a chair using  your arms (e.g., wheelchair or bedside chair)?: A Little Help needed to walk in hospital room?: A Little Help needed climbing 3-5 steps with a railing? : A Lot 6 Click Score: 17    End of Session Equipment Utilized During Treatment: Gait belt;Oxygen Activity Tolerance: Patient limited by fatigue Patient left: in chair;with call bell/phone within reach;with chair alarm set Nurse Communication: Mobility status PT Visit Diagnosis: Unsteadiness on feet  (R26.81);History of falling (Z91.81);Difficulty in walking, not elsewhere classified (R26.2)    Time: [id number] PT Time Calculation (min) (ACUTE ONLY): 18 min   Charges:   PT Evaluation $PT Eval Low Complexity: 1 Low          Charlynne Cousins, PT DPT Acute Rehabilitation Services Office [phone]   [name] 08/18/2022, 1:22 PM

## 2022-08-18 NOTE — Progress Notes (Signed)
TRH night cross cover note:   I was notified by RN that this patient sounds wet on exam. I evaluated the patient at bedside and I agree with this assessment. He has an existing order for torsemide 100 mg p.o. daily.  I have retimed this order so that next dose occurs now, and also added a scopolamine patch.  Trauma surgery has also seen patient and conveys need to emphasize pulmonary toilet. I've also added incentive spirometry as well as flutter valve.    Babs Bertin, DO Hospitalist

## 2022-08-18 NOTE — Progress Notes (Signed)
Pre CCPD Tx   08/18/22 0054  Vitals  Temp 98.1 F (36.7 C)  Pulse Rate (!) 49  Resp 19  BP (!) 160/72  SpO2 98 %  O2 Device Nasal Cannula  Weight 67.9 kg  Type of Weight Pre-Dialysis  Oxygen Therapy  O2 Flow Rate (L/min) 2 L/min  Patient Activity (if Appropriate) In bed   Exit site care performed, gentamicin cream applied with new dressing.

## 2022-08-18 NOTE — Progress Notes (Signed)
Progress Note     Subjective: Says he is feeling worse today than yesterday but attributes some this to being on PD which he hasn't since Sunday. He has a productive cough. No SHOB on supplemental O2 this am   Objective: Vital signs in last 24 hours: Temp:  [97.4 F (36.3 C)-98.1 F (36.7 C)] 98 F (36.7 C) (03/27 0400) Pulse Rate:  [47-64] 52 (03/27 0400) Resp:  [14-19] 14 (03/27 0400) BP: (136-179)/(52-93) 179/67 (03/27 0400) SpO2:  [90 %-98 %] 95 % (03/27 0400) Weight:  [67.9 kg] 67.9 kg (03/27 0054)    Intake/Output from previous day: No intake/output data recorded. Intake/Output this shift: No intake/output data recorded.  PE: General: pleasant, WD, male who is laying in bed in NAD Heart: regular, rate, and rhythm. Lungs: bilateral wheeze on auscultation. Respiratory effort nonlabored on 2 lpm O2. Pulls 750 on IS Abd: soft, NT, distended, +BS, PD in process MSK: all 4 extremities are symmetrical with no cyanosis, clubbing, or edema. Skin: warm and dry Psych: A&Ox3 with an appropriate affect.    Lab Results:  Recent Labs    08/16/22 2116 08/17/22 0436  WBC 12.9* 11.4*  HGB 9.9* 9.1*  HCT 30.2* 28.4*  PLT 165 148*   BMET Recent Labs    08/16/22 2116 08/17/22 0436  NA 134* 138  K 5.0 5.1  CL 101 110  CO2 24 23  GLUCOSE 127* 96  BUN 48* 48*  CREATININE 7.41* 7.42*  CALCIUM 8.0* 7.8*   PT/INR Recent Labs    08/17/22 0436  LABPROT 14.7  INR 1.2   CMP     Component Value Date/Time   NA 138 08/17/2022 0436   NA 142 09/09/2021 1149   NA 139 07/04/2012 1117   K 5.1 08/17/2022 0436   K 4.3 07/04/2012 1117   CL 110 08/17/2022 0436   CL 102 07/04/2012 1117   CO2 23 08/17/2022 0436   CO2 29 07/04/2012 1117   GLUCOSE 96 08/17/2022 0436   GLUCOSE 88 07/04/2012 1117   BUN 48 (H) 08/17/2022 0436   BUN 51 (H) 09/09/2021 1149   BUN 15 07/04/2012 1117   CREATININE 7.42 (H) 08/17/2022 0436   CREATININE 1.92 (H) 07/18/2015 1549   CALCIUM 7.8 (L)  08/17/2022 0436   CALCIUM 9.1 07/04/2012 1117   PROT 5.2 (L) 08/16/2022 2116   PROT 5.2 (L) 09/09/2021 1149   PROT 8.0 01/03/2012 1000   ALBUMIN 2.1 (L) 08/16/2022 2116   ALBUMIN 2.9 (L) 09/09/2021 1149   ALBUMIN 3.7 01/03/2012 1000   AST 12 (L) 08/16/2022 2116   AST 18 01/03/2012 1000   ALT 12 08/16/2022 2116   ALT 19 01/03/2012 1000   ALKPHOS 107 08/16/2022 2116   ALKPHOS 179 (H) 01/03/2012 1000   BILITOT 0.5 08/16/2022 2116   BILITOT <0.2 09/09/2021 1149   BILITOT 0.5 01/03/2012 1000   GFRNONAA 7 (L) 08/17/2022 0436   GFRNONAA 40 (L) 07/04/2012 1117   GFRAA 7 (L) 01/14/2020 0813   GFRAA 47 (L) 07/04/2012 1117   Lipase     Component Value Date/Time   LIPASE 28 11/06/2020 0651       Studies/Results: DG CHEST PORT 1 VIEW  Result Date: 08/17/2022 CLINICAL DATA:  Known left rib fractures and chest pain, initial encounter EXAM: PORTABLE CHEST 1 VIEW COMPARISON:  08/16/2022 FINDINGS: Cardiac shadow is enlarged but stable. Aortic calcifications are seen. Vascular congestion is noted centrally. Mild left pleural effusion is noted. Known left rib fractures are  not well appreciated on this exam. No acute bony abnormality is noted. IMPRESSION: New small left effusion. Mild central vascular congestion. Electronically Signed   By: Inez Catalina M.D.   On: 08/17/2022 22:25   CT Abdomen Pelvis W Contrast  Result Date: 08/16/2022 CLINICAL DATA:  Abdominal pain, acute, nonlocalized LUQ pain s/p fall, free air seen on CT chest Pt to ED for fall 4 ft onto diesel fuel can, hitting left side. Pt unable to stand up or ambulate, holding left side, grunting EXAM: CT ABDOMEN AND PELVIS WITH CONTRAST TECHNIQUE: Multidetector CT imaging of the abdomen and pelvis was performed using the standard protocol following bolus administration of intravenous contrast. RADIATION DOSE REDUCTION: This exam was performed according to the departmental dose-optimization program which includes automated exposure control,  adjustment of the mA and/or kV according to patient size and/or use of iterative reconstruction technique. CONTRAST:  113mL OMNIPAQUE IOHEXOL 300 MG/ML  SOLN COMPARISON:  CT chest 08/16/2022, CT abdomen pelvis 03/25/2022 FINDINGS: Lower chest: Bronchial wall thickening. Bilateral lower lobe subsegmental atelectasis. Bilateral trace pleural effusions. Aortic valve leaflet and coronary artery calcification. Cardiomegaly. Hepatobiliary: No focal liver abnormality. No gallstones, gallbladder wall thickening, or pericholecystic fluid. No biliary dilatation. Pancreas: No focal lesion. Normal pancreatic contour. No surrounding inflammatory changes. No main pancreatic ductal dilatation. Spleen: Normal in size without focal abnormality.  Spleen noted. Adrenals/Urinary Tract: No adrenal nodule bilaterally. Multiple fluid density lesions within the kidneys suggestive of simple renal cysts. Simple renal cysts, in the absence of clinically indicated signs/symptoms, require no independent follow-up. There is a 6.4 cm left cystic lesion with interval development of layering hyperdensity of 56 Hounsfield units suggestive of a hemorrhagic cyst. No hydronephrosis. No hydroureter. The urinary bladder is unremarkable. On delayed imaging, there is no excretion of intravenous contrast from either kidneys. Stomach/Bowel: Stomach is within normal limits. No evidence of bowel wall thickening or dilatation. Diffuse colonic diverticulosis. Appendix appears normal. Vascular/Lymphatic: No abdominal aorta or iliac aneurysm. Extensive calcified and noncalcified atherosclerotic plaque of the aorta and its branche with diminutive lumen of the left common iliac artery down to 4 mm. Diminutive lumen of the infra-abdominal proximal abdominal aorta with caliber of 7 mm. No abdominal, pelvic, or inguinal lymphadenopathy. Reproductive: The prostate is enlarged measuring up to 4.9 cm. Other: Peritoneal dialysis catheter with tip terminating within the  right pelvis. Trace free fluid within the upper abdomen and pelvis. Persistent moderate to large volume free intraperitoneal gas within the abdomen and pelvis. No organized fluid collection. No mesenteric fat stranding. Musculoskeletal: No chest wall abnormality. No suspicious lytic or blastic osseous lesions. No acute displaced fracture. Multilevel degenerative changes of the spine. IMPRESSION: 1. Persistent extensive pneumoperitoneum which is more than usual in the setting of a peritoneal dialysis catheter. Given patient has diffuse colonic diverticulosis, finding could be due to a peripheral diverticula; however, no acute diverticulitis identified. No findings of inflammatory changes or other to suggest bowel perforation. 2. A 6.4 cm left cystic lesion with interval development of layering hyperdensity suggestive of a hemorrhagic cyst. Recommend short-term follow-up ultrasound for further evaluation. 3. Colonic diverticulosis with no acute diverticulitis. 4. Bilateral trace pleural effusion and trace simple free fluid ascites. 5.  Aortic Atherosclerosis (ICD10-I70.0) - extensive. 6. Please see separately dictated CT chest 08/15/2023. Electronically Signed   By: Iven Finn M.D.   On: 08/16/2022 21:30   CT Chest Wo Contrast  Result Date: 08/16/2022 CLINICAL DATA:  Chest trauma after a fall. Left anterior lower chest wall pain.  EXAM: CT CHEST WITHOUT CONTRAST TECHNIQUE: Multidetector CT imaging of the chest was performed following the standard protocol without IV contrast. RADIATION DOSE REDUCTION: This exam was performed according to the departmental dose-optimization program which includes automated exposure control, adjustment of the mA and/or kV according to patient size and/or use of iterative reconstruction technique. COMPARISON:  Chest radiograph 08/16/2022 FINDINGS: Cardiovascular: Cardiac enlargement. Small pericardial effusion. Diffuse calcification of the aorta and great vessels as well as the  coronary arteries. No aortic aneurysm. Mediastinum/Nodes: Thyroid gland is unremarkable. Esophagus is decompressed. Mediastinal lymph nodes are not pathologically enlarged, likely reactive. Lungs/Pleura: Trace bilateral pleural effusions with basilar atelectasis bilaterally. Emphysematous changes in the lungs. Interstitial septal thickening with bronchial wall thickening likely representing chronic bronchitic change. No focal consolidation. No pneumothorax. Upper Abdomen: Free intraperitoneal air is demonstrated in the upper abdomen. In the absence of recent surgery, this is suspicious for bowel perforation. Heterogeneous enlargement and heterogeneous density of the left kidney suggest renal laceration and hematoma. Small amount of free fluid in the upper abdomen. Completion CT of the abdomen and pelvis with contrast is suggested. Musculoskeletal: Nondisplaced fractures of the left lateral seventh and eighth rib. Possible nondisplaced fracture of the left twelfth rib. Degenerative changes in the spine. No vertebral compression. IMPRESSION: 1. Nondisplaced fractures of the left lateral seventh and eighth ribs as well as possible fracture of the left twelfth rib. 2. Free air in the abdomen with small amount of free fluid likely indicating bowel perforation. 3. Heterogeneous enlargement and density in the left kidney suggesting renal laceration with hematoma. 4. Completion CT of the abdomen and pelvis with contrast is suggested. 5. Trace bilateral pleural effusions. Emphysematous changes and fibrosis with bronchitic changes in the lungs. 6. Cardiac enlargement with small pericardial effusion. Critical Value/emergent results were called by telephone at the time of interpretation on 08/16/2022 at 8:27 pm to provider [facility] , who verbally acknowledged these results. Electronically Signed   By: Lucienne Capers M.D.   On: 08/16/2022 20:35   DG Chest Port 1 View  Result Date: 08/16/2022 CLINICAL DATA:  Fall EXAM:  PORTABLE CHEST 1 VIEW COMPARISON:  Chest x-ray dated January 07, 2022 FINDINGS: Unchanged cardiomegaly. Mild diffuse interstitial opacities. No consolidation. Skin folds overlying the left hemithorax. No evidence of pleural effusion or pneumothorax. IMPRESSION: 1. No focal consolidation. 2. Mild diffuse interstitial opacities, likely due to pulmonary edema. Electronically Signed   By: Yetta Glassman M.D.   On: 08/16/2022 16:05    Anti-infectives: Anti-infectives (From admission, onward)    None        Assessment/Plan Fall onto a barrel 3/25  Left rib fractures 7,8 -follow-up chest x-ray shows very small effusion.  continue pulmonary toilet, multimodal pain control, and PT evaluation. Wean O2 as able Free air due to COPD Tobacco Use - nicotine patch Afib/aflutter - eliquis at baseline ESRD on PD  HTN  FEN: reg ID: none VTE: okay to resume eliquis  Dispo: therapies and pain control today. Trauma team will sign off but please contact or reconsult for any question or concerns  I reviewed last 24 h vitals and pain scores, last 48 h intake and output, last 24 h labs and trends, and last 24 h imaging results.    LOS: 1 day   [facility] 08/18/2022, 8:11 AM Please see Amion for pager number during day hours 7:00am-4:30pm

## 2022-08-18 NOTE — TOC CAGE-AID Note (Addendum)
Transition of Care [facility]) - CAGE-AID Screening   Patient Details  Name: [name] MRN: [id number] Date of Birth: [birth year]  Transition of Care [facility]) CM/SW Contact:    Clovis Cao, RN Phone Number: [phone] 08/18/2022, 10:07 AM   Clinical Narrative: Pt is a trauma consult due to rib fractures.  Pt states that he does smoke cigarettes (nicotine patch prescribed currently).  Pt states that he does not drink alcohol and does not do drugs.  Screening complete.   CAGE-AID Screening:    Have You Ever Felt You Ought to Cut Down on Your Drinking or Drug Use?: No Have People Annoyed You By Critizing Your Drinking Or Drug Use?: No Have You Felt Bad Or Guilty About Your Drinking Or Drug Use?: No Have You Ever Had a Drink or Used Drugs First Thing In The Morning to Steady Your Nerves or to Get Rid of a Hangover?: No CAGE-AID Score: 0  Substance Abuse Education Offered: No

## 2022-08-18 NOTE — Plan of Care (Signed)

## 2022-08-18 NOTE — Progress Notes (Signed)
[name] Admit Date: 08/17/2022 08/18/2022 [name] Requesting Physician:  Dr. Sloan Leiter  Reason for Consult:  ESRD on PD HPI:  Patient follows with Dr. Juleen China at [facility], on PD for about 3.5 years without issue. Presented to [facility] after a fall in which he hit the left side of his chest on a metal barrel. Imaging workup significant for non-displaced fractures of hte L lateral seventh and eighth ribs as well as possible fracture of the twelfth rib. Was evaluated by surgery at [facility] with some concern for possible flail chest and was thus transferred to [facility] for trauma evaluation.  Was placed on PD Overnight. Tolerating well. Is having some nausea for which he has been ordered Zofran, Phenergan, and Scopolamine patch per primary. He thinks this may be due to morphine that he received for pain control.   PMH Incudes: ESRD on PD, A Fib on Eliquis, CHF   Creatinine (mg/dL)  Date Value  07/04/2012 1.73 (H)  01/04/2012 1.34 (H)  01/03/2012 1.57 (H)   Creat (mg/dL)  Date Value  07/18/2015 1.92 (H)   Creatinine, Ser (mg/dL)  Date Value  08/17/2022 7.42 (H)  08/16/2022 7.41 (H)  06/24/2022 7.37 (H)  01/18/2022 8.45 (H)  01/17/2022 8.94 (H)  10/01/2021 8.99 (H)  09/09/2021 8.02 (H)  08/22/2021 9.35 (H)  07/29/2021 9.01 (H)  05/21/2021 8.52 (HH)  ] I/Os:  ROS IV Contrast administered 3/25 for CT Abd/Pelv with contrast  Balance of 12 systems is negative w/ exceptions as above  PMH  Past Medical History:  Diagnosis Date   Acute on chronic systolic congestive heart failure (HCC)    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 12/2018 Echo: EF 40-45%, global HK. Asc Ao 3.7cm; b. 01/2019 Lexi MV: small, mild, fixed basal and mid antlat defect - scar vs artifact. Small, mild mid and apical inf minimally reversible defect, likely scar  w/ peri-infarct ischemia. Coronary and Ao atherosclerosis; c. 12/2019 Echo: EF 40-45%, glob HK, Gr1 DD. Nl RV fxn. Sev dil LA. Mild MR. Mild-mod Ao sclerosis w/o stenosis. Asc Ao 38mm.   Carotid arterial disease ([place])    a. 01/2020 RICA 1-39%, RCCA/RECA < [id number], LICA [id number], LCCA/LECA <50%.   Claudication ([place])    a. 02/2020 ABI/TBI: R 1.08/0.95, L 1.00/0.70.   Complication of anesthesia    had to be woken up slowly as his bp was elevated when did this quickly   Diabetes mellitus without complication (HCC)    Dysplastic nevus 02/07/2013   L upper back, moderate atypia   ESRD (end stage renal disease) ([place])    a. Peritoneal Dialysis pt.   Hyperlipidemia    Hypertension    Migraine    cluster   Neuromuscular disorder ([place])    left lower extrem neuropathy   Obstructive sleep apnea    no OSA since had facial surgery with dr. Kathyrn Sheriff in 1997   PAD (peripheral artery disease) ([place]) 06/2007   nonobstructing, renal angiogram ([place])   Renal cell carcinoma 2004   left kidney heminephrectomy   Renal insufficiency    Sepsis ([place]) 11/06/2020   Skin cancer    R dorsum hand   Stroke ([place])    a. 12/2019 MRI: Small acute infarcts involving the left cerebral hemisphere and several chronic infarcts.   TIA (transient ischemic attack)    no residual but left leg  and foot still feel heavy   tobacco abuse    PSH  Past Surgical History:  Procedure Laterality Date   CAPD INSERTION N/A 03/01/2019   Procedure: LAPAROSCOPIC INSERTION CONTINUOUS AMBULATORY PERITONEAL DIALYSIS  (CAPD) CATHETER;  Surgeon: Algernon Huxley, MD;  Location: [facility] ORS;  Service: Vascular;  Laterality: N/A;   CARDIAC CATHETERIZATION     Dr. [name] did this to assess his renal artery   cyst removal  12/25/2015   Spine L4 and L5   heminephrectomy  2004   for renal cell CA   RENAL CRYOABLATION  Jan 2006   right kidney,  [facility]   RIGHT HEART CATH AND CORONARY ANGIOGRAPHY Bilateral 06/29/2022   Procedure: RIGHT HEART CATH AND CORONARY  ANGIOGRAPHY;  Surgeon: [name], MD;  Location: [facility];  Service: Cardiovascular;  Laterality: Bilateral;   sciatica     FH  Family History  Problem Relation Age of Onset   Hypertension Mother    Cancer Mother        breast   Aneurysm Mother    Coronary artery disease Father    Hypertension Father    Stroke Father 30   Heart disease Father    Heart attack Father 4   Aneurysm Maternal Grandmother        brain   Aneurysm Paternal Grandmother        brain   Coronary artery disease Paternal Grandfather    Heart disease Brother        valvular heart disease   COPD Brother    Hypertension Brother    Stroke Paternal Uncle    SH  reports that he has been smoking cigarettes. He has a 50.00 pack-year smoking history. He has never used smokeless tobacco. He reports that he does not currently use alcohol. He reports that he does not use drugs. Allergies  Allergies  Allergen Reactions   Irbesartan Other (See Comments)    hyperkalemia   Cymbalta [Duloxetine Hcl]    Hydralazine Other (See Comments)    headache   Imdur [Isosorbide Nitrate] Other (See Comments)    headache   Rosuvastatin     Breast swelling/soreness   Atorvastatin Other (See Comments)    Muscle pain   Bystolic [Nebivolol Hcl]     Extreme fatigue    Home medications Prior to Admission medications   Medication Sig Start Date End Date Taking? Authorizing Provider  diphenhydrAMINE (BENADRYL) 25 MG tablet Take 25 mg by mouth at bedtime as needed for allergies or sleep.   Yes [provider]  acetaminophen (TYLENOL) 325 MG tablet Take 2 tablets (650 mg total) by mouth every 4 (four) hours. 08/17/22   Lorella Nimrod, MD  albuterol (VENTOLIN HFA) 108 (90 Base) MCG/ACT inhaler Inhale 2 puffs into the lungs every 6 (six) hours as needed for wheezing or shortness of breath. 05/04/22   End, Harrell Gave, MD  amiodarone (PACERONE) 200 MG tablet Take 1 tablet (200 mg total) by mouth daily. 07/16/22    Gerrie Nordmann, NP  amLODipine (NORVASC) 5 MG tablet Take 1 tablet (5 mg total) by mouth daily. 06/29/22 06/29/23  End, Harrell Gave, MD  Ascorbic Acid 500 MG CHEW Chew 1 tablet by mouth daily.    [provider]  b complex vitamins capsule Take 1 capsule by mouth daily.    [provider]  bisoprolol (ZEBETA) 5 MG tablet Take 1 tablet (5 mg total) by mouth 2 (two) times daily. 01/18/22   Emeterio Reeve, DO  celecoxib (  CELEBREX) 200 MG capsule Take 1 capsule (200 mg total) by mouth 2 (two) times daily. 08/17/22   Lorella Nimrod, MD  cloNIDine (CATAPRES - DOSED IN MG/24 HR) 0.1 mg/24hr patch Place 1 patch (0.1 mg total) onto the skin every Friday. 03/27/21   Vickie Epley, MD  cloNIDine (CATAPRES) 0.2 MG tablet Take 0.2 mg by mouth at bedtime. 03/05/22   [provider]  doxazosin (CARDURA) 8 MG tablet Take 8 mg by mouth daily. 03/29/20   [provider]  dupilumab (DUPIXENT) 300 MG/2ML prefilled syringe Inject 300 mg into the skin every 14 (fourteen) days. Starting at day 15 for maintenance. 08/03/22   Ralene Bathe, MD  ELIQUIS 5 MG TABS tablet TAKE 1 TABLET BY MOUTH TWICE DAILY 05/13/22   End, Harrell Gave, MD  ferric citrate (AURYXIA) 1 GM 210 MG(Fe) tablet Take 1 tablet by mouth 3 (three) times daily. 07/13/19   [provider]  fluticasone (FLONASE) 50 MCG/ACT nasal spray Place 2 sprays into both nostrils daily as needed for allergies or rhinitis.    [provider]  gentamicin cream (GARAMYCIN) 0.1 % Apply 1 application topically daily. 11/07/20   Fritzi Mandes, MD  HYDROmorphone (DILAUDID) 1 MG/ML injection Inject 0.5 mLs (0.5 mg total) into the vein every 2 (two) hours as needed for moderate pain or severe pain. 08/17/22   Lorella Nimrod, MD  hydrOXYzine (ATARAX) 25 MG tablet Take 25 mg by mouth 3 (three) times daily. 12/29/21   [provider]  ketoconazole (NIZORAL) 2 % cream Apply 1 Application topically at bedtime. Qhs to feet for  scaly rash on feet 05/26/22   Ralene Bathe, MD  lactulose [facility]) 10 GM/15ML solution Take 15-30 mL by mouth daily as needed for constipation 06/25/22   End, Harrell Gave, MD  lidocaine (LIDODERM) 5 % Place 1 patch onto the skin daily. Remove & Discard patch within 12 hours or as directed by MD 08/18/22   Lorella Nimrod, MD  losartan (COZAAR) 50 MG tablet Take 1 tablet (50 mg total) by mouth daily. 08/18/22   Lorella Nimrod, MD  multivitamin (RENA-VIT) TABS tablet Take 1 tablet by mouth at bedtime. 12/21/19   [provider]  oxyCODONE (OXYCONTIN) 10 mg 12 hr tablet Take 1 tablet (10 mg total) by mouth every 12 (twelve) hours. 08/17/22   Lorella Nimrod, MD  sevelamer (RENAGEL) 800 MG tablet Take 800 mg by mouth 3 (three) times daily with meals.    [provider]  tacrolimus (PROTOPIC) 0.1 % ointment Apply topically 2 (two) times daily. Bid to itchy rash on body 05/26/22   Ralene Bathe, MD  torsemide [facility]) 100 MG tablet Take 100 mg by mouth daily.    [provider]  triamcinolone cream (KENALOG) 0.1 % Apply 1 Application topically as needed (itching). 04/13/22   [provider]    Current Medications Scheduled Meds:  amiodarone  200 mg Oral Daily   amLODipine  5 mg Oral Daily   bisoprolol  5 mg Oral BID   [START ON 08/20/2022] cloNIDine  0.1 mg Transdermal Q Fri   docusate sodium  100 mg Oral BID   doxazosin  8 mg Oral Daily   gentamicin cream  1 Application Topical Daily   lidocaine  1 patch Transdermal QHS   losartan  50 mg Oral Daily   nicotine  21 mg Transdermal Daily   polyethylene glycol  17 g Oral Daily   scopolamine  1 patch Transdermal Q72H   sevelamer  carbonate  800 mg Oral TID WC   torsemide  100 mg Oral Daily   Continuous Infusions:  dialysis solution 1.5% low-MG/low-CA     PRN Meds:.acetaminophen **OR** acetaminophen, morphine injection, ondansetron **OR** ondansetron (ZOFRAN) IV, oxyCODONE  CBC Recent Labs  Lab 08/16/22 2116  08/17/22 0436  WBC 12.9* 11.4*  NEUTROABS 10.2*  --   HGB 9.9* 9.1*  HCT 30.2* 28.4*  MCV 88.3 87.7  PLT 165 123456*   Basic Metabolic Panel Recent Labs  Lab 08/16/22 2116 08/17/22 0436  NA 134* 138  K 5.0 5.1  CL 101 110  CO2 24 23  GLUCOSE 127* 96  BUN 48* 48*  CREATININE 7.41* 7.42*  CALCIUM 8.0* 7.8*  PHOS  --  6.6*    Physical Exam  Blood pressure (!) 179/67, pulse (!) 52, temperature 98 F (36.7 C), temperature source Oral, resp. rate 14, weight 67.9 kg, SpO2 95 %. GEN: Chronically ill appearing, uncomfortable but NAD ENT: MMM EYES: Mild scleral icterus CV: RRR, without murmur, rub, or gallop PULM: Normal WOB on 2L, diminished on L, soft crackles over R base ABD: Distended with PD running SKIN: Age-related skin changes diffusely EXT: Without edema or deformity    Assessment & Plan  Trauma, rib fractures, concern for flail chest Thankfully per trauma team's overnight assessment, less concern for true flail chest at this time. Pain management will be key to progression toward discharge home. Thankfully imaging is reassuring with only a small effusion on the left. Eliquis has been held. - Management per primary team/trauma service  2.   ESRD on PD Received PD in hospital overnight 3/26-27, tolerating well. Did not receive PD the night of 3/25-26. Electrolytes stable. Anticipate relatively short hospital stay as long as pain due to #1 is controlled. Anticipate discharge home. - PD nightly per home regimen - Outpatient follow-up with primary nephrologist at [place]   3.  ?Hemorrhagic Cyst of L Kidney Noted on CT Abdomen/Pelvis on 3/25. No flank pain at present and Hgb/Hct is stable. Believe to be an incidental finding. - Can be followed-up as an outpatient in 2-3 weeks to assess for change in size/character. - Recommend repeat imaging should he develop flank pain or a drop in hemoglobin with re-initiation of Eliquis   4.  HTN Running high but not  concerningly so in the setting of acute pain/hospitalization.  - Management per primary team.   [name]  08/18/2022, 8:30 AM

## 2022-08-19 ENCOUNTER — Inpatient Hospital Stay (HOSPITAL_COMMUNITY): Payer: Medicare PPO

## 2022-08-19 DIAGNOSIS — D649 Anemia, unspecified: Secondary | ICD-10-CM | POA: Diagnosis not present

## 2022-08-19 DIAGNOSIS — K920 Hematemesis: Secondary | ICD-10-CM | POA: Diagnosis not present

## 2022-08-19 DIAGNOSIS — Z7901 Long term (current) use of anticoagulants: Secondary | ICD-10-CM | POA: Diagnosis not present

## 2022-08-19 DIAGNOSIS — J690 Pneumonitis due to inhalation of food and vomit: Secondary | ICD-10-CM | POA: Diagnosis not present

## 2022-08-19 DIAGNOSIS — I2510 Atherosclerotic heart disease of native coronary artery without angina pectoris: Secondary | ICD-10-CM | POA: Diagnosis not present

## 2022-08-19 DIAGNOSIS — I10 Essential (primary) hypertension: Secondary | ICD-10-CM | POA: Diagnosis not present

## 2022-08-19 DIAGNOSIS — S2242XD Multiple fractures of ribs, left side, subsequent encounter for fracture with routine healing: Secondary | ICD-10-CM | POA: Diagnosis not present

## 2022-08-19 DIAGNOSIS — N186 End stage renal disease: Secondary | ICD-10-CM | POA: Diagnosis not present

## 2022-08-19 LAB — RENAL FUNCTION PANEL
Albumin: 1.7 g/dL — ABNORMAL LOW (ref 3.5–5.0)
Anion gap: 11 (ref 5–15)
BUN: 50 mg/dL — ABNORMAL HIGH (ref 8–23)
CO2: 22 mmol/L (ref 22–32)
Calcium: 7.5 mg/dL — ABNORMAL LOW (ref 8.9–10.3)
Chloride: 103 mmol/L (ref 98–111)
Creatinine, Ser: 8.18 mg/dL — ABNORMAL HIGH (ref 0.61–1.24)
GFR, Estimated: 6 mL/min — ABNORMAL LOW (ref >60)
Glucose, Bld: 72 mg/dL (ref 70–99)
Phosphorus: 6.5 mg/dL — ABNORMAL HIGH (ref 2.5–4.6)
Potassium: 4.9 mmol/L (ref 3.5–5.1)
Sodium: 136 mmol/L (ref 135–145)

## 2022-08-19 LAB — CBC
HCT: 27.3 % — ABNORMAL LOW (ref 39.0–52.0)
Hemoglobin: 9.1 g/dL — ABNORMAL LOW (ref 13.0–17.0)
MCH: 29.3 pg (ref 26.0–34.0)
MCHC: 33.3 g/dL (ref 30.0–36.0)
MCV: 87.8 fL (ref 80.0–100.0)
Platelets: 170 10*3/uL (ref 150–400)
RBC: 3.11 MIL/uL — ABNORMAL LOW (ref 4.22–5.81)
RDW: 14.9 % (ref 11.5–15.5)
WBC: 16 10*3/uL — ABNORMAL HIGH (ref 4.0–10.5)
nRBC: 0 % (ref 0.0–0.2)

## 2022-08-19 LAB — LACTIC ACID, PLASMA: Lactic Acid, Venous: 0.8 mmol/L (ref 0.5–1.9)

## 2022-08-19 LAB — PROCALCITONIN: Procalcitonin: 0.77 ng/mL

## 2022-08-19 LAB — HEMOGLOBIN AND HEMATOCRIT, BLOOD
HCT: 26.4 % — ABNORMAL LOW (ref 39.0–52.0)
HCT: 26.6 % — ABNORMAL LOW (ref 39.0–52.0)
Hemoglobin: 8.8 g/dL — ABNORMAL LOW (ref 13.0–17.0)
Hemoglobin: 8.8 g/dL — ABNORMAL LOW (ref 13.0–17.0)

## 2022-08-19 MED ORDER — ACETAMINOPHEN 10 MG/ML IV SOLN
1000.0000 mg | Freq: Four times a day (QID) | INTRAVENOUS | Status: AC
  Administered 2022-08-19 – 2022-08-20 (×4): 1000 mg via INTRAVENOUS
  Filled 2022-08-19 (×4): qty 100

## 2022-08-19 MED ORDER — SODIUM CHLORIDE 0.9 % IV SOLN: 12.5000 mg | Freq: Four times a day (QID) | INTRAVENOUS | Status: DC | PRN

## 2022-08-19 MED ORDER — PANTOPRAZOLE INFUSION (NEW) - SIMPLE MED
8.0000 mg/h | INTRAVENOUS | Status: AC
  Administered 2022-08-19 – 2022-08-20 (×5): 8 mg/h via INTRAVENOUS
  Filled 2022-08-19 (×7): qty 100

## 2022-08-19 MED ORDER — SODIUM CHLORIDE 0.9 % IV SOLN
12.5000 mg | Freq: Once | INTRAVENOUS | Status: AC
  Administered 2022-08-19: 12.5 mg via INTRAVENOUS
  Filled 2022-08-19: qty 12.5

## 2022-08-19 MED ORDER — FUROSEMIDE 10 MG/ML IJ SOLN
60.0000 mg | Freq: Once | INTRAMUSCULAR | Status: AC
  Administered 2022-08-19: 60 mg via INTRAVENOUS
  Filled 2022-08-19: qty 6

## 2022-08-19 MED ORDER — SODIUM CHLORIDE 0.9 % IV SOLN
3.0000 g | INTRAVENOUS | Status: AC
  Administered 2022-08-19 – 2022-08-23 (×5): 3 g via INTRAVENOUS
  Filled 2022-08-19 (×5): qty 8

## 2022-08-19 MED ORDER — ACETAMINOPHEN 10 MG/ML IV SOLN
1000.0000 mg | Freq: Four times a day (QID) | INTRAVENOUS | Status: DC
  Filled 2022-08-19: qty 100

## 2022-08-19 MED ORDER — PANTOPRAZOLE SODIUM 40 MG IV SOLR
40.0000 mg | Freq: Two times a day (BID) | INTRAVENOUS | Status: DC
  Administered 2022-08-22 – 2022-08-30 (×15): 40 mg via INTRAVENOUS
  Filled 2022-08-19 (×15): qty 10

## 2022-08-19 MED ORDER — GUAIFENESIN ER 600 MG PO TB12
600.0000 mg | ORAL_TABLET | Freq: Two times a day (BID) | ORAL | Status: DC
  Administered 2022-08-19 – 2022-08-22 (×6): 600 mg via ORAL
  Filled 2022-08-19 (×6): qty 1

## 2022-08-19 MED ORDER — PANTOPRAZOLE 80MG IVPB - SIMPLE MED
80.0000 mg | Freq: Once | INTRAVENOUS | Status: AC
  Administered 2022-08-19: 80 mg via INTRAVENOUS
  Filled 2022-08-19: qty 100

## 2022-08-19 MED ORDER — FUROSEMIDE 10 MG/ML IJ SOLN: 160.0000 mg | Freq: Once | INTRAVENOUS | Status: DC

## 2022-08-19 NOTE — Progress Notes (Signed)
Occupational Therapy Treatment Patient Details Name: [name] MRN: [id number] DOB: Aug 22, 1945 Today's Date: 08/19/2022   History of present illness Pt is a 77 y/o M admitted to [facility] from [facility] on 3/25 after a fall resulting in nondisplaced L 7th and 8th rib fractures, seen on chest CT. Imaging also revealed free air in abdomen. PMHx: CHF, A-fib, ESRD on peritoneal dialysis, HTN, HLD, CVA   OT comments  Patient seen following PT visit. Patient appeared drowsy but agreeable to OT session. Grooming performed seated and toilet transfers simulated with recliner and RW due to patient appeared unsteady. Patient became more lethargic during visit and patient was assisted back to supine. Patient to continue to be followed by acute OT to address functional transfers, bathing, dressing, and grooming standing at sink.    Recommendations for follow up therapy are one component of a multi-disciplinary discharge planning process, led by the attending physician.  Recommendations may be updated based on patient status, additional functional criteria and insurance authorization.    Assistance Recommended at Discharge Frequent or constant Supervision/Assistance  Patient can return home with the following  A little help with walking and/or transfers;A lot of help with bathing/dressing/bathroom;Assistance with cooking/housework;Help with stairs or ramp for entrance;Assist for transportation   Equipment Recommendations  None recommended by OT    Recommendations for Other Services      Precautions / Restrictions Precautions Precautions: Fall Precaution Comments: L 7/8 rib fracture and maybe 12th, peritoneal dialysis; monitor O2 Restrictions Weight Bearing Restrictions: No       Mobility Bed Mobility Overal bed mobility: Needs Assistance Bed Mobility: Rolling, Sit to Sidelying Rolling: Supervision       Sit to sidelying: Min guard General bed mobility comments: increase time and rib  pain during bed mobiltiy    Transfers Overall transfer level: Needs assistance Equipment used: Rolling walker (2 wheels) Transfers: Sit to/from Stand, Bed to chair/wheelchair/BSC Sit to Stand: Min assist Stand pivot transfers: Min assist         General transfer comment: due to increased drowsiness patient required min assist to return to bed and for simulated toilet transfers     Balance Overall balance assessment: Needs assistance Sitting-balance support: No upper extremity supported, Feet supported Sitting balance-Leahy Scale: Fair Sitting balance - Comments: able to maintain balance on EOB before returning to sidelying   Standing balance support: Single extremity supported, Bilateral upper extremity supported Standing balance-Leahy Scale: Poor Standing balance comment: RW used for standing balance due to level of arousal                           ADL either performed or assessed with clinical judgement   ADL Overall ADL's : Needs assistance/impaired     Grooming: Wash/dry hands;Wash/dry face;Oral care;Set up;Sitting                   Toilet Transfer: Minimal assistance Toilet Transfer Details (indicate cue type and reason): simulated from recliner with patient requiring min assist with turning                Extremity/Trunk Assessment              Vision       Perception     Praxis      Cognition Arousal/Alertness: Lethargic Behavior During Therapy: Flat affect Overall Cognitive Status: Within Functional Limits for tasks assessed  General Comments: Patient seen following PT session. Patient initially alert but drowsy and became more lethargic during visit.        Exercises      Shoulder Instructions       General Comments BP in sitting 123/57, after standing 106/73    Pertinent Vitals/ Pain       Pain Assessment Pain Assessment: Faces Faces Pain Scale: Hurts even  more Pain Location: ribs Pain Descriptors / Indicators: Aching, Grimacing, Guarding Pain Intervention(s): Monitored during session, Premedicated before session, Repositioned  Home Living                                          Prior Functioning/Environment              Frequency  Min 2X/week        Progress Toward Goals  OT Goals(current goals can now be found in the care plan section)  Progress towards OT goals: Progressing toward goals  Acute Rehab OT Goals Patient Stated Goal: get better OT Goal Formulation: With patient Time For Goal Achievement: 09/01/22 Potential to Achieve Goals: Good ADL Goals Pt Will Perform Grooming: with supervision;standing Pt Will Perform Lower Body Dressing: with min assist;sit to/from stand Pt Will Transfer to Toilet: with min guard assist;ambulating;regular height toilet;grab bars Pt Will Perform Toileting - Clothing Manipulation and hygiene: with min guard assist;sit to/from stand Additional ADL Goal #1: pt will be S in and OOB for basic ADLs  Plan Discharge plan remains appropriate    Co-evaluation                 AM-PAC OT "6 Clicks" Daily Activity     Outcome Measure   Help from another person eating meals?: None Help from another person taking care of personal grooming?: A Little Help from another person toileting, which includes using toliet, bedpan, or urinal?: A Lot Help from another person bathing (including washing, rinsing, drying)?: A Lot Help from another person to put on and taking off regular upper body clothing?: A Lot Help from another person to put on and taking off regular lower body clothing?: A Lot 6 Click Score: 15    End of Session Equipment Utilized During Treatment: Rolling walker (2 wheels);Oxygen (2 liters)  OT Visit Diagnosis: Unsteadiness on feet (R26.81);Other abnormalities of gait and mobility (R26.89);Pain Pain - Right/Left: Left Pain - part of body:  (ribs)    Activity Tolerance Patient limited by lethargy   Patient Left in bed;with call bell/phone within reach;with bed alarm set;with nursing/sitter in room   Nurse Communication Mobility status (lethargic)        Time: [id number] OT Time Calculation (min): 29 min  Charges: OT General Charges $OT Visit: 1 Visit OT Treatments $Self Care/Home Management : 8-22 mins $Therapeutic Activity: 8-22 mins  Lodema Hong, OTA Acute Rehabilitation Services  Office [phone]   Trixie Dredge 08/19/2022, 1:08 PM

## 2022-08-19 NOTE — Progress Notes (Signed)
PROGRESS NOTE        PATIENT DETAILS Name: [name] Age: 77 y.o. Sex: male Date of Birth: 10-31-1945 Admit Date: 08/17/2022 Admitting Physician Emilee Hero, MD [id number], Aris Everts, MD  Brief Summary: Patient is a 77 y.o.  male with history of ESRD on peritoneal dialysis, A-fib on Eliquis, HTN, HLD-who was transferred from [facility] for concern for a flail chest following a fall with rib fracture.  Significant events: 3/25-3/26>> hospitalization at [facility] following a fall-concern for flail chest-transferred to [facility] at [facility] for trauma evaluation. 3/27>> numerous episodes of vomiting evening-into night-vomitus became coffee-ground-some intermittent hypoxia.  Kept n.p.o.-PPI infusion started.  Significant studies: 3/25>> CT chest: Nondisplaced left seventh/eighth rib fracture, possible 12th left rib fracture. 3/25>> CT abdomen/pelvis: Extensive pneumoperitoneum-in the setting of PD catheter.  6.4 cm left cystic lesion-suggestive of a hemorrhagic cyst.  Significant microbiology data: None  Procedures: None  Consults: Nephrology Trauma surgery  Subjective: Appears sleepy but easily arousable-answer simple questions appropriately.  On 2 L of oxygen via nasal cannula.  Some coffee-ground appearing vomitus in the container at bedside.  Per nursing staff-small episode of vomiting this morning again-dark appearing.  Objective: Vitals: Blood pressure (!) 164/61, pulse 66, temperature 97.7 F (36.5 C), temperature source Oral, resp. rate 18, weight 66.3 kg, SpO2 97 %.   Exam: Gen Exam:Alert awake-not in any distress HEENT:atraumatic, normocephalic Chest: Transmitted upper airway sounds-but otherwise moving air well. CVS:S1S2 regular Abdomen:soft non tender, non distended Extremities:no edema Neurology: Non focal Skin: no rash  Pertinent Labs/Radiology:    Latest Ref Rng & Units 08/19/2022    9:29 AM 08/18/2022    9:40 PM 08/17/2022    4:36 AM  CBC   WBC 4.0 - 10.5 K/uL 16.0  19.1  11.4   Hemoglobin 13.0 - 17.0 g/dL 9.1  9.8  9.1   Hematocrit 39.0 - 52.0 % 27.3  29.0  28.4   Platelets 150 - 400 K/uL 170  165  148     Lab Results  Component Value Date   NA 136 08/19/2022   K 4.9 08/19/2022   CL 103 08/19/2022   CO2 22 08/19/2022      Assessment/Plan: Mechanical fall with left seventh, eighth and possible 12th rib fracture Continue incentive spirometry Hold narcotics due to persistent vomiting Attempt pain control with IV Tylenol. Pulmonary tolerating-mobilization with PT/OT.  Intractable nausea/vomiting Upper GI bleed with coffee-ground emesis-?  Mallory-Weiss tear ?  Vomiting due to narcotics-or excess PD fill volume Hold IV Dilaudid Spoke with nephrology regarding adjustment of PD fill volume Await CBC-continue PPI-GI consulted for possible endoscopic evaluation at some point. Keep n.p.o. for now-given intractable vomiting overnight  Acute hypoxic respiratory failure Initially felt to be due to rib fracture/splinting/atelectasis-suspect may have aspirated overnight on 3/27 leading to more worsening of hypoxemia Continue to mobilize Pulmonary toileting Now on IV Unasyn  Aspiration pneumonia Significant elevation in white count today-RLL infiltrate seen on CXR 3/28 Obtain cultures-starting IV Unasyn Keep n.p.o. till vomiting is better.  Left renal hemorrhagic cyst Incidental finding on CT abdomen Eliquis remains on hold  ESRD on peritoneal dialysis Nephrology following  PAF Sinus rhythm on telemetry Continue amiodarone Eliquis on hold-due to possible hemorrhagic cyst-and now due to possible upper GI bleeding with Mallory-Weiss tear  Chronic HFrEF Volume status stable Volume overload PD Also on high-dose Demadex.  CAD (LHC  06/30/2022-two-vessel disease-70-80% mid LAD stenosis, chronic total occlusion of RCA with left-right collaterals-optimization of medical therapy recommended) Not on antiplatelets as he  is on Eliquis Intolerant to statin Currently without any anginal symptoms-his left-sided musculoskeletal chest pain is from rib fractures.  HTN BP on the higher side-continue amlodipine, bisoprolol, clonidine, doxazosin, losartan Will follow and optimize accordingly over the next several days  BMI: Estimated body mass index is 23.59 kg/m as calculated from the following:   Height as of an earlier encounter on 08/17/22: 5\' 6"  (1.676 m).   Weight as of this encounter: 66.3 kg.   Code status:   Code Status: Full Code   DVT Prophylaxis: Place TED hose Start: 08/17/22 2148   Family Communication: Francico Palk [id number] VM 3/28   Disposition Plan: Status is: Inpatient Remains inpatient appropriate because: Severity of illness   Planned Discharge Destination:Home health   Diet: Diet Order             Diet NPO time specified Except for: Sips with Meds  Diet effective now                     Antimicrobial agents: Anti-infectives (From admission, onward)    Start     Dose/Rate Route Frequency Ordered Stop   08/19/22 1100  Ampicillin-Sulbactam (UNASYN) 3 g in sodium chloride 0.9 % 100 mL IVPB        3 g 200 mL/hr over 30 Minutes Intravenous Every 24 hours 08/19/22 0957          MEDICATIONS: Scheduled Meds:  amiodarone  200 mg Oral Daily   amLODipine  5 mg Oral Daily   bisoprolol  5 mg Oral BID   [START ON 08/20/2022] cloNIDine  0.1 mg Transdermal Q Fri   docusate sodium  100 mg Oral BID   doxazosin  8 mg Oral Daily   gentamicin cream  1 Application Topical Daily   guaiFENesin  600 mg Oral BID   lidocaine  1 patch Transdermal QHS   losartan  50 mg Oral Daily   nicotine  21 mg Transdermal Daily   [START ON 08/22/2022] pantoprazole  40 mg Intravenous Q12H   polyethylene glycol  17 g Oral BID   scopolamine  1 patch Transdermal Q72H   sevelamer carbonate  800 mg Oral TID WC   Continuous Infusions:  ampicillin-sulbactam (UNASYN) IV 3 g (08/19/22  1010)   dialysis solution 1.5% low-MG/low-CA     dialysis solution 2.5% low-MG/low-CA     pantoprazole 8 mg/hr (08/19/22 0435)   promethazine (PHENERGAN) injection (IM or IVPB) 12.5 mg (08/18/22 1809)   promethazine (PHENERGAN) injection (IM or IVPB)     PRN Meds:.HYDROmorphone (DILAUDID) injection, lactulose, metoCLOPramide (REGLAN) injection, naLOXone (NARCAN)  injection, ondansetron **OR** ondansetron (ZOFRAN) IV, oxyCODONE, promethazine **OR** promethazine (PHENERGAN) injection (IM or IVPB) **OR** promethazine, promethazine (PHENERGAN) injection (IM or IVPB)   I have personally reviewed following labs and imaging studies  LABORATORY DATA: CBC: Recent Labs  Lab 08/16/22 2116 08/17/22 0436 08/18/22 2140 08/19/22 0929  WBC 12.9* 11.4* 19.1* 16.0*  NEUTROABS 10.2*  --  17.3*  --   HGB 9.9* 9.1* 9.8* 9.1*  HCT 30.2* 28.4* 29.0* 27.3*  MCV 88.3 87.7 86.6 87.8  PLT 165 148* 165 170     Basic Metabolic Panel: Recent Labs  Lab 08/16/22 2116 08/17/22 0436 08/18/22 2140 08/19/22 0929  NA 134* 138 137 136  K 5.0 5.1 4.7 4.9  CL 101 110 102 103  CO2 24  23 24 22   GLUCOSE 127* 96 203* 72  BUN 48* 48* 49* 50*  CREATININE 7.41* 7.42* 8.09* 8.18*  CALCIUM 8.0* 7.8* 8.1* 7.5*  PHOS  --  6.6*  --  6.5*     GFR: Estimated Creatinine Clearance: 6.9 mL/min (A) (by C-G formula based on SCr of 8.18 mg/dL (H)).  Liver Function Tests: Recent Labs  Lab 08/16/22 2116 08/18/22 2140 08/19/22 0929  AST 12* 13*  --   ALT 12 13  --   ALKPHOS 107 122  --   BILITOT 0.5 0.6  --   PROT 5.2* 4.9*  --   ALBUMIN 2.1* 1.9* 1.7*    No results for input(s): "LIPASE", "AMYLASE" in the last 168 hours. No results for input(s): "AMMONIA" in the last 168 hours.  Coagulation Profile: Recent Labs  Lab 08/17/22 0436  INR 1.2     Cardiac Enzymes: No results for input(s): "CKTOTAL", "CKMB", "CKMBINDEX", "TROPONINI" in the last 168 hours.  BNP (last 3 results) No results for input(s):  "PROBNP" in the last 8760 hours.  Lipid Profile: No results for input(s): "CHOL", "HDL", "LDLCALC", "TRIG", "CHOLHDL", "LDLDIRECT" in the last 72 hours.  Thyroid Function Tests: No results for input(s): "TSH", "T4TOTAL", "FREET4", "T3FREE", "THYROIDAB" in the last 72 hours.  Anemia Panel: No results for input(s): "VITAMINB12", "FOLATE", "FERRITIN", "TIBC", "IRON", "RETICCTPCT" in the last 72 hours.  Urine analysis:    Component Value Date/Time   COLORURINE STRAW (A) 11/28/2020 1055   APPEARANCEUR CLEAR (A) 11/28/2020 1055   APPEARANCEUR Clear 01/03/2012 1117   LABSPEC 1.008 11/28/2020 1055   LABSPEC 1.004 01/03/2012 1117   PHURINE 7.0 11/28/2020 1055   GLUCOSEU 50 (A) 11/28/2020 1055   GLUCOSEU 100 (A) 01/21/2020 1223   HGBUR NEGATIVE 11/28/2020 1055   BILIRUBINUR NEGATIVE 11/28/2020 1055   BILIRUBINUR neg 07/30/2013 1127   BILIRUBINUR Negative 01/03/2012 1117   KETONESUR NEGATIVE 11/28/2020 1055   PROTEINUR 30 (A) 11/28/2020 1055   UROBILINOGEN 0.2 01/21/2020 1223   NITRITE NEGATIVE 11/28/2020 1055   LEUKOCYTESUR NEGATIVE 11/28/2020 1055   LEUKOCYTESUR Negative 01/03/2012 1117    Sepsis Labs: Lactic Acid, Venous    Component Value Date/Time   LATICACIDVEN 0.8 08/19/2022 0929    MICROBIOLOGY: No results found for this or any previous visit (from the past 240 hour(s)).  RADIOLOGY STUDIES/RESULTS: DG Chest Port 1V same Day  Result Date: 08/19/2022 CLINICAL DATA:  Shortness of breath. EXAM: PORTABLE CHEST 1 VIEW COMPARISON:  08/19/2022 FINDINGS: The cardio pericardial silhouette is enlarged. Diffuse interstitial opacity is similar to prior. Stable volume loss right hemithorax with right base collapse/consolidation and small to moderate right pleural effusion. Small left pleural effusion again noted. Bones are diffusely demineralized. Telemetry leads overlie the chest. IMPRESSION: No substantial interval change in exam. Right base collapse/consolidation with small to  moderate right pleural effusion and small left effusion. Electronically Signed   By: Misty Stanley M.D.   On: 08/19/2022 09:48   DG Abd 2 Views  Result Date: 08/19/2022 CLINICAL DATA:  Vomiting EXAM: ABDOMEN - 2 VIEW COMPARISON:  08/16/2022 FINDINGS: Scattered large and small bowel gas is noted. No obstructive changes are seen. No free air is noted no acute bony abnormality is seen. IMPRESSION: No acute abnormality noted. Electronically Signed   By: Inez Catalina M.D.   On: 08/19/2022 02:33   DG CHEST PORT 1 VIEW  Result Date: 08/17/2022 CLINICAL DATA:  Known left rib fractures and chest pain, initial encounter EXAM: PORTABLE CHEST 1 VIEW COMPARISON:  08/16/2022 FINDINGS: Cardiac shadow is enlarged but stable. Aortic calcifications are seen. Vascular congestion is noted centrally. Mild left pleural effusion is noted. Known left rib fractures are not well appreciated on this exam. No acute bony abnormality is noted. IMPRESSION: New small left effusion. Mild central vascular congestion. Electronically Signed   By: Inez Catalina M.D.   On: 08/17/2022 22:25     LOS: 2 days   Oren Binet, MD  [name]    To contact the attending provider between 7A-7P or the covering provider during after hours 7P-7A, please log into the web site [email or web address] and access using universal [place] password for that web site. If you do not have the password, please call the hospital operator.  08/19/2022, 11:16 AM

## 2022-08-19 NOTE — Progress Notes (Signed)
Physical Therapy Treatment Patient Details Name: [name] MRN: [id number] DOB: 07-23-1945 Today's Date: 08/19/2022   History of Present Illness Pt is a 77 y/o M admitted to [facility] from [facility] on 3/25 after a fall resulting in nondisplaced L 7th and 8th rib fractures, seen on chest CT. Imaging also revealed free air in abdomen. PMHx: CHF, A-fib, ESRD on peritoneal dialysis, HTN, HLD, CVA    PT Comments    Pt greeted supine in bed, sleeping but easily woken, and agreeable to session. Pt continues to be limited by fatigue and pain with pt needing increased assist up to mod A to come to sitting EOB and significantly increased time and assist to find and maintain midline as pt with posterior and R lateral bias. Pt able to power up to stand with min A to steady and requiring min A throughout gait with IV pole support. VSS on 2L O2 throughout activity. Current plan remains appropriate to address deficits and maximize functional independence and safety. Pt continues to benefit from skilled PT services to progress toward functional mobility goals.     Recommendations for follow up therapy are one component of a multi-disciplinary discharge planning process, led by the attending physician.  Recommendations may be updated based on patient status, additional functional criteria and insurance authorization.  Follow Up Recommendations       Assistance Recommended at Discharge Frequent or constant Supervision/Assistance  Patient can return home with the following A little help with walking and/or transfers;Assist for transportation;Help with stairs or ramp for entrance   Equipment Recommendations  Other (comment) (will continue to assess)    Recommendations for Other Services       Precautions / Restrictions Precautions Precautions: Fall Precaution Comments: L 7/8 rib fracture and maybe 12th, peritoneal dialysis; monitor O2 Restrictions Weight Bearing Restrictions: No     Mobility   Bed Mobility Overal bed mobility: Needs Assistance Bed Mobility: Rolling, Sidelying to Sit, Sit to Sidelying Rolling: Supervision Sidelying to sit: HOB elevated, Mod assist (A for trunk)       General bed mobility comments: increased time for all movements due to pain, mod A to raise trunk, significantly increased time for pt to find midline due to posteior and R bias    Transfers Overall transfer level: Needs assistance Equipment used: 1 person hand held assist Transfers: Sit to/from Stand Sit to Stand: Min assist           General transfer comment: min A to steady on rise, pt reaching for sink    Ambulation/Gait Ambulation/Gait assistance: Min guard, Min assist Gait Distance (Feet): 100 Feet Assistive device: IV Pole Gait Pattern/deviations: Step-through pattern, Decreased stride length, Drifts right/left, Narrow base of support Gait velocity: decreased     General Gait Details: pt placing 2 hands on IV pole during gait, no LOB with support of IVpole but noted instabitlity with low foot clearance and lateral postural sway, pt declining use of RW   Stairs             Wheelchair Mobility    Modified Rankin (Stroke Patients Only)       Balance Overall balance assessment: Needs assistance Sitting-balance support: No upper extremity supported, Feet supported Sitting balance-Leahy Scale: Poor Sitting balance - Comments: significantly increased time to find balance at EOB, tipping posterior with feet floating off foor and strong R bias Postural control: Posterior lean, Right lateral lean Standing balance support: Single extremity supported Standing balance-Leahy Scale: Poor Standing balance comment: 1  mild LOB and mild imbalance with ambulation without AD                            Cognition Arousal/Alertness: Awake/alert, Lethargic (intermittent drowsiness) Behavior During Therapy: Flat affect Overall Cognitive Status: Within Functional Limits  for tasks assessed                                 General Comments: Pt A&Ox4, slow to respond at some points but suspect due to drowsiness        Exercises      General Comments General comments (skin integrity, edema, etc.): VSS on 2LO2      Pertinent Vitals/Pain Pain Assessment Pain Assessment: Faces Faces Pain Scale: Hurts even more Pain Location: ribs Pain Descriptors / Indicators: Aching, Grimacing, Guarding Pain Intervention(s): Monitored during session, Limited activity within patient's tolerance, Repositioned    Home Living                          Prior Function            PT Goals (current goals can now be found in the care plan section) Acute Rehab PT Goals Patient Stated Goal: none stated PT Goal Formulation: With patient Time For Goal Achievement: 09/01/22 Progress towards PT goals: Progressing toward goals    Frequency    Min 3X/week      PT Plan      Co-evaluation              AM-PAC PT "6 Clicks" Mobility   Outcome Measure  Help needed turning from your back to your side while in a flat bed without using bedrails?: A Little Help needed moving from lying on your back to sitting on the side of a flat bed without using bedrails?: A Little Help needed moving to and from a bed to a chair (including a wheelchair)?: A Little Help needed standing up from a chair using your arms (e.g., wheelchair or bedside chair)?: A Little Help needed to walk in hospital room?: A Little Help needed climbing 3-5 steps with a railing? : A Lot 6 Click Score: 17    End of Session Equipment Utilized During Treatment: Gait belt;Oxygen Activity Tolerance: Patient limited by fatigue Patient left: in chair;Other (comment) (handoff to OT) Nurse Communication: Mobility status PT Visit Diagnosis: Unsteadiness on feet (R26.81);History of falling (Z91.81);Difficulty in walking, not elsewhere classified (R26.2)     Time: [id number] PT Time  Calculation (min) (ACUTE ONLY): 24 min  Charges:  $Gait Training: 8-22 mins $Therapeutic Activity: 8-22 mins                     Kathalene Sporer R. PTA Acute Rehabilitation Services Office: [place] 08/19/2022, 12:11 PM

## 2022-08-19 NOTE — Progress Notes (Signed)
Pharmacy Antibiotic Note  [name] is a 77 y.o. male to begin Unasyn for aspiration pneumonia coverage.  CCPD.   Plan: Unasyn 3 gm IV q24h. Will follow culture data, clinical progress and antibiotic plans.  Weight: 66.3 kg (146 lb 2.6 oz)  Temp (24hrs), Avg:97.6 F (36.4 C), Min:97.2 F (36.2 C), Max:97.9 F (36.6 C)  Recent Labs  Lab 08/16/22 2116 08/17/22 0059 08/17/22 0436 08/18/22 2140 08/19/22 0929  WBC 12.9*  --  11.4* 19.1* 16.0*  CREATININE 7.41*  --  7.42* 8.09* 8.18*  LATICACIDVEN 0.5 0.6  --   --  0.8    ESRD - on CCPD  Allergies  Allergen Reactions   Irbesartan Other (See Comments)    hyperkalemia   Cymbalta [Duloxetine Hcl]    Hydralazine Other (See Comments)    headache   Imdur [Isosorbide Nitrate] Other (See Comments)    headache   Rosuvastatin     Breast swelling/soreness   Atorvastatin Other (See Comments)    Muscle pain   Bystolic [Nebivolol Hcl]     Extreme fatigue     Antimicrobials this admission: Unasyn 3/28>> Gentamicin cream to PD exit site daily 3/27>>  Dose adjustments this admission: n/a  Microbiology results: 3/28 blood: sent  Thank you for allowing pharmacy to be a part of this patient's care.  Arty Baumgartner, [place] 08/19/2022 3:46 PM

## 2022-08-19 NOTE — Significant Event (Signed)
Rapid Response Event Note   Reason for Call :  Pt with acute events overnight including acute oxygen desaturation, emesis with concern for aspiration, and crackle lung sounds during PD.   Called for second set of eyes today.   Initial Focused Assessment:  Pt lying in bed. Awakens to noxious stimuli. He answers his name correctly, otherwise he tells [place] it is [place].  Lung sounds are clear. No distress. Skin is pink, warm, dry. Abdomen is soft. No adventitious heart sounds.   VS: T 97.23F, BP 164/61, HR 62, RR 15, SpO2 98% on 2LNC  Interventions:  No intervention from RR RN  Plan of Care:  -MD at bedside to evaluate patient and appropriate interventions are in place. Pt maintaining stable VS.  Call rapid response for additional needs  Event Summary:  MD Notified: Dr. Sloan Leiter Call Time: 0940 Arrival Time: 0945 End Time: [place], RN

## 2022-08-19 NOTE — Progress Notes (Signed)
Admit: 08/17/2022 LOS: 2  77 yo male with ESRD on PD who is admitted for rib fractures after a fall. Nephrology consulted to manage PD while hospitalized and for assessment of ?hemorrhagic renal cyst on CT scan.  Subjective:  He reports that the PD instillation volume here feels higher than the volumes he uses at home. Is very uncomfortable during dwells, with intraabdominal volumes affecting his ability to get a good breath with is already impaired breathing 2/2 rib fractures.  Concern for possible breathing overnight with diminished wakefulness this morning and multiple vomiting events overnight.  Note increase in WBC to 19k.    03/27 0701 - 03/28 0700 In: -[place] [IV Piggyback:91] Out: 500 [Urine:500]  Filed Weights   08/18/22 0054 08/19/22 0500  Weight: 67.9 kg 66.3 kg    Scheduled Meds:  acetaminophen  1,000 mg Oral Q6H   amiodarone  200 mg Oral Daily   amLODipine  5 mg Oral Daily   bisoprolol  5 mg Oral BID   [START ON 08/20/2022] cloNIDine  0.1 mg Transdermal Q Fri   docusate sodium  100 mg Oral BID   doxazosin  8 mg Oral Daily   gentamicin cream  1 Application Topical Daily   guaiFENesin  600 mg Oral BID   lidocaine  1 patch Transdermal QHS   losartan  50 mg Oral Daily   nicotine  21 mg Transdermal Daily   [START ON 08/22/2022] pantoprazole  40 mg Intravenous Q12H   polyethylene glycol  17 g Oral BID   scopolamine  1 patch Transdermal Q72H   sevelamer carbonate  800 mg Oral TID WC   Continuous Infusions:  dialysis solution 1.5% low-MG/low-CA     dialysis solution 2.5% low-MG/low-CA     pantoprazole 8 mg/hr (08/19/22 0435)   promethazine (PHENERGAN) injection (IM or IVPB) 12.5 mg (08/18/22 1809)   promethazine (PHENERGAN) injection (IM or IVPB)     PRN Meds:.HYDROmorphone (DILAUDID) injection, lactulose, metoCLOPramide (REGLAN) injection, naLOXone (NARCAN)  injection, ondansetron **OR** ondansetron (ZOFRAN) IV, oxyCODONE, promethazine **OR** promethazine (PHENERGAN)  injection (IM or IVPB) **OR** promethazine, promethazine (PHENERGAN) injection (IM or IVPB)  Current Labs: reviewed    Physical Exam:  Blood pressure (!) 169/53, pulse 63, temperature 97.7 F (36.5 C), temperature source Oral, resp. rate 17, weight 66.3 kg, SpO2 94 %. Gen: Sleeping on exam, quite drowsy and difficult to awaken. Acutely on chronically ill appearing Cardio: RRR, no murmur or rub Pulm: Shallow breathing, coarse breath sounds with diffuse rhonchi, diminished in L base Abdomen: Mildly distended but apparently non-tender, exam limited by drowsiness   A&P 1.   ESRD on PD Issues with discomfort, vomiting, and shortness of breath with peritoneal dwells last night. Likely an issue with increased thoracic pressure in the setting of rib fractures. New concern for aspiration this am.  - PD nightly- will reduce volume from 2L>1L  - Primary team has ordered procalcitonin, lactate, and blood cx - Outpatient follow-up with primary nephrologist at [facility]   2.  Trauma, rib fractures, concern for flail chest  Now with concern for aspiration pnemonitis Issues with vomiting overnight, high risk for aspiration due to #'s 1 and 2. Abdominal XR last night seems to demonstrate developing consolidation in RLL. Briefly on 10L NRB overnight, now back to 2L [place]. Did get a 1 time dose of Lasix 60mg  IV overnight due to concern for volume overload.  - Primary team has ordered am CXR - Management per primary team/trauma service   3.  ?  Hemorrhagic Cyst of L Kidney Noted on CT Abdomen/Pelvis on 3/25. No flank pain at present and Hgb/Hct is stable. Believe to be an incidental finding. - Can be followed-up as an outpatient in 2-3 weeks to assess for change in size/character. - Recommend repeat imaging should he develop flank pain or a drop in hemoglobin with re-initiation of Eliquis    4.  HTN Running high but not concerningly so in the setting of acute pain/hospitalization.  - Management  per primary team.    Marnee Guarneri MD 08/19/2022, 8:26 AM  Recent Labs  Lab 08/16/22 2116 08/17/22 0436 08/18/22 2140  NA 134* 138 137  K 5.0 5.1 4.7  CL 101 110 102  CO2 24 23 24   GLUCOSE 127* 96 203*  BUN 48* 48* 49*  CREATININE 7.41* 7.42* 8.09*  CALCIUM 8.0* 7.8* 8.1*  PHOS  --  6.6*  --    Recent Labs  Lab 08/16/22 2116 08/17/22 0436 08/18/22 2140  WBC 12.9* 11.4* 19.1*  NEUTROABS 10.2*  --  17.3*  HGB 9.9* 9.1* 9.8*  HCT 30.2* 28.4* 29.0*  MCV 88.3 87.7 86.6  PLT 165 148* 165

## 2022-08-19 NOTE — Progress Notes (Signed)
Rapid Response RN called in regards to patient condition. Pt condition does not appear to be improving, rapid nurse Shelly called for a second assessment.

## 2022-08-19 NOTE — Consult Note (Addendum)
Consultation  Referring Provider: TRH/ Ghimire Primary Care Physician:  Crecencio Mc, MD Primary Gastroenterologist:  none  Reason for Consultation:   Multiple episodes of nausea and vomiting with coffee-ground emesis  HPI: [name] is a 77 y.o. male, who was admitted here on 08/17/2022 in transfer from [facility] for trauma involvement after he had sustained a fall and suffered several rib fractures.  There was concern for possible flail chest.  Imaging has shown a displaced left seventh and eighth rib fracture and possible left 12th rib fracture. Patient has multiple comorbidities including end-stage renal disease on [place] dialysis, history of atrial fibs flutter on chronic Eliquis, history of prior CVA/TIA, hypertension, cardiomyopathy with EF of 30 to 35%, diabetes mellitus, peripheral arterial disease and history of renal cell CA status post left nephrectomy. Undergone CT of the abdomen pelvis on 08/16/2022 that showed bilateral lower lobe atelectasis, multiple fluid density lesions in the kidneys consistent with cysts and 1 hemorrhagic cyst measuring 6.4 cm, stomach and bowel appeared normal, there is extensive severe atherosclerotic disease and noted a moderate to large volume of free intraperitoneal air more than expected with peritoneal dialysis.  2 view abdomen today no free air and no evidence of obstruction CXR x-ray today right basilar consolidation/collapse and moderate right effusion  Last evening patient developed nausea and vomiting, apparently had multiple episodes of vomiting eventually becoming coffee-ground material.  Per his nurse today he has not had any further hematemesis today and no bowel movements thus far  Eliquis has been on hold since 08/16/2022. This morning he has been lethargic and confused but hemodynamically stable. He is currently on 2 L of nasal O2 satting at 99.  Blood pressure 142/50/pulse in the 60s  IV Unasyn has been started with  concern for aspiration pneumonia  Review of labs from 08/16/2022 shows WBC of 12.9/hemoglobin 9.9/hematocrit 30.2 and lactate of 0.5 On 08/17/2022 WBC 11.4 hemoglobin 9.1/INR 1.2 BUN 48/creatinine 7.42  Last p.m. hemoglobin 9.8 and WBC 19.1 Today WBC 16,000 and hemoglobin 9.1 Blood cultures are pending BUN 50/creatinine 8.18.  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 12/2018 Echo: EF 40-45%, global HK. Asc Ao 3.7cm; b. 01/2019 Lexi MV: small, mild, fixed basal and mid antlat defect - scar vs artifact. Small, mild mid and apical inf minimally reversible defect, likely scar w/ peri-infarct ischemia. Coronary and Ao atherosclerosis; c. 12/2019 Echo: EF 40-45%, glob HK, Gr1 DD. Nl RV fxn. Sev dil LA. Mild MR. Mild-mod Ao sclerosis w/o stenosis. Asc Ao 4mm.   Carotid arterial disease ([place])    a. 01/2020 RICA 1-39%, RCCA/RECA < [id number], LICA [id number], LCCA/LECA <50%.   Claudication ([place])    a. 02/2020 ABI/TBI: R 1.08/0.95, L 1.00/0.70.   Complication of anesthesia    had to be woken up slowly as his bp was elevated when did this quickly   Diabetes mellitus without complication (HCC)    Dysplastic nevus 02/07/2013   L upper back, moderate atypia   ESRD (end stage renal disease) ([place])    a. Peritoneal Dialysis pt.   Hyperlipidemia    Hypertension    Migraine  cluster   Neuromuscular disorder ([place])    left lower extrem neuropathy   Obstructive sleep apnea    no OSA since had facial surgery with dr. Kathyrn Sheriff in 1997   PAD (peripheral artery disease) ([place]) 06/2007   nonobstructing, renal angiogram ([place])   Renal cell carcinoma 2004    left kidney heminephrectomy   Renal insufficiency    Sepsis ([place]) 11/06/2020   Skin cancer    R dorsum hand   Stroke ([place])    a. 12/2019 MRI: Small acute infarcts involving the left cerebral hemisphere and several chronic infarcts.   TIA (transient ischemic attack)    no residual but left leg and foot still feel heavy   tobacco abuse     Past Surgical History:  Procedure Laterality Date   CAPD INSERTION N/A 03/01/2019   Procedure: LAPAROSCOPIC INSERTION CONTINUOUS AMBULATORY PERITONEAL DIALYSIS  (CAPD) CATHETER;  Surgeon: Algernon Huxley, MD;  Location: [facility] ORS;  Service: Vascular;  Laterality: N/A;   CARDIAC CATHETERIZATION     Dr. [name] did this to assess his renal artery   cyst removal  12/25/2015   Spine L4 and L5   heminephrectomy  2004   for renal cell CA   RENAL CRYOABLATION  Jan 2006   right kidney,  [facility]   RIGHT HEART CATH AND CORONARY ANGIOGRAPHY Bilateral 06/29/2022   Procedure: RIGHT HEART CATH AND CORONARY ANGIOGRAPHY;  Surgeon: [name], MD;  Location: [place] [facility];  Service: Cardiovascular;  Laterality: Bilateral;   sciatica      Prior to Admission medications   Medication Sig Start Date End Date Taking? Authorizing Provider  acetaminophen (TYLENOL) 325 MG tablet Take 2 tablets (650 mg total) by mouth every 4 (four) hours. 08/17/22  Yes Lorella Nimrod, MD  albuterol (VENTOLIN HFA) 108 (90 Base) MCG/ACT inhaler Inhale 2 puffs into the lungs every 6 (six) hours as needed for wheezing or shortness of breath. 05/04/22  Yes End, Harrell Gave, MD  amiodarone (PACERONE) 200 MG tablet Take 1 tablet (200 mg total) by mouth daily. 07/16/22  Yes Hammock, Sheri, NP  amLODipine (NORVASC) 5 MG tablet Take 1 tablet (5 mg total) by mouth daily. 06/29/22 06/29/23 Yes End, Harrell Gave, MD  Ascorbic Acid 500 MG CHEW Chew 1 tablet by mouth daily.   Yes [provider]  b complex vitamins capsule Take 1 capsule by mouth daily.   Yes [provider]  bisoprolol  (ZEBETA) 5 MG tablet Take 1 tablet (5 mg total) by mouth 2 (two) times daily. 01/18/22  Yes Emeterio Reeve, DO  celecoxib (CELEBREX) 200 MG capsule Take 1 capsule (200 mg total) by mouth 2 (two) times daily. 08/17/22  Yes Lorella Nimrod, MD  cloNIDine (CATAPRES - DOSED IN MG/24 HR) 0.1 mg/24hr patch Place 1 patch (0.1 mg total) onto the skin every Friday. 03/27/21  Yes Vickie Epley, MD  cloNIDine (CATAPRES) 0.2 MG tablet Take 0.2 mg by mouth at bedtime. 03/05/22  Yes [provider]  diphenhydrAMINE (BENADRYL) 25 MG tablet Take 25 mg by mouth at bedtime as needed for allergies or sleep.   Yes [provider]  doxazosin (CARDURA) 8 MG tablet Take 8 mg by mouth daily. 03/29/20  Yes [provider]  dupilumab (DUPIXENT) 300 MG/2ML prefilled syringe Inject 300 mg into the skin every 14 (fourteen) days. Starting at day 15 for maintenance. 08/03/22  Yes Ralene Bathe, MD  ELIQUIS 5 MG TABS tablet TAKE 1 TABLET BY MOUTH TWICE DAILY 05/13/22  Yes  End, Harrell Gave, MD  ferric citrate (AURYXIA) 1 GM 210 MG(Fe) tablet Take 1 tablet by mouth 3 (three) times daily. 07/13/19  Yes [provider]  fluticasone (FLONASE) 50 MCG/ACT nasal spray Place 2 sprays into both nostrils daily as needed for allergies or rhinitis.   Yes [provider]  gentamicin cream (GARAMYCIN) 0.1 % Apply 1 application topically daily. 11/07/20  Yes Fritzi Mandes, MD  HYDROmorphone (DILAUDID) 1 MG/ML injection Inject 0.5 mLs (0.5 mg total) into the vein every 2 (two) hours as needed for moderate pain or severe pain. 08/17/22  Yes Lorella Nimrod, MD  hydrOXYzine (ATARAX) 25 MG tablet Take 25 mg by mouth 3 (three) times daily. 12/29/21  Yes [provider]  ketoconazole (NIZORAL) 2 % cream Apply 1 Application topically at bedtime. Qhs to feet for scaly rash on feet 05/26/22  Yes Ralene Bathe, MD  lactulose [facility]) 10 GM/15ML solution Take 15-30 mL by mouth daily as needed for  constipation 06/25/22  Yes End, Harrell Gave, MD  losartan (COZAAR) 50 MG tablet Take 1 tablet (50 mg total) by mouth daily. 08/18/22  Yes Lorella Nimrod, MD  multivitamin (RENA-VIT) TABS tablet Take 1 tablet by mouth at bedtime. 12/21/19  Yes [provider]  oxyCODONE (OXYCONTIN) 10 mg 12 hr tablet Take 1 tablet (10 mg total) by mouth every 12 (twelve) hours. 08/17/22  Yes Lorella Nimrod, MD  sevelamer (RENAGEL) 800 MG tablet Take 800 mg by mouth 3 (three) times daily with meals.   Yes [provider]  tacrolimus (PROTOPIC) 0.1 % ointment Apply topically 2 (two) times daily. Bid to itchy rash on body 05/26/22  Yes Ralene Bathe, MD  torsemide [facility]) 100 MG tablet Take 100 mg by mouth daily.   Yes [provider]  triamcinolone cream (KENALOG) 0.1 % Apply 1 Application topically as needed (itching). 04/13/22  Yes [provider]  lidocaine (LIDODERM) 5 % Place 1 patch onto the skin daily. Remove & Discard patch within 12 hours or as directed by MD 08/18/22   Lorella Nimrod, MD    Current Facility-Administered Medications  Medication Dose Route Frequency Provider Last Rate Last Admin   acetaminophen (OFIRMEV) IV 1,000 mg  1,000 mg Intravenous Q6H Ghimire, Henreitta Leber, MD       amiodarone (PACERONE) tablet 200 mg  200 mg Oral Daily Dorrell, Robert, MD   200 mg at 08/18/22 0917   amLODipine (NORVASC) tablet 5 mg  5 mg Oral Daily Dorrell, Robert, MD   5 mg at 08/18/22 0916   Ampicillin-Sulbactam (UNASYN) 3 g in sodium chloride 0.9 % 100 mL IVPB  3 g Intravenous Q24H Ghimire, Shanker M, MD 200 mL/hr at 08/19/22 1010 3 g at 08/19/22 1010   bisoprolol (ZEBETA) tablet 5 mg  5 mg Oral BID Emilee Hero, MD   5 mg at 08/18/22 0917   [START ON 08/20/2022] cloNIDine (CATAPRES - Dosed in mg/24 hr) patch 0.1 mg  0.1 mg Transdermal Q [place] Citizen, Herbie [place], MD       dialysis solution 1.5% low-MG/low-CA dianeal solution   Intraperitoneal Q24H Donato Heinz, MD   New Bag at  08/18/22 [id number]   dialysis solution 2.5% low-MG/low-CA dianeal solution   Intraperitoneal Q24H Elmarie Shiley, MD       docusate sodium (COLACE) capsule 100 mg  100 mg Oral BID Howerter, Justin B, DO   100 mg at 08/18/22 [id number]   doxazosin (CARDURA) tablet 8 mg  8 mg Oral Daily Emilee Hero, MD  8 mg at 08/18/22 [id number]   gentamicin cream (GARAMYCIN) 0.1 % 1 Application  1 Application Topical Daily Donato Heinz, MD   1 Application at [id number]   guaiFENesin (MUCINEX) 12 hr tablet 600 mg  600 mg Oral BID Mansy, Jan A, MD       lactulose (CHRONULAC) 10 GM/15ML solution 30 g  30 g Oral BID PRN Jonetta Osgood, MD       lidocaine (LIDODERM) 5 % 1 patch  1 patch Transdermal QHS Emilee Hero, MD   1 patch at 08/18/22 2337   losartan (COZAAR) tablet 50 mg  50 mg Oral Daily Emilee Hero, MD   50 mg at 08/18/22 0917   metoCLOPramide (REGLAN) injection 10 mg  10 mg Intravenous Q6H PRN Mansy, Jan A, MD   10 mg at 08/18/22 2336   naloxone [facility]) injection 0.4 mg  0.4 mg Intravenous PRN Jonetta Osgood, MD       nicotine (NICODERM CQ - dosed in mg/24 hours) patch 21 mg  21 mg Transdermal Daily Howerter, Justin B, DO   21 mg at 08/19/22 0823   ondansetron (ZOFRAN) tablet 4 mg  4 mg Oral Q6H PRN Emilee Hero, MD   4 mg at 08/18/22 0515   Or   ondansetron (ZOFRAN) injection 4 mg  4 mg Intravenous Q6H PRN Emilee Hero, MD   4 mg at 08/19/22 0433   oxyCODONE (Oxy IR/ROXICODONE) immediate release tablet 5-7.5 mg  5-7.5 mg Oral Q4H PRN Jesusita Oka, MD   5 mg at 08/18/22 1250   [START ON 08/22/2022] pantoprazole (PROTONIX) injection 40 mg  40 mg Intravenous Q12H Mansy, Jan A, MD       pantoprozole (PROTONIX) 80 mg /NS 100 mL infusion  8 mg/hr Intravenous Continuous Mansy, Jan A, MD 10 mL/hr at 08/19/22 0435 8 mg/hr at 08/19/22 0435   polyethylene glycol (MIRALAX / GLYCOLAX) packet 17 g  17 g Oral BID Ghimire, Henreitta Leber, MD       promethazine (PHENERGAN) tablet 12.5 mg  12.5 mg Oral Q6H PRN  Jonetta Osgood, MD       Or   promethazine (PHENERGAN) 12.5 mg in sodium chloride 0.9 % 50 mL IVPB  12.5 mg Intravenous Q6H PRN Jonetta Osgood, MD 200 mL/hr at 08/18/22 1809 12.5 mg at 08/18/22 1809   Or   promethazine (PHENERGAN) suppository 12.5 mg  12.5 mg Rectal Q6H PRN Jonetta Osgood, MD       promethazine (PHENERGAN) 12.5 mg in sodium chloride 0.9 % 50 mL IVPB  12.5 mg Intravenous Q6H PRN Mansy, Jan A, MD       scopolamine (TRANSDERM-SCOP) 1 MG/3DAYS 1.5 mg  1 patch Transdermal Q72H Howerter, Justin B, DO   1.5 mg at 08/18/22 [id number]   sevelamer carbonate (RENVELA) tablet 800 mg  800 mg Oral TID WC Emilee Hero, MD   800 mg at 08/18/22 1250    Allergies as of 08/17/2022 - Review Complete 08/17/2022  Allergen Reaction Noted   Irbesartan Other (See Comments) 07/26/2017   Cymbalta [duloxetine hcl]  04/12/2019   Hydralazine Other (See Comments) 08/06/2020   Imdur [isosorbide nitrate] Other (See Comments) 08/06/2020   Rosuvastatin  07/30/2021   Atorvastatin Other (See Comments) [id number]   Bystolic [nebivolol hcl]  05/11/2017    Family History  Problem Relation Age of Onset   Hypertension Mother    Cancer Mother        breast   Aneurysm Mother  Coronary artery disease Father    Hypertension Father    Stroke Father 67   Heart disease Father    Heart attack Father 25   Aneurysm Maternal Grandmother        brain   Aneurysm Paternal Grandmother        brain   Coronary artery disease Paternal Grandfather    Heart disease Brother        valvular heart disease   COPD Brother    Hypertension Brother    Stroke Paternal Uncle     Social History   Socioeconomic History   Marital status: Married    Spouse name: Robyn   Number of children: Not on file   Years of education: Not on file   Highest education level: Not on file  Occupational History   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  Tobacco Use   Smoking status: Every Day    Packs/day: 1.00     Years: 50.00    Additional pack years: 0.00    Total pack years: 50.00    Types: Cigarettes   Smokeless tobacco: Never  Vaping Use   Vaping Use: Former   Devices: tried but did not like  Substance and Sexual Activity   Alcohol use: Not Currently   Drug use: No   Sexual activity: Yes  Other Topics Concern   Not on file  Social History Narrative   Not on file   Social Determinants of Health   Financial Resource Strain: Low Risk  (04/22/2022)   Overall Financial Resource Strain (CARDIA)    Difficulty of Paying Living Expenses: Not hard at all  Food Insecurity: No Food Insecurity (08/17/2022)   Hunger Vital Sign    Worried About Running Out of Food in the Last Year: Never true    [place] in the Last Year: Never true  Transportation Needs: No Transportation Needs (08/17/2022)   PRAPARE - Hydrologist (Medical): No    Lack of Transportation (Non-Medical): No  Physical Activity: Insufficiently Active (04/22/2022)   Exercise Vital Sign    Days of Exercise per Week: 3 days    Minutes of Exercise per Session: 20 min  Stress: No Stress Concern Present (04/22/2022)   Index    Feeling of Stress : Not at all  Social Connections: Unknown (04/22/2022)   Social Connection and Isolation Panel [NHANES]    Frequency of Communication with Friends and Family: Not on file    Frequency of Social Gatherings with Friends and Family: Not on file    Attends Religious Services: Not on file    Active Member of Clubs or Organizations: Not on file    Attends Archivist Meetings: Not on file    Marital Status: Married  Intimate Partner Violence: Not At Risk (08/17/2022)   Humiliation, Afraid, Rape, and Kick questionnaire    Fear of Current or Ex-Partner: No    Emotionally Abused: No    Physically Abused: No    Sexually Abused: No    Review of Systems: Pertinent positive and negative  review of systems were noted in the above HPI section.  All other review of systems was otherwise negative.   Physical Exam: Vital signs in last 24 hours: Temp:  [97.2 F (36.2 C)-98 F (36.7 C)] 97.9 F (36.6 C) (03/28 1120) Pulse Rate:  [57-86] 64 (03/28 1120) Resp:  [13-20] 18 (03/28 1120) BP: (147-184)/(49-80) 157/55 (03/28  1120) SpO2:  [73 %-100 %] 97 % (03/28 1120) Weight:  [66.3 kg] 66.3 kg (03/28 0500) Last BM Date : 08/15/22 General:    Well-developed, well-nourished, elderly white male lethargic/somnolent, unable to participate in conversation ,in NAD Head:  Normocephalic and atraumatic. Eyes:  Sclera clear, no icterus.   Conjunctiva pink. Ears:  Normal auditory acuity. Nose:  No deformity, discharge,  or lesions. Mouth:  No deformity or lesions.  Small amount of dark material around the mouth Neck:  Supple; no masses or thyromegaly. Lungs: Scattered wheeze and rhonchi in the right lung  heart:  Regular rate and rhythm; no murmurs, clicks, rubs,  or gallops. Abdomen:  Soft, appears to have some tenderness in the right upper quadrant, BS active,nonpalp mass or hsm.   Rectal: Not done Msk:  Symmetrical without gross deformities. . Pulses:  Normal pulses noted. Extremities:  Without clubbing or edema. Neurologic: Somnolent/lethargic Skin:  Intact without significant lesions or rashes..   Intake/Output from previous day: 03/27 0701 - 03/28 0700 In: -[place] [IV Piggyback:91] Out: 500 [Urine:500] Intake/Output this shift: Total I/O In: -344  Out: -   Lab Results: Recent Labs    08/17/22 0436 08/18/22 2140 08/19/22 0929  WBC 11.4* 19.1* 16.0*  HGB 9.1* 9.8* 9.1*  HCT 28.4* 29.0* 27.3*  PLT 148* 165 170   BMET Recent Labs    08/17/22 0436 08/18/22 2140 08/19/22 0929  NA 138 137 136  K 5.1 4.7 4.9  CL 110 102 103  CO2 23 24 22   GLUCOSE 96 203* 72  BUN 48* 49* 50*  CREATININE 7.42* 8.09* 8.18*  CALCIUM 7.8* 8.1* 7.5*   LFT Recent Labs     08/18/22 2140 08/19/22 0929  PROT 4.9*  --   ALBUMIN 1.9* 1.7*  AST 13*  --   ALT 13  --   ALKPHOS 122  --   BILITOT 0.6  --    PT/INR Recent Labs    08/17/22 0436  LABPROT 14.7  INR 1.2   Hepatitis Panel No results for input(s): "HEPBSAG", "HCVAB", "HEPAIGM", "HEPBIGM" in the last 72 hours.    IMPRESSION:  #67 77 year old white male admitted 2 days ago in transfer from [facility] after a fall sustaining left rib fractures and with concern for flail chest due to displaced left seventh and eighth rib fractures  #2 acute nausea and vomiting last night into early morning hours multiple episodes of vomiting eventually coffee-ground appearing  No further vomiting or coffee-ground emesis today since 7 AM  2 view abdomen today unremarkable  Etiology of coffee-ground emesis not clear, rule out retch gastropathy, acute esophagitis, Mallory-Weiss tear versus other  #3 anemia chronic no significant drop in hemoglobin since arrival here #4 chronic anticoagulation-generally on Eliquis on hold since 3/25 #5 end-stage renal disease on peritoneal dialysis-CT from 08/16/2022 had shown increased volume of free intraperitoneal air more than expected  #6 acute aspiration episode secondary to #2, now with chest x-ray showing right basilar consolidation and collapse moderate right effusion Requiring nasal oxygen/2 L #7 history of atrial fibs flutter 8.  History of prior CVA/TIA 9.  Peripheral arterial disease 10.  Diabetes mellitus 11.  Status post renal cell CA left nephrectomy #12 cardiomyopathy with EF 30 to 35%  Plan; keep n.p.o. IV PPI as doing Zofran 4 mg every 6 hours-May offer scheduled today Hemoglobins every 6 hours and transfuse for hemoglobin less than 7 Possible EGD later this afternoon with Dr. Havery Moros.  Patient is unable to consent for  himself at this time we will need to speak with his spouse.  He is not actively bleeding today thus far and given new aspiration  event, lethargy and now with oxygen requirement may be best to hold on sedation today and consider EGD in the next 24-36 hrs.      Kalan Rinn PA-C 08/19/2022, 12:15 PM

## 2022-08-19 NOTE — Care Management Important Message (Signed)
Important Message  Patient Details  Name: [name] MRN: [id number] Date of Birth: Mar 13, 1946   Medicare Important Message Given:  Yes     Orbie Pyo 08/19/2022, 3:24 PM

## 2022-08-19 NOTE — Progress Notes (Signed)
Summary of tonights events:  08/18/22 19:30 Received report from dayshift nurse, Patient visibly vomiting and dry heaving as well with facial grimacing, patient reports vomiting has been most of today. Patient is hooked up to Peritoneal dialysis.   19:36 Administered Zofran and Dilaudid.   21:00 Oxygen drops to 84% but goes back up on its own.  22:43 Sats drop to 81%, this nurse places oxygen back on patients nose, however he is mouth breathing so I placed nasal cannula in mouth and oxygen sats pick back up to mid 80's low 90's.   23:30 Notify Dr. Miguel Aschoff concerning patients vomiting that has not been relieved with Zofran or phenegran. Inform of him spitting up black fluid/chunks and my concern for patient having a possible perforation or bowel obstruction. He orders Reglan 10mg  IVP and for new labs to be drawn. Physician tells me to notify Trauma and exits conversation on secure chat.   23:36 Administer, Reglan IVP and Lidocaine patch, hold all PO medications as patient is still nauseous and belching and coughing up black fluid/chunks.   23:41 Notify Dr. Lorriane Shire via secure chat first, then I page him shortly after. Notified him of patients increased WBC count. Received a return call. This physician  asked if he had stool or discolored peritoneal fluid coming thru his PD fluid and tI notified him that the patient had clear fluid thru PD cath. This physician had no concern for bowel perforation due to no stool noted in peritoneal dialysis fluid and stated they would watch him.   08/19/22 00:53 Oxygen drops to 81% I place patient on non rebreather as I could not find a simple mask,  patient is mouth breathing. Lung sounds increasingly wet/ coarse. Peritoneal Dialysis running, abdomen distended but still palpable., not completely tight. Work with patient getting his oxygen up until 01:07. Im able to get sats back up with Non Rebreather 10-15L.   00:12 Message Dr. Trudie Buckler, notify patient's oxygen  desaturated low 80's sustained, placed him on a non rebreather at 8LPM and that his lung sounds are wet, and he is still currently doing Peritoneal Dialysis and stomach is full, decreasing his lung expansion. Asked if we could do IV Diuresis.   01:43 Notify that sats are in the 80's again.   01:48 Dr. Trudie Buckler responds and ask me to notify respiratory to have them put him on a bipap.  01:49 I ask Dr. Trudie Buckler several question and  we conversate back and forth until...  02:07 Abdominal US ordered. And physician came to floor to see patient.   02:14 Dr. Trudie Buckler ask me to notify Nephrology.  02:26 and 02:48am Try calling hemodialysis nurse, No answer  02:30 IV Lasix and phenegran given by charge RN, I page Nephrologist Dr. Leanora Cover   02:49 Phone conversation with Dr. Johnney Ou, she states she will notify HD nurse to stop treatment. She calls back 02:52 and lets me know they are giving dialysis in ICU but will be up when finished to stop his PD.   03:00-04:00  I attempted to get new IV as well as charge nurse and other RN, order placed for IV team and Foley insertion to monitor output from IV Lasix. Patient was incontinent one time before foley could be placed as IV team was working to get new IV placement as 20g RAC was leaking.   03:39 Notify Dr. Trudie Buckler, KUB didn't show anything but he continues to cough up and vomit black secretions. Still waiting on Dialysis nurse to  come and take him off PD per dialysis dr. Eduardo Osier fluid seems to have come off stomach and respiratory problems are much improved. Now on 3L nasal cannula instead of 6-8L non rebreather. Currently IV team is here, IV went bad and we have been unable to stick a new iv for him to get the protonix.   04:07 Protonix Bolus given, and bathed patient and changed linens. Keeping patient sitting up. Patient is lethargic and sleeping through most care.   04:35 Protonix drip started and Dilaudid and Zofran given.   06:45 Oxygen dropped again 73% and  placed back on Non Rebreather at low rate 3L incase patient desats during report, oxygen can quickly be increased with the mask already on.    06:57  Patient still connected to PD machine but it does not appear to be running. Dayshift HD nurse calls back. Informed of patients troubles (fluid overload, difficulty breathing, hypoxia with fractured ribs) with 2,043ml intake for the last 2 nights.   07:30 Report given to dayshift nurse.  07:54 Briefly told dayshift Dr of the events of last night.

## 2022-08-20 ENCOUNTER — Inpatient Hospital Stay (HOSPITAL_COMMUNITY): Payer: Medicare PPO

## 2022-08-20 DIAGNOSIS — S2242XD Multiple fractures of ribs, left side, subsequent encounter for fracture with routine healing: Secondary | ICD-10-CM | POA: Diagnosis not present

## 2022-08-20 DIAGNOSIS — I739 Peripheral vascular disease, unspecified: Secondary | ICD-10-CM

## 2022-08-20 DIAGNOSIS — I2510 Atherosclerotic heart disease of native coronary artery without angina pectoris: Secondary | ICD-10-CM | POA: Diagnosis not present

## 2022-08-20 DIAGNOSIS — J9691 Respiratory failure, unspecified with hypoxia: Secondary | ICD-10-CM

## 2022-08-20 DIAGNOSIS — Z992 Dependence on renal dialysis: Secondary | ICD-10-CM

## 2022-08-20 DIAGNOSIS — N186 End stage renal disease: Secondary | ICD-10-CM | POA: Diagnosis not present

## 2022-08-20 DIAGNOSIS — I120 Hypertensive chronic kidney disease with stage 5 chronic kidney disease or end stage renal disease: Secondary | ICD-10-CM

## 2022-08-20 DIAGNOSIS — Z9189 Other specified personal risk factors, not elsewhere classified: Secondary | ICD-10-CM

## 2022-08-20 DIAGNOSIS — R112 Nausea with vomiting, unspecified: Secondary | ICD-10-CM | POA: Diagnosis not present

## 2022-08-20 DIAGNOSIS — E1129 Type 2 diabetes mellitus with other diabetic kidney complication: Secondary | ICD-10-CM | POA: Diagnosis not present

## 2022-08-20 DIAGNOSIS — K920 Hematemesis: Secondary | ICD-10-CM | POA: Diagnosis not present

## 2022-08-20 DIAGNOSIS — I10 Essential (primary) hypertension: Secondary | ICD-10-CM | POA: Diagnosis not present

## 2022-08-20 LAB — CBC
HCT: 25.7 % — ABNORMAL LOW (ref 39.0–52.0)
Hemoglobin: 8.4 g/dL — ABNORMAL LOW (ref 13.0–17.0)
MCH: 28.8 pg (ref 26.0–34.0)
MCHC: 32.7 g/dL (ref 30.0–36.0)
MCV: 88 fL (ref 80.0–100.0)
Platelets: 156 10*3/uL (ref 150–400)
RBC: 2.92 MIL/uL — ABNORMAL LOW (ref 4.22–5.81)
RDW: 15.5 % (ref 11.5–15.5)
WBC: 10.8 10*3/uL — ABNORMAL HIGH (ref 4.0–10.5)
nRBC: 0 % (ref 0.0–0.2)

## 2022-08-20 LAB — RENAL FUNCTION PANEL
Albumin: <1.5 g/dL — ABNORMAL LOW (ref 3.5–5.0)
Anion gap: 12 (ref 5–15)
BUN: 52 mg/dL — ABNORMAL HIGH (ref 8–23)
CO2: 22 mmol/L (ref 22–32)
Calcium: 7.6 mg/dL — ABNORMAL LOW (ref 8.9–10.3)
Chloride: 101 mmol/L (ref 98–111)
Creatinine, Ser: 8.24 mg/dL — ABNORMAL HIGH (ref 0.61–1.24)
GFR, Estimated: 6 mL/min — ABNORMAL LOW (ref >60)
Glucose, Bld: 77 mg/dL (ref 70–99)
Phosphorus: 6.8 mg/dL — ABNORMAL HIGH (ref 2.5–4.6)
Potassium: 4.8 mmol/L (ref 3.5–5.1)
Sodium: 135 mmol/L (ref 135–145)

## 2022-08-20 LAB — HEMOGLOBIN AND HEMATOCRIT, BLOOD
HCT: 25.8 % — ABNORMAL LOW (ref 39.0–52.0)
Hemoglobin: 8.4 g/dL — ABNORMAL LOW (ref 13.0–17.0)

## 2022-08-20 MED ORDER — CHLORHEXIDINE GLUCONATE CLOTH 2 % EX PADS
6.0000 | MEDICATED_PAD | Freq: Every day | CUTANEOUS | Status: DC
  Administered 2022-08-20 – 2022-08-26 (×6): 6 via TOPICAL

## 2022-08-20 MED ORDER — GENTAMICIN SULFATE 0.1 % EX CREA
1.0000 | TOPICAL_CREAM | Freq: Every day | CUTANEOUS | Status: DC
  Administered 2022-08-20 – 2022-08-26 (×5): 1 via TOPICAL
  Filled 2022-08-20: qty 15

## 2022-08-20 MED ORDER — DELFLEX-LC/1.5% DEXTROSE 344 MOSM/L IP SOLN: INTRAPERITONEAL | Status: DC

## 2022-08-20 MED ORDER — TRAMADOL HCL 50 MG PO TABS: 50.0000 mg | ORAL_TABLET | Freq: Four times a day (QID) | ORAL | Status: DC | PRN

## 2022-08-20 MED ORDER — OXYCODONE HCL 5 MG PO TABS
5.0000 mg | ORAL_TABLET | ORAL | Status: DC | PRN
  Administered 2022-08-22 – 2022-08-25 (×2): 5 mg via ORAL
  Filled 2022-08-20 (×4): qty 1

## 2022-08-20 MED ORDER — ACETAMINOPHEN 500 MG PO TABS
1000.0000 mg | ORAL_TABLET | Freq: Three times a day (TID) | ORAL | Status: DC
  Administered 2022-08-20 – 2022-08-21 (×5): 1000 mg via ORAL
  Filled 2022-08-20 (×6): qty 2

## 2022-08-20 NOTE — Progress Notes (Signed)
PD post treatment note  PD treatment completed. Patient tolerated treatment well. PD effluent is clear. No specimen collected.  PD exit site clean, dry and intact. Patient is awake, oriented and in no acute distress.  Report given to bedside nurse.   Post treatment VS: 140/69   Total UF removed:  404 mls  Post treatment weight: 71 kg    08/20/22 0457  Peritoneal Catheter Left lower abdomen Continuous ambulatory  Placement Date/Time: 03/01/19 1310   Procedural Verification: Medical records & consent reviewed;Relevant studies,results and images reviewed  Time out: Correct Patient;Correct Site;Correct Procedure;Special equipment/requirements available  Person In...  Site Assessment Clean, Dry, Intact  Drainage Description None  Catheter status Deaccessed  Dressing Gauze/Drain sponge  Dressing Status Clean, Dry, Intact  Dressing Intervention Dressing reinforced  Completion  Treatment Status Complete  Initial Drain Volume 87  Average Dwell Time-Hour(s) 1  Average Dwell Time-Min(s) 30  Average Drain Time 71  Total Therapy Volume 3999  Total Therapy Time-Hour(s) 11  Total Therapy Time-Min(s) 4  Weight after Drain 156 lb 8.4 oz (71 kg)  Effluent Appearance Clear  Cell Count on Daytime Exchange N/A  Fluid Balance - CCPD  Total UF (- value on cycler, pt gain) -404 mL  Procedure Comments  Tolerated treatment well? Yes  Peritoneal Dialysis Comments tx completed as expected , tolerated well  Education / Care Plan  Dialysis Education Provided Yes  Documented Education in [place] documentation  Hand-off Given Given to shift RN/LPN  Report given to (Full Name) Peggye Fothergill, RN  Hand-off Received Received from shift RN/LPN  Report received from (Full Name) Aritha Huckeba, Aldean Pipe, RN

## 2022-08-20 NOTE — Progress Notes (Signed)
PT Cancellation Note  Patient Details Name: [name] MRN: [id number] DOB: February 22, 1946   Cancelled Treatment:    Reason Eval/Treat Not Completed: (P) Fatigue/lethargy limiting ability to participate (pt opening eyes to voice and tactile simuli, only maintaining eyes open long enough to ask to rest.) Will check back in afternoon to continue with POC.  Audry Riles. PTA Acute Rehabilitation Services Office: [place] 08/20/2022, 12:35 PM

## 2022-08-20 NOTE — Progress Notes (Signed)
Physical Therapy Treatment Patient Details Name: [name] MRN: [id number] DOB: [birth year] Today's Date: 08/20/2022   History of Present Illness Pt is a 77 y/o M admitted to [facility] from [facility] on 3/25 after a fall resulting in nondisplaced L 7th and 8th rib fractures, seen on chest CT. Imaging also revealed free air in abdomen. PMHx: CHF, A-fib, ESRD on peritoneal dialysis, HTN, HLD, CVA    PT Comments    Pt greeted supine in bed, sleeping but able to be awoken, and agreeable to session. Pt continues to be limited by fatigue, impaired balance/postural reactions and intermittent confusion. Pt needing up to min A to complete bed mobility with pt demonstrating poor initiation at start. Pt needing increased assist and RW support this session vs previous sessions to complete functional transfers and gait secondary to poor balance and pt requiring cues for RW manage and physical assist for balance. Pending progress, pt may need further therapies <3hrs/day to address deficits before d/c to home. Will continue to follow acutely.    Recommendations for follow up therapy are one component of a multi-disciplinary discharge planning process, led by the attending physician.  Recommendations may be updated based on patient status, additional functional criteria and insurance authorization.  Follow Up Recommendations       Assistance Recommended at Discharge Frequent or constant Supervision/Assistance  Patient can return home with the following A little help with walking and/or transfers;Assist for transportation;Help with stairs or ramp for entrance   Equipment Recommendations  Other (comment) (will continue to assess)    Recommendations for Other Services       Precautions / Restrictions Precautions Precautions: Fall Precaution Comments: L 7/8 rib fracture and maybe 12th, peritoneal dialysis; monitor O2 Restrictions Weight Bearing Restrictions: No     Mobility  Bed  Mobility Overal bed mobility: Needs Assistance Bed Mobility: Supine to Sit, Sit to Supine     Supine to sit: Min assist, HOB elevated Sit to supine: Min assist   General bed mobility comments: increase time and rib pain during bed mobiltiy, assist to manage trunk    Transfers Overall transfer level: Needs assistance Equipment used: Rolling walker (2 wheels), None Transfers: Sit to/from Stand, Bed to chair/wheelchair/BSC Sit to Stand: Min assist, Mod assist           General transfer comment: min A on initial stand with no AD, but pt reaching for support on bed rail and sink, pt unable to maintain balance without AD/BUE support and external assist this session, pt needing up to mod A  to power up to stand with RW support toward end of session due to fatigue    Ambulation/Gait Ambulation/Gait assistance: Min assist, Mod assist Gait Distance (Feet): 102 Feet Assistive device: Rolling walker (2 wheels) Gait Pattern/deviations: Step-through pattern, Decreased stride length, Drifts right/left, Narrow base of support Gait velocity: decreased     General Gait Details: slow intermittenly unsteady gait with RW, pt needing cues to widen BOS and to step into RW as pt with tendency to push RW too far out in front of him, pt needing increased assist up to mod A after ~75' to steady and manage RW as pt fatigue increasing and pt shifting RW to R with body almost outside RW on L   Stairs             Wheelchair Mobility    Modified Rankin (Stroke Patients Only)       Balance Overall balance assessment: Needs assistance Sitting-balance support:  No upper extremity supported, Feet supported Sitting balance-Leahy Scale: Fair Sitting balance - Comments: able to maintain balance on EOB before returning to sidelying Postural control: Posterior lean, Right lateral lean Standing balance support: Single extremity supported, Bilateral upper extremity supported Standing balance-Leahy  Scale: Poor Standing balance comment: reliant on RW and external assist to maintain balance                            Cognition Arousal/Alertness: Lethargic Behavior During Therapy: Flat affect Overall Cognitive Status: No family/caregiver present to determine baseline cognitive functioning                                 General Comments: pt continues to be very dorwsy throughout session, A&Ox4 but some confusion present, asked to reach for bed before coming to sit and pt stepping forward and placing both hands on sink        Exercises      General Comments General comments (skin integrity, edema, etc.): VSS on supplemetnal O2      Pertinent Vitals/Pain Pain Assessment Pain Assessment: Faces Faces Pain Scale: Hurts little more Pain Location: ribs Pain Descriptors / Indicators: Aching, Grimacing, Guarding Pain Intervention(s): Monitored during session, Limited activity within patient's tolerance    Home Living                          Prior Function            PT Goals (current goals can now be found in the care plan section) Acute Rehab PT Goals Patient Stated Goal: none stated PT Goal Formulation: With patient Time For Goal Achievement: 09/01/22 Progress towards PT goals: Progressing toward goals    Frequency    Min 3X/week      PT Plan      Co-evaluation              AM-PAC PT "6 Clicks" Mobility   Outcome Measure  Help needed turning from your back to your side while in a flat bed without using bedrails?: A Little Help needed moving from lying on your back to sitting on the side of a flat bed without using bedrails?: A Little Help needed moving to and from a bed to a chair (including a wheelchair)?: A Little Help needed standing up from a chair using your arms (e.g., wheelchair or bedside chair)?: A Lot Help needed to walk in hospital room?: A Lot Help needed climbing 3-5 steps with a railing? : Total 6  Click Score: 14    End of Session Equipment Utilized During Treatment: Gait belt;Oxygen Activity Tolerance: Patient limited by fatigue Patient left: Other (comment);in bed;with call bell/phone within reach;with bed alarm set (with fall mat placed) Nurse Communication: Mobility status PT Visit Diagnosis: Unsteadiness on feet (R26.81);History of falling (Z91.81);Difficulty in walking, not elsewhere classified (R26.2)     Time: [id number] PT Time Calculation (min) (ACUTE ONLY): 27 min  Charges:  $Gait Training: 8-22 mins $Therapeutic Activity: 8-22 mins                     Mayzee Reichenbach R. PTA Acute Rehabilitation Services Office: [place] 08/20/2022, 3:02 PM

## 2022-08-20 NOTE — Progress Notes (Addendum)
PROGRESS NOTE        PATIENT DETAILS Name: [name] Age: 77 y.o. Sex: male Date of Birth: 1946-01-21 Admit Date: 08/17/2022 Admitting Physician Emilee Hero, MD [id number], Aris Everts, MD  Brief Summary: Patient is a 77 y.o.  male with history of ESRD on peritoneal dialysis, A-fib on Eliquis, HTN, HLD-who was transferred from [facility] for concern for a flail chest following a fall with rib fracture.  Significant events: 3/25-3/26>> hospitalization at [facility] following a fall-concern for flail chest-transferred to [facility] at [facility] for trauma evaluation. 3/27>> numerous episodes of vomiting evening-into night-vomitus became coffee-ground-some intermittent hypoxia.  Kept n.p.o.-PPI infusion started.  Significant studies: 3/25>> CT chest: Nondisplaced left seventh/eighth rib fracture, possible 12th left rib fracture. 3/25>> CT abdomen/pelvis: Extensive pneumoperitoneum-in the setting of PD catheter.  6.4 cm left cystic lesion-suggestive of a hemorrhagic cyst.  Significant microbiology data: 3/28>> blood cultures: No growth  Procedures: None  Consults: Nephrology Trauma surgery GI  Subjective: Much better today-no vomiting.  Continues to complain of pain in the left side of his chest.  Remains on 3-4 L of oxygen.  Objective: Vitals: Blood pressure (!) 139/53, pulse 60, temperature 98.4 F (36.9 C), temperature source Oral, resp. rate (!) 24, weight 71.2 kg, SpO2 90 %.   Exam: Gen Exam:Alert awake-not in any distress HEENT:atraumatic, normocephalic Chest: B/L clear to auscultation anteriorly CVS:S1S2 regular Abdomen:soft non tender, non distended Extremities:no edema Neurology: Non focal Skin: no rash  Pertinent Labs/Radiology:    Latest Ref Rng & Units 08/20/2022    6:39 AM 08/20/2022   12:41 AM 08/19/2022    6:55 PM  CBC  WBC 4.0 - 10.5 K/uL 10.8     Hemoglobin 13.0 - 17.0 g/dL 8.4  8.4  8.8   Hematocrit 39.0 - 52.0 % 25.7  25.8  26.6    Platelets 150 - 400 K/uL 156       Lab Results  Component Value Date   NA 135 08/20/2022   K 4.8 08/20/2022   CL 101 08/20/2022   CO2 22 08/20/2022      Assessment/Plan: Mechanical fall with left seventh, eighth and possible 12th rib fracture Supportive care Continue incentive spirometry Scheduled Tylenol Minimize narcotics as much as most possible Mobilize with PT/OT-keep out of bed in chair for several hours a day.  Intractable nausea/vomiting Upper GI bleed with coffee-ground emesis-?  Mallory-Weiss tear No further vomiting Mild drop in hemoglobin GI following for possible endoscopy evaluation over the next several days Remains on PPI Eliquis remains on hold.  Acute hypoxic respiratory failure Initially felt to be due to rib fracture/splinting/atelectasis-suspect may have aspirated overnight on 3/27 leading to more worsening of hypoxemia Stable on 3-4 L of oxygen Continue IV Unasyn Incentive spirometry/pulmonary toileting/mobilization encouraged again today.  Aspiration pneumonia Much improved Leukocytosis. Cultures negative so far IV Unasyn x 5 days total.    Left renal hemorrhagic cyst Incidental finding on CT abdomen Eliquis remains on hold  ESRD on peritoneal dialysis Nephrology following  PAF Sinus rhythm on telemetry Continue amiodarone Eliquis on hold-due to possible hemorrhagic cyst-and now due to possible upper GI bleeding with Mallory-Weiss tear  Chronic HFrEF Volume status stable Volume overload PD Also on high-dose Demadex.  CAD (LHC 06/30/2022-two-vessel disease-70-80% mid LAD stenosis, chronic total occlusion of RCA with left-right collaterals-optimization of medical therapy recommended) Not on antiplatelets as he is  on Eliquis Intolerant to statin Currently without any anginal symptoms-his left-sided musculoskeletal chest pain is from rib fractures.  HTN BP reasonable Continue amlodipine/bisoprolol/clonidine/doxazosin and losartan.     BMI: Estimated body mass index is 25.34 kg/m as calculated from the following:   Height as of an earlier encounter on 08/17/22: 5\' 6"  (1.676 m).   Weight as of this encounter: 71.2 kg.   Code status:   Code Status: Full Code   DVT Prophylaxis: Place TED hose Start: 08/17/22 2148   Family Communication: Hendrix Rudenko [id number] 3/29   Disposition Plan: Status is: Inpatient Remains inpatient appropriate because: Severity of illness   Planned Discharge Destination:Home health   Diet: Diet Order             Diet NPO time specified Except for: Sips with Meds  Diet effective midnight           Diet full liquid Room service appropriate? Yes; Fluid consistency: Thin  Diet effective now                     Antimicrobial agents: Anti-infectives (From admission, onward)    Start     Dose/Rate Route Frequency Ordered Stop   08/19/22 1100  Ampicillin-Sulbactam (UNASYN) 3 g in sodium chloride 0.9 % 100 mL IVPB        3 g 200 mL/hr over 30 Minutes Intravenous Every 24 hours 08/19/22 0957          MEDICATIONS: Scheduled Meds:  amiodarone  200 mg Oral Daily   amLODipine  5 mg Oral Daily   bisoprolol  5 mg Oral BID   Chlorhexidine Gluconate Cloth  6 each Topical Daily   cloNIDine  0.1 mg Transdermal Q Fri   docusate sodium  100 mg Oral BID   doxazosin  8 mg Oral Daily   gentamicin cream  1 Application Topical Daily   guaiFENesin  600 mg Oral BID   lidocaine  1 patch Transdermal QHS   losartan  50 mg Oral Daily   nicotine  21 mg Transdermal Daily   [START ON 08/22/2022] pantoprazole  40 mg Intravenous Q12H   polyethylene glycol  17 g Oral BID   scopolamine  1 patch Transdermal Q72H   sevelamer carbonate  800 mg Oral TID WC   Continuous Infusions:  ampicillin-sulbactam (UNASYN) IV 3 g (08/20/22 0821)   dialysis solution 1.5% low-MG/low-CA     pantoprazole 8 mg/hr (08/19/22 2313)   promethazine (PHENERGAN) injection (IM or IVPB) Stopped (08/18/22 1826)    promethazine (PHENERGAN) injection (IM or IVPB)     PRN Meds:.lactulose, metoCLOPramide (REGLAN) injection, naLOXone (NARCAN)  injection, ondansetron **OR** ondansetron (ZOFRAN) IV, oxyCODONE, promethazine **OR** promethazine (PHENERGAN) injection (IM or IVPB) **OR** promethazine, promethazine (PHENERGAN) injection (IM or IVPB)   I have personally reviewed following labs and imaging studies  LABORATORY DATA: CBC: Recent Labs  Lab 08/16/22 2116 08/17/22 0436 08/18/22 2140 08/19/22 0929 08/19/22 1304 08/19/22 1855 08/20/22 0041 08/20/22 0639  WBC 12.9* 11.4* 19.1* 16.0*  --   --   --  10.8*  NEUTROABS 10.2*  --  17.3*  --   --   --   --   --   HGB 9.9* 9.1* 9.8* 9.1* 8.8* 8.8* 8.4* 8.4*  HCT 30.2* 28.4* 29.0* 27.3* 26.4* 26.6* 25.8* 25.7*  MCV 88.3 87.7 86.6 87.8  --   --   --  88.0  PLT 165 148* 165 170  --   --   --  156     Basic Metabolic Panel: Recent Labs  Lab 08/16/22 2116 08/17/22 0436 08/18/22 2140 08/19/22 0929 08/20/22 0041  NA 134* 138 137 136 135  K 5.0 5.1 4.7 4.9 4.8  CL 101 110 102 103 101  CO2 24 23 24 22 22   GLUCOSE 127* 96 203* 72 77  BUN 48* 48* 49* 50* 52*  CREATININE 7.41* 7.42* 8.09* 8.18* 8.24*  CALCIUM 8.0* 7.8* 8.1* 7.5* 7.6*  PHOS  --  6.6*  --  6.5* 6.8*     GFR: Estimated Creatinine Clearance: 6.9 mL/min (A) (by C-G formula based on SCr of 8.24 mg/dL (H)).  Liver Function Tests: Recent Labs  Lab 08/16/22 2116 08/18/22 2140 08/19/22 0929 08/20/22 0041  AST 12* 13*  --   --   ALT 12 13  --   --   ALKPHOS 107 122  --   --   BILITOT 0.5 0.6  --   --   PROT 5.2* 4.9*  --   --   ALBUMIN 2.1* 1.9* 1.7* <1.5*    No results for input(s): "LIPASE", "AMYLASE" in the last 168 hours. No results for input(s): "AMMONIA" in the last 168 hours.  Coagulation Profile: Recent Labs  Lab 08/17/22 0436  INR 1.2     Cardiac Enzymes: No results for input(s): "CKTOTAL", "CKMB", "CKMBINDEX", "TROPONINI" in the last 168 hours.  BNP  (last 3 results) No results for input(s): "PROBNP" in the last 8760 hours.  Lipid Profile: No results for input(s): "CHOL", "HDL", "LDLCALC", "TRIG", "CHOLHDL", "LDLDIRECT" in the last 72 hours.  Thyroid Function Tests: No results for input(s): "TSH", "T4TOTAL", "FREET4", "T3FREE", "THYROIDAB" in the last 72 hours.  Anemia Panel: No results for input(s): "VITAMINB12", "FOLATE", "FERRITIN", "TIBC", "IRON", "RETICCTPCT" in the last 72 hours.  Urine analysis:    Component Value Date/Time   COLORURINE STRAW (A) 11/28/2020 1055   APPEARANCEUR CLEAR (A) 11/28/2020 1055   APPEARANCEUR Clear 01/03/2012 1117   LABSPEC 1.008 11/28/2020 1055   LABSPEC 1.004 01/03/2012 1117   PHURINE 7.0 11/28/2020 1055   GLUCOSEU 50 (A) 11/28/2020 1055   GLUCOSEU 100 (A) 01/21/2020 1223   HGBUR NEGATIVE 11/28/2020 1055   BILIRUBINUR NEGATIVE 11/28/2020 1055   BILIRUBINUR neg 07/30/2013 1127   BILIRUBINUR Negative 01/03/2012 1117   KETONESUR NEGATIVE 11/28/2020 1055   PROTEINUR 30 (A) 11/28/2020 1055   UROBILINOGEN 0.2 01/21/2020 1223   NITRITE NEGATIVE 11/28/2020 1055   LEUKOCYTESUR NEGATIVE 11/28/2020 1055   LEUKOCYTESUR Negative 01/03/2012 1117    Sepsis Labs: Lactic Acid, Venous    Component Value Date/Time   LATICACIDVEN 0.8 08/19/2022 0929    MICROBIOLOGY: Recent Results (from the past 240 hour(s))  Culture, blood (Routine X 2) w Reflex to ID Panel     Status: None (Preliminary result)   Collection Time: 08/19/22  9:29 AM   Specimen: BLOOD LEFT HAND  Result Value Ref Range Status   Specimen Description BLOOD LEFT HAND  Final   Special Requests   Final    BOTTLES DRAWN AEROBIC AND ANAEROBIC Blood Culture adequate volume   Culture   Final    NO GROWTH < 24 HOURS Performed at [facility], [place] [address] [place], [place] [address]    Report Status PENDING  Incomplete  Culture, blood (Routine X 2) w Reflex to ID Panel     Status: None (Preliminary result)   Collection Time:  08/19/22  9:34 AM   Specimen: BLOOD LEFT HAND  Result Value Ref  Range Status   Specimen Description BLOOD LEFT HAND  Final   Special Requests   Final    BOTTLES DRAWN AEROBIC AND ANAEROBIC Blood Culture adequate volume   Culture   Final    NO GROWTH < 24 HOURS Performed at [facility], [address] [place], [place] [address]    Report Status PENDING  Incomplete    RADIOLOGY STUDIES/RESULTS: DG Chest Port 1 View  Result Date: 08/20/2022 CLINICAL DATA:  Shortness of breath EXAM: PORTABLE CHEST 1 VIEW COMPARISON:  Chest x-ray dated August 19, 2018 FINDINGS: Unchanged cardiomegaly. Elevation of the right hemidiaphragm. Moderate small bilateral pleural effusions and bibasilar atelectasis. Unchanged right lower lobe consolidation. No evidence of pneumothorax. IMPRESSION: 1. Unchanged right lower lobe consolidation, concerning for infection or aspiration. 2. Small bilateral pleural effusions. Electronically Signed   By: Yetta Glassman M.D.   On: 08/20/2022 08:30   DG Chest Port 1V same Day  Result Date: 08/19/2022 CLINICAL DATA:  Shortness of breath. EXAM: PORTABLE CHEST 1 VIEW COMPARISON:  08/19/2022 FINDINGS: The cardio pericardial silhouette is enlarged. Diffuse interstitial opacity is similar to prior. Stable volume loss right hemithorax with right base collapse/consolidation and small to moderate right pleural effusion. Small left pleural effusion again noted. Bones are diffusely demineralized. Telemetry leads overlie the chest. IMPRESSION: No substantial interval change in exam. Right base collapse/consolidation with small to moderate right pleural effusion and small left effusion. Electronically Signed   By: Misty Stanley M.D.   On: 08/19/2022 09:48   DG Abd 2 Views  Result Date: 08/19/2022 CLINICAL DATA:  Vomiting EXAM: ABDOMEN - 2 VIEW COMPARISON:  08/16/2022 FINDINGS: Scattered large and small bowel gas is noted. No obstructive changes are seen. No free air is noted no acute  bony abnormality is seen. IMPRESSION: No acute abnormality noted. Electronically Signed   By: Inez Catalina M.D.   On: 08/19/2022 02:33     LOS: 3 days   Oren Binet, MD  [name]    To contact the attending provider between 7A-7P or the covering provider during after hours 7P-7A, please log into the web site [email or web address] and access using universal [place] password for that web site. If you do not have the password, please call the hospital operator.  08/20/2022, 10:54 AM

## 2022-08-20 NOTE — Progress Notes (Signed)
Patient ID: [name], male   DOB: 08/13/1945, 77 y.o.   MRN: [id number]    Progress Note   Subjective   Day # 3 CC; nausea vomiting and coffee-ground emesis, duration pneumonia hypoxic respiratory failure  Eliquis on hold End-stage renal disease on peritoneal dialysis  Sleeping, unable to arouse this morning enough to have conversation Spoke with patient's nurse, no report of any movements or melena overnight, no further vomiting or coffee-ground emesis, he has been coughing up some dark brown material  Chest x-ray today-unchanged right lower lobe consolidation concerning for infection/aspiration, small bilateral effusions  Labs-WBC 10.8/hemoglobin 8.4/hematocrit 25.7-down from 8.8 yesterday BUN 52/creatinine 8.24   Objective   Vital signs in last 24 hours: Temp:  [97.9 F (36.6 C)-98.7 F (37.1 C)] 98.4 F (36.9 C) (03/29 0400) Pulse Rate:  [56-65] 60 (03/28 2300) Resp:  [16-24] 24 (03/28 2300) BP: (106-157)/(49-64) 139/53 (03/28 2300) SpO2:  [90 %-100 %] 90 % (03/28 2300) Weight:  [68.6 kg-71.2 kg] 71.2 kg (03/29 0300) Last BM Date : 08/15/22 General:    Elderly white male in NAD, somnolent, on O2 3 L Heart:  Regular rate and rhythm; no murmurs Lungs: Creased breath sounds bilateral bases, few rhonchi right lower Abdomen:  Soft, full feeling, nontender normal bowel sounds. Extremities:  Without edema. Neurologic:  Alert and oriented,  grossly normal neurologically. Psych:  Cooperative. Normal mood and affect.  Intake/Output from previous day: 03/28 0701 - 03/29 0700 In: -470.1 [I.V.:27.8; IV Piggyback:250.1] Out: -  Intake/Output this shift: No intake/output data recorded.  Lab Results: Recent Labs    08/18/22 2140 08/19/22 0929 08/19/22 1304 08/19/22 1855 08/20/22 0041 08/20/22 0639  WBC 19.1* 16.0*  --   --   --  10.8*  HGB 9.8* 9.1*   < > 8.8* 8.4* 8.4*  HCT 29.0* 27.3*   < > 26.6* 25.8* 25.7*  PLT 165 170  --   --   --  156   < > = values in  this interval not displayed.   BMET Recent Labs    08/18/22 2140 08/19/22 0929 08/20/22 0041  NA 137 136 135  K 4.7 4.9 4.8  CL 102 103 101  CO2 24 22 22   GLUCOSE 203* 72 77  BUN 49* 50* 52*  CREATININE 8.09* 8.18* 8.24*  CALCIUM 8.1* 7.5* 7.6*   LFT Recent Labs    08/18/22 2140 08/19/22 0929 08/20/22 0041  PROT 4.9*  --   --   ALBUMIN 1.9*   < > <1.5*  AST 13*  --   --   ALT 13  --   --   ALKPHOS 122  --   --   BILITOT 0.6  --   --    < > = values in this interval not displayed.   PT/INR No results for input(s): "LABPROT", "INR" in the last 72 hours.  Studies/Results: DG Chest Port 1 View  Result Date: 08/20/2022 CLINICAL DATA:  Shortness of breath EXAM: PORTABLE CHEST 1 VIEW COMPARISON:  Chest x-ray dated August 19, 2018 FINDINGS: Unchanged cardiomegaly. Elevation of the right hemidiaphragm. Moderate small bilateral pleural effusions and bibasilar atelectasis. Unchanged right lower lobe consolidation. No evidence of pneumothorax. IMPRESSION: 1. Unchanged right lower lobe consolidation, concerning for infection or aspiration. 2. Small bilateral pleural effusions. Electronically Signed   By: Yetta Glassman M.D.   On: 08/20/2022 08:30   DG Chest Port 1V same Day  Result Date: 08/19/2022 CLINICAL DATA:  Shortness of breath. EXAM: PORTABLE  CHEST 1 VIEW COMPARISON:  08/19/2022 FINDINGS: The cardio pericardial silhouette is enlarged. Diffuse interstitial opacity is similar to prior. Stable volume loss right hemithorax with right base collapse/consolidation and small to moderate right pleural effusion. Small left pleural effusion again noted. Bones are diffusely demineralized. Telemetry leads overlie the chest. IMPRESSION: No substantial interval change in exam. Right base collapse/consolidation with small to moderate right pleural effusion and small left effusion. Electronically Signed   By: Misty Stanley M.D.   On: 08/19/2022 09:48   DG Abd 2 Views  Result Date:  08/19/2022 CLINICAL DATA:  Vomiting EXAM: ABDOMEN - 2 VIEW COMPARISON:  08/16/2022 FINDINGS: Scattered large and small bowel gas is noted. No obstructive changes are seen. No free air is noted no acute bony abnormality is seen. IMPRESSION: No acute abnormality noted. Electronically Signed   By: Inez Catalina M.D.   On: 08/19/2022 02:33       Assessment / Plan:    #79 77 year old white male it had after a mechanical fall with left seventh and eighth rib fractures displaced and possible 12th rib fracture Ongoing pain and pleuritic pain  #2 end-stage renal disease on peritoneal dialysis #3 multiple episodes of nausea and vomiting on evening of 08/18/2022 eventually becoming coffee-ground material then developed respiratory insufficiency felt secondary to aspiration event Has not had any further active bleeding over the past 24 hours had mild drift in hemoglobin Etiology of coffee-ground emesis is not clear he may have had a retch gastropathy, acute esophagitis or small tear secondary to multiple episodes of vomiting  #4 Right lower lobe pneumonia secondary to aspiration Acute hypoxic respiratory failure secondary to above On Unasyn  #5 cardiomyopathy with EF 30 to 35% #6 prior CVA TIA #7 history of atrial fibs/flutter-on chronic Eliquis now on hold #8  8 diabetes mellitus #9 peripheral arterial disease  Plan; continue IV PPI twice daily Full liquid diet as tolerates Continue to trend hemoglobin As he is not actively bleeding and is requiring oxygen for his aspiration pneumonia we will hold on EGD today.  Will continue to follow and determine timing of EGD over the next day or 2.     Principal Problem:   Flail chest Active Problems:   Anemia   Coffee ground emesis   Anticoagulated   Aspiration pneumonia ([place])     LOS: 3 days   Sarai January EsterwoodPA-C  08/20/2022, 10:29 AM

## 2022-08-20 NOTE — Progress Notes (Signed)
Patient ID: [name], male   DOB: [birth year], 77 y.o.   MRN: [id number] [facility] Progress Note   Assessment/ Plan:   1.  Mechanical fall with fracture of the left seventh/eighth and possibly 12th ribs: Originally transferred for suspicion of flail chest however this was ruled out by trauma surgery.  Patient he continues to have problems with pleuritic pain and PD volume intolerance. 2. ESRD: Continue peritoneal dialysis with low volume fill given intolerance to larger amounts due to chest pain over site of rib fractures. 3. Anemia: Low hemoglobin/hematocrit, concerns raised regarding Mallory-Weiss tear versus upper GI bleed with plans noted for EGD. 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  Will adjust sevelamer dose. 5. Nutrition: Continue renal diet with fluid restriction/phosphorus restriction. 6.  Acute hypoxic respiratory failure: Suspected to be likely secondary to aspiration pneumonia in the setting of recurrent emesis/intolerance to PD fill volume.  Started on intravenous Unasyn with improvement of leukocytosis noted overnight.  Subjective:   Reports to be feeling fatigued and having some intermittent chest wall pain over the left chest.   Objective:   BP (!) 139/53 (BP Location: Right Arm)   Pulse 60   Temp 98.4 F (36.9 C) (Oral)   Resp (!) 24   Wt 71.2 kg   SpO2 90%   BMI 25.34 kg/m   Physical Exam: Gen: Appears fatigued resting in bed, awakens to conversation CVS: Pulse regular rhythm, normal rate, S1 and S2 normal Resp: Poor inspiratory effort with decreased breath sounds over bases, no rales/rhonchi Abd: Soft, slightly distended, bowel sounds normal Ext: No lower extremity edema  Labs: BMET Recent Labs  Lab 08/16/22 2116 08/17/22 0436 08/18/22 2140 08/19/22 0929 08/20/22 0041  NA 134* 138 137 136 135  K 5.0 5.1 4.7 4.9 4.8  CL 101 110 102 103 101  CO2 24 23 24 22 22    GLUCOSE 127* 96 203* 72 77  BUN 48* 48* 49* 50* 52*  CREATININE 7.41* 7.42* 8.09* 8.18* 8.24*  CALCIUM 8.0* 7.8* 8.1* 7.5* 7.6*  PHOS  --  6.6*  --  6.5* 6.8*   CBC Recent Labs  Lab 08/16/22 2116 08/17/22 0436 08/18/22 2140 08/19/22 0929 08/19/22 1304 08/19/22 1855 08/20/22 0041 08/20/22 0639  WBC 12.9* 11.4* 19.1* 16.0*  --   --   --  10.8*  NEUTROABS 10.2*  --  17.3*  --   --   --   --   --   HGB 9.9* 9.1* 9.8* 9.1* 8.8* 8.8* 8.4* 8.4*  HCT 30.2* 28.4* 29.0* 27.3* 26.4* 26.6* 25.8* 25.7*  MCV 88.3 87.7 86.6 87.8  --   --   --  88.0  PLT 165 148* 165 170  --   --   --  156      Medications:     amiodarone  200 mg Oral Daily   amLODipine  5 mg Oral Daily   bisoprolol  5 mg Oral BID   Chlorhexidine Gluconate Cloth  6 each Topical Daily   cloNIDine  0.1 mg Transdermal Q Fri   docusate sodium  100 mg Oral BID   doxazosin  8 mg Oral Daily   gentamicin cream  1 Application Topical Daily   guaiFENesin  600 mg Oral BID   lidocaine  1 patch Transdermal QHS   losartan  50 mg Oral Daily   nicotine  21 mg Transdermal Daily   [START ON 08/22/2022] pantoprazole  40  mg Intravenous Q12H   polyethylene glycol  17 g Oral BID   scopolamine  1 patch Transdermal Q72H   sevelamer carbonate  800 mg Oral TID WC   Elmarie Shiley, MD 08/20/2022, 9:35 AM

## 2022-08-21 ENCOUNTER — Inpatient Hospital Stay (HOSPITAL_COMMUNITY): Payer: Medicare PPO

## 2022-08-21 DIAGNOSIS — S2242XD Multiple fractures of ribs, left side, subsequent encounter for fracture with routine healing: Secondary | ICD-10-CM | POA: Diagnosis not present

## 2022-08-21 DIAGNOSIS — N186 End stage renal disease: Secondary | ICD-10-CM | POA: Diagnosis not present

## 2022-08-21 DIAGNOSIS — I10 Essential (primary) hypertension: Secondary | ICD-10-CM | POA: Diagnosis not present

## 2022-08-21 DIAGNOSIS — I2510 Atherosclerotic heart disease of native coronary artery without angina pectoris: Secondary | ICD-10-CM | POA: Diagnosis not present

## 2022-08-21 LAB — CBC
HCT: 27.7 % — ABNORMAL LOW (ref 39.0–52.0)
Hemoglobin: 8.9 g/dL — ABNORMAL LOW (ref 13.0–17.0)
MCH: 28.4 pg (ref 26.0–34.0)
MCHC: 32.1 g/dL (ref 30.0–36.0)
MCV: 88.5 fL (ref 80.0–100.0)
Platelets: 196 10*3/uL (ref 150–400)
RBC: 3.13 MIL/uL — ABNORMAL LOW (ref 4.22–5.81)
RDW: 15.8 % — ABNORMAL HIGH (ref 11.5–15.5)
WBC: 9.5 10*3/uL (ref 4.0–10.5)
nRBC: 0 % (ref 0.0–0.2)

## 2022-08-21 LAB — RENAL FUNCTION PANEL
Albumin: <1.5 g/dL — ABNORMAL LOW (ref 3.5–5.0)
Anion gap: 13 (ref 5–15)
BUN: 70 mg/dL — ABNORMAL HIGH (ref 8–23)
CO2: 23 mmol/L (ref 22–32)
Calcium: 7.9 mg/dL — ABNORMAL LOW (ref 8.9–10.3)
Chloride: 99 mmol/L (ref 98–111)
Creatinine, Ser: 8.79 mg/dL — ABNORMAL HIGH (ref 0.61–1.24)
GFR, Estimated: 6 mL/min — ABNORMAL LOW (ref >60)
Glucose, Bld: 60 mg/dL — ABNORMAL LOW (ref 70–99)
Phosphorus: 7.3 mg/dL — ABNORMAL HIGH (ref 2.5–4.6)
Potassium: 4.9 mmol/L (ref 3.5–5.1)
Sodium: 135 mmol/L (ref 135–145)

## 2022-08-21 MED ORDER — DELFLEX-LC/4.25% DEXTROSE 483 MOSM/L IP SOLN: INTRAPERITONEAL | Status: DC

## 2022-08-21 MED ORDER — DELFLEX-LC/2.5% DEXTROSE 394 MOSM/L IP SOLN: INTRAPERITONEAL | Status: DC

## 2022-08-21 MED ORDER — FUROSEMIDE 10 MG/ML IJ SOLN
160.0000 mg | Freq: Once | INTRAVENOUS | Status: AC
  Administered 2022-08-21: 160 mg via INTRAVENOUS
  Filled 2022-08-21: qty 10

## 2022-08-21 MED ORDER — SEVELAMER CARBONATE 800 MG PO TABS
2400.0000 mg | ORAL_TABLET | Freq: Three times a day (TID) | ORAL | Status: DC
  Administered 2022-08-21: 2400 mg via ORAL
  Filled 2022-08-21 (×3): qty 3

## 2022-08-21 NOTE — Progress Notes (Signed)
PROGRESS NOTE        PATIENT DETAILS Name: [name] Age: 77 y.o. Sex: male Date of Birth: 12-Jan-1946 Admit Date: 08/17/2022 Admitting Physician Emilee Hero, MD [id number], Aris Everts, MD  Brief Summary: Patient is a 77 y.o.  male with history of ESRD on peritoneal dialysis, A-fib on Eliquis, HTN, HLD-who was transferred from [facility] for concern for a flail chest following a fall with rib fracture.  Significant events: 3/25-3/26>> hospitalization at [facility] following a fall-concern for flail chest-transferred to [facility] at [facility] for trauma evaluation. 3/27>> numerous episodes of vomiting evening-into night-vomitus became coffee-ground-some intermittent hypoxia.  Kept n.p.o.-PPI infusion started.  Significant studies: 3/25>> CT chest: Nondisplaced left seventh/eighth rib fracture, possible 12th left rib fracture. 3/25>> CT abdomen/pelvis: Extensive pneumoperitoneum-in the setting of PD catheter.  6.4 cm left cystic lesion-suggestive of a hemorrhagic cyst.  Significant microbiology data: 3/28>> blood cultures: No growth  Procedures: None  Consults: Nephrology Trauma surgery GI  Subjective: Pain issues are much better-controlled with just Tylenol.  Was on 6 L of oxygen overnight but titrated down to 3 L this morning.  No other issues  Objective: Vitals: Blood pressure (!) 137/54, pulse (!) 56, temperature 97.8 F (36.6 C), temperature source Oral, resp. rate (!) 22, weight 67.5 kg, SpO2 92 %.   Exam: Gen Exam:Alert awake-not in any distress HEENT:atraumatic, normocephalic Chest: B/L clear to auscultation anteriorly CVS:S1S2 regular Abdomen:soft non tender, non distended Extremities:no edema Neurology: Non focal Skin: no rash  Pertinent Labs/Radiology:    Latest Ref Rng & Units 08/21/2022    6:51 AM 08/20/2022    6:39 AM 08/20/2022   12:41 AM  CBC  WBC 4.0 - 10.5 K/uL 9.5  10.8    Hemoglobin 13.0 - 17.0 g/dL 8.9  8.4  8.4   Hematocrit 39.0 -  52.0 % 27.7  25.7  25.8   Platelets 150 - 400 K/uL 196  156      Lab Results  Component Value Date   NA 135 08/21/2022   K 4.9 08/21/2022   CL 99 08/21/2022   CO2 23 08/21/2022      Assessment/Plan: Mechanical fall with left seventh, eighth and possible 12th rib fracture Pain is much better-which is scheduled Tylenol Continue to encourage incentive spirometry/mobilization Minimize narcotics as much as possible   Intractable nausea/vomiting Upper GI bleed with coffee-ground emesis-?  Mallory-Weiss tear No further vomiting-GI bleeding for almost 48 hours Hemoglobin stable-some drop from baseline Remains on PPI GI following for possible endoscopic evaluation Eliquis remains on hold  Acute hypoxic respiratory failure Initially felt to be due to rib fracture/splinting/atelectasis-suspect may have aspirated overnight on 3/27 leading to more worsening of hypoxemia Stable on 3 L of oxygen this morning-reviewed chest x-ray-may be developing some pulmonary edema as well-his dialysis fill regimen was decreased due to severe pain a couple of days ago. Nephrology adjusting his PD regimen-discussed with nephrologist-Dr. Phil Dopp of high-dose IV Lasix-160 mg x 1 dose today to see if this helps with his hypoxemia-as he still makes some urine.  Aspiration pneumonia Much improved Leukocytosis. Cultures negative so far IV Unasyn x 5 days total.    Left renal hemorrhagic cyst Incidental finding on CT abdomen Eliquis remains on hold  ESRD on peritoneal dialysis Nephrology following  Acute urinary retention  Occurred on 3/28 when patient had vomiting-extensive pain issues Getting high-dose IV Lasix  today-will attempt voiding trial tomorrow.  PAF Sinus rhythm on telemetry Continue amiodarone Eliquis on hold-due to possible hemorrhagic cyst-and now due to possible upper GI bleeding with Mallory-Weiss tear  Chronic HFrEF Volume status stable Volume removal with PD-concern for some  pulmonary edema PD . CAD (LHC 06/30/2022-two-vessel disease-70-80% mid LAD stenosis, chronic total occlusion of RCA with left-right collaterals-optimization of medical therapy recommended) Not on antiplatelets as he is on Eliquis Intolerant to statin Currently without any anginal symptoms-his left-sided musculoskeletal chest pain is from rib fractures.  HTN BP reasonable Continue amlodipine/bisoprolol/clonidine/doxazosin and losartan.    BMI: Estimated body mass index is 24.02 kg/m as calculated from the following:   Height as of an earlier encounter on 08/17/22: 5\' 6"  (1.676 m).   Weight as of this encounter: 67.5 kg.   Code status:   Code Status: Full Code   DVT Prophylaxis: Place TED hose Start: 08/17/22 2148   Family Communication: Zisha Montjoy [id number] 3/29   Disposition Plan: Status is: Inpatient Remains inpatient appropriate because: Severity of illness   Planned Discharge Destination:Home health   Diet: Diet Order             Diet NPO time specified Except for: Sips with Meds  Diet effective midnight                     Antimicrobial agents: Anti-infectives (From admission, onward)    Start     Dose/Rate Route Frequency Ordered Stop   08/19/22 1100  Ampicillin-Sulbactam (UNASYN) 3 g in sodium chloride 0.9 % 100 mL IVPB        3 g 200 mL/hr over 30 Minutes Intravenous Every 24 hours 08/19/22 0957          MEDICATIONS: Scheduled Meds:  acetaminophen  1,000 mg Oral Q8H   amiodarone  200 mg Oral Daily   amLODipine  5 mg Oral Daily   bisoprolol  5 mg Oral BID   Chlorhexidine Gluconate Cloth  6 each Topical Daily   cloNIDine  0.1 mg Transdermal Q Fri   docusate sodium  100 mg Oral BID   doxazosin  8 mg Oral Daily   gentamicin cream  1 Application Topical Daily   guaiFENesin  600 mg Oral BID   lidocaine  1 patch Transdermal QHS   losartan  50 mg Oral Daily   nicotine  21 mg Transdermal Daily   [START ON 08/22/2022] pantoprazole  40  mg Intravenous Q12H   polyethylene glycol  17 g Oral BID   scopolamine  1 patch Transdermal Q72H   sevelamer carbonate  2,400 mg Oral TID WC   Continuous Infusions:  ampicillin-sulbactam (UNASYN) IV 3 g (08/21/22 0956)   dialysis solution 2.5% low-MG/low-CA     pantoprazole 8 mg/hr (08/20/22 2257)   promethazine (PHENERGAN) injection (IM or IVPB) Stopped (08/18/22 1826)   promethazine (PHENERGAN) injection (IM or IVPB)     PRN Meds:.lactulose, metoCLOPramide (REGLAN) injection, naLOXone (NARCAN)  injection, ondansetron **OR** ondansetron (ZOFRAN) IV, oxyCODONE, promethazine **OR** promethazine (PHENERGAN) injection (IM or IVPB) **OR** promethazine, promethazine (PHENERGAN) injection (IM or IVPB)   I have personally reviewed following labs and imaging studies  LABORATORY DATA: CBC: Recent Labs  Lab 08/16/22 2116 08/17/22 0436 08/18/22 2140 08/19/22 0929 08/19/22 1304 08/19/22 1855 08/20/22 0041 08/20/22 0639 08/21/22 0651  WBC 12.9* 11.4* 19.1* 16.0*  --   --   --  10.8* 9.5  NEUTROABS 10.2*  --  17.3*  --   --   --   --   --   --  HGB 9.9* 9.1* 9.8* 9.1* 8.8* 8.8* 8.4* 8.4* 8.9*  HCT 30.2* 28.4* 29.0* 27.3* 26.4* 26.6* 25.8* 25.7* 27.7*  MCV 88.3 87.7 86.6 87.8  --   --   --  88.0 88.5  PLT 165 148* 165 170  --   --   --  156 196     Basic Metabolic Panel: Recent Labs  Lab 08/17/22 0436 08/18/22 2140 08/19/22 0929 08/20/22 0041 08/21/22 0651  NA 138 137 136 135 135  K 5.1 4.7 4.9 4.8 4.9  CL 110 102 103 101 99  CO2 23 24 22 22 23   GLUCOSE 96 203* 72 77 60*  BUN 48* 49* 50* 52* 70*  CREATININE 7.42* 8.09* 8.18* 8.24* 8.79*  CALCIUM 7.8* 8.1* 7.5* 7.6* 7.9*  PHOS 6.6*  --  6.5* 6.8* 7.3*     GFR: Estimated Creatinine Clearance: 6.5 mL/min (A) (by C-G formula based on SCr of 8.79 mg/dL (H)).  Liver Function Tests: Recent Labs  Lab 08/16/22 2116 08/18/22 2140 08/19/22 0929 08/20/22 0041 08/21/22 0651  AST 12* 13*  --   --   --   ALT 12 13  --   --    --   ALKPHOS 107 122  --   --   --   BILITOT 0.5 0.6  --   --   --   PROT 5.2* 4.9*  --   --   --   ALBUMIN 2.1* 1.9* 1.7* <1.5* <1.5*    No results for input(s): "LIPASE", "AMYLASE" in the last 168 hours. No results for input(s): "AMMONIA" in the last 168 hours.  Coagulation Profile: Recent Labs  Lab 08/17/22 0436  INR 1.2     Cardiac Enzymes: No results for input(s): "CKTOTAL", "CKMB", "CKMBINDEX", "TROPONINI" in the last 168 hours.  BNP (last 3 results) No results for input(s): "PROBNP" in the last 8760 hours.  Lipid Profile: No results for input(s): "CHOL", "HDL", "LDLCALC", "TRIG", "CHOLHDL", "LDLDIRECT" in the last 72 hours.  Thyroid Function Tests: No results for input(s): "TSH", "T4TOTAL", "FREET4", "T3FREE", "THYROIDAB" in the last 72 hours.  Anemia Panel: No results for input(s): "VITAMINB12", "FOLATE", "FERRITIN", "TIBC", "IRON", "RETICCTPCT" in the last 72 hours.  Urine analysis:    Component Value Date/Time   COLORURINE STRAW (A) 11/28/2020 1055   APPEARANCEUR CLEAR (A) 11/28/2020 1055   APPEARANCEUR Clear 01/03/2012 1117   LABSPEC 1.008 11/28/2020 1055   LABSPEC 1.004 01/03/2012 1117   PHURINE 7.0 11/28/2020 1055   GLUCOSEU 50 (A) 11/28/2020 1055   GLUCOSEU 100 (A) 01/21/2020 1223   HGBUR NEGATIVE 11/28/2020 1055   BILIRUBINUR NEGATIVE 11/28/2020 1055   BILIRUBINUR neg 07/30/2013 1127   BILIRUBINUR Negative 01/03/2012 1117   KETONESUR NEGATIVE 11/28/2020 1055   PROTEINUR 30 (A) 11/28/2020 1055   UROBILINOGEN 0.2 01/21/2020 1223   NITRITE NEGATIVE 11/28/2020 1055   LEUKOCYTESUR NEGATIVE 11/28/2020 1055   LEUKOCYTESUR Negative 01/03/2012 1117    Sepsis Labs: Lactic Acid, Venous    Component Value Date/Time   LATICACIDVEN 0.8 08/19/2022 0929    MICROBIOLOGY: Recent Results (from the past 240 hour(s))  Culture, blood (Routine X 2) w Reflex to ID Panel     Status: None (Preliminary result)   Collection Time: 08/19/22  9:29 AM   Specimen:  BLOOD LEFT HAND  Result Value Ref Range Status   Specimen Description BLOOD LEFT HAND  Final   Special Requests   Final    BOTTLES DRAWN AEROBIC AND ANAEROBIC Blood Culture adequate volume  Culture   Final    NO GROWTH 2 DAYS Performed at [facility], [place] [address] [place], [place] [address]    Report Status PENDING  Incomplete  Culture, blood (Routine X 2) w Reflex to ID Panel     Status: None (Preliminary result)   Collection Time: 08/19/22  9:34 AM   Specimen: BLOOD LEFT HAND  Result Value Ref Range Status   Specimen Description BLOOD LEFT HAND  Final   Special Requests   Final    BOTTLES DRAWN AEROBIC AND ANAEROBIC Blood Culture adequate volume   Culture   Final    NO GROWTH 2 DAYS Performed at [facility], [place] [address], [place], [place] [address]    Report Status PENDING  Incomplete    RADIOLOGY STUDIES/RESULTS: DG Chest Port 1V same Day  Result Date: 08/21/2022 CLINICAL DATA:  Shortness of breath EXAM: PORTABLE CHEST 1 VIEW COMPARISON:  Yesterday FINDINGS: Cardiopericardial enlargement. Right more than left pleural effusion with underlying pulmonary opacity and interstitial thickening. No pneumothorax. IMPRESSION: Continued CHF pattern. Electronically Signed   By: Jorje Guild M.D.   On: 08/21/2022 08:06   DG Chest Port 1 View  Result Date: 08/20/2022 CLINICAL DATA:  Shortness of breath EXAM: PORTABLE CHEST 1 VIEW COMPARISON:  Chest x-ray dated August 19, 2018 FINDINGS: Unchanged cardiomegaly. Elevation of the right hemidiaphragm. Moderate small bilateral pleural effusions and bibasilar atelectasis. Unchanged right lower lobe consolidation. No evidence of pneumothorax. IMPRESSION: 1. Unchanged right lower lobe consolidation, concerning for infection or aspiration. 2. Small bilateral pleural effusions. Electronically Signed   By: Yetta Glassman M.D.   On: 08/20/2022 08:30     LOS: 4 days   Oren Binet, MD  [name]    To contact the  attending provider between 7A-7P or the covering provider during after hours 7P-7A, please log into the web site [email or web address] and access using universal [place] password for that web site. If you do not have the password, please call the hospital operator.  08/21/2022, 10:07 AM

## 2022-08-21 NOTE — Progress Notes (Signed)
Ambulated patient in the room and to the recliner chair, with the walker.

## 2022-08-21 NOTE — Progress Notes (Signed)
Pharmacy Antibiotic Note  [name] is a 77 y.o. male to begin Unasyn for aspiration pneumonia coverage. Today WBC are wnl, he is afebrile and he continued to receive peritoneal dialysis nightly.   Plan: Unasyn 3 gm IV q24h  Plan to complete 5 day course on 4/1 Will follow culture data, clinical progress and antibiotic plans.  Weight: 67.5 kg (148 lb 13 oz)  Temp (24hrs), Avg:97.9 F (36.6 C), Min:97.3 F (36.3 C), Max:99 F (37.2 C)  Recent Labs  Lab 08/16/22 2116 08/17/22 0059 08/17/22 0436 08/18/22 2140 08/19/22 0929 08/20/22 0041 08/20/22 0639 08/21/22 0651  WBC 12.9*  --  11.4* 19.1* 16.0*  --  10.8* 9.5  CREATININE 7.41*  --  7.42* 8.09* 8.18* 8.24*  --  8.79*  LATICACIDVEN 0.5 0.6  --   --  0.8  --   --   --      ESRD - on CCPD  Allergies  Allergen Reactions   Irbesartan Other (See Comments)    hyperkalemia   Cymbalta [Duloxetine Hcl]    Hydralazine Other (See Comments)    headache   Imdur [Isosorbide Nitrate] Other (See Comments)    headache   Rosuvastatin     Breast swelling/soreness   Atorvastatin Other (See Comments)    Muscle pain   Bystolic [Nebivolol Hcl]     Extreme fatigue     Antimicrobials this admission: Unasyn 3/28>> (4/1) Gentamicin cream to PD exit site daily 3/27>>  Dose adjustments this admission: n/a  Microbiology results: 3/28 blood: ng x 2d  Thank you for allowing pharmacy to be a part of this patient's care.  Titus Dubin, PharmD PGY1 Pharmacy Resident 08/21/2022 10:16 AM

## 2022-08-21 NOTE — Progress Notes (Signed)
Patient ID: [name], male   DOB: October 25, 1945, 77 y.o.   MRN: [id number] [facility] Progress Note   Assessment/ Plan:   1.  Mechanical fall with fracture of the left seventh/eighth and possibly 12th ribs: Originally transferred for suspicion of flail chest however this was ruled out by trauma surgery.  Ongoing efforts at symptom management. 2. ESRD: Continue peritoneal dialysis with slight increase of his fill volume to 1.2 L (to help improve clearance with rising BUN/creatinine otherwise, this is likely to affect his mental status negatively). 3. Anemia: Currently with stable hemoglobin/hematocrit after having coffee-ground emesis earlier this week, concerns raised regarding Mallory-Weiss tear versus upper GI bleed with plans noted for EGD. 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  Sevelamer increased to 2400 mg 3 times daily AC. 5. Nutrition: Continue renal diet with fluid restriction/phosphorus restriction. 6.  Acute hypoxic respiratory failure: Suspected to be likely secondary to aspiration pneumonia in the setting of recurrent emesis/intolerance to PD fill volume.  Started on intravenous Unasyn with improvement of leukocytosis but continued dependency on oxygen supplementation/hypoxia noted.  May need repeat CXR.  Subjective:   Reports to be feeling fatigued and having some intermittent chest wall pain over the left chest.   Objective:   BP (!) 137/54   Pulse (!) 56   Temp 97.8 F (36.6 C) (Oral)   Resp (!) 22   Wt 67.5 kg   SpO2 92%   BMI 24.02 kg/m   Physical Exam: Gen: Somnolent/fatigued resting in bed, on oxygen via nasal cannula. CVS: Pulse regular rhythm, normal rate, S1 and S2 normal Resp: Poor inspiratory effort with decreased breath sounds over bases, no rales/rhonchi Abd: Soft, slightly distended, bowel sounds normal Ext: No lower extremity edema  Labs: BMET Recent Labs  Lab  08/16/22 2116 08/17/22 0436 08/18/22 2140 08/19/22 0929 08/20/22 0041 08/21/22 0651  NA 134* 138 137 136 135 135  K 5.0 5.1 4.7 4.9 4.8 4.9  CL 101 110 102 103 101 99  CO2 24 23 24 22 22 23   GLUCOSE 127* 96 203* 72 77 60*  BUN 48* 48* 49* 50* 52* 70*  CREATININE 7.41* 7.42* 8.09* 8.18* 8.24* 8.79*  CALCIUM 8.0* 7.8* 8.1* 7.5* 7.6* 7.9*  PHOS  --  6.6*  --  6.5* 6.8* 7.3*   CBC Recent Labs  Lab 08/16/22 2116 08/17/22 0436 08/18/22 2140 08/19/22 0929 08/19/22 1304 08/19/22 1855 08/20/22 0041 08/20/22 0639 08/21/22 0651  WBC 12.9*   < > 19.1* 16.0*  --   --   --  10.8* 9.5  NEUTROABS 10.2*  --  17.3*  --   --   --   --   --   --   HGB 9.9*   < > 9.8* 9.1*   < > 8.8* 8.4* 8.4* 8.9*  HCT 30.2*   < > 29.0* 27.3*   < > 26.6* 25.8* 25.7* 27.7*  MCV 88.3   < > 86.6 87.8  --   --   --  88.0 88.5  PLT 165   < > 165 170  --   --   --  156 196   < > = values in this interval not displayed.      Medications:     acetaminophen  1,000 mg Oral Q8H   amiodarone  200 mg Oral Daily   amLODipine  5 mg Oral Daily   bisoprolol  5 mg Oral BID  Chlorhexidine Gluconate Cloth  6 each Topical Daily   cloNIDine  0.1 mg Transdermal Q Fri   docusate sodium  100 mg Oral BID   doxazosin  8 mg Oral Daily   gentamicin cream  1 Application Topical Daily   guaiFENesin  600 mg Oral BID   lidocaine  1 patch Transdermal QHS   losartan  50 mg Oral Daily   nicotine  21 mg Transdermal Daily   [START ON 08/22/2022] pantoprazole  40 mg Intravenous Q12H   polyethylene glycol  17 g Oral BID   scopolamine  1 patch Transdermal Q72H   sevelamer carbonate  800 mg Oral TID WC   Elmarie Shiley, MD 08/21/2022, 9:52 AM

## 2022-08-21 NOTE — Progress Notes (Signed)
PD post treatment note  PD treatment completed. Patient tolerated treatment well. PD effluent is clear. No specimen collected.  PD exit site clean, dry and intact. Patient is awake, oriented and in no acute distress.  Report given to bedside nurse.   Post treatment VS: 127/66, 54, 14, 95%  Total UF removed:  511 mls  Post treatment weight: 67.5 kg     08/21/22 0258  Peritoneal Catheter Left lower abdomen Continuous ambulatory  Placement Date/Time: 03/01/19 1310   Procedural Verification: Medical records & consent reviewed;Relevant studies,results and images reviewed  Time out: Correct Patient;Correct Site;Correct Procedure;Special equipment/requirements available  Person In...  Site Assessment Clean, Dry, Intact  Drainage Description None  Catheter status Deaccessed  Dressing Gauze/Drain sponge  Dressing Status Clean, Dry, Intact  Dressing Intervention Dressing reinforced  Completion  Treatment Status Complete  Initial Drain Volume 14  Average Dwell Time-Hour(s) 1  Average Dwell Time-Min(s) 30  Average Drain Time 38  Total Therapy Volume 4001  Total Therapy Time-Hour(s) 8  Total Therapy Time-Min(s) 54  Weight after Drain 148 lb 13 oz (67.5 kg)  Effluent Appearance Clear  Cell Count on Daytime Exchange N/A  Fluid Balance - CCPD  Total UF (- value on cycler, pt gain) -511 mL  Procedure Comments  Tolerated treatment well? Yes  Peritoneal Dialysis Comments PD tx completed as expected, tolerated well  Education / Care Plan  Dialysis Education Provided Yes  Hand-off documentation  Hand-off Given Given to shift RN/LPN  Report given to (Full Name) Ivin Poot, RN  Hand-off Received Received from shift RN/LPN  Report received from (Full Name) Leota Sauers, RN

## 2022-08-21 NOTE — Progress Notes (Signed)
Patients oxygen saturation decreased to 86% on 3L [place]. RN increased O2 to 4L with little effect. RN notified Dr. [name] and RT. RT increased O2 to 5L and pt is currently satting at 93%.

## 2022-08-21 NOTE — Progress Notes (Signed)
Chart reviewed today, patient not seen  Events overnight include increasing oxygen requirement with chest x-ray showing pleural effusion and pulmonary edema.  Previous concern was for aspiration pneumonia.  Hemoglobin is stable at 8.9.  Patient had coffee-ground emesis after nausea vomiting 3 days ago, 08/18/2022  Not yet appropriate for upper endoscopic evaluation for the coffee-ground emesis, but fortunately there is no evidence for active ongoing bleeding. Unclear if upper endoscopy will be appropriate during this hospitalization, time will tell.  GI will follow-up with the patient on Monday, but remain available over the weekend.  Please call with any questions or concerns.

## 2022-08-22 DIAGNOSIS — I2510 Atherosclerotic heart disease of native coronary artery without angina pectoris: Secondary | ICD-10-CM | POA: Diagnosis not present

## 2022-08-22 DIAGNOSIS — S2242XD Multiple fractures of ribs, left side, subsequent encounter for fracture with routine healing: Secondary | ICD-10-CM | POA: Diagnosis not present

## 2022-08-22 DIAGNOSIS — K920 Hematemesis: Secondary | ICD-10-CM | POA: Diagnosis not present

## 2022-08-22 DIAGNOSIS — N186 End stage renal disease: Secondary | ICD-10-CM | POA: Diagnosis not present

## 2022-08-22 DIAGNOSIS — J9691 Respiratory failure, unspecified with hypoxia: Secondary | ICD-10-CM | POA: Diagnosis not present

## 2022-08-22 DIAGNOSIS — I10 Essential (primary) hypertension: Secondary | ICD-10-CM | POA: Diagnosis not present

## 2022-08-22 DIAGNOSIS — R112 Nausea with vomiting, unspecified: Secondary | ICD-10-CM | POA: Diagnosis not present

## 2022-08-22 DIAGNOSIS — E1129 Type 2 diabetes mellitus with other diabetic kidney complication: Secondary | ICD-10-CM | POA: Diagnosis not present

## 2022-08-22 LAB — RENAL FUNCTION PANEL
Albumin: <1.5 g/dL — ABNORMAL LOW (ref 3.5–5.0)
Anion gap: 14 (ref 5–15)
BUN: 75 mg/dL — ABNORMAL HIGH (ref 8–23)
CO2: 21 mmol/L — ABNORMAL LOW (ref 22–32)
Calcium: 7.9 mg/dL — ABNORMAL LOW (ref 8.9–10.3)
Chloride: 100 mmol/L (ref 98–111)
Creatinine, Ser: 8.73 mg/dL — ABNORMAL HIGH (ref 0.61–1.24)
GFR, Estimated: 6 mL/min — ABNORMAL LOW (ref >60)
Glucose, Bld: 92 mg/dL (ref 70–99)
Phosphorus: 6.9 mg/dL — ABNORMAL HIGH (ref 2.5–4.6)
Potassium: 5.2 mmol/L — ABNORMAL HIGH (ref 3.5–5.1)
Sodium: 135 mmol/L (ref 135–145)

## 2022-08-22 LAB — CBC
HCT: 28.1 % — ABNORMAL LOW (ref 39.0–52.0)
Hemoglobin: 9.3 g/dL — ABNORMAL LOW (ref 13.0–17.0)
MCH: 28.9 pg (ref 26.0–34.0)
MCHC: 33.1 g/dL (ref 30.0–36.0)
MCV: 87.3 fL (ref 80.0–100.0)
Platelets: 204 10*3/uL (ref 150–400)
RBC: 3.22 MIL/uL — ABNORMAL LOW (ref 4.22–5.81)
RDW: 15.9 % — ABNORMAL HIGH (ref 11.5–15.5)
WBC: 11.5 10*3/uL — ABNORMAL HIGH (ref 4.0–10.5)
nRBC: 0.4 % — ABNORMAL HIGH (ref 0.0–0.2)

## 2022-08-22 MED ORDER — FERRIC CITRATE 1 GM 210 MG(FE) PO TABS
420.0000 mg | ORAL_TABLET | Freq: Three times a day (TID) | ORAL | Status: DC
  Administered 2022-08-22 – 2022-08-30 (×12): 420 mg via ORAL
  Filled 2022-08-22 (×12): qty 2

## 2022-08-22 MED ORDER — DELFLEX-LC/4.25% DEXTROSE 483 MOSM/L IP SOLN: INTRAPERITONEAL | Status: DC

## 2022-08-22 MED ORDER — SODIUM ZIRCONIUM CYCLOSILICATE 5 G PO PACK
5.0000 g | PACK | Freq: Once | ORAL | Status: AC
  Administered 2022-08-22: 5 g via ORAL
  Filled 2022-08-22: qty 1

## 2022-08-22 MED ORDER — POLYETHYLENE GLYCOL 3350 17 G PO PACK
17.0000 g | PACK | Freq: Every day | ORAL | Status: DC | PRN
  Administered 2022-08-23: 17 g via ORAL
  Filled 2022-08-22: qty 1

## 2022-08-22 MED ORDER — ACETAMINOPHEN 500 MG PO TABS
1000.0000 mg | ORAL_TABLET | Freq: Three times a day (TID) | ORAL | Status: DC | PRN
  Administered 2022-08-29: 1000 mg via ORAL
  Filled 2022-08-22: qty 2

## 2022-08-22 MED ORDER — HEPARIN (PORCINE) 25000 UT/250ML-% IV SOLN
1400.0000 [IU]/h | INTRAVENOUS | Status: DC
  Administered 2022-08-22: 1000 [IU]/h via INTRAVENOUS
  Administered 2022-08-23: 1150 [IU]/h via INTRAVENOUS
  Administered 2022-08-24: 1400 [IU]/h via INTRAVENOUS
  Filled 2022-08-22 (×3): qty 250

## 2022-08-22 NOTE — Progress Notes (Signed)
    Progress Note   Assessment    77 year old male with ESRD on peritoneal dialysis, A-fib with Eliquis, hypertension, hyperlipidemia admitted with multiple rib fractures after a fall, previous coffee-ground emesis after nausea and vomiting   Recommendations   1.  Nausea vomiting/hematemesis --presumed Mallory-Weiss tear.  No further nausea or vomiting.  Hemoglobin slowly improving.  Given rib fractures, oxygen requirement and high likelihood of Mallory-Weiss tear now resolved, upper endoscopy not felt needed. -- Twice daily PPI for 1 month, once daily thereafter if needed -- No plans for upper endoscopy unless rebleeding -- Can resume Eliquis with close monitoring per primary team -- GI will sign off, call if questions  2.  Aspiration pneumonia/acute hypoxic respiratory failure --oxygen improving, on antibiotics.  White count improving.  Also diuresis ongoing.  3.  ESRD on PD    Chief Complaint   Patient resting but arousable Less chest pain but certainly present with movement No melena no further nausea or vomiting  Oxygen weaned down to 2 L this morning  Vital signs in last 24 hours: Temp:  [97.3 F (36.3 C)-97.8 F (36.6 C)] 97.6 F (36.4 C) (03/31 0800) Pulse Rate:  [44-55] 52 (03/31 0800) Resp:  [11-16] 13 (03/31 0400) BP: (102-141)/(37-58) 141/46 (03/31 0800) SpO2:  [87 %-98 %] 96 % (03/31 0800) Weight:  [67.8 kg-68.8 kg] 67.8 kg (03/31 0341) Last BM Date : 08/15/22  Gen: Asleep but arousable, no acute distress, chronically ill-appearing HEENT: anicteric  CV: RRR, no mrg Pulm: CTA b/l anteriorly Abd: soft, NT/ND, +BS throughout Ext: no c/c/e Neuro: nonfocal  Intake/Output from previous day: 03/30 0701 - 03/31 0700 In: -  Out: 556 [Urine:500] Intake/Output this shift: No intake/output data recorded.  Lab Results: Recent Labs    08/20/22 0639 08/21/22 0651 08/22/22 0825  WBC 10.8* 9.5 11.5*  HGB 8.4* 8.9* 9.3*  HCT 25.7* 27.7* 28.1*  PLT 156 196  204   BMET Recent Labs    08/20/22 0041 08/21/22 0651 08/22/22 0825  NA 135 135 135  K 4.8 4.9 5.2*  CL 101 99 100  CO2 22 23 21*  GLUCOSE 77 60* 92  BUN 52* 70* 75*  CREATININE 8.24* 8.79* 8.73*  CALCIUM 7.6* 7.9* 7.9*   LFT Recent Labs    08/22/22 0825  ALBUMIN <1.5*   PT/INR No results for input(s): "LABPROT", "INR" in the last 72 hours. Hepatitis Panel No results for input(s): "HEPBSAG", "HCVAB", "HEPAIGM", "HEPBIGM" in the last 72 hours.  Studies/Results: DG Chest Port 1V same Day  Result Date: 08/21/2022 CLINICAL DATA:  Shortness of breath EXAM: PORTABLE CHEST 1 VIEW COMPARISON:  Yesterday FINDINGS: Cardiopericardial enlargement. Right more than left pleural effusion with underlying pulmonary opacity and interstitial thickening. No pneumothorax. IMPRESSION: Continued CHF pattern. Electronically Signed   By: Jorje Guild M.D.   On: 08/21/2022 08:06      LOS: 5 days   Jerene Bears, MD 08/22/2022, 11:12 AM See Shea Evans,  GI, to contact our on call provider

## 2022-08-22 NOTE — Progress Notes (Signed)
ANTICOAGULATION CONSULT NOTE - Initial Consult  Pharmacy Consult for heparin Indication: atrial fibrillation  Allergies  Allergen Reactions   Irbesartan Other (See Comments)    hyperkalemia   Cymbalta [Duloxetine Hcl]    Hydralazine Other (See Comments)    headache   Imdur [Isosorbide Nitrate] Other (See Comments)    headache   Rosuvastatin     Breast swelling/soreness   Atorvastatin Other (See Comments)    Muscle pain   Bystolic [Nebivolol Hcl]     Extreme fatigue     Patient Measurements: Weight: 67.8 kg (149 lb 7.6 oz) Heparin Dosing Weight: 68kg  Vital Signs: Temp: 97.8 F (36.6 C) (03/31 1400) Temp Source: Oral (03/31 1639) BP: 133/40 (03/31 1639) Pulse Rate: 47 (03/31 1639)  Labs: Recent Labs    08/20/22 0041 08/20/22 0639 08/21/22 0651 08/22/22 0825  HGB 8.4* 8.4* 8.9* 9.3*  HCT 25.8* 25.7* 27.7* 28.1*  PLT  --  156 196 204  CREATININE 8.24*  --  8.79* 8.73*    Estimated Creatinine Clearance: 6.5 mL/min (A) (by C-G formula based on SCr of 8.73 mg/dL (H)).   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 12/2018 Echo: EF 40-45%, global HK. Asc Ao 3.7cm; b. 01/2019 Lexi MV: small, mild, fixed basal and mid antlat defect - scar vs artifact. Small, mild mid and apical inf minimally reversible defect, likely scar w/ peri-infarct ischemia. Coronary and Ao atherosclerosis; c. 12/2019 Echo: EF 40-45%, glob HK, Gr1 DD. Nl RV fxn. Sev dil LA. Mild MR. Mild-mod Ao sclerosis w/o stenosis. Asc Ao 20mm.   Carotid arterial disease ([place])    a. 01/2020 RICA 1-39%, RCCA/RECA < [id number], LICA [id number], LCCA/LECA <50%.   Claudication ([place])    a. 02/2020 ABI/TBI: R 1.08/0.95, L 1.00/0.70.   Complication of anesthesia    had to be woken up  slowly as his bp was elevated when did this quickly   Diabetes mellitus without complication ([facility])    Dysplastic nevus 02/07/2013   L upper back, moderate atypia   ESRD (end stage renal disease) ([place])    a. Peritoneal Dialysis pt.   Hyperlipidemia    Hypertension    Migraine    cluster   Neuromuscular disorder ([place])    left lower extrem neuropathy   Obstructive sleep apnea    no OSA since had facial surgery with dr. Kathyrn Sheriff in 1997   PAD (peripheral artery disease) ([place]) 06/2007   nonobstructing, renal angiogram ([place])   Renal cell carcinoma 2004   left kidney heminephrectomy   Renal insufficiency    Sepsis ([place]) 11/06/2020   Skin cancer    R dorsum hand   Stroke ([place])    a. 12/2019 MRI: Small acute infarcts involving the left cerebral hemisphere and several chronic infarcts.   TIA (transient ischemic attack)    no residual but left leg and foot still feel heavy   tobacco abuse     Medications:  Medications Prior to Admission  Medication Sig Dispense Refill Last Dose   acetaminophen (TYLENOL) 325 MG tablet Take 2 tablets (650 mg total) by mouth every 4 (four) hours.   unk   albuterol (VENTOLIN HFA) 108 (90 Base) MCG/ACT inhaler Inhale 2 puffs into the lungs every 6 (six) hours as  needed for wheezing or shortness of breath. 8 g 11 unk   amiodarone (PACERONE) 200 MG tablet Take 1 tablet (200 mg total) by mouth daily. 90 tablet 3 Past Week   amLODipine (NORVASC) 5 MG tablet Take 1 tablet (5 mg total) by mouth daily. 30 tablet 11 Past Week   Ascorbic Acid 500 MG CHEW Chew 1 tablet by mouth daily.   Past Week   b complex vitamins capsule Take 1 capsule by mouth daily.   Past Week   bisoprolol (ZEBETA) 5 MG tablet Take 1 tablet (5 mg total) by mouth 2 (two) times daily. 60 tablet 0 Past Week   celecoxib (CELEBREX) 200 MG capsule Take 1 capsule (200 mg total) by mouth 2 (two) times daily.   Past Week   cloNIDine (CATAPRES - DOSED IN MG/24 HR) 0.1 mg/24hr patch Place 1 patch (0.1 mg  total) onto the skin every Friday. 12 patch 3 unk   cloNIDine (CATAPRES) 0.2 MG tablet Take 0.2 mg by mouth at bedtime.   Past Week   diphenhydrAMINE (BENADRYL) 25 MG tablet Take 25 mg by mouth at bedtime as needed for allergies or sleep.   Past Month at 2100   doxazosin (CARDURA) 8 MG tablet Take 8 mg by mouth daily.   Past Month   dupilumab (DUPIXENT) 300 MG/2ML prefilled syringe Inject 300 mg into the skin every 14 (fourteen) days. Starting at day 15 for maintenance. 4 mL 6 Past Month   ELIQUIS 5 MG TABS tablet TAKE 1 TABLET BY MOUTH TWICE DAILY 60 tablet 11 Past Week   ferric citrate (AURYXIA) 1 GM 210 MG(Fe) tablet Take 1 tablet by mouth 3 (three) times daily.   Past Week   fluticasone (FLONASE) 50 MCG/ACT nasal spray Place 2 sprays into both nostrils daily as needed for allergies or rhinitis.   Past Month   gentamicin cream (GARAMYCIN) 0.1 % Apply 1 application topically daily. 15 g 0 Past Month   HYDROmorphone (DILAUDID) 1 MG/ML injection Inject 0.5 mLs (0.5 mg total) into the vein every 2 (two) hours as needed for moderate pain or severe pain. 1 mL 0 Past Week   hydrOXYzine (ATARAX) 25 MG tablet Take 25 mg by mouth 3 (three) times daily.   Past Month   ketoconazole (NIZORAL) 2 % cream Apply 1 Application topically at bedtime. Qhs to feet for scaly rash on feet 60 g 6 Past Week   lactulose (CHRONULAC) 10 GM/15ML solution Take 15-30 mL by mouth daily as needed for constipation 946 mL 0 Past Week   losartan (COZAAR) 50 MG tablet Take 1 tablet (50 mg total) by mouth daily.      multivitamin (RENA-VIT) TABS tablet Take 1 tablet by mouth at bedtime.   Past Week   oxyCODONE (OXYCONTIN) 10 mg 12 hr tablet Take 1 tablet (10 mg total) by mouth every 12 (twelve) hours. 14 tablet 0 Past Week   sevelamer (RENAGEL) 800 MG tablet Take 800 mg by mouth 3 (three) times daily with meals.   Past Week   tacrolimus (PROTOPIC) 0.1 % ointment Apply topically 2 (two) times daily. Bid to itchy rash on body 100 g 3  Past Week   torsemide (DEMADEX) 100 MG tablet Take 100 mg by mouth daily.   Past Week   triamcinolone cream (KENALOG) 0.1 % Apply 1 Application topically as needed (itching).   unk   lidocaine (LIDODERM) 5 % Place 1 patch onto the skin daily. Remove & Discard patch within 12 hours or  as directed by MD 30 patch 0    Scheduled:   amiodarone  200 mg Oral Daily   amLODipine  5 mg Oral Daily   bisoprolol  5 mg Oral BID   Chlorhexidine Gluconate Cloth  6 each Topical Daily   cloNIDine  0.1 mg Transdermal Q Fri   doxazosin  8 mg Oral Daily   ferric citrate  420 mg Oral TID WC   gentamicin cream  1 Application Topical Daily   lidocaine  1 patch Transdermal QHS   losartan  50 mg Oral Daily   nicotine  21 mg Transdermal Daily   pantoprazole  40 mg Intravenous Q12H    Assessment: Pt with a hx of afib who was on apixaban for anticoagulation. It has been on hold due to possible GIB. Heparin has been order for bridging with a lower goal. aPTT and heparin level have been correlating.  Hgb 9.3 Plt wnl  Goal of Therapy:  Heparin level 0.3-0.5 units/ml Monitor platelets by anticoagulation protocol: Yes   Plan:  Heparin infusion at 1000 units/hr Check 8 hr HL Daily level and CBC  Onnie Boer, PharmD, BCIDP, AAHIVP, CPP Infectious Disease Pharmacist 08/22/2022 7:10 PM

## 2022-08-22 NOTE — Evaluation (Signed)
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 12/2018 Echo: EF 40-45%, global HK. Asc Ao 3.7cm; b. 01/2019 [facility] MV: small, mild, fixed basal and mid antlat defect - scar vs artifact. Small, mild mid and apical inf minimally reversible defect, likely scar w/ peri-infarct ischemia. Coronary and Ao atherosclerosis; c. 12/2019 Echo: EF 40-45%, glob HK, Gr1 DD. Nl RV fxn. Sev dil LA. Mild MR. Mild-mod Ao sclerosis w/o stenosis. Asc Ao 36mm.   Carotid arterial disease ([place])    a. 01/2020 RICA 1-39%, RCCA/RECA < [id number], LICA [id number], LCCA/LECA <50%.   Claudication ([place])    a. 02/2020 ABI/TBI: R 1.08/0.95, L 1.00/0.70.   Complication of anesthesia    had to be woken up slowly as his bp was elevated when did this quickly   Diabetes mellitus without complication (HCC)    Dysplastic nevus 02/07/2013   L upper back, moderate atypia   ESRD (end stage renal disease) ([place])    a. Peritoneal Dialysis pt.   Hyperlipidemia    Hypertension    Migraine    cluster   Neuromuscular disorder ([place])    left lower extrem neuropathy   Obstructive sleep apnea    no OSA since had facial surgery with dr. Kathyrn Sheriff in 1997   PAD (peripheral artery disease) ([place]) 06/2007   nonobstructing, renal angiogram ([place])   Renal cell carcinoma 2004   left kidney heminephrectomy   Renal insufficiency    Sepsis ([place]) 11/06/2020   Skin cancer    R dorsum hand   Stroke ([place])    a. 12/2019 MRI: Small acute infarcts involving  the left cerebral hemisphere and several chronic infarcts.   TIA (transient ischemic attack)    no residual but left leg and foot still feel heavy   tobacco abuse    Past Surgical History:  Past Surgical History:  Procedure Laterality Date   CAPD INSERTION N/A 03/01/2019   Procedure: LAPAROSCOPIC INSERTION CONTINUOUS AMBULATORY PERITONEAL DIALYSIS  (CAPD) CATHETER;  Surgeon: Algernon Huxley, MD;  Location: [facility] ORS;  Service: Vascular;  Laterality: N/A;   CARDIAC CATHETERIZATION     Dr. [name] did this to assess his renal artery   cyst removal  12/25/2015   Spine L4 and L5   heminephrectomy  2004   for renal cell CA   RENAL CRYOABLATION  Jan 2006   right kidney,  [facility]   RIGHT HEART CATH AND CORONARY ANGIOGRAPHY Bilateral 06/29/2022   Procedure: RIGHT HEART CATH AND CORONARY ANGIOGRAPHY;  Surgeon: [name], MD;  Location: [facility];  Service: Cardiovascular;  Laterality: Bilateral;   sciatica     HPI:  Pt is a 77 y.o. male who presented after a fall. Pt with nondisplaced L 7th and 8th rib fractures. GI consulted due to coffee-ground emesis, and nausea. Hematemesis thought to be due to Mallory-Weiss tear which resolved and EGD thought not be needed and GI signed off 3/31. SLP consulted 3/31 due to RN's report of pt having difficulty initiating the swallow  and coughing thereafter. Pt was on full liquids since 3/29, but has been cleared for advancement by Dr. Nena Alexander. PMH: CHF, A-fib, ESRD on peritoneal dialysis, hypertension, hyperlipidemia, CVA.    Assessment / Plan / Recommendation  Clinical Impression  Pt was seen for bedside swallow evaluation with his wife, cousins, and friend present. Pt family reported that since the pt's fall he has been coughing and clearing his throat with p.o. intake, but that he did not have any difficulty swallowing prior to his fall. Oral mechanism exam was limited due to pt's difficulty following commands; oral mucosa was moist and adequate  dentition noted. He demonstrated symptoms of oropharyngeal dysphagia characterized by oral holding, multiple swallows, and signs of aspiration with thin liquids, puree, and with nectar thick liquids. Symptoms were lessened, but not eliminated with reduced bolus sizes. An NPO status is recommended at this time. Pt's case was discussed with referring physician, Dr. Sloan Leiter, who advised that pt's critical meds cannot be switched to IV; it was therefore agreed that these be crushed and given in small boluses (i.e., 1/2 tsp) of puree. A modified barium swallow study is clinically indicated to further evaluate swallowing physiology, but this cannot be coordinated with radiology for completion today. SLP Visit Diagnosis: Dysphagia, unspecified (R13.10)    Aspiration Risk  Mild aspiration risk;Moderate aspiration risk    Diet Recommendation NPO except meds   Medication Administration: Crushed with puree (critical meds may be crushed an given in 1/2 tsp boluses of puree.)    Other  Recommendations Oral Care Recommendations: Oral care BID    Recommendations for follow up therapy are one component of a multi-disciplinary discharge planning process, led by the attending physician.  Recommendations may be updated based on patient status, additional functional criteria and insurance authorization.  Follow up Recommendations  (TBD)      Assistance Recommended at Discharge    Functional Status Assessment Patient has had a recent decline in their functional status and demonstrates the ability to make significant improvements in function in a reasonable and predictable amount of time.  Frequency and Duration min 2x/week  2 weeks       Prognosis Prognosis for improved oropharyngeal function: Good Barriers to Reach Goals: Severity of deficits;Cognitive deficits      Swallow Study   General Date of Onset: 08/21/22 HPI: Pt is a 77 y.o. male who presented after a fall. Pt with nondisplaced L 7th and 8th rib  fractures. GI consulted due to coffee-ground emesis, and nausea. Hematemesis thought to be due to Mallory-Weiss tear which resolved and EGD thought not be needed and GI signed off 3/31. SLP consulted 3/31 due to RN's report of pt having difficulty initiating the swallow and coughing thereafter. Pt was on full liquids since 3/29, but has been cleared for advancement by Dr. Nena Alexander. PMH: CHF, A-fib, ESRD on peritoneal dialysis, hypertension, hyperlipidemia, CVA. Type of Study: Bedside Swallow Evaluation Previous Swallow Assessment: none Diet Prior to this Study: Full liquid diet Temperature Spikes Noted: No Respiratory Status: Nasal cannula History of Recent Intubation: No Behavior/Cognition: Alert;Cooperative;Pleasant mood;Doesn't follow directions;Requires cueing Oral Cavity Assessment: Within Functional Limits Oral Care Completed by SLP: No Oral Cavity - Dentition: Adequate natural dentition Vision: Functional for self-feeding Self-Feeding Abilities: Total assist Patient Positioning: Upright in bed;Postural control adequate for testing Baseline Vocal Quality: Normal Volitional Cough: Cognitively unable to elicit Volitional Swallow: Unable to elicit    Oral/Motor/Sensory Function Overall Oral Motor/Sensory Function:  (difficult to assess)   [employer]  chips: Not tested   Thin Liquid Thin Liquid: Impaired Presentation: Cup;Straw Oral Phase Functional Implications: Oral holding Pharyngeal  Phase Impairments: Throat Clearing - Immediate;Cough - Immediate;Wet Vocal Quality    Nectar Thick Nectar Thick Liquid: Impaired Presentation: Cup;Straw Oral Phase Impairments: Poor awareness of bolus Oral phase functional implications: Oral holding Pharyngeal Phase Impairments: Throat Clearing - Immediate;Throat Clearing - Delayed;Cough - Immediate;Cough - Delayed;Wet Vocal Quality   Honey Thick Honey Thick Liquid: Not tested   Puree Puree: Impaired Presentation: Spoon Oral Phase Functional  Implications: Oral holding Pharyngeal Phase Impairments: Wet Vocal Quality;Throat Clearing - Immediate;Cough - Immediate;Cough - Delayed   Solid     Solid: Not tested     Taraya Steward I. Hardin Negus, [place], [place] Office number [phone]  Horton Marshall 08/22/2022,3:12 PM

## 2022-08-22 NOTE — Progress Notes (Signed)
Patient ID: [name], male   DOB: 28-Nov-1945, 77 y.o.   MRN: [id number] [facility] Progress Note   Assessment/ Plan:   1.  Mechanical fall with fracture of the left seventh/eighth and possibly 12th ribs: Originally transferred for suspicion of flail chest however this was ruled out by trauma surgery.  Ongoing efforts at symptom management. 2. ESRD: Continue peritoneal dialysis with cautious increase of ultrafiltration for volume to 1.5 L tonight along with 4.25% dialysate to help augment volume unloading/ultrafiltration.  Will give a single dose of Lokelma today for marginally elevated potassium. 3. Anemia: Currently with stable hemoglobin/hematocrit after having coffee-ground emesis earlier this week, concerns raised regarding Mallory-Weiss tear versus upper GI bleed with plans noted for EGD. 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  Switch from sevelamer to [place]. 5. Nutrition: Continue renal diet with fluid restriction/phosphorus restriction. 6.  Acute hypoxic respiratory failure: Suspected to be likely secondary to aspiration pneumonia in the setting of recurrent emesis/intolerance to PD fill volume.  Started on intravenous Unasyn with improvement of leukocytosis but continued dependency on oxygen supplementation/hypoxia noted.  Reattempt UF on PD.  Subjective:   Continues to have left chest discomfort with intermittent shortness of breath overnight requiring oxygen via nasal cannula.  Tolerated 1.2 L for volume with PD overnight.   Objective:   BP (!) 141/46 (BP Location: Left Arm)   Pulse (!) 52   Temp 97.6 F (36.4 C) (Oral)   Resp 13   Wt 67.8 kg   SpO2 96%   BMI 24.13 kg/m   Physical Exam: Gen: Somnolent/fatigued resting in bed and arouses to touch.  On oxygen via nasal cannula CVS: Pulse regular rhythm, normal rate, S1 and S2 normal Resp: Poor inspiratory effort with decreased breath  sounds over bases, no rales/rhonchi Abd: Soft, slightly distended, bowel sounds normal Ext: No lower extremity edema  Labs: BMET Recent Labs  Lab 08/16/22 2116 08/17/22 0436 08/18/22 2140 08/19/22 0929 08/20/22 0041 08/21/22 0651 08/22/22 0825  NA 134* 138 137 136 135 135 135  K 5.0 5.1 4.7 4.9 4.8 4.9 5.2*  CL 101 110 102 103 101 99 100  CO2 24 23 24 22 22 23  21*  GLUCOSE 127* 96 203* 72 77 60* 92  BUN 48* 48* 49* 50* 52* 70* 75*  CREATININE 7.41* 7.42* 8.09* 8.18* 8.24* 8.79* 8.73*  CALCIUM 8.0* 7.8* 8.1* 7.5* 7.6* 7.9* 7.9*  PHOS  --  6.6*  --  6.5* 6.8* 7.3* 6.9*   CBC Recent Labs  Lab 08/16/22 2116 08/17/22 0436 08/18/22 2140 08/19/22 0929 08/19/22 1304 08/20/22 0041 08/20/22 0639 08/21/22 0651 08/22/22 0825  WBC 12.9*   < > 19.1* 16.0*  --   --  10.8* 9.5 11.5*  NEUTROABS 10.2*  --  17.3*  --   --   --   --   --   --   HGB 9.9*   < > 9.8* 9.1*   < > 8.4* 8.4* 8.9* 9.3*  HCT 30.2*   < > 29.0* 27.3*   < > 25.8* 25.7* 27.7* 28.1*  MCV 88.3   < > 86.6 87.8  --   --  88.0 88.5 87.3  PLT 165   < > 165 170  --   --  156 196 204   < > = values in this interval not displayed.      Medications:     acetaminophen  1,000 mg Oral Q8H  amiodarone  200 mg Oral Daily   amLODipine  5 mg Oral Daily   bisoprolol  5 mg Oral BID   Chlorhexidine Gluconate Cloth  6 each Topical Daily   cloNIDine  0.1 mg Transdermal Q Fri   docusate sodium  100 mg Oral BID   doxazosin  8 mg Oral Daily   gentamicin cream  1 Application Topical Daily   guaiFENesin  600 mg Oral BID   lidocaine  1 patch Transdermal QHS   losartan  50 mg Oral Daily   nicotine  21 mg Transdermal Daily   pantoprazole  40 mg Intravenous Q12H   polyethylene glycol  17 g Oral BID   scopolamine  1 patch Transdermal Q72H   sevelamer carbonate  2,400 mg Oral TID WC   Elmarie Shiley, MD 08/22/2022, 9:47 AM

## 2022-08-22 NOTE — Progress Notes (Signed)
PD post treatment note  PD treatment completed. Patient tolerated treatment well. PD effluent is clear. No specimen collected.  PD exit site clean, dry and intact. Patient is awake, oriented and in no acute distress.  Report given to bedside nurse.   Post treatment VS: 133/51 48 14 96%  Total UF removed: 56 mls   Post treatment weight: 67.8 kg    08/22/22 0332  Peritoneal Catheter Left lower abdomen Continuous ambulatory  Placement Date/Time: 03/01/19 1310   Procedural Verification: Medical records & consent reviewed;Relevant studies,results and images reviewed  Time out: Correct Patient;Correct Site;Correct Procedure;Special equipment/requirements available  Person In...  Site Assessment Clean, Dry, Intact;Clean  Drainage Description None  Catheter status Deaccessed  Dressing Gauze/Drain sponge  Dressing Status Clean, Dry, Intact  Dressing Intervention Dressing reinforced  Completion  Treatment Status Complete  Initial Drain Volume 1  Average Dwell Time-Hour(s) 1  Average Dwell Time-Min(s) 30  Average Drain Time 23  Total Therapy Volume 4800  Total Therapy Time-Hour(s) 8  Total Therapy Time-Min(s) 30  Weight after Drain 149 lb 7.6 oz (67.8 kg)  Effluent Appearance Clear  Cell Count on Daytime Exchange N/A  Fluid Balance - CCPD  Total UF (+ value on cycler, pt loss) 56 mL  Procedure Comments  Tolerated treatment well? Yes  Peritoneal Dialysis Comments PD tx completed as expected without complication.  Education / Care Plan  Dialysis Education Provided Yes  Documented Education in Care Plan Yes  Hand-off documentation  Hand-off Given Given to shift RN/LPN  Report given to (Full Name) Betsy Coder, RN  Hand-off Received Received from shift RN/LPN  Report received from (Full Name) Mailey Landstrom, RN

## 2022-08-22 NOTE — Progress Notes (Signed)
PROGRESS NOTE        PATIENT DETAILS Name: [name] Age: 77 y.o. Sex: male Date of Birth: Jan 14, 1946 Admit Date: 08/17/2022 Admitting Physician Emilee Hero, MD [id number], Aris Everts, MD  Brief Summary: Patient is a 77 y.o.  male with history of ESRD on peritoneal dialysis, A-fib on Eliquis, HTN, HLD-who was transferred from [facility] for concern for a flail chest following a fall with rib fracture.  Significant events: 3/25-3/26>> hospitalization at [facility] following a fall-concern for flail chest-transferred to [facility] for trauma evaluation. 3/27>> numerous episodes of vomiting evening-into night-vomitus became coffee-ground-some intermittent hypoxia.  Kept n.p.o.-PPI infusion started.  Significant studies: 3/25>> CT chest: Nondisplaced left seventh/eighth rib fracture, possible 12th left rib fracture. 3/25>> CT abdomen/pelvis: Extensive pneumoperitoneum-in the setting of PD catheter.  6.4 cm left cystic lesion-suggestive of a hemorrhagic cyst.  Significant microbiology data: 3/28>> blood cultures: No growth  Procedures: None  Consults: Nephrology Trauma surgery GI  Subjective: Some confusion overnight-but relatively awake/alert this morning-answering questions appropriately.  No major pain issues per nursing staff-tolerating just Tylenol well.  Down to 2 L of oxygen this morning.  Objective: Vitals: Blood pressure (!) 141/46, pulse (!) 52, temperature 97.6 F (36.4 C), temperature source Oral, resp. rate 13, weight 67.8 kg, SpO2 96 %.   Exam: Gen Exam: Frail-chronically sick appearing but not in any distress. HEENT:atraumatic, normocephalic Chest: B/L clear to auscultation anteriorly CVS:S1S2 regular Abdomen:soft non tender, non distended Extremities:no edema Neurology: Non focal Skin: no rash  Pertinent Labs/Radiology:    Latest Ref Rng & Units 08/22/2022    8:25 AM 08/21/2022    6:51 AM 08/20/2022    6:39 AM  CBC  WBC 4.0 - 10.5  K/uL 11.5  9.5  10.8   Hemoglobin 13.0 - 17.0 g/dL 9.3  8.9  8.4   Hematocrit 39.0 - 52.0 % 28.1  27.7  25.7   Platelets 150 - 400 K/uL 204  196  156     Lab Results  Component Value Date   NA 135 08/22/2022   K 5.2 (H) 08/22/2022   CL 100 08/22/2022   CO2 21 (L) 08/22/2022      Assessment/Plan: Mechanical fall with left seventh, eighth and possible 12th rib fracture Pain much better-has not required narcotics for the past several days-controlled with just Tylenol Changed to as needed Tylenol Continue to encourage incentive spirometry/mobilization Continue to avoid narcotics as much as possible.   Intractable nausea/vomiting Upper GI bleed with coffee-ground emesis-?  Mallory-Weiss tear No further vomiting-for almost 3 days-Hb stable Remains on PPI Remains on as needed antiemetics Diet being advanced GI following-endoscopy evaluation over the next several days depending on oxygen requirement. Eliquis remains on hold for now.  Acute hypoxic respiratory failure Multifactorial-initially due to rib fractures/splinting-then developed aspiration pneumonia-likely due to pulmonary edema in the setting of ineffective peritoneal dialysis. Improving with treatment of underlying etiologies-on IV antibiotics for PNA-nephrology adjusting peritoneal dialysis regimen.  No significant response to high-dose IV Lasix yesterday. Down to just 2 L of oxygen  Aspiration pneumonia Much improved Leukocytosis improved. Cultures negative so far IV Unasyn x 5 days total.    Left renal hemorrhagic cyst Incidental finding on CT abdomen Discussed with renal MD-no reason to continue holding Eliquis for this issue-as hemoglobin stable.  ESRD on peritoneal dialysis Hyperkalemia BUN/creatinine continue to to rise-potassium slightly elevated  today Nephrology following-adjustment being made to his peritoneal dialysis regimen. Lokelma ordered by nephrology. Repeat electrolytes tomorrow-if hyperkalemia  persists-May need to hold ARB.  Acute urinary retention  Occurred on 3/28 when patient had vomiting-extensive pain issues Voiding trial today-remove Foley  PAF Sinus rhythm on telemetry Continue amiodarone Eliquis on hold-due to possible hemorrhagic cyst-and now due to possible upper GI bleeding with Mallory-Weiss tear  Acute on chronic chronic HFrEF See above Volume removal with PD. Marland Kitchen CAD (LHC 06/30/2022-two-vessel disease-70-80% mid LAD stenosis, chronic total occlusion of RCA with left-right collaterals-optimization of medical therapy recommended) Not on antiplatelets as he is on Eliquis Intolerant to statin Currently without any anginal symptoms-his left-sided musculoskeletal chest pain is from rib fractures.  HTN BP reasonable Continue amlodipine/bisoprolol/clonidine/doxazosin and losartan.    BMI: Estimated body mass index is 24.13 kg/m as calculated from the following:   Height as of an earlier encounter on 08/17/22: 5\' 6"  (1.676 m).   Weight as of this encounter: 67.8 kg.   Code status:   Code Status: Full Code   DVT Prophylaxis: Place TED hose Start: 08/17/22 2148   Family Communication: Mcarther Malley [id number] 3/31   Disposition Plan: Status is: Inpatient Remains inpatient appropriate because: Severity of illness   Planned Discharge Destination:Home health   Diet: Diet Order             Diet full liquid Room service appropriate? Yes; Fluid consistency: Thin  Diet effective now                     Antimicrobial agents: Anti-infectives (From admission, onward)    Start     Dose/Rate Route Frequency Ordered Stop   08/19/22 1100  Ampicillin-Sulbactam (UNASYN) 3 g in sodium chloride 0.9 % 100 mL IVPB        3 g 200 mL/hr over 30 Minutes Intravenous Every 24 hours 08/19/22 0957 08/23/22 2359        MEDICATIONS: Scheduled Meds:  acetaminophen  1,000 mg Oral Q8H   amiodarone  200 mg Oral Daily   amLODipine  5 mg Oral Daily    bisoprolol  5 mg Oral BID   Chlorhexidine Gluconate Cloth  6 each Topical Daily   cloNIDine  0.1 mg Transdermal Q Fri   docusate sodium  100 mg Oral BID   doxazosin  8 mg Oral Daily   ferric citrate  420 mg Oral TID WC   gentamicin cream  1 Application Topical Daily   guaiFENesin  600 mg Oral BID   lidocaine  1 patch Transdermal QHS   losartan  50 mg Oral Daily   nicotine  21 mg Transdermal Daily   pantoprazole  40 mg Intravenous Q12H   polyethylene glycol  17 g Oral BID   scopolamine  1 patch Transdermal Q72H   sodium zirconium cyclosilicate  5 g Oral Once   Continuous Infusions:  ampicillin-sulbactam (UNASYN) IV 3 g (08/21/22 0956)   dialysis solution 4.25% low-MG/low-CA     promethazine (PHENERGAN) injection (IM or IVPB) Stopped (08/18/22 1826)   promethazine (PHENERGAN) injection (IM or IVPB)     PRN Meds:.lactulose, metoCLOPramide (REGLAN) injection, naLOXone (NARCAN)  injection, ondansetron **OR** ondansetron (ZOFRAN) IV, oxyCODONE, promethazine **OR** promethazine (PHENERGAN) injection (IM or IVPB) **OR** promethazine, promethazine (PHENERGAN) injection (IM or IVPB)   I have personally reviewed following labs and imaging studies  LABORATORY DATA: CBC: Recent Labs  Lab 08/16/22 2116 08/17/22 0436 08/18/22 2140 08/19/22 0929 08/19/22 1304 08/19/22 1855 08/20/22 0041 08/20/22 [id number]  08/21/22 0651 08/22/22 0825  WBC 12.9*   < > 19.1* 16.0*  --   --   --  10.8* 9.5 11.5*  NEUTROABS 10.2*  --  17.3*  --   --   --   --   --   --   --   HGB 9.9*   < > 9.8* 9.1*   < > 8.8* 8.4* 8.4* 8.9* 9.3*  HCT 30.2*   < > 29.0* 27.3*   < > 26.6* 25.8* 25.7* 27.7* 28.1*  MCV 88.3   < > 86.6 87.8  --   --   --  88.0 88.5 87.3  PLT 165   < > 165 170  --   --   --  156 196 204   < > = values in this interval not displayed.     Basic Metabolic Panel: Recent Labs  Lab 08/17/22 0436 08/18/22 2140 08/19/22 0929 08/20/22 0041 08/21/22 0651 08/22/22 0825  NA 138 137 136 135 135 135   K 5.1 4.7 4.9 4.8 4.9 5.2*  CL 110 102 103 101 99 100  CO2 23 24 22 22 23  21*  GLUCOSE 96 203* 72 77 60* 92  BUN 48* 49* 50* 52* 70* 75*  CREATININE 7.42* 8.09* 8.18* 8.24* 8.79* 8.73*  CALCIUM 7.8* 8.1* 7.5* 7.6* 7.9* 7.9*  PHOS 6.6*  --  6.5* 6.8* 7.3* 6.9*     GFR: Estimated Creatinine Clearance: 6.5 mL/min (A) (by C-G formula based on SCr of 8.73 mg/dL (H)).  Liver Function Tests: Recent Labs  Lab 08/16/22 2116 08/18/22 2140 08/19/22 0929 08/20/22 0041 08/21/22 0651 08/22/22 0825  AST 12* 13*  --   --   --   --   ALT 12 13  --   --   --   --   ALKPHOS 107 122  --   --   --   --   BILITOT 0.5 0.6  --   --   --   --   PROT 5.2* 4.9*  --   --   --   --   ALBUMIN 2.1* 1.9* 1.7* <1.5* <1.5* <1.5*    No results for input(s): "LIPASE", "AMYLASE" in the last 168 hours. No results for input(s): "AMMONIA" in the last 168 hours.  Coagulation Profile: Recent Labs  Lab 08/17/22 0436  INR 1.2     Cardiac Enzymes: No results for input(s): "CKTOTAL", "CKMB", "CKMBINDEX", "TROPONINI" in the last 168 hours.  BNP (last 3 results) No results for input(s): "PROBNP" in the last 8760 hours.  Lipid Profile: No results for input(s): "CHOL", "HDL", "LDLCALC", "TRIG", "CHOLHDL", "LDLDIRECT" in the last 72 hours.  Thyroid Function Tests: No results for input(s): "TSH", "T4TOTAL", "FREET4", "T3FREE", "THYROIDAB" in the last 72 hours.  Anemia Panel: No results for input(s): "VITAMINB12", "FOLATE", "FERRITIN", "TIBC", "IRON", "RETICCTPCT" in the last 72 hours.  Urine analysis:    Component Value Date/Time   COLORURINE STRAW (A) 11/28/2020 1055   APPEARANCEUR CLEAR (A) 11/28/2020 1055   APPEARANCEUR Clear 01/03/2012 1117   LABSPEC 1.008 11/28/2020 1055   LABSPEC 1.004 01/03/2012 1117   PHURINE 7.0 11/28/2020 1055   GLUCOSEU 50 (A) 11/28/2020 1055   GLUCOSEU 100 (A) 01/21/2020 1223   HGBUR NEGATIVE 11/28/2020 [place] 11/28/2020 1055   BILIRUBINUR neg  07/30/2013 1127   BILIRUBINUR Negative 01/03/2012 1117   KETONESUR NEGATIVE 11/28/2020 1055   PROTEINUR 30 (A) 11/28/2020 1055   UROBILINOGEN 0.2 01/21/2020 1223  NITRITE NEGATIVE 11/28/2020 1055   LEUKOCYTESUR NEGATIVE 11/28/2020 1055   LEUKOCYTESUR Negative 01/03/2012 1117    Sepsis Labs: Lactic Acid, Venous    Component Value Date/Time   LATICACIDVEN 0.8 08/19/2022 0929    MICROBIOLOGY: Recent Results (from the past 240 hour(s))  Culture, blood (Routine X 2) w Reflex to ID Panel     Status: None (Preliminary result)   Collection Time: 08/19/22  9:29 AM   Specimen: BLOOD LEFT HAND  Result Value Ref Range Status   Specimen Description BLOOD LEFT HAND  Final   Special Requests   Final    BOTTLES DRAWN AEROBIC AND ANAEROBIC Blood Culture adequate volume   Culture   Final    NO GROWTH 2 DAYS Performed at [facility], [place] [address], [place], [place] [address]    Report Status PENDING  Incomplete  Culture, blood (Routine X 2) w Reflex to ID Panel     Status: None (Preliminary result)   Collection Time: 08/19/22  9:34 AM   Specimen: BLOOD LEFT HAND  Result Value Ref Range Status   Specimen Description BLOOD LEFT HAND  Final   Special Requests   Final    BOTTLES DRAWN AEROBIC AND ANAEROBIC Blood Culture adequate volume   Culture   Final    NO GROWTH 2 DAYS Performed at [facility], [place] [address] [place], [place] [address]    Report Status PENDING  Incomplete    RADIOLOGY STUDIES/RESULTS: DG Chest Port 1V same Day  Result Date: 08/21/2022 CLINICAL DATA:  Shortness of breath EXAM: PORTABLE CHEST 1 VIEW COMPARISON:  Yesterday FINDINGS: Cardiopericardial enlargement. Right more than left pleural effusion with underlying pulmonary opacity and interstitial thickening. No pneumothorax. IMPRESSION: Continued CHF pattern. Electronically Signed   By: Jorje Guild M.D.   On: 08/21/2022 08:06     LOS: 5 days   Oren Binet, MD  [name]    To  contact the attending provider between 7A-7P or the covering provider during after hours 7P-7A, please log into the web site [email or web address] and access using universal [place] password for that web site. If you do not have the password, please call the hospital operator.  08/22/2022, 10:15 AM

## 2022-08-22 NOTE — Progress Notes (Signed)
PD tx initation note:   Pre TX VS: 119/78  Pre TX weight: 67.1 kg  PD treatment initiated via aseptic technique. Consent signed and in chart. Patient is alert and oriented. No complaints of pain. No specimen collected. PD exit site clean, dry and intact. Gentamycin and new dressing applied. Bedside RN educated on PD machine and how to contact tech support when PD machine alarms.

## 2022-08-23 ENCOUNTER — Inpatient Hospital Stay (HOSPITAL_COMMUNITY): Payer: Medicare PPO

## 2022-08-23 DIAGNOSIS — I2510 Atherosclerotic heart disease of native coronary artery without angina pectoris: Secondary | ICD-10-CM | POA: Diagnosis not present

## 2022-08-23 DIAGNOSIS — S2242XD Multiple fractures of ribs, left side, subsequent encounter for fracture with routine healing: Secondary | ICD-10-CM | POA: Diagnosis not present

## 2022-08-23 DIAGNOSIS — I10 Essential (primary) hypertension: Secondary | ICD-10-CM | POA: Diagnosis not present

## 2022-08-23 DIAGNOSIS — N186 End stage renal disease: Secondary | ICD-10-CM | POA: Diagnosis not present

## 2022-08-23 LAB — GLUCOSE, CAPILLARY
Glucose-Capillary: 166 mg/dL — ABNORMAL HIGH (ref 70–99)
Glucose-Capillary: 197 mg/dL — ABNORMAL HIGH (ref 70–99)

## 2022-08-23 LAB — HEPARIN LEVEL (UNFRACTIONATED)
Heparin Unfractionated: 0.18 IU/mL — ABNORMAL LOW (ref 0.30–0.70)
Heparin Unfractionated: 0.13 IU/mL — ABNORMAL LOW (ref 0.30–0.70)

## 2022-08-23 LAB — CBC
HCT: 30.4 % — ABNORMAL LOW (ref 39.0–52.0)
Hemoglobin: 10 g/dL — ABNORMAL LOW (ref 13.0–17.0)
MCH: 28.9 pg (ref 26.0–34.0)
MCHC: 32.9 g/dL (ref 30.0–36.0)
MCV: 87.9 fL (ref 80.0–100.0)
Platelets: 225 10*3/uL (ref 150–400)
RBC: 3.46 MIL/uL — ABNORMAL LOW (ref 4.22–5.81)
RDW: 16 % — ABNORMAL HIGH (ref 11.5–15.5)
WBC: 12.6 10*3/uL — ABNORMAL HIGH (ref 4.0–10.5)
nRBC: 0.3 % — ABNORMAL HIGH (ref 0.0–0.2)

## 2022-08-23 LAB — RENAL FUNCTION PANEL
Albumin: 1.5 g/dL — ABNORMAL LOW (ref 3.5–5.0)
Anion gap: 12 (ref 5–15)
BUN: 75 mg/dL — ABNORMAL HIGH (ref 8–23)
CO2: 23 mmol/L (ref 22–32)
Calcium: 8 mg/dL — ABNORMAL LOW (ref 8.9–10.3)
Chloride: 101 mmol/L (ref 98–111)
Creatinine, Ser: 8.61 mg/dL — ABNORMAL HIGH (ref 0.61–1.24)
GFR, Estimated: 6 mL/min — ABNORMAL LOW (ref >60)
Glucose, Bld: 165 mg/dL — ABNORMAL HIGH (ref 70–99)
Phosphorus: 6.9 mg/dL — ABNORMAL HIGH (ref 2.5–4.6)
Potassium: 4.3 mmol/L (ref 3.5–5.1)
Sodium: 136 mmol/L (ref 135–145)

## 2022-08-23 LAB — PHOSPHORUS: Phosphorus: 6 mg/dL — ABNORMAL HIGH (ref 2.5–4.6)

## 2022-08-23 LAB — MAGNESIUM: Magnesium: 2 mg/dL (ref 1.7–2.4)

## 2022-08-23 MED ORDER — FREE WATER
100.0000 mL | Freq: Three times a day (TID) | Status: DC
  Administered 2022-08-23 – 2022-08-30 (×20): 100 mL

## 2022-08-23 MED ORDER — DELFLEX-LC/2.5% DEXTROSE 394 MOSM/L IP SOLN: Freq: Every day | INTRAPERITONEAL | Status: DC

## 2022-08-23 MED ORDER — OSMOLITE 1.5 CAL PO LIQD
1000.0000 mL | ORAL | Status: DC
  Administered 2022-08-23 – 2022-08-30 (×6): 1000 mL
  Filled 2022-08-23 (×9): qty 1000

## 2022-08-23 MED ORDER — FENTANYL CITRATE PF 50 MCG/ML IJ SOSY
12.5000 ug | PREFILLED_SYRINGE | Freq: Four times a day (QID) | INTRAMUSCULAR | Status: DC | PRN
  Administered 2022-08-24: 12.5 ug via INTRAVENOUS
  Filled 2022-08-23: qty 1

## 2022-08-23 MED ORDER — RENA-VITE PO TABS
1.0000 | ORAL_TABLET | Freq: Every day | ORAL | Status: DC
  Administered 2022-08-23 – 2022-08-29 (×7): 1
  Filled 2022-08-23 (×7): qty 1

## 2022-08-23 MED ORDER — PROSOURCE TF20 ENFIT COMPATIBL EN LIQD
60.0000 mL | Freq: Every day | ENTERAL | Status: DC
  Administered 2022-08-23 – 2022-08-30 (×6): 60 mL
  Filled 2022-08-23 (×7): qty 60

## 2022-08-23 NOTE — Progress Notes (Addendum)
PROGRESS NOTE        PATIENT DETAILS Name: [name] Age: 77 y.o. Sex: male Date of Birth: [birth year] Admit Date: 08/17/2022 Admitting Physician Brett Hero, MD [id number], Brett Everts, MD  Brief Summary: Patient is a 77 y.o.  male with history of ESRD on peritoneal dialysis, A-fib on Eliquis, HTN, HLD-who was transferred from [facility] for concern for a flail chest following a fall with rib fracture.  Significant events: 3/25-3/26>> hospitalization at [facility] following a fall-concern for flail chest-transferred to [facility] for trauma evaluation. 3/27>> numerous episodes of vomiting evening-into night-vomitus became coffee-ground-some intermittent hypoxia.  Kept n.p.o.-PPI infusion started. 3/31>> Per GI-no EGD-okay to restart anticoagulation.  IV heparin started.  SLP eval-keep n.p.o.  Significant studies: 3/25>> CT chest: Nondisplaced left seventh/eighth rib fracture, possible 12th left rib fracture. 3/25>> CT abdomen/pelvis: Extensive pneumoperitoneum-in the setting of PD catheter.  6.4 cm left cystic lesion-suggestive of a hemorrhagic cyst.  Significant microbiology data: 3/28>> blood cultures: No growth  Procedures: None  Consults: Nephrology Trauma surgery GI  Subjective: Some pain in the right chest area today.  Otherwise uneventful night.  Objective: Vitals: Blood pressure (!) 121/41, pulse (!) 49, temperature 97.8 F (36.6 C), temperature source Oral, resp. rate 14, height 5\' 6"  (1.676 m), weight 67.8 kg, SpO2 (!) 81 %.   Exam: Gen Exam: Awake/alert-chronically frail/sick appearing HEENT:atraumatic, normocephalic Chest: B/L clear to auscultation anteriorly CVS:S1S2 regular Abdomen:soft non tender, non distended Extremities:no edema Neurology: Non focal-but with generalized weakness. Skin: no rash  Pertinent Labs/Radiology:    Latest Ref Rng & Units 08/23/2022    7:50 AM 08/22/2022    8:25 AM 08/21/2022    6:51 AM  CBC  WBC 4.0 -  10.5 K/uL 12.6  11.5  9.5   Hemoglobin 13.0 - 17.0 g/dL 10.0  9.3  8.9   Hematocrit 39.0 - 52.0 % 30.4  28.1  27.7   Platelets 150 - 400 K/uL 225  204  196     Lab Results  Component Value Date   NA 136 08/23/2022   K 4.3 08/23/2022   CL 101 08/23/2022   CO2 23 08/23/2022      Assessment/Plan: Mechanical fall with left seventh, eighth and possible 12th rib fracture Pain issues much better Continue prn Tylenol-minimize narcotics as much as possible pain Incentive amatory/pulmonary tolerating Mobilization with nursing staff/PT/OT.   Trauma team has signed off.   Intractable nausea/vomiting Upper GI bleed with coffee-ground emesis-?  Mallory-Weiss tear Vomiting has resolved-no vomiting for almost 4 days Hemoglobin stable-not felt to be actively bleeding Per GI note on 3/31-no role for endoscopy as bleeding seems to have completely resolved IV heparin started  Acute hypoxic respiratory failure Multifactorial-initially due to rib fractures/splinting-then developed aspiration pneumonia-likely due to pulmonary edema in the setting of ineffective peritoneal dialysis. Stable on anywhere from 2-4 L of oxygen for the past several days  Continue to treat underlying etiologies is much as possible  Aspiration pneumonia Much improved Leukocytosis improved. Cultures negative so far IV Unasyn x 5 days total.   Kept n.p.o. after SLP eval on 3/31-awaiting MBS study today.  If felt to be high risk for oral intake-will require a temporary NG tube for feedings  Left renal hemorrhagic cyst Incidental finding on CT abdomen Discussed with renal MD-no reason to continue holding Eliquis for this issue-as hemoglobin stable.  ESRD on peritoneal dialysis Hyperkalemia BUNs/creatinine remain on the higher side Nephrology following and adjusting PD regimen Has resolved Discussed with nephrologist-patient has had severe decline in functional status-May need to go to SNF-May need to be transition to  HD if SNF cannot do PD.  Acute urinary retention  Occurred on 3/28 when patient had vomiting-extensive pain issues Foley catheter removed on 3/31-has not voided yet-continue frequent bladder scans-if has significant residual urine on bladder scans-will replace Foley catheter.  PAF Sinus rhythm on telemetry Continue amiodarone Eliquis initially on hold-IV heparin started 3/31-watch for signs of bleeding.   Acute on chronic chronic HFrEF See above Volume removal with PD. Marland Kitchen CAD (LHC 06/30/2022-two-vessel disease-70-80% mid LAD stenosis, chronic total occlusion of RCA with left-right collaterals-optimization of medical therapy recommended) Not on antiplatelets as he is on Eliquis Intolerant to statin Currently without any anginal symptoms-his left-sided musculoskeletal chest pain is from rib fractures.  HTN BP reasonable Continue amlodipine/bisoprolol/clonidine/doxazosin and losartan.    Debility/deconditioning Likely due to acute illness-has had significant decline in functional status over the past several days Will ask PT/OT to evaluate discharge disposition.  May need SNF. Multiple issues/setbacks during this hospitalization-now with severe failure to thrive-will ask palliative care to evaluate  Nutrition Status: Nutrition Problem: Moderate Malnutrition Etiology: chronic illness Signs/Symptoms: severe muscle depletion, mild fat depletion Interventions: Tube feeding, MVI    BMI: Estimated body mass index is 24.13 kg/m as calculated from the following:   Height as of this encounter: 5\' 6"  (1.676 m).   Weight as of this encounter: 67.8 kg.   Code status:   Code Status: Full Code   DVT Prophylaxis: Place TED hose Start: 08/17/22 2148   Family Communication: [name] [id number] 4/1   Disposition Plan: Status is: Inpatient Remains inpatient appropriate because: Severity of illness   Planned Discharge Destination:Home health   Diet: Diet Order              Diet NPO time specified  Diet effective now                     Antimicrobial agents: Anti-infectives (From admission, onward)    Start     Dose/Rate Route Frequency Ordered Stop   08/19/22 1100  Ampicillin-Sulbactam (UNASYN) 3 g in sodium chloride 0.9 % 100 mL IVPB        3 g 200 mL/hr over 30 Minutes Intravenous Every 24 hours 08/19/22 0957 08/23/22 2359        MEDICATIONS: Scheduled Meds:  amiodarone  200 mg Oral Daily   amLODipine  5 mg Oral Daily   bisoprolol  5 mg Oral BID   Chlorhexidine Gluconate Cloth  6 each Topical Daily   cloNIDine  0.1 mg Transdermal Q Fri   doxazosin  8 mg Oral Daily   ferric citrate  420 mg Oral TID WC   gentamicin cream  1 Application Topical Daily   lidocaine  1 patch Transdermal QHS   losartan  50 mg Oral Daily   nicotine  21 mg Transdermal Daily   pantoprazole  40 mg Intravenous Q12H   Continuous Infusions:  ampicillin-sulbactam (UNASYN) IV 3 g (08/22/22 1025)   dialysis solution 2.5% low-MG/low-CA     heparin 1,000 Units/hr (08/22/22 1954)   promethazine (PHENERGAN) injection (IM or IVPB) Stopped (08/18/22 1826)   promethazine (PHENERGAN) injection (IM or IVPB)     PRN Meds:.acetaminophen, fentaNYL (SUBLIMAZE) injection, lactulose, metoCLOPramide (REGLAN) injection, naLOXone (NARCAN)  injection, ondansetron **OR** ondansetron (ZOFRAN) IV, oxyCODONE,  polyethylene glycol, promethazine **OR** promethazine (PHENERGAN) injection (IM or IVPB) **OR** promethazine, promethazine (PHENERGAN) injection (IM or IVPB)   I have personally reviewed following labs and imaging studies  LABORATORY DATA: CBC: Recent Labs  Lab 08/16/22 2116 08/17/22 0436 08/18/22 2140 08/19/22 0929 08/19/22 1304 08/20/22 0041 08/20/22 0639 08/21/22 0651 08/22/22 0825 08/23/22 0750  WBC 12.9*   < > 19.1* 16.0*  --   --  10.8* 9.5 11.5* 12.6*  NEUTROABS 10.2*  --  17.3*  --   --   --   --   --   --   --   HGB 9.9*   < > 9.8* 9.1*   < > 8.4* 8.4*  8.9* 9.3* 10.0*  HCT 30.2*   < > 29.0* 27.3*   < > 25.8* 25.7* 27.7* 28.1* 30.4*  MCV 88.3   < > 86.6 87.8  --   --  88.0 88.5 87.3 87.9  PLT 165   < > 165 170  --   --  156 196 204 225   < > = values in this interval not displayed.     Basic Metabolic Panel: Recent Labs  Lab 08/19/22 0929 08/20/22 0041 08/21/22 0651 08/22/22 0825 08/23/22 0750  NA 136 135 135 135 136  K 4.9 4.8 4.9 5.2* 4.3  CL 103 101 99 100 101  CO2 22 22 23  21* 23  GLUCOSE 72 77 60* 92 165*  BUN 50* 52* 70* 75* 75*  CREATININE 8.18* 8.24* 8.79* 8.73* 8.61*  CALCIUM 7.5* 7.6* 7.9* 7.9* 8.0*  PHOS 6.5* 6.8* 7.3* 6.9* 6.9*     GFR: Estimated Creatinine Clearance: 6.6 mL/min (A) (by C-G formula based on SCr of 8.61 mg/dL (H)).  Liver Function Tests: Recent Labs  Lab 08/16/22 2116 08/18/22 2140 08/19/22 0929 08/20/22 0041 08/21/22 0651 08/22/22 0825 08/23/22 0750  AST 12* 13*  --   --   --   --   --   ALT 12 13  --   --   --   --   --   ALKPHOS 107 122  --   --   --   --   --   BILITOT 0.5 0.6  --   --   --   --   --   PROT 5.2* 4.9*  --   --   --   --   --   ALBUMIN 2.1* 1.9* 1.7* <1.5* <1.5* <1.5* 1.5*    No results for input(s): "LIPASE", "AMYLASE" in the last 168 hours. No results for input(s): "AMMONIA" in the last 168 hours.  Coagulation Profile: Recent Labs  Lab 08/17/22 0436  INR 1.2     Cardiac Enzymes: No results for input(s): "CKTOTAL", "CKMB", "CKMBINDEX", "TROPONINI" in the last 168 hours.  BNP (last 3 results) No results for input(s): "PROBNP" in the last 8760 hours.  Lipid Profile: No results for input(s): "CHOL", "HDL", "LDLCALC", "TRIG", "CHOLHDL", "LDLDIRECT" in the last 72 hours.  Thyroid Function Tests: No results for input(s): "TSH", "T4TOTAL", "FREET4", "T3FREE", "THYROIDAB" in the last 72 hours.  Anemia Panel: No results for input(s): "VITAMINB12", "FOLATE", "FERRITIN", "TIBC", "IRON", "RETICCTPCT" in the last 72 hours.  Urine analysis:    Component  Value Date/Time   COLORURINE STRAW (A) 11/28/2020 1055   APPEARANCEUR CLEAR (A) 11/28/2020 1055   APPEARANCEUR Clear 01/03/2012 1117   LABSPEC 1.008 11/28/2020 1055   LABSPEC 1.004 01/03/2012 1117   PHURINE 7.0 11/28/2020 1055   GLUCOSEU 50 (A) 11/28/2020 1055  GLUCOSEU 100 (A) 01/21/2020 1223   HGBUR NEGATIVE 11/28/2020 1055   BILIRUBINUR NEGATIVE 11/28/2020 1055   BILIRUBINUR neg 07/30/2013 1127   BILIRUBINUR Negative 01/03/2012 1117   KETONESUR NEGATIVE 11/28/2020 1055   PROTEINUR 30 (A) 11/28/2020 1055   UROBILINOGEN 0.2 01/21/2020 1223   NITRITE NEGATIVE 11/28/2020 1055   LEUKOCYTESUR NEGATIVE 11/28/2020 1055   LEUKOCYTESUR Negative 01/03/2012 1117    Sepsis Labs: Lactic Acid, Venous    Component Value Date/Time   LATICACIDVEN 0.8 08/19/2022 0929    MICROBIOLOGY: Recent Results (from the past 240 hour(s))  Culture, blood (Routine X 2) w Reflex to ID Panel     Status: None (Preliminary result)   Collection Time: 08/19/22  9:29 AM   Specimen: BLOOD LEFT HAND  Result Value Ref Range Status   Specimen Description BLOOD LEFT HAND  Final   Special Requests   Final    BOTTLES DRAWN AEROBIC AND ANAEROBIC Blood Culture adequate volume   Culture   Final    NO GROWTH 4 DAYS Performed at [facility], [place] [address], [place], [place] [address]    Report Status PENDING  Incomplete  Culture, blood (Routine X 2) w Reflex to ID Panel     Status: None (Preliminary result)   Collection Time: 08/19/22  9:34 AM   Specimen: BLOOD LEFT HAND  Result Value Ref Range Status   Specimen Description BLOOD LEFT HAND  Final   Special Requests   Final    BOTTLES DRAWN AEROBIC AND ANAEROBIC Blood Culture adequate volume   Culture   Final    NO GROWTH 4 DAYS Performed at [facility], [place] [address], [place], [place] [address]    Report Status PENDING  Incomplete    RADIOLOGY STUDIES/RESULTS: No results found.   LOS: 6 days   Oren Binet, MD  [facility]    To contact the attending provider between 7A-7P or the covering provider during after hours 7P-7A, please log into the web site [email or web address] and access using universal [place] password for that web site. If you do not have the password, please call the hospital operator.  08/23/2022, 10:27 AM

## 2022-08-23 NOTE — Progress Notes (Signed)
Physical Therapy Treatment Patient Details Name: [name] MRN: [id number] DOB: 22-Feb-1946 Today's Date: 08/23/2022   History of Present Illness Pt is a 77 y/o M admitted to [facility] from [facility] on 3/25 after a fall resulting in nondisplaced L 7th and 8th rib fractures, seen on chest CT. Imaging also revealed free air in abdomen. PMHx: CHF, A-fib, ESRD on peritoneal dialysis, HTN, HLD, CVA    PT Comments    Patient not feeling well today. Pt initially sleepy and then drowsy throughout session. Barely able to talk due to dryness in mouth. Noted to have coughing with mouth hygiene, RN notified. Requires more assist for mobility today- Mod A for bed mobility and min-Mod A for standing. Only able to take some steps in room to chair for linen change with Min A and use of RW. Reports dizziness, SOB and weakness asking to return to bed. Sp02 dropped to mid 80s on 3L/min 02 [place]. Due to downgrade in status and overall mobility, discharge recommendation updated to post acute rehab to maximize independence and mobility prior to return home. Pt agreeable. SW and MD notified. Will follow.   Recommendations for follow up therapy are one component of a multi-disciplinary discharge planning process, led by the attending physician.  Recommendations may be updated based on patient status, additional functional criteria and insurance authorization.  Follow Up Recommendations  Can patient physically be transported by private vehicle: Yes    Assistance Recommended at Discharge Frequent or constant Supervision/Assistance  Patient can return home with the following A little help with walking and/or transfers;Assist for transportation;Help with stairs or ramp for entrance;A little help with bathing/dressing/bathroom   Equipment Recommendations  None recommended by PT    Recommendations for Other Services       Precautions / Restrictions Precautions Precautions: Fall;Other (comment) Precaution  Comments: L 7/8 rib fracture and maybe 12th, peritoneal dialysis; monitor O2 Restrictions Weight Bearing Restrictions: No     Mobility  Bed Mobility Overal bed mobility: Needs Assistance Bed Mobility: Rolling, Sidelying to Sit, Sit to Supine Rolling: Supervision Sidelying to sit: Mod assist, HOB elevated     Sit to sidelying: Min assist General bed mobility comments: rolling towards right, Mod A for trunk and scooting bottom to EOB using rail for support. Increased time/pain on left side due to rib fxs.    Transfers Overall transfer level: Needs assistance Equipment used: Rolling walker (2 wheels) Transfers: Sit to/from Stand Sit to Stand: Min assist, Mod assist           General transfer comment: Min-Mod A to stand from different surface heights with cues for hand placement/technique. Stood from [employer], from chair x1.    Ambulation/Gait Ambulation/Gait assistance: Min assist Gait Distance (Feet): 7 Feet Assistive device: Rolling walker (2 wheels) Gait Pattern/deviations: Step-through pattern, Decreased stride length, Narrow base of support Gait velocity: decreased     General Gait Details: Able to take a few steps to get to chair with Min A for balance And RW management. Sp02 dropped to mid 80s on 3L/min 02 [place]. Mild dizziness reported throughout.   Stairs             Wheelchair Mobility    Modified Rankin (Stroke Patients Only)       Balance Overall balance assessment: Needs assistance Sitting-balance support: Feet supported, No upper extremity supported Sitting balance-Leahy Scale: Fair     Standing balance support: During functional activity, Reliant on assistive device for balance, Bilateral upper extremity supported Standing  balance-Leahy Scale: Poor Standing balance comment: reliant on RW and external assist to maintain balance                            Cognition Arousal/Alertness: Lethargic Behavior During Therapy: Flat  affect Overall Cognitive Status: No family/caregiver present to determine baseline cognitive functioning                                 General Comments: Pt initially sleepy and drowsey during session; difficulty talking due to dry mouth. Slow processing.        Exercises      General Comments General comments (skin integrity, edema, etc.): Sp02 dropped to mid 80s on 3L/min 02 [place]. Lots of coughing esp with mouth hygiene using sponge. Barely able to speak due to dryness.      Pertinent Vitals/Pain Pain Assessment Pain Assessment: Faces Faces Pain Scale: Hurts even more Pain Location: left ribs Pain Descriptors / Indicators: Aching, Grimacing, Guarding, Sore Pain Intervention(s): Monitored during session, Repositioned, Limited activity within patient's tolerance    Home Living                          Prior Function            PT Goals (current goals can now be found in the care plan section) Progress towards PT goals: Not progressing toward goals - comment (due to lethargy, fatigue, weakness, SOB)    Frequency    Min 2X/week      PT Plan Discharge plan needs to be updated    Co-evaluation              AM-PAC PT "6 Clicks" Mobility   Outcome Measure  Help needed turning from your back to your side while in a flat bed without using bedrails?: A Little Help needed moving from lying on your back to sitting on the side of a flat bed without using bedrails?: A Lot Help needed moving to and from a bed to a chair (including a wheelchair)?: A Lot Help needed standing up from a chair using your arms (e.g., wheelchair or bedside chair)?: A Lot Help needed to walk in hospital room?: A Lot Help needed climbing 3-5 steps with a railing? : Total 6 Click Score: 12    End of Session Equipment Utilized During Treatment: Gait belt;Oxygen Activity Tolerance: Patient limited by fatigue;Patient limited by lethargy Patient left: in bed;with call  bell/phone within reach;with bed alarm set Nurse Communication: Mobility status;Other (comment) (coughing) PT Visit Diagnosis: Unsteadiness on feet (R26.81);History of falling (Z91.81);Difficulty in walking, not elsewhere classified (R26.2)     Time: [id number] PT Time Calculation (min) (ACUTE ONLY): 34 min  Charges:  $Therapeutic Activity: 23-37 mins                     Marisa Severin, PT, DPT Acute Rehabilitation Services Secure chat preferred Office [phone]      Marguarite Arbour A Charlet Harr 08/23/2022, 1:15 PM

## 2022-08-23 NOTE — Procedures (Signed)
Cortrak  Person Inserting Tube:  [name], RD Tube Type:  Cortrak - 43 inches Tube Size:  10 Tube Location:  Right nare Secured by: Bridle Technique Used to Measure Tube Placement:  Marking at nare/corner of mouth Cortrak Secured At:  65 cm   Cortrak Tube Team Note:  Consult received to place a Cortrak feeding tube.   X-ray is required, abdominal x-ray has been ordered by the Cortrak team. Please confirm tube placement before using the Cortrak tube.   If the tube becomes dislodged please keep the tube and contact the Cortrak team at [email or web address] for replacement.  If after hours and replacement cannot be delayed, place a NG tube and confirm placement with an abdominal x-ray.    Gustavus Bryant, MS, RD, LDN Inpatient Clinical Dietitian Please see [facility] for contact information.

## 2022-08-23 NOTE — Progress Notes (Signed)
PD treatment initiated with sterile technique. Consent is verfied in chart. Patient is alert and drowsy. Denies any complaints of pain. No specimen noted to be collected. PD exit site clean and applied a new drsg, no redness noted or drng, applied Gentamicin and new drsg per orders. Bedside RN is giving instruction on machine if it start to beep and how to contact  tech support.

## 2022-08-23 NOTE — Progress Notes (Signed)
Patient ID: [name], male   DOB: 1946-04-15, 77 y.o.   MRN: [id number] [facility] Progress Note   Assessment/ Plan:   1.  Mechanical fall with fracture of the left seventh/eighth and possibly 12th ribs: Originally transferred for suspicion of flail chest however this was ruled out by trauma surgery.  Ongoing efforts at symptom management. 2. ESRD: Continue peritoneal dialysis with cautious increase of ultrafiltration for volume to 1.5 L. Will use 2.5% as he appears on the dry side and BP low + net neg 2.6L during hospitalization. Will increase to 5 exchanges a night. If he tolerates then will increase to 1.8L tomorrow evening. Net 135mL UF but he's not eating or drinking.  If he's headed to a SNF then will almost certainly require temporary iHD as unlikely SNF would offer PD. 3. Anemia: Currently with stable hemoglobin/hematocrit after having coffee-ground emesis last week, concerns raised regarding Mallory-Weiss tear versus upper GI bleed with no immediate plans for EGD. 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  Switched from sevelamer to [place]. 5. Nutrition: Continue renal diet with fluid restriction/phosphorus restriction. 6.  Acute hypoxic respiratory failure: Suspected to be likely secondary to aspiration pneumonia in the setting of recurrent emesis/intolerance to PD fill volume.  Started on intravenous Unasyn with improvement of leukocytosis but continued dependency on oxygen supplementation/hypoxia noted.  Reattempt UF on PD.  Subjective:   Continues to have left chest discomfort with intermittent shortness of breath overnight requiring oxygen via nasal cannula.  Tolerated 1.5 L for volume with PD overnight with 4.25% and net UF 114mL.   Objective:   BP (!) 102/41 (BP Location: Left Arm)   Pulse (!) 46   Temp 97.8 F (36.6 C) (Oral)   Resp 10   Wt 67.8 kg   SpO2 (!) 81%   BMI 24.13 kg/m    Physical Exam: Gen: Fatigued resting in bed and easily arousable. On oxygen via nasal cannula CVS: Pulse regular rhythm, normal rate, S1 and S2 normal Resp: Poor inspiratory effort with decreased breath sounds over bases, no rales/rhonchi Abd: Soft, slightly distended, bowel sounds normal Ext: No lower extremity edema  Labs: BMET Recent Labs  Lab 08/16/22 2116 08/17/22 0436 08/18/22 2140 08/19/22 0929 08/20/22 0041 08/21/22 0651 08/22/22 0825  NA 134* 138 137 136 135 135 135  K 5.0 5.1 4.7 4.9 4.8 4.9 5.2*  CL 101 110 102 103 101 99 100  CO2 24 23 24 22 22 23  21*  GLUCOSE 127* 96 203* 72 77 60* 92  BUN 48* 48* 49* 50* 52* 70* 75*  CREATININE 7.41* 7.42* 8.09* 8.18* 8.24* 8.79* 8.73*  CALCIUM 8.0* 7.8* 8.1* 7.5* 7.6* 7.9* 7.9*  PHOS  --  6.6*  --  6.5* 6.8* 7.3* 6.9*   CBC Recent Labs  Lab 08/16/22 2116 08/17/22 0436 08/18/22 2140 08/19/22 0929 08/19/22 1304 08/20/22 0041 08/20/22 0639 08/21/22 0651 08/22/22 0825  WBC 12.9*   < > 19.1* 16.0*  --   --  10.8* 9.5 11.5*  NEUTROABS 10.2*  --  17.3*  --   --   --   --   --   --   HGB 9.9*   < > 9.8* 9.1*   < > 8.4* 8.4* 8.9* 9.3*  HCT 30.2*   < > 29.0* 27.3*   < > 25.8* 25.7* 27.7* 28.1*  MCV 88.3   < > 86.6 87.8  --   --  88.0  88.5 87.3  PLT 165   < > 165 170  --   --  156 196 204   < > = values in this interval not displayed.      Medications:     amiodarone  200 mg Oral Daily   amLODipine  5 mg Oral Daily   bisoprolol  5 mg Oral BID   Chlorhexidine Gluconate Cloth  6 each Topical Daily   cloNIDine  0.1 mg Transdermal Q Fri   doxazosin  8 mg Oral Daily   ferric citrate  420 mg Oral TID WC   gentamicin cream  1 Application Topical Daily   lidocaine  1 patch Transdermal QHS   losartan  50 mg Oral Daily   nicotine  21 mg Transdermal Daily   pantoprazole  40 mg Intravenous Q12H

## 2022-08-23 NOTE — Progress Notes (Signed)
Occupational Therapy Treatment Patient Details Name: [name] MRN: [id number] DOB: [birth year] Today's Date: 08/23/2022   History of present illness Pt is a 77 y/o M admitted to [facility] from [facility] on 3/25 after a fall resulting in nondisplaced L 7th and 8th rib fractures, seen on chest CT. Imaging also revealed free air in abdomen. PMHx: CHF, A-fib, ESRD on peritoneal dialysis, HTN, HLD, CVA   OT comments  Patient demonstrated limited progress this treatment session due to not feeling well and lethargic. Patient requiring increased assistance with bed mobility and transfers. Patient requiring cues for safety and increased time to follow commands. Patient would benefit from further rehab following discharge from acute care to address increasing safety and independence with self care tasks and functional transfers. Acute OT to continue to follow.    Recommendations for follow up therapy are one component of a multi-disciplinary discharge planning process, led by the attending physician.  Recommendations may be updated based on patient status, additional functional criteria and insurance authorization.    Assistance Recommended at Discharge Frequent or constant Supervision/Assistance  Patient can return home with the following  A lot of help with bathing/dressing/bathroom;Assistance with cooking/housework;Help with stairs or ramp for entrance;Assist for transportation;A lot of help with walking and/or transfers   Equipment Recommendations  Other (comment) (defer)    Recommendations for Other Services      Precautions / Restrictions Precautions Precautions: Fall;Other (comment) Precaution Comments: L 7/8 rib fracture and maybe 12th, peritoneal dialysis; monitor O2 Restrictions Weight Bearing Restrictions: No       Mobility Bed Mobility Overal bed mobility: Needs Assistance Bed Mobility: Sidelying to Sit, Sit to Sidelying, Rolling Rolling: Supervision Sidelying to sit: Mod  assist, HOB elevated     Sit to sidelying: Mod assist General bed mobility comments: required assistance to elevate trunk to get to EOB and to scoot to EOB. Patient required assistance with BLEs to return to sidelying    Transfers Overall transfer level: Needs assistance Equipment used: Rolling walker (2 wheels) Transfers: Sit to/from Stand, Bed to chair/wheelchair/BSC Sit to Stand: Mod assist     Step pivot transfers: Mod assist     General transfer comment: performed transfer from EOB to chair and back to simulate toilet transfers with patient requiring assistance to power up and for turning     Balance Overall balance assessment: Needs assistance Sitting-balance support: Feet supported, No upper extremity supported Sitting balance-Leahy Scale: Fair Sitting balance - Comments: able to maintain sitting balance on EOB with supervision for safety   Standing balance support: During functional activity, Reliant on assistive device for balance, Bilateral upper extremity supported Standing balance-Leahy Scale: Poor Standing balance comment: reliant on external support for balance                           ADL either performed or assessed with clinical judgement   ADL Overall ADL's : Needs assistance/impaired     Grooming: Wash/dry hands;Wash/dry face;Minimal assistance;Sitting Grooming Details (indicate cue type and reason): attempted while standing with patient demonstrating unsafe balance.                 Toilet Transfer: Moderate assistance Toilet Transfer Details (indicate cue type and reason): simulated to chair with cues for hand placement and safety           General ADL Comments: patient requiring increased assistance with self care tasks    Extremity/Trunk Assessment  Vision       Perception     Praxis      Cognition Arousal/Alertness: Lethargic Behavior During Therapy: Flat affect Overall Cognitive Status: No  family/caregiver present to determine baseline cognitive functioning                                 General Comments: patient with difficulty maintaining arousal while seated on EOB. slow processing, difficult to understand        Exercises      Shoulder Instructions       General Comments patient on 3 liters of O2 and was in low 90's at rest on EOB and would drop to mid 80's when standing and during transfer.    Pertinent Vitals/ Pain       Pain Assessment Pain Assessment: Faces Faces Pain Scale: Hurts even more Pain Location: left ribs Pain Descriptors / Indicators: Aching, Grimacing, Guarding, Sore Pain Intervention(s): Limited activity within patient's tolerance, Monitored during session, Repositioned  Home Living                                          Prior Functioning/Environment              Frequency  Min 2X/week        Progress Toward Goals  OT Goals(current goals can now be found in the care plan section)  Progress towards OT goals: Not progressing toward goals - comment (requiring increased assistance for bed mobility and transfers)  Acute Rehab OT Goals Patient Stated Goal: none stated OT Goal Formulation: With patient Time For Goal Achievement: 09/01/22 Potential to Achieve Goals: Good ADL Goals Pt Will Perform Grooming: with supervision;standing Pt Will Perform Lower Body Dressing: with min assist;sit to/from stand Pt Will Transfer to Toilet: with min guard assist;ambulating;regular height toilet;grab bars Pt Will Perform Toileting - Clothing Manipulation and hygiene: with min guard assist;sit to/from stand Additional ADL Goal #1: pt will be S in and OOB for basic ADLs  Plan Discharge plan remains appropriate    Co-evaluation                 AM-PAC OT "6 Clicks" Daily Activity     Outcome Measure   Help from another person eating meals?: A Little Help from another person taking care of personal  grooming?: A Little Help from another person toileting, which includes using toliet, bedpan, or urinal?: A Lot Help from another person bathing (including washing, rinsing, drying)?: A Lot Help from another person to put on and taking off regular upper body clothing?: A Lot Help from another person to put on and taking off regular lower body clothing?: A Lot 6 Click Score: 14    End of Session Equipment Utilized During Treatment: Rolling walker (2 wheels);Oxygen (3 liters)  OT Visit Diagnosis: Unsteadiness on feet (R26.81);Other abnormalities of gait and mobility (R26.89);Pain Pain - Right/Left: Left Pain - part of body:  (ribs)   Activity Tolerance Patient limited by lethargy   Patient Left in bed;with call bell/phone within reach;with bed alarm set   Nurse Communication Mobility status        Time: [id number] OT Time Calculation (min): 21 min  Charges: OT General Charges $OT Visit: 1 Visit OT Treatments $Self Care/Home Management : 8-22 mins  Lodema Hong, [place]  Office [phone]   Trixie Dredge 08/23/2022, 2:43 PM

## 2022-08-23 NOTE — Progress Notes (Signed)
Initial Nutrition Assessment  DOCUMENTATION CODES:   Non-severe (moderate) malnutrition in context of chronic illness  INTERVENTION:   Renal Multivitamin w/ minerals daily Enteral Nutrition via Cortrak: Start Osmolite 1.5 at 20 mL/hr and advance by 10 mL every 12 hours to goal rate of 50 mL/hr (1200 mL per day) 60 mL ProSource TF20 - daily Free water flush: 100 mL - TID (per MD) Tube feeds provides 1880 kcal, 95 gm protein, and 914 mL free water daily. Monitor magnesium, potassium, and phosphorus BID for at least 3 days, MD to replete as needed, as pt is at risk for refeeding syndrome given malnutrition and poor PO x 5 days. Recommend initiating bowel regimen due to no BM > 7 days.   NUTRITION DIAGNOSIS:   Moderate Malnutrition related to chronic illness as evidenced by severe muscle depletion, mild fat depletion.  GOAL:   Patient will meet greater than or equal to 90% of their needs  MONITOR:   Diet advancement, Labs, Weight trends, TF tolerance, I & O's  REASON FOR ASSESSMENT:   Consult Enteral/tube feeding initiation and management  ASSESSMENT:   77 y.o. male transferred from [facility] for trauma evaluation for possible flail chest. PMH includes HLD, PAD, ESRD on PD, CAD, CHF, CVA, and HTN. Pt with multiple rib fractures.   3/26 - Admitted; regular diet 3/28 - NPO 3/29 - diet advanced to full liquids 3/30 - NPO 3/31 - diet advanced to full liquids; NPO 4/01 - MBS, recommend NPO; Cortrak placed (x-ray pending)  Pt unable to provide RD with good nutrition history. Able to share that he typically eats 1 meal per day, usually supper. Reports a UBW of 143#, unable to provide if experienced any weight changes. Pt currently above EDW per chart. Per Nephrology note, changes have been made to home PD regimen.   Per chart, pt with no BM since 3/24, RN unsure if accurate. RD to reach out to MD to start bowel regimen.   Home PD prescription: 2L fills, 5 exchanges, 1.5 hr dwell.  EDW: 57.5 kg - Estimated calories absorbed: 200-260   Medications reviewed and include: Ferric Citrate, Protonix Labs reviewed: Sodium 136, Potassium 4.3, BUN 75, Creatinine 8.61, Phosphorus 6.9  UOP: 342 mL x 24 hours   NUTRITION - FOCUSED PHYSICAL EXAM:  Flowsheet Row Most Recent Value  Orbital Region Mild depletion  Upper Arm Region Severe depletion  Thoracic and Lumbar Region Moderate depletion  Buccal Region Mild depletion  Temple Region No depletion  Clavicle Bone Region Mild depletion  Clavicle and Acromion Bone Region Mild depletion  Scapular Bone Region Mild depletion  Dorsal Hand No depletion  Patellar Region Severe depletion  Anterior Thigh Region Severe depletion  Posterior Calf Region Severe depletion  Edema (RD Assessment) None  Hair Reviewed  Eyes Reviewed  Mouth Reviewed  Skin Reviewed  Nails Reviewed   Diet Order:   Diet Order             Diet NPO time specified  Diet effective now                   EDUCATION NEEDS:   Not appropriate for education at this time  Skin:  Skin Assessment: Reviewed RN Assessment  Last BM:  3/24  Height:  Ht Readings from Last 1 Encounters:  08/23/22 5\' 6"  (1.676 m)   Weight:  Wt Readings from Last 1 Encounters:  08/23/22 67.8 kg   Ideal Body Weight:  64.6 kg  BMI:  Body mass  index is 24.13 kg/m.  Estimated Nutritional Needs:  Kcal:  1900-2100 Protein:  95-115 grams Fluid:  1L + UOP   Hermina Barters RD, LDN Clinical Dietitian See [facility] for contact information.

## 2022-08-23 NOTE — Progress Notes (Signed)
Modified Barium Swallow Study  Patient Details  Name: [name] MRN: [id number] Date of Birth: [birth year]  Today's Date: 08/23/2022  Modified Barium Swallow completed.  Full report located under Chart Review in the Imaging Section.  History of Present Illness Pt is a 77 y.o. male who presented after a fall. Pt with nondisplaced L 7th and 8th rib fractures. GI consulted due to coffee-ground emesis, and nausea. Hematemesis thought to be due to Mallory-Weiss tear which resolved and EGD thought not be needed and GI signed off 3/31. SLP consulted 3/31 due to RN's report of pt having difficulty initiating the swallow and coughing thereafter. Pt was on full liquids since 3/29, but has been cleared for advancement by Dr. Nena Torres. PMH: CHF, A-fib, ESRD on peritoneal dialysis, hypertension, hyperlipidemia, CVA.   Clinical Impression Pt's overall swallow function was not marked by significant deficits, but mild deficits across many components of the swallow led to penetration and then aspiration of thin, nectar, honey, and potentially pureed consistencies.  He was uncomfortable during exam; voice and cough were extremely weak.  Trace penetration of materials to the vocal folds led to consistent weak coughing and he was unable to eject material from the larynx.  By the end of the exam, his fatigue precipitated oral suctioning of purees from mouth.  Until his mental status and functional reserve improve, he is unlikely to be able to consume enough POs by mouth nor have the stamina to tolerate even small degees of aspiration. He may benefit from a cortrak at this time.  NPO for now. Recommend allowing ice chips when he is sufficiently alert and able to f/c without delay. Factors that may increase risk of adverse event in presence of aspiration [name] & [name] 2021): Reduced cognitive function;Frail or deconditioned;Weak cough;Aspiration of thick, dense, and/or acidic materials  Swallow Evaluation  Recommendations Recommendations: NPO Oral care recommendations: Oral care QID (4x/day)      [name] [name] 08/23/2022,11:00 AM [name] L. Brett Ringer, MA CCC/SLP Clinical Specialist - [place] Office number [phone]

## 2022-08-23 NOTE — Progress Notes (Signed)
ANTICOAGULATION CONSULT NOTE - Follow Up Consult  Pharmacy Consult for Heparin Indication: atrial fibrillation  Allergies  Allergen Reactions   Irbesartan Other (See Comments)    hyperkalemia   Cymbalta [Duloxetine Hcl]    Hydralazine Other (See Comments)    headache   Imdur [Isosorbide Nitrate] Other (See Comments)    headache   Rosuvastatin     Breast swelling/soreness   Atorvastatin Other (See Comments)    Muscle pain   Bystolic [Nebivolol Hcl]     Extreme fatigue     Patient Measurements: Height: 5\' 6"  (167.6 cm) Weight: 67.8 kg (149 lb 7.6 oz) IBW/kg (Calculated) : 63.8 Heparin Dosing Weight: 68 kg  Vital Signs: Temp: 97.8 F (36.6 C) (04/01 0722) Temp Source: Oral (04/01 0722) BP: 121/41 (04/01 0722) Pulse Rate: 49 (04/01 0722)  Labs: Recent Labs    08/21/22 0651 08/22/22 0825 08/23/22 0750  HGB 8.9* 9.3* 10.0*  HCT 27.7* 28.1* 30.4*  PLT 196 204 225  HEPARINUNFRC  --   --  0.18*  CREATININE 8.79* 8.73* 8.61*    Estimated Creatinine Clearance: 6.6 mL/min (A) (by C-G formula based on SCr of 8.61 mg/dL (H)).  Assessment: Pt with a hx of afib who was on apixaban for anticoagulation. Apixaban has been on hold due to hematemesis, thought due to Mallory-Weiss tear, now resolved.  GI cleared to resume anticoagulation on 3/31. Heparin has been order for bridging with a low therapeutic goal. Last Eliquis dose PTA, 3/25 am.  Initial heparin level is subtherapeutic (0.18) on 1000 units/hr. Hgb improved. No bleeding reported.  Goal of Therapy:  Heparin level 0.3-0.5 units/ml, low therapeutic Monitor platelets by anticoagulation protocol: Yes   Plan:  Increase heparin drip to 1150 units/hr. Heparin level ~8 hrs after increase. Daily heparin level and CBC. Monitor for signs/symptoms of bleeding F/u resuming Apixaban when able.  Arty Baumgartner, [place] 08/23/2022,10:07 AM

## 2022-08-23 DEATH — deceased

## 2022-08-24 ENCOUNTER — Inpatient Hospital Stay (HOSPITAL_COMMUNITY): Payer: Medicare PPO

## 2022-08-24 DIAGNOSIS — Z7189 Other specified counseling: Secondary | ICD-10-CM

## 2022-08-24 DIAGNOSIS — N186 End stage renal disease: Secondary | ICD-10-CM | POA: Diagnosis not present

## 2022-08-24 DIAGNOSIS — Z515 Encounter for palliative care: Secondary | ICD-10-CM | POA: Diagnosis not present

## 2022-08-24 DIAGNOSIS — I10 Essential (primary) hypertension: Secondary | ICD-10-CM | POA: Diagnosis not present

## 2022-08-24 DIAGNOSIS — E440 Moderate protein-calorie malnutrition: Secondary | ICD-10-CM | POA: Insufficient documentation

## 2022-08-24 DIAGNOSIS — S2242XD Multiple fractures of ribs, left side, subsequent encounter for fracture with routine healing: Secondary | ICD-10-CM | POA: Diagnosis not present

## 2022-08-24 DIAGNOSIS — I2510 Atherosclerotic heart disease of native coronary artery without angina pectoris: Secondary | ICD-10-CM | POA: Diagnosis not present

## 2022-08-24 HISTORY — PX: IR US GUIDE VASC ACCESS RIGHT: IMG2390

## 2022-08-24 HISTORY — PX: IR FLUORO GUIDE CV LINE RIGHT: IMG2283

## 2022-08-24 LAB — CULTURE, BLOOD (ROUTINE X 2)
Culture: NO GROWTH
Culture: NO GROWTH
Special Requests: ADEQUATE
Special Requests: ADEQUATE

## 2022-08-24 LAB — CBC WITH DIFFERENTIAL/PLATELET
Abs Immature Granulocytes: 0.29 10*3/uL — ABNORMAL HIGH (ref 0.00–0.07)
Basophils Absolute: 0 10*3/uL (ref 0.0–0.1)
Basophils Relative: 0 %
Eosinophils Absolute: 0.4 10*3/uL (ref 0.0–0.5)
Eosinophils Relative: 3 %
HCT: 28.4 % — ABNORMAL LOW (ref 39.0–52.0)
Hemoglobin: 9.5 g/dL — ABNORMAL LOW (ref 13.0–17.0)
Immature Granulocytes: 3 %
Lymphocytes Relative: 10 %
Lymphs Abs: 1 10*3/uL (ref 0.7–4.0)
MCH: 29 pg (ref 26.0–34.0)
MCHC: 33.5 g/dL (ref 30.0–36.0)
MCV: 86.6 fL (ref 80.0–100.0)
Monocytes Absolute: 0.8 10*3/uL (ref 0.1–1.0)
Monocytes Relative: 7 %
Neutro Abs: 8.3 10*3/uL — ABNORMAL HIGH (ref 1.7–7.7)
Neutrophils Relative %: 77 %
Platelets: 200 10*3/uL (ref 150–400)
RBC: 3.28 MIL/uL — ABNORMAL LOW (ref 4.22–5.81)
RDW: 16 % — ABNORMAL HIGH (ref 11.5–15.5)
WBC: 10.7 10*3/uL — ABNORMAL HIGH (ref 4.0–10.5)
nRBC: 0.7 % — ABNORMAL HIGH (ref 0.0–0.2)

## 2022-08-24 LAB — GLUCOSE, CAPILLARY
Glucose-Capillary: 140 mg/dL — ABNORMAL HIGH (ref 70–99)
Glucose-Capillary: 91 mg/dL (ref 70–99)
Glucose-Capillary: 111 mg/dL — ABNORMAL HIGH (ref 70–99)
Glucose-Capillary: 117 mg/dL — ABNORMAL HIGH (ref 70–99)
Glucose-Capillary: 200 mg/dL — ABNORMAL HIGH (ref 70–99)

## 2022-08-24 LAB — RENAL FUNCTION PANEL
Albumin: <1.5 g/dL — ABNORMAL LOW (ref 3.5–5.0)
Anion gap: 11 (ref 5–15)
BUN: 76 mg/dL — ABNORMAL HIGH (ref 8–23)
CO2: 25 mmol/L (ref 22–32)
Calcium: 7.9 mg/dL — ABNORMAL LOW (ref 8.9–10.3)
Chloride: 101 mmol/L (ref 98–111)
Creatinine, Ser: 8.05 mg/dL — ABNORMAL HIGH (ref 0.61–1.24)
GFR, Estimated: 6 mL/min — ABNORMAL LOW (ref >60)
Glucose, Bld: 109 mg/dL — ABNORMAL HIGH (ref 70–99)
Phosphorus: 5.4 mg/dL — ABNORMAL HIGH (ref 2.5–4.6)
Potassium: 4 mmol/L (ref 3.5–5.1)
Sodium: 137 mmol/L (ref 135–145)

## 2022-08-24 LAB — MAGNESIUM
Magnesium: 1.9 mg/dL (ref 1.7–2.4)
Magnesium: 2 mg/dL (ref 1.7–2.4)

## 2022-08-24 LAB — PHOSPHORUS: Phosphorus: 5.5 mg/dL — ABNORMAL HIGH (ref 2.5–4.6)

## 2022-08-24 LAB — HEPARIN LEVEL (UNFRACTIONATED): Heparin Unfractionated: 0.23 IU/mL — ABNORMAL LOW (ref 0.30–0.70)

## 2022-08-24 MED ORDER — CEFAZOLIN SODIUM-DEXTROSE 2-4 GM/100ML-% IV SOLN
INTRAVENOUS | Status: AC | PRN
  Administered 2022-08-24: 2 g via INTRAVENOUS

## 2022-08-24 MED ORDER — LIDOCAINE-EPINEPHRINE 1 %-1:100000 IJ SOLN
INTRAMUSCULAR | Status: AC
  Filled 2022-08-24: qty 1

## 2022-08-24 MED ORDER — CEFAZOLIN SODIUM-DEXTROSE 2-4 GM/100ML-% IV SOLN
INTRAVENOUS | Status: AC
  Filled 2022-08-24: qty 100

## 2022-08-24 MED ORDER — FENTANYL CITRATE (PF) 100 MCG/2ML IJ SOLN
INTRAMUSCULAR | Status: AC
  Filled 2022-08-24: qty 2

## 2022-08-24 MED ORDER — GELATIN ABSORBABLE 12-7 MM EX MISC
CUTANEOUS | Status: AC
  Filled 2022-08-24: qty 1

## 2022-08-24 MED ORDER — HEPARIN SODIUM (PORCINE) 1000 UNIT/ML IJ SOLN
INTRAMUSCULAR | Status: AC
  Filled 2022-08-24: qty 10

## 2022-08-24 MED ORDER — CEFAZOLIN SODIUM-DEXTROSE 2-4 GM/100ML-% IV SOLN: 2.0000 g | INTRAVENOUS | Status: AC

## 2022-08-24 MED ORDER — POLYETHYLENE GLYCOL 3350 17 G PO PACK
17.0000 g | PACK | Freq: Two times a day (BID) | ORAL | Status: DC
  Administered 2022-08-24 – 2022-08-28 (×5): 17 g via ORAL
  Filled 2022-08-24 (×8): qty 1

## 2022-08-24 MED ORDER — DELFLEX-LC/4.25% DEXTROSE 483 MOSM/L IP SOLN: INTRAPERITONEAL | Status: DC

## 2022-08-24 MED ORDER — FENTANYL CITRATE PF 50 MCG/ML IJ SOSY
25.0000 ug | PREFILLED_SYRINGE | Freq: Once | INTRAMUSCULAR | Status: DC
  Filled 2022-08-24: qty 1

## 2022-08-24 MED ORDER — BISACODYL 10 MG RE SUPP: 10.0000 mg | Freq: Every day | RECTAL | Status: DC | PRN

## 2022-08-24 MED ORDER — FENTANYL CITRATE (PF) 100 MCG/2ML IJ SOLN
INTRAMUSCULAR | Status: AC | PRN
  Administered 2022-08-24 (×2): 25 ug via INTRAVENOUS

## 2022-08-24 MED ORDER — MIDAZOLAM HCL 2 MG/2ML IJ SOLN
INTRAMUSCULAR | Status: AC
  Filled 2022-08-24: qty 2

## 2022-08-24 MED ORDER — SENNOSIDES 8.8 MG/5ML PO SYRP
15.0000 mL | ORAL_SOLUTION | Freq: Two times a day (BID) | ORAL | Status: DC
  Administered 2022-08-24 – 2022-08-28 (×8): 15 mL via ORAL
  Filled 2022-08-24 (×15): qty 15

## 2022-08-24 MED ORDER — HEPARIN (PORCINE) 25000 UT/250ML-% IV SOLN
1400.0000 [IU]/h | INTRAVENOUS | Status: AC
  Administered 2022-08-24: 1400 [IU]/h via INTRAVENOUS
  Filled 2022-08-24: qty 250

## 2022-08-24 NOTE — Progress Notes (Signed)
Advised by nephrologist that pt will require transition to HD due to snf placement at d/c. Contacted [facility] (in [place]) and spoke to staff in the home therapies/PD clinic. Advised staff that pt will require temporary HD while at snf and snf is still to be determined. Will need to determine HD clinic placement based on snf placement. Update provided to CSW. Will assist with clinic placement once snf placement is determined.   Melven Sartorius Renal Navigator [phone]

## 2022-08-24 NOTE — Progress Notes (Addendum)
PROGRESS NOTE        PATIENT DETAILS Name: [name] Age: 77 y.o. Sex: male Date of Birth: [birth year] Admit Date: 08/17/2022 Admitting Physician Emilee Hero, MD [id number], Aris Everts, MD  Brief Summary: Patient is a 77 y.o.  male with history of ESRD on peritoneal dialysis, A-fib on Eliquis, HTN, HLD-who was transferred from [facility] for concern for a flail chest following a fall with rib fracture.  Hospital course complicated by intractable vomiting-leading to upper GI bleeding from presumed Mallory-Weiss tear, aspiration pneumonia, severe debility/deconditioning.  See below for further details.  Significant events: 3/25-3/26>> hospitalization at [facility] following a fall-concern for flail chest-transferred to [facility] for trauma evaluation. 3/27>> abdominal pain following PD vol fill. Numerous episodes of vomiting evening-into night-vomitus became coffee-ground-some intermittent hypoxia. NPO-PPI infusion started.Renal MD decreasing PD vol fill. 3/31>> Per GI-no EGD-okay to restart anticoagulation.  IV heparin started.  SLP eval-keep n.p.o. 4/01>>Cortrak tube inserted  Significant studies: 3/25>> CT chest: Nondisplaced left seventh/eighth rib fracture, possible 12th left rib fracture. 3/25>> CT abdomen/pelvis: Extensive pneumoperitoneum-in the setting of PD catheter.  6.4 cm left cystic lesion-suggestive of a hemorrhagic cyst.  Significant microbiology data: 3/28>> blood cultures: No growth  Procedures: None  Consults: Nephrology Trauma surgery GI  Subjective: Overall unchanged-continues to have pain in right chest area-specially when he takes deep breaths/coughs.  No abdominal pain.  Objective: Vitals: Blood pressure (!) 141/70, pulse 60, temperature 97.8 F (36.6 C), resp. rate 20, height 5\' 6"  (1.676 m), weight 69.6 kg, SpO2 93 %.   Exam: Gen Exam:Alert awake-not in any distress-family-chronically sick appearing. HEENT:atraumatic,  normocephalic Chest: B/L clear to auscultation anteriorly CVS:S1S2 regular Abdomen:soft non tender, non distended Extremities:no edema Neurology: Non focal-but moves all 4 extremities. Skin: no rash  Pertinent Labs/Radiology:    Latest Ref Rng & Units 08/24/2022    9:52 AM 08/23/2022    7:50 AM 08/22/2022    8:25 AM  CBC  WBC 4.0 - 10.5 K/uL 10.7  12.6  11.5   Hemoglobin 13.0 - 17.0 g/dL 9.5  10.0  9.3   Hematocrit 39.0 - 52.0 % 28.4  30.4  28.1   Platelets 150 - 400 K/uL 200  225  204     Lab Results  Component Value Date   NA 137 08/24/2022   K 4.0 08/24/2022   CL 101 08/24/2022   CO2 25 08/24/2022      Assessment/Plan: Mechanical fall with left seventh, eighth and possible 12th rib fracture Pain issues much better Continue prn Tylenol-minimize narcotics as much as possible pain Incentive amatory/pulmonary tolerating Mobilization with nursing staff/PT/OT.   Trauma team has signed off.   Intractable nausea/vomiting Upper GI bleed with coffee-ground emesis-?  Mallory-Weiss tear Vomiting has resolved-no vomiting for almost 5 days.  This was felt to be due to narcotics/excessive PD fill volume.  Narcotics were minimized-PD volume was adjusted by nephrology. Hemoglobin stable-not felt to be actively bleeding Per GI note on 3/31-no role for endoscopy as bleeding seems to have completely resolved IV heparin restarted-switch to Eliquis 4/2.  Acute hypoxic respiratory failure Multifactorial-initially due to rib fractures/splinting-then developed aspiration pneumonia-likely due to pulmonary edema in the setting of ineffective peritoneal dialysis. Stable on anywhere from 2-4 L of oxygen for the past several days  Continue to treat underlying etiologies is much as possible  Aspiration pneumonia Much improved  Leukocytosis improved. Cultures negative so far IV Unasyn x 5 days total completed 4/1  Oropharyngeal dysphagia Unfortunately has developed severe deconditioning-causing  oropharyngeal dysphagia-Cortrak tube placed on 4/1-hopefully we can get him optimized over the next few days and attempt to rechallenge with oral intake.  SLP following.  Left renal hemorrhagic cyst Incidental finding on CT abdomen Discussed with renal MD-no reason to continue holding Eliquis for this issue-as hemoglobin stable.  ESRD on peritoneal dialysis Hyperkalemia BUNs/creatinine remain on the higher side Nephrology following and adjusting PD regimen Since has developed severe debility/deconditioning due to acute illness-and will require SNF-he will need to be temporarily switched over to hemodialysis.  Nephrology has ordered IR to place a tunneled dialysis catheter.  Acute urinary retention  Occurred on 3/28 when patient had vomiting-extensive pain issues Foley catheter removed on 3/31  Continue to watch for urinary retention.  PAF Sinus rhythm on telemetry Continue amiodarone Eliquis initially on hold-IV heparin started 3/31-without any signs of rebleeding-switch to Eliquis 4/2.    Acute on chronic chronic HFrEF See above Volume removal with dialysis . CAD (LHC 06/30/2022-two-vessel disease-70-80% mid LAD stenosis, chronic total occlusion of RCA with left-right collaterals-optimization of medical therapy recommended) Not on antiplatelets as he is on Eliquis Intolerant to statin Currently without any anginal symptoms-his left-sided musculoskeletal chest pain is from rib fractures.  HTN BP reasonable Continue amlodipine/bisoprolol/clonidine/doxazosin and losartan.    Debility/deconditioning Likely due to acute illness-has had significant decline in functional status over the past several days PT/OT-now recommending SNF Multiple issues/setbacks during this hospitalization-now with severe failure to thrive-will ask palliative care to evaluate-remains a full code as of now.  Briefly discussed with patient-he is agreeable for palliative care  Nutrition Status: Nutrition  Problem: Moderate Malnutrition Etiology: chronic illness Signs/Symptoms: severe muscle depletion, mild fat depletion Interventions: Tube feeding, MVI    BMI: Estimated body mass index is 24.77 kg/m as calculated from the following:   Height as of this encounter: 5\' 6"  (1.676 m).   Weight as of this encounter: 69.6 kg.   Code status:   Code Status: Full Code   DVT Prophylaxis: Place TED hose Start: 08/17/22 2148   Family Communication: Yobany Valenzano [id number] 4/2   Disposition Plan: Status is: Inpatient Remains inpatient appropriate because: Severity of illness   Planned Discharge Destination:Home health   Diet: Diet Order             Diet NPO time specified  Diet effective now                     Antimicrobial agents: Anti-infectives (From admission, onward)    Start     Dose/Rate Route Frequency Ordered Stop   08/19/22 1100  Ampicillin-Sulbactam (UNASYN) 3 g in sodium chloride 0.9 % 100 mL IVPB        3 g 200 mL/hr over 30 Minutes Intravenous Every 24 hours 08/19/22 0957 08/23/22 1141        MEDICATIONS: Scheduled Meds:  amiodarone  200 mg Oral Daily   amLODipine  5 mg Oral Daily   bisoprolol  5 mg Oral BID   Chlorhexidine Gluconate Cloth  6 each Topical Daily   cloNIDine  0.1 mg Transdermal Q Fri   doxazosin  8 mg Oral Daily   feeding supplement (PROSource TF20)  60 mL Per Tube Daily   ferric citrate  420 mg Oral TID WC   free water  100 mL Per Tube TID   gentamicin cream  1 Application Topical  Daily   lidocaine  1 patch Transdermal QHS   losartan  50 mg Oral Daily   multivitamin  1 tablet Per Tube QHS   nicotine  21 mg Transdermal Daily   pantoprazole  40 mg Intravenous Q12H   polyethylene glycol  17 g Oral BID   sennosides  15 mL Oral BID   Continuous Infusions:  dialysis solution 4.25% low-MG/low-CA     feeding supplement (OSMOLITE 1.5 CAL) 1,000 mL (08/23/22 1721)   heparin 1,300 Units/hr (08/24/22 0046)   promethazine  (PHENERGAN) injection (IM or IVPB) Stopped (08/18/22 1826)   promethazine (PHENERGAN) injection (IM or IVPB)     PRN Meds:.acetaminophen, bisacodyl, fentaNYL (SUBLIMAZE) injection, lactulose, metoCLOPramide (REGLAN) injection, naLOXone (NARCAN)  injection, ondansetron **OR** ondansetron (ZOFRAN) IV, oxyCODONE, promethazine **OR** promethazine (PHENERGAN) injection (IM or IVPB) **OR** promethazine, promethazine (PHENERGAN) injection (IM or IVPB)   I have personally reviewed following labs and imaging studies  LABORATORY DATA: CBC: Recent Labs  Lab 08/18/22 2140 08/19/22 0929 08/20/22 0639 08/21/22 0651 08/22/22 0825 08/23/22 0750 08/24/22 0952  WBC 19.1*   < > 10.8* 9.5 11.5* 12.6* 10.7*  NEUTROABS 17.3*  --   --   --   --   --  8.3*  HGB 9.8*   < > 8.4* 8.9* 9.3* 10.0* 9.5*  HCT 29.0*   < > 25.7* 27.7* 28.1* 30.4* 28.4*  MCV 86.6   < > 88.0 88.5 87.3 87.9 86.6  PLT 165   < > 156 196 204 225 200   < > = values in this interval not displayed.     Basic Metabolic Panel: Recent Labs  Lab 08/20/22 0041 08/21/22 0651 08/22/22 0825 08/23/22 0750 08/23/22 2145 08/24/22 0952  NA 135 135 135 136  --  137  K 4.8 4.9 5.2* 4.3  --  4.0  CL 101 99 100 101  --  101  CO2 22 23 21* 23  --  25  GLUCOSE 77 60* 92 165*  --  109*  BUN 52* 70* 75* 75*  --  76*  CREATININE 8.24* 8.79* 8.73* 8.61*  --  8.05*  CALCIUM 7.6* 7.9* 7.9* 8.0*  --  7.9*  MG  --   --   --   --  2.0 1.9  PHOS 6.8* 7.3* 6.9* 6.9* 6.0* 5.4*     GFR: Estimated Creatinine Clearance: 7 mL/min (A) (by C-G formula based on SCr of 8.05 mg/dL (H)).  Liver Function Tests: Recent Labs  Lab 08/18/22 2140 08/19/22 0929 08/20/22 0041 08/21/22 [id number] 08/22/22 0825 08/23/22 0750 08/24/22 0952  AST 13*  --   --   --   --   --   --   ALT 13  --   --   --   --   --   --   ALKPHOS 122  --   --   --   --   --   --   BILITOT 0.6  --   --   --   --   --   --   PROT 4.9*  --   --   --   --   --   --   ALBUMIN 1.9*   < >  <1.5* <1.5* <1.5* 1.5* <1.5*   < > = values in this interval not displayed.    No results for input(s): "LIPASE", "AMYLASE" in the last 168 hours. No results for input(s): "AMMONIA" in the last 168 hours.  Coagulation Profile: No results for  input(s): "INR", "PROTIME" in the last 168 hours.   Cardiac Enzymes: No results for input(s): "CKTOTAL", "CKMB", "CKMBINDEX", "TROPONINI" in the last 168 hours.  BNP (last 3 results) No results for input(s): "PROBNP" in the last 8760 hours.  Lipid Profile: No results for input(s): "CHOL", "HDL", "LDLCALC", "TRIG", "CHOLHDL", "LDLDIRECT" in the last 72 hours.  Thyroid Function Tests: No results for input(s): "TSH", "T4TOTAL", "FREET4", "T3FREE", "THYROIDAB" in the last 72 hours.  Anemia Panel: No results for input(s): "VITAMINB12", "FOLATE", "FERRITIN", "TIBC", "IRON", "RETICCTPCT" in the last 72 hours.  Urine analysis:    Component Value Date/Time   COLORURINE STRAW (A) 11/28/2020 1055   APPEARANCEUR CLEAR (A) 11/28/2020 1055   APPEARANCEUR Clear 01/03/2012 1117   LABSPEC 1.008 11/28/2020 1055   LABSPEC 1.004 01/03/2012 1117   PHURINE 7.0 11/28/2020 1055   GLUCOSEU 50 (A) 11/28/2020 1055   GLUCOSEU 100 (A) 01/21/2020 1223   HGBUR NEGATIVE 11/28/2020 1055   BILIRUBINUR NEGATIVE 11/28/2020 1055   BILIRUBINUR neg 07/30/2013 1127   BILIRUBINUR Negative 01/03/2012 1117   KETONESUR NEGATIVE 11/28/2020 1055   PROTEINUR 30 (A) 11/28/2020 1055   UROBILINOGEN 0.2 01/21/2020 1223   NITRITE NEGATIVE 11/28/2020 1055   LEUKOCYTESUR NEGATIVE 11/28/2020 1055   LEUKOCYTESUR Negative 01/03/2012 1117    Sepsis Labs: Lactic Acid, Venous    Component Value Date/Time   LATICACIDVEN 0.8 08/19/2022 0929    MICROBIOLOGY: Recent Results (from the past 240 hour(s))  Culture, blood (Routine X 2) w Reflex to ID Panel     Status: None   Collection Time: 08/19/22  9:29 AM   Specimen: BLOOD LEFT HAND  Result Value Ref Range Status   Specimen  Description BLOOD LEFT HAND  Final   Special Requests   Final    BOTTLES DRAWN AEROBIC AND ANAEROBIC Blood Culture adequate volume   Culture   Final    NO GROWTH 5 DAYS Performed at [facility], Home [address] [place], [place] [address]    Report Status 08/24/2022 FINAL  Final  Culture, blood (Routine X 2) w Reflex to ID Panel     Status: None   Collection Time: 08/19/22  9:34 AM   Specimen: BLOOD LEFT HAND  Result Value Ref Range Status   Specimen Description BLOOD LEFT HAND  Final   Special Requests   Final    BOTTLES DRAWN AEROBIC AND ANAEROBIC Blood Culture adequate volume   Culture   Final    NO GROWTH 5 DAYS Performed at [facility], [place] [address] [place], [place] [address]    Report Status 08/24/2022 FINAL  Final    RADIOLOGY STUDIES/RESULTS: DG Abd Portable 1V  Result Date: 08/23/2022 CLINICAL DATA:  Encounter for feeding tube placement EXAM: PORTABLE ABDOMEN - 1 VIEW COMPARISON:  Portable exam 1420 hours compared to 08/19/2022 FINDINGS: Tip of feeding tube projects over distal gastric antrum. Enlargement of cardiac silhouette. Atherosclerotic calcification and tortuosity of thoracic aorta. Bibasilar pulmonary infiltrates and RIGHT pleural effusion. Visualized bowel gas pattern in upper abdomen unremarkable. IMPRESSION: Tip of feeding tube projects over distal gastric antrum. Electronically Signed   By: Lavonia Dana M.D.   On: 08/23/2022 15:10   DG Swallowing Func-Speech Pathology  Result Date: 08/23/2022 Table formatting from the original result was not included. Modified Barium Swallow Study Patient Details Name: [name] MRN: [id number] Date of Birth: [birth year] Today's Date: 08/23/2022 HPI/PMH: HPI: Pt is a 77 y.o. male who presented after a fall. Pt with nondisplaced L 7th  and 8th rib fractures. GI consulted due to coffee-ground emesis, and nausea. Hematemesis thought to be due to Mallory-Weiss tear which resolved and EGD thought not be needed and GI signed  off 3/31. SLP consulted 3/31 due to RN's report of pt having difficulty initiating the swallow and coughing thereafter. Pt was on full liquids since 3/29, but has been cleared for advancement by Dr. Nena Alexander. PMH: CHF, A-fib, ESRD on peritoneal dialysis, hypertension, hyperlipidemia, CVA. Clinical Impression: Clinical Impression: Pt's overall swallow function was not marked by significant deficits, but mild deficits across many components of the swallow led to penetration and then aspiration of thin, nectar, honey, and potentially pureed consistencies.  He was uncomfortable during exam; voice and cough were extremely weak.  Trace penetration of materials to the vocal folds led to consistent weak coughing but he was unable to eject material from the larynx.  By the end of the exam, his fatigue precipitated oral suctioning of purees from mouth.  Until his mental status and functional reserve improve, he is unlikely to be able to consume enough POs by mouth nor have the stamina to tolerate even small degees of aspiration. He may benefit from a cortrak at this time.  NPO for now. Recommend allowing ice chips when he is sufficiently alert and able to f/c without delay. Factors that may increase risk of adverse event in presence of aspiration (Lamar 2021): Factors that may increase risk of adverse event in presence of aspiration (Irene 2021): Reduced cognitive function; Frail or deconditioned; Weak cough; Aspiration of thick, dense, and/or acidic materials Recommendations/Plan: Swallowing Evaluation Recommendations Swallowing Evaluation Recommendations Recommendations: NPO Oral care recommendations: Oral care QID (4x/day) Treatment Plan Treatment Plan Treatment recommendations: Therapy as outlined in treatment plan below Treatment frequency: Min 2x/week Treatment duration: 2 weeks Interventions: Oropharyngeal exercises; Patient/family education; Trials of upgraded texture/liquids; Diet toleration  management by SLP Recommendations Recommendations for follow up therapy are one component of a multi-disciplinary discharge planning process, led by the attending physician.  Recommendations may be updated based on patient status, additional functional criteria and insurance authorization. Assessment: Orofacial Exam: Orofacial Exam Oral Cavity - Dentition: Adequate natural dentition Anatomy: Anatomy: WFL Boluses Administered: Boluses Administered Boluses Administered: Thin liquids (Level 0); Mildly thick liquids (Level 2, nectar thick); Moderately thick liquids (Level 3, honey thick); Puree  Oral Impairment Domain: Oral Impairment Domain Lip Closure: No labial escape Tongue control during bolus hold: Cohesive bolus between tongue to palatal seal Bolus preparation/mastication: -- (NT) Bolus transport/lingual motion: Delayed initiation of tongue motion (oral holding) Oral residue: Residue collection on oral structures Location of oral residue : Tongue; Lateral sulci Initiation of pharyngeal swallow : Pyriform sinuses  Pharyngeal Impairment Domain: Pharyngeal Impairment Domain Soft palate elevation: No bolus between soft palate (SP)/pharyngeal wall (PW) Laryngeal elevation: Complete superior movement of thyroid cartilage with complete approximation of arytenoids to epiglottic petiole Anterior hyoid excursion: Complete anterior movement Epiglottic movement: Complete inversion Laryngeal vestibule closure: Incomplete, narrow column air/contrast in laryngeal vestibule Pharyngeal stripping wave : Present - diminished Pharyngeal contraction (A/P view only): N/A Pharyngoesophageal segment opening: Partial distention/partial duration, partial obstruction of flow Tongue base retraction: Trace column of contrast or air between tongue base and PPW Pharyngeal residue: Trace residue within or on pharyngeal structures Location of pharyngeal residue: Valleculae; Pharyngeal wall; Pyriform sinuses  Esophageal Impairment Domain: No data  recorded Pill: No data recorded Penetration/Aspiration Scale Score: Penetration/Aspiration Scale Score 5.  Material enters airway, CONTACTS cords and not ejected out: Thin  liquids (Level 0); Mildly thick liquids (Level 2, nectar thick); Moderately thick liquids (Level 3, honey thick); Puree 7.  Material enters airway, passes BELOW cords and not ejected out despite cough attempt by patient: Thin liquids (Level 0); Mildly thick liquids (Level 2, nectar thick); Moderately thick liquids (Level 3, honey thick) Compensatory Strategies: Compensatory Strategies Compensatory strategies: No   General Information: Caregiver present: No  Diet Prior to this Study: NPO   Temperature : Normal   No data recorded  Supplemental O2: Nasal cannula   History of Recent Intubation: No  Behavior/Cognition: Confused; Lethargic/Drowsy No data recorded Baseline vocal quality/speech: Hypophonia/low volume Volitional Cough: Able to elicit Volitional Swallow: Unable to elicit Exam Limitations: Limited visibility Goal Planning: Prognosis for improved oropharyngeal function: Good Barriers to Reach Goals: Cognitive deficits No data recorded Patient/Family Stated Goal: For him to improve Consulted and agree with results and recommendations: Pt unable/family or caregiver not available Pain: Pain Assessment Pain Assessment: No/denies pain Pain Score: 10 Faces Pain Scale: 4 Breathing: 1 Negative Vocalization: 1 Facial Expression: 1 Body Language: 1 Consolability: 1 PAINAD Score: 5 Facial Expression: 2 Body Movements: 1 Muscle Tension: 1 Compliance with ventilator (intubated pts.): N/A Vocalization (extubated pts.): 0 CPOT Total: 4 Pain Location: ribs Pain Descriptors / Indicators: Aching; Grimacing; Guarding Pain Intervention(s): Monitored during session; Limited activity within patient's tolerance End of Session: Start Time:SLP Start Time (ACUTE ONLY): 0940 Stop Time: SLP Stop Time (ACUTE ONLY): 1005 Time Calculation:SLP Time Calculation (min) (ACUTE  ONLY): 25 min Charges: SLP Evaluations $ SLP Speech Visit: 1 Visit SLP Evaluations $BSS Swallow: 1 Procedure $MBS Swallow: 1 Procedure SLP visit diagnosis: SLP Visit Diagnosis: Dysphagia, oropharyngeal phase (R13.12) Past Medical History: Past Medical History: Diagnosis Date  Acute on chronic systolic congestive heart failure (HCC)   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 12/2018 Echo: EF 40-45%, global HK. Asc Ao 3.7cm; b. 01/2019 Lexi MV: small, mild, fixed basal and mid antlat defect - scar vs artifact. Small, mild mid and apical inf minimally reversible defect, likely scar w/ peri-infarct ischemia. Coronary and Ao atherosclerosis; c. 12/2019 Echo: EF 40-45%, glob HK, Gr1 DD. Nl RV fxn. Sev dil LA. Mild MR. Mild-mod Ao sclerosis w/o stenosis. Asc Ao 14mm.  Carotid arterial disease ([place])   a. 01/2020 RICA 1-39%, RCCA/RECA < [id number], LICA [id number], LCCA/LECA <50%.  Claudication ([place])   a. 02/2020 ABI/TBI: R 1.08/0.95, L 1.00/0.70.  Complication of anesthesia   had to be woken up slowly as his bp was elevated when did this quickly  Diabetes mellitus without complication (HCC)   Dysplastic nevus 02/07/2013  L upper back, moderate atypia  ESRD (end stage renal disease) ([place])   a. Peritoneal Dialysis pt.  Hyperlipidemia   Hypertension   Migraine   cluster  Neuromuscular disorder ([place])   left lower extrem neuropathy  Obstructive sleep apnea   no OSA since had facial surgery with dr. Kathyrn Sheriff in 1997  PAD (peripheral artery disease) ([place]) 06/2007  nonobstructing, renal angiogram ([place])  Renal cell carcinoma 2004  left kidney heminephrectomy  Renal insufficiency   Sepsis ([place]) 11/06/2020  Skin cancer   R dorsum hand  Stroke ([place])   a. 12/2019 MRI: Small acute infarcts involving the left cerebral hemisphere and several chronic infarcts.  TIA (transient ischemic attack)   no residual but left leg and foot  still feel heavy  tobacco abuse  Past Surgical  History: Past Surgical History: Procedure Laterality Date  CAPD INSERTION N/A 03/01/2019  Procedure: LAPAROSCOPIC INSERTION CONTINUOUS AMBULATORY PERITONEAL DIALYSIS  (CAPD) CATHETER;  Surgeon: Algernon Huxley, MD;  Location: [facility] ORS;  Service: Vascular;  Laterality: N/A;  CARDIAC CATHETERIZATION    Dr. [name] did this to assess his renal artery  cyst removal  12/25/2015  Spine L4 and L5  heminephrectomy  2004  for renal cell CA  RENAL CRYOABLATION  Jan 2006  right kidney,  [facility]  RIGHT HEART CATH AND CORONARY ANGIOGRAPHY Bilateral 06/29/2022  Procedure: RIGHT HEART CATH AND CORONARY ANGIOGRAPHY;  Surgeon: [name], MD;  Location: [facility];  Service: Cardiovascular;  Laterality: Bilateral;  sciatica   Assunta Curtis 08/23/2022, 11:02 AM    LOS: 7 days   Oren Binet, MD  [name]    To contact the attending provider between 7A-7P or the covering provider during after hours 7P-7A, please log into the web site [email or web address] and access using universal [place] password for that web site. If you do not have the password, please call the hospital operator.  08/24/2022, 10:59 AM

## 2022-08-24 NOTE — Procedures (Signed)
Interventional Radiology Procedure Note  Procedure: RT IJ TUNNELED HD CATH    Complications: None  Estimated Blood Loss:  MIN  Findings: TIP SVCRA    M. TREVOR Mcihael Hinderman, MD    

## 2022-08-24 NOTE — Progress Notes (Signed)
Speech Language Pathology Treatment: Dysphagia  Patient Details Name: [name] MRN: [id number] DOB: [birth year] Today's Date: 08/24/2022 Time: 1020-1040 SLP Time Calculation (min) (ACUTE ONLY): 20 min  Assessment / Plan / Recommendation Clinical Impression  Patient seen by SLP for skilled treatment focused on dysphagia goals. When SLP entered room, patient awake, alert, seems somewhat uncomfortable and is holding incentive spirometer but unable to use. SLP adjusted HOB to more upright and provided oral care. Patient's oral mucosa was very dry but no significant amount of secretions present. Patient was then receptive to some ice chips with SLP administering one at a time. Mildly prolonged bolus transit and suspected swallow initiation delay but no immediate overt s/s aspiration or penetration. Patient then proceeded to have productive cough, expectorating thick yellow-green secretions into emesis bag. Patient's cough and voice were hoarse sounding. Unfortunately, he is not yet ready for PO trials. Recommendation continues to be NPO with PRN ice chips. SLP will continue to follow for PO readiness.   HPI HPI: Pt is a 77 y.o. male who presented after a fall. Pt with nondisplaced L 7th and 8th rib fractures. GI consulted due to coffee-ground emesis, and nausea. Hematemesis thought to be due to Mallory-Weiss tear which resolved and EGD thought not be needed and GI signed off 3/31. SLP consulted 3/31 due to RN's report of pt having difficulty initiating the swallow and coughing thereafter. Pt was on full liquids since 3/29, but has been cleared for advancement by Dr. Nena Torres. PMH: CHF, A-fib, ESRD on peritoneal dialysis, hypertension, hyperlipidemia, CVA.      SLP Plan  Continue with current plan of care      Recommendations for follow up therapy are one component of a multi-disciplinary discharge planning process, led by the attending physician.  Recommendations may be updated based on patient  status, additional functional criteria and insurance authorization.    Recommendations  Diet recommendations: NPO Medication Administration: Crushed with puree Postural Changes and/or Swallow Maneuvers: Seated upright 90 degrees                  Oral care QID;Staff/trained caregiver to provide oral care;Oral care prior to ice chip/H20   Intermittent Supervision/Assistance Dysphagia, oropharyngeal phase (R13.12)     Continue with current plan of care   Sonia Baller, MA, CCC-SLP Speech Therapy

## 2022-08-24 NOTE — Progress Notes (Signed)
Pre CCPD Vitals   08/24/22 1843  Vitals  Temp 98 F (36.7 C)  Temp Source Oral  BP (!) 151/52  MAP (mmHg) 81  BP Location Left Arm  BP Method Automatic  Patient Position (if appropriate) Lying  Pulse Rate (!) 51  ECG Heart Rate (!) 53  Oxygen Therapy  SpO2 99 %  O2 Device Nasal Cannula  O2 Flow Rate (L/min) 3 L/min  Time-Out for Dialysis  What Procedure? Peritoneal Dialysis  Pt Identifiers(min of two) First/Last Name;MRN/Account#;Pt's DOB(use if MRN/Acct# not available  Correct Site? Yes  Correct Side? Yes  Correct Procedure? Yes  Consents Verified? Yes  Rad Studies Available? N/A  Safety Precautions Reviewed? Yes

## 2022-08-24 NOTE — Progress Notes (Addendum)
ANTICOAGULATION CONSULT NOTE - Follow Up Consult  Pharmacy Consult for heparin Indication: atrial fibrillation  Labs: Recent Labs    08/22/22 0825 08/23/22 0750 08/23/22 2125 08/24/22 0950 08/24/22 0952  HGB 9.3* 10.0*  --   --  9.5*  HCT 28.1* 30.4*  --   --  28.4*  PLT 204 225  --   --  200  HEPARINUNFRC  --  0.18* 0.13* 0.23*  --   CREATININE 8.73* 8.61*  --   --  8.05*     Assessment: 76yo male subtherapeutic on heparin after rate change; no infusion issues or signs of bleeding per RN.  Goal of Therapy:  Heparin level 0.3-0.5 units/ml   Plan:  Will increase heparin infusion cautiously by 2 units/kg/hr to 1400 units/hr and check level in 8 hours.    Patient is going to IR for a tunneled HD cath at some point today Can restart apixaban 08/25/2022  Vaughan Basta BS, PharmD, BCPS Clinical Pharmacist 08/24/2022 11:00 AM  Contact: [phone] after 3 PM  "Be curious, not judgmental..." -Jamal Maes  Addendum  We will resume heparin at 1400 units/hr 6hr post tunnel cath (2200). Check 8 hr HL in AM.  Onnie Boer, PharmD, BCIDP, AAHIVP, CPP Infectious Disease Pharmacist 08/24/2022 4:03 PM

## 2022-08-24 NOTE — Plan of Care (Signed)

## 2022-08-24 NOTE — Progress Notes (Signed)
CSW left voicemail for patient's spouse as patient not feeling well.   Gilmore Laroche, MSW, [facility]

## 2022-08-24 NOTE — Progress Notes (Signed)
Mobility Specialist - Progress Note   08/24/22 1323  Mobility  Activity Stood at bedside  Level of Assistance Moderate assist, patient does 50-74%  Assistive Device Front wheel walker  Activity Response Tolerated poorly  Mobility Referral Yes  $Mobility charge 1 Mobility   Pt was received in bed and agreeable to session despite not feeling well. When asked about symptoms pt expressed that he is just does not feel good. Pt was able to come EOB independently and was ModA to stand. Upon standing for less than a minute pt SpO2 dropped to 65%. Pt was returned to supine with all needs met and bed alarm on. RN present.   Franki Monte  Mobility Specialist Please contact via Solicitor or Rehab office at [phone]

## 2022-08-24 NOTE — Consult Note (Signed)
Consultation Note Date: 08/24/2022   Patient Name: [name]  DOB: 09-Oct-1945  MRN: [id number]  Age / Sex: 77 y.o., male  PCP: Crecencio Mc, MD Referring Physician: Jonetta Osgood, MD  Reason for Consultation: Establishing goals of care  HPI/Patient Profile: 77 y.o. male  with past medical history of CHF, atrial fibrillation, ESRD on peritoneal dialysis, hypertension, hyperlipidemia, prior stroke admitted on 08/17/2022 with chest pain related to fall with rib fractures L 7-8 and possible L 12 fractures. Also noted to have 6 cm hemorrhagic renal cyst. Concern for upper GIB now resolved - holding on EGD due to no further emesis or bleeding. Being treated for aspiration pneumonia. Palliative care consulted for goals of care in the setting of acute illness and underlying decline in functional status and overall failure to thrive.   Clinical Assessment and Goals of Care: Consult received and chart reviewed. Discussed with RN and Dr. Sloan Leiter. I met with Donatello and he is alert but very short of breath. He attempts to answer my questions but his dyspnea prevents [name] from having a good conversation.   I called wife, Shirlean Mylar, and met her but [name] has gone to IR for tunneled HD catheter. I had a long talk with Shirlean Mylar about [name]'s condition. We reviewed the seriousness of this event and she sees how ill he is. She expresses concern that he may not pull through this time. She tells me that Garhett has been suffering a long time and has even expressed at times that he does not want to continue to live like this. She tells me that they have spoken and they they both have agreed they would not desire resuscitation and she believes he would desire DNR. We discussed that we need to know how far Christoph would want [name] to go to keep him alive. Would he even desire continued dialysis if it is not going to give him the quality of  life he desires? Shirlean Mylar feels that he may not want to continue but it is difficult for her to know. She reports that [name] has always done things his own way. She is supportive of his wishes and what he wants.   I returned and met again with [name] (wife is no longer at bedside). He has had tunneled HD catheter placed and his breathing is improved after receiving fentanyl during procedure. He is able to have a conversation with me now. I discussed with him his severe co-morbidities and serious acute illness. I expressed that he remains very ill. I spoke with him further about his wishes and we specifically discussed resuscitation. [name] was very clear in conversation today that he would desire resuscitation. I discussed with him further the suffering and poor outcomes associated with resuscitation for him. He continues to tell me that he would "want to be given a chance." He does confirm that he would NOT want to be prolonged on machines without improvement. He is very clear in his desire to pursue all measures to prolong life at this  time.   All questions/concerns addressed. Emotional support provided.   Primary Decision Maker PATIENT    SUMMARY OF RECOMMENDATIONS   - Full code, full scope  Code Status/Advance Care Planning: Full code   Symptom Management:  Per attending.   Prognosis:  Overall prognosis poor.   Discharge Planning: To Be Determined      Primary Diagnoses: Present on Admission:  Flail chest  Anemia   I have reviewed the medical record, interviewed the patient and family, and examined the patient. The following aspects are pertinent.  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 12/2018 Echo: EF 40-45%, global HK. Asc Ao 3.7cm; b.  01/2019 Lexi MV: small, mild, fixed basal and mid antlat defect - scar vs artifact. Small, mild mid and apical inf minimally reversible defect, likely scar w/ peri-infarct ischemia. Coronary and Ao atherosclerosis; c. 12/2019 Echo: EF 40-45%, glob HK, Gr1 DD. Nl RV fxn. Sev dil LA. Mild MR. Mild-mod Ao sclerosis w/o stenosis. Asc Ao 55mm.   Carotid arterial disease ([place])    a. 01/2020 RICA 1-39%, RCCA/RECA < [id number], LICA [id number], LCCA/LECA <50%.   Claudication ([place])    a. 02/2020 ABI/TBI: R 1.08/0.95, L 1.00/0.70.   Complication of anesthesia    had to be woken up slowly as his bp was elevated when did this quickly   Diabetes mellitus without complication (H[facility])    Dysplastic nevus 02/07/2013   L upper back, moderate atypia   ESRD (end stage renal disease) ([place])    a. Peritoneal Dialysis pt.   Hyperlipidemia    Hypertension    Migraine    cluster   Neuromuscular disorder ([place])    left lower extrem neuropathy   Obstructive sleep apnea    no OSA since had facial surgery with dr. Kathyrn Sheriff in 1997   PAD (peripheral artery disease) ([place]) 06/2007   nonobstructing, renal angiogram ([place])   Renal cell carcinoma 2004   left kidney heminephrectomy   Renal insufficiency    Sepsis ([place]) 11/06/2020   Skin cancer    R dorsum hand   Stroke ([place])    a. 12/2019 MRI: Small acute infarcts involving the left cerebral hemisphere and several chronic infarcts.   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  Tobacco Use   Smoking status: Every Day    Packs/day: 1.00    Years: 50.00    Additional pack years: 0.00    Total pack years: 50.00    Types: Cigarettes   Smokeless tobacco:  Never  Vaping Use   Vaping Use: Former   Devices: tried but did not like  Substance and Sexual Activity   Alcohol use: Not Currently   Drug use: No   Sexual activity: Yes  Other Topics Concern   Not on file  Social History Narrative   Not on file   Social Determinants of Health  Financial Resource Strain: Low Risk  (04/22/2022)   Overall Financial Resource Strain (CARDIA)    Difficulty of Paying Living Expenses: Not hard at all  Food Insecurity: No Food Insecurity (08/17/2022)   Hunger Vital Sign    Worried About Running Out of Food in the Last Year: Never true    Ran Out of Food in the Last Year: Never true  Transportation Needs: No Transportation Needs (08/17/2022)   PRAPARE - Hydrologist (Medical): No    Lack of Transportation (Non-Medical): No  Physical Activity: Insufficiently Active (04/22/2022)   Exercise Vital Sign    Days of Exercise per Week: 3 days    Minutes of Exercise per Session: 20 min  Stress: No Stress Concern Present (04/22/2022)   [place]    Feeling of Stress : Not at all  Social Connections: Unknown (04/22/2022)   Social Connection and Isolation Panel [NHANES]    Frequency of Communication with Friends and Family: Not on file    Frequency of Social Gatherings with Friends and Family: Not on file    Attends Religious Services: Not on file    Active Member of Clubs or Organizations: Not on file    Attends Archivist Meetings: Not on file    Marital Status: Married   Family History  Problem Relation Age of Onset   Hypertension Mother    Cancer Mother        breast   Aneurysm Mother    Coronary artery disease Father    Hypertension Father    Stroke Father 2   Heart disease Father    Heart attack Father 49   Aneurysm Maternal Grandmother        brain   Aneurysm Paternal Grandmother        brain   Coronary artery disease Paternal  Grandfather    Heart disease Brother        valvular heart disease   COPD Brother    Hypertension Brother    Stroke Paternal Uncle    Scheduled Meds:  amiodarone  200 mg Oral Daily   amLODipine  5 mg Oral Daily   bisoprolol  5 mg Oral BID   Chlorhexidine Gluconate Cloth  6 each Topical Daily   cloNIDine  0.1 mg Transdermal Q Fri   doxazosin  8 mg Oral Daily   feeding supplement (PROSource TF20)  60 mL Per Tube Daily   ferric citrate  420 mg Oral TID WC   free water  100 mL Per Tube TID   gentamicin cream  1 Application Topical Daily   lidocaine  1 patch Transdermal QHS   losartan  50 mg Oral Daily   multivitamin  1 tablet Per Tube QHS   nicotine  21 mg Transdermal Daily   pantoprazole  40 mg Intravenous Q12H   polyethylene glycol  17 g Oral BID   sennosides  15 mL Oral BID   Continuous Infusions:  dialysis solution 4.25% low-MG/low-CA     feeding supplement (OSMOLITE 1.5 CAL) 1,000 mL (08/23/22 1721)   heparin 1,300 Units/hr (08/24/22 0046)   promethazine (PHENERGAN) injection (IM or IVPB) Stopped (08/18/22 1826)   promethazine (PHENERGAN) injection (IM or IVPB)     PRN Meds:.acetaminophen, bisacodyl, fentaNYL (SUBLIMAZE) injection, lactulose, metoCLOPramide (REGLAN) injection, naLOXone (NARCAN)  injection, ondansetron **OR** ondansetron (ZOFRAN) IV, oxyCODONE, promethazine **OR** promethazine (PHENERGAN) injection (IM or IVPB) **OR** promethazine, promethazine (PHENERGAN) injection (IM or IVPB) Allergies  Allergen Reactions   Irbesartan Other (See Comments)    hyperkalemia   Cymbalta [Duloxetine Hcl]    Hydralazine Other (See Comments)    headache   Imdur [Isosorbide Nitrate] Other (See Comments)    headache   Rosuvastatin     Breast swelling/soreness   Atorvastatin Other (See Comments)    Muscle pain   Bystolic [Nebivolol Hcl]     Extreme fatigue    Review of Systems  Constitutional:  Positive for activity change, appetite change and fatigue.  Respiratory:   Positive for shortness of breath.   Neurological:  Positive for weakness.    Physical Exam Vitals and nursing note reviewed.  Constitutional:      Appearance: He is ill-appearing.  Cardiovascular:     Rate and Rhythm: Bradycardia present.  Pulmonary:     Effort: Tachypnea, accessory muscle usage and respiratory distress present.     Comments: Improved with medication Abdominal:     Palpations: Abdomen is soft.  Neurological:     Mental Status: He is alert and oriented to person, place, and time.     Vital Signs: BP (!) 130/46 (BP Location: Left Arm)   Pulse (!) 48   Temp 97.9 F (36.6 C) (Oral)   Resp 20   Ht 5\' 6"  (1.676 m)   Wt 69.6 kg   SpO2 93%   BMI 24.77 kg/m  Pain Scale: 0-10   Pain Score: 2    SpO2: SpO2: 93 % O2 Device:SpO2: 93 % O2 Flow Rate: .O2 Flow Rate (L/min): 2 L/min  IO: Intake/output summary:  Intake/Output Summary (Last 24 hours) at 08/24/2022 0758 Last data filed at 08/24/2022 0746 Gross per 24 hour  Intake -193 ml  Output --  Net -193 ml    LBM: Last BM Date : 08/15/22 Baseline Weight: Weight: 67.9 kg Most recent weight: Weight: 69.6 kg     Palliative Assessment/Data:     Time In: 1150  Time Total: 85 min  Greater than 50%  of this time was spent counseling and coordinating care related to the above assessment and plan.  Signed by: Vinie Sill, NP Palliative Medicine Team Pager # [phone] (M-F 8a-5p) Team Phone # [phone] (Nights/Weekends)

## 2022-08-24 NOTE — Consult Note (Signed)
Chief Complaint: Patient was seen in consultation today for tunneled dialysis catheter placement at the request of [place]  Referring Physician(s): Dr Corliss Marcus  Supervising Physician: Daryll Brod  Patient Status: [facility] - In-pt  History of Present Illness: [name] is a 77 y.o. 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 12/2018 Echo: EF 40-45%, global HK. Asc Ao 3.7cm; b. 01/2019 Lexi MV: small, mild, fixed basal and mid antlat defect - scar vs artifact. Small, mild mid and apical inf minimally reversible defect, likely scar w/ peri-infarct ischemia. Coronary and Ao atherosclerosis; c. 12/2019 Echo: EF 40-45%, glob HK, Gr1 DD. Nl RV fxn. Sev dil LA. Mild MR. Mild-mod Ao sclerosis w/o stenosis. Asc Ao 62mm.   Carotid arterial disease ([place])    a. 01/2020 RICA 1-39%, RCCA/RECA < [id number], LICA [id number], LCCA/LECA <50%.   Claudication ([place])    a. 02/2020 ABI/TBI: R 1.08/0.95, L 1.00/0.70.   Complication of anesthesia    had to be woken up slowly as his bp was elevated when did this quickly   Diabetes mellitus without complication (HCC)    Dysplastic nevus 02/07/2013   L upper back, moderate atypia   ESRD (end stage renal disease) ([place])    a. Peritoneal Dialysis pt.   Hyperlipidemia    Hypertension    Migraine    cluster   Neuromuscular disorder ([place])    left lower extrem neuropathy   Obstructive sleep apnea    no OSA since had facial surgery with dr. Kathyrn Sheriff in 1997   PAD (peripheral artery disease) ([place]) 06/2007   nonobstructing, renal  angiogram ([place])   Renal cell carcinoma 2004   left kidney heminephrectomy   Renal insufficiency    Sepsis ([place]) 11/06/2020   Skin cancer    R dorsum hand   Stroke ([place])    a. 12/2019 MRI: Small acute infarcts involving the left cerebral hemisphere and several chronic infarcts.   TIA (transient ischemic attack)    no residual but left leg and foot still feel heavy   tobacco abuse     Past Surgical History:  Procedure Laterality Date   CAPD INSERTION N/A 03/01/2019   Procedure: LAPAROSCOPIC INSERTION CONTINUOUS AMBULATORY PERITONEAL DIALYSIS  (CAPD) CATHETER;  Surgeon: Algernon Huxley, MD;  Location: [facility] ORS;  Service: Vascular;  Laterality: N/A;   CARDIAC CATHETERIZATION     Dr. [name] did this to assess his renal artery   cyst removal  12/25/2015   Spine L4 and L5   heminephrectomy  2004   for renal cell CA   RENAL CRYOABLATION  Jan 2006   right kidney,  [facility]   RIGHT HEART CATH AND CORONARY ANGIOGRAPHY Bilateral 06/29/2022   Procedure: RIGHT HEART CATH AND CORONARY ANGIOGRAPHY;  Surgeon: [name], MD;  Location: [place] CV LAB;  Service: Cardiovascular;  Laterality: Bilateral;   sciatica      Allergies: Irbesartan, Cymbalta [duloxetine hcl], Hydralazine, Imdur [isosorbide nitrate], Rosuvastatin, Atorvastatin, and Bystolic [nebivolol hcl]  Medications: Prior to Admission medications   Medication Sig Start Date End  Date Taking? Authorizing Provider  acetaminophen (TYLENOL) 325 MG tablet Take 2 tablets (650 mg total) by mouth every 4 (four) hours. 08/17/22  Yes Lorella Nimrod, MD  albuterol (VENTOLIN HFA) 108 (90 Base) MCG/ACT inhaler Inhale 2 puffs into the lungs every 6 (six) hours as needed for wheezing or shortness of breath. 05/04/22  Yes End, Harrell Gave, MD  amiodarone (PACERONE) 200 MG tablet Take 1 tablet (200 mg total) by mouth daily. 07/16/22  Yes Hammock, Sheri, NP  amLODipine (NORVASC) 5 MG tablet Take 1 tablet (5 mg total) by mouth daily. 06/29/22 06/29/23 Yes  End, Harrell Gave, MD  Ascorbic Acid 500 MG CHEW Chew 1 tablet by mouth daily.   Yes [provider]  b complex vitamins capsule Take 1 capsule by mouth daily.   Yes [provider]  bisoprolol (ZEBETA) 5 MG tablet Take 1 tablet (5 mg total) by mouth 2 (two) times daily. 01/18/22  Yes Emeterio Reeve, DO  celecoxib (CELEBREX) 200 MG capsule Take 1 capsule (200 mg total) by mouth 2 (two) times daily. 08/17/22  Yes Lorella Nimrod, MD  cloNIDine (CATAPRES - DOSED IN MG/24 HR) 0.1 mg/24hr patch Place 1 patch (0.1 mg total) onto the skin every Friday. 03/27/21  Yes Vickie Epley, MD  cloNIDine (CATAPRES) 0.2 MG tablet Take 0.2 mg by mouth at bedtime. 03/05/22  Yes [provider]  diphenhydrAMINE (BENADRYL) 25 MG tablet Take 25 mg by mouth at bedtime as needed for allergies or sleep.   Yes [provider]  doxazosin (CARDURA) 8 MG tablet Take 8 mg by mouth daily. 03/29/20  Yes [provider]  dupilumab (DUPIXENT) 300 MG/2ML prefilled syringe Inject 300 mg into the skin every 14 (fourteen) days. Starting at day 15 for maintenance. 08/03/22  Yes Ralene Bathe, MD  ELIQUIS 5 MG TABS tablet TAKE 1 TABLET BY MOUTH TWICE DAILY 05/13/22  Yes End, Harrell Gave, MD  ferric citrate (AURYXIA) 1 GM 210 MG(Fe) tablet Take 1 tablet by mouth 3 (three) times daily. 07/13/19  Yes [provider]  fluticasone (FLONASE) 50 MCG/ACT nasal spray Place 2 sprays into both nostrils daily as needed for allergies or rhinitis.   Yes [provider]  gentamicin cream (GARAMYCIN) 0.1 % Apply 1 application topically daily. 11/07/20  Yes Fritzi Mandes, MD  HYDROmorphone (DILAUDID) 1 MG/ML injection Inject 0.5 mLs (0.5 mg total) into the vein every 2 (two) hours as needed for moderate pain or severe pain. 08/17/22  Yes Lorella Nimrod, MD  hydrOXYzine (ATARAX) 25 MG tablet Take 25 mg by mouth 3 (three) times daily. 12/29/21  Yes [provider]  ketoconazole (NIZORAL) 2  % cream Apply 1 Application topically at bedtime. Qhs to feet for scaly rash on feet 05/26/22  Yes Ralene Bathe, MD  lactulose [facility]) 10 GM/15ML solution Take 15-30 mL by mouth daily as needed for constipation 06/25/22  Yes End, Harrell Gave, MD  losartan (COZAAR) 50 MG tablet Take 1 tablet (50 mg total) by mouth daily. 08/18/22  Yes Lorella Nimrod, MD  multivitamin (RENA-VIT) TABS tablet Take 1 tablet by mouth at bedtime. 12/21/19  Yes [provider]  oxyCODONE (OXYCONTIN) 10 mg 12 hr tablet Take 1 tablet (10 mg total) by mouth every 12 (twelve) hours. 08/17/22  Yes Lorella Nimrod, MD  sevelamer (RENAGEL) 800 MG tablet Take 800 mg by mouth 3 (three) times daily with meals.   Yes [provider]  tacrolimus (PROTOPIC) 0.1 % ointment Apply topically 2 (two) times daily. Bid to  itchy rash on body 05/26/22  Yes Ralene Bathe, MD  torsemide [facility]) 100 MG tablet Take 100 mg by mouth daily.   Yes [provider]  triamcinolone cream (KENALOG) 0.1 % Apply 1 Application topically as needed (itching). 04/13/22  Yes [provider]  lidocaine (LIDODERM) 5 % Place 1 patch onto the skin daily. Remove & Discard patch within 12 hours or as directed by MD 08/18/22   Lorella Nimrod, MD     Family History  Problem Relation Age of Onset   Hypertension Mother    Cancer Mother        breast   Aneurysm Mother    Coronary artery disease Father    Hypertension Father    Stroke Father 83   Heart disease Father    Heart attack Father 92   Aneurysm Maternal Grandmother        brain   Aneurysm Paternal Grandmother        brain   Coronary artery disease Paternal Grandfather    Heart disease Brother        valvular heart disease   COPD Brother    Hypertension Brother    Stroke Paternal Uncle     Social History   Socioeconomic History   Marital status: Married    Spouse name: Robyn   Number of children: Not on file   Years of education: Not on file   Highest  education level: Not on file  Occupational History   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  Tobacco Use   Smoking status: Every Day    Packs/day: 1.00    Years: 50.00    Additional pack years: 0.00    Total pack years: 50.00    Types: Cigarettes   Smokeless tobacco: Never  Vaping Use   Vaping Use: Former   Devices: tried but did not like  Substance and Sexual Activity   Alcohol use: Not Currently   Drug use: No   Sexual activity: Yes  Other Topics Concern   Not on file  Social History Narrative   Not on file   Social Determinants of Health   Financial Resource Strain: Low Risk  (04/22/2022)   Overall Financial Resource Strain (CARDIA)    Difficulty of Paying Living Expenses: Not hard at all  Food Insecurity: No Food Insecurity (08/17/2022)   Hunger Vital Sign    Worried About Running Out of Food in the Last Year: Never true    [place] in the Last Year: Never true  Transportation Needs: No Transportation Needs (08/17/2022)   PRAPARE - Hydrologist (Medical): No    Lack of Transportation (Non-Medical): No  Physical Activity: Insufficiently Active (04/22/2022)   Exercise Vital Sign    Days of Exercise per Week: 3 days    Minutes of Exercise per Session: 20 min  Stress: No Stress Concern Present (04/22/2022)   [place]    Feeling of Stress : Not at all  Social Connections: Unknown (04/22/2022)   Social Connection and Isolation Panel [NHANES]    Frequency of Communication with Friends and Family: Not on file    Frequency of Social Gatherings with Friends and Family: Not on file    Attends Religious Services: Not on file    Active Member of Clubs or Organizations: Not on file    Attends Archivist Meetings: Not on file    Marital Status: Married  Review of Systems: A 12 point ROS discussed and pertinent positives are indicated in the HPI above.  All  other systems are negative.   Vital Signs: BP (!) 141/70 (BP Location: Left Arm)   Pulse 60   Temp 97.8 F (36.6 C)   Resp 20   Ht 5\' 6"  (1.676 m)   Wt 153 lb 7 oz (69.6 kg)   SpO2 93%   BMI 24.77 kg/m    Physical Exam Vitals reviewed.  HENT:     Mouth/Throat:     Mouth: Mucous membranes are moist.  Cardiovascular:     Rate and Rhythm: Normal rate and regular rhythm.  Pulmonary:     Comments: Shallow breaths; lung sounds faint Chest:     Chest wall: Tenderness present.  Abdominal:     Palpations: Abdomen is soft.  Skin:    General: Skin is warm.  Neurological:     Mental Status: He is alert and oriented to person, place, and time.  Psychiatric:        Behavior: Behavior normal.     Imaging: DG Abd Portable 1V  Result Date: 08/23/2022 CLINICAL DATA:  Encounter for feeding tube placement EXAM: PORTABLE ABDOMEN - 1 VIEW COMPARISON:  Portable exam 1420 hours compared to 08/19/2022 FINDINGS: Tip of feeding tube projects over distal gastric antrum. Enlargement of cardiac silhouette. Atherosclerotic calcification and tortuosity of thoracic aorta. Bibasilar pulmonary infiltrates and RIGHT pleural effusion. Visualized bowel gas pattern in upper abdomen unremarkable. IMPRESSION: Tip of feeding tube projects over distal gastric antrum. Electronically Signed   By: Lavonia Dana M.D.   On: 08/23/2022 15:10   DG Swallowing Func-Speech Pathology  Result Date: 08/23/2022 Table formatting from the original result was not included. Modified Barium Swallow Study Patient Details Name: VEIKKO BELOTTI MRN: [id number] Date of Birth: April 24, 1946 Today's Date: 08/23/2022 HPI/PMH: HPI: Pt is a 77 y.o. male who presented after a fall. Pt with nondisplaced L 7th and 8th rib fractures. GI consulted due to coffee-ground emesis, and nausea. Hematemesis thought to be due to Mallory-Weiss tear which resolved and EGD thought not be needed and GI signed off 3/31. SLP consulted 3/31 due to RN's report of pt having  difficulty initiating the swallow and coughing thereafter. Pt was on full liquids since 3/29, but has been cleared for advancement by Dr. Nena Alexander. PMH: CHF, A-fib, ESRD on peritoneal dialysis, hypertension, hyperlipidemia, CVA. Clinical Impression: Clinical Impression: Pt's overall swallow function was not marked by significant deficits, but mild deficits across many components of the swallow led to penetration and then aspiration of thin, nectar, honey, and potentially pureed consistencies.  He was uncomfortable during exam; voice and cough were extremely weak.  Trace penetration of materials to the vocal folds led to consistent weak coughing but he was unable to eject material from the larynx.  By the end of the exam, his fatigue precipitated oral suctioning of purees from mouth.  Until his mental status and functional reserve improve, he is unlikely to be able to consume enough POs by mouth nor have the stamina to tolerate even small degees of aspiration. He may benefit from a cortrak at this time.  NPO for now. Recommend allowing ice chips when he is sufficiently alert and able to f/c without delay. Factors that may increase risk of adverse event in presence of aspiration ([place] 2021): Factors that may increase risk of adverse event in presence of aspiration ([place] 2021): Reduced cognitive function; Frail or deconditioned; Weak  cough; Aspiration of thick, dense, and/or acidic materials Recommendations/Plan: Swallowing Evaluation Recommendations Swallowing Evaluation Recommendations Recommendations: NPO Oral care recommendations: Oral care QID (4x/day) Treatment Plan Treatment Plan Treatment recommendations: Therapy as outlined in treatment plan below Treatment frequency: Min 2x/week Treatment duration: 2 weeks Interventions: Oropharyngeal exercises; Patient/family education; Trials of upgraded texture/liquids; Diet toleration management by SLP Recommendations Recommendations for follow  up therapy are one component of a multi-disciplinary discharge planning process, led by the attending physician.  Recommendations may be updated based on patient status, additional functional criteria and insurance authorization. Assessment: Orofacial Exam: Orofacial Exam Oral Cavity - Dentition: Adequate natural dentition Anatomy: Anatomy: WFL Boluses Administered: Boluses Administered Boluses Administered: Thin liquids (Level 0); Mildly thick liquids (Level 2, nectar thick); Moderately thick liquids (Level 3, honey thick); Puree  Oral Impairment Domain: Oral Impairment Domain Lip Closure: No labial escape Tongue control during bolus hold: Cohesive bolus between tongue to palatal seal Bolus preparation/mastication: -- (NT) Bolus transport/lingual motion: Delayed initiation of tongue motion (oral holding) Oral residue: Residue collection on oral structures Location of oral residue : Tongue; Lateral sulci Initiation of pharyngeal swallow : Pyriform sinuses  Pharyngeal Impairment Domain: Pharyngeal Impairment Domain Soft palate elevation: No bolus between soft palate (SP)/pharyngeal wall (PW) Laryngeal elevation: Complete superior movement of thyroid cartilage with complete approximation of arytenoids to epiglottic petiole Anterior hyoid excursion: Complete anterior movement Epiglottic movement: Complete inversion Laryngeal vestibule closure: Incomplete, narrow column air/contrast in laryngeal vestibule Pharyngeal stripping wave : Present - diminished Pharyngeal contraction (A/P view only): N/A Pharyngoesophageal segment opening: Partial distention/partial duration, partial obstruction of flow Tongue base retraction: Trace column of contrast or air between tongue base and PPW Pharyngeal residue: Trace residue within or on pharyngeal structures Location of pharyngeal residue: Valleculae; Pharyngeal wall; Pyriform sinuses  Esophageal Impairment Domain: No data recorded Pill: No data recorded Penetration/Aspiration Scale  Score: Penetration/Aspiration Scale Score 5.  Material enters airway, CONTACTS cords and not ejected out: Thin liquids (Level 0); Mildly thick liquids (Level 2, nectar thick); Moderately thick liquids (Level 3, honey thick); Puree 7.  Material enters airway, passes BELOW cords and not ejected out despite cough attempt by patient: Thin liquids (Level 0); Mildly thick liquids (Level 2, nectar thick); Moderately thick liquids (Level 3, honey thick) Compensatory Strategies: Compensatory Strategies Compensatory strategies: No   General Information: Caregiver present: No  Diet Prior to this Study: NPO   Temperature : Normal   No data recorded  Supplemental O2: Nasal cannula   History of Recent Intubation: No  Behavior/Cognition: Confused; Lethargic/Drowsy No data recorded Baseline vocal quality/speech: Hypophonia/low volume Volitional Cough: Able to elicit Volitional Swallow: Unable to elicit Exam Limitations: Limited visibility Goal Planning: Prognosis for improved oropharyngeal function: Good Barriers to Reach Goals: Cognitive deficits No data recorded Patient/Family Stated Goal: For him to improve Consulted and agree with results and recommendations: Pt unable/family or caregiver not available Pain: Pain Assessment Pain Assessment: No/denies pain Pain Score: 10 Faces Pain Scale: 4 Breathing: 1 Negative Vocalization: 1 Facial Expression: 1 Body Language: 1 Consolability: 1 PAINAD Score: 5 Facial Expression: 2 Body Movements: 1 Muscle Tension: 1 Compliance with ventilator (intubated pts.): N/A Vocalization (extubated pts.): 0 CPOT Total: 4 Pain Location: ribs Pain Descriptors / Indicators: Aching; Grimacing; Guarding Pain Intervention(s): Monitored during session; Limited activity within patient's tolerance End of Session: Start Time:SLP Start Time (ACUTE ONLY): 0940 Stop Time: SLP Stop Time (ACUTE ONLY): 1005 Time Calculation:SLP Time Calculation (min) (ACUTE ONLY): 25 min Charges: SLP Evaluations $ SLP Speech Visit: 1  Visit SLP Evaluations $BSS Swallow: 1 Procedure $MBS Swallow: 1 Procedure SLP visit diagnosis: SLP Visit Diagnosis: Dysphagia, oropharyngeal phase (R13.12) Past Medical History: Past Medical History: Diagnosis Date  Acute on chronic systolic congestive heart failure ([place])   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 12/2018 Echo: EF 40-45%, global HK. Asc Ao 3.7cm; b. 01/2019 Lexi MV: small, mild, fixed basal and mid antlat defect - scar vs artifact. Small, mild mid and apical inf minimally reversible defect, likely scar w/ peri-infarct ischemia. Coronary and Ao atherosclerosis; c. 12/2019 Echo: EF 40-45%, glob HK, Gr1 DD. Nl RV fxn. Sev dil LA. Mild MR. Mild-mod Ao sclerosis w/o stenosis. Asc Ao 71mm.  Carotid arterial disease ([place])   a. 01/2020 RICA 1-39%, RCCA/RECA < [id number], LICA [id number], LCCA/LECA <50%.  Claudication ([place])   a. 02/2020 ABI/TBI: R 1.08/0.95, L 1.00/0.70.  Complication of anesthesia   had to be woken up slowly as his bp was elevated when did this quickly  Diabetes mellitus without complication (HCC)   Dysplastic nevus 02/07/2013  L upper back, moderate atypia  ESRD (end stage renal disease) ([place])   a. Peritoneal Dialysis pt.  Hyperlipidemia   Hypertension   Migraine   cluster  Neuromuscular disorder ([place])   left lower extrem neuropathy  Obstructive sleep apnea   no OSA since had facial surgery with dr. Kathyrn Sheriff in 1997  PAD (peripheral artery disease) ([place]) 06/2007  nonobstructing, renal angiogram ([place])  Renal cell carcinoma 2004  left kidney heminephrectomy  Renal insufficiency   Sepsis ([place]) 11/06/2020  Skin cancer   R dorsum hand  Stroke ([place])   a. 12/2019 MRI: Small acute infarcts involving the left cerebral hemisphere and several chronic infarcts.  TIA (transient ischemic attack)   no residual but left leg and foot still feel heavy  tobacco abuse  Past Surgical History: Past  Surgical History: Procedure Laterality Date  CAPD INSERTION N/A 03/01/2019  Procedure: LAPAROSCOPIC INSERTION CONTINUOUS AMBULATORY PERITONEAL DIALYSIS  (CAPD) CATHETER;  Surgeon: Algernon Huxley, MD;  Location: [facility] ORS;  Service: Vascular;  Laterality: N/A;  CARDIAC CATHETERIZATION    Dr. [name] did this to assess his renal artery  cyst removal  12/25/2015  Spine L4 and L5  heminephrectomy  2004  for renal cell CA  RENAL CRYOABLATION  Jan 2006  right kidney,  [facility]  RIGHT HEART CATH AND CORONARY ANGIOGRAPHY Bilateral 06/29/2022  Procedure: RIGHT HEART CATH AND CORONARY ANGIOGRAPHY;  Surgeon: [name], MD;  Location: [place] CV LAB;  Service: Cardiovascular;  Laterality: Bilateral;  sciatica   Assunta Curtis 08/23/2022, 11:02 AM  DG Chest Port 1V same Day  Result Date: 08/21/2022 CLINICAL DATA:  Shortness of breath EXAM: PORTABLE CHEST 1 VIEW COMPARISON:  Yesterday FINDINGS: Cardiopericardial enlargement. Right more than left pleural effusion with underlying pulmonary opacity and interstitial thickening. No pneumothorax. IMPRESSION: Continued CHF pattern. Electronically Signed   By: Jorje Guild M.D.   On: 08/21/2022 08:06   DG Chest Port 1 View  Result Date: 08/20/2022 CLINICAL DATA:  Shortness of breath EXAM: PORTABLE CHEST 1 VIEW COMPARISON:  Chest x-ray dated August 19, 2018 FINDINGS: Unchanged cardiomegaly. Elevation of the right hemidiaphragm. Moderate small bilateral pleural effusions and bibasilar atelectasis. Unchanged right lower lobe consolidation. No evidence of pneumothorax. IMPRESSION: 1. Unchanged right lower lobe consolidation, concerning for infection or aspiration. 2. Small bilateral pleural effusions. Electronically Signed   By: Denny Peon  Strickland M.D.   On: 08/20/2022 08:30   DG Chest Port 1V same Day  Result Date: 08/19/2022 CLINICAL DATA:  Shortness of breath. EXAM: PORTABLE CHEST 1 VIEW COMPARISON:  08/19/2022 FINDINGS: The cardio pericardial silhouette is enlarged.  Diffuse interstitial opacity is similar to prior. Stable volume loss right hemithorax with right base collapse/consolidation and small to moderate right pleural effusion. Small left pleural effusion again noted. Bones are diffusely demineralized. Telemetry leads overlie the chest. IMPRESSION: No substantial interval change in exam. Right base collapse/consolidation with small to moderate right pleural effusion and small left effusion. Electronically Signed   By: Misty Stanley M.D.   On: 08/19/2022 09:48   DG Abd 2 Views  Result Date: 08/19/2022 CLINICAL DATA:  Vomiting EXAM: ABDOMEN - 2 VIEW COMPARISON:  08/16/2022 FINDINGS: Scattered large and small bowel gas is noted. No obstructive changes are seen. No free air is noted no acute bony abnormality is seen. IMPRESSION: No acute abnormality noted. Electronically Signed   By: Inez Catalina M.D.   On: 08/19/2022 02:33   DG CHEST PORT 1 VIEW  Result Date: 08/17/2022 CLINICAL DATA:  Known left rib fractures and chest pain, initial encounter EXAM: PORTABLE CHEST 1 VIEW COMPARISON:  08/16/2022 FINDINGS: Cardiac shadow is enlarged but stable. Aortic calcifications are seen. Vascular congestion is noted centrally. Mild left pleural effusion is noted. Known left rib fractures are not well appreciated on this exam. No acute bony abnormality is noted. IMPRESSION: New small left effusion. Mild central vascular congestion. Electronically Signed   By: Inez Catalina M.D.   On: 08/17/2022 22:25   CT Abdomen Pelvis W Contrast  Result Date: 08/16/2022 CLINICAL DATA:  Abdominal pain, acute, nonlocalized LUQ pain s/p fall, free air seen on CT chest Pt to ED for fall 4 ft onto diesel fuel can, hitting left side. Pt unable to stand up or ambulate, holding left side, grunting EXAM: CT ABDOMEN AND PELVIS WITH CONTRAST TECHNIQUE: Multidetector CT imaging of the abdomen and pelvis was performed using the standard protocol following bolus administration of intravenous contrast.  RADIATION DOSE REDUCTION: This exam was performed according to the departmental dose-optimization program which includes automated exposure control, adjustment of the mA and/or kV according to patient size and/or use of iterative reconstruction technique. CONTRAST:  145mL OMNIPAQUE IOHEXOL 300 MG/ML  SOLN COMPARISON:  CT chest 08/16/2022, CT abdomen pelvis 03/25/2022 FINDINGS: Lower chest: Bronchial wall thickening. Bilateral lower lobe subsegmental atelectasis. Bilateral trace pleural effusions. Aortic valve leaflet and coronary artery calcification. Cardiomegaly. Hepatobiliary: No focal liver abnormality. No gallstones, gallbladder wall thickening, or pericholecystic fluid. No biliary dilatation. Pancreas: No focal lesion. Normal pancreatic contour. No surrounding inflammatory changes. No main pancreatic ductal dilatation. Spleen: Normal in size without focal abnormality.  Spleen noted. Adrenals/Urinary Tract: No adrenal nodule bilaterally. Multiple fluid density lesions within the kidneys suggestive of simple renal cysts. Simple renal cysts, in the absence of clinically indicated signs/symptoms, require no independent follow-up. There is a 6.4 cm left cystic lesion with interval development of layering hyperdensity of 56 Hounsfield units suggestive of a hemorrhagic cyst. No hydronephrosis. No hydroureter. The urinary bladder is unremarkable. On delayed imaging, there is no excretion of intravenous contrast from either kidneys. Stomach/Bowel: Stomach is within normal limits. No evidence of bowel wall thickening or dilatation. Diffuse colonic diverticulosis. Appendix appears normal. Vascular/Lymphatic: No abdominal aorta or iliac aneurysm. Extensive calcified and noncalcified atherosclerotic plaque of the aorta and its branche with diminutive lumen of the left common iliac artery down to 4  mm. Diminutive lumen of the infra-abdominal proximal abdominal aorta with caliber of 7 mm. No abdominal, pelvic, or inguinal  lymphadenopathy. Reproductive: The prostate is enlarged measuring up to 4.9 cm. Other: Peritoneal dialysis catheter with tip terminating within the right pelvis. Trace free fluid within the upper abdomen and pelvis. Persistent moderate to large volume free intraperitoneal gas within the abdomen and pelvis. No organized fluid collection. No mesenteric fat stranding. Musculoskeletal: No chest wall abnormality. No suspicious lytic or blastic osseous lesions. No acute displaced fracture. Multilevel degenerative changes of the spine. IMPRESSION: 1. Persistent extensive pneumoperitoneum which is more than usual in the setting of a peritoneal dialysis catheter. Given patient has diffuse colonic diverticulosis, finding could be due to a peripheral diverticula; however, no acute diverticulitis identified. No findings of inflammatory changes or other to suggest bowel perforation. 2. A 6.4 cm left cystic lesion with interval development of layering hyperdensity suggestive of a hemorrhagic cyst. Recommend short-term follow-up ultrasound for further evaluation. 3. Colonic diverticulosis with no acute diverticulitis. 4. Bilateral trace pleural effusion and trace simple free fluid ascites. 5.  Aortic Atherosclerosis (ICD10-I70.0) - extensive. 6. Please see separately dictated CT chest 08/15/2023. Electronically Signed   By: Iven Finn M.D.   On: 08/16/2022 21:30   CT Chest Wo Contrast  Result Date: 08/16/2022 CLINICAL DATA:  Chest trauma after a fall. Left anterior lower chest wall pain. EXAM: CT CHEST WITHOUT CONTRAST TECHNIQUE: Multidetector CT imaging of the chest was performed following the standard protocol without IV contrast. RADIATION DOSE REDUCTION: This exam was performed according to the departmental dose-optimization program which includes automated exposure control, adjustment of the mA and/or kV according to patient size and/or use of iterative reconstruction technique. COMPARISON:  Chest radiograph  08/16/2022 FINDINGS: Cardiovascular: Cardiac enlargement. Small pericardial effusion. Diffuse calcification of the aorta and great vessels as well as the coronary arteries. No aortic aneurysm. Mediastinum/Nodes: Thyroid gland is unremarkable. Esophagus is decompressed. Mediastinal lymph nodes are not pathologically enlarged, likely reactive. Lungs/Pleura: Trace bilateral pleural effusions with basilar atelectasis bilaterally. Emphysematous changes in the lungs. Interstitial septal thickening with bronchial wall thickening likely representing chronic bronchitic change. No focal consolidation. No pneumothorax. Upper Abdomen: Free intraperitoneal air is demonstrated in the upper abdomen. In the absence of recent surgery, this is suspicious for bowel perforation. Heterogeneous enlargement and heterogeneous density of the left kidney suggest renal laceration and hematoma. Small amount of free fluid in the upper abdomen. Completion CT of the abdomen and pelvis with contrast is suggested. Musculoskeletal: Nondisplaced fractures of the left lateral seventh and eighth rib. Possible nondisplaced fracture of the left twelfth rib. Degenerative changes in the spine. No vertebral compression. IMPRESSION: 1. Nondisplaced fractures of the left lateral seventh and eighth ribs as well as possible fracture of the left twelfth rib. 2. Free air in the abdomen with small amount of free fluid likely indicating bowel perforation. 3. Heterogeneous enlargement and density in the left kidney suggesting renal laceration with hematoma. 4. Completion CT of the abdomen and pelvis with contrast is suggested. 5. Trace bilateral pleural effusions. Emphysematous changes and fibrosis with bronchitic changes in the lungs. 6. Cardiac enlargement with small pericardial effusion. Critical Value/emergent results were called by telephone at the time of interpretation on 08/16/2022 at 8:27 pm to provider [facility] , who verbally acknowledged these results.  Electronically Signed   By: Lucienne Capers M.D.   On: 08/16/2022 20:35   DG Chest Port 1 View  Result Date: 08/16/2022 CLINICAL DATA:  Fall EXAM: PORTABLE  CHEST 1 VIEW COMPARISON:  Chest x-ray dated January 07, 2022 FINDINGS: Unchanged cardiomegaly. Mild diffuse interstitial opacities. No consolidation. Skin folds overlying the left hemithorax. No evidence of pleural effusion or pneumothorax. IMPRESSION: 1. No focal consolidation. 2. Mild diffuse interstitial opacities, likely due to pulmonary edema. Electronically Signed   By: Yetta Glassman M.D.   On: 08/16/2022 16:05    Labs:  CBC: Recent Labs    08/21/22 0651 08/22/22 0825 08/23/22 0750 08/24/22 0952  WBC 9.5 11.5* 12.6* 10.7*  HGB 8.9* 9.3* 10.0* 9.5*  HCT 27.7* 28.1* 30.4* 28.4*  PLT 196 204 225 200    COAGS: Recent Labs    08/17/22 0059 08/17/22 0436  INR  --  1.2  APTT 32 33    BMP: Recent Labs    08/21/22 0651 08/22/22 0825 08/23/22 0750 08/24/22 0952  NA 135 135 136 137  K 4.9 5.2* 4.3 4.0  CL 99 100 101 101  CO2 23 21* 23 25  GLUCOSE 60* 92 165* 109*  BUN 70* 75* 75* 76*  CALCIUM 7.9* 7.9* 8.0* 7.9*  CREATININE 8.79* 8.73* 8.61* 8.05*  GFRNONAA 6* 6* 6* 6*    LIVER FUNCTION TESTS: Recent Labs    09/09/21 1149 08/16/22 2116 08/16/22 2116 08/18/22 2140 08/19/22 0929 08/21/22 0651 08/22/22 0825 08/23/22 0750 08/24/22 0952  BILITOT <0.2 0.5  --  0.6  --   --   --   --   --   AST 7 12*  --  13*  --   --   --   --   --   ALT 9 12  --  13  --   --   --   --   --   ALKPHOS 128* 107  --  122  --   --   --   --   --   PROT 5.2* 5.2*  --  4.9*  --   --   --   --   --   ALBUMIN 2.9* 2.1*   < > 1.9*   < > <1.5* <1.5* 1.5* <1.5*   < > = values in this interval not displayed.    TUMOR MARKERS: No results for input(s): "AFPTM", "CEA", "CA199", "CHROMGRNA" in the last 8760 hours.  Assessment and Plan:  Scheduled for tunneled dialysis catheter Converting from peritoneal dialysis to HD per  Nephrology--- to tx to SNF soon Risks and benefits discussed with the patient including, but not limited to bleeding, infection, vascular injury, pneumothorax which may require chest tube placement, air embolism or even death  All of the patient's questions were answered, patient is agreeable to proceed. Consent signed and in chart.   Thank you for this interesting consult.  I greatly enjoyed meeting [name] and look forward to participating in their care.  A copy of this report was sent to the requesting provider on this date.  Electronically Signed: Lavonia Drafts, PA-C 08/24/2022, 12:14 PM   I spent a total of 20 Minutes    in face to face in clinical consultation, greater than 50% of which was counseling/coordinating care for tunn dialysis catheter placement

## 2022-08-24 NOTE — Progress Notes (Signed)
Patient ID: [name], male   DOB: 1946-02-18, 77 y.o.   MRN: [id number] [facility] Progress Note   Assessment/ Plan:   1.  Mechanical fall with fracture of the left seventh/eighth and possibly 12th ribs: Originally transferred for suspicion of flail chest however this was ruled out by trauma surgery.  Ongoing efforts at symptom management. 2. ESRD: Continue peritoneal dialysis with cautious increase of ultrafiltration for volume to 1.5 L. Will use 2.5% as he appears on the dry side and BP low + net neg 2.6L during hospitalization. Will increase to 5 exchanges a night. If he tolerates then will increase to 1.8L tomorrow evening. Net 129mL UF 4/1 AM but he's not eating or drinking. Issues with drain when I saw him on Tues.  Will ask VIR to place a TC since he will be headed to a SNF and then will require temporary iHD as SNF would not offer PD.  3. Anemia: Currently with stable hemoglobin/hematocrit after having coffee-ground emesis last week, concerns raised regarding Mallory-Weiss tear versus upper GI bleed with no immediate plans for EGD. 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  Switched from sevelamer to [place]. 5. Nutrition: Continue renal diet with fluid restriction/phosphorus restriction. 6.  Acute hypoxic respiratory failure: Suspected to be likely secondary to aspiration pneumonia in the setting of recurrent emesis/intolerance to PD fill volume.  Started on intravenous Unasyn with improvement of leukocytosis but continued dependency on oxygen supplementation/hypoxia noted.  Reattempt UF on PD.  Subjective:   Continues to have left chest discomfort with intermittent shortness of breath overnight requiring oxygen via nasal cannula.  Tolerated 1.5 L for volume with PD overnight with 2.5% but issues with drain this AM.    Objective:   BP (!) 130/46 (BP Location: Left Arm)   Pulse (!) 48   Temp 97.9 F  (36.6 C) (Oral)   Resp 20   Ht 5\' 6"  (1.676 m)   Wt 69.6 kg   SpO2 93%   BMI 24.77 kg/m   Physical Exam: Gen: Fatigued resting in bed; mildly tachypneic. On oxygen via nasal cannula CVS: Pulse regular rhythm, normal rate, S1 and S2 normal Resp: Poor inspiratory effort with decreased breath sounds over bases, no rales/rhonchi Abd: Soft, slightly distended, bowel sounds normal Ext: No lower extremity edema  Labs: BMET Recent Labs  Lab 08/18/22 2140 08/19/22 0929 08/20/22 0041 08/21/22 0651 08/22/22 0825 08/23/22 0750 08/23/22 2145  NA 137 136 135 135 135 136  --   K 4.7 4.9 4.8 4.9 5.2* 4.3  --   CL 102 103 101 99 100 101  --   CO2 24 22 22 23  21* 23  --   GLUCOSE 203* 72 77 60* 92 165*  --   BUN 49* 50* 52* 70* 75* 75*  --   CREATININE 8.09* 8.18* 8.24* 8.79* 8.73* 8.61*  --   CALCIUM 8.1* 7.5* 7.6* 7.9* 7.9* 8.0*  --   PHOS  --  6.5* 6.8* 7.3* 6.9* 6.9* 6.0*   CBC Recent Labs  Lab 08/18/22 2140 08/19/22 0929 08/20/22 0639 08/21/22 0651 08/22/22 0825 08/23/22 0750  WBC 19.1*   < > 10.8* 9.5 11.5* 12.6*  NEUTROABS 17.3*  --   --   --   --   --   HGB 9.8*   < > 8.4* 8.9* 9.3* 10.0*  HCT 29.0*   < > 25.7* 27.7* 28.1* 30.4*  MCV 86.6   < >  88.0 88.5 87.3 87.9  PLT 165   < > 156 196 204 225   < > = values in this interval not displayed.      Medications:     amiodarone  200 mg Oral Daily   amLODipine  5 mg Oral Daily   bisoprolol  5 mg Oral BID   Chlorhexidine Gluconate Cloth  6 each Topical Daily   cloNIDine  0.1 mg Transdermal Q Fri   doxazosin  8 mg Oral Daily   feeding supplement (PROSource TF20)  60 mL Per Tube Daily   ferric citrate  420 mg Oral TID WC   free water  100 mL Per Tube TID   gentamicin cream  1 Application Topical Daily   lidocaine  1 patch Transdermal QHS   losartan  50 mg Oral Daily   multivitamin  1 tablet Per Tube QHS   nicotine  21 mg Transdermal Daily   pantoprazole  40 mg Intravenous Q12H   polyethylene glycol  17 g Oral BID    sennosides  15 mL Oral BID

## 2022-08-24 NOTE — Progress Notes (Signed)
ANTICOAGULATION CONSULT NOTE - Follow Up Consult  Pharmacy Consult for heparin Indication: atrial fibrillation  Labs: Recent Labs    08/21/22 0651 08/22/22 0825 08/23/22 0750 08/23/22 2125  HGB 8.9* 9.3* 10.0*  --   HCT 27.7* 28.1* 30.4*  --   PLT 196 204 225  --   HEPARINUNFRC  --   --  0.18* 0.13*  CREATININE 8.79* 8.73* 8.61*  --     Assessment: 76yo male subtherapeutic on heparin after rate change; no infusion issues or signs of bleeding per RN.  Goal of Therapy:  Heparin level 0.3-0.5 units/ml   Plan:  Will increase heparin infusion cautiously by 2 units/kg/hr to 1300 units/hr and check level in 8 hours.    Wynona Neat, PharmD, BCPS  08/24/2022,1:52 AM

## 2022-08-25 ENCOUNTER — Inpatient Hospital Stay (HOSPITAL_COMMUNITY): Payer: Medicare PPO

## 2022-08-25 DIAGNOSIS — Z7189 Other specified counseling: Secondary | ICD-10-CM | POA: Diagnosis not present

## 2022-08-25 DIAGNOSIS — Z515 Encounter for palliative care: Secondary | ICD-10-CM | POA: Diagnosis not present

## 2022-08-25 DIAGNOSIS — I2510 Atherosclerotic heart disease of native coronary artery without angina pectoris: Secondary | ICD-10-CM | POA: Diagnosis not present

## 2022-08-25 DIAGNOSIS — N186 End stage renal disease: Secondary | ICD-10-CM | POA: Diagnosis not present

## 2022-08-25 DIAGNOSIS — S2242XD Multiple fractures of ribs, left side, subsequent encounter for fracture with routine healing: Secondary | ICD-10-CM | POA: Diagnosis not present

## 2022-08-25 DIAGNOSIS — I10 Essential (primary) hypertension: Secondary | ICD-10-CM | POA: Diagnosis not present

## 2022-08-25 LAB — CBC
HCT: 26.1 % — ABNORMAL LOW (ref 39.0–52.0)
Hemoglobin: 8.6 g/dL — ABNORMAL LOW (ref 13.0–17.0)
MCH: 28.8 pg (ref 26.0–34.0)
MCHC: 33 g/dL (ref 30.0–36.0)
MCV: 87.3 fL (ref 80.0–100.0)
Platelets: 177 10*3/uL (ref 150–400)
RBC: 2.99 MIL/uL — ABNORMAL LOW (ref 4.22–5.81)
RDW: 16 % — ABNORMAL HIGH (ref 11.5–15.5)
WBC: 11.1 10*3/uL — ABNORMAL HIGH (ref 4.0–10.5)
nRBC: 0.6 % — ABNORMAL HIGH (ref 0.0–0.2)

## 2022-08-25 LAB — RENAL FUNCTION PANEL
Albumin: <1.5 g/dL — ABNORMAL LOW (ref 3.5–5.0)
Anion gap: 14 (ref 5–15)
BUN: 77 mg/dL — ABNORMAL HIGH (ref 8–23)
CO2: 25 mmol/L (ref 22–32)
Calcium: 8.1 mg/dL — ABNORMAL LOW (ref 8.9–10.3)
Chloride: 99 mmol/L (ref 98–111)
Creatinine, Ser: 8.19 mg/dL — ABNORMAL HIGH (ref 0.61–1.24)
GFR, Estimated: 6 mL/min — ABNORMAL LOW (ref >60)
Glucose, Bld: 153 mg/dL — ABNORMAL HIGH (ref 70–99)
Phosphorus: 5.1 mg/dL — ABNORMAL HIGH (ref 2.5–4.6)
Potassium: 3.7 mmol/L (ref 3.5–5.1)
Sodium: 138 mmol/L (ref 135–145)

## 2022-08-25 LAB — MAGNESIUM: Magnesium: 2 mg/dL (ref 1.7–2.4)

## 2022-08-25 LAB — GLUCOSE, CAPILLARY
Glucose-Capillary: 124 mg/dL — ABNORMAL HIGH (ref 70–99)
Glucose-Capillary: 143 mg/dL — ABNORMAL HIGH (ref 70–99)
Glucose-Capillary: 149 mg/dL — ABNORMAL HIGH (ref 70–99)
Glucose-Capillary: 156 mg/dL — ABNORMAL HIGH (ref 70–99)
Glucose-Capillary: 204 mg/dL — ABNORMAL HIGH (ref 70–99)
Glucose-Capillary: 217 mg/dL — ABNORMAL HIGH (ref 70–99)
Glucose-Capillary: 234 mg/dL — ABNORMAL HIGH (ref 70–99)
Glucose-Capillary: 68 mg/dL — ABNORMAL LOW (ref 70–99)

## 2022-08-25 LAB — HEPATITIS B SURFACE ANTIGEN: Hepatitis B Surface Ag: NONREACTIVE

## 2022-08-25 LAB — HEPARIN LEVEL (UNFRACTIONATED): Heparin Unfractionated: 0.2 IU/mL — ABNORMAL LOW (ref 0.30–0.70)

## 2022-08-25 LAB — HEMOGLOBIN A1C
Hgb A1c MFr Bld: 6.1 % — ABNORMAL HIGH (ref 4.8–5.6)
Mean Plasma Glucose: 128 mg/dL

## 2022-08-25 MED ORDER — APIXABAN 5 MG PO TABS
5.0000 mg | ORAL_TABLET | Freq: Two times a day (BID) | ORAL | Status: DC
  Administered 2022-08-25 – 2022-08-27 (×5): 5 mg via ORAL
  Filled 2022-08-25 (×5): qty 1

## 2022-08-25 MED ORDER — OXYCODONE HCL 5 MG/5ML PO SOLN
5.0000 mg | ORAL | Status: DC | PRN
  Administered 2022-08-25 – 2022-08-27 (×2): 5 mg
  Filled 2022-08-25 (×2): qty 5

## 2022-08-25 MED ORDER — INSULIN ASPART 100 UNIT/ML IJ SOLN
0.0000 [IU] | INTRAMUSCULAR | Status: DC
  Administered 2022-08-25: 1 [IU] via SUBCUTANEOUS
  Administered 2022-08-25 – 2022-08-26 (×2): 2 [IU] via SUBCUTANEOUS
  Administered 2022-08-26 – 2022-08-28 (×5): 1 [IU] via SUBCUTANEOUS
  Administered 2022-08-28: 2 [IU] via SUBCUTANEOUS
  Administered 2022-08-28: 1 [IU] via SUBCUTANEOUS
  Administered 2022-08-29: 2 [IU] via SUBCUTANEOUS
  Administered 2022-08-29 – 2022-08-30 (×3): 1 [IU] via SUBCUTANEOUS
  Administered 2022-08-30: 2 [IU] via SUBCUTANEOUS
  Administered 2022-08-30: 1 [IU] via SUBCUTANEOUS

## 2022-08-25 MED ORDER — LIDOCAINE-PRILOCAINE 2.5-2.5 % EX CREA: 1.0000 | TOPICAL_CREAM | CUTANEOUS | Status: DC | PRN

## 2022-08-25 MED ORDER — DEXTROSE 50 % IV SOLN
12.5000 g | INTRAVENOUS | Status: AC
  Administered 2022-08-25: 12.5 g via INTRAVENOUS
  Filled 2022-08-25: qty 50

## 2022-08-25 MED ORDER — AMLODIPINE BESYLATE 10 MG PO TABS
10.0000 mg | ORAL_TABLET | Freq: Every day | ORAL | Status: DC
  Administered 2022-08-25 – 2022-08-30 (×5): 10 mg via ORAL
  Filled 2022-08-25 (×5): qty 1

## 2022-08-25 MED ORDER — LOSARTAN POTASSIUM 50 MG PO TABS
100.0000 mg | ORAL_TABLET | Freq: Every day | ORAL | Status: DC
  Administered 2022-08-25 – 2022-08-30 (×5): 100 mg via ORAL
  Filled 2022-08-25 (×5): qty 2

## 2022-08-25 MED ORDER — HYDRALAZINE HCL 20 MG/ML IJ SOLN: 10.0000 mg | Freq: Four times a day (QID) | INTRAMUSCULAR | Status: DC | PRN

## 2022-08-25 MED ORDER — LIDOCAINE HCL (PF) 1 % IJ SOLN: 5.0000 mL | INTRAMUSCULAR | Status: DC | PRN

## 2022-08-25 MED ORDER — CHLORHEXIDINE GLUCONATE CLOTH 2 % EX PADS
6.0000 | MEDICATED_PAD | Freq: Every day | CUTANEOUS | Status: DC
  Administered 2022-08-26 – 2022-08-27 (×2): 6 via TOPICAL

## 2022-08-25 MED ORDER — HEPARIN SODIUM (PORCINE) 1000 UNIT/ML DIALYSIS
1000.0000 [IU] | INTRAMUSCULAR | Status: DC | PRN
  Administered 2022-08-26: 3800 [IU]
  Filled 2022-08-25: qty 1

## 2022-08-25 MED ORDER — PENTAFLUOROPROP-TETRAFLUOROETH EX AERO: 1.0000 | INHALATION_SPRAY | CUTANEOUS | Status: DC | PRN

## 2022-08-25 MED ORDER — ALTEPLASE 2 MG IJ SOLR: 2.0000 mg | Freq: Once | INTRAMUSCULAR | Status: DC | PRN

## 2022-08-25 MED ORDER — ANTICOAGULANT SODIUM CITRATE 4% (200MG/5ML) IV SOLN: 5.0000 mL | Status: DC | PRN

## 2022-08-25 NOTE — Progress Notes (Signed)
Patient ID: [name], male   DOB: 10/08/1945, 77 y.o.   MRN: [id number] [facility] Progress Note   Assessment/ Plan:   1.  Mechanical fall with fracture of the left seventh/eighth and possibly 12th ribs: Originally transferred for suspicion of flail chest however this was ruled out by trauma surgery.  Ongoing efforts at symptom management. 2. ESRD: Continue peritoneal dialysis with cautious increase of ultrafiltration for volume to 1.8 L. Seen on PD and currently still hooked up, waiting for full drain. He's resting comfortably with no complaints (appears more comfortable than yesterday). 5sec pause overnight 4/2 and Clonidine and bblr held.  Appreciate VIR placing the [facility] TC on 4/2 -> plan on starting iHD tomorrow. Will hold PD tonight.  Renal navigator aware of pt and will assist w/ placement as he will be headed to a SNF. Will hopefully be able to get a weekly flush at his HD center while in a SNF (will try to set that up).  3. Anemia: Currently with stable hemoglobin/hematocrit after having coffee-ground emesis last week, concerns raised regarding Mallory-Weiss tear versus upper GI bleed with no immediate plans for EGD. 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  Switched from sevelamer to [place] (phos now 5.5 on 4/3). 5. Nutrition: Continue renal diet with fluid restriction/phosphorus restriction. 6.  Acute hypoxic respiratory failure: Suspected to be likely secondary to aspiration pneumonia in the setting of recurrent emesis/intolerance to PD fill volume.  Started on intravenous Unasyn with improvement of leukocytosis but continued dependency on oxygen supplementation/hypoxia noted.    Subjective:   Tolerated 1.8 L for volume with PD overnight with 4.25%; 5sec pause overnight.    Objective:   BP (!) 136/50 (BP Location: Left Arm)   Pulse (!) 53   Temp 98.2 F (36.8 C) (Oral)   Resp (!) 22   Ht 5'  6" (1.676 m)   Wt 69.6 kg   SpO2 92%   BMI 24.77 kg/m   Physical Exam: Gen: Fatigued resting in bed; mildly tachypneic. On oxygen via nasal cannula CVS: Pulse regular rhythm, normal rate, S1 and S2 normal Resp: Poor inspiratory effort with decreased breath sounds over bases, no rales/rhonchi Abd: Soft, slightly distended, bowel sounds normal Ext: No lower extremity edema  Labs: BMET Recent Labs  Lab 08/18/22 2140 08/19/22 0929 08/19/22 0929 08/20/22 0041 08/21/22 [id number] 08/22/22 0825 08/23/22 0750 08/23/22 2145 08/24/22 0952 08/24/22 1653  NA 137 136  --  135 135 135 136  --  137  --   K 4.7 4.9  --  4.8 4.9 5.2* 4.3  --  4.0  --   CL 102 103  --  101 99 100 101  --  101  --   CO2 24 22  --  22 23 21* 23  --  25  --   GLUCOSE 203* 72  --  77 60* 92 165*  --  109*  --   BUN 49* 50*  --  52* 70* 75* 75*  --  76*  --   CREATININE 8.09* 8.18*  --  8.24* 8.79* 8.73* 8.61*  --  8.05*  --   CALCIUM 8.1* 7.5*  --  7.6* 7.9* 7.9* 8.0*  --  7.9*  --   PHOS  --  6.5*   < > 6.8* 7.3* 6.9* 6.9* 6.0* 5.4* 5.5*   < > = values in this interval not displayed.   CBC Recent Labs  Lab 08/18/22 2140 08/19/22 0929 08/21/22 0651 08/22/22 0825 08/23/22 0750 08/24/22 0952  WBC 19.1*   < > 9.5 11.5* 12.6* 10.7*  NEUTROABS 17.3*  --   --   --   --  8.3*  HGB 9.8*   < > 8.9* 9.3* 10.0* 9.5*  HCT 29.0*   < > 27.7* 28.1* 30.4* 28.4*  MCV 86.6   < > 88.5 87.3 87.9 86.6  PLT 165   < > 196 204 225 200   < > = values in this interval not displayed.      Medications:     amiodarone  200 mg Oral Daily   amLODipine  10 mg Oral Daily   Chlorhexidine Gluconate Cloth  6 each Topical Daily   doxazosin  8 mg Oral Daily   feeding supplement (PROSource TF20)  60 mL Per Tube Daily   fentaNYL (SUBLIMAZE) injection  25 mcg Intravenous Once   ferric citrate  420 mg Oral TID WC   free water  100 mL Per Tube TID   gentamicin cream  1 Application Topical Daily   insulin aspart  0-6 Units Subcutaneous  Q4H   lidocaine  1 patch Transdermal QHS   losartan  50 mg Oral Daily   multivitamin  1 tablet Per Tube QHS   nicotine  21 mg Transdermal Daily   pantoprazole  40 mg Intravenous Q12H   polyethylene glycol  17 g Oral BID   sennosides  15 mL Oral BID

## 2022-08-25 NOTE — Plan of Care (Signed)

## 2022-08-25 NOTE — Progress Notes (Signed)
PROGRESS NOTE        PATIENT DETAILS Name: [name] Age: 77 y.o. Sex: male Date of Birth: 23-Jul-1945 Admit Date: 08/17/2022 Admitting Physician Emilee Hero, MD [id number], Aris Everts, MD  Brief Summary: Patient is a 77 y.o.  male with history of ESRD on peritoneal dialysis, A-fib on Eliquis, HTN, HLD-who was transferred from [facility] for concern for a flail chest following a fall with rib fracture.  Hospital course complicated by intractable vomiting-leading to upper GI bleeding from presumed Mallory-Weiss tear, aspiration pneumonia, severe debility/deconditioning.  See below for further details.  Significant events: 3/25-3/26>> hospitalization at [facility] following a fall-concern for flail chest-transferred to [facility] at [facility] for trauma evaluation. 3/27>> abdominal pain following PD vol fill. Numerous episodes of vomiting evening-into night-vomitus became coffee-ground-some intermittent hypoxia. NPO-PPI infusion started.Renal MD decreasing PD vol fill. 3/31>> Per GI-no EGD-okay to restart anticoagulation.  IV heparin started.  SLP eval-keep n.p.o. 4/01>>Cortrak tube inserted 4/03>> sinus pauses on telemetry-bisoprolol/transdermal clonidine discontinued.  Significant studies: 3/25>> CT chest: Nondisplaced left seventh/eighth rib fracture, possible 12th left rib fracture. 3/25>> CT abdomen/pelvis: Extensive pneumoperitoneum-in the setting of PD catheter.  6.4 cm left cystic lesion-suggestive of a hemorrhagic cyst.  Significant microbiology data: 3/28>> blood cultures: No growth  Procedures: None  Consults: Nephrology Trauma surgery GI Palliative  Subjective: No major issues overnight-lying comfortably in bed.  Does not have pain in his chest today.  Objective: Vitals: Blood pressure (!) 135/52, pulse (!) 58, temperature 97.9 F (36.6 C), temperature source Oral, resp. rate 19, height 5\' 6"  (1.676 m), weight 69.6 kg, SpO2 94 %.   Exam: Gen Exam:Alert  awake-not in any distress HEENT:atraumatic, normocephalic Chest: B/L clear to auscultation anteriorly CVS:S1S2 regular Abdomen:soft non tender, non distended Extremities:no edema Neurology: Non focal Skin: no rash  Pertinent Labs/Radiology:    Latest Ref Rng & Units 08/25/2022    9:53 AM 08/24/2022    9:52 AM 08/23/2022    7:50 AM  CBC  WBC 4.0 - 10.5 K/uL 11.1  10.7  12.6   Hemoglobin 13.0 - 17.0 g/dL 8.6  9.5  10.0   Hematocrit 39.0 - 52.0 % 26.1  28.4  30.4   Platelets 150 - 400 K/uL 177  200  225     Lab Results  Component Value Date   NA 138 08/25/2022   K 3.7 08/25/2022   CL 99 08/25/2022   CO2 25 08/25/2022      Assessment/Plan: Mechanical fall with left seventh, eighth and possible 12th rib fracture Overall much better-significantly less pain Continue multimodal pain regimen Pulmonary tolerating Mobilization with PT/OT Trauma team has signed off.    Intractable nausea/vomiting Upper GI bleed with coffee-ground emesis-?  Mallory-Weiss tear Nausea/vomiting/coffee-ground emesis resolved.  This was felt to be due to narcotics/excessive PD fill volume.  Narcotics were minimized-PD volume was adjusted by nephrology. Hemoglobin stable-not felt to be actively bleeding Per GI note on 3/31-no role for endoscopy as bleeding seems to have completely resolved IV heparin restarted-switch to Eliquis 4/2.  Acute hypoxic respiratory failure Multifactorial-initially due to rib fractures/splinting-then developed aspiration pneumonia-likely due to pulmonary edema in the setting of ineffective peritoneal dialysis. Stable on around 2-3 L of oxygen.   Continue to treat underlying etiologies is much as possible  Aspiration pneumonia Much improved Leukocytosis improved. Cultures negative so far IV Unasyn x 5 days total completed  4/1  Oropharyngeal dysphagia Unfortunately has developed severe deconditioning-causing oropharyngeal dysphagia-therapy recommended n.p.o. status  Cortrak  tube placed on 4/1-hopefully we can get him optimized over the next few days and attempt to rechallenge with oral intake over the next several days.  SLP following.  Left renal hemorrhagic cyst Incidental finding on CT abdomen Discussed with renal MD-no reason to continue holding Eliquis for this issue-as hemoglobin stable.  ESRD on peritoneal dialysis Hyperkalemia BUNs/creatinine remain on the higher side Nephrology following and adjusting PD regimen Since has developed severe debility/deconditioning due to acute illness-and will require SNF-he will need to be temporarily switched over to hemodialysis.  TDC is 4/2-will begin HD starting tomorrow-PD will be held starting 4/3.  Acute urinary retention  Occurred on 3/28 when patient had vomiting-extensive pain issues Foley catheter removed on 3/31  Continue to watch for urinary retention.  PAF Sinus rhythm on telemetry Continue amiodarone Eliquis initially on hold-IV heparin started 3/31-without any signs of rebleeding-switch to Eliquis 4/2.    Sinus pauses Occurred overnight Asymptomatic Stopping beta-blocker/transdermal clonidine-telemetry monitoring and see how he does.  Acute on chronic chronic HFrEF See above Volume removal with dialysis . CAD (LHC 06/30/2022-two-vessel disease-70-80% mid LAD stenosis, chronic total occlusion of RCA with left-right collaterals-optimization of medical therapy recommended) Not on antiplatelets as he is on Eliquis Intolerant to statin Currently without any anginal symptoms-his left-sided musculoskeletal chest pain is from rib fractures.  HTN BP reasonable Bisoprolol/transdermal clonidine stopped on 4/3 due to sinus pauses-dosage of amlodipineincreased to 10 mg, losartan increased to 100 mg-remains on doxazosin. Follow for now.  Debility/deconditioning Likely due to acute illness-has had significant decline in functional status over the past several days PT/OT-now recommending SNF Multiple  issues/setbacks during this hospitalization-now with severe failure to thrive-will ask palliative care to evaluate-remains a full code as of now.  Briefly discussed with patient-he is agreeable for palliative care  Nutrition Status: Nutrition Problem: Moderate Malnutrition Etiology: chronic illness Signs/Symptoms: severe muscle depletion, mild fat depletion Interventions: Tube feeding, MVI    BMI: Estimated body mass index is 24.77 kg/m as calculated from the following:   Height as of this encounter: 5\' 6"  (1.676 m).   Weight as of this encounter: 69.6 kg.   Code status:   Code Status: Full Code   DVT Prophylaxis: Place TED hose Start: 08/17/22 2148 apixaban (ELIQUIS) tablet 5 mg    Family Communication: Rogue Diederich [phone] 4/2   Disposition Plan: Status is: Inpatient Remains inpatient appropriate because: Severity of illness   Planned Discharge Destination:Home health   Diet: Diet Order             Diet NPO time specified  Diet effective now                     Antimicrobial agents: Anti-infectives (From admission, onward)    Start     Dose/Rate Route Frequency Ordered Stop   08/24/22 1505  ceFAZolin (ANCEF) IVPB 2g/100 mL premix        over 30 Minutes Intravenous Continuous PRN 08/24/22 1538 08/24/22 1505   08/24/22 1315  ceFAZolin (ANCEF) IVPB 2g/100 mL premix        2 g 200 mL/hr over 30 Minutes Intravenous To Radiology 08/24/22 1225 08/25/22 1315   08/19/22 1100  Ampicillin-Sulbactam (UNASYN) 3 g in sodium chloride 0.9 % 100 mL IVPB        3 g 200 mL/hr over 30 Minutes Intravenous Every 24 hours 08/19/22 0957 08/23/22 1141  MEDICATIONS: Scheduled Meds:  amiodarone  200 mg Oral Daily   amLODipine  10 mg Oral Daily   apixaban  5 mg Oral BID   Chlorhexidine Gluconate Cloth  6 each Topical Daily   Chlorhexidine Gluconate Cloth  6 each Topical Q0600   doxazosin  8 mg Oral Daily   feeding supplement (PROSource TF20)  60 mL  Per Tube Daily   fentaNYL (SUBLIMAZE) injection  25 mcg Intravenous Once   ferric citrate  420 mg Oral TID WC   free water  100 mL Per Tube TID   gentamicin cream  1 Application Topical Daily   insulin aspart  0-6 Units Subcutaneous Q4H   lidocaine  1 patch Transdermal QHS   losartan  100 mg Oral Daily   multivitamin  1 tablet Per Tube QHS   nicotine  21 mg Transdermal Daily   pantoprazole  40 mg Intravenous Q12H   polyethylene glycol  17 g Oral BID   sennosides  15 mL Oral BID   Continuous Infusions:  dialysis solution 4.25% low-MG/low-CA     feeding supplement (OSMOLITE 1.5 CAL) 50 mL/hr at 08/25/22 0400   heparin 1,400 Units/hr (08/25/22 0400)   promethazine (PHENERGAN) injection (IM or IVPB) Stopped (08/18/22 1826)   promethazine (PHENERGAN) injection (IM or IVPB)     PRN Meds:.acetaminophen, bisacodyl, fentaNYL (SUBLIMAZE) injection, hydrALAZINE, lactulose, metoCLOPramide (REGLAN) injection, naLOXone (NARCAN)  injection, ondansetron **OR** ondansetron (ZOFRAN) IV, oxyCODONE, promethazine **OR** promethazine (PHENERGAN) injection (IM or IVPB) **OR** promethazine, promethazine (PHENERGAN) injection (IM or IVPB)   I have personally reviewed following labs and imaging studies  LABORATORY DATA: CBC: Recent Labs  Lab 08/18/22 2140 08/19/22 0929 08/21/22 0651 08/22/22 0825 08/23/22 0750 08/24/22 0952 08/25/22 0953  WBC 19.1*   < > 9.5 11.5* 12.6* 10.7* 11.1*  NEUTROABS 17.3*  --   --   --   --  8.3*  --   HGB 9.8*   < > 8.9* 9.3* 10.0* 9.5* 8.6*  HCT 29.0*   < > 27.7* 28.1* 30.4* 28.4* 26.1*  MCV 86.6   < > 88.5 87.3 87.9 86.6 87.3  PLT 165   < > 196 204 225 200 177   < > = values in this interval not displayed.     Basic Metabolic Panel: Recent Labs  Lab 08/21/22 0651 08/22/22 0825 08/23/22 0750 08/23/22 2145 08/24/22 0952 08/24/22 1653 08/25/22 0954  NA 135 135 136  --  137  --  138  K 4.9 5.2* 4.3  --  4.0  --  3.7  CL 99 100 101  --  101  --  99  CO2 23  21* 23  --  25  --  25  GLUCOSE 60* 92 165*  --  109*  --  153*  BUN 70* 75* 75*  --  76*  --  77*  CREATININE 8.79* 8.73* 8.61*  --  8.05*  --  8.19*  CALCIUM 7.9* 7.9* 8.0*  --  7.9*  --  8.1*  MG  --   --   --  2.0 1.9 2.0 2.0  PHOS 7.3* 6.9* 6.9* 6.0* 5.4* 5.5* 5.1*     GFR: Estimated Creatinine Clearance: 6.9 mL/min (A) (by C-G formula based on SCr of 8.19 mg/dL (H)).  Liver Function Tests: Recent Labs  Lab 08/18/22 2140 08/19/22 [id number] 08/21/22 [id number] 08/22/22 0825 08/23/22 0750 08/24/22 0952 08/25/22 0954  AST 13*  --   --   --   --   --   --  ALT 13  --   --   --   --   --   --   ALKPHOS 122  --   --   --   --   --   --   BILITOT 0.6  --   --   --   --   --   --   PROT 4.9*  --   --   --   --   --   --   ALBUMIN 1.9*   < > <1.5* <1.5* 1.5* <1.5* <1.5*   < > = values in this interval not displayed.    No results for input(s): "LIPASE", "AMYLASE" in the last 168 hours. No results for input(s): "AMMONIA" in the last 168 hours.  Coagulation Profile: No results for input(s): "INR", "PROTIME" in the last 168 hours.   Cardiac Enzymes: No results for input(s): "CKTOTAL", "CKMB", "CKMBINDEX", "TROPONINI" in the last 168 hours.  BNP (last 3 results) No results for input(s): "PROBNP" in the last 8760 hours.  Lipid Profile: No results for input(s): "CHOL", "HDL", "LDLCALC", "TRIG", "CHOLHDL", "LDLDIRECT" in the last 72 hours.  Thyroid Function Tests: No results for input(s): "TSH", "T4TOTAL", "FREET4", "T3FREE", "THYROIDAB" in the last 72 hours.  Anemia Panel: No results for input(s): "VITAMINB12", "FOLATE", "FERRITIN", "TIBC", "IRON", "RETICCTPCT" in the last 72 hours.  Urine analysis:    Component Value Date/Time   COLORURINE STRAW (A) 11/28/2020 1055   APPEARANCEUR CLEAR (A) 11/28/2020 1055   APPEARANCEUR Clear 01/03/2012 1117   LABSPEC 1.008 11/28/2020 1055   LABSPEC 1.004 01/03/2012 1117   PHURINE 7.0 11/28/2020 1055   GLUCOSEU 50 (A) 11/28/2020 1055    GLUCOSEU 100 (A) 01/21/2020 1223   HGBUR NEGATIVE 11/28/2020 1055   BILIRUBINUR NEGATIVE 11/28/2020 1055   BILIRUBINUR neg 07/30/2013 1127   BILIRUBINUR Negative 01/03/2012 1117   KETONESUR NEGATIVE 11/28/2020 1055   PROTEINUR 30 (A) 11/28/2020 1055   UROBILINOGEN 0.2 01/21/2020 1223   NITRITE NEGATIVE 11/28/2020 1055   LEUKOCYTESUR NEGATIVE 11/28/2020 1055   LEUKOCYTESUR Negative 01/03/2012 1117    Sepsis Labs: Lactic Acid, Venous    Component Value Date/Time   LATICACIDVEN 0.8 08/19/2022 0929    MICROBIOLOGY: Recent Results (from the past 240 hour(s))  Culture, blood (Routine X 2) w Reflex to ID Panel     Status: None   Collection Time: 08/19/22  9:29 AM   Specimen: BLOOD LEFT HAND  Result Value Ref Range Status   Specimen Description BLOOD LEFT HAND  Final   Special Requests   Final    BOTTLES DRAWN AEROBIC AND ANAEROBIC Blood Culture adequate volume   Culture   Final    NO GROWTH 5 DAYS Performed at [facility], [place] [address] [place], [place] [address]    Report Status 08/24/2022 FINAL  Final  Culture, blood (Routine X 2) w Reflex to ID Panel     Status: None   Collection Time: 08/19/22  9:34 AM   Specimen: BLOOD LEFT HAND  Result Value Ref Range Status   Specimen Description BLOOD LEFT HAND  Final   Special Requests   Final    BOTTLES DRAWN AEROBIC AND ANAEROBIC Blood Culture adequate volume   Culture   Final    NO GROWTH 5 DAYS Performed at [facility], [place] [address], [place], [place] [address]    Report Status 08/24/2022 FINAL  Final    RADIOLOGY STUDIES/RESULTS: DG Chest Port 1 View  Result Date: 08/25/2022 CLINICAL DATA:  Shortness of breath.  Follow-up. EXAM: PORTABLE CHEST 1 VIEW COMPARISON:  08/21/2022 FINDINGS: Soft feeding tube enters the abdomen. Right internal jugular central line tip is at the SVC RA junction cardiomegaly persists with left ventricular prominence. Aortic atherosclerosis persists. Persistent effusions with volume loss  in both lower lobes. Allowing for technical factors, this findings appear similar to the prior exam. IMPRESSION: No significant change. Persistent effusions with volume loss in both lower lobes. Cardiomegaly with left ventricular prominence. Central line tip at the [facility] RA junction. Soft feeding tube enters the abdomen. Electronically Signed   By: Nelson Chimes M.D.   On: 08/25/2022 08:03   IR Fluoro Guide CV Line Right  Result Date: 08/24/2022 INDICATION: Acute kidney injury, no current access for dialysis EXAM: ULTRASOUND GUIDANCE FOR VASCULAR ACCESS RIGHT INTERNAL JUGULAR PERMANENT HEMODIALYSIS CATHETER Date:  08/24/2022 08/24/2022 3:33 pm; 08/24/2022 3:47 pm Radiologist:  Jerilynn Mages. Daryll Brod, MD Guidance:  Ultrasound and fluoroscopic FLUOROSCOPY: Fluoroscopy Time: 0 minutes 30 seconds (3.0 mGy). MEDICATIONS: 2 g Ancef within 1 hour of the procedure ANESTHESIA/SEDATION: Versed 0 mg IV; Fentanyl 50 mcg IV; Moderate Sedation Time:  12 minutes The patient was continuously monitored during the procedure by the interventional radiology nurse under my direct supervision. CONTRAST:  None. COMPLICATIONS: None immediate. PROCEDURE: Informed consent was obtained from the patient following explanation of the procedure, risks, benefits and alternatives. The patient understands, agrees and consents for the procedure. All questions were addressed. A time out was performed. Maximal barrier sterile technique utilized including caps, mask, sterile gowns, sterile gloves, large sterile drape, hand hygiene, and 2% chlorhexidine scrub. Under sterile conditions and local anesthesia, right internal jugular micropuncture venous access was performed with ultrasound. Images were obtained for documentation of the patent right internal jugular vein. A guide wire was inserted followed by a transitional dilator. Next, a 0.035 guidewire was advanced into the IVC with a 5-French catheter. Measurements were obtained from the right venotomy site to the  proximal right atrium. In the right infraclavicular chest, a subcutaneous tunnel was created under sterile conditions and local anesthesia. 1% lidocaine with epinephrine was utilized for this. The 23 cm tip to cuff palindrome catheter was tunneled subcutaneously to the venotomy site and inserted into the SVC/RA junction through a valved peel-away sheath. Position was confirmed with fluoroscopy. Images were obtained for documentation. Blood was aspirated from the catheter followed by saline and heparin flushes. The appropriate volume and strength of heparin was instilled in each lumen. Caps were applied. The catheter was secured at the tunnel site with Gelfoam and a pursestring suture. The venotomy site was closed with subcuticular Vicryl suture. Dermabond was applied to the small right neck incision. A dry sterile dressing was applied. The catheter is ready for use. No immediate complications. IMPRESSION: Ultrasound and fluoroscopically guided right internal jugular tunneled hemodialysis catheter (23 cm tip to cuff palindrome catheter). Electronically Signed   By: Jerilynn Mages.  Shick M.D.   On: 08/24/2022 16:10   IR US Guide Vasc Access Right  Result Date: 08/24/2022 INDICATION: Acute kidney injury, no current access for dialysis EXAM: ULTRASOUND GUIDANCE FOR VASCULAR ACCESS RIGHT INTERNAL JUGULAR PERMANENT HEMODIALYSIS CATHETER Date:  08/24/2022 08/24/2022 3:33 pm; 08/24/2022 3:47 pm Radiologist:  Jerilynn Mages. Daryll Brod, MD Guidance:  Ultrasound and fluoroscopic FLUOROSCOPY: Fluoroscopy Time: 0 minutes 30 seconds (3.0 mGy). MEDICATIONS: 2 g Ancef within 1 hour of the procedure ANESTHESIA/SEDATION: Versed 0 mg IV; Fentanyl 50 mcg IV; Moderate Sedation Time:  12 minutes The patient was continuously monitored during the  procedure by the interventional radiology nurse under my direct supervision. CONTRAST:  None. COMPLICATIONS: None immediate. PROCEDURE: Informed consent was obtained from the patient following explanation of the  procedure, risks, benefits and alternatives. The patient understands, agrees and consents for the procedure. All questions were addressed. A time out was performed. Maximal barrier sterile technique utilized including caps, mask, sterile gowns, sterile gloves, large sterile drape, hand hygiene, and 2% chlorhexidine scrub. Under sterile conditions and local anesthesia, right internal jugular micropuncture venous access was performed with ultrasound. Images were obtained for documentation of the patent right internal jugular vein. A guide wire was inserted followed by a transitional dilator. Next, a 0.035 guidewire was advanced into the IVC with a 5-French catheter. Measurements were obtained from the right venotomy site to the proximal right atrium. In the right infraclavicular chest, a subcutaneous tunnel was created under sterile conditions and local anesthesia. 1% lidocaine with epinephrine was utilized for this. The 23 cm tip to cuff palindrome catheter was tunneled subcutaneously to the venotomy site and inserted into the SVC/RA junction through a valved peel-away sheath. Position was confirmed with fluoroscopy. Images were obtained for documentation. Blood was aspirated from the catheter followed by saline and heparin flushes. The appropriate volume and strength of heparin was instilled in each lumen. Caps were applied. The catheter was secured at the tunnel site with Gelfoam and a pursestring suture. The venotomy site was closed with subcuticular Vicryl suture. Dermabond was applied to the small right neck incision. A dry sterile dressing was applied. The catheter is ready for use. No immediate complications. IMPRESSION: Ultrasound and fluoroscopically guided right internal jugular tunneled hemodialysis catheter (23 cm tip to cuff palindrome catheter). Electronically Signed   By: Jerilynn Mages.  Shick M.D.   On: 08/24/2022 16:10   DG Abd Portable 1V  Result Date: 08/23/2022 CLINICAL DATA:  Encounter for feeding tube  placement EXAM: PORTABLE ABDOMEN - 1 VIEW COMPARISON:  Portable exam 1420 hours compared to 08/19/2022 FINDINGS: Tip of feeding tube projects over distal gastric antrum. Enlargement of cardiac silhouette. Atherosclerotic calcification and tortuosity of thoracic aorta. Bibasilar pulmonary infiltrates and RIGHT pleural effusion. Visualized bowel gas pattern in upper abdomen unremarkable. IMPRESSION: Tip of feeding tube projects over distal gastric antrum. Electronically Signed   By: Lavonia Dana M.D.   On: 08/23/2022 15:10     LOS: 8 days   Oren Binet, MD  [name]    To contact the attending provider between 7A-7P or the covering provider during after hours 7P-7A, please log into the web site [email or web address] and access using universal [place] password for that web site. If you do not have the password, please call the hospital operator.  08/25/2022, 1:36 PM

## 2022-08-25 NOTE — Progress Notes (Signed)
Speech Language Pathology Treatment: Dysphagia  Patient Details Name: [name] MRN: [id number] DOB: August 09, 1945 Today's Date: 08/25/2022 Time: [id number] SLP Time Calculation (min) (ACUTE ONLY): 25 min  Assessment / Plan / Recommendation Clinical Impression  [name] was alert, fatigued.  He was provided with toothbrush/toothpaste and able to brush his teeth independently. He accepted ice chips, single teaspoons of water with cues for effortful swallow.  There is persisting throat-clearing and mild cough with most boluses.  He c/o throat hurting and after several swallows declined further PO. Encouraged him to continue to eat ice chips in order to keep the swallowing muscles working as well as maintain healthy oral mucosa (in combination with oral care).  Overall, pt continues to be weak and swallow appears deconditioned. Continue NPO and encourage ice chips ( 1/4 cup of chips left at his bedside- D/W RN). SLP will follow. D/W Dr. Sloan Leiter.   HPI HPI: Pt is a 77 y.o. male who presented after a fall. Pt with nondisplaced L 7th and 8th rib fractures. GI consulted due to coffee-ground emesis, and nausea. Hematemesis thought to be due to Mallory-Weiss tear which resolved and EGD thought not be needed and GI signed off 3/31. SLP consulted 3/31 due to RN's report of pt having difficulty initiating the swallow and coughing thereafter. Pt was on full liquids since 3/29, but has been cleared for advancement by Dr. Nena Alexander. PMH: CHF, A-fib, ESRD on peritoneal dialysis, hypertension, hyperlipidemia, CVA.      SLP Plan  Continue with current plan of care      Recommendations for follow up therapy are one component of a multi-disciplinary discharge planning process, led by the attending physician.  Recommendations may be updated based on patient status, additional functional criteria and insurance authorization.    Recommendations  Diet recommendations: NPO   Encourage ice chips. Encourage pt to  complete his own oral care with supervision.               Oral care QID;Oral care prior to ice chip/H20     Dysphagia, oropharyngeal phase (R13.12)     Continue with current plan of care     [name]  08/25/2022, 12:25 PM Brett Bamberg L. Tivis Ringer, MA CCC/SLP Clinical Specialist - [place] Office number [phone]

## 2022-08-25 NOTE — Progress Notes (Signed)
ANTICOAGULATION CONSULT NOTE - Follow Up Consult  Pharmacy Consult for heparin Indication: atrial fibrillation  Labs: Recent Labs    08/23/22 0750 08/23/22 2125 08/24/22 0950 08/24/22 0952 08/25/22 0953  HGB 10.0*  --   --  9.5* 8.6*  HCT 30.4*  --   --  28.4* 26.1*  PLT 225  --   --  200 177  HEPARINUNFRC 0.18* 0.13* 0.23*  --  0.20*  CREATININE 8.61*  --   --  8.05*  --      Assessment: 76yo male subtherapeutic on heparin after rate change; no infusion issues or signs of bleeding per RN.  Goal of Therapy:  Heparin level 0.3-0.5 units/ml   Plan:  Will increase heparin infusion cautiously by 2 units/kg/hr to 1500 units/hr  Heparin infusion to stop on 08/25/2022 at 1800 Start apixaban 5 mg po bid 08/25/2022 at [place] BS, PharmD, BCPS Clinical Pharmacist 08/25/2022 12:22 PM  Contact: [phone] after 3 PM  "Be curious, not judgmental..." -Jamal Maes

## 2022-08-25 NOTE — Progress Notes (Signed)
  X-cover Note: Bedside RN sent secure chat that pt has been having sinus pauses. Longest pause of 5 secs. Hold orders already on bisoprolol. Pt on amiodarone 200 mg daily.   Kristopher Oppenheim, DO [name]

## 2022-08-25 NOTE — TOC Initial Note (Signed)
Transition of Care [facility]) - Initial/Assessment Note    Patient Details  Name: [name] MRN: [id number] Date of Birth: 01-20-1946  [facility]) CM/SW Contact:    Benard Halsted, LCSW Phone Number: 08/25/2022, 3:02 PM  Clinical Narrative:                 CSW spoke with patient's spouse to discuss overall discharge plan and need for SNF placement. Spouse shared that she would like for patient to be comfortable and not endure any more suffering and be put through dialysis and a SNF. She stated she would like for patient to go to Hospice in [place] if he is able. CSW made her aware that it will be discussed with Palliative care and the MD.     Barriers to Discharge: Continued Medical Work up   Patient Goals and CMS Choice Patient states their goals for this hospitalization and ongoing recovery are:: Comfort CMS Medicare.gov Compare Post Acute Care list provided to:: Patient Represenative (must comment) Choice offered to / list presented to : [place] ownership interest in [facility].provided to:: Spouse    Expected Discharge Plan and Services In-house Referral: Clinical Social Work, Hospice / [place] arrangements for the past 2 months: Single Family Home                                      Prior Living Arrangements/Services Living arrangements for the past 2 months: Single Family Home Lives with:: Spouse Patient language and need for interpreter reviewed:: Yes Do you feel safe going back to the place where you live?: Yes      Need for Family Participation in Patient Care: Yes (Comment) Care giver support system in place?: Yes (comment)   Criminal Activity/Legal Involvement Pertinent to Current Situation/Hospitalization: No - Comment as needed  Activities of Daily Living Home Assistive Devices/Equipment: Blood pressure cuff, BIPAP, Dentures (specify type), Grab bars in shower, Hearing aid, Scales ADL Screening  (condition at time of admission) Patient's cognitive ability adequate to safely complete daily activities?: Yes Is the patient deaf or have difficulty hearing?: Yes Does the patient have difficulty seeing, even when wearing glasses/contacts?: No Does the patient have difficulty concentrating, remembering, or making decisions?: No Patient able to express need for assistance with ADLs?: Yes Does the patient have difficulty dressing or bathing?: Yes Independently performs ADLs?: No Communication: Independent Dressing (OT): Needs assistance Grooming: Needs assistance Feeding: Needs assistance Is this a change from baseline?: Change from baseline, expected to last <3 days Bathing: Needs assistance Is this a change from baseline?: Change from baseline, expected to last >3 days Toileting: Needs assistance In/Out Bed: Needs assistance Walks in Home: Independent Does the patient have difficulty walking or climbing stairs?: Yes Weakness of Legs: Both Weakness of Arms/Hands: Both  Permission Sought/Granted Permission sought to share information with : Facility Sport and exercise psychologist, Family Supports Permission granted to share information with : No  Share Information with NAME: Shirlean Mylar     Permission granted to share info w Relationship: Spouse  Permission granted to share info w Contact Information: [phone]  Emotional Assessment Appearance:: Appears stated age Attitude/Demeanor/Rapport: Unable to Assess Affect (typically observed): Unable to Assess Orientation: : Oriented to Self, Oriented to Situation, Oriented to Place Alcohol / Substance Use: Not Applicable Psych Involvement: No (comment)  Admission diagnosis:  Flail chest [S22.5XXA] Patient Active  Problem List   Diagnosis Date Noted   Malnutrition of moderate degree 08/24/2022   Coffee ground emesis 08/19/2022   Anticoagulated 08/19/2022   Aspiration pneumonia 08/19/2022   Acute respiratory failure with hypoxia 08/16/2022    Flail chest 08/16/2022   Pain 08/16/2022   Rib fracture 08/16/2022   Gait disturbance 08/16/2022   Hepatomegaly 08/16/2022   Renal cyst, native, hemorrhage 08/16/2022   Volume overload 01/17/2022   Pruritus 01/17/2022   Xerosis of skin 04/21/2021   Gynecomastia 04/20/2021   Hypertensive emergency 04/04/2021   Carotid stenosis 02/17/2021   Benign prostatic hyperplasia with lower urinary tract symptoms 02/08/2021   Acquired complex renal cyst 02/08/2021   Atrial fibrillation and flutter 11/28/2020   COVID-19 virus infection 11/28/2020   Vitamin D deficiency 11/23/2020   Rectal bleeding 11/06/2020   Pneumoperitoneum 11/06/2020   Resistant hypertension 11/06/2020   Diabetes mellitus with ESRD (end-stage renal disease) 11/06/2020   CAD (coronary artery disease) 11/06/2020   Anemia 11/06/2020   Anemia due to blood loss 11/06/2020   Hypotension, iatrogenic 08/20/2020   ESRD on peritoneal dialysis 08/06/2020   HFrEF (heart failure with reduced ejection fraction)    Paroxysmal atrial fibrillation    Anemia of chronic disease    Hypertensive urgency 05/11/2020   Chronic HFrEF (heart failure with reduced ejection fraction) 05/11/2020   Fluid overload 05/11/2020   End stage renal disease 02/19/2020   Aortic atherosclerosis 01/22/2020   Hospital discharge follow-up 01/22/2020   Aortic arch atherosclerosis 01/22/2020   Neurologic deficit as late effect of ischemic cerebrovascular accident (CVA) 01/15/2020   Dissection of left subclavian artery    Cerebrovascular accident (CVA) 01/14/2020   Constipation 05/13/2019   Cardiomyopathy 04/26/2019   Coronary artery disease involving native coronary artery of native heart without angina pectoris 04/26/2019   ESRD (end stage renal disease) 04/26/2019   Chronic pain not due to malignancy 04/12/2019   Chronic bilateral low back pain with bilateral sciatica 11/27/2018   Synovial cyst of lumbar spine 11/27/2018   Uncontrolled hypertension  11/14/2018   CKD (chronic kidney disease), stage V 11/14/2018   Major depressive disorder with current active episode 07/26/2017   Anemia of chronic kidney failure, stage 4 (severe) 07/26/2017   DOE (dyspnea on exertion) 11/25/2016   S/P UVPP (uvulopalatopharyngoplasty) 08/22/2014   Presbyacusis 08/22/2014   Adenocarcinoma, renal cell 02/26/2014   Renal cell carcinoma 02/26/2014   Malignant neoplasm of kidney excluding renal pelvis 02/26/2014   Hyperkalemia 11/08/2013   Prostate cancer ([place]) 07/30/2013   Elevated serum alkaline phosphatase level 07/30/2013   Malignant neoplasm of prostate 07/30/2013   Hypertensive renal sclerosis with hypertension 10/18/2012   Renal sclerosis with hypertension 0000000   Complication of diabetes mellitus 07/16/2012   PAD (peripheral artery disease) ([place])    Left hip pain 04/20/2012   Erectile dysfunction 04/20/2012   Hip pain 04/20/2012   tobacco abuse    Temporary cerebral vascular dysfunction 01/15/2012   Tobacco abuse counseling 05/06/2011   Counseling on substance use and abuse 05/06/2011   Hyperlipidemia    History of renal cell carcinoma    PCP:  Crecencio Mc, MD Pharmacy:   [facility] [address], [address] [place] [address] Phone: [phone] Fax: [facility], [address] [place] [address] Phone: [phone] Fax: [phone]     Social Determinants of Health (SDOH) Social History: SDOH Screenings  Food Insecurity: No Food Insecurity (08/17/2022)  Housing: Low Risk  (08/17/2022)  Transportation Needs: No Transportation Needs (08/17/2022)  Utilities: Not At Risk (08/17/2022)  Depression (PHQ2-9): Low Risk  (04/22/2022)  Financial Resource Strain: Low Risk  (04/22/2022)  Physical Activity: Insufficiently Active (04/22/2022)  Social Connections: Unknown (04/22/2022)  Stress: No Stress Concern Present (04/22/2022)   Tobacco Use: High Risk (08/24/2022)   SDOH Interventions:     Readmission Risk Interventions    08/25/2022    3:01 PM 08/25/2022    9:06 AM 04/03/2021    9:35 AM  Readmission Risk Prevention Plan  Transportation Screening Complete Complete Complete  Medication Review Press photographer)  Complete Complete  PCP or Specialist appointment within 3-5 days of discharge  Complete Complete  HRI or Home Care Consult  Complete Complete  SW Recovery Care/Counseling Consult  Complete Complete  Palliative Care Screening  Complete Not Applicable  [place]  Patient Refused Complete

## 2022-08-25 NOTE — Progress Notes (Signed)
Palliative:  HPI: 77 y.o. male  with past medical history of CHF, atrial fibrillation, ESRD on peritoneal dialysis, hypertension, hyperlipidemia, prior stroke admitted on 08/17/2022 with chest pain related to fall with rib fractures L 7-8 and possible L 12 fractures. Also noted to have 6 cm hemorrhagic renal cyst. Concern for upper GIB now resolved - holding on EGD due to no further emesis or bleeding. Being treated for aspiration pneumonia. Palliative care consulted for goals of care in the setting of acute illness and underlying decline in functional status and overall failure to thrive.   I met today at [name]'s bedside but no family/visitors and he is sleeping soundly. No distress. Discussed with Dr. Sloan Leiter.  I called and spoke with wife, [name]. [name] shares that she would like to move forward with comfort and hospice. She reports that Khyden told his pastor yesterday ~midday that he knew he was approaching end of life and he was at peace and "ready to go." I reviewed with [name] my conversation with Daiton later in the day after she left the hospital and he was very clear that he desired full measures to prolong his life even resuscitation. [name] was very surprised as this is not consistent with what Alam has expressed to her previously or what he told the pastor. She knows that he has been suffering and has spoken with her about stopping dialysis previously. Robin and I agree that we will continue care for now based on his stated wishes. [name] will come to bedside to discuss further with Orpah Greek tomorrow what he wants. [name] is supportive of his decisions but does fear that he is suffering and may not fully understand what he is requesting.   All questions/concerns addressed. Emotional support provided. Updated Dr. Sloan Leiter and [place].   Exam: Sleeping soundly. No distress. I did not awaken. VSS. Breathing regular, unlabored. Abd soft.   Plan:  - Full code, full scope for now.  - Wife to come  to bedside to further discuss goals of care and wishes with patient.   [place], NP Palliative Medicine Team Pager [phone] (Please see [email or web address] for schedule) Team Phone [phone]    Greater than 50%  of this time was spent counseling and coordinating care related to the above assessment and plan

## 2022-08-26 DIAGNOSIS — N186 End stage renal disease: Secondary | ICD-10-CM | POA: Diagnosis not present

## 2022-08-26 DIAGNOSIS — Z7189 Other specified counseling: Secondary | ICD-10-CM | POA: Diagnosis not present

## 2022-08-26 DIAGNOSIS — Z515 Encounter for palliative care: Secondary | ICD-10-CM | POA: Diagnosis not present

## 2022-08-26 LAB — RENAL FUNCTION PANEL
Albumin: <1.5 g/dL — ABNORMAL LOW (ref 3.5–5.0)
Anion gap: 16 — ABNORMAL HIGH (ref 5–15)
BUN: 89 mg/dL — ABNORMAL HIGH (ref 8–23)
CO2: 25 mmol/L (ref 22–32)
Calcium: 8.3 mg/dL — ABNORMAL LOW (ref 8.9–10.3)
Chloride: 97 mmol/L — ABNORMAL LOW (ref 98–111)
Creatinine, Ser: 8.53 mg/dL — ABNORMAL HIGH (ref 0.61–1.24)
GFR, Estimated: 6 mL/min — ABNORMAL LOW (ref >60)
Glucose, Bld: 194 mg/dL — ABNORMAL HIGH (ref 70–99)
Phosphorus: 4.6 mg/dL (ref 2.5–4.6)
Potassium: 4.2 mmol/L (ref 3.5–5.1)
Sodium: 138 mmol/L (ref 135–145)

## 2022-08-26 LAB — GLUCOSE, CAPILLARY
Glucose-Capillary: 157 mg/dL — ABNORMAL HIGH (ref 70–99)
Glucose-Capillary: 185 mg/dL — ABNORMAL HIGH (ref 70–99)
Glucose-Capillary: 174 mg/dL — ABNORMAL HIGH (ref 70–99)
Glucose-Capillary: 160 mg/dL — ABNORMAL HIGH (ref 70–99)

## 2022-08-26 LAB — HEPATITIS B SURFACE ANTIBODY, QUANTITATIVE: Hep B S AB Quant (Post): <3.5 m[IU]/mL — ABNORMAL LOW (ref >9.9)

## 2022-08-26 NOTE — Progress Notes (Signed)
Patient ID: [name], male   DOB: 1946-03-22, 77 y.o.   MRN: [id number] [facility] Progress Note   Assessment/ Plan:   1.  Mechanical fall with fracture of the left seventh/eighth and possibly 12th ribs: Originally transferred for suspicion of flail chest however this was ruled out by trauma surgery.  Ongoing efforts at symptom management. 2. ESRD: Had gently and slowly incr volume fill (up to 1.8L) peritoneal dialysis given pain initially + mallory weiss tear. 5sec pause evening of 4/2 -> Clonidine and bblr held.  Appreciate VIR placing the RIJ TC on 4/2 -> plan on starting iHD today 1st shift. Hold PD and will have staff flush weekly while he's here (last fill was evening of 4/2).  Renal navigator aware of pt and will assist w/ placement as he will be headed to a SNF. Will hopefully be able to get a weekly flush at his HD center while in a SNF (will try to set that up).  3. Anemia: Currently with stable hemoglobin/hematocrit after having coffee-ground emesis last week, concerns raised regarding Mallory-Weiss tear versus upper GI bleed with no immediate plans for EGD. 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  Switched from sevelamer to Auryxia (phos now 5.5 on 4/3). 5. Nutrition: Continue renal diet with fluid restriction/phosphorus restriction. 6.  Acute hypoxic respiratory failure: Suspected to be likely secondary to aspiration pneumonia in the setting of recurrent emesis/intolerance to PD fill volume.  Started on intravenous Unasyn with improvement of leukocytosis but continued dependency on oxygen supplementation/hypoxia noted.    Subjective:   Tolerated 1.8 L for volume with PD but stopped on 4/3 (last fills overnight on 4/2). A lot of secretions and he is awake and alert.    Objective:   BP (!) 153/50 (BP Location: Left Arm)   Pulse (!) 53   Temp 97.6 F (36.4 C) (Axillary)   Resp 18   Ht 5\' 6"   (1.676 m)   Wt 70.3 kg   SpO2 99%   BMI 25.01 kg/m   Physical Exam: Gen: Fatigued resting in bed. On oxygen via nasal cannula CVS: Pulse regular rhythm, normal rate, S1 and S2 normal Resp: Poor inspiratory effort with decreased breath sounds over bases, no rales/rhonchi Abd: Soft, slightly distended, bowel sounds normal Ext: No lower extremity edema Access: RIJ TC (4/2)  Labs: BMET Recent Labs  Lab 08/19/22 0929 08/20/22 0041 08/21/22 [id number] 08/22/22 0825 08/23/22 0750 08/23/22 2145 08/24/22 0952 08/24/22 1653 08/25/22 0954  NA 136 135 135 135 136  --  137  --  138  K 4.9 4.8 4.9 5.2* 4.3  --  4.0  --  3.7  CL 103 101 99 100 101  --  101  --  99  CO2 22 22 23  21* 23  --  25  --  25  GLUCOSE 72 77 60* 92 165*  --  109*  --  153*  BUN 50* 52* 70* 75* 75*  --  76*  --  77*  CREATININE 8.18* 8.24* 8.79* 8.73* 8.61*  --  8.05*  --  8.19*  CALCIUM 7.5* 7.6* 7.9* 7.9* 8.0*  --  7.9*  --  8.1*  PHOS 6.5* 6.8* 7.3* 6.9* 6.9* 6.0* 5.4* 5.5* 5.1*   CBC Recent Labs  Lab 08/22/22 0825 08/23/22 0750 08/24/22 0952 08/25/22 0953  WBC 11.5* 12.6* 10.7* 11.1*  NEUTROABS  --   --  8.3*  --   HGB 9.3*  10.0* 9.5* 8.6*  HCT 28.1* 30.4* 28.4* 26.1*  MCV 87.3 87.9 86.6 87.3  PLT 204 225 200 177      Medications:     amiodarone  200 mg Oral Daily   amLODipine  10 mg Oral Daily   apixaban  5 mg Oral BID   Chlorhexidine Gluconate Cloth  6 each Topical Daily   Chlorhexidine Gluconate Cloth  6 each Topical Q0600   doxazosin  8 mg Oral Daily   feeding supplement (PROSource TF20)  60 mL Per Tube Daily   fentaNYL (SUBLIMAZE) injection  25 mcg Intravenous Once   ferric citrate  420 mg Oral TID WC   free water  100 mL Per Tube TID   gentamicin cream  1 Application Topical Daily   insulin aspart  0-6 Units Subcutaneous Q4H   lidocaine  1 patch Transdermal QHS   losartan  100 mg Oral Daily   multivitamin  1 tablet Per Tube QHS   nicotine  21 mg Transdermal Daily   pantoprazole  40 mg  Intravenous Q12H   polyethylene glycol  17 g Oral BID   sennosides  15 mL Oral BID

## 2022-08-26 NOTE — Progress Notes (Signed)
PT Cancellation Note  Patient Details Name: PHILANDER SHUCK MRN: [id number] DOB: [birth year]   Cancelled Treatment:    Reason Eval/Treat Not Completed: (P) Patient at procedure or test/unavailable, pt off unit at HD. Will check back as schedule allows to continue with PT POC.  Audry Riles. PTA Acute Rehabilitation Services Office: [place] 08/26/2022, 9:18 AM

## 2022-08-26 NOTE — Progress Notes (Signed)
SLP Cancellation Note  Patient Details Name: [name] MRN: [id number] DOB: Jan 22, 1946   Cancelled treatment:       Reason Eval/Treat Not Completed: Patient at procedure or test/unavailable (Pt currently at HD. SLP will follow up later as schedule allows.)  Daulton Harbaugh I. Hardin Negus, [place], [place] Office number [phone]  Horton Marshall 08/26/2022, 8:45 AM

## 2022-08-26 NOTE — Progress Notes (Signed)
PROGRESS NOTE        PATIENT DETAILS Name: [name] Age: 77 y.o. Sex: male Date of Birth: 05-11-1946 Admit Date: 08/17/2022 Admitting Physician Emilee Hero, MD [id number], Aris Everts, MD  Brief Summary: Patient is a 77 y.o.  male with history of ESRD on peritoneal dialysis, A-fib on Eliquis, HTN, HLD-who was transferred from [facility] for concern for a flail chest following a fall with rib fracture.  Hospital course complicated by intractable vomiting-leading to upper GI bleeding from presumed Mallory-Weiss tear, aspiration pneumonia, severe debility/deconditioning.  See below for further details.  Significant events: 3/25-3/26>> hospitalization at [facility] following a fall-concern for flail chest-transferred to [facility] for trauma evaluation. 3/27>> abdominal pain following PD vol fill. Numerous episodes of vomiting evening-into night-vomitus became coffee-ground-some intermittent hypoxia. NPO-PPI infusion started.Renal MD decreasing PD vol fill. 3/31>> Per GI-no EGD-okay to restart anticoagulation.  IV heparin started.  SLP eval-keep n.p.o. 4/01>>Cortrak tube inserted 4/03>> sinus pauses on telemetry-bisoprolol/transdermal clonidine discontinued.  Significant studies: 3/25>> CT chest: Nondisplaced left seventh/eighth rib fracture, possible 12th left rib fracture. 3/25>> CT abdomen/pelvis: Extensive pneumoperitoneum-in the setting of PD catheter.  6.4 cm left cystic lesion-suggestive of a hemorrhagic cyst.  Significant microbiology data: 3/28>> blood cultures: No growth  Procedures: None  Consults: Nephrology Trauma surgery GI Palliative  Subjective: Unchanged-no major issues overnight.  Seen before he left for hemodialysis.  Answering simple questions appropriately-on just 2 L of oxygen.  Objective: Vitals: Blood pressure (!) 104/48, pulse 63, temperature (!) 97.5 F (36.4 C), temperature source Oral, resp. rate 18, height 5\' 6"  (1.676 m), weight  69.8 kg, SpO2 99 %.   Exam: Gen Exam:Alert awake-not in any distress HEENT:atraumatic, normocephalic Chest: B/L clear to auscultation anteriorly CVS:S1S2 regular Abdomen:soft non tender, non distended Extremities:no edema Neurology: Non focal Skin: no rash  Pertinent Labs/Radiology:    Latest Ref Rng & Units 08/25/2022    9:53 AM 08/24/2022    9:52 AM 08/23/2022    7:50 AM  CBC  WBC 4.0 - 10.5 K/uL 11.1  10.7  12.6   Hemoglobin 13.0 - 17.0 g/dL 8.6  9.5  10.0   Hematocrit 39.0 - 52.0 % 26.1  28.4  30.4   Platelets 150 - 400 K/uL 177  200  225     Lab Results  Component Value Date   NA 138 08/26/2022   K 4.2 08/26/2022   CL 97 (L) 08/26/2022   CO2 25 08/26/2022     Assessment/Plan: Mechanical fall with left seventh, eighth and possible 12th rib fracture Overall much better-significantly less pain Continue multimodal pain regimen Pulmonary tolerating Mobilization with PT/OT Trauma team has signed off.    Intractable nausea/vomiting Upper GI bleed with coffee-ground emesis-?  Mallory-Weiss tear Nausea/vomiting/coffee-ground emesis resolved.  This was felt to be due to narcotics/excessive PD fill volume.  Narcotics were minimized-PD volume was adjusted by nephrology. He stabilized with supportive care-no significant drop in hemoglobin.  GI was consulted-however on 3/31-GI signed off-no role for endoscopy as bleeding stopped Tolerating resumption of anticoagulation without any issues.  Acute hypoxic respiratory failure Multifactorial-initially due to rib fractures/splinting-then developed aspiration pneumonia-likely due to pulmonary edema in the setting of ineffective peritoneal dialysis. Stable on just 2 L of oxygen. Continue to treat underlying etiologies is much as possible  Aspiration pneumonia Much improved Leukocytosis improved. Cultures negative so far IV Unasyn  x 5 days total completed 4/1  Oropharyngeal dysphagia Unfortunately has developed severe  deconditioning-causing oropharyngeal dysphagia-therapy recommended n.p.o. status  Cortrak tube placed on 4/1-hopefully we can get him optimized over the next few days and attempt to rechallenge with oral intake over the next several days.  SLP following.  Left renal hemorrhagic cyst Incidental finding on CT abdomen Discussed with renal MD-no reason to continue holding Eliquis for this issue-as hemoglobin stable.  ESRD on peritoneal dialysis Hyperkalemia BUNs/creatinine remain on the higher side Since he will require SNF-he has been switched to hemodialysis-first HD on 4/4 Nephrology follow urine.  Acute urinary retention  Occurred on 3/28 when patient had vomiting-extensive pain issues Foley catheter removed on 3/31  Continue to watch for urinary retention.  PAF Sinus rhythm on telemetry Continue amiodarone Eliquis initially on hold-IV heparin started 3/31-without any signs of rebleeding-switch to Eliquis 4/2.    Sinus pauses Occurred overnight on 4/3 Asymptomatic-he was sleeping Beta-blocker/transdermal clonidine discontinued-telemetry stable overnight.  Acute on chronic chronic HFrEF See above Volume removal with dialysis . CAD (LHC 06/30/2022-two-vessel disease-70-80% mid LAD stenosis, chronic total occlusion of RCA with left-right collaterals-optimization of medical therapy recommended) Not on antiplatelets as he is on Eliquis Intolerant to statin Currently without any anginal symptoms-his left-sided musculoskeletal chest pain is from rib fractures.  HTN BP reasonable Bisoprolol/transdermal clonidine stopped on 4/3 due to sinus pauses-dosage of continue amlodipine/losartan/doxazosin-he is starting HD 4/4 and may need adjustment of his antihypertensive regimen post HD Follow.  Debility/deconditioning Likely due to acute illness-has had significant decline in functional status over the past several days PT/OT-now recommending SNF Multiple issues/setbacks during this  hospitalization-now with severe failure to thrive-will ask palliative care to evaluate-remains a full code as of now.  Briefly discussed with patient-he is agreeable for palliative care  Nutrition Status: Nutrition Problem: Moderate Malnutrition Etiology: chronic illness Signs/Symptoms: severe muscle depletion, mild fat depletion Interventions: Tube feeding, MVI    BMI: Estimated body mass index is 24.84 kg/m as calculated from the following:   Height as of this encounter: 5\' 6"  (1.676 m).   Weight as of this encounter: 69.8 kg.   Code status:   Code Status: Full Code   DVT Prophylaxis: Place TED hose Start: 08/17/22 2148 apixaban (ELIQUIS) tablet 5 mg    Family Communication: Dan Mickel [phone] 4/4   Disposition Plan: Status is: Inpatient Remains inpatient appropriate because: Severity of illness   Planned Discharge Destination:SNF   Diet: Diet Order             Diet NPO time specified  Diet effective now                     Antimicrobial agents: Anti-infectives (From admission, onward)    Start     Dose/Rate Route Frequency Ordered Stop   08/24/22 1505  ceFAZolin (ANCEF) IVPB 2g/100 mL premix        over 30 Minutes Intravenous Continuous PRN 08/24/22 1538 08/24/22 1505   08/24/22 1315  ceFAZolin (ANCEF) IVPB 2g/100 mL premix        2 g 200 mL/hr over 30 Minutes Intravenous To Radiology 08/24/22 1225 08/25/22 1315   08/19/22 1100  Ampicillin-Sulbactam (UNASYN) 3 g in sodium chloride 0.9 % 100 mL IVPB        3 g 200 mL/hr over 30 Minutes Intravenous Every 24 hours 08/19/22 0957 08/23/22 1141        MEDICATIONS: Scheduled Meds:  amiodarone  200 mg Oral Daily  amLODipine  10 mg Oral Daily   apixaban  5 mg Oral BID   Chlorhexidine Gluconate Cloth  6 each Topical Daily   Chlorhexidine Gluconate Cloth  6 each Topical Q0600   doxazosin  8 mg Oral Daily   feeding supplement (PROSource TF20)  60 mL Per Tube Daily   fentaNYL (SUBLIMAZE)  injection  25 mcg Intravenous Once   ferric citrate  420 mg Oral TID WC   free water  100 mL Per Tube TID   gentamicin cream  1 Application Topical Daily   insulin aspart  0-6 Units Subcutaneous Q4H   lidocaine  1 patch Transdermal QHS   losartan  100 mg Oral Daily   multivitamin  1 tablet Per Tube QHS   nicotine  21 mg Transdermal Daily   pantoprazole  40 mg Intravenous Q12H   polyethylene glycol  17 g Oral BID   sennosides  15 mL Oral BID   Continuous Infusions:  anticoagulant sodium citrate     dialysis solution 4.25% low-MG/low-CA     feeding supplement (OSMOLITE 1.5 CAL) 50 mL/hr at 08/26/22 0733   promethazine (PHENERGAN) injection (IM or IVPB) Stopped (08/18/22 1826)   promethazine (PHENERGAN) injection (IM or IVPB)     PRN Meds:.acetaminophen, alteplase, anticoagulant sodium citrate, bisacodyl, fentaNYL (SUBLIMAZE) injection, heparin, hydrALAZINE, lactulose, lidocaine (PF), lidocaine-prilocaine, metoCLOPramide (REGLAN) injection, naLOXone (NARCAN)  injection, ondansetron **OR** ondansetron (ZOFRAN) IV, oxyCODONE, pentafluoroprop-tetrafluoroeth, promethazine **OR** promethazine (PHENERGAN) injection (IM or IVPB) **OR** promethazine, promethazine (PHENERGAN) injection (IM or IVPB)   I have personally reviewed following labs and imaging studies  LABORATORY DATA: CBC: Recent Labs  Lab 08/21/22 0651 08/22/22 0825 08/23/22 0750 08/24/22 0952 08/25/22 0953  WBC 9.5 11.5* 12.6* 10.7* 11.1*  NEUTROABS  --   --   --  8.3*  --   HGB 8.9* 9.3* 10.0* 9.5* 8.6*  HCT 27.7* 28.1* 30.4* 28.4* 26.1*  MCV 88.5 87.3 87.9 86.6 87.3  PLT 196 204 225 200 177     Basic Metabolic Panel: Recent Labs  Lab 08/22/22 0825 08/23/22 0750 08/23/22 2145 08/24/22 0952 08/24/22 1653 08/25/22 0954 08/26/22 0745  NA 135 136  --  137  --  138 138  K 5.2* 4.3  --  4.0  --  3.7 4.2  CL 100 101  --  101  --  99 97*  CO2 21* 23  --  25  --  25 25  GLUCOSE 92 165*  --  109*  --  153* 194*  BUN  75* 75*  --  76*  --  77* 89*  CREATININE 8.73* 8.61*  --  8.05*  --  8.19* 8.53*  CALCIUM 7.9* 8.0*  --  7.9*  --  8.1* 8.3*  MG  --   --  2.0 1.9 2.0 2.0  --   PHOS 6.9* 6.9* 6.0* 5.4* 5.5* 5.1* 4.6     GFR: Estimated Creatinine Clearance: 6.6 mL/min (A) (by C-G formula based on SCr of 8.53 mg/dL (H)).  Liver Function Tests: Recent Labs  Lab 08/22/22 0825 08/23/22 0750 08/24/22 0952 08/25/22 0954 08/26/22 0745  ALBUMIN <1.5* 1.5* <1.5* <1.5* <1.5*    No results for input(s): "LIPASE", "AMYLASE" in the last 168 hours. No results for input(s): "AMMONIA" in the last 168 hours.  Coagulation Profile: No results for input(s): "INR", "PROTIME" in the last 168 hours.   Cardiac Enzymes: No results for input(s): "CKTOTAL", "CKMB", "CKMBINDEX", "TROPONINI" in the last 168 hours.  BNP (last 3 results) No  results for input(s): "PROBNP" in the last 8760 hours.  Lipid Profile: No results for input(s): "CHOL", "HDL", "LDLCALC", "TRIG", "CHOLHDL", "LDLDIRECT" in the last 72 hours.  Thyroid Function Tests: No results for input(s): "TSH", "T4TOTAL", "FREET4", "T3FREE", "THYROIDAB" in the last 72 hours.  Anemia Panel: No results for input(s): "VITAMINB12", "FOLATE", "FERRITIN", "TIBC", "IRON", "RETICCTPCT" in the last 72 hours.  Urine analysis:    Component Value Date/Time   COLORURINE STRAW (A) 11/28/2020 1055   APPEARANCEUR CLEAR (A) 11/28/2020 1055   APPEARANCEUR Clear 01/03/2012 1117   LABSPEC 1.008 11/28/2020 1055   LABSPEC 1.004 01/03/2012 1117   PHURINE 7.0 11/28/2020 1055   GLUCOSEU 50 (A) 11/28/2020 1055   GLUCOSEU 100 (A) 01/21/2020 1223   HGBUR NEGATIVE 11/28/2020 1055   BILIRUBINUR NEGATIVE 11/28/2020 1055   BILIRUBINUR neg 07/30/2013 1127   BILIRUBINUR Negative 01/03/2012 1117   KETONESUR NEGATIVE 11/28/2020 1055   PROTEINUR 30 (A) 11/28/2020 1055   UROBILINOGEN 0.2 01/21/2020 1223   NITRITE NEGATIVE 11/28/2020 1055   LEUKOCYTESUR NEGATIVE 11/28/2020 1055    LEUKOCYTESUR Negative 01/03/2012 1117    Sepsis Labs: Lactic Acid, Venous    Component Value Date/Time   LATICACIDVEN 0.8 08/19/2022 0929    MICROBIOLOGY: Recent Results (from the past 240 hour(s))  Culture, blood (Routine X 2) w Reflex to ID Panel     Status: None   Collection Time: 08/19/22  9:29 AM   Specimen: BLOOD LEFT HAND  Result Value Ref Range Status   Specimen Description BLOOD LEFT HAND  Final   Special Requests   Final    BOTTLES DRAWN AEROBIC AND ANAEROBIC Blood Culture adequate volume   Culture   Final    NO GROWTH 5 DAYS Performed at [facility], [place] [address] [place], [place] [address]    Report Status 08/24/2022 FINAL  Final  Culture, blood (Routine X 2) w Reflex to ID Panel     Status: None   Collection Time: 08/19/22  9:34 AM   Specimen: BLOOD LEFT HAND  Result Value Ref Range Status   Specimen Description BLOOD LEFT HAND  Final   Special Requests   Final    BOTTLES DRAWN AEROBIC AND ANAEROBIC Blood Culture adequate volume   Culture   Final    NO GROWTH 5 DAYS Performed at [facility], [place] [address] [place], [place] [address]    Report Status 08/24/2022 FINAL  Final    RADIOLOGY STUDIES/RESULTS: DG Chest Port 1 View  Result Date: 08/25/2022 CLINICAL DATA:  Shortness of breath.  Follow-up. EXAM: PORTABLE CHEST 1 VIEW COMPARISON:  08/21/2022 FINDINGS: Soft feeding tube enters the abdomen. Right internal jugular central line tip is at the SVC RA junction cardiomegaly persists with left ventricular prominence. Aortic atherosclerosis persists. Persistent effusions with volume loss in both lower lobes. Allowing for technical factors, this findings appear similar to the prior exam. IMPRESSION: No significant change. Persistent effusions with volume loss in both lower lobes. Cardiomegaly with left ventricular prominence. Central line tip at the [facility] RA junction. Soft feeding tube enters the abdomen. Electronically Signed   By: Nelson Chimes M.D.    On: 08/25/2022 08:03   IR Fluoro Guide CV Line Right  Result Date: 08/24/2022 INDICATION: Acute kidney injury, no current access for dialysis EXAM: ULTRASOUND GUIDANCE FOR VASCULAR ACCESS RIGHT INTERNAL JUGULAR PERMANENT HEMODIALYSIS CATHETER Date:  08/24/2022 08/24/2022 3:33 pm; 08/24/2022 3:47 pm Radiologist:  Jerilynn Mages. Daryll Brod, MD Guidance:  Ultrasound and fluoroscopic FLUOROSCOPY: Fluoroscopy Time: 0  minutes 30 seconds (3.0 mGy). MEDICATIONS: 2 g Ancef within 1 hour of the procedure ANESTHESIA/SEDATION: Versed 0 mg IV; Fentanyl 50 mcg IV; Moderate Sedation Time:  12 minutes The patient was continuously monitored during the procedure by the interventional radiology nurse under my direct supervision. CONTRAST:  None. COMPLICATIONS: None immediate. PROCEDURE: Informed consent was obtained from the patient following explanation of the procedure, risks, benefits and alternatives. The patient understands, agrees and consents for the procedure. All questions were addressed. A time out was performed. Maximal barrier sterile technique utilized including caps, mask, sterile gowns, sterile gloves, large sterile drape, hand hygiene, and 2% chlorhexidine scrub. Under sterile conditions and local anesthesia, right internal jugular micropuncture venous access was performed with ultrasound. Images were obtained for documentation of the patent right internal jugular vein. A guide wire was inserted followed by a transitional dilator. Next, a 0.035 guidewire was advanced into the IVC with a 5-French catheter. Measurements were obtained from the right venotomy site to the proximal right atrium. In the right infraclavicular chest, a subcutaneous tunnel was created under sterile conditions and local anesthesia. 1% lidocaine with epinephrine was utilized for this. The 23 cm tip to cuff palindrome catheter was tunneled subcutaneously to the venotomy site and inserted into the SVC/RA junction through a valved peel-away sheath. Position was  confirmed with fluoroscopy. Images were obtained for documentation. Blood was aspirated from the catheter followed by saline and heparin flushes. The appropriate volume and strength of heparin was instilled in each lumen. Caps were applied. The catheter was secured at the tunnel site with Gelfoam and a pursestring suture. The venotomy site was closed with subcuticular Vicryl suture. Dermabond was applied to the small right neck incision. A dry sterile dressing was applied. The catheter is ready for use. No immediate complications. IMPRESSION: Ultrasound and fluoroscopically guided right internal jugular tunneled hemodialysis catheter (23 cm tip to cuff palindrome catheter). Electronically Signed   By: Jerilynn Mages.  Shick M.D.   On: 08/24/2022 16:10   IR US Guide Vasc Access Right  Result Date: 08/24/2022 INDICATION: Acute kidney injury, no current access for dialysis EXAM: ULTRASOUND GUIDANCE FOR VASCULAR ACCESS RIGHT INTERNAL JUGULAR PERMANENT HEMODIALYSIS CATHETER Date:  08/24/2022 08/24/2022 3:33 pm; 08/24/2022 3:47 pm Radiologist:  Jerilynn Mages. Daryll Brod, MD Guidance:  Ultrasound and fluoroscopic FLUOROSCOPY: Fluoroscopy Time: 0 minutes 30 seconds (3.0 mGy). MEDICATIONS: 2 g Ancef within 1 hour of the procedure ANESTHESIA/SEDATION: Versed 0 mg IV; Fentanyl 50 mcg IV; Moderate Sedation Time:  12 minutes The patient was continuously monitored during the procedure by the interventional radiology nurse under my direct supervision. CONTRAST:  None. COMPLICATIONS: None immediate. PROCEDURE: Informed consent was obtained from the patient following explanation of the procedure, risks, benefits and alternatives. The patient understands, agrees and consents for the procedure. All questions were addressed. A time out was performed. Maximal barrier sterile technique utilized including caps, mask, sterile gowns, sterile gloves, large sterile drape, hand hygiene, and 2% chlorhexidine scrub. Under sterile conditions and local anesthesia, right  internal jugular micropuncture venous access was performed with ultrasound. Images were obtained for documentation of the patent right internal jugular vein. A guide wire was inserted followed by a transitional dilator. Next, a 0.035 guidewire was advanced into the IVC with a 5-French catheter. Measurements were obtained from the right venotomy site to the proximal right atrium. In the right infraclavicular chest, a subcutaneous tunnel was created under sterile conditions and local anesthesia. 1% lidocaine with epinephrine was utilized for this. The 23 cm tip to  cuff palindrome catheter was tunneled subcutaneously to the venotomy site and inserted into the SVC/RA junction through a valved peel-away sheath. Position was confirmed with fluoroscopy. Images were obtained for documentation. Blood was aspirated from the catheter followed by saline and heparin flushes. The appropriate volume and strength of heparin was instilled in each lumen. Caps were applied. The catheter was secured at the tunnel site with Gelfoam and a pursestring suture. The venotomy site was closed with subcuticular Vicryl suture. Dermabond was applied to the small right neck incision. A dry sterile dressing was applied. The catheter is ready for use. No immediate complications. IMPRESSION: Ultrasound and fluoroscopically guided right internal jugular tunneled hemodialysis catheter (23 cm tip to cuff palindrome catheter). Electronically Signed   By: Jerilynn Mages.  Shick M.D.   On: 08/24/2022 16:10     LOS: 9 days   Oren Binet, MD  [name]    To contact the attending provider between 7A-7P or the covering provider during after hours 7P-7A, please log into the web site [email or web address] and access using universal [place] password for that web site. If you do not have the password, please call the hospital operator.  08/26/2022, 11:54 AM

## 2022-08-26 NOTE — Plan of Care (Signed)

## 2022-08-26 NOTE — Progress Notes (Signed)
Received patient in bed to unit.  Alert and oriented.  Informed consent signed and in chart.   [place] duration:3.5  Patient tolerated well.  Transported back to the room  Alert, without acute distress.  Hand-off given to patient's nurse.   Access used: right [facility] Access issues: none  Total UF removed: 2L Medication(s) given: none    08/26/22 1238  Vitals  Temp (!) 96.8 F (36 C)  Temp Source Oral  BP (!) 149/51  MAP (mmHg) 75  BP Location Left Arm  BP Method Automatic  Patient Position (if appropriate) Lying  Pulse Rate (!) 56  Pulse Rate Source Monitor  ECG Heart Rate (!) 57  Resp 15  Oxygen Therapy  SpO2 100 %  O2 Device Nasal Cannula  O2 Flow Rate (L/min) 3 L/min  During Treatment Monitoring  HD Safety Checks Performed Yes  Intra-Hemodialysis Comments Tx completed;Tolerated well  Dialysis Fluid Bolus Normal Saline  Bolus Amount (mL) 300 mL      Corrigan Kretschmer [facility]

## 2022-08-27 DIAGNOSIS — Z7189 Other specified counseling: Secondary | ICD-10-CM | POA: Diagnosis not present

## 2022-08-27 DIAGNOSIS — N186 End stage renal disease: Secondary | ICD-10-CM | POA: Diagnosis not present

## 2022-08-27 DIAGNOSIS — Z515 Encounter for palliative care: Secondary | ICD-10-CM | POA: Diagnosis not present

## 2022-08-27 LAB — GLUCOSE, CAPILLARY
Glucose-Capillary: 202 mg/dL — ABNORMAL HIGH (ref 70–99)
Glucose-Capillary: 184 mg/dL — ABNORMAL HIGH (ref 70–99)
Glucose-Capillary: 201 mg/dL — ABNORMAL HIGH (ref 70–99)
Glucose-Capillary: 202 mg/dL — ABNORMAL HIGH (ref 70–99)
Glucose-Capillary: 209 mg/dL — ABNORMAL HIGH (ref 70–99)
Glucose-Capillary: 181 mg/dL — ABNORMAL HIGH (ref 70–99)
Glucose-Capillary: 191 mg/dL — ABNORMAL HIGH (ref 70–99)
Glucose-Capillary: 175 mg/dL — ABNORMAL HIGH (ref 70–99)

## 2022-08-27 LAB — CBC
HCT: 27.8 % — ABNORMAL LOW (ref 39.0–52.0)
Hemoglobin: 8.9 g/dL — ABNORMAL LOW (ref 13.0–17.0)
MCH: 28.7 pg (ref 26.0–34.0)
MCHC: 32 g/dL (ref 30.0–36.0)
MCV: 89.7 fL (ref 80.0–100.0)
Platelets: 160 10*3/uL (ref 150–400)
RBC: 3.1 MIL/uL — ABNORMAL LOW (ref 4.22–5.81)
RDW: 15.7 % — ABNORMAL HIGH (ref 11.5–15.5)
WBC: 14.7 10*3/uL — ABNORMAL HIGH (ref 4.0–10.5)
nRBC: 0.1 % (ref 0.0–0.2)

## 2022-08-27 MED ORDER — CHLORHEXIDINE GLUCONATE CLOTH 2 % EX PADS
6.0000 | MEDICATED_PAD | Freq: Every day | CUTANEOUS | Status: DC
  Administered 2022-08-27 – 2022-08-30 (×4): 6 via TOPICAL

## 2022-08-27 NOTE — Progress Notes (Signed)
Patient ID: [name], male   DOB: Nov 02, 1945, 77 y.o.   MRN: [id number] [facility] Progress Note   Assessment/ Plan:   1.  Mechanical fall with fracture of the left seventh/eighth and possibly 12th ribs: Originally transferred for suspicion of flail chest however this was ruled out by trauma surgery.  Ongoing efforts at symptom management. 2. ESRD: Continue peritoneal dialysis with cautious increase of ultrafiltration for volume to 1.8 L. Seen on PD and currently still hooked up, waiting for full drain. He's resting comfortably with no complaints (appears more comfortable than yesterday). 5sec pause overnight 4/2 and Clonidine and bblr held.  Appreciate VIR placing the RIJ TC on 4/2 -> tolerated iHD #1 on 4/4. Next HD on Sat; he did very well on HD with 2L net UF. Breathing much more comfortably.  Will plan on flushing the PD Mon.  Renal navigator aware of pt and will assist w/ placement as he will be headed to a SNF. Will hopefully be able to get a weekly flush at his HD center while in a SNF (will try to set that up).  3. Anemia: Currently with stable hemoglobin/hematocrit after having coffee-ground emesis last week, concerns raised regarding Mallory-Weiss tear versus upper GI bleed with no immediate plans for EGD. 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  Switched from sevelamer to Auryxia (phos now 5.5 on 4/3). 5. Nutrition: Continue renal diet with fluid restriction/phosphorus restriction. 6.  Acute hypoxic respiratory failure: Suspected to be likely secondary to aspiration pneumonia in the setting of recurrent emesis/intolerance to PD fill volume.  Started on intravenous Unasyn with improvement of leukocytosis but continued dependency on oxygen supplementation/hypoxia noted.    Subjective:   Tolerated HD #1 yesterday; breathing much more comfortable.    Objective:   BP (!) 142/52 (BP Location: Left  Arm)   Pulse 64   Temp 98 F (36.7 C) (Oral)   Resp 17   Ht 5\' 6"  (1.676 m)   Wt 66.5 kg   SpO2 95%   BMI 23.66 kg/m   Physical Exam: Gen: Fatigued resting in bed; not tachypneic. On oxygen via nasal cannula CVS: Pulse regular rhythm, normal rate, S1 and S2 normal Resp: Poor inspiratory effort with decreased breath sounds over bases, no rales/rhonchi Abd: Soft, slightly distended, bowel sounds normal Ext: No lower extremity edema  Labs: BMET Recent Labs  Lab 08/21/22 0651 08/22/22 0825 08/23/22 0750 08/23/22 2145 08/24/22 0952 08/24/22 1653 08/25/22 0954 08/26/22 0745  NA 135 135 136  --  137  --  138 138  K 4.9 5.2* 4.3  --  4.0  --  3.7 4.2  CL 99 100 101  --  101  --  99 97*  CO2 23 21* 23  --  25  --  25 25  GLUCOSE 60* 92 165*  --  109*  --  153* 194*  BUN 70* 75* 75*  --  76*  --  77* 89*  CREATININE 8.79* 8.73* 8.61*  --  8.05*  --  8.19* 8.53*  CALCIUM 7.9* 7.9* 8.0*  --  7.9*  --  8.1* 8.3*  PHOS 7.3* 6.9* 6.9* 6.0* 5.4* 5.5* 5.1* 4.6   CBC Recent Labs  Lab 08/22/22 0825 08/23/22 0750 08/24/22 0952 08/25/22 0953  WBC 11.5* 12.6* 10.7* 11.1*  NEUTROABS  --   --  8.3*  --   HGB 9.3* 10.0* 9.5* 8.6*  HCT 28.1* 30.4* 28.4* 26.1*  MCV 87.3 87.9 86.6 87.3  PLT 204 225 200 177      Medications:     amiodarone  200 mg Oral Daily   amLODipine  10 mg Oral Daily   apixaban  5 mg Oral BID   Chlorhexidine Gluconate Cloth  6 each Topical Daily   Chlorhexidine Gluconate Cloth  6 each Topical Q0600   doxazosin  8 mg Oral Daily   feeding supplement (PROSource TF20)  60 mL Per Tube Daily   fentaNYL (SUBLIMAZE) injection  25 mcg Intravenous Once   ferric citrate  420 mg Oral TID WC   free water  100 mL Per Tube TID   gentamicin cream  1 Application Topical Daily   insulin aspart  0-6 Units Subcutaneous Q4H   lidocaine  1 patch Transdermal QHS   losartan  100 mg Oral Daily   multivitamin  1 tablet Per Tube QHS   nicotine  21 mg Transdermal Daily    pantoprazole  40 mg Intravenous Q12H   polyethylene glycol  17 g Oral BID   sennosides  15 mL Oral BID

## 2022-08-27 NOTE — TOC Progression Note (Signed)
Transition of Care [facility]) - Progression Note    Patient Details  Name: [name] MRN: [id number] Date of Birth: 1945-12-21  Transition of [facility]) CM/SW Contact  Mearl Latin, LCSW Phone Number: 08/27/2022, 9:30 AM  Clinical Narrative:    CSW following for medical progression. Once cortrak removed, can conduct SNF search and begin OP HD Clipp process in [place].    Expected Discharge Plan: Skilled Nursing Facility Barriers to Discharge: SNF Pending bed offer, Insurance Authorization, Continued Medical Work up (OP Dialysis Set up)  Expected Discharge Plan and Services In-house Referral: Clinical Social Work, Hospice / Palliative Care     Living arrangements for the past 2 months: Single Family Home                                       Social Determinants of Health (SDOH) Interventions SDOH Screenings   Food Insecurity: No Food Insecurity (08/18/2022)  Housing: Low Risk  (08/19/2022)  Transportation Needs: No Transportation Needs (07/25/2022)  Utilities: Not At Risk (07/23/2022)  Depression (PHQ2-9): Low Risk  (04/22/2022)  Financial Resource Strain: Low Risk  (04/22/2022)  Physical Activity: Insufficiently Active (04/22/2022)  Social Connections: Unknown (04/22/2022)  Stress: No Stress Concern Present (04/22/2022)  Tobacco Use: High Risk (08/24/2022)    Readmission Risk Interventions    08/25/2022    3:01 PM 08/25/2022    9:06 AM 04/03/2021    9:35 AM  Readmission Risk Prevention Plan  Transportation Screening Complete Complete Complete  Medication Review Oceanographer)  Complete Complete  PCP or Specialist appointment within 3-5 days of discharge  Complete Complete  HRI or Home Care Consult  Complete Complete  SW Recovery Care/Counseling Consult  Complete Complete  Palliative Care Screening  Complete Not Applicable  Skilled Nursing Facility  Patient Refused Complete

## 2022-08-27 NOTE — Progress Notes (Signed)
Physical Therapy Treatment Patient Details Name: [name] MRN: [id number] DOB: 07-18-1945 Today's Date: 08/27/2022   History of Present Illness Pt is a 77 y/o M admitted to [facility] from [facility] on 3/25 after a fall resulting in nondisplaced L 7th and 8th rib fractures, seen on chest CT. Imaging also revealed free air in abdomen. PMHx: CHF, A-fib, ESRD on peritoneal dialysis, HTN, HLD, CVA    PT Comments    Pt greeted supine, sleeping but easily woken, and agreeable to session with steady progress towards acute goals this session. Pt with increased alertness this session and overall needing less assist for all mobility. Pt able to come to sitting EOB with min guard for safety without need for physical assist. Pt able to power up to stand with RW support and min guard to steady and for safety. Pt able to demonstrate short gait in room with RW support and min A to steady as pt with flexed posture and some lateral postural away noted but no overt LOB. Pt agreeable to time up in chair at end of session. Plan to continue to progress functional transfers and gait next session within pt tolerance. Pt continues to benefit from skilled PT services to progress toward functional mobility goals.    Recommendations for follow up therapy are one component of a multi-disciplinary discharge planning process, led by the attending physician.  Recommendations may be updated based on patient status, additional functional criteria and insurance authorization.  Follow Up Recommendations  Can patient physically be transported by private vehicle: Yes    Assistance Recommended at Discharge Frequent or constant Supervision/Assistance  Patient can return home with the following A little help with walking and/or transfers;Assist for transportation;Help with stairs or ramp for entrance;A little help with bathing/dressing/bathroom   Equipment Recommendations  None recommended by PT    Recommendations for Other  Services       Precautions / Restrictions Precautions Precautions: Fall;Other (comment) Precaution Comments: L 7/8 rib fracture and maybe 12th, peritoneal dialysis; monitor O2 Restrictions Weight Bearing Restrictions: No     Mobility  Bed Mobility Overal bed mobility: Needs Assistance Bed Mobility: Supine to Sit     Supine to sit: Min guard     General bed mobility comments: no assist needed this date to come to sitting EOB    Transfers Overall transfer level: Needs assistance Equipment used: Rolling walker (2 wheels) Transfers: Sit to/from Stand, Bed to chair/wheelchair/BSC Sit to Stand: Min assist           General transfer comment: min A to power up to stand, light asssit to steady on rise, able to maintain statically for peri-care    Ambulation/Gait Ambulation/Gait assistance: Min assist Gait Distance (Feet): 15 Feet Assistive device: Rolling walker (2 wheels) Gait Pattern/deviations: Step-through pattern, Decreased stride length, Narrow base of support Gait velocity: decreased     General Gait Details: able to ambulate around bed with RW support to chair, min A for line/lead management   Stairs             Wheelchair Mobility    Modified Rankin (Stroke Patients Only)       Balance Overall balance assessment: Needs assistance Sitting-balance support: Feet supported, No upper extremity supported Sitting balance-Leahy Scale: Fair Sitting balance - Comments: able to maintain sitting balance on EOB with supervision for safety   Standing balance support: During functional activity, Reliant on assistive device for balance, Bilateral upper extremity supported Standing balance-Leahy Scale: Poor Standing balance comment:  reliant on external support for balance                            Cognition Arousal/Alertness: Awake/alert Behavior During Therapy: Flat affect Overall Cognitive Status: No family/caregiver present to determine  baseline cognitive functioning                                 General Comments: slow processing, difficult to understand        Exercises      General Comments General comments (skin integrity, edema, etc.): VSS on supplemetnal O2      Pertinent Vitals/Pain Pain Assessment Pain Assessment: Faces Faces Pain Scale: Hurts a little bit Pain Location: abdoment Pain Descriptors / Indicators: Tightness Pain Intervention(s): Monitored during session, Limited activity within patient's tolerance    Home Living                          Prior Function            PT Goals (current goals can now be found in the care plan section) Acute Rehab PT Goals Patient Stated Goal: none stated PT Goal Formulation: With patient Time For Goal Achievement: 09/01/22 Progress towards PT goals: Progressing toward goals    Frequency    Min 2X/week      PT Plan Discharge plan needs to be updated    Co-evaluation              AM-PAC PT "6 Clicks" Mobility   Outcome Measure  Help needed turning from your back to your side while in a flat bed without using bedrails?: A Little Help needed moving from lying on your back to sitting on the side of a flat bed without using bedrails?: A Lot Help needed moving to and from a bed to a chair (including a wheelchair)?: A Lot Help needed standing up from a chair using your arms (e.g., wheelchair or bedside chair)?: A Little Help needed to walk in hospital room?: A Little Help needed climbing 3-5 steps with a railing? : Total 6 Click Score: 14    End of Session Equipment Utilized During Treatment: Gait belt;Oxygen Activity Tolerance: Patient tolerated treatment well Patient left: with call bell/phone within reach;in chair;with chair alarm set Nurse Communication: Mobility status PT Visit Diagnosis: Unsteadiness on feet (R26.81);History of falling (Z91.81);Difficulty in walking, not elsewhere classified (R26.2)      Time: 0626-9485 PT Time Calculation (min) (ACUTE ONLY): 25 min  Charges:  $Gait Training: 8-22 mins $Therapeutic Activity: 8-22 mins                     Briona Korpela R. PTA Acute Rehabilitation Services Office: [phone]    Catalina Antigua 08/27/2022, 4:13 PM

## 2022-08-27 NOTE — Progress Notes (Signed)
Speech Language Pathology Treatment: Dysphagia  Patient Details Name: [name] MRN: [id number] DOB: 01-Feb-1946 Today's Date: 08/27/2022 Time: 1219-7588 SLP Time Calculation (min) (ACUTE ONLY): 11 min  Assessment / Plan / Recommendation Clinical Impression  Pt was seen for dysphagia treatment. He was alert and cooperative during the session. He asked questions regarding the current day and date and expressed that there was a day when he was so confused that he did not know where he was. Pt tolerated individual ice chips, thin water via spoon, and half-spoon boluses of puree without overt s/s of aspiration. Throat clearing and coughing were noted with larger boluses of puree and thin liquids. Multiple swallows were noted across consistencies, suggesting possible pharyngeal residue. Pt's swallow function appears to be improving and pt was encouraged to continue consuming ice chips after oral care. Pt verbalized understanding and agreement. SLP will continue to follow pt.     HPI HPI: Pt is a 77 y.o. male who presented after a fall. Pt with nondisplaced L 7th and 8th rib fractures. GI consulted due to coffee-ground emesis, and nausea. Hematemesis thought to be due to Mallory-Weiss tear which resolved and EGD thought not be needed and GI signed off 3/31. SLP consulted 3/31 due to RN's report of pt having difficulty initiating the swallow and coughing thereafter. Pt was on full liquids since 3/29, but has been cleared for advancement by Dr. Windell Norfolk. PMH: CHF, A-fib, ESRD on peritoneal dialysis, hypertension, hyperlipidemia, CVA.      SLP Plan  Continue with current plan of care      Recommendations for follow up therapy are one component of a multi-disciplinary discharge planning process, led by the attending physician.  Recommendations may be updated based on patient status, additional functional criteria and insurance authorization.    Recommendations  Diet recommendations: NPO (ice chips  allowed after oral care) Medication Administration: Via alternative means Postural Changes and/or Swallow Maneuvers: Seated upright 90 degrees                  Oral care QID;Oral care prior to ice chip/H20   Intermittent Supervision/Assistance Dysphagia, oropharyngeal phase (R13.12)     Continue with current plan of care    Murrel Freet I. Vear Clock, MS, CCC-SLP Acute Rehabilitation Services Office number [phone]  Scheryl Marten  08/27/2022, 10:41 AM

## 2022-08-27 NOTE — Plan of Care (Signed)

## 2022-08-27 NOTE — Progress Notes (Signed)
PROGRESS NOTE        PATIENT DETAILS Name: [name] Age: 77 y.o. Sex: male Date of Birth: [birth year] Admit Date: Sep 16, 2022 Admitting Physician Alan Mulder, MD JGG:[id number], Mar Daring, MD  Brief Summary: Patient is a 77 y.o.  male with history of ESRD on peritoneal dialysis, A-fib on Eliquis, HTN, HLD-who was transferred from [facility] for concern for a flail chest following a fall with rib fracture.  Hospital course complicated by intractable vomiting-leading to upper GI bleeding from presumed Mallory-Weiss tear, aspiration pneumonia, severe debility/deconditioning.  See below for further details.  Significant events: 3/25-3/26>> hospitalization at [facility] following a fall-concern for flail chest-transferred to [facility] at [facility] for trauma evaluation. 3/27>> abdominal pain following PD vol fill. Numerous episodes of vomiting evening-into night-vomitus became coffee-ground-some intermittent hypoxia. NPO-PPI infusion started.Renal MD decreasing PD vol fill. 3/31>> Per GI-no EGD-okay to restart anticoagulation.  IV heparin started.  SLP eval-keep n.p.o. 4/01>>Cortrak tube inserted 4/03>> sinus pauses on telemetry-bisoprolol/transdermal clonidine discontinued.  Significant studies: 3/25>> CT chest: Nondisplaced left seventh/eighth rib fracture, possible 12th left rib fracture. 3/25>> CT abdomen/pelvis: Extensive pneumoperitoneum-in the setting of PD catheter.  6.4 cm left cystic lesion-suggestive of a hemorrhagic cyst.  Significant microbiology data: 3/28>> blood cultures: No growth  Procedures: None  Consults: Nephrology Trauma surgery GI Palliative  Subjective: Much more awake and alert compared to the past several days.  On just 2 L of oxygen.  Tolerated first HD session well yesterday.  Objective: Vitals: Blood pressure (!) 148/55, pulse 63, temperature 98 F (36.7 C), temperature source Oral, resp. rate 18, height 5\' 6"  (1.676 m), weight 66.5 kg, SpO2 97  %.   Exam: Gen Exam:Alert awake-not in any distress HEENT:atraumatic, normocephalic Chest: B/L clear to auscultation anteriorly CVS:S1S2 regular Abdomen:soft non tender, non distended Extremities:no edema Neurology: Non focal Skin: no rash  Pertinent Labs/Radiology:    Latest Ref Rng & Units 08/27/2022    8:25 AM 08/25/2022    9:53 AM 08/24/2022    9:52 AM  CBC  WBC 4.0 - 10.5 K/uL 14.7  11.1  10.7   Hemoglobin 13.0 - 17.0 g/dL 8.9  8.6  9.5   Hematocrit 39.0 - 52.0 % 27.8  26.1  28.4   Platelets 150 - 400 K/uL 160  177  200     Lab Results  Component Value Date   NA 135 08/27/2022   K 4.2 08/27/2022   CL 99 08/27/2022   CO2 27 08/27/2022     Assessment/Plan: Mechanical fall with left seventh, eighth and possible 12th rib fracture Overall much better-significantly less pain Continue multimodal pain regimen Pulmonary tolerating Mobilization with PT/OT Trauma team has signed off.    Intractable nausea/vomiting Upper GI bleed with coffee-ground emesis-?  Mallory-Weiss tear Nausea/vomiting/coffee-ground emesis resolved.  This was felt to be due to narcotics/excessive PD fill volume.  Narcotics were minimized-PD volume was adjusted by nephrology. He stabilized with supportive care-no significant drop in hemoglobin.  GI was consulted-however on 3/31-GI signed off-no role for endoscopy as bleeding stopped Tolerating resumption of anticoagulation without any issues.  Acute hypoxic respiratory failure Multifactorial-initially due to rib fractures/splinting-then developed aspiration pneumonia-likely due to pulmonary edema in the setting of ineffective peritoneal dialysis. Stable on just 2 L of oxygen. Continue to treat underlying etiologies is much as possible Encourage pulmonary tolerating.  Aspiration pneumonia Much improved Cultures negative so  far IV Unasyn x 5 days total completed 4/1 Slight leukocytosis today-check CXR again tomorrow morning.  Watch closely-remains at  risk for recurrence of aspiration PNA given overall frailty.  Oropharyngeal dysphagia Unfortunately has developed severe deconditioning-causing oropharyngeal dysphagia-therapy recommended n.p.o. status  Cortrak tube placed on 4/1-hopefully we can get him optimized over the next few days and attempt to rechallenge with oral intake over the next several days.  SLP following.  Left renal hemorrhagic cyst Incidental finding on CT abdomen Discussed with renal MD-no reason to continue holding Eliquis for this issue-as hemoglobin stable.  ESRD on peritoneal dialysis Hyperkalemia Switch to HD as SNF discharge plan-first HD on 4/4-tolerated well Nephrology following.    Acute urinary retention  Occurred on 3/28 when patient had vomiting-extensive pain issues Foley catheter removed on 3/31  Continue to watch for urinary retention.  PAF Sinus rhythm on telemetry Continue amiodarone Eliquis initially on hold-IV heparin started 3/31-without any signs of rebleeding-switch to Eliquis 4/2.    Sinus pauses Occurred overnight on 4/3 Asymptomatic-he was sleeping Beta-blocker/transdermal clonidine discontinued-telemetry can use to be stable overnight-no  pauses overnight.  Acute on chronic chronic HFrEF See above Volume removal with dialysis . CAD (LHC 06/30/2022-two-vessel disease-70-80% mid LAD stenosis, chronic total occlusion of RCA with left-right collaterals-optimization of medical therapy recommended) Not on antiplatelets as he is on Eliquis Intolerant to statin Currently without any anginal symptoms-his left-sided musculoskeletal chest pain is from rib fractures.  HTN BP reasonable on amlodipine/losartan/doxazosin Bisoprolol/transdermal clonidine stopped on 4/3 due to sinus pauses Follow.  Debility/deconditioning Likely due to acute illness-has had significant decline in functional status over the past several days PT/OT-now recommending SNF Multiple issues/setbacks during this  hospitalization-now with severe failure to thrive-will ask palliative care to evaluate-remains a full code as of now.  Thankfully seems to have overall stabilized-hopefully HD has begun-he will slowly stabilize over the next several days.  If he fails to improve-we can contemplate another goals of care discussion over the next several days.  Nutrition Status: Nutrition Problem: Moderate Malnutrition Etiology: chronic illness Signs/Symptoms: severe muscle depletion, mild fat depletion Interventions: Tube feeding, MVI    BMI: Estimated body mass index is 23.66 kg/m as calculated from the following:   Height as of this encounter: 5\' 6"  (1.676 m).   Weight as of this encounter: 66.5 kg.   Code status:   Code Status: Full Code   DVT Prophylaxis: Place TED hose Start: 08/14/2022 2148 apixaban (ELIQUIS) tablet 5 mg    Family Communication: Shamell Lavallee [phone] 4/4   Disposition Plan: Status is: Inpatient Remains inpatient appropriate because: Severity of illness   Planned Discharge Destination:SNF   Diet: Diet Order             Diet NPO time specified  Diet effective now                     Antimicrobial agents: Anti-infectives (From admission, onward)    Start     Dose/Rate Route Frequency Ordered Stop   08/24/22 1505  ceFAZolin (ANCEF) IVPB 2g/100 mL premix        over 30 Minutes Intravenous Continuous PRN 08/24/22 1538 08/24/22 1505   08/24/22 1315  ceFAZolin (ANCEF) IVPB 2g/100 mL premix        2 g 200 mL/hr over 30 Minutes Intravenous To Radiology 08/24/22 1225 08/25/22 1315   08/19/22 1100  Ampicillin-Sulbactam (UNASYN) 3 g in sodium chloride 0.9 % 100 mL IVPB  3 g 200 mL/hr over 30 Minutes Intravenous Every 24 hours 08/19/22 0957 08/23/22 1141        MEDICATIONS: Scheduled Meds:  amiodarone  200 mg Oral Daily   amLODipine  10 mg Oral Daily   apixaban  5 mg Oral BID   Chlorhexidine Gluconate Cloth  6 each Topical Daily    Chlorhexidine Gluconate Cloth  6 each Topical Q0600   Chlorhexidine Gluconate Cloth  6 each Topical Q0600   doxazosin  8 mg Oral Daily   feeding supplement (PROSource TF20)  60 mL Per Tube Daily   fentaNYL (SUBLIMAZE) injection  25 mcg Intravenous Once   ferric citrate  420 mg Oral TID WC   free water  100 mL Per Tube TID   gentamicin cream  1 Application Topical Daily   insulin aspart  0-6 Units Subcutaneous Q4H   lidocaine  1 patch Transdermal QHS   losartan  100 mg Oral Daily   multivitamin  1 tablet Per Tube QHS   nicotine  21 mg Transdermal Daily   pantoprazole  40 mg Intravenous Q12H   polyethylene glycol  17 g Oral BID   sennosides  15 mL Oral BID   Continuous Infusions:  dialysis solution 4.25% low-MG/low-CA     feeding supplement (OSMOLITE 1.5 CAL) 50 mL/hr at 08/27/22 0710   promethazine (PHENERGAN) injection (IM or IVPB) Stopped (08/18/22 1826)   promethazine (PHENERGAN) injection (IM or IVPB)     PRN Meds:.acetaminophen, bisacodyl, fentaNYL (SUBLIMAZE) injection, hydrALAZINE, lactulose, metoCLOPramide (REGLAN) injection, naLOXone (NARCAN)  injection, ondansetron **OR** ondansetron (ZOFRAN) IV, oxyCODONE, promethazine **OR** promethazine (PHENERGAN) injection (IM or IVPB) **OR** promethazine, promethazine (PHENERGAN) injection (IM or IVPB)   I have personally reviewed following labs and imaging studies  LABORATORY DATA: CBC: Recent Labs  Lab 08/22/22 0825 08/23/22 0750 08/24/22 0952 08/25/22 0953 08/27/22 0825  WBC 11.5* 12.6* 10.7* 11.1* 14.7*  NEUTROABS  --   --  8.3*  --   --   HGB 9.3* 10.0* 9.5* 8.6* 8.9*  HCT 28.1* 30.4* 28.4* 26.1* 27.8*  MCV 87.3 87.9 86.6 87.3 89.7  PLT 204 225 200 177 160     Basic Metabolic Panel: Recent Labs  Lab 08/23/22 0750 08/23/22 2145 08/24/22 0952 08/24/22 1653 08/25/22 0954 08/26/22 0745 08/27/22 0825  NA 136  --  137  --  138 138 135  K 4.3  --  4.0  --  3.7 4.2 4.2  CL 101  --  101  --  99 97* 99  CO2 23  --   25  --  25 25 27   GLUCOSE 165*  --  109*  --  153* 194* 212*  BUN 75*  --  76*  --  77* 89* 50*  CREATININE 8.61*  --  8.05*  --  8.19* 8.53* 5.08*  CALCIUM 8.0*  --  7.9*  --  8.1* 8.3* 8.3*  MG  --  2.0 1.9 2.0 2.0  --   --   PHOS 6.9* 6.0* 5.4* 5.5* 5.1* 4.6 3.1     GFR: Estimated Creatinine Clearance: 11.2 mL/min (A) (by C-G formula based on SCr of 5.08 mg/dL (H)).  Liver Function Tests: Recent Labs  Lab 08/23/22 0750 08/24/22 0952 08/25/22 0954 08/26/22 0745 08/27/22 0825  ALBUMIN 1.5* <1.5* <1.5* <1.5* <1.5*    No results for input(s): "LIPASE", "AMYLASE" in the last 168 hours. No results for input(s): "AMMONIA" in the last 168 hours.  Coagulation Profile: No results for input(s): "INR", "PROTIME"  in the last 168 hours.   Cardiac Enzymes: No results for input(s): "CKTOTAL", "CKMB", "CKMBINDEX", "TROPONINI" in the last 168 hours.  BNP (last 3 results) No results for input(s): "PROBNP" in the last 8760 hours.  Lipid Profile: No results for input(s): "CHOL", "HDL", "LDLCALC", "TRIG", "CHOLHDL", "LDLDIRECT" in the last 72 hours.  Thyroid Function Tests: No results for input(s): "TSH", "T4TOTAL", "FREET4", "T3FREE", "THYROIDAB" in the last 72 hours.  Anemia Panel: No results for input(s): "VITAMINB12", "FOLATE", "FERRITIN", "TIBC", "IRON", "RETICCTPCT" in the last 72 hours.  Urine analysis:    Component Value Date/Time   COLORURINE STRAW (A) 11/28/2020 1055   APPEARANCEUR CLEAR (A) 11/28/2020 1055   APPEARANCEUR Clear 01/03/2012 1117   LABSPEC 1.008 11/28/2020 1055   LABSPEC 1.004 01/03/2012 1117   PHURINE 7.0 11/28/2020 1055   GLUCOSEU 50 (A) 11/28/2020 1055   GLUCOSEU 100 (A) 01/21/2020 1223   HGBUR NEGATIVE 11/28/2020 1055   BILIRUBINUR NEGATIVE 11/28/2020 1055   BILIRUBINUR neg 07/30/2013 1127   BILIRUBINUR Negative 01/03/2012 1117   KETONESUR NEGATIVE 11/28/2020 1055   PROTEINUR 30 (A) 11/28/2020 1055   UROBILINOGEN 0.2 01/21/2020 1223   NITRITE  NEGATIVE 11/28/2020 1055   LEUKOCYTESUR NEGATIVE 11/28/2020 1055   LEUKOCYTESUR Negative 01/03/2012 1117    Sepsis Labs: Lactic Acid, Venous    Component Value Date/Time   LATICACIDVEN 0.8 08/19/2022 0929    MICROBIOLOGY: Recent Results (from the past 240 hour(s))  Culture, blood (Routine X 2) w Reflex to ID Panel     Status: None   Collection Time: 08/19/22  9:29 AM   Specimen: BLOOD LEFT HAND  Result Value Ref Range Status   Specimen Description BLOOD LEFT HAND  Final   Special Requests   Final    BOTTLES DRAWN AEROBIC AND ANAEROBIC Blood Culture adequate volume   Culture   Final    NO GROWTH 5 DAYS Performed at [facility], [address], [place], [place] [address]    Report Status 08/24/2022 FINAL  Final  Culture, blood (Routine X 2) w Reflex to ID Panel     Status: None   Collection Time: 08/19/22  9:34 AM   Specimen: BLOOD LEFT HAND  Result Value Ref Range Status   Specimen Description BLOOD LEFT HAND  Final   Special Requests   Final    BOTTLES DRAWN AEROBIC AND ANAEROBIC Blood Culture adequate volume   Culture   Final    NO GROWTH 5 DAYS Performed at [facility], [address] [place], [place] [address]    Report Status 08/24/2022 FINAL  Final    RADIOLOGY STUDIES/RESULTS: No results found.   LOS: 10 days   Jeoffrey Massed, MD  [name]    To contact the attending provider between 7A-7P or the covering provider during after hours 7P-7A, please log into the web site [email or web address] and access using universal [place] password for that web site. If you do not have the password, please call the hospital operator.  08/27/2022, 12:07 PM

## 2022-08-28 ENCOUNTER — Inpatient Hospital Stay (HOSPITAL_COMMUNITY): Payer: Medicare PPO

## 2022-08-28 DIAGNOSIS — D72829 Elevated white blood cell count, unspecified: Secondary | ICD-10-CM

## 2022-08-28 DIAGNOSIS — E46 Unspecified protein-calorie malnutrition: Secondary | ICD-10-CM

## 2022-08-28 DIAGNOSIS — J9691 Respiratory failure, unspecified with hypoxia: Secondary | ICD-10-CM

## 2022-08-28 DIAGNOSIS — E1129 Type 2 diabetes mellitus with other diabetic kidney complication: Secondary | ICD-10-CM

## 2022-08-28 DIAGNOSIS — R1312 Dysphagia, oropharyngeal phase: Secondary | ICD-10-CM

## 2022-08-28 DIAGNOSIS — I739 Peripheral vascular disease, unspecified: Secondary | ICD-10-CM

## 2022-08-28 DIAGNOSIS — K921 Melena: Secondary | ICD-10-CM

## 2022-08-28 LAB — CBC
HCT: 29 % — ABNORMAL LOW (ref 39.0–52.0)
HCT: 27.1 % — ABNORMAL LOW (ref 39.0–52.0)
HCT: 27.3 % — ABNORMAL LOW (ref 39.0–52.0)
HCT: 26.1 % — ABNORMAL LOW (ref 39.0–52.0)
Hemoglobin: 9.2 g/dL — ABNORMAL LOW (ref 13.0–17.0)
Hemoglobin: 8.8 g/dL — ABNORMAL LOW (ref 13.0–17.0)
Hemoglobin: 8.6 g/dL — ABNORMAL LOW (ref 13.0–17.0)
Hemoglobin: 8.4 g/dL — ABNORMAL LOW (ref 13.0–17.0)
MCH: 28.2 pg (ref 26.0–34.0)
MCH: 28.7 pg (ref 26.0–34.0)
MCH: 28.3 pg (ref 26.0–34.0)
MCH: 28.4 pg (ref 26.0–34.0)
MCHC: 31.7 g/dL (ref 30.0–36.0)
MCHC: 32.5 g/dL (ref 30.0–36.0)
MCHC: 31.5 g/dL (ref 30.0–36.0)
MCHC: 32.2 g/dL (ref 30.0–36.0)
MCV: 89 fL (ref 80.0–100.0)
MCV: 88.3 fL (ref 80.0–100.0)
MCV: 89.8 fL (ref 80.0–100.0)
MCV: 88.2 fL (ref 80.0–100.0)
Platelets: 167 10*3/uL (ref 150–400)
Platelets: 154 10*3/uL (ref 150–400)
Platelets: 151 10*3/uL (ref 150–400)
Platelets: 144 10*3/uL — ABNORMAL LOW (ref 150–400)
RBC: 3.26 MIL/uL — ABNORMAL LOW (ref 4.22–5.81)
RBC: 3.07 MIL/uL — ABNORMAL LOW (ref 4.22–5.81)
RBC: 3.04 MIL/uL — ABNORMAL LOW (ref 4.22–5.81)
RBC: 2.96 MIL/uL — ABNORMAL LOW (ref 4.22–5.81)
RDW: 15.5 % (ref 11.5–15.5)
RDW: 15.4 % (ref 11.5–15.5)
RDW: 15.4 % (ref 11.5–15.5)
RDW: 15.3 % (ref 11.5–15.5)
WBC: 17.7 10*3/uL — ABNORMAL HIGH (ref 4.0–10.5)
WBC: 16.8 10*3/uL — ABNORMAL HIGH (ref 4.0–10.5)
WBC: 18.2 10*3/uL — ABNORMAL HIGH (ref 4.0–10.5)
WBC: 17.8 10*3/uL — ABNORMAL HIGH (ref 4.0–10.5)
nRBC: 0 % (ref 0.0–0.2)
nRBC: 0 % (ref 0.0–0.2)
nRBC: 0.1 % (ref 0.0–0.2)
nRBC: 0 % (ref 0.0–0.2)

## 2022-08-28 LAB — RENAL FUNCTION PANEL
Albumin: <1.5 g/dL — ABNORMAL LOW (ref 3.5–5.0)
Albumin: <1.5 g/dL — ABNORMAL LOW (ref 3.5–5.0)
Anion gap: 9 (ref 5–15)
Anion gap: 13 (ref 5–15)
BUN: 50 mg/dL — ABNORMAL HIGH (ref 8–23)
BUN: 68 mg/dL — ABNORMAL HIGH (ref 8–23)
CO2: 27 mmol/L (ref 22–32)
CO2: 25 mmol/L (ref 22–32)
Calcium: 8.3 mg/dL — ABNORMAL LOW (ref 8.9–10.3)
Calcium: 8.5 mg/dL — ABNORMAL LOW (ref 8.9–10.3)
Chloride: 99 mmol/L (ref 98–111)
Chloride: 97 mmol/L — ABNORMAL LOW (ref 98–111)
Creatinine, Ser: 5.08 mg/dL — ABNORMAL HIGH (ref 0.61–1.24)
Creatinine, Ser: 5.9 mg/dL — ABNORMAL HIGH (ref 0.61–1.24)
GFR, Estimated: 11 mL/min — ABNORMAL LOW (ref >60)
GFR, Estimated: 9 mL/min — ABNORMAL LOW (ref >60)
Glucose, Bld: 212 mg/dL — ABNORMAL HIGH (ref 70–99)
Glucose, Bld: 193 mg/dL — ABNORMAL HIGH (ref 70–99)
Phosphorus: 3.1 mg/dL (ref 2.5–4.6)
Phosphorus: 3.2 mg/dL (ref 2.5–4.6)
Potassium: 4.2 mmol/L (ref 3.5–5.1)
Potassium: 4.5 mmol/L (ref 3.5–5.1)
Sodium: 135 mmol/L (ref 135–145)
Sodium: 135 mmol/L (ref 135–145)

## 2022-08-28 LAB — GLUCOSE, CAPILLARY
Glucose-Capillary: 194 mg/dL — ABNORMAL HIGH (ref 70–99)
Glucose-Capillary: 254 mg/dL — ABNORMAL HIGH (ref 70–99)
Glucose-Capillary: 223 mg/dL — ABNORMAL HIGH (ref 70–99)
Glucose-Capillary: 162 mg/dL — ABNORMAL HIGH (ref 70–99)

## 2022-08-28 LAB — TYPE AND SCREEN
ABO/RH(D): O POS
Antibody Screen: NEGATIVE

## 2022-08-28 MED ORDER — ANTICOAGULANT SODIUM CITRATE 4% (200MG/5ML) IV SOLN: 5.0000 mL | Status: DC | PRN

## 2022-08-28 MED ORDER — SODIUM CHLORIDE 0.9 % IV SOLN: INTRAVENOUS | Status: DC

## 2022-08-28 MED ORDER — HEPARIN SODIUM (PORCINE) 1000 UNIT/ML DIALYSIS
1000.0000 [IU] | INTRAMUSCULAR | Status: DC | PRN
  Administered 2022-08-28: 1000 [IU]

## 2022-08-28 MED ORDER — ALTEPLASE 2 MG IJ SOLR: 2.0000 mg | Freq: Once | INTRAMUSCULAR | Status: DC | PRN

## 2022-08-28 NOTE — Progress Notes (Signed)
Patient ID: [name], male   DOB: 1945-11-24, 77 y.o.   MRN: [id number] [facility] Progress Note   Assessment/ Plan:   1.  Mechanical fall with fracture of the left seventh/eighth and possibly 12th ribs: Originally transferred for suspicion of flail chest however this was ruled out by trauma surgery.  Ongoing efforts at symptom management. 2. ESRD: Continue peritoneal dialysis with cautious increase of ultrafiltration for volume to 1.8 L. Seen on PD and currently still hooked up, waiting for full drain. He's resting comfortably with no complaints (appears more comfortable than yesterday). 5sec pause overnight 4/2 and Clonidine and bblr held.  Appreciate VIR placing the RIJ TC on 4/2 -> tolerated iHD #1 on 4/4. Next HD on Sat; he did very well on HD with 2L net UF. Continues to breath much more comfortably.   Will plan on flushing the PD Mon.  Renal navigator aware of pt and will assist w/ placement as he will be headed to a SNF. Will hopefully be able to get a weekly flush at his HD center while in a SNF (will try to set that up).  3. Anemia: Currently with stable hemoglobin/hematocrit after having coffee-ground emesis last week, concerns raised regarding Mallory-Weiss tear versus upper GI bleed with no immediate plans for EGD. 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  Switched from sevelamer to Auryxia (phos now 5.5 on 4/3). 5. Nutrition: Continue renal diet with fluid restriction/phosphorus restriction. 6.  Acute hypoxic respiratory failure: Suspected to be likely secondary to aspiration pneumonia in the setting of recurrent emesis/intolerance to PD fill volume.  Started on intravenous Unasyn with improvement of leukocytosis but continued dependency on oxygen supplementation/hypoxia noted.    Subjective:   Tolerated HD #1 Thur; breathing much more comfortable.    Objective:   BP (!) 147/50 (BP Location:  Right Arm)   Pulse 70   Temp 98.3 F (36.8 C) (Oral)   Resp 19   Ht 5\' 6"  (1.676 m)   Wt 66.4 kg   SpO2 98%   BMI 23.63 kg/m   Physical Exam: Gen: Fatigued resting in bed; not tachypneic. On oxygen via nasal cannula CVS: Pulse regular rhythm, normal rate, S1 and S2 normal Resp: Poor inspiratory effort with decreased breath sounds over bases, no rales/rhonchi Abd: Soft, slightly distended, bowel sounds normal Ext: No lower extremity edema Access: RIJ TC  Labs: BMET Recent Labs  Lab 08/22/22 0825 08/23/22 0750 08/23/22 2145 08/24/22 0952 08/24/22 1653 08/25/22 0954 08/26/22 0745 08/27/22 0825 08/28/22 0317  NA 135 136  --  137  --  138 138 135 135  K 5.2* 4.3  --  4.0  --  3.7 4.2 4.2 4.5  CL 100 101  --  101  --  99 97* 99 97*  CO2 21* 23  --  25  --  25 25 27 25   GLUCOSE 92 165*  --  109*  --  153* 194* 212* 193*  BUN 75* 75*  --  76*  --  77* 89* 50* 68*  CREATININE 8.73* 8.61*  --  8.05*  --  8.19* 8.53* 5.08* 5.90*  CALCIUM 7.9* 8.0*  --  7.9*  --  8.1* 8.3* 8.3* 8.5*  PHOS 6.9* 6.9* 6.0* 5.4* 5.5* 5.1* 4.6 3.1 3.2   CBC Recent Labs  Lab 08/24/22 0952 08/25/22 0953 08/27/22 0825 08/28/22 0317  WBC 10.7* 11.1* 14.7* 17.7*  NEUTROABS 8.3*  --   --   --  HGB 9.5* 8.6* 8.9* 9.2*  HCT 28.4* 26.1* 27.8* 29.0*  MCV 86.6 87.3 89.7 89.0  PLT 200 177 160 167      Medications:     amiodarone  200 mg Oral Daily   amLODipine  10 mg Oral Daily   apixaban  5 mg Oral BID   Chlorhexidine Gluconate Cloth  6 each Topical Daily   Chlorhexidine Gluconate Cloth  6 each Topical Q0600   Chlorhexidine Gluconate Cloth  6 each Topical Q0600   doxazosin  8 mg Oral Daily   feeding supplement (PROSource TF20)  60 mL Per Tube Daily   fentaNYL (SUBLIMAZE) injection  25 mcg Intravenous Once   ferric citrate  420 mg Oral TID WC   free water  100 mL Per Tube TID   gentamicin cream  1 Application Topical Daily   insulin aspart  0-6 Units Subcutaneous Q4H   lidocaine  1 patch  Transdermal QHS   losartan  100 mg Oral Daily   multivitamin  1 tablet Per Tube QHS   nicotine  21 mg Transdermal Daily   pantoprazole  40 mg Intravenous Q12H   polyethylene glycol  17 g Oral BID   sennosides  15 mL Oral BID

## 2022-08-28 NOTE — Progress Notes (Addendum)
Received patient in bed.Awake ,alert and oriented x 4 .  Access used:Right upper arm AVF that worked well.  Medicine given : None.  Duration of treatment 3.75 hours.  Fluid removed : 1,800 cc.  Hemo issue/comment: Unable to meet prescribed fluid goal of 2-2.5 liters due to episodes of hypotension especially during the last 45 minutes of his treatment.               Patient had a large volume of black tarry liquid stool near the end of the treatment possibly within last 20 minutes of his treatment,maybe 300-400 cc.Hd nurses cleaned the patient,made a new bedding for him.  Hand off to the patient's nurse.Patient's nurse made aware of the above black tarry liquid stool.

## 2022-08-28 NOTE — Plan of Care (Signed)

## 2022-08-28 NOTE — Progress Notes (Addendum)
[facility] - Reconsult note    Referring Provider: TRH/ Ghimire Primary Care Physician:  Sherlene Shams, MD Primary Gastroenterologist:  Gentry Fitz   HPI: [name] is a 77 y.o. male with a past medical history of hypertension, cardiomyopathy with a EF of 30 to 35%, atrial fibrillation/flutter on Eliquis, CVA/TIA peripheral arterial disease, diabetes mellitus type 2, renal cell cancer status post left nephrectomy ESRD on peritoneal dialysis. He was admitted to the hospital on 09-15-2022 in transfer from [facility] for trauma involvement after he had sustained a fall and suffered several rib fractures. There was concern for possible flail chest. Imaging has shown a displaced left seventh and eighth rib fracture and possible left 12th rib fracture. He was started on Unasyn for suspected aspiration pneumonia and required supplemental oxygen. During his admission he developed nausea vomiting with coffee-ground emesis on 08/18/2022. He was initially evaluated by our GI service 08/19/2022 and he was placed on PPI with recommendations for possible EGD after his respiratory status stabilized. Our service signed off on 08/22/2022 as he did not demonstrate any further active GI bleeding hematemesis. A repeat GI consult was requested today after he passed a melenic stool during dialysis earlier today.  He denies having any nausea or vomiting.  He has intermittent lower abdominal pain since his admission.  No significant abdominal pain at this time.  He is currently sitting on the bedpan which contains a small volume of black melenic stool.  He has developed severe deconditioning oropharyngeal dysphagia therefore he was placed n.p.o. and a core track tube was placed 08/23/2022 and tube feedings were initiated.  Objective:  Vital signs in last 24 hours: Temp:  [97.6 F (36.4 C)-98.3 F (36.8 C)] 97.8 F (36.6 C) (04/06 1302) Pulse Rate:  [43-118] 117 (04/06 1302) Resp:  [16-30] 19  (04/06 1302) BP: (97-167)/(2-82) 107/57 (04/06 1302) SpO2:  [94 %-100 %] 99 % (04/06 1302) Weight:  [62.9 kg-66.4 kg] 62.9 kg (04/06 1302) Last BM Date : 08/25/22 General: Alert chronically ill-appearing 77 year old male. Heart: Regular rate and rhythm, no murmurs. Pulm: Breath sounds diminished throughout.  Oxygen 2 L nasal cannula. Chest: Right subclavian dialysis catheter intact. Abdomen: Protuberant.  Nontender.  Left abdominal peritoneal dialysis catheter intact intact.  Cortrak tube with feedings transfusing. Rectum: Black melenic stool in bedpan, bedside heme slide grossly heme positive. Extremities: No edema. Neurologic:  Alert and  oriented x 4.  Speech is softly spoken.  He moves all extremities. Psych:  Alert and cooperative. Normal mood and affect.  Intake/Output from previous day: 04/05 0701 - 04/06 0700 In: 1180.8 [NG/GT:1180.8] Out: -  Intake/Output this shift: Total I/O In: -  Out: 2200 [Urine:400; Other:1800]  Lab Results: Recent Labs    08/27/22 0825 08/28/22 0317  WBC 14.7* 17.7*  HGB 8.9* 9.2*  HCT 27.8* 29.0*  PLT 160 167   BMET Recent Labs    08/26/22 0745 08/27/22 0825 08/28/22 0317  NA 138 135 135  K 4.2 4.2 4.5  CL 97* 99 97*  CO2 25 27 25   GLUCOSE 194* 212* 193*  BUN 89* 50* 68*  CREATININE 8.53* 5.08* 5.90*  CALCIUM 8.3* 8.3* 8.5*   LFT Recent Labs    08/28/22 0317  ALBUMIN <1.5*   PT/INR No results for input(s): "LABPROT", "INR" in the last 72 hours. Hepatitis Panel No results for input(s): "HEPBSAG", "HCVAB", "HEPAIGM", "HEPBIGM" in the last 72 hours.  DG Chest Port 1 View  Result Date: 08/28/2022 CLINICAL DATA:  77 year old male with shortness of breath. EXAM: PORTABLE CHEST 1 VIEW COMPARISON:  Portable chest 08/25/2022 and earlier. FINDINGS: Portable AP semi upright view at 0716 hours. Calcified aortic atherosclerosis. Stable cardiomegaly and mediastinal contours. Stable right chest dual lumen dialysis type catheter. Enteric  feeding tube remains in place, courses to the abdomen with tip not included. Evidence of ongoing small right pleural effusion. Small left pleural effusion may have regressed and left lung base ventilation appears mildly improved. Stable probably chronic increased pulmonary interstitial markings otherwise. No pneumothorax, consolidation, definite pulmonary edema. Partially visible gas fills stomach. No acute osseous abnormality identified. IMPRESSION: 1. Stable lines and tubes. 2. Probable small pleural effusion(s). Improved left lung base ventilation. No new cardiopulmonary abnormality. Electronically Signed   By: Odessa Fleming M.D.   On: 08/28/2022 10:47    Assessment / Plan:  77 year old white male admitted from [facility]  to [facility] 08/08/2022 after falling and sustaining left rib fractures and with concern for flail chest due to displaced left seventh and eighth rib fractures   Acute nausea and vomiting 3/27 -08/19/2022, multiple episodes of ground emesis without recurrence.  Suspected retch gastropathy versus acute esophagitis versus Mallory-Weiss tear. EGD initially deferred until pulmonary status stabilized.  No further hematemesis, however, he developed melenic stools earlier today. -EGD benefits and risks discussed including risk with sedation, risk of bleeding, perforation and infection  -Continue Pantoprazole 40 mg IV twice daily -Zofran 4 mg p.o. or IV every 6 hours as needed -Check H&H every 6 hours x 24 hours -Transfuse for hemoglobin < 8  Chronic anemia.  Hemoglobin 8.9 -> 9.2.  Acute hypoxic respiratory failure.  Acute aspiration episode, chest x-ray showing right basilar consolidation and collapse moderate right effusion, suspect aspiration pneumonia.  Requiring nasal oxygen/2 L.    ESRD on peritoneal dialysis at home. On hemodialysis during hospitalization. CT from 08/16/2022 had shown increased volume of free intraperitoneal air more than expected.   Leukocytosis with WBC count of  17.7.   History of atrial fibs flutter.  Last dose of Eliquis was 08/27/2022 at 11:30 PM. -Continue to hold Eliquis  History of prior CVA/TIA  Peripheral arterial disease  Diabetes mellitus  Status post renal cell CA left nephrectomy  CAD.  Cardiomyopathy with EF 30 to 35%     Principal Problem:   Flail chest Active Problems:   Anemia   Coffee ground emesis   Anticoagulated   Aspiration pneumonia   Malnutrition of moderate degree     LOS: 11 days   Arnaldo Natal  08/28/2022, 3:27PM

## 2022-08-28 NOTE — Progress Notes (Signed)
Post HD tx Note  08/28/22 1302  Vitals  Temp 97.8 F (36.6 C)  Temp Source Oral  BP (!) 107/57  MAP (mmHg) 72  BP Location Right Arm  BP Method Automatic  Patient Position (if appropriate) Lying  Pulse Rate (!) 117  Pulse Rate Source Monitor  ECG Heart Rate (!) 112  Resp 19  Oxygen Therapy  SpO2 99 %  O2 Device Nasal Cannula  O2 Flow Rate (L/min) 2 L/min  Pulse Oximetry Type Continuous  During Treatment Monitoring  Intra-Hemodialysis Comments (S)   (post HD tx VS check)  Post Treatment  Dialyzer Clearance Lightly streaked  Duration of HD Treatment -hour(s) 3.75 hour(s)  Hemodialysis Intake (mL) 0 mL  Liters Processed 90  Fluid Removed (mL) 1800 mL  Tolerated HD Treatment (S)  No (Comment) (bp soft last 45 min of tx per HD nurse running tx)  Post-Hemodialysis Comments (S)  tx completed w/ soft bp issues last 45 min of tx per HD nurse running HD tx. UF goal not met, blood rinsed back. Medication Admin: Heparin Dwells 3800units  Hemodialysis Catheter Right Internal jugular Double lumen Permanent (Tunneled)  Placement Date/Time: 08/24/22 1513   Serial / Lot #: [id number]  Expiration Date: 06/09/26  Time Out: Correct patient;Correct site;Correct procedure  Maximum sterile barrier precautions: Hand hygiene;Cap;Mask;Sterile gown;Sterile gloves;Large sterile ...  Site Condition No complications  Blue Lumen Status Heparin locked;Dead end cap in place  Red Lumen Status Heparin locked;Dead end cap in place  Purple Lumen Status N/A  Catheter fill solution Heparin 1000 units/ml  Catheter fill volume (Arterial) 1.9 cc  Catheter fill volume (Venous) 1.9  Dressing Type Transparent  Dressing Status Antimicrobial disc in place;Clean, Dry, Intact  Drainage Description None  Dressing Change Due 09/04/22  Post treatment catheter status Capped and Clamped

## 2022-08-28 NOTE — Progress Notes (Addendum)
PROGRESS NOTE        PATIENT DETAILS Name: [name] Age: 77 y.o. Sex: male Date of Birth: [birth year] Admit Date: Sep 13, 2022 Admitting Physician Alan Mulder, MD PVX:[id number], Mar Daring, MD  Brief Summary: Patient is a 77 y.o.  male with history of ESRD on peritoneal dialysis, A-fib on Eliquis, HTN, HLD-who was transferred from [facility] for concern for a flail chest following a fall with rib fracture.  Hospital course complicated by intractable vomiting-leading to upper GI bleeding from presumed Mallory-Weiss tear, aspiration pneumonia, severe debility/deconditioning.  See below for further details.  Significant events: 3/25-3/26>> hospitalization at [facility] following a fall-concern for flail chest-transferred to [facility] for trauma evaluation. 3/27>> abdominal pain following PD vol fill. Numerous episodes of vomiting evening-into night-vomitus became coffee-ground-some intermittent hypoxia. NPO-PPI infusion started.Renal MD decreasing PD vol fill. 3/31>> Per GI-no EGD-okay to restart anticoagulation.  IV heparin started.  SLP eval-keep n.p.o. 4/01>>Cortrak tube inserted 4/03>> sinus pauses on telemetry-bisoprolol/transdermal clonidine discontinued.  Significant studies: 3/25>> CT chest: Nondisplaced left seventh/eighth rib fracture, possible 12th left rib fracture. 3/25>> CT abdomen/pelvis: Extensive pneumoperitoneum-in the setting of PD catheter.  6.4 cm left cystic lesion-suggestive of a hemorrhagic cyst.  Significant microbiology data: 3/28>> blood cultures: No growth  Procedures: None  Consults: Nephrology Trauma surgery GI Palliative  Subjective: No major issues overnight-unchanged.  Afebrile.  Pain in the left chest is much better over the past several days.  Stable on 1-2 L of oxygen.  Objective: Vitals: Blood pressure 102/68, pulse (!) 113, temperature 97.6 F (36.4 C), resp. rate 16, height 5\' 6"  (1.676 m), weight 64.7 kg, SpO2 97 %.    Exam: Gen Exam:Alert awake-not in any distress HEENT:atraumatic, normocephalic Chest: B/L clear to auscultation anteriorly CVS:S1S2 regular Abdomen:soft non tender, non distended Extremities:no edema Neurology: Non focal Skin: no rash  Pertinent Labs/Radiology:    Latest Ref Rng & Units 08/28/2022    3:17 AM 08/27/2022    8:25 AM 08/25/2022    9:53 AM  CBC  WBC 4.0 - 10.5 K/uL 17.7  14.7  11.1   Hemoglobin 13.0 - 17.0 g/dL 9.2  8.9  8.6   Hematocrit 39.0 - 52.0 % 29.0  27.8  26.1   Platelets 150 - 400 K/uL 167  160  177     Lab Results  Component Value Date   NA 135 08/28/2022   K 4.5 08/28/2022   CL 97 (L) 08/28/2022   CO2 25 08/28/2022     Assessment/Plan: Mechanical fall with left seventh, eighth and possible 12th rib fracture Overall much better-significantly less pain Continue multimodal pain regimen Pulmonary tolerating Mobilization with PT/OT Trauma team has signed off.    Intractable nausea/vomiting Upper GI bleed with coffee-ground emesis-?  Mallory-Weiss tear Nausea/vomiting/coffee-ground emesis resolved.  Had approximately 10-15 episodes of vomiting 3/27 overnight This was felt to be due to narcotics/excessive PD fill volume.  Narcotics were minimized-PD volume was adjusted by nephrology. He stabilized with supportive care-no significant drop in hemoglobin.  GI was consulted-however on 3/31-GI signed off-no role for endoscopy as bleeding stopped Tolerating resumption of anticoagulation without any issues.  Addendum: Had black tarry stool towards the end of HD Stable Hold eliquis Already on PPI GI consulted  Acute hypoxic respiratory failure Multifactorial-initially due to rib fractures/splinting-then developed aspiration pneumonia-likely due to pulmonary edema in the setting of ineffective peritoneal dialysis.  Overall improved-requiring only minimal amount of oxygen-1-2 L.  Continue to treat underlying etiologies is much as possible Encourage  pulmonary tolerating.  Aspiration pneumonia Overall clinically improved-afebrile  Cultures negative so far IV Unasyn x 5 days total completed 4/1 Unfortunately leukocytosis is stable increasing-he looks clinically unchanged-plan is to repeat cultures today-check CT chest.  Due to overall stability-unclear source of infection-hold off on starting antibiotics unless he is febrile or becomes hemodynamically unstable.    Oropharyngeal dysphagia Unfortunately has developed severe deconditioning-causing oropharyngeal dysphagia-therapy recommended n.p.o. status  Cortrak tube placed on 4/1-hopefully we can get him optimized over the next few days and attempt to rechallenge with oral intake over the next several days.  SLP following.  Left renal hemorrhagic cyst Incidental finding on CT abdomen Discussed with renal MD-no reason to continue holding Eliquis for this issue-as hemoglobin stable.  ESRD on peritoneal dialysis Hyperkalemia Has been switched to HD as SNF discharge plan-unable to do PD at SNF First HD on 4/4-tolerated well Nephrology following.    Acute urinary retention  Occurred on 3/28 when patient had vomiting-extensive pain issues Foley catheter removed on 3/31  Continue to watch for urinary retention.  PAF Sinus rhythm on telemetry Continue amiodarone Eliquis initially on hold-IV heparin started 3/31-without any signs of rebleeding-switch to Eliquis 4/2.    Sinus pauses Occurred overnight on 4/3 Asymptomatic-he was sleeping Beta-blocker/transdermal clonidine discontinued-no further episodes on telemetry-watch closely.    Acute on chronic chronic HFrEF See above Volume removal with dialysis . CAD (LHC 06/30/2022-two-vessel disease-70-80% mid LAD stenosis, chronic total occlusion of RCA with left-right collaterals-optimization of medical therapy recommended) Not on antiplatelets as he is on Eliquis Intolerant to statin Currently without any anginal symptoms-his  left-sided musculoskeletal chest pain is from rib fractures.  HTN BP reasonable on amlodipine/losartan/doxazosin Bisoprolol/transdermal clonidine stopped on 4/3 due to sinus pauses Follow.  Debility/deconditioning Likely due to acute illness-has had significant decline in functional status over the past several days PT/OT-now recommending SNF Multiple issues/setbacks during this hospitalization-now with severe failure to thrive-will ask palliative care to evaluate-remains a full code as of now.  Thankfully seems to have overall stabilized-hopefully HD has begun-he will slowly stabilize over the next several days.  If he fails to improve-we can contemplate another goals of care discussion over the next several days.  Nutrition Status: Nutrition Problem: Moderate Malnutrition Etiology: chronic illness Signs/Symptoms: severe muscle depletion, mild fat depletion Interventions: Tube feeding, MVI    BMI: Estimated body mass index is 23.02 kg/m as calculated from the following:   Height as of this encounter: 5\' 6"  (1.676 m).   Weight as of this encounter: 64.7 kg.   Code status:   Code Status: Full Code   DVT Prophylaxis: Place TED hose Start: 08/12/2022 2148 apixaban (ELIQUIS) tablet 5 mg    Family Communication: Darreon Anspach [phone] 4/4   Disposition Plan: Status is: Inpatient Remains inpatient appropriate because: Severity of illness   Planned Discharge Destination:SNF   Diet: Diet Order             Diet NPO time specified  Diet effective now                     Antimicrobial agents: Anti-infectives (From admission, onward)    Start     Dose/Rate Route Frequency Ordered Stop   08/24/22 1505  ceFAZolin (ANCEF) IVPB 2g/100 mL premix        over 30 Minutes Intravenous Continuous PRN 08/24/22 1538 08/24/22 1505  08/24/22 1315  ceFAZolin (ANCEF) IVPB 2g/100 mL premix        2 g 200 mL/hr over 30 Minutes Intravenous To Radiology 08/24/22 1225  08/25/22 1315   08/19/22 1100  Ampicillin-Sulbactam (UNASYN) 3 g in sodium chloride 0.9 % 100 mL IVPB        3 g 200 mL/hr over 30 Minutes Intravenous Every 24 hours 08/19/22 0957 08/23/22 1141        MEDICATIONS: Scheduled Meds:  amiodarone  200 mg Oral Daily   amLODipine  10 mg Oral Daily   apixaban  5 mg Oral BID   Chlorhexidine Gluconate Cloth  6 each Topical Daily   Chlorhexidine Gluconate Cloth  6 each Topical Q0600   Chlorhexidine Gluconate Cloth  6 each Topical Q0600   doxazosin  8 mg Oral Daily   feeding supplement (PROSource TF20)  60 mL Per Tube Daily   fentaNYL (SUBLIMAZE) injection  25 mcg Intravenous Once   ferric citrate  420 mg Oral TID WC   free water  100 mL Per Tube TID   gentamicin cream  1 Application Topical Daily   insulin aspart  0-6 Units Subcutaneous Q4H   lidocaine  1 patch Transdermal QHS   losartan  100 mg Oral Daily   multivitamin  1 tablet Per Tube QHS   nicotine  21 mg Transdermal Daily   pantoprazole  40 mg Intravenous Q12H   polyethylene glycol  17 g Oral BID   sennosides  15 mL Oral BID   Continuous Infusions:  dialysis solution 4.25% low-MG/low-CA     feeding supplement (OSMOLITE 1.5 CAL) 50 mL/hr at 08/27/22 0710   promethazine (PHENERGAN) injection (IM or IVPB) Stopped (08/18/22 1826)   promethazine (PHENERGAN) injection (IM or IVPB)     PRN Meds:.acetaminophen, bisacodyl, fentaNYL (SUBLIMAZE) injection, hydrALAZINE, lactulose, metoCLOPramide (REGLAN) injection, naLOXone (NARCAN)  injection, ondansetron **OR** ondansetron (ZOFRAN) IV, oxyCODONE, promethazine **OR** promethazine (PHENERGAN) injection (IM or IVPB) **OR** promethazine, promethazine (PHENERGAN) injection (IM or IVPB)   I have personally reviewed following labs and imaging studies  LABORATORY DATA: CBC: Recent Labs  Lab 08/23/22 0750 08/24/22 0952 08/25/22 0953 08/27/22 0825 08/28/22 0317  WBC 12.6* 10.7* 11.1* 14.7* 17.7*  NEUTROABS  --  8.3*  --   --   --   HGB  10.0* 9.5* 8.6* 8.9* 9.2*  HCT 30.4* 28.4* 26.1* 27.8* 29.0*  MCV 87.9 86.6 87.3 89.7 89.0  PLT 225 200 177 160 167     Basic Metabolic Panel: Recent Labs  Lab 08/23/22 2145 08/24/22 0952 08/24/22 1653 08/25/22 0954 08/26/22 0745 08/27/22 0825 08/28/22 0317  NA  --  137  --  138 138 135 135  K  --  4.0  --  3.7 4.2 4.2 4.5  CL  --  101  --  99 97* 99 97*  CO2  --  25  --  25 25 27 25   GLUCOSE  --  109*  --  153* 194* 212* 193*  BUN  --  76*  --  77* 89* 50* 68*  CREATININE  --  8.05*  --  8.19* 8.53* 5.08* 5.90*  CALCIUM  --  7.9*  --  8.1* 8.3* 8.3* 8.5*  MG 2.0 1.9 2.0 2.0  --   --   --   PHOS 6.0* 5.4* 5.5* 5.1* 4.6 3.1 3.2     GFR: Estimated Creatinine Clearance: 9.6 mL/min (A) (by C-G formula based on SCr of 5.9 mg/dL (H)).  Liver Function Tests:  Recent Labs  Lab 08/24/22 [phone] 08/25/22 0954 08/26/22 0745 08/27/22 0825 08/28/22 0317  ALBUMIN <1.5* <1.5* <1.5* <1.5* <1.5*    No results for input(s): "LIPASE", "AMYLASE" in the last 168 hours. No results for input(s): "AMMONIA" in the last 168 hours.  Coagulation Profile: No results for input(s): "INR", "PROTIME" in the last 168 hours.   Cardiac Enzymes: No results for input(s): "CKTOTAL", "CKMB", "CKMBINDEX", "TROPONINI" in the last 168 hours.  BNP (last 3 results) No results for input(s): "PROBNP" in the last 8760 hours.  Lipid Profile: No results for input(s): "CHOL", "HDL", "LDLCALC", "TRIG", "CHOLHDL", "LDLDIRECT" in the last 72 hours.  Thyroid Function Tests: No results for input(s): "TSH", "T4TOTAL", "FREET4", "T3FREE", "THYROIDAB" in the last 72 hours.  Anemia Panel: No results for input(s): "VITAMINB12", "FOLATE", "FERRITIN", "TIBC", "IRON", "RETICCTPCT" in the last 72 hours.  Urine analysis:    Component Value Date/Time   COLORURINE STRAW (A) 11/28/2020 1055   APPEARANCEUR CLEAR (A) 11/28/2020 1055   APPEARANCEUR Clear 01/03/2012 1117   LABSPEC 1.008 11/28/2020 1055   LABSPEC 1.004  01/03/2012 1117   PHURINE 7.0 11/28/2020 1055   GLUCOSEU 50 (A) 11/28/2020 1055   GLUCOSEU 100 (A) 01/21/2020 1223   HGBUR NEGATIVE 11/28/2020 1055   BILIRUBINUR NEGATIVE 11/28/2020 1055   BILIRUBINUR neg 07/30/2013 1127   BILIRUBINUR Negative 01/03/2012 1117   KETONESUR NEGATIVE 11/28/2020 1055   PROTEINUR 30 (A) 11/28/2020 1055   UROBILINOGEN 0.2 01/21/2020 1223   NITRITE NEGATIVE 11/28/2020 1055   LEUKOCYTESUR NEGATIVE 11/28/2020 1055   LEUKOCYTESUR Negative 01/03/2012 1117    Sepsis Labs: Lactic Acid, Venous    Component Value Date/Time   LATICACIDVEN 0.8 08/19/2022 0929    MICROBIOLOGY: Recent Results (from the past 240 hour(s))  Culture, blood (Routine X 2) w Reflex to ID Panel     Status: None   Collection Time: 08/19/22  9:29 AM   Specimen: BLOOD LEFT HAND  Result Value Ref Range Status   Specimen Description BLOOD LEFT HAND  Final   Special Requests   Final    BOTTLES DRAWN AEROBIC AND ANAEROBIC Blood Culture adequate volume   Culture   Final    NO GROWTH 5 DAYS Performed at [facility], [address], [place], [place] [address]    Report Status 08/24/2022 FINAL  Final  Culture, blood (Routine X 2) w Reflex to ID Panel     Status: None   Collection Time: 08/19/22  9:34 AM   Specimen: BLOOD LEFT HAND  Result Value Ref Range Status   Specimen Description BLOOD LEFT HAND  Final   Special Requests   Final    BOTTLES DRAWN AEROBIC AND ANAEROBIC Blood Culture adequate volume   Culture   Final    NO GROWTH 5 DAYS Performed at [facility], [address], [place], [place] [address]    Report Status 08/24/2022 FINAL  Final    RADIOLOGY STUDIES/RESULTS: No results found.   LOS: 11 days   Jeoffrey Massed, MD  [name]    To contact the attending provider between 7A-7P or the covering provider during after hours 7P-7A, please log into the web site [email or web address] and access using universal [employer] password for that web site. If you  do not have the password, please call the hospital operator.  08/28/2022, 9:49 AM

## 2022-08-29 ENCOUNTER — Inpatient Hospital Stay (HOSPITAL_COMMUNITY): Payer: Medicare PPO

## 2022-08-29 ENCOUNTER — Encounter (HOSPITAL_COMMUNITY): Admission: RE | Disposition: E | Payer: Self-pay | Source: Other Acute Inpatient Hospital | Attending: Internal Medicine

## 2022-08-29 DIAGNOSIS — S225XXA Flail chest, initial encounter for closed fracture: Secondary | ICD-10-CM

## 2022-08-29 LAB — CBC WITH DIFFERENTIAL/PLATELET
Abs Immature Granulocytes: 0.36 10*3/uL — ABNORMAL HIGH (ref 0.00–0.07)
Basophils Absolute: 0 10*3/uL (ref 0.0–0.1)
Basophils Relative: 0 %
Eosinophils Absolute: 0.4 10*3/uL (ref 0.0–0.5)
Eosinophils Relative: 2 %
HCT: 28.2 % — ABNORMAL LOW (ref 39.0–52.0)
Hemoglobin: 8.8 g/dL — ABNORMAL LOW (ref 13.0–17.0)
Immature Granulocytes: 2 %
Lymphocytes Relative: 6 %
Lymphs Abs: 1.1 10*3/uL (ref 0.7–4.0)
MCH: 28.3 pg (ref 26.0–34.0)
MCHC: 31.2 g/dL (ref 30.0–36.0)
MCV: 90.7 fL (ref 80.0–100.0)
Monocytes Absolute: 0.9 10*3/uL (ref 0.1–1.0)
Monocytes Relative: 5 %
Neutro Abs: 16 10*3/uL — ABNORMAL HIGH (ref 1.7–7.7)
Neutrophils Relative %: 85 %
Platelets: 161 10*3/uL (ref 150–400)
RBC: 3.11 MIL/uL — ABNORMAL LOW (ref 4.22–5.81)
RDW: 15.2 % (ref 11.5–15.5)
WBC: 18.8 10*3/uL — ABNORMAL HIGH (ref 4.0–10.5)
nRBC: 0 % (ref 0.0–0.2)

## 2022-08-29 LAB — RENAL FUNCTION PANEL
Albumin: <1.5 g/dL — ABNORMAL LOW (ref 3.5–5.0)
Anion gap: 11 (ref 5–15)
BUN: 41 mg/dL — ABNORMAL HIGH (ref 8–23)
CO2: 27 mmol/L (ref 22–32)
Calcium: 8.6 mg/dL — ABNORMAL LOW (ref 8.9–10.3)
Chloride: 102 mmol/L (ref 98–111)
Creatinine, Ser: 3.75 mg/dL — ABNORMAL HIGH (ref 0.61–1.24)
GFR, Estimated: 16 mL/min — ABNORMAL LOW (ref >60)
Glucose, Bld: 105 mg/dL — ABNORMAL HIGH (ref 70–99)
Phosphorus: 2.4 mg/dL — ABNORMAL LOW (ref 2.5–4.6)
Potassium: 4.5 mmol/L (ref 3.5–5.1)
Sodium: 140 mmol/L (ref 135–145)

## 2022-08-29 LAB — BLOOD CULTURE ID PANEL (REFLEXED) - BCID2

## 2022-08-29 LAB — GLUCOSE, CAPILLARY
Glucose-Capillary: 105 mg/dL — ABNORMAL HIGH (ref 70–99)
Glucose-Capillary: 110 mg/dL — ABNORMAL HIGH (ref 70–99)
Glucose-Capillary: 149 mg/dL — ABNORMAL HIGH (ref 70–99)
Glucose-Capillary: 181 mg/dL — ABNORMAL HIGH (ref 70–99)
Glucose-Capillary: 195 mg/dL — ABNORMAL HIGH (ref 70–99)
Glucose-Capillary: 217 mg/dL — ABNORMAL HIGH (ref 70–99)
Glucose-Capillary: 99 mg/dL (ref 70–99)

## 2022-08-29 LAB — C-REACTIVE PROTEIN: CRP: 13.9 mg/dL — ABNORMAL HIGH (ref <1.0)

## 2022-08-29 LAB — CULTURE, BLOOD (ROUTINE X 2): Special Requests: ADEQUATE

## 2022-08-29 LAB — BRAIN NATRIURETIC PEPTIDE: B Natriuretic Peptide: 1677.5 pg/mL — ABNORMAL HIGH (ref 0.0–100.0)

## 2022-08-29 LAB — MRSA NEXT GEN BY PCR, NASAL: MRSA by PCR Next Gen: NOT DETECTED

## 2022-08-29 SURGERY — ESOPHAGOGASTRODUODENOSCOPY (EGD) WITH PROPOFOL
Anesthesia: Monitor Anesthesia Care

## 2022-08-29 MED ORDER — VANCOMYCIN HCL 1500 MG/300ML IV SOLN
1500.0000 mg | Freq: Once | INTRAVENOUS | Status: AC
  Administered 2022-08-30: 1500 mg via INTRAVENOUS
  Filled 2022-08-29: qty 300

## 2022-08-29 NOTE — Progress Notes (Signed)
Pharmacy Antibiotic Note  [name] is a 77 y.o. male admitted on 07/25/2022 with bacteremia.  Pharmacy has been consulted for Vancomycin dosing. Patient is presenting with fall. Patient recently experienced upper GI bleed as well as aspiration pneumonia. Patient was on PD but was transferred to HD on 4/4. Patient currently comfort care with worsening status. Patient has R IJ catheter for HD as well as PD catheter. 4/6 BCID showed confirmation of Methicillin resistant Staph. Epidermidis. Possible true infection due to both HD and PD ports versus contamination. 4/6 BCX NG<24 hours. MRSA PCR negative. WBC 17.8>>18.8, Scr 5.9>>3.75 (CrCl 15.1), Tmax24 98.2, Hgb 8.4>>8.8.  Plan: Give Vancomycin bolus of 1500mg  X 1 IV Give additional doses of vancomycin based on HD schedule once finalized Trend WBC, temp, renal function  F/U infectious work-up Drug levels as indicated   Height: 5\' 6"  (167.6 cm) Weight: 66.3 kg (146 lb 2.6 oz) IBW/kg (Calculated) : 63.8  Temp (24hrs), Avg:97.8 F (36.6 C), Min:97.4 F (36.3 C), Max:98.2 F (36.8 C)  Recent Labs  Lab 08/25/22 0954 08/26/22 0745 08/27/22 0825 08/28/22 0317 08/28/22 1558 08/28/22 1758 08/28/22 2111 09/19/2022 0752  WBC  --   --  14.7* 17.7* 16.8* 18.2* 17.8* 18.8*  CREATININE 8.19* 8.53* 5.08* 5.90*  --   --   --  3.75*    Estimated Creatinine Clearance: 15.1 mL/min (A) (by C-G formula based on SCr of 3.75 mg/dL (H)).    Allergies  Allergen Reactions   Irbesartan Other (See Comments)    hyperkalemia   Cymbalta [Duloxetine Hcl]    Hydralazine Other (See Comments)    headache   Imdur [Isosorbide Nitrate] Other (See Comments)    headache   Rosuvastatin     Breast swelling/soreness   Atorvastatin Other (See Comments)    Muscle pain   Bystolic [Nebivolol Hcl]     Extreme fatigue    Thank you for allowing pharmacy to be a part of this patient's care.  Loretta Plume 08/29/2022 10:53 PM

## 2022-08-29 NOTE — Progress Notes (Signed)
PROGRESS NOTE        PATIENT DETAILS Name: [name] Age: 77 y.o. Sex: male Date of Birth: [birth year] Admit Date: 07/30/2022 Admitting Physician Alan Mulderobert Dorrell, MD ZOX:[id number]:Tullo, Mar Daringeresa L, MD  Brief Summary: Patient is a 77 y.o.  male with history of ESRD on peritoneal dialysis, A-fib on Eliquis, HTN, HLD-who was transferred from [facility] for concern for a flail chest following a fall with rib fracture.  Hospital course complicated by intractable vomiting-leading to upper GI bleeding from presumed Mallory-Weiss tear, aspiration pneumonia, severe debility/deconditioning.  See below for further details.  Significant events: 3/25-3/26>> [facility] following a [facility]-concern for flail chest-[facility]. 3/27>> abdominal pain following PD vol fill. Numerous episodes of vomiting evening-into night-vomitus became coffee-ground-some intermittent hypoxia. NPO-PPI infusion started.Renal MD decreasing PD vol fill. 3/31>> Per GI-no EGD-okay to restart anticoagulation.  IV heparin started.  SLP eval-keep n.p.o. 4/01>>Cortrak tube inserted 4/03>> sinus pauses on telemetry-bisoprolol/transdermal clonidine discontinued.  Significant studies: 3/25>> CT chest: Nondisplaced left seventh/eighth rib fracture, possible 12th left rib fracture. 3/25>> CT abdomen/pelvis: Extensive pneumoperitoneum-in the setting of PD catheter.  6.4 cm left cystic lesion-suggestive of a hemorrhagic cyst.  Significant microbiology data: 3/28>> blood cultures: No growth  Procedures: None  Consults: Nephrology Trauma surgery GI Palliative  Subjective: Patient in bed appears extremely weak and frail, states he has generalized body ache, does not feel good, mild cough, short of breath, NG tube hurts in the throat,  Objective: Vitals: Blood pressure (!) 167/53, pulse 73, temperature 98.2 F (36.8 C), temperature source Oral, resp. rate 15, height 5\' 6"   (1.676 m), weight 66.3 kg, SpO2 99 %.   Exam:  Patient in bed appears somnolent and weak, no focal deficits [place].AT,PERRAL Supple Neck, No JVD,   Symmetrical Chest wall movement, Good air movement bilaterally, CTAB RRR,No Gallops, Rubs or new Murmurs,  +ve B.Sounds, Abd Soft, No tenderness,   No Cyanosis, Clubbing or edema   Assessment/Plan:  Mechanical fall with left seventh, eighth and possible 12th rib fracture Overall much better-significantly less pain Continue multimodal pain regimen Pulmonary tolerating Mobilization with PT/OT Trauma team has signed off.    Intractable nausea/vomiting Upper GI bleed with coffee-ground emesis-?  Mallory-Weiss tear, some melena.  Eliquis on hold on PPI. Nausea/vomiting/coffee-ground emesis resolved.  Had approximately 10-15 episodes of vomiting 3/27 overnight Have some melena, Eliquis on hold on PPI, wife now leaning towards full comfort measures, currently not a candidate for EGD colonoscopy.  Monitor on PPI.     Acute hypoxic respiratory failure Multifactorial-initially due to rib fractures/splinting-then developed aspiration pneumonia-likely due to pulmonary edema in the setting of ineffective peritoneal dialysis. Overall improved-requiring only minimal amount of oxygen-1-2 L.  Continue to treat underlying etiologies is much as possible Encourage pulmonary tolerating.  Aspiration pneumonia Overall clinically improved-afebrile  Cultures negative so far IV Unasyn x 5 days total completed 4/1 Unfortunately leukocytosis is stable increasing-he looks clinically unchanged-plan is to repeat cultures today-check CT chest.  Due to overall stability-unclear source of infection-hold off on starting antibiotics unless he is febrile or becomes hemodynamically unstable.    Oropharyngeal dysphagia Unfortunately has developed severe deconditioning-causing oropharyngeal dysphagia-therapy recommended n.p.o. status  Cortrak tube placed on 4/1-hopefully we  can get him optimized over the next few days and attempt to rechallenge with oral intake over the next several days.  SLP following.  Left renal hemorrhagic cyst Incidental finding on CT abdomen Discussed with renal MD-no reason to continue holding Eliquis for this issue-as hemoglobin stable.  ESRD on peritoneal dialysis Hyperkalemia Has been switched to HD as SNF discharge plan-unable to do PD at SNF First HD on 4/4-tolerated well Nephrology following.    Acute urinary retention  Occurred on 3/28 when patient had vomiting-extensive pain issues Foley catheter removed on 3/31  Continue to watch for urinary retention.  PAF Sinus rhythm on telemetry Continue amiodarone Eliquis initially on hold-IV heparin started 3/31-without any signs of rebleeding-switch to Eliquis 4/2.    Sinus pauses Occurred overnight on 4/3 Asymptomatic-he was sleeping Beta-blocker/transdermal clonidine discontinued-no further episodes on telemetry-watch closely.    Acute on chronic chronic HFrEF See above Volume removal with dialysis . CAD (LHC 06/30/2022-two-vessel disease-70-80% mid LAD stenosis, chronic total occlusion of RCA with left-right collaterals-optimization of medical therapy recommended) Not on antiplatelets as he is on Eliquis Intolerant to statin Currently without any anginal symptoms-his left-sided musculoskeletal chest pain is from rib fractures.  HTN BP reasonable on amlodipine/losartan/doxazosin Bisoprolol/transdermal clonidine stopped on 4/3 due to sinus pauses Follow.  Debility/deconditioning Likely due to acute illness-has had significant decline in functional status over the past several days PT/OT-now recommending SNF Multiple issues/setbacks during this hospitalization-now with severe failure to thrive-will ask palliative care to evaluate-remains a full code as of now.  Thankfully seems to have overall stabilized-hopefully HD has begun-he will slowly stabilize over the next  several days.  If he fails to improve-we can contemplate another goals of care discussion over the next several days.  Nutrition Status: Nutrition Problem: Moderate Malnutrition Etiology: chronic illness Signs/Symptoms: severe muscle depletion, mild fat depletion Interventions: Tube feeding, MVI    BMI: Estimated body mass index is 23.59 kg/m as calculated from the following:   Height as of this encounter: 5\' 6"  (1.676 m).   Weight as of this encounter: 66.3 kg.   Code status:   Code Status: DNR   DVT Prophylaxis: Place TED hose Start: 08/13/2022 2148    Family Communication: Tito Parkhouse [phone] Sep 19, 2022 - DNR, comfort care   Disposition Plan: Status is: Inpatient Remains inpatient appropriate because: Severity of illness   Planned Discharge Destination:SNF   Diet: Diet Order             Diet NPO time specified  Diet effective now                     Antimicrobial agents: Anti-infectives (From admission, onward)    Start     Dose/Rate Route Frequency Ordered Stop   08/24/22 1505  ceFAZolin (ANCEF) IVPB 2g/100 mL premix        over 30 Minutes Intravenous Continuous PRN 08/24/22 1538 08/24/22 1505   08/24/22 1315  ceFAZolin (ANCEF) IVPB 2g/100 mL premix        2 g 200 mL/hr over 30 Minutes Intravenous To Radiology 08/24/22 1225 08/25/22 1315   08/19/22 1100  Ampicillin-Sulbactam (UNASYN) 3 g in sodium chloride 0.9 % 100 mL IVPB        3 g 200 mL/hr over 30 Minutes Intravenous Every 24 hours 08/19/22 0957 08/23/22 1141        MEDICATIONS: Scheduled Meds:  amiodarone  200 mg Oral Daily   amLODipine  10 mg Oral Daily   Chlorhexidine Gluconate Cloth  6 each Topical Q0600   doxazosin  8 mg Oral Daily   feeding supplement (PROSource TF20)  60 mL Per  Tube Daily   fentaNYL (SUBLIMAZE) injection  25 mcg Intravenous Once   ferric citrate  420 mg Oral TID WC   free water  100 mL Per Tube TID   gentamicin cream  1 Application Topical Daily    insulin aspart  0-6 Units Subcutaneous Q4H   lidocaine  1 patch Transdermal QHS   losartan  100 mg Oral Daily   multivitamin  1 tablet Per Tube QHS   nicotine  21 mg Transdermal Daily   pantoprazole  40 mg Intravenous Q12H   polyethylene glycol  17 g Oral BID   sennosides  15 mL Oral BID   Continuous Infusions:  sodium chloride 20 mL/hr at 08/28/22 1945   feeding supplement (OSMOLITE 1.5 CAL) Stopped (08/28/2022 0000)   promethazine (PHENERGAN) injection (IM or IVPB) Stopped (08/18/22 1826)   promethazine (PHENERGAN) injection (IM or IVPB)     PRN Meds:.acetaminophen, bisacodyl, fentaNYL (SUBLIMAZE) injection, hydrALAZINE, lactulose, metoCLOPramide (REGLAN) injection, naLOXone (NARCAN)  injection, ondansetron **OR** ondansetron (ZOFRAN) IV, oxyCODONE, promethazine **OR** promethazine (PHENERGAN) injection (IM or IVPB) **OR** promethazine, promethazine (PHENERGAN) injection (IM or IVPB)   I have personally reviewed following labs and imaging studies  LABORATORY DATA:  Recent Labs  Lab 08/24/22 0952 08/25/22 0953 08/28/22 0317 08/28/22 1558 08/28/22 1758 08/28/22 2111 09/10/2022 0752  WBC 10.7*   < > 17.7* 16.8* 18.2* 17.8* 18.8*  HGB 9.5*   < > 9.2* 8.8* 8.6* 8.4* 8.8*  HCT 28.4*   < > 29.0* 27.1* 27.3* 26.1* 28.2*  PLT 200   < > 167 154 151 144* 161  MCV 86.6   < > 89.0 88.3 89.8 88.2 90.7  MCH 29.0   < > 28.2 28.7 28.3 28.4 28.3  MCHC 33.5   < > 31.7 32.5 31.5 32.2 31.2  RDW 16.0*   < > 15.5 15.4 15.4 15.3 15.2  LYMPHSABS 1.0  --   --   --   --   --  1.1  MONOABS 0.8  --   --   --   --   --  0.9  EOSABS 0.4  --   --   --   --   --  0.4  BASOSABS 0.0  --   --   --   --   --  0.0   < > = values in this interval not displayed.    Recent Labs  Lab 08/23/22 2145 08/24/22 0952 08/24/22 1653 08/25/22 0953 08/25/22 0954 08/26/22 0745 08/27/22 0825 08/28/22 0317 09/08/2022 0752  NA  --  137  --   --  138 138 135 135 140  K  --  4.0  --   --  3.7 4.2 4.2 4.5 4.5  CL  --   101  --   --  99 97* 99 97* 102  CO2  --  25  --   --  ANIONGAP  --  11  --   --  14 16* GLUCOSE  --  109*  --   --  153* 194* 212* 193* 105*  BUN  --  76*  --   --  77* 89* 50* 68* 41*  CREATININE  --  8.05*  --   --  8.19* 8.53* 5.08* 5.90* 3.75*  ALBUMIN  --  <1.5*  --   --  <1.5* <1.5* <1.5* <1.5* <1.5*  CRP  --   --   --   --   --   --   --   --  13.9*  HGBA1C  --   --   --  6.1*  --   --   --   --   --   BNP  --   --   --   --   --   --   --   --  1,677.5*  MG 2.0 1.9 2.0  --  2.0  --   --   --   --   CALCIUM  --  7.9*  --   --  8.1* 8.3* 8.3* 8.5* 8.6*    RADIOLOGY STUDIES/RESULTS: DG Chest Port 1 View  Result Date: 09/10/2022 CLINICAL DATA:  [id number] with shortness of breath. EXAM: PORTABLE CHEST 1 VIEW COMPARISON:  Chest CT without contrast earlier today FINDINGS: 5:53 a.m. Dobbhoff feeding tube enters the stomach with the full intragastric course not filmed. There is a right IJ dialysis catheter terminating in the upper right atrium, as before. Stable moderate cardiomegaly. There is perihilar vascular congestion and mild interstitial edema. There are bilateral layering pleural effusions, with overlying atelectatic haziness in the bases. No focal pneumonia is evident. The aorta is tortuous and calcified with stable mediastinum. Osteopenia. Compare: Overall aeration is unchanged.  No new abnormality. IMPRESSION: 1. Cardiomegaly with perihilar vascular congestion and mild interstitial edema. 2. Layering bilateral pleural effusions. No focal pneumonia. 3. Support apparatus stable. 4. Aortic atherosclerosis. Electronically Signed   By: Almira Bar M.D.   On: 09/13/2022 07:13   CT CHEST WO CONTRAST  Result Date: 09/16/2022 CLINICAL DATA:  Evaluate for possible effusion EXAM: CT CHEST WITHOUT CONTRAST TECHNIQUE: Multidetector CT imaging of the chest was performed following the standard protocol without IV contrast. RADIATION DOSE REDUCTION: This exam was performed  according to the departmental dose-optimization program which includes automated exposure control, adjustment of the mA and/or kV according to patient size and/or use of iterative reconstruction technique. COMPARISON:  Chest x-ray from the previous day as well as chest and abdomen CT from 08/16/2022 FINDINGS: Cardiovascular: Somewhat limited due to the lack of IV contrast. Right jugular dialysis catheter is noted. Extensive atherosclerotic calcifications of the thoracic aorta are noted. No aneurysmal dilatation is seen. The heart is enlarged in size. Coronary calcifications are noted. Mediastinum/Nodes: Thoracic inlet is within normal limits. No sizable hilar or mediastinal adenopathy is noted. Feeding catheter is seen extending into the stomach. The esophagus as visualized is within normal limits. Lungs/Pleura: Bilateral pleural effusions are noted right greater than left with associated lower lobe consolidation. Emphysematous changes are seen. No parenchymal nodules are seen. No pneumothorax is noted. Upper Abdomen: Visualized upper abdomen again demonstrates multi cystic changes of the kidneys incompletely evaluated on this noncontrast study. The overall appearance however is stable from the prior CT. Musculoskeletal: No chest wall mass or suspicious bone lesions identified. IMPRESSION: Bilateral pleural effusions with associated consolidation right greater than left. These changes have increased in the interval from the prior exam. Stable cystic changes of the kidneys in the upper abdomen. Tubes and lines as described above. Aortic Atherosclerosis (ICD10-I70.0) and Emphysema (ICD10-J43.9). Electronically Signed   By: Alcide Clever M.D.   On: 09/17/2022 01:24   DG Chest Port 1 View  Result Date: 08/28/2022 CLINICAL DATA:  77 year old male with shortness of breath. EXAM: PORTABLE CHEST 1 VIEW COMPARISON:  Portable chest 08/25/2022 and earlier. FINDINGS: Portable AP semi upright view at 0716 hours. Calcified  aortic atherosclerosis. Stable cardiomegaly and mediastinal contours. Stable right chest dual lumen dialysis type catheter. Enteric feeding tube remains in place, courses  to the abdomen with tip not included. Evidence of ongoing small right pleural effusion. Small left pleural effusion may have regressed and left lung base ventilation appears mildly improved. Stable probably chronic increased pulmonary interstitial markings otherwise. No pneumothorax, consolidation, definite pulmonary edema. Partially visible gas fills stomach. No acute osseous abnormality identified. IMPRESSION: 1. Stable lines and tubes. 2. Probable small pleural effusion(s). Improved left lung base ventilation. No new cardiopulmonary abnormality. Electronically Signed   By: Odessa Fleming M.D.   On: 08/28/2022 10:47     LOS: 12 days   Signature  -    Susa Raring M.D on 09/07/2022 at 11:13 AM   -  To page go to [email or web address]

## 2022-08-29 NOTE — Progress Notes (Addendum)
Patient ID: [name], male   DOB: Jan 08, 1946, 77 y.o.   MRN: [id number] [facility] Progress Note   Assessment/ Plan:   1.  Mechanical fall with fracture of the left seventh/eighth and possibly 12th ribs: Originally transferred for suspicion of flail chest however this was ruled out by trauma surgery.  Ongoing efforts at symptom management. 2. ESRD TTS: Continue peritoneal dialysis with cautious increase of ultrafiltration for volume to 1.8 L. Seen on PD and currently still hooked up, waiting for full drain. He seems more agitated and dyspneic today despite having 1.8L net UF on Sat. 5sec pause overnight on 4/2 -> Clonidine and bblr held.  Appreciate VIR placing the RIJ TC on 4/2 -> tolerated iHD #1 on 4/4 and on Sat 4/6 (1.8L net UF). He is getting tired and would be reasonable to revisit possible transition to comfort measures; he stated today he just wanted to go home. Spouse wanted to transition given lack of progress and poor prognosis but the patient at that time wanted everything done.  Will plan on flushing the PD either on Mon or Tues; x3.  Renal navigator aware of pt and will assist w/ placement as he will be headed to a SNF. Will hopefully be able to get a weekly flush at his HD center while in a SNF (will try to set that up).  3. Anemia: Currently with stable hemoglobin/hematocrit after having coffee-ground emesis last week, concerns raised regarding Mallory-Weiss tear versus upper GI bleed. Now with dark tarry stool;  GI planning on EGD tomorrow. Wonder if this is from [place] as H/H stable.  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  Switched from sevelamer to Auryxia (phos now 5.5 on 4/3). 5. Nutrition: Continue renal diet with fluid restriction/phosphorus restriction. 6.  Acute hypoxic respiratory failure: Suspected to be likely secondary to aspiration pneumonia in the setting of recurrent  emesis/intolerance to PD fill volume.  Started on intravenous Unasyn with improvement of leukocytosis but continued dependency on oxygen supplementation/hypoxia noted.    Subjective:   Tolerated HD #2 Sat; after HD #1 breathing was more comfortable but he seems more tachypneic today. Noted dark tarry stool overnight by RN. .    Objective:   BP (!) 141/51 (BP Location: Right Arm)   Pulse 70   Temp 98.3 F (36.8 C) (Oral)   Resp 17   Ht 5\' 6"  (1.676 m)   Wt 66.3 kg   SpO2 96%   BMI 23.59 kg/m   Physical Exam: Gen: Fatigued resting in bed; not tachypneic. On oxygen via nasal cannula CVS: Pulse regular rhythm, normal rate, S1 and S2 normal Resp: Poor inspiratory effort with decreased breath sounds over bases, no rales/rhonchi Abd: Soft, slightly distended, bowel sounds normal Ext: No lower extremity edema Access: RIJ TC  Labs: BMET Recent Labs  Lab 08/22/22 0825 08/23/22 0750 08/23/22 2145 08/24/22 0952 08/24/22 1653 08/25/22 0954 08/26/22 0745 08/27/22 0825 08/28/22 0317  NA 135 136  --  137  --  138 138 135 135  K 5.2* 4.3  --  4.0  --  3.7 4.2 4.2 4.5  CL 100 101  --  101  --  99 97* 99 97*  CO2 21* 23  --  25  --  25 25 27 25   GLUCOSE 92 165*  --  109*  --  153* 194* 212* 193*  BUN 75* 75*  --  76*  --  77* 89* 50* 68*  CREATININE 8.73* 8.61*  --  8.05*  --  8.19* 8.53* 5.08* 5.90*  CALCIUM 7.9* 8.0*  --  7.9*  --  8.1* 8.3* 8.3* 8.5*  PHOS 6.9* 6.9* 6.0* 5.4* 5.5* 5.1* 4.6 3.1 3.2   CBC Recent Labs  Lab 08/24/22 0952 08/25/22 0953 08/28/22 0317 08/28/22 1558 08/28/22 1758 08/28/22 2111  WBC 10.7*   < > 17.7* 16.8* 18.2* 17.8*  NEUTROABS 8.3*  --   --   --   --   --   HGB 9.5*   < > 9.2* 8.8* 8.6* 8.4*  HCT 28.4*   < > 29.0* 27.1* 27.3* 26.1*  MCV 86.6   < > 89.0 88.3 89.8 88.2  PLT 200   < > 167 154 151 144*   < > = values in this interval not displayed.      Medications:     amiodarone  200 mg Oral Daily   amLODipine  10 mg Oral Daily    Chlorhexidine Gluconate Cloth  6 each Topical Q0600   doxazosin  8 mg Oral Daily   feeding supplement (PROSource TF20)  60 mL Per Tube Daily   fentaNYL (SUBLIMAZE) injection  25 mcg Intravenous Once   ferric citrate  420 mg Oral TID WC   free water  100 mL Per Tube TID   gentamicin cream  1 Application Topical Daily   insulin aspart  0-6 Units Subcutaneous Q4H   lidocaine  1 patch Transdermal QHS   losartan  100 mg Oral Daily   multivitamin  1 tablet Per Tube QHS   nicotine  21 mg Transdermal Daily   pantoprazole  40 mg Intravenous Q12H   polyethylene glycol  17 g Oral BID   sennosides  15 mL Oral BID

## 2022-08-29 NOTE — Progress Notes (Signed)
Patient has 2 black tarry stool, soft, and moderate in amount.

## 2022-08-29 NOTE — Progress Notes (Signed)
Gastroenterology Inpatient Follow Up    Subjective: Patient and his family have opted for comfort measures  Objective: Vital signs in last 24 hours: Temp:  [97.7 F (36.5 C)-98.3 F (36.8 C)] 98.2 F (36.8 C) (04/07 1100) Pulse Rate:  [70-75] 75 (04/07 1200) Resp:  [15-20] 15 (04/07 1100) BP: (141-167)/(51-62) 163/52 (04/07 1200) SpO2:  [94 %-99 %] 94 % (04/07 1200) Weight:  [66.3 kg] 66.3 kg (04/07 0300) Last BM Date : 09/13/2022  Intake/Output from previous day: 04/06 0701 - 04/07 0700 In: -  Out: 2200 [Urine:400] Intake/Output this shift: No intake/output data recorded.  Cardio: regular rate  Lab Results: Recent Labs    08/28/22 1758 08/28/22 2111 08/28/2022 0752  WBC 18.2* 17.8* 18.8*  HGB 8.6* 8.4* 8.8*  HCT 27.3* 26.1* 28.2*  PLT 151 144* 161   BMET Recent Labs    08/27/22 0825 08/28/22 0317 08/25/2022 0752  NA 135 135 140  K 4.2 4.5 4.5  CL 99 97* 102  CO2 27 25 27   GLUCOSE 212* 193* 105*  BUN 50* 68* 41*  CREATININE 5.08* 5.90* 3.75*  CALCIUM 8.3* 8.5* 8.6*   LFT Recent Labs    09/18/2022 0752  ALBUMIN <1.5*   PT/INR No results for input(s): "LABPROT", "INR" in the last 72 hours. Hepatitis Panel No results for input(s): "HEPBSAG", "HCVAB", "HEPAIGM", "HEPBIGM" in the last 72 hours. C-Diff No results for input(s): "CDIFFTOX" in the last 72 hours.  Studies/Results: DG Chest Port 1 View  Result Date: 09/04/2022 CLINICAL DATA:  [id number] with shortness of breath. EXAM: PORTABLE CHEST 1 VIEW COMPARISON:  Chest CT without contrast earlier today FINDINGS: 5:53 a.m. Dobbhoff feeding tube enters the stomach with the full intragastric course not filmed. There is a right IJ dialysis catheter terminating in the upper right atrium, as before. Stable moderate cardiomegaly. There is perihilar vascular congestion and mild interstitial edema. There are bilateral layering pleural effusions, with overlying atelectatic haziness in the bases. No focal pneumonia is  evident. The aorta is tortuous and calcified with stable mediastinum. Osteopenia. Compare: Overall aeration is unchanged.  No new abnormality. IMPRESSION: 1. Cardiomegaly with perihilar vascular congestion and mild interstitial edema. 2. Layering bilateral pleural effusions. No focal pneumonia. 3. Support apparatus stable. 4. Aortic atherosclerosis. Electronically Signed   By: Almira Bar M.D.   On: 09/07/2022 07:13   CT CHEST WO CONTRAST  Result Date: 08/28/2022 CLINICAL DATA:  Evaluate for possible effusion EXAM: CT CHEST WITHOUT CONTRAST TECHNIQUE: Multidetector CT imaging of the chest was performed following the standard protocol without IV contrast. RADIATION DOSE REDUCTION: This exam was performed according to the departmental dose-optimization program which includes automated exposure control, adjustment of the mA and/or kV according to patient size and/or use of iterative reconstruction technique. COMPARISON:  Chest x-ray from the previous day as well as chest and abdomen CT from 08/16/2022 FINDINGS: Cardiovascular: Somewhat limited due to the lack of IV contrast. Right jugular dialysis catheter is noted. Extensive atherosclerotic calcifications of the thoracic aorta are noted. No aneurysmal dilatation is seen. The heart is enlarged in size. Coronary calcifications are noted. Mediastinum/Nodes: Thoracic inlet is within normal limits. No sizable hilar or mediastinal adenopathy is noted. Feeding catheter is seen extending into the stomach. The esophagus as visualized is within normal limits. Lungs/Pleura: Bilateral pleural effusions are noted right greater than left with associated lower lobe consolidation. Emphysematous changes are seen. No parenchymal nodules are seen. No pneumothorax is noted. Upper Abdomen: Visualized upper abdomen again demonstrates multi  cystic changes of the kidneys incompletely evaluated on this noncontrast study. The overall appearance however is stable from the prior CT.  Musculoskeletal: No chest wall mass or suspicious bone lesions identified. IMPRESSION: Bilateral pleural effusions with associated consolidation right greater than left. These changes have increased in the interval from the prior exam. Stable cystic changes of the kidneys in the upper abdomen. Tubes and lines as described above. Aortic Atherosclerosis (ICD10-I70.0) and Emphysema (ICD10-J43.9). Electronically Signed   By: Alcide Clever M.D.   On: 09/01/2022 01:24   DG Chest Port 1 View  Result Date: 08/28/2022 CLINICAL DATA:  77 year old male with shortness of breath. EXAM: PORTABLE CHEST 1 VIEW COMPARISON:  Portable chest 08/25/2022 and earlier. FINDINGS: Portable AP semi upright view at 0716 hours. Calcified aortic atherosclerosis. Stable cardiomegaly and mediastinal contours. Stable right chest dual lumen dialysis type catheter. Enteric feeding tube remains in place, courses to the abdomen with tip not included. Evidence of ongoing small right pleural effusion. Small left pleural effusion may have regressed and left lung base ventilation appears mildly improved. Stable probably chronic increased pulmonary interstitial markings otherwise. No pneumothorax, consolidation, definite pulmonary edema. Partially visible gas fills stomach. No acute osseous abnormality identified. IMPRESSION: 1. Stable lines and tubes. 2. Probable small pleural effusion(s). Improved left lung base ventilation. No new cardiopulmonary abnormality. Electronically Signed   By: Odessa Fleming M.D.   On: 08/28/2022 10:47    Medications: I have reviewed the patient's current medications. Scheduled:  amiodarone  200 mg Oral Daily   amLODipine  10 mg Oral Daily   Chlorhexidine Gluconate Cloth  6 each Topical Q0600   doxazosin  8 mg Oral Daily   feeding supplement (PROSource TF20)  60 mL Per Tube Daily   fentaNYL (SUBLIMAZE) injection  25 mcg Intravenous Once   ferric citrate  420 mg Oral TID WC   free water  100 mL Per Tube TID   gentamicin  cream  1 Application Topical Daily   insulin aspart  0-6 Units Subcutaneous Q4H   lidocaine  1 patch Transdermal QHS   losartan  100 mg Oral Daily   multivitamin  1 tablet Per Tube QHS   nicotine  21 mg Transdermal Daily   pantoprazole  40 mg Intravenous Q12H   polyethylene glycol  17 g Oral BID   sennosides  15 mL Oral BID   Continuous:  sodium chloride 20 mL/hr at 08/28/22 1945   feeding supplement (OSMOLITE 1.5 CAL) Stopped (08/28/2022 0000)   promethazine (PHENERGAN) injection (IM or IVPB) Stopped (08/18/22 1826)   promethazine (PHENERGAN) injection (IM or IVPB)     TGP:QDIYMEBRAXENM, bisacodyl, fentaNYL (SUBLIMAZE) injection, hydrALAZINE, lactulose, metoCLOPramide (REGLAN) injection, naLOXone (NARCAN)  injection, ondansetron **OR** ondansetron (ZOFRAN) IV, oxyCODONE, promethazine **OR** promethazine (PHENERGAN) injection (IM or IVPB) **OR** promethazine, promethazine (PHENERGAN) injection (IM or IVPB)  Assessment/Plan: 77 year old male with history of HFrEF (EF 30 to 35%), A-fib/flutter on Eliquis, CVA, PAD, DM, RCC status post left nephrectomy, ESRD on PD presented with a fall. We are consulted for melena on 4/6. The GI team was consulted earlier during this admission for coffee-ground emesis and it was decided at that time that patient most likely had a Mallory-Weiss tear. Thus EGD was deferred and PPI therapy was recommended.  Hemoglobin has remained stable. EGD was originally scheduled for today but patient and his family have opted for comfort care.  Thus EGD was cancelled per patient and family preference.  Can continue PPI therapy if this is in line with  the patient and his family's wishes.  GI will sign off for now.  Please call back if any new questions arise.   LOS: 12 days   Imogene Burn 24-Sep-2022, 2:56 PM

## 2022-08-29 NOTE — Progress Notes (Signed)
PHARMACY - PHYSICIAN COMMUNICATION CRITICAL VALUE ALERT - BLOOD CULTURE IDENTIFICATION (BCID)  [name] is an 77 y.o. male who presented to [facility] on 08/14/2022 with a chief complaint of fall  Assessment: PD patient with recent switch to [facility]  Name of physician (or Provider) Contacted: Dr. Joneen Roach  Current antibiotics: None  Changes to prescribed antibiotics recommended:  Start Vancomycin   Results for orders placed or performed during the hospital encounter of 08/09/2022  Blood Culture ID Panel (Reflexed) (Collected: 08/28/2022  3:33 PM)  Result Value Ref Range   Enterococcus faecalis NOT DETECTED NOT DETECTED   Enterococcus Faecium NOT DETECTED NOT DETECTED   Listeria monocytogenes NOT DETECTED NOT DETECTED   Staphylococcus species DETECTED (A) NOT DETECTED   Staphylococcus aureus (BCID) NOT DETECTED NOT DETECTED   Staphylococcus epidermidis DETECTED (A) NOT DETECTED   Staphylococcus lugdunensis NOT DETECTED NOT DETECTED   Streptococcus species NOT DETECTED NOT DETECTED   Streptococcus agalactiae NOT DETECTED NOT DETECTED   Streptococcus pneumoniae NOT DETECTED NOT DETECTED   Streptococcus pyogenes NOT DETECTED NOT DETECTED   A.calcoaceticus-baumannii NOT DETECTED NOT DETECTED   Bacteroides fragilis NOT DETECTED NOT DETECTED   Enterobacterales NOT DETECTED NOT DETECTED   Enterobacter cloacae complex NOT DETECTED NOT DETECTED   Escherichia coli NOT DETECTED NOT DETECTED   Klebsiella aerogenes NOT DETECTED NOT DETECTED   Klebsiella oxytoca NOT DETECTED NOT DETECTED   Klebsiella pneumoniae NOT DETECTED NOT DETECTED   Proteus species NOT DETECTED NOT DETECTED   Salmonella species NOT DETECTED NOT DETECTED   Serratia marcescens NOT DETECTED NOT DETECTED   Haemophilus influenzae NOT DETECTED NOT DETECTED   Neisseria meningitidis NOT DETECTED NOT DETECTED   Pseudomonas aeruginosa NOT DETECTED NOT DETECTED   Stenotrophomonas maltophilia NOT DETECTED NOT DETECTED   Candida  albicans NOT DETECTED NOT DETECTED   Candida auris NOT DETECTED NOT DETECTED   Candida glabrata NOT DETECTED NOT DETECTED   Candida krusei NOT DETECTED NOT DETECTED   Candida parapsilosis NOT DETECTED NOT DETECTED   Candida tropicalis NOT DETECTED NOT DETECTED   Cryptococcus neoformans/gattii NOT DETECTED NOT DETECTED   Methicillin resistance mecA/C DETECTED (A) NOT DETECTED   Abran Duke, PharmD, BCPS Clinical Pharmacist Phone: [phone]

## 2022-08-30 ENCOUNTER — Other Ambulatory Visit: Payer: Self-pay | Admitting: Nephrology

## 2022-08-30 LAB — CBC WITH DIFFERENTIAL/PLATELET
Abs Immature Granulocytes: 0.24 10*3/uL — ABNORMAL HIGH (ref 0.00–0.07)
Basophils Absolute: 0 10*3/uL (ref 0.0–0.1)
Basophils Relative: 0 %
Eosinophils Absolute: 0.5 10*3/uL (ref 0.0–0.5)
Eosinophils Relative: 3 %
HCT: 25.8 % — ABNORMAL LOW (ref 39.0–52.0)
Hemoglobin: 8.2 g/dL — ABNORMAL LOW (ref 13.0–17.0)
Immature Granulocytes: 1 %
Lymphocytes Relative: 6 %
Lymphs Abs: 1 10*3/uL (ref 0.7–4.0)
MCH: 28.4 pg (ref 26.0–34.0)
MCHC: 31.8 g/dL (ref 30.0–36.0)
MCV: 89.3 fL (ref 80.0–100.0)
Monocytes Absolute: 0.8 10*3/uL (ref 0.1–1.0)
Monocytes Relative: 5 %
Neutro Abs: 15.5 10*3/uL — ABNORMAL HIGH (ref 1.7–7.7)
Neutrophils Relative %: 85 %
Platelets: 130 10*3/uL — ABNORMAL LOW (ref 150–400)
RBC: 2.89 MIL/uL — ABNORMAL LOW (ref 4.22–5.81)
RDW: 15.2 % (ref 11.5–15.5)
WBC: 18.1 10*3/uL — ABNORMAL HIGH (ref 4.0–10.5)
nRBC: 0 % (ref 0.0–0.2)

## 2022-08-30 LAB — C-REACTIVE PROTEIN: CRP: 12.5 mg/dL — ABNORMAL HIGH (ref <1.0)

## 2022-08-30 LAB — CULTURE, BLOOD (ROUTINE X 2)

## 2022-08-30 LAB — GLUCOSE, CAPILLARY
Glucose-Capillary: 177 mg/dL — ABNORMAL HIGH (ref 70–99)
Glucose-Capillary: 186 mg/dL — ABNORMAL HIGH (ref 70–99)
Glucose-Capillary: 199 mg/dL — ABNORMAL HIGH (ref 70–99)
Glucose-Capillary: 209 mg/dL — ABNORMAL HIGH (ref 70–99)

## 2022-08-30 LAB — BRAIN NATRIURETIC PEPTIDE: B Natriuretic Peptide: 1154.7 pg/mL — ABNORMAL HIGH (ref 0.0–100.0)

## 2022-08-30 MED ORDER — SODIUM CHLORIDE 0.9 % IV SOLN: 25.0000 mg | Freq: Four times a day (QID) | INTRAVENOUS | Status: DC | PRN

## 2022-08-30 MED ORDER — LORAZEPAM 2 MG/ML IJ SOLN: 1.0000 mg | INTRAMUSCULAR | Status: DC | PRN

## 2022-08-30 MED ORDER — HYDROMORPHONE HCL-NACL 50-0.9 MG/50ML-% IV SOLN
2.0000 mg/h | INTRAVENOUS | Status: DC
  Administered 2022-08-30: 2 mg/h via INTRAVENOUS
  Filled 2022-08-30: qty 50

## 2022-08-30 MED ORDER — LORAZEPAM 2 MG/ML PO CONC: 1.0000 mg | ORAL | Status: DC | PRN

## 2022-08-30 MED ORDER — BIOTENE DRY MOUTH MT LIQD: 15.0000 mL | OROMUCOSAL | Status: DC | PRN

## 2022-08-30 MED ORDER — HYDROMORPHONE BOLUS VIA INFUSION: 1.0000 mg | INTRAVENOUS | Status: DC | PRN

## 2022-08-30 MED ORDER — LORAZEPAM 1 MG PO TABS: 1.0000 mg | ORAL_TABLET | ORAL | Status: DC | PRN

## 2022-08-30 MED ORDER — POLYVINYL ALCOHOL 1.4 % OP SOLN: 1.0000 [drp] | Freq: Four times a day (QID) | OPHTHALMIC | Status: DC | PRN

## 2022-08-30 MED ORDER — ALBUTEROL SULFATE (2.5 MG/3ML) 0.083% IN NEBU: 2.5000 mg | INHALATION_SOLUTION | RESPIRATORY_TRACT | Status: DC | PRN

## 2022-08-31 ENCOUNTER — Ambulatory Visit: Payer: Self-pay | Admitting: Internal Medicine

## 2022-08-31 LAB — CULTURE, BLOOD (ROUTINE X 2)
Culture: NO GROWTH
Special Requests: ADEQUATE

## 2022-09-04 IMAGING — CR DG CHEST 2V
1 series · 2 of 2 positions shown · non-contrast
Comparison: 04/01/2021, 11/28/2020.  CT 11/06/2020

CLINICAL DATA: Atrial fibrillation.  History of peritoneal dialysis

EXAM:
CHEST - 2 VIEW

[Series 1: dg chest 2 view · 0.14mm/px · 2 of 2 slices shown]
[im 1/2]
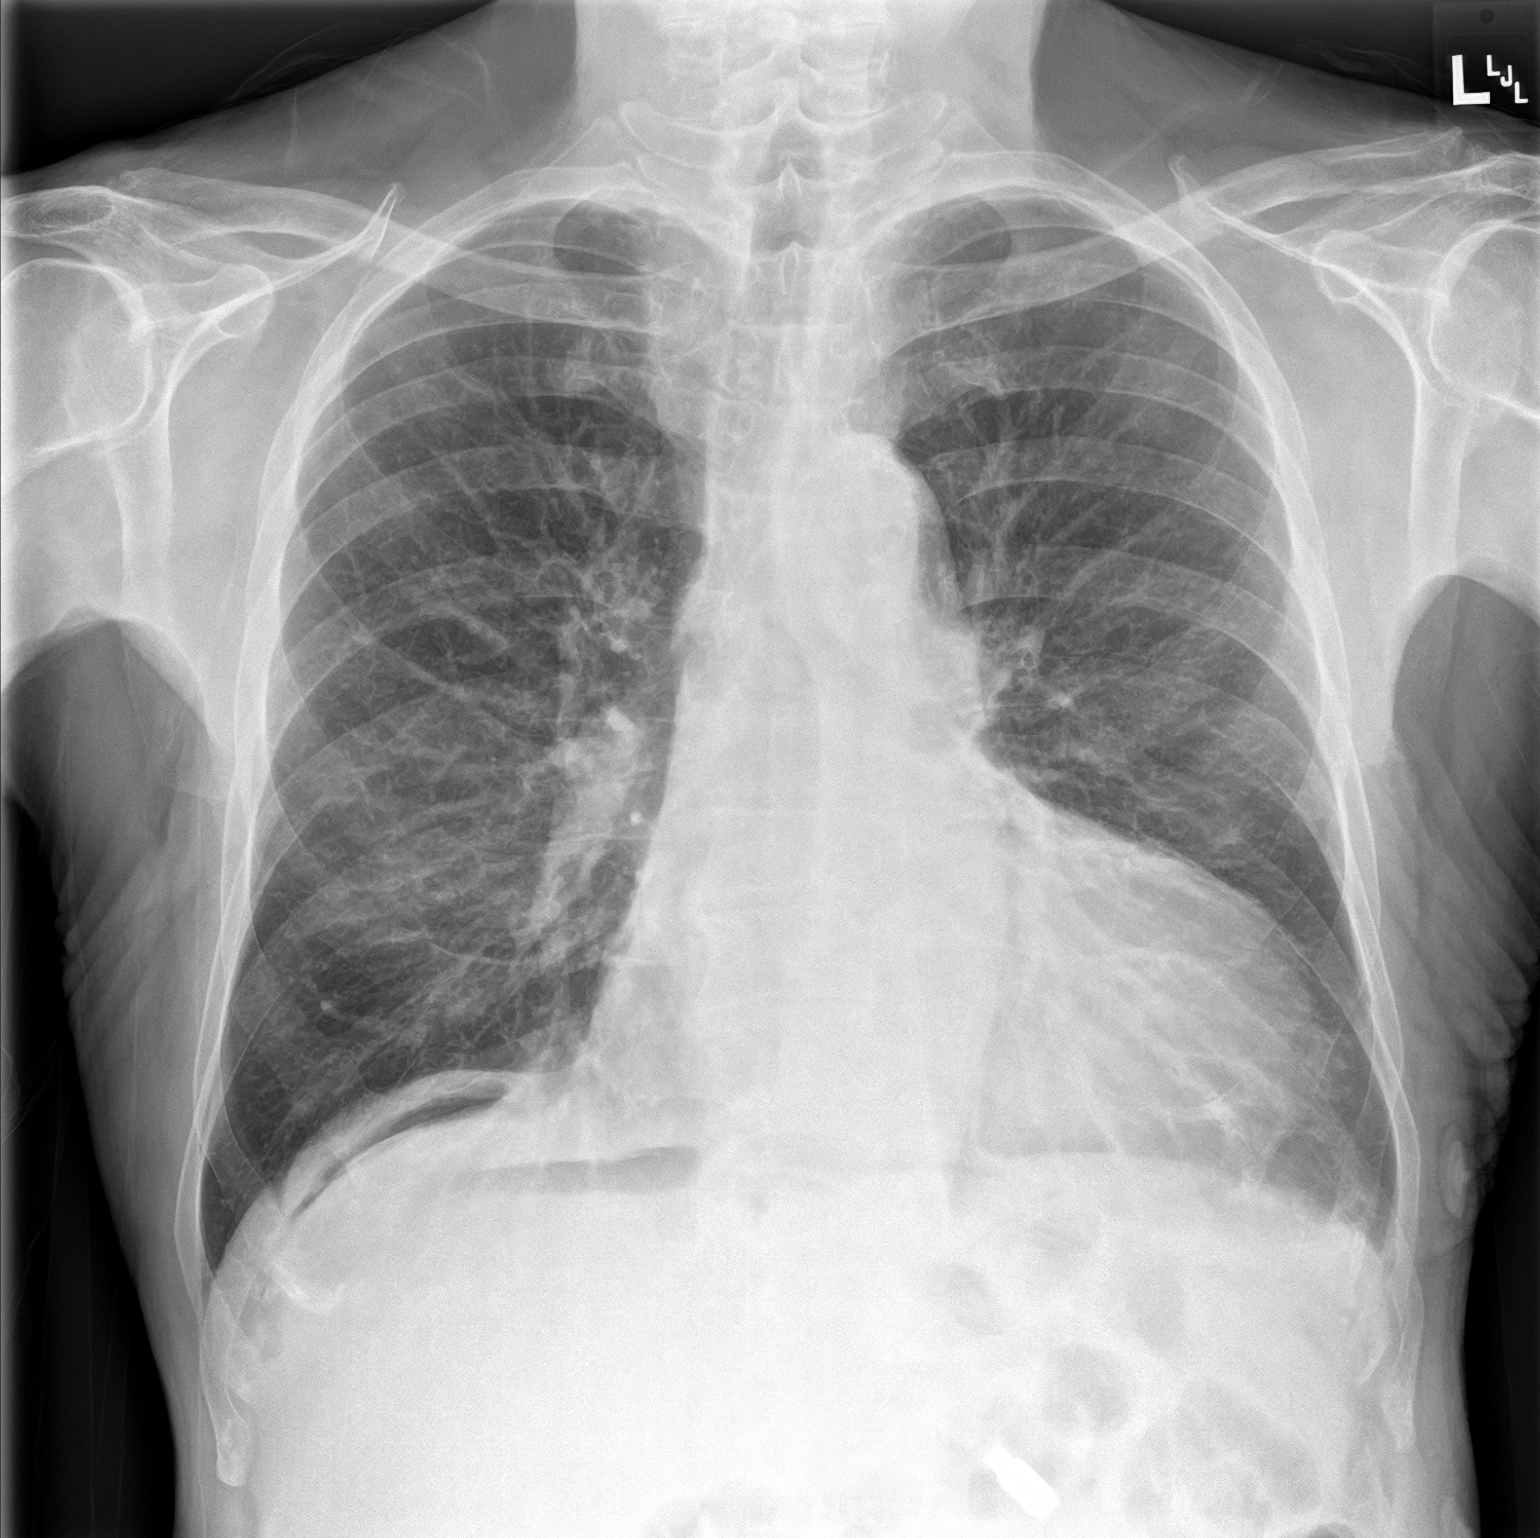
[im 2/2]
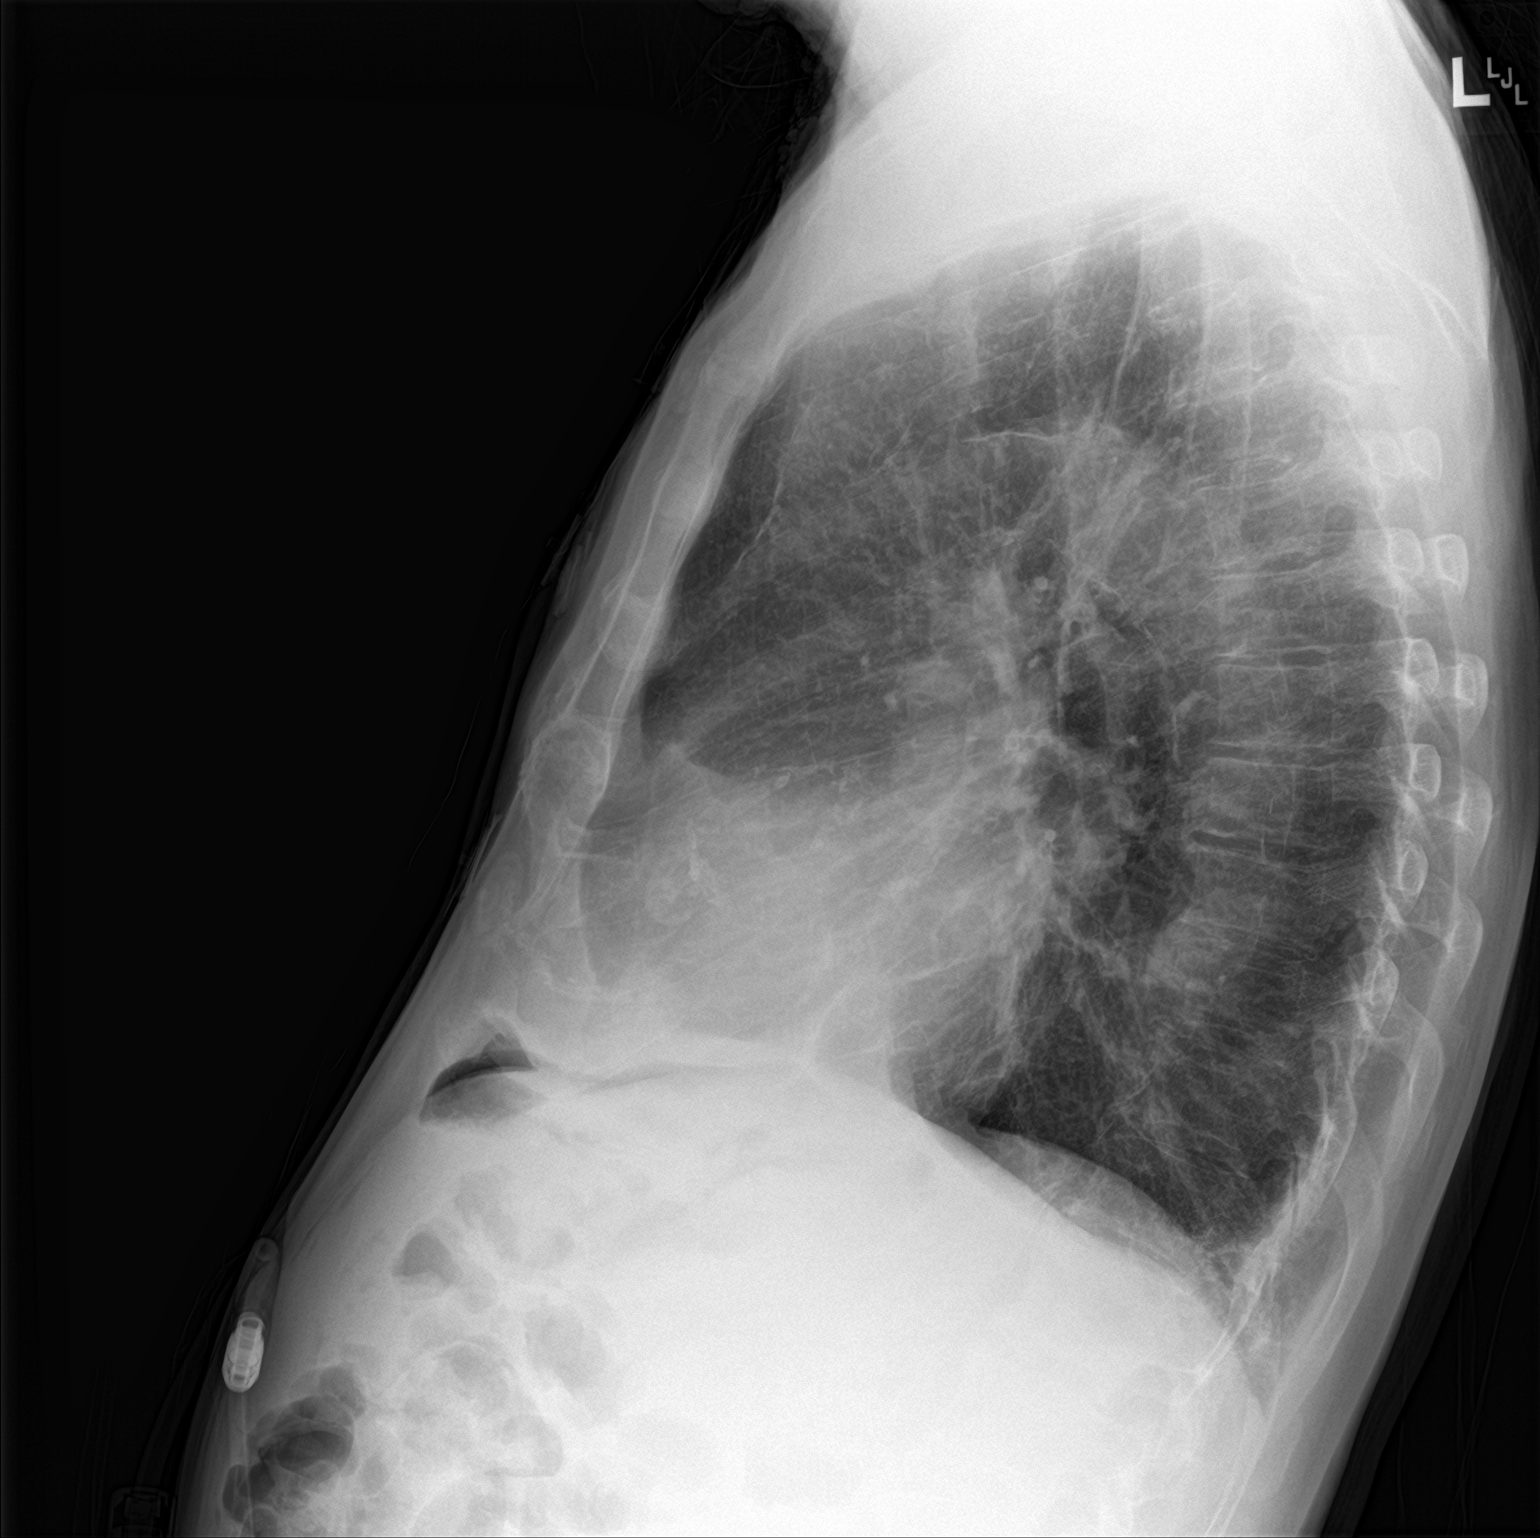

[2 of 2 positions shown; findings below may reference images not displayed]

FINDINGS: The heart size and mediastinal contours are within normal limits.
Aortic atherosclerosis. No focal airspace consolidation, pleural
effusion, or pneumothorax. Small amount of free intraperitoneal air
under the right hemidiaphragm. The visualized skeletal structures
are unremarkable.
IMPRESSION: 1. No active cardiopulmonary disease.
2. Small amount of free intraperitoneal air under the right
hemidiaphragm, likely related to peritoneal dialysis. Appearance is
similar to prior chest x-ray from 11/28/2020. Correlate with
physical exam to exclude the possibility of an acute abdomen.

## 2022-09-13 NOTE — Progress Notes (Signed)
Patient is deceased.

## 2022-09-16 ENCOUNTER — Ambulatory Visit: Payer: Medicare PPO | Admitting: Internal Medicine

## 2022-09-22 NOTE — Progress Notes (Signed)
Called and spoke with patient's wife. She voiced that she was glad to have been able to see her husband today but states that she does not feel the need to see him prior to his passing. "I want to remember him like he was." Mrs. Solar states that she has after death plans in place and states that she is receiving emotional support from her son and her friends. She verbalized appreciation for nurses and doctors on the unit.  Encouraged her to call Palliative Medicine Team with concerns or questions.

## 2022-09-22 NOTE — Progress Notes (Signed)
Late Note Entry  Contacted [facility] home therapy dept to advise staff of pt's passing.   Olivia Canter Renal Navigator [phone]

## 2022-09-22 NOTE — Progress Notes (Addendum)
PROGRESS NOTE        PATIENT DETAILS Name: [name] Age: 77 y.o. Sex: male Date of Birth: [birth year] Admit Date: 08/04/2022 Admitting Physician Alan Mulder, MD ZOX:[id number], Mar Daring, MD  Brief Summary: Patient is a 77 y.o.  male with history of ESRD on peritoneal dialysis, A-fib on Eliquis, HTN, HLD-who was transferred from [facility] for concern for a flail chest following a fall with rib fracture.  Hospital course complicated by intractable vomiting-leading to upper GI bleeding from presumed Mallory-Weiss tear, aspiration pneumonia, severe debility/deconditioning.  See below for further details.  Significant events: 3/25-3/26>> hospitalization at [facility] following a fall-concern for flail chest-transferred to [facility] at [facility] for trauma evaluation. 3/27>> abdominal pain following PD vol fill. Numerous episodes of vomiting evening-into night-vomitus became coffee-ground-some intermittent hypoxia. NPO-PPI infusion started.Renal MD decreasing PD vol fill. 3/31>> Per GI-no EGD-okay to restart anticoagulation.  IV heparin started.  SLP eval-keep n.p.o. 4/01>>Cortrak tube inserted 4/03>> sinus pauses on telemetry-bisoprolol/transdermal clonidine discontinued.  Significant studies: 3/25>> CT chest: Nondisplaced left seventh/eighth rib fracture, possible 12th left rib fracture. 3/25>> CT abdomen/pelvis: Extensive pneumoperitoneum-in the setting of PD catheter.  6.4 cm left cystic lesion-suggestive of a hemorrhagic cyst.  Significant microbiology data: 3/28>> blood cultures: No growth  Procedures: None  Consults: Nephrology Trauma surgery GI Palliative  Subjective: Patient in bed appears extremely weak and frail, states he has generalized body ache, does not feel good, mild cough, short of breath, NG tube hurts in the throat,  Objective: Vitals: Blood pressure 134/70, pulse (!) 114, temperature (!) 97.4 F (36.3 C), temperature source Axillary, resp. rate 17,  height  (1.676 m), weight 66.9 kg, SpO2 97 %.   Exam:  Patient in bed appears somnolent and weak, no focal deficits [place]ket.AT,PERRAL Supple Neck, No JVD,   Symmetrical Chest wall movement, Good air movement bilaterally, CTAB RRR,No Gallops, Rubs or new Murmurs,  +ve B.Sounds, Abd Soft, No tenderness,   No Cyanosis, Clubbing or edema   Assessment/Plan:  Patient has been transition to full comfort measures on Sep 04, 2022.  All noncomfort medications will be stopped.  If survives for the next few days residential hospice, other medical issues addressed earlier this admission are below.     Mechanical fall with left seventh, eighth and possible 12th rib fracture Overall much better-significantly less pain Continue multimodal pain regimen Pulmonary tolerating Mobilization with PT/OT Trauma team has signed off.    Intractable nausea/vomiting Upper GI bleed with coffee-ground emesis-?  Mallory-Weiss tear, some melena.  Eliquis on hold on PPI. Nausea/vomiting/coffee-ground emesis resolved.  Had approximately 10-15 episodes of vomiting 3/27 overnight Have some melena, Eliquis on hold on PPI, wife now leaning towards full comfort measures, currently not a candidate for EGD colonoscopy.  Monitor on PPI.     Acute hypoxic respiratory failure Multifactorial-initially due to rib fractures/splinting-then developed aspiration pneumonia-likely due to pulmonary edema in the setting of ineffective peritoneal dialysis. Overall improved-requiring only minimal amount of oxygen-1-2 L.  Continue to treat underlying etiologies is much as possible Encourage pulmonary tolerating.  Aspiration pneumonia Overall clinically improved-afebrile  Cultures negative so far IV Unasyn x 5 days total completed 4/1 Unfortunately leukocytosis is stable increasing-he looks clinically unchanged-plan is to repeat cultures today-check CT chest.  Due to overall stability-unclear source of infection-hold off on starting  antibiotics unless he is febrile or becomes hemodynamically unstable.  Oropharyngeal dysphagia Unfortunately has developed severe deconditioning-causing oropharyngeal dysphagia-therapy recommended n.p.o. status  Cortrak tube placed on 4/1-hopefully we can get him optimized over the next few days and attempt to rechallenge with oral intake over the next several days.  SLP following.  Left renal hemorrhagic cyst Incidental finding on CT abdomen Discussed with renal MD-no reason to continue holding Eliquis for this issue-as hemoglobin stable.  ESRD on peritoneal dialysis Hyperkalemia Has been switched to HD as SNF discharge plan-unable to do PD at SNF First HD on 4/4-tolerated well Nephrology following.    Acute urinary retention  Occurred on 3/28 when patient had vomiting-extensive pain issues Foley catheter removed on 3/31  Continue to watch for urinary retention.  PAF Sinus rhythm on telemetry Continue amiodarone Eliquis initially on hold-IV heparin started 3/31-without any signs of rebleeding-switch to Eliquis 4/2.    Sinus pauses Occurred overnight on 4/3 Asymptomatic-he was sleeping Beta-blocker/transdermal clonidine discontinued-no further episodes on telemetry-watch closely.    Acute on chronic chronic HFrEF See above Volume removal with dialysis . CAD (LHC 06/30/2022-two-vessel disease-70-80% mid LAD stenosis, chronic total occlusion of RCA with left-right collaterals-optimization of medical therapy recommended) Not on antiplatelets as he is on Eliquis Intolerant to statin Currently without any anginal symptoms-his left-sided musculoskeletal chest pain is from rib fractures.  HTN BP reasonable on amlodipine/losartan/doxazosin Bisoprolol/transdermal clonidine stopped on 4/3 due to sinus pauses Follow.  Debility/deconditioning Likely due to acute illness-has had significant decline in functional status over the past several days PT/OT-now recommending SNF Multiple  issues/setbacks during this hospitalization-now with severe failure to thrive-will ask palliative care to evaluate-remains a full code as of now.  Thankfully seems to have overall stabilized-hopefully HD has begun-he will slowly stabilize over the next several days.  If he fails to improve-we can contemplate another goals of care discussion over the next several days.  Nutrition Status: Nutrition Problem: Moderate Malnutrition Etiology: chronic illness Signs/Symptoms: severe muscle depletion, mild fat depletion Interventions: Tube feeding, MVI    BMI: Estimated body mass index is 23.81 kg/m as calculated from the following:   Height as of this encounter: 5\' 6"  (1.676 m).   Weight as of this encounter: 66.9 kg.   Code status:   Code Status: DNR   DVT Prophylaxis: Place TED hose Start: 08/11/2022 2148    Family Communication: Andreaz Kalmbach [phone] 09/02/2022, 09/18/2022- DNR, Full comfort care   Disposition Plan: Status is: Inpatient Remains inpatient appropriate because: Severity of illness   Planned Discharge Destination:SNF   Diet: Diet Order             Diet NPO time specified  Diet effective now                     Antimicrobial agents: Anti-infectives (From admission, onward)    Start     Dose/Rate Route Frequency Ordered Stop   09/05/2022 2345  vancomycin (VANCOREADY) IVPB 1500 mg/300 mL        1,500 mg 150 mL/hr over 120 Minutes Intravenous  Once 09/05/2022 2251 09/14/2022 0423   08/24/22 1505  ceFAZolin (ANCEF) IVPB 2g/100 mL premix        over 30 Minutes Intravenous Continuous PRN 08/24/22 1538 08/24/22 1505   08/24/22 1315  ceFAZolin (ANCEF) IVPB 2g/100 mL premix        2 g 200 mL/hr over 30 Minutes Intravenous To Radiology 08/24/22 1225 08/25/22 1315   08/19/22 1100  Ampicillin-Sulbactam (UNASYN) 3 g in sodium chloride 0.9 % 100 mL IVPB  3 g 200 mL/hr over 30 Minutes Intravenous Every 24 hours 08/19/22 0957 08/23/22 1141         MEDICATIONS: Scheduled Meds:  Chlorhexidine Gluconate Cloth  6 each Topical Q0600   doxazosin  8 mg Oral Daily   gentamicin cream  1 Application Topical Daily   nicotine  21 mg Transdermal Daily   pantoprazole  40 mg Intravenous Q12H   sennosides  15 mL Oral BID   Continuous Infusions:  HYDROmorphone     promethazine (PHENERGAN) injection (IM or IVPB)     PRN Meds:.acetaminophen, albuterol, antiseptic oral rinse, bisacodyl, HYDROmorphone, lactulose, LORazepam **OR** LORazepam **OR** LORazepam, [DISCONTINUED] ondansetron **OR** ondansetron (ZOFRAN) IV, polyvinyl alcohol, promethazine (PHENERGAN) injection (IM or IVPB), [DISCONTINUED] promethazine **OR** [DISCONTINUED] promethazine (PHENERGAN) injection (IM or IVPB) **OR** promethazine   I have personally reviewed following labs and imaging studies  LABORATORY DATA:  Recent Labs  Lab 08/24/22 0952 08/25/22 0953 08/28/22 1558 08/28/22 1758 08/28/22 2111 Sep 02, 2022 0752 09/02/2022 0444  WBC 10.7*   < > 16.8* 18.2* 17.8* 18.8* 18.1*  HGB 9.5*   < > 8.8* 8.6* 8.4* 8.8* 8.2*  HCT 28.4*   < > 27.1* 27.3* 26.1* 28.2* 25.8*  PLT 200   < > 154 151 144* 161 130*  MCV 86.6   < > 88.3 89.8 88.2 90.7 89.3  MCH 29.0   < > 28.7 28.3 28.4 28.3 28.4  MCHC 33.5   < > 32.5 31.5 32.2 31.2 31.8  RDW 16.0*   < > 15.4 15.4 15.3 15.2 15.2  LYMPHSABS 1.0  --   --   --   --  1.1 1.0  MONOABS 0.8  --   --   --   --  0.9 0.8  EOSABS 0.4  --   --   --   --  0.4 0.5  BASOSABS 0.0  --   --   --   --  0.0 0.0   < > = values in this interval not displayed.    Recent Labs  Lab 08/23/22 2145 08/24/22 0952 08/24/22 0952 08/24/22 1653 08/25/22 0953 08/25/22 0954 08/26/22 0745 08/27/22 0825 08/28/22 0317 02-Sep-2022 0752 09/21/2022 0444  NA  --  137   < >  --   --  138 138 135 135 140  --   K  --  4.0   < >  --   --  3.7 4.2 4.2 4.5 4.5  --   CL  --  101   < >  --   --  99 97* 99 97* 102  --   CO2  --  25   < >  --   --  25 25 27 25 27   --    ANIONGAP  --  11   < >  --   --  14 16* 9 13 11   --   GLUCOSE  --  109*   < >  --   --  153* 194* 212* 193* 105*  --   BUN  --  76*   < >  --   --  77* 89* 50* 68* 41*  --   CREATININE  --  8.05*   < >  --   --  8.19* 8.53* 5.08* 5.90* 3.75*  --   ALBUMIN  --  <1.5*   < >  --   --  <1.5* <1.5* <1.5* <1.5* <1.5*  --   CRP  --   --   --   --   --   --   --   --   --  13.9* 12.5*  HGBA1C  --   --   --   --  6.1*  --   --   --   --   --   --   BNP  --   --   --   --   --   --   --   --   --  1,677.5* 1,154.7*  MG 2.0 1.9  --  2.0  --  2.0  --   --   --   --   --   CALCIUM  --  7.9*   < >  --   --  8.1* 8.3* 8.3* 8.5* 8.6*  --    < > = values in this interval not displayed.    RADIOLOGY STUDIES/RESULTS: DG Chest Port 1 View  Result Date: 09/02/2022 CLINICAL DATA:  [id number] with shortness of breath. EXAM: PORTABLE CHEST 1 VIEW COMPARISON:  Chest CT without contrast earlier today FINDINGS: 5:53 a.m. Dobbhoff feeding tube enters the stomach with the full intragastric course not filmed. There is a right IJ dialysis catheter terminating in the upper right atrium, as before. Stable moderate cardiomegaly. There is perihilar vascular congestion and mild interstitial edema. There are bilateral layering pleural effusions, with overlying atelectatic haziness in the bases. No focal pneumonia is evident. The aorta is tortuous and calcified with stable mediastinum. Osteopenia. Compare: Overall aeration is unchanged.  No new abnormality. IMPRESSION: 1. Cardiomegaly with perihilar vascular congestion and mild interstitial edema. 2. Layering bilateral pleural effusions. No focal pneumonia. 3. Support apparatus stable. 4. Aortic atherosclerosis. Electronically Signed   By: Almira Bar M.D.   On: 08/31/2022 07:13   CT CHEST WO CONTRAST  Result Date: 08/31/2022 CLINICAL DATA:  Evaluate for possible effusion EXAM: CT CHEST WITHOUT CONTRAST TECHNIQUE: Multidetector CT imaging of the chest was performed following the  standard protocol without IV contrast. RADIATION DOSE REDUCTION: This exam was performed according to the departmental dose-optimization program which includes automated exposure control, adjustment of the mA and/or kV according to patient size and/or use of iterative reconstruction technique. COMPARISON:  Chest x-ray from the previous day as well as chest and abdomen CT from 08/16/2022 FINDINGS: Cardiovascular: Somewhat limited due to the lack of IV contrast. Right jugular dialysis catheter is noted. Extensive atherosclerotic calcifications of the thoracic aorta are noted. No aneurysmal dilatation is seen. The heart is enlarged in size. Coronary calcifications are noted. Mediastinum/Nodes: Thoracic inlet is within normal limits. No sizable hilar or mediastinal adenopathy is noted. Feeding catheter is seen extending into the stomach. The esophagus as visualized is within normal limits. Lungs/Pleura: Bilateral pleural effusions are noted right greater than left with associated lower lobe consolidation. Emphysematous changes are seen. No parenchymal nodules are seen. No pneumothorax is noted. Upper Abdomen: Visualized upper abdomen again demonstrates multi cystic changes of the kidneys incompletely evaluated on this noncontrast study. The overall appearance however is stable from the prior CT. Musculoskeletal: No chest wall mass or suspicious bone lesions identified. IMPRESSION: Bilateral pleural effusions with associated consolidation right greater than left. These changes have increased in the interval from the prior exam. Stable cystic changes of the kidneys in the upper abdomen. Tubes and lines as described above. Aortic Atherosclerosis (ICD10-I70.0) and Emphysema (ICD10-J43.9). Electronically Signed   By: Alcide Clever M.D.   On: 09/20/2022 01:24     LOS: 13 days   Signature  -    Susa Raring M.D on 09/10/2022 at 9:43 AM   -  To page go  to [email or web address]

## 2022-09-22 NOTE — Death Summary Note (Addendum)
[facility] Death Note                                                                                                                                                                                               [name], is a 77 y.o. male, DOB - [birth year], YQM:[id number]  Admit date - 27-Brett-2024   Admitting Physician Brett Mulder, MD  Outpatient Primary MD for the patient is Brett Shams, MD  LOS - 13  No chief complaint on file.      Notification: Brett Shams, MD notified of death of 08-31-22   Admit Date:  08/18/22  Date of Death: Date of Death: August 31, 2022  Time of Death: Time of Death: Sep 08, 1612  Length of Stay: 13   Pronounced by - RN  History of present illness:   [name] is a 77 y.o. male with a history of -  77 y.o.  male with history of ESRD on peritoneal dialysis, A-fib on Eliquis, HTN, HLD-who was transferred from [facility] for concern for a flail chest following a fall with rib fracture.  Hospital course complicated by intractable vomiting-leading to upper GI bleeding from presumed Mallory-Weiss tear, aspiration pneumonia, severe debility/deconditioning, despite full Rx he continued to decline, upon wife's request transitioned to full comfort care.   Final Diagnoses:  Cause if death - Aspiration Pneumonia  Signature  -    [name] M.D on August 31, 2022 at 4:25 PM   -  To page go to [email or web address]   Total clinical and documentation time for today Under 30 minutes   Last Note                          PROGRESS NOTE        PATIENT DETAILS Name: [name] Age: 77 y.o. Sex: male Date of Birth: 04-25-1946 Admit Date: 08-18-22 Admitting Physician Brett Mulder, MD GQB:VQXIH, Brett Daring, MD  Brief Summary: Patient is a 77 y.o.  male with history of ESRD on peritoneal dialysis, A-fib on Eliquis, HTN, HLD-who was transferred from [facility] for concern for a flail  chest following a fall with rib fracture.  Hospital course complicated by intractable vomiting-leading to upper GI bleeding from presumed Mallory-Weiss tear, aspiration pneumonia, severe debility/deconditioning.  See below for further details.  Significant events: 3/25-3/26>> hospitalization at [facility] following a fall-concern for flail chest-transferred to [facility] at [facility] for trauma evaluation. 3/27>> abdominal pain following PD vol fill. Numerous episodes of vomiting evening-into night-vomitus became coffee-ground-some intermittent hypoxia. NPO-PPI infusion started.Renal MD decreasing PD vol fill. 3/31>> Per GI-no EGD-okay to  restart anticoagulation.  IV heparin started.  SLP eval-keep n.p.o. 4/01>>Cortrak tube inserted 4/03>> sinus pauses on telemetry-bisoprolol/transdermal clonidine discontinued.  Significant studies: 3/25>> CT chest: Nondisplaced left seventh/eighth rib fracture, possible 12th left rib fracture. 3/25>> CT abdomen/pelvis: Extensive pneumoperitoneum-in the setting of PD catheter.  6.4 cm left cystic lesion-suggestive of a hemorrhagic cyst.  Significant microbiology data: 3/28>> blood cultures: No growth  Procedures: None  Consults: Nephrology Trauma surgery GI Palliative  Subjective: Patient in bed appears extremely weak and frail, states he has generalized body ache, does not feel good, mild cough, short of breath, NG tube hurts in the throat,  Objective: Vitals: Blood pressure 134/70, pulse (!) 114, temperature (!) 97.4 F (36.3 C), temperature source Axillary, resp. rate 17, height 5\' 6"  (1.676 m), weight 66.9 kg, SpO2 97 %.   Exam:  Patient in bed appears somnolent and weak, no focal deficits [name].AT,PERRAL Supple Neck, No JVD,   Symmetrical Chest wall movement, Good air movement bilaterally, CTAB RRR,No Gallops, Rubs or new Murmurs,  +ve B.Sounds, Abd Soft, No tenderness,   No Cyanosis, Clubbing or edema   Assessment/Plan:  Patient has been transition to  full comfort measures on 09/01/2022.  All non comfort medications will be stopped.  If survives for the next few days residential hospice, other medical issues addressed earlier this admission are below.     Mechanical fall with left seventh, eighth and possible 12th rib fracture Overall much better-significantly less pain Continue multimodal pain regimen Pulmonary tolerating Mobilization with PT/OT Trauma team has signed off.    Intractable nausea/vomiting Upper GI bleed with coffee-ground emesis-?  Mallory-Weiss tear, some melena.  Eliquis on hold on PPI. Nausea/vomiting/coffee-ground emesis resolved.  Had approximately 10-15 episodes of vomiting 3/27 overnight Have some melena, Eliquis on hold on PPI, wife now leaning towards full comfort measures, currently not a candidate for EGD colonoscopy.  Monitor on PPI.     Acute hypoxic respiratory failure Multifactorial-initially due to rib fractures/splinting-then developed aspiration pneumonia-likely due to pulmonary edema in the setting of ineffective peritoneal dialysis. Overall improved-requiring only minimal amount of oxygen-1-2 L.  Continue to treat underlying etiologies is much as possible Encourage pulmonary tolerating.  Aspiration pneumonia Overall clinically improved-afebrile  Cultures negative so far IV Unasyn x 5 days total completed 4/1 Unfortunately leukocytosis is stable increasing-he looks clinically unchanged-plan is to repeat cultures today-check CT chest.  Due to overall stability-unclear source of infection-hold off on starting antibiotics unless he is febrile or becomes hemodynamically unstable.    Oropharyngeal dysphagia Unfortunately has developed severe deconditioning-causing oropharyngeal dysphagia-therapy recommended n.p.o. status  Cortrak tube placed on 4/1-hopefully we can get him optimized over the next few days and attempt to rechallenge with oral intake over the next several days.  SLP following.  Left renal  hemorrhagic cyst Incidental finding on CT abdomen Discussed with renal MD-no reason to continue holding Eliquis for this issue-as hemoglobin stable.  ESRD on peritoneal dialysis Hyperkalemia Has been switched to HD as SNF discharge plan-unable to do PD at SNF First HD on 4/4-tolerated well Nephrology following.    Acute urinary retention  Occurred on 3/28 when patient had vomiting-extensive pain issues Foley catheter removed on 3/31  Continue to watch for urinary retention.  PAF Sinus rhythm on telemetry Continue amiodarone Eliquis initially on hold-IV heparin started 3/31-without any signs of rebleeding-switch to Eliquis 4/2.    Sinus pauses Occurred overnight on 4/3 Asymptomatic-he was sleeping Beta-blocker/transdermal clonidine discontinued-no further episodes on telemetry-watch closely.    Acute on chronic  chronic HFrEF See above Volume removal with dialysis . CAD (LHC 06/30/2022-two-vessel disease-70-80% mid LAD stenosis, chronic total occlusion of RCA with left-right collaterals-optimization of medical therapy recommended) Not on antiplatelets as he is on Eliquis Intolerant to statin Currently without any anginal symptoms-his left-sided musculoskeletal chest pain is from rib fractures.  HTN BP reasonable on amlodipine/losartan/doxazosin Bisoprolol/transdermal clonidine stopped on 4/3 due to sinus pauses Follow.  Debility/deconditioning Likely due to acute illness-has had significant decline in functional status over the past several days PT/OT-now recommending SNF Multiple issues/setbacks during this hospitalization-now with severe failure to thrive-will ask palliative care to evaluate-remains a full code as of now.  Thankfully seems to have overall stabilized-hopefully HD has begun-he will slowly stabilize over the next several days.  If he fails to improve-we can contemplate another goals of care discussion over the next several days.  Nutrition Status: Nutrition  Problem: Moderate Malnutrition Etiology: chronic illness Signs/Symptoms: severe muscle depletion, mild fat depletion Interventions: Tube feeding, MVI    BMI: Estimated body mass index is 23.81 kg/m as calculated from the following:   Height as of this encounter: 5\' 6"  (1.676 m).   Weight as of this encounter: 66.9 kg.   Code status:   Code Status: DNR   DVT Prophylaxis: Place TED hose Start: 07/26/2022 2148    Family Communication: Tharon Bomar [phone] 09/10/2022, September 13, 2022- DNR, Full comfort care   Disposition Plan: Status is: Inpatient Remains inpatient appropriate because: Severity of illness   Planned Discharge Destination:SNF   Diet: Diet Order             Diet NPO time specified  Diet effective now                     Antimicrobial agents: Anti-infectives (From admission, onward)    Start     Dose/Rate Route Frequency Ordered Stop   08/23/2022 2345  vancomycin (VANCOREADY) IVPB 1500 mg/300 mL        1,500 mg 150 mL/hr over 120 Minutes Intravenous  Once 09/09/2022 2251 09/13/22 0423   08/24/22 1505  ceFAZolin (ANCEF) IVPB 2g/100 mL premix        over 30 Minutes Intravenous Continuous PRN 08/24/22 1538 08/24/22 1505   08/24/22 1315  ceFAZolin (ANCEF) IVPB 2g/100 mL premix        2 g 200 mL/hr over 30 Minutes Intravenous To Radiology 08/24/22 1225 08/25/22 1315   08/19/22 1100  Ampicillin-Sulbactam (UNASYN) 3 g in sodium chloride 0.9 % 100 mL IVPB        3 g 200 mL/hr over 30 Minutes Intravenous Every 24 hours 08/19/22 0957 08/23/22 1141        MEDICATIONS: Scheduled Meds:  Chlorhexidine Gluconate Cloth  6 each Topical Q0600   doxazosin  8 mg Oral Daily   gentamicin cream  1 Application Topical Daily   nicotine  21 mg Transdermal Daily   pantoprazole  40 mg Intravenous Q12H   sennosides  15 mL Oral BID   Continuous Infusions:  HYDROmorphone Stopped (09-13-2022 1624)   promethazine (PHENERGAN) injection (IM or IVPB)     PRN  Meds:.acetaminophen, albuterol, antiseptic oral rinse, bisacodyl, HYDROmorphone, lactulose, LORazepam **OR** LORazepam **OR** LORazepam, [DISCONTINUED] ondansetron **OR** ondansetron (ZOFRAN) IV, polyvinyl alcohol, promethazine (PHENERGAN) injection (IM or IVPB), [DISCONTINUED] promethazine **OR** [DISCONTINUED] promethazine (PHENERGAN) injection (IM or IVPB) **OR** promethazine   I have personally reviewed following labs and imaging studies  LABORATORY DATA:  Recent Labs  Lab 08/24/22 0952 08/25/22 0953 08/28/22 1558 08/28/22 1758  08/28/22 2111 09/08/2022 0752 Sep 17, 2022 0444  WBC 10.7*   < > 16.8* 18.2* 17.8* 18.8* 18.1*  HGB 9.5*   < > 8.8* 8.6* 8.4* 8.8* 8.2*  HCT 28.4*   < > 27.1* 27.3* 26.1* 28.2* 25.8*  PLT 200   < > 154 151 144* 161 130*  MCV 86.6   < > 88.3 89.8 88.2 90.7 89.3  MCH 29.0   < > 28.7 28.3 28.4 28.3 28.4  MCHC 33.5   < > 32.5 31.5 32.2 31.2 31.8  RDW 16.0*   < > 15.4 15.4 15.3 15.2 15.2  LYMPHSABS 1.0  --   --   --   --  1.1 1.0  MONOABS 0.8  --   --   --   --  0.9 0.8  EOSABS 0.4  --   --   --   --  0.4 0.5  BASOSABS 0.0  --   --   --   --  0.0 0.0   < > = values in this interval not displayed.    Recent Labs  Lab 08/23/22 2145 08/24/22 0952 08/24/22 0952 08/24/22 1653 08/25/22 0953 08/25/22 0954 08/26/22 0745 08/27/22 0825 08/28/22 0317 09/17/2022 0752 2022-09-17 0444  NA  --  137   < >  --   --  138 138 135 135 140  --   K  --  4.0   < >  --   --  3.7 4.2 4.2 4.5 4.5  --   CL  --  101   < >  --   --  99 97* 99 97* 102  --   CO2  --  25   < >  --   --  --   ANIONGAP  --  11   < >  --   --  14 16* --   GLUCOSE  --  109*   < >  --   --  153* 194* 212* 193* 105*  --   BUN  --  76*   < >  --   --  77* 89* 50* 68* 41*  --   CREATININE  --  8.05*   < >  --   --  8.19* 8.53* 5.08* 5.90* 3.75*  --   ALBUMIN  --  <1.5*   < >  --   --  <1.5* <1.5* <1.5* <1.5* <1.5*  --   CRP  --   --   --   --   --   --   --   --   --  13.9* 12.5*   HGBA1C  --   --   --   --  6.1*  --   --   --   --   --   --   BNP  --   --   --   --   --   --   --   --   --  1,677.5* 1,154.7*  MG 2.0 1.9  --  2.0  --  2.0  --   --   --   --   --   CALCIUM  --  7.9*   < >  --   --  8.1* 8.3* 8.3* 8.5* 8.6*  --    < > = values in this interval not displayed.    RADIOLOGY STUDIES/RESULTS: DG Chest Port 1 View  Result Date: 09/20/2022 CLINICAL DATA:  [id number] with shortness of breath. EXAM: PORTABLE CHEST 1 VIEW COMPARISON:  Chest CT without contrast earlier today FINDINGS: 5:53 a.m. Dobbhoff feeding tube enters the stomach with the full intragastric course not filmed. There is a right IJ dialysis catheter terminating in the upper right atrium, as before. Stable moderate cardiomegaly. There is perihilar vascular congestion and mild interstitial edema. There are bilateral layering pleural effusions, with overlying atelectatic haziness in the bases. No focal pneumonia is evident. The aorta is tortuous and calcified with stable mediastinum. Osteopenia. Compare: Overall aeration is unchanged.  No new abnormality. IMPRESSION: 1. Cardiomegaly with perihilar vascular congestion and mild interstitial edema. 2. Layering bilateral pleural effusions. No focal pneumonia. 3. Support apparatus stable. 4. Aortic atherosclerosis. Electronically Signed   By: Almira Bar M.D.   On: 08/25/2022 07:13   CT CHEST WO CONTRAST  Result Date: 09/05/2022 CLINICAL DATA:  Evaluate for possible effusion EXAM: CT CHEST WITHOUT CONTRAST TECHNIQUE: Multidetector CT imaging of the chest was performed following the standard protocol without IV contrast. RADIATION DOSE REDUCTION: This exam was performed according to the departmental dose-optimization program which includes automated exposure control, adjustment of the mA and/or kV according to patient size and/or use of iterative reconstruction technique. COMPARISON:  Chest x-ray from the previous day as well as chest and abdomen CT from 08/16/2022  FINDINGS: Cardiovascular: Somewhat limited due to the lack of IV contrast. Right jugular dialysis catheter is noted. Extensive atherosclerotic calcifications of the thoracic aorta are noted. No aneurysmal dilatation is seen. The heart is enlarged in size. Coronary calcifications are noted. Mediastinum/Nodes: Thoracic inlet is within normal limits. No sizable hilar or mediastinal adenopathy is noted. Feeding catheter is seen extending into the stomach. The esophagus as visualized is within normal limits. Lungs/Pleura: Bilateral pleural effusions are noted right greater than left with associated lower lobe consolidation. Emphysematous changes are seen. No parenchymal nodules are seen. No pneumothorax is noted. Upper Abdomen: Visualized upper abdomen again demonstrates multi cystic changes of the kidneys incompletely evaluated on this noncontrast study. The overall appearance however is stable from the prior CT. Musculoskeletal: No chest wall mass or suspicious bone lesions identified. IMPRESSION: Bilateral pleural effusions with associated consolidation right greater than left. These changes have increased in the interval from the prior exam. Stable cystic changes of the kidneys in the upper abdomen. Tubes and lines as described above. Aortic Atherosclerosis (ICD10-I70.0) and Emphysema (ICD10-J43.9). Electronically Signed   By: Alcide Clever M.D.   On: 09/16/2022 01:24     LOS: 13 days   Signature  -    [name] M.D on 09/19/2022 at 4:25 PM   -  To page go to [email or web address]

## 2022-09-22 NOTE — Progress Notes (Signed)
Patient was being followed by nephrology but I see he is comfort care now.  Please don't hesitate to reach out if I can be of assistance but I don't plan to continue to follow.    Estill Bakes MD [facility] Pager [phone]

## 2022-09-22 NOTE — Progress Notes (Signed)
Speech Language Pathology Treatment: Dysphagia  Patient Details Name: [name] MRN: [id number] DOB: 03-Aug-1945 Today's Date: 09/04/2022 Time: 9458-5929 SLP Time Calculation (min) (ACUTE ONLY): 12 min  Assessment / Plan / Recommendation Clinical Impression  Spoke with [name] at bedside. She has decided to transition her husband to comfort care.  He continues with thick secretions; provided oral care and oral suctioning, removing copious secretions from pharynx.  We discussed continuing to allow sips of water and ice chips should he have moments of lucidity and request them. Provided listening and support.  Our service will respectfully sign off. D/W RN.   HPI HPI: Pt is a 77 y.o. male who presented after a fall. Pt with nondisplaced L 7th and 8th rib fractures. GI consulted due to coffee-ground emesis, and nausea. Hematemesis thought to be due to Mallory-Weiss tear which resolved and EGD thought not be needed and GI signed off 3/31. SLP consulted 3/31 due to RN's report of pt having difficulty initiating the swallow and coughing thereafter. Pt was on full liquids since 3/29, but has been cleared for advancement by [name]. PMH: CHF, A-fib, ESRD on peritoneal dialysis, hypertension, hyperlipidemia, CVA.      SLP Plan  Discharge SLP treatment due to (comment)      Recommendations for follow up therapy are one component of a multi-disciplinary discharge planning process, led by the attending physician.  Recommendations may be updated based on patient status, additional functional criteria and insurance authorization.    Recommendations  Diet recommendations:  (ice chips when alert)                              Discharge SLP treatment due to (comment) comfort care     Merrit Waugh, Sydnee Cabal L. Samson Frederic, [place] CCC/SLP Clinical Specialist - Acute Care SLP Acute Rehabilitation Services Office number [phone]  09/20/2022, 11:43 AM

## 2022-09-22 NOTE — Progress Notes (Signed)
Nutrition Brief Note  Chart reviewed. Pt has now transitioning to comfort care. Tube feeds have been discontinued at this time.    No further nutrition interventions planned at this time. Please re-consult as needed.   Kirby Crigler RD, LDN Clinical Dietitian See Loretha Stapler for contact information.

## 2022-09-22 DEATH — deceased

## 2022-09-28 IMAGING — CT CT HEAD W/O CM
4 series · 16 of 47 positions shown, 18 images · non-contrast
Comparison: 01/14/2020

CLINICAL DATA: Altered mental status



[Series 2: head wo · axial · 0.45mm/px · z∈[-90,+30]mm · 7 of 34 slices shown, 9 images]
[im 5/34  brain]
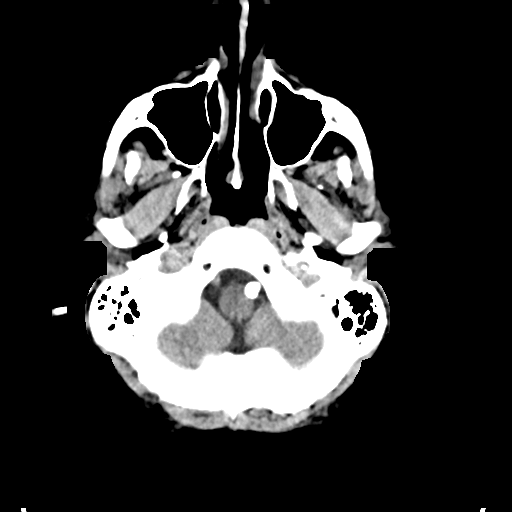
[im 5/34  bone]
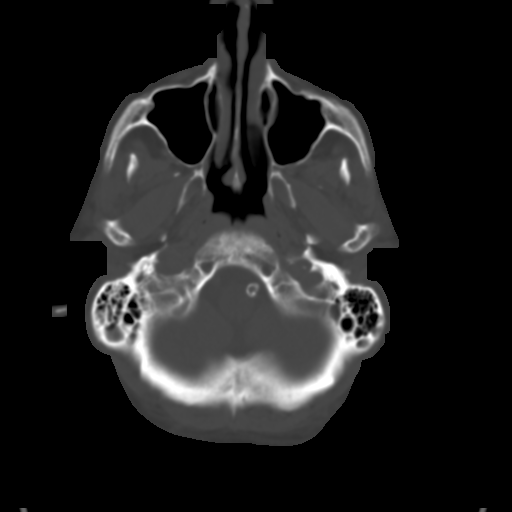
[im 9/34  brain]
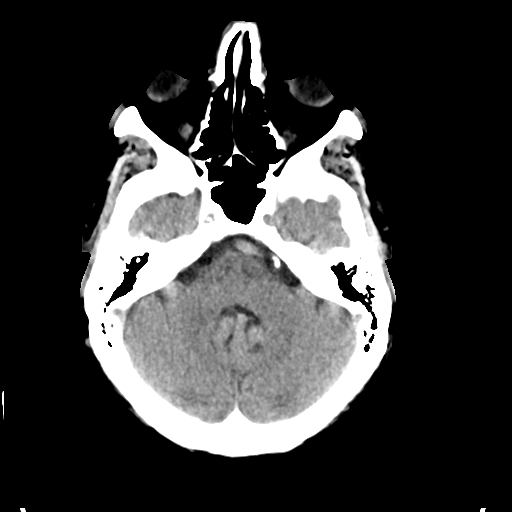
[im 13/34  brain]
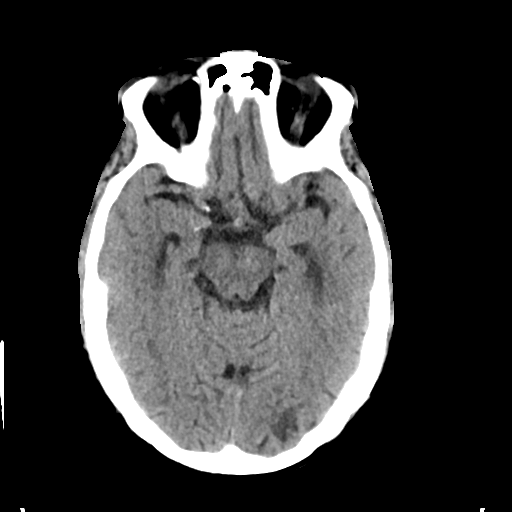
[im 17/34  brain]
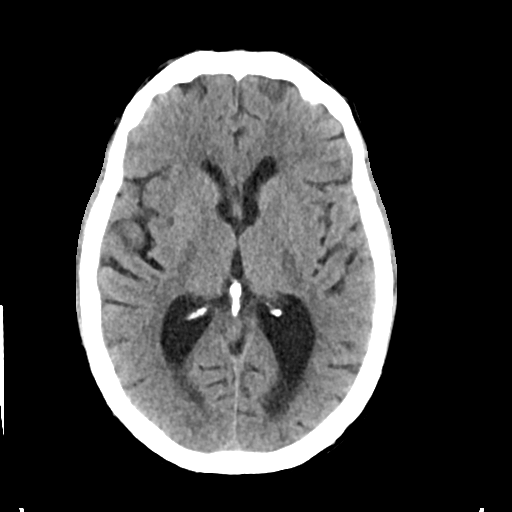
[im 21/34  brain]
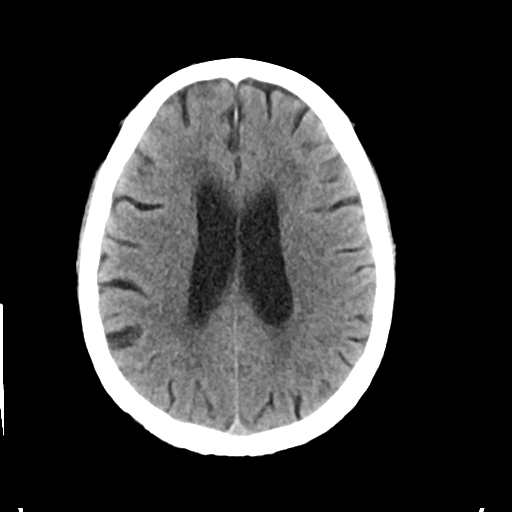
[im 21/34  bone]
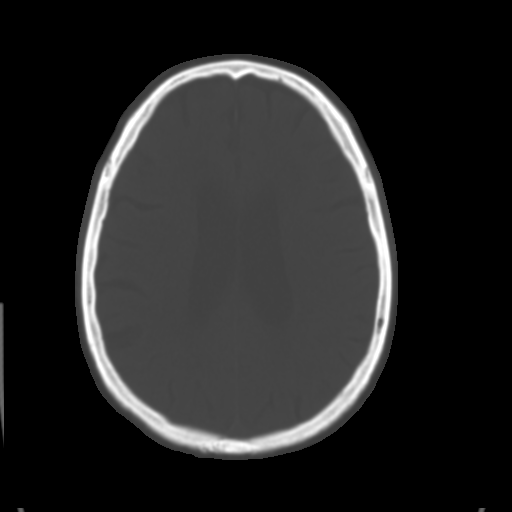
[im 25/34  brain]
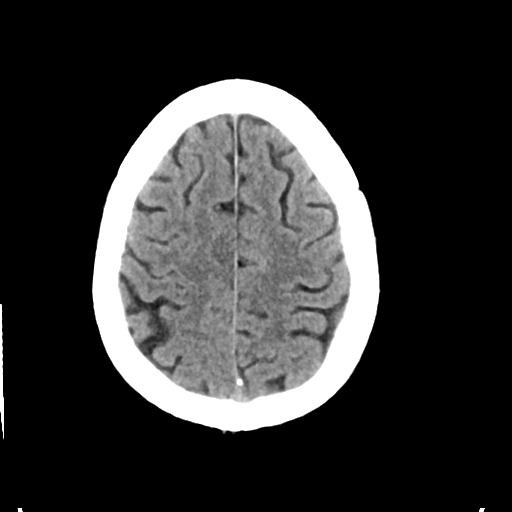
[im 29/34  brain]
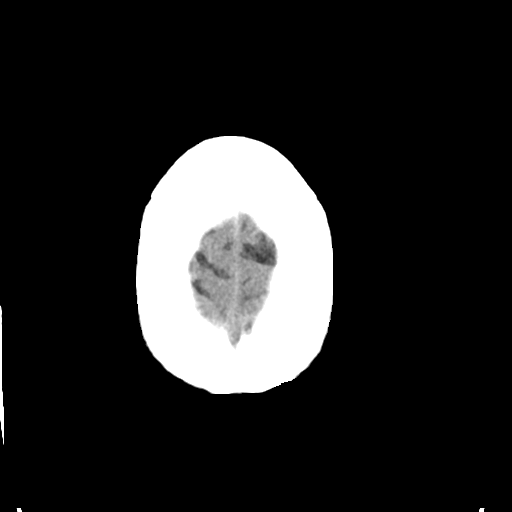

[Series 3: head bone · axial · 0.45mm/px · z∈[-94,-60]mm · 3 of 85 slices shown]
[im 9/85  bone]
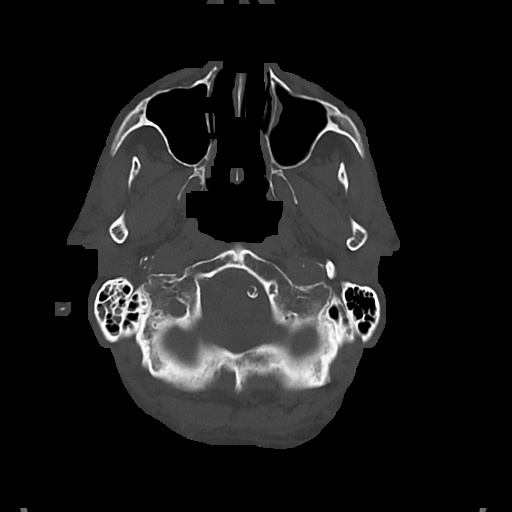
[im 17/85  bone]
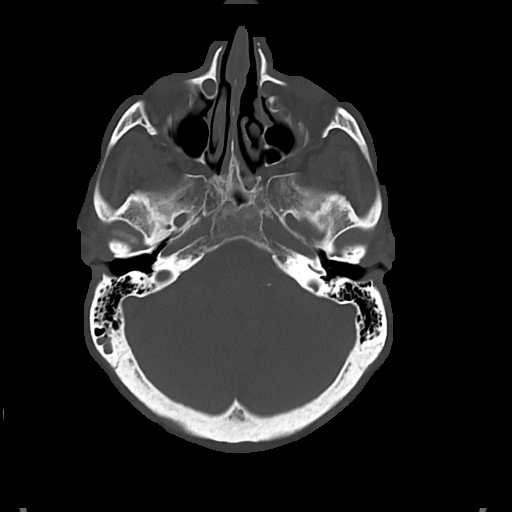
[im 26/85  bone]
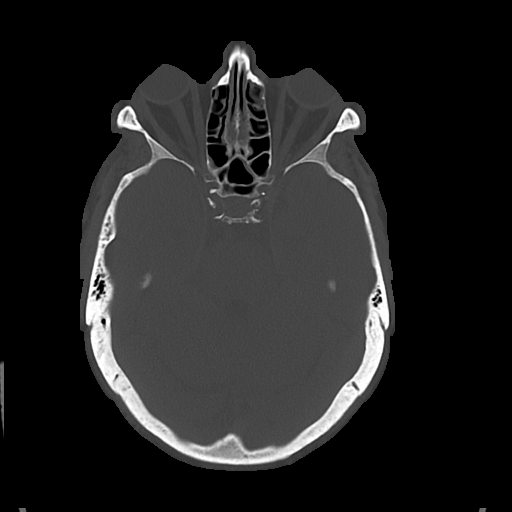

[Series 4: cor soft · coronal · 0.35mm/px · 3 of 76 slices shown]
[im 26/76  brain]
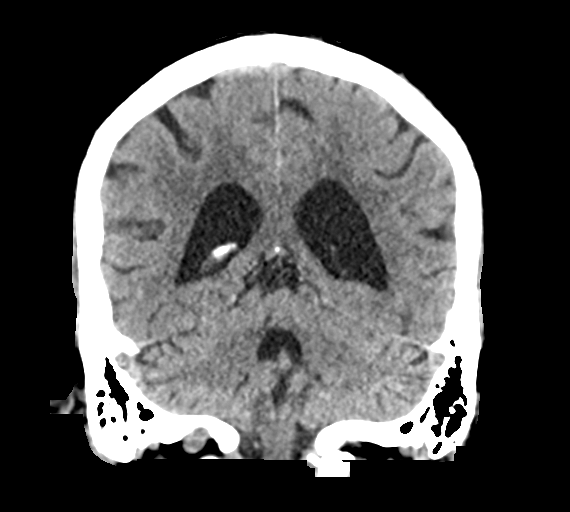
[im 34/76  brain]
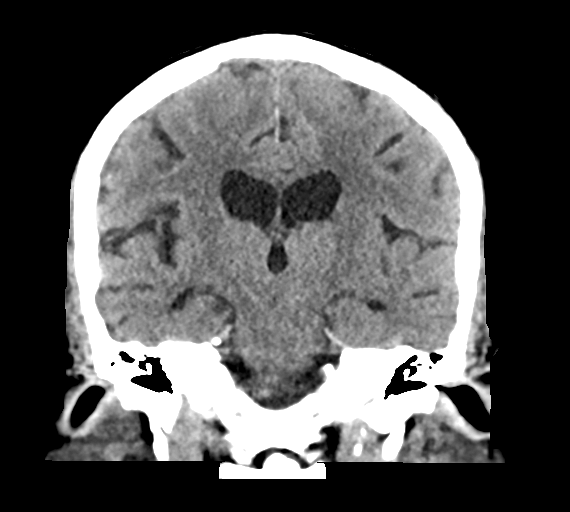
[im 42/76  brain]
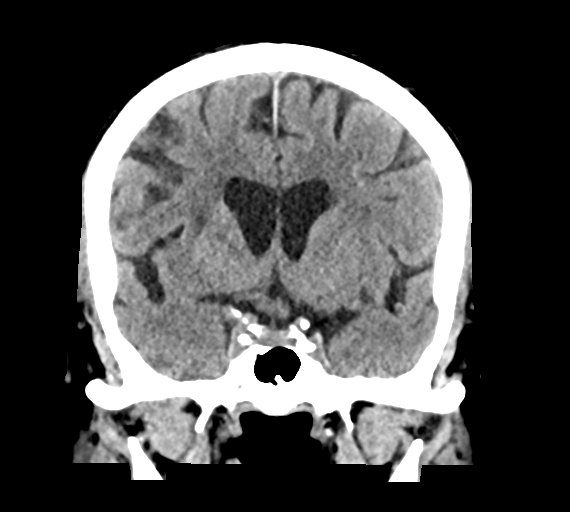

[Series 5: sag soft · sagittal · 0.36mm/px · 3 of 64 slices shown]
[im 22/64  brain]
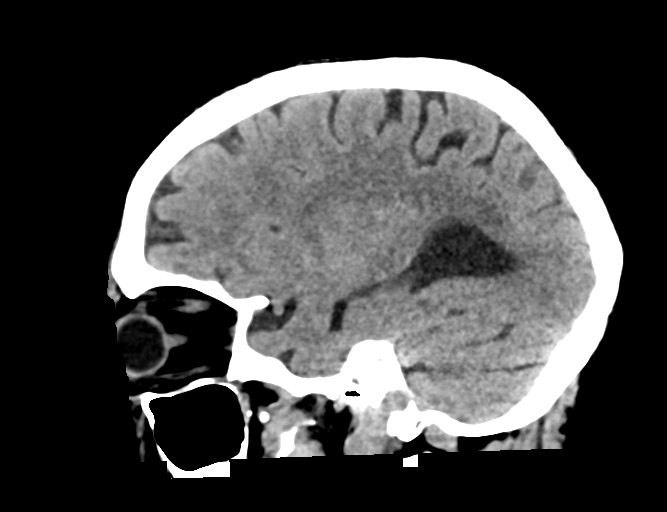
[im 32/64  brain]
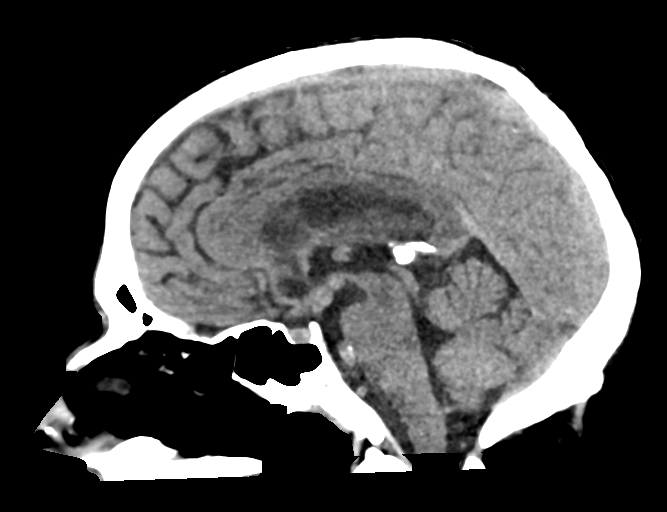
[im 43/64  brain]
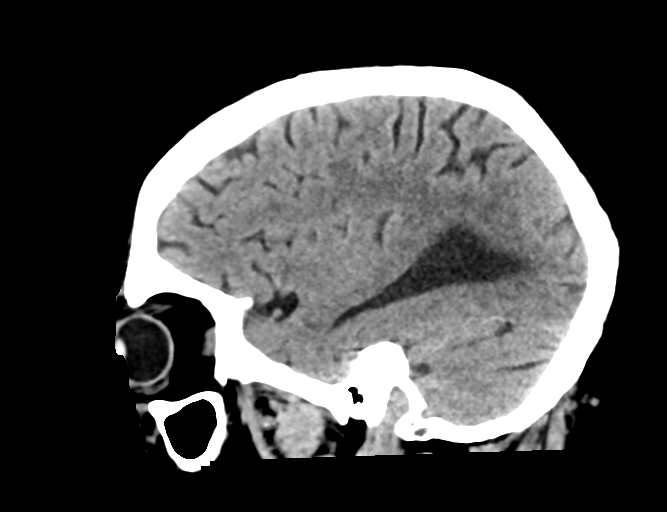

[16 of 47 positions shown; findings below may reference images not displayed]

FINDINGS: Brain: No evidence of acute infarction, hemorrhage, hydrocephalus,
extra-axial collection or mass lesion/mass effect. Small remote
lacunar infarct in the right basal ganglia. Mild low-density changes
within the periventricular and subcortical white matter compatible
with chronic microvascular ischemic change. Mild diffuse cerebral
volume loss.

Vascular: Atherosclerotic calcifications involving the large vessels
of the skull base. No unexpected hyperdense vessel.

Skull: Normal. Negative for fracture or focal lesion.

Sinuses/Orbits: Partial right mastoid effusion.

Other: None.
IMPRESSION: 1. No acute intracranial abnormality.
2. Partial right mastoid effusion.
3. Mild chronic microvascular ischemic change and cerebral volume
loss.

## 2023-02-07 ENCOUNTER — Ambulatory Visit: Payer: Medicare PPO | Admitting: Urology

## 2023-02-15 ENCOUNTER — Encounter (INDEPENDENT_AMBULATORY_CARE_PROVIDER_SITE_OTHER): Payer: Medicare PPO

## 2023-02-15 ENCOUNTER — Ambulatory Visit (INDEPENDENT_AMBULATORY_CARE_PROVIDER_SITE_OTHER): Payer: Medicare PPO | Admitting: Vascular Surgery
